# Patient Record
Sex: Female | Born: 1955 | Race: White | Hispanic: No | Marital: Single | Smoking: Never smoker
Health system: Southern US, Community
[De-identification: ages and names within clinical notes are randomized; demographics above are authoritative.]

## PROBLEM LIST (undated history)

## (undated) DIAGNOSIS — IMO0001 Reserved for inherently not codable concepts without codable children: Secondary | ICD-10-CM

## (undated) DIAGNOSIS — I209 Angina pectoris, unspecified: Secondary | ICD-10-CM

## (undated) DIAGNOSIS — K5900 Constipation, unspecified: Secondary | ICD-10-CM

## (undated) DIAGNOSIS — T4145XA Adverse effect of unspecified anesthetic, initial encounter: Secondary | ICD-10-CM

## (undated) DIAGNOSIS — M1990 Unspecified osteoarthritis, unspecified site: Secondary | ICD-10-CM

## (undated) DIAGNOSIS — K259 Gastric ulcer, unspecified as acute or chronic, without hemorrhage or perforation: Secondary | ICD-10-CM

## (undated) DIAGNOSIS — Z8614 Personal history of Methicillin resistant Staphylococcus aureus infection: Secondary | ICD-10-CM

## (undated) DIAGNOSIS — F32A Depression, unspecified: Secondary | ICD-10-CM

## (undated) DIAGNOSIS — R0602 Shortness of breath: Secondary | ICD-10-CM

## (undated) DIAGNOSIS — Z5189 Encounter for other specified aftercare: Secondary | ICD-10-CM

## (undated) DIAGNOSIS — N19 Unspecified kidney failure: Secondary | ICD-10-CM

## (undated) DIAGNOSIS — E669 Obesity, unspecified: Secondary | ICD-10-CM

## (undated) DIAGNOSIS — F79 Unspecified intellectual disabilities: Secondary | ICD-10-CM

## (undated) DIAGNOSIS — F329 Major depressive disorder, single episode, unspecified: Secondary | ICD-10-CM

## (undated) DIAGNOSIS — J189 Pneumonia, unspecified organism: Secondary | ICD-10-CM

## (undated) DIAGNOSIS — G4730 Sleep apnea, unspecified: Secondary | ICD-10-CM

## (undated) DIAGNOSIS — Z950 Presence of cardiac pacemaker: Secondary | ICD-10-CM

## (undated) DIAGNOSIS — Z992 Dependence on renal dialysis: Secondary | ICD-10-CM

## (undated) DIAGNOSIS — R569 Unspecified convulsions: Secondary | ICD-10-CM

## (undated) DIAGNOSIS — Q851 Tuberous sclerosis: Secondary | ICD-10-CM

## (undated) DIAGNOSIS — K219 Gastro-esophageal reflux disease without esophagitis: Secondary | ICD-10-CM

## (undated) DIAGNOSIS — D649 Anemia, unspecified: Secondary | ICD-10-CM

## (undated) DIAGNOSIS — I10 Essential (primary) hypertension: Secondary | ICD-10-CM

## (undated) DIAGNOSIS — I509 Heart failure, unspecified: Secondary | ICD-10-CM

## (undated) DIAGNOSIS — T8859XA Other complications of anesthesia, initial encounter: Secondary | ICD-10-CM

## (undated) DIAGNOSIS — N289 Disorder of kidney and ureter, unspecified: Secondary | ICD-10-CM

## (undated) DIAGNOSIS — N186 End stage renal disease: Secondary | ICD-10-CM

## (undated) DIAGNOSIS — G40909 Epilepsy, unspecified, not intractable, without status epilepticus: Secondary | ICD-10-CM

## (undated) HISTORY — PX: PACEMAKER PLACEMENT: SHX43

## (undated) HISTORY — DX: Tuberous sclerosis: Q85.1

## (undated) HISTORY — DX: Personal history of Methicillin resistant Staphylococcus aureus infection: Z86.14

## (undated) HISTORY — DX: Unspecified convulsions: R56.9

## (undated) HISTORY — DX: Depression, unspecified: F32.A

## (undated) HISTORY — DX: Obesity, unspecified: E66.9

## (undated) HISTORY — PX: AV FISTULA PLACEMENT: SHX1204

## (undated) HISTORY — PX: VAS INS TUNN CV CATH 5 OR OLDER (ARMC HX): HXRAD1272

## (undated) HISTORY — PX: OTHER SURGICAL HISTORY: SHX169

## (undated) HISTORY — PX: NEPHRECTOMY: SHX65

## (undated) HISTORY — DX: Gastro-esophageal reflux disease without esophagitis: K21.9

## (undated) HISTORY — DX: Major depressive disorder, single episode, unspecified: F32.9

## (undated) HISTORY — PX: ARTERIOVENOUS GRAFT PLACEMENT: SUR1029

---
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
## 1978-03-03 HISTORY — PX: TONSILLECTOMY: SUR1361

## 1990-07-02 HISTORY — PX: CHOLECYSTECTOMY: SHX55

## 1998-01-25 ENCOUNTER — Emergency Department (HOSPITAL_COMMUNITY): Admission: EM | Admit: 1998-01-25 | Discharge: 1998-01-26 | Payer: Self-pay | Admitting: Emergency Medicine

## 1999-10-17 ENCOUNTER — Encounter: Admission: RE | Admit: 1999-10-17 | Discharge: 1999-10-17 | Payer: Self-pay | Admitting: Obstetrics & Gynecology

## 1999-11-14 ENCOUNTER — Encounter: Admission: RE | Admit: 1999-11-14 | Discharge: 1999-11-14 | Payer: Self-pay | Admitting: Obstetrics & Gynecology

## 1999-12-19 ENCOUNTER — Encounter: Admission: RE | Admit: 1999-12-19 | Discharge: 1999-12-19 | Payer: Self-pay | Admitting: Obstetrics

## 2001-05-15 ENCOUNTER — Encounter: Admission: RE | Admit: 2001-05-15 | Discharge: 2001-08-13 | Payer: Self-pay | Admitting: Internal Medicine

## 2001-09-08 ENCOUNTER — Encounter: Admission: RE | Admit: 2001-09-08 | Discharge: 2001-12-07 | Payer: Self-pay | Admitting: Internal Medicine

## 2002-02-17 ENCOUNTER — Encounter: Payer: Self-pay | Admitting: Internal Medicine

## 2002-02-17 ENCOUNTER — Encounter: Admission: RE | Admit: 2002-02-17 | Discharge: 2002-02-17 | Payer: Self-pay | Admitting: Internal Medicine

## 2002-06-01 ENCOUNTER — Ambulatory Visit (HOSPITAL_BASED_OUTPATIENT_CLINIC_OR_DEPARTMENT_OTHER): Admission: RE | Admit: 2002-06-01 | Discharge: 2002-06-01 | Payer: Self-pay | Admitting: Otolaryngology

## 2004-05-29 ENCOUNTER — Ambulatory Visit: Payer: Self-pay | Admitting: Internal Medicine

## 2004-06-29 ENCOUNTER — Ambulatory Visit: Payer: Self-pay | Admitting: Internal Medicine

## 2004-07-03 ENCOUNTER — Encounter: Admission: RE | Admit: 2004-07-03 | Discharge: 2004-07-03 | Payer: Self-pay | Admitting: Internal Medicine

## 2004-08-25 ENCOUNTER — Ambulatory Visit: Payer: Self-pay | Admitting: Internal Medicine

## 2004-10-20 ENCOUNTER — Emergency Department (HOSPITAL_COMMUNITY): Admission: EM | Admit: 2004-10-20 | Discharge: 2004-10-20 | Payer: Self-pay | Admitting: Emergency Medicine

## 2004-10-25 ENCOUNTER — Ambulatory Visit: Payer: Self-pay | Admitting: Internal Medicine

## 2004-11-23 ENCOUNTER — Ambulatory Visit: Payer: Self-pay | Admitting: Licensed Clinical Social Worker

## 2004-12-06 ENCOUNTER — Ambulatory Visit: Payer: Self-pay | Admitting: Licensed Clinical Social Worker

## 2004-12-20 ENCOUNTER — Ambulatory Visit: Payer: Self-pay | Admitting: Licensed Clinical Social Worker

## 2005-01-03 ENCOUNTER — Ambulatory Visit: Payer: Self-pay | Admitting: Licensed Clinical Social Worker

## 2005-01-17 ENCOUNTER — Ambulatory Visit: Payer: Self-pay | Admitting: Licensed Clinical Social Worker

## 2005-01-31 ENCOUNTER — Ambulatory Visit: Payer: Self-pay | Admitting: Licensed Clinical Social Worker

## 2005-02-06 ENCOUNTER — Ambulatory Visit: Payer: Self-pay | Admitting: Internal Medicine

## 2005-02-07 ENCOUNTER — Ambulatory Visit: Payer: Self-pay | Admitting: Licensed Clinical Social Worker

## 2005-02-16 ENCOUNTER — Ambulatory Visit: Payer: Self-pay | Admitting: Internal Medicine

## 2005-02-21 ENCOUNTER — Ambulatory Visit: Payer: Self-pay | Admitting: Licensed Clinical Social Worker

## 2005-03-07 ENCOUNTER — Ambulatory Visit: Payer: Self-pay | Admitting: Licensed Clinical Social Worker

## 2005-03-21 ENCOUNTER — Ambulatory Visit: Payer: Self-pay | Admitting: Licensed Clinical Social Worker

## 2005-04-04 ENCOUNTER — Ambulatory Visit: Payer: Self-pay | Admitting: Licensed Clinical Social Worker

## 2005-04-18 ENCOUNTER — Ambulatory Visit: Payer: Self-pay | Admitting: Licensed Clinical Social Worker

## 2005-05-09 ENCOUNTER — Ambulatory Visit: Payer: Self-pay | Admitting: Licensed Clinical Social Worker

## 2005-05-09 ENCOUNTER — Ambulatory Visit: Payer: Self-pay | Admitting: Internal Medicine

## 2005-05-24 ENCOUNTER — Ambulatory Visit: Payer: Self-pay | Admitting: Licensed Clinical Social Worker

## 2005-05-30 ENCOUNTER — Ambulatory Visit: Payer: Self-pay | Admitting: Licensed Clinical Social Worker

## 2005-06-13 ENCOUNTER — Ambulatory Visit: Payer: Self-pay | Admitting: Licensed Clinical Social Worker

## 2005-06-27 ENCOUNTER — Ambulatory Visit: Payer: Self-pay | Admitting: Licensed Clinical Social Worker

## 2005-07-18 ENCOUNTER — Ambulatory Visit: Payer: Self-pay | Admitting: Licensed Clinical Social Worker

## 2005-08-01 ENCOUNTER — Ambulatory Visit: Payer: Self-pay | Admitting: Licensed Clinical Social Worker

## 2005-08-08 ENCOUNTER — Ambulatory Visit: Payer: Self-pay | Admitting: Internal Medicine

## 2005-08-15 ENCOUNTER — Ambulatory Visit: Payer: Self-pay | Admitting: Licensed Clinical Social Worker

## 2005-08-29 ENCOUNTER — Ambulatory Visit: Payer: Self-pay | Admitting: Licensed Clinical Social Worker

## 2005-09-05 ENCOUNTER — Emergency Department (HOSPITAL_COMMUNITY): Admission: EM | Admit: 2005-09-05 | Discharge: 2005-09-05 | Payer: Self-pay | Admitting: *Deleted

## 2005-09-12 ENCOUNTER — Ambulatory Visit: Payer: Self-pay | Admitting: Licensed Clinical Social Worker

## 2005-09-13 ENCOUNTER — Ambulatory Visit: Payer: Self-pay | Admitting: Internal Medicine

## 2005-09-26 ENCOUNTER — Ambulatory Visit: Payer: Self-pay | Admitting: Licensed Clinical Social Worker

## 2005-10-10 ENCOUNTER — Ambulatory Visit: Payer: Self-pay | Admitting: Licensed Clinical Social Worker

## 2005-10-11 ENCOUNTER — Ambulatory Visit: Payer: Self-pay | Admitting: Internal Medicine

## 2005-10-16 ENCOUNTER — Ambulatory Visit: Payer: Self-pay | Admitting: Internal Medicine

## 2005-11-01 ENCOUNTER — Ambulatory Visit: Payer: Self-pay | Admitting: Licensed Clinical Social Worker

## 2005-11-06 ENCOUNTER — Ambulatory Visit: Payer: Self-pay | Admitting: Internal Medicine

## 2005-11-08 ENCOUNTER — Inpatient Hospital Stay (HOSPITAL_COMMUNITY): Admission: EM | Admit: 2005-11-08 | Discharge: 2005-11-14 | Payer: Self-pay | Admitting: Emergency Medicine

## 2005-11-08 ENCOUNTER — Ambulatory Visit: Payer: Self-pay | Admitting: Family Medicine

## 2005-11-09 ENCOUNTER — Encounter: Payer: Self-pay | Admitting: Vascular Surgery

## 2005-11-15 ENCOUNTER — Ambulatory Visit: Payer: Self-pay | Admitting: Licensed Clinical Social Worker

## 2005-11-19 ENCOUNTER — Ambulatory Visit: Payer: Self-pay | Admitting: Internal Medicine

## 2005-11-23 ENCOUNTER — Encounter: Admission: RE | Admit: 2005-11-23 | Discharge: 2005-11-23 | Payer: Self-pay | Admitting: Internal Medicine

## 2005-11-29 ENCOUNTER — Ambulatory Visit: Payer: Self-pay | Admitting: Licensed Clinical Social Worker

## 2005-11-30 ENCOUNTER — Ambulatory Visit: Payer: Self-pay | Admitting: Licensed Clinical Social Worker

## 2005-12-03 ENCOUNTER — Ambulatory Visit: Payer: Self-pay | Admitting: Licensed Clinical Social Worker

## 2005-12-17 ENCOUNTER — Encounter: Admission: RE | Admit: 2005-12-17 | Discharge: 2005-12-17 | Payer: Self-pay | Admitting: Gastroenterology

## 2005-12-19 ENCOUNTER — Ambulatory Visit: Payer: Self-pay | Admitting: Internal Medicine

## 2005-12-20 ENCOUNTER — Ambulatory Visit: Payer: Self-pay | Admitting: Licensed Clinical Social Worker

## 2005-12-27 ENCOUNTER — Ambulatory Visit: Payer: Self-pay | Admitting: Licensed Clinical Social Worker

## 2006-01-03 ENCOUNTER — Ambulatory Visit: Payer: Self-pay | Admitting: Licensed Clinical Social Worker

## 2006-01-08 ENCOUNTER — Ambulatory Visit: Payer: Self-pay | Admitting: Internal Medicine

## 2006-01-08 ENCOUNTER — Ambulatory Visit: Payer: Self-pay | Admitting: Licensed Clinical Social Worker

## 2006-01-28 ENCOUNTER — Ambulatory Visit (HOSPITAL_COMMUNITY): Admission: RE | Admit: 2006-01-28 | Discharge: 2006-01-28 | Payer: Self-pay | Admitting: Nephrology

## 2006-01-30 ENCOUNTER — Ambulatory Visit (HOSPITAL_COMMUNITY): Admission: RE | Admit: 2006-01-30 | Discharge: 2006-01-30 | Payer: Self-pay | Admitting: Nephrology

## 2006-02-01 ENCOUNTER — Ambulatory Visit (HOSPITAL_COMMUNITY): Admission: RE | Admit: 2006-02-01 | Discharge: 2006-02-01 | Payer: Self-pay | Admitting: Vascular Surgery

## 2006-02-09 ENCOUNTER — Emergency Department (HOSPITAL_COMMUNITY): Admission: EM | Admit: 2006-02-09 | Discharge: 2006-02-10 | Payer: Self-pay | Admitting: Emergency Medicine

## 2006-02-15 ENCOUNTER — Ambulatory Visit: Payer: Self-pay | Admitting: Internal Medicine

## 2006-02-23 ENCOUNTER — Emergency Department (HOSPITAL_COMMUNITY): Admission: EM | Admit: 2006-02-23 | Discharge: 2006-02-23 | Payer: Self-pay | Admitting: Emergency Medicine

## 2006-03-02 ENCOUNTER — Emergency Department (HOSPITAL_COMMUNITY): Admission: EM | Admit: 2006-03-02 | Discharge: 2006-03-02 | Payer: Self-pay | Admitting: Emergency Medicine

## 2006-03-08 ENCOUNTER — Ambulatory Visit: Payer: Self-pay | Admitting: Internal Medicine

## 2006-03-13 ENCOUNTER — Ambulatory Visit: Payer: Self-pay | Admitting: Licensed Clinical Social Worker

## 2006-03-27 ENCOUNTER — Ambulatory Visit: Payer: Self-pay | Admitting: Licensed Clinical Social Worker

## 2006-03-27 ENCOUNTER — Ambulatory Visit: Payer: Self-pay | Admitting: Internal Medicine

## 2006-04-05 ENCOUNTER — Ambulatory Visit: Payer: Self-pay | Admitting: Internal Medicine

## 2006-04-08 ENCOUNTER — Ambulatory Visit: Payer: Self-pay | Admitting: Licensed Clinical Social Worker

## 2006-04-12 ENCOUNTER — Ambulatory Visit (HOSPITAL_COMMUNITY): Admission: RE | Admit: 2006-04-12 | Discharge: 2006-04-12 | Payer: Self-pay | Admitting: Vascular Surgery

## 2006-04-15 ENCOUNTER — Ambulatory Visit (HOSPITAL_COMMUNITY): Admission: RE | Admit: 2006-04-15 | Discharge: 2006-04-15 | Payer: Self-pay | Admitting: Gastroenterology

## 2006-04-15 ENCOUNTER — Ambulatory Visit: Payer: Self-pay | Admitting: Internal Medicine

## 2006-04-15 ENCOUNTER — Inpatient Hospital Stay (HOSPITAL_COMMUNITY): Admission: EM | Admit: 2006-04-15 | Discharge: 2006-04-25 | Payer: Self-pay | Admitting: Emergency Medicine

## 2006-05-01 ENCOUNTER — Ambulatory Visit: Payer: Self-pay | Admitting: Internal Medicine

## 2006-05-01 ENCOUNTER — Emergency Department (HOSPITAL_COMMUNITY): Admission: EM | Admit: 2006-05-01 | Discharge: 2006-05-02 | Payer: Self-pay | Admitting: Emergency Medicine

## 2006-05-03 ENCOUNTER — Inpatient Hospital Stay (HOSPITAL_COMMUNITY): Admission: AD | Admit: 2006-05-03 | Discharge: 2006-05-10 | Payer: Self-pay | Admitting: Vascular Surgery

## 2006-05-03 ENCOUNTER — Encounter: Payer: Self-pay | Admitting: Vascular Surgery

## 2006-05-17 ENCOUNTER — Ambulatory Visit: Payer: Self-pay | Admitting: Internal Medicine

## 2006-05-19 ENCOUNTER — Emergency Department (HOSPITAL_COMMUNITY): Admission: EM | Admit: 2006-05-19 | Discharge: 2006-05-19 | Payer: Self-pay | Admitting: *Deleted

## 2006-05-19 ENCOUNTER — Emergency Department (HOSPITAL_COMMUNITY): Admission: EM | Admit: 2006-05-19 | Discharge: 2006-05-19 | Payer: Self-pay | Admitting: Emergency Medicine

## 2006-05-24 ENCOUNTER — Ambulatory Visit: Payer: Self-pay | Admitting: Licensed Clinical Social Worker

## 2006-05-27 ENCOUNTER — Ambulatory Visit: Payer: Self-pay | Admitting: Internal Medicine

## 2006-05-30 ENCOUNTER — Emergency Department (HOSPITAL_COMMUNITY): Admission: EM | Admit: 2006-05-30 | Discharge: 2006-05-30 | Payer: Self-pay | Admitting: Emergency Medicine

## 2006-06-05 ENCOUNTER — Ambulatory Visit: Payer: Self-pay | Admitting: Licensed Clinical Social Worker

## 2006-06-10 ENCOUNTER — Ambulatory Visit (HOSPITAL_COMMUNITY): Admission: RE | Admit: 2006-06-10 | Discharge: 2006-06-10 | Payer: Self-pay | Admitting: Vascular Surgery

## 2006-06-17 ENCOUNTER — Ambulatory Visit: Payer: Self-pay | Admitting: Licensed Clinical Social Worker

## 2006-07-01 ENCOUNTER — Ambulatory Visit: Payer: Self-pay | Admitting: Licensed Clinical Social Worker

## 2006-07-01 ENCOUNTER — Ambulatory Visit: Payer: Self-pay | Admitting: Internal Medicine

## 2006-07-04 IMAGING — US US RETROPERITONEAL COMPLETE
1 series · 10 of 10 positions shown · non-contrast
Comparison: none

CLINICAL DATA: Chronic renal failure.  Previous right nephrectomy for renal adenomas. 
RENAL ULTRASOUND:
Multiple scans of the region of the left kidney are made and are correlated with the previous CT scan of 02/17/02 from [REDACTED] and show the area of the kidney now to be very difficult to marginate on ultrasound and it appears that the numerous fatty tumors of the kidney are also now involving the left kidney and distorting the kidney.  The collecting system can be visualized but not the margins of the kidney which is markedly enlarged as was seen on the CT scan of 02/17/02.  The bladder is partially filled and appears normal. The right kidney is known to have been removed.

[Series 1: unknown · 0.32mm/px · 10 of 10 slices shown]
[im 1/10]
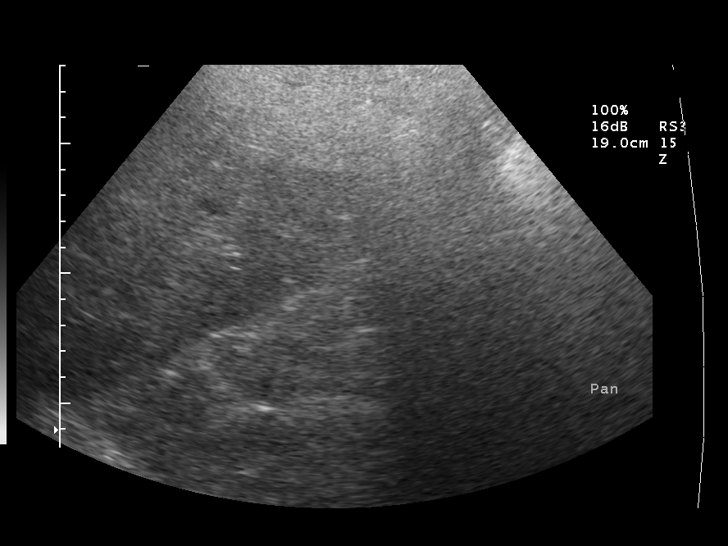
[im 2/10]
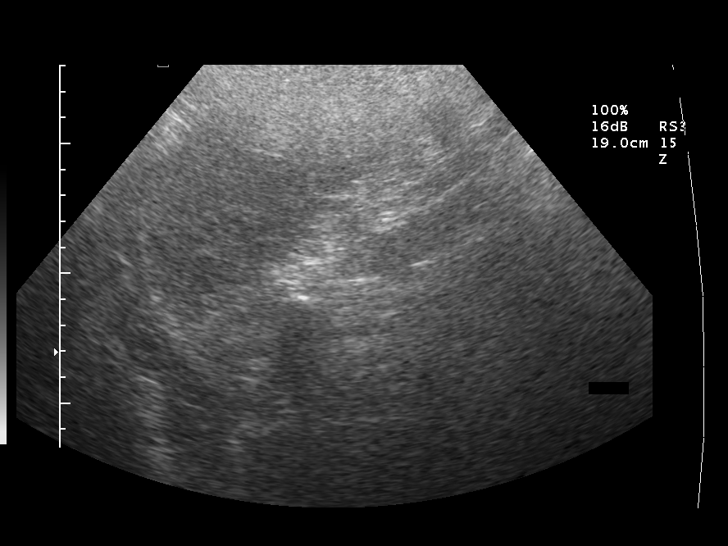
[im 3/10]
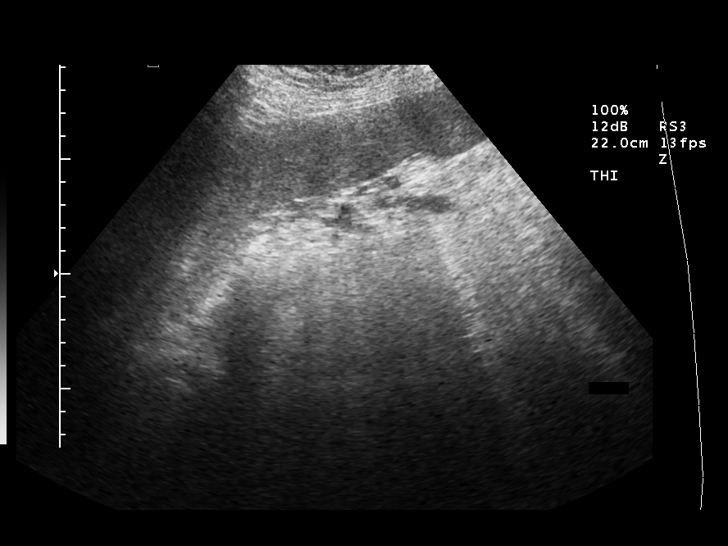
[im 4/10]
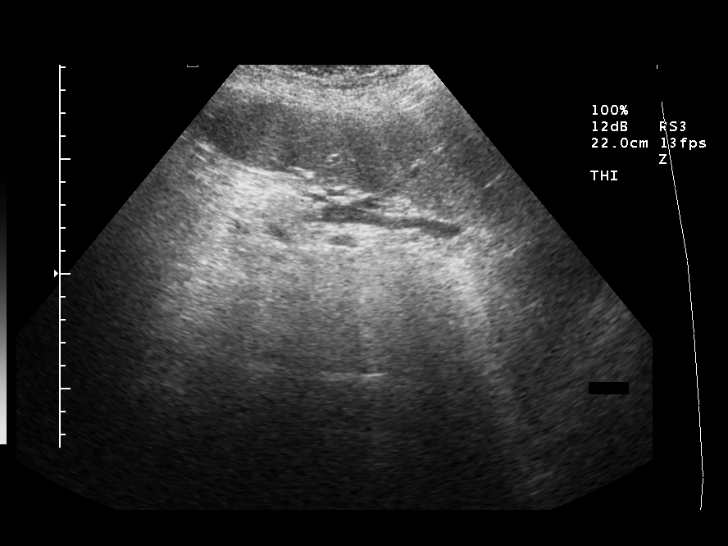
[im 5/10]
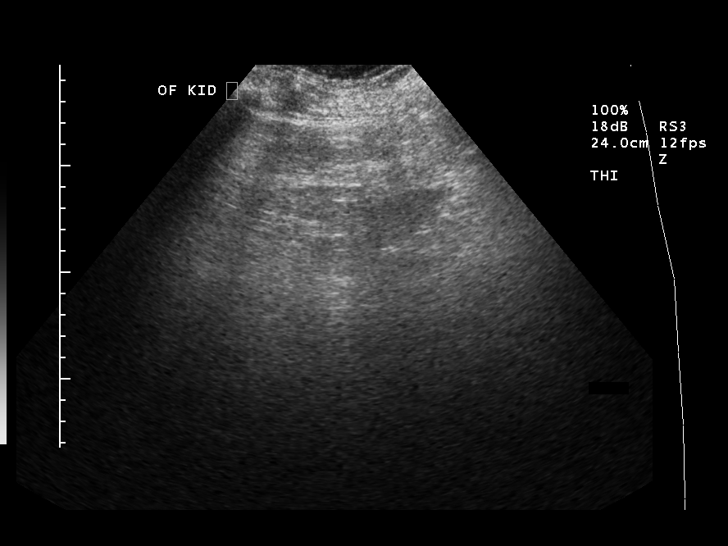
[im 6/10]
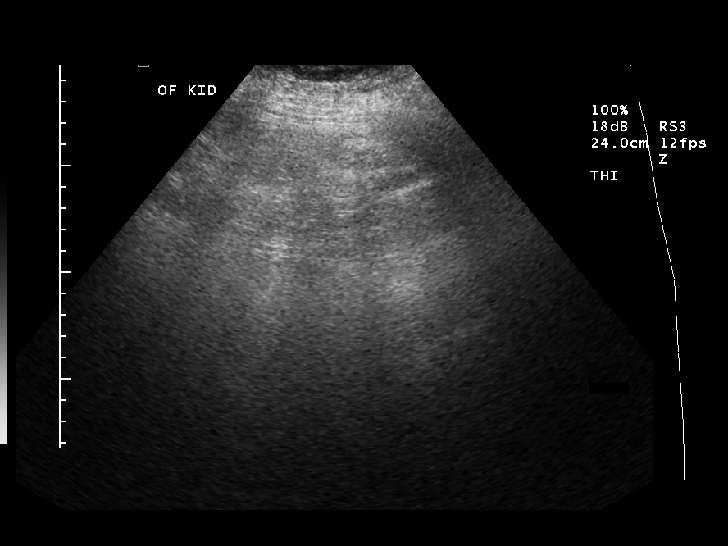
[im 7/10]
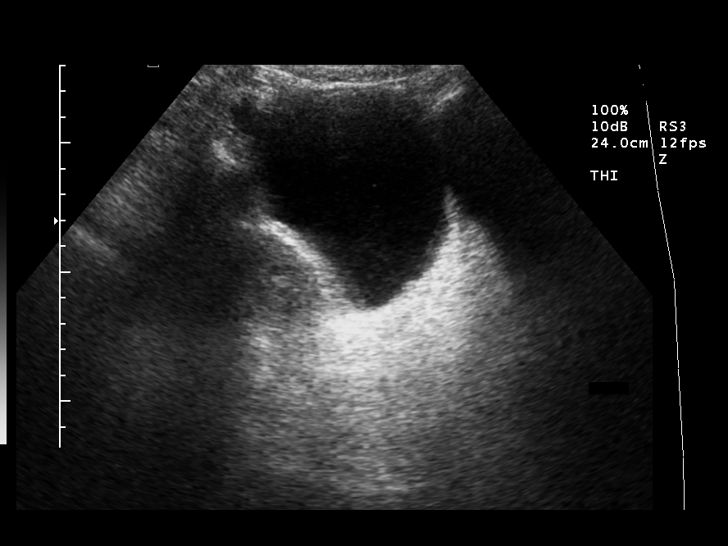
[im 8/10]
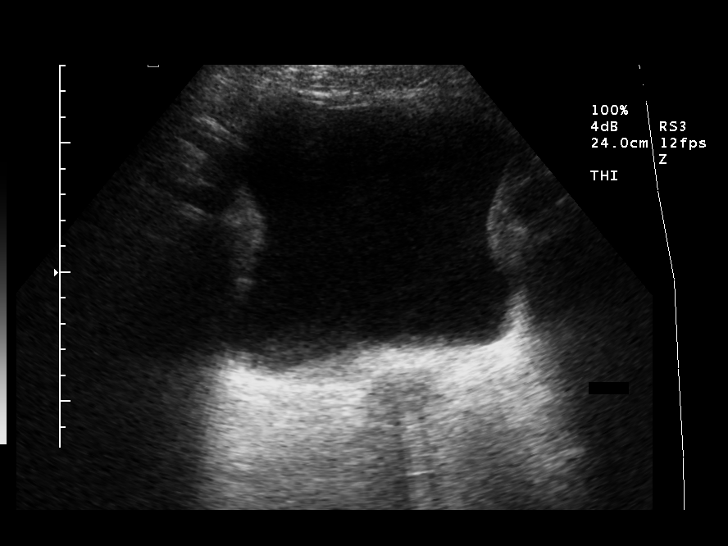
[im 9/10]
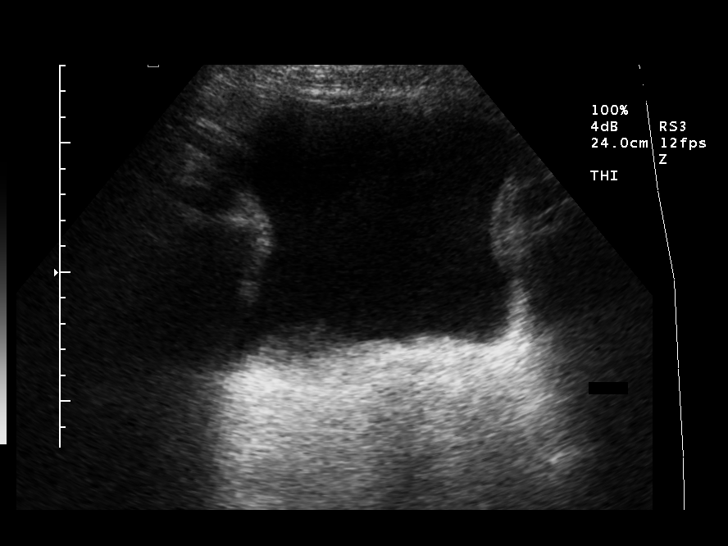
[im 10/10]
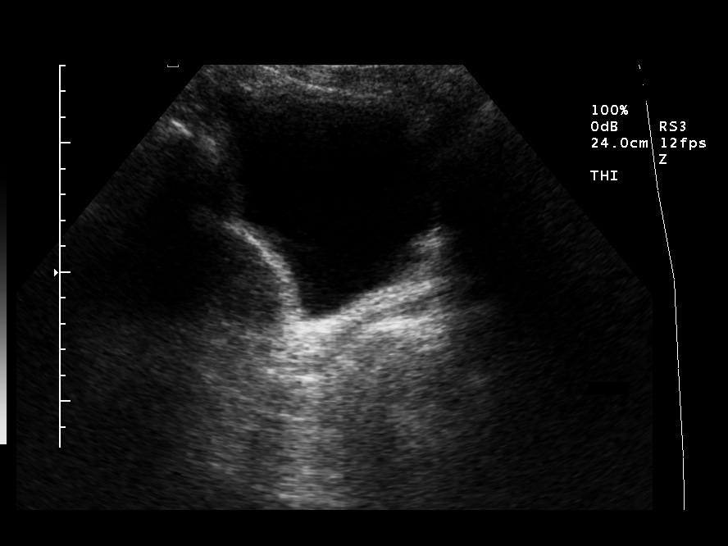

[10 of 10 positions shown; findings below may reference images not displayed]

IMPRESSION: Markedly distorted kidney with what appear to be numerous fatty tumors on the left side. The margins of the kidney cannot be measured but correlate relatively well with the CT scan of 02/17/02 from [REDACTED] and probably are multiple fatty adenoma lesions associated with the left kidney. There is no definite hydronephrosis or hydroureter.  The bladder is partially filled and appears normal.

## 2006-07-15 ENCOUNTER — Ambulatory Visit: Payer: Self-pay | Admitting: Licensed Clinical Social Worker

## 2006-07-29 ENCOUNTER — Ambulatory Visit: Payer: Self-pay | Admitting: Licensed Clinical Social Worker

## 2006-08-06 ENCOUNTER — Emergency Department (HOSPITAL_COMMUNITY): Admission: EM | Admit: 2006-08-06 | Discharge: 2006-08-07 | Payer: Self-pay | Admitting: Emergency Medicine

## 2006-08-12 ENCOUNTER — Ambulatory Visit: Payer: Self-pay | Admitting: Licensed Clinical Social Worker

## 2006-08-18 ENCOUNTER — Emergency Department (HOSPITAL_COMMUNITY): Admission: EM | Admit: 2006-08-18 | Discharge: 2006-08-18 | Payer: Self-pay | Admitting: Emergency Medicine

## 2006-08-26 ENCOUNTER — Ambulatory Visit: Payer: Self-pay | Admitting: Internal Medicine

## 2006-08-26 ENCOUNTER — Ambulatory Visit: Payer: Self-pay | Admitting: Licensed Clinical Social Worker

## 2006-08-31 ENCOUNTER — Emergency Department (HOSPITAL_COMMUNITY): Admission: EM | Admit: 2006-08-31 | Discharge: 2006-08-31 | Payer: Self-pay | Admitting: Emergency Medicine

## 2006-09-01 ENCOUNTER — Emergency Department (HOSPITAL_COMMUNITY): Admission: EM | Admit: 2006-09-01 | Discharge: 2006-09-01 | Payer: Self-pay | Admitting: Emergency Medicine

## 2006-09-04 ENCOUNTER — Ambulatory Visit (HOSPITAL_COMMUNITY): Admission: RE | Admit: 2006-09-04 | Discharge: 2006-09-04 | Payer: Self-pay | Admitting: Nephrology

## 2006-09-10 ENCOUNTER — Emergency Department (HOSPITAL_COMMUNITY): Admission: EM | Admit: 2006-09-10 | Discharge: 2006-09-11 | Payer: Self-pay | Admitting: Emergency Medicine

## 2006-09-10 ENCOUNTER — Encounter: Payer: Self-pay | Admitting: Internal Medicine

## 2006-09-16 ENCOUNTER — Ambulatory Visit (HOSPITAL_COMMUNITY): Admission: RE | Admit: 2006-09-16 | Discharge: 2006-09-16 | Payer: Self-pay | Admitting: Nephrology

## 2006-09-23 ENCOUNTER — Ambulatory Visit: Payer: Self-pay | Admitting: Licensed Clinical Social Worker

## 2006-09-30 ENCOUNTER — Ambulatory Visit: Payer: Self-pay | Admitting: Internal Medicine

## 2006-10-14 ENCOUNTER — Ambulatory Visit: Payer: Self-pay | Admitting: Licensed Clinical Social Worker

## 2006-10-28 ENCOUNTER — Ambulatory Visit: Payer: Self-pay | Admitting: Licensed Clinical Social Worker

## 2006-11-11 ENCOUNTER — Ambulatory Visit: Payer: Self-pay | Admitting: Licensed Clinical Social Worker

## 2006-11-22 ENCOUNTER — Ambulatory Visit: Payer: Self-pay | Admitting: Internal Medicine

## 2006-11-25 ENCOUNTER — Ambulatory Visit: Payer: Self-pay | Admitting: Licensed Clinical Social Worker

## 2006-12-06 ENCOUNTER — Ambulatory Visit (HOSPITAL_COMMUNITY): Admission: RE | Admit: 2006-12-06 | Discharge: 2006-12-06 | Payer: Self-pay | Admitting: Nephrology

## 2006-12-11 ENCOUNTER — Ambulatory Visit: Payer: Self-pay | Admitting: Licensed Clinical Social Worker

## 2006-12-11 ENCOUNTER — Ambulatory Visit: Payer: Self-pay | Admitting: Internal Medicine

## 2006-12-24 ENCOUNTER — Ambulatory Visit: Payer: Self-pay | Admitting: Vascular Surgery

## 2006-12-25 ENCOUNTER — Ambulatory Visit: Payer: Self-pay | Admitting: Licensed Clinical Social Worker

## 2006-12-27 ENCOUNTER — Ambulatory Visit: Payer: Self-pay | Admitting: Vascular Surgery

## 2006-12-28 ENCOUNTER — Inpatient Hospital Stay (HOSPITAL_COMMUNITY): Admission: RE | Admit: 2006-12-28 | Discharge: 2007-01-01 | Payer: Self-pay | Admitting: Vascular Surgery

## 2007-01-07 DIAGNOSIS — J45909 Unspecified asthma, uncomplicated: Secondary | ICD-10-CM

## 2007-01-07 DIAGNOSIS — G40209 Localization-related (focal) (partial) symptomatic epilepsy and epileptic syndromes with complex partial seizures, not intractable, without status epilepticus: Secondary | ICD-10-CM | POA: Insufficient documentation

## 2007-01-07 DIAGNOSIS — F79 Unspecified intellectual disabilities: Secondary | ICD-10-CM | POA: Insufficient documentation

## 2007-01-07 DIAGNOSIS — Q851 Tuberous sclerosis: Secondary | ICD-10-CM

## 2007-01-08 ENCOUNTER — Emergency Department (HOSPITAL_COMMUNITY): Admission: EM | Admit: 2007-01-08 | Discharge: 2007-01-09 | Payer: Self-pay | Admitting: Emergency Medicine

## 2007-01-08 ENCOUNTER — Ambulatory Visit: Payer: Self-pay | Admitting: Licensed Clinical Social Worker

## 2007-01-10 ENCOUNTER — Emergency Department (HOSPITAL_COMMUNITY): Admission: EM | Admit: 2007-01-10 | Discharge: 2007-01-11 | Payer: Self-pay | Admitting: Emergency Medicine

## 2007-01-13 ENCOUNTER — Emergency Department (HOSPITAL_COMMUNITY): Admission: EM | Admit: 2007-01-13 | Discharge: 2007-01-13 | Payer: Self-pay | Admitting: Emergency Medicine

## 2007-01-14 ENCOUNTER — Ambulatory Visit (HOSPITAL_COMMUNITY): Admission: RE | Admit: 2007-01-14 | Discharge: 2007-01-14 | Payer: Self-pay | Admitting: Vascular Surgery

## 2007-01-22 ENCOUNTER — Ambulatory Visit: Payer: Self-pay | Admitting: Internal Medicine

## 2007-01-22 ENCOUNTER — Encounter: Payer: Self-pay | Admitting: Internal Medicine

## 2007-01-22 ENCOUNTER — Ambulatory Visit: Payer: Self-pay | Admitting: Licensed Clinical Social Worker

## 2007-01-22 DIAGNOSIS — N186 End stage renal disease: Secondary | ICD-10-CM

## 2007-01-30 ENCOUNTER — Emergency Department (HOSPITAL_COMMUNITY): Admission: EM | Admit: 2007-01-30 | Discharge: 2007-01-30 | Payer: Self-pay | Admitting: Emergency Medicine

## 2007-02-05 ENCOUNTER — Ambulatory Visit: Payer: Self-pay | Admitting: Licensed Clinical Social Worker

## 2007-02-06 ENCOUNTER — Emergency Department (HOSPITAL_COMMUNITY): Admission: EM | Admit: 2007-02-06 | Discharge: 2007-02-06 | Payer: Self-pay | Admitting: Emergency Medicine

## 2007-02-19 ENCOUNTER — Ambulatory Visit: Payer: Self-pay | Admitting: Licensed Clinical Social Worker

## 2007-02-22 ENCOUNTER — Emergency Department (HOSPITAL_COMMUNITY): Admission: EM | Admit: 2007-02-22 | Discharge: 2007-02-22 | Payer: Self-pay | Admitting: Emergency Medicine

## 2007-02-24 ENCOUNTER — Telehealth: Payer: Self-pay | Admitting: Internal Medicine

## 2007-03-03 ENCOUNTER — Ambulatory Visit: Payer: Self-pay | Admitting: Internal Medicine

## 2007-03-03 ENCOUNTER — Ambulatory Visit: Payer: Self-pay | Admitting: Licensed Clinical Social Worker

## 2007-03-16 ENCOUNTER — Ambulatory Visit (HOSPITAL_COMMUNITY): Admission: EM | Admit: 2007-03-16 | Discharge: 2007-03-16 | Payer: Self-pay | Admitting: Emergency Medicine

## 2007-03-17 ENCOUNTER — Ambulatory Visit (HOSPITAL_COMMUNITY): Admission: RE | Admit: 2007-03-17 | Discharge: 2007-03-17 | Payer: Self-pay | Admitting: Surgery

## 2007-03-17 ENCOUNTER — Ambulatory Visit: Payer: Self-pay | Admitting: Surgery

## 2007-03-18 ENCOUNTER — Emergency Department (HOSPITAL_COMMUNITY): Admission: EM | Admit: 2007-03-18 | Discharge: 2007-03-19 | Payer: Self-pay | Admitting: Emergency Medicine

## 2007-03-18 ENCOUNTER — Ambulatory Visit (HOSPITAL_COMMUNITY): Admission: RE | Admit: 2007-03-18 | Discharge: 2007-03-18 | Payer: Self-pay | Admitting: Surgery

## 2007-03-20 ENCOUNTER — Telehealth: Payer: Self-pay | Admitting: Internal Medicine

## 2007-03-22 ENCOUNTER — Emergency Department (HOSPITAL_COMMUNITY): Admission: EM | Admit: 2007-03-22 | Discharge: 2007-03-24 | Payer: Self-pay | Admitting: Emergency Medicine

## 2007-03-26 ENCOUNTER — Ambulatory Visit: Payer: Self-pay | Admitting: Internal Medicine

## 2007-03-28 ENCOUNTER — Ambulatory Visit (HOSPITAL_COMMUNITY): Admission: RE | Admit: 2007-03-28 | Discharge: 2007-03-28 | Payer: Self-pay | Admitting: Surgery

## 2007-04-02 ENCOUNTER — Ambulatory Visit: Payer: Self-pay | Admitting: Licensed Clinical Social Worker

## 2007-04-14 ENCOUNTER — Ambulatory Visit: Payer: Self-pay | Admitting: Internal Medicine

## 2007-04-14 ENCOUNTER — Ambulatory Visit: Payer: Self-pay | Admitting: Licensed Clinical Social Worker

## 2007-04-14 DIAGNOSIS — K5901 Slow transit constipation: Secondary | ICD-10-CM | POA: Insufficient documentation

## 2007-04-25 ENCOUNTER — Ambulatory Visit (HOSPITAL_COMMUNITY): Admission: RE | Admit: 2007-04-25 | Discharge: 2007-04-25 | Payer: Self-pay | Admitting: Nephrology

## 2007-04-28 ENCOUNTER — Ambulatory Visit: Payer: Self-pay | Admitting: Vascular Surgery

## 2007-04-28 ENCOUNTER — Ambulatory Visit (HOSPITAL_COMMUNITY): Admission: RE | Admit: 2007-04-28 | Discharge: 2007-04-28 | Payer: Self-pay | Admitting: Vascular Surgery

## 2007-05-01 ENCOUNTER — Ambulatory Visit: Payer: Self-pay | Admitting: Internal Medicine

## 2007-05-02 ENCOUNTER — Ambulatory Visit: Payer: Self-pay | Admitting: Licensed Clinical Social Worker

## 2007-05-07 ENCOUNTER — Ambulatory Visit (HOSPITAL_COMMUNITY): Admission: RE | Admit: 2007-05-07 | Discharge: 2007-05-07 | Payer: Self-pay | Admitting: Vascular Surgery

## 2007-05-13 ENCOUNTER — Telehealth: Payer: Self-pay | Admitting: Internal Medicine

## 2007-05-14 ENCOUNTER — Ambulatory Visit: Payer: Self-pay | Admitting: Licensed Clinical Social Worker

## 2007-05-14 ENCOUNTER — Telehealth: Payer: Self-pay | Admitting: *Deleted

## 2007-05-16 ENCOUNTER — Ambulatory Visit: Payer: Self-pay | Admitting: Vascular Surgery

## 2007-05-20 ENCOUNTER — Telehealth (INDEPENDENT_AMBULATORY_CARE_PROVIDER_SITE_OTHER): Payer: Self-pay | Admitting: *Deleted

## 2007-05-21 ENCOUNTER — Ambulatory Visit (HOSPITAL_COMMUNITY): Admission: RE | Admit: 2007-05-21 | Discharge: 2007-05-21 | Payer: Self-pay | Admitting: Surgery

## 2007-05-22 ENCOUNTER — Ambulatory Visit: Payer: Self-pay | Admitting: Internal Medicine

## 2007-05-26 ENCOUNTER — Telehealth: Payer: Self-pay | Admitting: Internal Medicine

## 2007-05-28 ENCOUNTER — Ambulatory Visit: Payer: Self-pay | Admitting: Licensed Clinical Social Worker

## 2007-06-02 ENCOUNTER — Encounter: Payer: Self-pay | Admitting: Internal Medicine

## 2007-06-09 ENCOUNTER — Telehealth: Payer: Self-pay | Admitting: Internal Medicine

## 2007-06-10 ENCOUNTER — Ambulatory Visit: Payer: Self-pay | Admitting: Licensed Clinical Social Worker

## 2007-06-16 ENCOUNTER — Telehealth: Payer: Self-pay | Admitting: Internal Medicine

## 2007-06-18 ENCOUNTER — Ambulatory Visit: Payer: Self-pay | Admitting: Vascular Surgery

## 2007-06-23 ENCOUNTER — Ambulatory Visit (HOSPITAL_COMMUNITY): Admission: RE | Admit: 2007-06-23 | Discharge: 2007-06-23 | Payer: Self-pay | Admitting: Vascular Surgery

## 2007-06-23 ENCOUNTER — Ambulatory Visit: Payer: Self-pay | Admitting: Vascular Surgery

## 2007-06-24 ENCOUNTER — Telehealth: Payer: Self-pay | Admitting: Internal Medicine

## 2007-06-25 ENCOUNTER — Telehealth: Payer: Self-pay | Admitting: Family Medicine

## 2007-06-25 ENCOUNTER — Encounter: Payer: Self-pay | Admitting: Internal Medicine

## 2007-06-25 ENCOUNTER — Ambulatory Visit: Payer: Self-pay | Admitting: Licensed Clinical Social Worker

## 2007-06-25 ENCOUNTER — Ambulatory Visit: Payer: Self-pay | Admitting: Family Medicine

## 2007-06-25 DIAGNOSIS — IMO0002 Reserved for concepts with insufficient information to code with codable children: Secondary | ICD-10-CM

## 2007-06-26 ENCOUNTER — Telehealth: Payer: Self-pay | Admitting: Internal Medicine

## 2007-06-27 ENCOUNTER — Telehealth: Payer: Self-pay | Admitting: Internal Medicine

## 2007-06-27 ENCOUNTER — Ambulatory Visit: Payer: Self-pay | Admitting: Vascular Surgery

## 2007-06-30 ENCOUNTER — Ambulatory Visit: Payer: Self-pay | Admitting: Internal Medicine

## 2007-06-30 DIAGNOSIS — K219 Gastro-esophageal reflux disease without esophagitis: Secondary | ICD-10-CM

## 2007-07-07 ENCOUNTER — Ambulatory Visit: Payer: Self-pay | Admitting: Licensed Clinical Social Worker

## 2007-07-08 ENCOUNTER — Telehealth: Payer: Self-pay | Admitting: Internal Medicine

## 2007-07-08 ENCOUNTER — Ambulatory Visit: Payer: Self-pay | Admitting: Internal Medicine

## 2007-07-14 ENCOUNTER — Ambulatory Visit: Payer: Self-pay | Admitting: Internal Medicine

## 2007-07-15 ENCOUNTER — Telehealth (INDEPENDENT_AMBULATORY_CARE_PROVIDER_SITE_OTHER): Payer: Self-pay | Admitting: *Deleted

## 2007-07-22 ENCOUNTER — Emergency Department (HOSPITAL_COMMUNITY): Admission: EM | Admit: 2007-07-22 | Discharge: 2007-07-22 | Payer: Self-pay | Admitting: Emergency Medicine

## 2007-07-23 ENCOUNTER — Ambulatory Visit: Payer: Self-pay | Admitting: Licensed Clinical Social Worker

## 2007-07-23 ENCOUNTER — Telehealth: Payer: Self-pay | Admitting: Internal Medicine

## 2007-07-30 ENCOUNTER — Ambulatory Visit (HOSPITAL_COMMUNITY): Admission: RE | Admit: 2007-07-30 | Discharge: 2007-07-30 | Payer: Self-pay | Admitting: Vascular Surgery

## 2007-07-30 ENCOUNTER — Ambulatory Visit: Payer: Self-pay | Admitting: *Deleted

## 2007-08-01 ENCOUNTER — Ambulatory Visit: Payer: Self-pay | Admitting: Family Medicine

## 2007-08-01 DIAGNOSIS — B354 Tinea corporis: Secondary | ICD-10-CM | POA: Insufficient documentation

## 2007-08-05 ENCOUNTER — Inpatient Hospital Stay (HOSPITAL_COMMUNITY): Admission: RE | Admit: 2007-08-05 | Discharge: 2007-08-08 | Payer: Self-pay | Admitting: Nephrology

## 2007-08-11 ENCOUNTER — Ambulatory Visit: Payer: Self-pay | Admitting: Licensed Clinical Social Worker

## 2007-08-11 ENCOUNTER — Telehealth: Payer: Self-pay | Admitting: Internal Medicine

## 2007-08-11 ENCOUNTER — Telehealth: Payer: Self-pay | Admitting: *Deleted

## 2007-08-11 ENCOUNTER — Telehealth (INDEPENDENT_AMBULATORY_CARE_PROVIDER_SITE_OTHER): Payer: Self-pay | Admitting: *Deleted

## 2007-08-12 ENCOUNTER — Emergency Department (HOSPITAL_COMMUNITY): Admission: EM | Admit: 2007-08-12 | Discharge: 2007-08-12 | Payer: Self-pay | Admitting: Emergency Medicine

## 2007-08-13 ENCOUNTER — Telehealth: Payer: Self-pay | Admitting: Internal Medicine

## 2007-08-15 ENCOUNTER — Telehealth: Payer: Self-pay | Admitting: Internal Medicine

## 2007-08-18 ENCOUNTER — Ambulatory Visit: Payer: Self-pay | Admitting: Internal Medicine

## 2007-08-19 ENCOUNTER — Ambulatory Visit (HOSPITAL_COMMUNITY): Admission: RE | Admit: 2007-08-19 | Discharge: 2007-08-19 | Payer: Self-pay | Admitting: Interventional Radiology

## 2007-08-22 ENCOUNTER — Ambulatory Visit: Payer: Self-pay | Admitting: Internal Medicine

## 2007-08-22 DIAGNOSIS — L240 Irritant contact dermatitis due to detergents: Secondary | ICD-10-CM | POA: Insufficient documentation

## 2007-08-25 ENCOUNTER — Ambulatory Visit: Payer: Self-pay | Admitting: Licensed Clinical Social Worker

## 2007-08-29 ENCOUNTER — Ambulatory Visit: Payer: Self-pay | Admitting: Vascular Surgery

## 2007-09-01 ENCOUNTER — Ambulatory Visit: Payer: Self-pay | Admitting: Internal Medicine

## 2007-09-03 ENCOUNTER — Emergency Department (HOSPITAL_COMMUNITY): Admission: EM | Admit: 2007-09-03 | Discharge: 2007-09-03 | Payer: Self-pay | Admitting: Emergency Medicine

## 2007-09-09 ENCOUNTER — Ambulatory Visit: Payer: Self-pay | Admitting: Licensed Clinical Social Worker

## 2007-09-12 ENCOUNTER — Telehealth: Payer: Self-pay | Admitting: Internal Medicine

## 2007-09-12 ENCOUNTER — Emergency Department (HOSPITAL_COMMUNITY): Admission: EM | Admit: 2007-09-12 | Discharge: 2007-09-12 | Payer: Self-pay | Admitting: Emergency Medicine

## 2007-09-19 ENCOUNTER — Telehealth: Payer: Self-pay | Admitting: Internal Medicine

## 2007-09-20 ENCOUNTER — Emergency Department (HOSPITAL_COMMUNITY): Admission: EM | Admit: 2007-09-20 | Discharge: 2007-09-20 | Payer: Self-pay | Admitting: Emergency Medicine

## 2007-09-21 ENCOUNTER — Inpatient Hospital Stay (HOSPITAL_COMMUNITY): Admission: EM | Admit: 2007-09-21 | Discharge: 2007-09-30 | Payer: Self-pay | Admitting: Emergency Medicine

## 2007-09-22 ENCOUNTER — Encounter (INDEPENDENT_AMBULATORY_CARE_PROVIDER_SITE_OTHER): Payer: Self-pay | Admitting: Nephrology

## 2007-09-30 ENCOUNTER — Telehealth: Payer: Self-pay | Admitting: Internal Medicine

## 2007-10-03 ENCOUNTER — Emergency Department (HOSPITAL_COMMUNITY): Admission: EM | Admit: 2007-10-03 | Discharge: 2007-10-03 | Payer: Self-pay | Admitting: Emergency Medicine

## 2007-10-03 ENCOUNTER — Telehealth: Payer: Self-pay | Admitting: Internal Medicine

## 2007-10-05 ENCOUNTER — Emergency Department (HOSPITAL_COMMUNITY): Admission: EM | Admit: 2007-10-05 | Discharge: 2007-10-05 | Payer: Self-pay | Admitting: Emergency Medicine

## 2007-10-07 ENCOUNTER — Emergency Department (HOSPITAL_COMMUNITY): Admission: EM | Admit: 2007-10-07 | Discharge: 2007-10-08 | Payer: Self-pay | Admitting: Emergency Medicine

## 2007-10-07 ENCOUNTER — Emergency Department (HOSPITAL_COMMUNITY): Admission: EM | Admit: 2007-10-07 | Discharge: 2007-10-07 | Payer: Self-pay | Admitting: Emergency Medicine

## 2007-10-08 ENCOUNTER — Emergency Department (HOSPITAL_COMMUNITY): Admission: EM | Admit: 2007-10-08 | Discharge: 2007-10-08 | Payer: Self-pay | Admitting: Emergency Medicine

## 2007-10-08 ENCOUNTER — Telehealth: Payer: Self-pay | Admitting: Internal Medicine

## 2007-10-09 ENCOUNTER — Emergency Department (HOSPITAL_COMMUNITY): Admission: EM | Admit: 2007-10-09 | Discharge: 2007-10-09 | Payer: Self-pay | Admitting: Emergency Medicine

## 2007-10-09 ENCOUNTER — Telehealth: Payer: Self-pay | Admitting: Internal Medicine

## 2007-10-12 ENCOUNTER — Emergency Department (HOSPITAL_COMMUNITY): Admission: EM | Admit: 2007-10-12 | Discharge: 2007-10-13 | Payer: Self-pay | Admitting: Emergency Medicine

## 2007-10-13 ENCOUNTER — Ambulatory Visit: Payer: Self-pay | Admitting: Licensed Clinical Social Worker

## 2007-10-14 ENCOUNTER — Inpatient Hospital Stay (HOSPITAL_COMMUNITY): Admission: EM | Admit: 2007-10-14 | Discharge: 2007-10-15 | Payer: Self-pay | Admitting: Emergency Medicine

## 2007-10-14 ENCOUNTER — Emergency Department (HOSPITAL_COMMUNITY): Admission: EM | Admit: 2007-10-14 | Discharge: 2007-10-14 | Payer: Self-pay | Admitting: Emergency Medicine

## 2007-10-16 ENCOUNTER — Telehealth: Payer: Self-pay | Admitting: Internal Medicine

## 2007-10-16 ENCOUNTER — Emergency Department (HOSPITAL_COMMUNITY): Admission: EM | Admit: 2007-10-16 | Discharge: 2007-10-16 | Payer: Self-pay | Admitting: Emergency Medicine

## 2007-10-17 ENCOUNTER — Emergency Department (HOSPITAL_COMMUNITY): Admission: EM | Admit: 2007-10-17 | Discharge: 2007-10-17 | Payer: Self-pay | Admitting: Emergency Medicine

## 2007-10-20 ENCOUNTER — Telehealth: Payer: Self-pay | Admitting: Internal Medicine

## 2007-10-22 ENCOUNTER — Telehealth: Payer: Self-pay | Admitting: Internal Medicine

## 2007-10-27 ENCOUNTER — Ambulatory Visit: Payer: Self-pay | Admitting: Internal Medicine

## 2007-10-27 ENCOUNTER — Ambulatory Visit: Payer: Self-pay | Admitting: Licensed Clinical Social Worker

## 2007-10-27 DIAGNOSIS — J309 Allergic rhinitis, unspecified: Secondary | ICD-10-CM | POA: Insufficient documentation

## 2007-11-02 ENCOUNTER — Emergency Department (HOSPITAL_COMMUNITY): Admission: EM | Admit: 2007-11-02 | Discharge: 2007-11-03 | Payer: Self-pay | Admitting: Emergency Medicine

## 2007-11-03 ENCOUNTER — Telehealth: Payer: Self-pay | Admitting: Internal Medicine

## 2007-11-05 ENCOUNTER — Telehealth: Payer: Self-pay | Admitting: Internal Medicine

## 2007-11-06 ENCOUNTER — Emergency Department (HOSPITAL_COMMUNITY): Admission: EM | Admit: 2007-11-06 | Discharge: 2007-11-06 | Payer: Self-pay | Admitting: Emergency Medicine

## 2007-11-10 ENCOUNTER — Ambulatory Visit: Payer: Self-pay | Admitting: Licensed Clinical Social Worker

## 2007-11-13 ENCOUNTER — Encounter: Payer: Self-pay | Admitting: Internal Medicine

## 2007-11-19 ENCOUNTER — Telehealth: Payer: Self-pay | Admitting: Internal Medicine

## 2007-11-20 ENCOUNTER — Telehealth (INDEPENDENT_AMBULATORY_CARE_PROVIDER_SITE_OTHER): Payer: Self-pay | Admitting: *Deleted

## 2007-11-20 ENCOUNTER — Telehealth: Payer: Self-pay | Admitting: Internal Medicine

## 2007-11-21 ENCOUNTER — Telehealth: Payer: Self-pay | Admitting: Internal Medicine

## 2007-11-24 ENCOUNTER — Ambulatory Visit: Payer: Self-pay | Admitting: Licensed Clinical Social Worker

## 2007-11-25 ENCOUNTER — Telehealth: Payer: Self-pay | Admitting: Internal Medicine

## 2007-11-25 ENCOUNTER — Emergency Department (HOSPITAL_COMMUNITY): Admission: EM | Admit: 2007-11-25 | Discharge: 2007-11-25 | Payer: Self-pay | Admitting: Emergency Medicine

## 2007-11-28 ENCOUNTER — Telehealth: Payer: Self-pay | Admitting: Internal Medicine

## 2007-12-03 ENCOUNTER — Telehealth: Payer: Self-pay | Admitting: Internal Medicine

## 2007-12-11 ENCOUNTER — Inpatient Hospital Stay (HOSPITAL_COMMUNITY): Admission: EM | Admit: 2007-12-11 | Discharge: 2007-12-12 | Payer: Self-pay | Admitting: Emergency Medicine

## 2007-12-12 ENCOUNTER — Telehealth: Payer: Self-pay | Admitting: Internal Medicine

## 2007-12-15 ENCOUNTER — Ambulatory Visit: Payer: Self-pay | Admitting: Licensed Clinical Social Worker

## 2007-12-15 ENCOUNTER — Ambulatory Visit: Payer: Self-pay | Admitting: Internal Medicine

## 2007-12-15 DIAGNOSIS — D239 Other benign neoplasm of skin, unspecified: Secondary | ICD-10-CM | POA: Insufficient documentation

## 2007-12-25 ENCOUNTER — Emergency Department (HOSPITAL_COMMUNITY): Admission: EM | Admit: 2007-12-25 | Discharge: 2007-12-25 | Payer: Self-pay | Admitting: Emergency Medicine

## 2007-12-26 ENCOUNTER — Emergency Department (HOSPITAL_COMMUNITY): Admission: EM | Admit: 2007-12-26 | Discharge: 2007-12-27 | Payer: Self-pay | Admitting: Emergency Medicine

## 2007-12-27 ENCOUNTER — Inpatient Hospital Stay (HOSPITAL_COMMUNITY): Admission: EM | Admit: 2007-12-27 | Discharge: 2007-12-28 | Payer: Self-pay | Admitting: Emergency Medicine

## 2008-01-02 ENCOUNTER — Inpatient Hospital Stay (HOSPITAL_COMMUNITY): Admission: EM | Admit: 2008-01-02 | Discharge: 2008-01-06 | Payer: Self-pay | Admitting: Emergency Medicine

## 2008-01-02 ENCOUNTER — Ambulatory Visit: Payer: Self-pay | Admitting: Internal Medicine

## 2008-01-07 ENCOUNTER — Ambulatory Visit: Payer: Self-pay | Admitting: Licensed Clinical Social Worker

## 2008-01-08 ENCOUNTER — Emergency Department (HOSPITAL_COMMUNITY): Admission: EM | Admit: 2008-01-08 | Discharge: 2008-01-08 | Payer: Self-pay | Admitting: Emergency Medicine

## 2008-01-08 ENCOUNTER — Ambulatory Visit: Payer: Self-pay | Admitting: Internal Medicine

## 2008-01-08 DIAGNOSIS — M159 Polyosteoarthritis, unspecified: Secondary | ICD-10-CM | POA: Insufficient documentation

## 2008-01-11 ENCOUNTER — Emergency Department (HOSPITAL_COMMUNITY): Admission: EM | Admit: 2008-01-11 | Discharge: 2008-01-11 | Payer: Self-pay | Admitting: Emergency Medicine

## 2008-01-13 ENCOUNTER — Emergency Department (HOSPITAL_COMMUNITY): Admission: EM | Admit: 2008-01-13 | Discharge: 2008-01-13 | Payer: Self-pay | Admitting: Emergency Medicine

## 2008-01-14 ENCOUNTER — Emergency Department (HOSPITAL_COMMUNITY): Admission: EM | Admit: 2008-01-14 | Discharge: 2008-01-14 | Payer: Self-pay | Admitting: Emergency Medicine

## 2008-01-15 ENCOUNTER — Emergency Department (HOSPITAL_COMMUNITY): Admission: EM | Admit: 2008-01-15 | Discharge: 2008-01-15 | Payer: Self-pay | Admitting: Unknown Physician Specialty

## 2008-01-23 ENCOUNTER — Ambulatory Visit: Payer: Self-pay | Admitting: Licensed Clinical Social Worker

## 2008-01-31 IMAGING — XA IR CENTRAL VENOUS CATHETER
1 series · 13 of 24 positions shown · non-contrast
Comparison: none

CLINICAL DATA: Catheter for TPA infusion.
 LEFT IJ VEIN DIALYSIS CATHETER TPA INFUSION: 
 LEFT IJ VEIN TUNNELED DIALYSIS CATHETER CONTRAST INJECTION: 
 Procedure:  Utilizing sterile technique, contrast was injected through each port of the left IJ vein tunneled dialysis catheter for digital subtraction angiography.  No complications.

[Series 1: run · 13 of 25 slices shown]
[im 1/25]
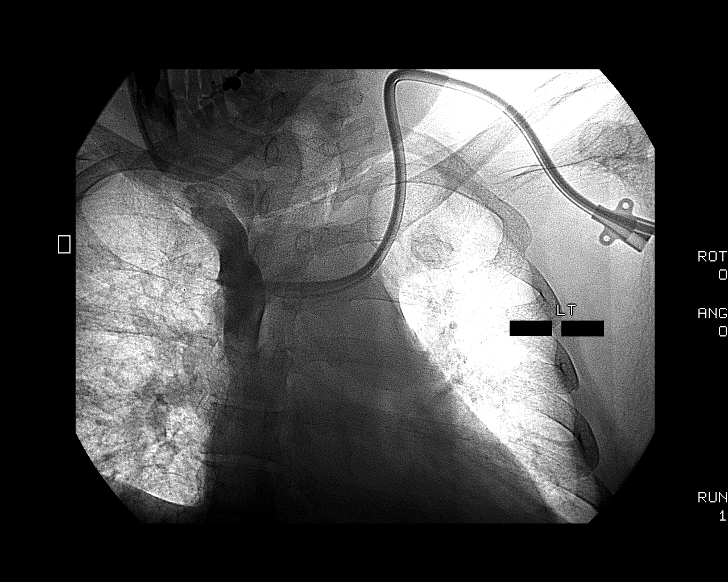
[im 3/25]
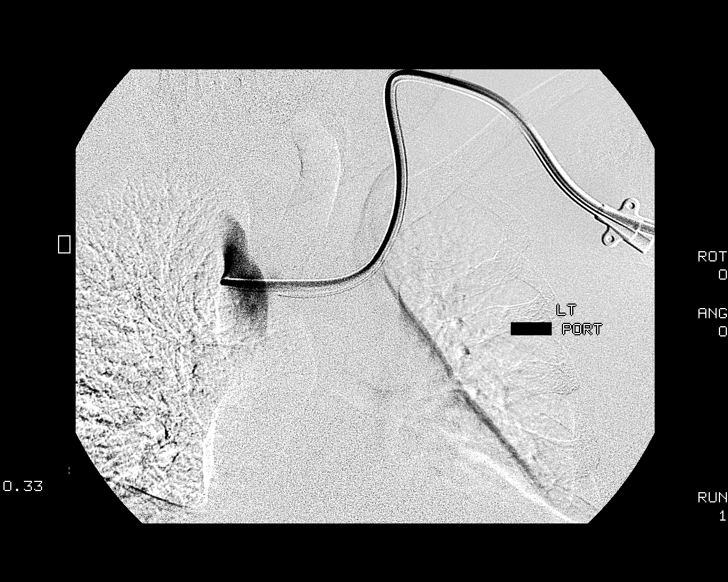
[im 5/25]
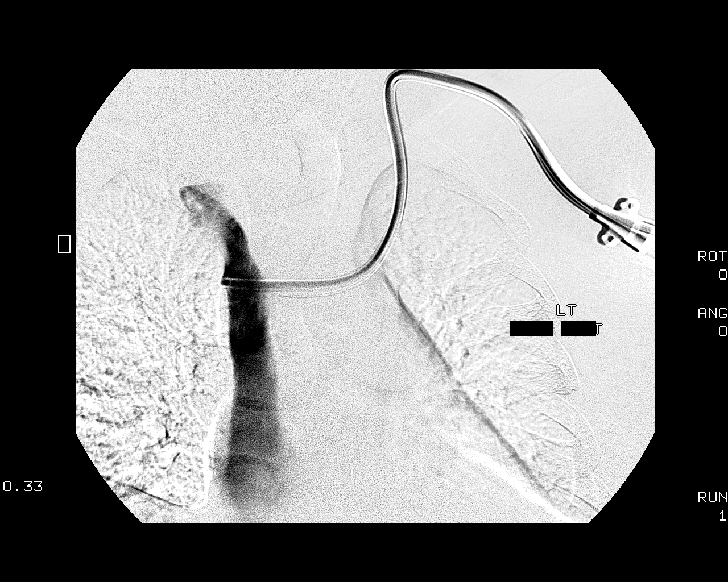
[im 7/25]
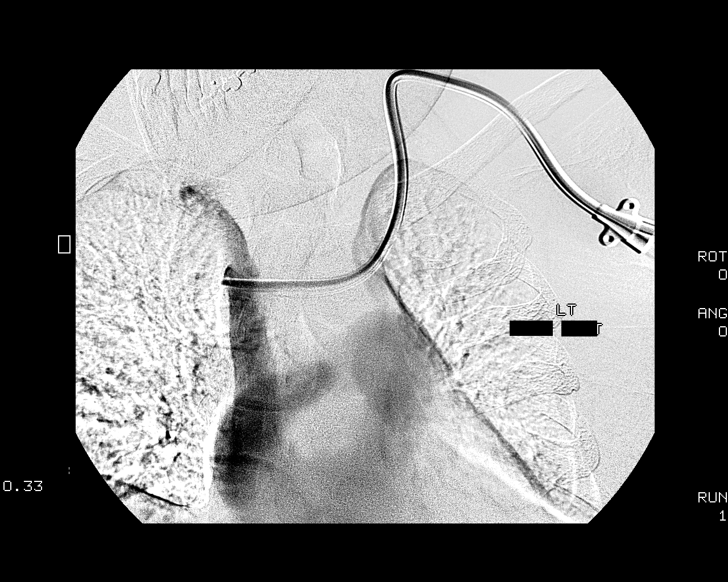
[im 9/25]
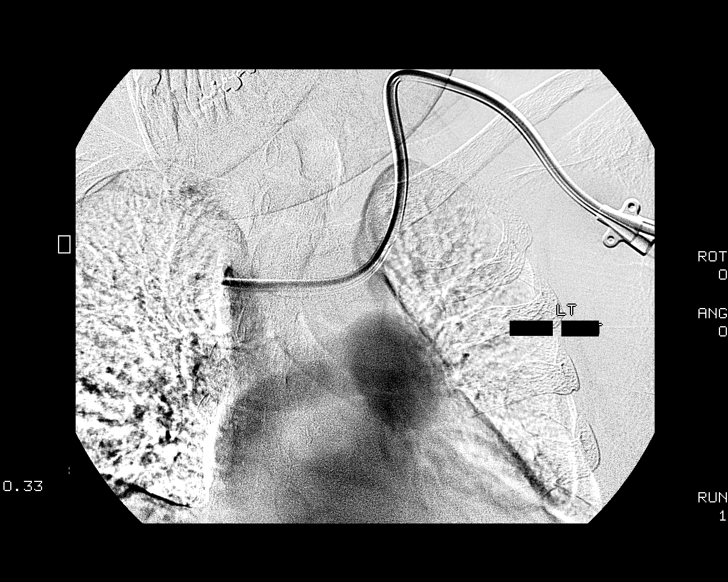
[im 11/25]
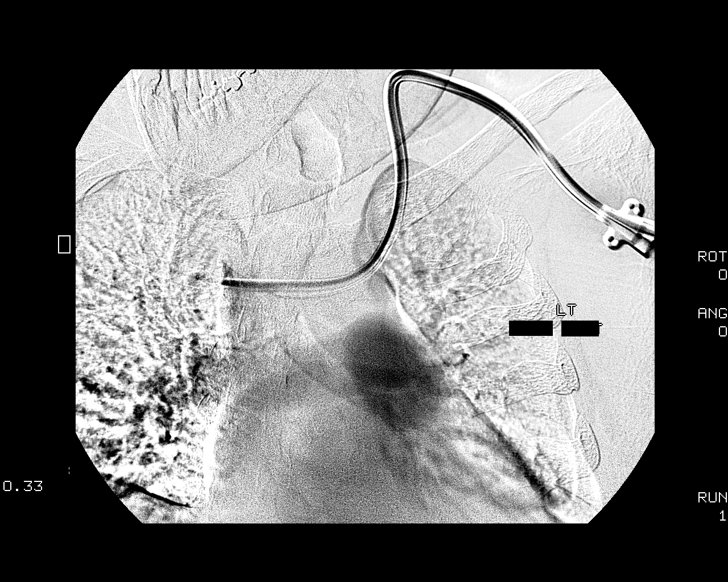
[im 13/25]
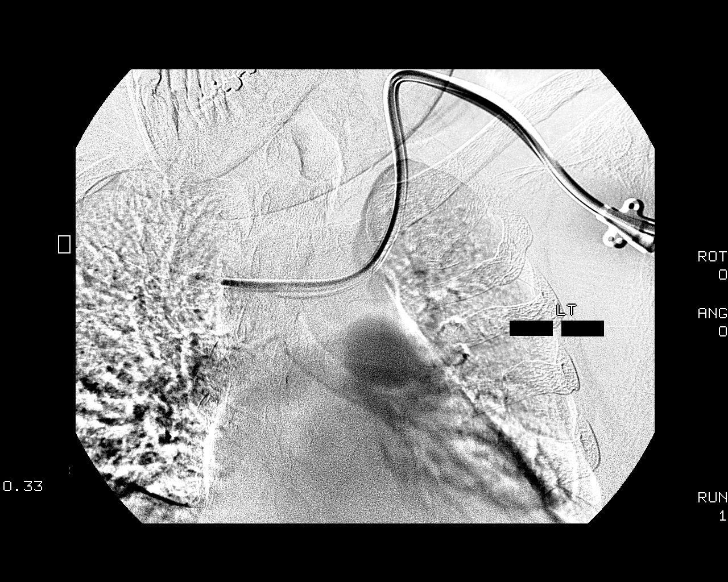
[im 14/25]
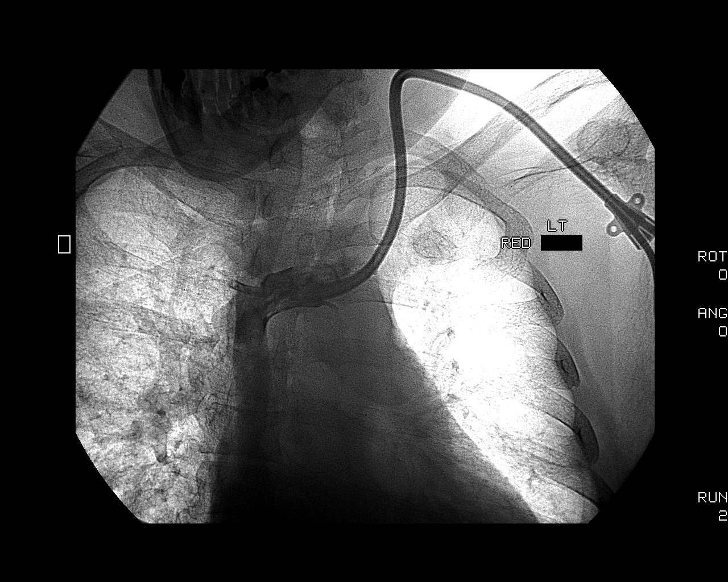
[im 16/25]
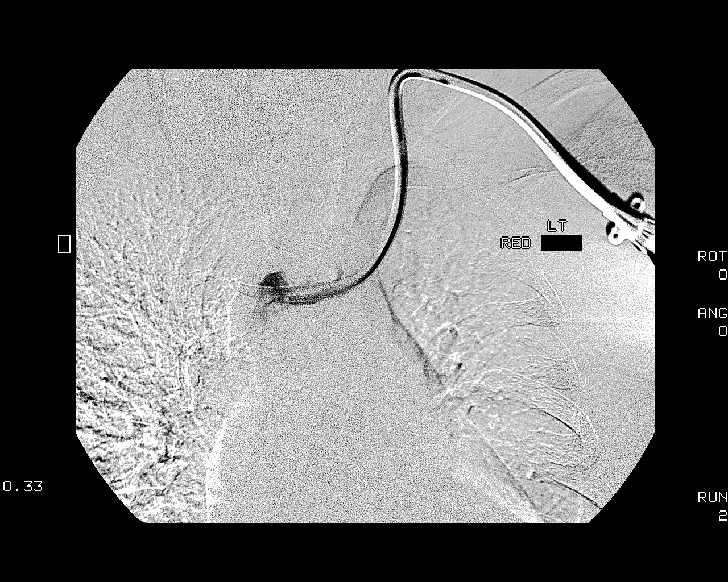
[im 18/25]
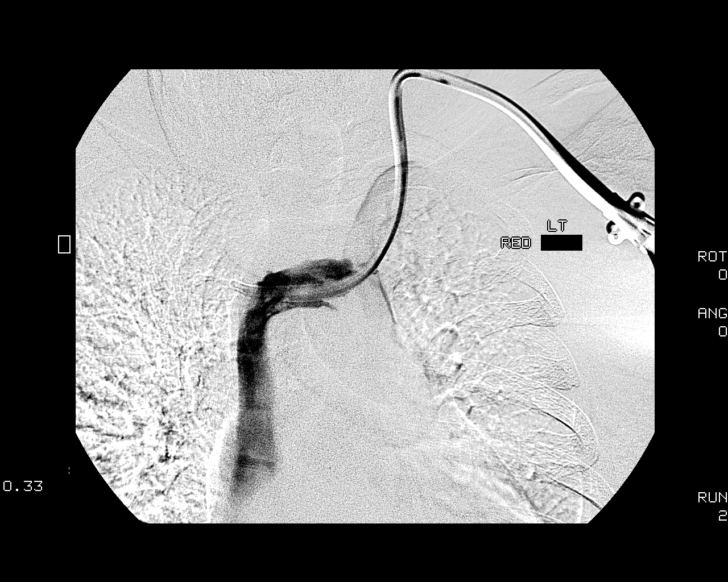
[im 20/25]
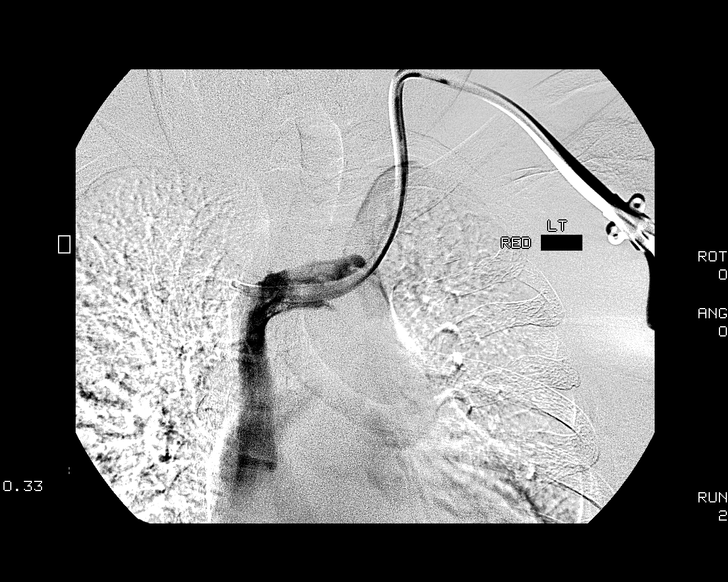
[im 22/25]
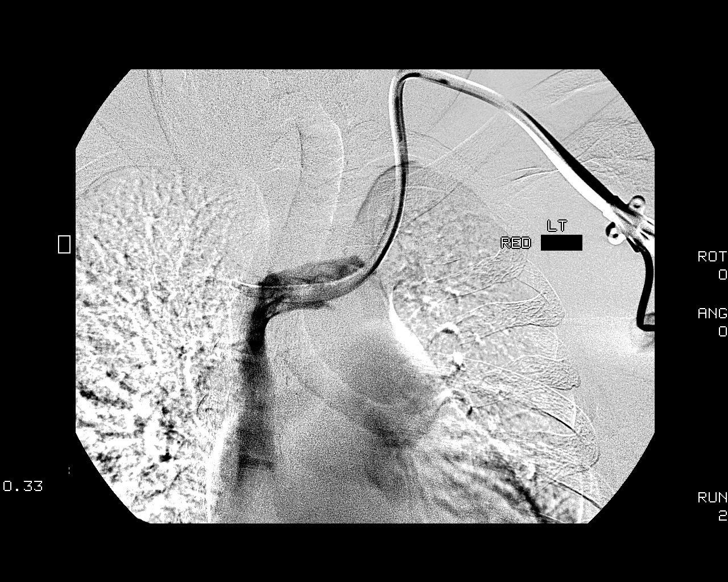
[im 25/25]
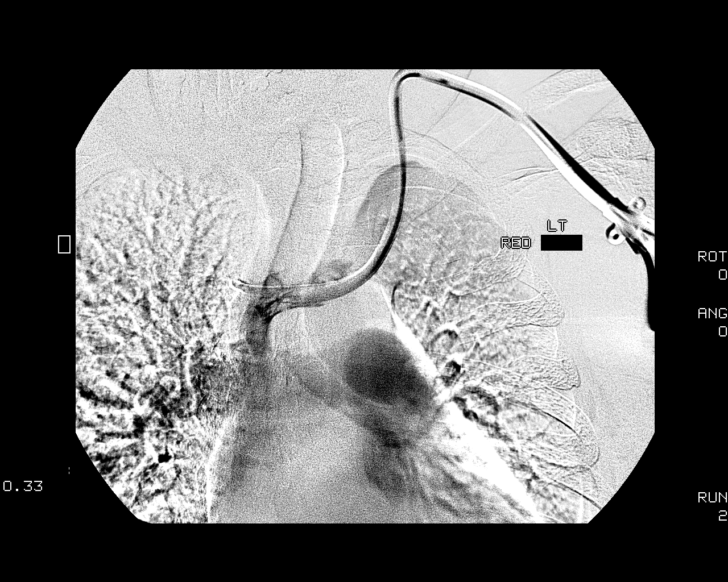

[13 of 24 positions shown; findings below may reference images not displayed]

FINDINGS: The longer port tip is in the upper SVC at the confluence of the right and left innominate veins.  The tip is against the right sidewall of the vein.  The shorted catheter tip is in the left innominate vein.  Fibrin sheath is noted around the shorter tip.  There probably is some fibrin impeding flow for the longer tip, as well, given its position against the sidewall.
IMPRESSION: Dialysis catheter positioned as described with fibrin sheath.  The patient was transferred to the outpatient area in stable condition for TPA infusion.

## 2008-02-02 ENCOUNTER — Ambulatory Visit: Payer: Self-pay | Admitting: Licensed Clinical Social Worker

## 2008-02-02 IMAGING — CR DG CHEST 1V
1 series · 1 of 1 positions shown · non-contrast
Comparison: none

CLINICAL DATA: ESRD, pre-op.  History of asthma, shortness of breath.   Left nephrectomy.  [facility].
 CHEST ? 1 VIEW:

[view not recorded]
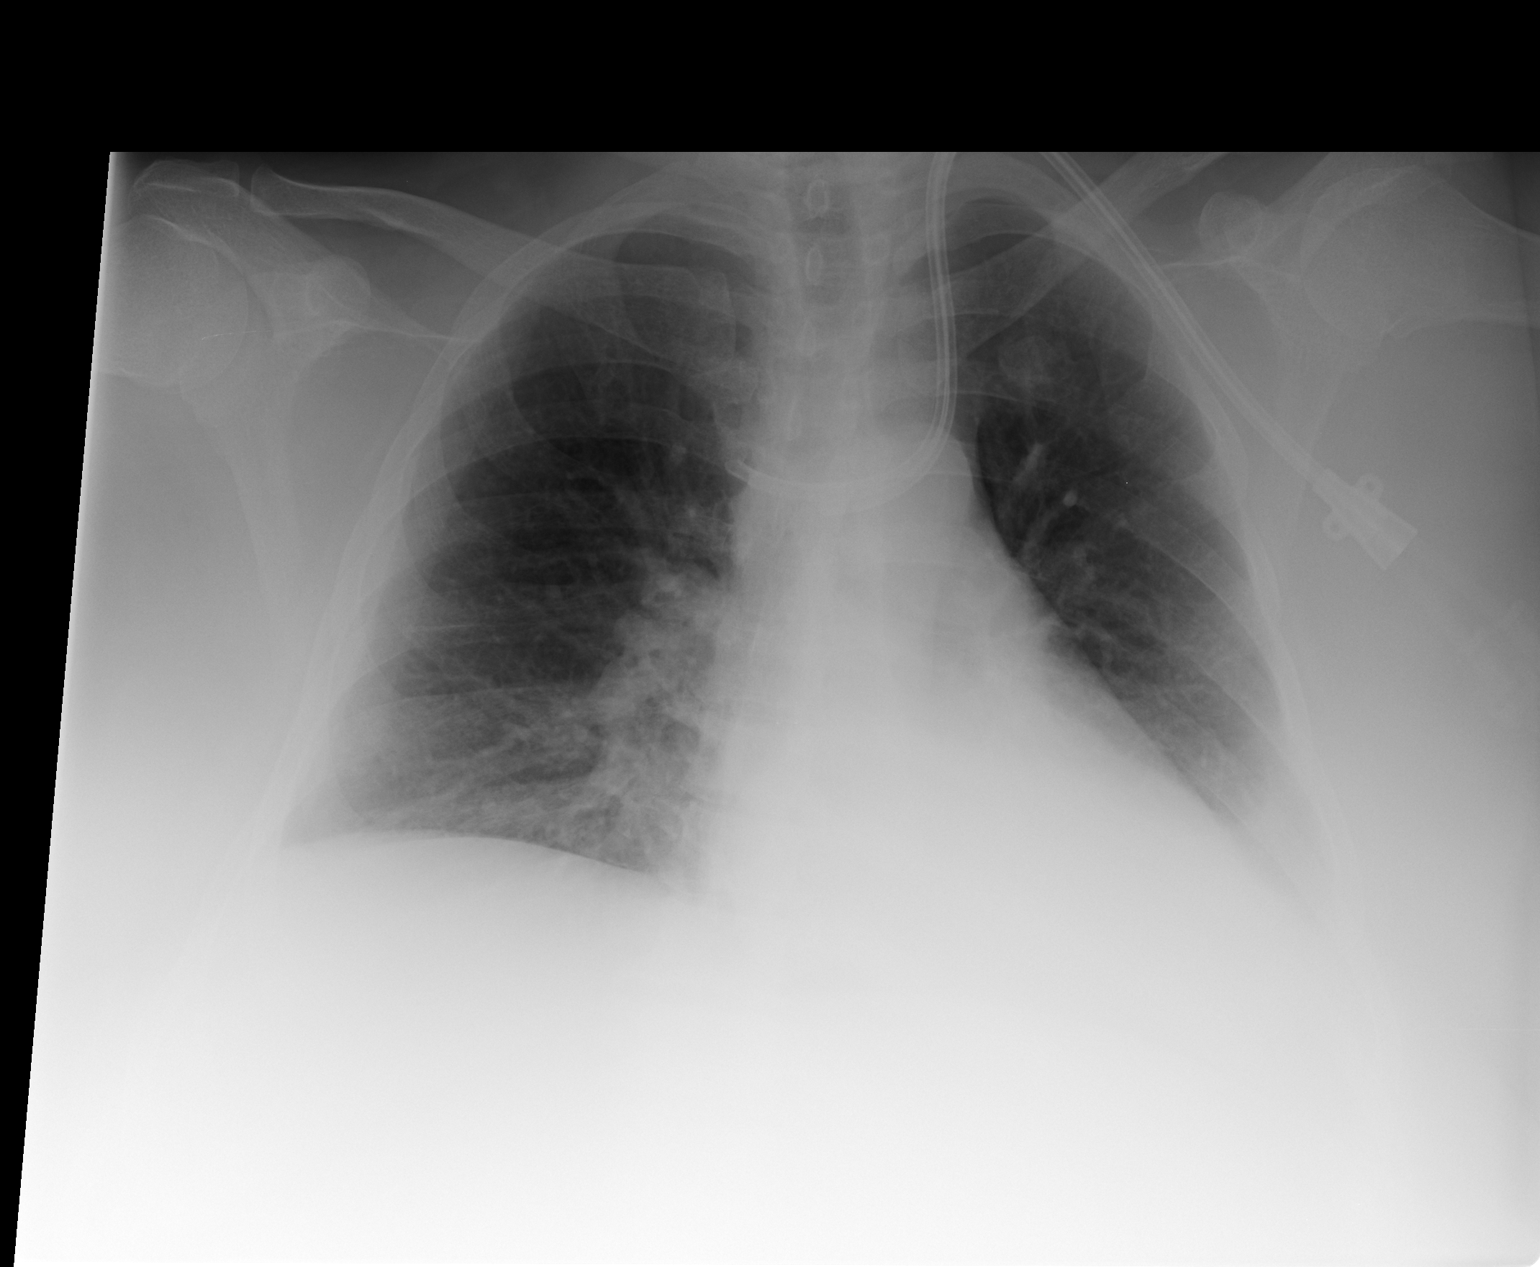

[1 of 1 positions shown; findings below may reference images not displayed]

FINDINGS: Central venous catheter enters via left jugular vein approach.  The tip is in the proximal aspect of the SVC.  Enlarged cardiopericardial silhouette and pulmonary vascular congestion.  No frank pulmonary edema.  Left lower lobar atelectasis/consolidation.
IMPRESSION: Cardiomegaly and mild to moderate pulmonary vascular congestion. Left lower lobar atelectasis/consolidation.

## 2008-02-10 ENCOUNTER — Emergency Department (HOSPITAL_COMMUNITY): Admission: EM | Admit: 2008-02-10 | Discharge: 2008-02-10 | Payer: Self-pay | Admitting: Emergency Medicine

## 2008-02-15 ENCOUNTER — Inpatient Hospital Stay (HOSPITAL_COMMUNITY): Admission: EM | Admit: 2008-02-15 | Discharge: 2008-02-18 | Payer: Self-pay | Admitting: Emergency Medicine

## 2008-02-23 ENCOUNTER — Ambulatory Visit: Payer: Self-pay | Admitting: Licensed Clinical Social Worker

## 2008-02-25 ENCOUNTER — Ambulatory Visit: Payer: Self-pay | Admitting: Internal Medicine

## 2008-02-25 ENCOUNTER — Emergency Department (HOSPITAL_COMMUNITY): Admission: EM | Admit: 2008-02-25 | Discharge: 2008-02-25 | Payer: Self-pay | Admitting: Emergency Medicine

## 2008-02-25 ENCOUNTER — Telehealth: Payer: Self-pay | Admitting: Internal Medicine

## 2008-02-25 DIAGNOSIS — L909 Atrophic disorder of skin, unspecified: Secondary | ICD-10-CM | POA: Insufficient documentation

## 2008-02-25 DIAGNOSIS — L919 Hypertrophic disorder of the skin, unspecified: Secondary | ICD-10-CM

## 2008-03-01 ENCOUNTER — Ambulatory Visit: Payer: Self-pay | Admitting: Vascular Surgery

## 2008-03-01 ENCOUNTER — Ambulatory Visit (HOSPITAL_COMMUNITY): Admission: RE | Admit: 2008-03-01 | Discharge: 2008-03-01 | Payer: Self-pay | Admitting: Vascular Surgery

## 2008-03-02 IMAGING — CT CT HEAD W/O CM
1 of 2 series · 13 of 30 positions shown, 17 images · IV contrast (agent unspecified)
Comparison: None.

CLINICAL DATA: 49-year-old with headache.
 HEAD CT WITHOUT CONTRAST:
TECHNIQUE: Contiguous axial images were obtained from the base of the skull through the vertex according to standard protocol without contrast.

[Series 2: brain · axial · 0.47mm/px · z∈[+119,+243]mm · 13 of 28 slices shown, 17 images]
[im 2/28  brain]
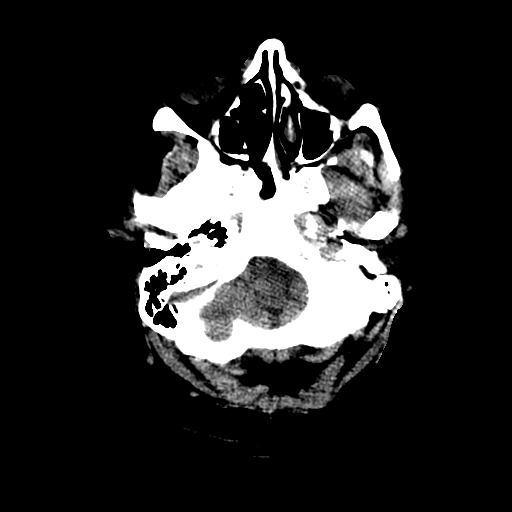
[im 2/28  bone]
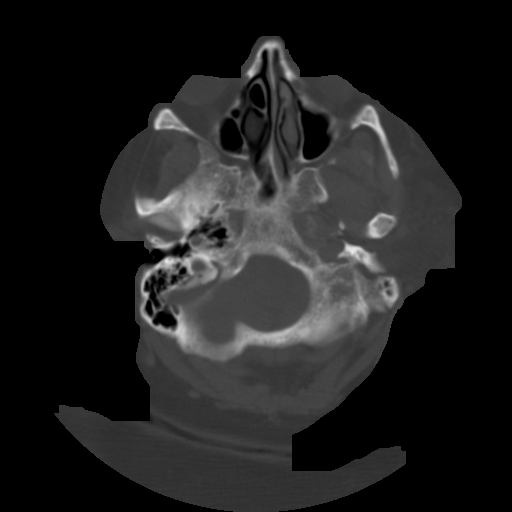
[im 4/28  brain]
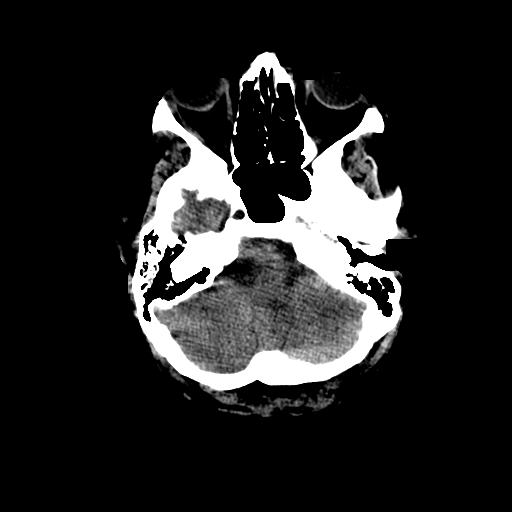
[im 6/28  brain]
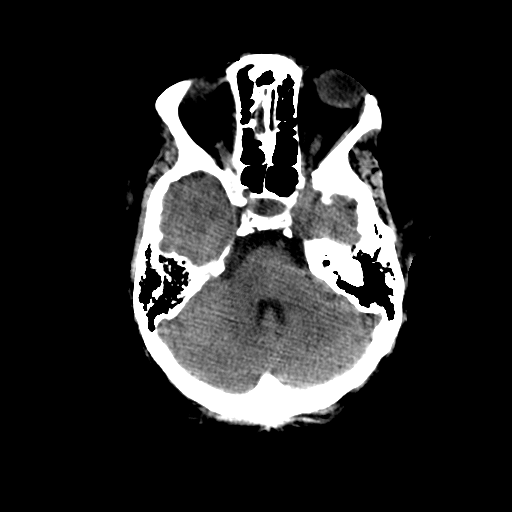
[im 8/28  brain]
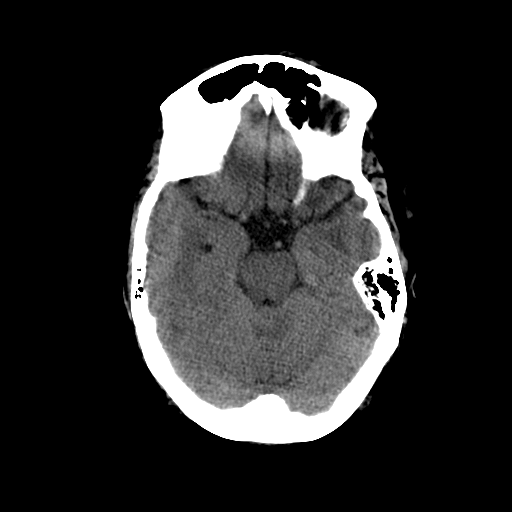
[im 10/28  brain]
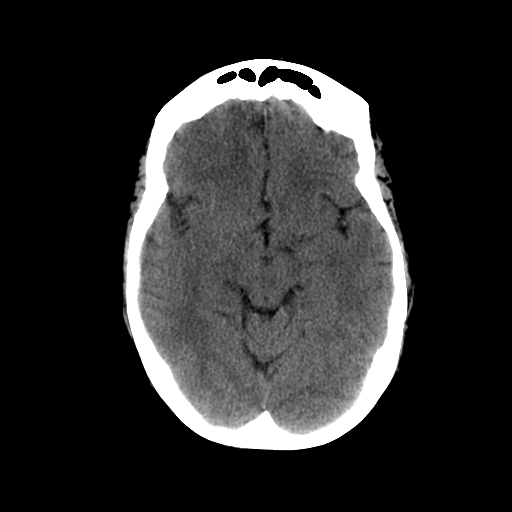
[im 10/28  bone]
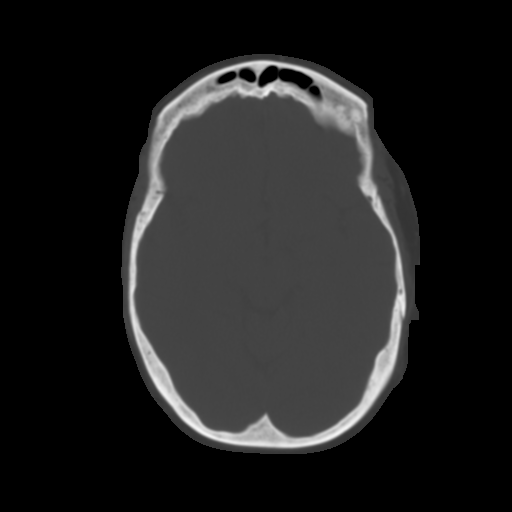
[im 12/28  brain]
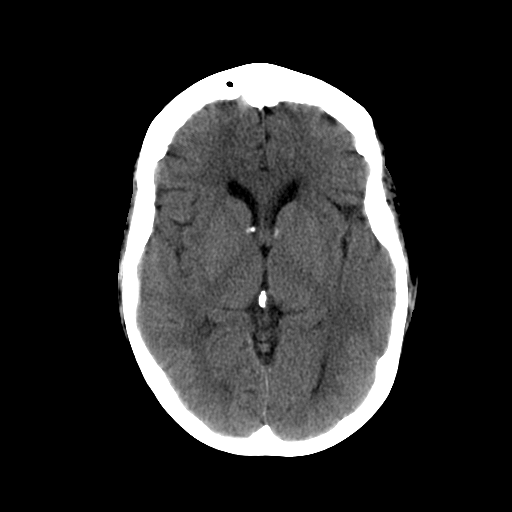
[im 14/28  brain]
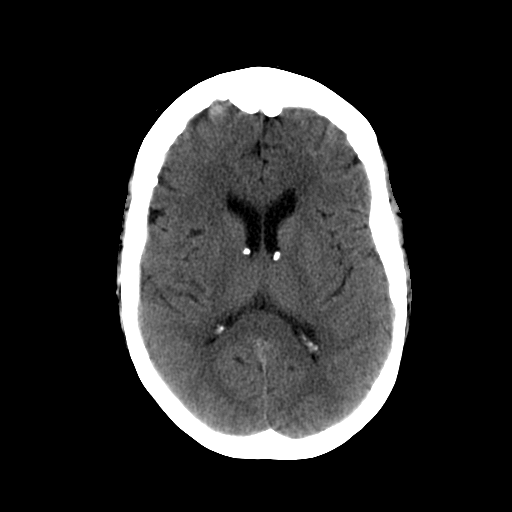
[im 16/28  brain]
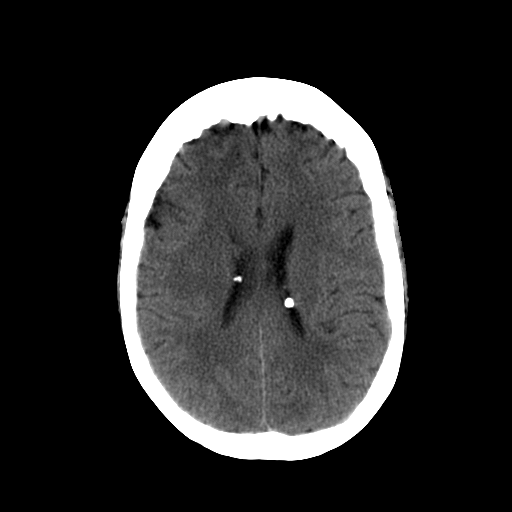
[im 18/28  brain]
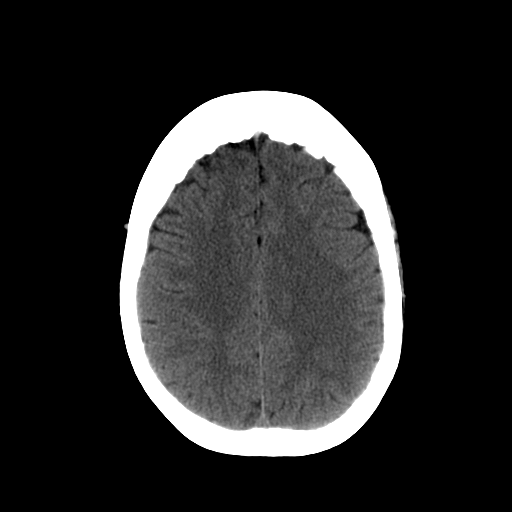
[im 18/28  bone]
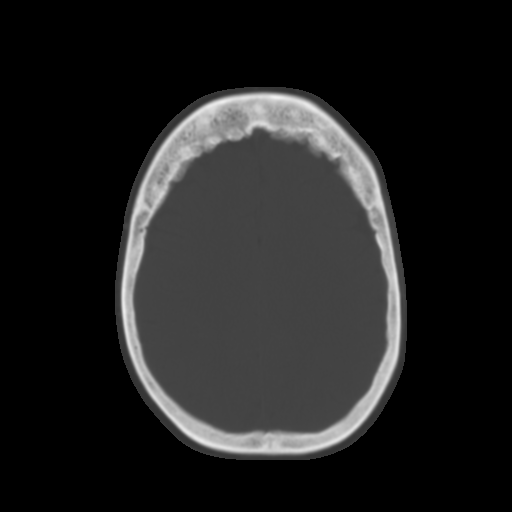
[im 20/28  brain]
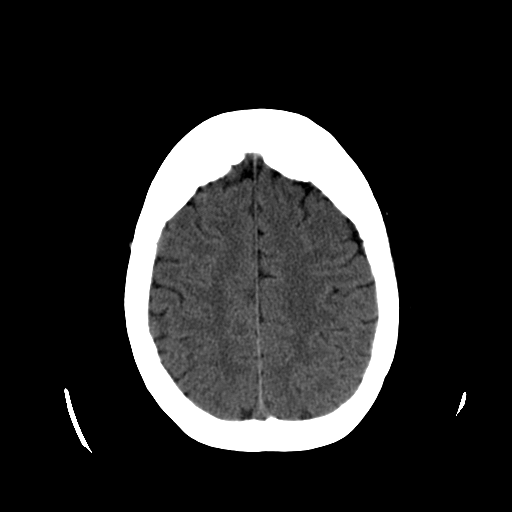
[im 22/28  brain]
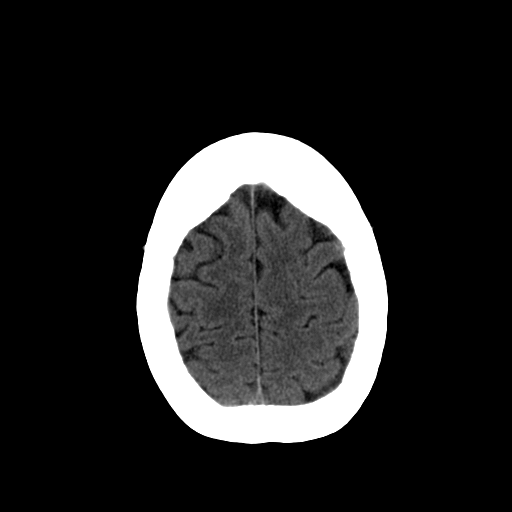
[im 24/28  brain]
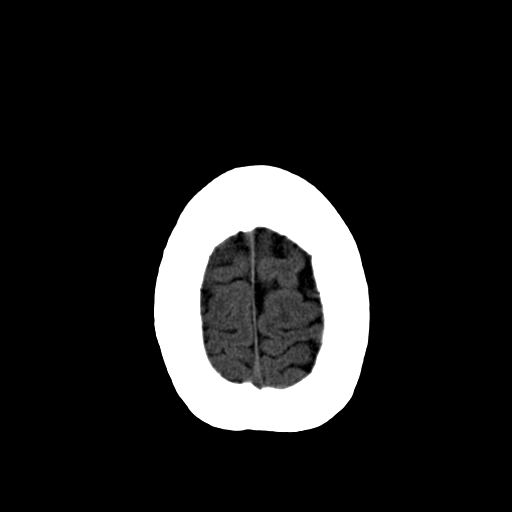
[im 26/28  brain]
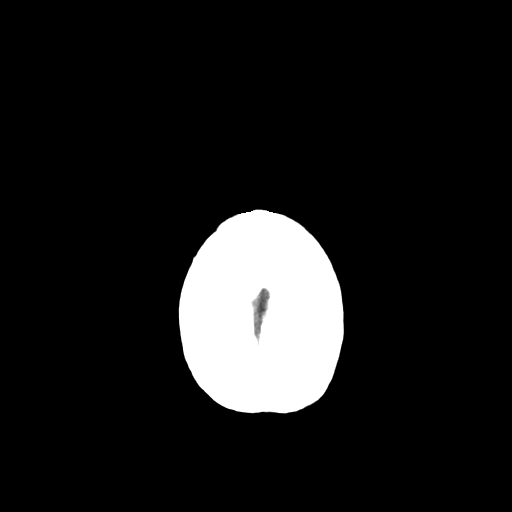
[im 26/28  bone]
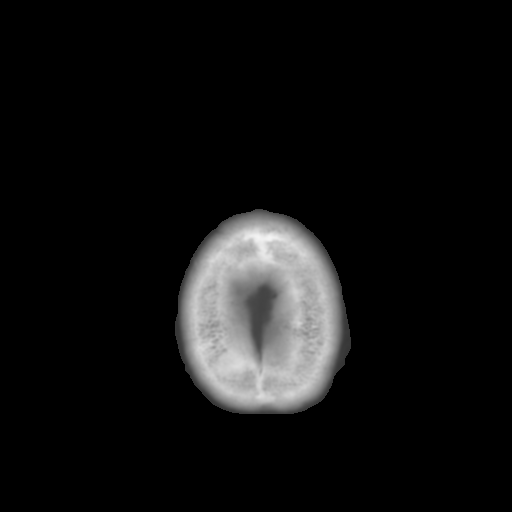

[13 of 30 positions shown; findings below may reference images not displayed]

FINDINGS: Intracranially, the ventricles are in the midline without mass effect or shift.  No extraaxial fluid collections are seen.  No CT evidence for acute intracranial abnormality.  No intracranial mass lesions.  Brainstem and cerebellum appear normal.  There are some scattered bilateral parenchymal calcifications near the ventricles and may be due to remote TORCH infection.  The bony calvarium is intact.  Hyperostosis frontalis interna is noted.  The visualized paranasal sinuses and mastoid air cells are clear.
IMPRESSION: 1. No CT evidence for acute intracranial abnormality.  No intracranial mass lesions.
 2. Scattered calcifications in the periventricular areas may be due to a remote TORCH infection.  
 3. Hyperostosis frontalis interna.

## 2008-03-08 ENCOUNTER — Emergency Department (HOSPITAL_COMMUNITY): Admission: EM | Admit: 2008-03-08 | Discharge: 2008-03-08 | Payer: Self-pay | Admitting: Emergency Medicine

## 2008-03-08 ENCOUNTER — Ambulatory Visit: Payer: Self-pay | Admitting: Licensed Clinical Social Worker

## 2008-03-09 ENCOUNTER — Emergency Department (HOSPITAL_COMMUNITY): Admission: EM | Admit: 2008-03-09 | Discharge: 2008-03-10 | Payer: Self-pay | Admitting: Emergency Medicine

## 2008-03-10 ENCOUNTER — Telehealth: Payer: Self-pay | Admitting: *Deleted

## 2008-03-11 ENCOUNTER — Ambulatory Visit: Payer: Self-pay | Admitting: Internal Medicine

## 2008-03-11 ENCOUNTER — Inpatient Hospital Stay (HOSPITAL_COMMUNITY): Admission: EM | Admit: 2008-03-11 | Discharge: 2008-03-25 | Payer: Self-pay | Admitting: Emergency Medicine

## 2008-03-23 ENCOUNTER — Encounter (INDEPENDENT_AMBULATORY_CARE_PROVIDER_SITE_OTHER): Payer: Self-pay | Admitting: Gastroenterology

## 2008-04-07 ENCOUNTER — Emergency Department (HOSPITAL_BASED_OUTPATIENT_CLINIC_OR_DEPARTMENT_OTHER): Admission: EM | Admit: 2008-04-07 | Discharge: 2008-04-07 | Payer: Self-pay | Admitting: Emergency Medicine

## 2008-04-10 ENCOUNTER — Emergency Department (HOSPITAL_COMMUNITY): Admission: EM | Admit: 2008-04-10 | Discharge: 2008-04-10 | Payer: Self-pay | Admitting: Emergency Medicine

## 2008-04-12 IMAGING — XA IR VENTRICULOPERITONEALSHUNT EVAL/INJ
1 series · 12 of 24 positions shown · non-contrast
Comparison: none

CLINICAL DATA: Poorly maturing left upper extremity fistula.  Poorly functioning left dialysis catheter.   
LEFT UPPER EXTREMITY FISTULOGRAM:
ULTRASOUND GUIDANCE FOR VASCULAR ACCESS: 
VENOUS PTA: 
Sedation:  Fentanyl 50 mcg, Versed 1 mg.
Procedure:  The draining vein was identified with ultrasound, and an 18 gauge Angiocath was directed into this vein with ultrasound guidance.  Following the injection of contrast, there was a small amount of extravasation.  As a result, the vein was punctured more distally, and fistulogram images were obtained.  There is an area of narrowing associated with the small amount of extravasation in the cephalic vein.  However, this area appears to be somewhat irregular, and this is felt to be related to underlying stenosis rather than just vasospasm.  In addition, there are multiple collateral vessels filling the brachial venous system during injection of the cephalic vein.  The central vascular structures appear patent, although there is poor visualization of the left innominate and SVC related to a left-sided dialysis catheter.   The dialysis catheter terminates at the junction of the left innominate vein and the SVC, and this probably explains why the patient has been having difficulty with dialysis.  The arterial anastomosis appears patent.  The 18 gauge angiocatheter was exchanged for a 6 French short sheath over a Bentson wire.  Inflation of a 4 mm x 40 mm Powerflex balloon demonstrated a tight waist associated with the area of stenosis.  The patient complained of  pain while inflating this area, and, therefore, the patient was given conscious sedation.  The vein was further dilated with a 6 mm x 40 mm Conquest balloon which was able to fully inflate at approximately 25 atmospheres of pressure.  Follow up venogram demonstrated extravasation of contrast at the area of stenosis.   As a result, manual compression was applied to the site, and a 4 mm balloon was also inflated at the site of contrast extravasation.  Follow up angiogram demonstrated very little extravasation at this site.  In addition, there was no significant filling of the distal collateral vessels.  The patient did receive 2111 units of heparin for this procedure.  The sheath was removed with manual compression, and a small amount of compression was applied at the site of known extravasation.  
Complications:  A small amount of contrast extravasation at the area of stenosis during the initial venous puncture and following angioplasty.

[Series 1: run · 12 of 65 slices shown]
[im 3/65]
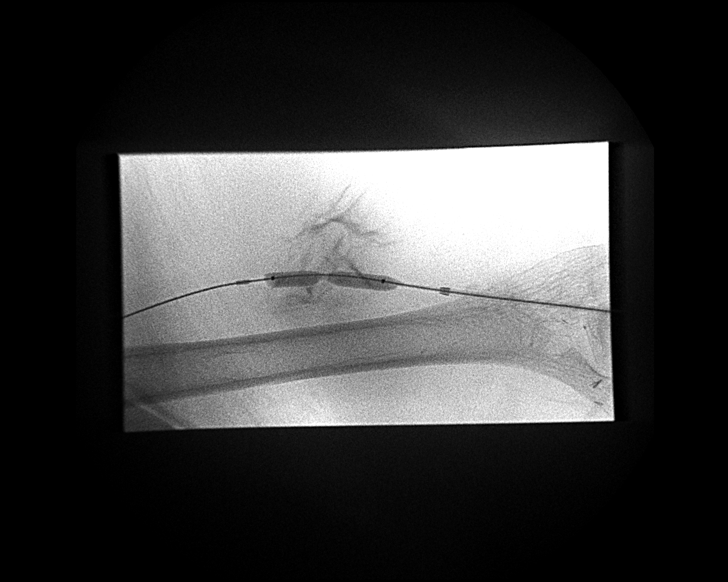
[im 9/65]
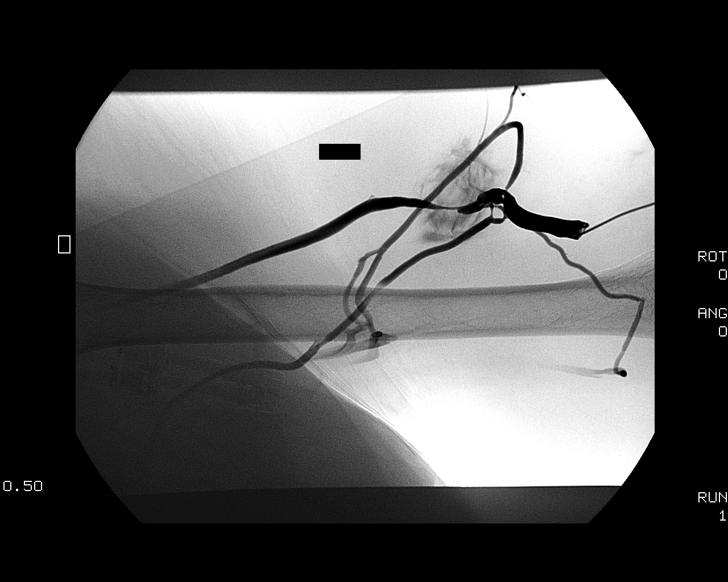
[im 14/65]
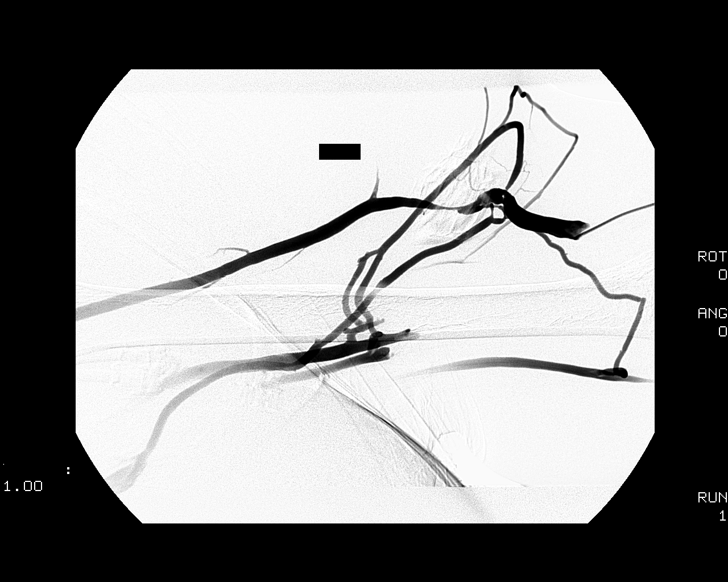
[im 20/65]
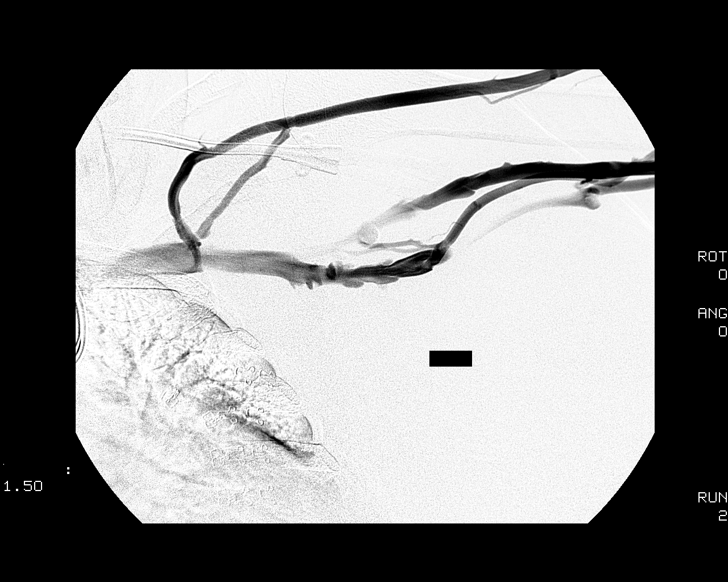
[im 26/65]
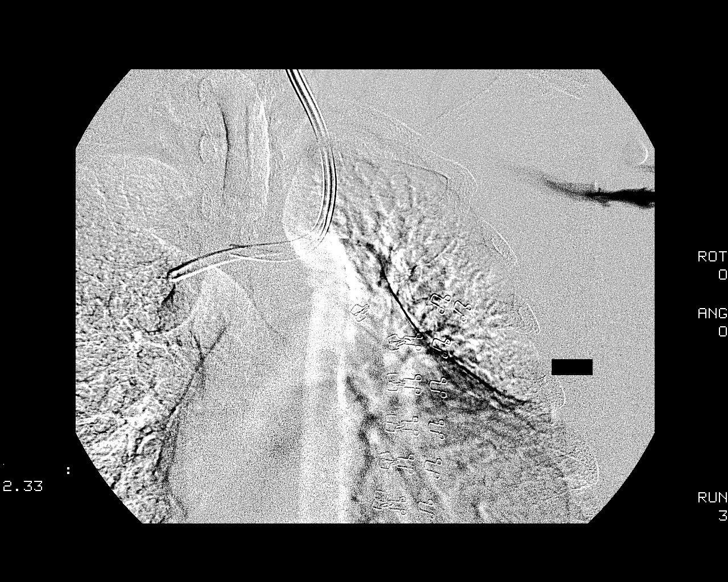
[im 31/65]
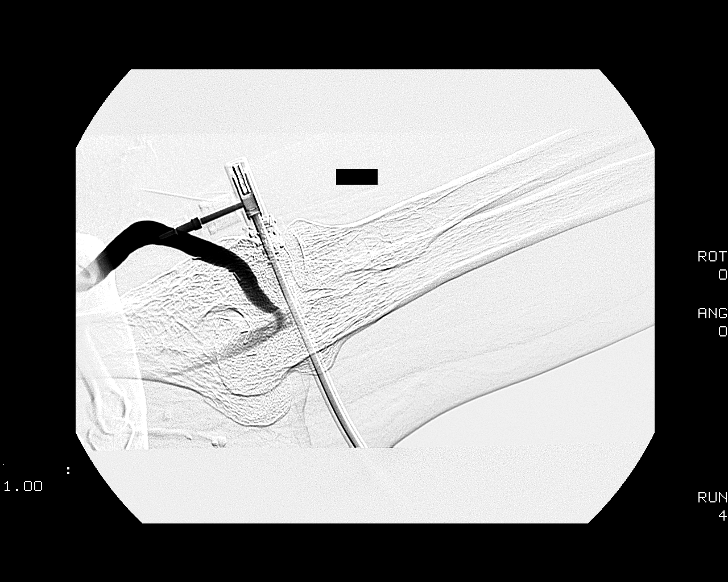
[im 37/65]
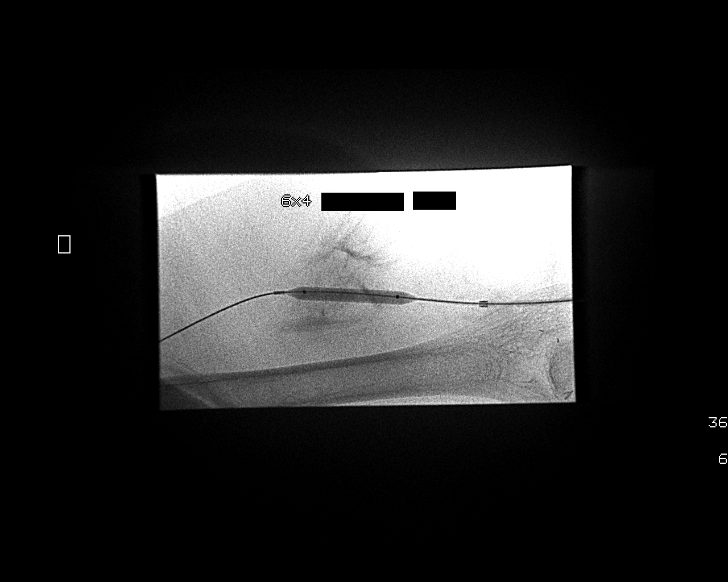
[im 42/65]
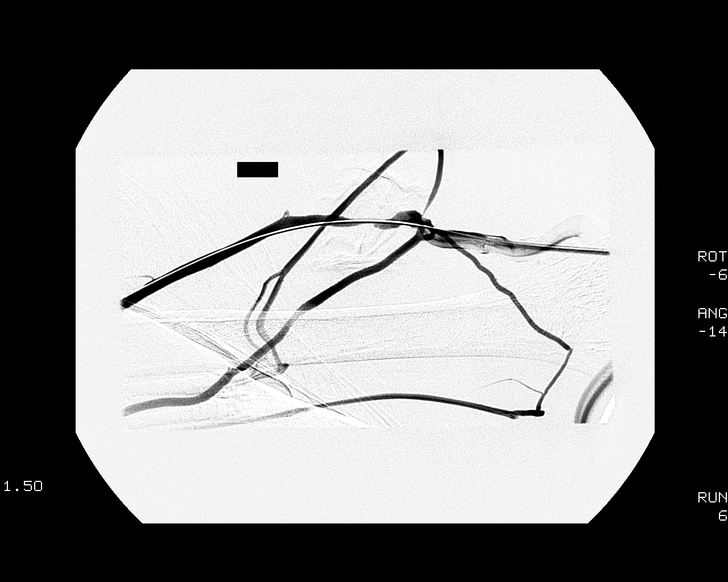
[im 48/65]
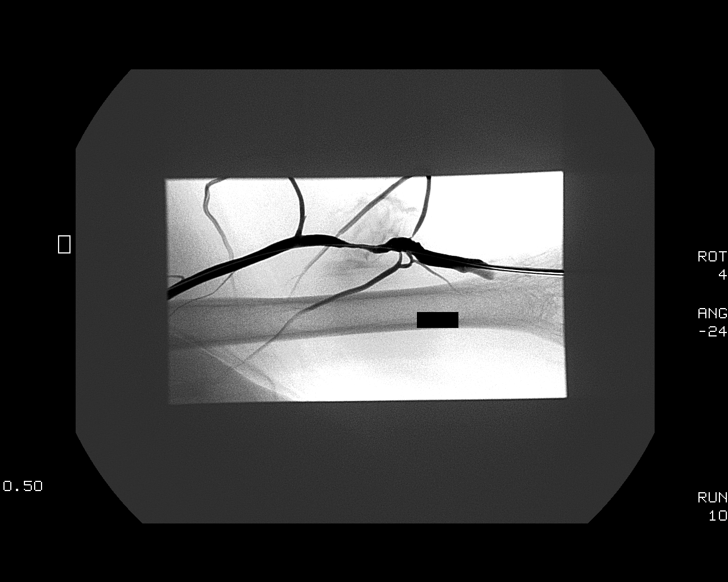
[im 53/65]
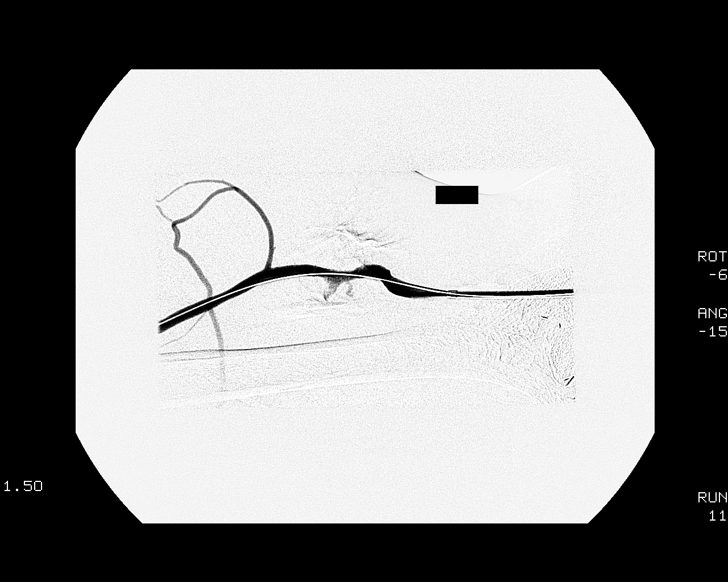
[im 59/65]
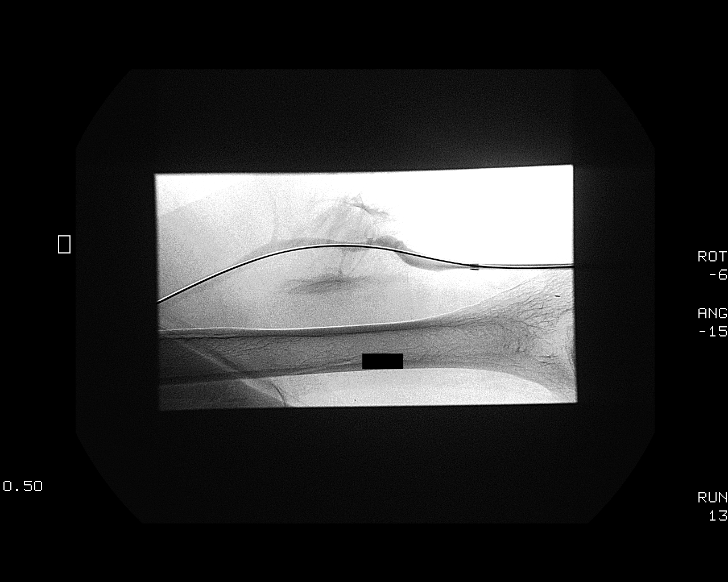
[im 65/65]
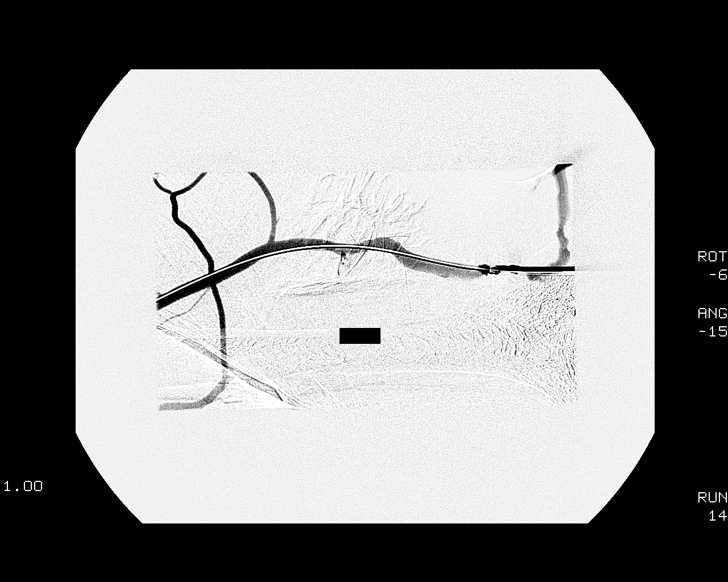

[12 of 24 positions shown; findings below may reference images not displayed]

IMPRESSION: 1.  Stenosis involving the draining cephalic vein which was successfully treated with a 6 mm Conquest balloon.  There was a small amount of contrast extravasation following angioplasty which essentially resolved by the end of the procedure.  No significant hematoma formation.
2.  Poorly positioned left dialysis catheter.  The tip of the catheter is at the junction of the innominate and SVC and probably explains the patient?s difficulty with dialysis.  The patient did not want to have the catheter exchanged today.  The patient has been scheduled for an exchange of the dialysis catheter in three days.

## 2008-04-14 ENCOUNTER — Telehealth: Payer: Self-pay | Admitting: Internal Medicine

## 2008-04-15 IMAGING — CT CT HEAD W/O CM
1 series · 16 of 30 positions shown, 20 images · non-contrast
Comparison: 03/02/06.

CLINICAL DATA: Emergency Department patient with seizure today. History of ESRD.
HEAD CT WITHOUT CONTRAST ? 04/15/06:
TECHNIQUE: Contiguous axial CT images were obtained from the base of the skull through the vertex according to standard protocol without contrast.

[Series 2: routinehead 4.8 h45s · axial · 0.46mm/px · z∈[-93,+42]mm · 16 of 30 slices shown, 20 images]
[im 2/30  brain]
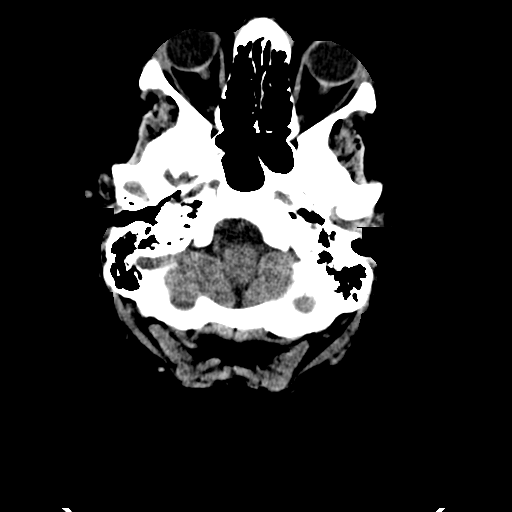
[im 2/30  bone]
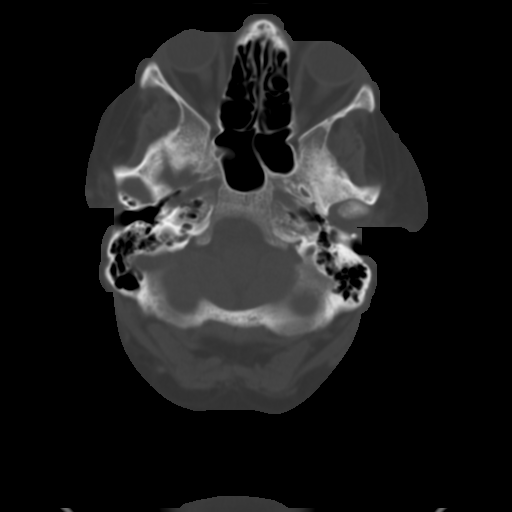
[im 4/30  brain]
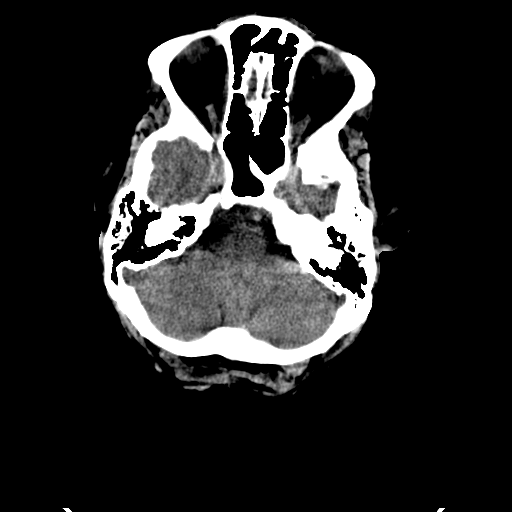
[im 6/30  brain]
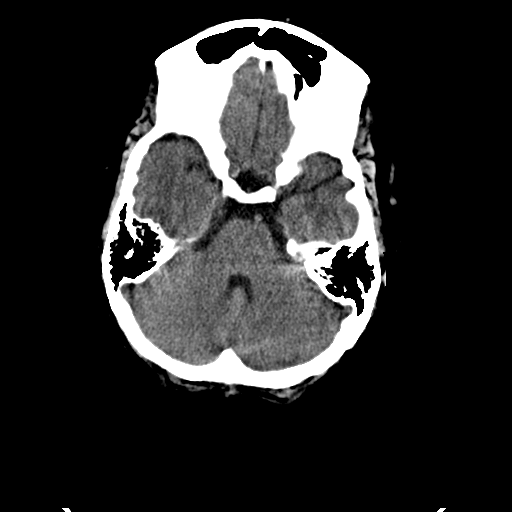
[im 8/30  brain]
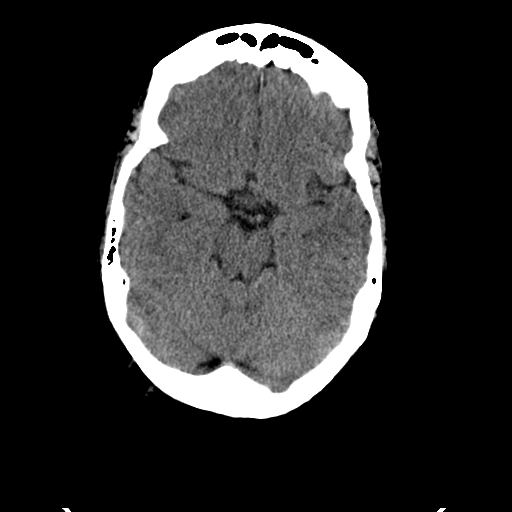
[im 9/30  brain]
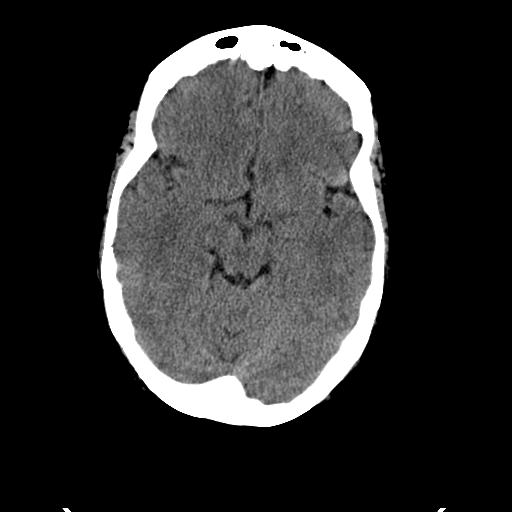
[im 9/30  bone]
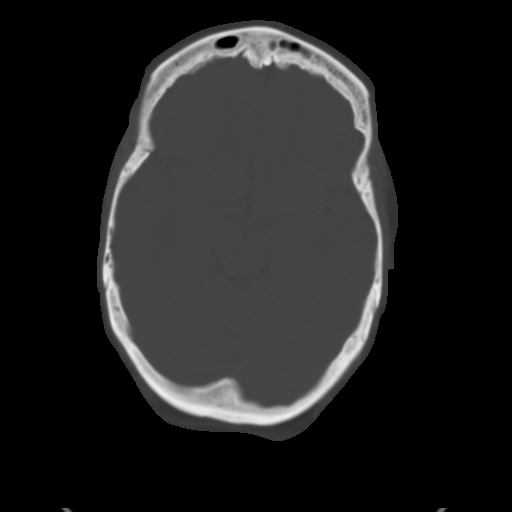
[im 11/30  brain]
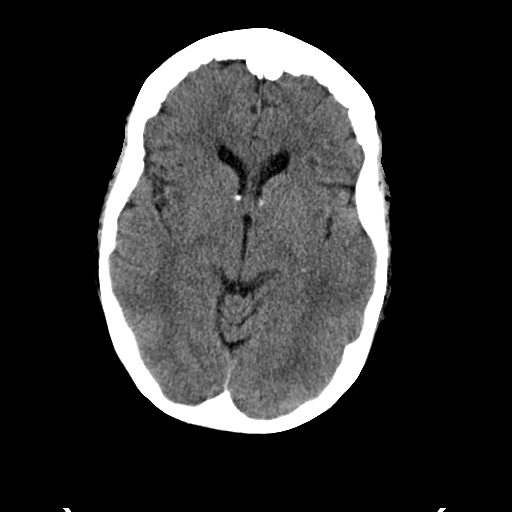
[im 13/30  brain]
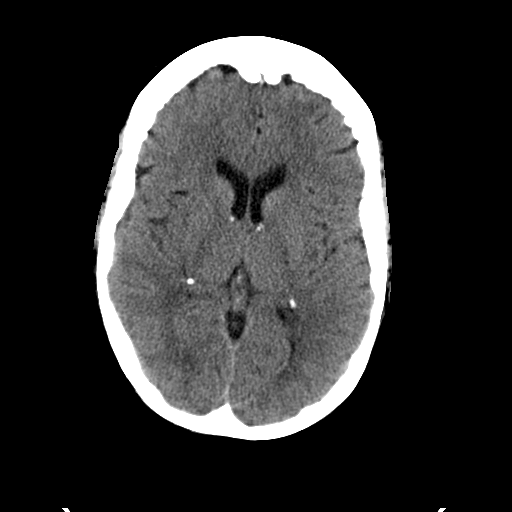
[im 15/30  brain]
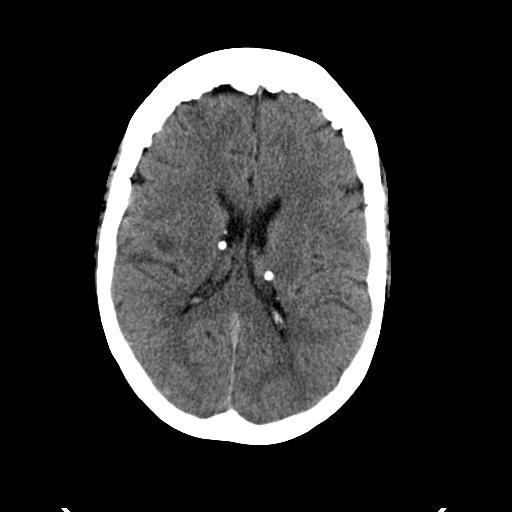
[im 16/30  brain]
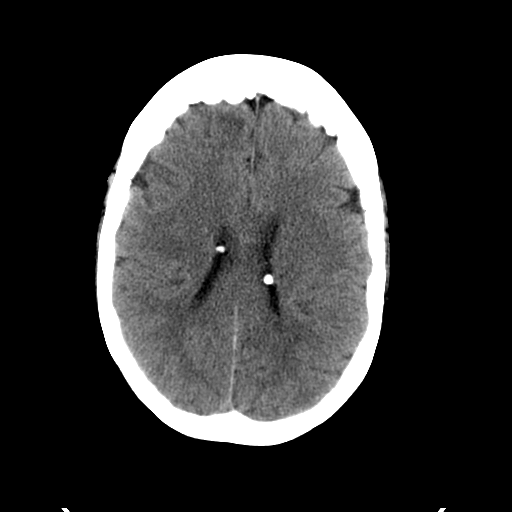
[im 16/30  bone]
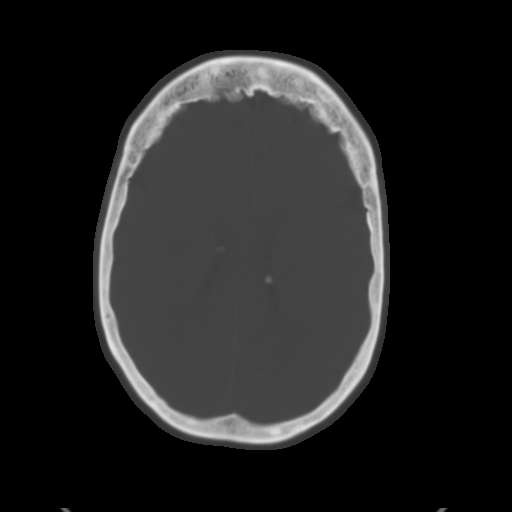
[im 18/30  brain]
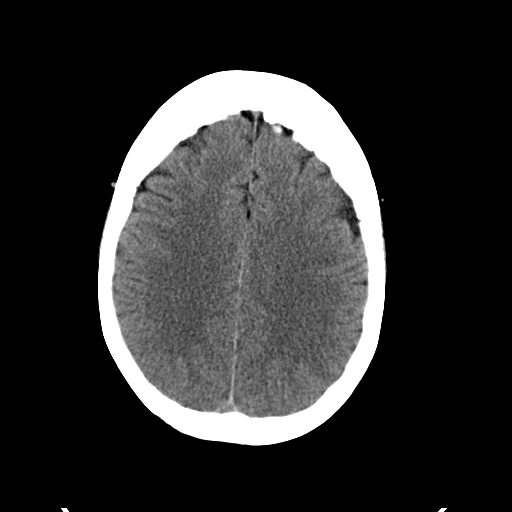
[im 20/30  brain]
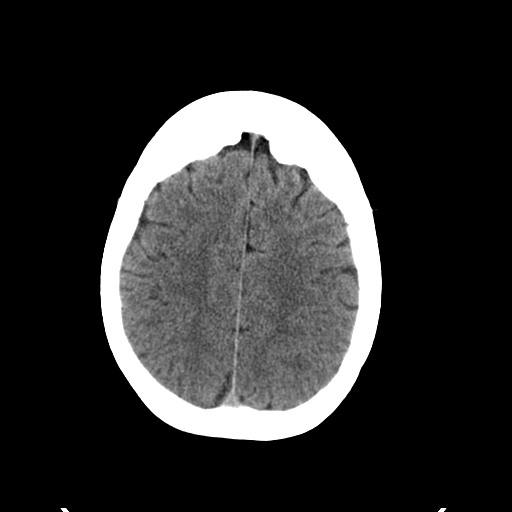
[im 22/30  brain]
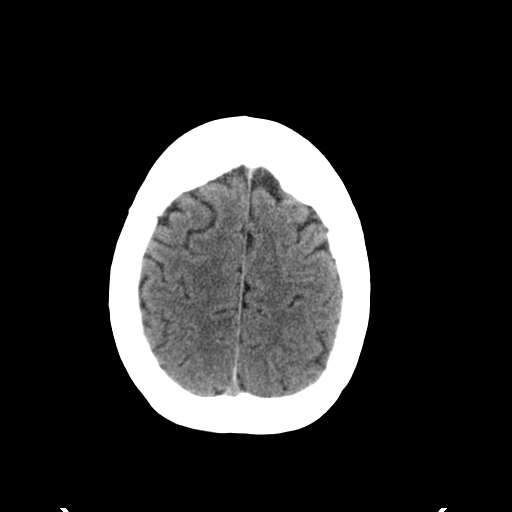
[im 23/30  brain]
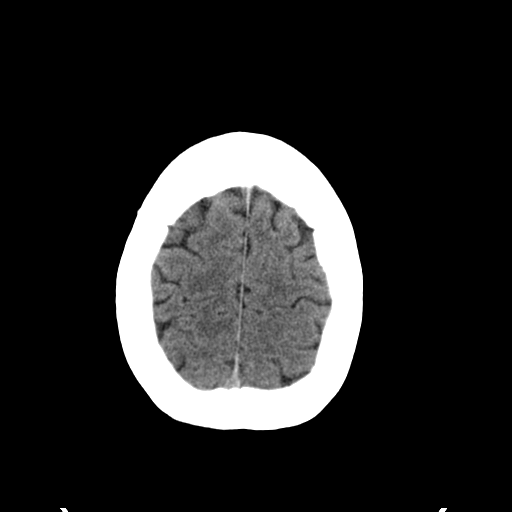
[im 23/30  bone]
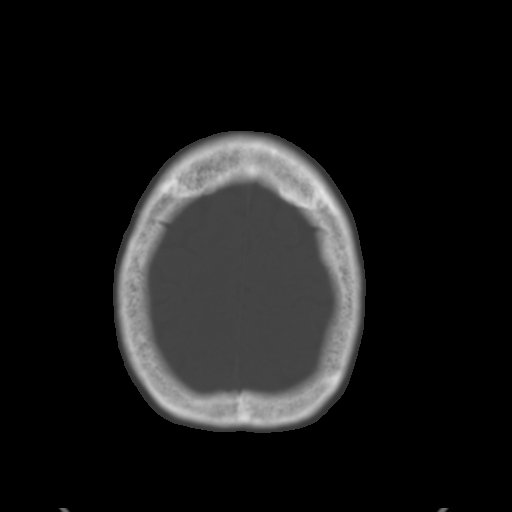
[im 25/30  brain]
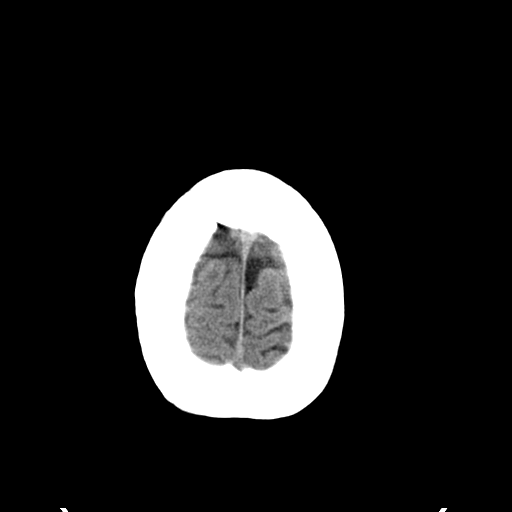
[im 27/30  brain]
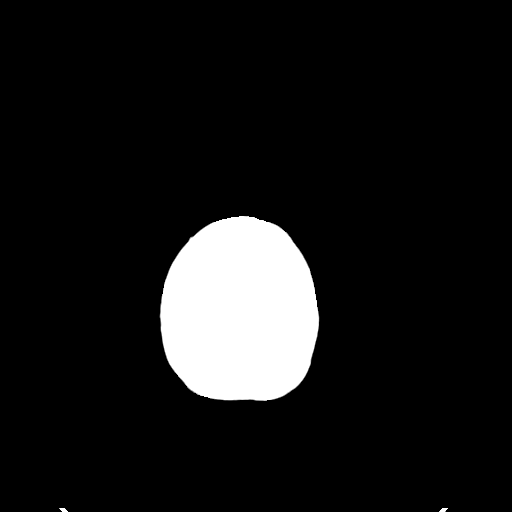
[im 29/30  brain]
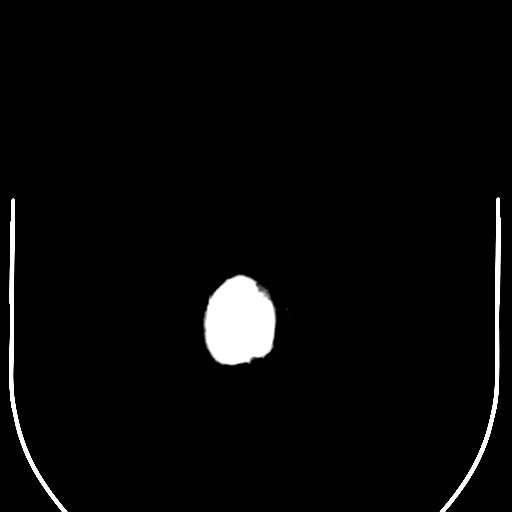

[16 of 30 positions shown; findings below may reference images not displayed]

FINDINGS: Again appreciated are scattered calcifications some of which appear to be related to the ependymal or subependymal layer of the ventricles, involving the posterior aspect of the frontal horns and the lateral ventricular bodies, plus the region of the atrial trigones.  The calcifications may be the result of remote TORCH infection as stated previously.  Also, consider tuberous sclerosis with calcified subependymal tubers/hamartomas.  
No intracranial hemorrhage, mass, edema, or findings to suggest an acute infarction.   Hyperostosis frontalis interna, a benign incidental finding, is again noted.
IMPRESSION: Intracranial calcifications ? See comments above. 
No acute findings.  No interval change.

## 2008-04-15 IMAGING — CR DG CHEST 2V
2 series · 2 of 2 positions shown · non-contrast
Comparison: 02/01/06.

CLINICAL DATA: Seizures, syncope.
 CHEST - 2 VIEW - 04/15/06:

[w chest pa]
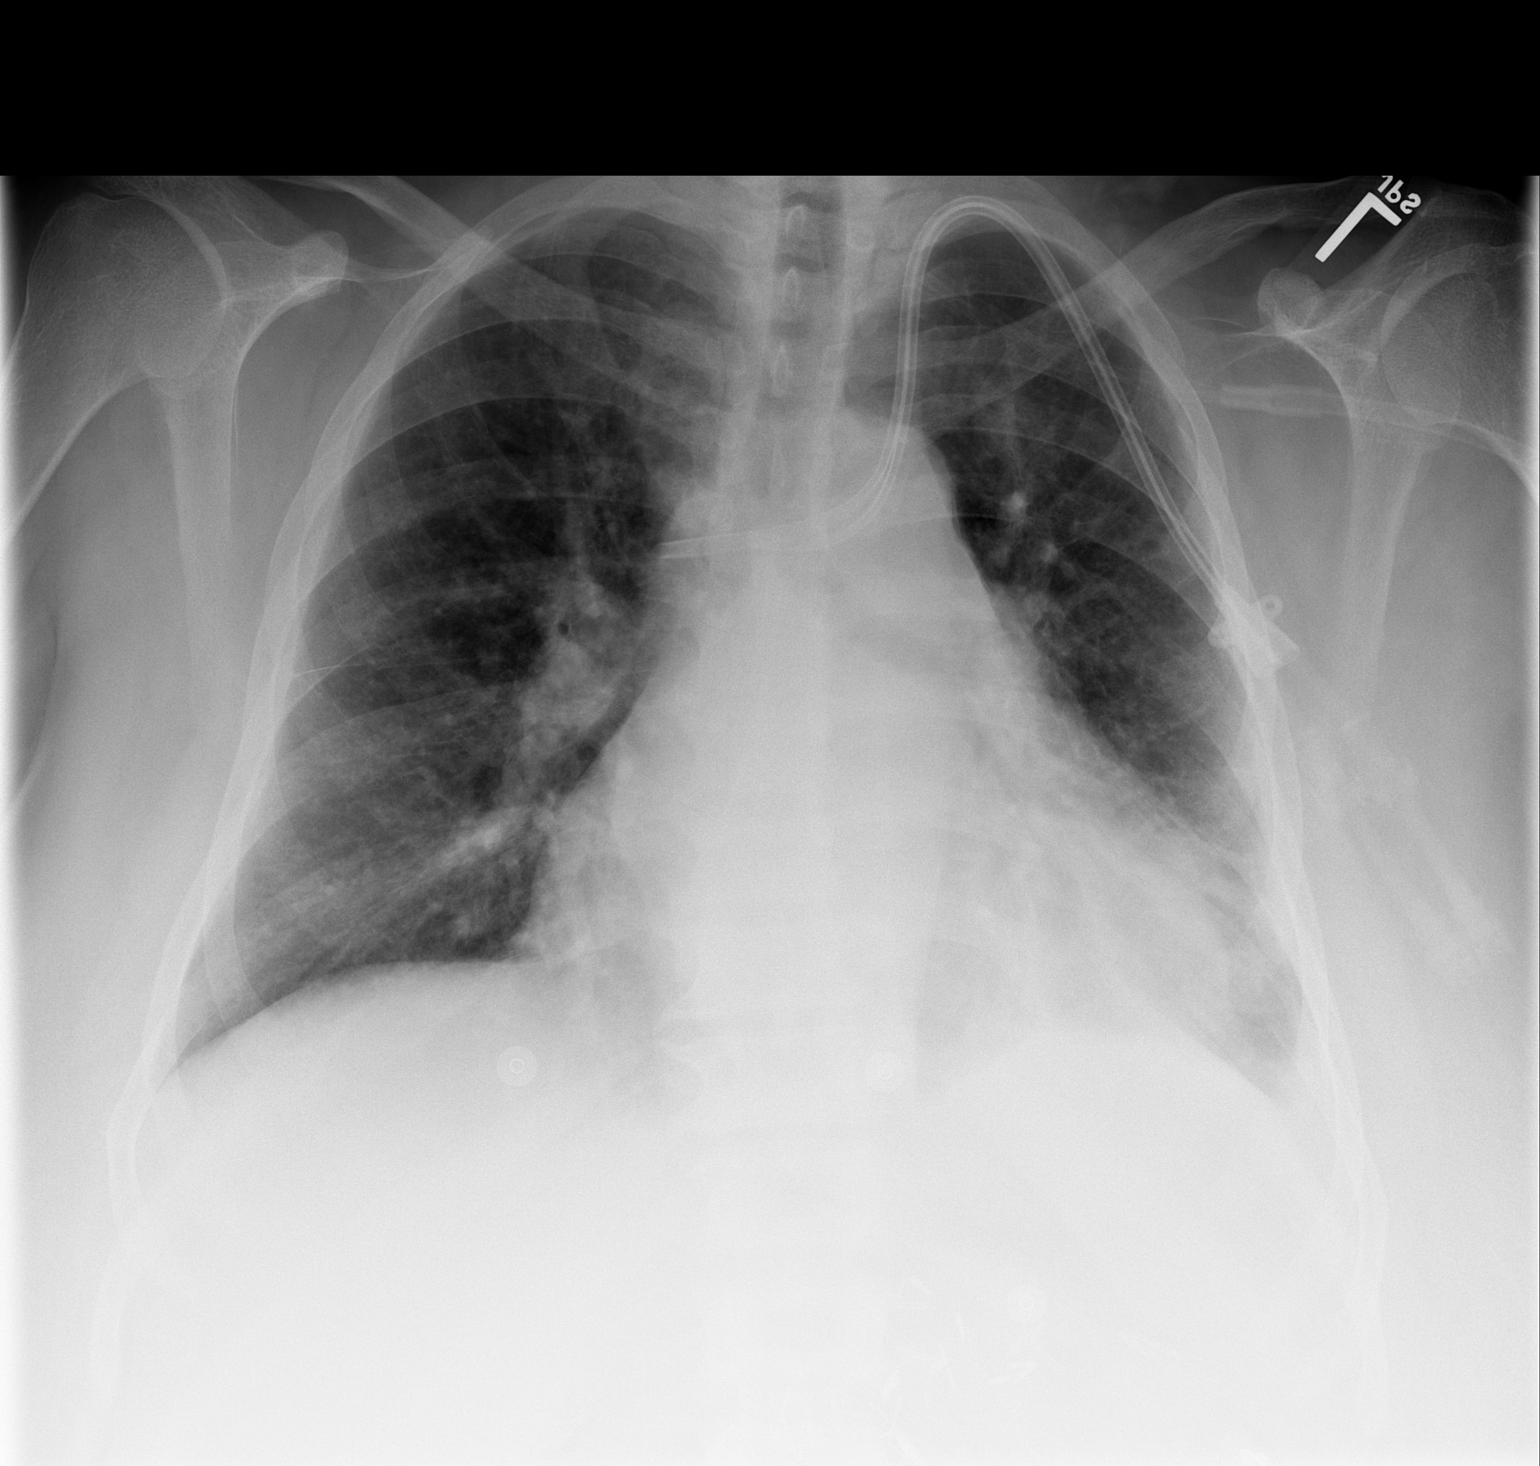

[w chest lat]
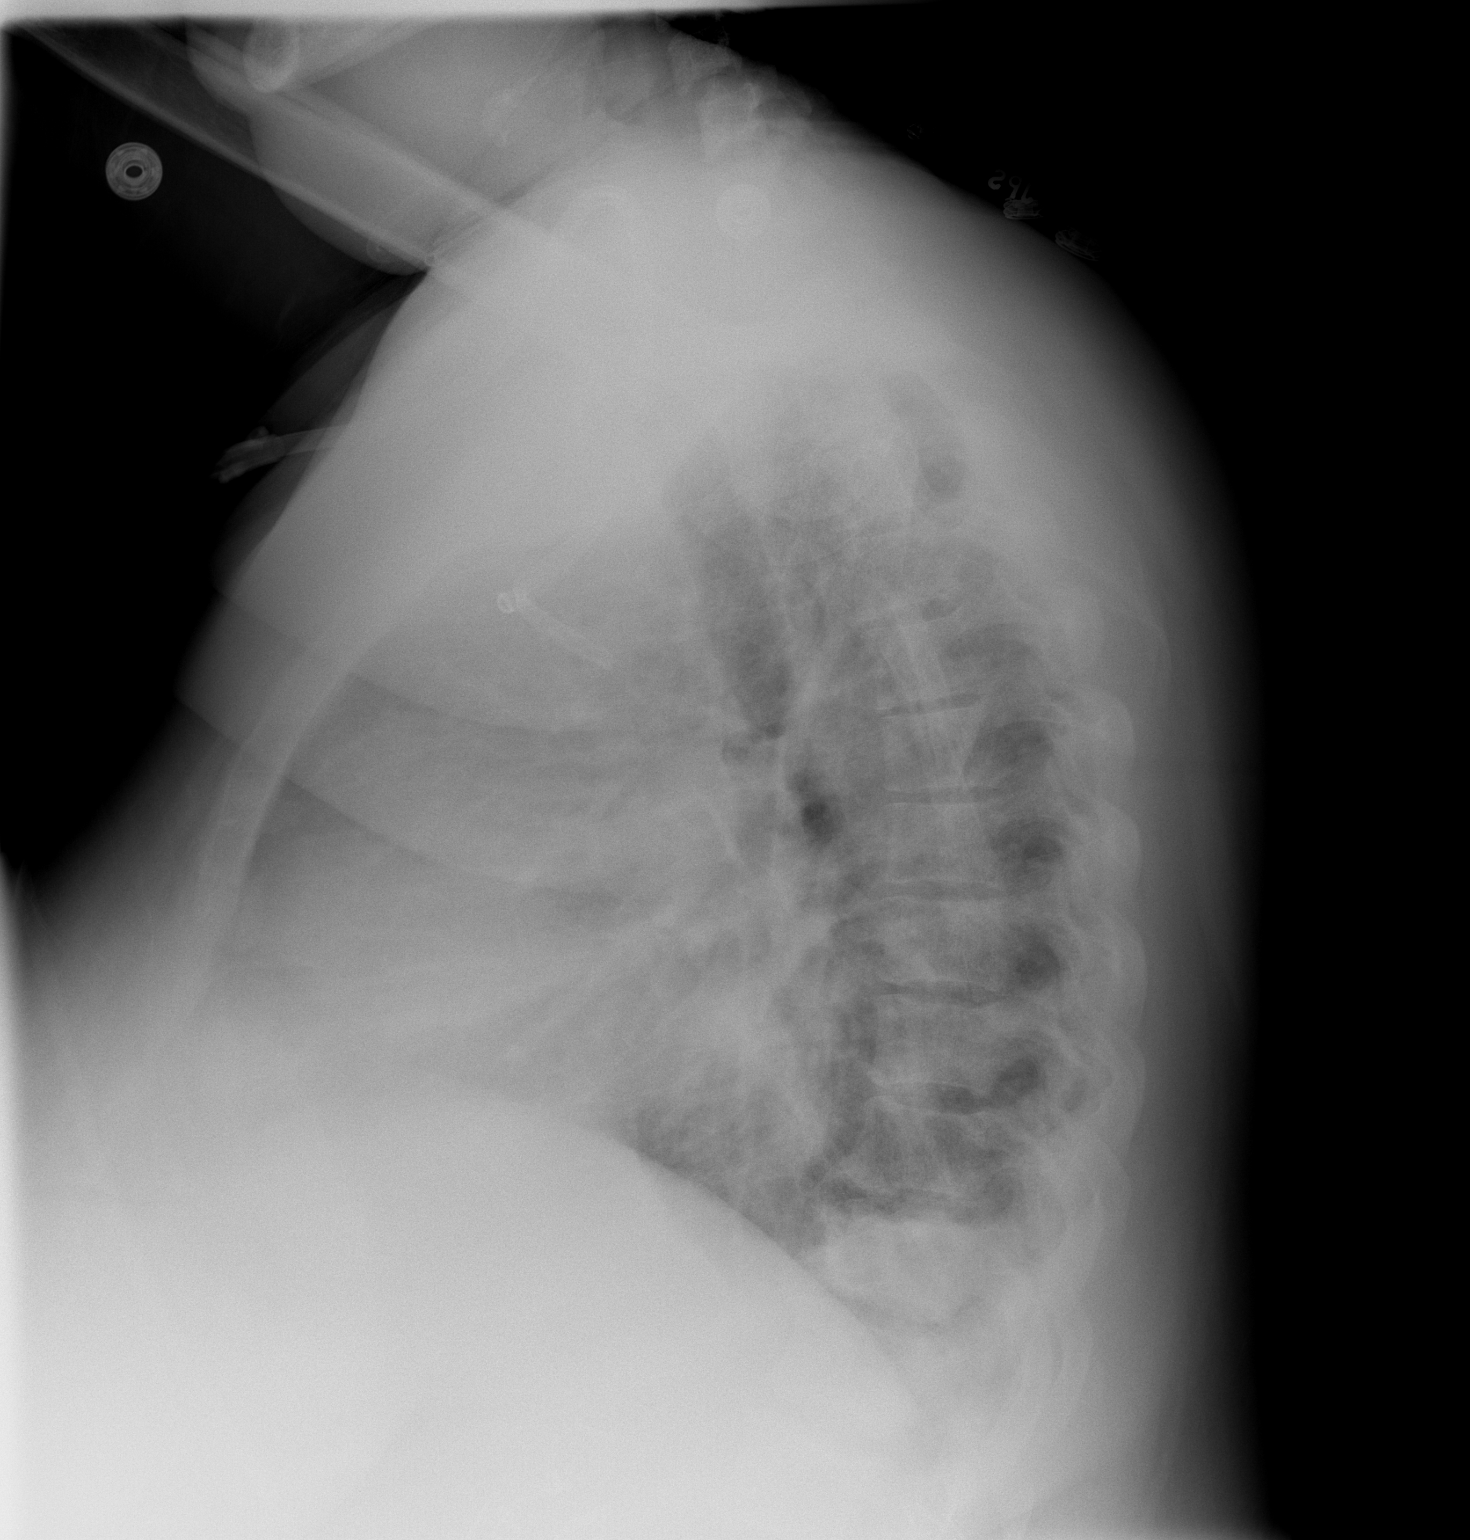

[2 of 2 positions shown; findings below may reference images not displayed]

FINDINGS: The heart size is mildly enlarged, stable.  There is mild interstitial prominence and indistinctness.  Left lower lobe scar or atelectasis.  Small left pleural effusion.
IMPRESSION: 1.  Mild congestive heart failure.  
 2.  Left basilar atelectasis versus scar.

## 2008-04-16 ENCOUNTER — Ambulatory Visit (HOSPITAL_COMMUNITY): Admission: RE | Admit: 2008-04-16 | Discharge: 2008-04-16 | Payer: Self-pay | Admitting: Nephrology

## 2008-04-19 IMAGING — CR DG CHEST 1V PORT
1 series · 1 of 1 positions shown · non-contrast
Comparison: none

CLINICAL DATA: Diatek catheter placement.
 PORTABLE CHEST - 1 VIEW - 04/19/06 AT 1089 HOURS:

[view not recorded]
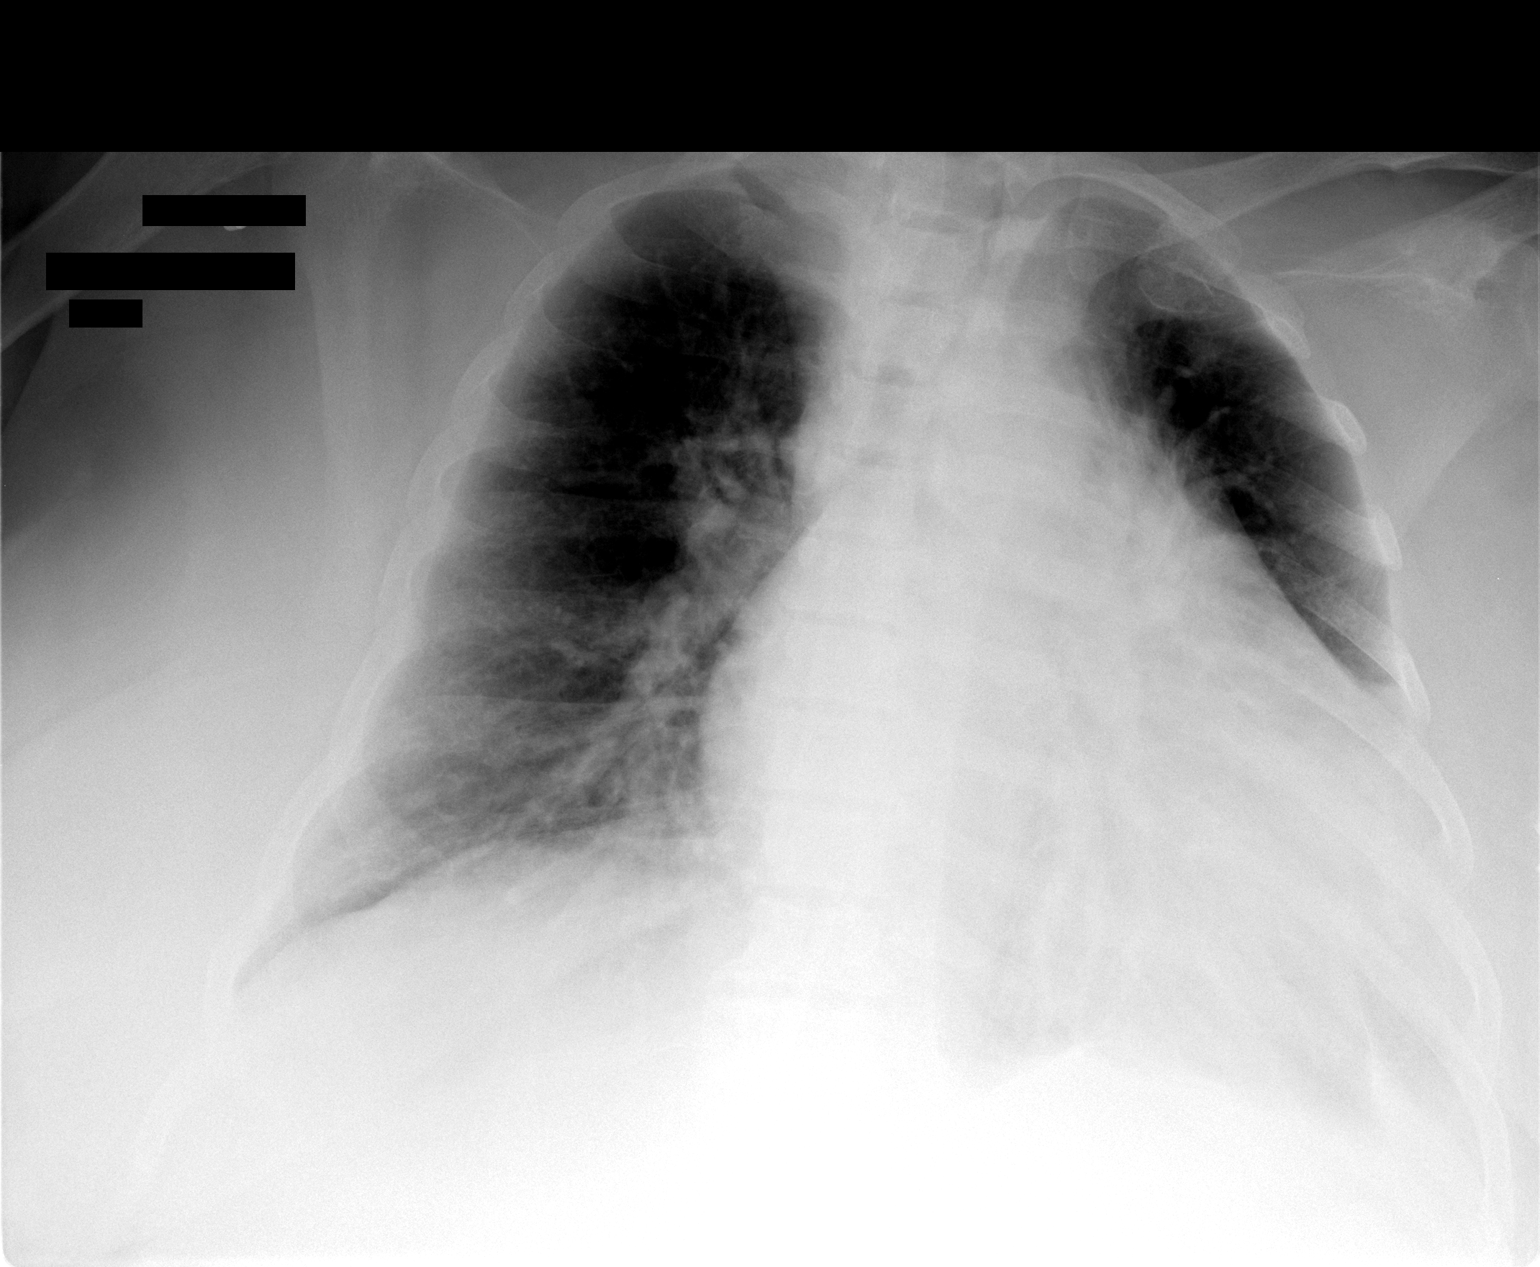

[1 of 1 positions shown; findings below may reference images not displayed]

FINDINGS: The left IJ tunneled dialysis catheter has been removed.  There is no pneumothorax.  The heart is enlarged.  Vascular congestion without edema is present.
IMPRESSION: Cardiomegaly and vascular congestion.

## 2008-04-21 ENCOUNTER — Ambulatory Visit: Payer: Self-pay | Admitting: Licensed Clinical Social Worker

## 2008-04-21 ENCOUNTER — Ambulatory Visit: Payer: Self-pay | Admitting: Internal Medicine

## 2008-04-21 DIAGNOSIS — F332 Major depressive disorder, recurrent severe without psychotic features: Secondary | ICD-10-CM | POA: Insufficient documentation

## 2008-04-22 IMAGING — XA IR US GUIDE VASC ACCESS RIGHT
1 series · 1 of 1 positions shown · non-contrast
Comparison: none

CLINICAL DATA: The patient needs a new tunneled dialysis catheter. 
 FLUOROSCOPIC AND ULTRASOUND-GUIDED PLACEMENT OF A TUNNELED DIALYSIS CATHETER:
 Physician:  Dr. Rambriksh Tiger.
 Sedation:  4.5 mg Versed, 225 mcg fentanyl. 
 Procedure:  Informed consent was obtained.  The patient?s right neck was evaluated with ultrasound, demonstrating a patent internal jugular vein.  The right neck and right upper chest were prepped and draped in a sterile fashion.  The skin was anesthetized with 1% lidocaine.  A 20-gauge needle was directed into the right internal jugular vein with ultrasound guidance.  A wire was advanced centrally, and a micropuncture dilator set was placed.  A 23-cm tip to cuff HemoSplit catheter was selected.  A small skin incision was made below the right clavicle, and a subcutaneous tunnel was formed toward the micropuncture dilator.  The catheter was brought through the subcutaneous tunnel, and the micropuncture dilator was exchanged for multiple dilators over an Amplatz wire.  The catheter was then placed through the valve peel-away sheath, and the tip was placed in the right atrium.  Both lumens were found to aspirate and flush well.  The venotomy site was closed using suture, and the catheter was secured to the skin using suture.  Sterile dressings were applied.

[Series 1: run · 1 of 1 slices shown]
[im 1/1]
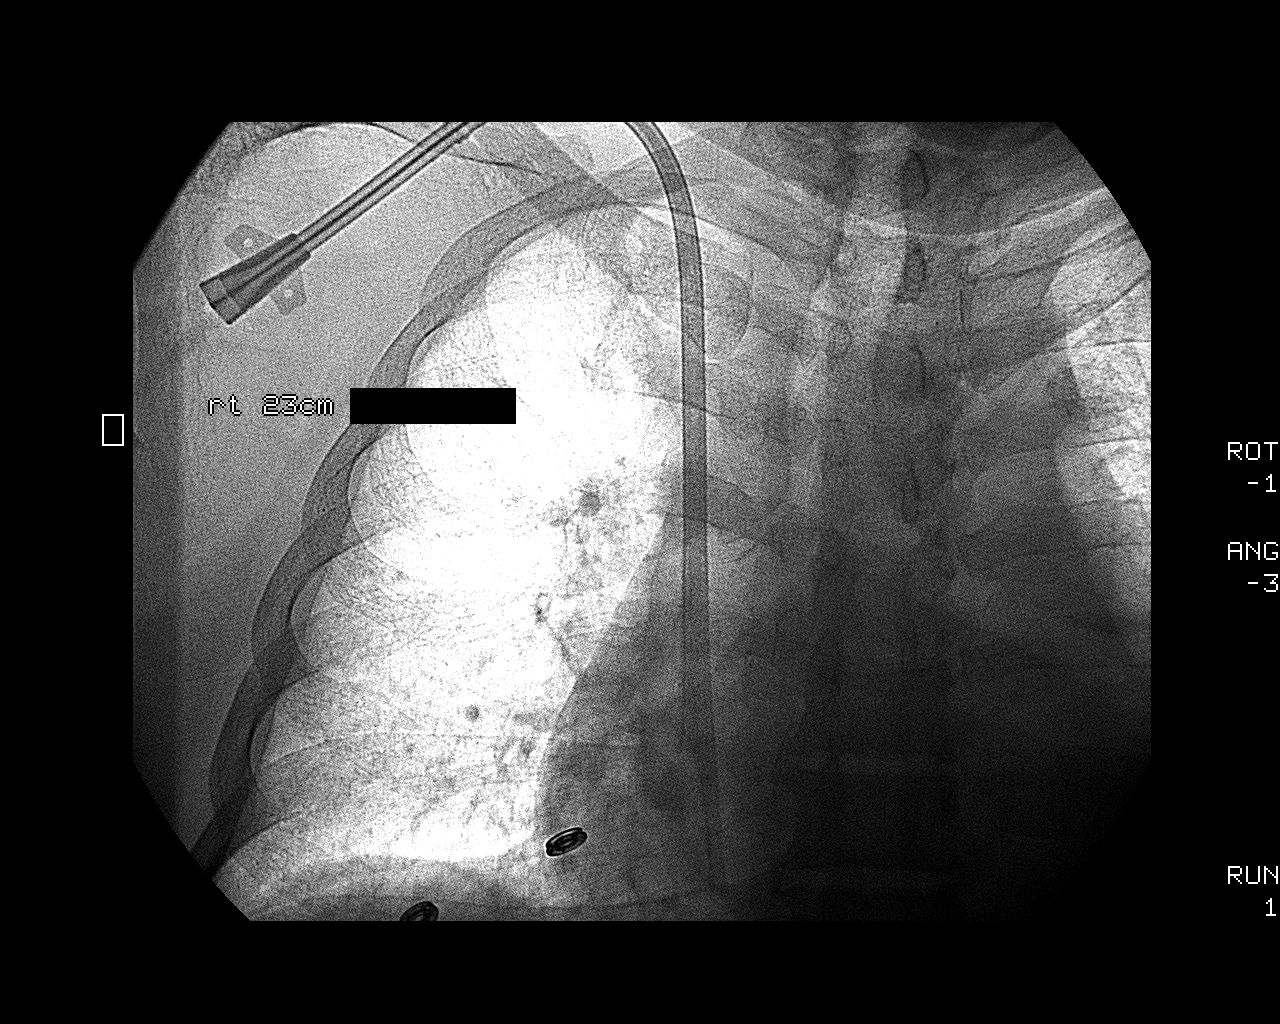

[1 of 1 positions shown; findings below may reference images not displayed]

IMPRESSION: 1.  Successful placement of a right internal jugular dialysis catheter with fluoroscopic and ultrasound guidance. 
 2.  The sutures need to be removed in 10-14 days.

## 2008-04-23 IMAGING — CR DG CHEST 2V
2 series · 2 of 2 positions shown · non-contrast
Comparison: 04/19/06 and 04/15/06.

CLINICAL DATA: 49-year-old female, endstage renal disease, dialysis catheter insertion.
 CHEST ? 2 VIEW ? 04/23/06:

[w chest pa *]
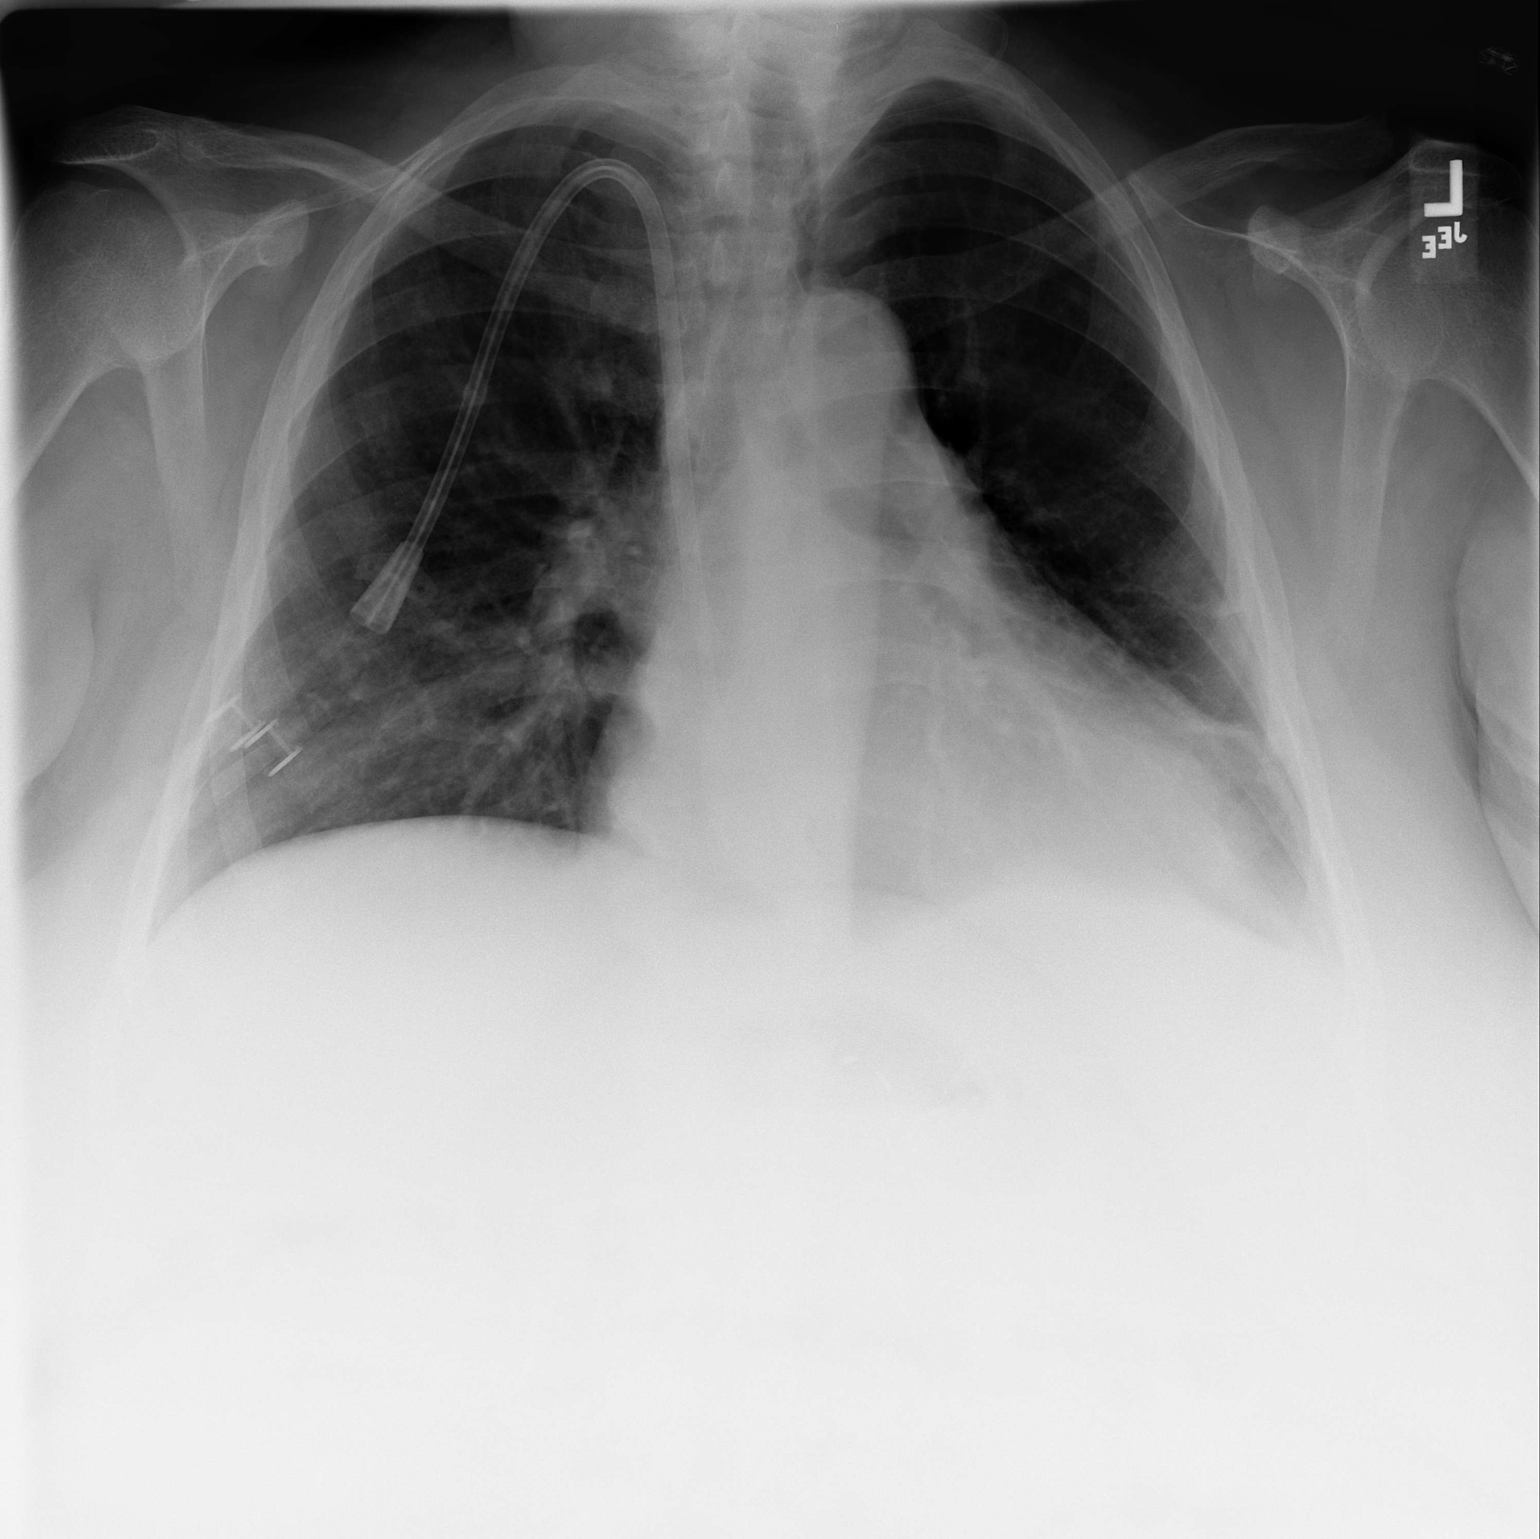

[w chest lat]
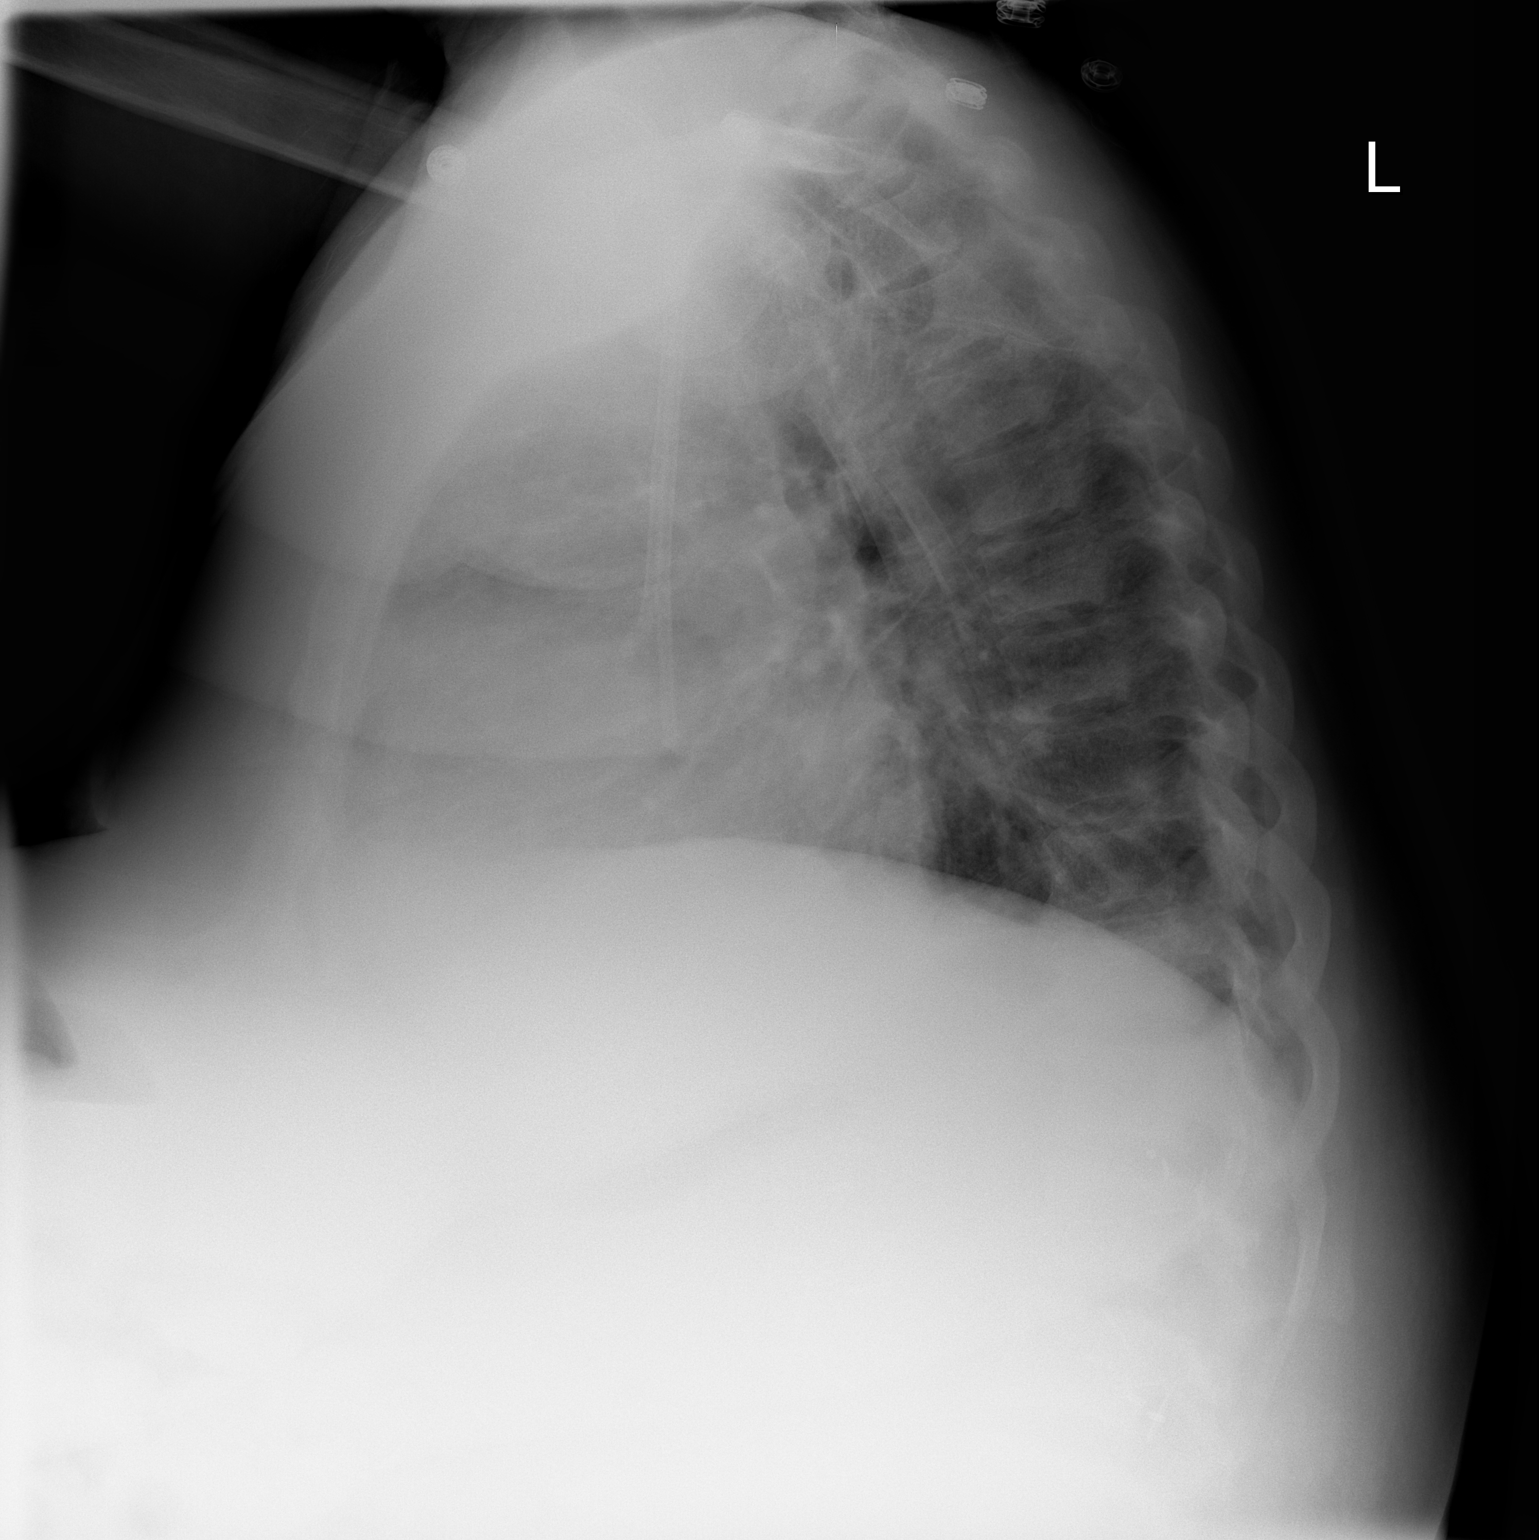

[2 of 2 positions shown; findings below may reference images not displayed]

FINDINGS: New right IJ dialysis catheter tips are in the SVC/RA junction.  The heart is enlarged with central vascular congestion, bronchial thickening, and bibasilar atelectasis, worse on the left.  No significant effusion or pneumothorax.
IMPRESSION: 1.  New right IJ dialysis catheter with the tips in the SVC/RA junction. 
 2.  Cardiomegaly, mild vascular congestion, and bibasilar atelectasis.
 3.  No pneumothorax.

## 2008-04-26 ENCOUNTER — Telehealth: Payer: Self-pay | Admitting: Family Medicine

## 2008-05-05 ENCOUNTER — Telehealth: Payer: Self-pay | Admitting: Internal Medicine

## 2008-05-15 ENCOUNTER — Inpatient Hospital Stay (HOSPITAL_COMMUNITY): Admission: EM | Admit: 2008-05-15 | Discharge: 2008-05-24 | Payer: Self-pay | Admitting: Emergency Medicine

## 2008-05-15 ENCOUNTER — Ambulatory Visit: Payer: Self-pay | Admitting: Surgery

## 2008-05-15 ENCOUNTER — Ambulatory Visit: Payer: Self-pay | Admitting: Pulmonary Disease

## 2008-05-19 IMAGING — CT CT HEAD W/O CM
1 series · 16 of 30 positions shown, 20 images · non-contrast
Comparison: none

HISTORY: Fall striking back of head, history seizures

[Series 2: head routine 4.8 h47s · axial · 0.48mm/px · z∈[-98,+39]mm · 16 of 30 slices shown, 20 images]
[im 2/30  brain]
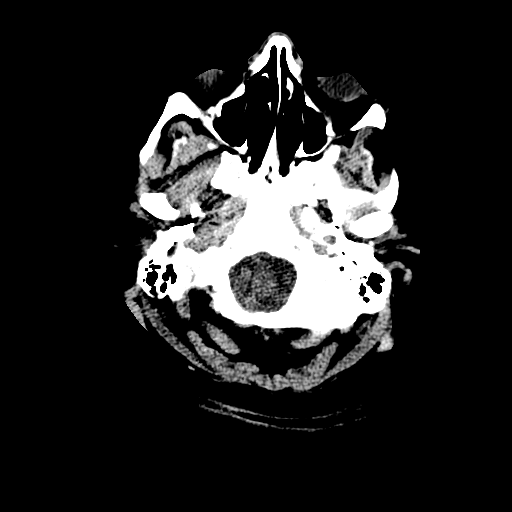
[im 2/30  bone]
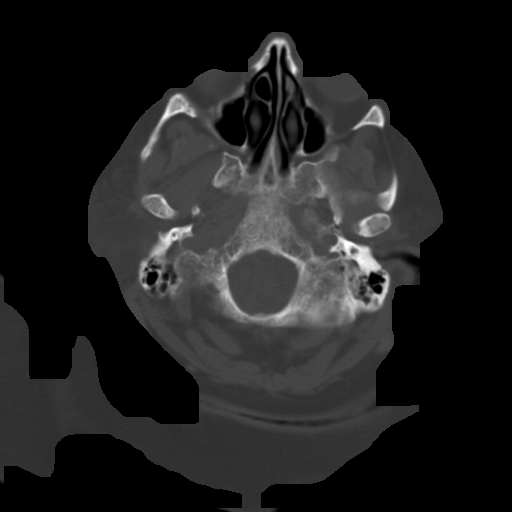
[im 4/30  brain]
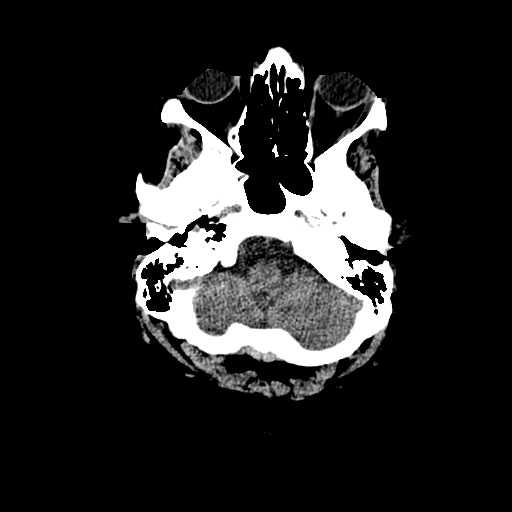
[im 6/30  brain]
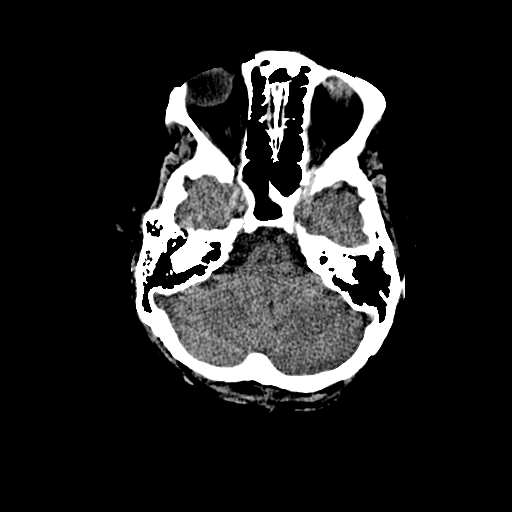
[im 8/30  brain]
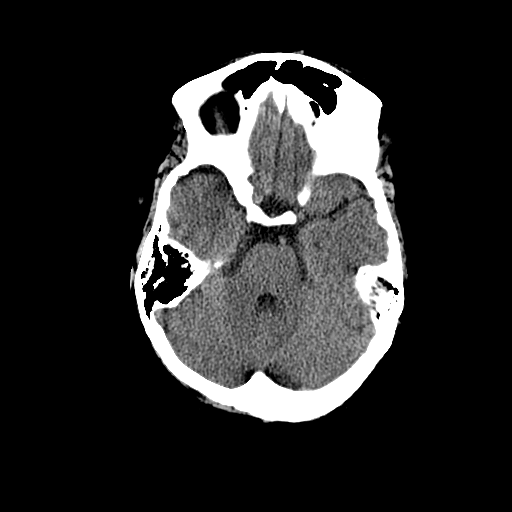
[im 9/30  brain]
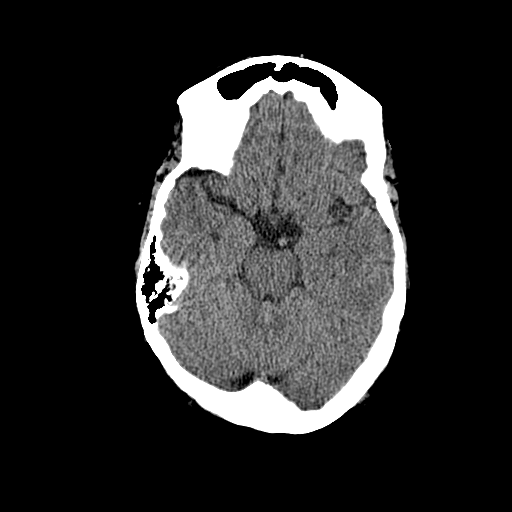
[im 9/30  bone]
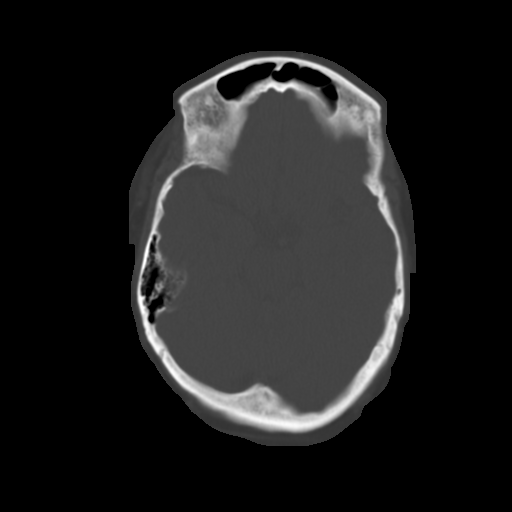
[im 11/30  brain]
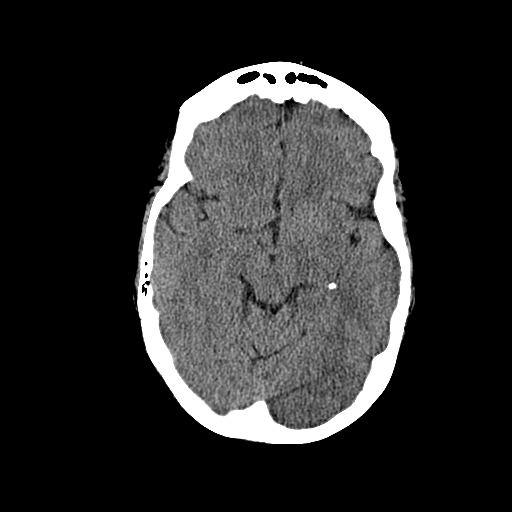
[im 13/30  brain]
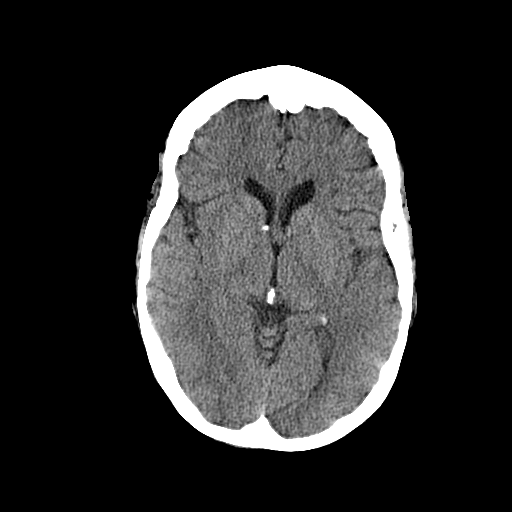
[im 15/30  brain]
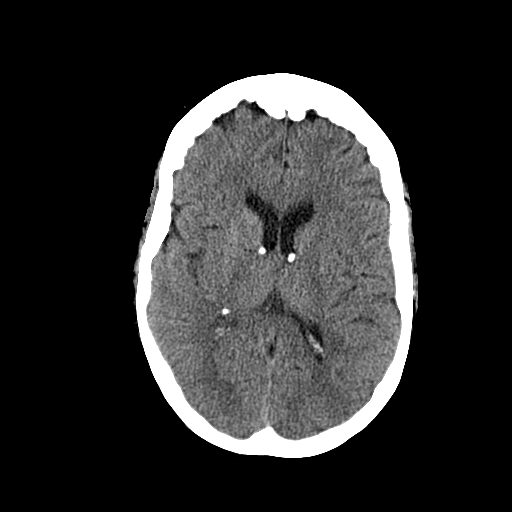
[im 16/30  brain]
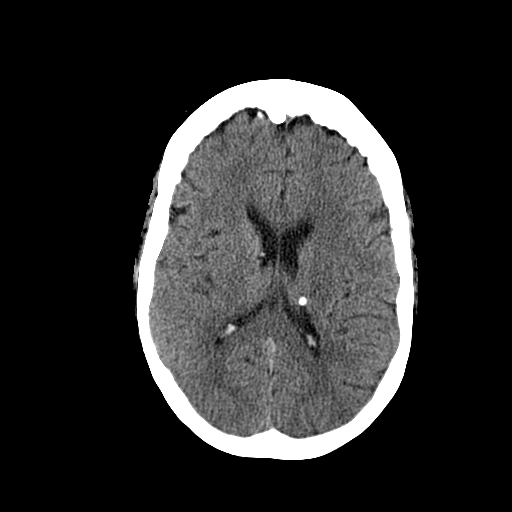
[im 16/30  bone]
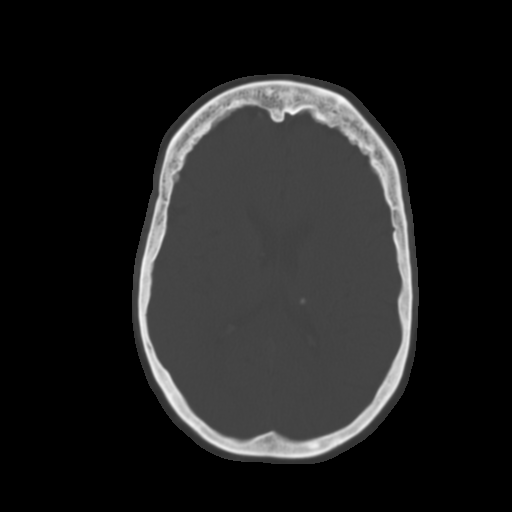
[im 18/30  brain]
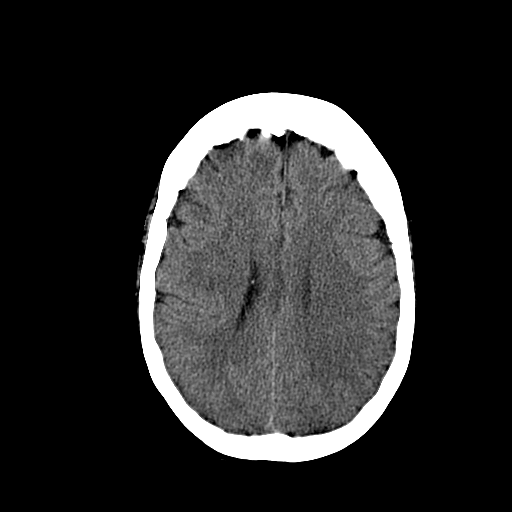
[im 20/30  brain]
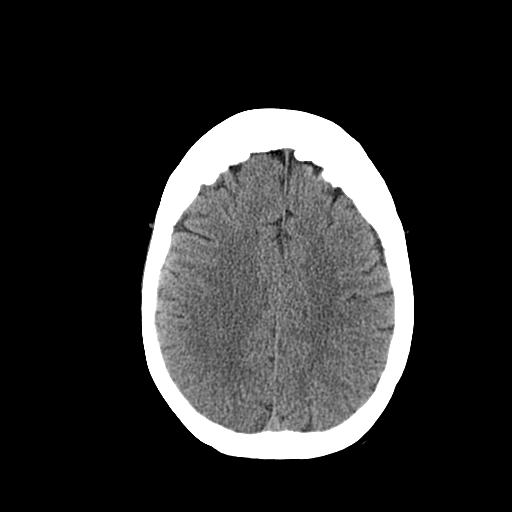
[im 22/30  brain]
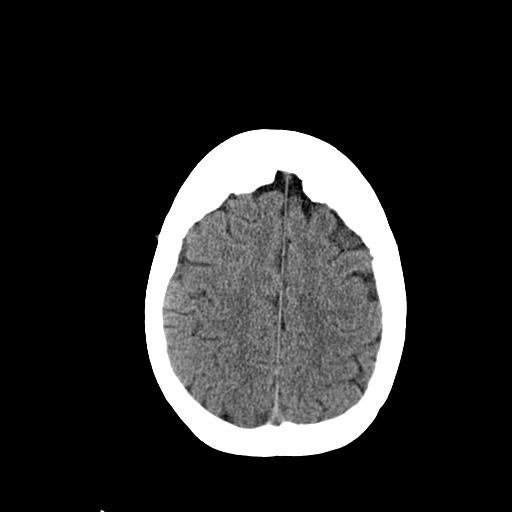
[im 23/30  brain]
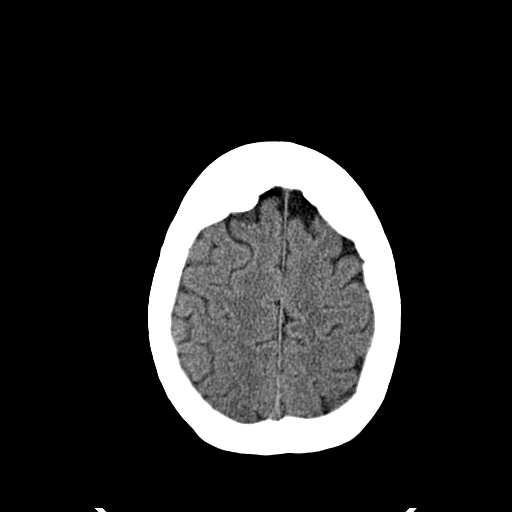
[im 23/30  bone]
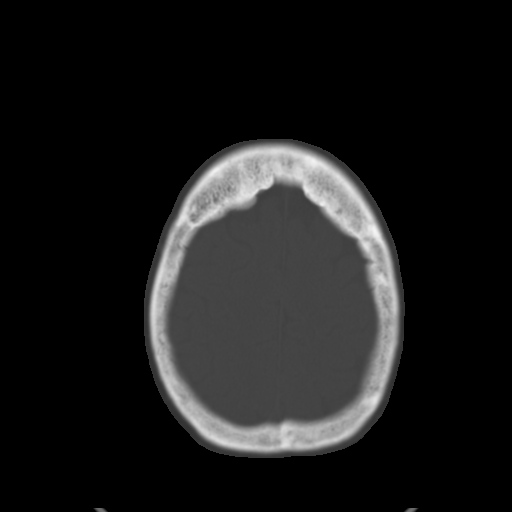
[im 25/30  brain]
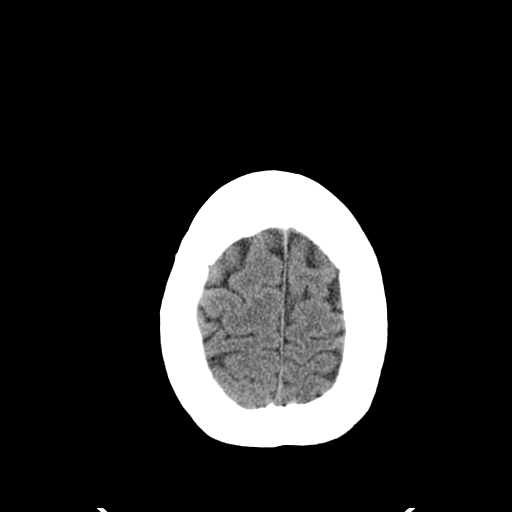
[im 27/30  brain]
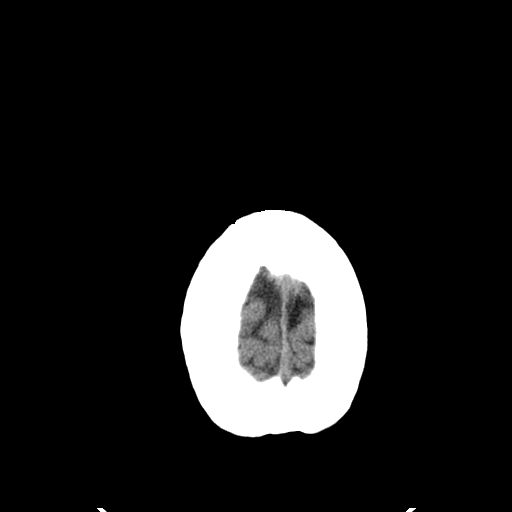
[im 29/30  brain]
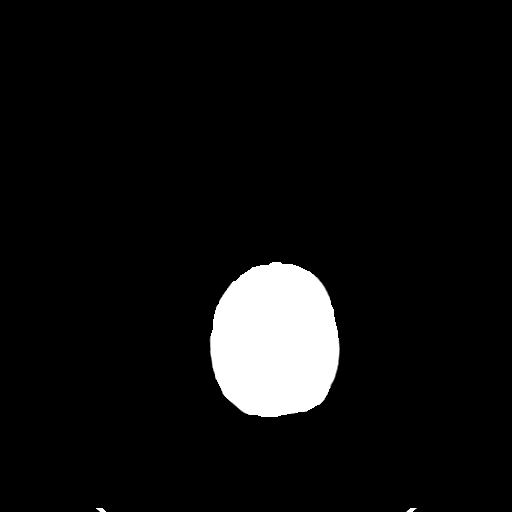

[16 of 30 positions shown; findings below may reference images not displayed]

CT HEAD WITHOUT CONTRAST:

Routine noncontrast CT head compared to 04/15/2006.

Minimal atrophy.
Normal ventricular morphology.
No midline shift or mass-effect.
Subependymal calcifications at ventricular system stable, and question tuberous
sclerosis or prior TORCH infection.
No new intracranial mass, hemorrhage, or acute infarct.
Visualized sinuses clear.
Mild hyperostosis frontalis interna.
IMPRESSION: No acute intracranial abnormalities.
Subependymal calcifications at ventricular system, question tuberous sclerosis
or remote TORCH infection.

## 2008-05-27 ENCOUNTER — Telehealth: Payer: Self-pay | Admitting: Internal Medicine

## 2008-05-28 ENCOUNTER — Telehealth: Payer: Self-pay | Admitting: Internal Medicine

## 2008-05-30 IMAGING — CR DG THORACIC SPINE 2V
4 series · 4 of 4 positions shown · non-contrast
Comparison: none

CLINICAL DATA: 50-year-old, fell. 
 THORACIC SPINE ? 3 VIEW:

[view not recorded (1 of 4)]
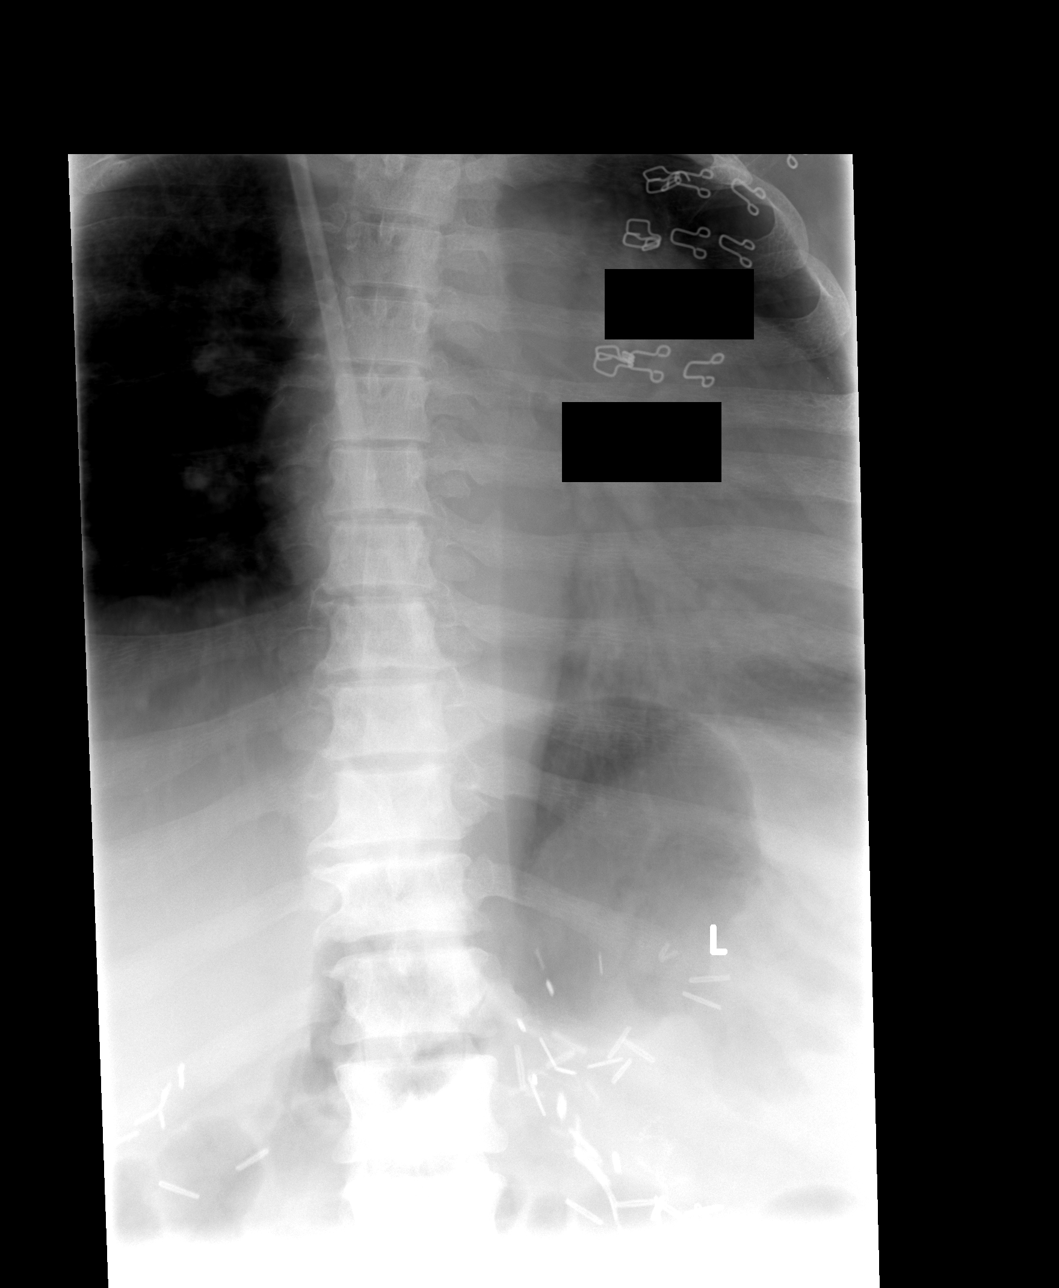

[view not recorded (2 of 4)]
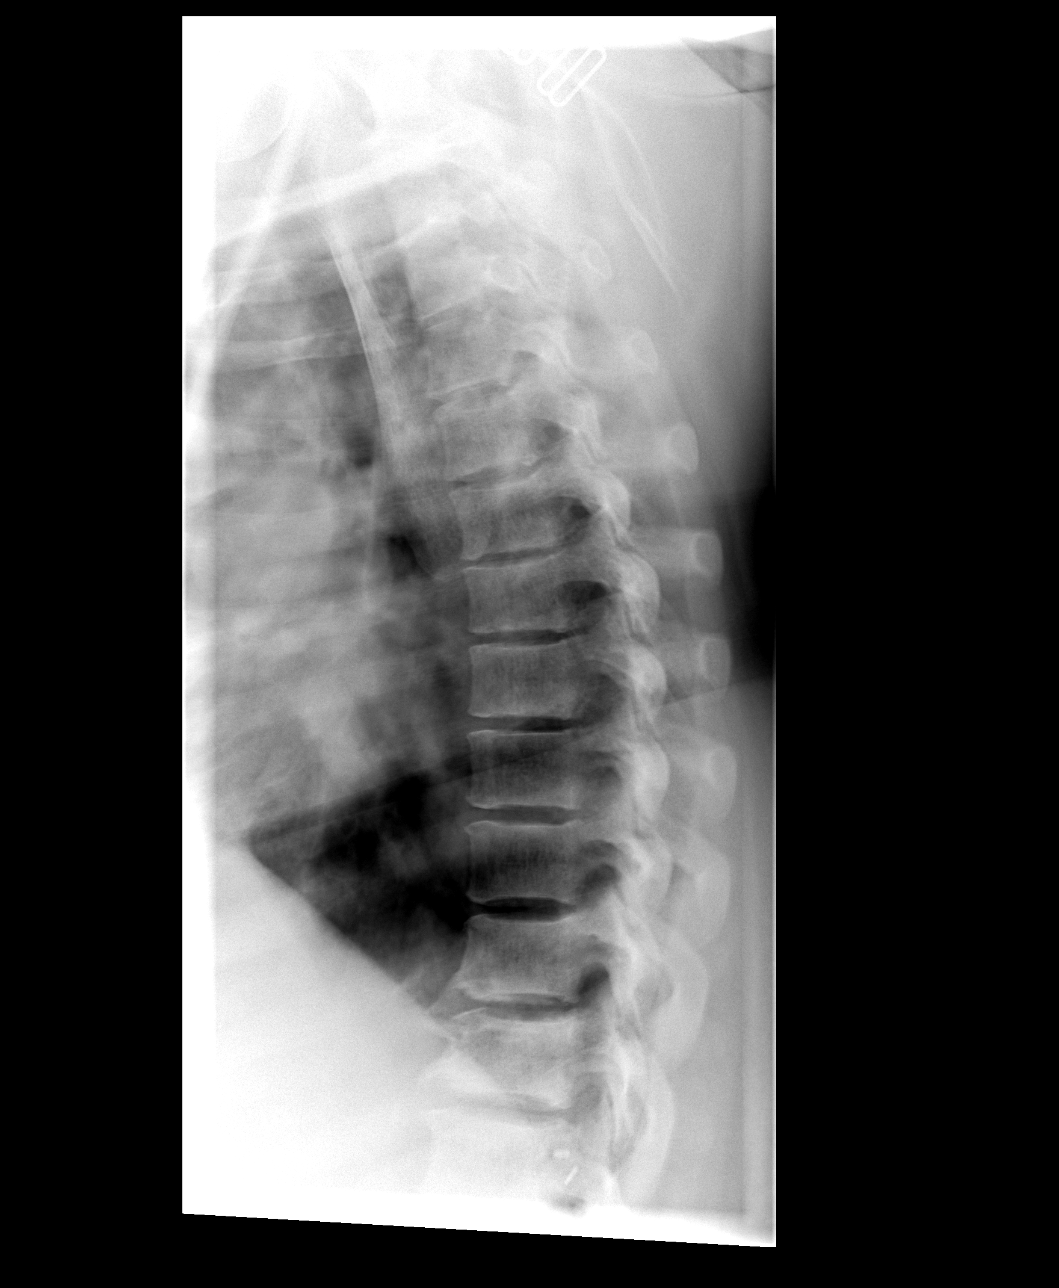

[view not recorded (3 of 4)]
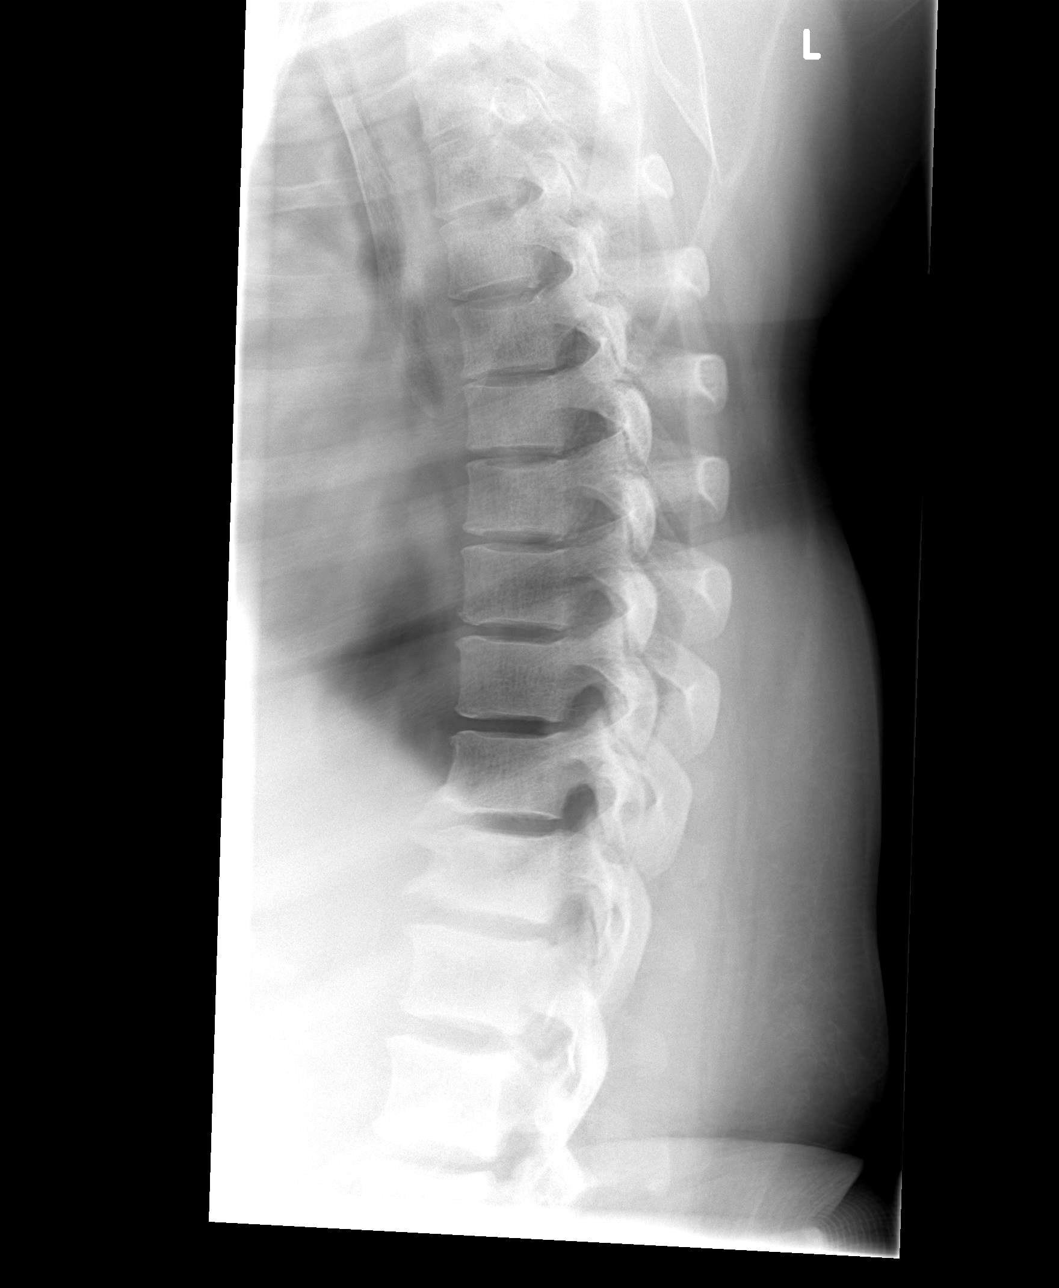

[view not recorded (4 of 4)]
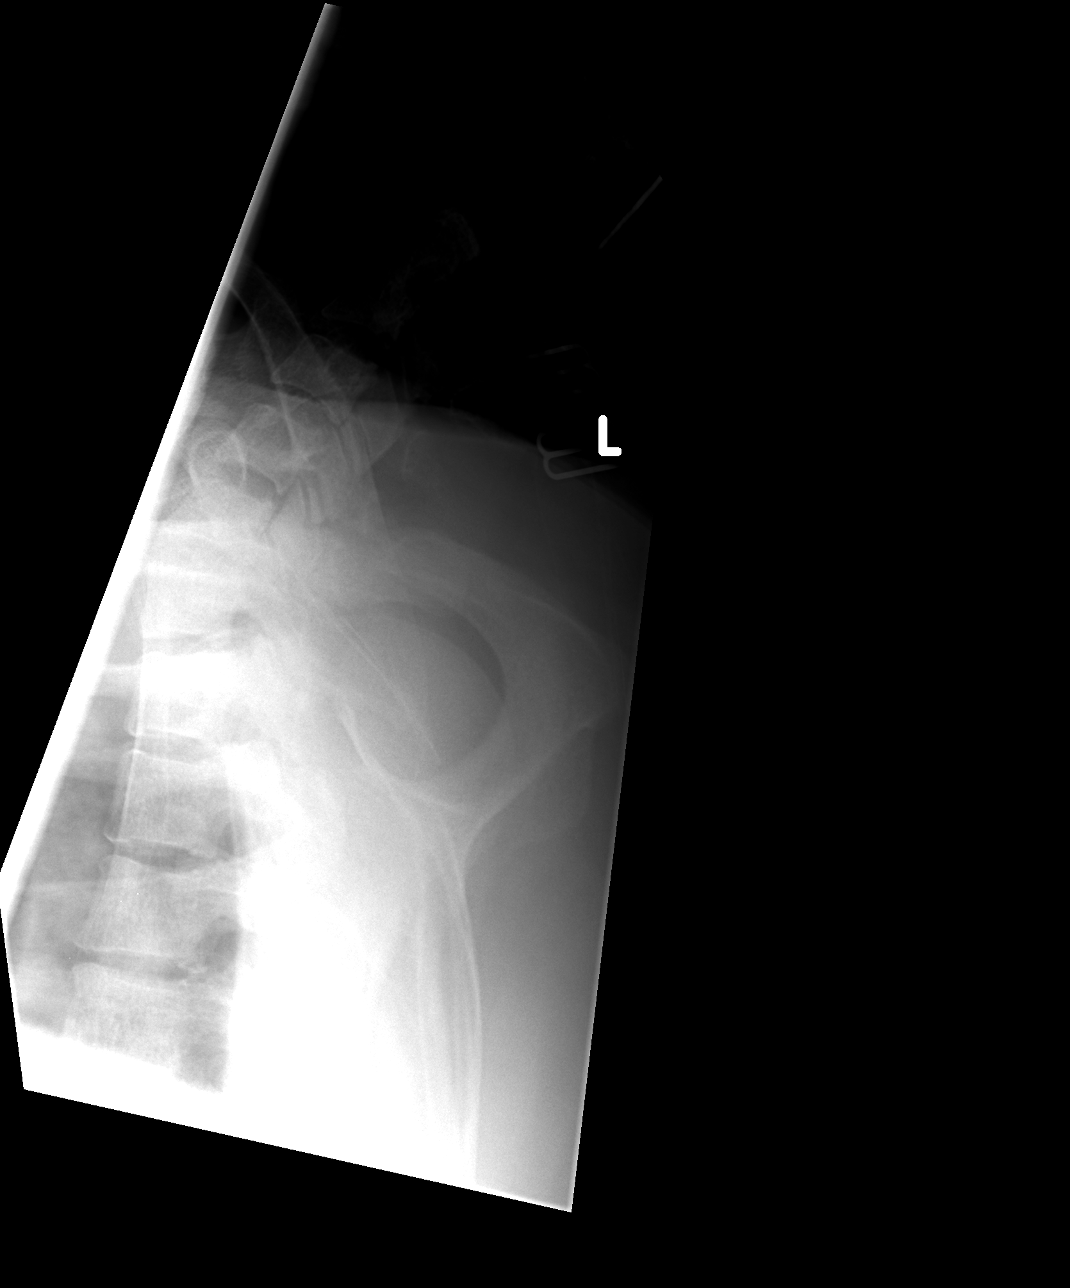

[4 of 4 positions shown; findings below may reference images not displayed]

FINDINGS: Lateral film demonstrates normal alignment.  Disk spaces and vertebral bodies are maintained.  Mild degenerative changes in the lower thoracic spine with osteophytic spurring.  No abnormal paraspinal soft tissue swelling.  Mild scoliotic curvature.
IMPRESSION: 1.  No acute bony findings and normal alignment.
 2.  Mild degenerative changes in the lower thoracic spine.

## 2008-05-30 IMAGING — CT CT HEAD W/O CM
4 of 7 series · 16 of 47 positions shown, 18 images · non-contrast
Comparison: 05/19/2006

CLINICAL DATA: The patient fell out of a scooter at a retail store and struck
her head. She complains of headache and midback pain.

HEAD CT WITHOUT CONTRAST
TECHNIQUE: 5mm collimated images were obtained from the skull base through the
vertex following the standard protocol without intravenous contrast.
TECHNIQUE: Multidetector CT imaging of the cervical spine was performed. 
Sagittal and coronal plane reformatted images were reconstructed from the axial
CT data, and were also reviewed.

[Series 3: head trauma 4.8 h47s · axial · 0.46mm/px · z∈[+1290,+1410]mm · 5 of 36 slices shown, 7 images]
[im 6/36  brain]
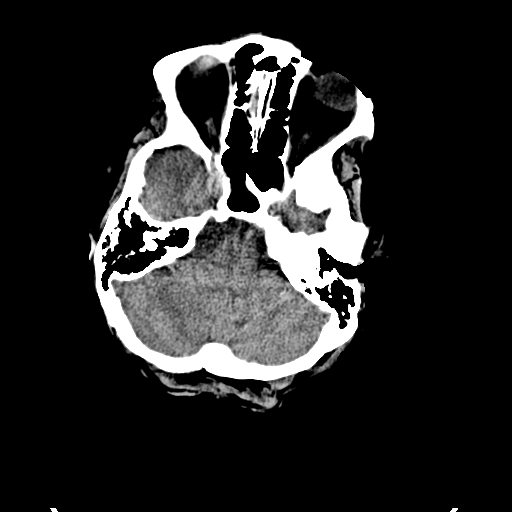
[im 6/36  bone]
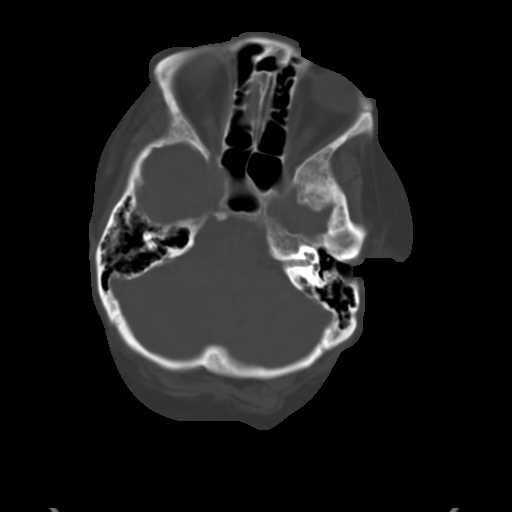
[im 12/36  brain]
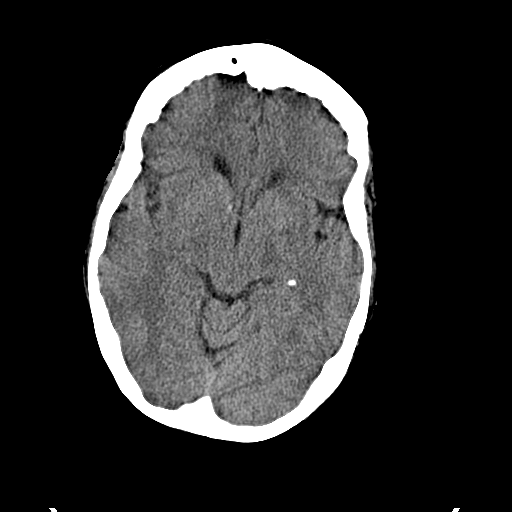
[im 18/36  brain]
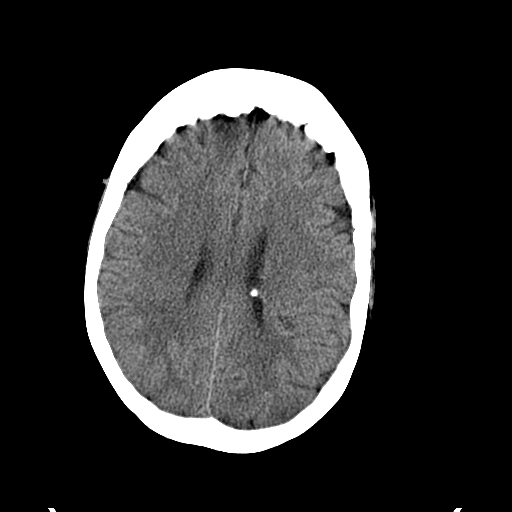
[im 24/36  brain]
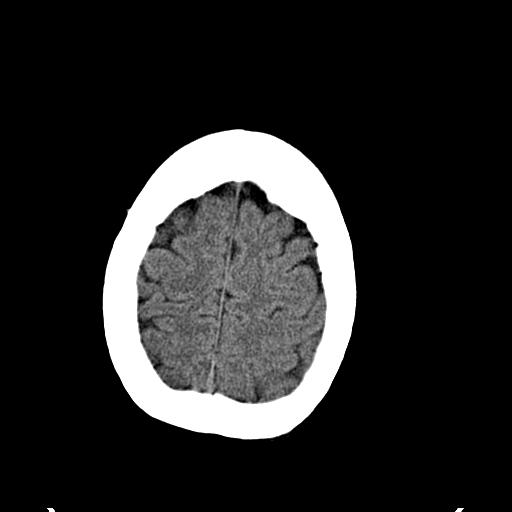
[im 30/36  brain]
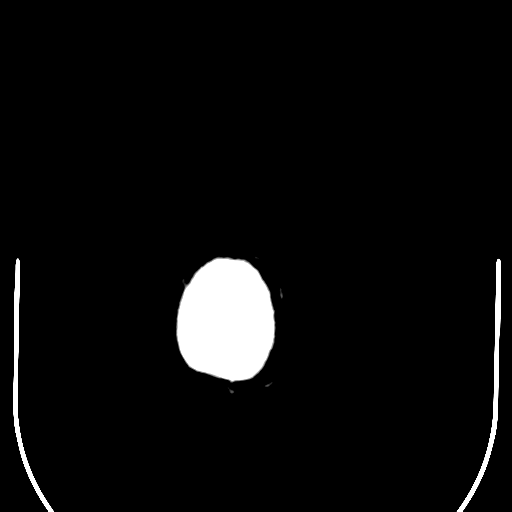
[im 30/36  bone]
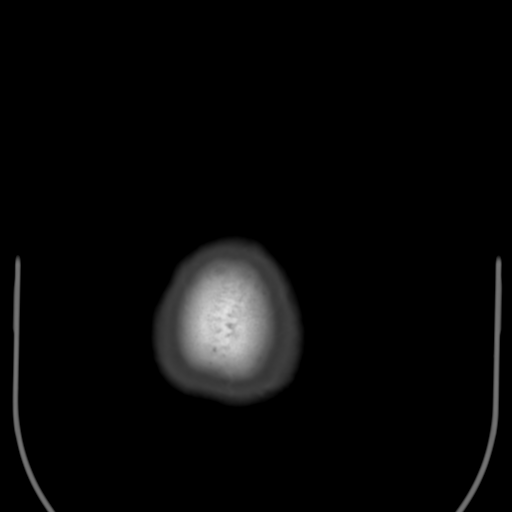

[Series 7: c_spine 2.0 b31s detail · axial · 0.33mm/px · z∈[+1122,+1228]mm · 6 of 89 slices shown]
[im 6/89  bone]
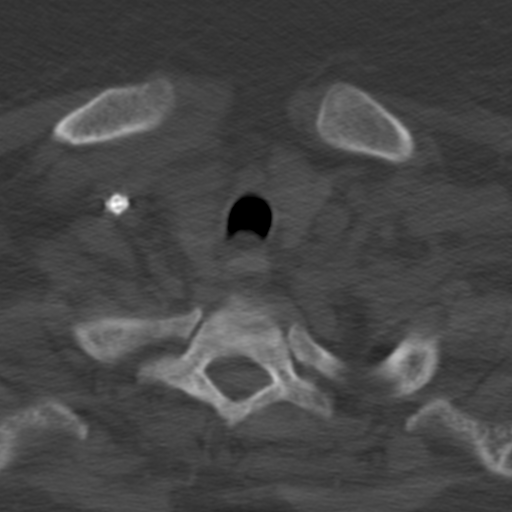
[im 18/89  bone]
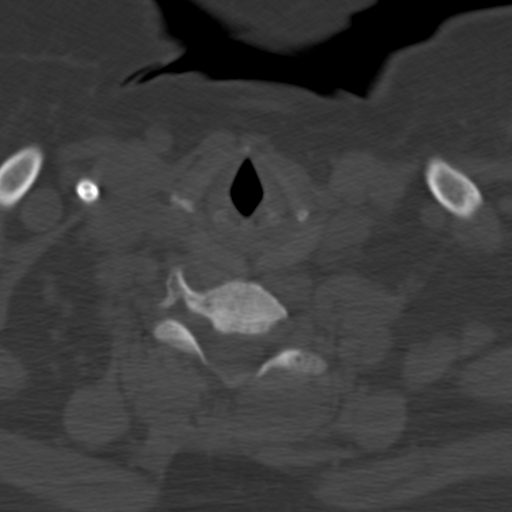
[im 30/89  bone]
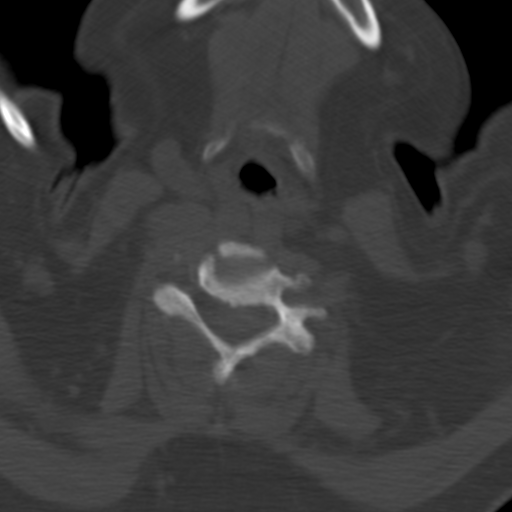
[im 42/89  bone]
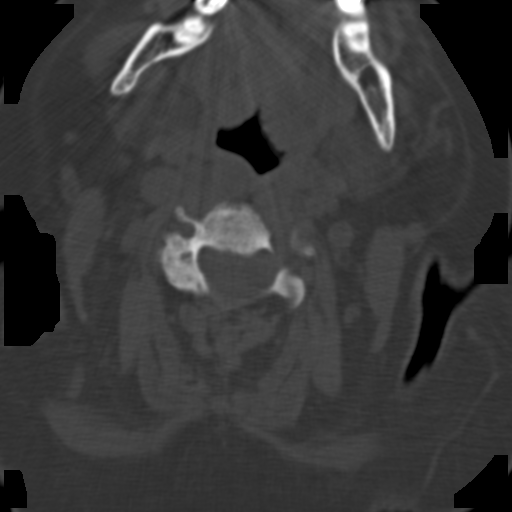
[im 47/89  bone]
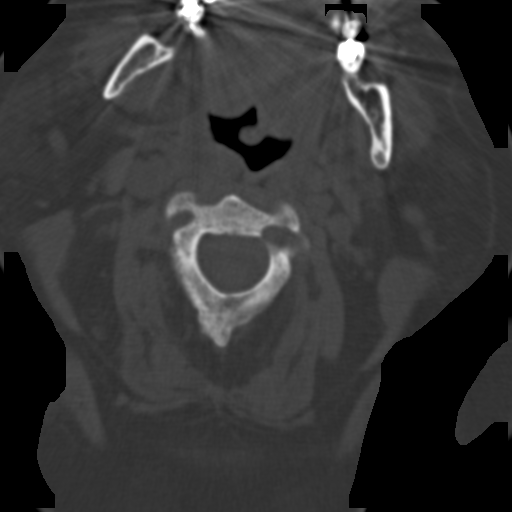
[im 59/89  bone]
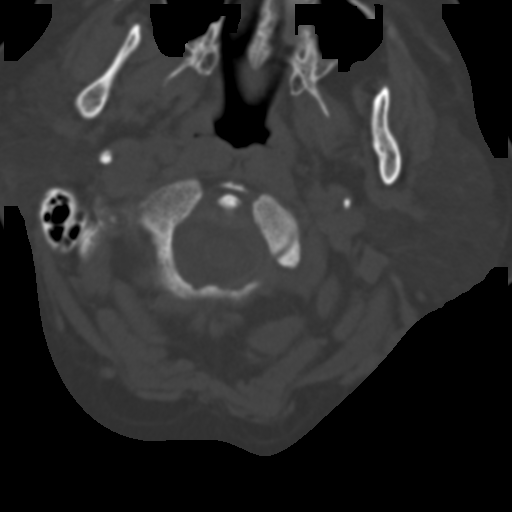

[Series 602: coronal · coronal · 0.35mm/px · 3 of 42 slices shown]
[im 9/42  brain]
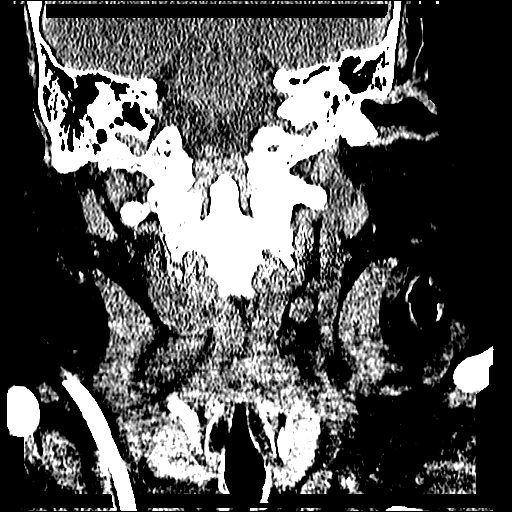
[im 17/42  brain]
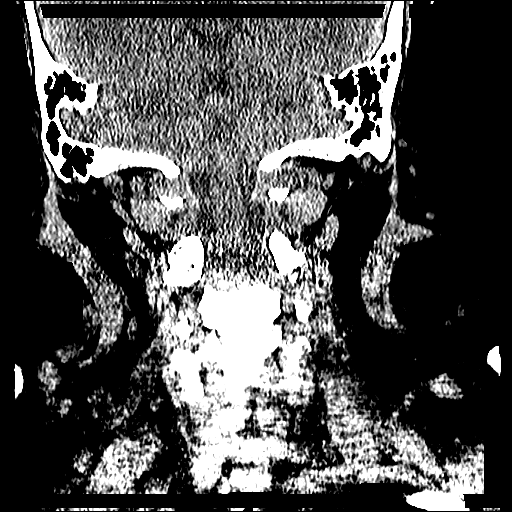
[im 25/42  brain]
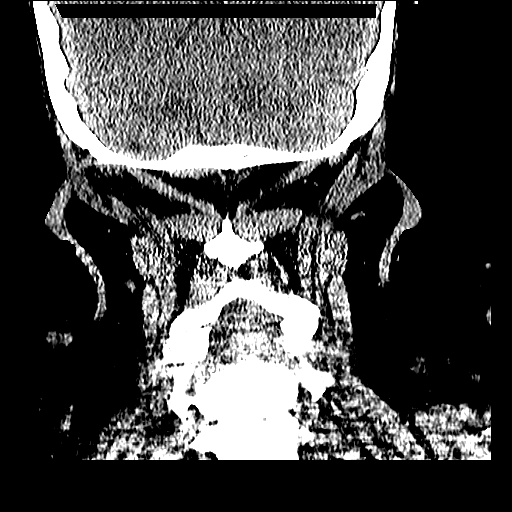

[Series 603: sagittal · sagittal · 0.35mm/px · 2 of 43 slices shown]
[im 15/43  brain]
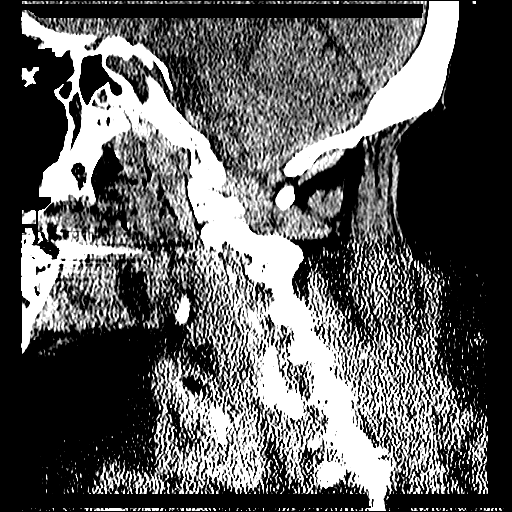
[im 29/43  brain]
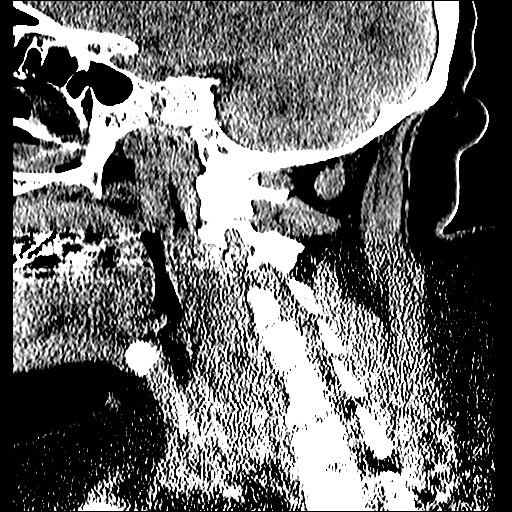

[16 of 47 positions shown; findings below may reference images not displayed]

FINDINGS: Stable subependymal calcifications are present and can be seen in
tuberous sclerosis or prior TORCH infection.

No mass lesion, intracranial hemorrhage, or acute CVA identified. No
hydrocephalus or abnormal extra-axial fluid collection noted. There is mild
hyperostosis frontalis interna.

IMPRESSION

Stable CT appearance of the head, without acute findings. Stable subependymal
calcifications could be due to tuberous sclerosis or prior TORCH infection.

CERVICAL SPINE CT WITHOUT CONTRAST
FINDINGS: Right internal jugular line noted. There is congenital fusion of the
C2 and C3 vertebral levels. Facet overgrowth is present on the right at the
C7-T1 level no osseous foraminal narrowing detected.

No visible fracture or acute subluxation is identified. There is prominence of
the prevertebral soft tissues. This is likely incidental in this case. There is
2 mm of anterior subluxation of C3 on C4 which is likely degenerative in nature.
This could be further investigated with flexion and extension views or cervical
spine MRI as clinically warranted.

IMPRESSION

1. Congenital fusion of C2 and C3.
2. 2 mm of grade 1 anterior subluxation of C3 on C4 is likely degenerative. The
possibility of ligamentous injury cannot be totally excluded. This could be
further investigated with MRI or flexion and extension views, as clinically
warranted.
3. Degenerative facet arthropathy at C7-T1.
4. Prominence of prevertebral soft tissues is present, but has also been visible
on prior remote evaluations for CT of the head, and thus is likely this
patient's baseline.

## 2008-05-31 ENCOUNTER — Ambulatory Visit: Payer: Self-pay | Admitting: Surgery

## 2008-06-01 ENCOUNTER — Telehealth: Payer: Self-pay | Admitting: Internal Medicine

## 2008-06-02 ENCOUNTER — Telehealth: Payer: Self-pay | Admitting: Internal Medicine

## 2008-06-21 ENCOUNTER — Ambulatory Visit: Payer: Self-pay | Admitting: Licensed Clinical Social Worker

## 2008-06-21 ENCOUNTER — Ambulatory Visit: Payer: Self-pay | Admitting: Internal Medicine

## 2008-06-30 ENCOUNTER — Ambulatory Visit: Payer: Self-pay | Admitting: Vascular Surgery

## 2008-06-30 ENCOUNTER — Ambulatory Visit: Payer: Self-pay | Admitting: Internal Medicine

## 2008-06-30 ENCOUNTER — Inpatient Hospital Stay (HOSPITAL_COMMUNITY): Admission: EM | Admit: 2008-06-30 | Discharge: 2008-07-04 | Payer: Self-pay | Admitting: Emergency Medicine

## 2008-07-05 ENCOUNTER — Emergency Department (HOSPITAL_COMMUNITY): Admission: EM | Admit: 2008-07-05 | Discharge: 2008-07-05 | Payer: Self-pay | Admitting: Emergency Medicine

## 2008-07-06 ENCOUNTER — Ambulatory Visit (HOSPITAL_COMMUNITY): Admission: RE | Admit: 2008-07-06 | Discharge: 2008-07-06 | Payer: Self-pay | Admitting: Vascular Surgery

## 2008-07-14 ENCOUNTER — Telehealth: Payer: Self-pay | Admitting: Internal Medicine

## 2008-07-21 ENCOUNTER — Telehealth: Payer: Self-pay | Admitting: Internal Medicine

## 2008-07-28 ENCOUNTER — Telehealth (INDEPENDENT_AMBULATORY_CARE_PROVIDER_SITE_OTHER): Payer: Self-pay | Admitting: *Deleted

## 2008-08-05 ENCOUNTER — Encounter: Payer: Self-pay | Admitting: Internal Medicine

## 2008-08-06 IMAGING — CT CT HEAD W/O CM
1 series · 16 of 30 positions shown, 20 images · IV contrast (agent unspecified)
Comparison: 05/30/06.

CLINICAL DATA: Severe and nausea and vomiting.  Hypertension.  
 HEAD CT WITHOUT CONTRAST:
TECHNIQUE: Contiguous axial images were obtained from the base of the skull through the vertex according to standard protocol without contrast.

[Series 2: head routine 4.8 h47s · axial · 0.45mm/px · z∈[-129,+6]mm · 16 of 30 slices shown, 20 images]
[im 2/30  brain]
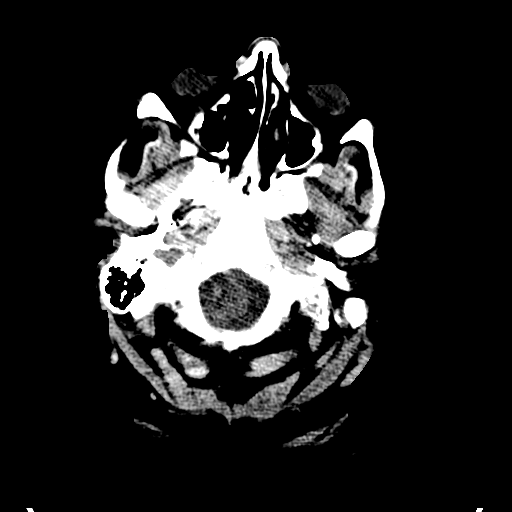
[im 2/30  bone]
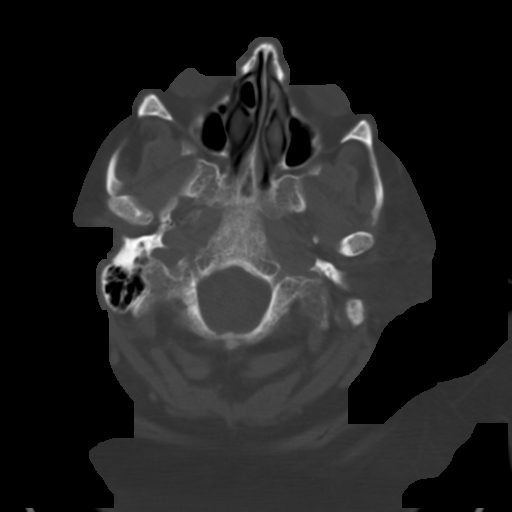
[im 4/30  brain]
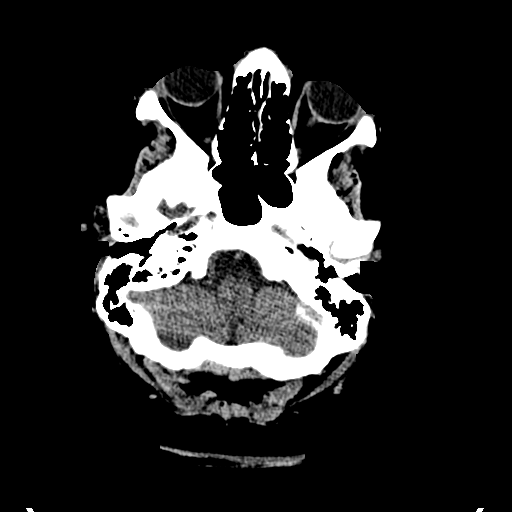
[im 6/30  brain]
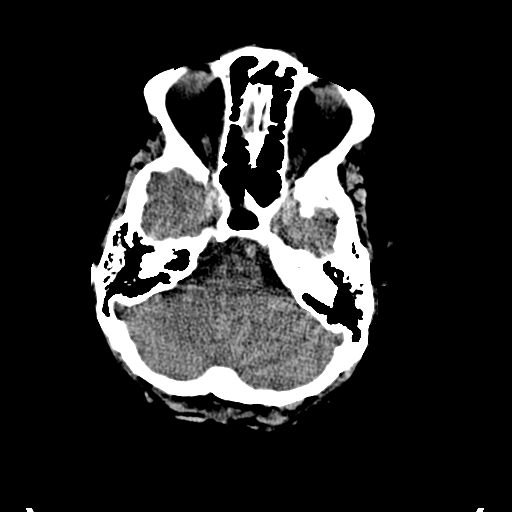
[im 8/30  brain]
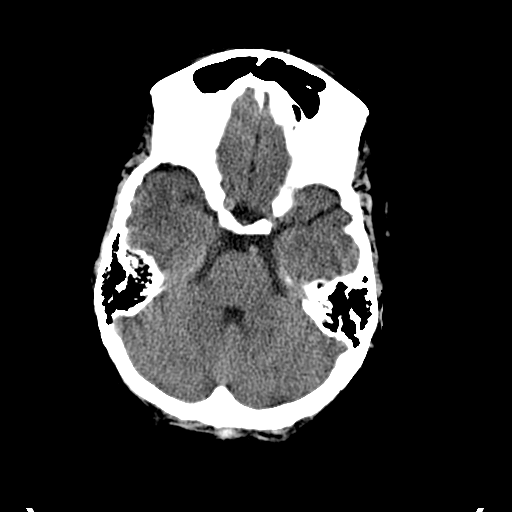
[im 9/30  brain]
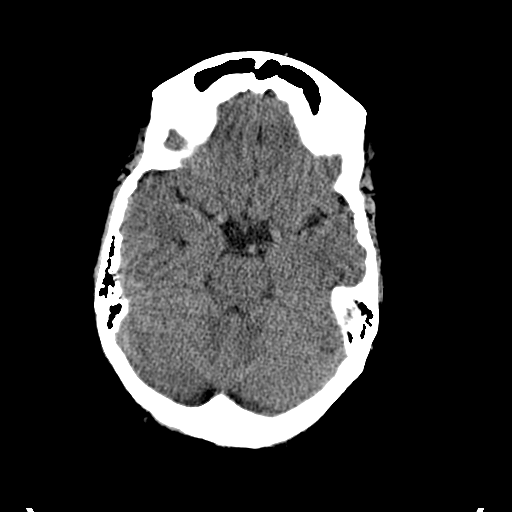
[im 9/30  bone]
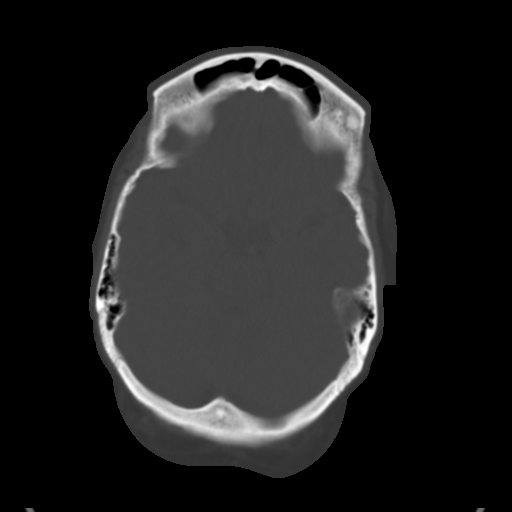
[im 11/30  brain]
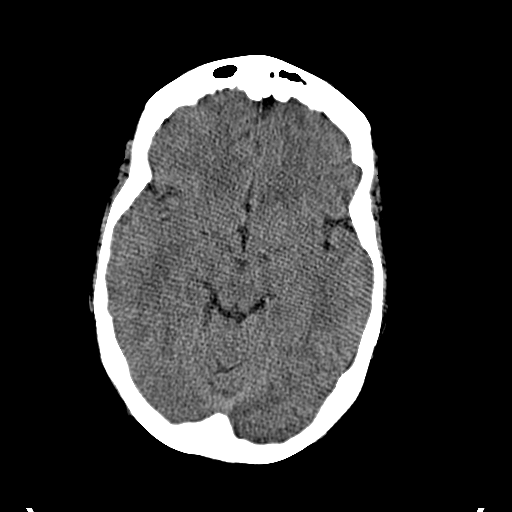
[im 13/30  brain]
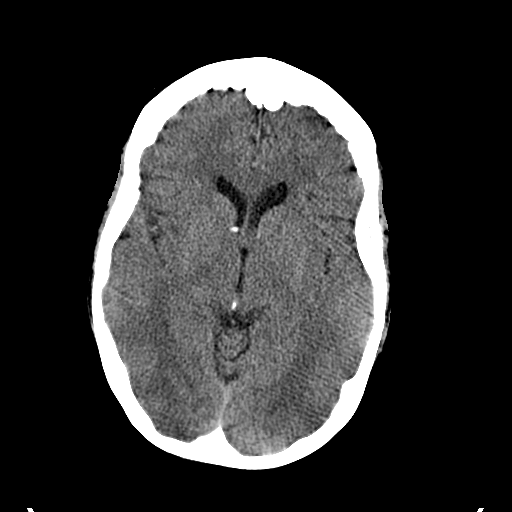
[im 15/30  brain]
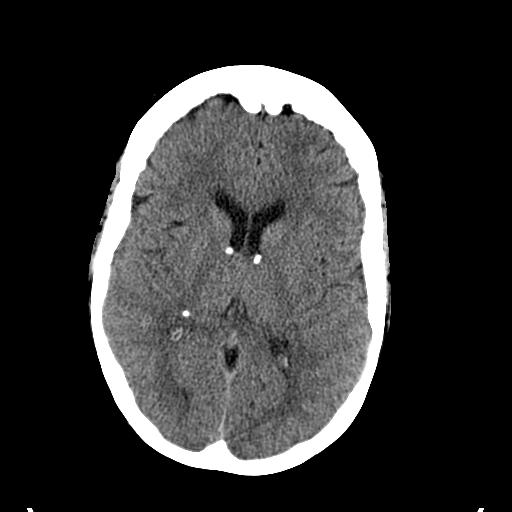
[im 16/30  brain]
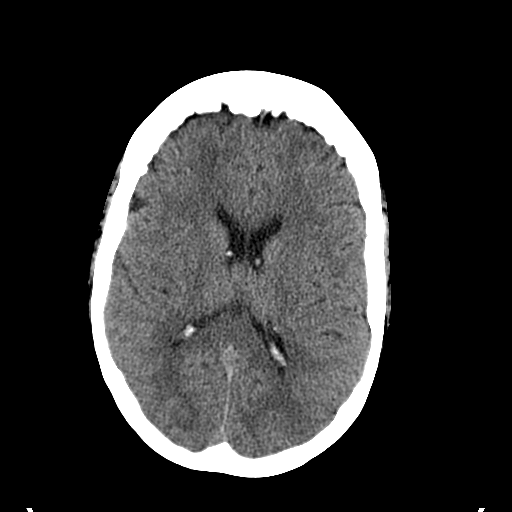
[im 16/30  bone]
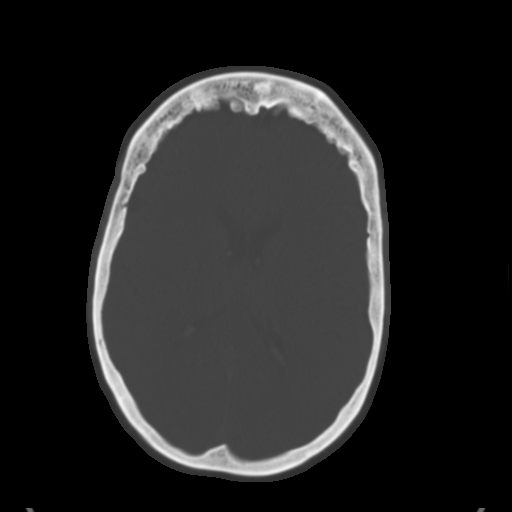
[im 18/30  brain]
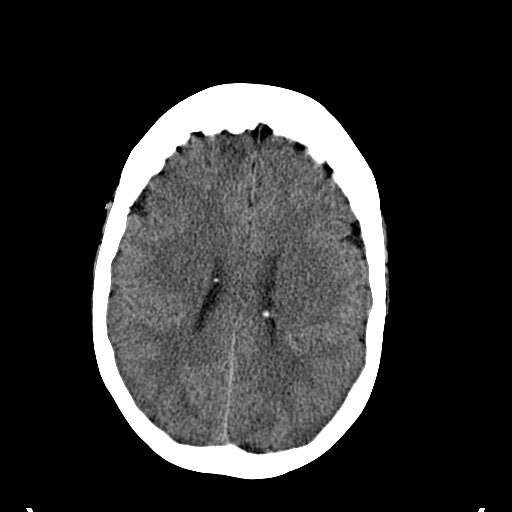
[im 20/30  brain]
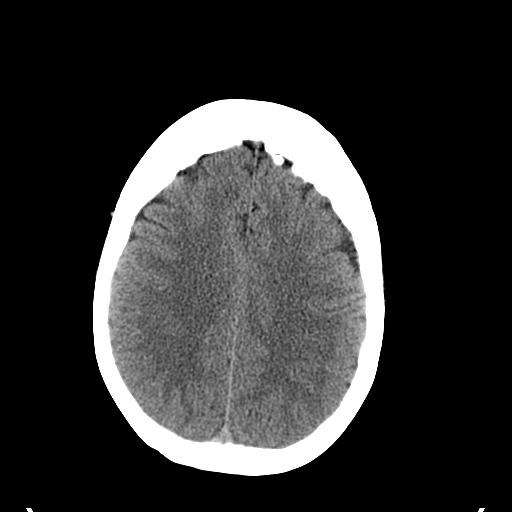
[im 22/30  brain]
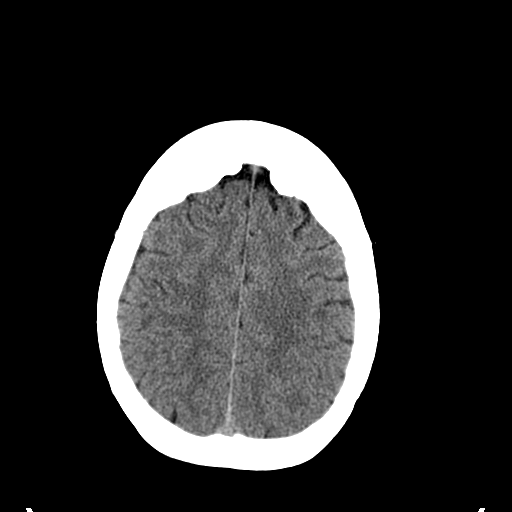
[im 23/30  brain]
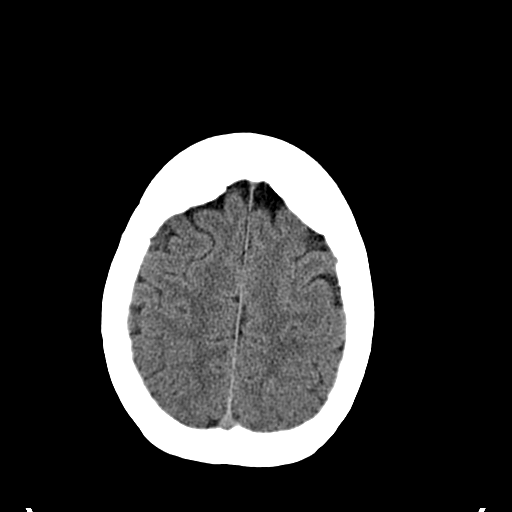
[im 23/30  bone]
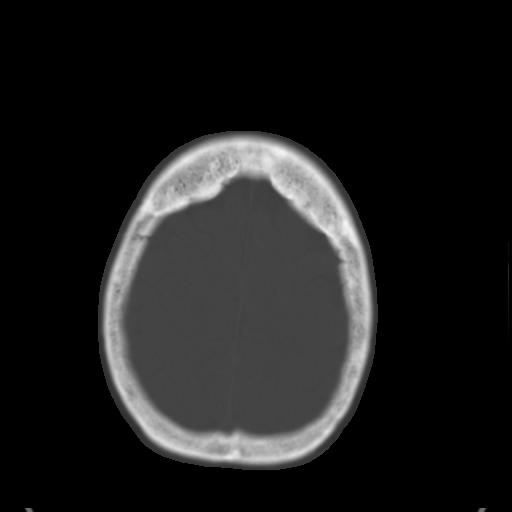
[im 25/30  brain]
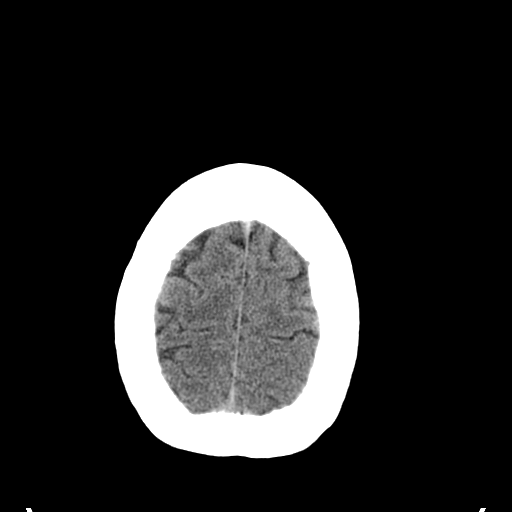
[im 27/30  brain]
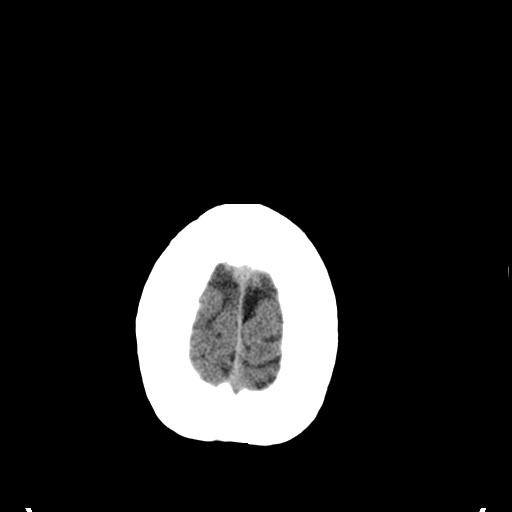
[im 29/30  brain]
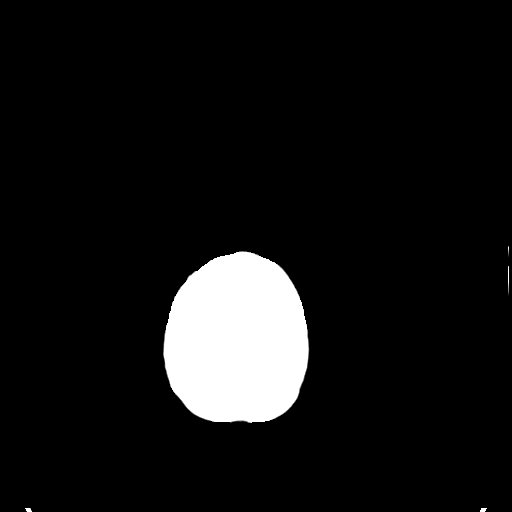

[16 of 30 positions shown; findings below may reference images not displayed]

FINDINGS: There is no evidence of intracranial hemorrhage, brain edema, acute infarct, mass lesion, or mass effect.  No other intra-axial abnormalities are seen, and the ventricles are within normal limits.  No abnormal extra-axial fluid collections or masses are identified.  No skull abnormalities are noted.
IMPRESSION: Stable head CT.  No acute intracranial findings.

## 2008-08-08 ENCOUNTER — Ambulatory Visit: Payer: Self-pay | Admitting: Vascular Surgery

## 2008-08-08 ENCOUNTER — Ambulatory Visit (HOSPITAL_COMMUNITY): Admission: EM | Admit: 2008-08-08 | Discharge: 2008-08-08 | Payer: Self-pay | Admitting: Emergency Medicine

## 2008-08-11 ENCOUNTER — Telehealth: Payer: Self-pay | Admitting: Internal Medicine

## 2008-08-12 ENCOUNTER — Encounter: Payer: Self-pay | Admitting: Internal Medicine

## 2008-08-13 ENCOUNTER — Ambulatory Visit: Payer: Self-pay | Admitting: Vascular Surgery

## 2008-08-16 ENCOUNTER — Ambulatory Visit: Payer: Self-pay | Admitting: Internal Medicine

## 2008-08-19 ENCOUNTER — Telehealth: Payer: Self-pay | Admitting: *Deleted

## 2008-08-19 ENCOUNTER — Encounter: Payer: Self-pay | Admitting: Internal Medicine

## 2008-08-23 ENCOUNTER — Telehealth: Payer: Self-pay | Admitting: Internal Medicine

## 2008-09-03 ENCOUNTER — Encounter: Payer: Self-pay | Admitting: Internal Medicine

## 2008-09-03 ENCOUNTER — Telehealth (INDEPENDENT_AMBULATORY_CARE_PROVIDER_SITE_OTHER): Payer: Self-pay | Admitting: *Deleted

## 2008-09-04 IMAGING — XA IR VENTRICULOPERITONEALSHUNT EVAL/INJ
1 series · 14 of 24 positions shown · non-contrast
Comparison: Fistulogram dated [DATE].

CLINICAL DATA: Prolonged bleeding following dialysis.  
LEFT UPPER EXTREMITY FISTULOGRAM
VENOUS PTA ? 09/04/06:

[Series 1000: run · 0.21mm/px · 14 of 54 slices shown]
[im 1/54]
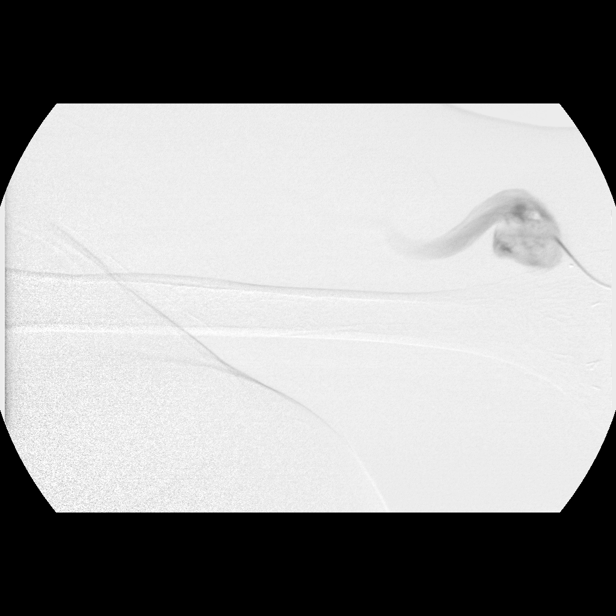
[im 5/54]
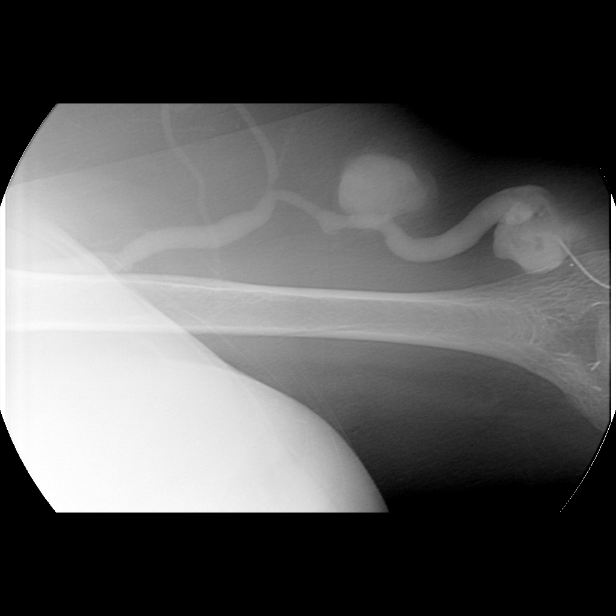
[im 10/54]
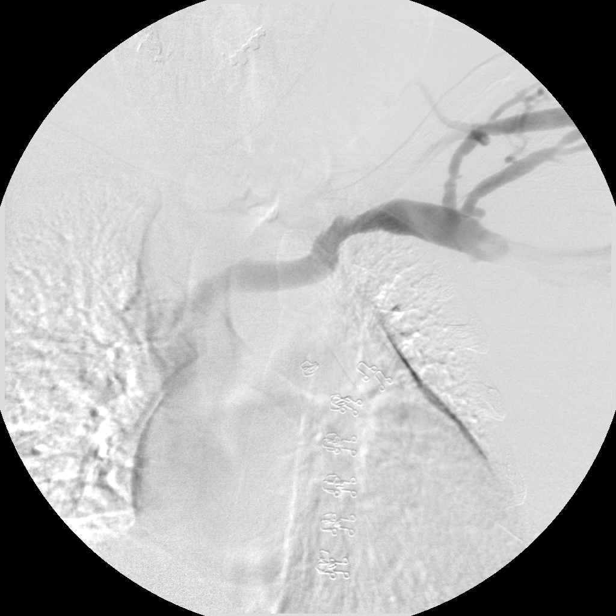
[im 14/54]
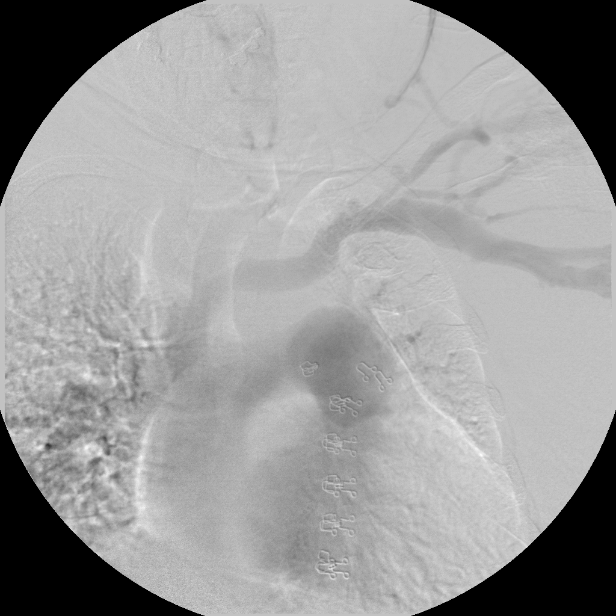
[im 17/54]
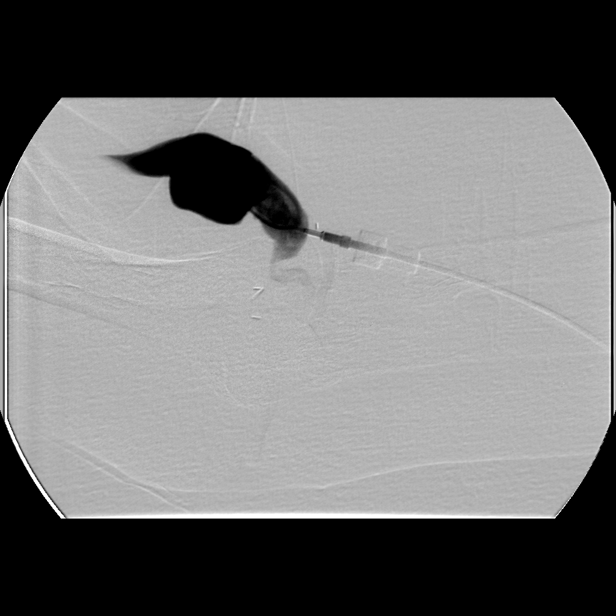
[im 21/54]
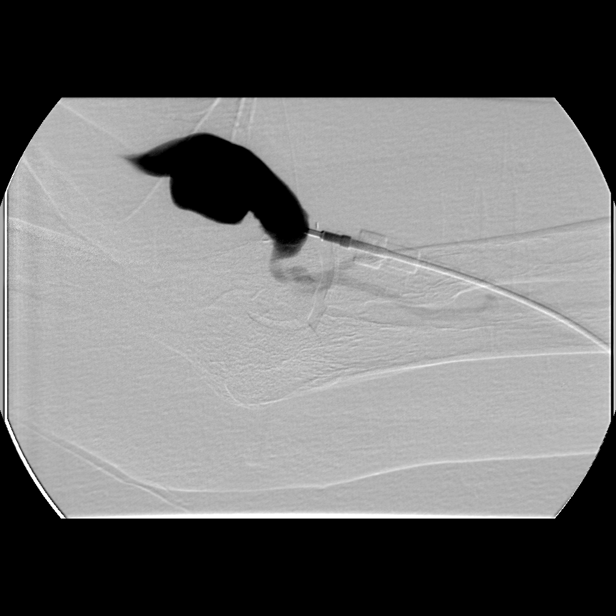
[im 26/54]
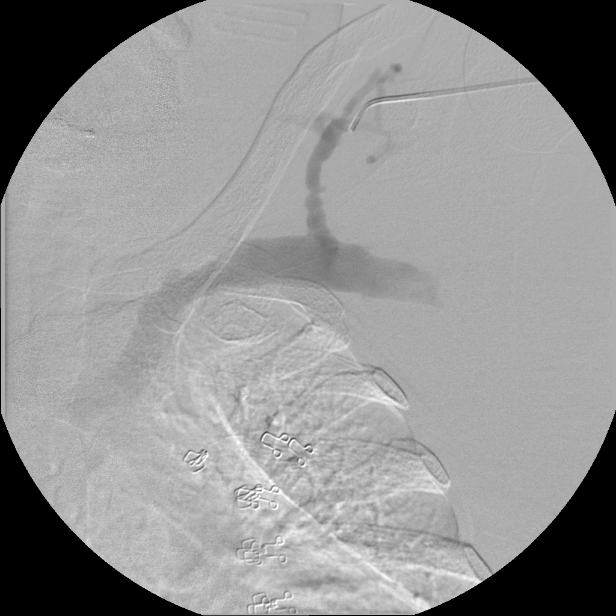
[im 28/54]
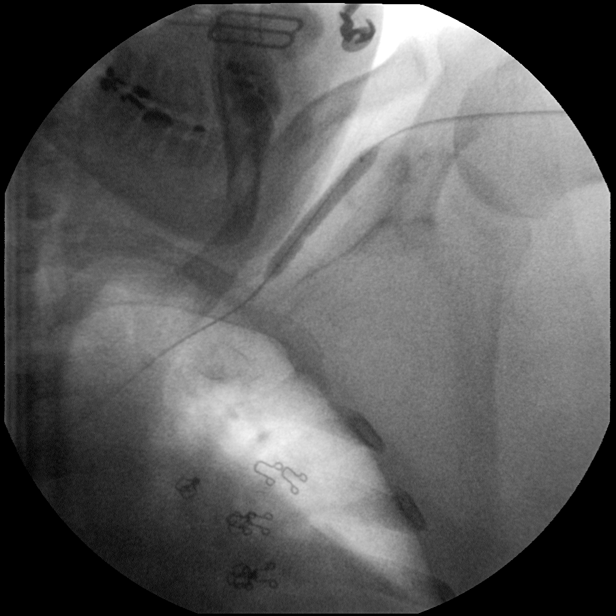
[im 33/54]
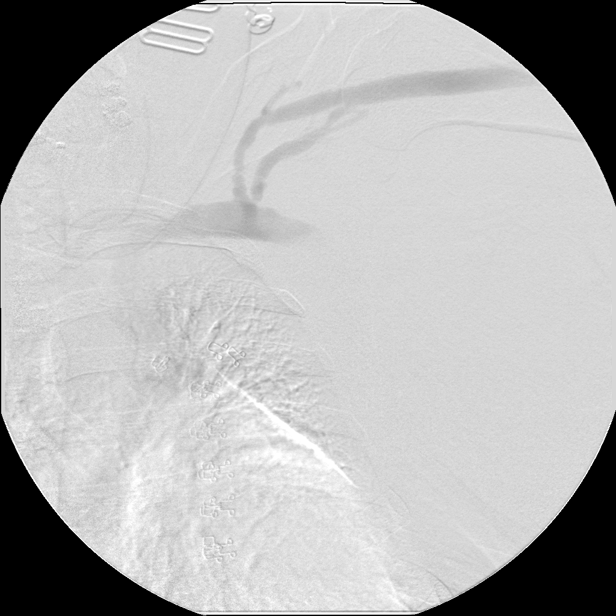
[im 37/54]
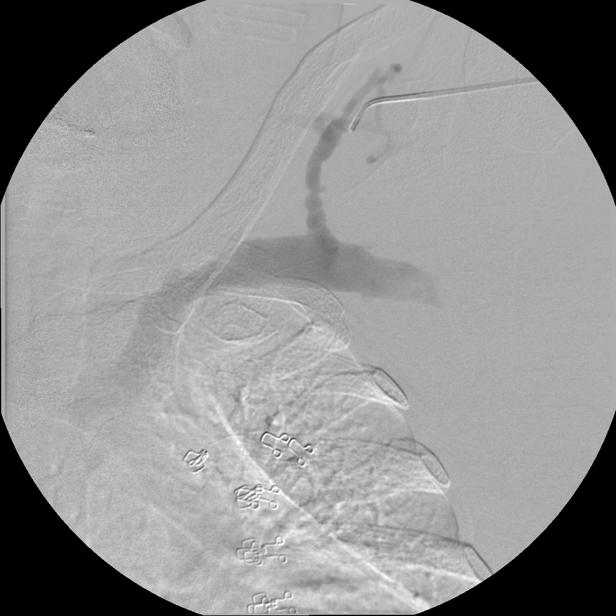
[im 42/54]
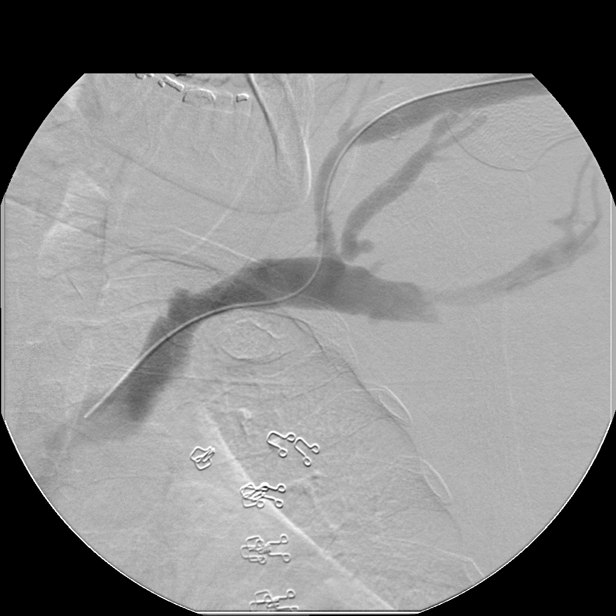
[im 44/54]
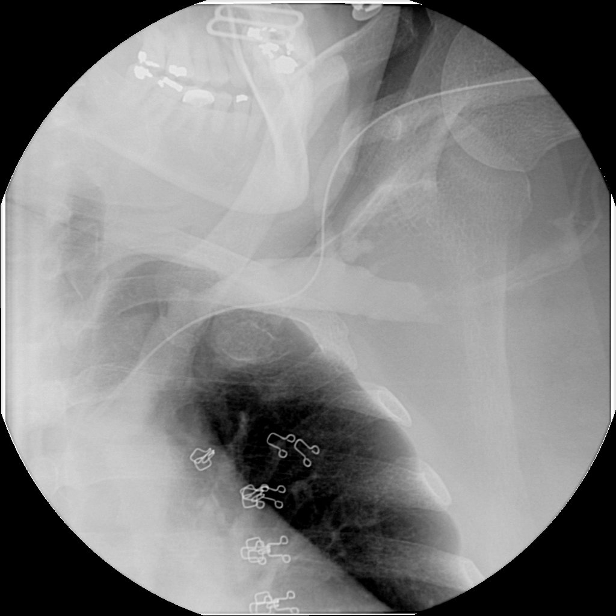
[im 49/54]
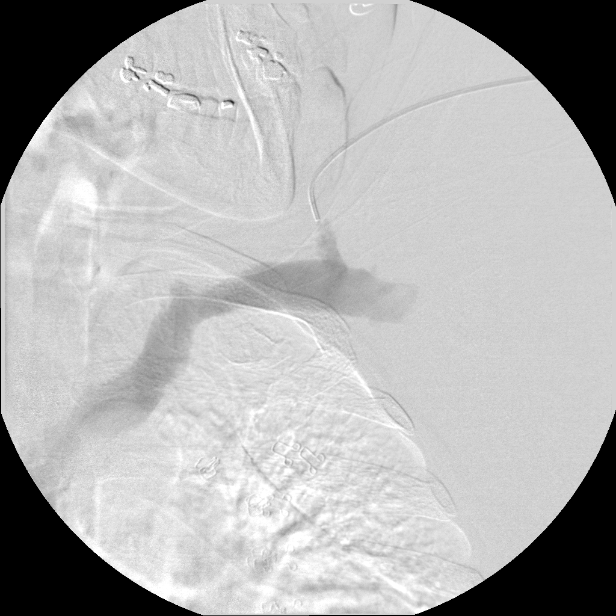
[im 54/54]
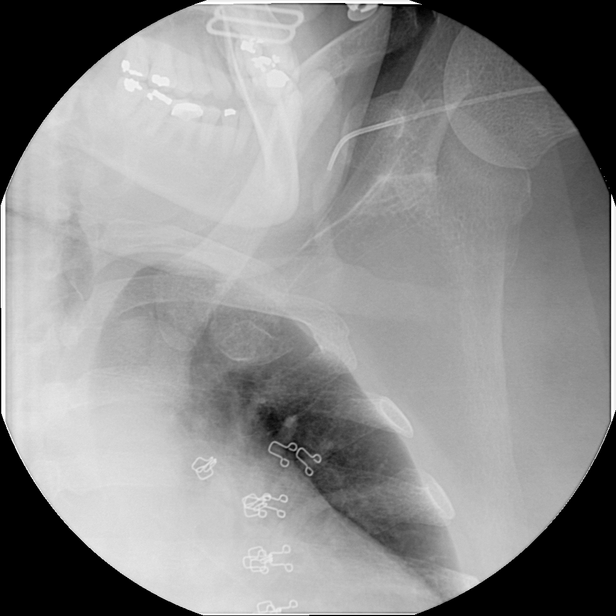

[14 of 24 positions shown; findings below may reference images not displayed]

Physician:  Dr. Mahaniharun Elyana.
Procedure:  Informed consent was obtained.   The fistula was accessed beyond the arterial anastomosis and just below a pseudoaneurysm formation.  A series of fistulogram images were obtained.  The fistula was widely patent.  Since the prior examination, there has been development of two large pseudoaneurysms in the lower left arm.  The cephalic vein was tortuous but there was not a significant stenosis in the arm region.   However, the central cephalic vein demonstrated an area of irregularity and this finding was suspicious for a stenosis.  There was also increased collateral formation near the central cephalic vein compared to the prior examination.  The left subclavian vein, innominate vein and SVC were patent.  Anastomosis was patent.  
The 18-gauge Angiocath was exchanged for a 6-French vascular sheath.  A Kumpe catheter was advanced into the central cephalic vein and additional angiograms again demonstrated a stenosis near the left axillary vein.  A Bentson wire was advanced centrally and the central cephalic vein was angioplastied with a 6 mm x 4 mm Dorado balloon.  There was a small residual waist with balloon dilatation.   The follow-up angiogram demonstrated improveed flow.   Sheath removed with manual compression.
IMPRESSION: 1.  Stenosis involving the central cephalic vein that improved following angioplasty with a 6 mm balloon.  
2.  Development of pseudoaneurysm formations in the lower arm.

## 2008-09-11 ENCOUNTER — Inpatient Hospital Stay (HOSPITAL_COMMUNITY): Admission: EM | Admit: 2008-09-11 | Discharge: 2008-09-12 | Payer: Self-pay | Admitting: Emergency Medicine

## 2008-09-21 ENCOUNTER — Telehealth: Payer: Self-pay | Admitting: Internal Medicine

## 2008-09-30 ENCOUNTER — Encounter: Payer: Self-pay | Admitting: Internal Medicine

## 2008-10-04 ENCOUNTER — Telehealth: Payer: Self-pay | Admitting: Internal Medicine

## 2008-10-05 ENCOUNTER — Ambulatory Visit: Payer: Self-pay | Admitting: Internal Medicine

## 2008-10-06 ENCOUNTER — Ambulatory Visit (HOSPITAL_COMMUNITY): Admission: RE | Admit: 2008-10-06 | Discharge: 2008-10-06 | Payer: Self-pay | Admitting: Nephrology

## 2008-10-14 ENCOUNTER — Ambulatory Visit (HOSPITAL_COMMUNITY): Admission: RE | Admit: 2008-10-14 | Discharge: 2008-10-14 | Payer: Self-pay | Admitting: Nephrology

## 2008-10-15 ENCOUNTER — Emergency Department (HOSPITAL_COMMUNITY): Admission: EM | Admit: 2008-10-15 | Discharge: 2008-10-15 | Payer: Self-pay | Admitting: Emergency Medicine

## 2008-10-18 ENCOUNTER — Telehealth: Payer: Self-pay | Admitting: Internal Medicine

## 2008-10-20 ENCOUNTER — Telehealth: Payer: Self-pay | Admitting: *Deleted

## 2008-10-21 ENCOUNTER — Ambulatory Visit (HOSPITAL_COMMUNITY): Admission: RE | Admit: 2008-10-21 | Discharge: 2008-10-21 | Payer: Self-pay | Admitting: Nephrology

## 2008-10-25 ENCOUNTER — Emergency Department (HOSPITAL_COMMUNITY): Admission: EM | Admit: 2008-10-25 | Discharge: 2008-10-25 | Payer: Self-pay | Admitting: Emergency Medicine

## 2008-10-28 ENCOUNTER — Telehealth: Payer: Self-pay | Admitting: Internal Medicine

## 2008-11-04 ENCOUNTER — Ambulatory Visit (HOSPITAL_COMMUNITY): Admission: RE | Admit: 2008-11-04 | Discharge: 2008-11-04 | Payer: Self-pay | Admitting: Surgery

## 2008-11-04 ENCOUNTER — Ambulatory Visit: Payer: Self-pay | Admitting: Vascular Surgery

## 2008-11-05 ENCOUNTER — Emergency Department (HOSPITAL_COMMUNITY): Admission: EM | Admit: 2008-11-05 | Discharge: 2008-11-05 | Payer: Self-pay | Admitting: Emergency Medicine

## 2008-11-08 ENCOUNTER — Telehealth: Payer: Self-pay | Admitting: Internal Medicine

## 2008-11-12 ENCOUNTER — Ambulatory Visit (HOSPITAL_COMMUNITY): Admission: RE | Admit: 2008-11-12 | Discharge: 2008-11-12 | Payer: Self-pay | Admitting: Nephrology

## 2008-12-04 ENCOUNTER — Emergency Department (HOSPITAL_COMMUNITY): Admission: EM | Admit: 2008-12-04 | Discharge: 2008-12-04 | Payer: Self-pay | Admitting: Emergency Medicine

## 2008-12-09 ENCOUNTER — Ambulatory Visit (HOSPITAL_COMMUNITY): Admission: RE | Admit: 2008-12-09 | Discharge: 2008-12-09 | Payer: Self-pay | Admitting: Nephrology

## 2008-12-16 ENCOUNTER — Telehealth: Payer: Self-pay | Admitting: Internal Medicine

## 2008-12-16 ENCOUNTER — Ambulatory Visit: Payer: Self-pay | Admitting: Internal Medicine

## 2008-12-16 DIAGNOSIS — K645 Perianal venous thrombosis: Secondary | ICD-10-CM

## 2008-12-16 DIAGNOSIS — G4733 Obstructive sleep apnea (adult) (pediatric): Secondary | ICD-10-CM

## 2008-12-20 ENCOUNTER — Encounter: Payer: Self-pay | Admitting: Internal Medicine

## 2008-12-21 ENCOUNTER — Encounter: Payer: Self-pay | Admitting: Internal Medicine

## 2008-12-27 IMAGING — CR DG CHEST 1V PORT
1 series · 1 of 1 positions shown · non-contrast
Comparison: 04/23/06.

CLINICAL DATA: Diatek catheter placement. End stage renal disease.
 PORTABLE CHEST - 1 VIEW (6200 hours):

[view not recorded]
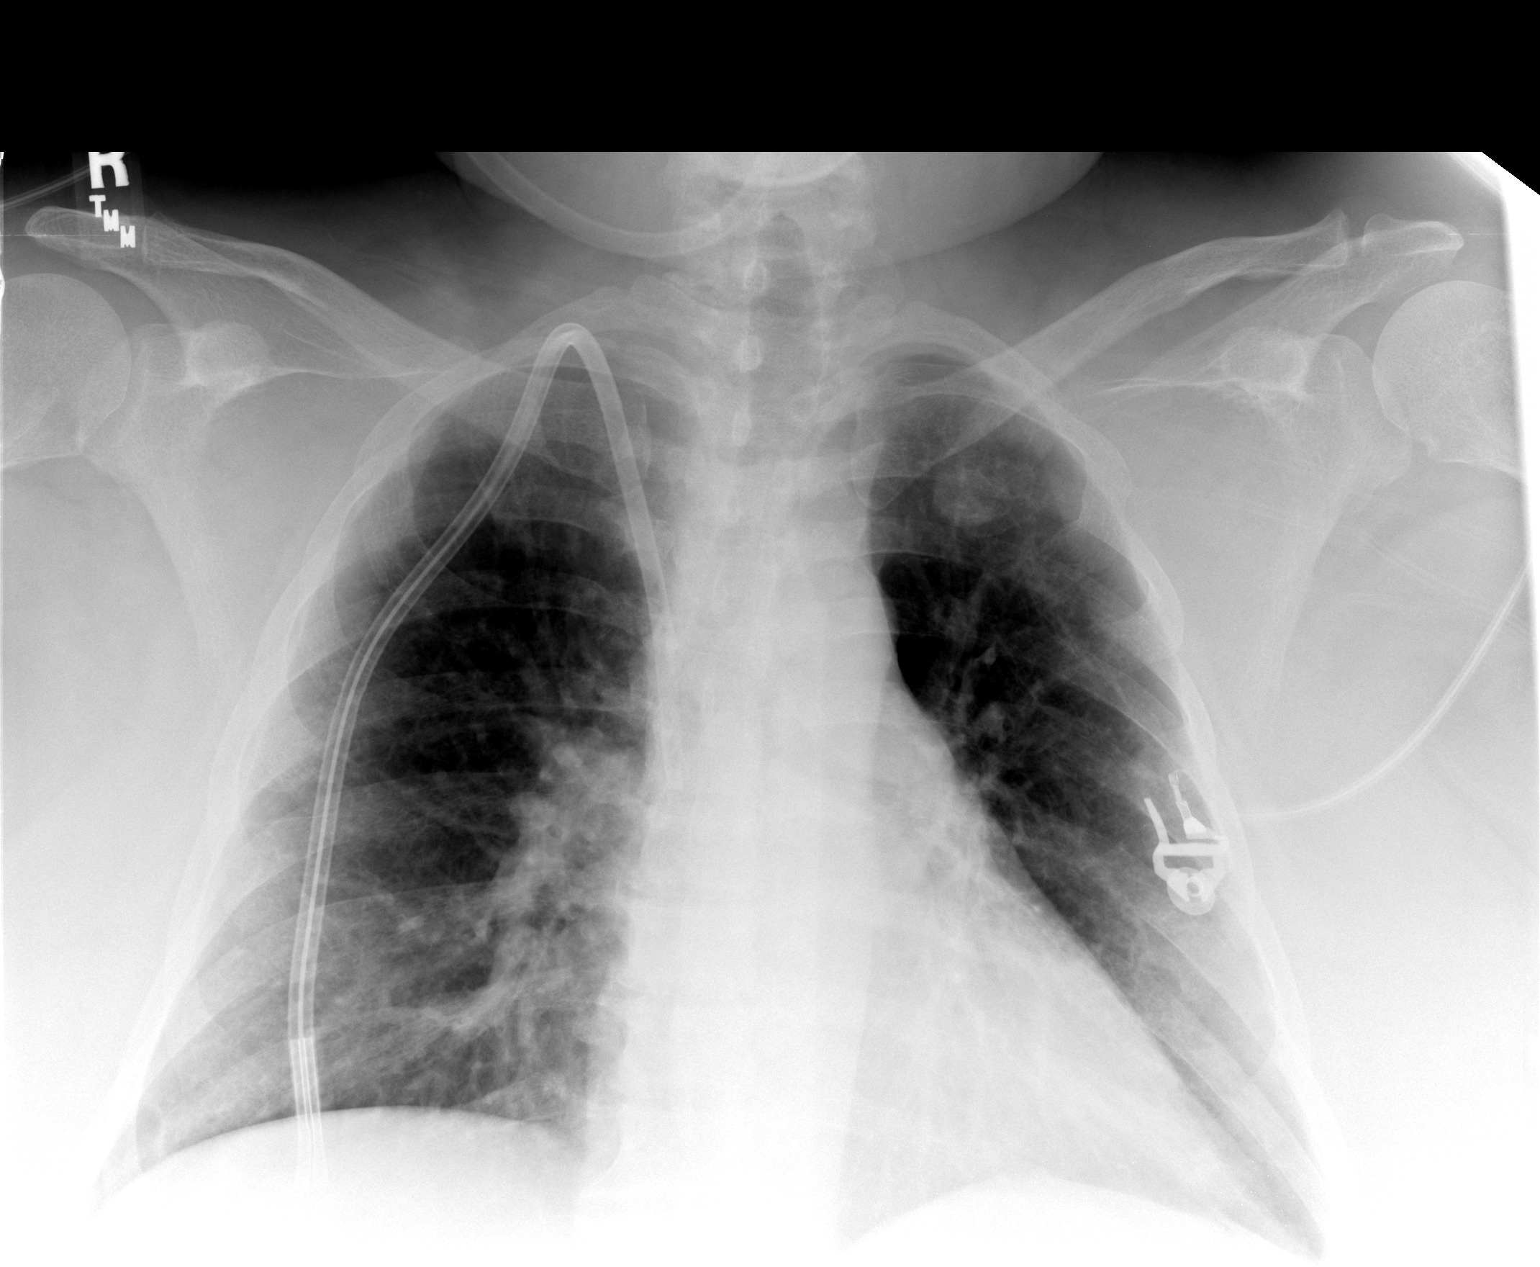

[1 of 1 positions shown; findings below may reference images not displayed]

FINDINGS: Right Diatek catheter tips are noted in the low SVC.  No pneumothorax is seen. The lungs are clear. Heart size is stable.
IMPRESSION: Right Diatek catheter tips in low SVC.  No pneumothorax.

## 2009-01-03 ENCOUNTER — Telehealth: Payer: Self-pay | Admitting: Internal Medicine

## 2009-01-08 IMAGING — CR DG CHEST 1V
1 series · 1 of 1 positions shown · non-contrast
Comparison: 12/27/2006

CLINICAL DATA: Status post central venous line placement.  Now with bleeding at catheter site.  
PORTABLE CHEST - 01/08/2007:

[w chest pa]
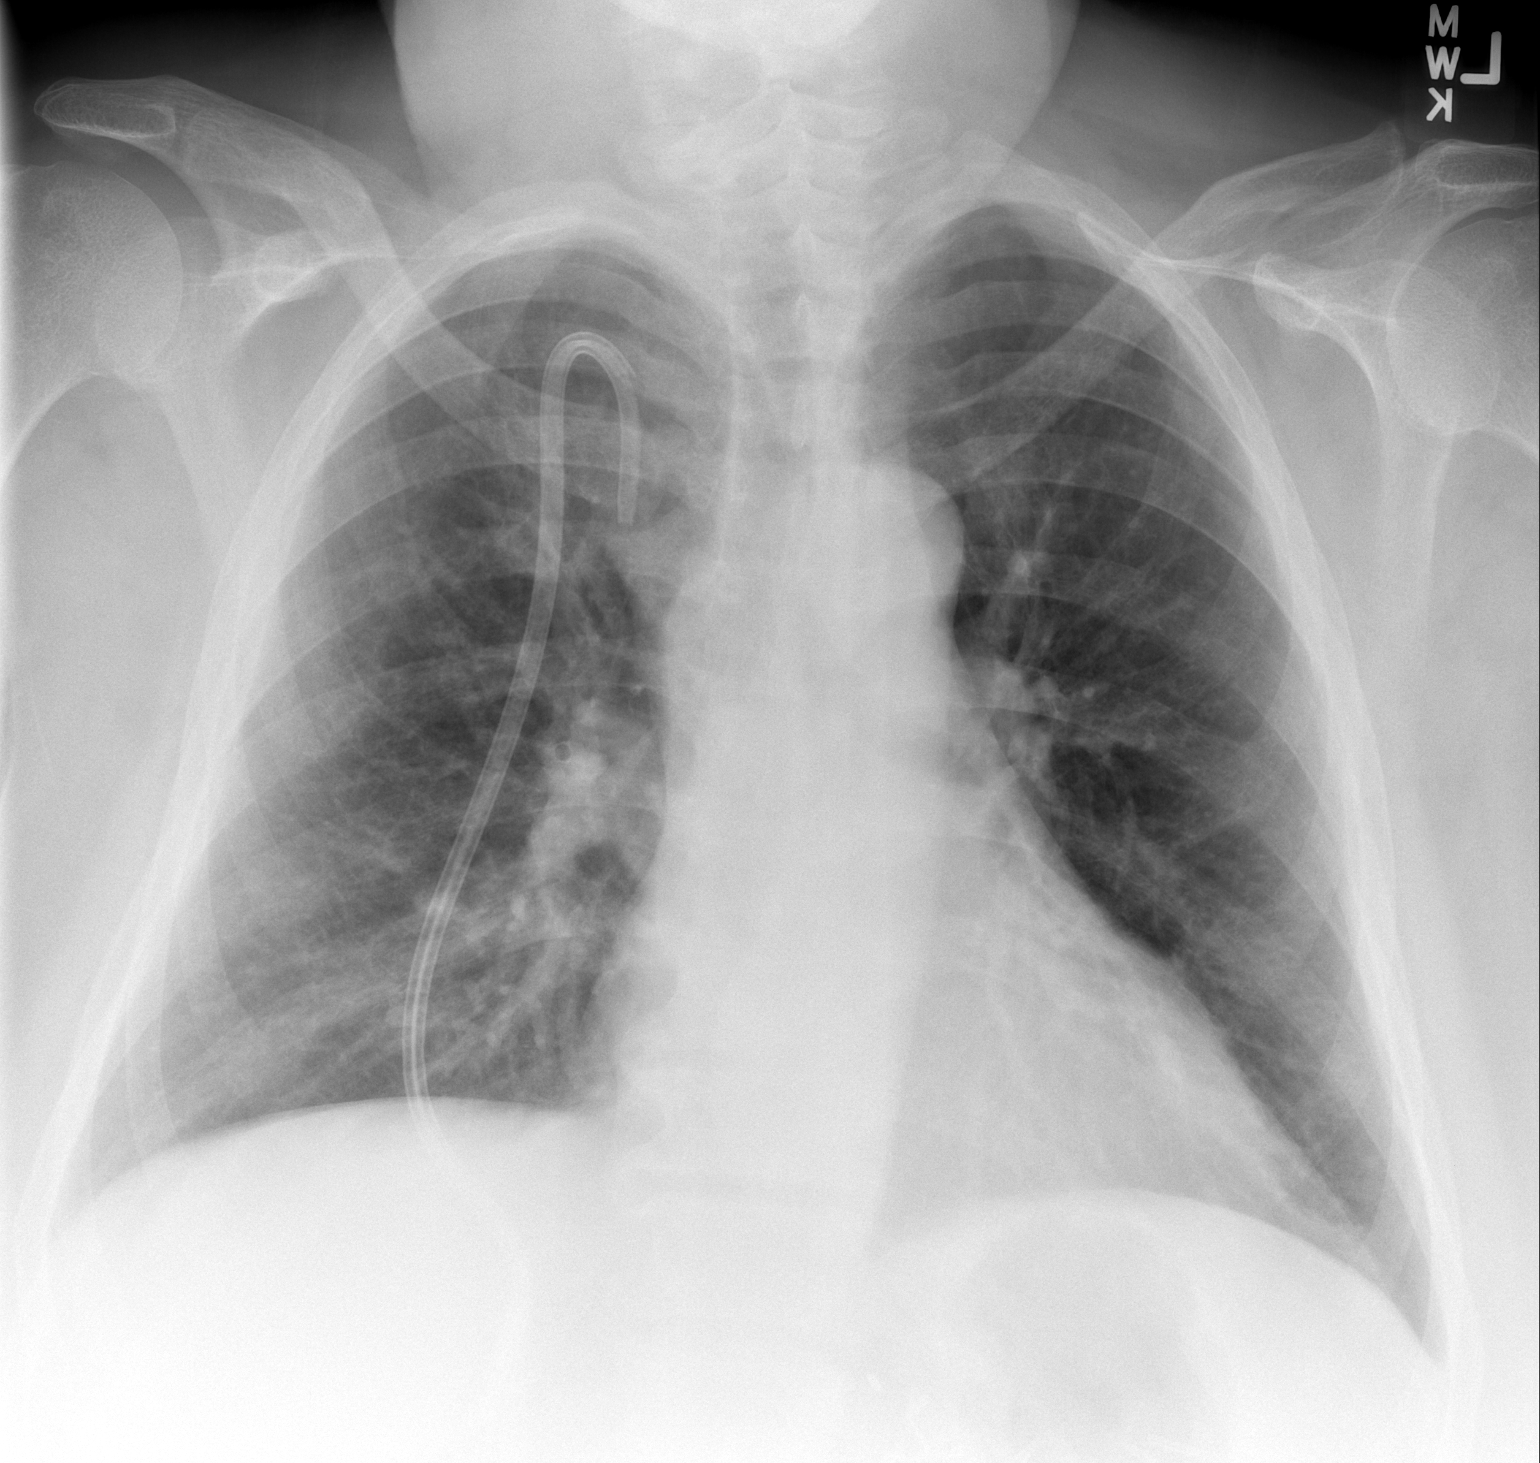

[1 of 1 positions shown; findings below may reference images not displayed]

FINDINGS: Double lumen central venous catheter has been pulled back with the tip of the proximal lumen now just within the superior vena cava.  Proximal lumen is in the subclavian vein. No pneumothorax is identified. 
There is cardiomegaly and vascular congestion.
IMPRESSION: 1.   Central venous catheter has been pulled back and is no longer in good position.  
2.  Cardiomegaly and vascular congestion.

## 2009-01-10 IMAGING — CR DG CHEST 2V
1 series · 1 of 1 positions shown · non-contrast
Comparison: 01/08/2007

CLINICAL DATA: Chest pain and fever

[view not recorded]
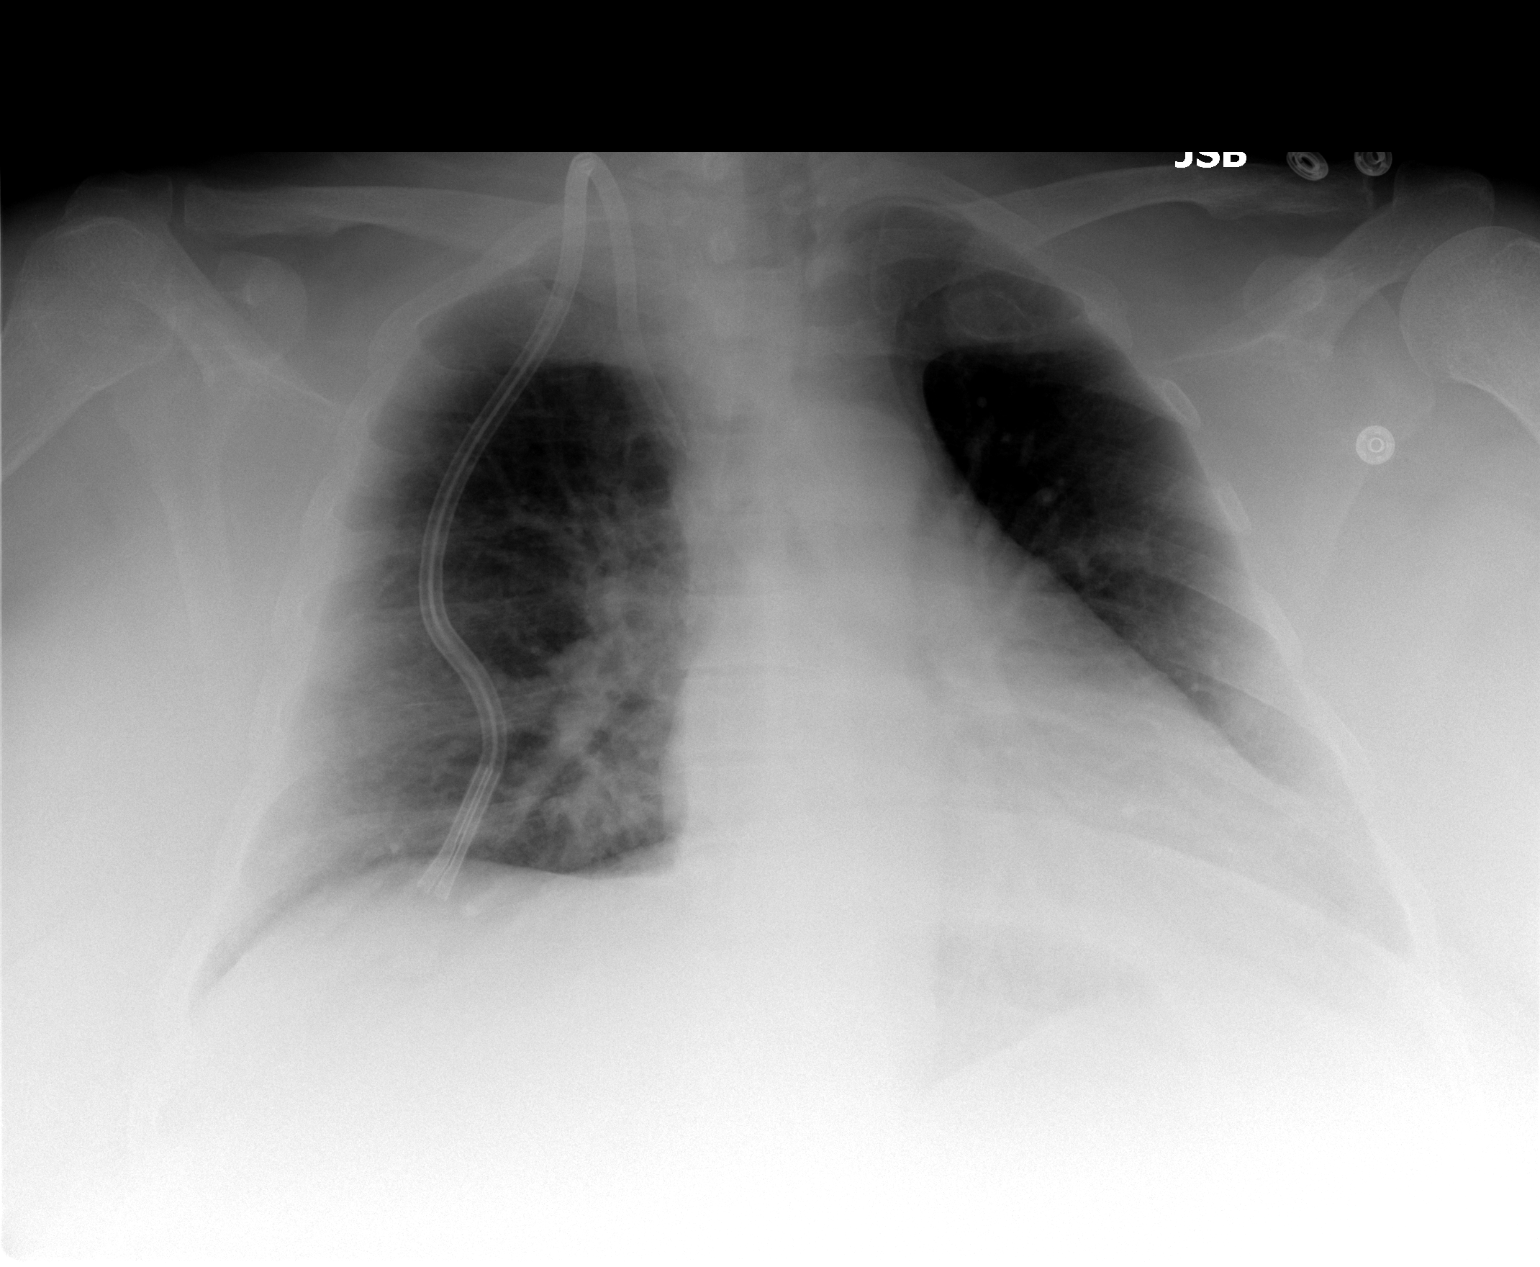

[1 of 1 positions shown; findings below may reference images not displayed]

CHEST - 2 VIEW:

Lung volumes are low. The cardiopericardial silhouette is enlarged. The
right-sided dialysis catheter remains in place. The distal tip is in the region
of the innominate vein confluence. There is pulmonary vascular congestion
without focal airspace consolidation or pleural effusion.
IMPRESSION: Low volume film with pulmonary vascular congestion.

The tip of the dialysis catheter is at the region of the innominate vein
confluence.

## 2009-01-13 IMAGING — CR DG CHEST 2V
2 series · 2 of 2 positions shown · non-contrast
Comparison: none

CLINICAL DATA: Chest pain, 
CHEST - 2 VIEW:

[w chest pa]
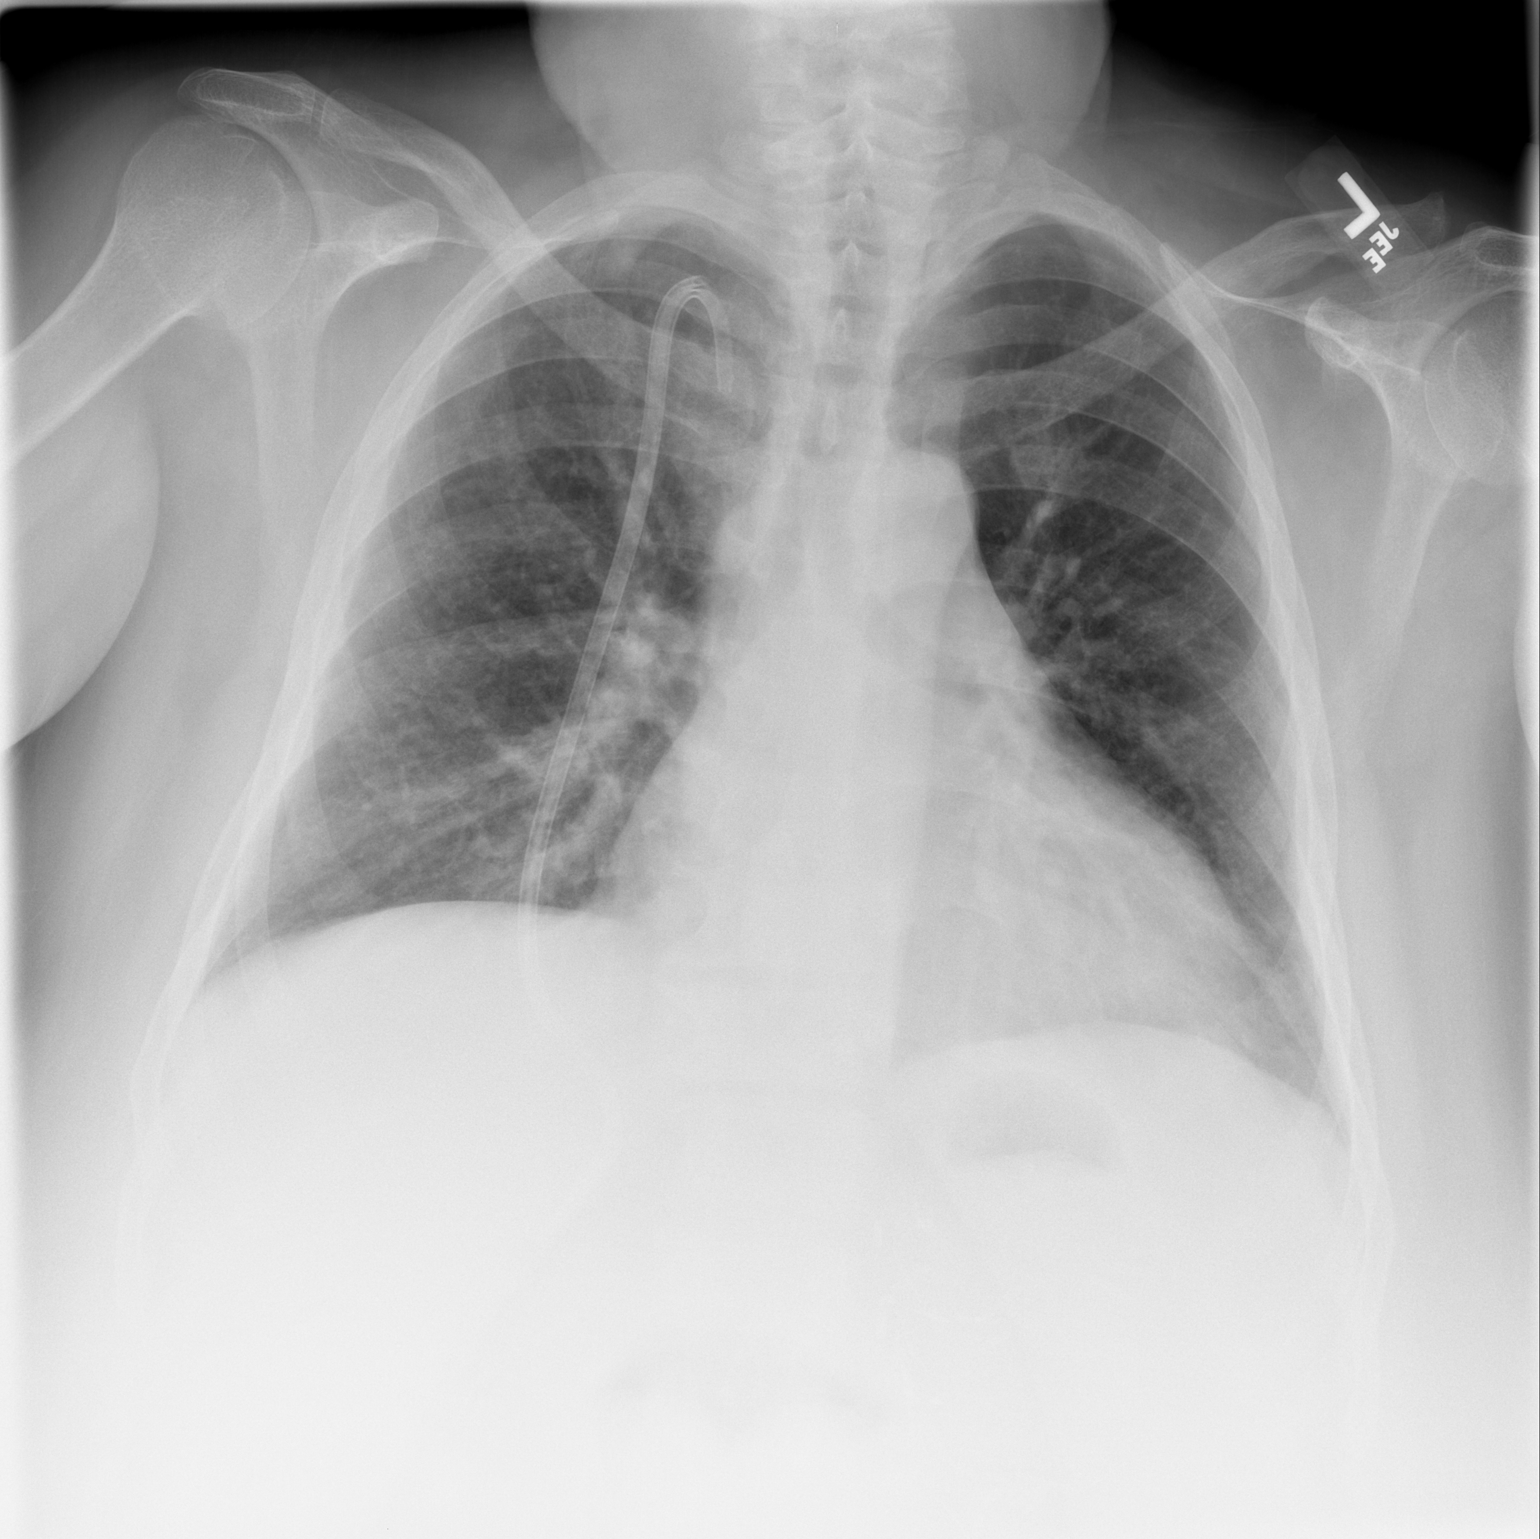

[w chest lat]
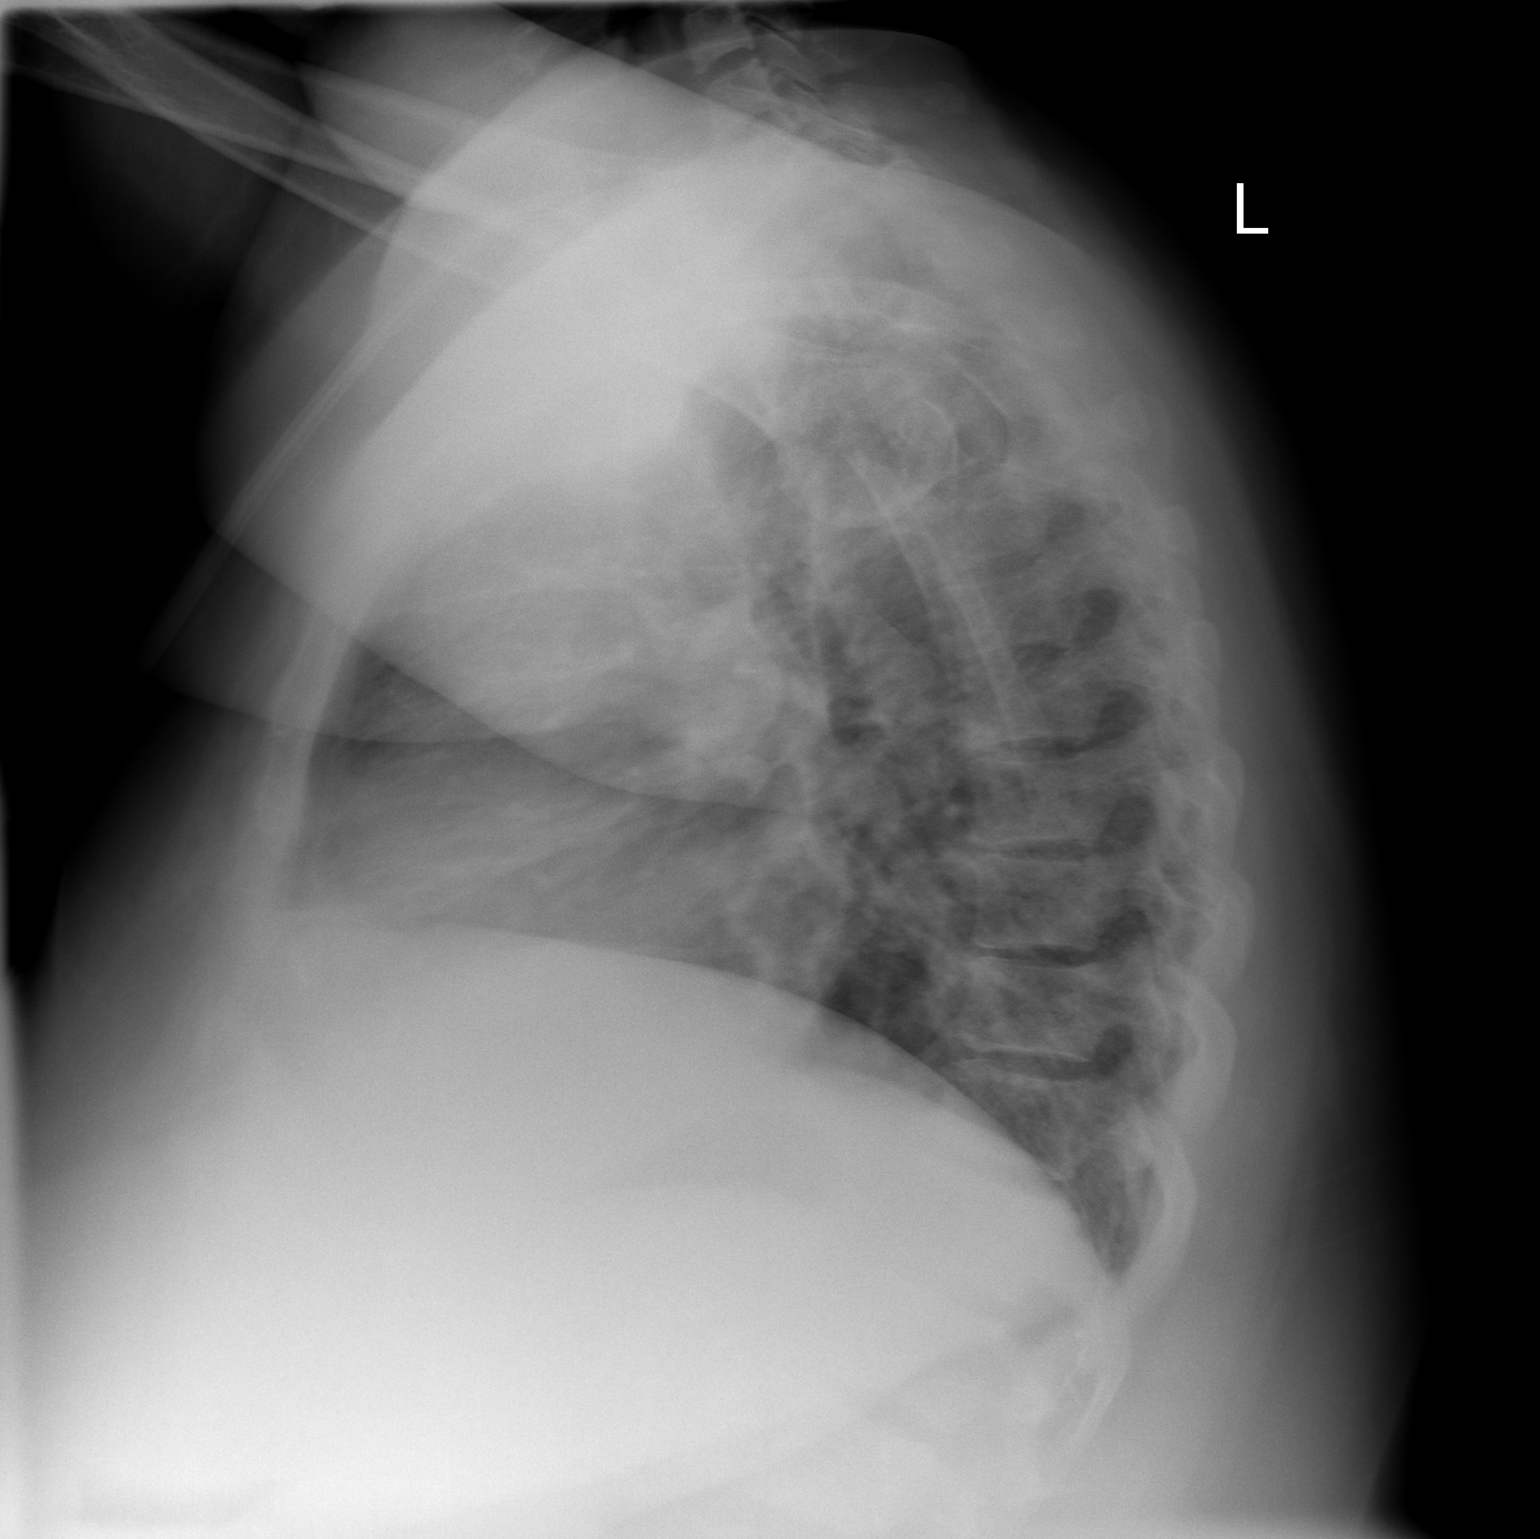

[2 of 2 positions shown; findings below may reference images not displayed]

FINDINGS: Cardiomegaly and pulmonary vascular congestion identified.  A right central venous catheter is noted with tip in the region of the right brachiocephalic vein.  Correlate clinically and as to function of this catheter as there is only a short portion of the catheter extending inferiorly.  
No evidence of pleural effusions or pneumothorax.
IMPRESSION: 1.  Cardiomegaly and pulmonary vascular congestion. 
2.  Right central venous catheter tip overlies the right brachiocephalic vein ? correlate clinically as only a small portion of the catheter extends inferiorly.

## 2009-02-17 ENCOUNTER — Telehealth (INDEPENDENT_AMBULATORY_CARE_PROVIDER_SITE_OTHER): Payer: Self-pay | Admitting: *Deleted

## 2009-02-17 ENCOUNTER — Telehealth: Payer: Self-pay | Admitting: Internal Medicine

## 2009-02-21 ENCOUNTER — Encounter: Payer: Self-pay | Admitting: Internal Medicine

## 2009-02-22 IMAGING — CT CT HEAD W/O CM
4 series · 17 of 30 positions shown, 19 images · IV contrast (agent unspecified)
Comparison: none

CLINICAL DATA: Dialysis.  Altered level of consciousness.  
HEAD CT WITHOUT CONTRAST:
TECHNIQUE: Contiguous axial images were obtained from the base of the skull through the vertex according to standard protocol without contrast.

[Series 2: brain · axial · 0.47mm/px · z∈[+142,+245]mm · 5 of 28 slices shown, 7 images (1 of 2)]
[im 5/28  brain]
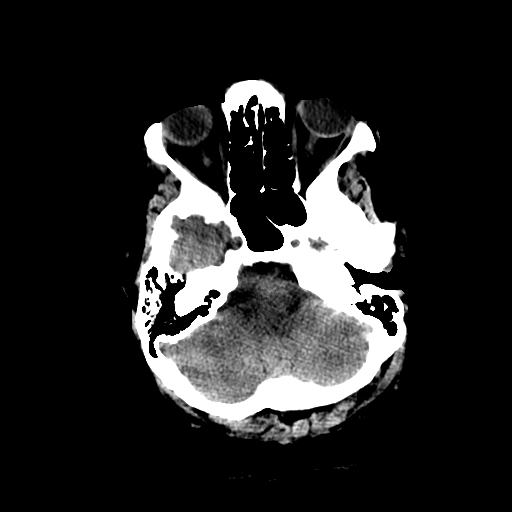
[im 5/28  bone]
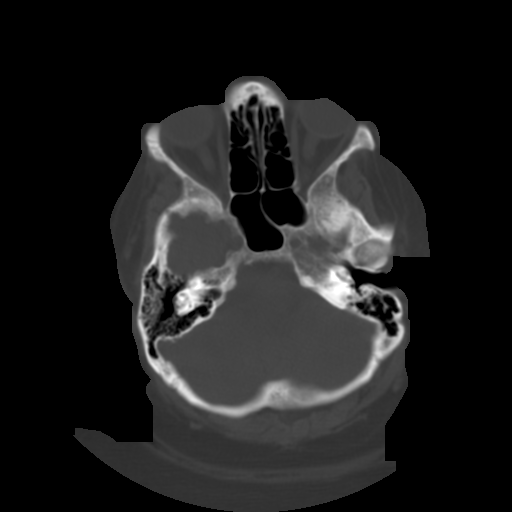
[im 10/28  brain]
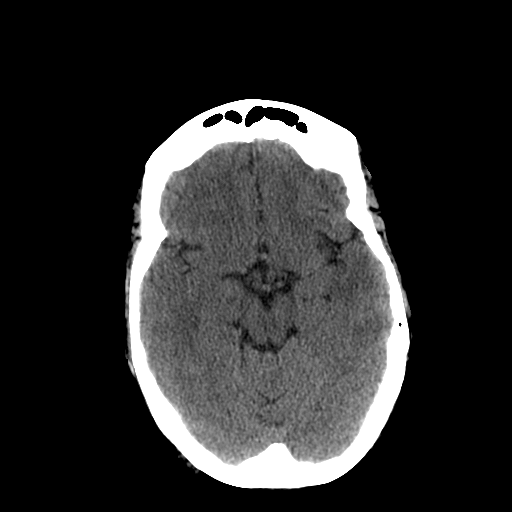
[im 14/28  brain]
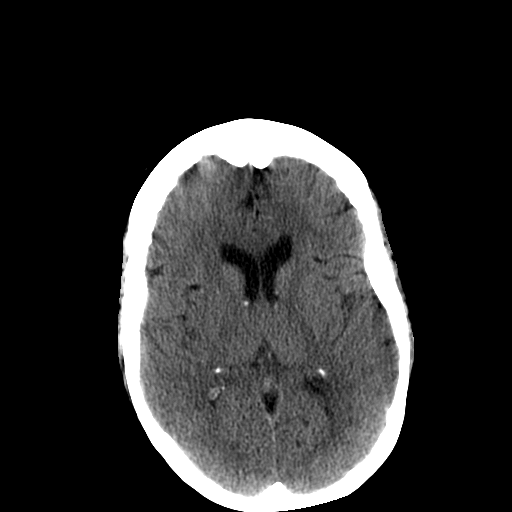
[im 19/28  brain]
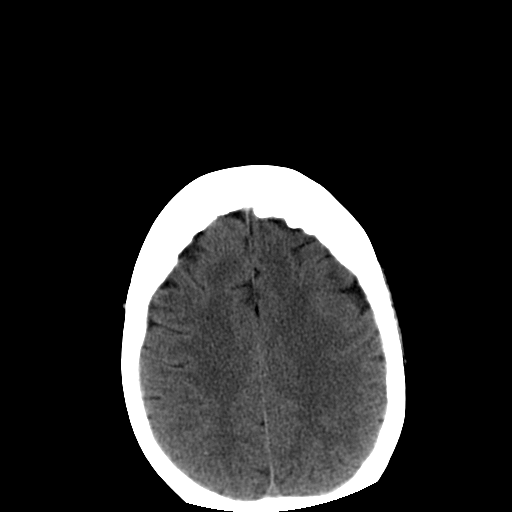
[im 23/28  brain]
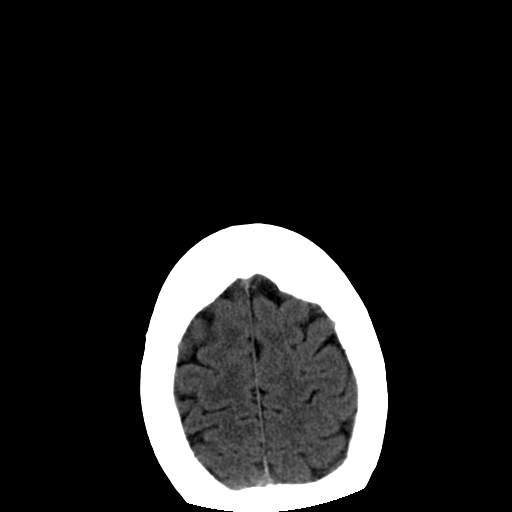
[im 23/28  bone]
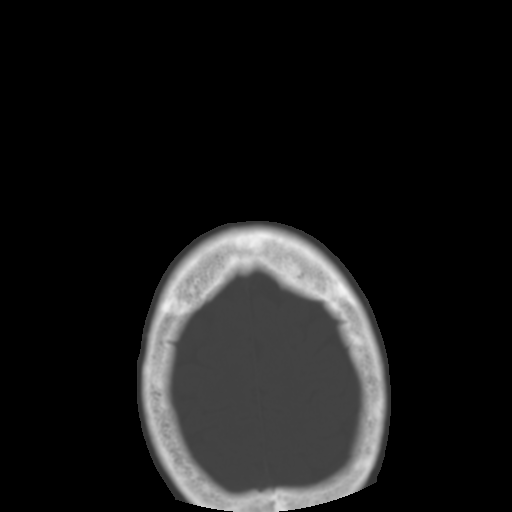

[Series 3: recon 2: brain · axial · 0.47mm/px · z∈[+142,+245]mm · 5 of 28 slices shown (1 of 2)]
[im 5/28  brain]
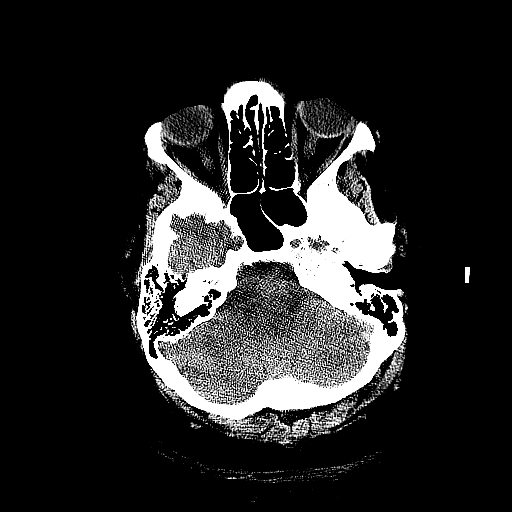
[im 10/28  brain]
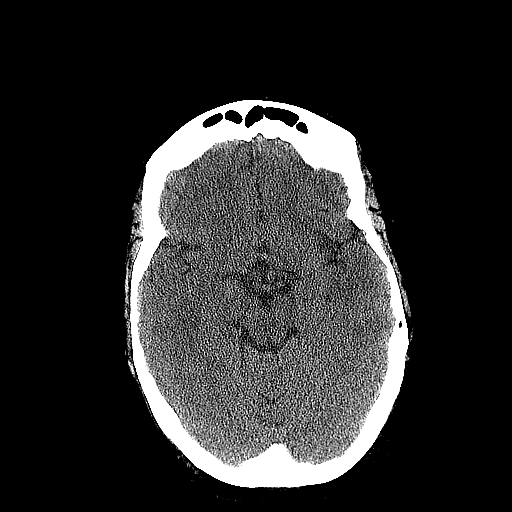
[im 14/28  brain]
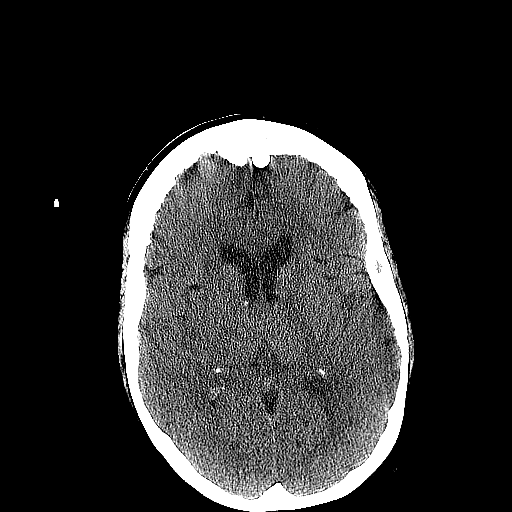
[im 19/28  brain]
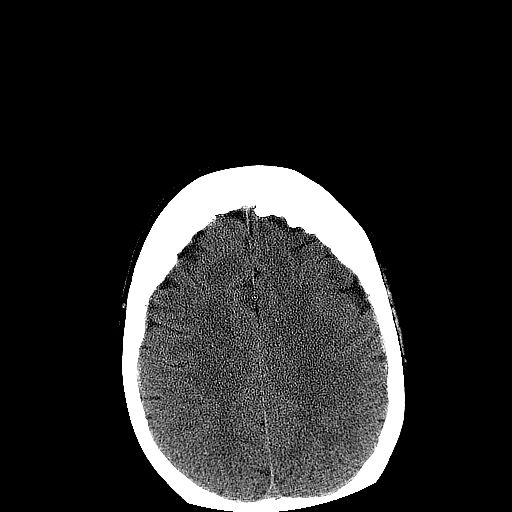
[im 23/28  brain]
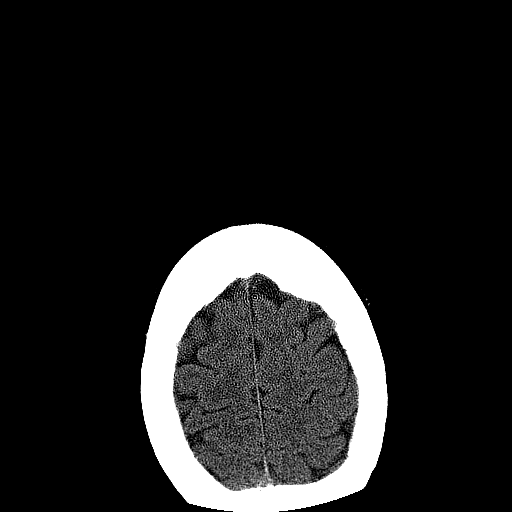

[Series 4: brain · axial · 0.47mm/px · z∈[+99,+201]mm · 6 of 28 slices shown (2 of 2)]
[im 4/28  brain]
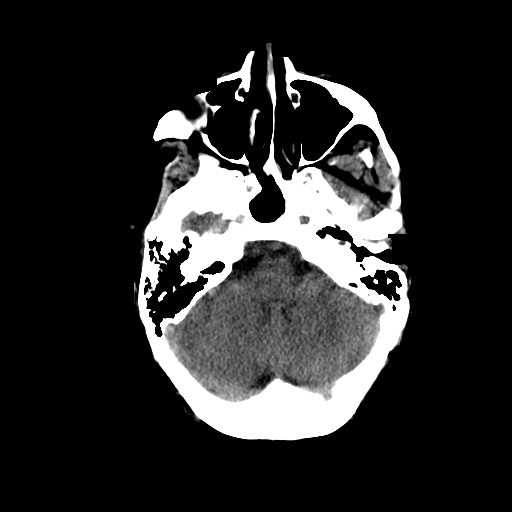
[im 8/28  brain]
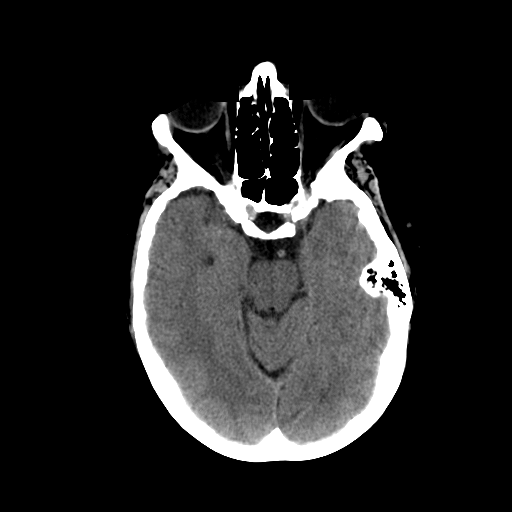
[im 12/28  brain]
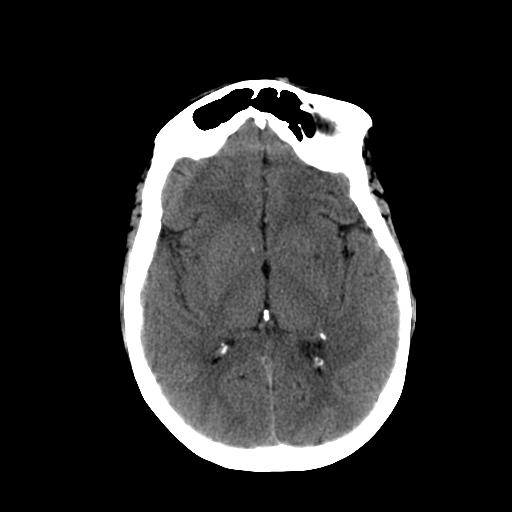
[im 16/28  brain]
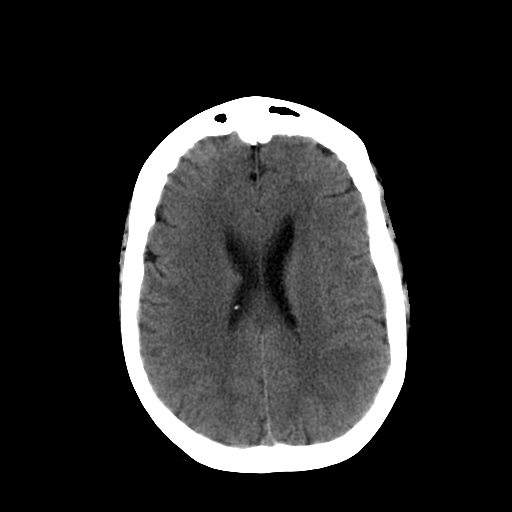
[im 20/28  brain]
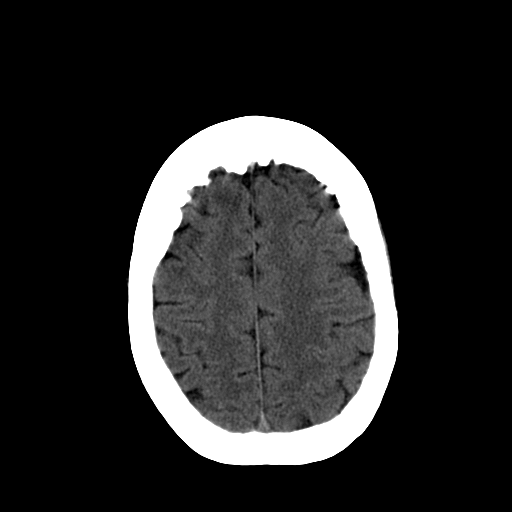
[im 24/28  brain]
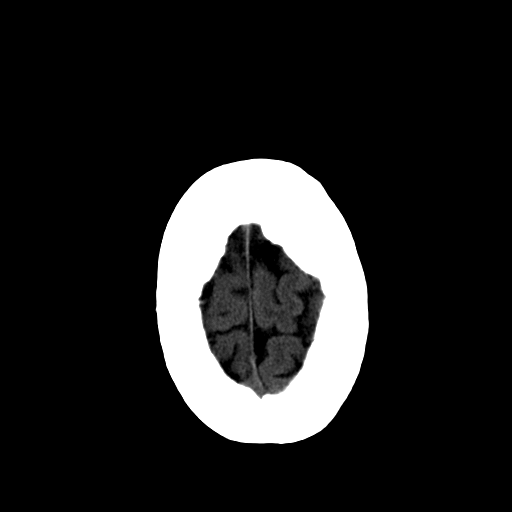

[Series 5: recon 2: brain · axial · 0.47mm/px · 1 of 28 slices shown (2 of 2)]
[im 4/28  brain]
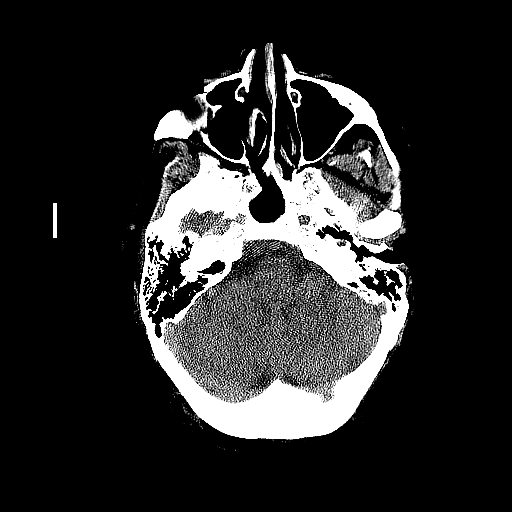

[17 of 30 positions shown; findings below may reference images not displayed]

FINDINGS: Negative for intracranial hemorrhage, mass, or findings to suggest an acute infarction.  Multiple calcified nodules related to the lateral ventricles.  These may represent calcified subependymal tubers from tuberous sclerosis.  No hydrocephalus.
IMPRESSION: No acute intracranial abnormality.  Calcified subependymal nodules are noted and are most compatible with tuberous sclerosis.  consideration although probably less likely would be residua of previous infection.

## 2009-02-22 IMAGING — CR DG CHEST 1V PORT
1 series · 1 of 1 positions shown · non-contrast
Comparison: 01/13/07
 Right central line has been removed.

CLINICAL DATA: 50 year-old with dialysis.  Altered level of consciousness.  History of asthma.  
 PORTABLE CHEST- 1 VIEW:

[view not recorded]
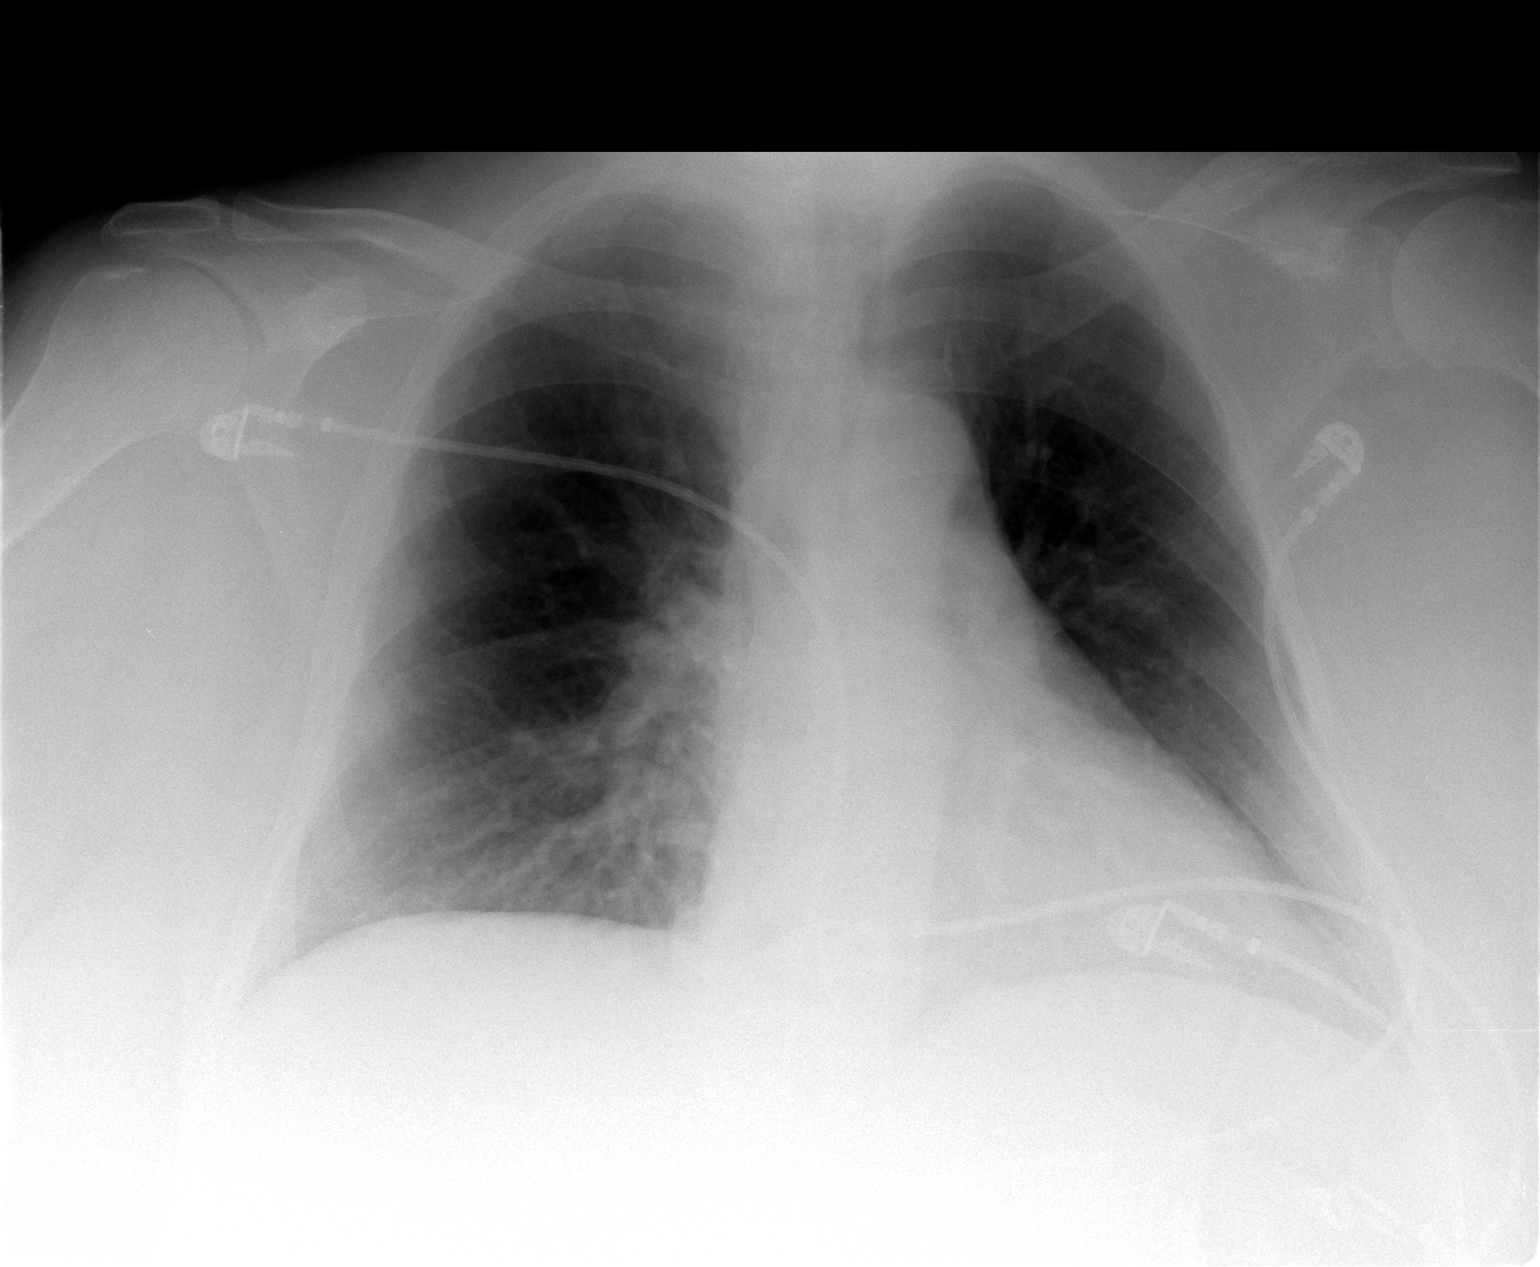

[1 of 1 positions shown; findings below may reference images not displayed]

Heart size is enlarged but there is no evidence for pulmonary edema.  There are no focal consolidations or pleural effusions.
IMPRESSION: Cardiomegaly.

## 2009-03-10 ENCOUNTER — Encounter: Payer: Self-pay | Admitting: Internal Medicine

## 2009-03-16 ENCOUNTER — Ambulatory Visit: Payer: Self-pay | Admitting: Internal Medicine

## 2009-03-16 DIAGNOSIS — K5909 Other constipation: Secondary | ICD-10-CM

## 2009-03-16 DIAGNOSIS — J209 Acute bronchitis, unspecified: Secondary | ICD-10-CM

## 2009-03-16 IMAGING — XA IR AV DIALYSIS ACCESS DECLOT
10 series · 14 of 24 positions shown · non-contrast
Comparison: none

CLINICAL DATA: Left upper extremity clotted graft. 
LEFT AV GRAFT THROMBECTOMY AND AV SHUNTOGRAM:

[Series 1: run · 1 of 8 slices shown (1 of 10)]
[im 1/8]
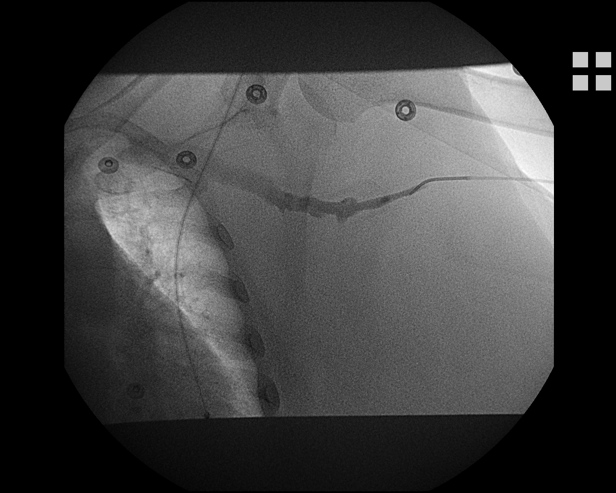

[Series 1: run · 1 of 9 slices shown (2 of 10)]
[im 1/9]
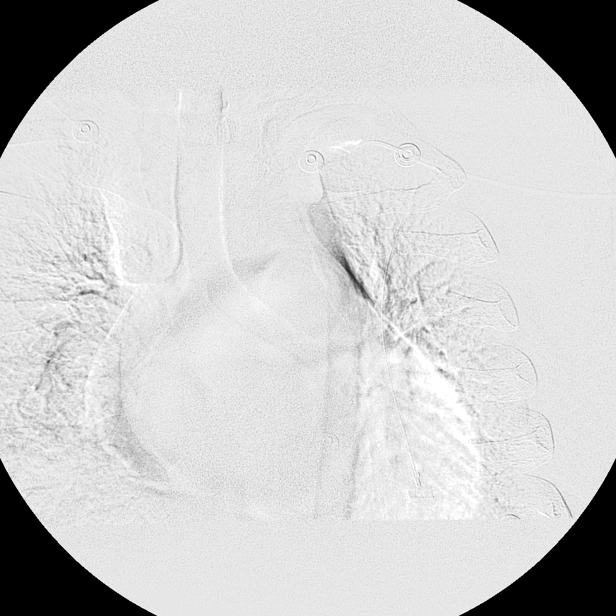

[Series 2: run · 1 of 10 slices shown (3 of 10)]
[im 1/10]
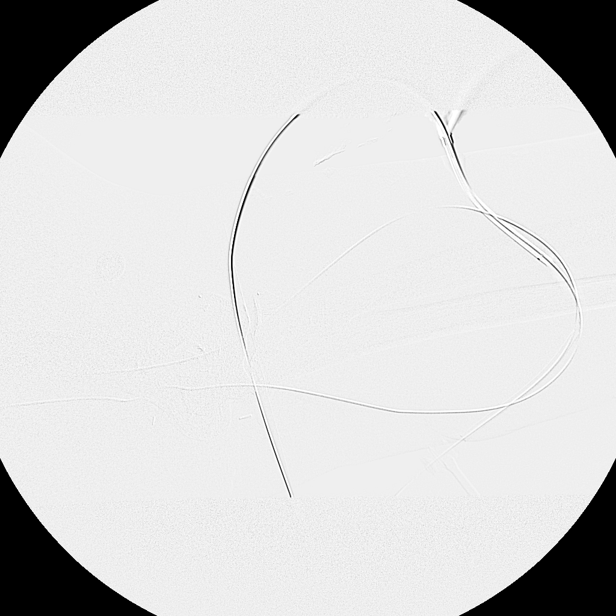

[Series 3: run · 2 of 11 slices shown (4 of 10)]
[im 1/11]
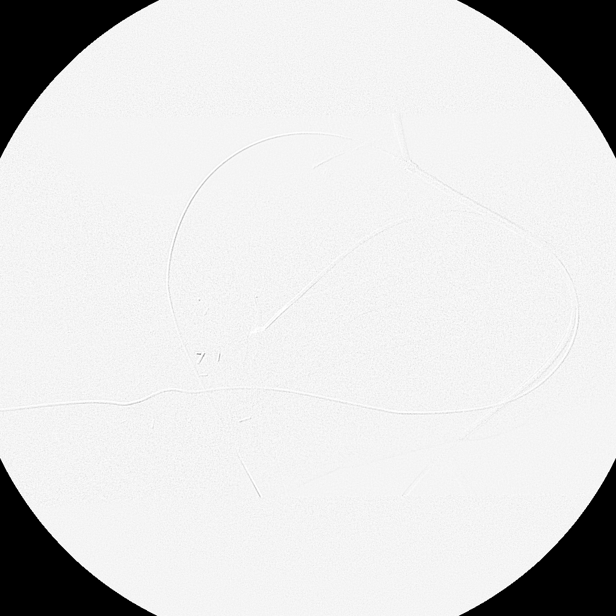
[im 6/11]
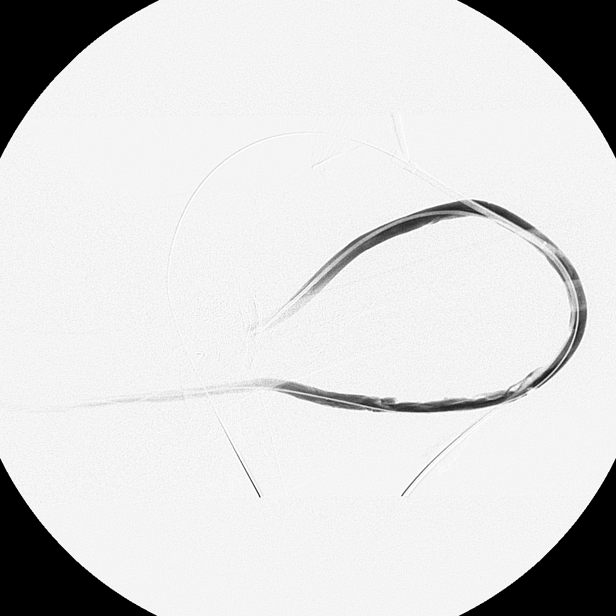

[Series 4: run · 2 of 11 slices shown (5 of 10)]
[im 1/11]
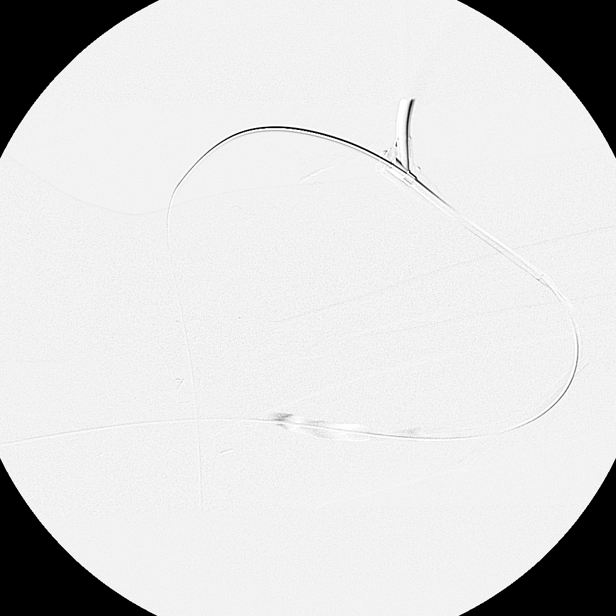
[im 11/11]
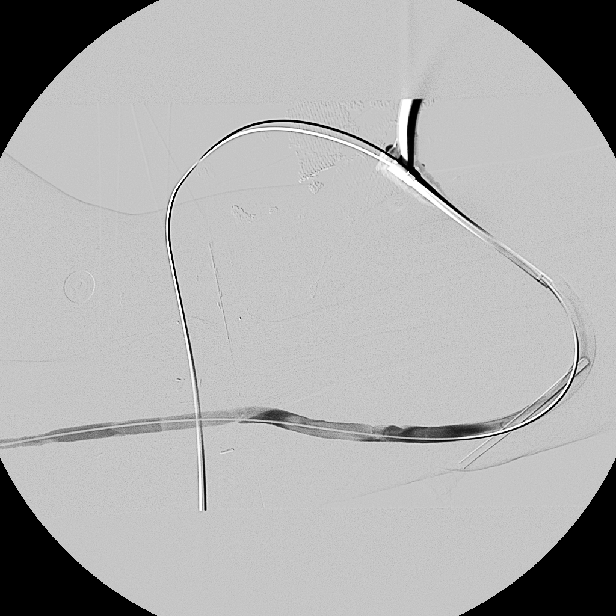

[Series 5: run · 2 of 12 slices shown (6 of 10)]
[im 1/12]
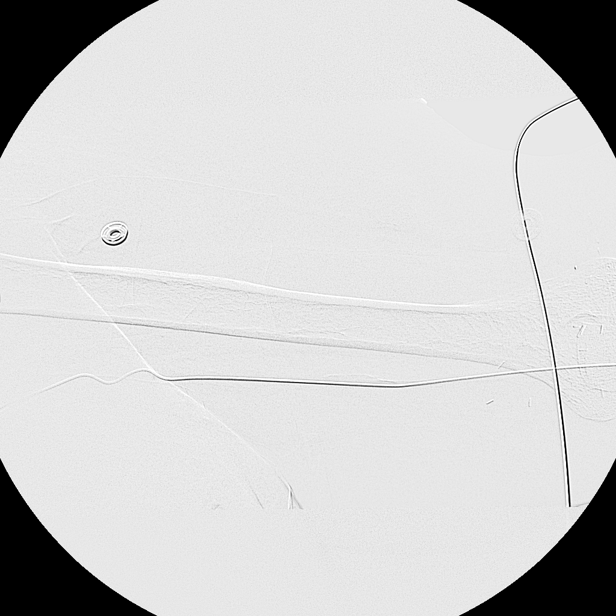
[im 12/12]
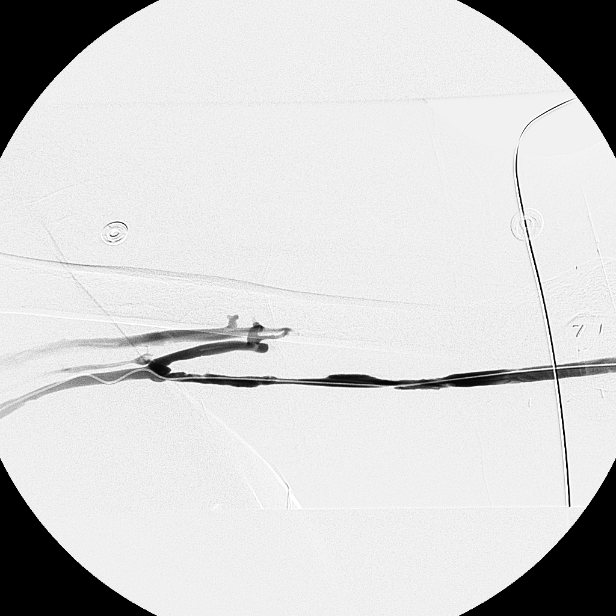

[Series 6: run · 1 of 9 slices shown (7 of 10)]
[im 9/9]
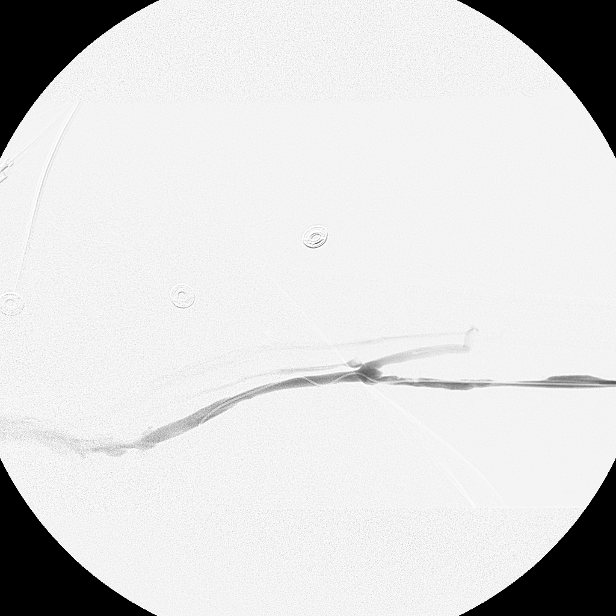

[Series 7: run · 2 of 11 slices shown (8 of 10)]
[im 6/11]
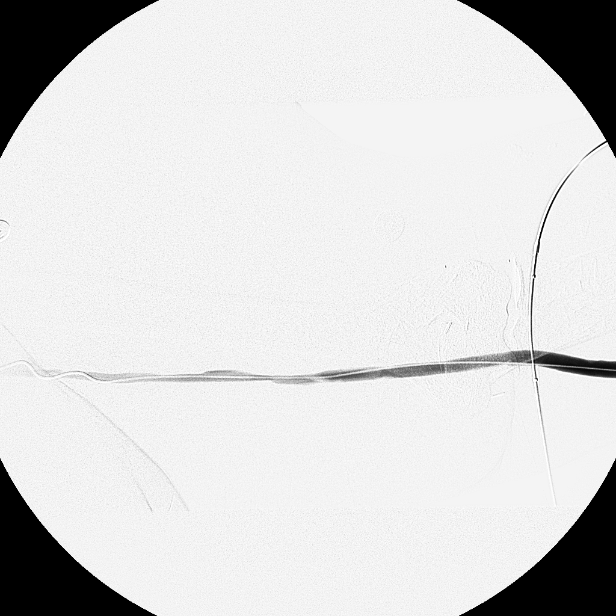
[im 11/11]
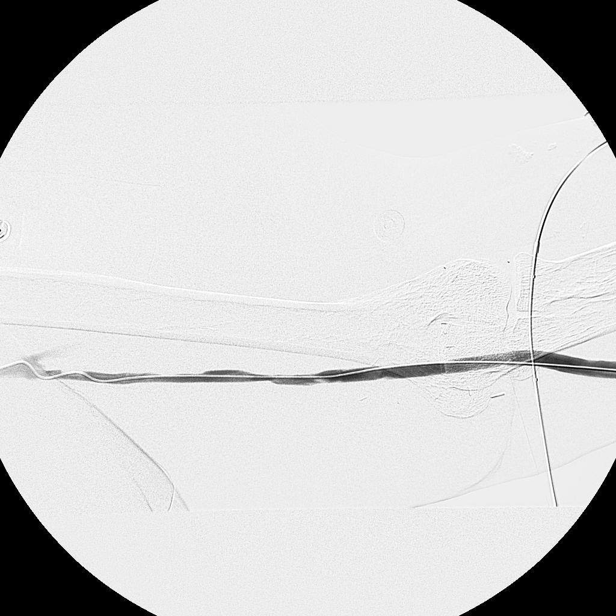

[Series 8: run · 1 of 9 slices shown (9 of 10)]
[im 9/9]
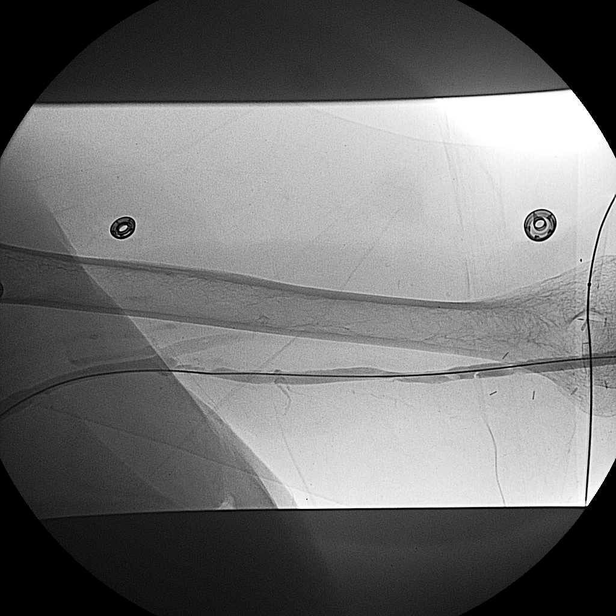

[Series 9: run · 1 of 10 slices shown (10 of 10)]
[im 10/10]
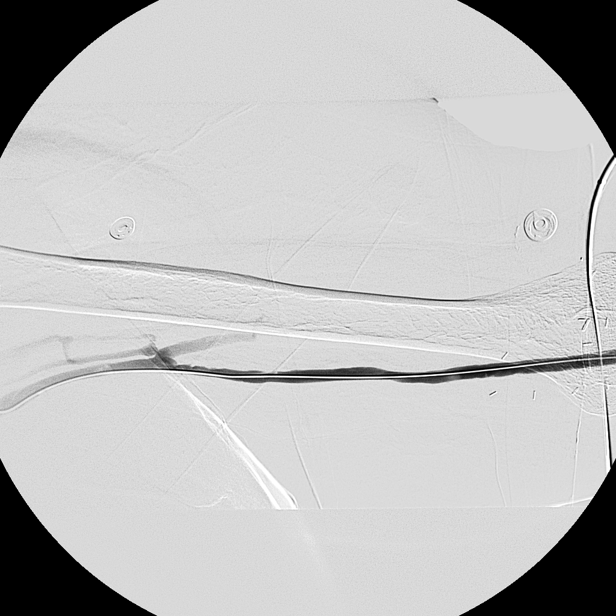

[14 of 24 positions shown; findings below may reference images not displayed]

FINDINGS: The left forearm was prepped and draped in a sterile fashion. Lidocaine was utilized for local anesthesia. Under sonographic guidance, a micropuncture needle was inserted into the AV graft and directed towards the arterial anastomosis.  It was removed over an 0.018 wire.  A 5-French transitional dilator was inserted.  This was exchanged over a Bentson wire for a 6-French sheath. The identical procedure was performed towards the venous anastomosis. A Kumpe catheter was advanced over the Bentson wire into the outflow vein.  Venography was performed.  This was then removed over a Bentson wire.  The Kumpe catheter was utilized to gently advance the Bentson wire across the arterial anastomosis.  The venous limb of the graft was dilated to 6 mm.  The venous anastomosis was dilated to 6 mm.  A Fogarty balloon was then utilized to sweep the graft from the arterial anastomosis across the venous anastomosis in an antegrade fashion.  Venography was performed demonstrating patency of the graft but severe irregularity of the basilic vein in the distal half of the arm.  There are multiple areas of severe stenosis. These areas were first dilated to 6 mm with minimal improvement and finally to 7 mm resulting in patency with severe persistent irregularity. The sheaths were removed and hemostasis was achieved with two 0-Prolene figure 8 stitches. No complications.
FINDINGS: Central venography demonstrates patency of the venous structures from the left arm. 
After thrombectomy of the graft, the graft was noted to be patent; however, there was severe multifocal areas of narrowing and irregularity of the basilic vein in the distal half of the arm.  After dilatation to 6 and 7 mm, there was some improvement but persistent narrowing and irregularity.
IMPRESSION: Successful thrombectomy of the left forearm AV graft. Dilating the basilic vein, which is the outflow vein to 7 mm resulted with patency with significant irregularity and persistent narrowing. Stenting was not performed given the long segment of irregularity.  Long-term patency of this graft is not feasible.

## 2009-03-17 ENCOUNTER — Encounter: Payer: Self-pay | Admitting: Internal Medicine

## 2009-03-18 IMAGING — CR DG CHEST 1V PORT
1 series · 1 of 1 positions shown · non-contrast
Comparison: 03/16/07.

CLINICAL DATA: 50 year old; clotted graft; post-op, in PACU.
 PORTABLE CHEST - 1 VIEW - 03/18/07:

[view not recorded]
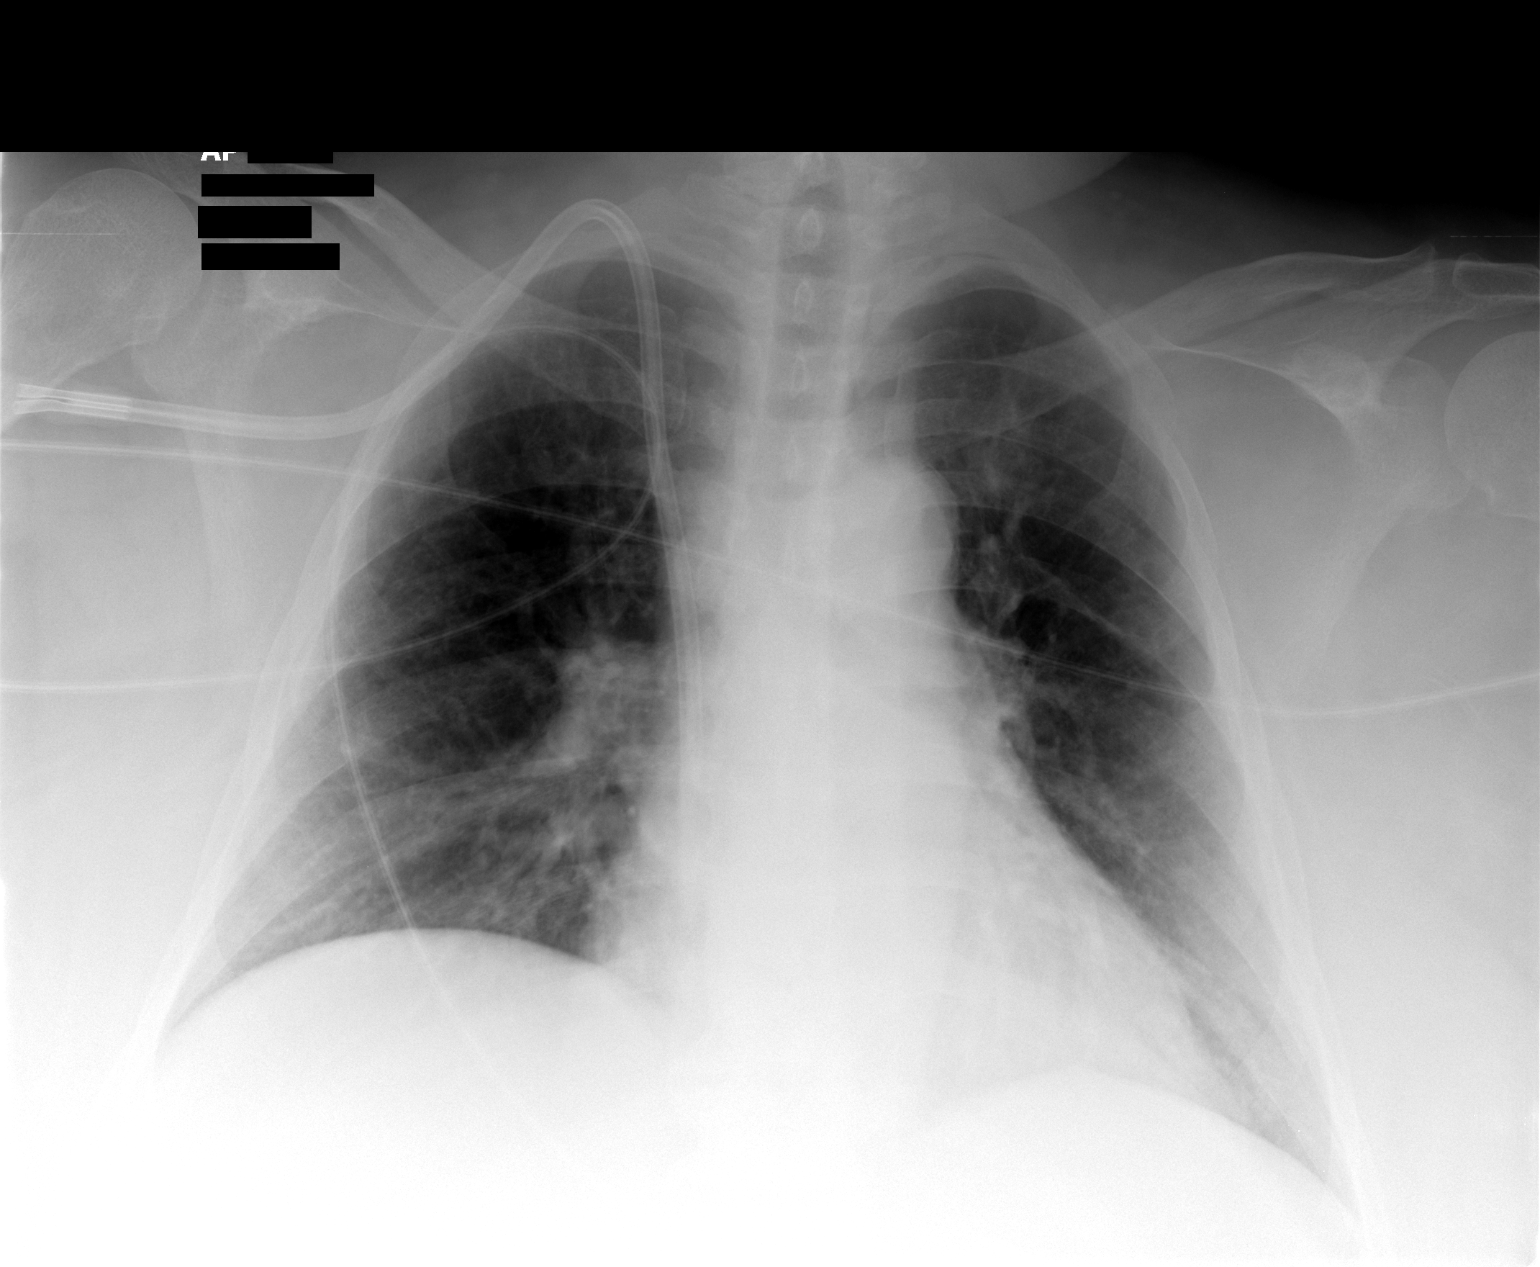

[1 of 1 positions shown; findings below may reference images not displayed]

FINDINGS: The right IJ catheter is in good position with the tips in the distal SVC and right atrium.  No pneumothorax is seen.  Chronic lung changes.
IMPRESSION: 1.  Right IJ catheter tips in good position in the SVC and right atrium.
 2.  No pneumothorax.

## 2009-03-18 IMAGING — CR DG CHEST 2V
2 series · 2 of 2 positions shown · non-contrast
Comparison: none

CLINICAL DATA: Diatek catheter placement.
 CHEST - 2 VIEW - 03/18/07: 
 Comparison study earlier at 2120 hours.

[w chest pa]
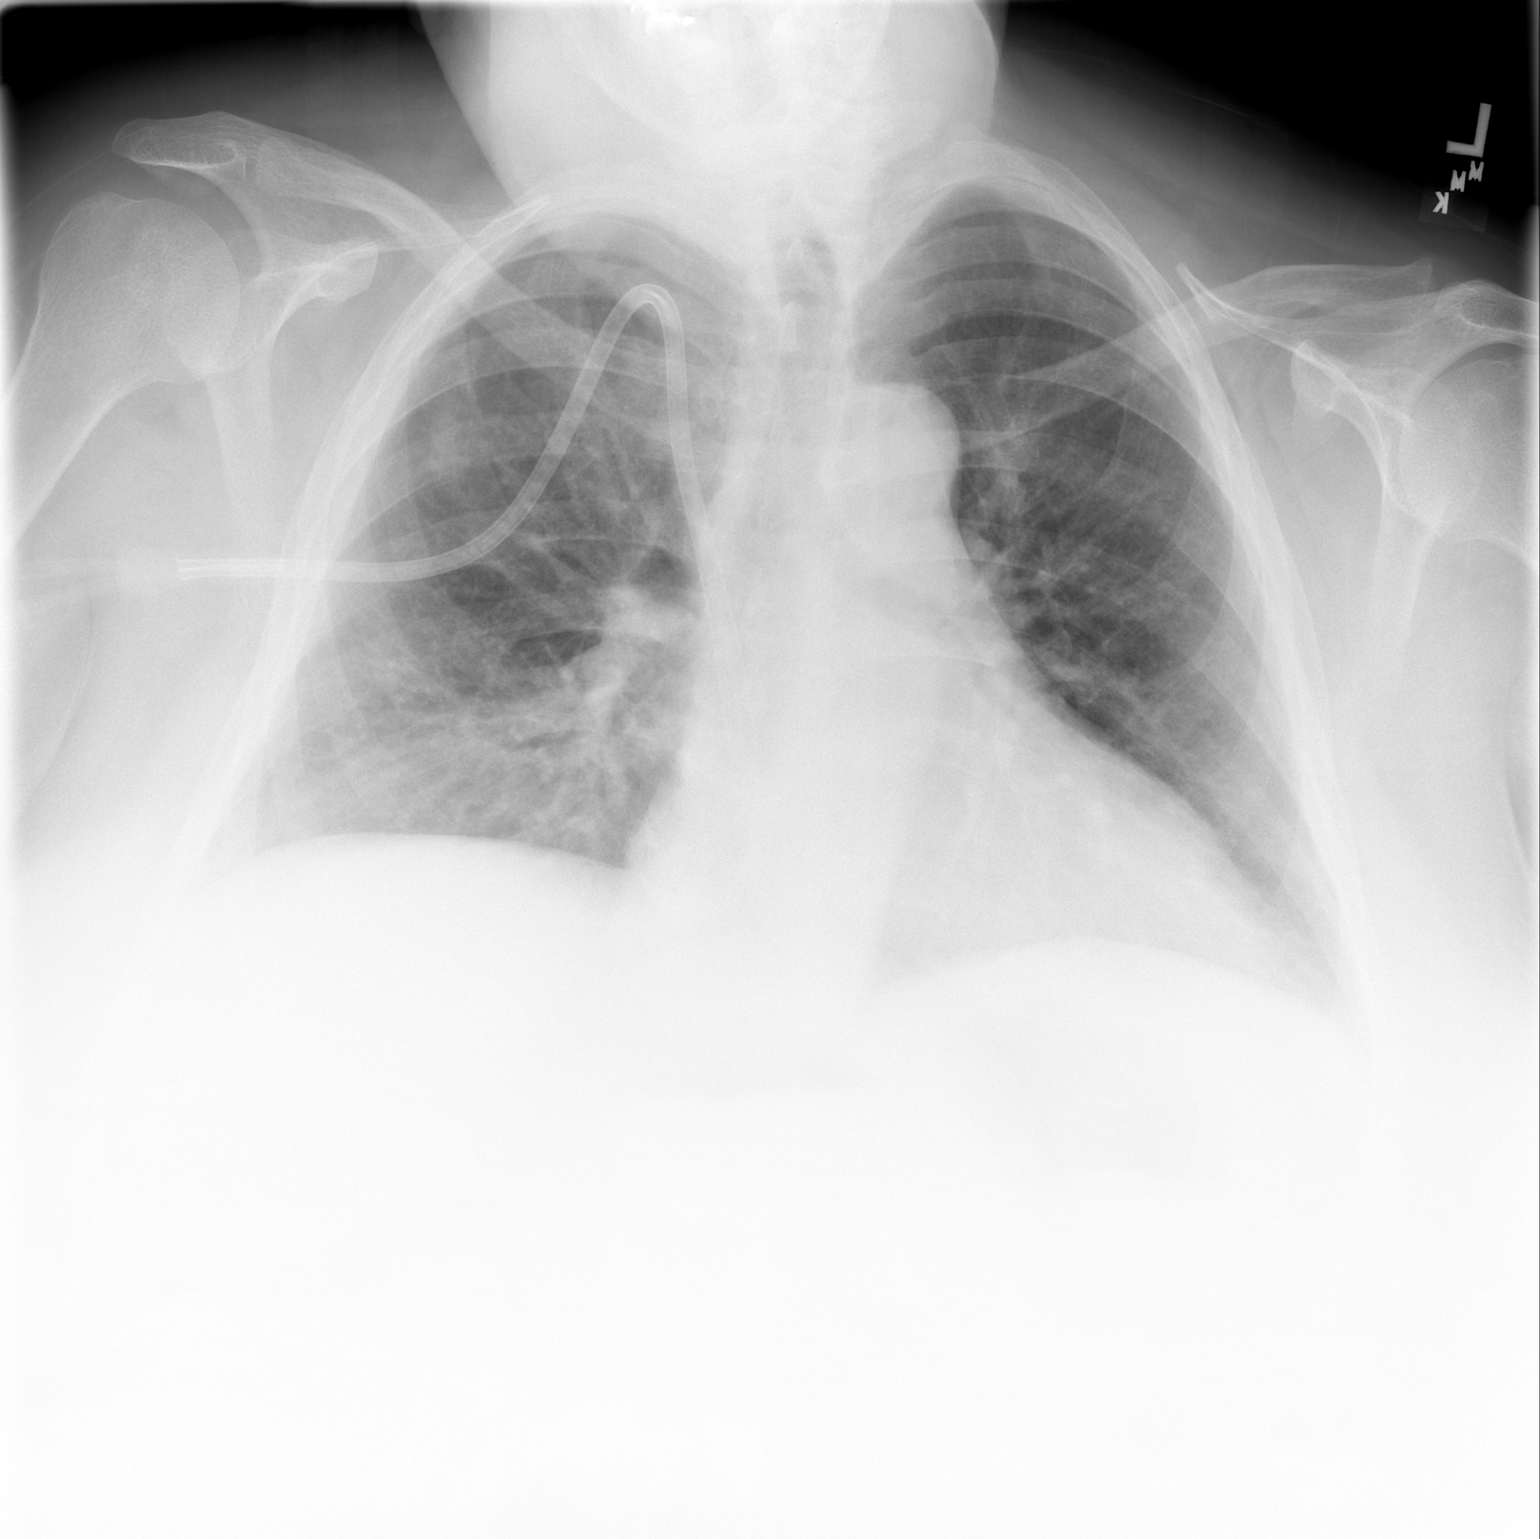

[w chest lat *]
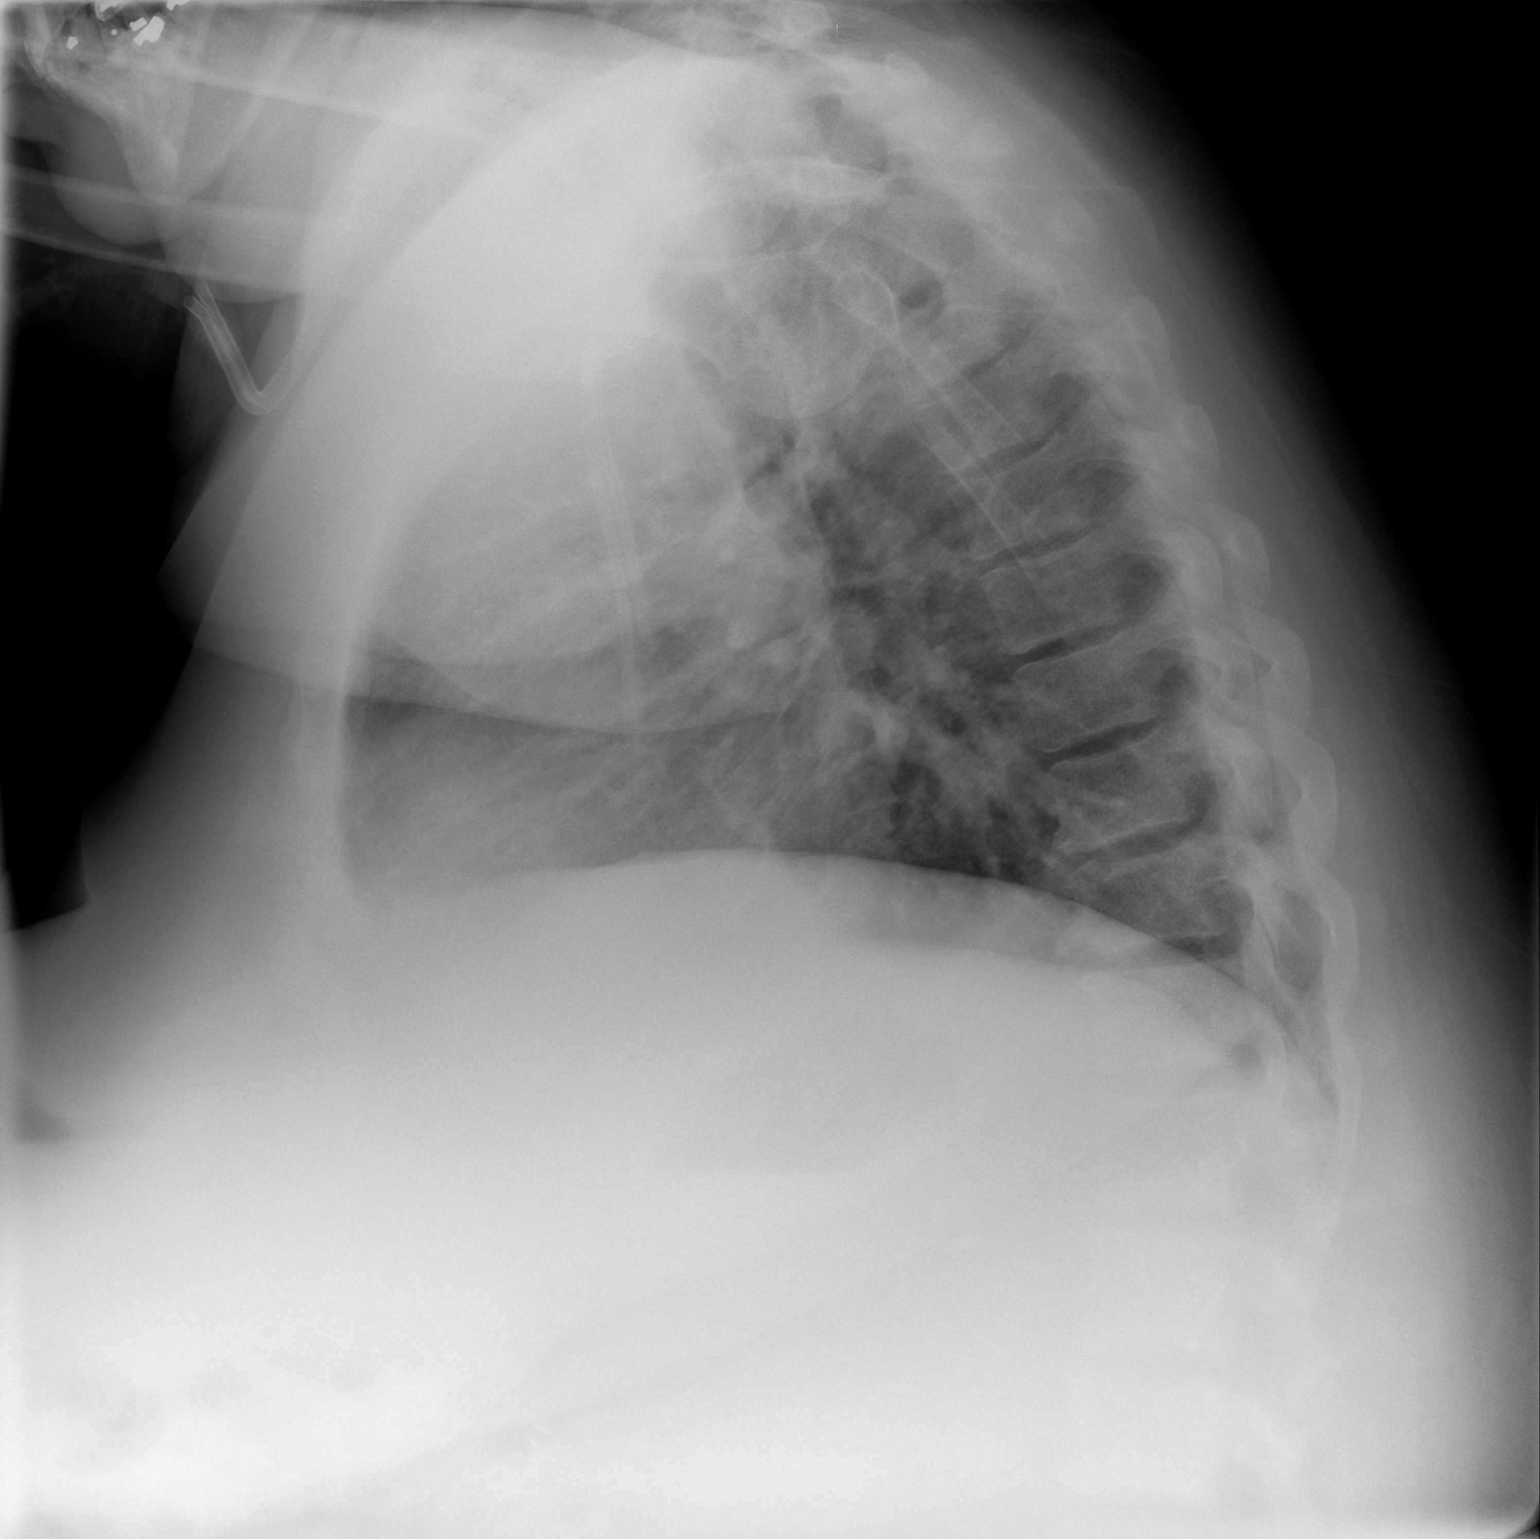

[2 of 2 positions shown; findings below may reference images not displayed]

FINDINGS: A catheter has been placed via the right IJ.  The tip is stable.  There is no pneumothorax.  Films were obtained during low inspiration with basilar subsegmental atelectasis.  Question nodular density in the right upper lung not appreciated previously.
IMPRESSION: 1.  The catheter is stable without pneumothorax.
 2.  Question nodule in right upper lung.

## 2009-03-24 ENCOUNTER — Emergency Department (HOSPITAL_COMMUNITY): Admission: EM | Admit: 2009-03-24 | Discharge: 2009-03-24 | Payer: Self-pay | Admitting: Emergency Medicine

## 2009-03-28 ENCOUNTER — Telehealth: Payer: Self-pay | Admitting: Internal Medicine

## 2009-04-05 ENCOUNTER — Telehealth: Payer: Self-pay | Admitting: Internal Medicine

## 2009-04-07 ENCOUNTER — Telehealth (INDEPENDENT_AMBULATORY_CARE_PROVIDER_SITE_OTHER): Payer: Self-pay | Admitting: *Deleted

## 2009-04-13 ENCOUNTER — Telehealth: Payer: Self-pay | Admitting: Internal Medicine

## 2009-04-21 ENCOUNTER — Telehealth: Payer: Self-pay | Admitting: Internal Medicine

## 2009-04-25 ENCOUNTER — Emergency Department (HOSPITAL_COMMUNITY): Admission: EM | Admit: 2009-04-25 | Discharge: 2009-04-25 | Payer: Self-pay | Admitting: Emergency Medicine

## 2009-04-25 IMAGING — US IR AV DIALYSIS ACCESS DECLOT
1 series · 2 of 2 positions shown · non-contrast
Comparison: none

CLINICAL DATA: Thromboses left arm AV graft.
 ULTRASOUND GUIDANCE FOR ACCESS VENOUS ANGIOPLASTY LEFT UPPER EXTREMITY AV GRAFT THROMBECTOMY ? 04/25/07 0063 HOURS:
 Procedure:  The left arm was prepped and draped in a sterile fashion and lidocaine was utilized for local anesthesia.  Under sonographic guidance, a micropuncture needle was inserted into the graft directed towards the venous anastomosis and removed over an 0.018 wire.  A 5 French transitional dilator was inserted.  1 mg TPA was injected.  It was exchanged over Lesya Aujla for a 6 French sheath.  The identical procedure was performed towards the arterial anastomosis.  Kumpe catheter was utilized to advance the Bentson wire across both the arterial and venous anastomoses.  A Kumpe catheter was advanced into the central left venous structures for central venography demonstrating patency.
 A 6 mm balloon was utilized to dilate the venous limb of the graft and venous anastomosis.  A Fogarty balloon was utilized to sweep the arterial limb of the graft then the venous anastomosis in an antegrade fashion.  Repeat imaging demonstrates antegrade flow but residual thrombus in the graft.  The graft was then dilated to 7 mm.  The Fogarty balloon was then utilized to sweep the arterial and venous limbs of the graft in an antegrade fashion.  Repeat injection confirms re-thrombosis of the graft.  A third and final attempt of this procedure was performed and again antegrade flow could not be established.  A tiny amount of contrast had extravasated at the venous anastomosis.  Both sheaths were removed and hemostasis was achieved with two 0 Prolene figure 8 stitches.  No complications.

[Series 1: ir av dialysis access declot · 2 of 2 slices shown]
[im 1/2]
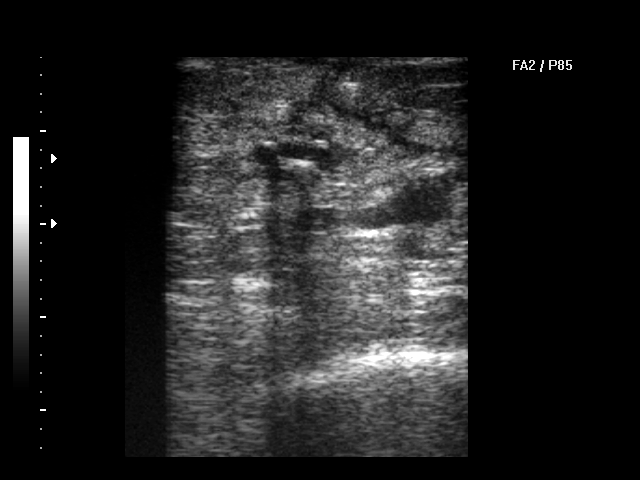
[im 2/2]
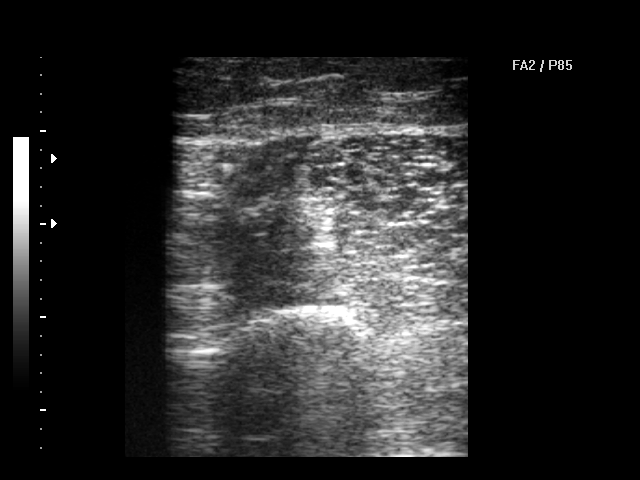

[2 of 2 positions shown; findings below may reference images not displayed]

FINDINGS: Central venography demonstrates patency of the left subclavian, innominate vein, and SVC. 
 Subsequent attempts at thrombectomy were performed.  However, patency and clearance of the graft could not be attained.
IMPRESSION: Unsuccessful left arm AV graft thrombectomy as described.

## 2009-04-26 ENCOUNTER — Ambulatory Visit (HOSPITAL_COMMUNITY): Admission: RE | Admit: 2009-04-26 | Discharge: 2009-04-26 | Payer: Self-pay | Admitting: Nephrology

## 2009-05-06 ENCOUNTER — Encounter: Payer: Self-pay | Admitting: Internal Medicine

## 2009-05-07 IMAGING — XA IR [PERSON_NAME]/EXT/UNI*R*
1 series · 12 of 24 positions shown · IV contrast (omnipaque)
Comparison: none

CLINICAL DATA: Failed dialysis access in the left upper extremity.  The patient now required the right upper extremity venogram to determine any useable veins for new hemodialysis access.  She currently is dialyzed through a tunneled catheter placed via the right internal jugular vein. 
RIGHT UPPER EXTREMITY VENOGRAM ? 05/07/07:
Contrast:  80 cc Omnipaque 300. 
Fluoro time:  1.5 minutes. 
IV access was established in the right hand.  Contrast venogram was then performed with visualization of venous drainage throughout the right upper extremity as well as the central veins of the chest. 
The veins of the right distal arm are tiny in caliber.  Superficial veins eventually reconstitute flow in a tortuous small-caliber cephalic vein that is patent to the point of central drainage into the right subclavian vein.  The cephalic vein is very small throughout its course. 
Deep veins of the right upper arm are patent.  Initially, with the arm nearly down at the side, there was suggestion of stenosis of the high basilic vein near the axilla.   However, when the patient's arm is extended [DATE] degrees, venography does not show a significant venous narrowing.  The deep veins are partially duplicated in the right upper arm with parallel basilic vein drainage noted, ultimately emptying into a single axillary vein, which is normally patent.  The subclavian vein also shows normal patency.  There is a suggestion of mild narrowing of the brachiocephalic vein around a preexisting tunneled catheter.  The SVC is widely patent.

[Series 1: run · 12 of 71 slices shown]
[im 4/71]
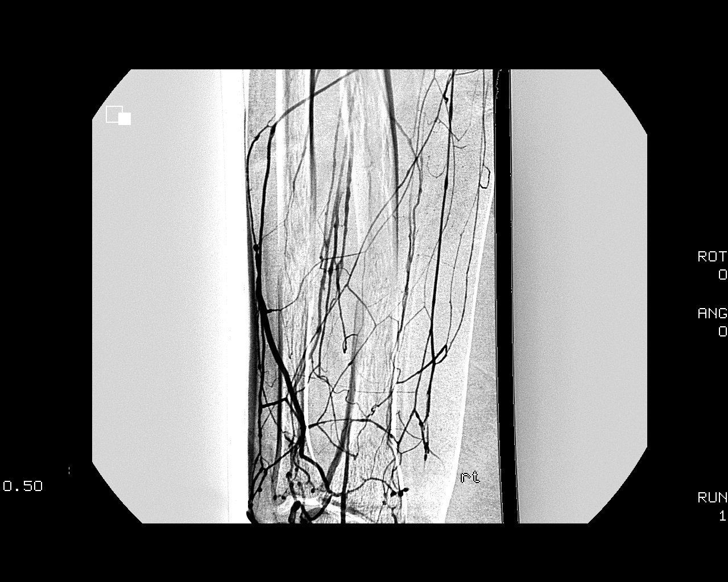
[im 10/71]
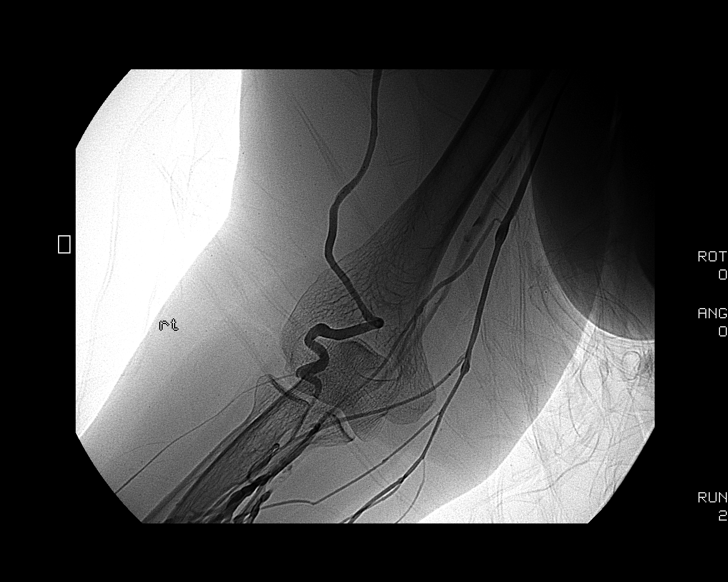
[im 16/71]
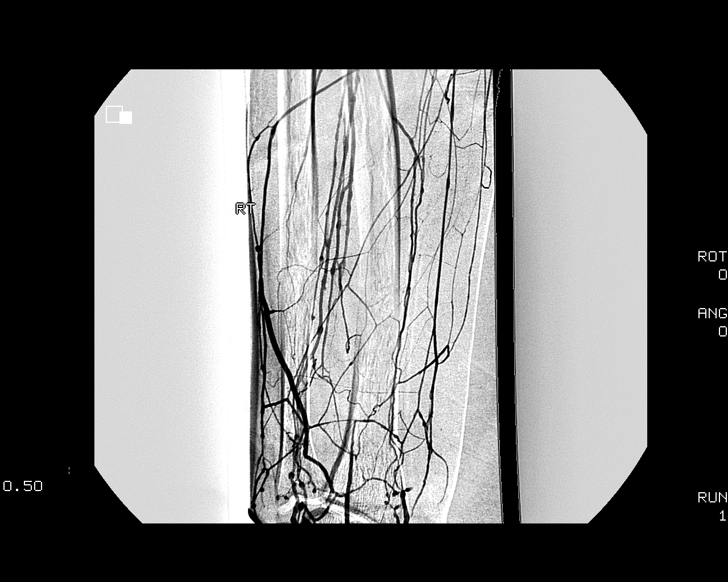
[im 22/71]
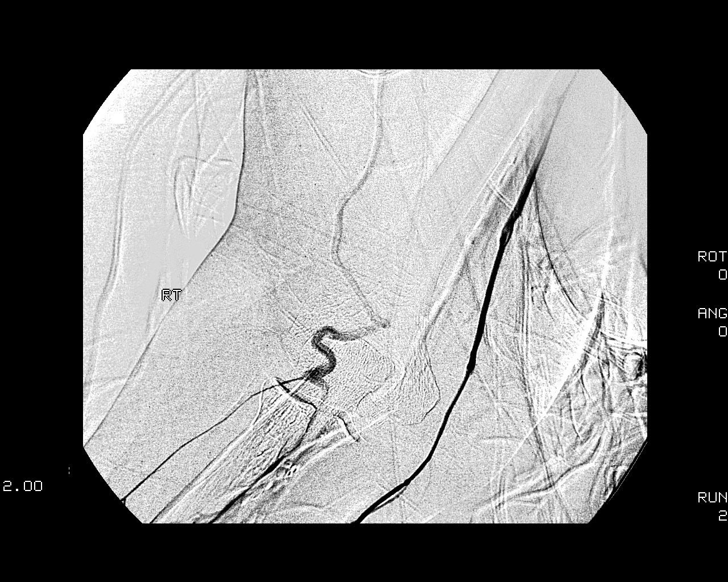
[im 28/71]
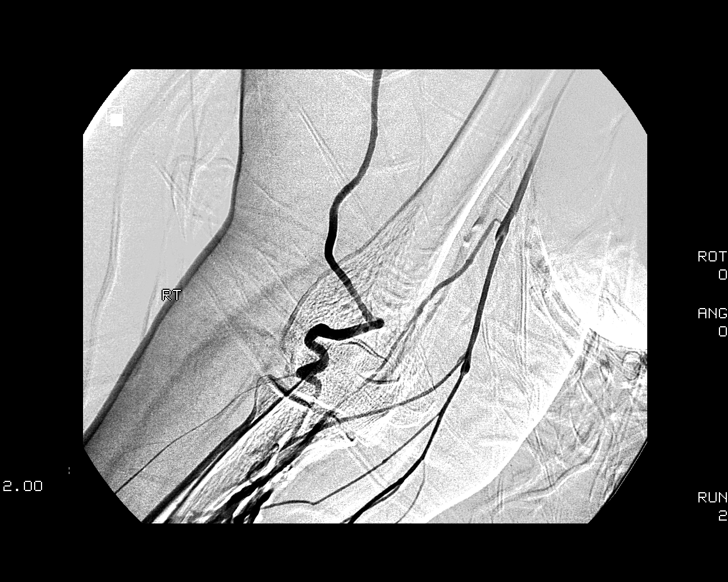
[im 34/71]
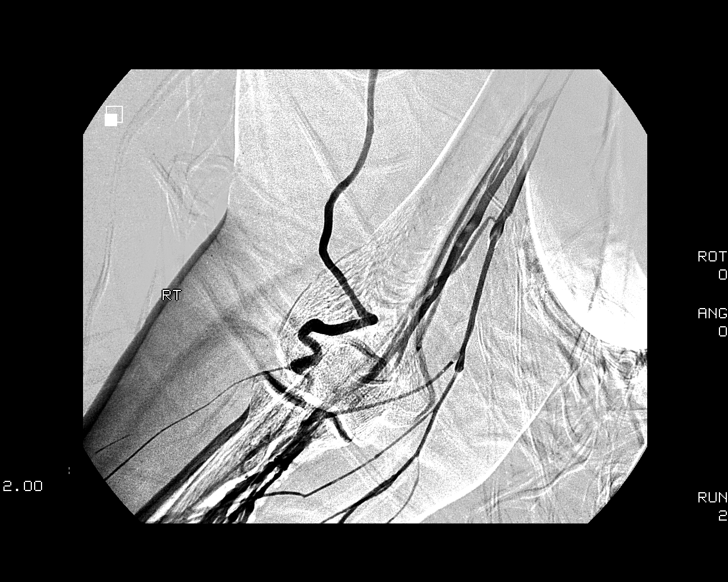
[im 40/71]
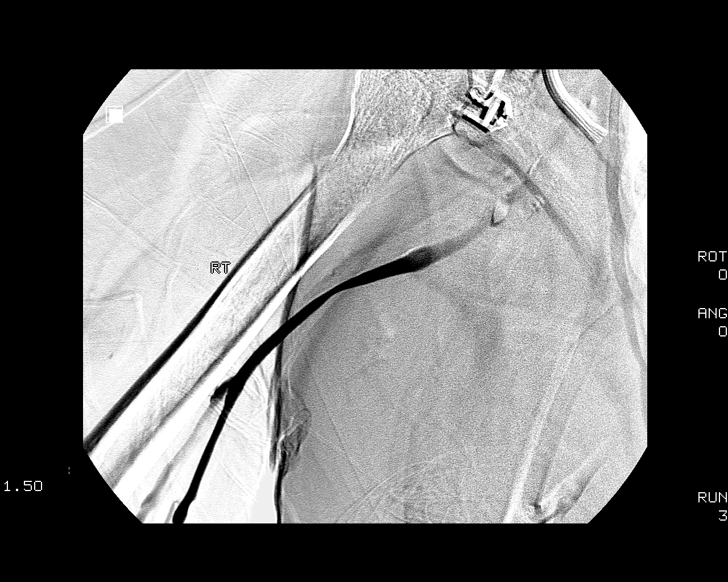
[im 46/71]
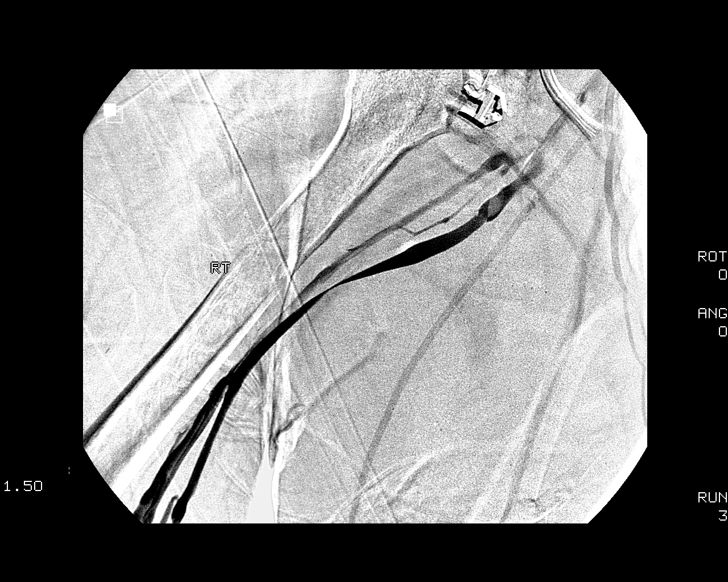
[im 52/71]
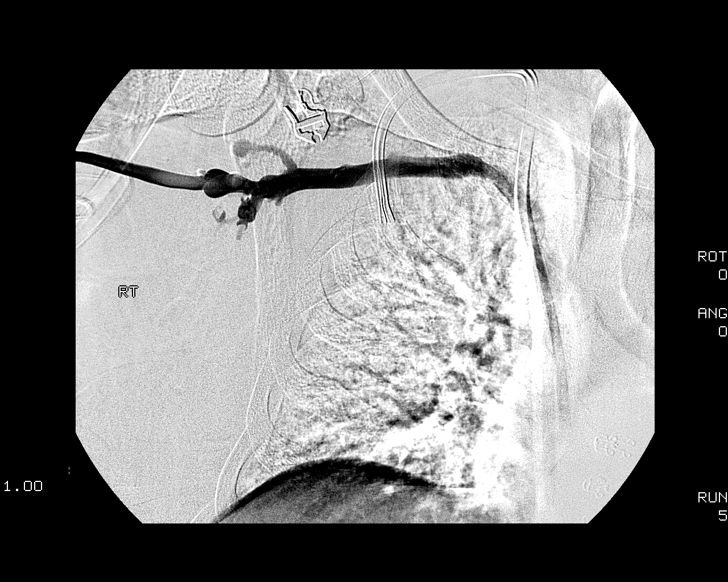
[im 58/71]
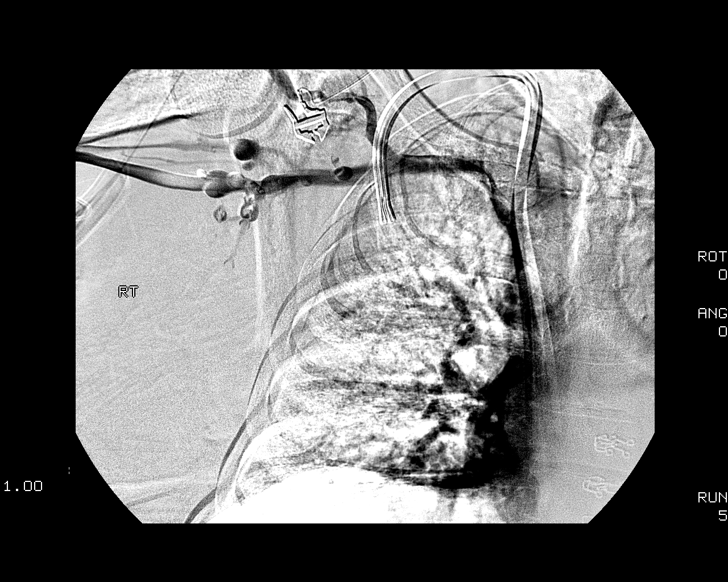
[im 64/71]
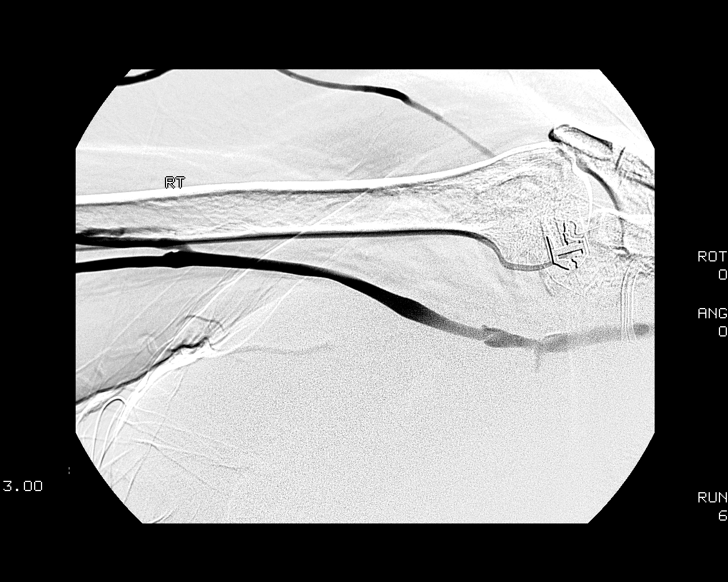
[im 71/71]
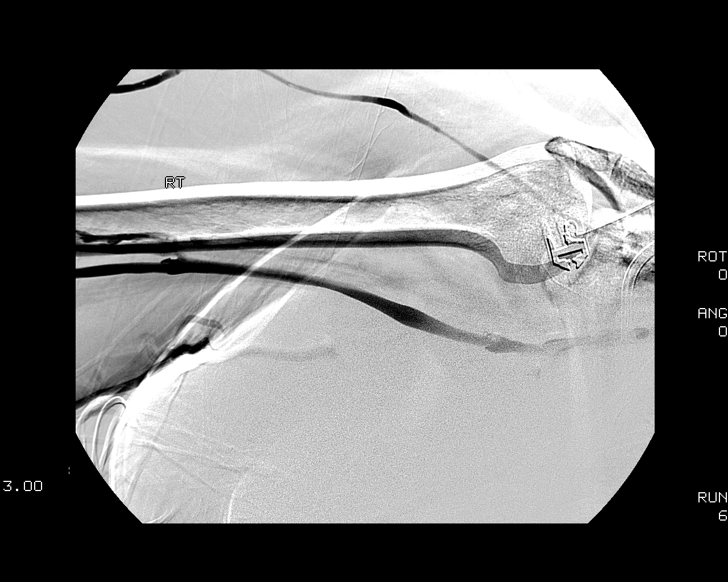

[12 of 24 positions shown; findings below may reference images not displayed]

IMPRESSION: No useable cephalic vein is identified in the right forearm.  The cephalic vein is visualized beginning at the antecubital fossa and is a small and tortuous vessel throughout the upper arm.  The deep veins of the upper arm are open and include paired venous drainage up to the point of the axillary vein.  No significant central venous stenosis is seen with suggestion of mild narrowing present of the brachiocephalic vein around the indwelling tunneled jugular catheter.

## 2009-06-03 ENCOUNTER — Ambulatory Visit: Payer: Self-pay | Admitting: Internal Medicine

## 2009-06-03 DIAGNOSIS — R0602 Shortness of breath: Secondary | ICD-10-CM | POA: Insufficient documentation

## 2009-06-10 ENCOUNTER — Ambulatory Visit (HOSPITAL_COMMUNITY): Admission: RE | Admit: 2009-06-10 | Discharge: 2009-06-10 | Payer: Self-pay | Admitting: Nephrology

## 2009-06-21 ENCOUNTER — Ambulatory Visit: Payer: Self-pay | Admitting: Internal Medicine

## 2009-06-27 ENCOUNTER — Ambulatory Visit (HOSPITAL_COMMUNITY): Admission: RE | Admit: 2009-06-27 | Discharge: 2009-06-27 | Payer: Self-pay | Admitting: Nephrology

## 2009-06-28 ENCOUNTER — Telehealth: Payer: Self-pay | Admitting: Internal Medicine

## 2009-06-29 ENCOUNTER — Ambulatory Visit (HOSPITAL_COMMUNITY): Admission: RE | Admit: 2009-06-29 | Discharge: 2009-06-29 | Payer: Self-pay | Admitting: Vascular Surgery

## 2009-06-29 ENCOUNTER — Ambulatory Visit: Payer: Self-pay | Admitting: Vascular Surgery

## 2009-07-11 ENCOUNTER — Telehealth: Payer: Self-pay | Admitting: Internal Medicine

## 2009-07-21 ENCOUNTER — Telehealth: Payer: Self-pay | Admitting: Internal Medicine

## 2009-07-28 ENCOUNTER — Telehealth: Payer: Self-pay | Admitting: Internal Medicine

## 2009-08-02 ENCOUNTER — Ambulatory Visit: Payer: Self-pay | Admitting: Vascular Surgery

## 2009-08-05 IMAGING — XA IR FLUORO GUIDE CV LINE*L*
1 series · 8 of 8 positions shown · IV contrast (omnipaque)
Comparison: none

CLINICAL DATA: Thrombosis of right upper arm dialysis graft. Plans were initially made to perform percutaneous declot procedure on the graft. However, due to dangerously high potassium, a request has been made to place a Vas-Cath for emergent hemodialysis. Just before the procedure potassium was 6.8.
 ULTRASOUND GUIDED VASCULAR ACCESS OF THE RIGHT EXTERNAL JUGULAR VEIN:
 RIGHT JUGULAR VENOGRAM:
 ATTEMPTED ACCESS OF LEFT EXTERNAL JUGULAR VEIN:
 ULTRASOUND GUIDED VASCULAR ACCESS OF LEFT COMMON FEMORAL VEIN:
 PLACEMENT OF NONTUNNELED CENTRAL VENOUS CATHETER VIA LEFT FEMORAL VEIN:
 Consent was obtained from the patient prior to the procedure. 
 Sedation: 6 mg IV Versed.
 Total amount of sedation time: 1 hr and 35 min.
 Contrast: 20 cc Omnipaque 300. 
 Fluoro Time: 12 min.
 Initially the right neck was sterilely prepped and draped. Local anesthesia was provided with 1% lidocaine. Ultrasound guided puncture of the right external jugular vein was performed with a 21-gauge needle. After securing guidewire access, a micropuncture dilator was advanced into the vein. Jugular venography was performed. 
 The course of the external jugular vein was then catheterized and a guidewire advanced through the superior vena cava and right atrium into the inferior vena cava. Venous access was dilated and attempt was made to advance an 11-French Vas-Cath over the wire. Catheter placement was eventually aborted and access removed. Manual compression was held over the right neck.
 Additional ultrasound was performed of the left neck. After sterile prep, a 21-gauge needle was advanced into the left external jugular vein. Guidewire advancement was performed under fluoroscopy. A small amount of contrast material was injected into the neck without performing a diagnostic venogram.
 The left groin was then sterilely prepped and draped. Ultrasound was used to confirm patency of the left common femoral vein. A 21-gauge needle was advanced into the vein with image documentation performed by ultrasound. After securing guidewire access, the venotomy was dilated to 12-French over a guidewire. A 20 cm, 11-French dual-lumen Vas-Cath was then advanced over the wire. Final catheter position was confirmed with a fluoroscopic spot image. The catheter was aspirated and flushed with saline and injected with a heparinized dwell. It was secured to the skin with Prolene retention sutures.

[Series 1: run · 8 of 8 slices shown]
[im 1/8]
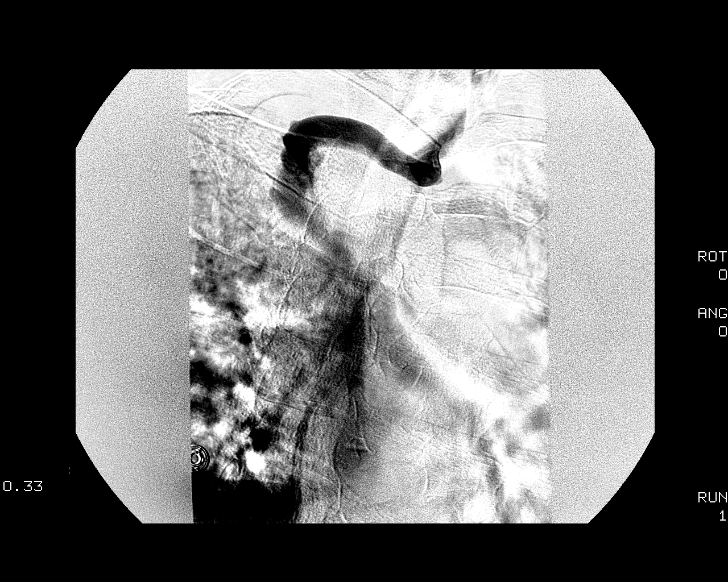
[im 2/8]
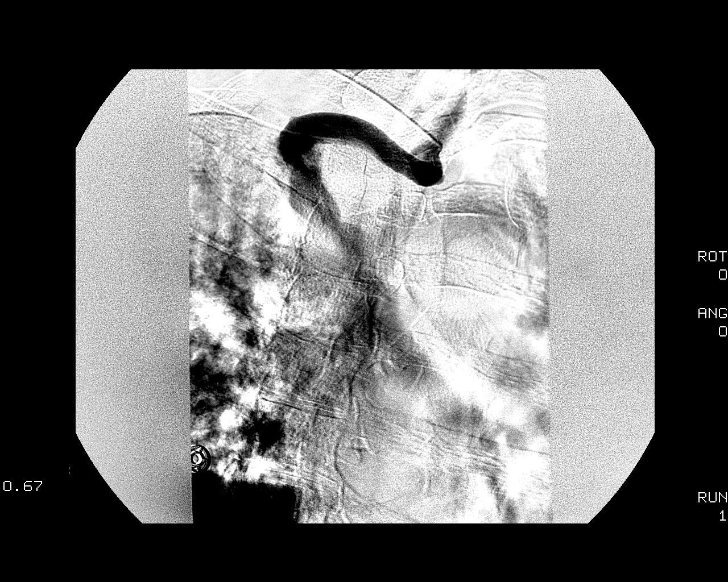
[im 3/8]
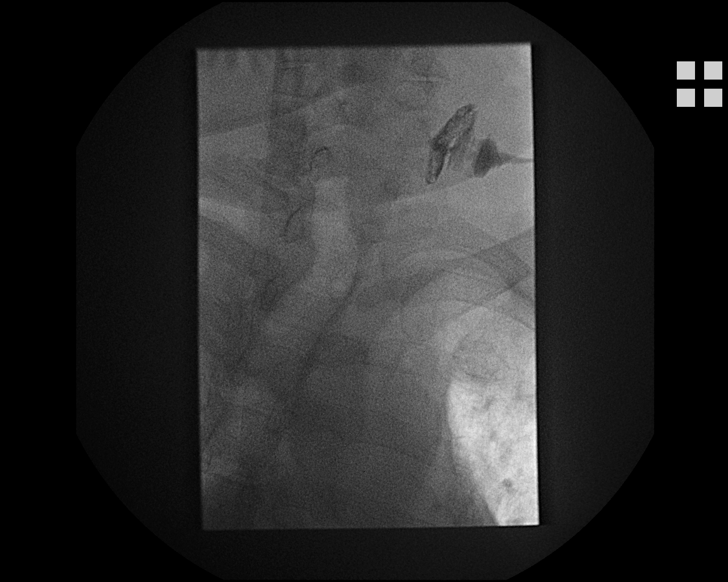
[im 4/8]
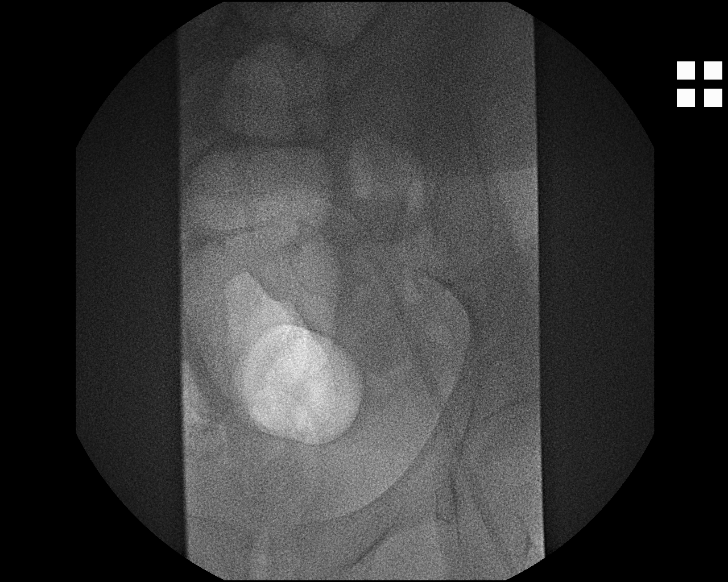
[im 5/8]
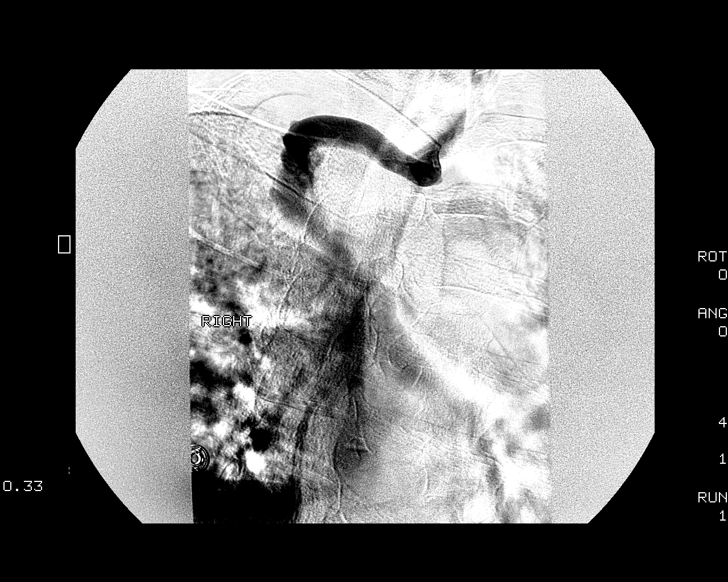
[im 6/8]
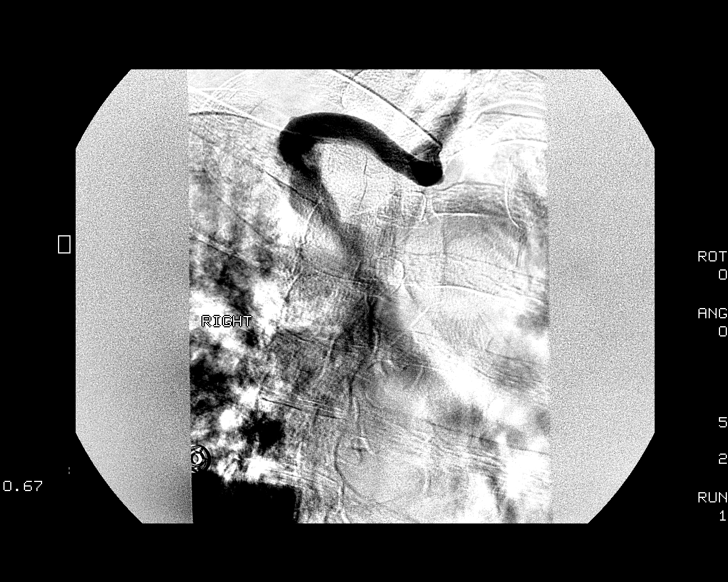
[im 7/8]
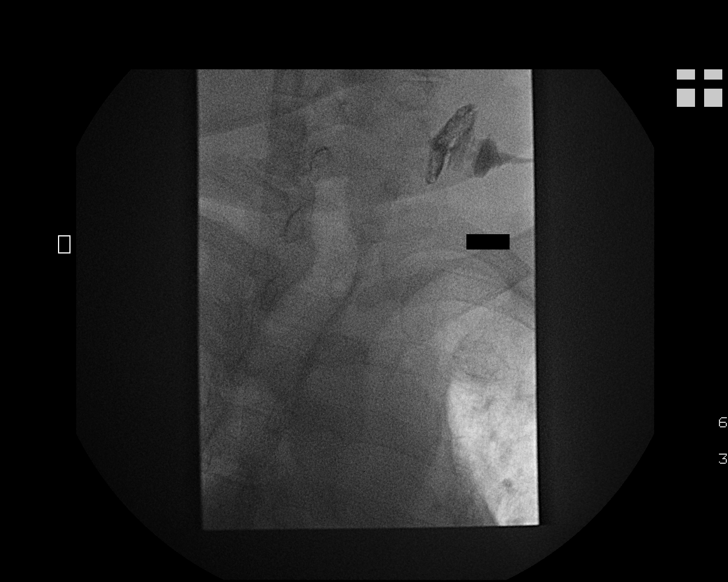
[im 8/8]
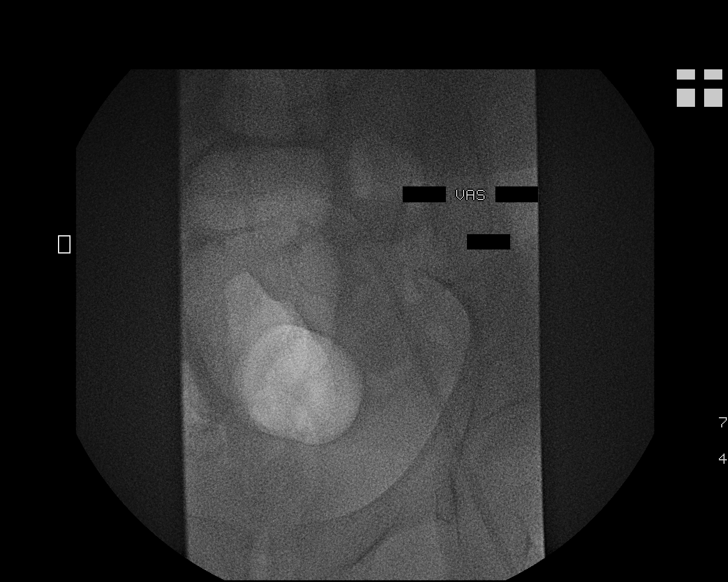

[8 of 8 positions shown; findings below may reference images not displayed]

FINDINGS: In the right neck, initial ultrasound demonstrated occlusion of the right internal jugular vein. An enlarged external jugular vein was present; however, and was able to be initially punctured. Venogram performed through a catheter demonstrates tortuous course of the vein into the chest where it ultimately drains into the brachiocephalic vein with patency also demonstrated of the superior vena cava. 
 Despite being able to catheterize the right external jugular vein and straighten its course with a stiff guidewire, the Vas-Cath could not be advanced around tortuous loops into the superior vena cava despite multiple attempts with different guidewires. Attempt at right external jugular catheter placement was therefore aborted. 
 A small left external jugular vein was present under ultrasound with chronic occlusion of the left internal jugular vein.   After puncturing this small vessel, a guidewire would only ascend superiorly in the neck and would not descend. Contrast injected showed no opacification of a usable vessel for catheter placement. 
 Ultimately, a Vas-Cath was successfully placed via the left common femoral vein. The catheter tip lies in the expected position of the left common iliac vein.  This catheter is ready for immediate use and the patient is scheduled to be dialyzed emergently at [HOSPITAL] after catheter placement. 
 During the procedure, Dr. Nishat did come to speak to the patient regarding need for placement of a femoral catheter, which she initially refused. Due to emergent need for dialysis, the patient did eventually consent to have this catheter placed. Attempt at percutaneous declot procedure of the occluded right upper extremity graft has been scheduled for 08/07/07.
IMPRESSION: 1.  Inability to place a temporary dialysis catheter via both right and left external jugular veins. Attempt at catheter placement did include a formal venogram of the right external jugular vein. Both internal jugular veins are occluded in the neck. 
 2.  Placement of 11-French by 20 cm dual-lumen Vas-Cath via left common femoral vein. The catheter lies in the common iliac vein. Attempt at percutaneous declot procedure of the occluded right upper arm graft has been scheduled for 08/07/07. The patient was transferred immediately to the [HOSPITAL] Hemodialysis Unit after catheter placement today for emergent hemodialysis.

## 2009-08-06 IMAGING — XA IR AV DIALYSIS ACCESS DECLOT
1 series · 12 of 24 positions shown · non-contrast
Comparison: none

CLINICAL DATA: Declot right arm graft.
DIALYSIS GRAFT DECLOT:
PTA:
ULTRASOUND GUIDANCE FOR VASCULAR ACCESS:
OCCLUSION OF THE DIALYSIS GRAFT:
Medications:   Versed 9 mg, fentanyl 200 mcg, heparin 1333 U, TPA 2 mg, protamine 20 mg. 
Procedure: Informed consent was obtained. The patient was placed supine on the interventional table. The patient was found to have an occluded straight graft in the right upper arm. The upper arm was prepped and draped in a sterile fashion. The skin was anesthetized with 1% lidocaine. 21-gauge needles were directed into the graft pointing towards the arterial and venous anastomosis and micropuncture dilator sets were placed using ultrasound guidance. Access pointing towards the central venous structures was up-sized with a 7-French vascular sheath. A catheter was advanced into the central venous structures demonstrating patency of the central venous structures, although there was stenosis of the right subclavian vein at the junction of the subclavian vein and the innominate.  signnificant collateral vessels were identified.  Subsequently, the graft was treated with the PTD thrombectomy device.   Please note that TPA was infused into the graft prior to the central venogram. The venous anastomosis was angioplastied using a 6 mm x 40 mm Conquest balloon. The patient was found to have a very severe stenosis just beyond the venous anastomosis. The access pointing towards the arterial anastomosis was then up-sized to a 6-French vascular sheath and a Fogarty balloon was pulled across the arterial anastomosis two times. Following removal of the arterial plaque, there was flow throughout the graft, but there was some residual clot. As a result, the graft was retreated with the Uebel device.  Followup venogram demonstrated extravasation of contrast at the venous anastomosis with a hematoma formation. Immediately, the 7 mm balloon was inflated along the graft near the venous anastomosis to occlude flow.  A second 6 mm balloon was then inflated along the arterial limb of the graft. The patient also received 20 mg of Protamine in order to reverse the heparin. Approximately 30 minutes later, the balloon at the venous anastomosis was taken down and exchanged fora Kumpe catheter and a Glidewire. The central venous structures could never be recannulated.  As a result, the balloon was slightly pulled back to the mid graft and the other sheath was removed. The balloon was left inflated overnight and secured in place. There was no evidence for flow in the graft at the end of the procedure.

[Series 1: run · 12 of 25 slices shown]
[im 2/25]
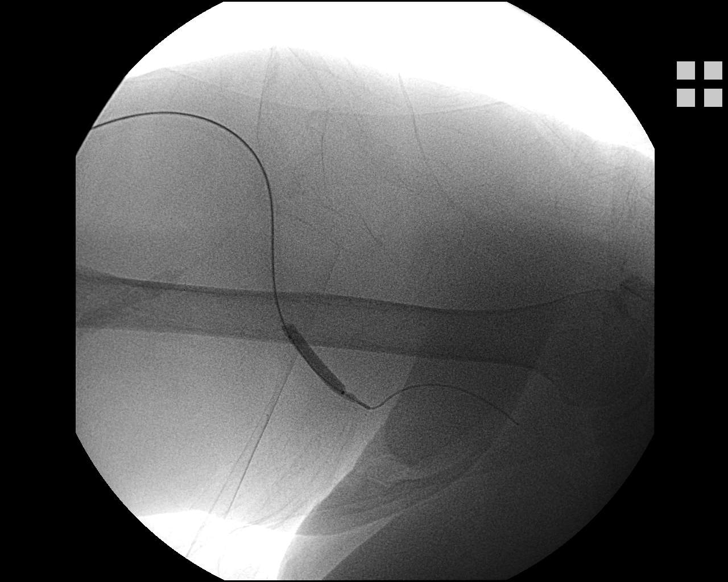
[im 4/25]
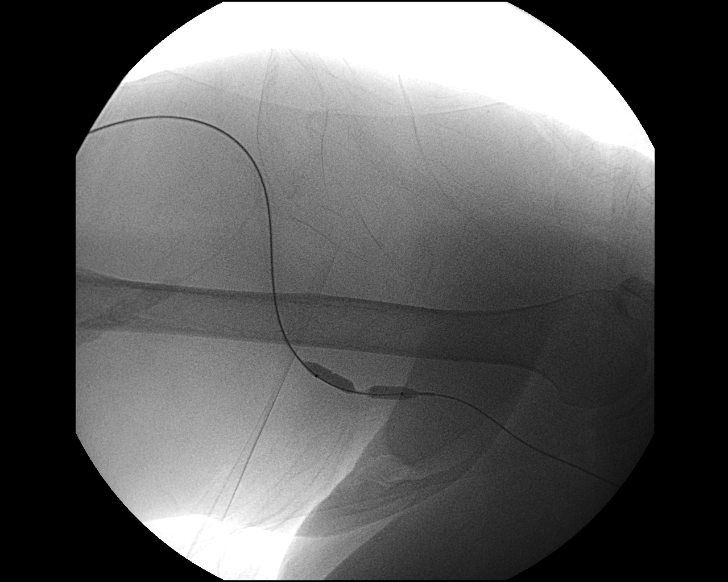
[im 6/25]
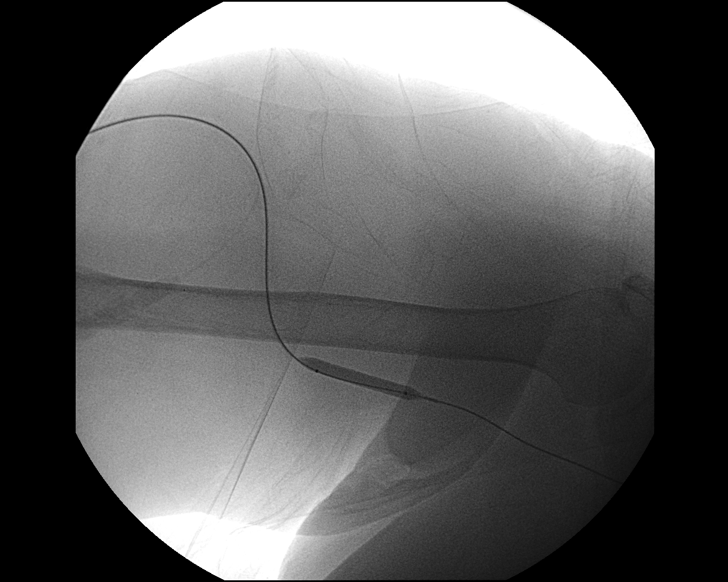
[im 8/25]
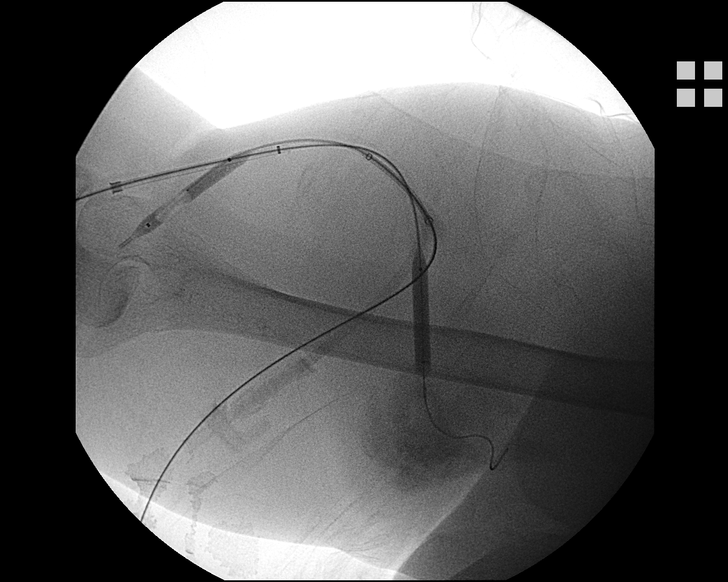
[im 10/25]
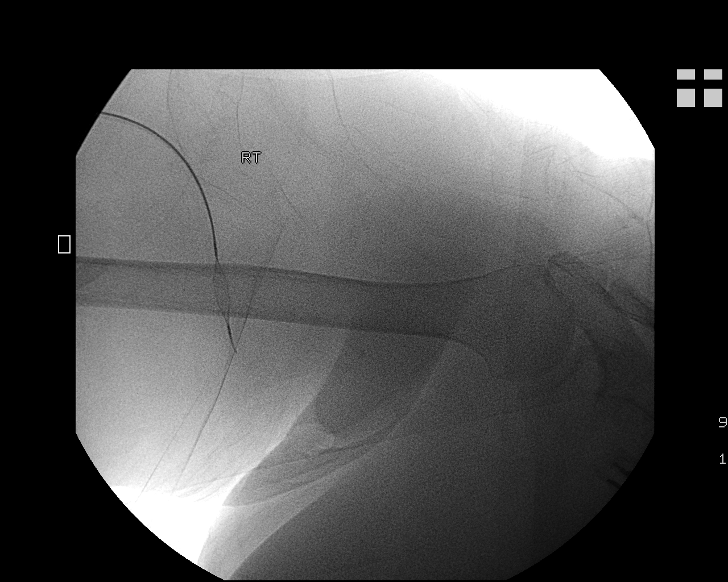
[im 12/25]
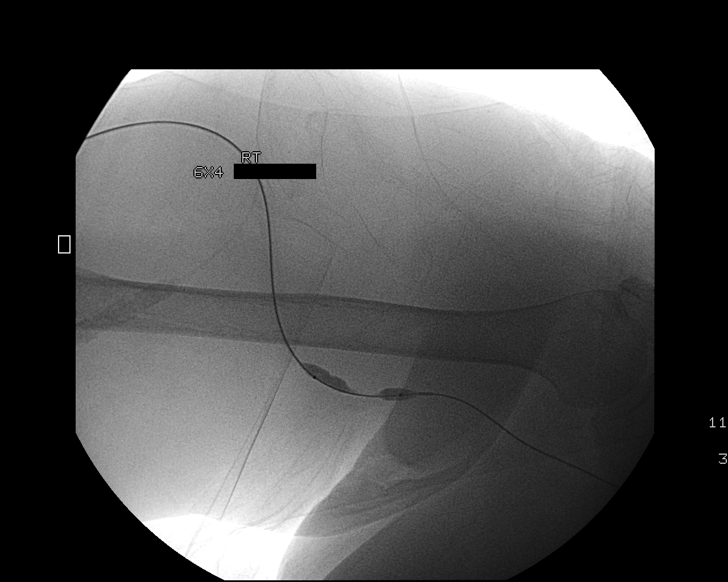
[im 14/25]
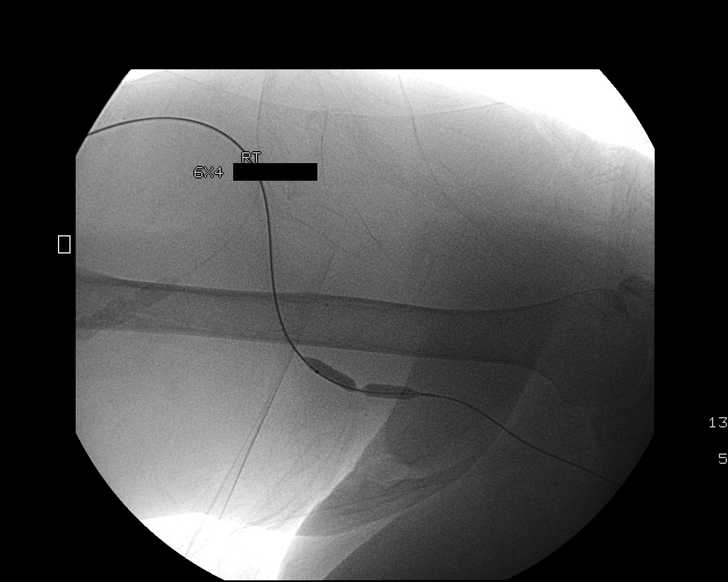
[im 16/25]
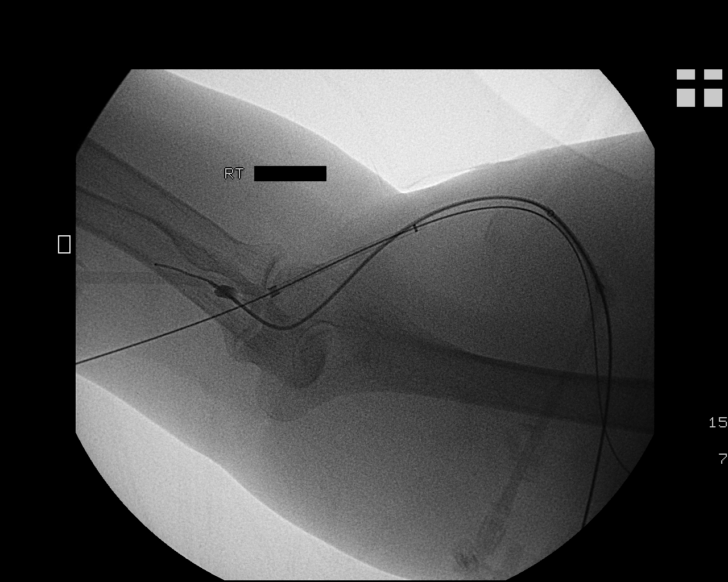
[im 18/25]
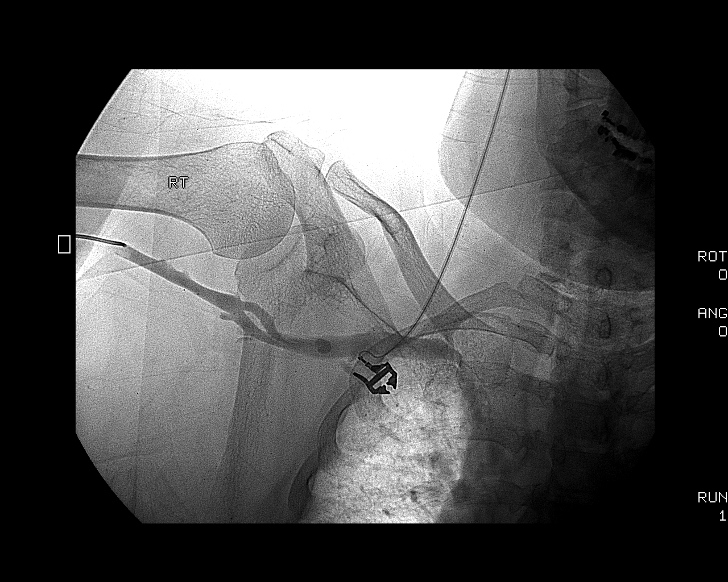
[im 20/25]
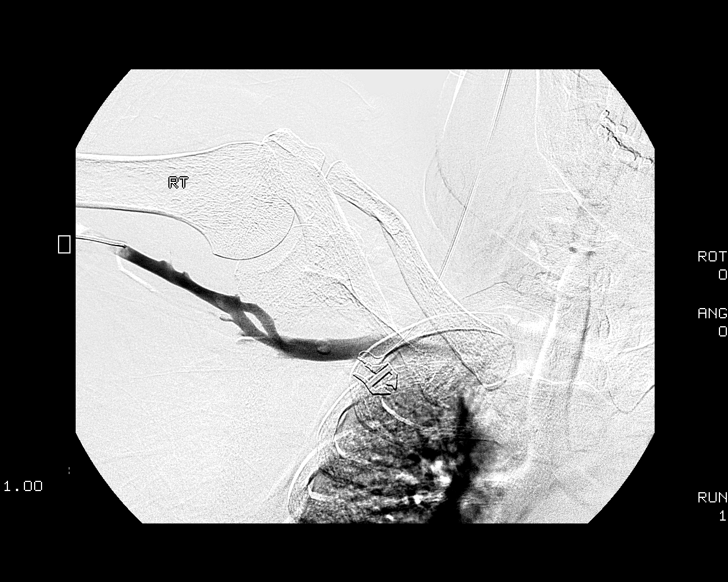
[im 22/25]
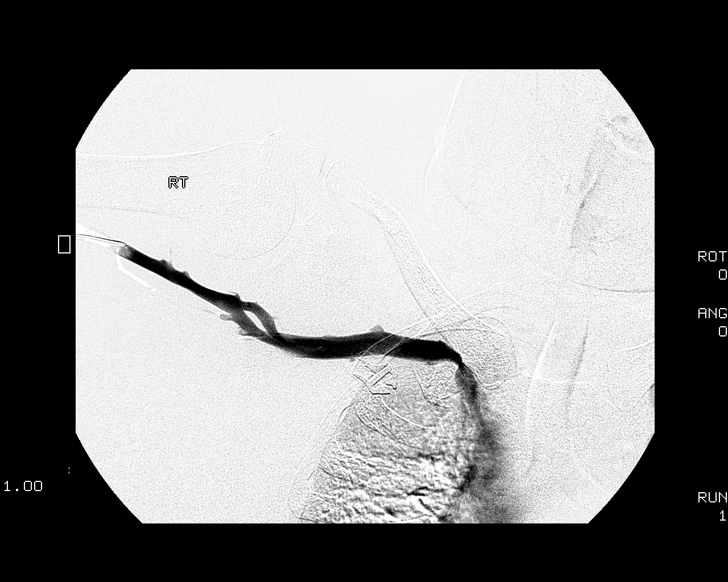
[im 25/25]
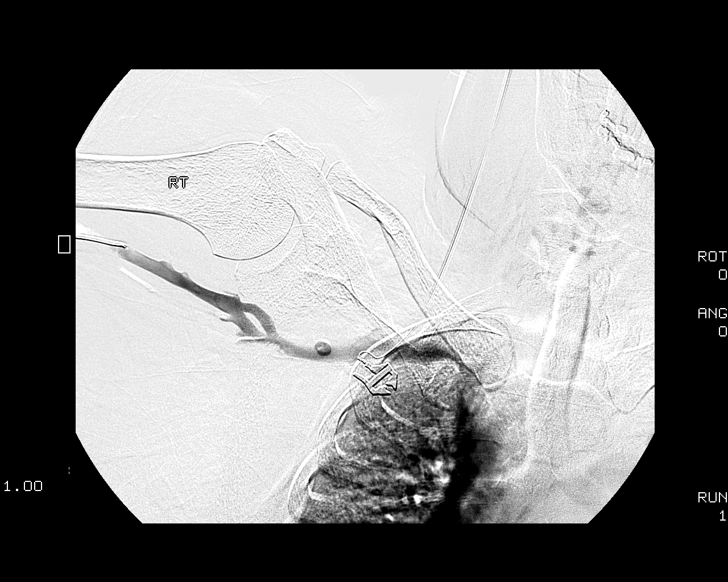

[12 of 24 positions shown; findings below may reference images not displayed]

IMPRESSION: Attempted declot of the right upper arm graft was complicated by rupture at the venous anastomosis. The vein could not be repaired, and therefore, the graft was occluded using angioplasty balloons. A balloon was left in place overnight, but there was no obvious flow to the graft at the end of the procedure. In addition, the patient had no significant hematoma formation in the right arm. 
Plan: The patient will return in approximately 24 hours for removal of the balloon and attempt to place a right subclavian dialysis catheter.

## 2009-08-06 IMAGING — US IR AV DIALYSIS ACCESS DECLOT
1 series · 3 of 3 positions shown · non-contrast
Comparison: none

CLINICAL DATA: Declot right arm graft.
DIALYSIS GRAFT DECLOT:
PTA:
ULTRASOUND GUIDANCE FOR VASCULAR ACCESS:
OCCLUSION OF THE DIALYSIS GRAFT:
Medications:   Versed 9 mg, fentanyl 200 mcg, heparin 1333 U, TPA 2 mg, protamine 20 mg. 
Procedure: Informed consent was obtained. The patient was placed supine on the interventional table. The patient was found to have an occluded straight graft in the right upper arm. The upper arm was prepped and draped in a sterile fashion. The skin was anesthetized with 1% lidocaine. 21-gauge needles were directed into the graft pointing towards the arterial and venous anastomosis and micropuncture dilator sets were placed using ultrasound guidance. Access pointing towards the central venous structures was up-sized with a 7-French vascular sheath. A catheter was advanced into the central venous structures demonstrating patency of the central venous structures, although there was stenosis of the right subclavian vein at the junction of the subclavian vein and the innominate.  signnificant collateral vessels were identified.  Subsequently, the graft was treated with the PTD thrombectomy device.   Please note that TPA was infused into the graft prior to the central venogram. The venous anastomosis was angioplastied using a 6 mm x 40 mm Conquest balloon. The patient was found to have a very severe stenosis just beyond the venous anastomosis. The access pointing towards the arterial anastomosis was then up-sized to a 6-French vascular sheath and a Fogarty balloon was pulled across the arterial anastomosis two times. Following removal of the arterial plaque, there was flow throughout the graft, but there was some residual clot. As a result, the graft was retreated with the Uebel device.  Followup venogram demonstrated extravasation of contrast at the venous anastomosis with a hematoma formation. Immediately, the 7 mm balloon was inflated along the graft near the venous anastomosis to occlude flow.  A second 6 mm balloon was then inflated along the arterial limb of the graft. The patient also received 20 mg of Protamine in order to reverse the heparin. Approximately 30 minutes later, the balloon at the venous anastomosis was taken down and exchanged fora Kumpe catheter and a Glidewire. The central venous structures could never be recannulated.  As a result, the balloon was slightly pulled back to the mid graft and the other sheath was removed. The balloon was left inflated overnight and secured in place. There was no evidence for flow in the graft at the end of the procedure.

[Series 1: ir av dialysis access declot · 3 of 3 slices shown]
[im 1/3]
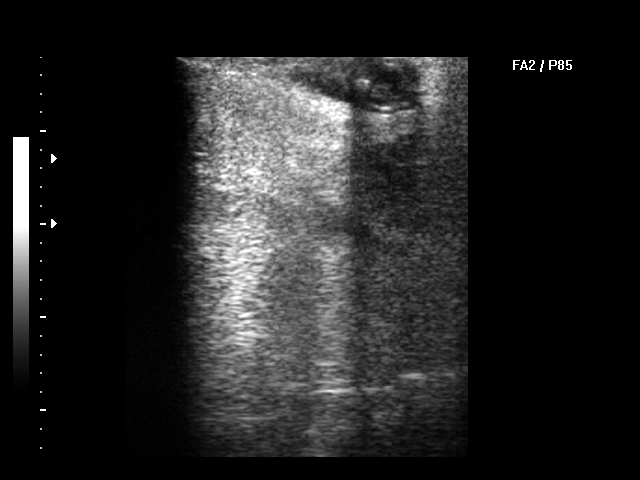
[im 2/3]
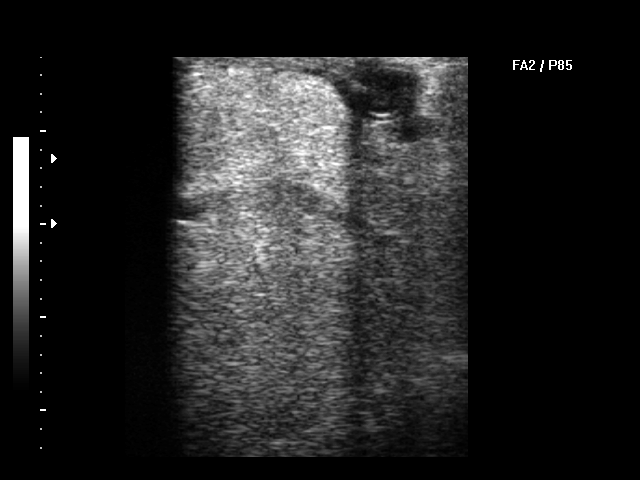
[im 3/3]
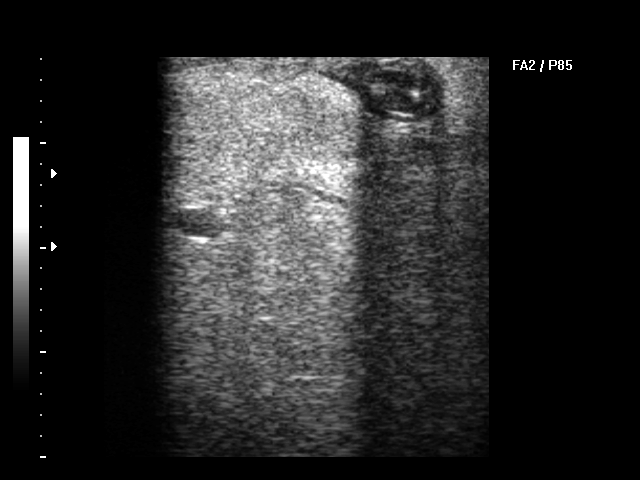

[3 of 3 positions shown; findings below may reference images not displayed]

IMPRESSION: Attempted declot of the right upper arm graft was complicated by rupture at the venous anastomosis. The vein could not be repaired, and therefore, the graft was occluded using angioplasty balloons. A balloon was left in place overnight, but there was no obvious flow to the graft at the end of the procedure. In addition, the patient had no significant hematoma formation in the right arm. 
Plan: The patient will return in approximately 24 hours for removal of the balloon and attempt to place a right subclavian dialysis catheter.

## 2009-08-07 IMAGING — XA IR FLUORO GUIDE CV LINE*R*
1 series · 7 of 7 positions shown · non-contrast
Comparison: none

CLINICAL DATA: Clotted right upper arm graft, which is not salvageable. 
PLACEMENT OF A TUNNELED DIALYSIS CATHETER:
REMOVAL OF THE GROIN CATHETER:
Medications: Versed 8 mg, fentanyl 175 mcg.
Procedure: The patient did give informed consent for this procedure and understood the risks for the procedure. The patient was placed supine on the interventional table. Ultrasound demonstrated a patent right subclavian and axillary vein. The right chest was prepped and draped in a sterile fashion. The skin was anesthetized with 1% lidocaine. A 21-gauge needle was directed into the vein at the junction of the subclavian and axillary vein. Dark blood was aspirated. A wire was advanced into the SVC and a micropuncture dilator set was placed. Contrast injection was used to confirm placement in the central venous structures. A small incision was made a few centimeters below this puncture site for a tunnel. A 23-cm tip-to-cuff catheter was advanced through the tunnel to the vein dermatotomy site. The access was up-sized to accommodate the large peel-away sheath and a 23 cm HemoSplit was placed through the peel-away sheath. The catheter initially went into what appeared to be the left innominate vein. As a result, this catheter needed to be pulled back and repositioned into the SVC using two Stiff Amplatz wires. A catheter was eventually placed in the proximal right atrium and both lumens were found to aspirate and flush very well. The catheter was secured to the skin using suture and the vein dermatotomy site was closed using absorbable suture and Dermabond. The catheter was flushed with an appropriate amount of heparin. 
The patient?s existing groin Vas-Cath was removed with manual compression.

[Series 1: run · 7 of 7 slices shown]
[im 1/7]
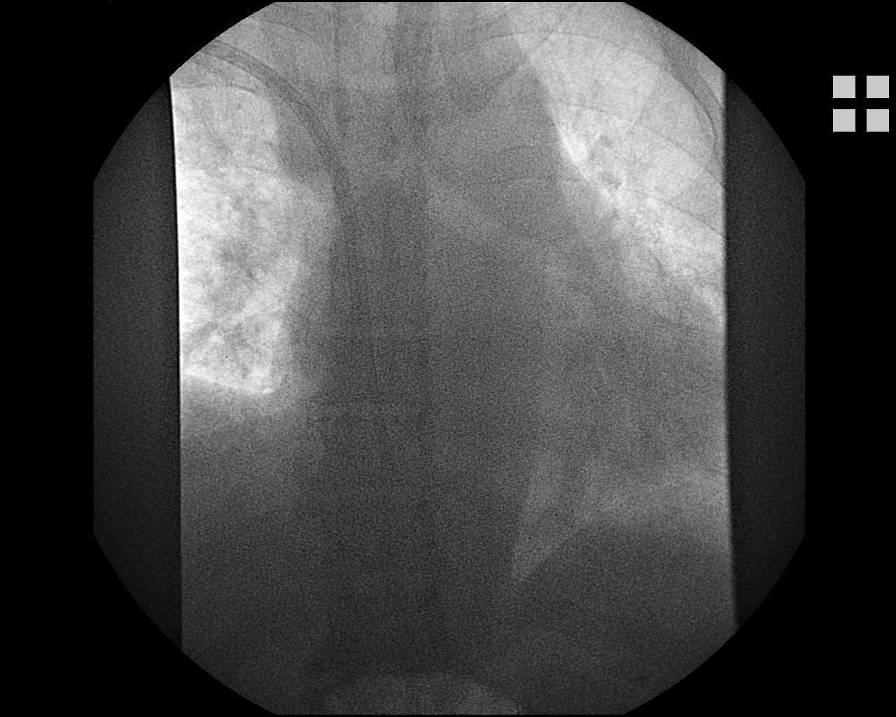
[im 2/7]
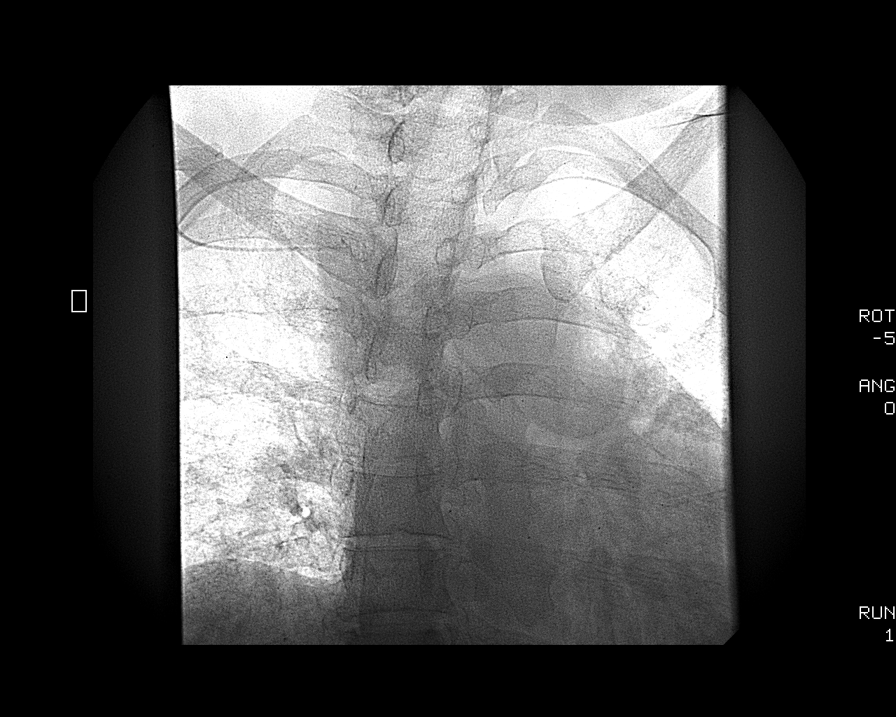
[im 3/7]
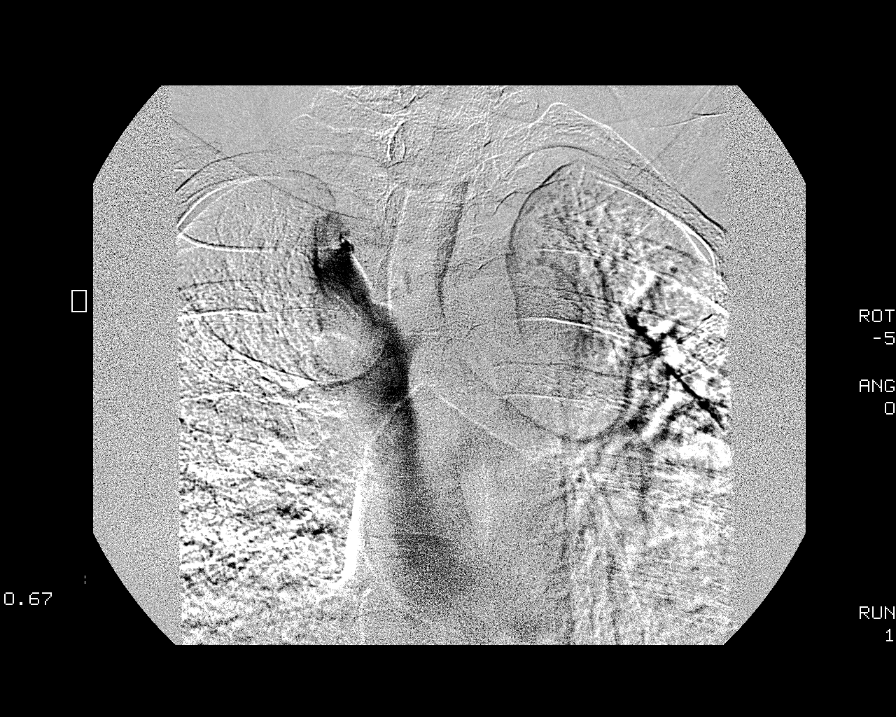
[im 4/7]
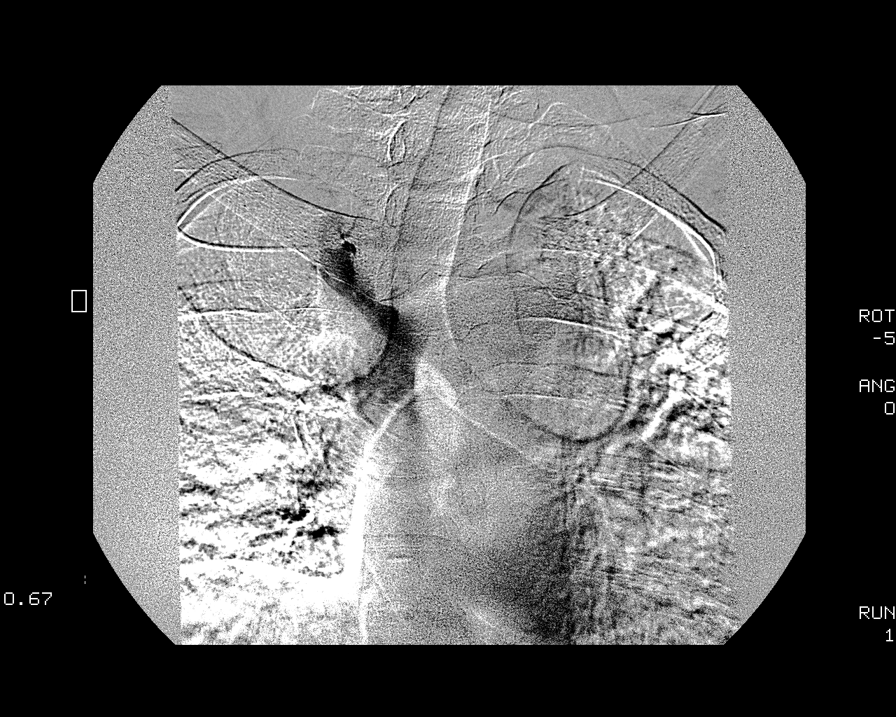
[im 5/7]
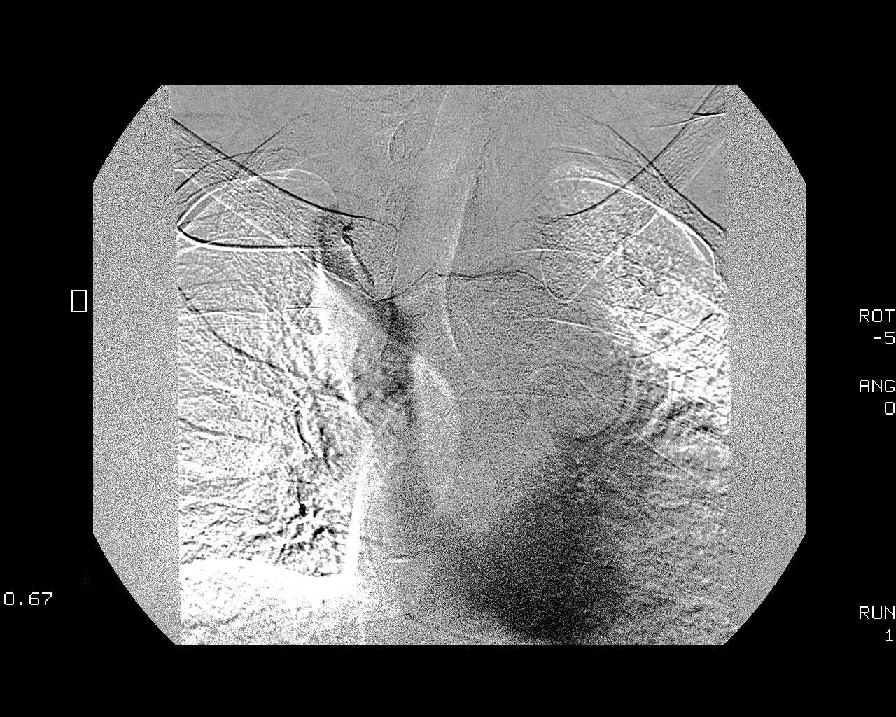
[im 6/7]
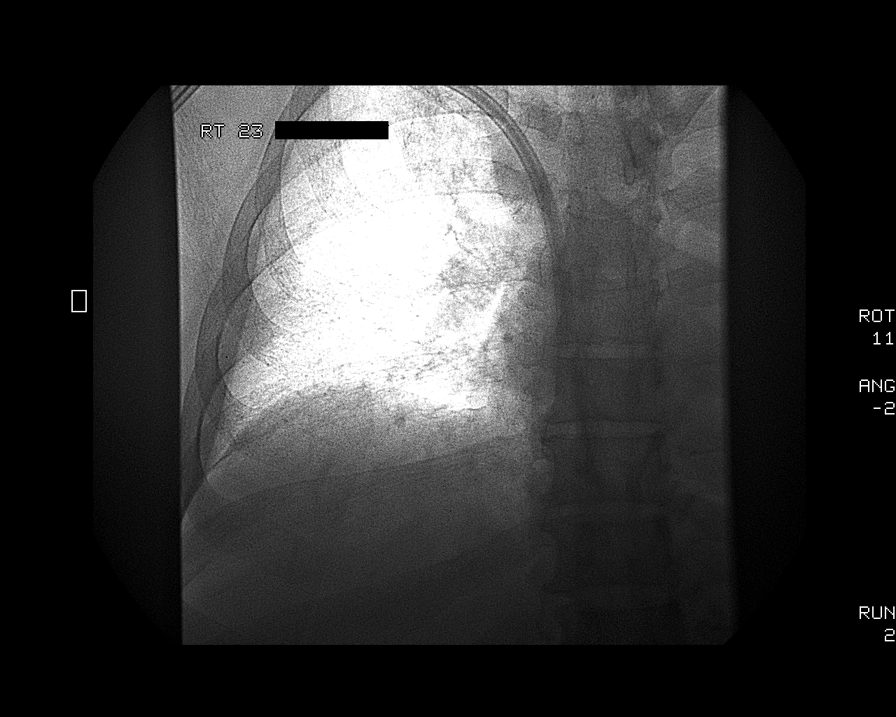
[im 7/7]
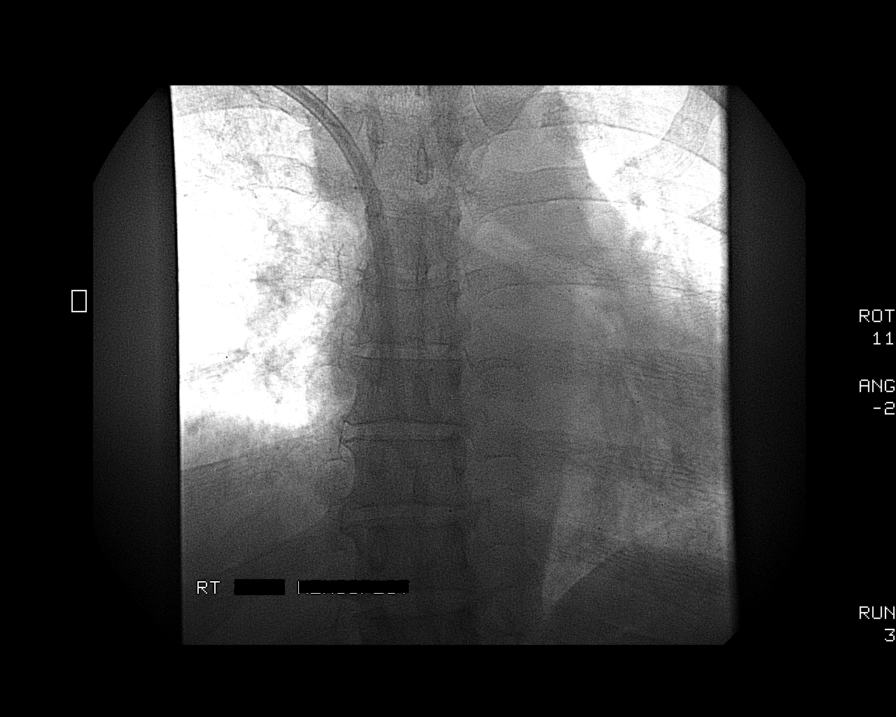

[7 of 7 positions shown; findings below may reference images not displayed]

IMPRESSION: 1.  Successful placement of a tunneled right chest catheter through the right subclavian vein. The catheter is in the proximal right atrium and ready to be used. 
2.  Removal of the groin Vas-Cath
3.  The patient had a sheath and balloon in the right arm graft in order to facilitate thrombosis. Ultrasound confirmed that the graft was occluded. The sheath and balloon were removed without complication. No evidence for bleeding from the puncture site.

## 2009-08-09 ENCOUNTER — Telehealth: Payer: Self-pay | Admitting: Internal Medicine

## 2009-08-16 ENCOUNTER — Encounter: Payer: Self-pay | Admitting: Internal Medicine

## 2009-08-26 ENCOUNTER — Emergency Department (HOSPITAL_COMMUNITY): Admission: EM | Admit: 2009-08-26 | Discharge: 2009-08-26 | Payer: Self-pay | Admitting: Emergency Medicine

## 2009-08-29 ENCOUNTER — Encounter: Payer: Self-pay | Admitting: Internal Medicine

## 2009-09-02 ENCOUNTER — Telehealth: Payer: Self-pay | Admitting: Internal Medicine

## 2009-09-06 ENCOUNTER — Ambulatory Visit: Payer: Self-pay | Admitting: Internal Medicine

## 2009-09-06 DIAGNOSIS — J984 Other disorders of lung: Secondary | ICD-10-CM

## 2009-09-08 ENCOUNTER — Encounter: Admission: RE | Admit: 2009-09-08 | Discharge: 2009-09-08 | Payer: Self-pay | Admitting: Internal Medicine

## 2009-09-09 ENCOUNTER — Emergency Department (HOSPITAL_COMMUNITY): Admission: EM | Admit: 2009-09-09 | Discharge: 2009-09-10 | Payer: Self-pay | Admitting: Emergency Medicine

## 2009-09-12 IMAGING — CT CT ABDOMEN W/O CM
2 of 4 series · 16 of 46 positions shown, 18 images · IV contrast (READICAT)
Comparison: CT abdomen 11/23/05 and 11/09/05.

CLINICAL DATA: 51 year-old-female with abdominal pain. 
ABDOMEN CT WITHOUT CONTRAST:
TECHNIQUE: Multidetector CT imaging of the abdomen was performed following the standard protocol without IV contrast.
TECHNIQUE: Multidetector CT imaging of the pelvis was performed following the standard protocol without IV contrast.

[Series 2: routine abdomen · axial · 0.98mm/px · z∈[-478,-54]mm · 13 of 148 slices shown, 15 images]
[im 7/148  soft-tissue]
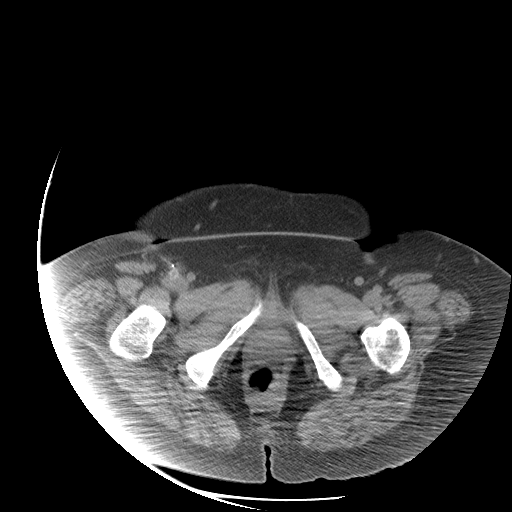
[im 7/148  bone]
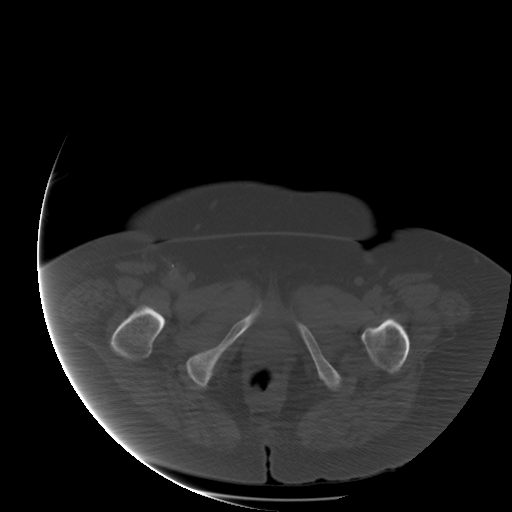
[im 19/148  soft-tissue]
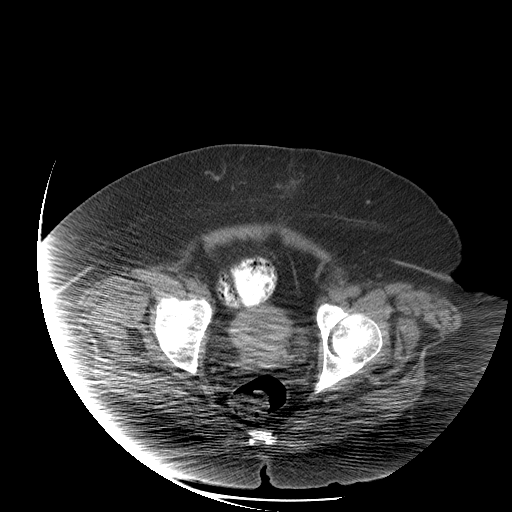
[im 31/148  soft-tissue]
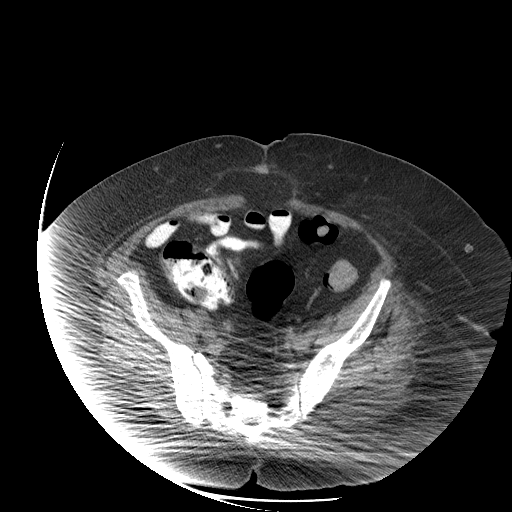
[im 43/148  soft-tissue]
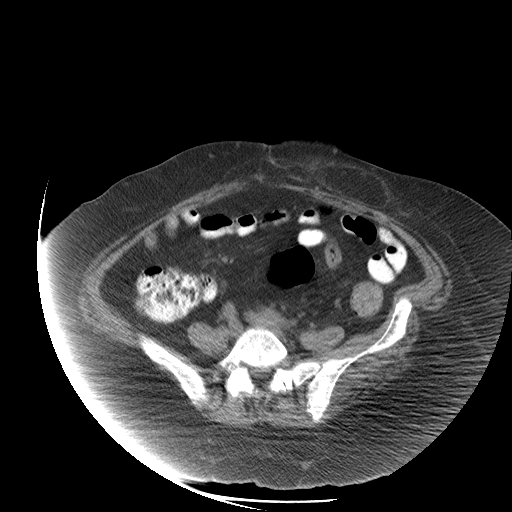
[im 50/148  soft-tissue]
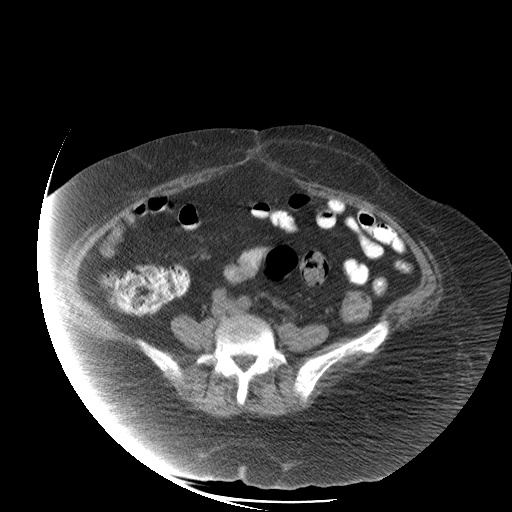
[im 62/148  soft-tissue]
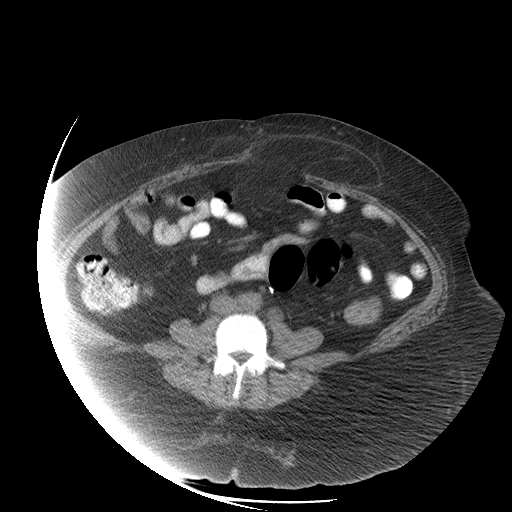
[im 74/148  soft-tissue]
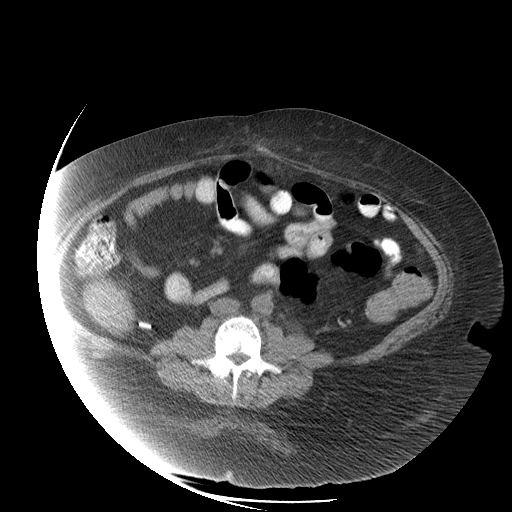
[im 86/148  soft-tissue]
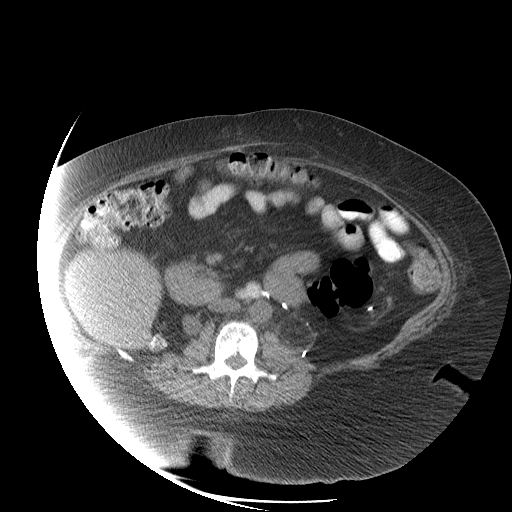
[im 99/148  soft-tissue]
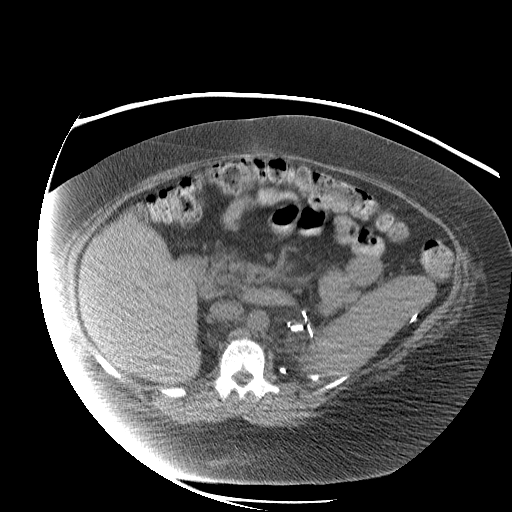
[im 99/148  bone]
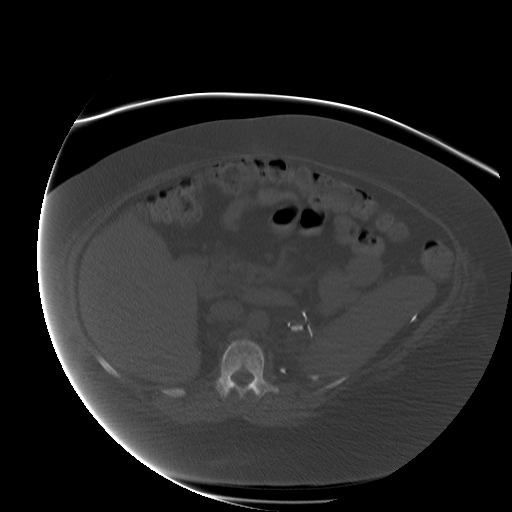
[im 105/148  soft-tissue]
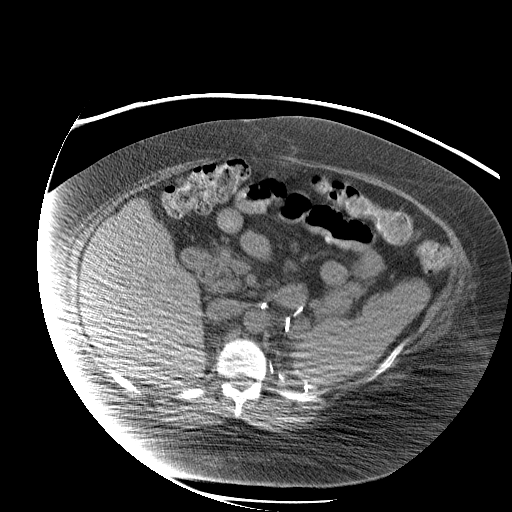
[im 117/148  soft-tissue]
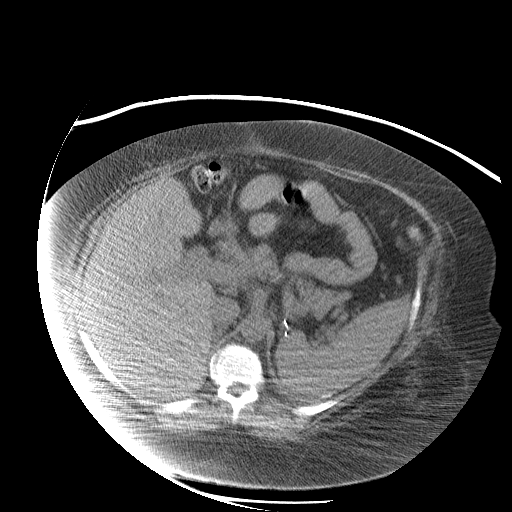
[im 129/148  soft-tissue]
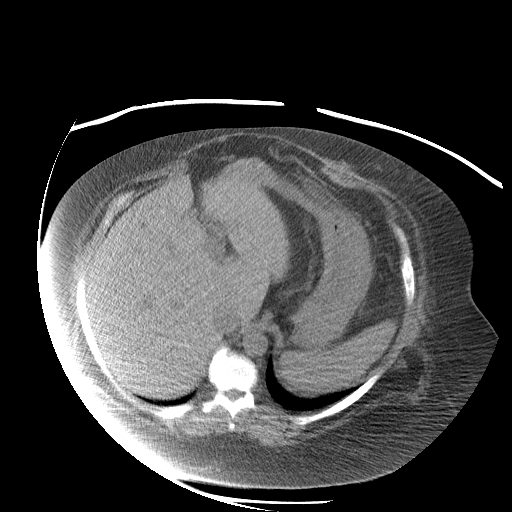
[im 141/148  soft-tissue]
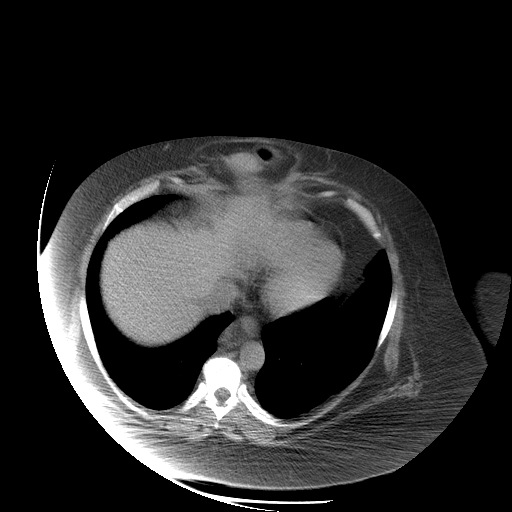

[Series 401: cor abd/pel · coronal · 0.98mm/px · 3 of 163 slices shown]
[im 55/163  soft-tissue]
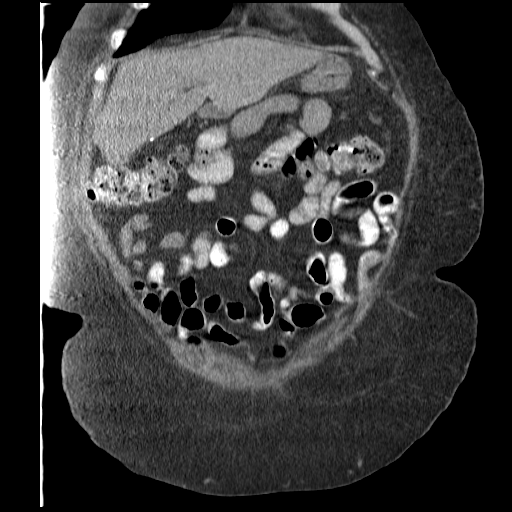
[im 73/163  soft-tissue]
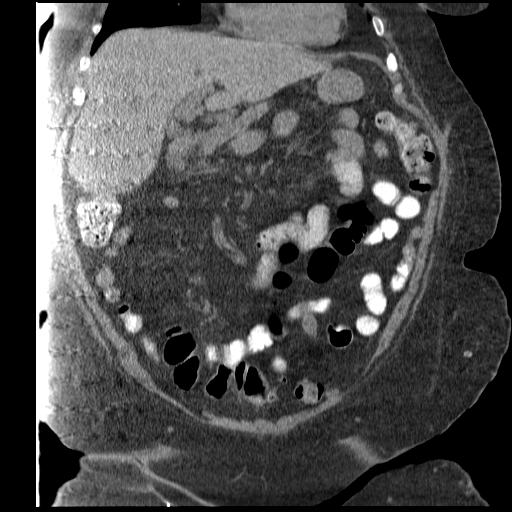
[im 91/163  soft-tissue]
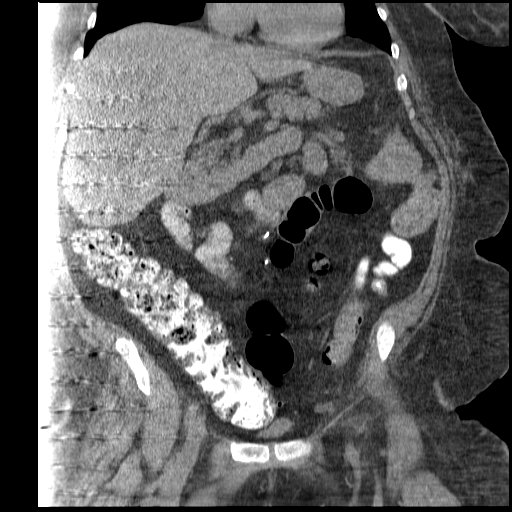

[16 of 46 positions shown; findings below may reference images not displayed]

FINDINGS: The study is somewhat limited by lack of IV contrast and patient?s large body habitus.  There are no kidneys present so IV contrast could be considered in the future. 
The lung bases are clear. The liver appears normal on noncontrast exam. There are cholecystectomy clips in the gallbladder fossa.  There are 3 low-density lesions within the left hepatic lobe which are unchanged from prior and less than 1 cm and too small to characterize.  
The pancreas and spleen appear normal.  There are multiple surgical clips in the left renal fossa at the site of prior fatty tumor resection.  There are still fatty elements remaining in the tumor bed.    In the superior aspect of the tumor bed adjacent to the medial border of the spleen, there is a 13 x 45 mm soft tissue thickening (image 28).  The left adrenal gland is not clearly visualized.  Within the right renal fossa, there is a 22 x 18 mm soft tissue density, rounded, which has increased in size from 14 x 15 mm on prior.  This is seen on image 38.  Just lateral to this, there is a calcified rounded density adjacent to the inferior right hepatic lobe measuring 16 mm which has also increased from 11 mm on prior. 
There is no evidence of bowel obstruction.   There is a large ventral hernia extending from the inferior costal margin which contains a portion of the anterior wall of the stomach and a small loop of small bowel.  These are non-strangulating.  More inferior, there is a 2nd ventral hernia in the mid abdomen with a 6 cm neck which is fat-filled.  Normal appendix.
IMPRESSION: 1. No evidence of acute process to explain abdominal pain.
2.  Ventral hernias without evidence of bowel obstruction. 
3.  Interval increase in size of small 2 cm retroperitoneal mass in the right renal fossa.    However cannot exclude neoplasm such as Renal cell carcinoma.  Correlate with pathology of excised right kidney.  
4.  Soft tissue thickening in the superior aspect of the left renal resection bed.  This could be residual renal tissue vs tumor.  
5.  Small hepatic hypodensities are likey angiomyolipomas in patient with reported tuberous sclerosis.. 
PELVIS CT WITHOUT CONTRAST:
FINDINGS: No free fluid within the pelvis.  The bladder, rectum and uterus appear normal.  The ovaries are normal.  No evidence of lymphadenopathy.  
Review of bone windows suggests no aggressive lesion.
IMPRESSION: 1.  No acute pelvic process. 
2.  There is diffuse sclerosis of the bones which likely represents renal osteodystrophy. 
Finding discused with Dr Fiseha on 09/12/07.

## 2009-09-19 ENCOUNTER — Ambulatory Visit: Payer: Self-pay | Admitting: Internal Medicine

## 2009-09-19 ENCOUNTER — Inpatient Hospital Stay (HOSPITAL_COMMUNITY): Admission: RE | Admit: 2009-09-19 | Discharge: 2009-09-24 | Payer: Self-pay | Admitting: Nephrology

## 2009-09-19 ENCOUNTER — Emergency Department (HOSPITAL_COMMUNITY): Admission: EM | Admit: 2009-09-19 | Discharge: 2009-09-19 | Payer: Self-pay | Admitting: Emergency Medicine

## 2009-09-20 IMAGING — CR DG CHEST 1V PORT
1 series · 1 of 1 positions shown · non-contrast
Comparison: none

CLINICAL DATA: Chest pain. Shortness of breath. 
PORTABLE SEMI-ERECT CHEST - 1 VIEW ? 09/20/07 AT [DATE] P.M.:
CLINICAL DATA: 03/18/07

[AP]
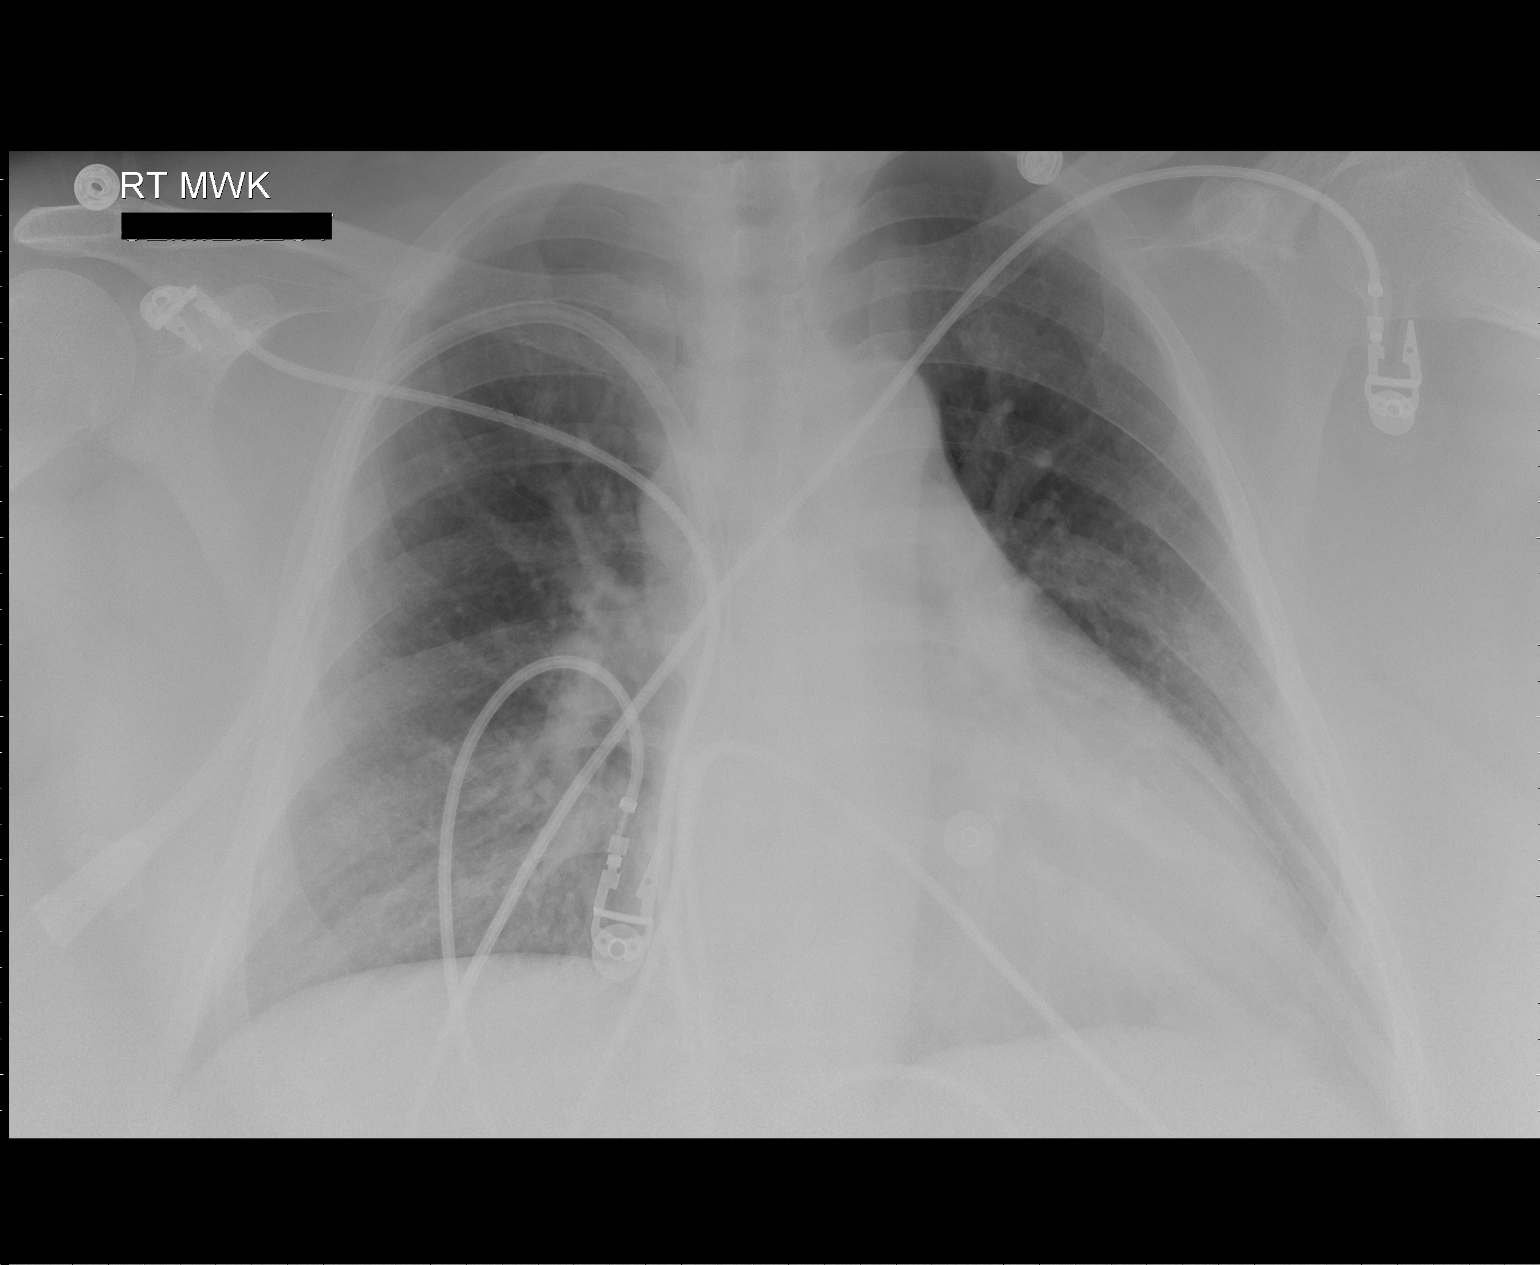

[1 of 1 positions shown; findings below may reference images not displayed]

FINDINGS: Right central line tip distal superior vena cava. No pneumothorax. Cardiomegaly and pulmonary vascular congestion. No segmental infiltrate. A previously noted opacity within the right upper lung zone is not as well appreciated no the present exam.  However, recommend 2-view followup chest with cardiac leads removed for further delineation.
IMPRESSION: 1.  Cardiomegaly and pulmonary vascular congestion. 
2.  Previously questioned right upper lobe opacity is not appreciated on the present exam. A followup 2-view with cardiac leads removed is recommended.
3.  Right central line tip distal superior vena cava without pneumothorax.

## 2009-09-22 IMAGING — NM NM PULM PERFUSION & VENT (REBREATHING & WASHOUT)
2 series · 12 of 12 positions shown · non-contrast
Comparison: none

CLINICAL DATA: Dyspnea on exertion, shortness of breath

[Series 1: vq lung vent perf · 2.54mm/px · 6 of 20 frames shown (1 of 2)]
[frame 2/20  full-range]
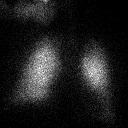
[frame 5/20  full-range]
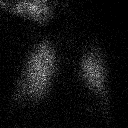
[frame 9/20  full-range]
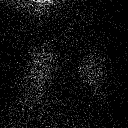
[frame 12/20]
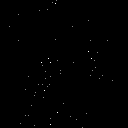
[frame 15/20]
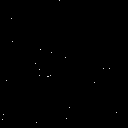
[frame 19/20]
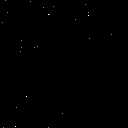

[Series 1: vq lung vent perf · 2.54mm/px · 6 of 20 frames shown (2 of 2)]
[frame 2/20  full-range]
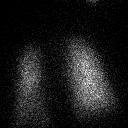
[frame 5/20  full-range]
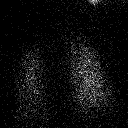
[frame 9/20]
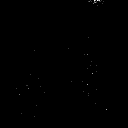
[frame 12/20]
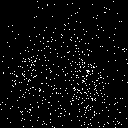
[frame 15/20]
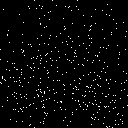
[frame 19/20]
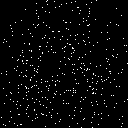

[12 of 12 positions shown; findings below may reference images not displayed]

Ventilation perfusion scintigraphy:

No previous for comparison. Today's chest radiograph shows mild cardiomegaly, no
edema, mild bibasilar atelectasis. Ventilation images after 10.9 mCi [id number]
inhaled show homogeneous distribution of radiopharmaceutical to both lungs with
no significant retention on washout images.
Perfusion scintigraphy with 6.5 mCi YcEEm MAA IV shows a single small perfusion
defect in the posterior segment left upper lobe. There there is otherwise normal
distribution throughout the lungs.
IMPRESSION: 1. Low likelihood ratio for pulmonary embolism.

## 2009-09-22 IMAGING — CR DG CHEST 2V
2 series · 2 of 2 positions shown · non-contrast
Comparison: Chest of 09/20/07.

CLINICAL DATA: Chest pain.  Shortness of breath. 
 CHEST ? 2 VIEW:

[view not recorded (1 of 2)]
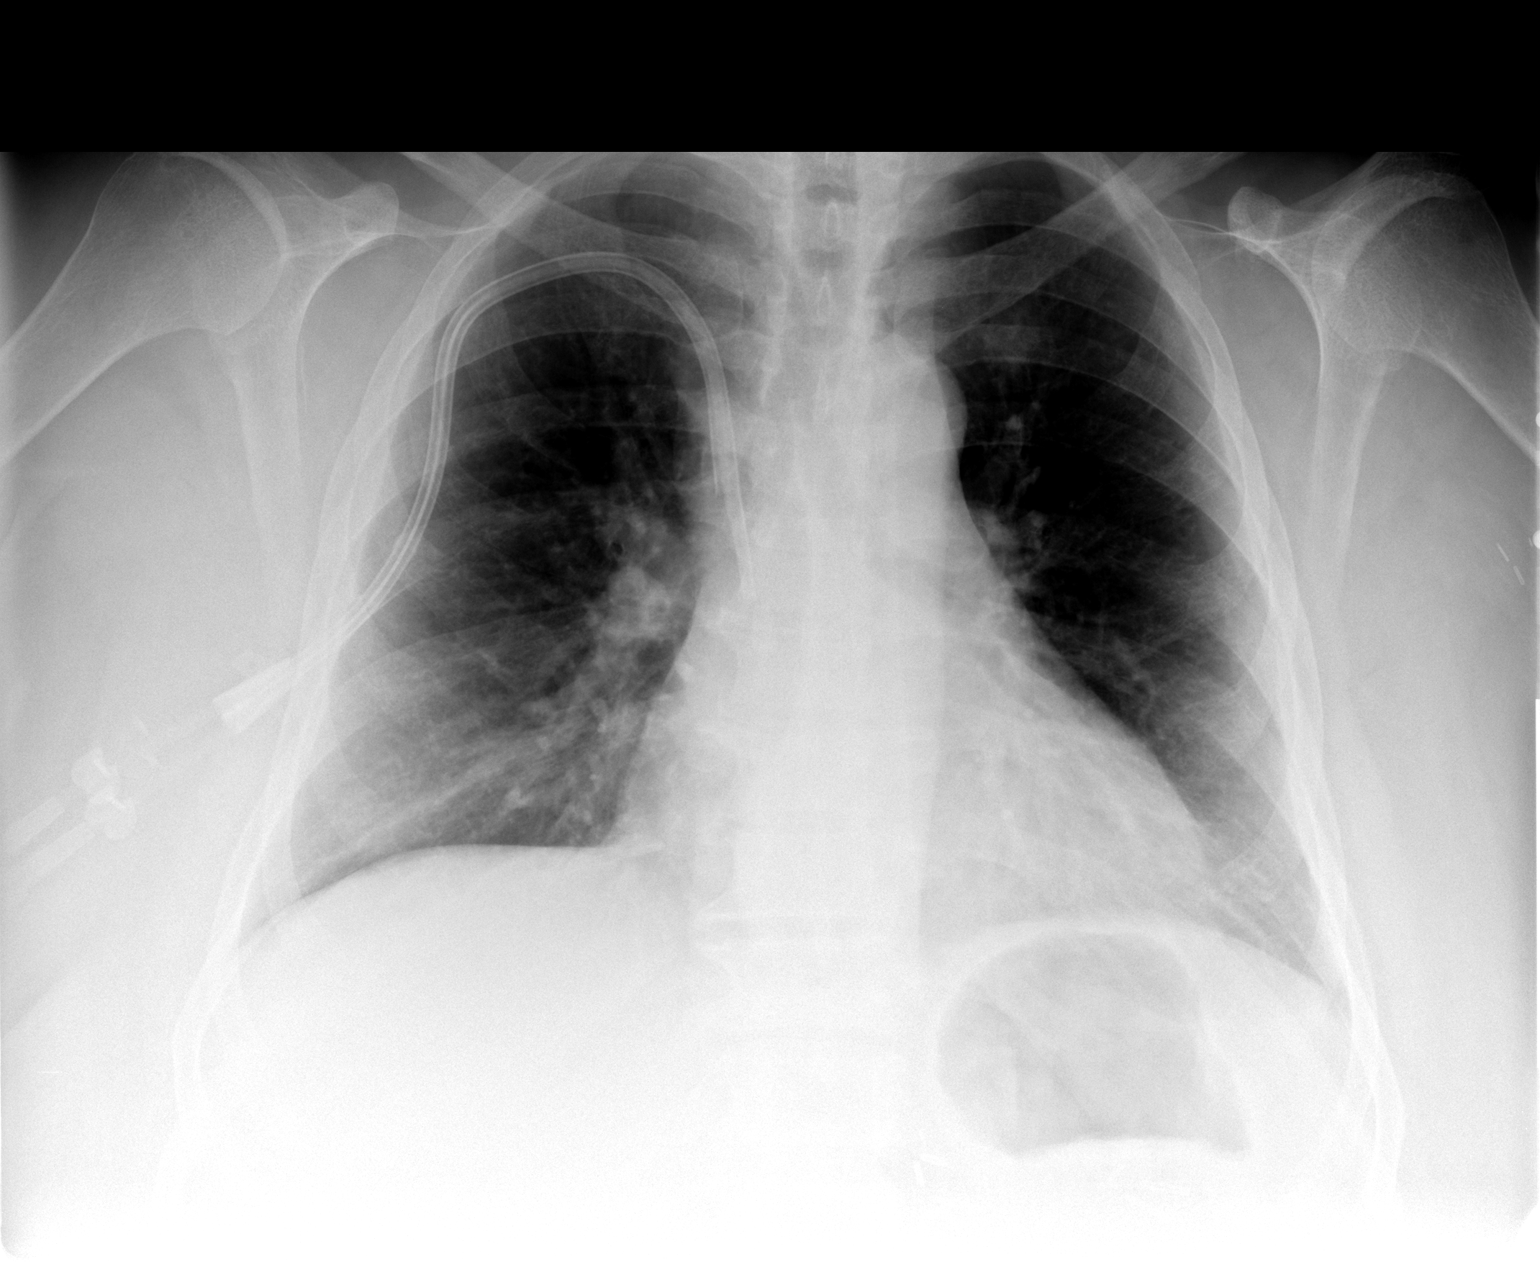

[view not recorded (2 of 2)]
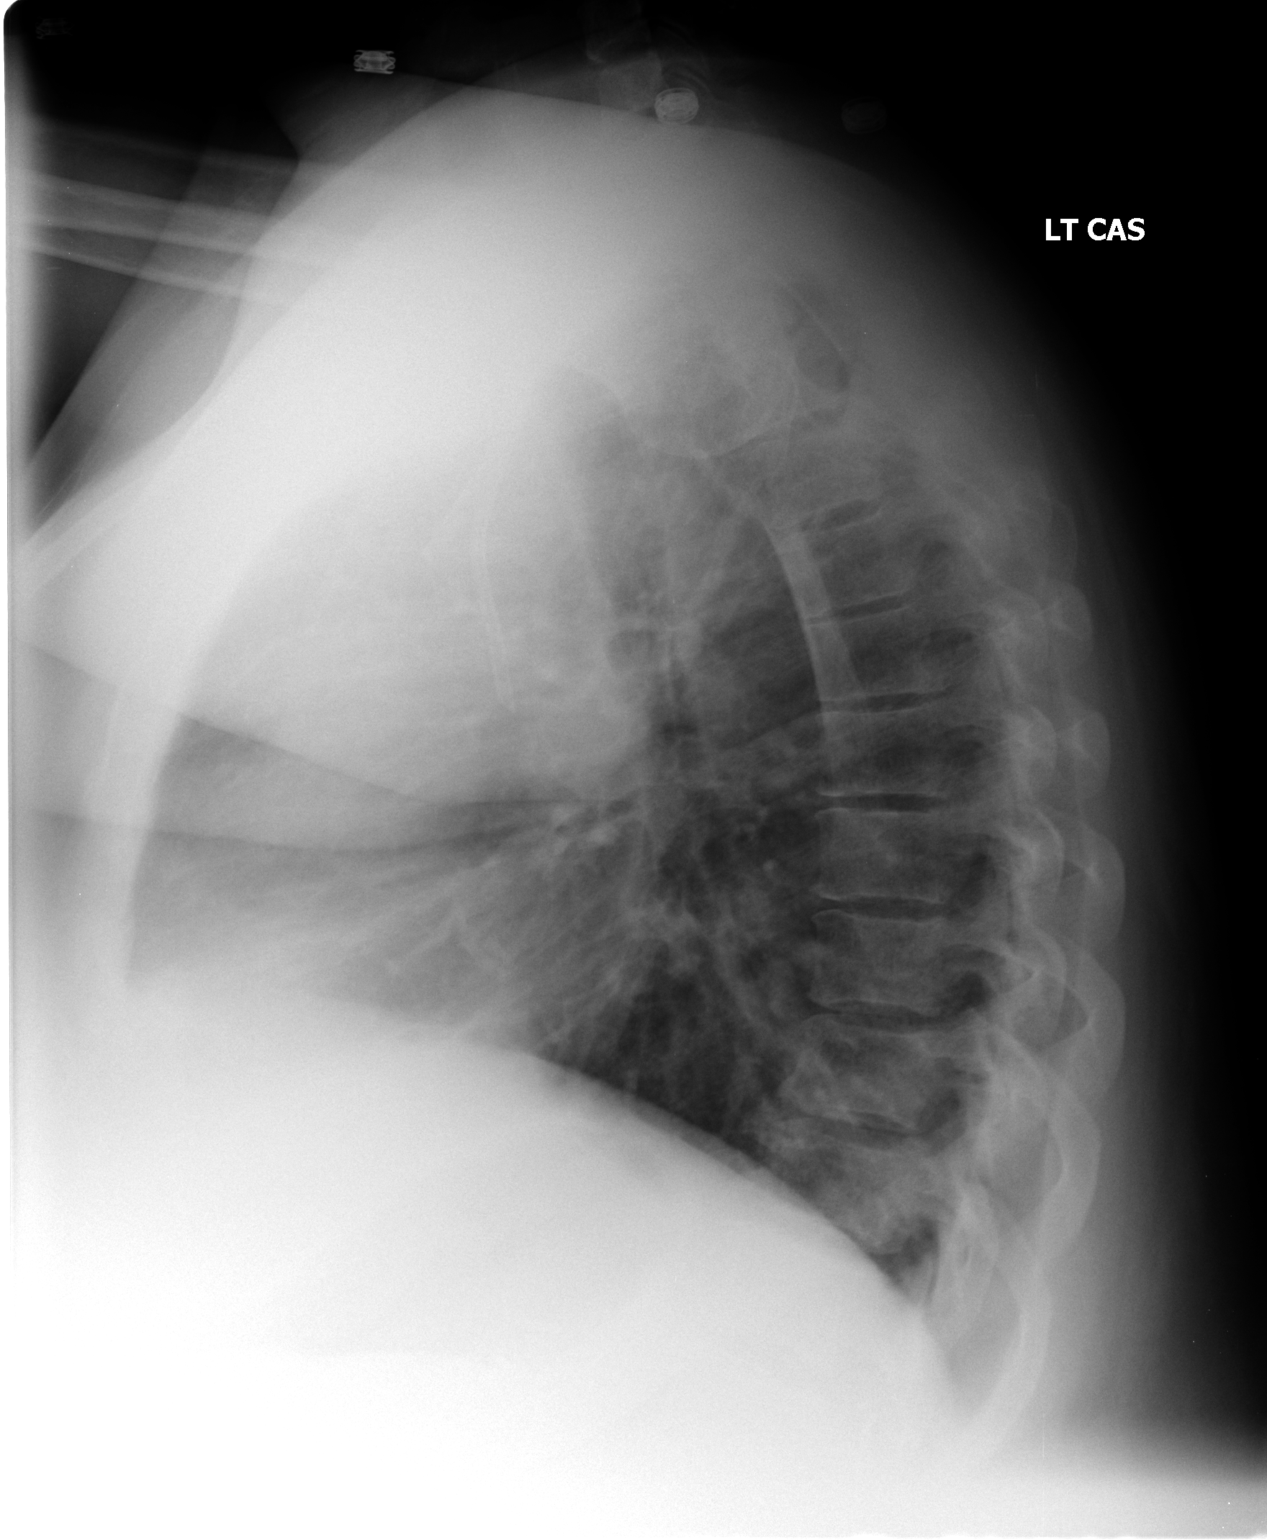

[2 of 2 positions shown; findings below may reference images not displayed]

FINDINGS: A right-sided dialysis catheter has distal tips in the mid and distal superior vena cava.  There is mild cardiomegaly.  Pulmonary vascularity appears to be within normal limits.  Previously identified pulmonary vascular congestion from 09/20/07 is improved.  Minimal subsegmental atelectasis at the lung bases.  Negative for edema or effusion.  Previously questioned nodular density in the right lung from a PA and lateral chest radiograph of 03/18/07 is not seen on today?s chest radiograph and may have related to lower lung volumes/atelectasis at the time of the prior radiograph.  No acute osseous abnormality.
IMPRESSION: 1.  Cardiomegaly and minimal bibasilar atelectasis. 
 2.  Previously questioned nodular opacity in the right lung is not seen on today?s examination and may have been due to lower lung volumes/atelectasis at the time of the previous exam.

## 2009-10-03 IMAGING — CR DG CHEST 1V PORT
1 series · 1 of 1 positions shown · non-contrast
Comparison: 2 view chest 09/22/07

CLINICAL DATA: End stage renal disease, on hemodialysis.  Shortness of breath.
 PORTABLE CHEST ? 1 VIEW ? 5749 hours:

[AP]
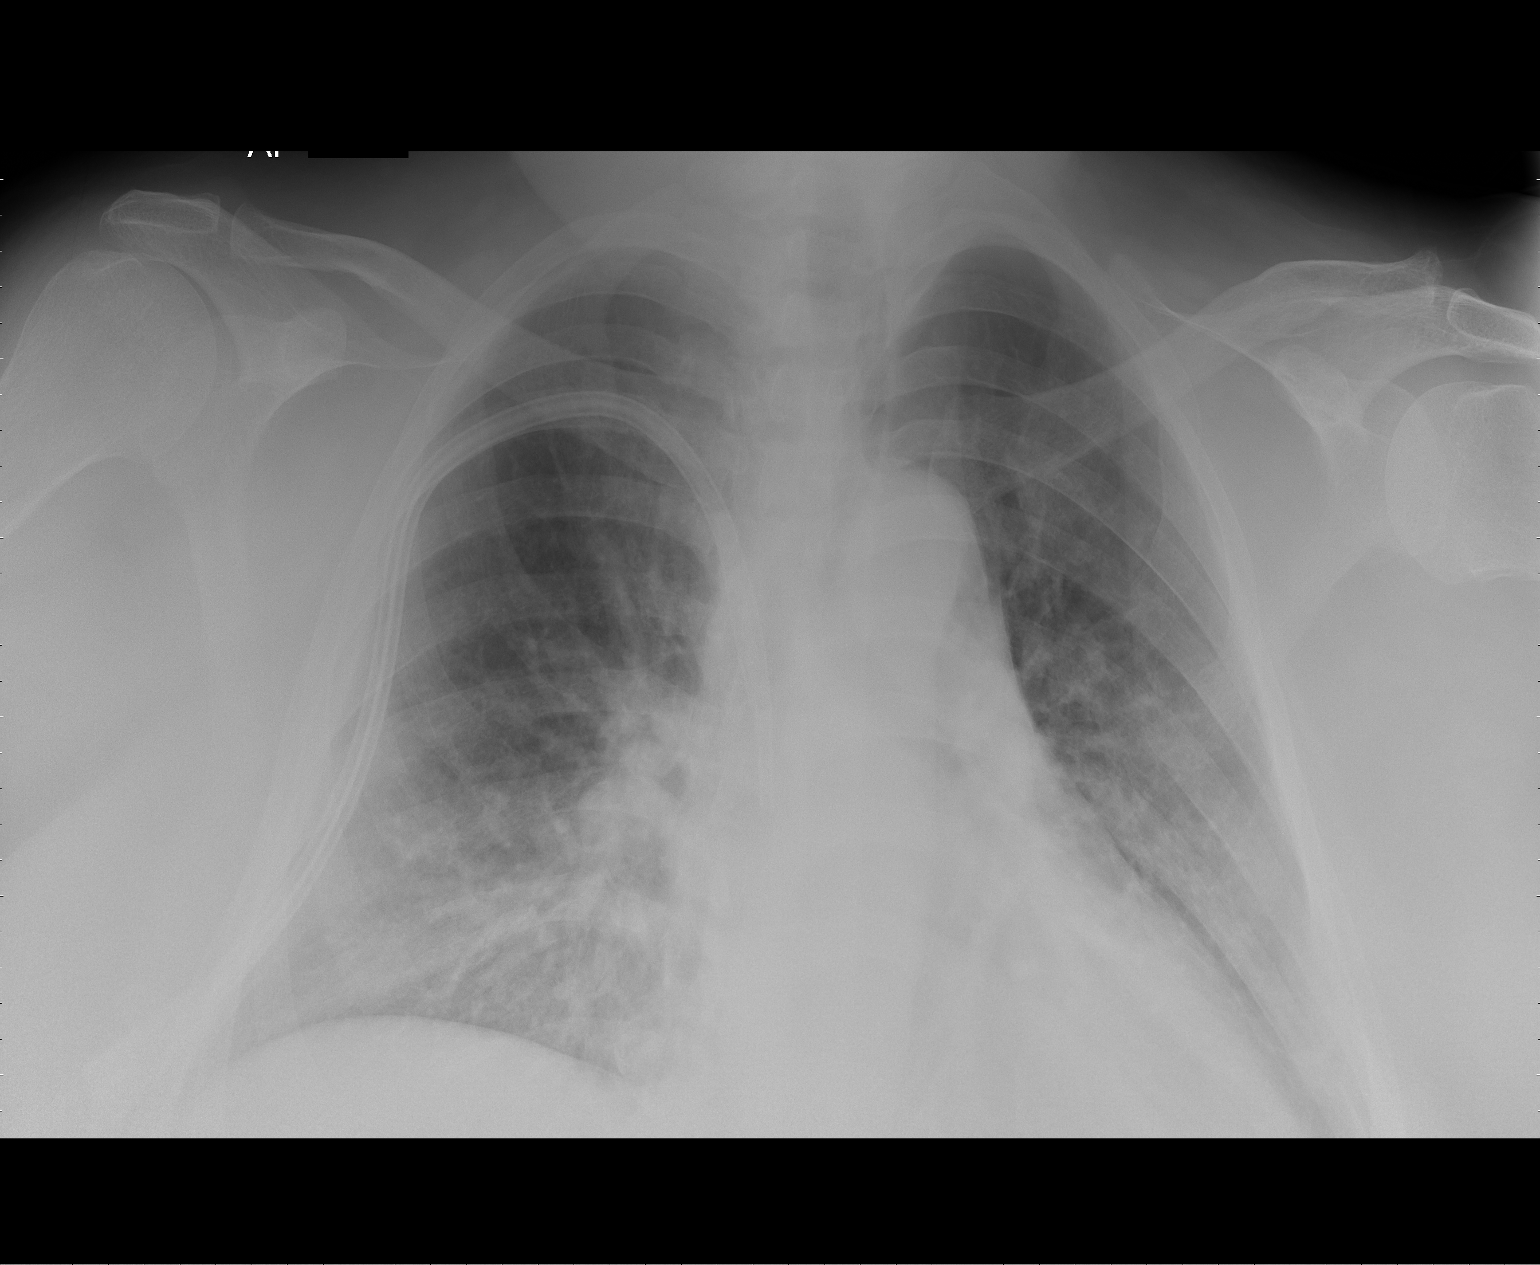

[1 of 1 positions shown; findings below may reference images not displayed]

FINDINGS: Interval development of mild diffuse interstitial pulmonary edema.  Heart mildly enlarged but stable.  No visible pleural effusions.  No localized airspace consolidation.  Right subclavian dialysis catheter tip in mid SVC.
IMPRESSION: Mild CHF/fluid overload.

## 2009-10-07 IMAGING — CR DG CHEST 1V PORT
1 series · 1 of 1 positions shown · non-contrast
Comparison: 10/03/2007

CLINICAL DATA: Swelling;  DYSPNEA.

PORTABLE CHEST - 1 VIEW

[AP]
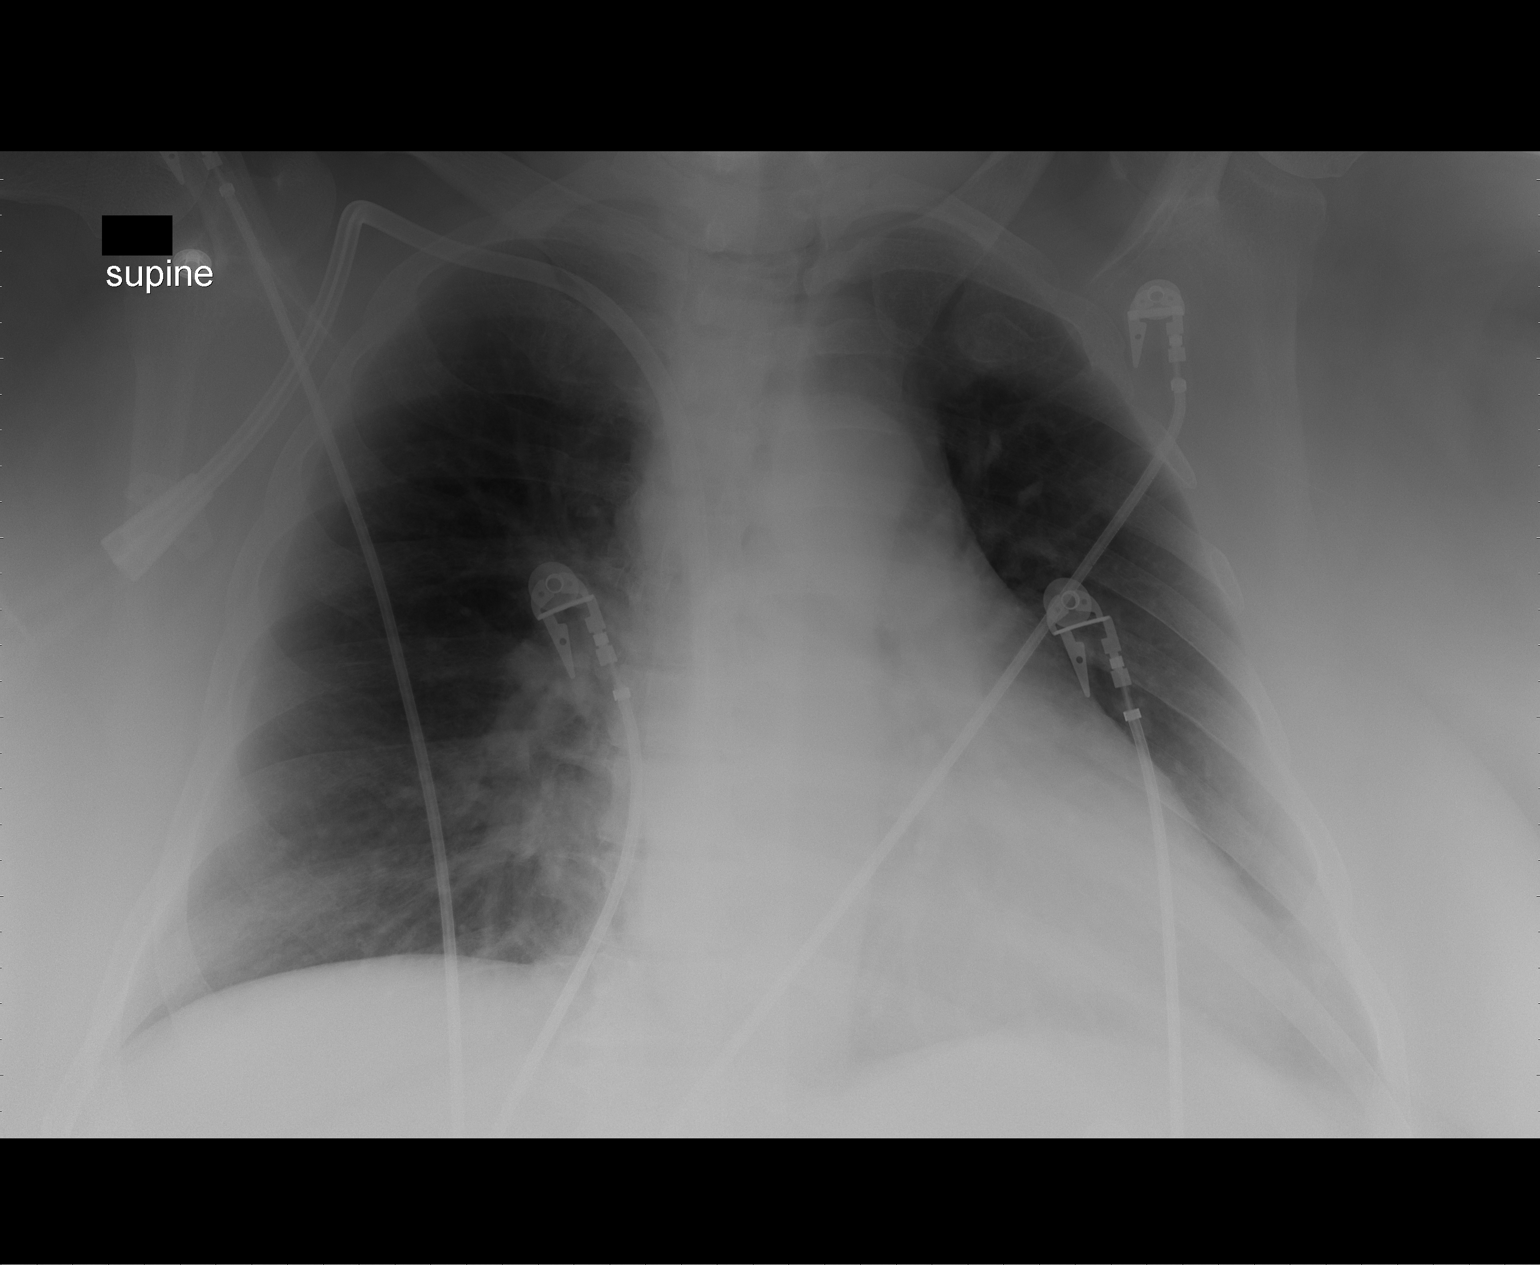

[1 of 1 positions shown; findings below may reference images not displayed]

FINDINGS: Cardiomegaly noted without evidence of pulmonary edema.
A right subclavian central venous catheter is again identified.
No evidence of focal airspace disease, pleural effusions or
pneumothorax noted.
IMPRESSION: Cardiomegaly without evidence of acute cardiopulmonary disease.

## 2009-10-12 IMAGING — CR DG CHEST 2V
1 series · 1 of 1 positions shown · non-contrast
Comparison: 10/09/2007

CLINICAL DATA: Chest pain short of breath.  Dialysis patient.

CHEST - 2 VIEW

[view not recorded]
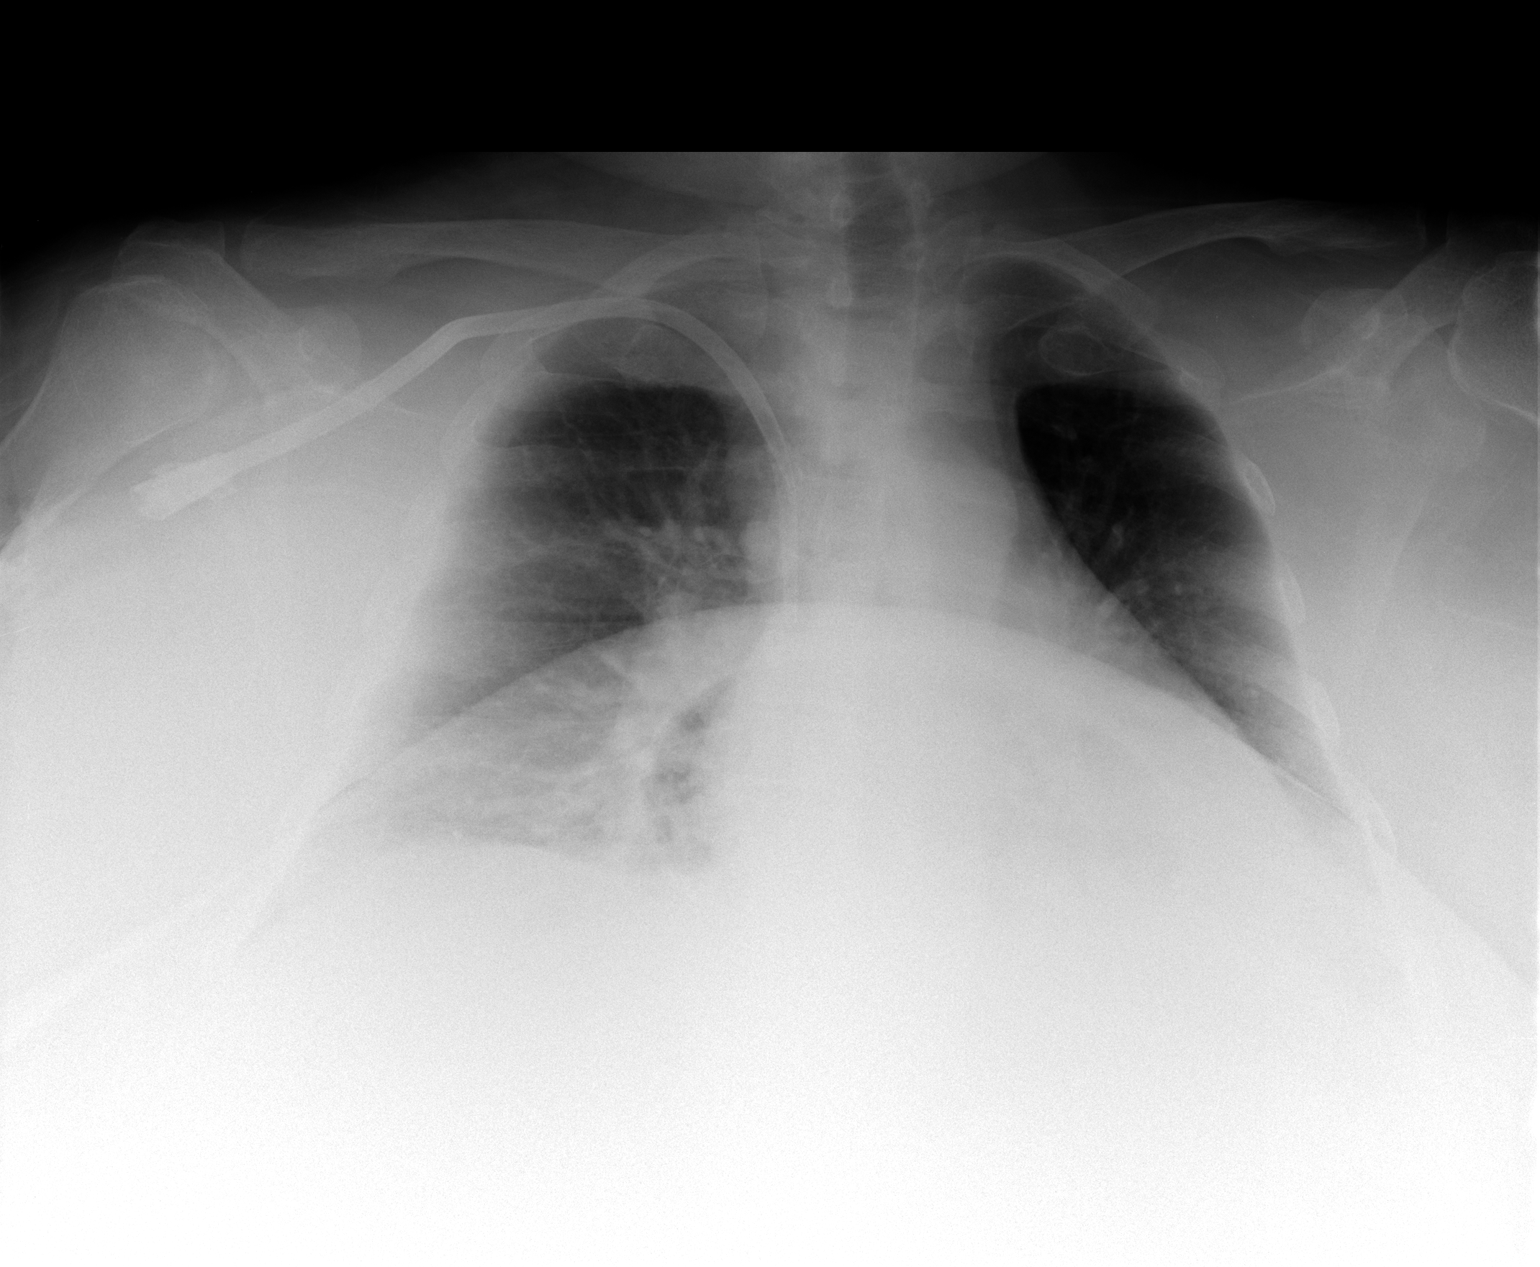

[1 of 1 positions shown; findings below may reference images not displayed]

FINDINGS: Suboptimal positioning with lordotic projection.  The
heart is enlarged, there is no edema or effusion.  There may be
some left lower lobe atelectasis.  Right subclavian catheter tip is
in the SVC and unchanged.
IMPRESSION: Cardiac enlargement with mild vascular congestion, no edema

Possible left lower lobe air space disease which may be
atelectasis.  This is not well evaluated due to patient
positioning.

## 2009-10-14 ENCOUNTER — Encounter: Payer: Self-pay | Admitting: Internal Medicine

## 2009-10-14 IMAGING — CR DG CHEST 1V PORT
1 series · 1 of 1 positions shown · non-contrast
Comparison: Film earlier today

CLINICAL DATA: Missed dialysis, mid chest pain

PORTABLE CHEST - 1 VIEW

[AP]
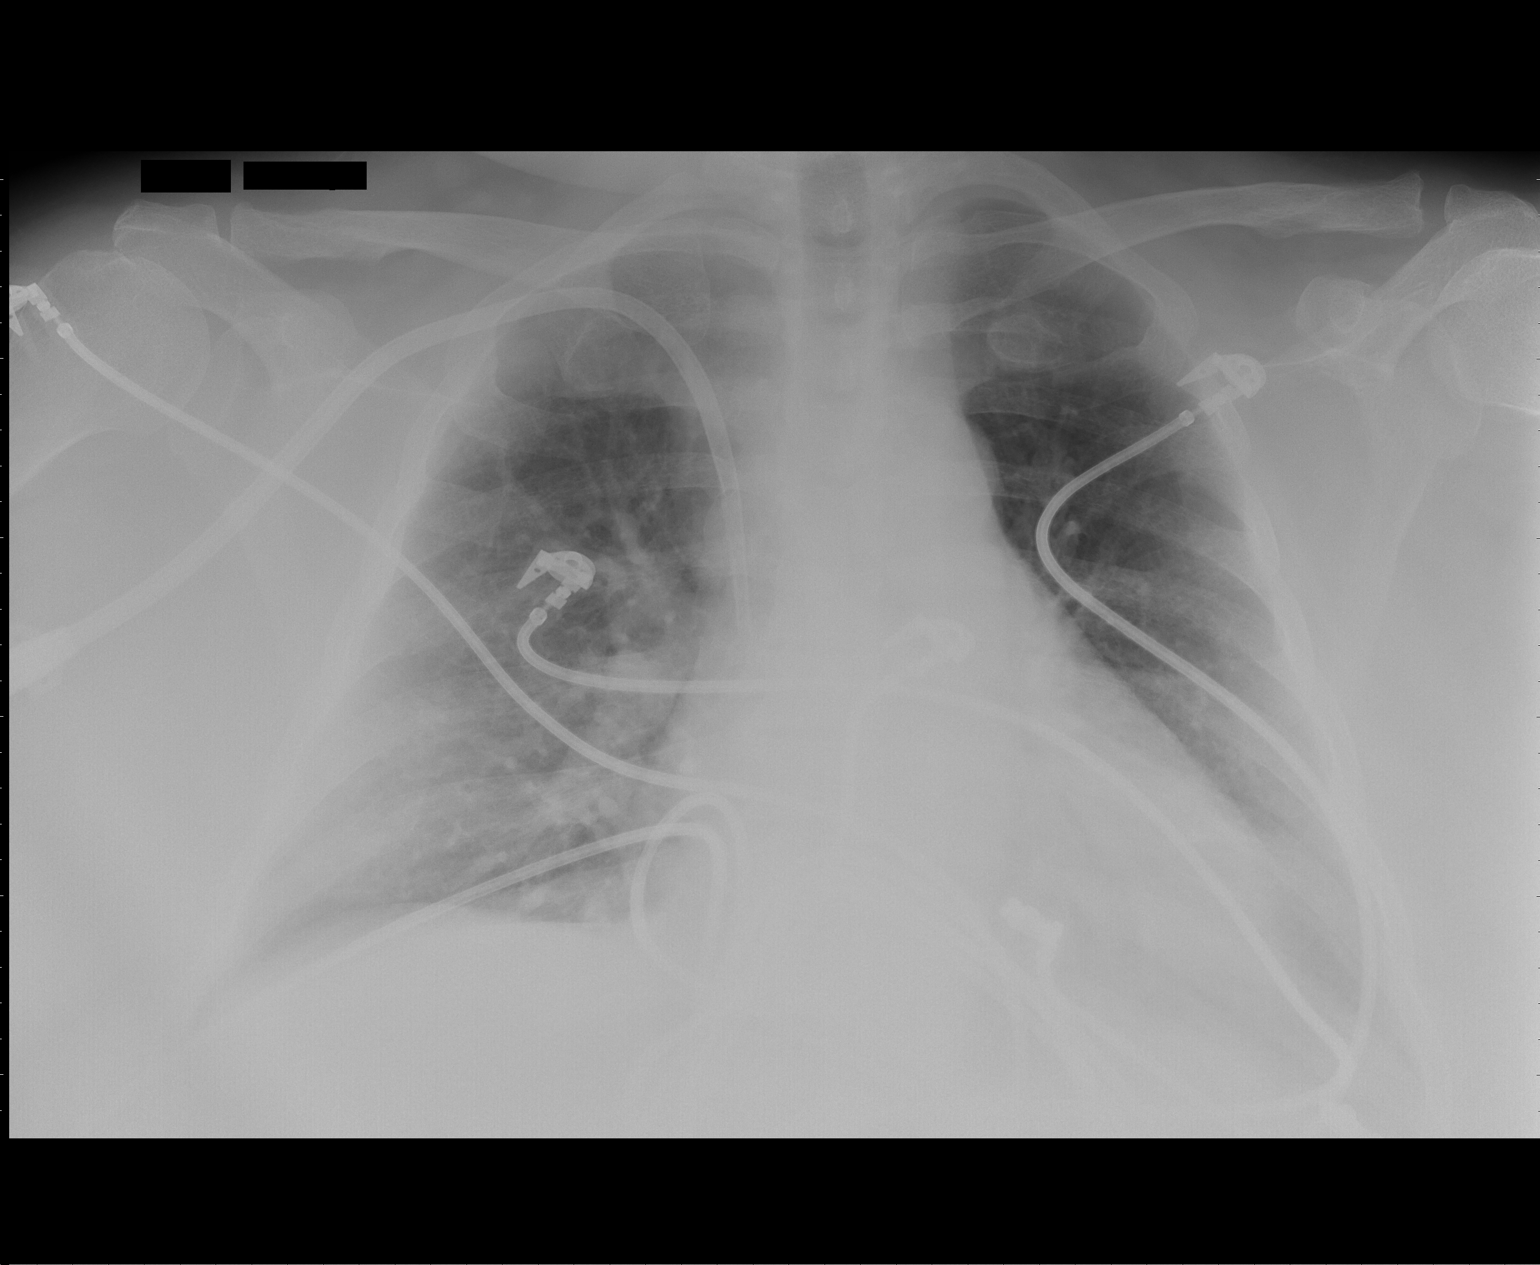

[1 of 1 positions shown; findings below may reference images not displayed]

FINDINGS: No significant change in cardiomegaly and mild vascular
congestion.  Dialysis catheter unchanged in position.  No
pneumothorax.
IMPRESSION: Stable chest

## 2009-10-14 IMAGING — CR DG CHEST 2V
2 series · 2 of 2 positions shown · non-contrast
Comparison: 10/12/2007

CLINICAL DATA: Chest and lower abdominal pain.

CHEST - 2 VIEW

[w chest pa]
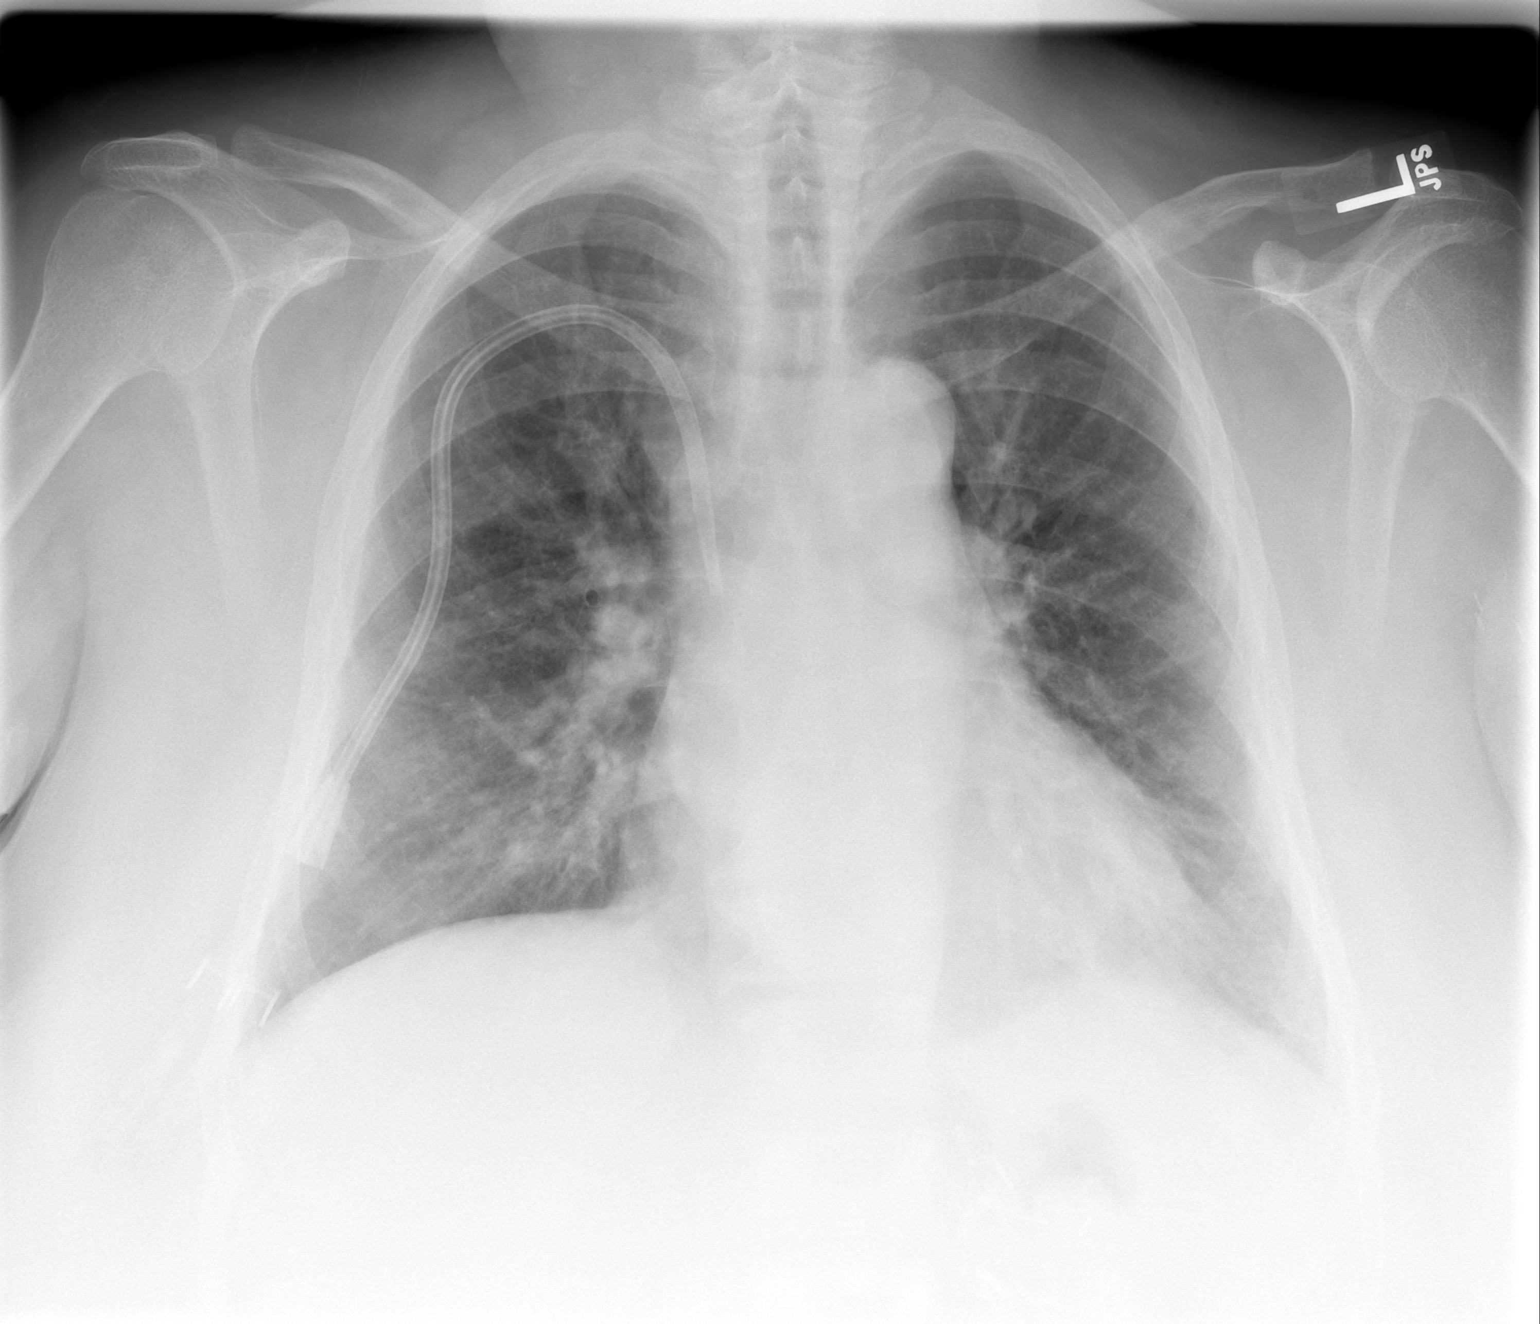

[w chest lat]
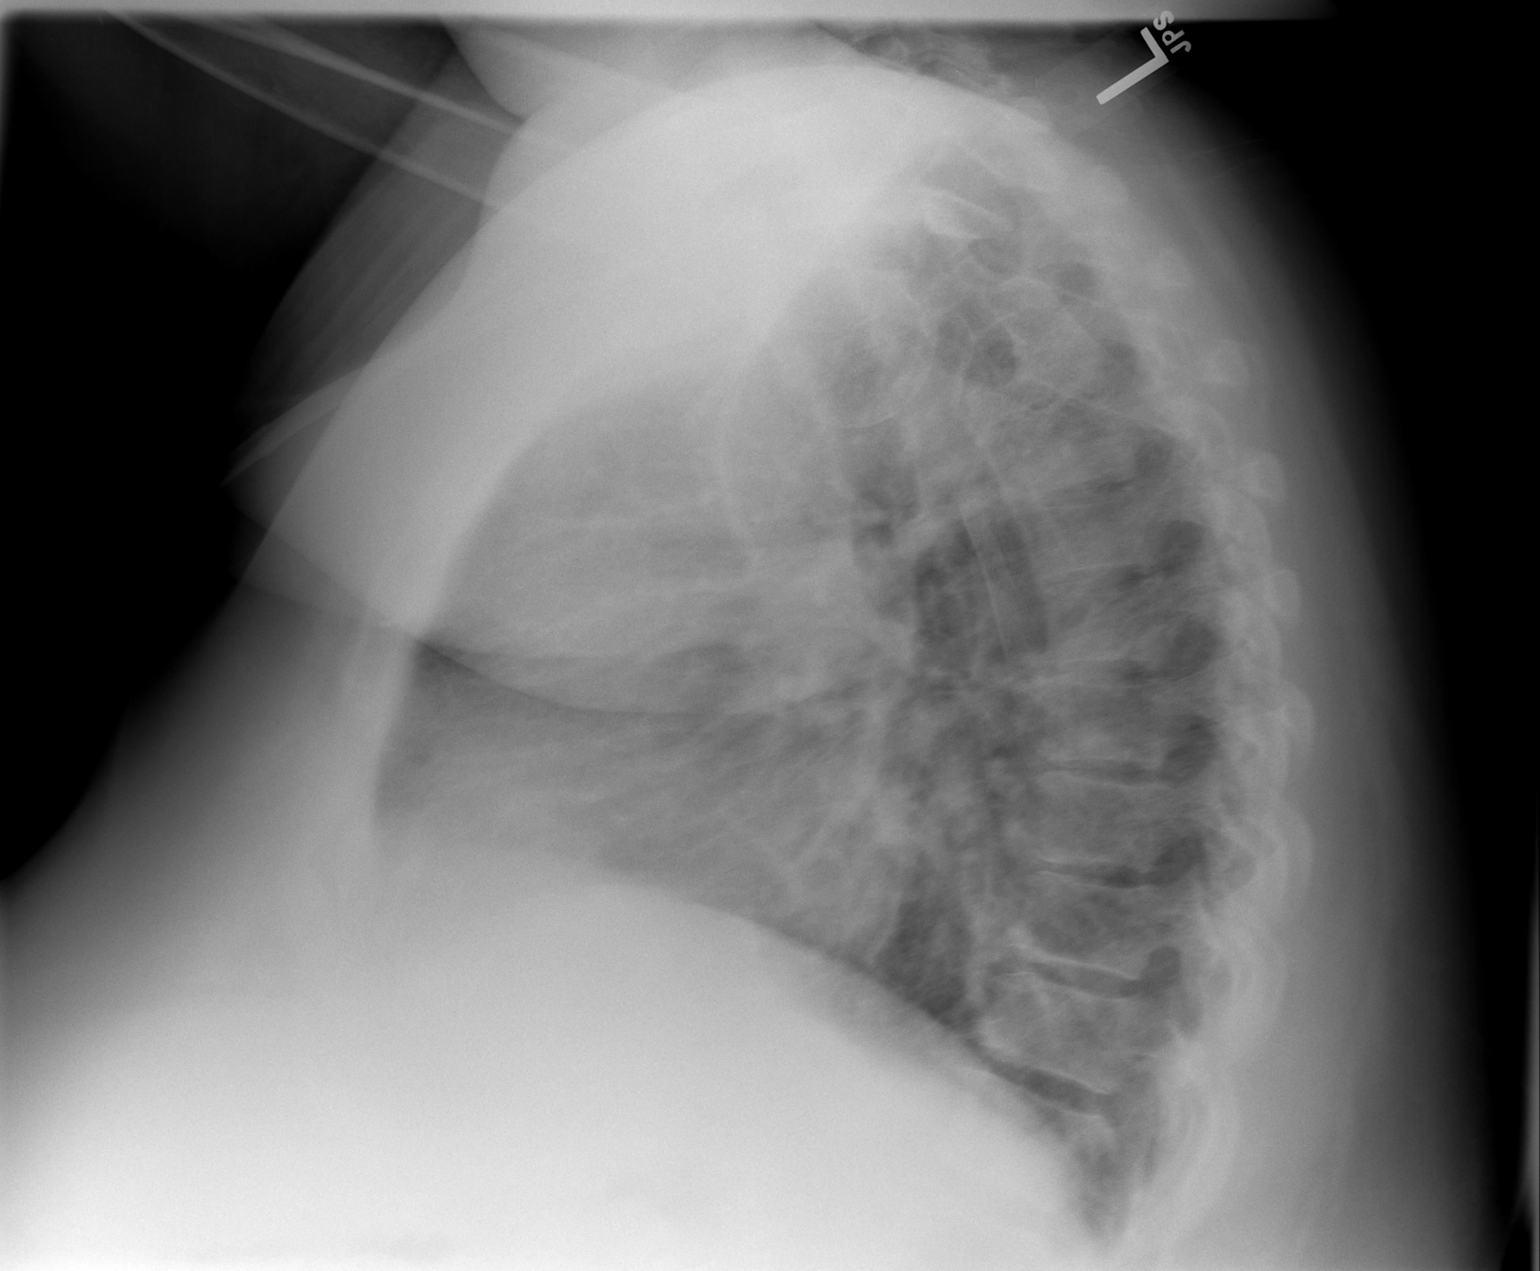

[2 of 2 positions shown; findings below may reference images not displayed]

FINDINGS: Right-sided dialysis catheter tip projects over the
superior vena cava.  Mild cardiomegaly noted.

There is pulmonary venous hypertension and mild interstitial
opacity in the lungs compatible with mild interstitial edema.  Mild
left basilar atelectasis is noted.  No overt airspace edema is
identified.  Lateral projection is reduced in sensitivity due to
motion artifact.
IMPRESSION: 1.  Interstitial edema.
2.  Mild left basilar atelectasis.
3.  Mild cardiomegaly.

## 2009-10-15 IMAGING — CR DG CHEST 2V
2 series · 2 of 2 positions shown · non-contrast
Comparison: 10/14/2007.

CLINICAL DATA: Shortness of breath.

CHEST - 2 VIEW

[w chest pa]
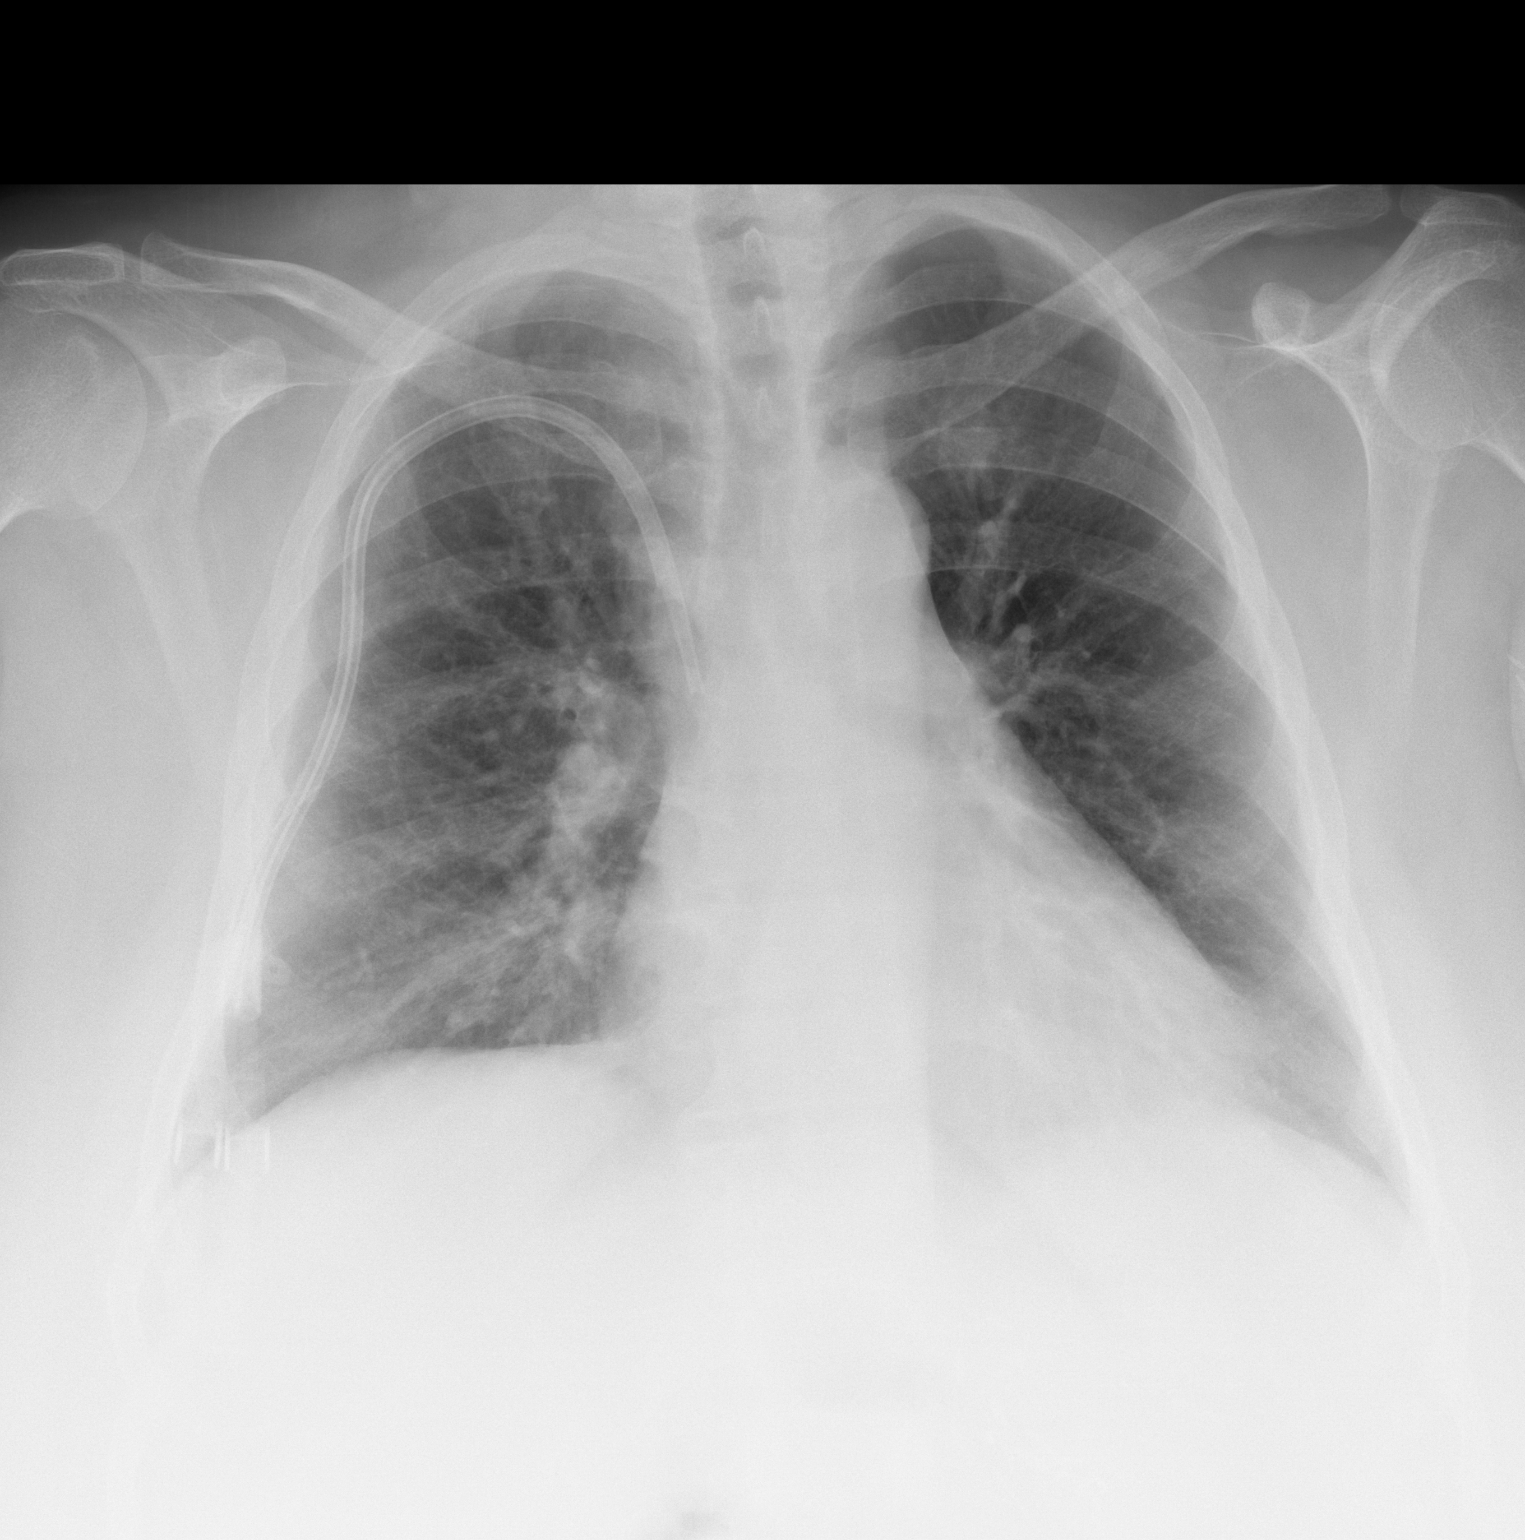

[w chest lat]
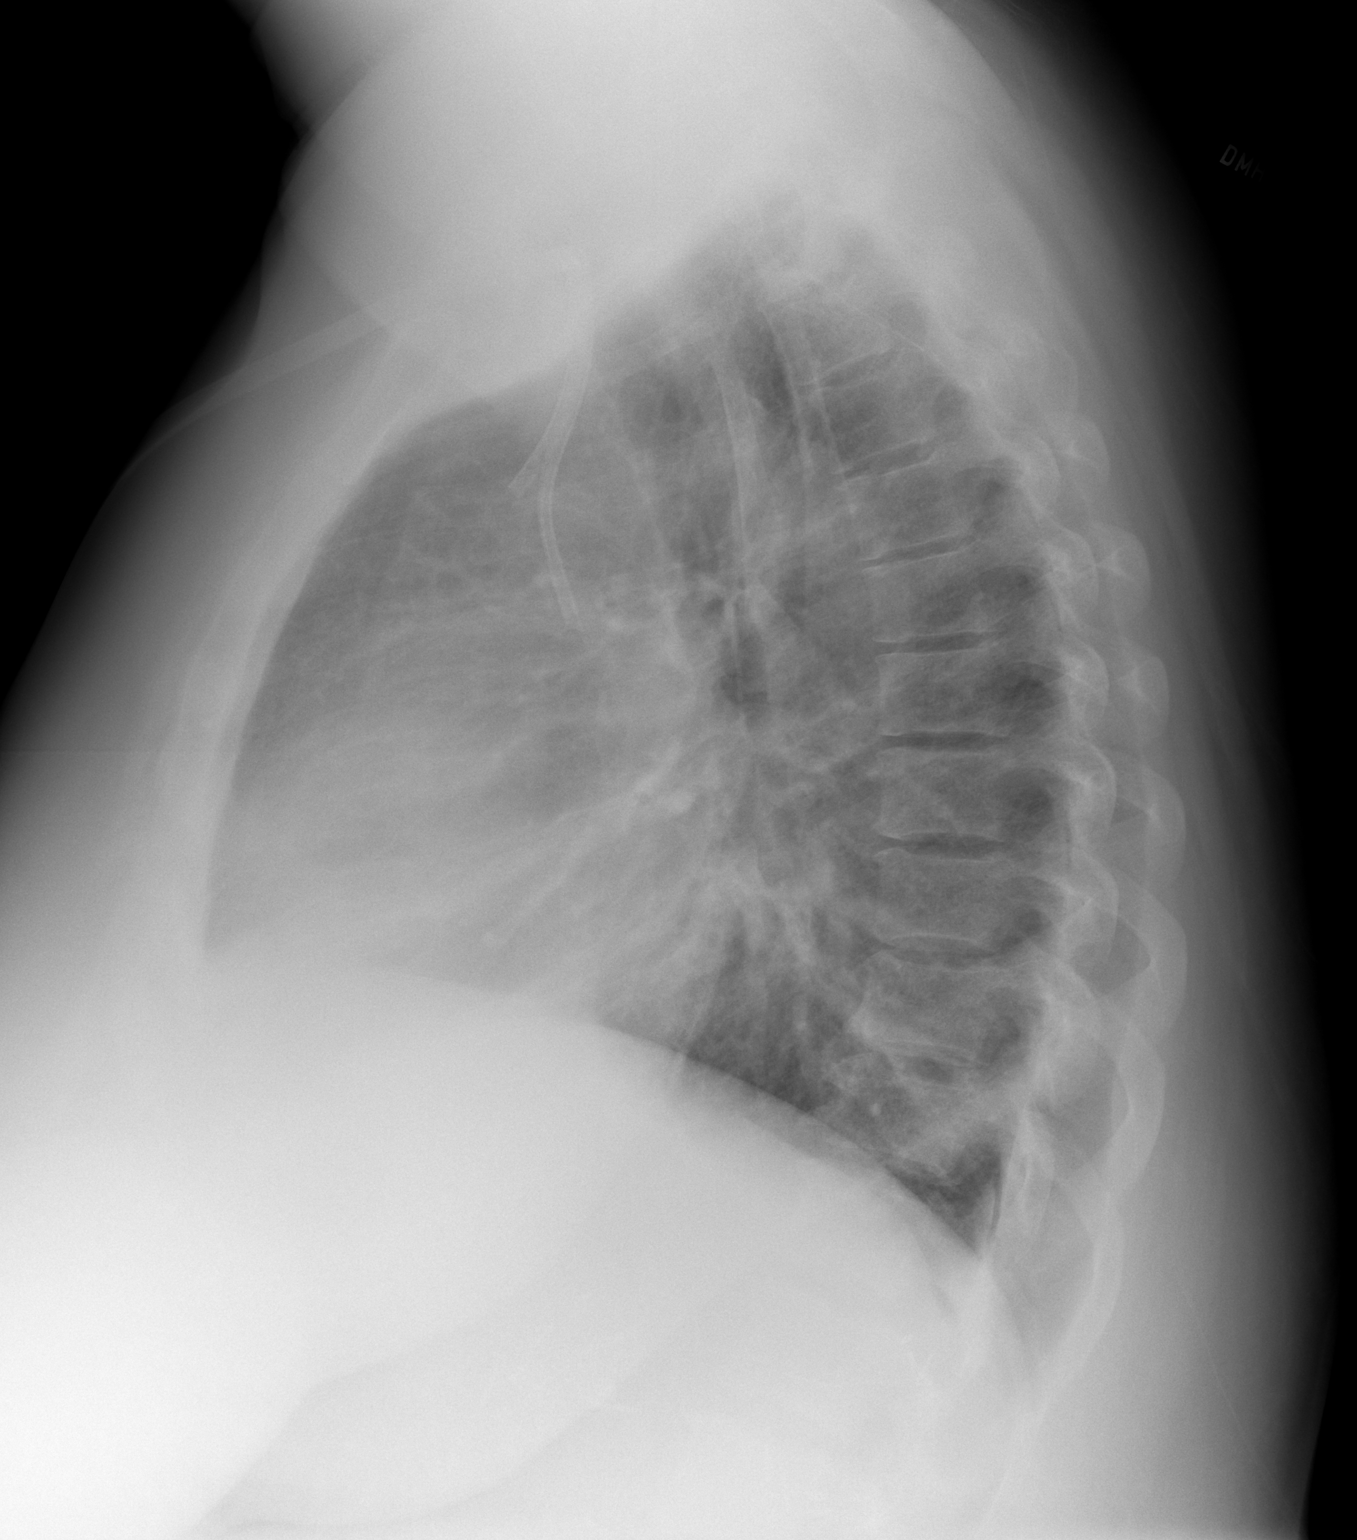

[2 of 2 positions shown; findings below may reference images not displayed]

FINDINGS: Trachea is midline.  Heart size stable.  Minimal
bibasilar atelectasis.  Lungs do not appear edematous.  No pleural
fluid.
IMPRESSION: Minimal bibasilar atelectasis.

## 2009-10-18 ENCOUNTER — Ambulatory Visit: Payer: Self-pay | Admitting: Internal Medicine

## 2009-10-25 ENCOUNTER — Telehealth: Payer: Self-pay | Admitting: Internal Medicine

## 2009-10-25 ENCOUNTER — Observation Stay (HOSPITAL_COMMUNITY): Admission: EM | Admit: 2009-10-25 | Discharge: 2009-10-25 | Payer: Self-pay | Admitting: Emergency Medicine

## 2009-11-03 ENCOUNTER — Telehealth: Payer: Self-pay | Admitting: Internal Medicine

## 2009-11-03 IMAGING — CR DG CHEST 2V
2 series · 2 of 2 positions shown · non-contrast
Comparison: Radiograph 10/15/2007

CLINICAL DATA: Right clavicle pain

CHEST - 2 VIEW

[w chest pa]
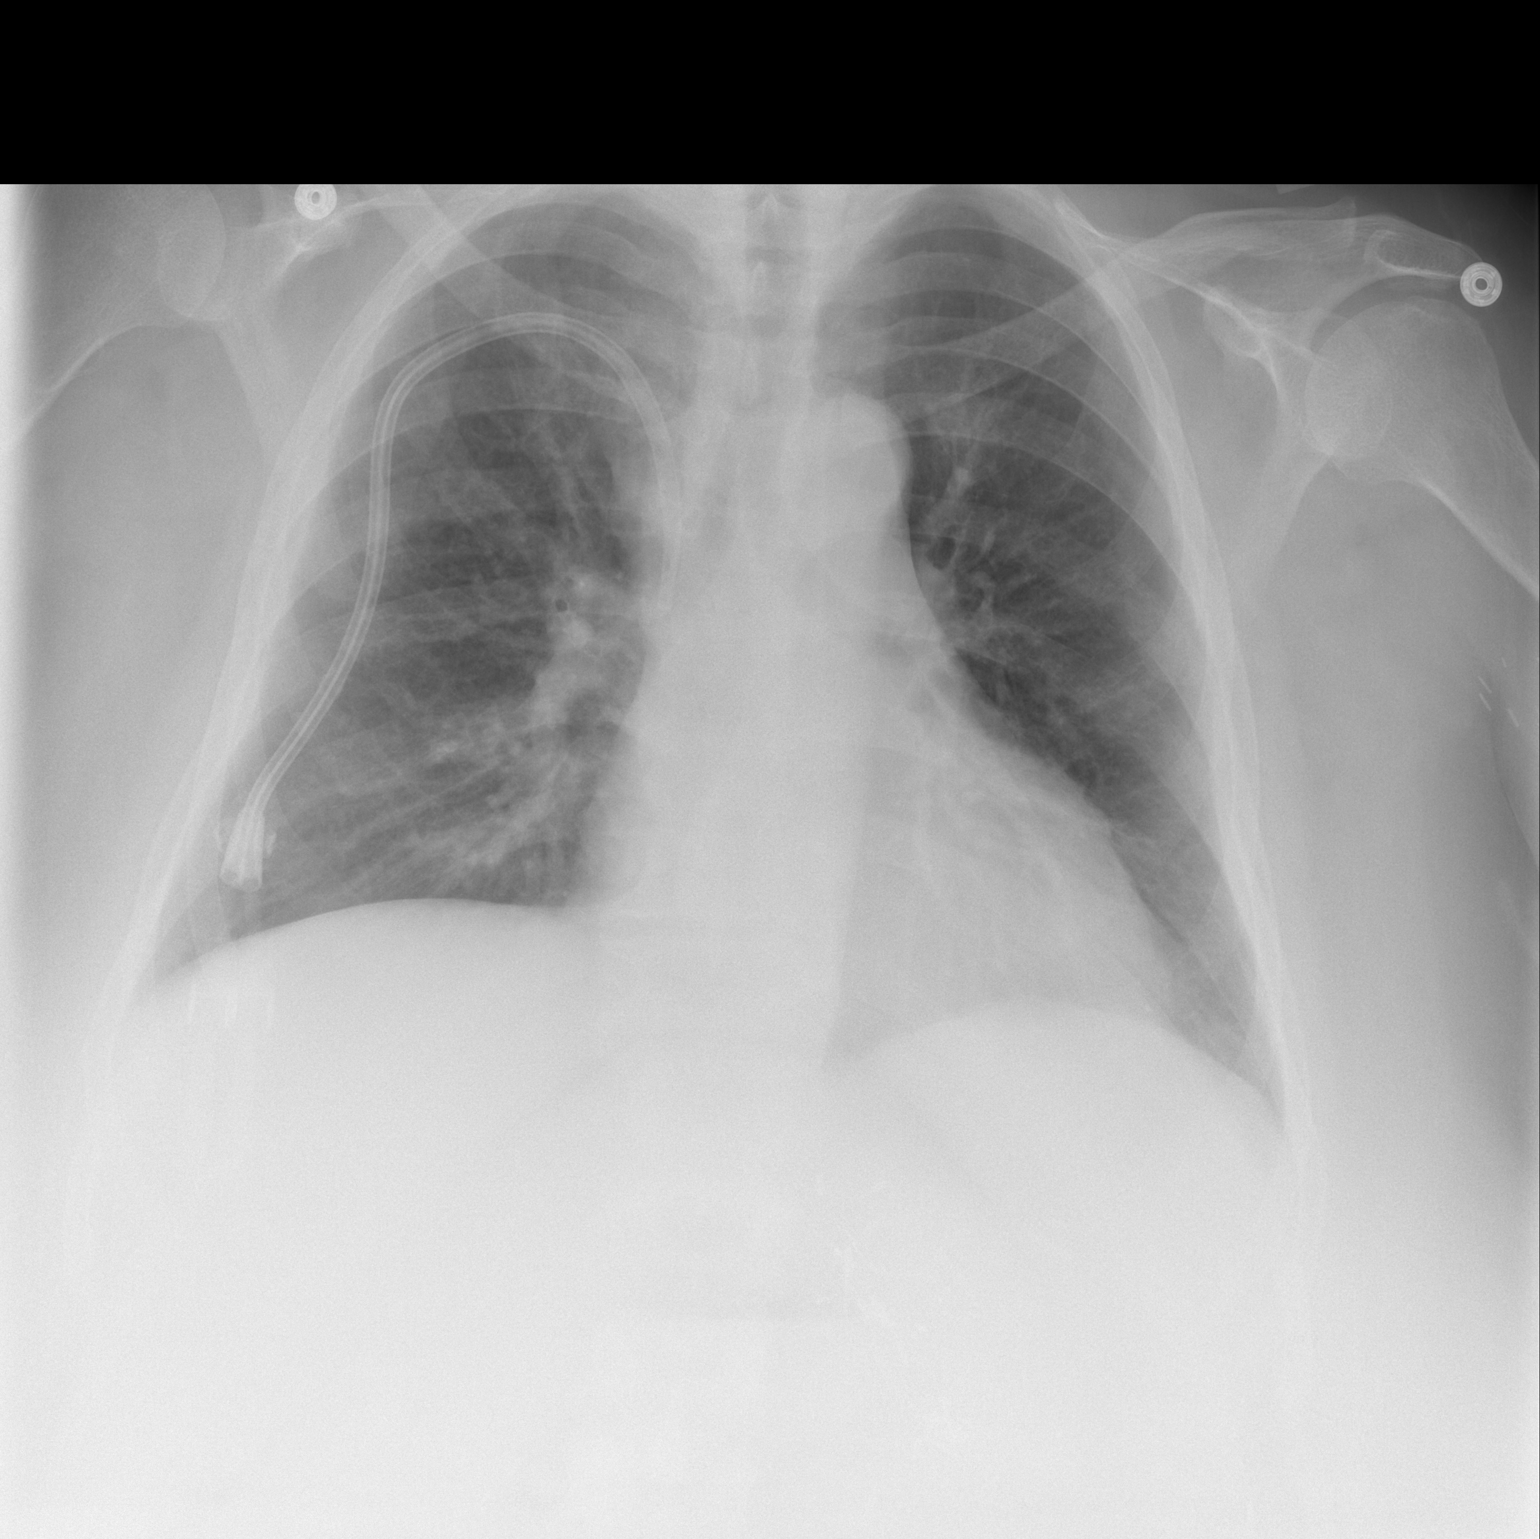

[w chest lat]
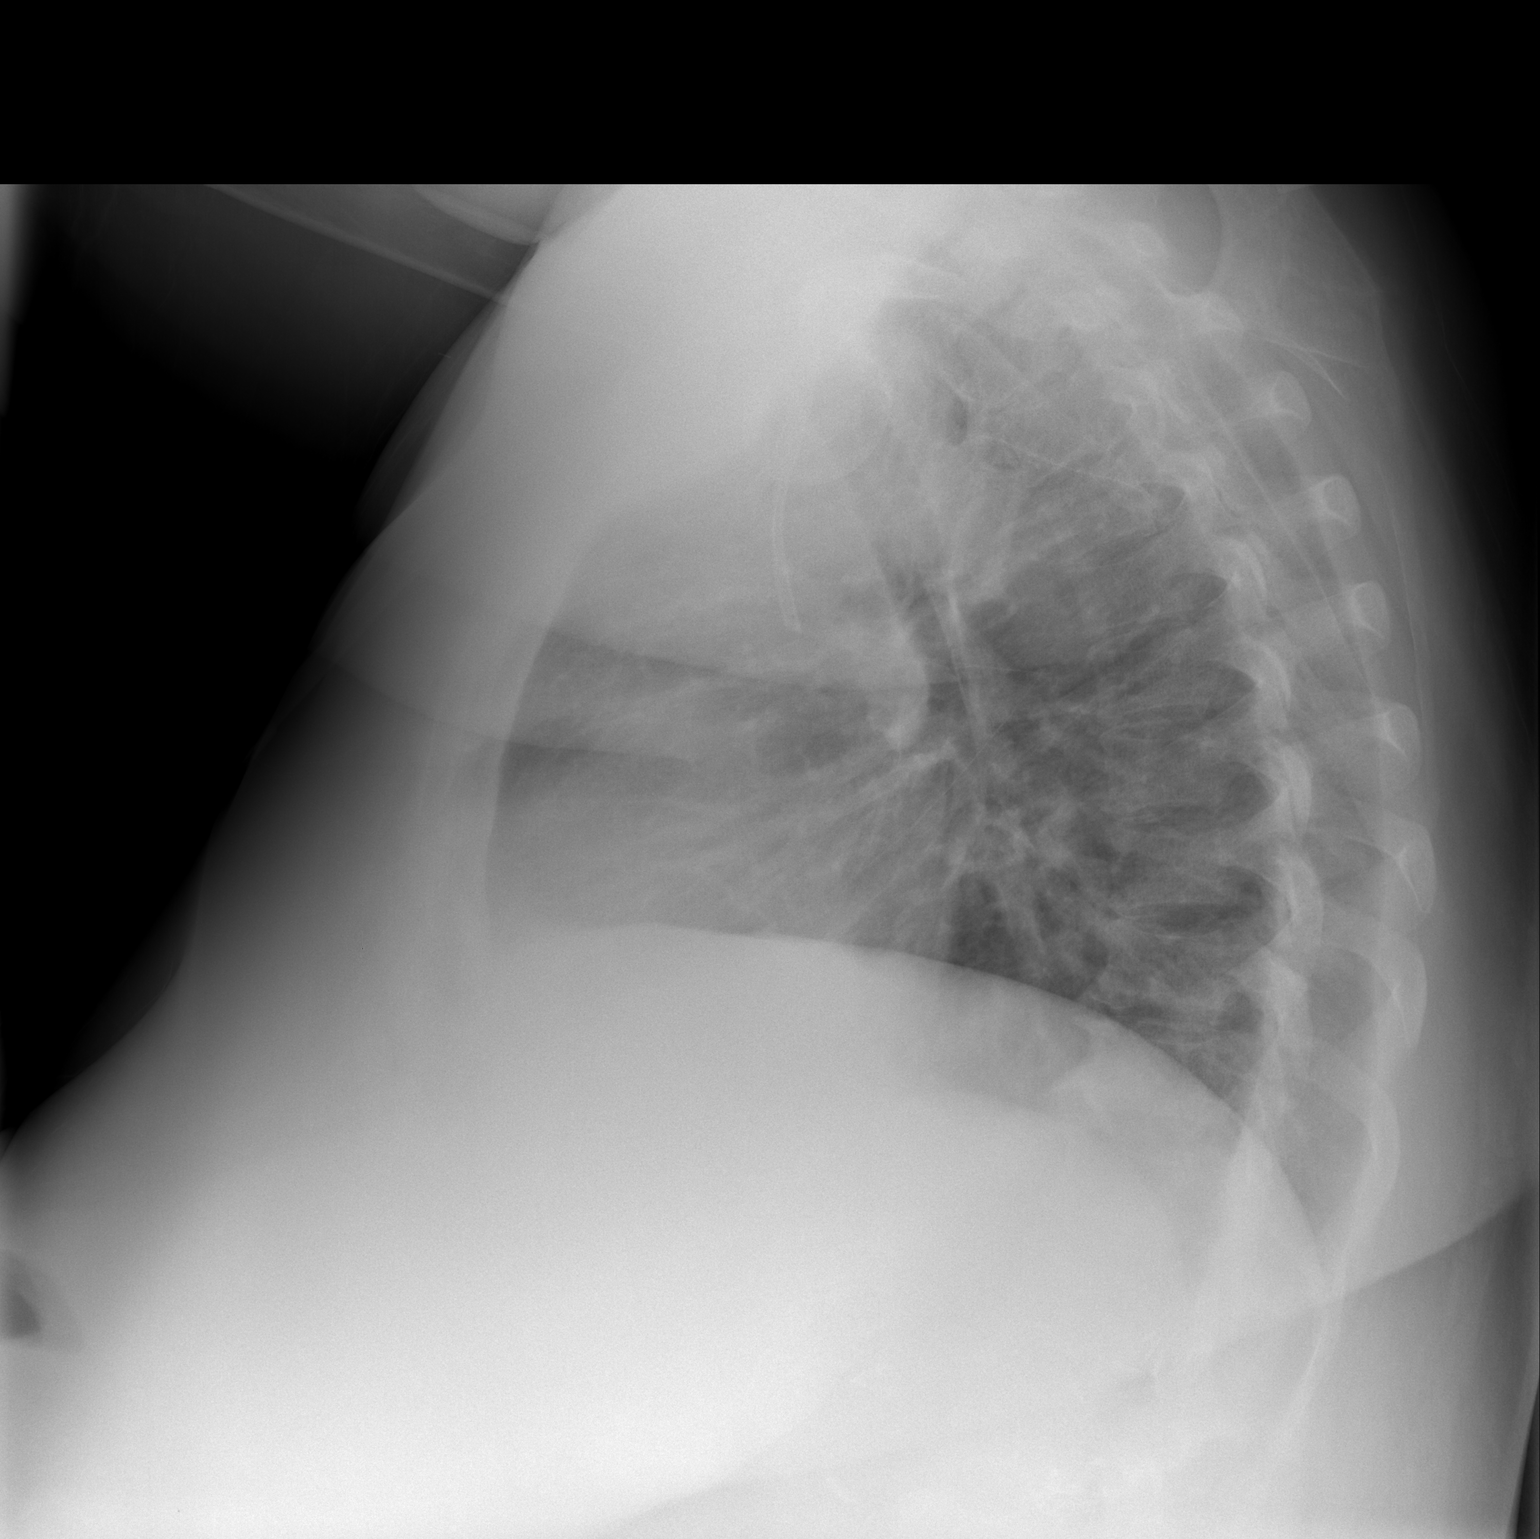

[2 of 2 positions shown; findings below may reference images not displayed]

FINDINGS: Central venous catheter with tip in the distal SVC.
Normal cardiac silhouette.  Costophrenic angles are clear.  There
is central venous, congestion.
IMPRESSION: 1..  No significant interval change.
2.  Central venous, congestion.

## 2009-11-06 IMAGING — CR DG CHEST 2V
2 series · 2 of 2 positions shown · non-contrast
Comparison: Radiograph 11/02/2005

CLINICAL DATA: Dialysis, catheter pain

CHEST - 2 VIEW

[w chest pa]
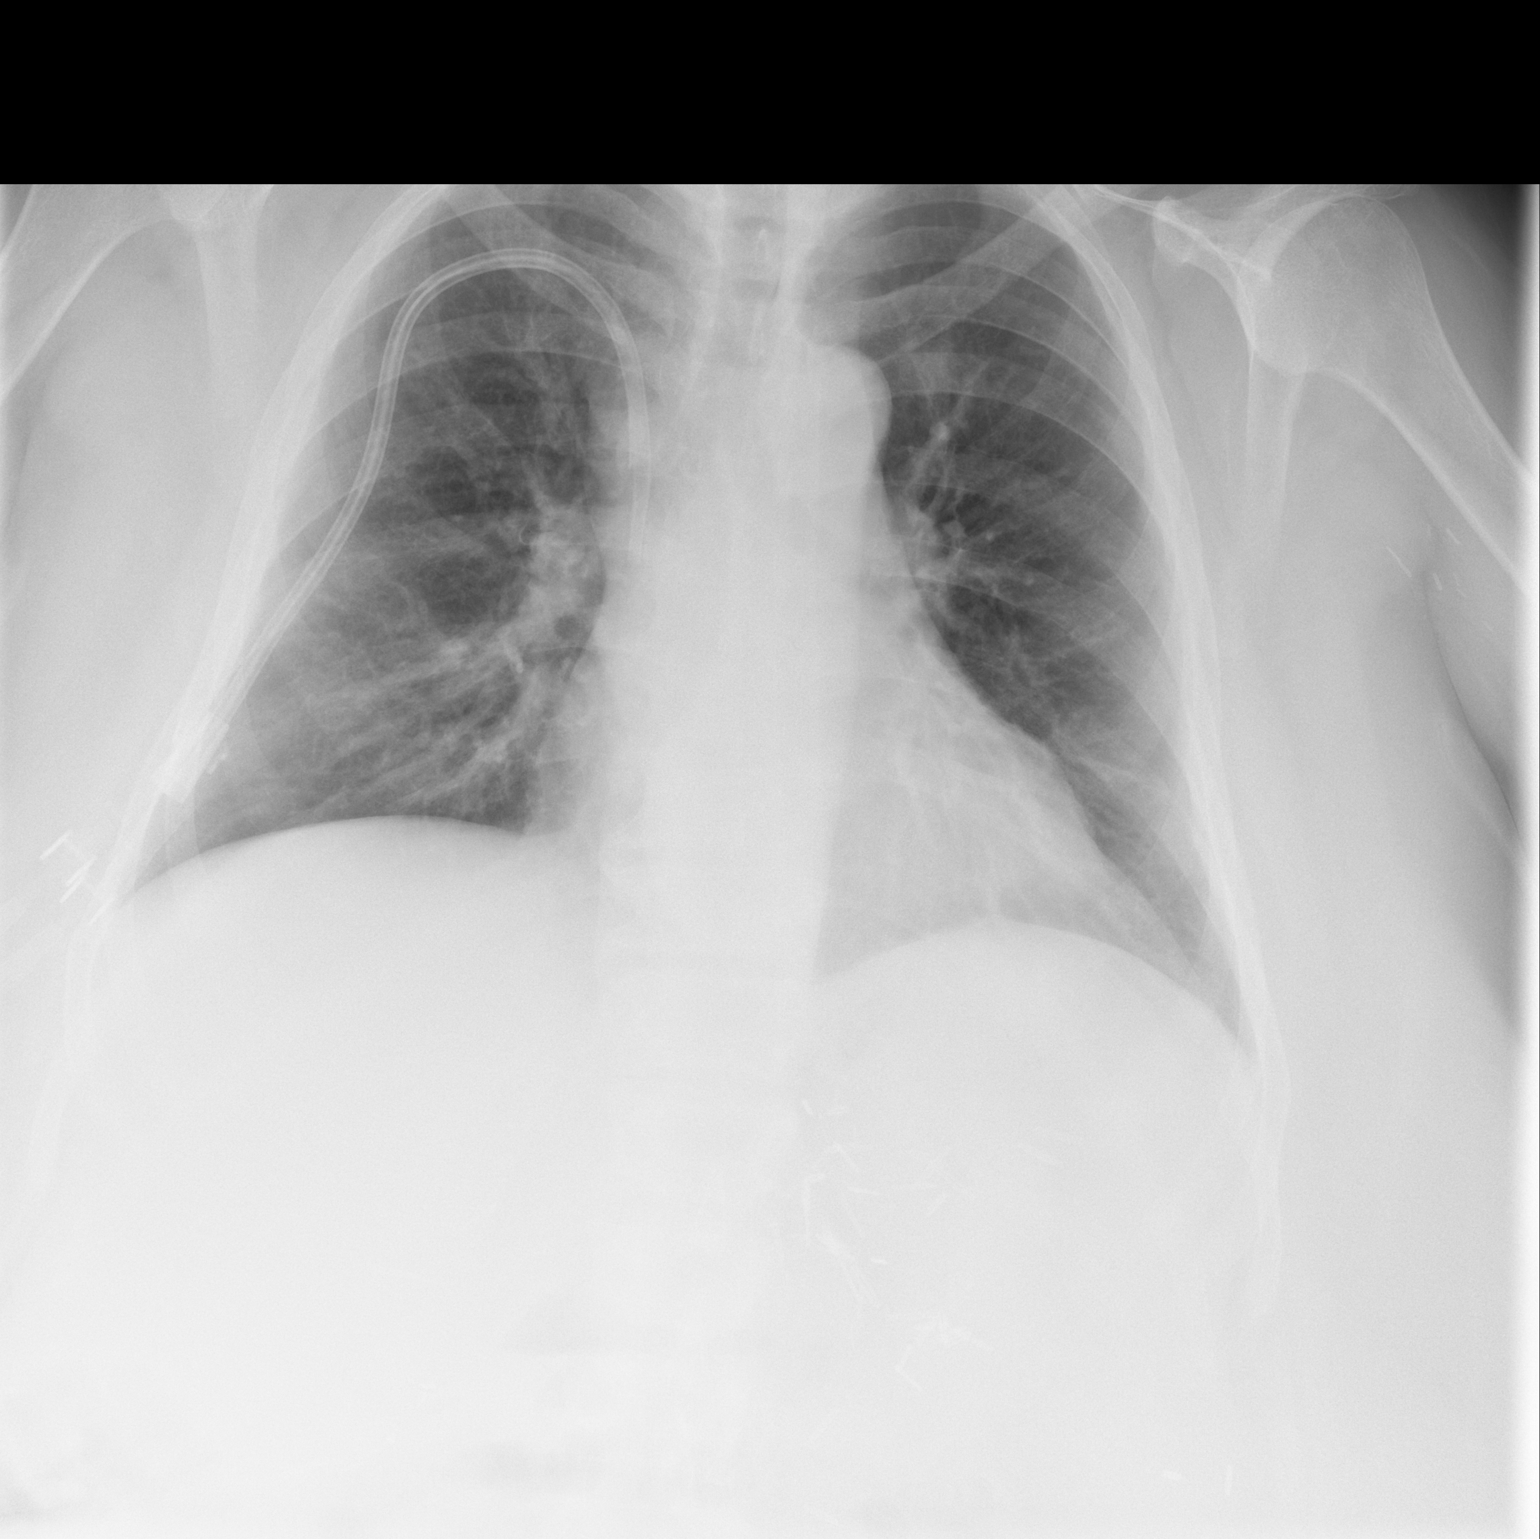

[w chest lat]
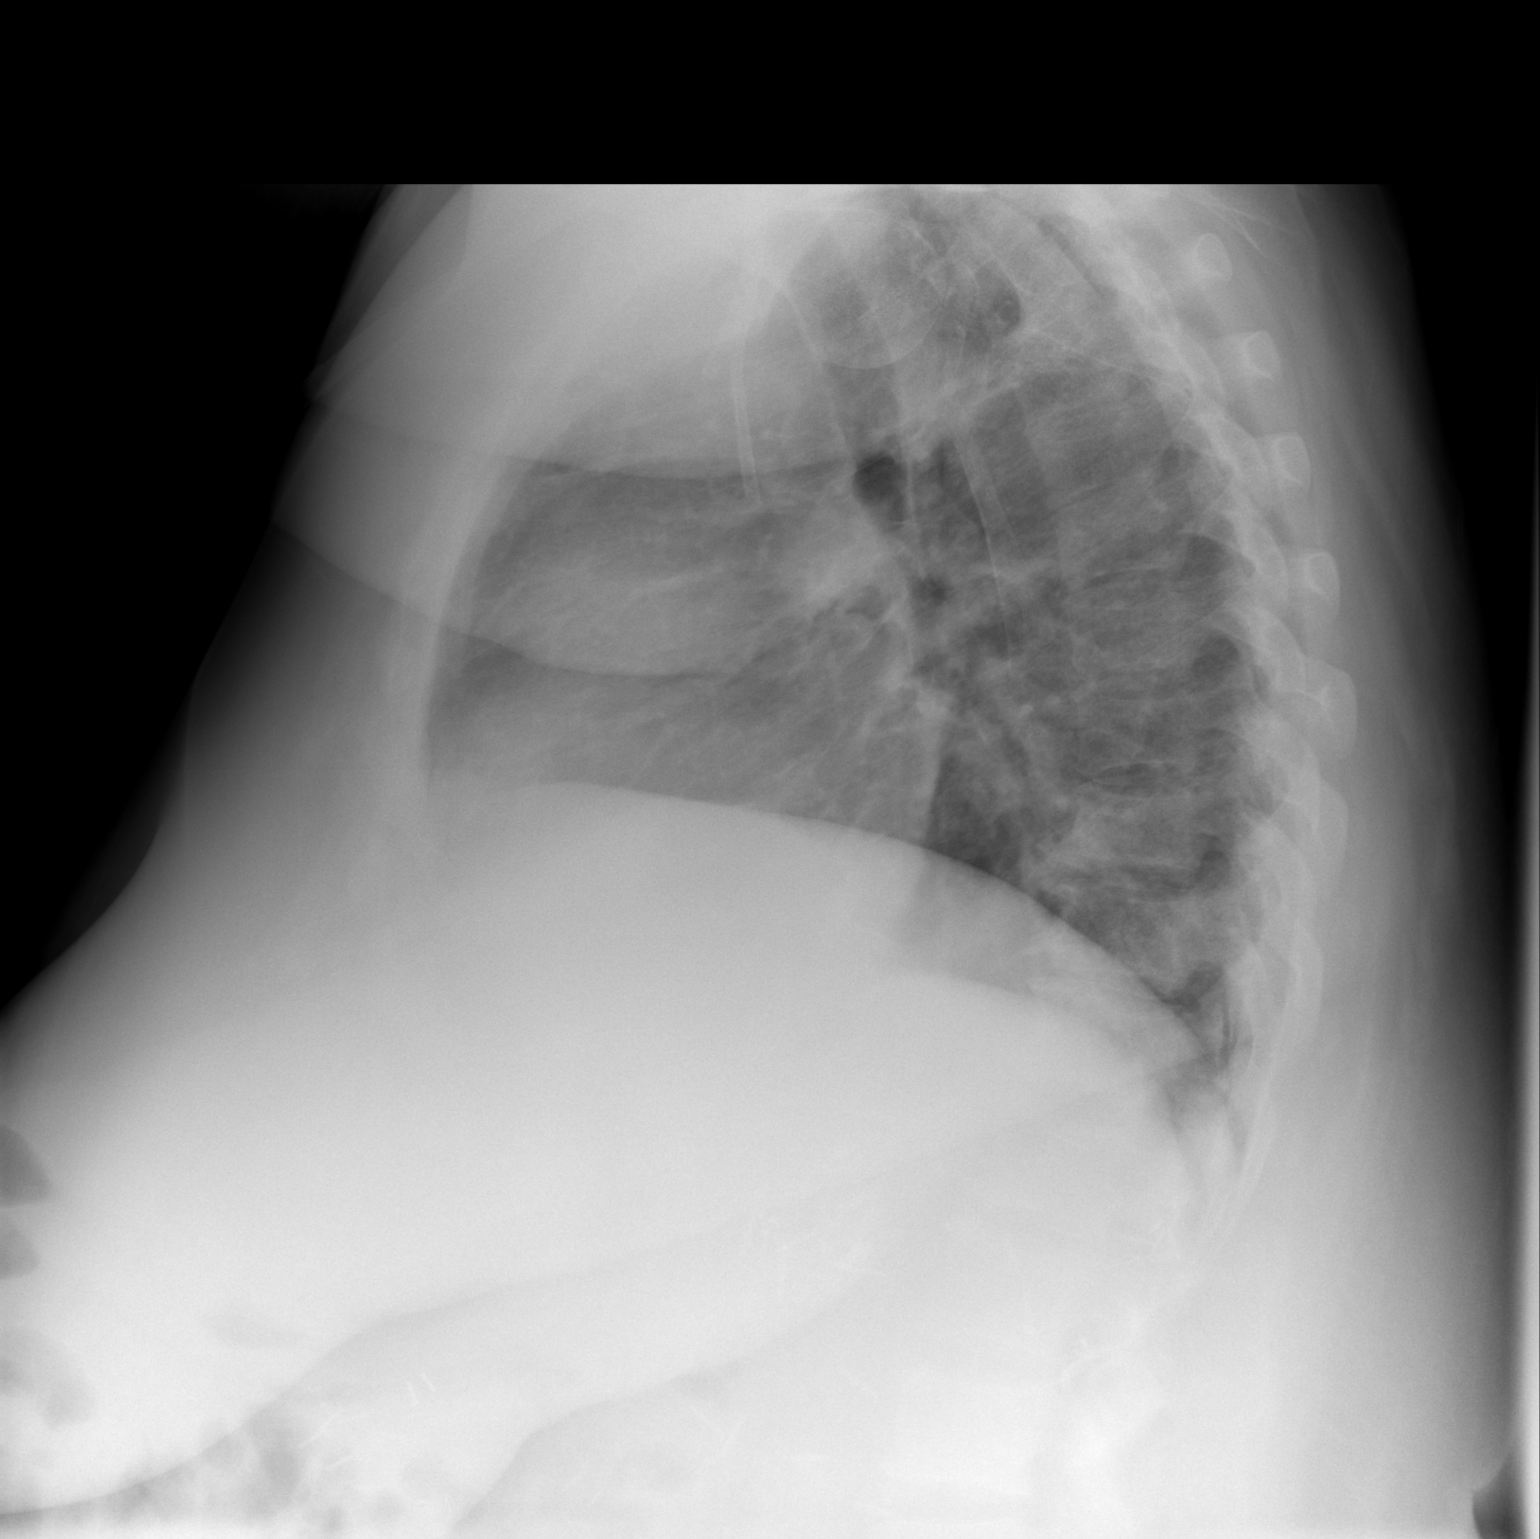

[2 of 2 positions shown; findings below may reference images not displayed]

FINDINGS: Right-sided dialysis catheter with split tips in the
distal SVC.  Stable cardiac silhouette.  There is increase in
central venous pulmonary congestion compared to prior.  No evidence
of focal infiltrate.  No pleural effusions.  No pneumothorax.
IMPRESSION: 1..  Interval mild increase in central venous, congestion suggests
volume overload.

## 2009-11-07 ENCOUNTER — Ambulatory Visit: Payer: Self-pay | Admitting: Cardiology

## 2009-11-07 ENCOUNTER — Inpatient Hospital Stay (HOSPITAL_COMMUNITY): Admission: EM | Admit: 2009-11-07 | Discharge: 2009-11-11 | Payer: Self-pay | Admitting: Emergency Medicine

## 2009-11-10 ENCOUNTER — Encounter (INDEPENDENT_AMBULATORY_CARE_PROVIDER_SITE_OTHER): Payer: Self-pay | Admitting: Nephrology

## 2009-11-17 ENCOUNTER — Encounter: Payer: Self-pay | Admitting: Internal Medicine

## 2009-11-25 IMAGING — CR DG CHEST 1V PORT
1 series · 1 of 1 positions shown · non-contrast
Comparison: 11/06/2007

CLINICAL DATA: Weakness, pain and dialysis catheter

PORTABLE CHEST - 1 VIEW

[AP]
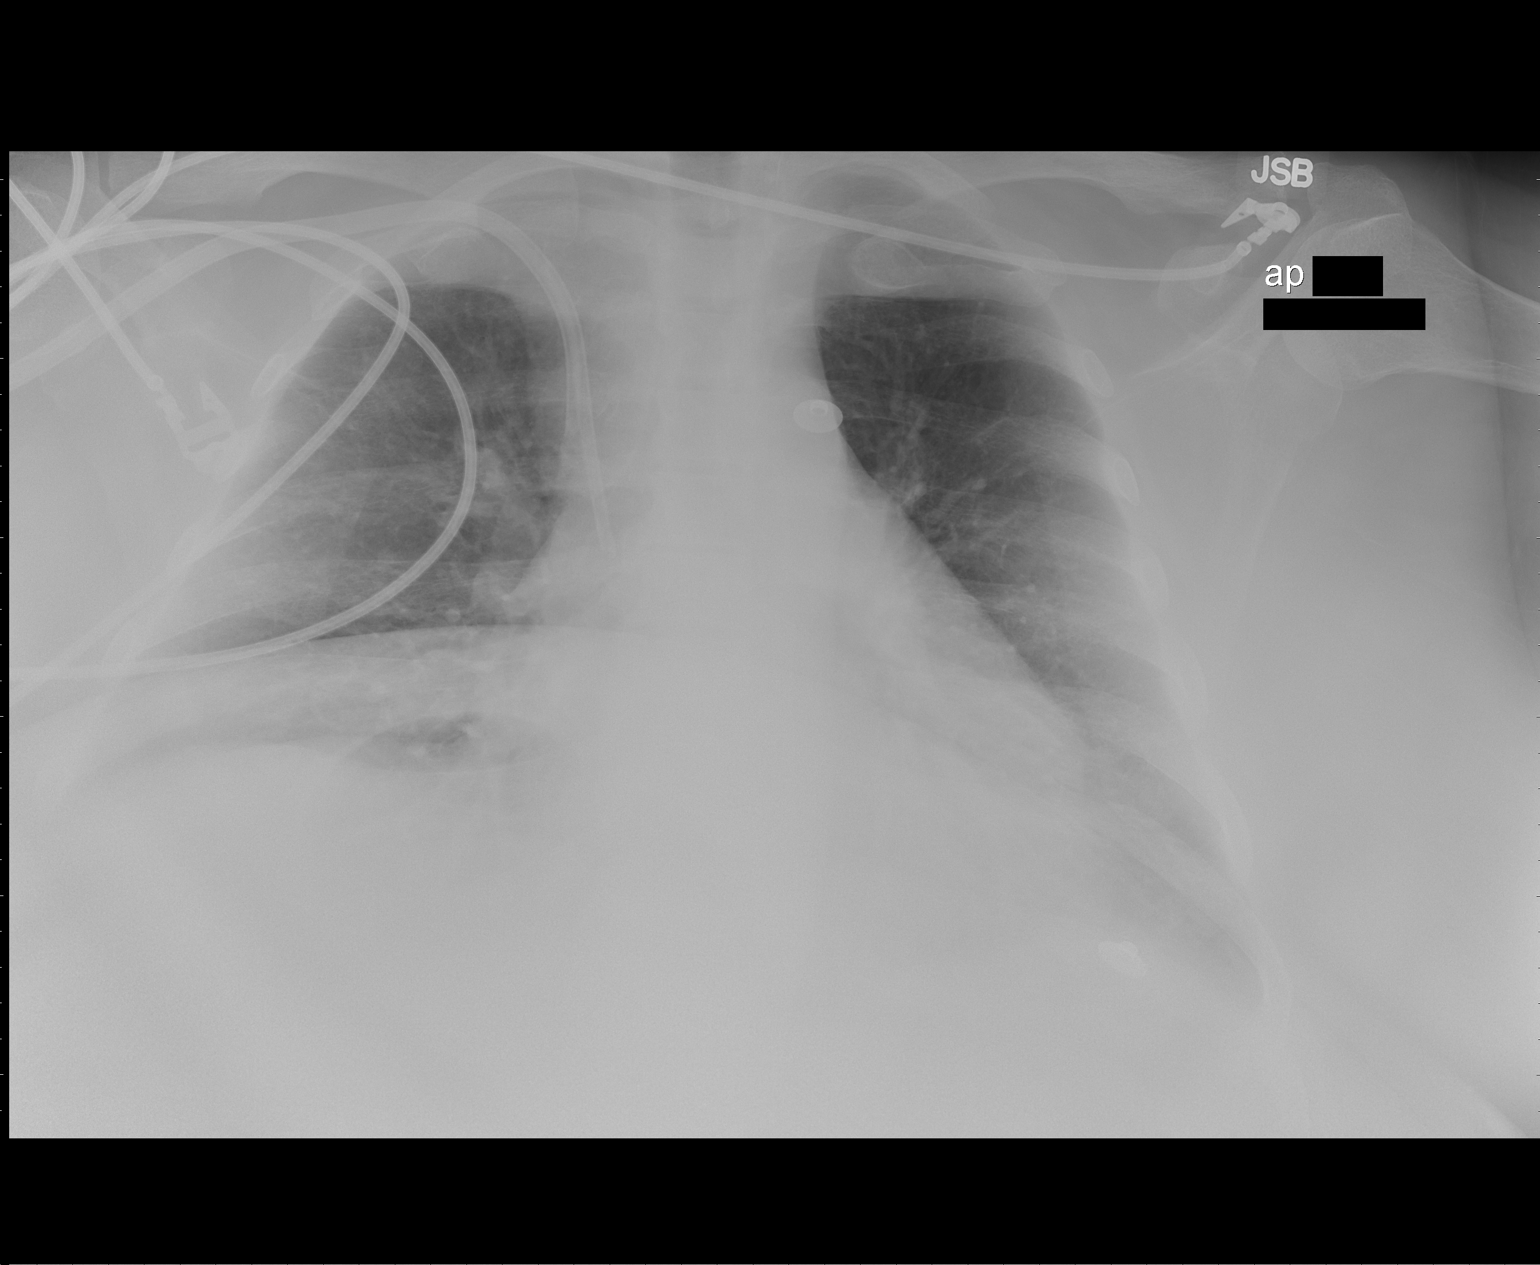

[1 of 1 positions shown; findings below may reference images not displayed]

FINDINGS: Right dialysis catheter remains in stable position with
the tips in the SVC.  There is cardiomegaly.  Low lung volumes
present with mild vascular congestion.  Suspect bibasilar
atelectasis.
IMPRESSION: Low lung volumes, cardiomegaly, vascular congestion, and bibasilar
atelectasis.

## 2009-12-04 ENCOUNTER — Inpatient Hospital Stay (HOSPITAL_COMMUNITY): Admission: EM | Admit: 2009-12-04 | Discharge: 2009-12-08 | Payer: Self-pay | Admitting: Emergency Medicine

## 2009-12-11 IMAGING — CR DG CHEST 1V PORT
1 series · 1 of 1 positions shown · non-contrast
Comparison: 11/25/2007

CLINICAL DATA: Dialysis patient/weakness

PORTABLE CHEST - 1 VIEW

[AP]
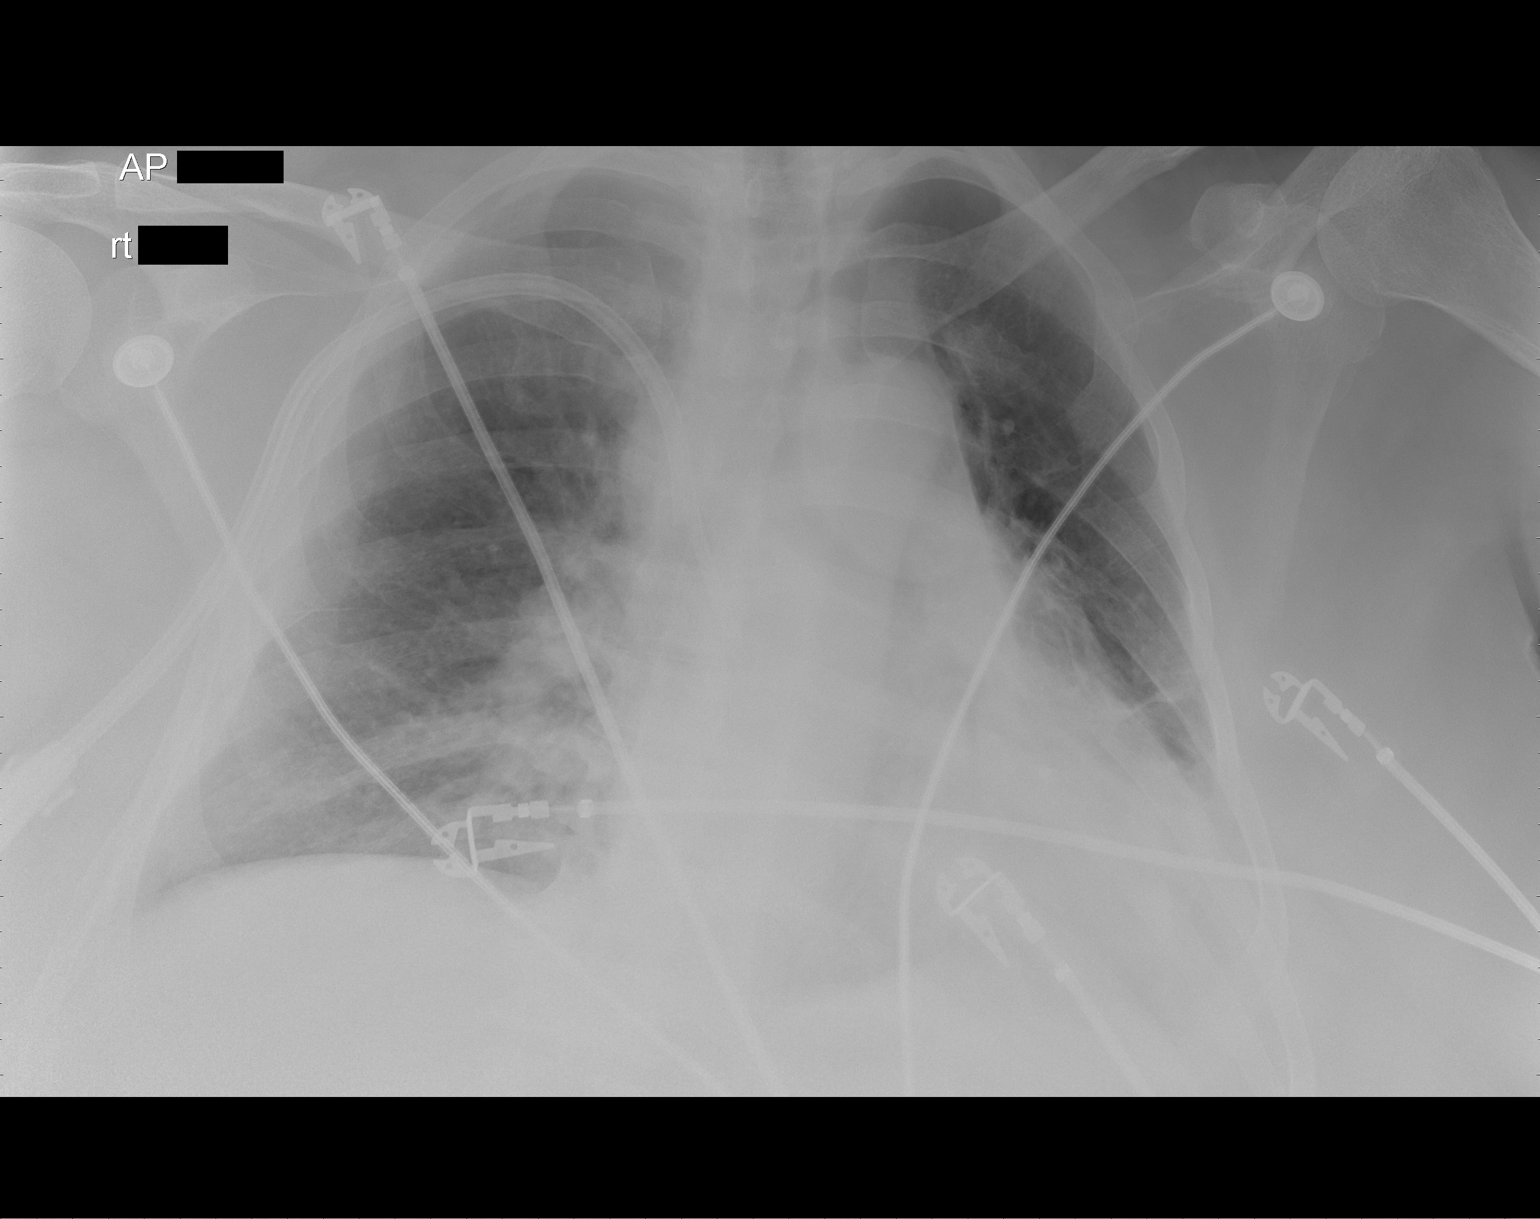

[1 of 1 positions shown; findings below may reference images not displayed]

FINDINGS: Heart enlarged.  Probable mild vascular congestion but no
frank congestive heart failure or pneumonia in one-view.  Dialysis
catheter unchanged.
IMPRESSION: 1.  Cardiomegaly with possible mild congestive heart failure.
2.  No definite pneumonia.

## 2009-12-13 ENCOUNTER — Telehealth: Payer: Self-pay | Admitting: Internal Medicine

## 2009-12-15 ENCOUNTER — Encounter: Payer: Self-pay | Admitting: Internal Medicine

## 2009-12-18 ENCOUNTER — Inpatient Hospital Stay (HOSPITAL_COMMUNITY): Admission: EM | Admit: 2009-12-18 | Discharge: 2009-12-20 | Payer: Self-pay | Admitting: Emergency Medicine

## 2009-12-22 ENCOUNTER — Encounter: Payer: Self-pay | Admitting: Internal Medicine

## 2009-12-25 IMAGING — CR DG KNEE COMPLETE 4+V*R*
4 series · 4 of 4 positions shown · non-contrast
Comparison: None

CLINICAL DATA: Right knee pain

RIGHT KNEE - COMPLETE 4+ VIEW

[t knee ap right]
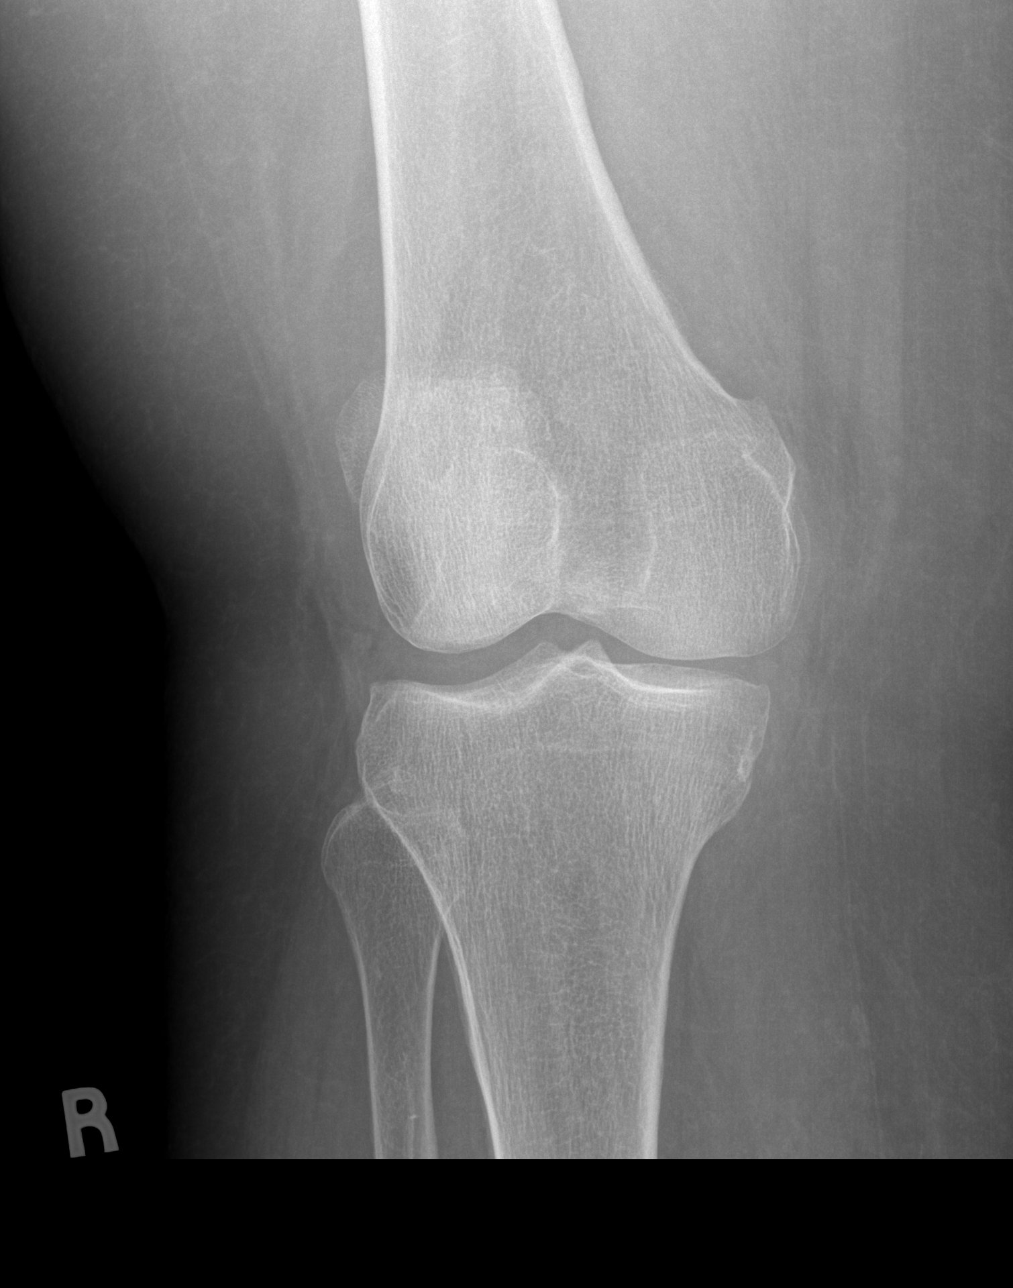

[t knee oblique right (1 of 2)]
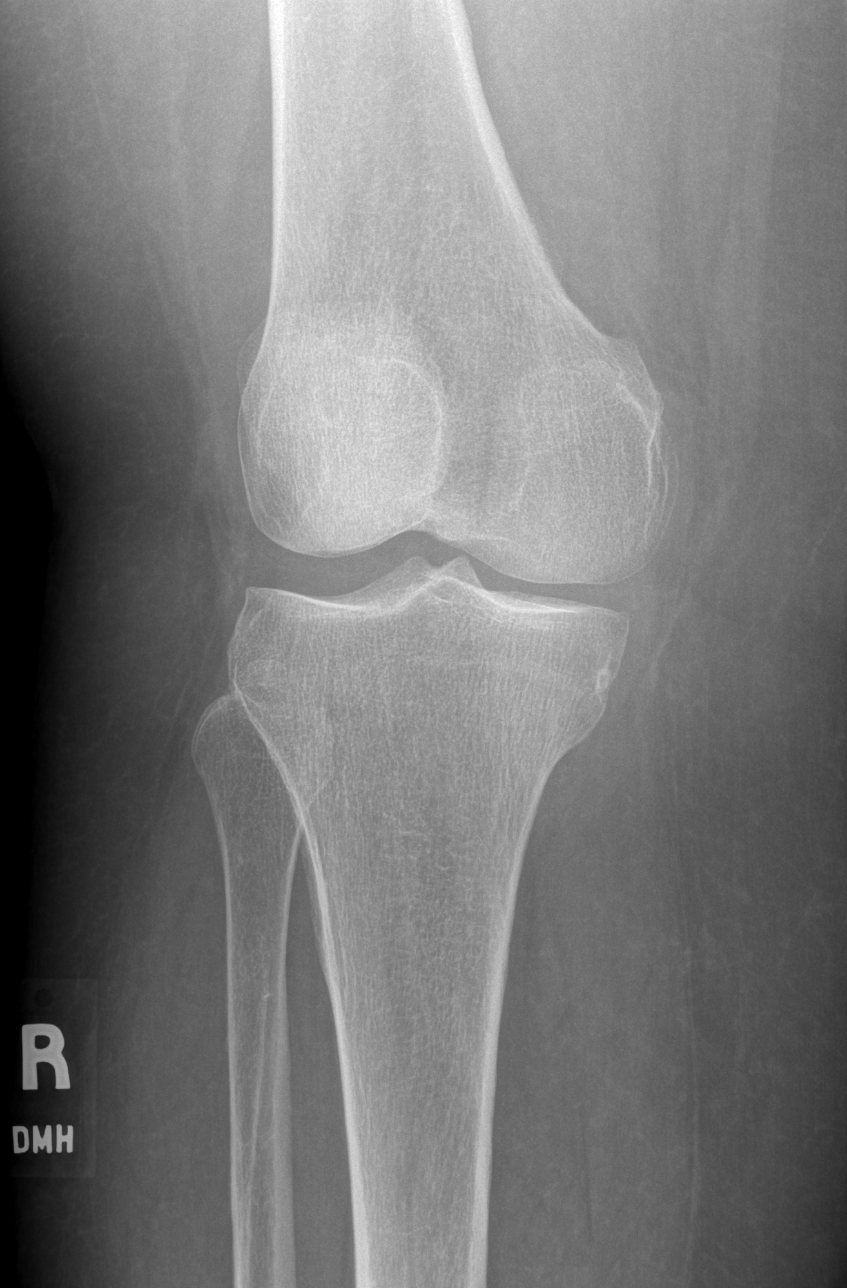

[t knee oblique right (2 of 2)]
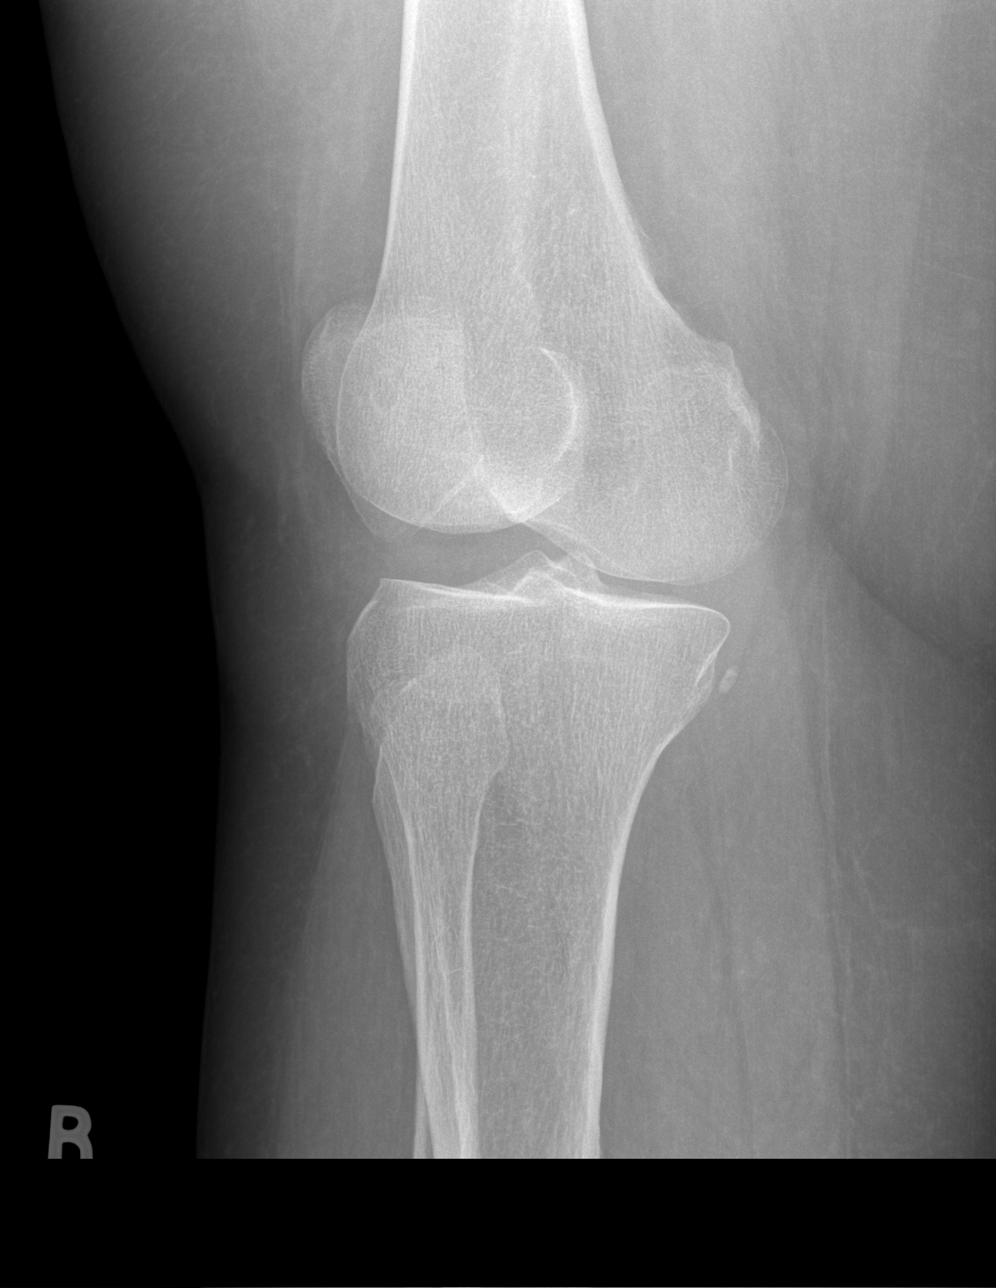

[t knee lat right]
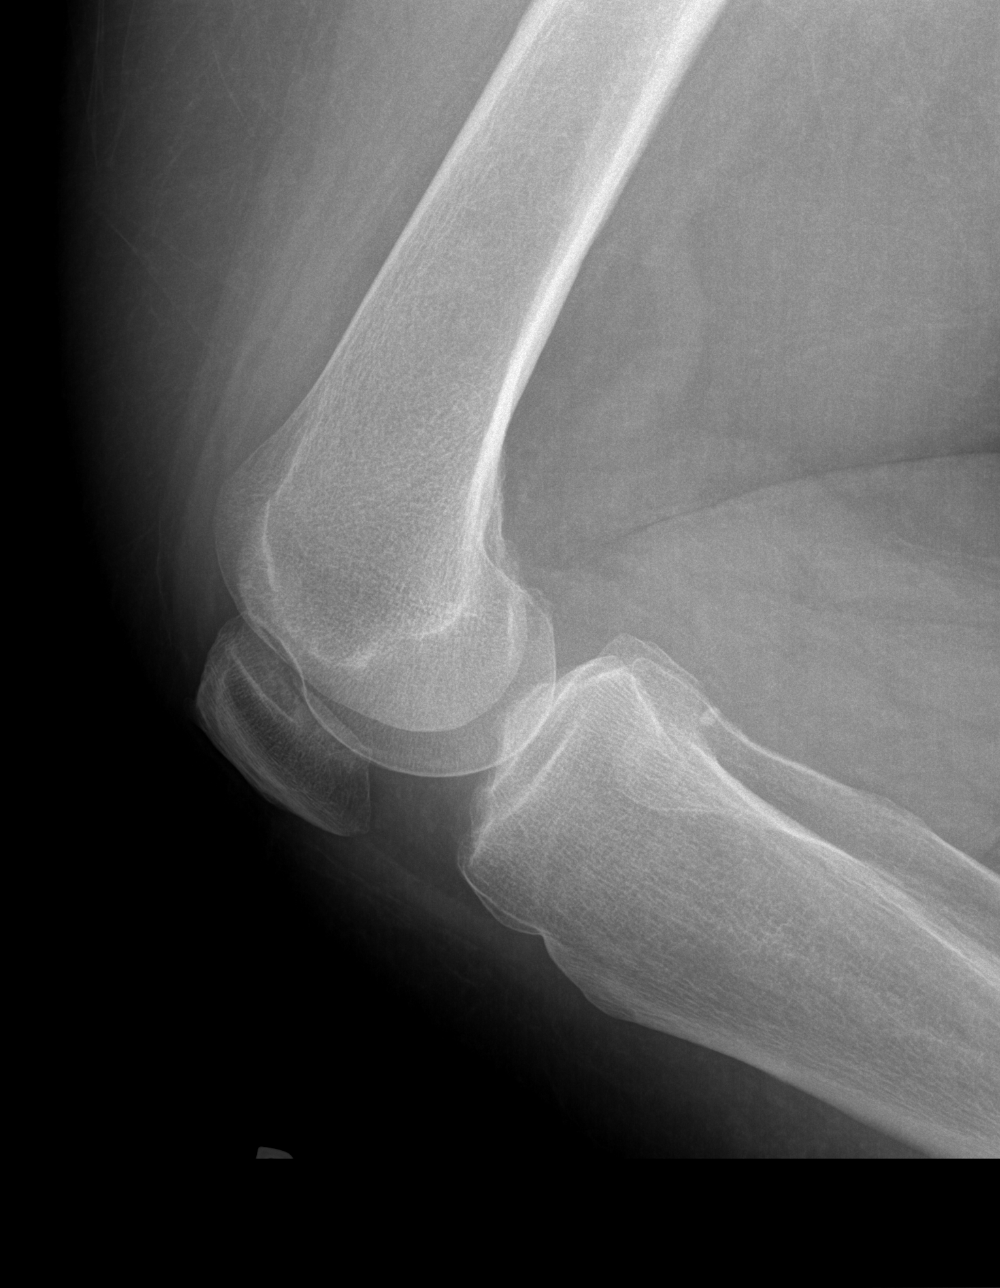

[4 of 4 positions shown; findings below may reference images not displayed]

FINDINGS: Four views of the right knee were obtained.  No evidence
for acute fracture, dislocation or joint effusion.  There is a
small loose body or ossification just medial to the proximal tibia.
IMPRESSION: No acute bony abnormalities to the right knee.

## 2009-12-27 IMAGING — CR DG CHEST 2V
2 series · 2 of 2 positions shown · non-contrast
Comparison: 12/11/2007

CLINICAL DATA: Missed dialysis

CHEST - 2 VIEW

[w chest pa]
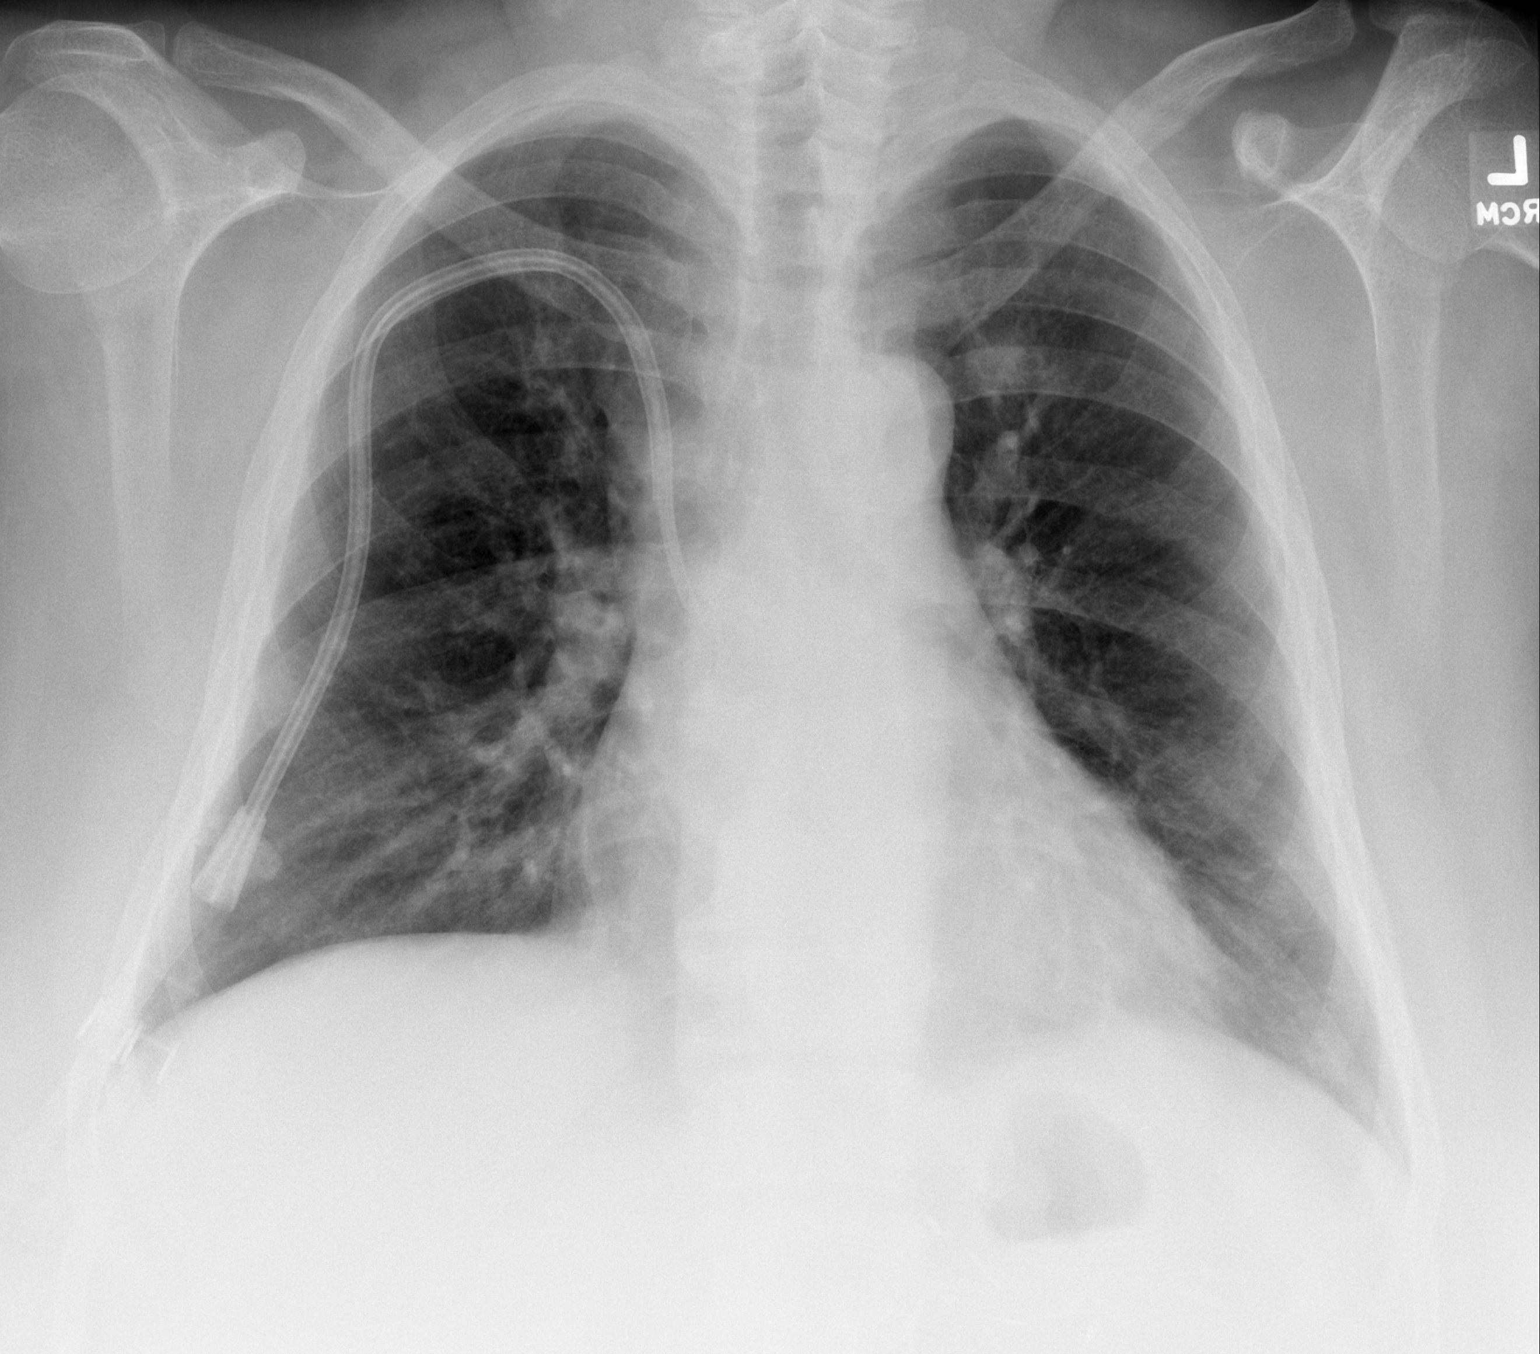

[w chest lat]
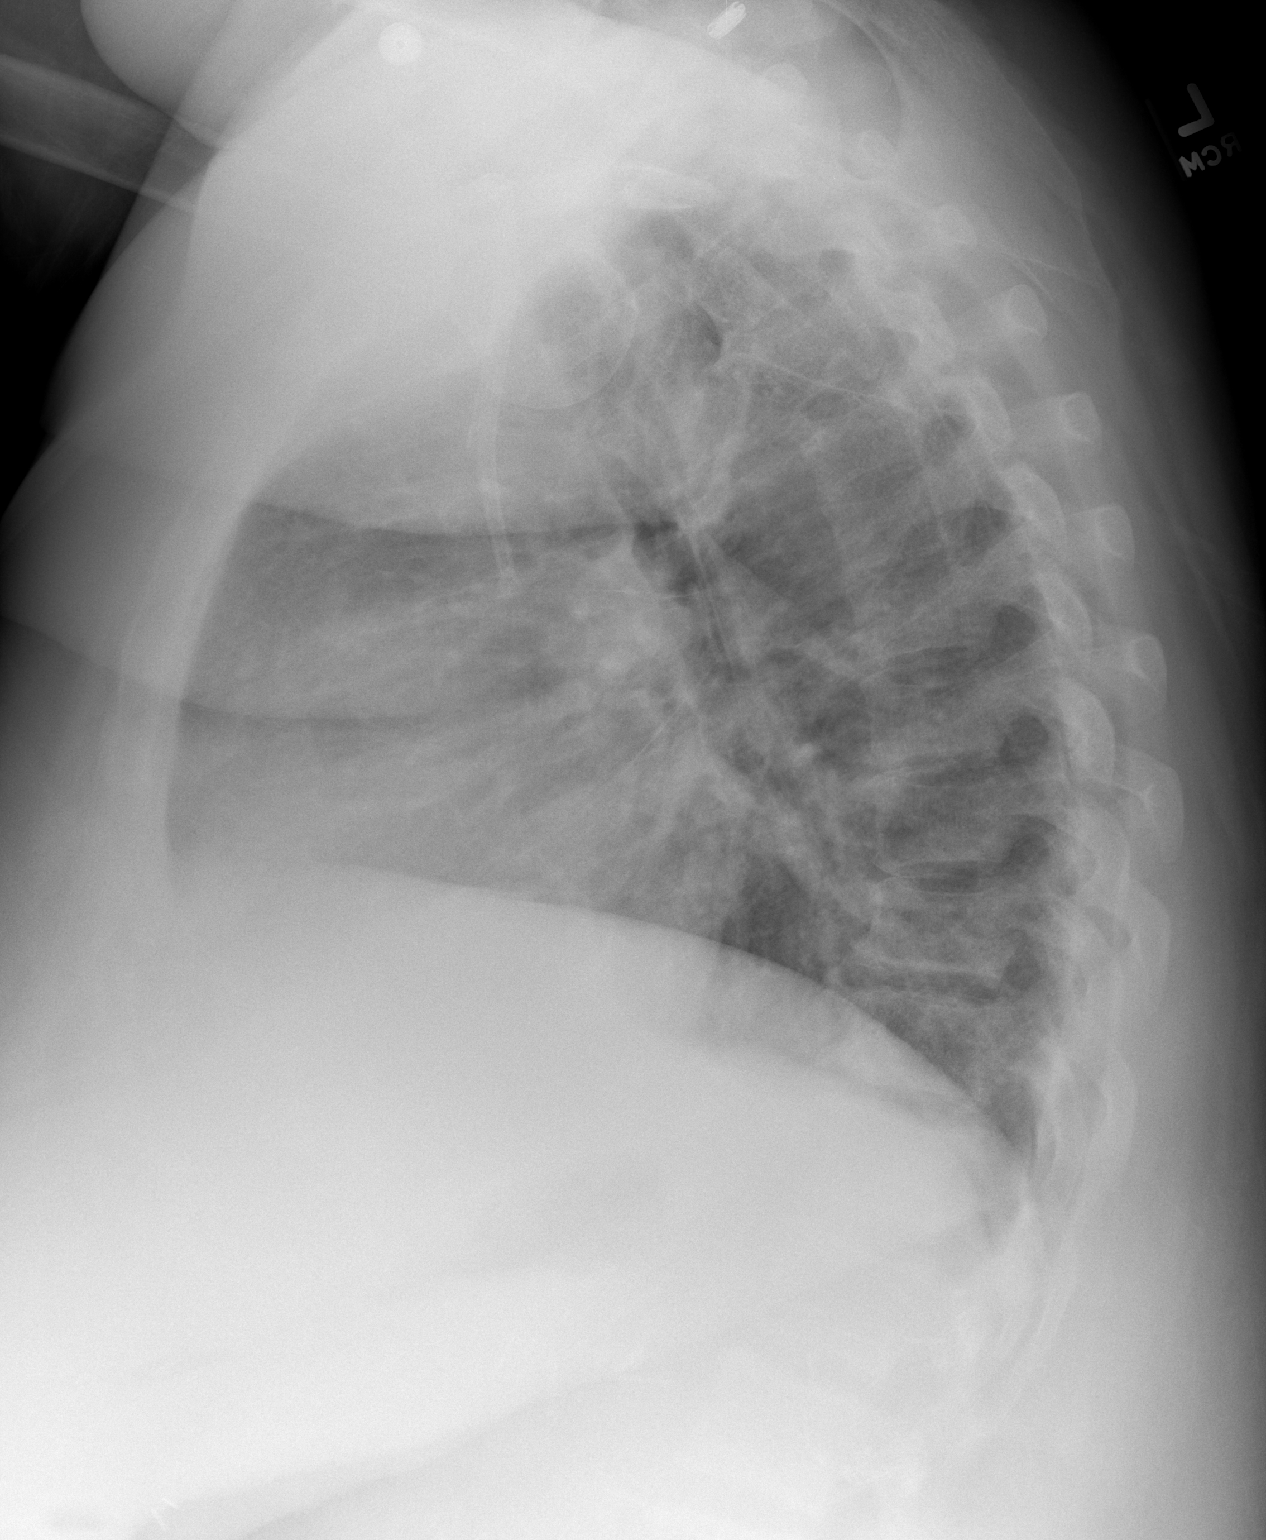

[2 of 2 positions shown; findings below may reference images not displayed]

FINDINGS: Right subclavian tunneled dialysis catheter stable in
position.  Heart size upper limits normal.  Minimal central
pulmonary vascular congestion.  No overt interstitial edema.  No
effusions.  Visualized bones unremarkable.
IMPRESSION: 1.  Mild central pulmonary vascular congestion.
2.  Stable dialysis catheter

## 2010-01-01 IMAGING — CR DG CHEST 2V
2 series · 2 of 2 positions shown · non-contrast
Comparison: 12/27/2007

CLINICAL DATA: Chest pain and dyspnea.  Dialysis patient.

CHEST - 2 VIEW

[w chest pa *]
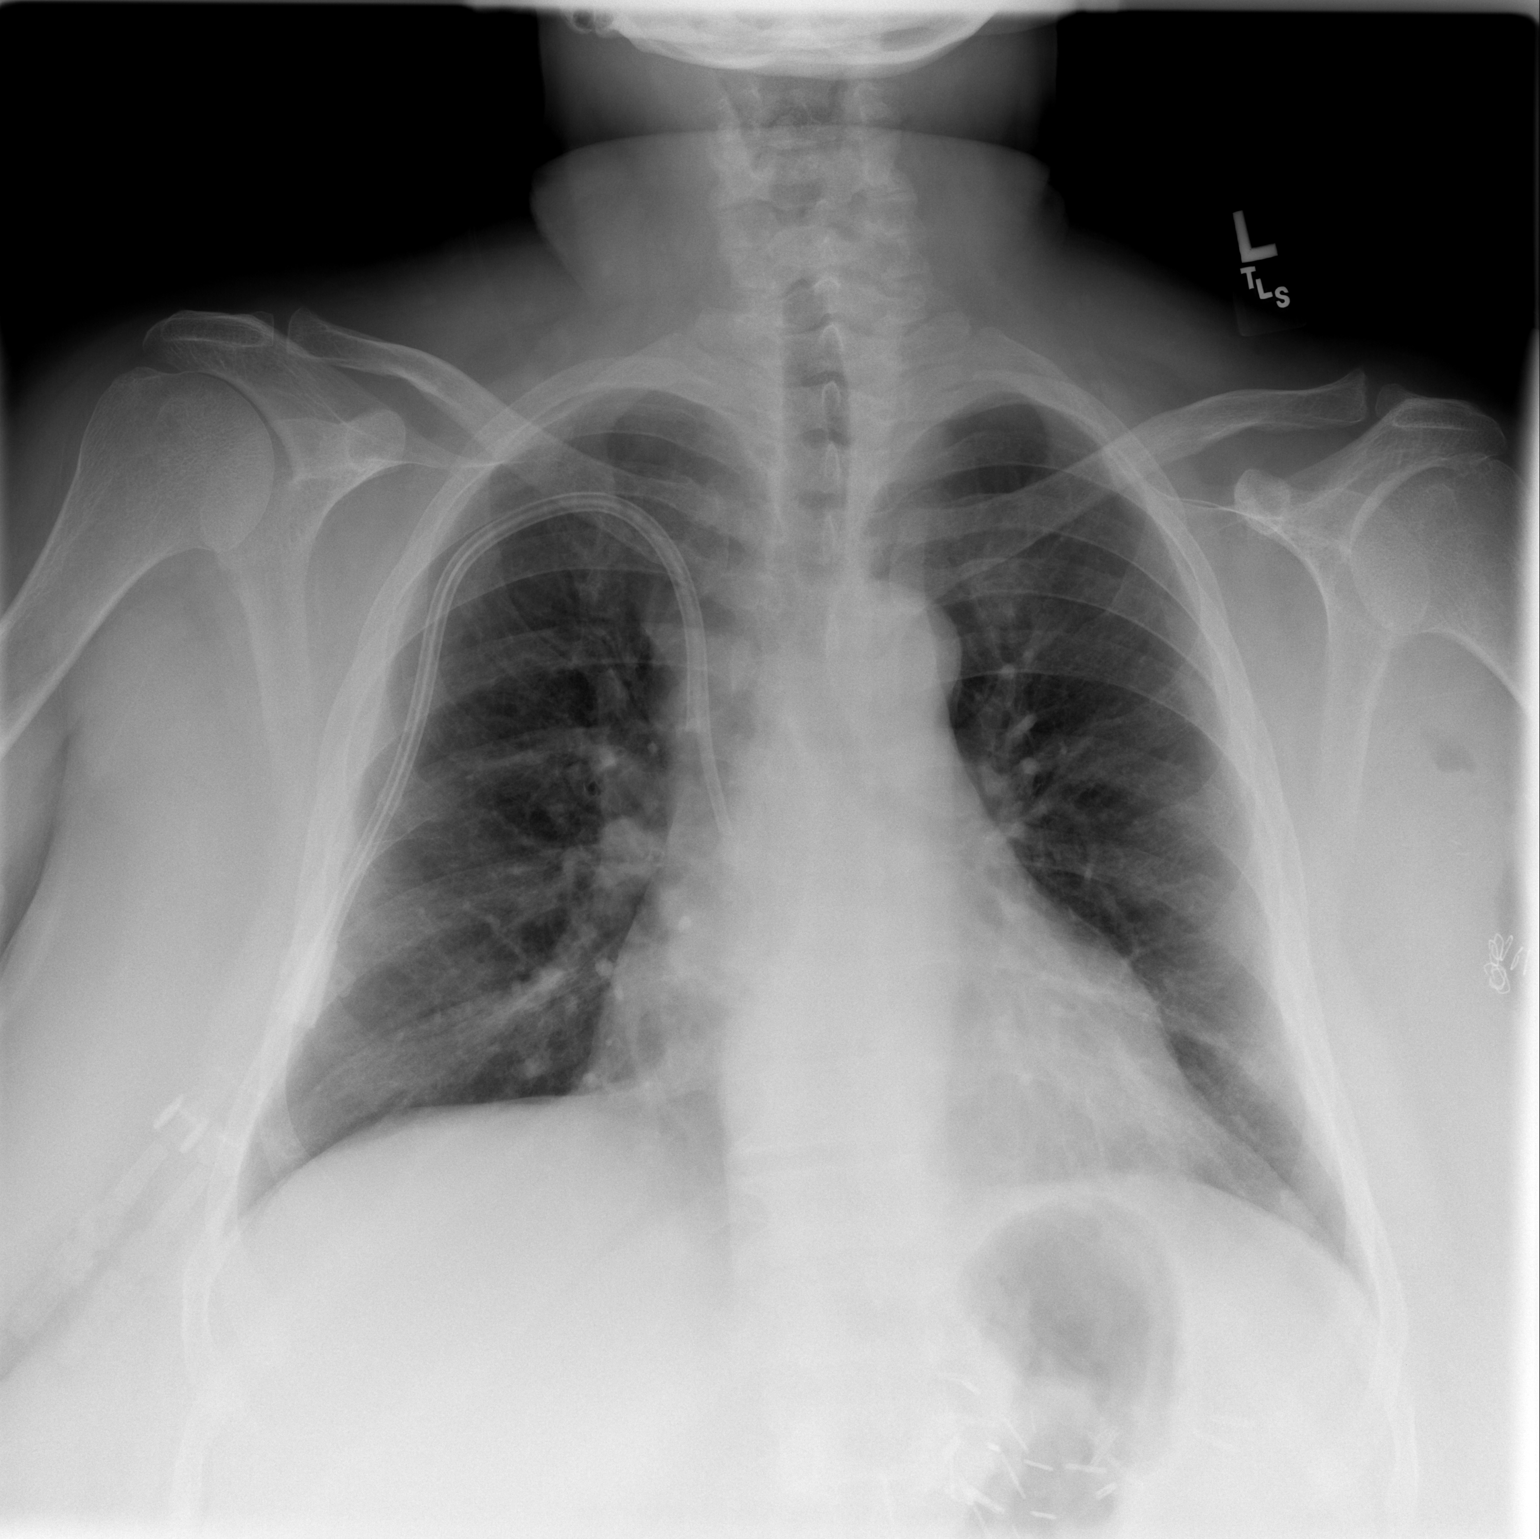

[w chest lat *]
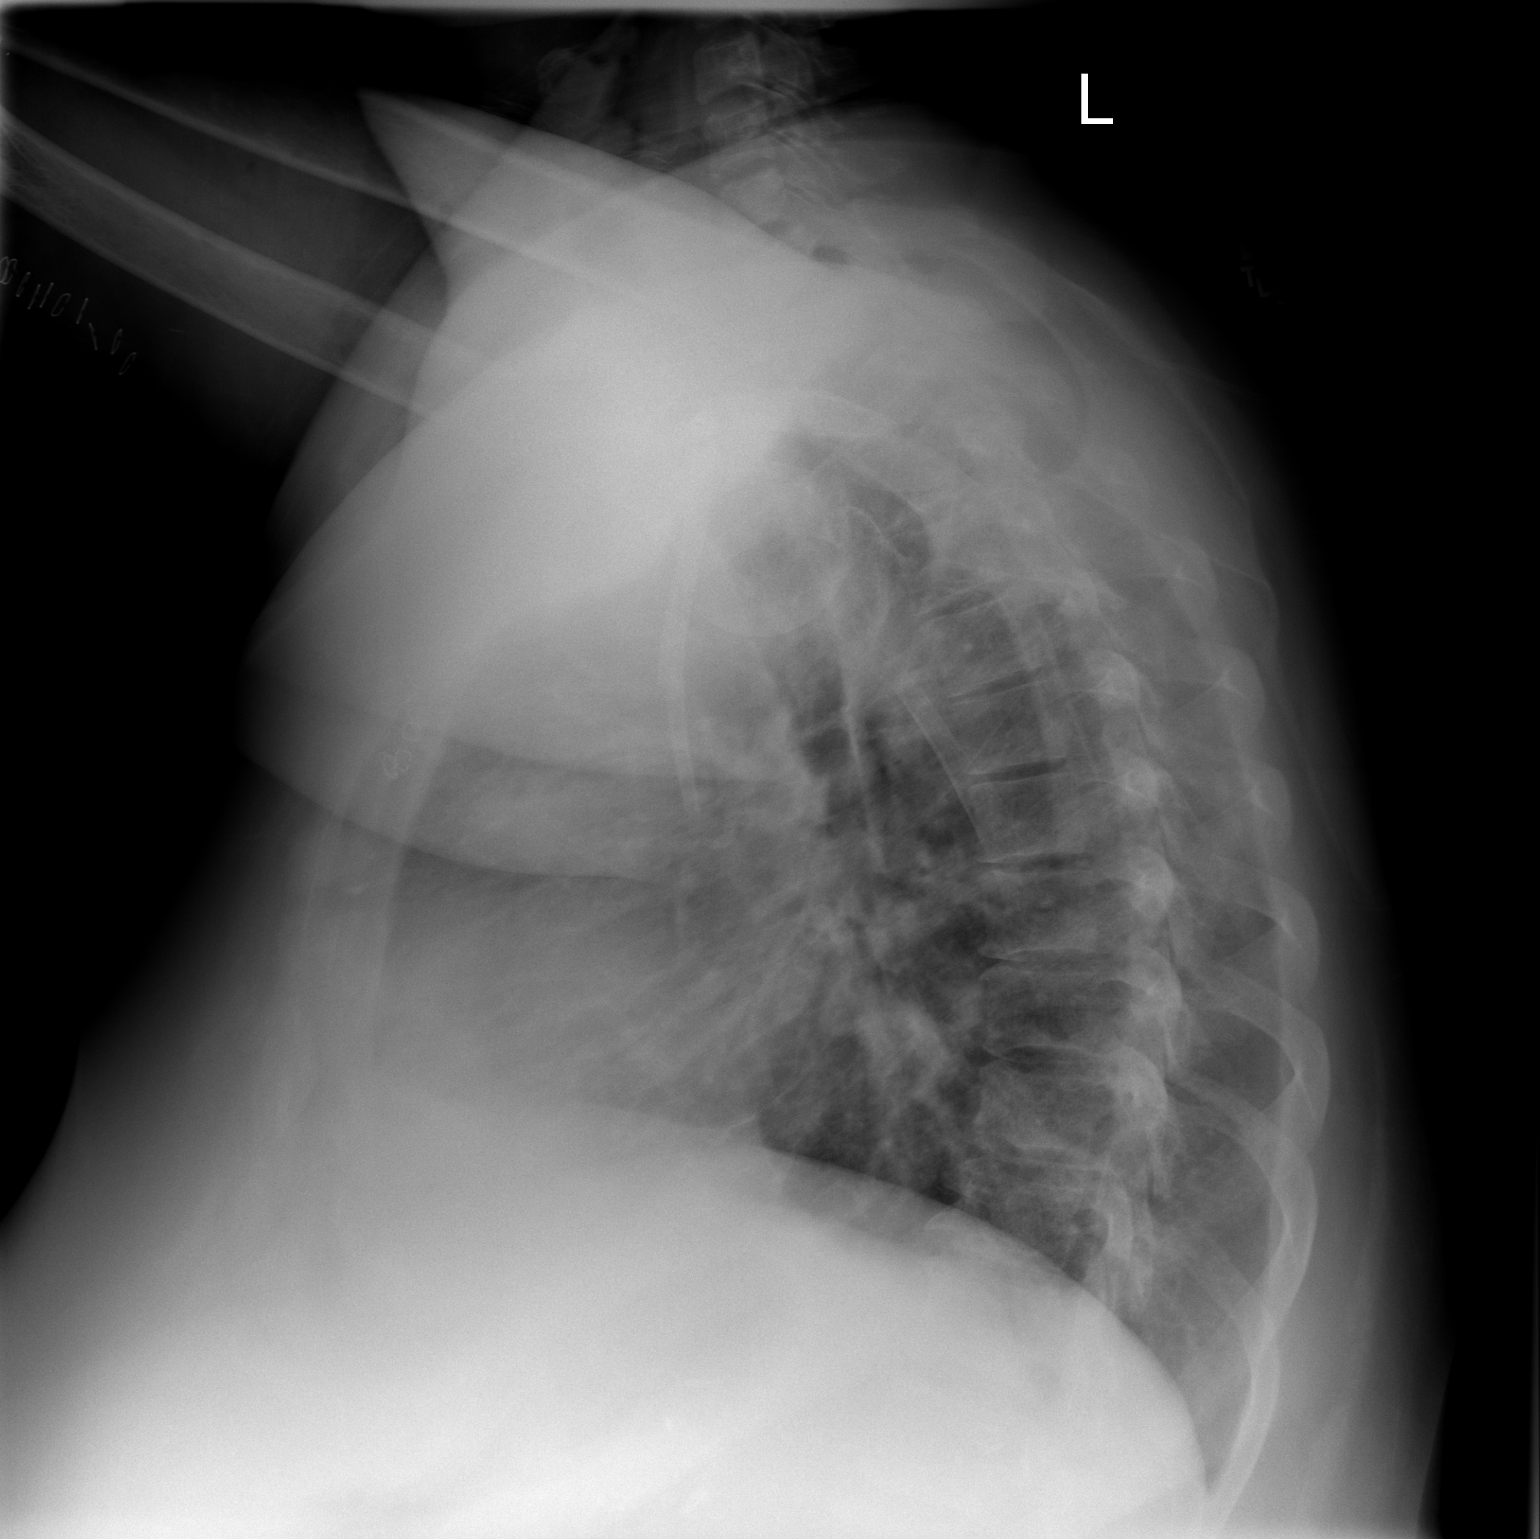

[2 of 2 positions shown; findings below may reference images not displayed]

FINDINGS: Mild motion artifact involves the lateral view.  Mild
thoracic spondylosis.  Right-sided dialysis catheter with tips at
mid and low SVC.  Extensive surgical changes of the left upper
abdomen. Midline trachea. Heart size at the upper limits of normal.
No pleural effusion or pneumothorax. Resolved pulmonary edema.
Diffuse peribronchial thickening.  Mild patchy opacity at the left
base likely scar or atelectasis.
IMPRESSION: 1. No acute cardiopulmonary disease.
2.  Borderline cardiomegaly with resolved pulmonary edema.

## 2010-01-13 ENCOUNTER — Emergency Department (HOSPITAL_COMMUNITY): Admission: EM | Admit: 2010-01-13 | Discharge: 2010-01-13 | Payer: Self-pay | Admitting: Emergency Medicine

## 2010-01-13 IMAGING — CR DG CHEST 2V
2 series · 2 of 2 positions shown · non-contrast
Comparison: 01/11/2008

CLINICAL DATA: Missed dialysis.  Swelling.  Asthma.  Shortness of
breath.  Chest pain since yesterday.  Kidney failure.  Nonsmoker.

CHEST - 2 VIEW

[w chest pa]
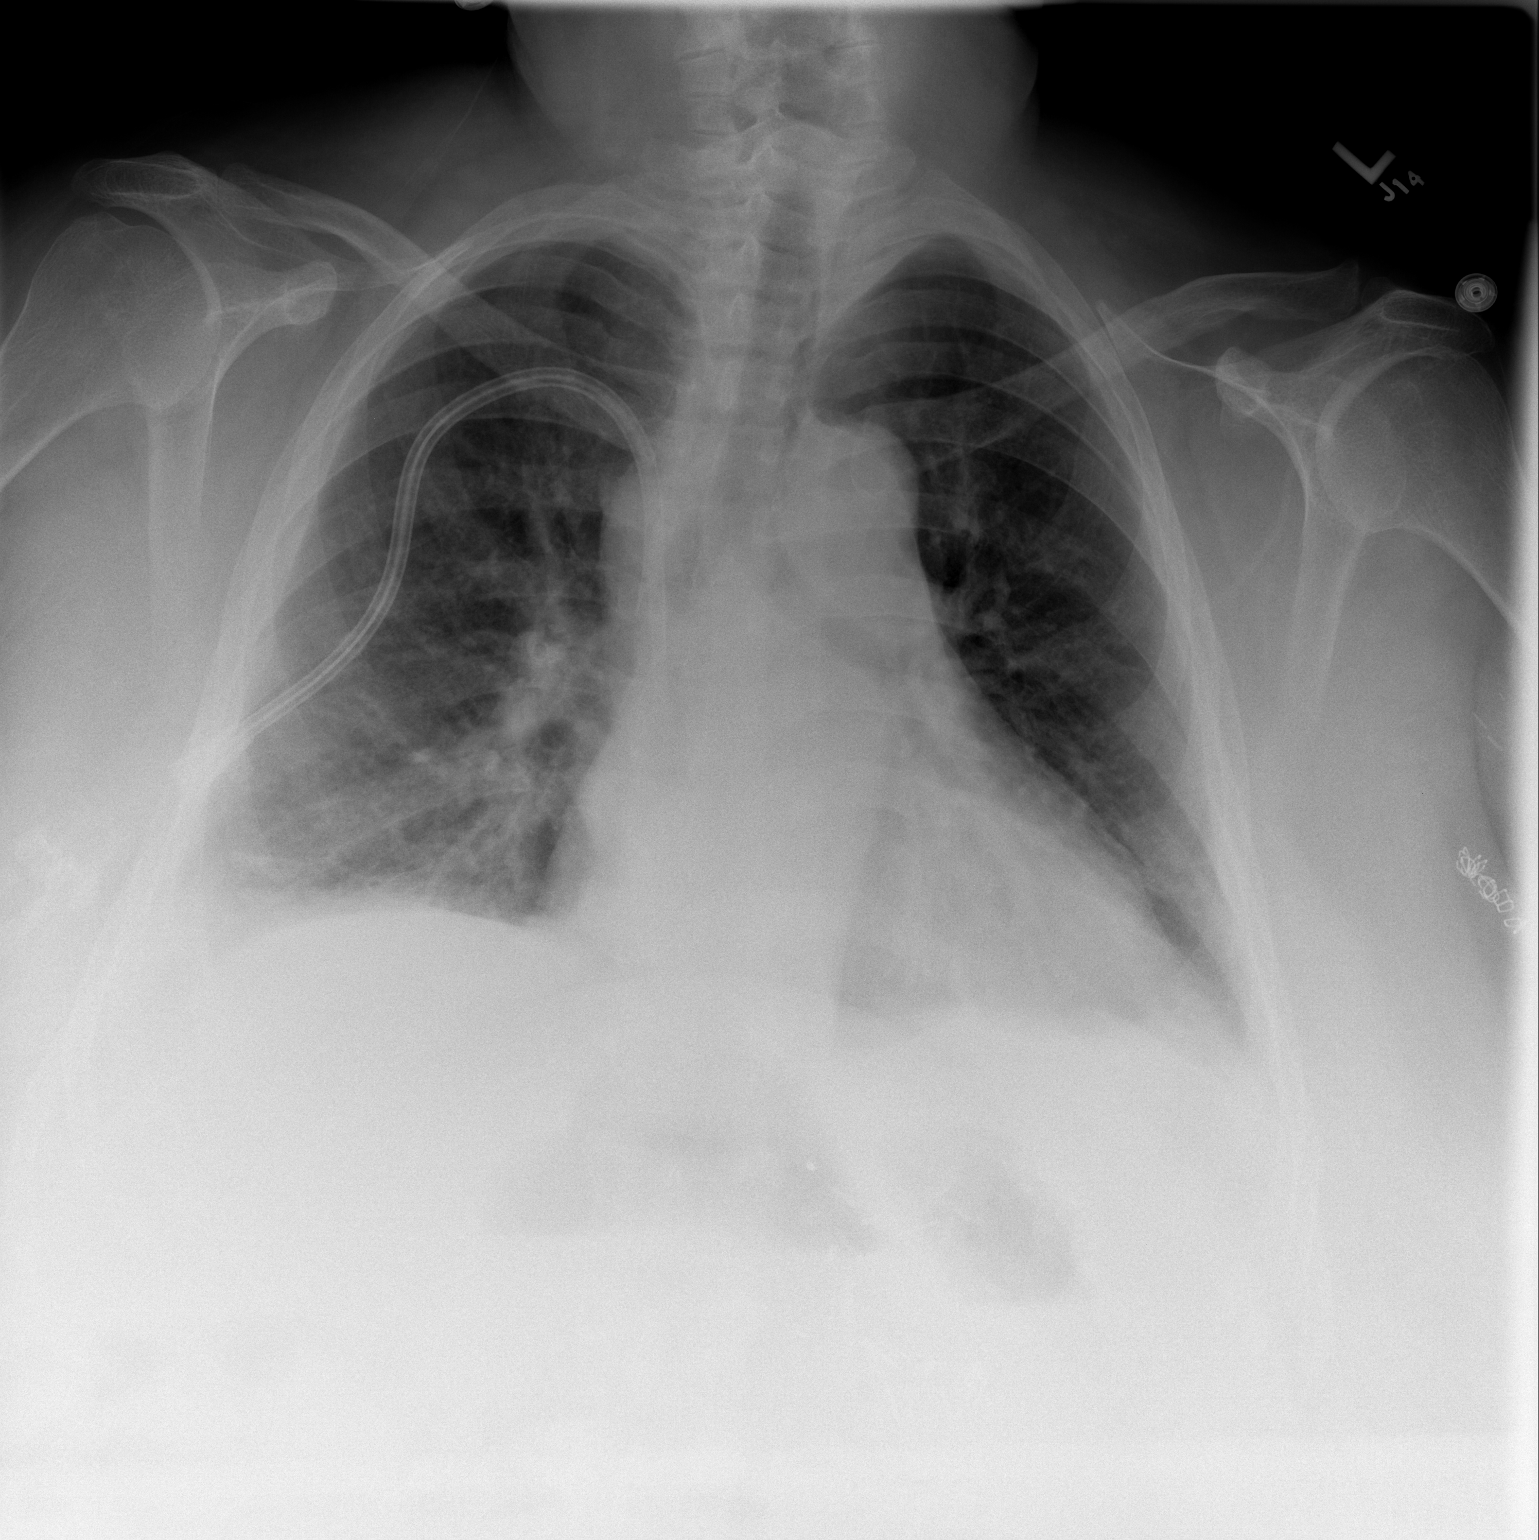

[w chest lat]
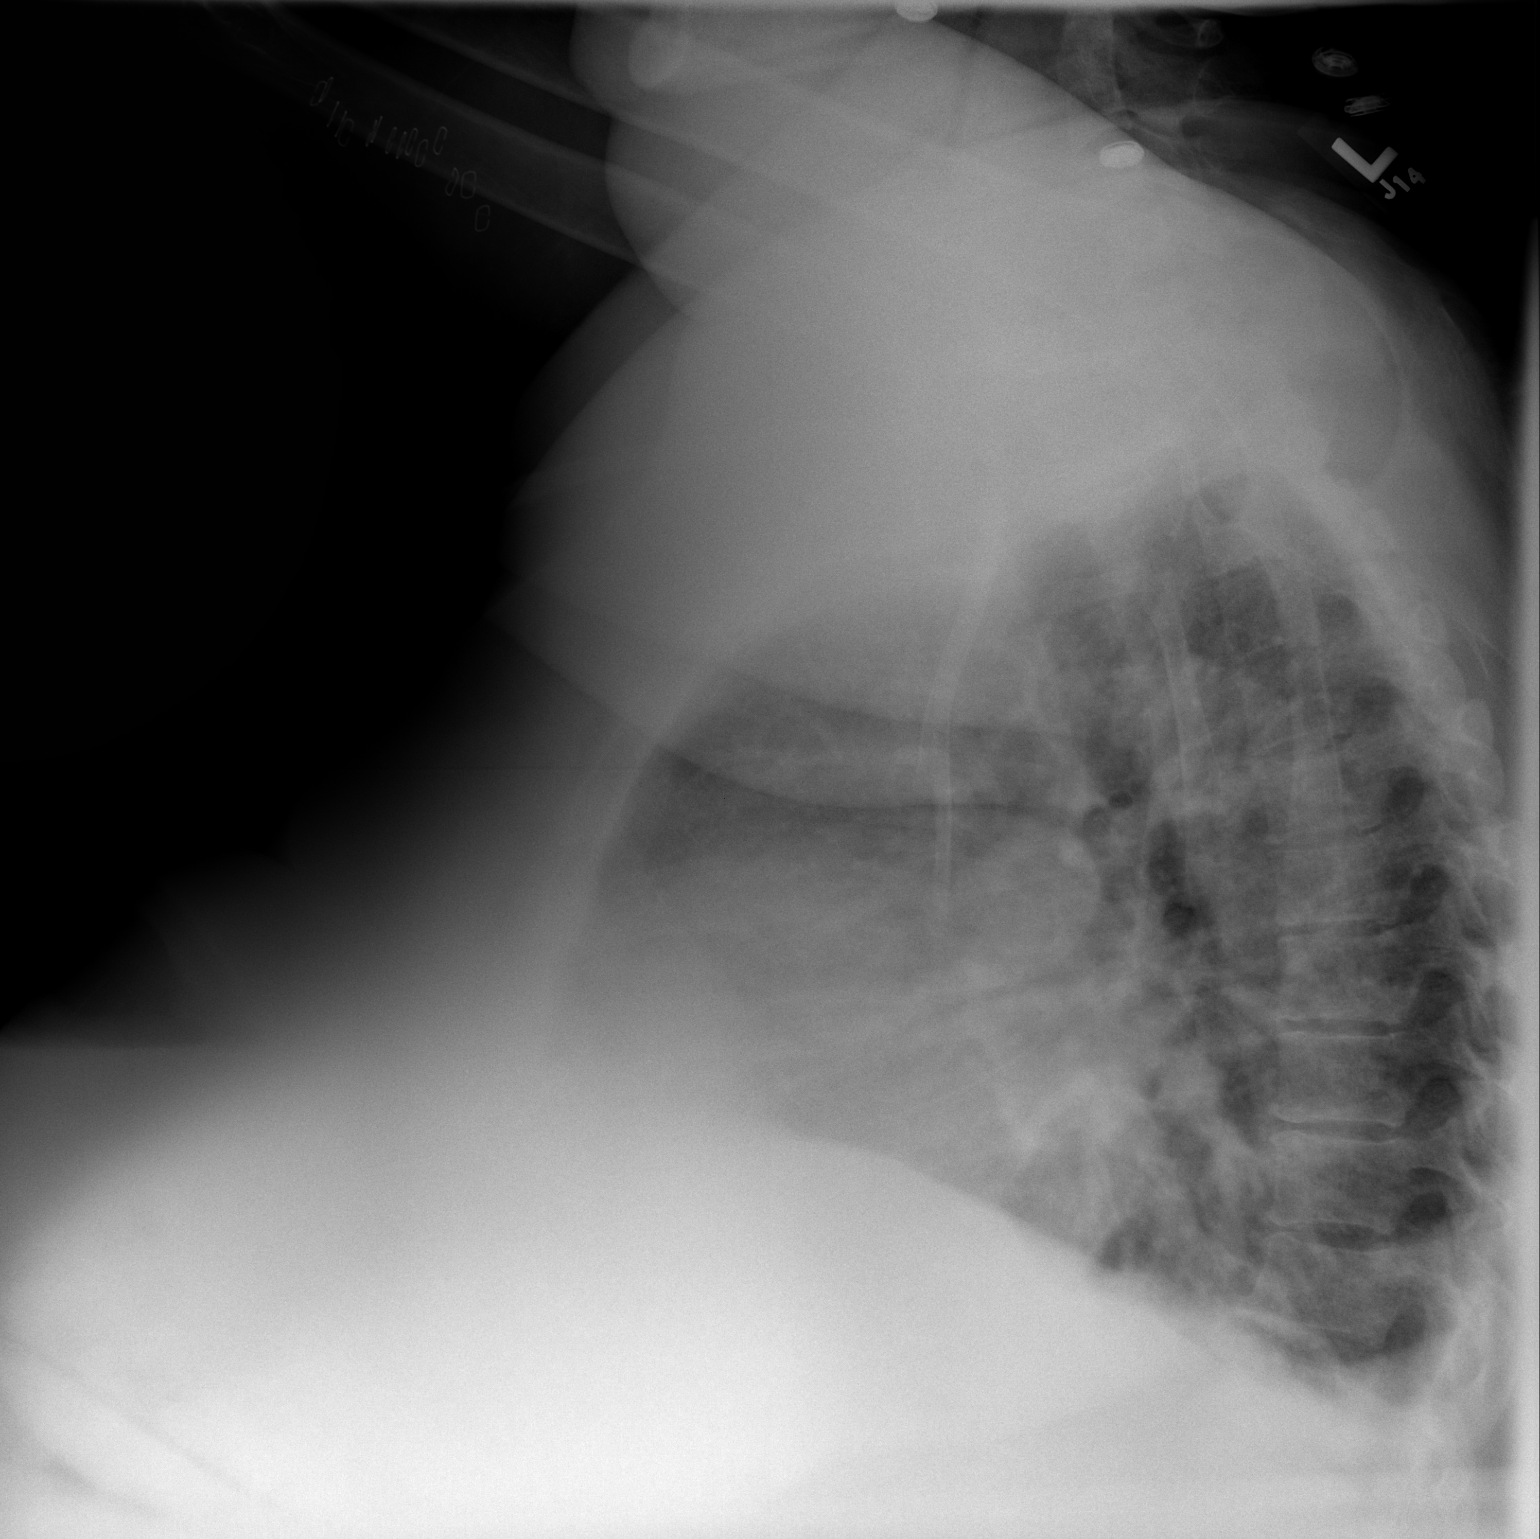

[2 of 2 positions shown; findings below may reference images not displayed]

FINDINGS: The patient has a right-sided dialysis catheter with tips
to the superior vena cava.  The heart is enlarged.  There is mild
interstitial edema.  More confluent density is seen at both lung
bases, consistent atelectasis or edema.  There are small bilateral
pleural effusions.  Surgical clips are seen within the upper
abdomen.
IMPRESSION: Cardiomegaly and mild interstitial edema.

## 2010-01-22 ENCOUNTER — Emergency Department (HOSPITAL_COMMUNITY): Admission: EM | Admit: 2010-01-22 | Discharge: 2010-01-23 | Payer: Self-pay | Admitting: Emergency Medicine

## 2010-01-24 ENCOUNTER — Ambulatory Visit: Payer: Self-pay | Admitting: Internal Medicine

## 2010-01-24 DIAGNOSIS — E875 Hyperkalemia: Secondary | ICD-10-CM

## 2010-01-25 ENCOUNTER — Encounter: Payer: Self-pay | Admitting: Internal Medicine

## 2010-01-25 LAB — CONVERTED CEMR LAB
BUN: 57 mg/dL — ABNORMAL HIGH (ref 6–23)
Calcium: 9.9 mg/dL (ref 8.4–10.5)
Creatinine, Ser: 7.3 mg/dL (ref 0.4–1.2)
GFR calc non Af Amer: 6.29 mL/min (ref >60)

## 2010-01-27 ENCOUNTER — Encounter: Payer: Self-pay | Admitting: Internal Medicine

## 2010-01-29 ENCOUNTER — Emergency Department (HOSPITAL_COMMUNITY): Admission: EM | Admit: 2010-01-29 | Discharge: 2010-01-30 | Payer: Self-pay | Admitting: Emergency Medicine

## 2010-02-05 ENCOUNTER — Inpatient Hospital Stay (HOSPITAL_COMMUNITY): Admission: EM | Admit: 2010-02-05 | Discharge: 2010-02-11 | Payer: Self-pay | Admitting: Emergency Medicine

## 2010-02-10 IMAGING — CR DG CHEST 2V
2 series · 2 of 2 positions shown · non-contrast
Comparison: PA and lateral chest 01/13/2008.

CLINICAL DATA: Shortness of breath, cough and fever.

CHEST - 2 VIEW

[w chest pa *]
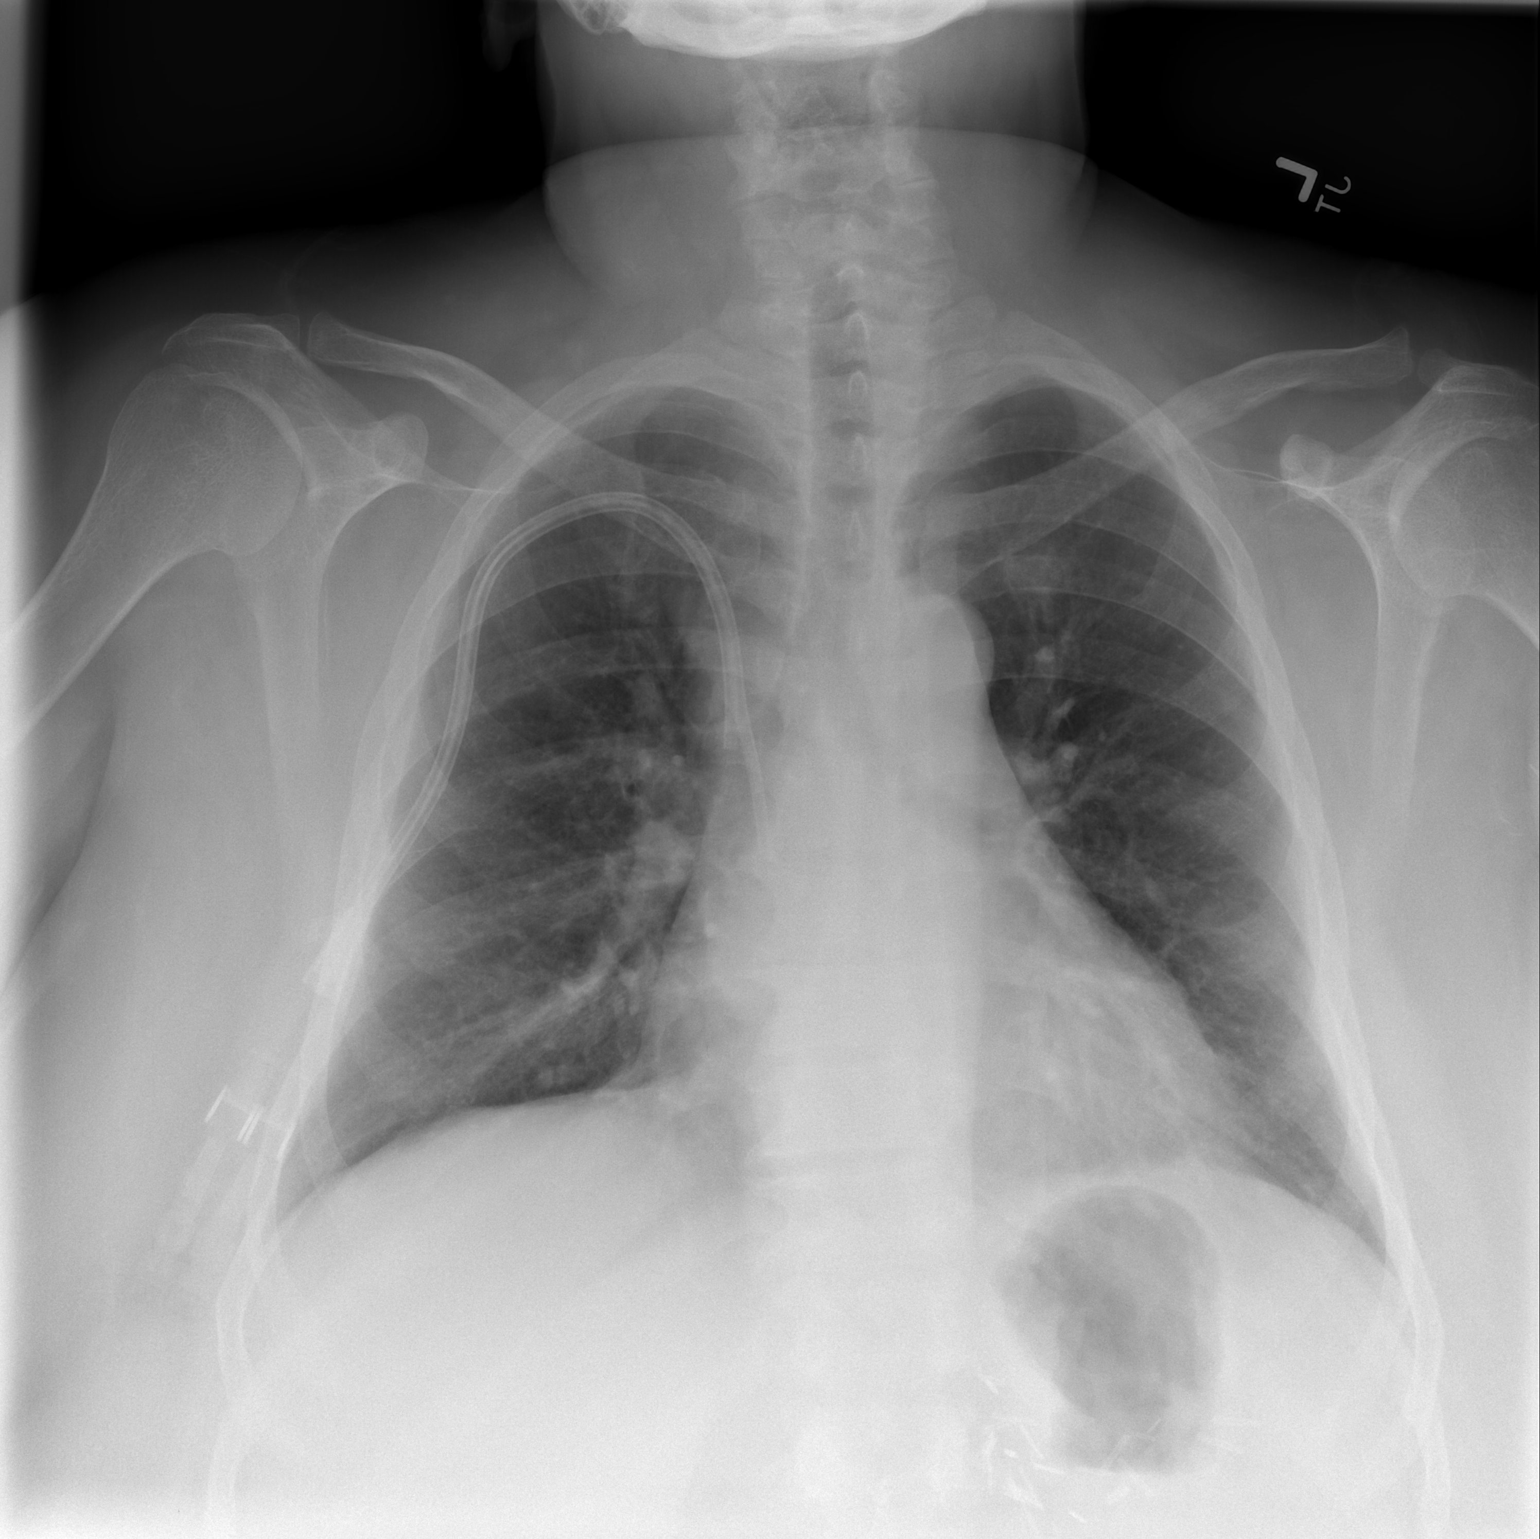

[w chest lat *]
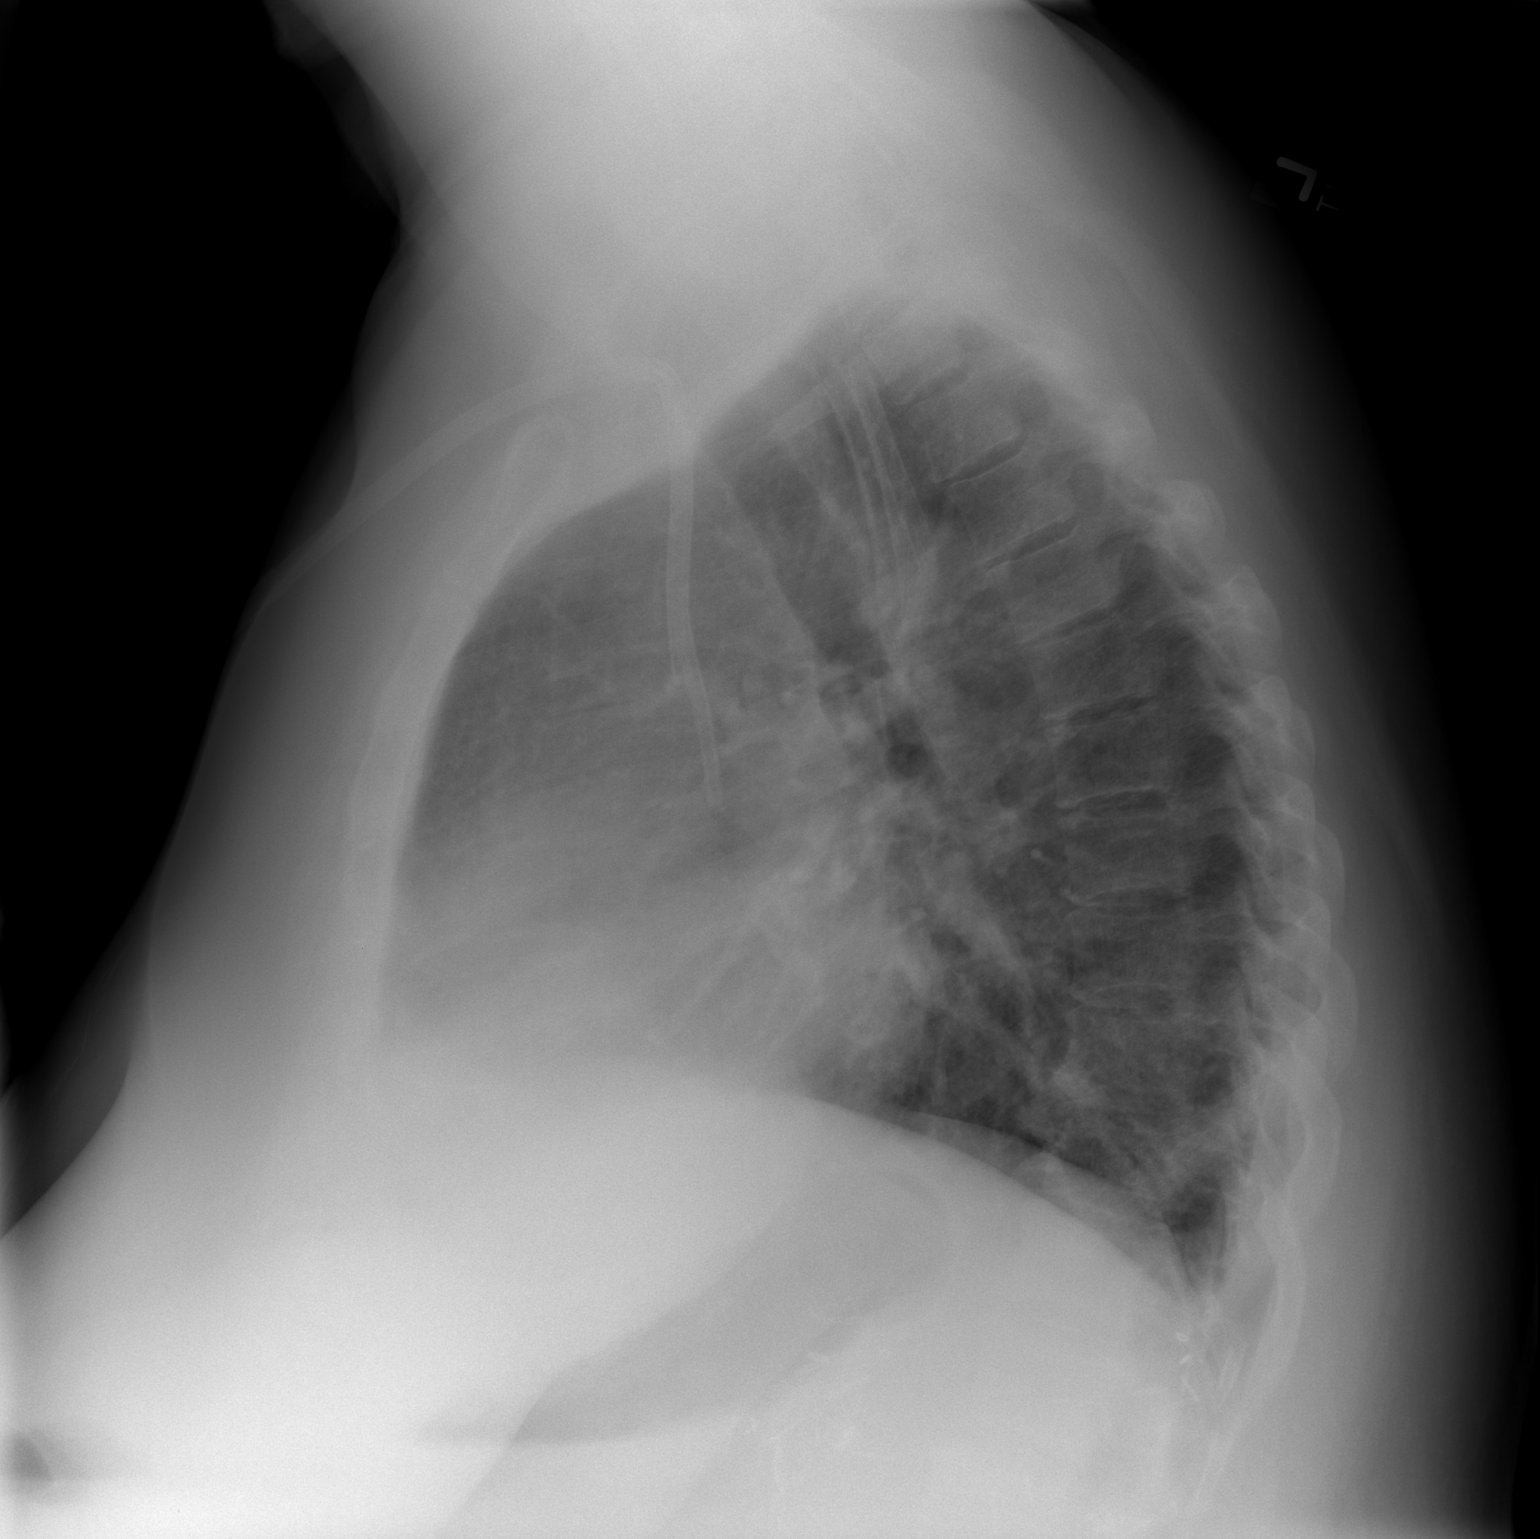

[2 of 2 positions shown; findings below may reference images not displayed]

FINDINGS: The lungs are clear.  Pulmonary edema present on the
prior study has resolved.  No effusion.  Mild cardiomegaly.
IMPRESSION: No acute disease.

## 2010-02-15 IMAGING — CR DG CHEST 1V PORT
1 series · 1 of 1 positions shown · non-contrast
Comparison: 02/10/2008

CLINICAL DATA: Chest pain and shortness of breath.

PORTABLE CHEST - 1 VIEW

[view not recorded]
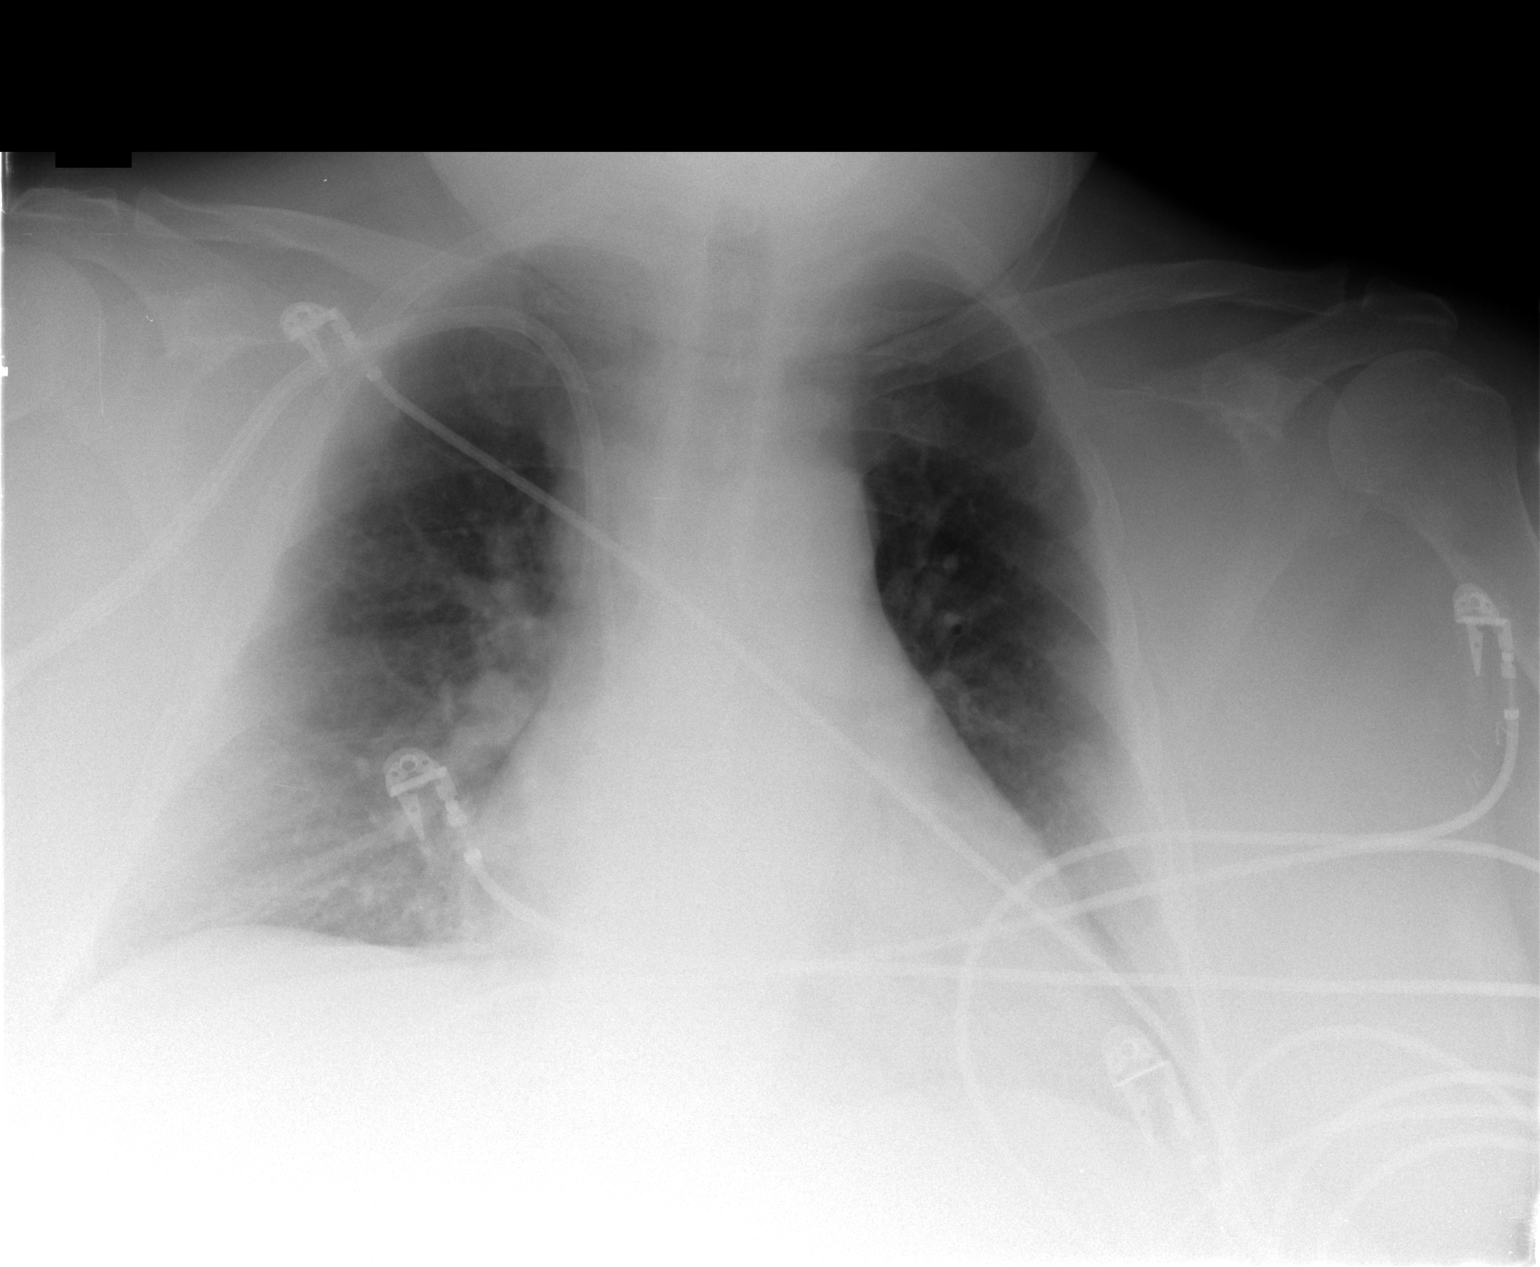

[1 of 1 positions shown; findings below may reference images not displayed]

FINDINGS: Cardiomegaly is again identified.
A right central venous catheter with tips overlying the mid and
lower SVC again noted.
No evidence of focal airspace disease, pleural effusions,
pneumothorax, or overt pulmonary edema.
No evidence of acute bony abnormality identified.
IMPRESSION: Cardiomegaly without evidence of acute cardiopulmonary disease.

## 2010-02-16 ENCOUNTER — Encounter: Payer: Self-pay | Admitting: Internal Medicine

## 2010-02-20 ENCOUNTER — Encounter: Payer: Self-pay | Admitting: Internal Medicine

## 2010-02-21 ENCOUNTER — Encounter: Payer: Self-pay | Admitting: Internal Medicine

## 2010-03-09 IMAGING — CR DG ABDOMEN ACUTE W/ 1V CHEST
2 series · 2 of 2 positions shown · non-contrast
Comparison: CT abdomen 09/12/2007, chest radiograph 02/15/2008.

CLINICAL DATA: Abdominal pain, fatigue.

ACUTE ABDOMEN SERIES (ABDOMEN 2 VIEW & CHEST 1 VIEW)

[t abdomen supine (1 of 2)]
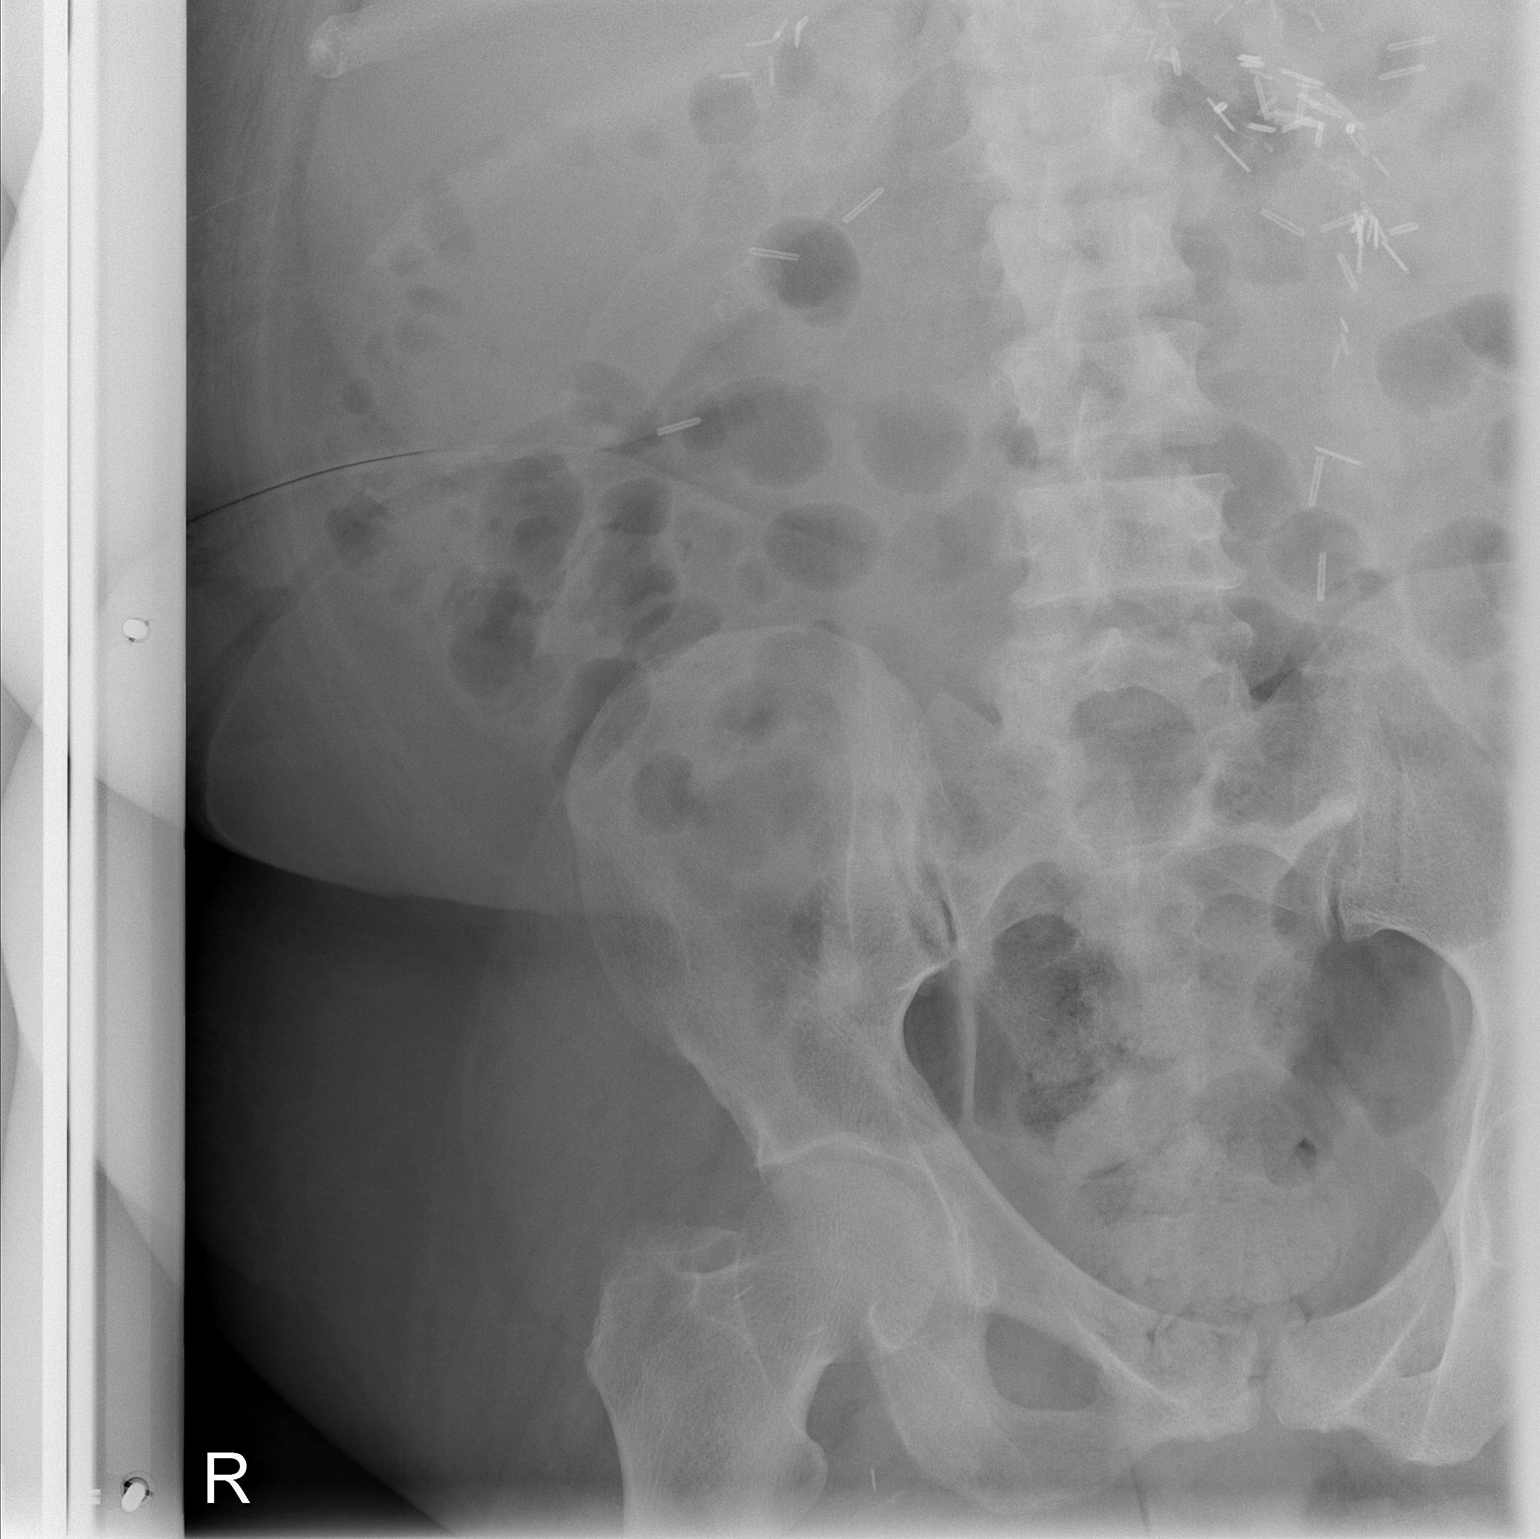

[t abdomen supine (2 of 2)]
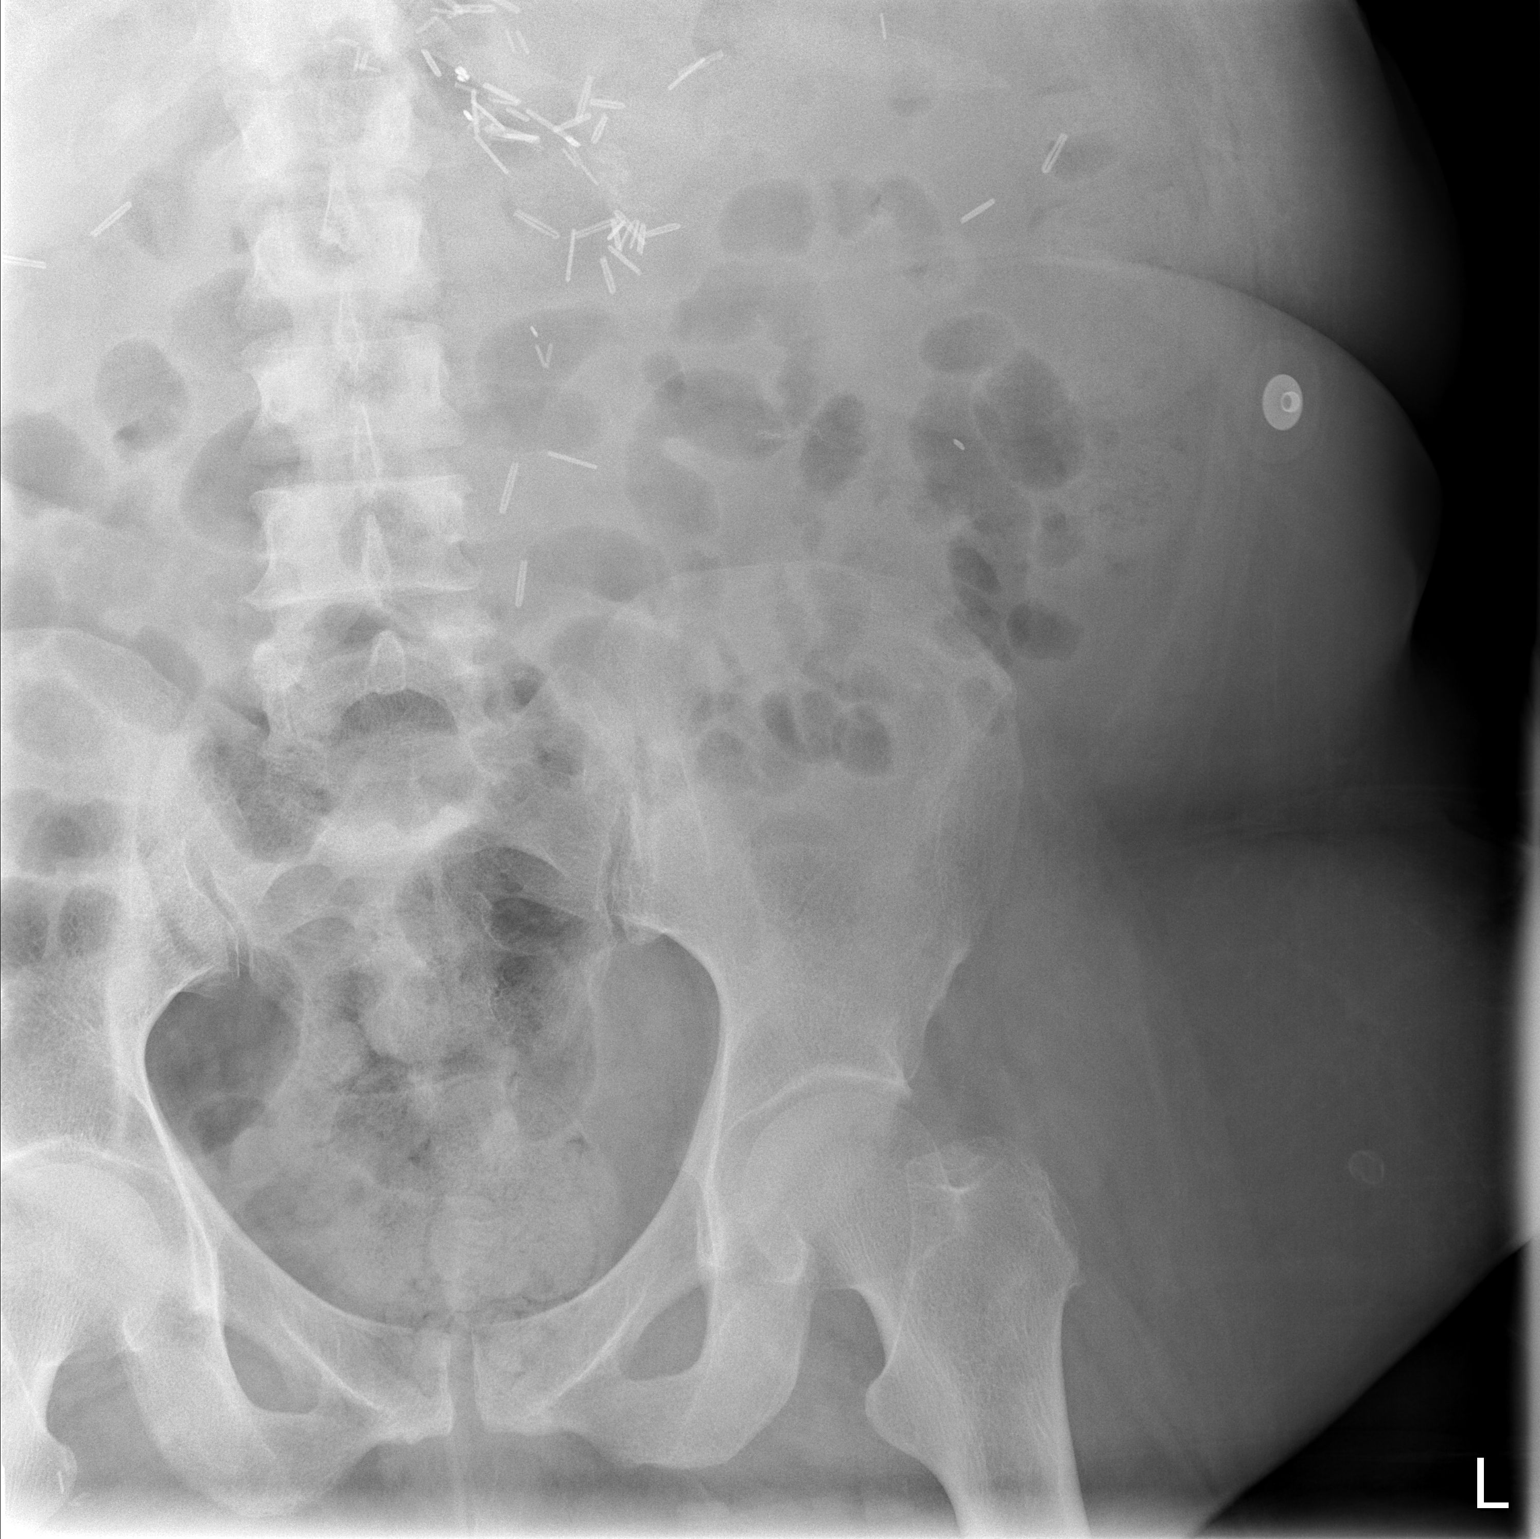

[2 of 2 positions shown; findings below may reference images not displayed]

FINDINGS: Bibasilar atelectasis is present.  No free air is present
underneath the hemidiaphragms.  Surgical clips are identified
within the left upper quadrant.  Mild cardiomegaly.  Exam limited
by patient body habitus.  No pathologic fluid levels identified.
Stool and bowel gas identified in the rectum.  This suggests that
no complete obstruction is present.
IMPRESSION: 1.  Bibasilar atelectasis.
2.  Postsurgical changes in the abdomen with nonobstructive bowel
gas pattern.
3.  Exam limited by patient body habitus.

## 2010-03-11 IMAGING — CR DG CHEST 2V
2 series · 2 of 2 positions shown · non-contrast
Comparison: 02/15/2008

CLINICAL DATA: Chest pain

CHEST - 2 VIEW

[w chest pa]
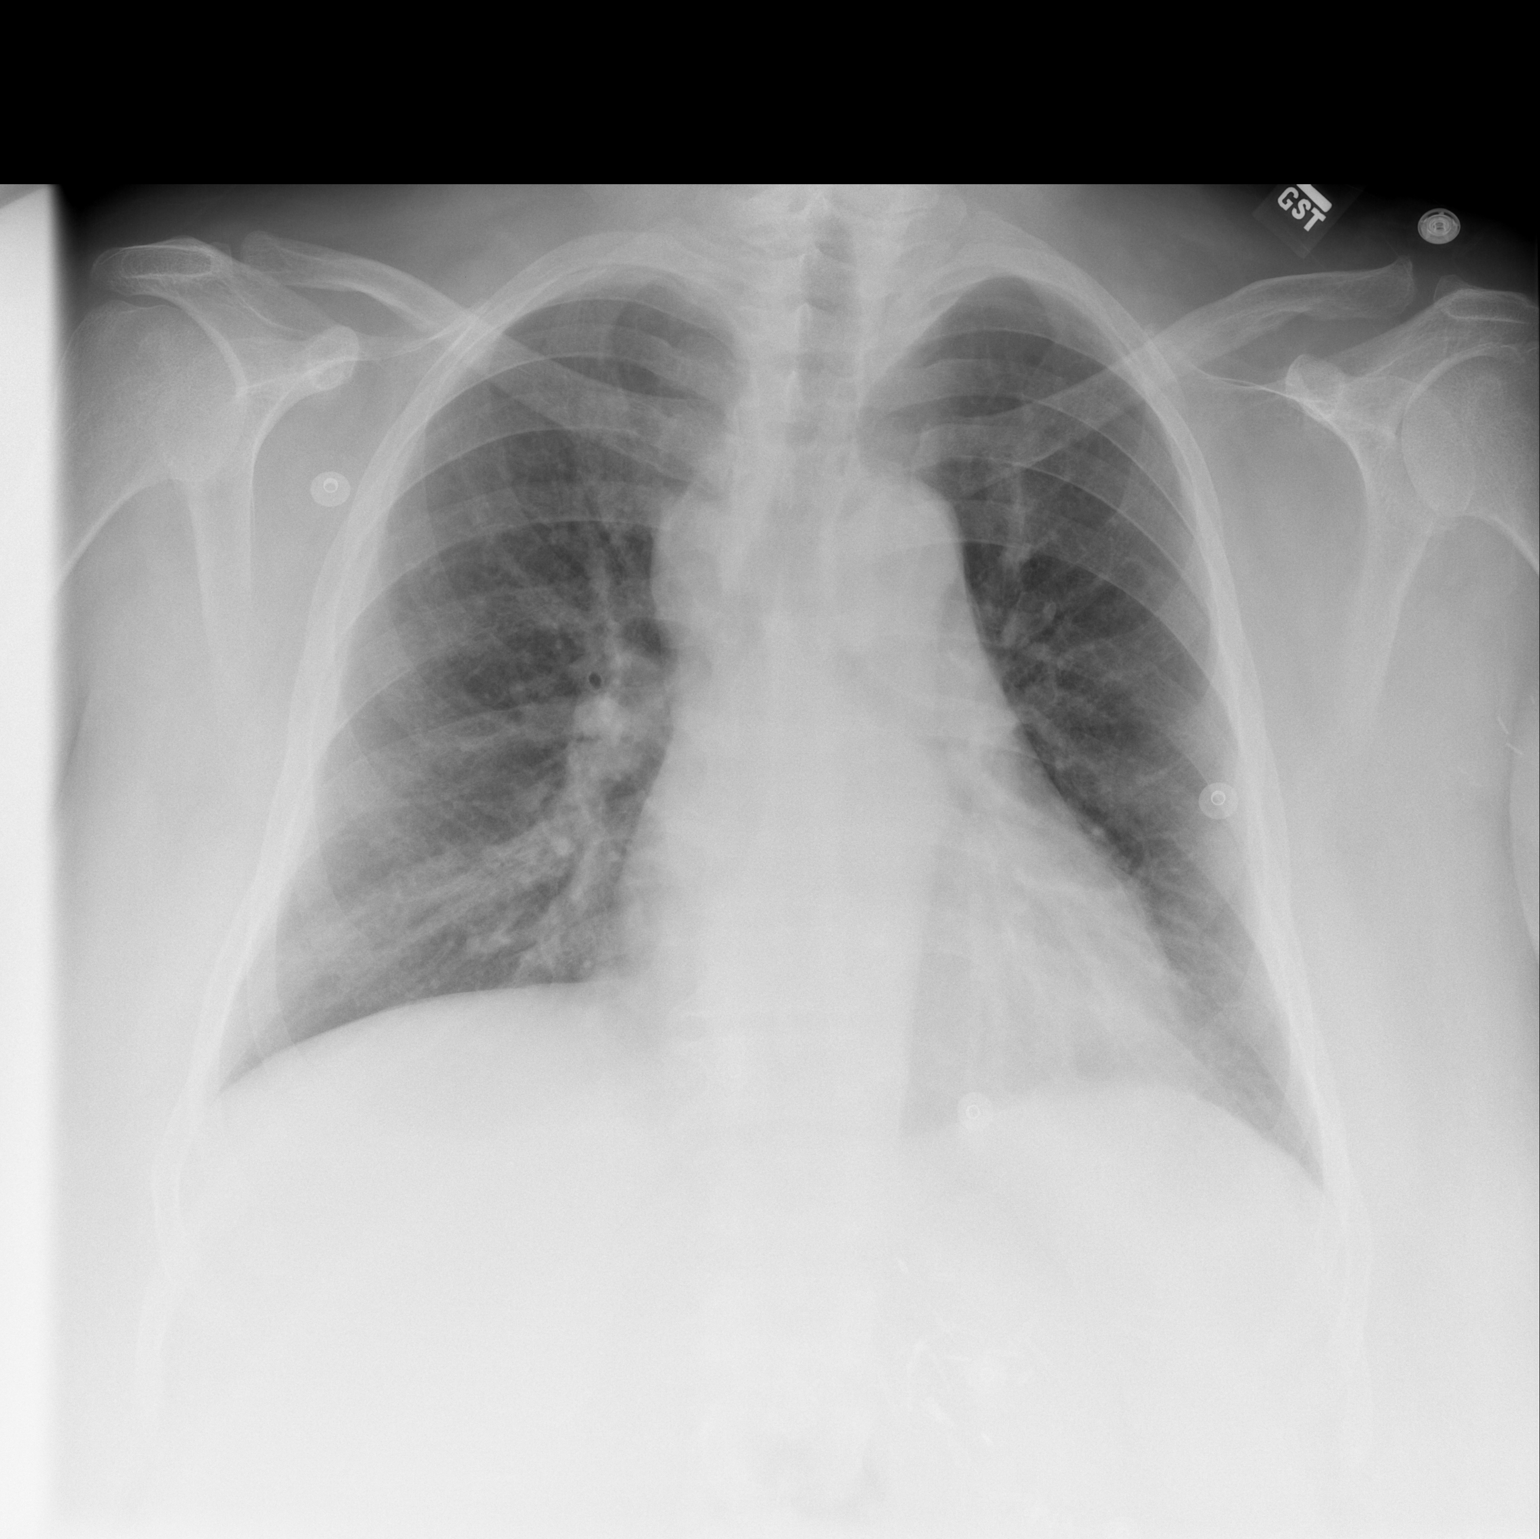

[w chest lat]
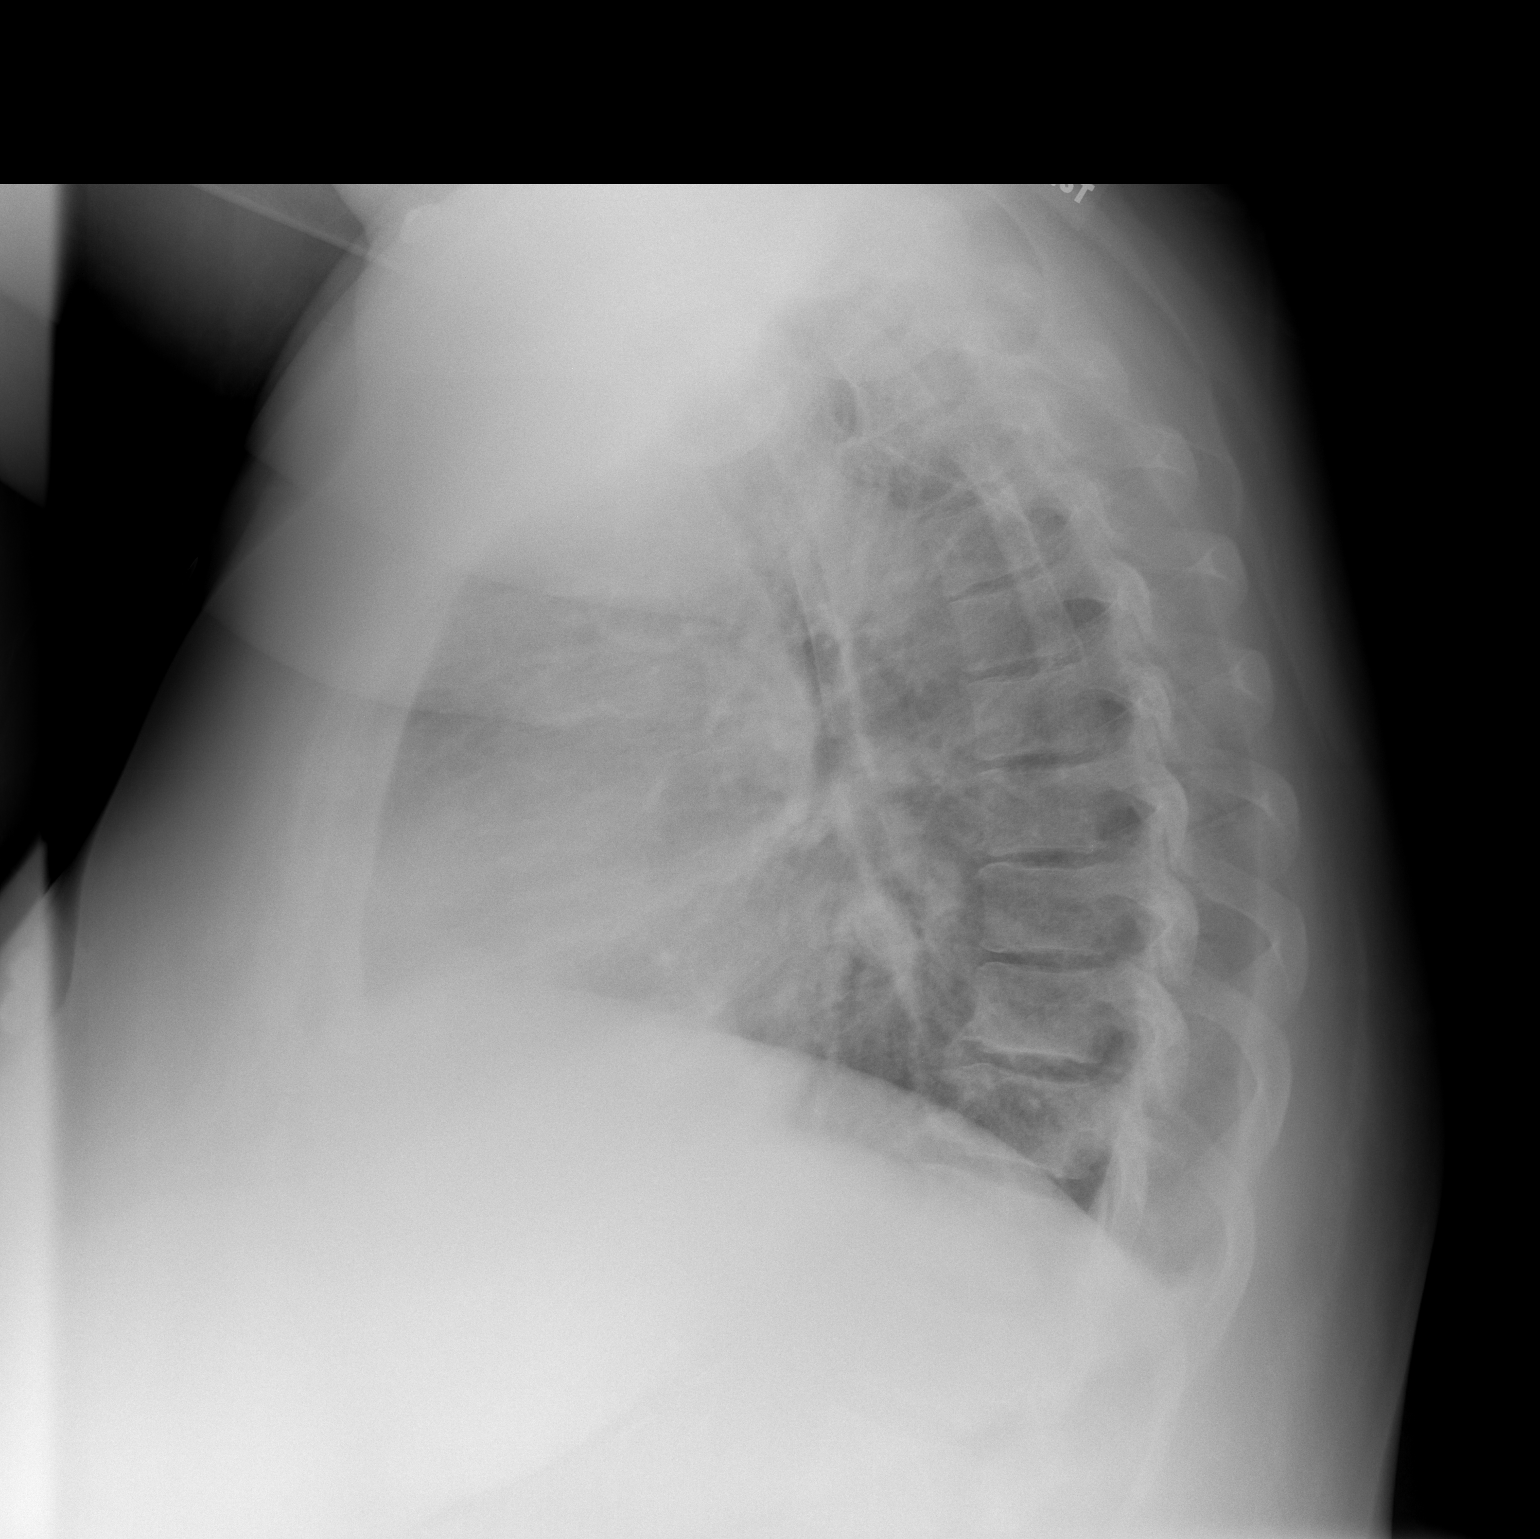

[2 of 2 positions shown; findings below may reference images not displayed]

FINDINGS: Heart is borderline in size.  There is mild peribronchial
thickening and vascular congestion.  No confluent opacity effusion
or edema.  No acute bony abnormality.
IMPRESSION: Borderline heart size.  Mild vascular congestion.

## 2010-03-12 IMAGING — CR DG CHEST 2V
2 series · 2 of 2 positions shown · non-contrast
Comparison: 03/10/2008

CLINICAL DATA: Chest pain.

CHEST - 2 VIEW

[w chest lat *]
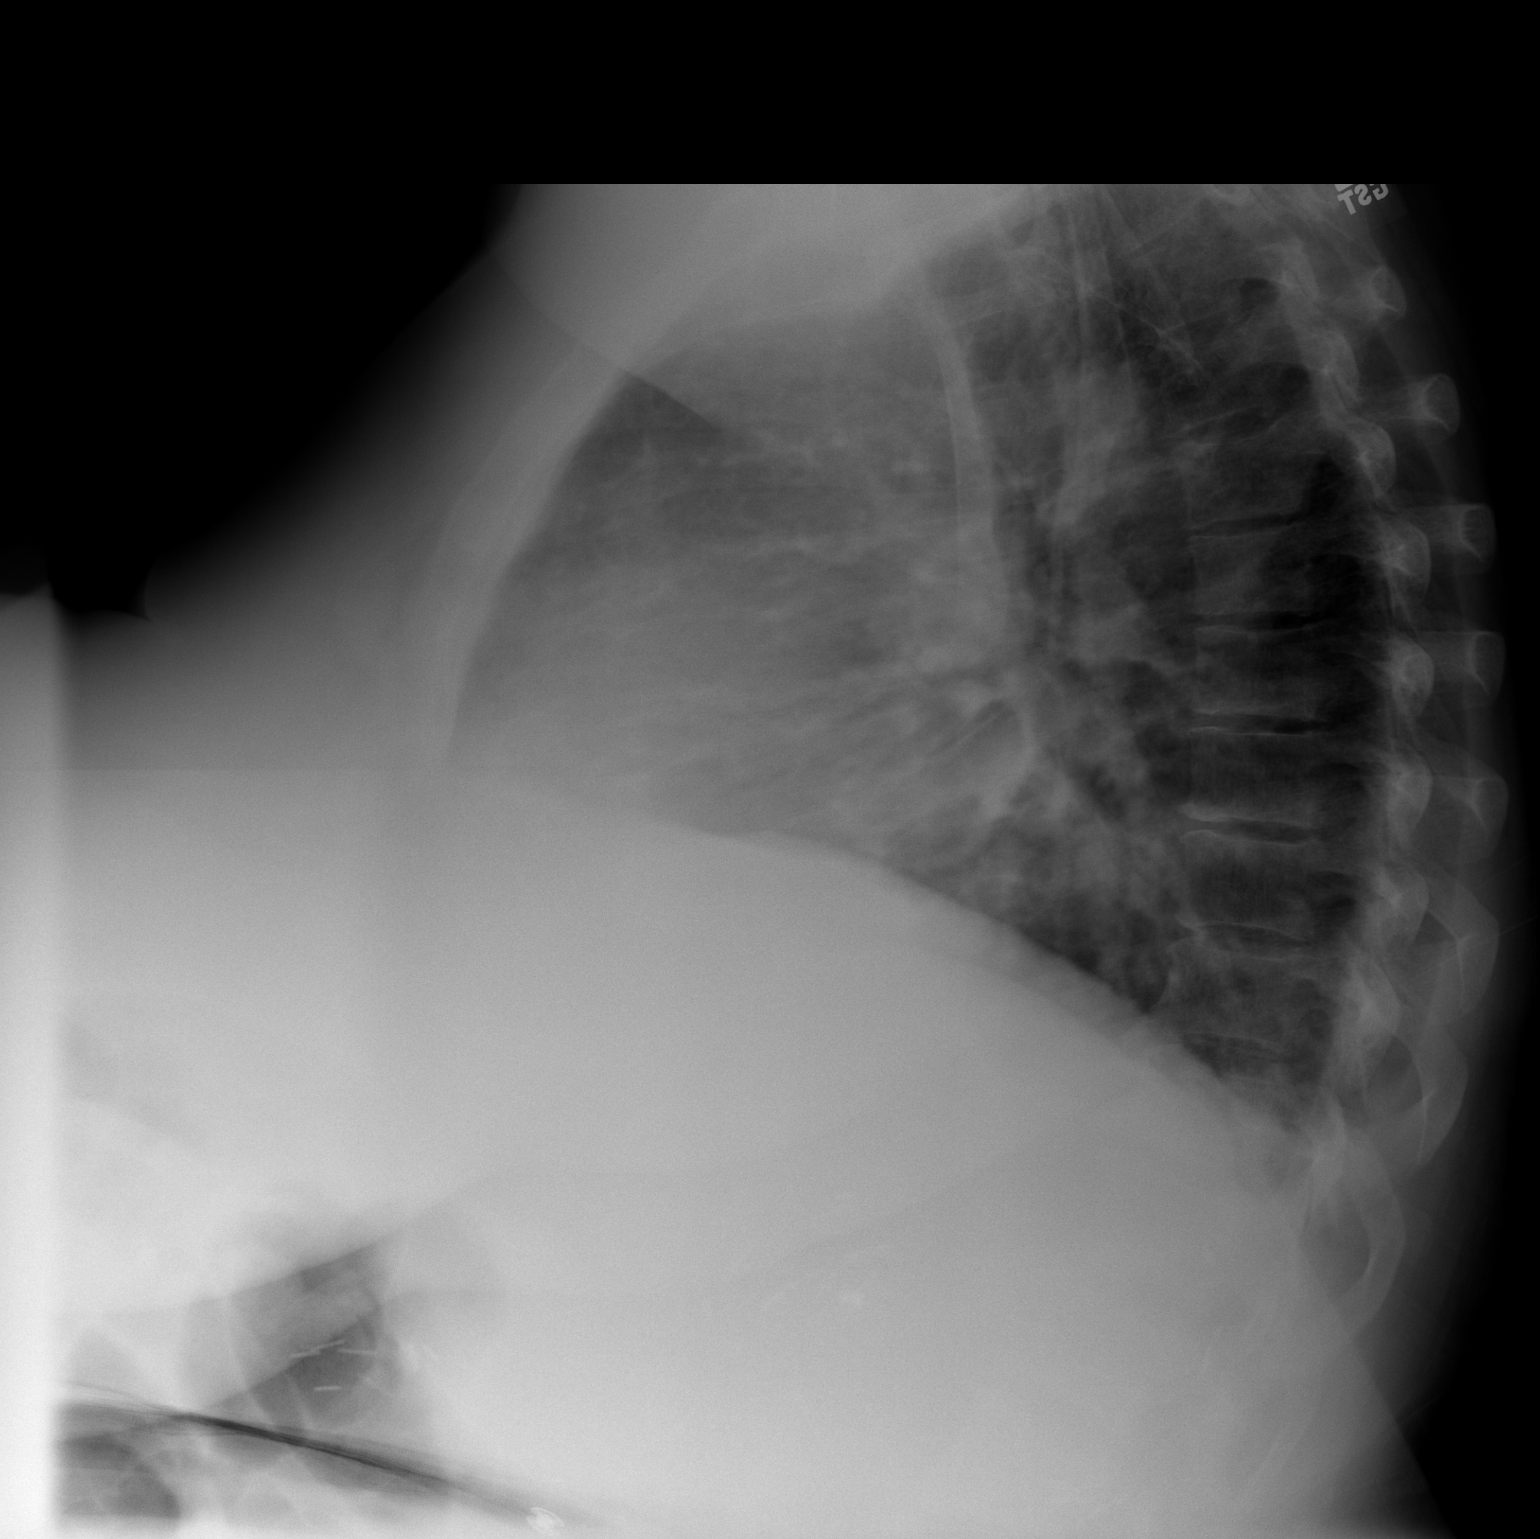

[w chest pa]
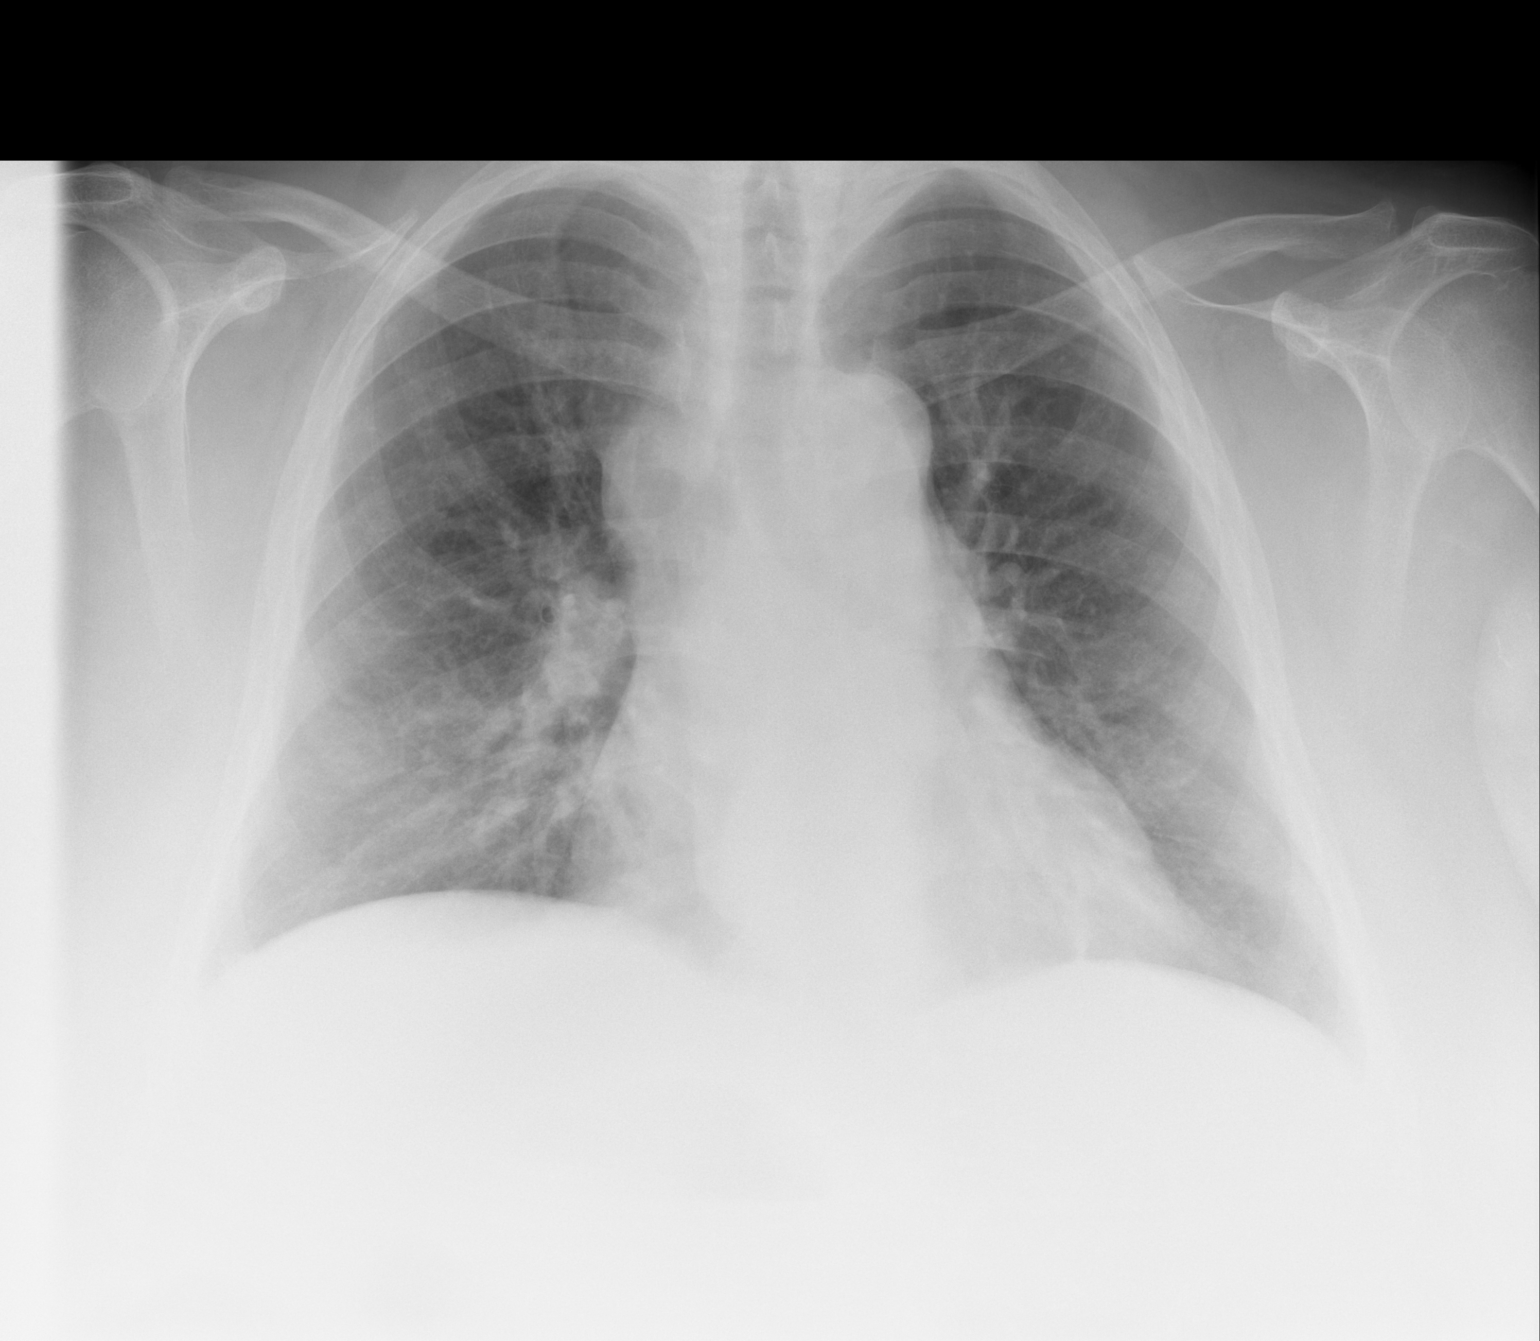

[2 of 2 positions shown; findings below may reference images not displayed]

FINDINGS: Heart is mildly enlarged.  There is vascular congestion.
Mild interstitial prominence noted, which could represent mild
interstitial edema.  No effusions or acute bony abnormality.
IMPRESSION: Mild cardiomegaly, vascular congestion, possible interstitial
edema.

## 2010-03-19 ENCOUNTER — Inpatient Hospital Stay (HOSPITAL_COMMUNITY): Admission: EM | Admit: 2010-03-19 | Discharge: 2010-03-20 | Payer: Self-pay | Admitting: Emergency Medicine

## 2010-04-05 ENCOUNTER — Encounter: Payer: Self-pay | Admitting: Internal Medicine

## 2010-04-11 IMAGING — CR DG LUMBAR SPINE COMPLETE 4+V
5 series · 5 of 5 positions shown · non-contrast
Comparison: CT abdomen pelvis 09/12/2007.

CLINICAL DATA: 51-year-old female with the back pain involving the
legs and feet status post fall.

LUMBAR SPINE - COMPLETE 4+ VIEW

[t l-spine a.p. *]
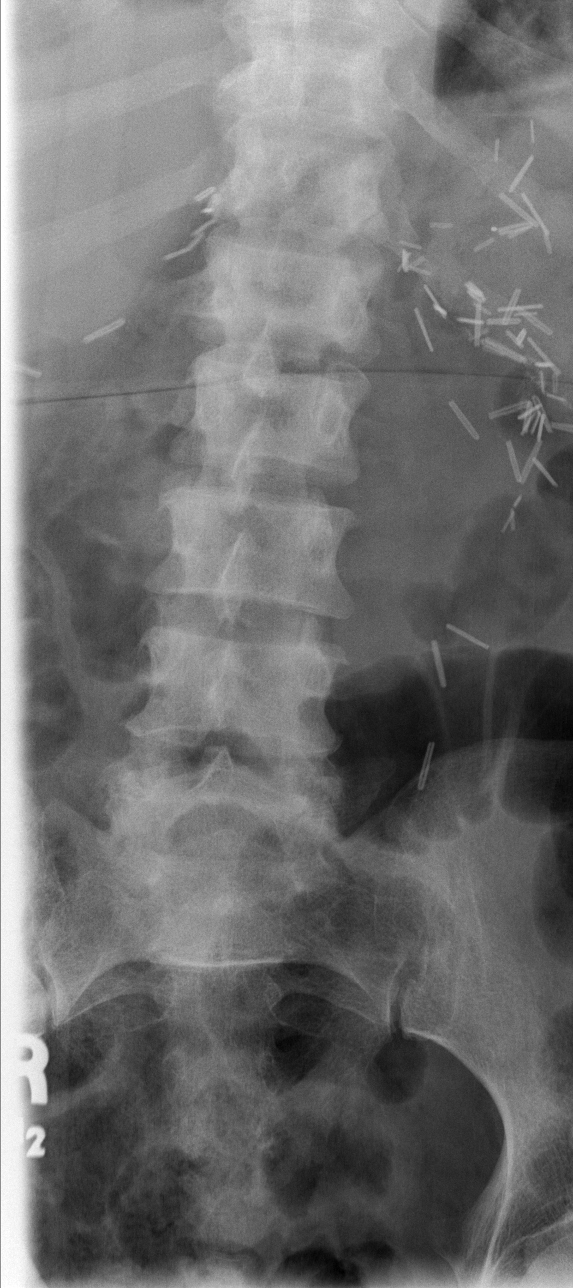

[t l-spine oblique exposure * (1 of 2)]
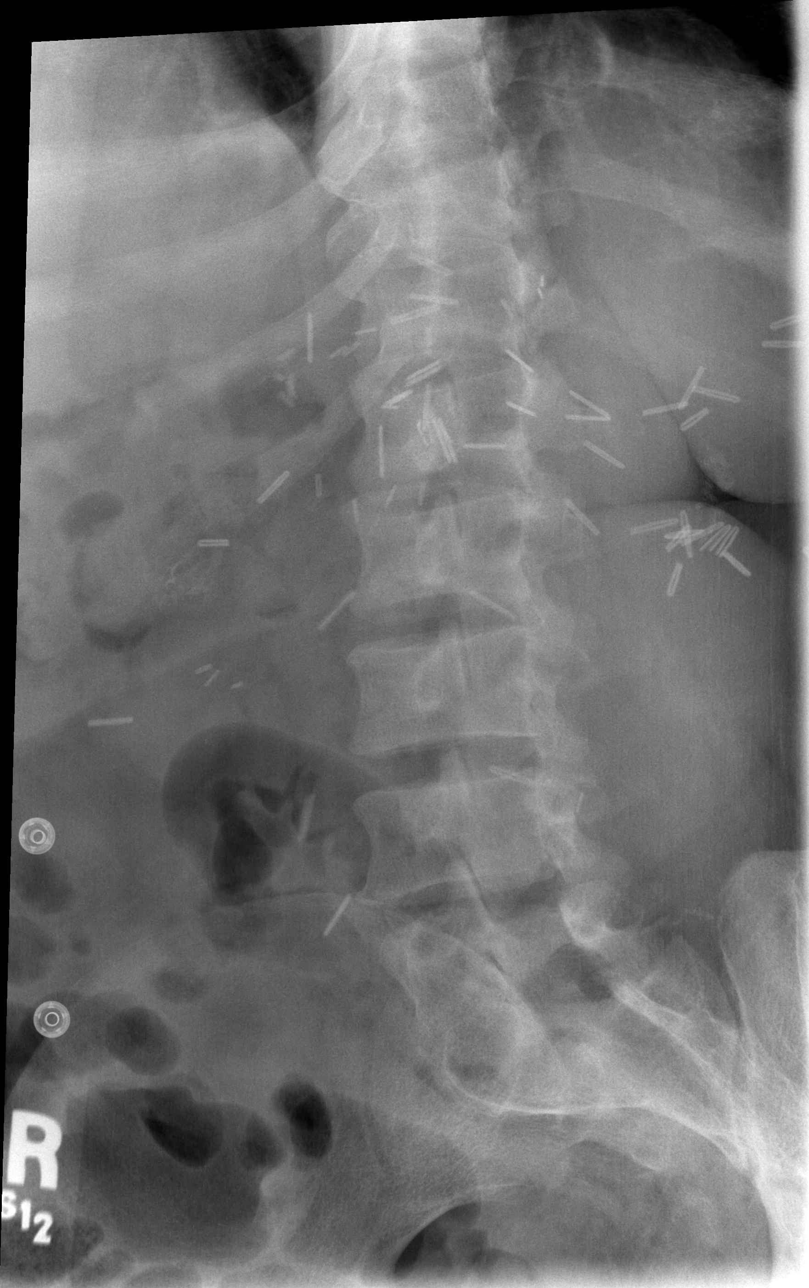

[t l-spine oblique exposure * (2 of 2)]
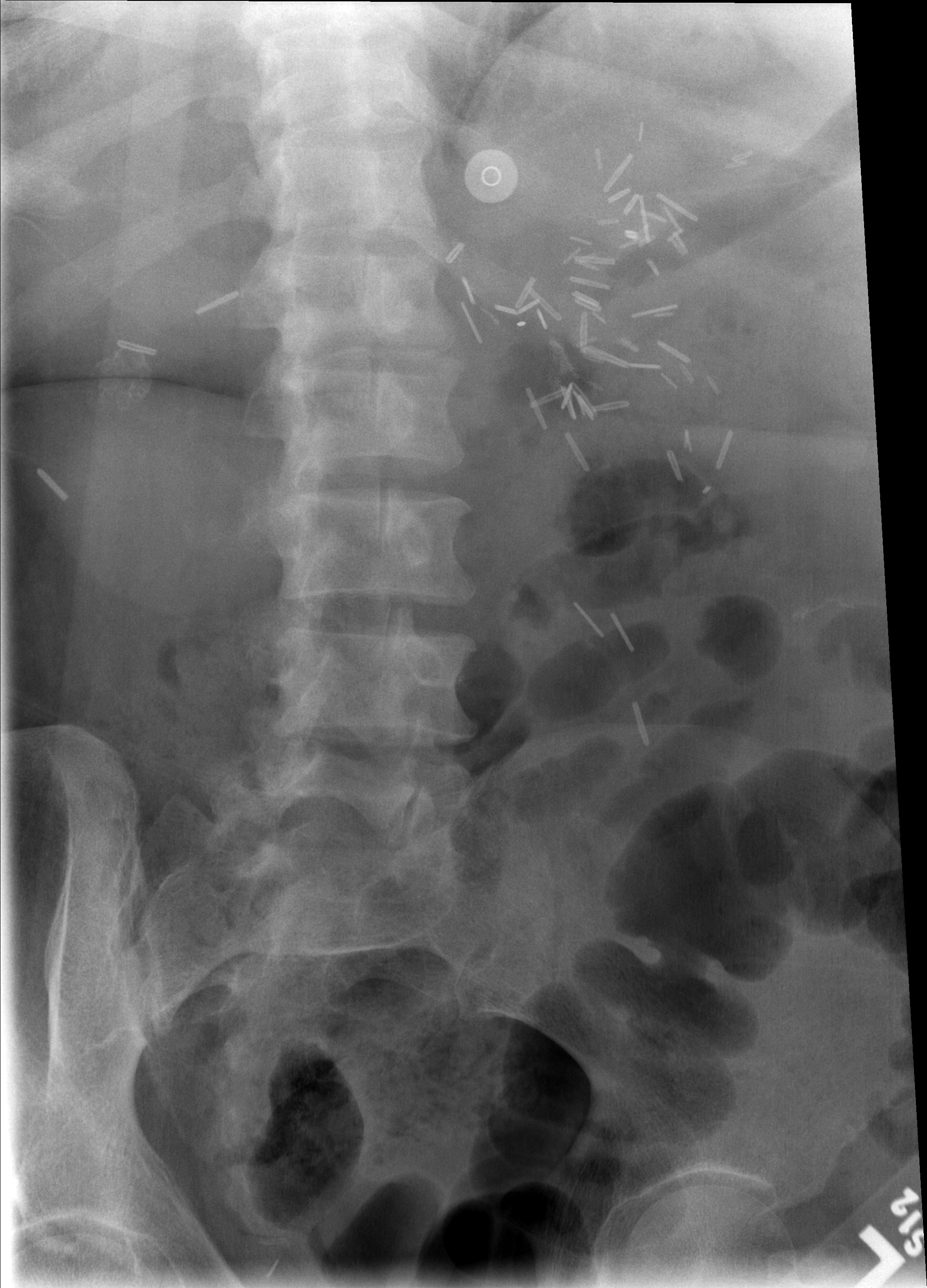

[t l-spine lat *]
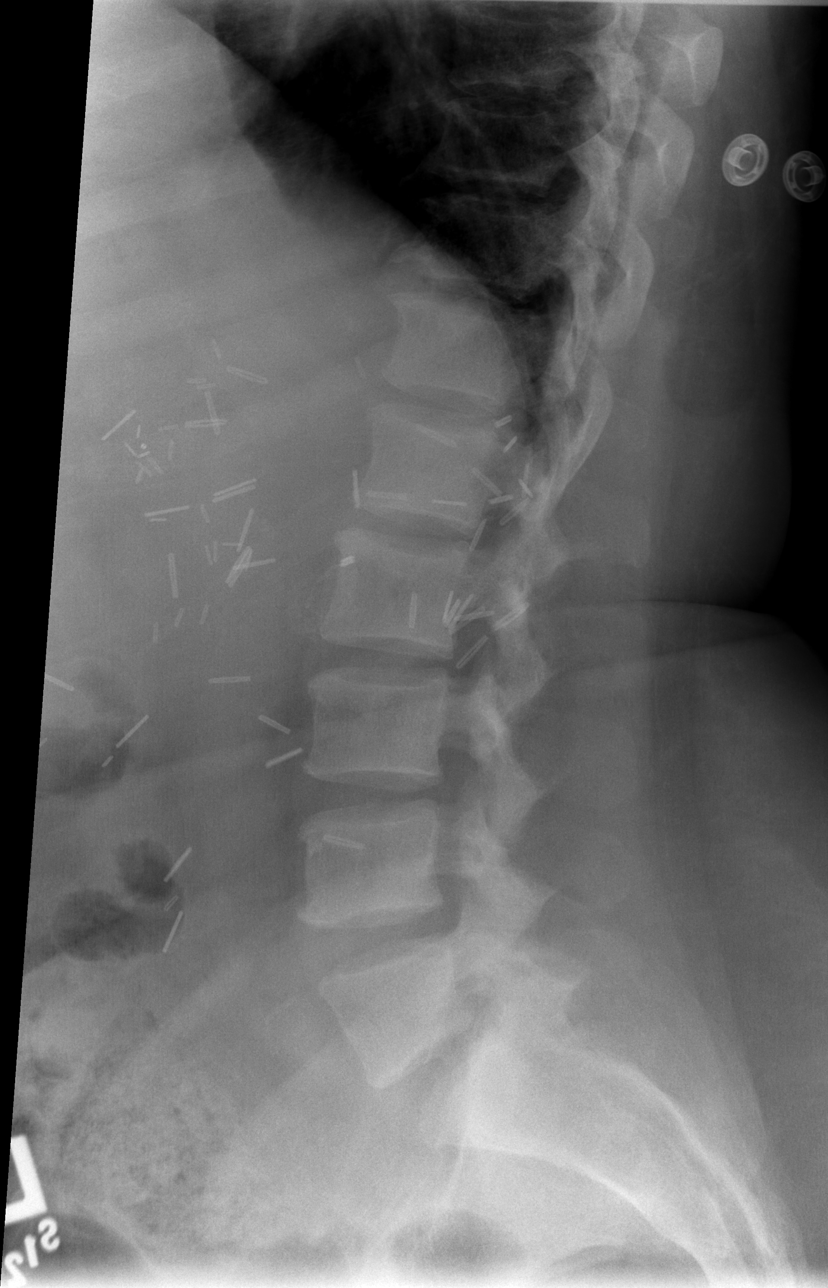

[t l-spine l5-s1 spot]
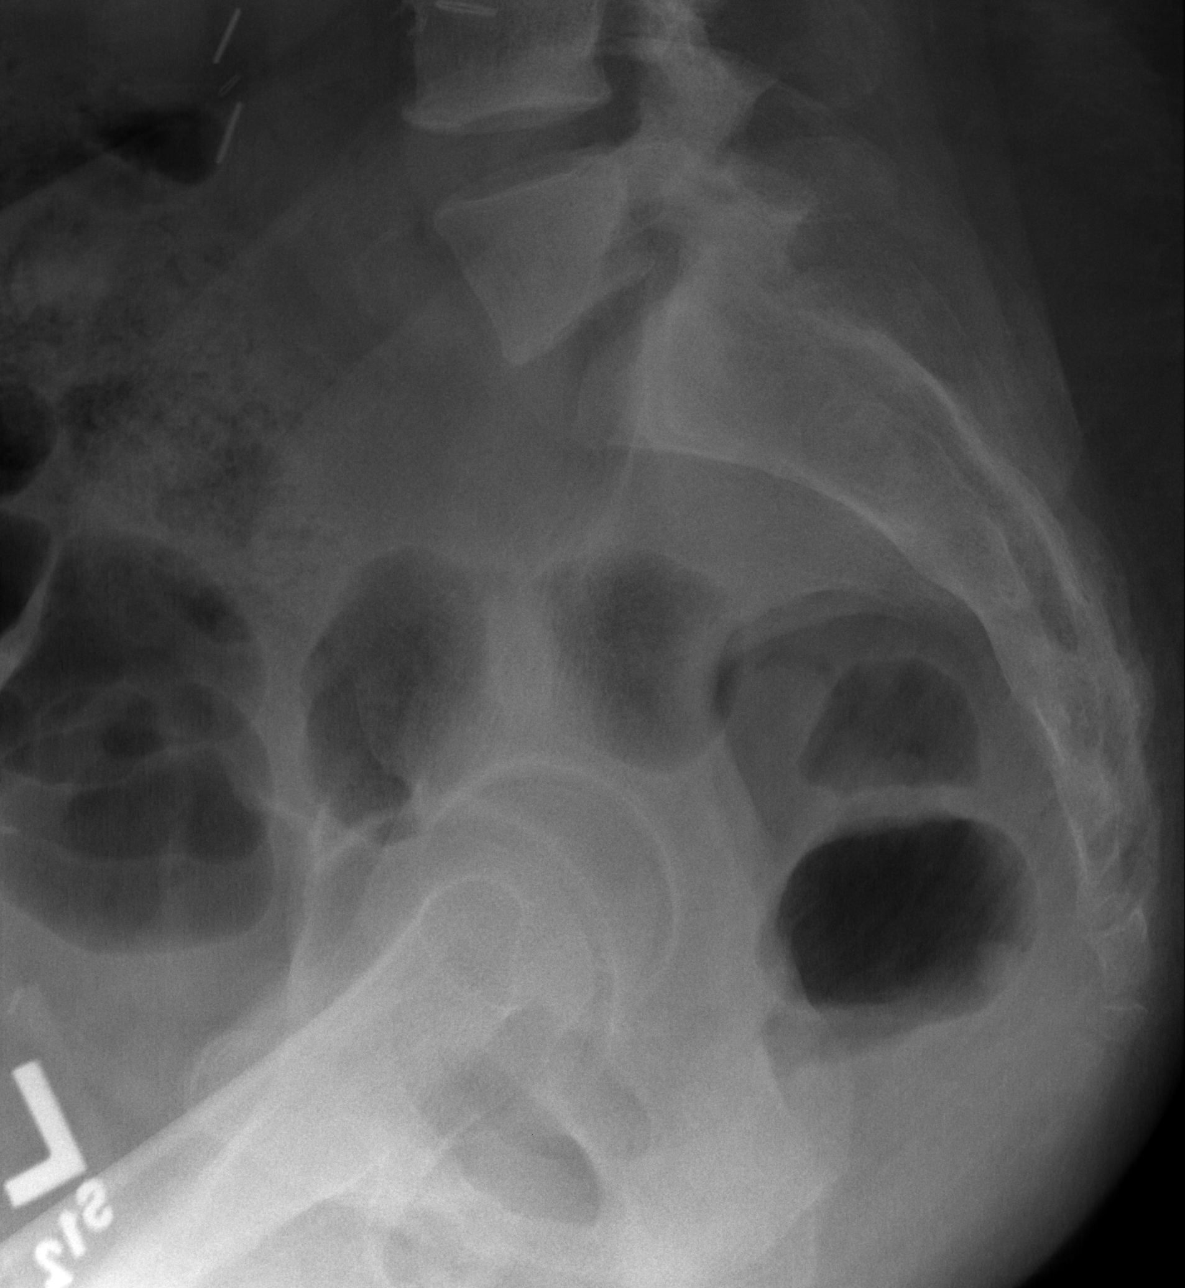

[5 of 5 positions shown; findings below may reference images not displayed]

FINDINGS: Normal lumbar segmentation.  Numerous surgical clips
project in the epigastric region and along the paraspinal soft
tissues, seen to be mostly retroperitoneal on the comparison CT.
Stable vertebral body height and alignment.  Stable lower thoracic
osteophytes.  Intervertebral disc spaces appear stable and fairly
preserved.  The sacrum appears stable.  No pars fracture.
IMPRESSION: Stable lumbar spine.  No acute findings identified.

## 2010-04-13 ENCOUNTER — Ambulatory Visit (HOSPITAL_COMMUNITY): Admission: RE | Admit: 2010-04-13 | Discharge: 2010-04-13 | Payer: Self-pay | Admitting: Nephrology

## 2010-04-15 ENCOUNTER — Emergency Department (HOSPITAL_COMMUNITY): Admission: EM | Admit: 2010-04-15 | Discharge: 2010-04-15 | Payer: Self-pay | Admitting: Emergency Medicine

## 2010-05-01 ENCOUNTER — Emergency Department (HOSPITAL_COMMUNITY): Admission: EM | Admit: 2010-05-01 | Discharge: 2010-05-01 | Payer: Self-pay | Admitting: Pediatrics

## 2010-05-03 ENCOUNTER — Telehealth: Payer: Self-pay | Admitting: Internal Medicine

## 2010-05-05 ENCOUNTER — Encounter: Payer: Self-pay | Admitting: Internal Medicine

## 2010-05-08 ENCOUNTER — Encounter: Payer: Self-pay | Admitting: Internal Medicine

## 2010-05-09 ENCOUNTER — Encounter: Payer: Self-pay | Admitting: Internal Medicine

## 2010-05-16 HISTORY — PX: THROMBECTOMY: PRO61

## 2010-05-16 IMAGING — CR DG CHEST 2V
2 series · 2 of 2 positions shown · non-contrast
Comparison: Chest radiograph 03/11/2008

CLINICAL DATA: Dialysis patient, abscess, chest pain, cough

CHEST - 2 VIEW

[w chest pa]
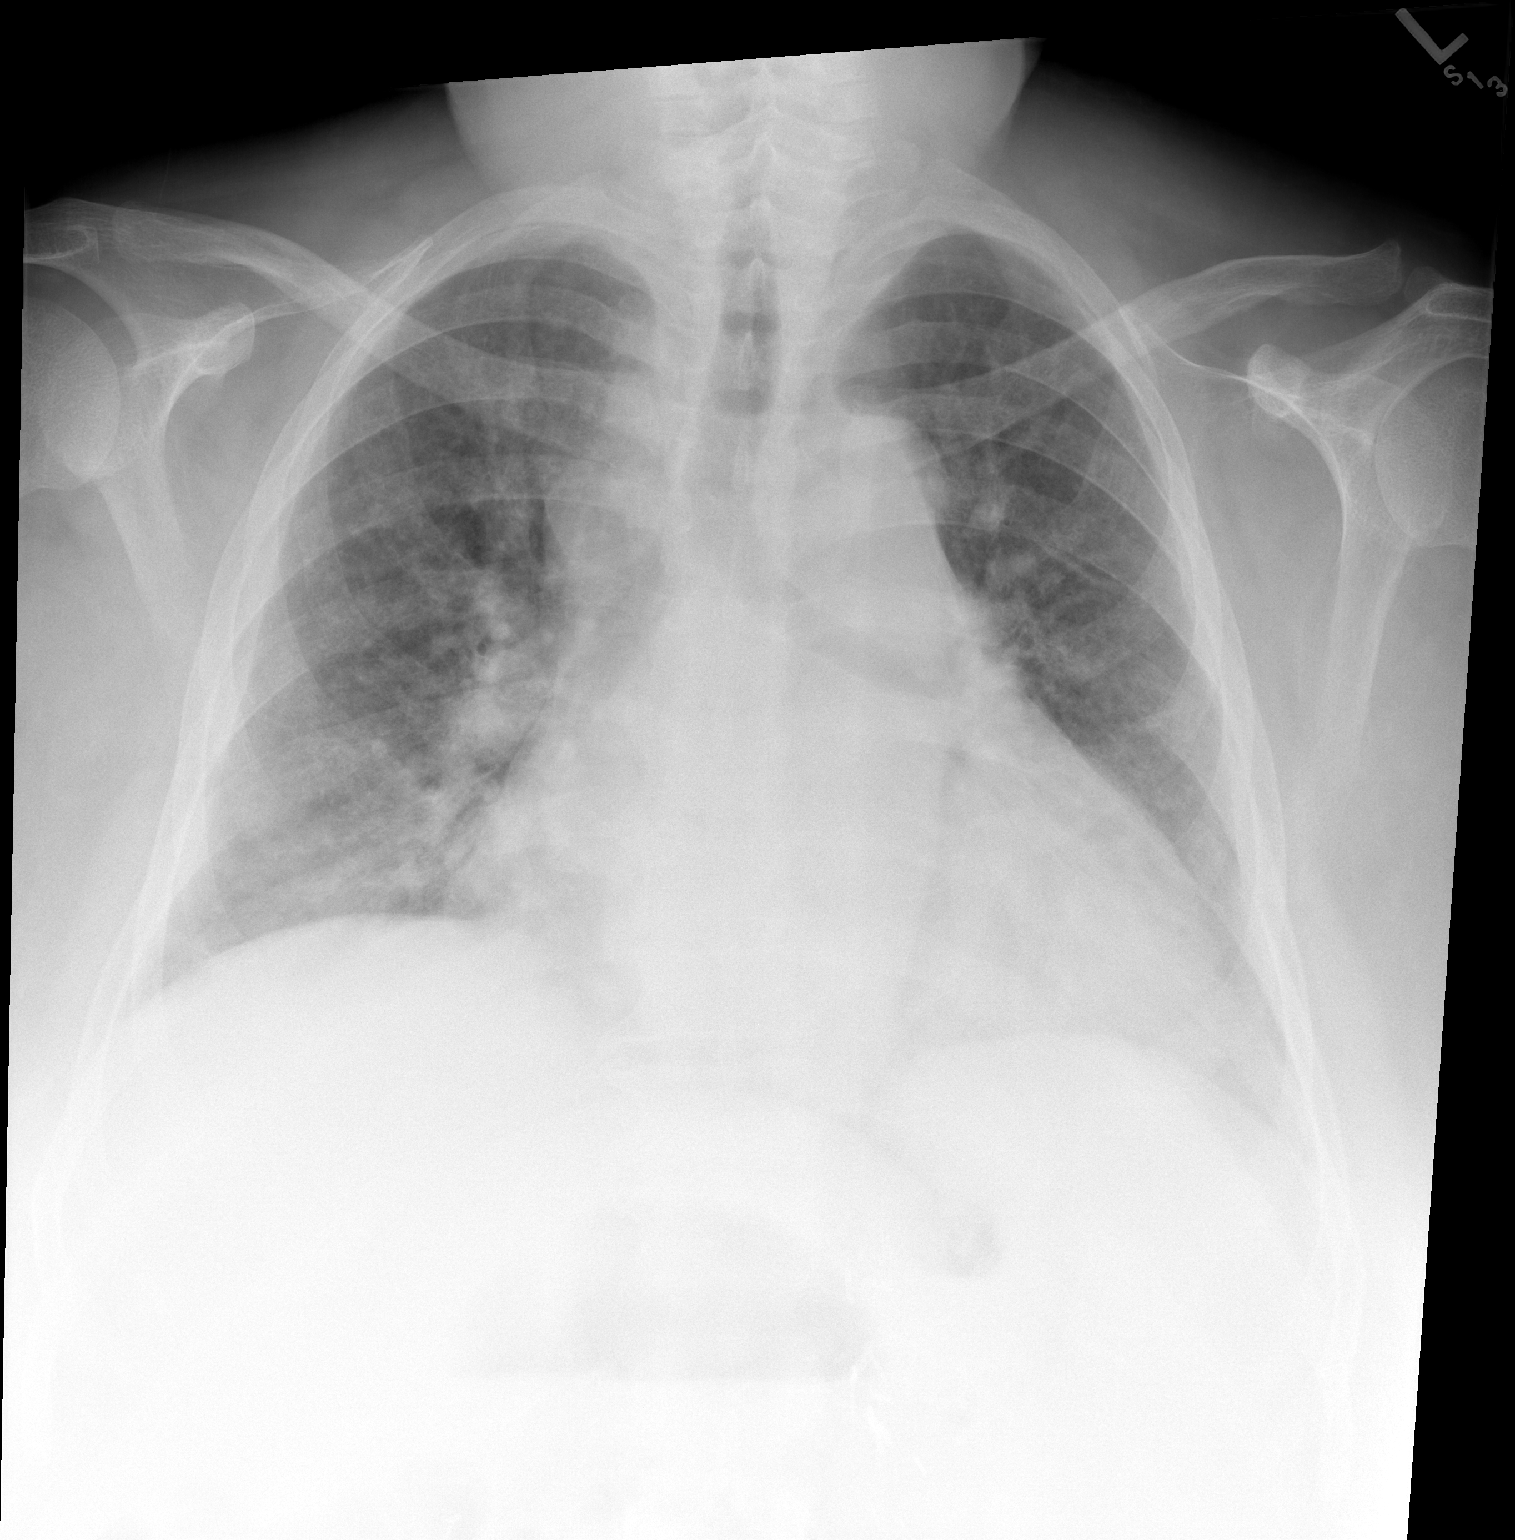

[w chest lat *]
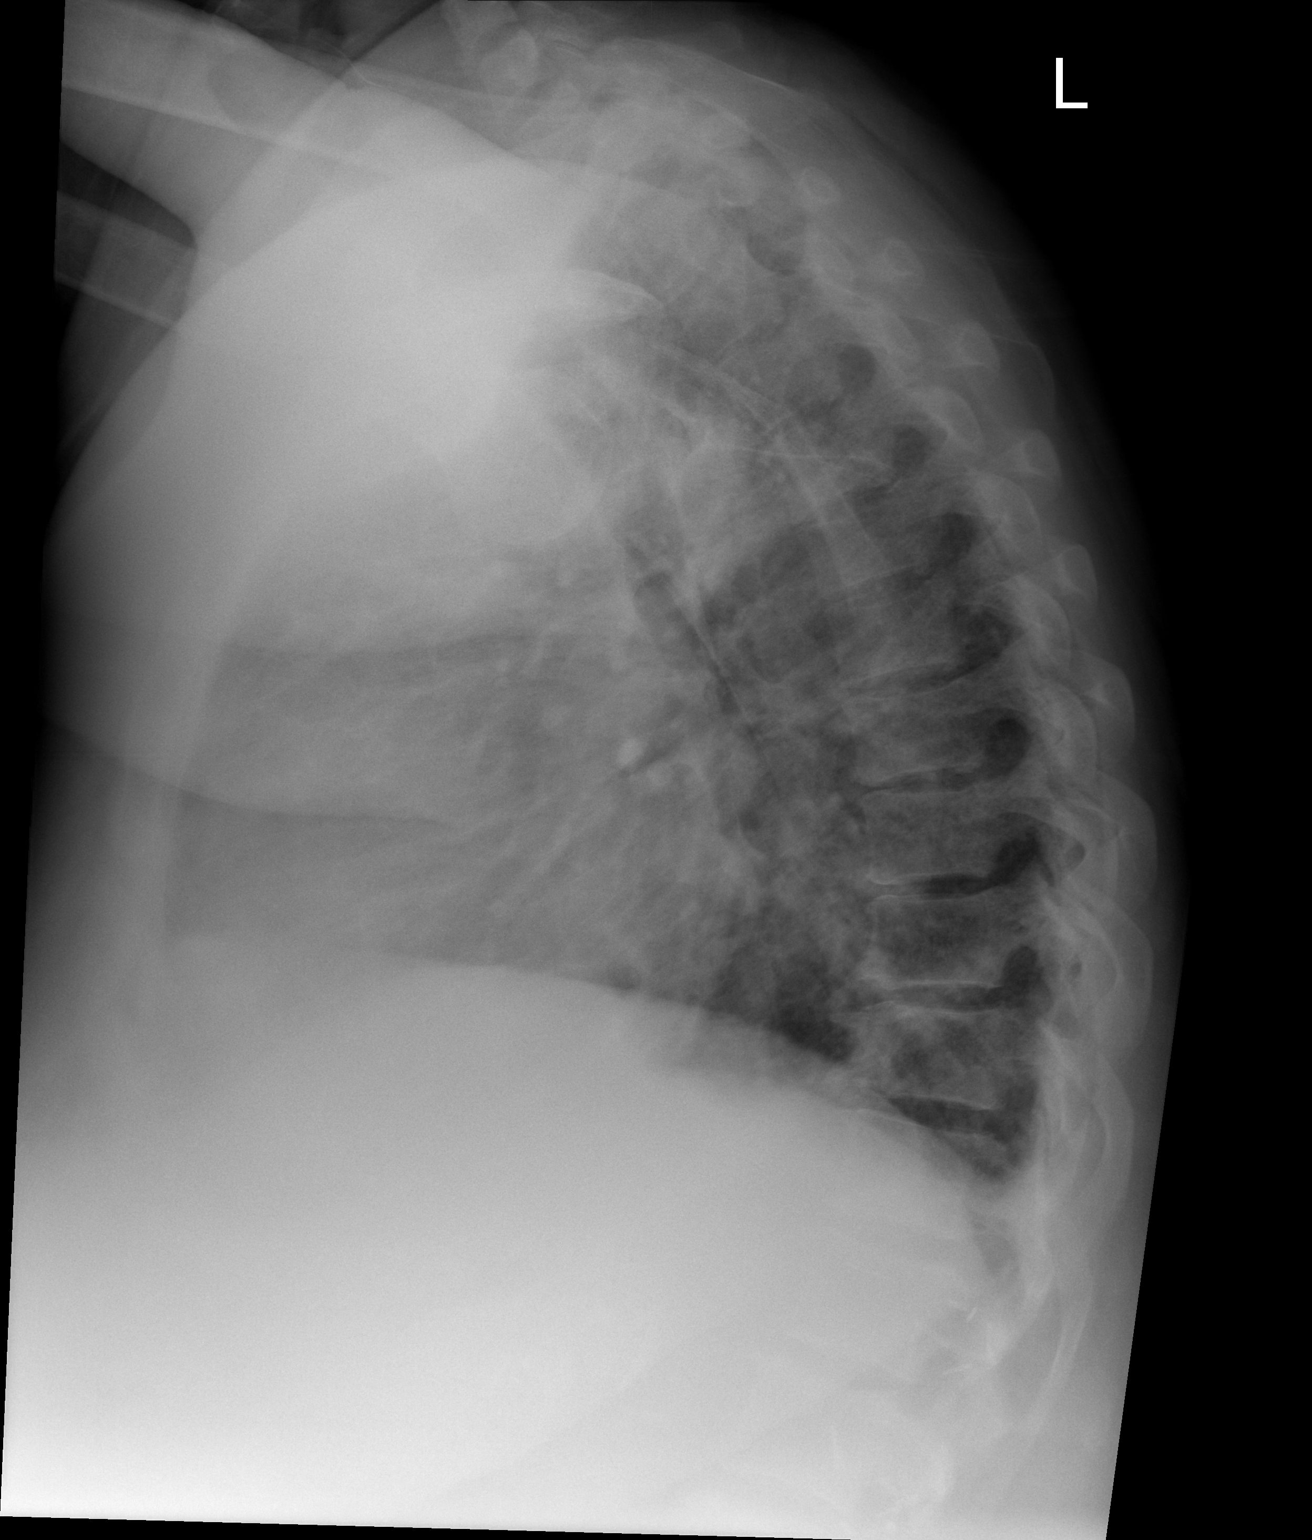

[2 of 2 positions shown; findings below may reference images not displayed]

FINDINGS: Interval enlargement of cardiac silhouette compared to
prior.  There is mild perihilar air space disease.  Costophrenic
angles appear clear.  No evidence pneumothorax.
IMPRESSION: Cardiomegaly and perihilar air space disease suggests congestive
heart failure.  Cannot exclude infectious process.

## 2010-05-17 IMAGING — CR DG CHEST 1V PORT
1 series · 1 of 1 positions shown · non-contrast
Comparison: 05/16/2008 of one and 6 hours and 05/15/2008

CLINICAL DATA: Abscess left upper arm.  Postop.

PORTABLE CHEST - 1 VIEW

[AP]
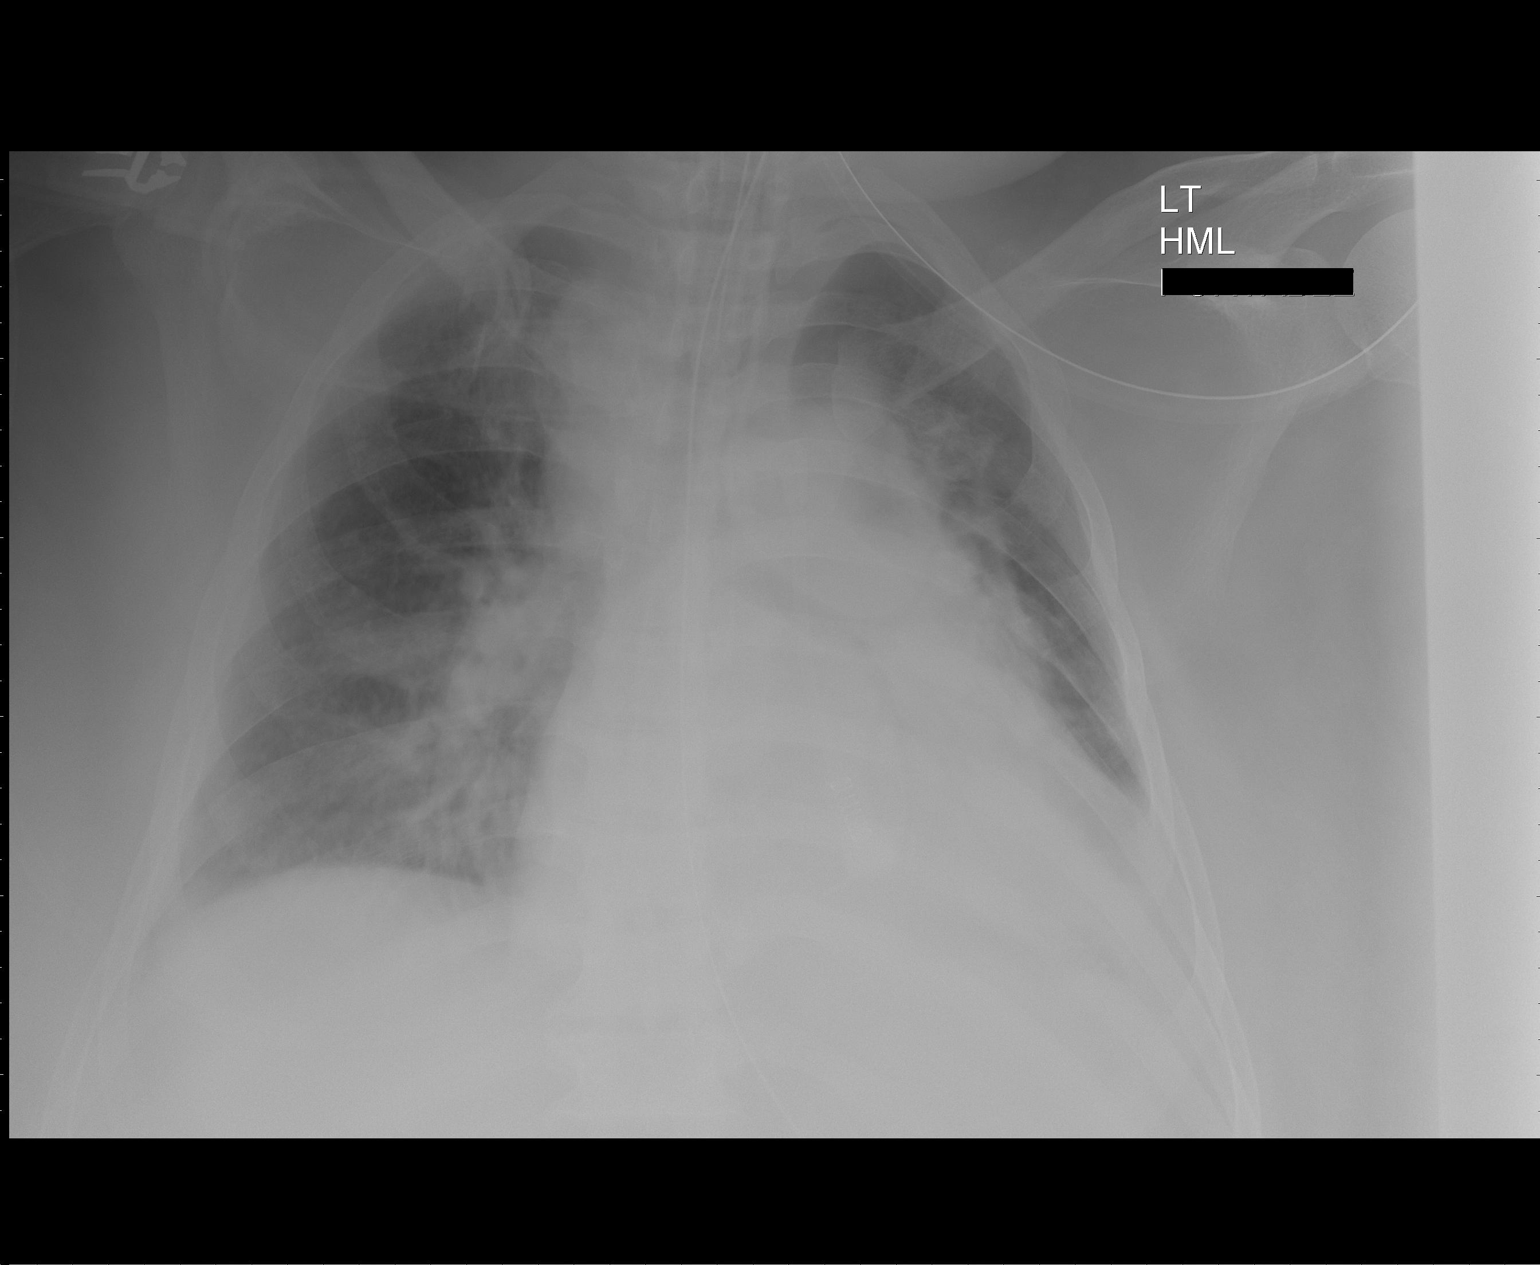

[1 of 1 positions shown; findings below may reference images not displayed]

FINDINGS: Endotracheal tube is satisfactory position, at the level
of the clavicles and 4.5 cm above carina.  Nasogastric tube can be
followed to the stomach but its distal tip is not included on this
exam.

Heart size is stable.  A left retrocardiac opacity obscuring the
left hemidiaphragm persists.  The left costophrenic angle is
obscured, which may be in part due to the large heart size.
Pulmonary vascularity is mildly prominent, without definite edema.
IMPRESSION: 1. Cardiomegaly and pulmonary vascular congestion.
2.  Left retrocardiac opacity may represent atelectasis and/or
consolidation.

## 2010-05-17 IMAGING — CR DG CHEST 1V PORT
1 series · 1 of 1 positions shown · non-contrast
Comparison: 05/15/2008

CLINICAL DATA: Endotracheal tube placement.

PORTABLE CHEST - 1 VIEW

[AP]
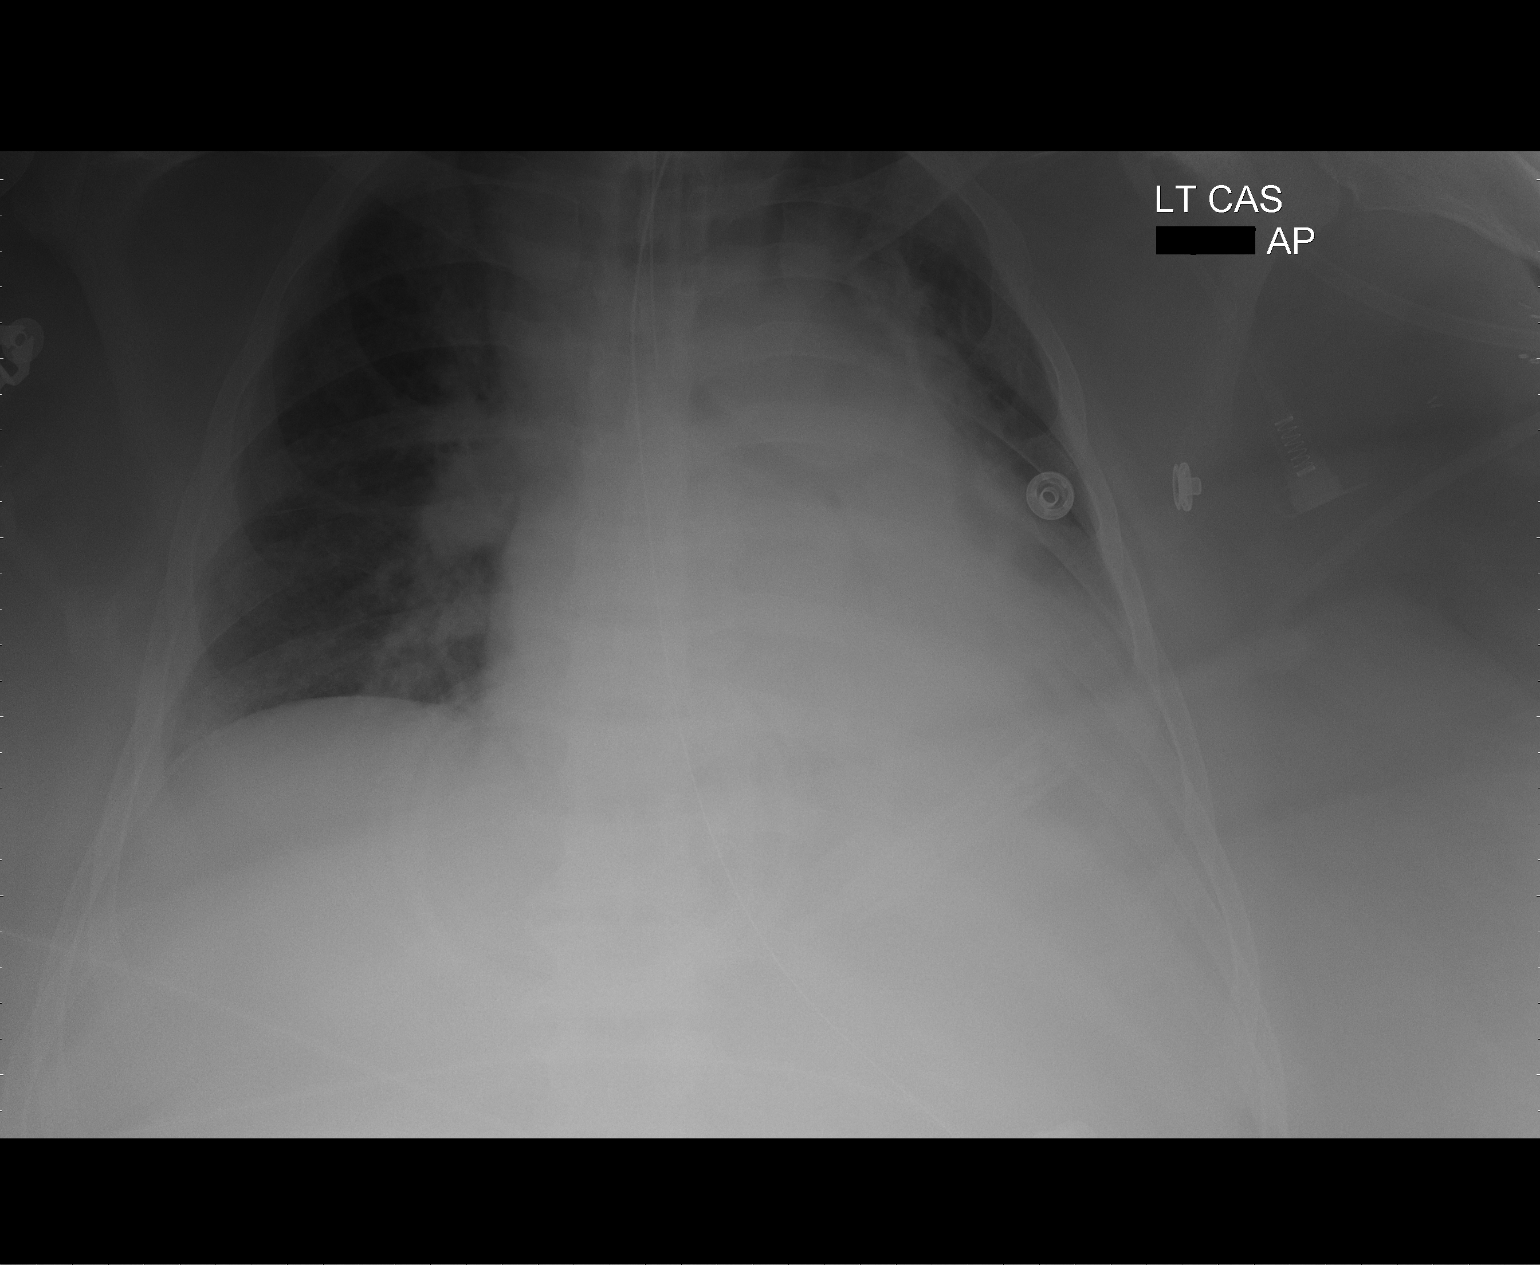

[1 of 1 positions shown; findings below may reference images not displayed]

FINDINGS: The endotracheal tube is 14 mm above the carina.  The NG
tube is in the stomach.  The heart is enlarged.  The mediastinal
and hilar contours are prominent.  There is perihilar edema and
atelectasis along with left lower lobe atelectasis.  No
pneumothorax is seen.
IMPRESSION: 1.  Endotracheal tube is 14 mm above the carina.
2.  NG tube in the stomach.
3.  Perihilar edema and atelectasis and left lower lobe
atelectasis.

## 2010-05-18 IMAGING — CR DG CHEST 1V PORT
1 series · 1 of 1 positions shown · non-contrast
Comparison: 05/16/2008.

CLINICAL DATA: Left upper arm abscess.  Ventilator dependence..

PORTABLE CHEST - 1 VIEW

[view not recorded]
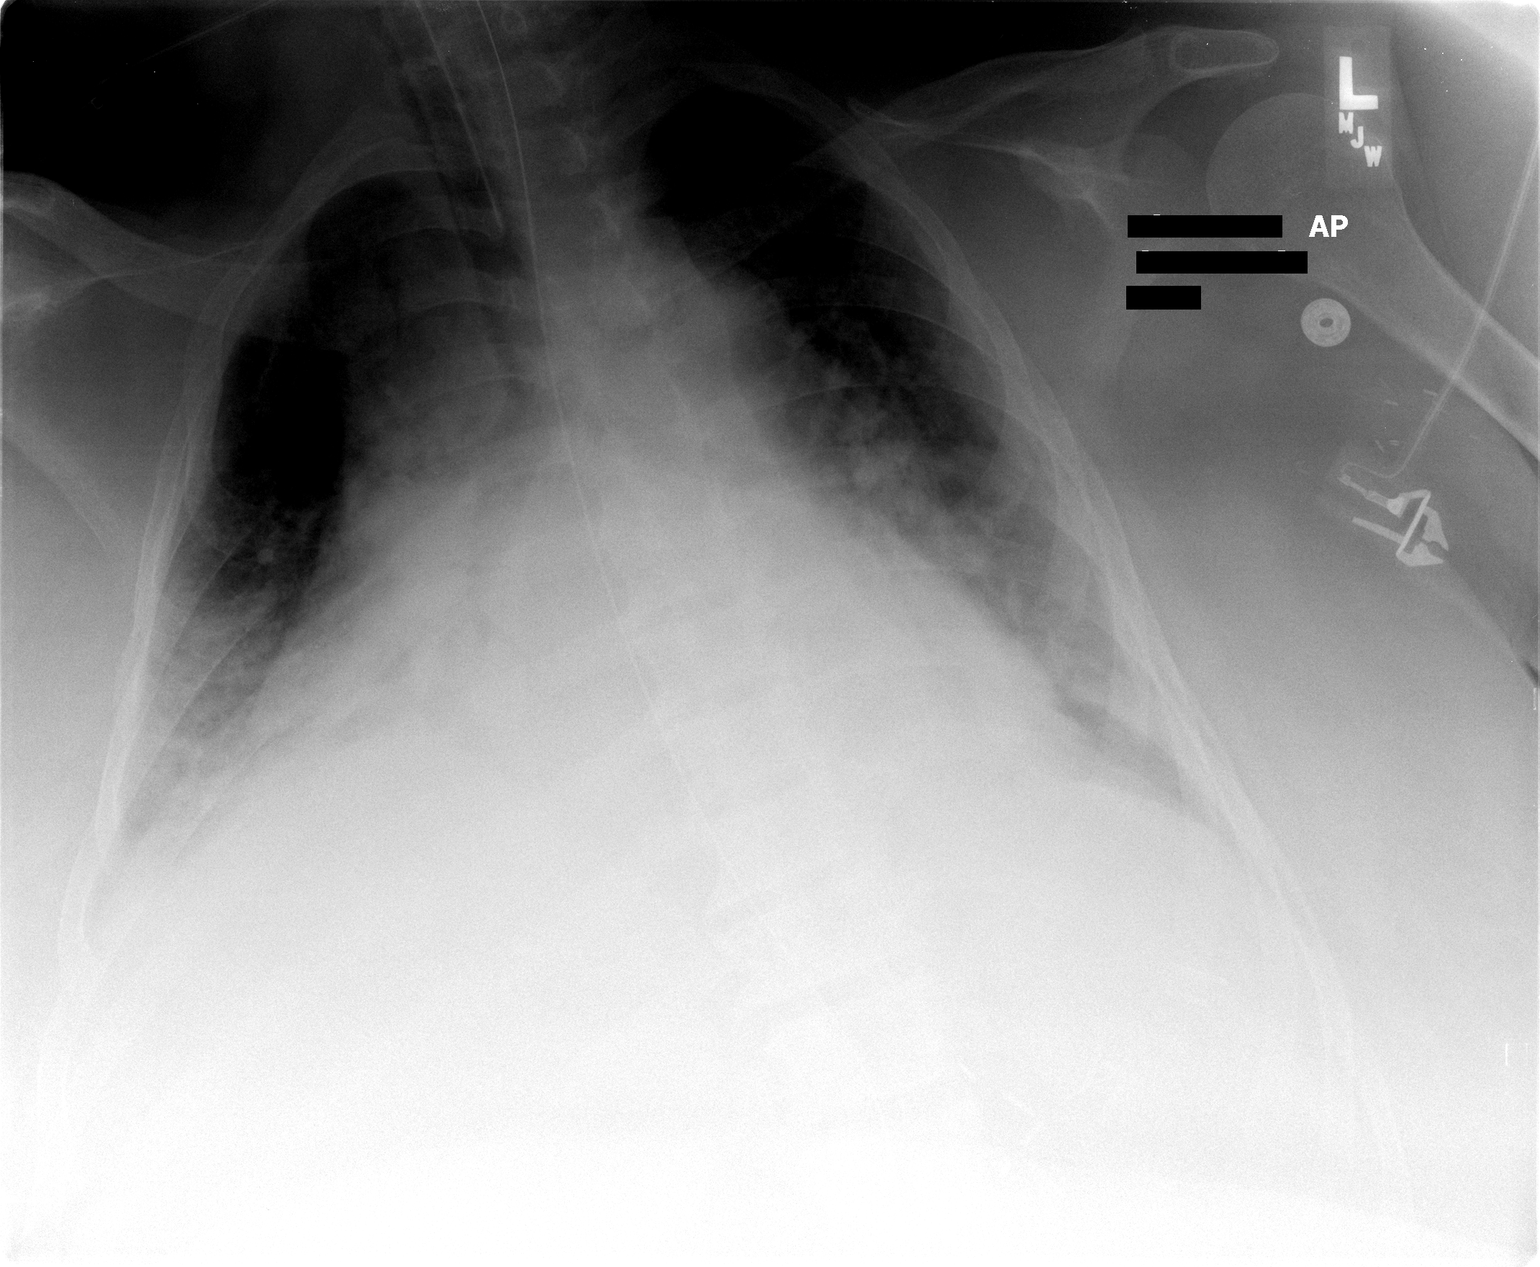

[1 of 1 positions shown; findings below may reference images not displayed]

FINDINGS: 0997 hours.  Endotracheal tube tip is 5.7 cm above the
base of the carina.  NG tube passes into the stomach although a
distal tip is not visualized.  The patient is rotated to the right.
The cardiopericardial silhouette is enlarged. Lung volumes are low.
Pulmonary vascular congestion with question mild interstitial
pulmonary edema. There is bibasilar atelectasis or infiltrate.
IMPRESSION: No substantial interval change.

Enlargement the cardiopericardial silhouette with pulmonary
vascular congestion.

Low lung volumes with bibasilar atelectasis or infiltrate.

## 2010-05-19 IMAGING — CR DG CHEST 1V PORT
1 series · 1 of 1 positions shown · non-contrast
Comparison: 05/17/2008

CLINICAL DATA: Left arm abscess.  Respiratory difficulty.

PORTABLE CHEST - 1 VIEW

[view not recorded]
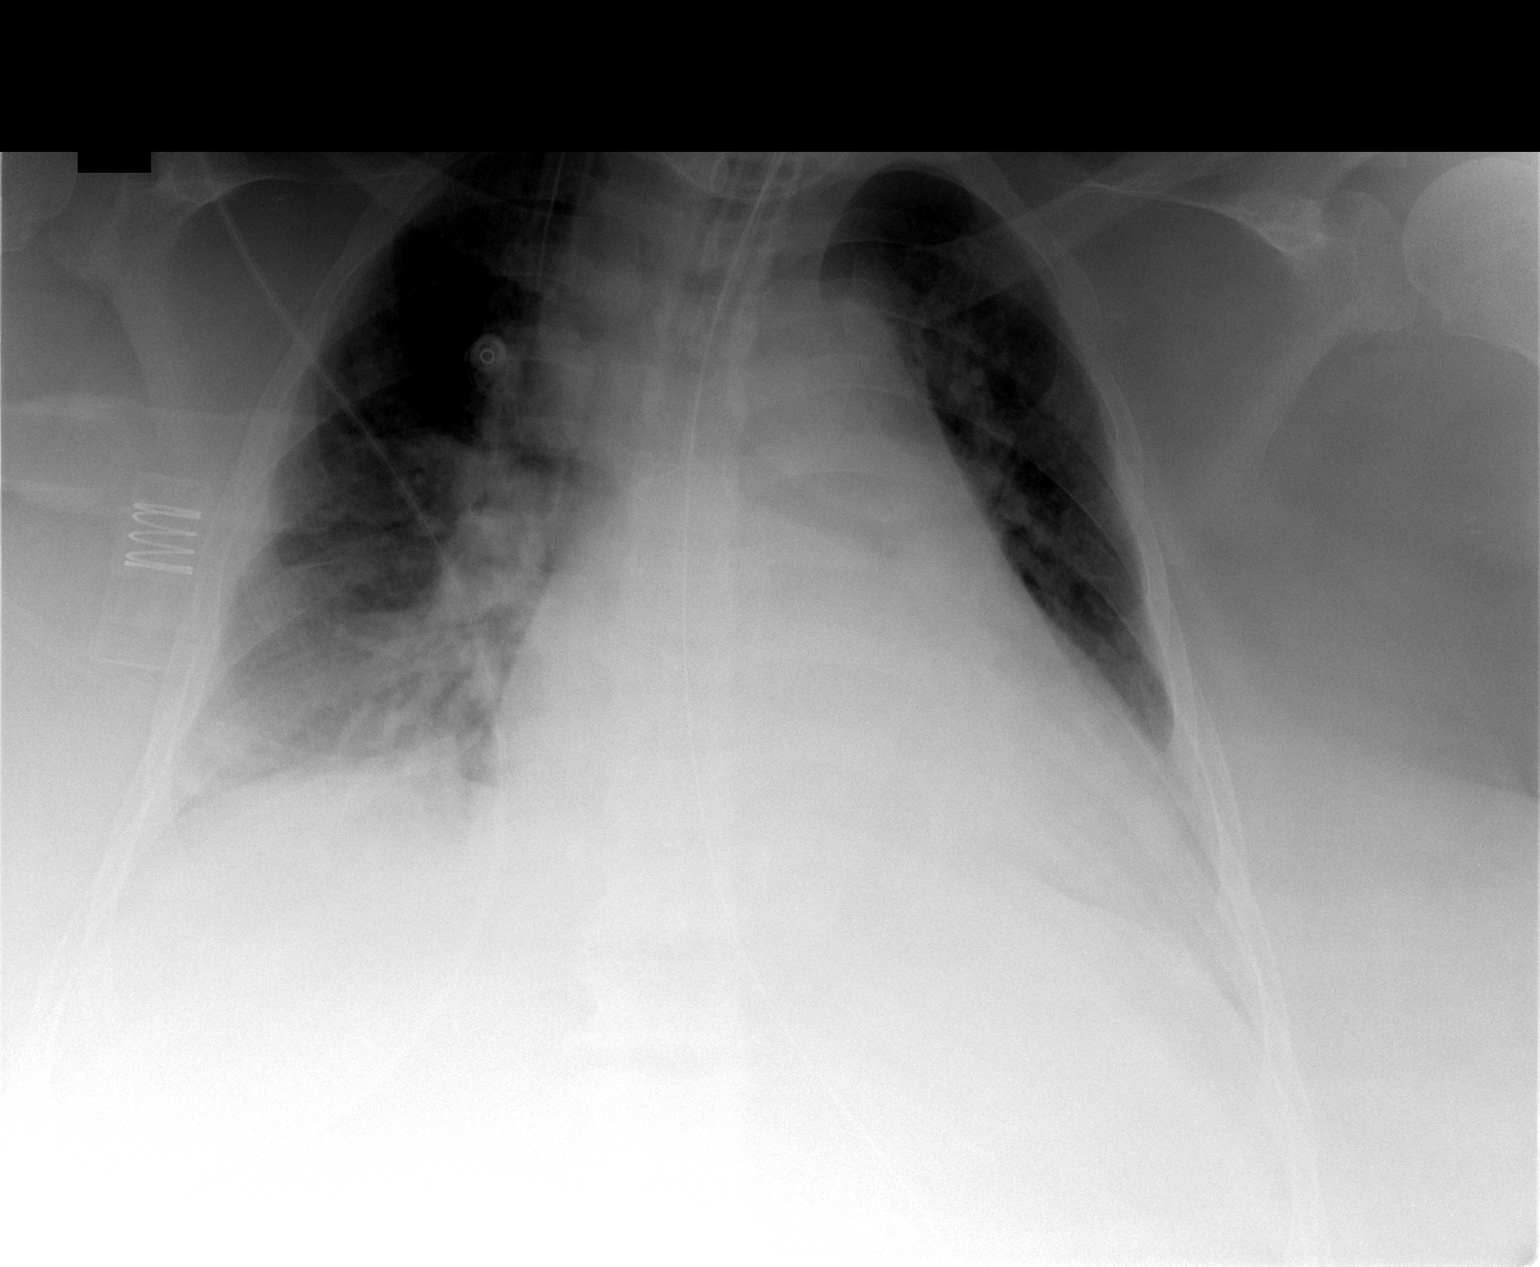

[1 of 1 positions shown; findings below may reference images not displayed]

FINDINGS: Cardiomegaly and vascular congestion are stable.  Right
lower lobe collapse has improved.  Low lung volumes are noted over
wall.  No pneumothorax or effusion.
IMPRESSION: Stable cardiomegaly and vascular congestion.  Improved right lower
lobe collapse.

## 2010-05-20 IMAGING — CR DG CHEST 1V PORT
1 series · 1 of 1 positions shown · non-contrast
Comparison: 05/18/2008

CLINICAL DATA: Left arm abscess

PORTABLE CHEST - 1 VIEW

[view not recorded]
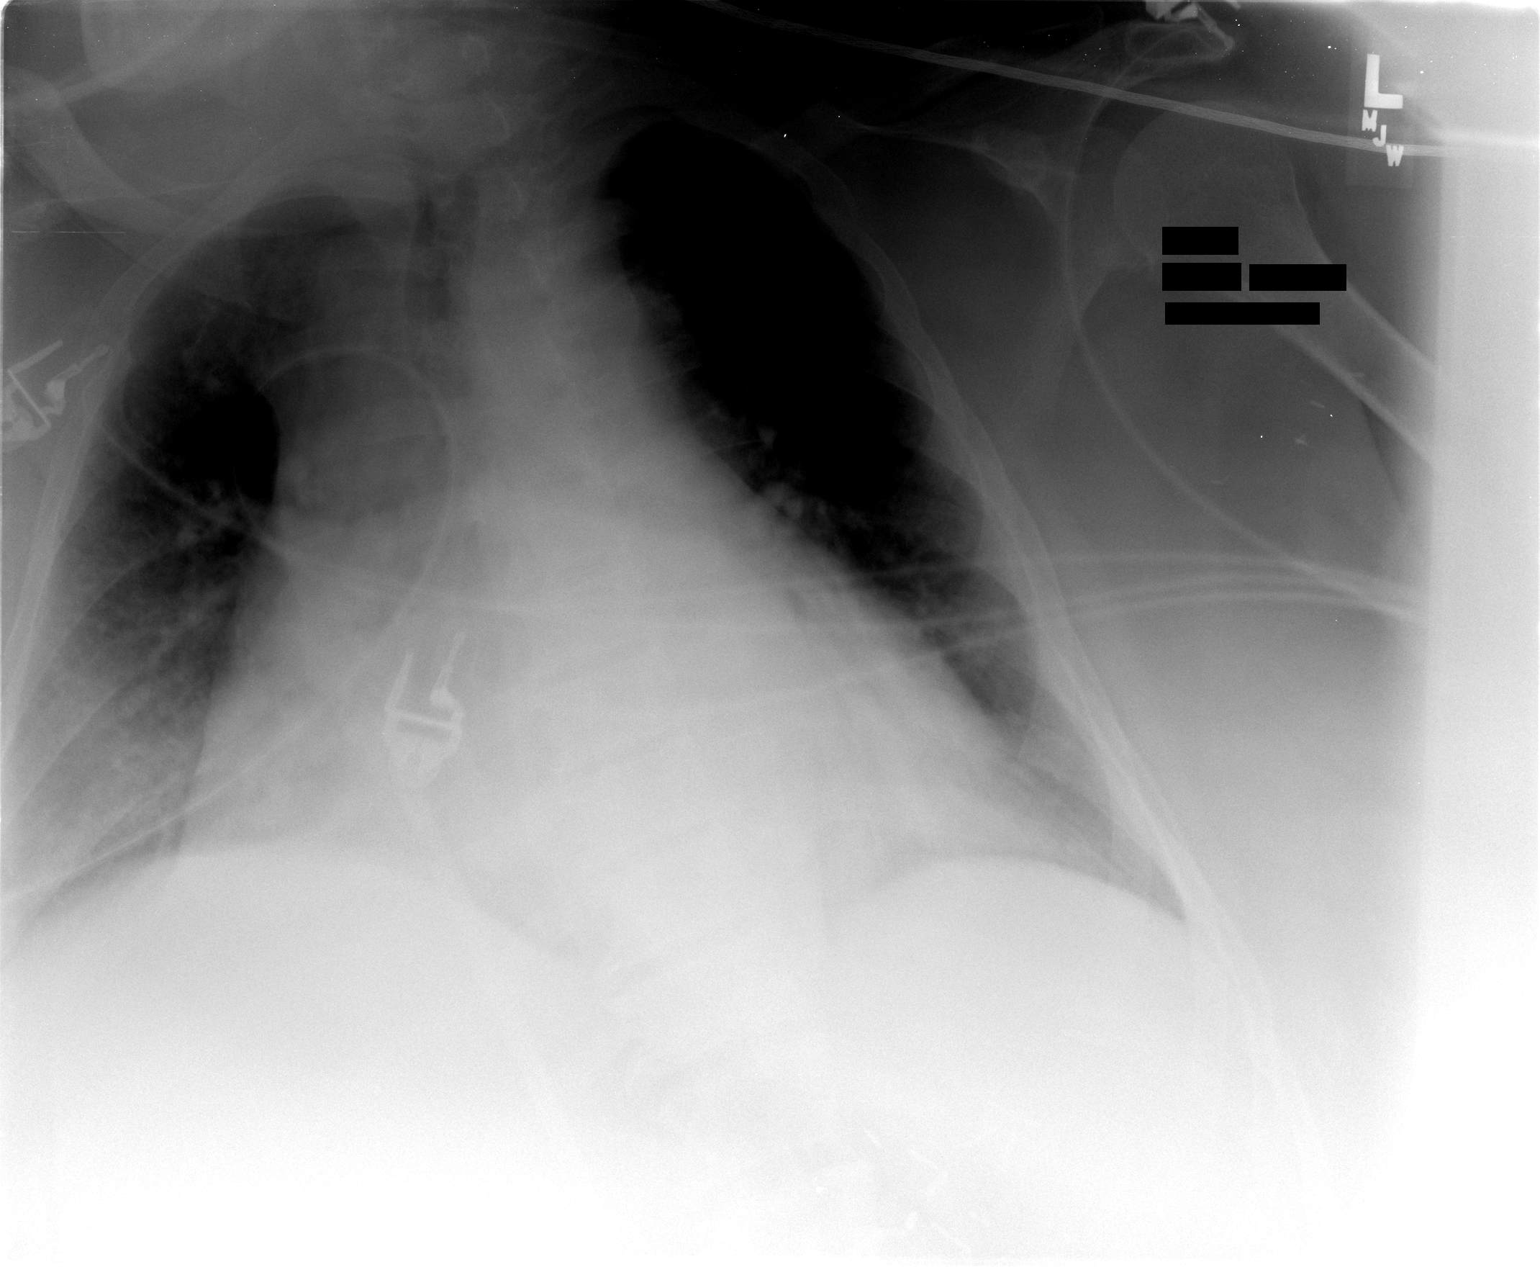

[1 of 1 positions shown; findings below may reference images not displayed]

FINDINGS: Cardiomegaly persists with without frank congestive heart
failure at this time.  There is still some right lower lobe
airspace density that has not appreciably changed.  The right
costophrenic angle is not included on this exam.
IMPRESSION: 1.  Cardiomegaly without frank congestive heart failure.
2.  Right lower lobe atelectasis about the same.
3.  No new findings.

## 2010-06-01 ENCOUNTER — Ambulatory Visit (HOSPITAL_COMMUNITY): Admission: RE | Admit: 2010-06-01 | Discharge: 2010-06-01 | Payer: Self-pay | Admitting: Nephrology

## 2010-06-02 ENCOUNTER — Encounter: Payer: Self-pay | Admitting: Internal Medicine

## 2010-06-15 ENCOUNTER — Emergency Department (HOSPITAL_COMMUNITY)
Admission: EM | Admit: 2010-06-15 | Discharge: 2010-06-16 | Payer: Self-pay | Source: Home / Self Care | Admitting: Emergency Medicine

## 2010-06-20 ENCOUNTER — Emergency Department (HOSPITAL_COMMUNITY)
Admission: EM | Admit: 2010-06-20 | Discharge: 2010-06-20 | Payer: Self-pay | Source: Home / Self Care | Admitting: Emergency Medicine

## 2010-06-21 ENCOUNTER — Inpatient Hospital Stay (HOSPITAL_COMMUNITY): Admission: EM | Admit: 2010-06-21 | Discharge: 2010-06-24 | Payer: Self-pay | Source: Home / Self Care

## 2010-06-26 ENCOUNTER — Emergency Department (HOSPITAL_COMMUNITY)
Admission: EM | Admit: 2010-06-26 | Discharge: 2010-06-26 | Payer: Self-pay | Source: Home / Self Care | Admitting: Emergency Medicine

## 2010-06-27 ENCOUNTER — Inpatient Hospital Stay (HOSPITAL_COMMUNITY)
Admission: EM | Admit: 2010-06-27 | Discharge: 2010-07-01 | Payer: Self-pay | Source: Home / Self Care | Attending: Pulmonary Disease | Admitting: Pulmonary Disease

## 2010-06-30 IMAGING — CR DG CHEST 2V
1 series · 1 of 1 positions shown · non-contrast
Comparison: Portable chest 05/19/2008.

CLINICAL DATA: Fever.

CHEST - 2 VIEW

[w chest lat]
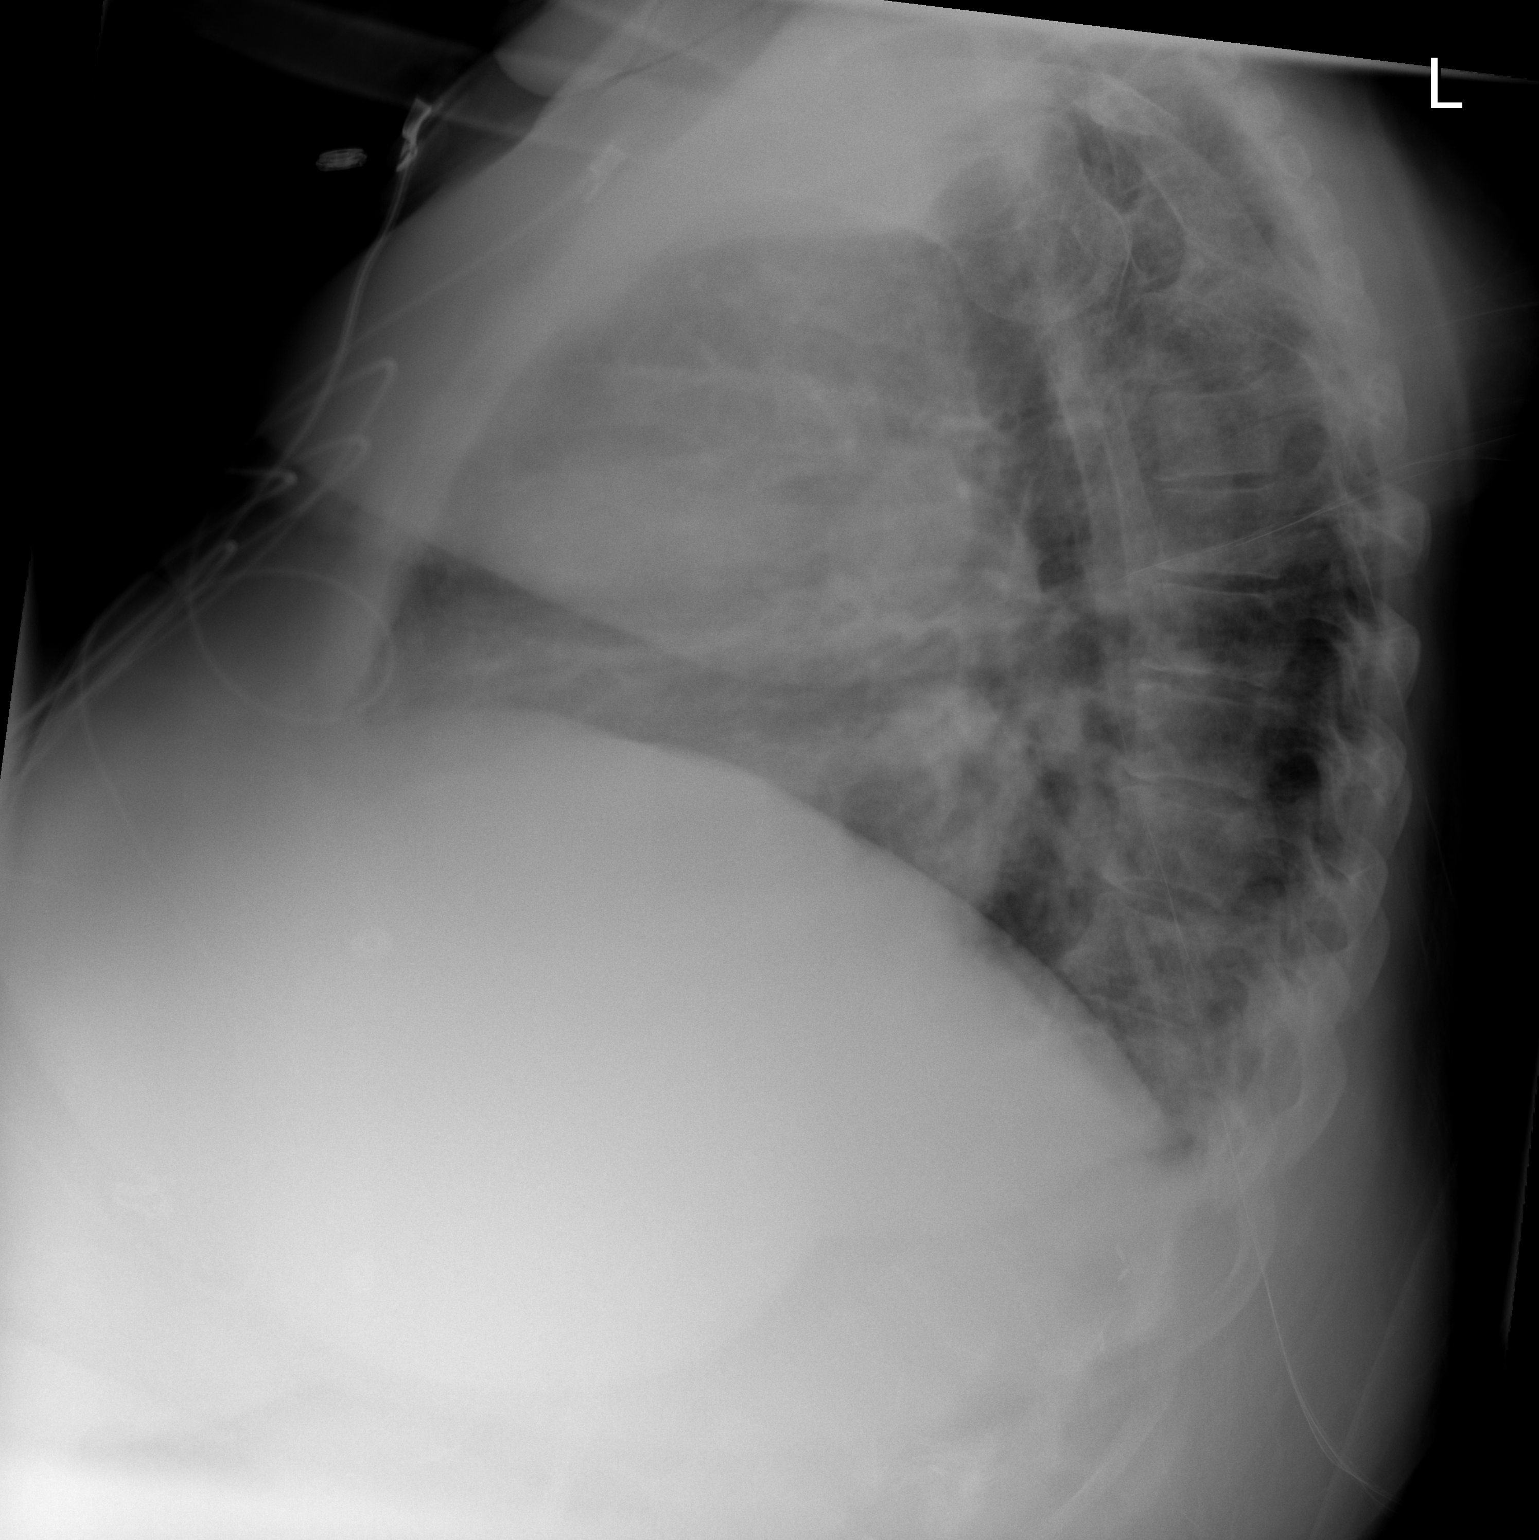

[1 of 1 positions shown; findings below may reference images not displayed]

FINDINGS: Study is limited by the patient's large body habitus.
Lungs are grossly clear.  There is cardiomegaly.
IMPRESSION: Limited study demonstrating no acute abnormality.

## 2010-07-01 IMAGING — CT CT CHEST W/O CM
2 of 6 series · 15 of 46 positions shown, 17 images · non-contrast
Comparison: 09/12/2007 and earlier studies

CT CHEST

CLINICAL DATA: Fever.  Hypotension during dialysis.

CT CHEST, ABDOMEN AND PELVIS WITHOUT CONTRAST
TECHNIQUE: Multidetector CT imaging of the chest, abdomen and
pelvis was performed following the standard protocol without IV
contrast.

[Series 5: abd/pel 5.0 b31f · axial · 0.98mm/px · z∈[-856,-420]mm · 12 of 99 slices shown, 14 images]
[im 6/99  soft-tissue]
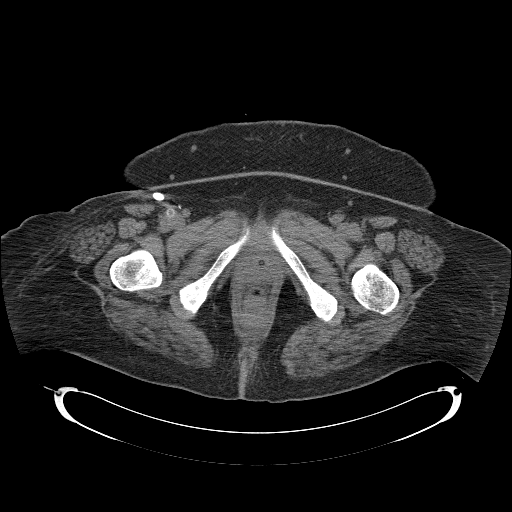
[im 6/99  bone]
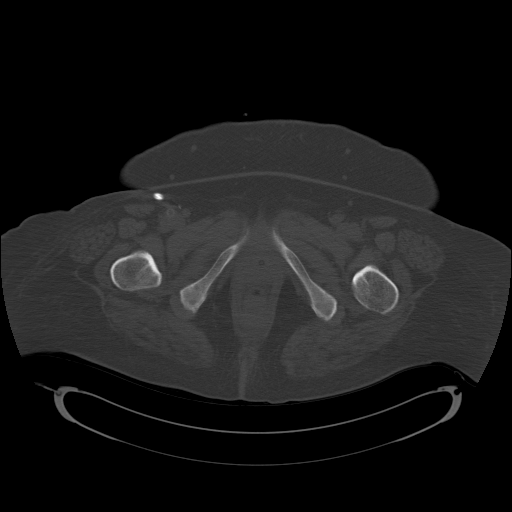
[im 16/99  soft-tissue]
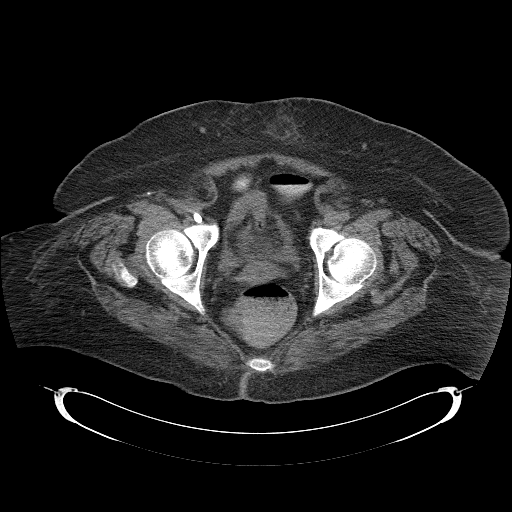
[im 21/99  soft-tissue]
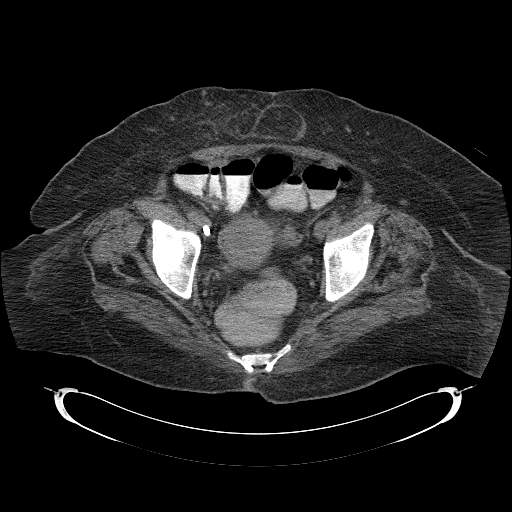
[im 31/99  soft-tissue]
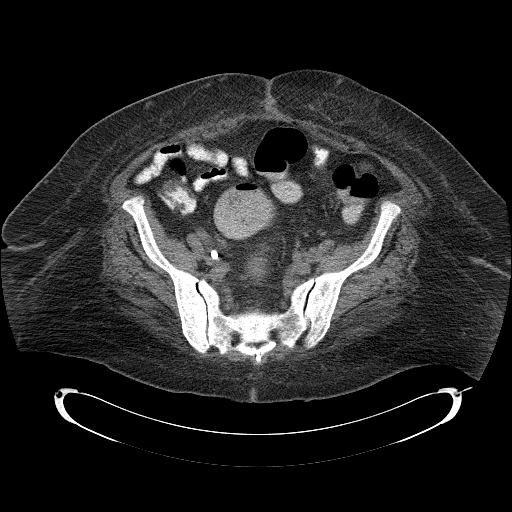
[im 37/99  soft-tissue]
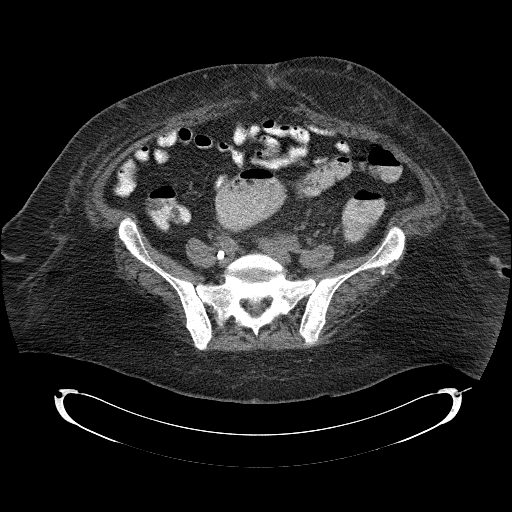
[im 47/99  soft-tissue]
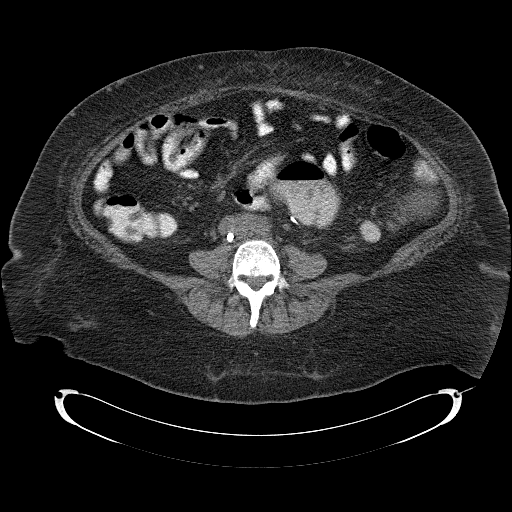
[im 52/99  soft-tissue]
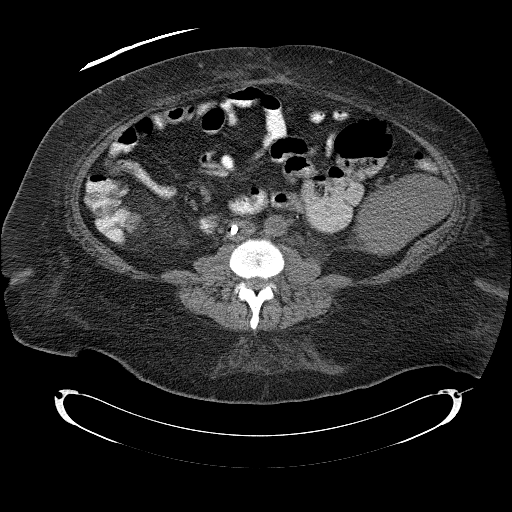
[im 62/99  soft-tissue]
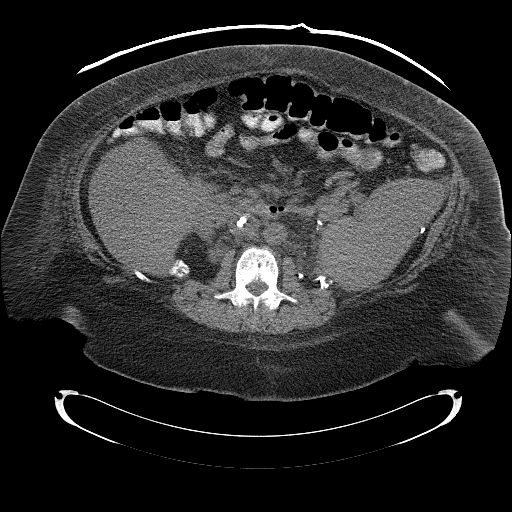
[im 68/99  soft-tissue]
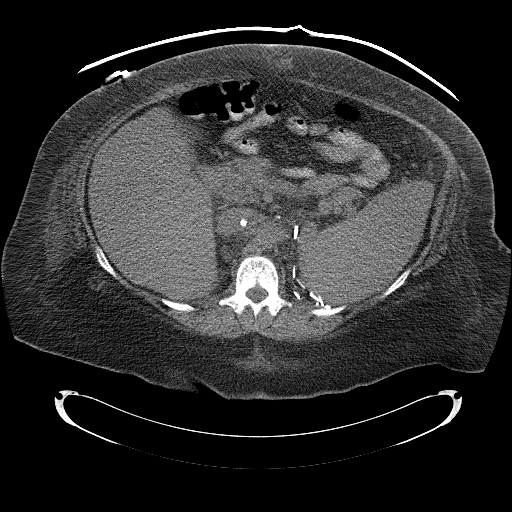
[im 68/99  bone]
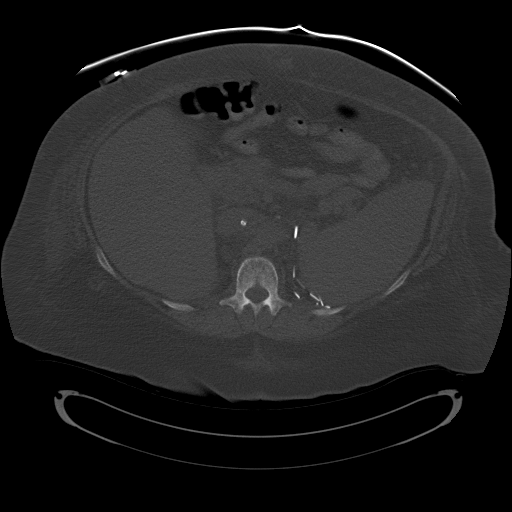
[im 78/99  soft-tissue]
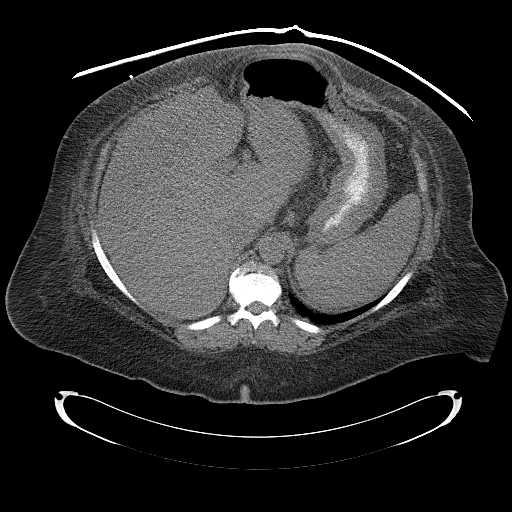
[im 83/99  soft-tissue]
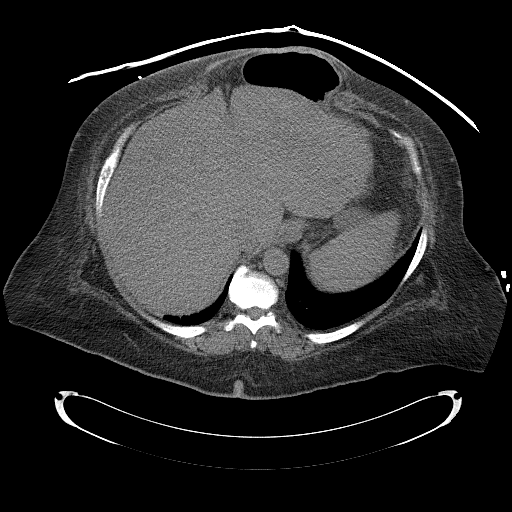
[im 93/99  soft-tissue]
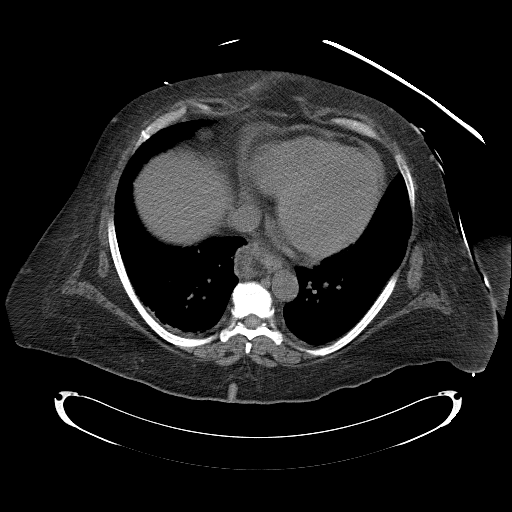

[Series 604: coronal a/p · coronal · 0.98mm/px · 3 of 103 slices shown]
[im 26/103  soft-tissue]
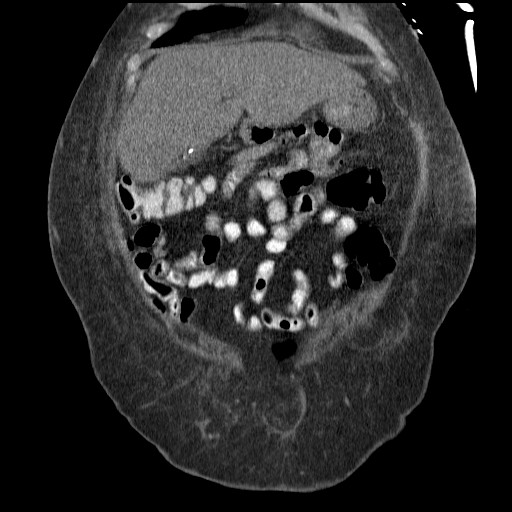
[im 52/103  soft-tissue]
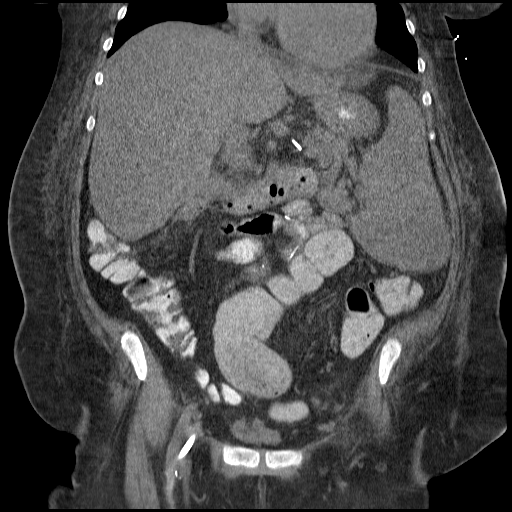
[im 77/103  soft-tissue]
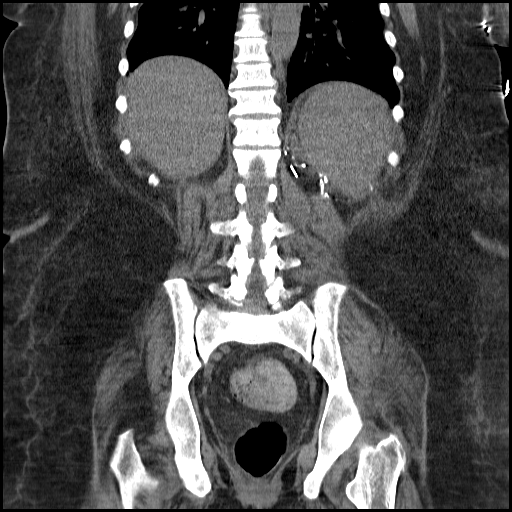

[15 of 46 positions shown; findings below may reference images not displayed]

FINDINGS: Small pericardial effusion or pericardial thickening.
No pleural effusion.  Small hiatal hernia.  Sub centimeter right
paratracheal, precarinal, and prevascular lymph nodes.  No definite
hilar adenopathy, sensitivity decreased without IV contrast.  There
is minimal dependent atelectasis in the right lower lobe.
Scattered nodular sub centimeter subpleural densities are noted
bilaterally.  Largest on the left in the medial aspect of the
anterior segment left upper lobe 8 mm diameter, image 14.  Largest
on the right, anteriorly in the right middle lobe image 35, 7 mm.
No confluent airspace infiltrate.  No pneumothorax.  Scattered sub
centimeter blebs are noted throughout both lungs.
IMPRESSION: 1.  Small pericardial effusion or pericardial thickening.
2.  Bilateral small subpleural nonspecific nodules. If the patient
is at high risk for bronchogenic carcinoma, follow-up chest CT at 3-
6 months is recommended.  If the patient is at low risk for
bronchogenic carcinoma, follow-up chest CT at 6-12 months is
recommended.  This recommendation follows the consensus statement:
"Guidelines for Management of Small Pulmonary Nodules Detected on
CT Scans: A Statement from the [HOSPITAL]" as published in
[URL]
3.  Small hiatal hernia.

CT ABDOMEN
FINDINGS: There are least three ventral hernias.  The most
cephalad is in the epigastrium and involves a portion of the
gastric body.  Two smaller periumbilical hernias contain only
mesenteric fat.  Unremarkable uninfused evaluation of liver and
pancreas.  There is   splenomegaly which has developed since the
previous exam, spleen measuring 18.4 cm craniocaudal length.
Changes of left nephrectomy.  At the margin of the nephrectomy bed
is a 10 x 70 mm crescentic region of soft tissue attenuation
material which extends medial to the spleen.  In the left
subphrenic space is a low attenuation 5.7 cm focus which is new
since the previous scan.  There are streaky infiltrate/edematous
changes in the left para-aortic retroperitoneal fat as before.  A
2.2 soft tissue attenuation nodule projects in the right
retroperitoneum;  lateral to this, medial to the posterior right
hepatic segment is partially calcified 18 mm nodule which is stable
since the prior exam.  Hemodialysis catheter tips project in the
infrarenal intrahepatic IVC.  The small bowel is decompressed.  No
free air.  No ascites.  No adenopathy.  Vascular clips in the
gallbladder fossa.
IMPRESSION: 1.  Interval development of splenomegaly.
2.  Persistent soft tissue attenuation material in the left
nephrectomy bed.
3.  New 5.7 cm left subphrenic mass.
4.  Multiple ventral hernias.
5.  Postoperative changes as above.

CT PELVIS
FINDINGS: The colon is nondilated, unremarkable.  Urinary bladder
is decompressed by Foley catheter.  Uterus and adnexal regions are
unremarkable.  Right femoral hemodialysis catheter and right thigh
dialysis graft are partially seen.  No free fluid.  No adenopathy.
There is a diffuse sclerotic appearance of the pelvis and lumbar
spine.
IMPRESSION: 1.  No acute pelvic process.
2.  Postoperative changes as above.
3.  Persistent   sclerosis in the pelvis and lumbar spine
suggesting renal osteodystrophy.

## 2010-07-06 IMAGING — CR DG ABD PORTABLE 1V
1 series · 1 of 1 positions shown · non-contrast
Comparison: 07/01/2008

CLINICAL DATA: Assess position of right femoral dialysis catheter -
accidentally pulled partially out

ABDOMEN - 1 VIEW

[AP]
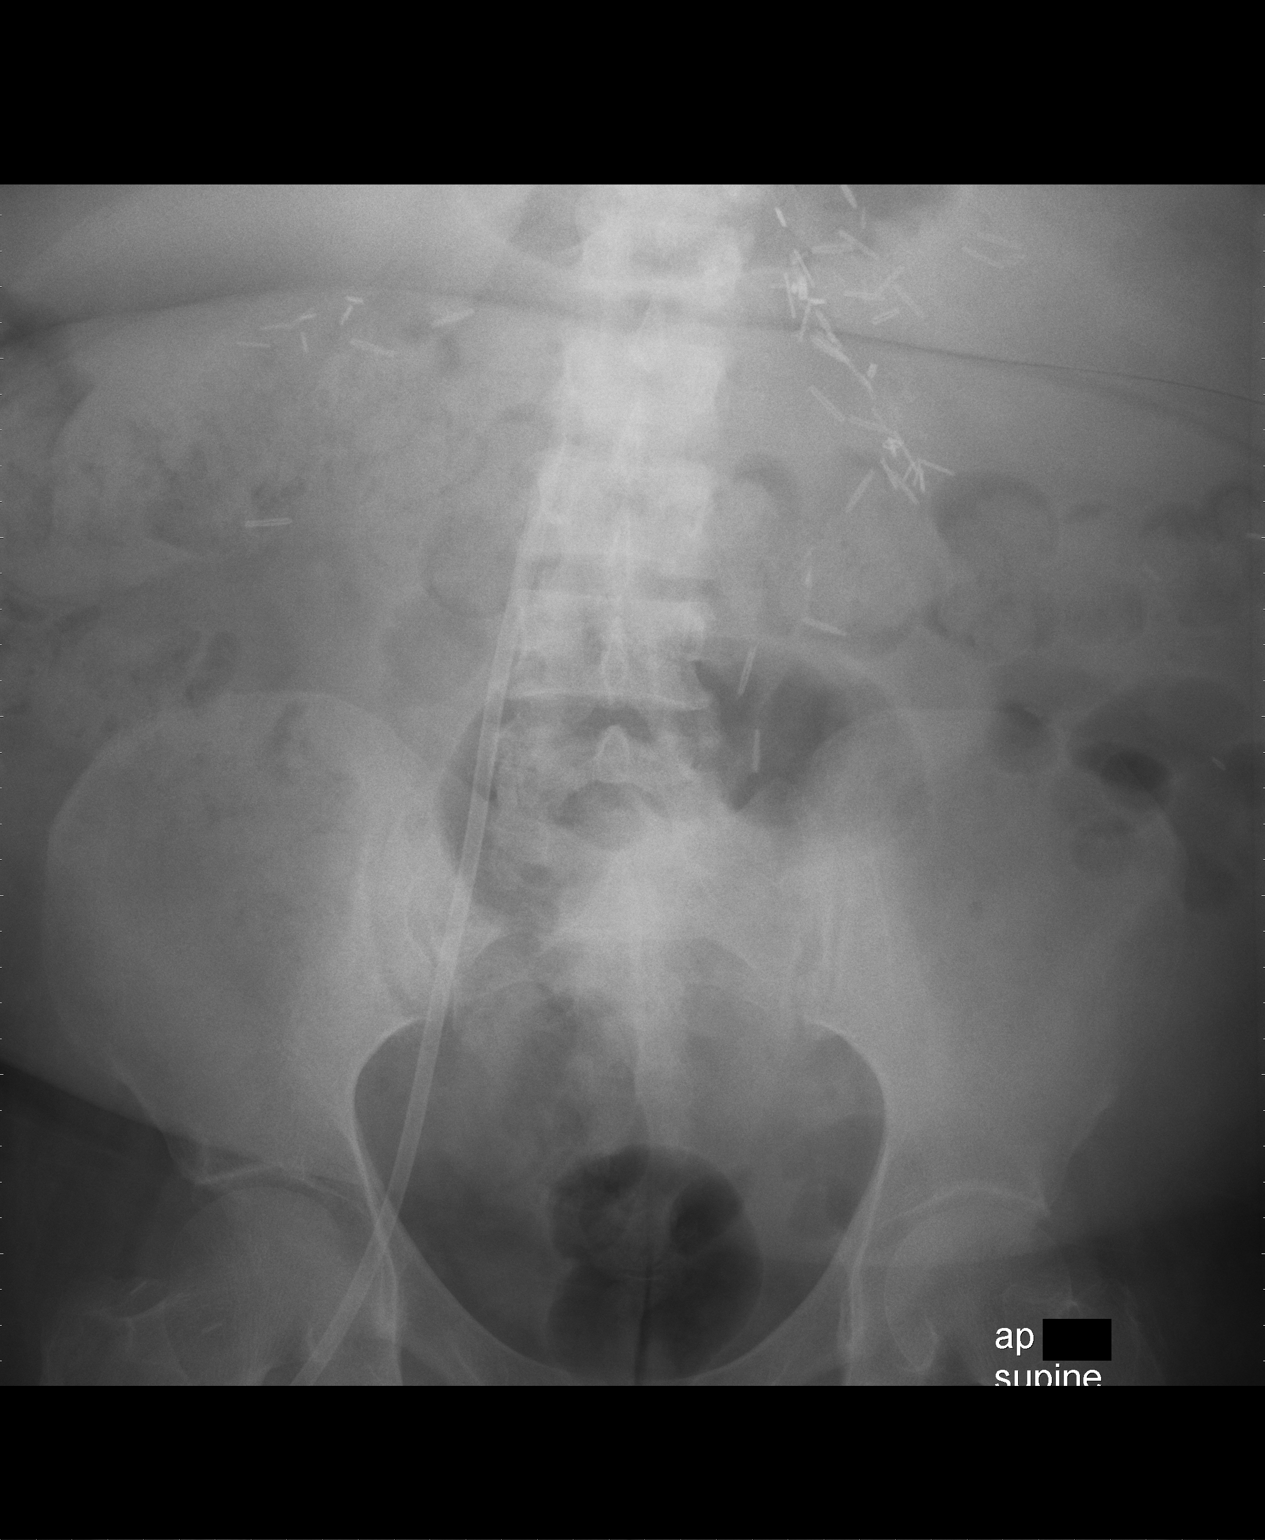

[1 of 1 positions shown; findings below may reference images not displayed]

FINDINGS: The distal tip of the right femoral dialysis catheter,
projects over the L2-L3 disc space.  When placed on 07/01/2008, the
tips were at the junction of the IVC and right atrium, projecting
over the lower thoracic spine.
IMPRESSION: The right femoral dialysis catheter has been significantly pulled
back - the distal tip is now positioned at approximately L2-L3.

## 2010-07-07 IMAGING — CR DG ABD PORTABLE 1V
1 series · 1 of 1 positions shown · non-contrast
Comparison: 07/05/2008.

CLINICAL DATA: Catheter exchanged for dialysis.

ABDOMEN - 1 VIEW

[view not recorded]
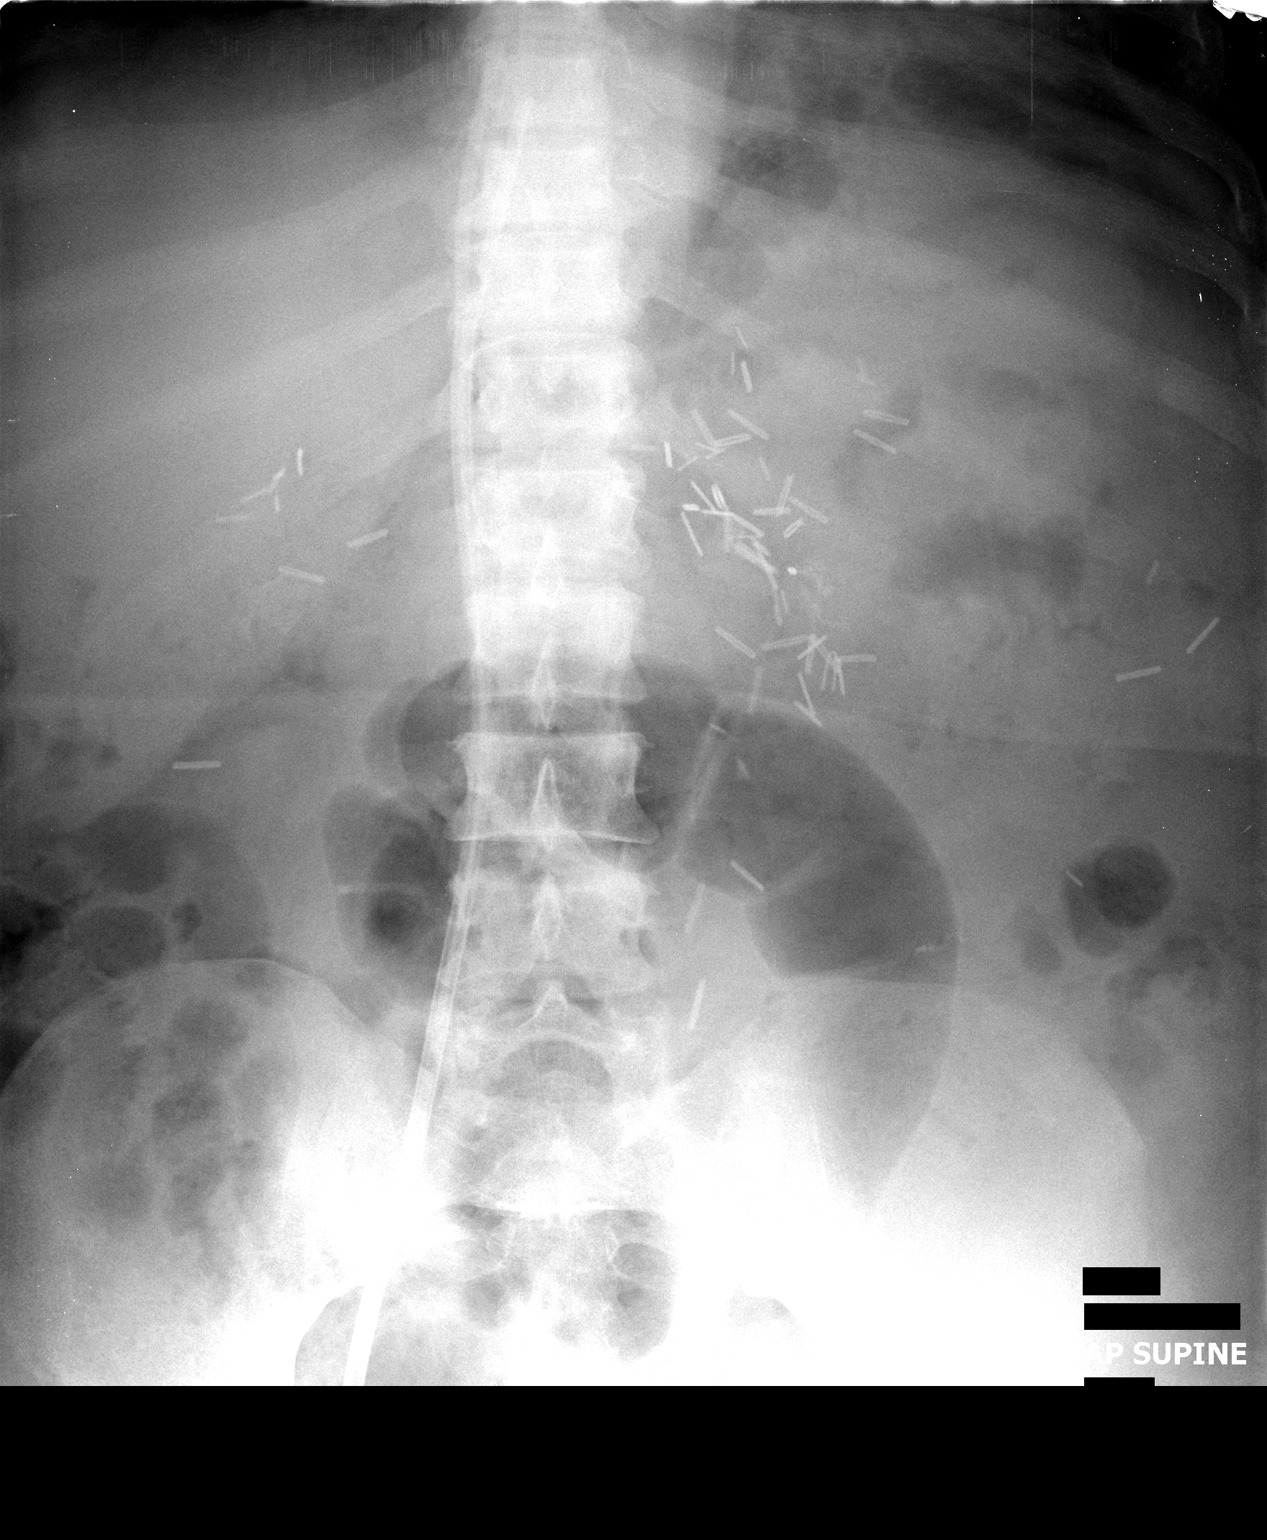

[1 of 1 positions shown; findings below may reference images not displayed]

FINDINGS: Right femoral catheter has been exchanged.  Both hips are
now in the right atrium.

Numerous surgical clips are present in the abdomen.  The sigmoid
colon is mildly distended but there is no bowel obstruction.
IMPRESSION: Dual lumen catheter tips are both in the right atrium.

## 2010-07-13 ENCOUNTER — Telehealth: Payer: Self-pay | Admitting: Internal Medicine

## 2010-07-21 ENCOUNTER — Ambulatory Visit
Admission: RE | Admit: 2010-07-21 | Discharge: 2010-07-21 | Payer: Self-pay | Source: Home / Self Care | Attending: Internal Medicine | Admitting: Internal Medicine

## 2010-08-05 ENCOUNTER — Encounter: Payer: Self-pay | Admitting: Vascular Surgery

## 2010-08-06 ENCOUNTER — Encounter: Payer: Self-pay | Admitting: Nephrology

## 2010-08-15 NOTE — Progress Notes (Signed)
  Phone Note From Other Clinic   Caller: Crystal from [facility] Call For: Dr Lovell Sheehan Summary of Call: Insurance not paying for Renagel- pharmacy bill getting high.  Need alternative. phone [phone] Initial call taken by: Raechel Ache, RN,  September 02, 2009 8:28 AM  Follow-up for Phone Call        renegal is ordered through nephrolgist who does her dialysis- we have nothing to do with it- needs to call dialysis or nephrologist Follow-up by: Willy Eddy, LPN,  September 02, 2009 8:43 AM  Additional Follow-up for Phone Call Additional follow up Details #1::        Facility notified. Additional Follow-up by: Lynann Beaver CMA,  September 02, 2009 9:11 AM

## 2010-08-15 NOTE — Miscellaneous (Signed)
Summary: [place], Physician's Orders/[facility]  [place], Physician's Orders/[facility]   Imported By: Maryln Gottron 10/21/2009 13:49:21  _____________________________________________________________________  External Attachment:    Type:   Image     Comment:   External Document

## 2010-08-15 NOTE — Miscellaneous (Signed)
Summary: [place] Forms/[facility]  [place] Forms/[facility]   Imported By: Maryln Gottron 10/17/2009 15:06:44  _____________________________________________________________________  External Attachment:    Type:   Image     Comment:   External Document

## 2010-08-15 NOTE — Miscellaneous (Signed)
Summary: [facility] Orders/[facility]  Physician's Orders/[facility]   Imported By: Maryln Gottron 02/17/2010 14:47:07  _____________________________________________________________________  External Attachment:    Type:   Image     Comment:   External Document

## 2010-08-15 NOTE — Miscellaneous (Signed)
Summary: [facility]ician's Orders/[facility]  Physician's Orders/[facility]   Imported By: Maryln Gottron 01/30/2010 13:01:50  _____________________________________________________________________  External Attachment:    Type:   Image     Comment:   External Document

## 2010-08-15 NOTE — Progress Notes (Signed)
Summary: fyi  Phone Note Call from Patient   Caller: Patient Call For: Stacie Glaze MD Summary of Call: [facility] # [phone] per pt due to bp low Initial call taken by: Heron Sabins,  October 25, 2009 1:14 PM  Follow-up for Phone Call        dr Lovell Sheehan is aware Follow-up by: Willy Eddy, LPN,  October 25, 2009 2:32 PM

## 2010-08-15 NOTE — Assessment & Plan Note (Signed)
Summary: rov/mbw   Visit Type:  Follow-up Copy to:  Dr. Darryll Capers Primary Provider/Referring Provider:  Dr. Darryll Capers  CC:  Pt here for follow-up. pt states SOB is improving. She has been wearing her Cpap some and buthas not f/u with Dr. Craige Cotta yet. Marland Kitchen  History of Present Illness: OV 09/06/2009: Followup for Dyspnea after PFTs and subpleural pulmonary nodules and OSA. Complex and very likely multifactorial dyspnea. Hard to pinpoint etiology. Complicated by very poor history. possibilities include: obesity, deconditioning, asthma, volume retetnion with dialyusis.  No changes since last visit. Had PFTs today - uninterpretable due to poor comprehension with maneuvers and testing. Has not followed up with Dr Craige Cotta for sleep apnea.   Allergies: 1)  ! Medrol 2)  ! Cinda Quest  Past History:  Past Medical History: Reviewed history from 06/03/2009 and no changes required. 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 Family History Diabetes 1st degree relative Family History Hypertension Family History Other cancer-breast  Social History: Reviewed history from 06/03/2009 and no changes required. Single lives is group home Never Smoked Passive Smoking: Dad from birth to age 76  Review of Systems       The patient complains of dyspnea on exertion.  The patient denies anorexia, fever, weight loss, weight gain, vision loss, decreased hearing, hoarseness, chest pain, syncope, peripheral edema, prolonged cough, headaches,  hemoptysis, abdominal pain, melena, hematochezia, severe indigestion/heartburn, hematuria, incontinence, genital sores, muscle weakness, suspicious skin lesions, transient blindness, difficulty walking, depression, unusual weight change, abnormal bleeding, enlarged lymph nodes, angioedema, breast masses, and testicular masses.    Vital Signs:  Patient profile:   55 year old female Height:      67 inches Weight:      303 pounds O2 Sat:      98 % on Room air Temp:     97.7 degrees F oral Pulse rate:   75 / minute BP sitting:   110 / 70  (right arm) Cuff size:   regular  Vitals Entered By: Carron Curie CMA (September 06, 2009 4:04 PM)  O2 Flow:  Room air CC: Pt here for follow-up. pt states SOB is improving. She has been wearing her Cpap some, buthas not f/u with Dr. Craige Cotta yet.  Comments Medications reviewed with patient Carron Curie CMA  September 06, 2009 4:05 PM Daytime phone number verified with patient.    Physical Exam  General:  alert and overweight-appearing.   Head:  normocephalic.   Eyes:  pupils equal and pupils round.   Ears:  R ear normal and L ear normal.   Nose:  no external deformity.   Mouth:  pharynx pink and moist and no erythema.   Neck:  No deformities, masses, or tenderness noted. Lungs:  normal respiratory effort and no intercostal retractions.   Heart:  normal rate and regular rhythm.   Abdomen:  soft, distended, umbilical hernia, and incisional hernia.   Msk:  no joint swelling and no joint warmth.   Pulses:  R and L carotid,radial,femoral,dorsalis pedis and posterior tibial pulses are full and equal bilaterally  Extremities:  1+ left pedal edema and 1+ right pedal edema.   Neurologic:  alert & oriented X3 and DTRs symmetrical and normal.   Skin:  ruddy.  skin changes of tuberosities Cervical Nodes:  No lymphadenopathy noted Axillary Nodes:  No palpable lymphadenopathy Psych:  Oriented X3.     CT of Chest  Procedure date:   06/30/2008  Findings:       2.  Bilateral small subpleural nonspecific nodules. If the patient   is at high risk for bronchogenic carcinoma, follow-up chest CT at 3-   6 months is recommended.  If the patient is at low risk for   bronchogenic carcinoma, follow-up chest CT at 6-12 months is   recommended.  This recommendation follows the consensus statement:   Guidelines for Management of Small Pulmonary Nodules Detected on   CT Scans: A Statement from the Fleischner Society as published in   Radiology 2005; 237:395-400. Online at:  Comments:      indepenetnly reviewed  Impression & Recommendations:  Problem # 1:  SHORTNESS OF BREATH (SOB) (ICD-786.05) Assessment Unchanged  Orders: Radiology Referral (Radiology) Est. Patient Level II ([address]109)  Hx of asthma. Hx of sleep apnea. Obese. Tuberous Sclerosis. Non smoker. 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 Non focal resp exam. PFTs useless. Baesd on abovve, I am unclear cuase of dyspnea  plan She cannot comprehend basic instructions like blowing into PFT machine. Best probabl to use some nebs for asthma empirically Will address at followup  Problem # 2:  LUNG NODULE (ICD-518.89) Assessment: Unchanged last ct dec 2009 showed some nodules. Will repeat ct now Orders: Radiology Referral (Radiology) Est. Patient Level II [id number])  Patient Instructions: 1)  please have ct scan chest 2)  return to see me after ct scan chest   Immunization History:  Pneumovax Immunization History:    Pneumovax:  pneumovax (07/17/2007)

## 2010-08-15 NOTE — Miscellaneous (Signed)
Summary: [facility]ician's Orders/[facility]  Physician's Orders/[facility]   Imported By: Maryln Gottron 05/10/2010 12:15:16  _____________________________________________________________________  External Attachment:    Type:   Image     Comment:   External Document

## 2010-08-15 NOTE — Progress Notes (Signed)
Summary: [facility] called req refill of pts Zofran 8mg   Phone Note From Other Clinic Call back at [phone] Crystal at [facility]   Caller: [facility] - [place] Summary of Call: Pt req refill of Zofran 8mg . Please fax to [facility] fax# [phone]  Initial call taken by: Lucy Antigua,  August 09, 2009 10:38 AM    New/Updated Medications: ZOFRAN 4 MG TABS (ONDANSETRON HCL) 1 every 6-8 hours as needed nausea and vomiting Prescriptions: ZOFRAN 4 MG TABS (ONDANSETRON HCL) 1 every 6-8 hours as needed nausea and vomiting  #15 x 0   Entered by:   Willy Eddy, LPN   Authorized by:   Stacie Glaze MD   Signed by:   Willy Eddy, LPN on 45/80/9983   Method used:   Print then Give to Patient   RxID:   [id number]

## 2010-08-15 NOTE — Miscellaneous (Signed)
Summary: [facility]ician's Orders/[facility]  Physician's Orders/[facility]   Imported By: Maryln Gottron 12/27/2009 13:55:10  _____________________________________________________________________  External Attachment:    Type:   Image     Comment:   External Document

## 2010-08-15 NOTE — Miscellaneous (Signed)
Summary: Physician's Orders/[facility]   Physician's Orders/[facility]   Imported By: Maryln Gottron 06/06/2010 12:18:34  _____________________________________________________________________  External Attachment:    Type:   Image     Comment:   External Document

## 2010-08-15 NOTE — Miscellaneous (Signed)
Summary: Physician's Orders/[facility]  Physician's Orders/[facility]   Imported By: Maryln Gottron 05/10/2010 15:26:52  _____________________________________________________________________  External Attachment:    Type:   Image     Comment:   External Document

## 2010-08-15 NOTE — Miscellaneous (Signed)
Summary: Standard Orders for Medication and Treatment/[facility]   Standard Orders for Medication and Treatment/[facility]   Imported By: Maryln Gottron 02/22/2010 15:20:10  _____________________________________________________________________  External Attachment:    Type:   Image     Comment:   External Document

## 2010-08-15 NOTE — Progress Notes (Signed)
Summary: bleeding hemorroids/ chest congestion   Phone Note Call from Patient Call back at Work Phone [phone]   Caller: Patient Call For: Stacie Glaze MD Reason for Call: Acute Illness Summary of Call: pt hemorroids are bleeding again and also chest congestion no better. Pt pharmacy [facility] in [place]. Initial call taken by: Heron Sabins,  July 21, 2009 11:00 AM  Follow-up for Phone Call        per dr Yvonne Kendall refill annusol for hemorrhoids an tell her to use de;sym otc 2 tsp two times a day for congestion Follow-up by: Willy Eddy, LPN,  July 21, 2009 12:16 PM    New/Updated Medications: DELSYM 30 MG/5ML LQCR (DEXTROMETHORPHAN POLISTIREX) as directed Prescriptions: ANALPRAM-HC 1-2.5 % CREA (HYDROCORTISONE ACE-PRAMOXINE) APPLY PER RECTUM two times a day  #1 TUBE x 3   Entered by:   Lynann Beaver CMA   Authorized by:   Stacie Glaze MD   Signed by:   Lynann Beaver CMA on 07/21/2009   Method used:   Electronically to        [facility]* (retail)       [address]       [place], [place]  [address]       Ph: [phone]       Fax: [phone]   RxID:   [id number] DELSYM 30 MG/5ML LQCR (DEXTROMETHORPHAN POLISTIREX) as directed  #1 bottle x prn   Entered by:   Lynann Beaver CMA   Authorized by:   Stacie Glaze MD   Signed by:   Lynann Beaver CMA on 07/21/2009   Method used:   Electronically to        [facility]* (retail)       [address]       [place], [place]  [address]       Ph: [phone]       Fax: [phone]   RxID:   [id number]  Pt. notified.

## 2010-08-15 NOTE — Miscellaneous (Signed)
Summary: Physician Orders/[facility]  Physician Orders/[facility]   Imported By: Maryln Gottron 08/31/2009 12:09:48  _____________________________________________________________________  External Attachment:    Type:   Image     Comment:   External Document

## 2010-08-15 NOTE — Progress Notes (Signed)
Summary: REQ FOR RX  (ALLEGRA)?  Phone Note Call from Patient   Caller: Patient   [phone] Reason for Call: Refill Medication Summary of Call: Pt called to req a refill on a med that she took in the past... Med:  Allergra.... Pt adv that she has been exp congestion, sinus presssure, stuffy nose... Pt adv that Rx can be sent to [facility].      New/Updated Medications: ALLEGRA 180 MG TABS (FEXOFENADINE HCL) 1 once daily Prescriptions: ALLEGRA 180 MG TABS (FEXOFENADINE HCL) 1 once daily  #30 x 1   Entered by:   Willy Eddy, LPN   Authorized by:   Stacie Glaze MD   Signed by:   Willy Eddy, LPN on 56/21/3086   Method used:   Electronically to        [facility]* (retail)       [address]       [place], [place]  [address]       Ph: [phone]       Fax: [phone]   RxID:   [id number]

## 2010-08-15 NOTE — Progress Notes (Signed)
Summary: pain from boil  Phone Note From Other Clinic   Caller: [facility],ebony, [phone] - vm Summary of Call: Boil that has burst in between buttocks.  What to do or give her for comfort or pain? Initial call taken by: Rudy Jew, RN,  May 03, 2010 2:47 PM  Follow-up for Phone Call        apply bactroban  ointment to site two times a day pain meds through dialysis center Follow-up by: Stacie Glaze MD,  May 03, 2010 4:29 PM  Additional Follow-up for Phone Call Additional follow up Details #1::        Informed [facility].  They need order for bactroban faxed to [phone].  Done.   Additional Follow-up by: Rudy Jew, RN,  May 03, 2010 4:52 PM    New/Updated Medications: BACTROBAN 2 % OINT (MUPIROCIN) Apply to affected area buttocks two times a day. Prescriptions: BACTROBAN 2 % OINT (MUPIROCIN) Apply to affected area buttocks two times a day.  #1 tube x 0   Entered by:   Rudy Jew, RN   Authorized by:   Stacie Glaze MD   Signed by:   Rudy Jew, RN on 05/03/2010   Method used:   Printed then faxed to ...       [facility]* (retail)       [address]       [place], [place]  [address]       Ph: [phone]       Fax: [phone]   RxID:   [id number]

## 2010-08-15 NOTE — Assessment & Plan Note (Signed)
Summary: fu from er/pt fell/njr   Vital Signs:  Patient profile:   55 year old female Height:      67 inches Weight:      270 pounds BMI:     42.44 Temp:     98.2 degrees F oral Pulse rate:   80 / minute Resp:     14 per minute BP sitting:   140 / 80  (left arm) Cuff size:   large  Vitals Entered By: Willy Eddy, LPN (September 06, 2009 12:18 PM) CC: fell out of bed on 2-11 2 times and hit head- to er- this is f/u from er visit   Primary Care Provider:  Dr. Darryll Capers  CC:  fell out of bed on 2-11 2 times and hit head- to er- this is f/u from er visit.  History of Present Illness: Pt got up in the middle of the night and fell while walking she was taken to the ER for evaluation she is very unhappy with  [facility].... she feels that she is neglected.... she wants to get an apartment and have a live in aid no seisure reported going to dialysis for renal failure  asthma stable  Preventive Screening-Counseling & Management  Alcohol-Tobacco     Smoking Status: never     Passive Smoke Exposure: no  Problems Prior to Update: 1)  Lung Nodule  (ICD-518.89) 2)  Shortness of Breath (SOB)  (ICD-786.05) 3)  Depression  (ICD-311) 4)  Acute Bronchitis  (ICD-466.0) 5)  Constipation, Recurrent  (ICD-564.09) 6)  External Thrombosed Hemorrhoids  (ICD-455.4) 7)  Sleep Apnea, Obstructive  (ICD-327.23) 8)  Depression, Chronic, Severe  (ICD-296.33) 9)  Skin Tag  (ICD-701.9) 10)  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) 11)  Nevus, Atypical  (ICD-216.9) 12)  Allergic Rhinitis, Chronic  (ICD-477.9) 13)  Contact Dermatitis&other Eczema Due Detergents  (ICD-692.0) 14)  Tinea Corporis  (ICD-110.5) 15)  Gerd  (ICD-530.81) 16)  Cellulitis and Abscess of Upper Arm and Forearm  (ICD-682.3) 17)  Constipation, Slow Transit  (ICD-564.01) 18)  Seizure Disorder  (ICD-780.39) 19)  Renal Disease, End Stage  (ICD-585.6) 20)  Asthma  (ICD-493.90) 21)  Family History  Diabetes 1st Degree Relative  (ICD-V18.0) 22)  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) 23)  Mental Retardation  (ICD-319) 24)  Epilepsy, 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) 25)  Obesity, Morbid  (ICD-278.01)  Current Problems (verified): 1)  Shortness of Breath (SOB)  (ICD-786.05) 2)  Depression  (ICD-311) 3)  Acute Bronchitis  (ICD-466.0) 4)  Constipation, Recurrent  (ICD-564.09) 5)  External Thrombosed Hemorrhoids  (ICD-455.4) 6)  Sleep Apnea, Obstructive  (ICD-327.23) 7)  Depression, Chronic, Severe  (ICD-296.33) 8)  Skin Tag  (ICD-701.9) 9)  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) 10)  Nevus, Atypical  (ICD-216.9) 11)  Allergic Rhinitis, Chronic  (ICD-477.9) 12)  Contact Dermatitis&other Eczema Due Detergents  (ICD-692.0) 13)  Tinea Corporis  (ICD-110.5) 14)  Gerd  (ICD-530.81) 15)  Cellulitis and Abscess of Upper Arm and Forearm  (ICD-682.3) 16)  Constipation, Slow Transit  (ICD-564.01) 17)  Seizure Disorder  (ICD-780.39) 18)  Renal Disease, End Stage  (ICD-585.6) 19)  Asthma  (ICD-493.90) 20)  Family History Diabetes 1st Degree Relative  (ICD-V18.0) 21)  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 250 +  500 Two Times A Day 2)  Nexium 40 Mg  Cpdr (Esomeprazole Magnesium) .... One Daily 3)  Advair Diskus 250-50 Mcg/dose  Misc (Fluticasone-Salmeterol) .... Two Times A Day 4)  Nasonex   Susp (Mometasone Furoate Susp) .... Once Daily 5)  Topamax 200 Mg  Tabs (Topiramate) .... Two Times A Day 6)  Rena-Vite   Tabs (B Complex-C-Folic Acid) .... Once Daily 7)  Niferex-150 Forte 50-100 Mg  Caps (Fe-Succ Ac-C-Thre Ac-B12-Fa) .... Alternates With Ferri-Seque 1 Once Daily 8)  Miralax   Powd (Polyethylene Glycol 3350) .... Every Other Day 9)  Ketoconazole 2 %  Crea  (Ketoconazole) .... Apply Three Times A Day As Needed 10)  Klonopin 0.5 Mg  Tabs (Clonazepam) .... One By Mouth One Dialysis Days 11)  Mimyx   Crea (Emollient) .... Apply To Legs At Bedtime 12)  Cymbalta 30 Mg Cpep (Duloxetine Hcl) .Marland Kitchen.. 1 Two Times A Day 13)  Celebrex 200 Mg  Caps (Celecoxib) .... One Tab Two Times A Day As Needed 14)  Renagel 800 Mg Tabs (Sevelamer Hcl) .... 3 Three Times A Day and 1-2 Extra With Snack 15)  Phoslo 667 Mg Caps (Calcium Acetate (Phos Binder)) .... 2 With Meals Three Times A Day 1-2 Extra With Snacks 16)  Analpram-Hc 1-2.5 % Crea (Hydrocortisone Ace-Pramoxine) .... Apply Per Rectum Two Times A Day 17)  Dulcolax 10 Mg Supp (Bisacodyl) .... One Per Rectum Every 8 Hours As Needed For Constipation 18)  Delsym 30 Mg/48ml Lqcr (Dextromethorphan Polistirex) .... As Directed 19)  Zofran 4 Mg Tabs (Ondansetron Hcl) .Marland Kitchen.. 1 Every 6-8 Hours As Needed Nausea and Vomiting  Current Medications (verified): 1)  Mysoline   Tabs (Primidone Tabs) .... 250 +500 Two Times A Day 2)  Nexium 40 Mg  Cpdr (Esomeprazole Magnesium) .... One Daily 3)  Advair Diskus 250-50 Mcg/dose  Misc (Fluticasone-Salmeterol) .... Two Times A Day 4)  Nasonex   Susp (Mometasone Furoate Susp) .... Once Daily 5)  Topamax 200 Mg  Tabs (Topiramate) .... Two Times A Day 6)  Rena-Vite   Tabs (B Complex-C-Folic Acid) .... Once Daily 7)  Niferex-150 Forte 50-100 Mg  Caps (Fe-Succ Ac-C-Thre Ac-B12-Fa) .... Alternates With Ferri-Seque 1 Once Daily 8)  Miralax   Powd (Polyethylene Glycol 3350) .... Every Other Day 9)  Ketoconazole 2 %  Crea (Ketoconazole) .... Apply Three Times A Day As Needed 10)  Klonopin 0.5 Mg  Tabs (Clonazepam) .... One By Mouth One Dialysis Days 11)  Mimyx   Crea (Emollient) .... Apply To Legs At Bedtime 12)  Cymbalta 30 Mg Cpep (Duloxetine Hcl) .Marland Kitchen.. 1 Two Times A Day 13)  Celebrex 200 Mg  Caps (Celecoxib) .... One Tab Two Times A Day As Needed 14)  Renagel 800 Mg Tabs (Sevelamer Hcl) .... 3  Three Times A Day and 1-2 Extra With Snack 15)  Phoslo 667 Mg Caps (Calcium Acetate (Phos Binder)) .... 2 With Meals Three Times A Day 1-2 Extra With Snacks 16)  Analpram-Hc 1-2.5 % Crea (Hydrocortisone Ace-Pramoxine) .... Apply Per Rectum Two Times A Day 17)  Dulcolax 10 Mg Supp (Bisacodyl) .... One Per Rectum Every 8 Hours As Needed For Constipation 18)  Delsym 30 Mg/45ml Lqcr (Dextromethorphan Polistirex) .... As Directed 19)  Zofran 4 Mg Tabs (Ondansetron Hcl) .Marland Kitchen.. 1 Every 6-8 Hours As Needed Nausea and Vomiting  Allergies (verified): 1)  ! Medrol 2)  ! Cinda Quest  Past History:  Family History: Last updated: 01/07/2007 Family History Diabetes 1st degree relative Family  History Hypertension Family History Other cancer-breast  Social History: Last updated: 06/03/2009 Single lives is group home Never Smoked Passive Smoking: Dad from birth to age 62  Risk Factors: Smoking Status: never (09/06/2009) Passive Smoke Exposure: no (09/06/2009)  Past medical, surgical, family and social histories (including risk factors) reviewed, and no changes noted (except as noted below).  Past Medical History: Reviewed history from 06/03/2009 and no changes required. 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 Family History Diabetes 1st degree relative Family History Hypertension Family History Other cancer-breast  Social History: Reviewed history from 06/03/2009 and no changes required. Single lives is group home Never  Smoked Passive Smoking: Dad from birth to age 27  Review of Systems       The patient complains of weight gain.  The patient denies anorexia, fever, weight loss, vision loss, decreased hearing, hoarseness, chest pain, syncope, dyspnea on exertion, peripheral edema, prolonged cough, headaches, hemoptysis, abdominal pain, melena, hematochezia, severe indigestion/heartburn, hematuria, incontinence, genital sores, muscle weakness, suspicious skin lesions, transient blindness, difficulty walking, depression, unusual weight change, abnormal bleeding, enlarged lymph nodes, angioedema, and breast masses.    Physical Exam  General:  alert and overweight-appearing.   Head:  normocephalic.   Eyes:  pupils equal and pupils round.   Ears:  R ear normal and L ear normal.   Mouth:  pharynx pink and moist and no erythema.   Neck:  No deformities, masses, or tenderness noted. Lungs:  normal respiratory effort and no intercostal retractions.   Heart:  normal rate and regular rhythm.   Abdomen:  soft, distended, umbilical hernia, and incisional hernia.     Impression & Recommendations:  Problem # 1:  SEIZURE DISORDER (ICD-780.39) Assessment Unchanged no seizured Her updated medication list for this problem includes:    Mysoline Tabs (Primidone tabs) .Marland Kitchen... 250 +500 two times a day    Topamax 200 Mg Tabs (Topiramate) .Marland Kitchen..Marland Kitchen Two times a day    Klonopin 0.5 Mg Tabs (Clonazepam) ..... One by mouth one dialysis days  Problem # 2:  CONSTIPATION, RECURRENT (ICD-564.09) moving her bowels and has not had a flair of her hemorrhoids Her updated medication list for this problem includes:    Miralax Powd (Polyethylene glycol 3350) ..... Every other day    Dulcolax 10 Mg Supp (Bisacodyl) ..... One per rectum every 8 hours as needed for constipation  Problem # 3:  GERD (ICD-530.81) stable Her updated medication list for this problem includes:    Nexium 40 Mg Cpdr (Esomeprazole magnesium) ..... One  daily  Problem # 4:  ACCIDENTAL FALL FROM BED (ICD-E884.4) reviewed the ER records and the pt today has mild pain in her foot from fall no fracture and no neuro changes  Problem # 5:  ASTHMA (ICD-493.90)  Her updated medication list for this problem includes:    Advair Diskus 250-50 Mcg/dose Misc (Fluticasone-salmeterol) .Marland Kitchen..Marland Kitchen Two times a day  Pulmonary Functions Reviewed: O2 sat: 97 (06/03/2009)  Complete Medication List: 1)  Mysoline Tabs (Primidone tabs) .... 250 +500 two times a day 2)  Nexium 40 Mg Cpdr (  Esomeprazole magnesium) .... One daily 3)  Advair Diskus 250-50 Mcg/dose Misc (Fluticasone-salmeterol) .... Two times a day 4)  Nasonex Susp (Mometasone furoate susp) .... Once daily 5)  Topamax 200 Mg Tabs (Topiramate) .... Two times a day 6)  Rena-vite Tabs (B complex-c-folic acid) .... Once daily 7)  Niferex-150 Forte 50-100 Mg Caps (Fe-succ ac-c-thre ac-b12-fa) .... Alternates with ferri-seque 1 once daily 8)  Miralax Powd (Polyethylene glycol 3350) .... Every other day 9)  Ketoconazole 2 % Crea (Ketoconazole) .... Apply three times a day as needed 10)  Klonopin 0.5 Mg Tabs (Clonazepam) .... One by mouth one dialysis days 11)  Mimyx Crea (Emollient) .... Apply to legs at bedtime 12)  Cymbalta 30 Mg Cpep (Duloxetine hcl) .Marland Kitchen.. 1 two times a day 13)  Celebrex 200 Mg Caps (Celecoxib) .... One tab two times a day as needed 14)  Renagel 800 Mg Tabs (Sevelamer hcl) .... 3 three times a day and 1-2 extra with snack 15)  Phoslo 667 Mg Caps (Calcium acetate (phos binder)) .... 2 with meals three times a day 1-2 extra with snacks 16)  Analpram-hc 1-2.5 % Crea (Hydrocortisone ace-pramoxine) .... Apply per rectum two times a day 17)  Dulcolax 10 Mg Supp (Bisacodyl) .... One per rectum every 8 hours as needed for constipation 18)  Delsym 30 Mg/43ml Lqcr (Dextromethorphan polistirex) .... As directed 19)  Zofran 4 Mg Tabs (Ondansetron hcl) .Marland Kitchen.. 1 every 6-8 hours as needed nausea and  vomiting  Patient Instructions: 1)  wrap ankle daily for 2 weeks

## 2010-08-15 NOTE — Miscellaneous (Signed)
Summary: Orders Update pft charges  Clinical Lists Changes  Orders: Added new Service order of Carbon Monoxide diffusing w/capacity (94720) - Signed Added new Service order of Lung Volumes (94240) - Signed Added new Service order of Spirometry (Pre & Post) (94060) - Signed 

## 2010-08-15 NOTE — Miscellaneous (Signed)
Summary: Physician's Orders/[facility]  Physician's Orders/[facility]   Imported By: Maryln Gottron 11/21/2009 14:17:27  _____________________________________________________________________  External Attachment:    Type:   Image     Comment:   External Document

## 2010-08-15 NOTE — Progress Notes (Signed)
Summary: PT   Phone Note From Other Clinic   Caller: erin [phone] [facility] Summary of Call: 2x week home safety & strengthening & fall preventions.  Eval today.  Hospitalized 5-21 -11 for fall.  Verify Dr. Shela Commons will be following her& orders Initial call taken by: Rudy Jew, RN,  Dec 13, 2009 3:22 PM  Follow-up for Phone Call        will follow Follow-up by: Stacie Glaze MD,  December 14, 2009 8:41 AM  Additional Follow-up for Phone Call Additional follow up Details #1::        [facility] notified. Additional Follow-up by: Lynann Beaver CMA,  December 14, 2009 9:06 AM

## 2010-08-15 NOTE — Miscellaneous (Signed)
Summary: Physician's Orders/[facility]  Physician's Orders/[facility]   Imported By: Maryln Gottron 02/23/2010 15:19:37  _____________________________________________________________________  External Attachment:    Type:   Image     Comment:   External Document

## 2010-08-15 NOTE — Progress Notes (Signed)
Summary: appt?  Phone Note Call from Patient   Caller: Patient Call For: Stacie Glaze MD Reason for Call: Acute Illness Summary of Call: Pt wants an appt because of cough and congestion.  Nurse says she is not hearing any congestion or cough.  Do you want to see her? [phone] Initial call taken by: Lynann Beaver CMA,  July 28, 2009 10:15 AM  Follow-up for Phone Call        if no congestion- she doesnt need to come-- we have no openings -she has appointment in march Follow-up by: Willy Eddy, LPN,  July 28, 2009 10:46 AM  Additional Follow-up for Phone Call Additional follow up Details #1::        Nurse notified. Additional Follow-up by: Lynann Beaver CMA,  July 28, 2009 10:56 AM

## 2010-08-15 NOTE — Letter (Signed)
Summary: [facility]  [facility]   Imported By: Maryln Gottron 02/13/2010 10:08:12  _____________________________________________________________________  External Attachment:    Type:   Image     Comment:   External Document

## 2010-08-15 NOTE — Miscellaneous (Signed)
Summary: Physician's Orders/[facility]  Physician's Orders/[facility]   Imported By: Maryln Gottron 12/21/2009 11:16:56  _____________________________________________________________________  External Attachment:    Type:   Image     Comment:   External Document

## 2010-08-15 NOTE — Miscellaneous (Signed)
Summary: Physician's Orders/[facility]  Physician's Orders/[facility]   Imported By: Maryln Gottron 04/11/2010 10:04:11  _____________________________________________________________________  External Attachment:    Type:   Image     Comment:   External Document

## 2010-08-15 NOTE — Assessment & Plan Note (Signed)
Summary: fu per pt/njr pt rsc due to hosp/njr   Vital Signs:  Patient profile:   55 year old female Height:      67 inches Weight:      306 pounds BMI:     48.10 Temp:     98.2 degrees F oral Pulse rate:   76 / minute Resp:     14 per minute BP sitting:   140 / 80  (left arm) Cuff size:   large  Vitals Entered By: Willy Eddy, LPN (January 24, 2010 2:15 PM) CC: roa   Primary Care Provider:  Dr. Darryll Capers  CC:  roa.  History of Present Illness: routine follow up asthma, GERD and depression has renal failure and is on dialysis pt was seen in the ER for a seizure sunday she was found to have a high potassium she was also found to have an early right lower lung infiltrate? vs atelectasis her white count was not elevated she had not symptoms there was concern ( i assume) over possible aspiration when she had a sz. ( she insists that she did not vomit with her seizure.  Note the diagnosis of tuberous sclerosis.  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) 13)  Nevus, Atypical  (ICD-216.9) 14)  Allergic Rhinitis, Chronic  (ICD-477.9) 15)  Contact Dermatitis&other Eczema Due Detergents  (ICD-692.0) 16)  Tinea Corporis  (ICD-110.5) 17)  Gerd  (ICD-530.81) 18)  Cellulitis and Abscess of Upper Arm and Forearm  (ICD-682.3) 19)  Constipation, Slow Transit  (ICD-564.01) 20)  Seizure Disorder   (ICD-780.39) 21)  Renal Disease, End Stage  (ICD-585.6) 22)  Asthma  (ICD-493.90) 23)  Family History Diabetes 1st Degree Relative  (ICD-V18.0) 24)  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) 25)  Mental Retardation  (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) 26)  Epilepsy, 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) 27)  Obesity, Morbid  (ICD-278.01)  Current Problems (verified): 1)  Lung Nodule  (ICD-518.89) 2)  Accidental Fall From Bed  (ICD-E884.4) 3)  Shortness of Breath (SOB)  (ICD-786.05) 4)  Depression  (ICD-311) 5)  Acute Bronchitis  (ICD-466.0) 6)  Constipation, Recurrent  (ICD-564.09) 7)  External Thrombosed Hemorrhoids  (ICD-455.4) 8)  Sleep Apnea, Obstructive  (ICD-327.23) 9)  Depression, Chronic, Severe  (ICD-296.33) 10)  Skin Tag  (ICD-701.9) 11)  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) 12)  Nevus, Atypical  (ICD-216.9) 13)  Allergic Rhinitis, Chronic  (ICD-477.9) 14)  Contact Dermatitis&other Eczema Due Detergents  (ICD-692.0) 15)  Tinea Corporis  (ICD-110.5) 16)  Gerd  (ICD-530.81) 17)  Cellulitis and Abscess of Upper Arm and Forearm  (ICD-682.3) 18)  Constipation, Slow Transit  (ICD-564.01) 19)  Seizure Disorder  (ICD-780.39) 20)  Renal Disease, End Stage  (ICD-585.6) 21)  Asthma  (ICD-493.90) 22)  Family History Diabetes 1st Degree Relative  (ICD-V18.0) 23)  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 250 +500 Two Times A Day 2)  Nexium 40 Mg  Cpdr (Esomeprazole Magnesium) .... One Daily 3)  Advair Diskus 250-50 Mcg/dose  Misc (Fluticasone-Salmeterol) .... Two Times A Day 4)  Nasonex   Susp (Mometasone Furoate Susp) .... Once Daily 5)  Topamax 200 Mg  Tabs (Topiramate) .... Two Times A Day 6)  Rena-Vite   Tabs (B Complex-C-Folic Acid) .... Once Daily 7)  Niferex-150 Forte 50-100 Mg   Caps (Fe-Succ Ac-C-Thre Ac-B12-Fa) .... Alternates With Ferri-Seque 1 Once Daily 8)  Miralax   Powd (Polyethylene Glycol 3350) .... Every Other Day 9)  Ketoconazole 2 %  Crea (Ketoconazole) .... Apply Three Times A Day As Needed 10)  Klonopin 0.5 Mg  Tabs (Clonazepam) .... One By Mouth One Dialysis Days 11)  Mimyx   Crea (Emollient) .... Apply To Legs At Bedtime 12)  Cymbalta 30 Mg Cpep (Duloxetine Hcl) .Marland Kitchen.. 1 Two Times A Day 13)  Celebrex 200 Mg  Caps (Celecoxib) .... One Tab Two Times A Day As Needed 14)  Renagel 800 Mg Tabs (Sevelamer Hcl) .... 3 Three Times A Day and 1-2 Extra With Snack 15)  Phoslo 667 Mg Caps (Calcium Acetate (Phos Binder)) .... 2 With Meals Three Times A Day 1-2 Extra With Snacks 16)  Analpram-Hc 1-2.5 % Crea (Hydrocortisone Ace-Pramoxine) .... Apply Per Rectum Two Times A Day 17)  Dulcolax 10 Mg Supp (Bisacodyl) .... One Per Rectum Every 8 Hours As Needed For Constipation 18)  Delsym 30 Mg/27ml Lqcr (Dextromethorphan Polistirex) .... As Directed 19)  Zofran 4 Mg Tabs (Ondansetron Hcl) .Marland Kitchen.. 1 Every 6-8 Hours As Needed Nausea and Vomiting 20)  Allegra 180 Mg Tabs (Fexofenadine Hcl) .Marland Kitchen.. 1 Once Daily  Current Medications (verified): 1)  Mysoline   Tabs (Primidone Tabs) .... 250 +500 Two Times A Day 2)  Nexium 40 Mg  Cpdr (Esomeprazole Magnesium) .Marland Kitchen.. 1 Two Times A Day 3)  Advair Diskus 250-50 Mcg/dose  Misc (Fluticasone-Salmeterol) .... Two Times A Day 4)  Nasonex   Susp (Mometasone Furoate Susp) .... Once Daily 5)  Topamax 200 Mg  Tabs (Topiramate) .... Two Times A Day 6)  Rena-Vite   Tabs (B Complex-C-Folic Acid) .... Once Daily 7)  Niferex-150 Forte 50-100 Mg  Caps (Fe-Succ Ac-C-Thre Ac-B12-Fa) .... Alternates With Ferri-Seque 1 Once Daily 8)  Miralax   Powd (Polyethylene Glycol 3350) .... Every Other Day 9)  Ketoconazole 2 %  Crea (Ketoconazole) .... Apply Three Times A Day As Needed 10)  Klonopin 0.5 Mg  Tabs (Clonazepam) .... One By Mouth One Dialysis Days 11)   Mimyx   Crea (Emollient) .... Apply To Legs At Bedtime 12)  Cymbalta 30 Mg Cpep (Duloxetine Hcl) .Marland Kitchen.. 1 Two Times A Day 13)  Celebrex 200 Mg  Caps (Celecoxib) .... One Tab Two Times A Day As Needed 14)  Renagel 800 Mg Tabs (Sevelamer Hcl) .... 3 Three Times A Day and 1-2 Extra With Snack 15)  Phoslo 667 Mg Caps (Calcium Acetate (Phos Binder)) .... 2 With Meals Three Times A Day 1-2 Extra With Snacks 16)  Analpram-Hc 1-2.5 % Crea (Hydrocortisone Ace-Pramoxine) .... Apply Per Rectum Two Times A Day 17)  Dulcolax 10 Mg Supp (Bisacodyl) .... One Per Rectum Every 8 Hours As Needed For Constipation 18)  Zofran 4 Mg Tabs (Ondansetron Hcl) .Marland Kitchen.. 1 Every 6-8 Hours As Needed Nausea and Vomiting  19)  Midodrine Hcl 10 Mg Tabs (Midodrine Hcl) .Marland Kitchen.. 1 Three Times A Day 20)  Fexofenadine Hcl 180 Mg Tabs (Fexofenadine Hcl) .... One By Mouth Daily Prn  Allergies (verified): 1)  ! Medrol 2)  ! Cinda Quest  Past History:  Family History: Last updated: 01/07/2007 Family History Diabetes 1st degree relative Family History Hypertension Family History Other cancer-breast  Social History: Last updated: 06/03/2009 Single lives is group home Never Smoked Passive Smoking: Dad from birth to age 26  Risk Factors: Smoking Status: never (01/24/2010) Passive Smoke Exposure: no (01/24/2010)  Past medical, surgical, family and social histories (including risk factors) reviewed, and no changes noted (except as noted below).  Past Medical History: Reviewed history from 06/03/2009 and no changes required. 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 Family History Diabetes 1st degree relative Family History Hypertension Family History Other cancer-breast  Social History: Reviewed history from 06/03/2009 and no changes required. Single lives is group home Never Smoked Passive Smoking: Dad from birth to age 15  Review of Systems       The patient complains of weight gain.  The patient denies anorexia, fever, weight loss, vision loss, decreased hearing, hoarseness, chest pain, syncope, dyspnea on exertion, peripheral edema, prolonged cough, headaches, hemoptysis, abdominal pain, melena, hematochezia, severe indigestion/heartburn, hematuria, incontinence, genital sores, muscle weakness, suspicious skin lesions, transient blindness, difficulty walking, depression, unusual weight change, abnormal bleeding, enlarged lymph nodes, angioedema, and breast masses.    Physical Exam  General:  alert and overweight-appearing.   Head:  normocephalic.   Eyes:  pupils equal and pupils round.   Ears:  R ear normal and L ear normal.   Mouth:  pharynx pink and moist and no erythema.   Neck:  No deformities, masses, or tenderness noted. Lungs:  normal respiratory effort and no intercostal retractions.   Heart:  normal rate and regular rhythm.   Abdomen:  soft, distended, umbilical hernia, and incisional hernia.   Neurologic:  alert & oriented X3 and finger-to-nose normal.   Psych:  Oriented X3 and not anxious appearing.     Impression & Recommendations:  Problem # 1:  CONSTIPATION, RECURRENT (ICD-564.09)  Her updated medication list for this problem includes:    Miralax Powd (Polyethylene glycol 3350) ..... Every other day    Dulcolax 10 Mg Supp (Bisacodyl) ..... One per rectum every 8 hours as needed for constipation  Problem # 2:  SEIZURE DISORDER (ICD-780.39) this was noted in the context of hyperkalemia she has tuberous  sclerosis she has not had symptoms since this Her updated medication list for this problem includes:    Mysoline Tabs (Primidone tabs) .Marland Kitchen... 250 +500 two times a day    Topamax 200 Mg Tabs (Topiramate) .Marland Kitchen..Marland Kitchen Two times a day    Klonopin 0.5 Mg Tabs (Clonazepam) ..... One by mouth one dialysis days  Problem # 3:  ASTHMA (ICD-493.90) Assessment: Unchanged stable Her updated medication list for this problem includes:    Advair Diskus 250-50 Mcg/dose Misc (Fluticasone-salmeterol) .Marland Kitchen..Marland Kitchen Two times a day  Problem # 4:  HYPERKALEMIA (  ICD-276.7) Assessment: Unchanged  monitering potassium today  Orders: Venipuncture (16109) TLB-BMP (Basic Metabolic Panel-BMET) (80048-METABOL)  Complete Medication List: 1)  Mysoline Tabs (Primidone tabs) .... 250 +500 two times a day 2)  Nexium 40 Mg Cpdr (Esomeprazole magnesium) .Marland Kitchen.. 1 two times a day 3)  Advair Diskus 250-50 Mcg/dose Misc (Fluticasone-salmeterol) .... Two times a day 4)  Nasonex Susp (Mometasone furoate susp) .... Once daily 5)  Topamax 200 Mg Tabs (Topiramate) .... Two times a day 6)  Rena-vite Tabs (B complex-c-folic acid) .... Once daily 7)  Niferex-150 Forte 50-100 Mg Caps (Fe-succ ac-c-thre ac-b12-fa) .... Alternates with ferri-seque 1 once daily 8)  Miralax Powd (Polyethylene glycol 3350) .... Every other day 9)  Ketoconazole 2 % Crea (Ketoconazole) .... Apply three times a day as needed 10)  Klonopin 0.5 Mg Tabs (Clonazepam) .... One by mouth one dialysis days 11)  Mimyx Crea (Emollient) .... Apply to legs at bedtime 12)  Cymbalta 30 Mg Cpep (Duloxetine hcl) .Marland Kitchen.. 1 two times a day 13)  Celebrex 200 Mg Caps (Celecoxib) .... One tab two times a day as needed 14)  Renagel 800 Mg Tabs (Sevelamer hcl) .... 3 three times a day and 1-2 extra with snack 15)  Phoslo 667 Mg Caps (Calcium acetate (phos binder)) .... 2 with meals three times a day 1-2 extra with snacks 16)  Analpram-hc 1-2.5 % Crea (Hydrocortisone ace-pramoxine) .... Apply  per rectum two times a day 17)  Dulcolax 10 Mg Supp (Bisacodyl) .... One per rectum every 8 hours as needed for constipation 18)  Zofran 4 Mg Tabs (Ondansetron hcl) .Marland Kitchen.. 1 every 6-8 hours as needed nausea and vomiting 19)  Midodrine Hcl 10 Mg Tabs (Midodrine hcl) .Marland Kitchen.. 1 three times a day 20)  Fexofenadine Hcl 180 Mg Tabs (Fexofenadine hcl) .... One by mouth daily prn  Patient Instructions: 1)  Please schedule a follow-up appointment in 3 months. 2)  STOP THE AVELOX 3)  WE MONITERED POTASSIUM TODAY 4)  SHE IS ASKING FOR REFILL ON ALLEGRA Prescriptions: FEXOFENADINE HCL 180 MG TABS (FEXOFENADINE HCL) ONE by mouth DAILY PRN  #30 x 3   Entered and Authorized by:   Stacie Glaze MD   Signed by:   Stacie Glaze MD on 01/24/2010   Method used:   Electronically to        [facility]* (retail)       [address]       [place], [place]  [address]       Ph: [phone]       Fax: [phone]   RxID:   [id number]

## 2010-08-15 NOTE — Miscellaneous (Signed)
Summary: Physician's Orders/[facility]  Physician's Orders/[facility]   Imported By: Maryln Gottron 05/09/2010 15:58:13  _____________________________________________________________________  External Attachment:    Type:   Image     Comment:   External Document

## 2010-08-17 ENCOUNTER — Inpatient Hospital Stay (HOSPITAL_COMMUNITY): Payer: Medicare Other

## 2010-08-17 ENCOUNTER — Emergency Department (HOSPITAL_COMMUNITY): Payer: Medicare Other

## 2010-08-17 ENCOUNTER — Inpatient Hospital Stay (HOSPITAL_COMMUNITY)
Admission: EM | Admit: 2010-08-17 | Discharge: 2010-08-22 | DRG: 628 | Disposition: A | Discharge status: Nursing Home (Includes ICF) | Payer: Medicare Other | Attending: Nephrology | Admitting: Nephrology

## 2010-08-17 ENCOUNTER — Encounter (HOSPITAL_COMMUNITY): Payer: Self-pay | Admitting: Radiology

## 2010-08-17 DIAGNOSIS — F411 Generalized anxiety disorder: Secondary | ICD-10-CM | POA: Diagnosis present

## 2010-08-17 DIAGNOSIS — I498 Other specified cardiac arrhythmias: Secondary | ICD-10-CM | POA: Diagnosis present

## 2010-08-17 DIAGNOSIS — E875 Hyperkalemia: Principal | ICD-10-CM | POA: Diagnosis present

## 2010-08-17 DIAGNOSIS — J449 Chronic obstructive pulmonary disease, unspecified: Secondary | ICD-10-CM | POA: Diagnosis present

## 2010-08-17 DIAGNOSIS — I959 Hypotension, unspecified: Secondary | ICD-10-CM | POA: Diagnosis present

## 2010-08-17 DIAGNOSIS — N2581 Secondary hyperparathyroidism of renal origin: Secondary | ICD-10-CM | POA: Diagnosis present

## 2010-08-17 DIAGNOSIS — G40909 Epilepsy, unspecified, not intractable, without status epilepticus: Secondary | ICD-10-CM | POA: Diagnosis present

## 2010-08-17 DIAGNOSIS — G4733 Obstructive sleep apnea (adult) (pediatric): Secondary | ICD-10-CM | POA: Diagnosis present

## 2010-08-17 DIAGNOSIS — T82898A Other specified complication of vascular prosthetic devices, implants and grafts, initial encounter: Secondary | ICD-10-CM | POA: Diagnosis present

## 2010-08-17 DIAGNOSIS — J4489 Other specified chronic obstructive pulmonary disease: Secondary | ICD-10-CM | POA: Diagnosis present

## 2010-08-17 DIAGNOSIS — D638 Anemia in other chronic diseases classified elsewhere: Secondary | ICD-10-CM | POA: Diagnosis present

## 2010-08-17 DIAGNOSIS — N186 End stage renal disease: Secondary | ICD-10-CM | POA: Diagnosis present

## 2010-08-17 HISTORY — DX: Sleep apnea, unspecified: G47.30

## 2010-08-17 HISTORY — DX: Unspecified intellectual disabilities: F79

## 2010-08-17 HISTORY — DX: Disorder of kidney and ureter, unspecified: N28.9

## 2010-08-17 HISTORY — DX: Unspecified osteoarthritis, unspecified site: M19.90

## 2010-08-17 HISTORY — DX: Dependence on renal dialysis: Z99.2

## 2010-08-17 HISTORY — DX: Dependence on renal dialysis: N18.6

## 2010-08-17 LAB — POCT I-STAT, CHEM 8
Calcium, Ion: 1.12 mmol/L (ref 1.12–1.32)
HCT: 41 % (ref 36.0–46.0)
TCO2: 27 mmol/L (ref 0–100)

## 2010-08-17 LAB — CBC
MCH: 35.4 pg — ABNORMAL HIGH (ref 26.0–34.0)
MCV: 111.5 fL — ABNORMAL HIGH (ref 78.0–100.0)
Platelets: ADEQUATE 10*3/uL (ref 150–400)
RBC: 3.47 MIL/uL — ABNORMAL LOW (ref 3.87–5.11)
RDW: 17.6 % — ABNORMAL HIGH (ref 11.5–15.5)
WBC: 11.9 10*3/uL — ABNORMAL HIGH (ref 4.0–10.5)

## 2010-08-17 LAB — DIFFERENTIAL
Basophils Absolute: 0 10*3/uL (ref 0.0–0.1)
Basophils Relative: 0 % (ref 0–1)
Eosinophils Absolute: 0.1 10*3/uL (ref 0.0–0.7)
Lymphocytes Relative: 24 % (ref 12–46)
Monocytes Absolute: 0.4 10*3/uL (ref 0.1–1.0)

## 2010-08-17 LAB — COMPREHENSIVE METABOLIC PANEL
AST: 34 U/L (ref 0–37)
BUN: 60 mg/dL — ABNORMAL HIGH (ref 6–23)
CO2: 24 mEq/L (ref 19–32)
Calcium: 9.9 mg/dL (ref 8.4–10.5)
Creatinine, Ser: 8.2 mg/dL — ABNORMAL HIGH (ref 0.4–1.2)
GFR calc Af Amer: 6 mL/min — ABNORMAL LOW (ref >60)
GFR calc non Af Amer: 5 mL/min — ABNORMAL LOW (ref >60)

## 2010-08-17 LAB — POCT CARDIAC MARKERS: Troponin i, poc: <0.05 ng/mL (ref 0.00–0.09)

## 2010-08-17 LAB — GLUCOSE, CAPILLARY: Glucose-Capillary: 223 mg/dL — ABNORMAL HIGH (ref 70–99)

## 2010-08-17 NOTE — Assessment & Plan Note (Signed)
Summary: fup/cjr   Vital Signs:  Patient profile:   55 year old female Height:      67 inches Weight:      304 pounds BMI:     47.79 Temp:     98.2 degrees F oral Pulse rate:   80 / minute Resp:     16 per minute BP sitting:   110 / 70  (left arm) Cuff size:   large  Vitals Entered By: Willy Eddy, LPN (July 21, 2010 2:01 PM) CC: roa Is Patient Diabetic? No   Primary Care Provider:  Dr. Darryll Capers  CC:  roa.  History of Present Illness: the pt presents for follow up she has beendoing better and has been able to loose weigth by cutting out soft drinks The pts heart burn has been stable on the nexium... but she feels that the pahrmacy does not get her medications on time. 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 ( shunt)    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) 13)  Nevus, Atypical  (ICD-216.9) 14)  Allergic Rhinitis, Chronic  (ICD-477.9) 15)  Contact Dermatitis&other Eczema Due Detergents  (ICD-692.0) 16)  Tinea Corporis  (ICD-110.5) 17)  Gerd  (ICD-530.81) 18)  Cellulitis and Abscess of Upper Arm and Forearm  (ICD-682.3) 19)  Constipation, Slow Transit  (ICD-564.01) 20)  Seizure Disorder  (ICD-780.39) 21)  Renal Disease, End Stage  (ICD-585.6) 22)  Asthma  (ICD-493.90) 23)  Family History Diabetes 1st Degree  Relative  (ICD-V18.0) 24)  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) 25)  Mental Retardation  (ICD-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) 26)  Epilepsy, 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) 27)  Obesity, Morbid  (ICD-278.01)  Current Problems (verified): 1)  Hyperkalemia  (ICD-276.7) 2)  Lung Nodule  (ICD-518.89) 3)  Accidental Fall From Bed  (ICD-E884.4) 4)  Shortness of Breath (SOB)  (ICD-786.05) 5)  Depression  (ICD-311) 6)  Acute Bronchitis  (ICD-466.0) 7)  Constipation, Recurrent  (ICD-564.09) 8)  External Thrombosed Hemorrhoids  (ICD-455.4) 9)  Sleep Apnea, Obstructive  (ICD-327.23) 10)  Depression, Chronic, Severe  (ICD-296.33) 11)  Skin Tag  (ICD-701.9) 12)  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) 13)  Nevus, Atypical  (ICD-216.9) 14)  Allergic Rhinitis, Chronic  (ICD-477.9) 15)  Contact Dermatitis&other Eczema Due Detergents  (ICD-692.0) 16)  Tinea Corporis  (ICD-110.5) 17)  Gerd  (ICD-530.81) 18)  Cellulitis and Abscess of Upper Arm and Forearm  (ICD-682.3) 19)  Constipation, Slow Transit  (ICD-564.01) 20)  Seizure Disorder  (ICD-780.39) 21)  Renal Disease, End Stage  (ICD-585.6) 22)  Asthma  (ICD-493.90) 23)  Family History Diabetes 1st Degree Relative  (ICD-V18.0) 24)  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 250 +500 Two Times A Day 2)  Nexium 40 Mg  Cpdr (Esomeprazole Magnesium) .Marland Kitchen.. 1 Two Times A Day 3)  Advair Diskus 250-50 Mcg/dose  Misc (Fluticasone-Salmeterol) .... Two Times A Day 4)  Nasonex   Susp (Mometasone Furoate Susp) .... Once Daily 5)  Topamax 200 Mg  Tabs (Topiramate) .... Two Times A Day 6)  Rena-Vite   Tabs (B Complex-C-Folic Acid) .... Once Daily 7)  Niferex-150 Forte 50-100 Mg  Caps (Fe-Succ Ac-C-Thre Ac-B12-Fa) .... Alternates With Ferri-Seque 1 Once Daily 8)   Miralax   Powd (Polyethylene Glycol 3350) .... Every Other Day 9)  Ketoconazole 2 %  Crea (Ketoconazole) .... Apply Three Times A Day As Needed 10)  Klonopin 0.5 Mg  Tabs (Clonazepam) .... One By Mouth One Dialysis Days 11)  Mimyx   Crea (Emollient) .... Apply To Legs At Bedtime 12)  Cymbalta 30 Mg Cpep (Duloxetine Hcl) .Marland Kitchen.. 1 Two Times A Day 13)  Celebrex 200 Mg  Caps (Celecoxib) .... One Tab Two Times A Day As Needed 14)  Renagel 800 Mg Tabs (Sevelamer Hcl) .... 3 Three Times A Day and 1-2 Extra With Snack 15)  Phoslo 667 Mg Caps (Calcium Acetate (Phos Binder)) .... 2 With Meals Three Times A Day 1-2 Extra With Snacks 16)  Analpram-Hc 1-2.5 % Crea (Hydrocortisone Ace-Pramoxine) .... Apply Per Rectum Two Times A Day 17)  Dulcolax 10 Mg Supp (Bisacodyl) .... One Per Rectum Every 8 Hours As Needed For Constipation 18)  Zofran 4 Mg Tabs (Ondansetron Hcl) .Marland Kitchen.. 1 Every 6-8 Hours As Needed Nausea and Vomiting 19)  Midodrine Hcl 10 Mg Tabs (Midodrine Hcl) .Marland Kitchen.. 1 Three Times A Day 20)  Fexofenadine Hcl 180 Mg Tabs (Fexofenadine Hcl) .... One By Mouth Daily Prn 21)  Bactroban 2 % Oint (Mupirocin) .... Apply To Affected Area Buttocks Two Times A Day.  Current Medications (verified): 1)  Mysoline   Tabs (Primidone Tabs) .... 250 +500 Two Times A Day 2)  Nexium 40 Mg  Cpdr (Esomeprazole Magnesium) .Marland Kitchen.. 1 Two Times A Day 3)  Advair Diskus 250-50 Mcg/dose  Misc (Fluticasone-Salmeterol) .... Two Times A Day 4)  Nasonex   Susp (Mometasone Furoate Susp) .... Once Daily 5)  Topamax 200 Mg  Tabs (Topiramate) .... Two Times A Day 6)  Rena-Vite   Tabs (B Complex-C-Folic Acid) .... Once Daily 7)  Niferex-150 Forte 50-100 Mg  Caps (Fe-Succ Ac-C-Thre Ac-B12-Fa) .... Alternates With Ferri-Seque 1 Once Daily 8)  Miralax   Powd (Polyethylene Glycol 3350) .... Every Other Day 9)  Ketoconazole 2 %  Crea (Ketoconazole) .... Apply Three Times A Day As Needed 10)  Klonopin 0.5 Mg  Tabs (Clonazepam) .... One By Mouth One  Dialysis Days 11)  Mimyx   Crea (Emollient) .... Apply To Legs At Bedtime 12)  Cymbalta 30 Mg Cpep (Duloxetine Hcl) .Marland Kitchen.. 1 Two Times A Day 13)  Celebrex 200 Mg  Caps (Celecoxib) .... One Tab Two Times A Day As Needed 14)  Renagel 800 Mg Tabs (Sevelamer Hcl) .... 3 Three Times A Day and 1-2 Extra With Snack 15)  Phoslo 667 Mg Caps (Calcium Acetate (Phos Binder)) .... 2 With Meals Three Times A Day 1-2 Extra With Snacks 16)  Analpram-Hc 1-2.5 % Crea (Hydrocortisone Ace-Pramoxine) .... Apply Per Rectum Two Times A Day 17)  Dulcolax 10 Mg Supp (Bisacodyl) .... One Per Rectum Every 8 Hours As Needed For Constipation 18)  Zofran 4 Mg Tabs (Ondansetron  Hcl) .... 1 Every 6-8 Hours As Needed Nausea and Vomiting 19)  Midodrine Hcl 10 Mg Tabs (Midodrine Hcl) .Marland Kitchen.. 1 Three Times A Day 20)  Fexofenadine Hcl 180 Mg Tabs (Fexofenadine Hcl) .... One By Mouth Daily Prn 21)  Bactroban 2 % Oint (Mupirocin) .... Apply To Affected Area Buttocks Two Times A Day.  Allergies (verified): 1)  ! Medrol 2)  ! Cinda Quest  Past History:  Family History: Last updated: 01/07/2007 Family History Diabetes 1st degree relative Family History Hypertension Family History Other cancer-breast  Social History: Last updated: 06/03/2009 Single lives is group home Never Smoked Passive Smoking: Dad from birth to age 63  Risk Factors: Smoking Status: never (07/21/2010) Passive Smoke Exposure: no (07/21/2010)  Past medical, surgical, family and social histories (including risk factors) reviewed, and no changes noted (except as noted below).  Past Medical History: Reviewed history from 06/03/2009 and no changes required. Asthma Tuberous sclerosis Obesity Seizure disorder renal failure, on dialysis GERD Depression mental retardation Sleep apnea hx  > on CPAP Sepsis admit March 2010 - signed out AMA Bronchitis admit Feb 2010 MSSA infection of Graft 2009 and septic shock Hx of prolonged QT - while on  seroquel  Past Surgical History: Reviewed history from 07/14/2007 and no changes required. 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 Family History Diabetes 1st degree relative Family History Hypertension Family History Other cancer-breast  Social History: Reviewed history from 06/03/2009 and no changes required. Single lives is group home Never Smoked Passive Smoking: Dad from birth to age 61  Review of Systems       The patient complains of dyspnea on exertion and peripheral edema.  The patient denies anorexia, fever, weight loss, weight gain, vision loss, decreased hearing, hoarseness, chest pain, syncope, prolonged cough, headaches, hemoptysis, abdominal pain, melena, hematochezia, severe indigestion/heartburn, hematuria, incontinence, genital sores, muscle weakness, suspicious skin lesions, transient blindness, difficulty walking, depression, unusual weight change, abnormal bleeding, enlarged lymph nodes, angioedema, and breast masses.    Physical Exam  General:  alert and overweight-appearing.   Head:  normocephalic.   Eyes:  pupils equal and pupils round.   Ears:  R ear normal and L ear normal.   Mouth:  pharynx pink and moist and no erythema.   Neck:  No deformities, masses, or tenderness noted. Lungs:  normal respiratory effort and no intercostal retractions.   Heart:  normal rate and regular rhythm.   Abdomen:  soft, distended, umbilical hernia, and incisional hernia.   Msk:  no joint swelling and no joint warmth.   Neurologic:  alert & oriented X3 and finger-to-nose normal.     Impression & Recommendations:  Problem # 1:  SHORTNESS OF BREATH (SOB) (ICD-786.05) Assessment Improved  Problem # 2:  CONSTIPATION, RECURRENT (ICD-564.09) Assessment: Improved  Her updated medication list for this problem includes:    Miralax Powd (Polyethylene glycol 3350) ..... Every other day     Dulcolax 10 Mg Supp (Bisacodyl) ..... One per rectum every 8 hours as needed for constipation  Discussed dietary fiber measures and increased water intake.   Problem # 3:  SLEEP APNEA, OBSTRUCTIVE (ICD-327.23) stable  Problem # 4:  GERD (ICD-530.81) Assessment: Unchanged  Her updated medication list for this problem includes:    Nexium 40 Mg Cpdr (Esomeprazole magnesium) .Marland Kitchen... 1 two times a day  Problem # 5:  RENAL DISEASE, END STAGE (ICD-585.6)  Labs Reviewed: BUN: 57 (01/24/2010)  Cr: 7.3 (01/24/2010)    Ca++: 9.9 (01/24/2010)     Problem # 6:  ASTHMA (ICD-493.90) Assessment: Unchanged  Her updated medication list for this problem includes:    Advair Diskus 250-50 Mcg/dose Misc (Fluticasone-salmeterol) .Marland Kitchen..Marland Kitchen Two times a day  Complete Medication List: 1)  Mysoline Tabs (Primidone tabs) .... 250 +500 two times a day 2)  Nexium 40 Mg Cpdr (Esomeprazole magnesium) .Marland Kitchen.. 1 two times a day 3)  Advair Diskus 250-50 Mcg/dose Misc (Fluticasone-salmeterol) .... Two times a day 4)  Nasonex Susp (Mometasone furoate susp) .... Once daily 5)  Topamax 200 Mg Tabs (Topiramate) .... Two times a day 6)  Rena-vite Tabs (B complex-c-folic acid) .... Once daily 7)  Niferex-150 Forte 50-100 Mg Caps (Fe-succ ac-c-thre ac-b12-fa) .... Alternates with ferri-seque 1 once daily 8)  Miralax Powd (Polyethylene glycol 3350) .... Every other day 9)  Ketoconazole 2 % Crea (Ketoconazole) .... Apply three times a day as needed 10)  Klonopin 0.5 Mg Tabs (Clonazepam) .... One by mouth one dialysis days 11)  Mimyx Crea (Emollient) .... Apply to legs at bedtime 12)  Cymbalta 30 Mg Cpep (Duloxetine hcl) .Marland Kitchen.. 1 two times a day 13)  Celebrex 200 Mg Caps (Celecoxib) .... One tab two times a day as needed 14)  Renagel 800 Mg Tabs (Sevelamer hcl) .... 3 three times a day and 1-2 extra with snack 15)  Phoslo 667 Mg Caps (Calcium acetate (phos binder)) .... 2 with meals three times a day 1-2 extra with snacks 16)   Analpram-hc 1-2.5 % Crea (Hydrocortisone ace-pramoxine) .... Apply per rectum two times a day 17)  Dulcolax 10 Mg Supp (Bisacodyl) .... One per rectum every 8 hours as needed for constipation 18)  Zofran 4 Mg Tabs (Ondansetron hcl) .Marland Kitchen.. 1 every 6-8 hours as needed nausea and vomiting 19)  Midodrine Hcl 10 Mg Tabs (Midodrine hcl) .Marland Kitchen.. 1 three times a day 20)  Fexofenadine Hcl 180 Mg Tabs (Fexofenadine hcl) .... One by mouth daily prn 21)  Bactroban 2 % Oint (Mupirocin) .... Apply to affected area buttocks two times a day.  Patient Instructions: 1)  Please schedule a follow-up appointment in 4 months.   Orders Added: 1)  Est. Patient Level IV [30865]

## 2010-08-17 NOTE — Progress Notes (Signed)
Summary: Pt req refill of Delsyn 12 hr cough syrup  Phone Note Call from Patient Call back at [facility] Phone [phone]   Summary of Call: Pt is req refill of Delsym 24hr cough syrup. Pls call in to [facility] in [place].  Initial call taken by: Lucy Antigua,  July 13, 2010 1:45 PM  Follow-up for Phone Call        med OTC l/m on voicemail Follow-up by: Alfred Levins, CMA,  July 13, 2010 4:54 PM

## 2010-08-18 ENCOUNTER — Inpatient Hospital Stay (HOSPITAL_COMMUNITY): Payer: Medicare Other

## 2010-08-18 LAB — RENAL FUNCTION PANEL
Albumin: 3.7 g/dL (ref 3.5–5.2)
Calcium: 9.9 mg/dL (ref 8.4–10.5)
GFR calc Af Amer: 9 mL/min — ABNORMAL LOW (ref >60)
GFR calc non Af Amer: 7 mL/min — ABNORMAL LOW (ref >60)
Phosphorus: 4.7 mg/dL — ABNORMAL HIGH (ref 2.3–4.6)
Potassium: 6.5 mEq/L (ref 3.5–5.1)
Sodium: 136 mEq/L (ref 135–145)

## 2010-08-18 LAB — MRSA PCR SCREENING: MRSA by PCR: POSITIVE — AB

## 2010-08-18 LAB — HEPATITIS B SURFACE ANTIGEN: Hepatitis B Surface Ag: NEGATIVE

## 2010-08-18 NOTE — Miscellaneous (Signed)
Summary: Care Plan/[facility]  Care Plan/[facility]   Imported By: Maryln Gottron 08/22/2009 11:11:59  _____________________________________________________________________  External Attachment:    Type:   Image     Comment:   External Document

## 2010-08-19 LAB — CBC
MCHC: 31.6 g/dL (ref 30.0–36.0)
Platelets: 125 10*3/uL — ABNORMAL LOW (ref 150–400)
RDW: 17.4 % — ABNORMAL HIGH (ref 11.5–15.5)
WBC: 5.8 10*3/uL (ref 4.0–10.5)

## 2010-08-19 LAB — BASIC METABOLIC PANEL
Calcium: 9.8 mg/dL (ref 8.4–10.5)
GFR calc non Af Amer: 7 mL/min — ABNORMAL LOW (ref >60)
Potassium: 5.5 mEq/L — ABNORMAL HIGH (ref 3.5–5.1)
Sodium: 134 mEq/L — ABNORMAL LOW (ref 135–145)

## 2010-08-20 LAB — BASIC METABOLIC PANEL
BUN: 53 mg/dL — ABNORMAL HIGH (ref 6–23)
Chloride: 95 mEq/L — ABNORMAL LOW (ref 96–112)
Creatinine, Ser: 7.6 mg/dL — ABNORMAL HIGH (ref 0.4–1.2)
GFR calc Af Amer: 7 mL/min — ABNORMAL LOW (ref >60)
GFR calc non Af Amer: 6 mL/min — ABNORMAL LOW (ref >60)
Potassium: 5.5 mEq/L — ABNORMAL HIGH (ref 3.5–5.1)

## 2010-08-21 ENCOUNTER — Inpatient Hospital Stay (HOSPITAL_COMMUNITY): Payer: Medicare Other

## 2010-08-21 LAB — PROTIME-INR: Prothrombin Time: 13.2 seconds (ref 11.6–15.2)

## 2010-08-21 LAB — BASIC METABOLIC PANEL
BUN: 75 mg/dL — ABNORMAL HIGH (ref 6–23)
Chloride: 96 mEq/L (ref 96–112)
GFR calc non Af Amer: 4 mL/min — ABNORMAL LOW (ref >60)
Potassium: 6.2 mEq/L — ABNORMAL HIGH (ref 3.5–5.1)
Sodium: 134 mEq/L — ABNORMAL LOW (ref 135–145)

## 2010-08-21 LAB — CBC
HCT: 31.5 % — ABNORMAL LOW (ref 36.0–46.0)
MCV: 105.7 fL — ABNORMAL HIGH (ref 78.0–100.0)
Platelets: 132 10*3/uL — ABNORMAL LOW (ref 150–400)
RBC: 2.98 MIL/uL — ABNORMAL LOW (ref 3.87–5.11)
RDW: 16.6 % — ABNORMAL HIGH (ref 11.5–15.5)
WBC: 6.4 10*3/uL (ref 4.0–10.5)

## 2010-08-21 LAB — GLUCOSE, CAPILLARY: Glucose-Capillary: 106 mg/dL — ABNORMAL HIGH (ref 70–99)

## 2010-08-21 LAB — APTT: aPTT: 23 seconds — ABNORMAL LOW (ref 24–37)

## 2010-08-21 MED ORDER — IOHEXOL 300 MG/ML  SOLN
100.0000 mL | Freq: Once | INTRAMUSCULAR | Status: AC | PRN
  Administered 2010-08-21: 65 mL via INTRAVENOUS

## 2010-08-28 LAB — POCT I-STAT 4, (NA,K, GLUC, HGB,HCT)
Glucose, Bld: 91 mg/dL (ref 70–99)
Glucose, Bld: 77 mg/dL (ref 70–99)
HCT: 39 % (ref 36.0–46.0)
Hemoglobin: 13.3 g/dL (ref 12.0–15.0)
Potassium: 4.4 mEq/L (ref 3.5–5.1)
Sodium: 137 mEq/L (ref 135–145)

## 2010-09-12 IMAGING — CR DG CHEST 2V
1 series · 1 of 1 positions shown · non-contrast
Comparison: 06/29/2008

CLINICAL DATA: Dialysis patient.  Cough.

CHEST - 2 VIEW

[w chest lat *]
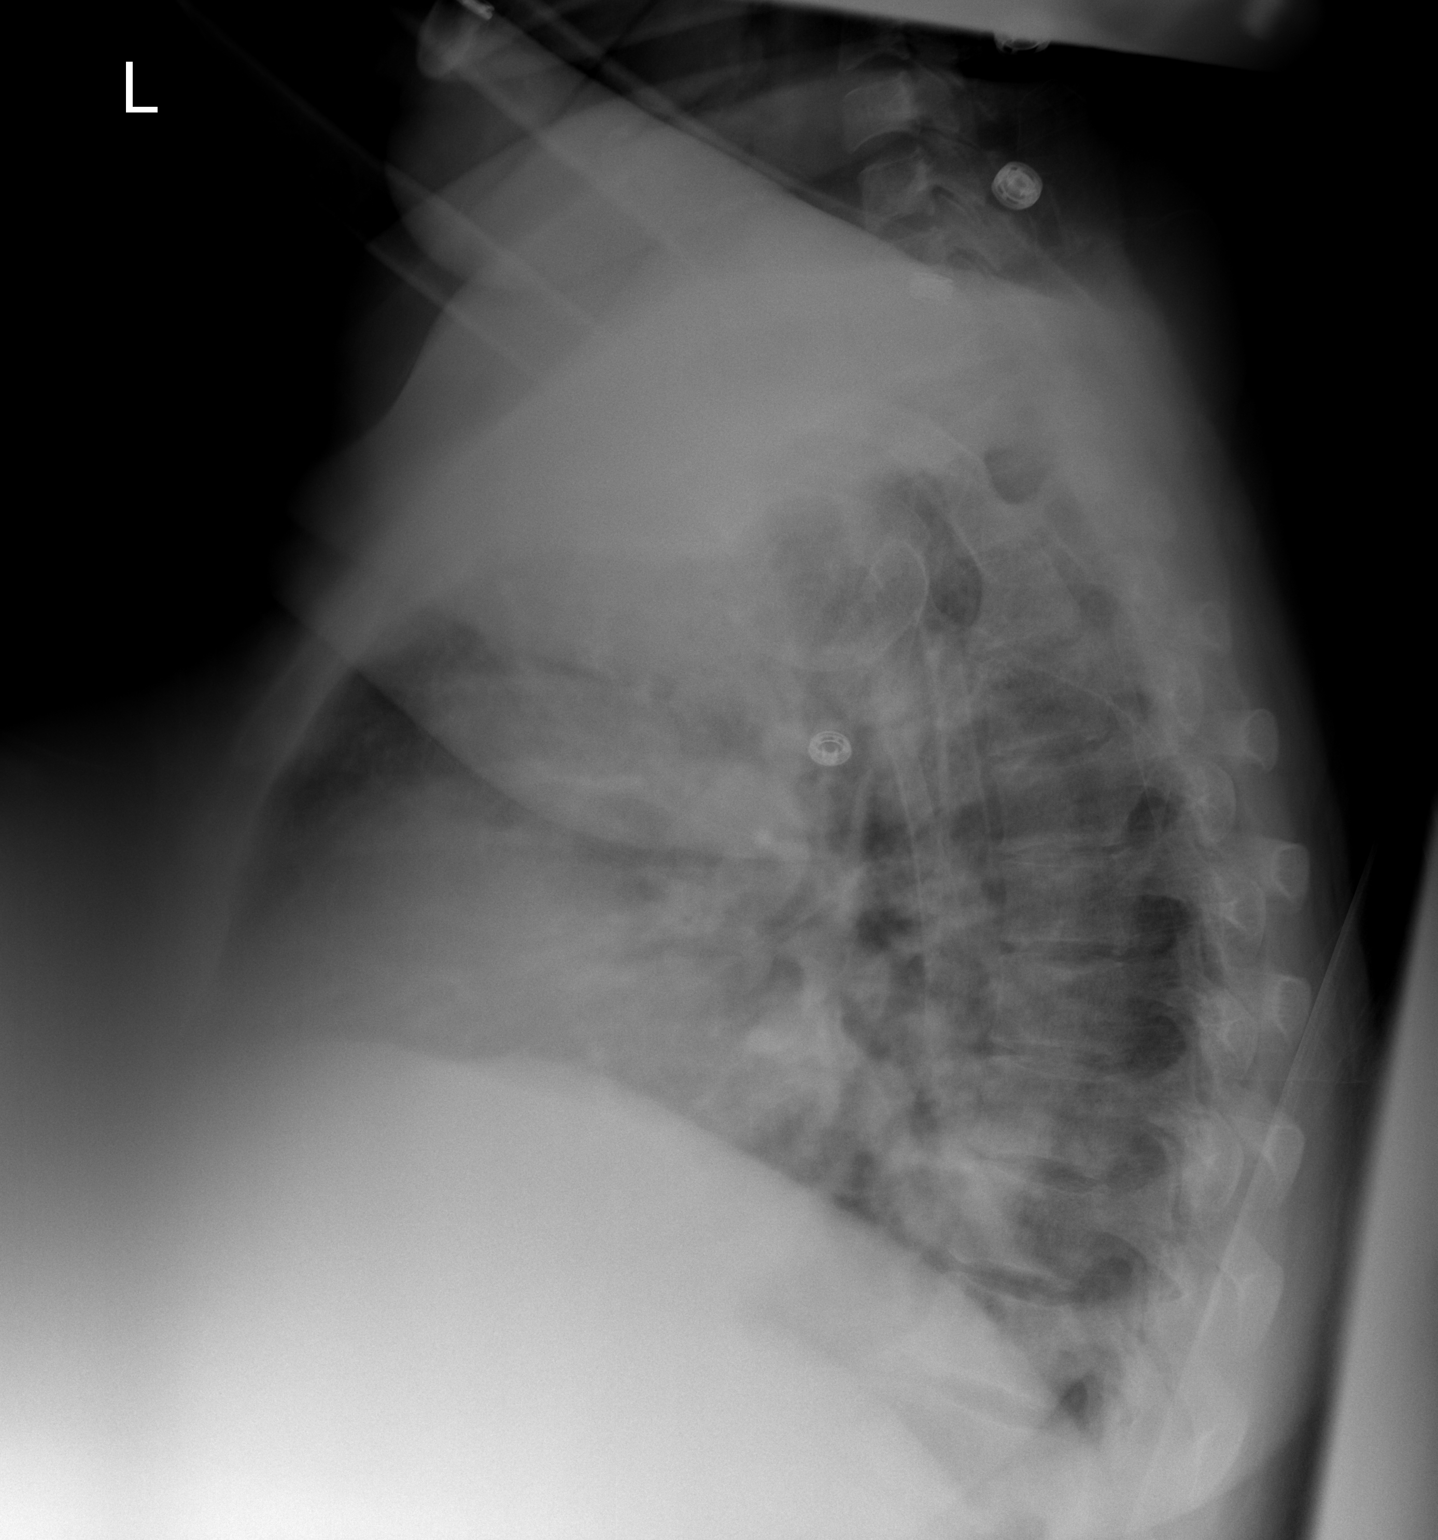

[1 of 1 positions shown; findings below may reference images not displayed]

FINDINGS: Cardiomegaly and pulmonary vascular congestion.  Possible
mild atelectasis at the right base.  No findings to strongly
suggest pneumonia.  Lungs are hyperaerated.
IMPRESSION: Cardiomegaly pulmonary vascular ingestion.  Mild right base
atelectasis.  COPD.

## 2010-09-12 IMAGING — CT CT HEAD W/O CM
1 of 2 series · 15 of 30 positions shown, 19 images · non-contrast
Comparison: Head CT 02/22/2007 and 03/02/2006

CLINICAL DATA: Dialysis patient.  Cough.  Weakness

CT HEAD WITHOUT CONTRAST
TECHNIQUE: Contiguous axial images were obtained from the base of
the skull through the vertex without contrast.

[Series 2: brain · axial · 0.47mm/px · z∈[+122,+262]mm · 15 of 30 slices shown, 19 images]
[im 2/30  brain]
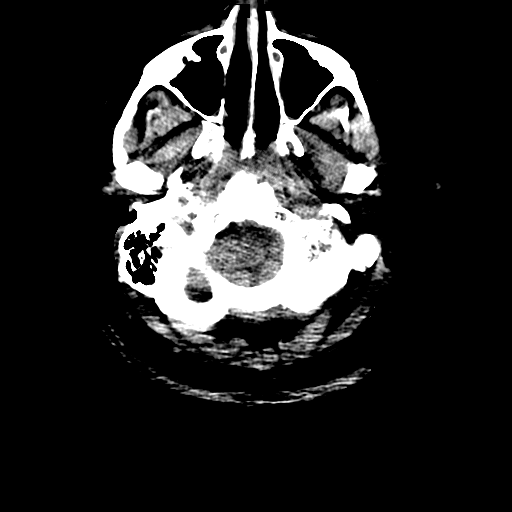
[im 2/30  bone]
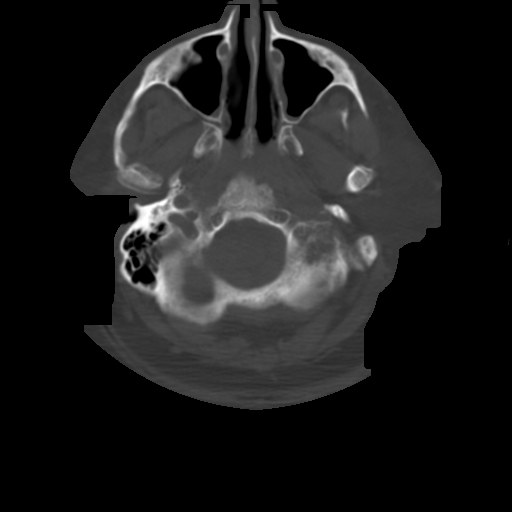
[im 4/30  brain]
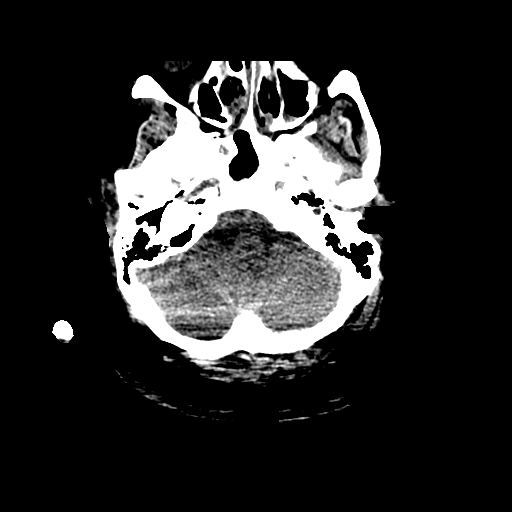
[im 6/30  brain]
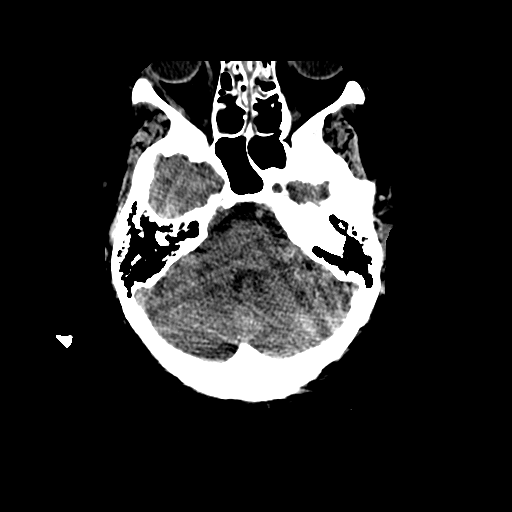
[im 8/30  brain]
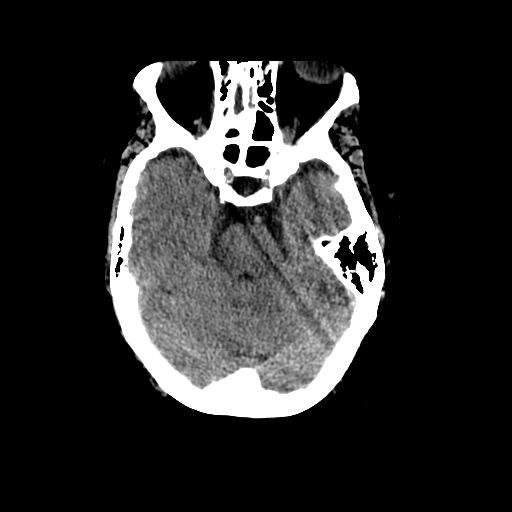
[im 10/30  brain]
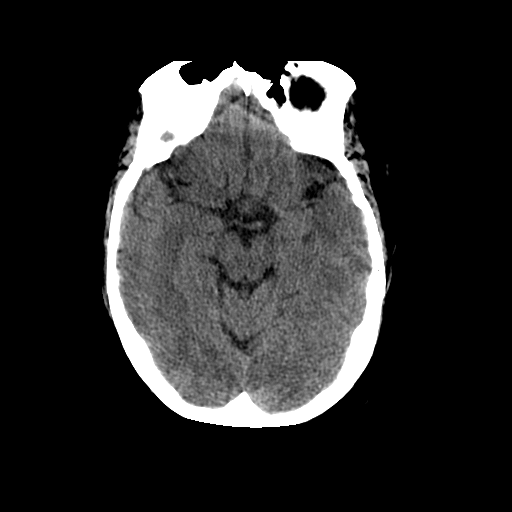
[im 10/30  bone]
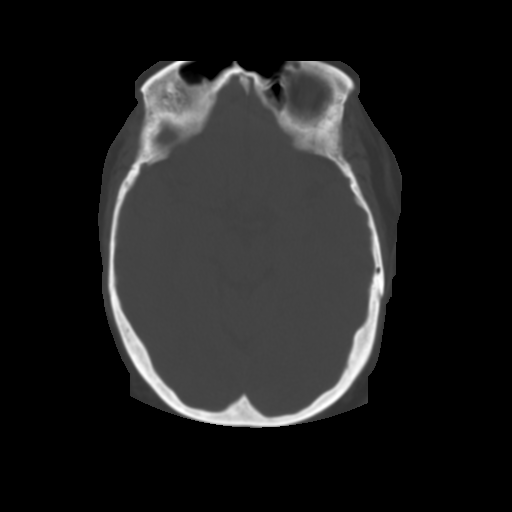
[im 11/30  brain]
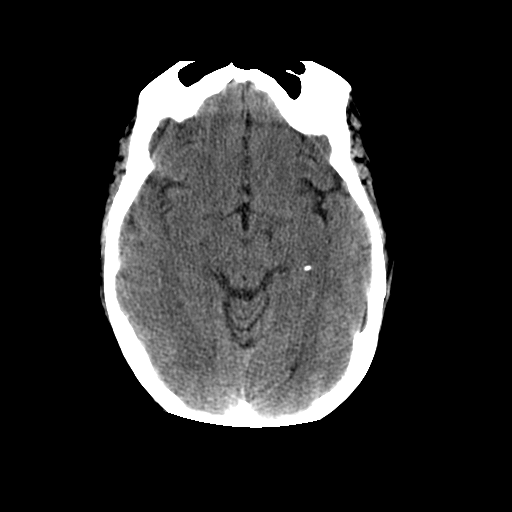
[im 13/30  brain]
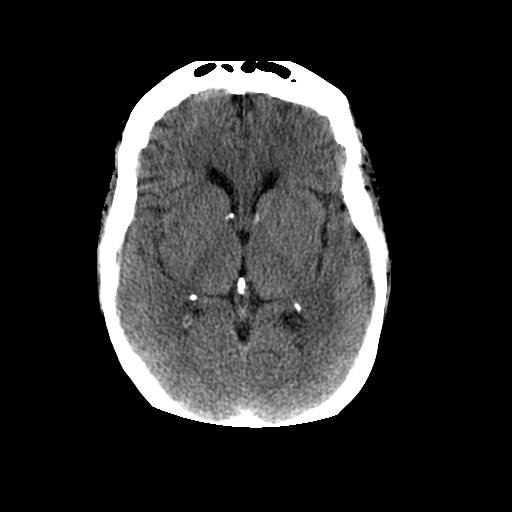
[im 15/30  brain]
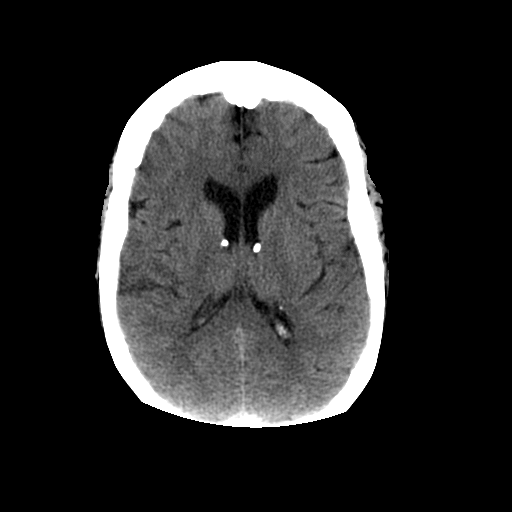
[im 17/30  brain]
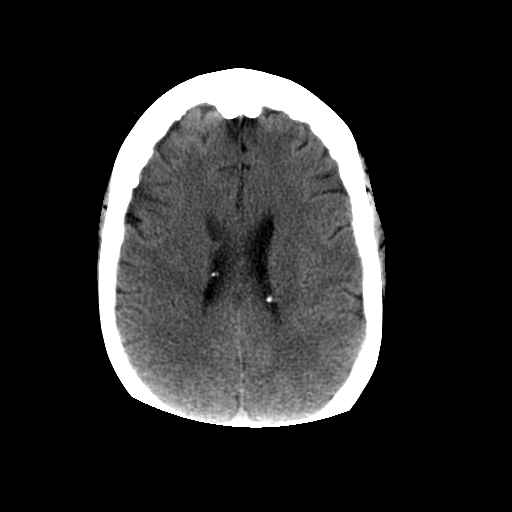
[im 17/30  bone]
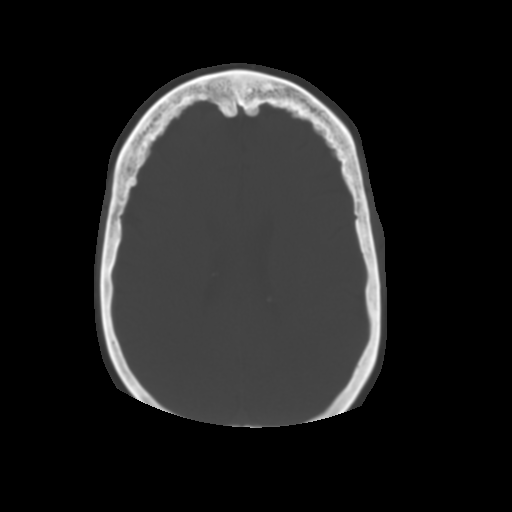
[im 19/30  brain]
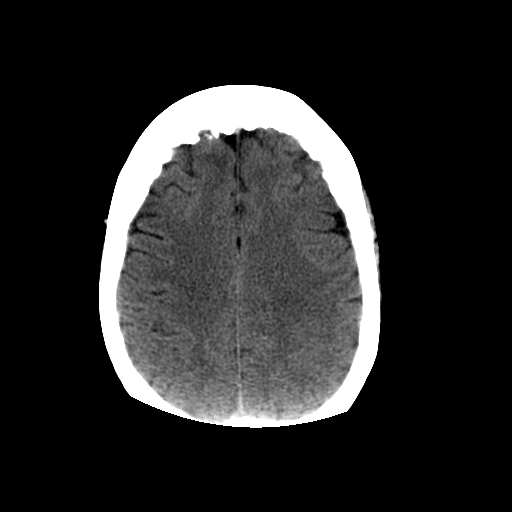
[im 20/30  brain]
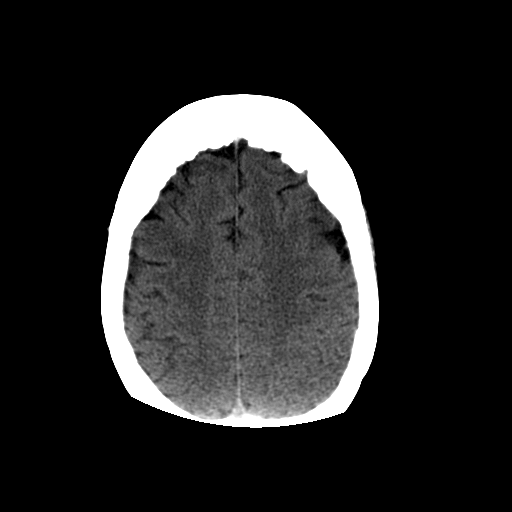
[im 22/30  brain]
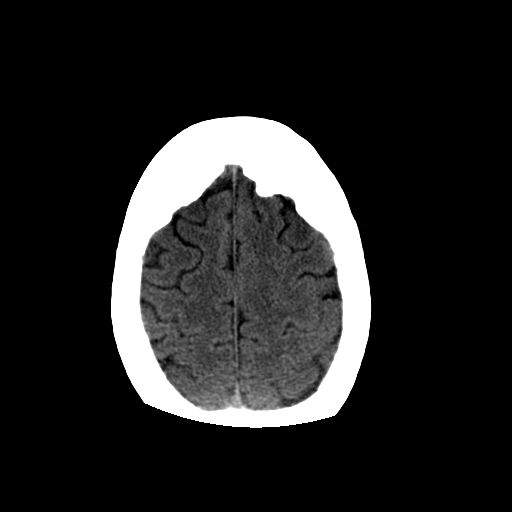
[im 24/30  brain]
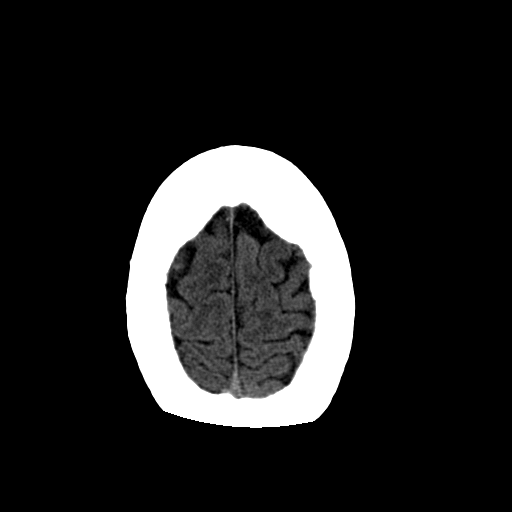
[im 24/30  bone]
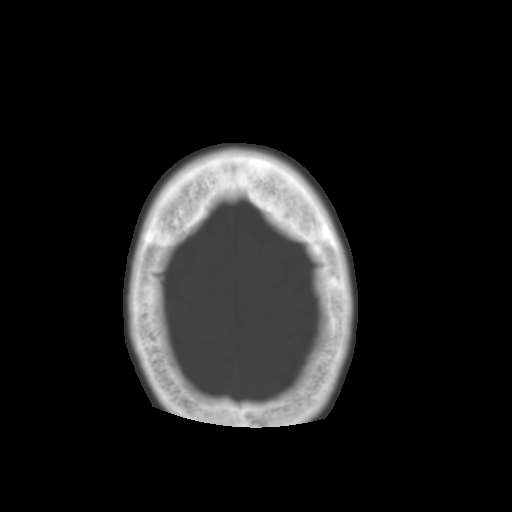
[im 26/30  brain]
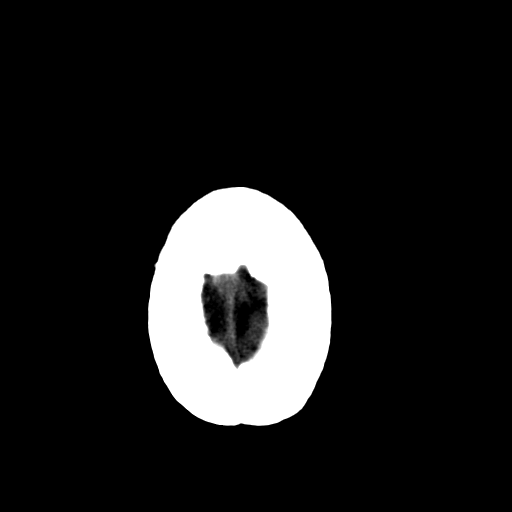
[im 28/30  brain]
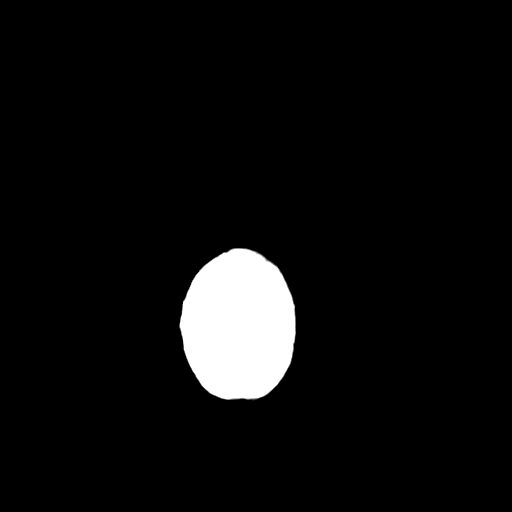

[15 of 30 positions shown; findings below may reference images not displayed]

FINDINGS: No intra or extra-axial hemorrhage is identified.  No
evidence of acute cortically based infarction.  Negative for mass
effect, hydrocephalus, or midline shift.

Multiple subependymal calcified nodules adjacent to the lateral
ventricles bilaterally, the largest measuring 5 mm on the left.
These are unchanged from prior examinations.

There is mucosal thickening, and likely fluid wihtin multiple
ethmoid air cells bilaterally.  The visualized portions of the
maxillary sinuses show a small amount of fluid posteriorly
bilaterally, left greater than right.  Mucosal thickening of the
left sphenoid sinus.  Mastoid air cells are clear.  Hyperostosis
frontalis interna is noted.
IMPRESSION: 1. Stable head CT.  No acute intracranial findings.
2.  Stable subependymal calcifications along the lateral ventricles
are most likely related to tuberous sclerosis.  Sequela of prior
intrauterine TORCH infection is also a consideration.
3.  Sinusitis, predominately involving the ethmoid air cells and
maxillary sinuses. Suspect acute on chronic sinusitis.

## 2010-09-22 NOTE — Discharge Summary (Signed)
NAMETOYOKO, SILOS NO.:  [id number]  MEDICAL RECORD NO.:  [id number]           PATIENT TYPE:  I  LOCATION:  6735                         FACILITY:  [facility]  PHYSICIAN:  Dyke Maes, M.D.DATE OF BIRTH:  1955-11-09  DATE OF ADMISSION:  08/17/2010 DATE OF DISCHARGE:  08/21/2010                              DISCHARGE SUMMARY   ADMISSION DIAGNOSES: 1. Hyperkalemia. 2. Bradycardia, secondary to hyperkalemia. 3. Clotted dialysis access. 4. End-stage renal disease, on chronic hemodialysis.  DISCHARGE DIAGNOSES: 1. Hyperkalemia. 2. Bradycardia, secondary to hyperkalemia. 3. Clotted right arm HeRO-dialysis access. 4. End-stage renal disease, on chronic hemodialysis. 5. Anemia of chronic disease. 6. Secondary hyperparathyroidism. 7. Chronic obstructive pulmonary disease. 8. Severe obesity. 9. Anxiety. 10.Hypotension, on hemodialysis.  BRIEF HISTORY AND REASON FOR THE ADMISSION:  Ms. Rayson is a 55 year old white female with end-stage renal disease, on chronic hemodialysis at [facility], who reportedly passed out when she was at assisted living where she lives, hit her head.  She was brought to the ER.  She was noted as having bradycardia.  Potassium was 7.4, increasing in the ER.  She was seen by Dr. Primitivo Gauze.  It should be noted that she has history of end-stage renal disease, obstructive sleep apnea, schizophrenia/depression, seizure disorder, tuberous sclerosis, anemia of chronic disease and iron deficiency, secondary hyperparathyroidism.  PHYSICAL EXAMINATION:  By Dr. Briant Cedar on admission: VITAL SIGNS:  Temperature was 96.9, pulse of 61.  Her blood pressure was 130/59. GENERAL APPEARANCE:  An obese white female. HEENT:  Within normal limits. NECK:  Showed neck normal. LUNGS:  Clear to auscultation. CARDIAC:  Regular rate and rhythm auscultated when he examined her. ABDOMEN:  Bowel sounds were positive and within  normal limits.  Abdomen was nontender, nondistended.  No hepatosplenomegaly.  She had a positive ventral hernia. EXTREMITIES:  Revealed no pedal edema.  Right upper arm HeRO-graph.  No bruit was heard. SKIN:  Reveals multiple tupar-type formation on the face. NEUROLOGIC:  She is awake, alert, and oriented x3.  ADMITTING LABORATORY DATA:  Potassium is 7.4.  CT scan of the spine was negative for fracture.  The patient was admitted for emergent hemodialysis by Dr. Briant Cedar with a femoral hemodialysis PermCath placed by Dr. Briant Cedar for hemodialysis and then consult.  I ordered for declotting of her HeRO-graph, when she is stable.  COURSE IN HOSPITAL:  The patient is admitted by Dr. Briant Cedar as noted above because of hyperkalemia.  Right femoral tip of right catheter was flushed without difficulty.  She received hemodialysis.  Potassium improved to 5.4.  Dr. Allena Katz gave the patient some Kayexalate when she had potassium of 5.5.  Her HeRO was declotted on August 21, 2010, and she does have known history of contrast allergy, needed contrast bed, prednisone, Benadryl before this was done.  The declot was done by Dr. Richarda Overlie, interventionist for IR with good results.  Potassium rechecked on 08/21/2010 was 6.2, and she was brought to hemodialysis on August 21, 2010 using her right hemograph without difficulty, and the patient having no complaints.  She was in sinus rhythm and deemed stable for discharge  back to her nursing home/assisted living by Dr. Allena Katz.  Anemia:  The patient is noted to have a hemoglobin of 10.3.  Previously Epogen was being held with her hemoglobin over 12.  It was restarted during his hospital, and she will be on 26000 units of Epogen at the time of discharge.  Seizure disorders:  The patient was continued on medications without any incident during this hospital stay.  Obstructive sleep apnea/COPD:  The patient is not complaint with his CPAP as noted in the  past.  She was continued on Advair Diskus during this hospital stay without any incident.  Secondary hyperparathyroidism:  The patient was continued on her binders of PhosLo and Renagel.  Calcium is stable at 9.8.  Hypotension:  The patient had a blood pressure of 133/82 on the day of discharge.  She receives midodrine 10 mg t.i.d., which will be continued as an outpatient.  CONDITION AT THE TIME OF DISCHARGED:  Improved.  DIET:  An 80-gram protein, 2-gram potassium, 2-gram sodium with limiting total fluids to 1200 mL p.o. daily.  MEDICATIONS AT THE TIME OF DISCHARGE: 1. Advair Diskus 250/50 one puff b.i.d. 2. Calcium as stated 667 mg 3 capsules t.i.d. with meals. 3. Clonazepam 0.5 mg p.o. t.i.d. dialysis days, Monday, Wednesday, and     Friday. 4. Cymbalta 40 mg p.o. every 8 a.m. and 8 p.m., dosing stimulated by     mouth b.i.d. p.r.n. cough. 5. Docusate 100 mg b.i.d. 6. Fluconazole nasal spray 50 mcg once per each nostril b.i.d. 7. Midodrine 10 mg t.i.d. before meals. 8. Mupirocin topical ointment apply topically b.i.d. to areas before     discharge. 9. Protonix 40 mg at bedtime. 10.Propylene glycol 17 grams every morning. 11.Primidone 250 mg b.i.d. 12.Rena-Vite every q.a.m. 13.Renvela 800 mg 2 p.o. t.i.d. with meals. 14.Tylenol with Codeine #3 one tablet every 6 hours p.r.n. pain. 15.Epogen 26000 units with every hemodialysis.  DRY WEIGHT:  Her dry weight will be increased slightly at the time of discharge to 138.5 kg.  ACTIVITIES:  Same as before, as tolerated ambulating.     Donald Pore, P.A.   ______________________________ Dyke Maes, M.D.    DWZ/MEDQ  D:  08/21/2010  T:  08/21/2010  Job:  [id number]  cc:   Zetta Bills, MD  Electronically Signed by Lenny Pastel P.A. on 09/13/2010 03:19:10 PM Electronically Signed by Primitivo Gauze M.D. on 09/21/2010 07:09:27 PM

## 2010-09-25 LAB — CBC
HCT: 22.6 % — ABNORMAL LOW (ref 36.0–46.0)
HCT: 23.1 % — ABNORMAL LOW (ref 36.0–46.0)
HCT: 24.9 % — ABNORMAL LOW (ref 36.0–46.0)
HCT: 25 % — ABNORMAL LOW (ref 36.0–46.0)
HCT: 25.4 % — ABNORMAL LOW (ref 36.0–46.0)
HCT: 27.1 % — ABNORMAL LOW (ref 36.0–46.0)
HCT: 28.6 % — ABNORMAL LOW (ref 36.0–46.0)
HCT: 29.7 % — ABNORMAL LOW (ref 36.0–46.0)
Hemoglobin: 7.4 g/dL — ABNORMAL LOW (ref 12.0–15.0)
Hemoglobin: 7.5 g/dL — ABNORMAL LOW (ref 12.0–15.0)
Hemoglobin: 8.3 g/dL — ABNORMAL LOW (ref 12.0–15.0)
Hemoglobin: 8.7 g/dL — ABNORMAL LOW (ref 12.0–15.0)
Hemoglobin: 9.2 g/dL — ABNORMAL LOW (ref 12.0–15.0)
Hemoglobin: 9.6 g/dL — ABNORMAL LOW (ref 12.0–15.0)
MCH: 32.6 pg (ref 26.0–34.0)
MCH: 33.3 pg (ref 26.0–34.0)
MCH: 33.3 pg (ref 26.0–34.0)
MCH: 33.6 pg (ref 26.0–34.0)
MCH: 34 pg (ref 26.0–34.0)
MCH: 34.3 pg — ABNORMAL HIGH (ref 26.0–34.0)
MCH: 34.9 pg — ABNORMAL HIGH (ref 26.0–34.0)
MCHC: 32.7 g/dL (ref 30.0–36.0)
MCHC: 31.6 g/dL (ref 30.0–36.0)
MCHC: 32.2 g/dL (ref 30.0–36.0)
MCHC: 32.3 g/dL (ref 30.0–36.0)
MCHC: 32.5 g/dL (ref 30.0–36.0)
MCHC: 33.1 g/dL (ref 30.0–36.0)
MCHC: 33.3 g/dL (ref 30.0–36.0)
MCHC: 33.7 g/dL (ref 30.0–36.0)
MCV: 99.6 fL (ref 78.0–100.0)
MCV: 100 fL (ref 78.0–100.0)
MCV: 100.8 fL — ABNORMAL HIGH (ref 78.0–100.0)
MCV: 100.8 fL — ABNORMAL HIGH (ref 78.0–100.0)
MCV: 103.6 fL — ABNORMAL HIGH (ref 78.0–100.0)
MCV: 105.4 fL — ABNORMAL HIGH (ref 78.0–100.0)
MCV: 108 fL — ABNORMAL HIGH (ref 78.0–100.0)
MCV: 108.7 fL — ABNORMAL HIGH (ref 78.0–100.0)
MCV: 108.8 fL — ABNORMAL HIGH (ref 78.0–100.0)
Platelets: 117 10*3/uL — ABNORMAL LOW (ref 150–400)
Platelets: 121 10*3/uL — ABNORMAL LOW (ref 150–400)
Platelets: 145 10*3/uL — ABNORMAL LOW (ref 150–400)
RBC: 2.76 MIL/uL — ABNORMAL LOW (ref 3.87–5.11)
RDW: 16.8 % — ABNORMAL HIGH (ref 11.5–15.5)
RDW: 13.6 % (ref 11.5–15.5)
RDW: 13.7 % (ref 11.5–15.5)
RDW: 15.8 % — ABNORMAL HIGH (ref 11.5–15.5)
RDW: 16.3 % — ABNORMAL HIGH (ref 11.5–15.5)
RDW: 17.4 % — ABNORMAL HIGH (ref 11.5–15.5)
RDW: 17.5 % — ABNORMAL HIGH (ref 11.5–15.5)
RDW: 17.5 % — ABNORMAL HIGH (ref 11.5–15.5)
WBC: 4.7 10*3/uL (ref 4.0–10.5)
WBC: 4.8 10*3/uL (ref 4.0–10.5)
WBC: 5 10*3/uL (ref 4.0–10.5)
WBC: 6.1 10*3/uL (ref 4.0–10.5)

## 2010-09-25 LAB — RENAL FUNCTION PANEL
BUN: 36 mg/dL — ABNORMAL HIGH (ref 6–23)
BUN: 30 mg/dL — ABNORMAL HIGH (ref 6–23)
BUN: 48 mg/dL — ABNORMAL HIGH (ref 6–23)
BUN: 79 mg/dL — ABNORMAL HIGH (ref 6–23)
CO2: 26 mEq/L (ref 19–32)
CO2: 25 mEq/L (ref 19–32)
CO2: 25 mEq/L (ref 19–32)
Calcium: 9.1 mg/dL (ref 8.4–10.5)
Calcium: 9.2 mg/dL (ref 8.4–10.5)
Calcium: 9.4 mg/dL (ref 8.4–10.5)
Chloride: 101 mEq/L (ref 96–112)
Chloride: 104 mEq/L (ref 96–112)
GFR calc Af Amer: 10 mL/min — ABNORMAL LOW (ref >60)
GFR calc non Af Amer: 8 mL/min — ABNORMAL LOW (ref >60)
Glucose, Bld: 111 mg/dL — ABNORMAL HIGH (ref 70–99)
Glucose, Bld: 107 mg/dL — ABNORMAL HIGH (ref 70–99)
Glucose, Bld: 82 mg/dL (ref 70–99)
Glucose, Bld: 96 mg/dL (ref 70–99)
Phosphorus: 4.8 mg/dL — ABNORMAL HIGH (ref 2.3–4.6)
Phosphorus: 3.9 mg/dL (ref 2.3–4.6)
Phosphorus: 4.4 mg/dL (ref 2.3–4.6)
Phosphorus: 4.6 mg/dL (ref 2.3–4.6)
Phosphorus: 5.4 mg/dL — ABNORMAL HIGH (ref 2.3–4.6)
Potassium: 5.3 mEq/L — ABNORMAL HIGH (ref 3.5–5.1)
Potassium: 5.7 mEq/L — ABNORMAL HIGH (ref 3.5–5.1)
Potassium: 5.8 mEq/L — ABNORMAL HIGH (ref 3.5–5.1)
Sodium: 135 mEq/L (ref 135–145)
Sodium: 136 mEq/L (ref 135–145)

## 2010-09-25 LAB — POCT I-STAT, CHEM 8
BUN: 32 mg/dL — ABNORMAL HIGH (ref 6–23)
BUN: 75 mg/dL — ABNORMAL HIGH (ref 6–23)
Calcium, Ion: 1 mmol/L — ABNORMAL LOW (ref 1.12–1.32)
Chloride: 108 mEq/L (ref 96–112)
Creatinine, Ser: 9.6 mg/dL — ABNORMAL HIGH (ref 0.4–1.2)
Glucose, Bld: 111 mg/dL — ABNORMAL HIGH (ref 70–99)
Glucose, Bld: 78 mg/dL (ref 70–99)
HCT: 28 % — ABNORMAL LOW (ref 36.0–46.0)
Potassium: 5.9 mEq/L — ABNORMAL HIGH (ref 3.5–5.1)
TCO2: 23 mmol/L (ref 0–100)

## 2010-09-25 LAB — CROSSMATCH
ABO/RH(D): A POS
Antibody Screen: POSITIVE
DAT, IgG: NEGATIVE
Donor AG Type: NEGATIVE
Donor AG Type: NEGATIVE
Donor AG Type: NEGATIVE
Unit division: 0

## 2010-09-25 LAB — DIFFERENTIAL
Basophils Absolute: 0 10*3/uL (ref 0.0–0.1)
Basophils Relative: 0 % (ref 0–1)
Basophils Relative: 0 % (ref 0–1)
Eosinophils Absolute: 0.2 10*3/uL (ref 0.0–0.7)
Eosinophils Absolute: 0.2 10*3/uL (ref 0.0–0.7)
Eosinophils Relative: 3 % (ref 0–5)
Lymphocytes Relative: 22 % (ref 12–46)
Lymphs Abs: 1 10*3/uL (ref 0.7–4.0)
Lymphs Abs: 1.3 10*3/uL (ref 0.7–4.0)
Monocytes Absolute: 0.3 10*3/uL (ref 0.1–1.0)
Monocytes Relative: 4 % (ref 3–12)
Neutro Abs: 3.3 10*3/uL (ref 1.7–7.7)
Neutrophils Relative %: 75 % (ref 43–77)

## 2010-09-25 LAB — POCT I-STAT 3, ART BLOOD GAS (G3+)
Patient temperature: 97.4
TCO2: 26 mmol/L (ref 0–100)
pH, Arterial: 7.376 (ref 7.350–7.400)

## 2010-09-25 LAB — LACTIC ACID, PLASMA: Lactic Acid, Venous: 1.7 mmol/L (ref 0.5–2.2)

## 2010-09-25 LAB — COMPREHENSIVE METABOLIC PANEL
ALT: 20 U/L (ref 0–35)
CO2: 24 mEq/L (ref 19–32)
Calcium: 8.4 mg/dL (ref 8.4–10.5)
GFR calc non Af Amer: 8 mL/min — ABNORMAL LOW (ref >60)
Glucose, Bld: 114 mg/dL — ABNORMAL HIGH (ref 70–99)
Sodium: 138 mEq/L (ref 135–145)

## 2010-09-25 LAB — FIBRINOGEN: Fibrinogen: 349 mg/dL (ref 204–475)

## 2010-09-25 LAB — CULTURE, BLOOD (ROUTINE X 2)
Culture  Setup Time: 201112072130
Culture  Setup Time: 201112072130
Culture: NO GROWTH

## 2010-09-25 LAB — CARDIAC PANEL(CRET KIN+CKTOT+MB+TROPI)
CK, MB: 2.2 ng/mL (ref 0.3–4.0)
Relative Index: INVALID (ref 0.0–2.5)
Relative Index: INVALID (ref 0.0–2.5)
Total CK: 54 U/L (ref 7–177)
Troponin I: 0.05 ng/mL (ref 0.00–0.06)
Troponin I: 0.06 ng/mL (ref 0.00–0.06)

## 2010-09-25 LAB — GLUCOSE, CAPILLARY
Glucose-Capillary: 123 mg/dL — ABNORMAL HIGH (ref 70–99)
Glucose-Capillary: 159 mg/dL — ABNORMAL HIGH (ref 70–99)
Glucose-Capillary: 92 mg/dL (ref 70–99)

## 2010-09-25 LAB — PROCALCITONIN: Procalcitonin: 0.34 ng/mL

## 2010-09-25 LAB — BASIC METABOLIC PANEL
BUN: 78 mg/dL — ABNORMAL HIGH (ref 6–23)
CO2: 21 mEq/L (ref 19–32)
CO2: 24 mEq/L (ref 19–32)
Calcium: 9.1 mg/dL (ref 8.4–10.5)
Chloride: 101 mEq/L (ref 96–112)
Creatinine, Ser: 7.57 mg/dL — ABNORMAL HIGH (ref 0.4–1.2)
Glucose, Bld: 111 mg/dL — ABNORMAL HIGH (ref 70–99)
Glucose, Bld: 158 mg/dL — ABNORMAL HIGH (ref 70–99)
Potassium: 5.3 mEq/L — ABNORMAL HIGH (ref 3.5–5.1)
Sodium: 139 mEq/L (ref 135–145)

## 2010-09-25 LAB — HEPATIC FUNCTION PANEL
ALT: 22 U/L (ref 0–35)
AST: 24 U/L (ref 0–37)
Albumin: 3.3 g/dL — ABNORMAL LOW (ref 3.5–5.2)
Total Protein: 6.8 g/dL (ref 6.0–8.3)

## 2010-09-25 LAB — MRSA PCR SCREENING: MRSA by PCR: NEGATIVE

## 2010-09-25 LAB — PROTIME-INR
INR: 1.14 (ref 0.00–1.49)
Prothrombin Time: 14.8 seconds (ref 11.6–15.2)

## 2010-09-25 LAB — VANCOMYCIN, RANDOM: Vancomycin Rm: 22 ug/mL

## 2010-09-25 LAB — CK TOTAL AND CKMB (NOT AT ARMC): CK, MB: 2.8 ng/mL (ref 0.3–4.0)

## 2010-09-25 LAB — CORTISOL: Cortisol, Plasma: 11.3 ug/dL

## 2010-09-25 LAB — IRON AND TIBC

## 2010-09-27 LAB — COMPREHENSIVE METABOLIC PANEL
ALT: 26 U/L (ref 0–35)
BUN: 70 mg/dL — ABNORMAL HIGH (ref 6–23)
Calcium: 10 mg/dL (ref 8.4–10.5)
Glucose, Bld: 86 mg/dL (ref 70–99)
Sodium: 136 mEq/L (ref 135–145)
Total Protein: 7.4 g/dL (ref 6.0–8.3)

## 2010-09-27 LAB — DIFFERENTIAL
Basophils Relative: 0 % (ref 0–1)
Eosinophils Relative: 2 % (ref 0–5)
Lymphocytes Relative: 17 % (ref 12–46)
Monocytes Relative: 4 % (ref 3–12)
Neutrophils Relative %: 77 % (ref 43–77)

## 2010-09-27 LAB — CBC
HCT: 31.8 % — ABNORMAL LOW (ref 36.0–46.0)
MCHC: 31.4 g/dL (ref 30.0–36.0)
RDW: 16.7 % — ABNORMAL HIGH (ref 11.5–15.5)

## 2010-09-27 LAB — HEMOCCULT GUIAC POC 1CARD (OFFICE): Fecal Occult Bld: POSITIVE

## 2010-09-28 LAB — POCT I-STAT, CHEM 8
Chloride: 104 mEq/L (ref 96–112)
Chloride: 105 mEq/L (ref 96–112)
Creatinine, Ser: 11 mg/dL — ABNORMAL HIGH (ref 0.4–1.2)
Glucose, Bld: 96 mg/dL (ref 70–99)
Glucose, Bld: 98 mg/dL (ref 70–99)
HCT: 38 % (ref 36.0–46.0)
HCT: 38 % (ref 36.0–46.0)
Potassium: 6.2 mEq/L — ABNORMAL HIGH (ref 3.5–5.1)
Potassium: 6.5 mEq/L (ref 3.5–5.1)
Sodium: 131 mEq/L — ABNORMAL LOW (ref 135–145)
Sodium: 131 mEq/L — ABNORMAL LOW (ref 135–145)

## 2010-09-28 LAB — BASIC METABOLIC PANEL
BUN: 85 mg/dL — ABNORMAL HIGH (ref 6–23)
Chloride: 95 mEq/L — ABNORMAL LOW (ref 96–112)
GFR calc non Af Amer: 4 mL/min — ABNORMAL LOW (ref >60)
Potassium: 5.8 mEq/L — ABNORMAL HIGH (ref 3.5–5.1)
Sodium: 131 mEq/L — ABNORMAL LOW (ref 135–145)

## 2010-09-28 LAB — CBC
Hemoglobin: 11.6 g/dL — ABNORMAL LOW (ref 12.0–15.0)
RBC: 3.45 MIL/uL — ABNORMAL LOW (ref 3.87–5.11)
WBC: 7.9 10*3/uL (ref 4.0–10.5)

## 2010-09-28 LAB — GLUCOSE, CAPILLARY
Glucose-Capillary: 100 mg/dL — ABNORMAL HIGH (ref 70–99)
Glucose-Capillary: 112 mg/dL — ABNORMAL HIGH (ref 70–99)
Glucose-Capillary: 173 mg/dL — ABNORMAL HIGH (ref 70–99)

## 2010-09-28 LAB — POTASSIUM
Potassium: 6.4 mEq/L (ref 3.5–5.1)
Potassium: 6.7 mEq/L (ref 3.5–5.1)

## 2010-09-28 LAB — MRSA PCR SCREENING: MRSA by PCR: NEGATIVE

## 2010-09-28 LAB — DIFFERENTIAL
Basophils Relative: 1 % (ref 0–1)
Lymphs Abs: 1.7 10*3/uL (ref 0.7–4.0)
Monocytes Relative: 5 % (ref 3–12)
Neutro Abs: 5.5 10*3/uL (ref 1.7–7.7)
Neutrophils Relative %: 70 % (ref 43–77)

## 2010-09-30 LAB — CBC
HCT: 25 % — ABNORMAL LOW (ref 36.0–46.0)
HCT: 28.2 % — ABNORMAL LOW (ref 36.0–46.0)
HCT: 29.1 % — ABNORMAL LOW (ref 36.0–46.0)
HCT: 31 % — ABNORMAL LOW (ref 36.0–46.0)
HCT: 32.1 % — ABNORMAL LOW (ref 36.0–46.0)
Hemoglobin: 10.5 g/dL — ABNORMAL LOW (ref 12.0–15.0)
Hemoglobin: 10.7 g/dL — ABNORMAL LOW (ref 12.0–15.0)
Hemoglobin: 8.5 g/dL — ABNORMAL LOW (ref 12.0–15.0)
Hemoglobin: 9.6 g/dL — ABNORMAL LOW (ref 12.0–15.0)
Hemoglobin: 9.8 g/dL — ABNORMAL LOW (ref 12.0–15.0)
MCH: 33.3 pg (ref 26.0–34.0)
MCH: 34.8 pg — ABNORMAL HIGH (ref 26.0–34.0)
MCH: 35.3 pg — ABNORMAL HIGH (ref 26.0–34.0)
MCH: 35.4 pg — ABNORMAL HIGH (ref 26.0–34.0)
MCHC: 33.7 g/dL (ref 30.0–36.0)
MCHC: 34 g/dL (ref 30.0–36.0)
MCHC: 34.4 g/dL (ref 30.0–36.0)
MCV: 103.9 fL — ABNORMAL HIGH (ref 78.0–100.0)
MCV: 104 fL — ABNORMAL HIGH (ref 78.0–100.0)
MCV: 106.1 fL — ABNORMAL HIGH (ref 78.0–100.0)
MCV: 98.7 fL (ref 78.0–100.0)
Platelets: 109 10*3/uL — ABNORMAL LOW (ref 150–400)
Platelets: 112 10*3/uL — ABNORMAL LOW (ref 150–400)
Platelets: 128 10*3/uL — ABNORMAL LOW (ref 150–400)
Platelets: 138 10*3/uL — ABNORMAL LOW (ref 150–400)
Platelets: 166 10*3/uL (ref 150–400)
RBC: 1.91 MIL/uL — ABNORMAL LOW (ref 3.87–5.11)
RBC: 2.4 MIL/uL — ABNORMAL LOW (ref 3.87–5.11)
RBC: 2.88 MIL/uL — ABNORMAL LOW (ref 3.87–5.11)
RBC: 2.95 MIL/uL — ABNORMAL LOW (ref 3.87–5.11)
RBC: 2.95 MIL/uL — ABNORMAL LOW (ref 3.87–5.11)
RBC: 3.02 MIL/uL — ABNORMAL LOW (ref 3.87–5.11)
RDW: 14.8 % (ref 11.5–15.5)
RDW: 15.3 % (ref 11.5–15.5)
RDW: 19.3 % — ABNORMAL HIGH (ref 11.5–15.5)
WBC: 5.3 10*3/uL (ref 4.0–10.5)
WBC: 5.4 10*3/uL (ref 4.0–10.5)
WBC: 5.8 10*3/uL (ref 4.0–10.5)
WBC: 6.9 10*3/uL (ref 4.0–10.5)
WBC: 9.2 10*3/uL (ref 4.0–10.5)

## 2010-09-30 LAB — BASIC METABOLIC PANEL
BUN: 24 mg/dL — ABNORMAL HIGH (ref 6–23)
BUN: 74 mg/dL — ABNORMAL HIGH (ref 6–23)
CO2: 28 mEq/L (ref 19–32)
Calcium: 10.5 mg/dL (ref 8.4–10.5)
Calcium: 8.7 mg/dL (ref 8.4–10.5)
Calcium: 9.3 mg/dL (ref 8.4–10.5)
Calcium: 9.4 mg/dL (ref 8.4–10.5)
Calcium: 9.8 mg/dL (ref 8.4–10.5)
Creatinine, Ser: 4.61 mg/dL — ABNORMAL HIGH (ref 0.4–1.2)
Creatinine, Ser: 5.5 mg/dL — ABNORMAL HIGH (ref 0.4–1.2)
GFR calc Af Amer: 10 mL/min — ABNORMAL LOW (ref >60)
GFR calc Af Amer: 15 mL/min — ABNORMAL LOW (ref >60)
GFR calc Af Amer: 6 mL/min — ABNORMAL LOW (ref >60)
GFR calc non Af Amer: 10 mL/min — ABNORMAL LOW (ref >60)
GFR calc non Af Amer: 12 mL/min — ABNORMAL LOW (ref >60)
GFR calc non Af Amer: 5 mL/min — ABNORMAL LOW (ref >60)
GFR calc non Af Amer: 6 mL/min — ABNORMAL LOW (ref >60)
GFR calc non Af Amer: 8 mL/min — ABNORMAL LOW (ref >60)
Glucose, Bld: 73 mg/dL (ref 70–99)
Potassium: 5 mEq/L (ref 3.5–5.1)
Potassium: 6.1 mEq/L — ABNORMAL HIGH (ref 3.5–5.1)
Sodium: 131 mEq/L — ABNORMAL LOW (ref 135–145)
Sodium: 132 mEq/L — ABNORMAL LOW (ref 135–145)
Sodium: 134 mEq/L — ABNORMAL LOW (ref 135–145)

## 2010-09-30 LAB — POCT I-STAT, CHEM 8
Creatinine, Ser: 9 mg/dL — ABNORMAL HIGH (ref 0.4–1.2)
Glucose, Bld: 242 mg/dL — ABNORMAL HIGH (ref 70–99)
Hemoglobin: 13.3 g/dL (ref 12.0–15.0)
Potassium: 7.1 mEq/L (ref 3.5–5.1)

## 2010-09-30 LAB — POCT I-STAT 3, ART BLOOD GAS (G3+)
Acid-Base Excess: 1 mmol/L (ref 0.0–2.0)
O2 Saturation: 100 %
TCO2: 27 mmol/L (ref 0–100)
pO2, Arterial: 173 mmHg — ABNORMAL HIGH (ref 80.0–100.0)

## 2010-09-30 LAB — CROSSMATCH
DAT, IgG: NEGATIVE
Donor AG Type: NEGATIVE

## 2010-09-30 LAB — COMPREHENSIVE METABOLIC PANEL
BUN: 74 mg/dL — ABNORMAL HIGH (ref 6–23)
CO2: 15 mEq/L — ABNORMAL LOW (ref 19–32)
Chloride: 92 mEq/L — ABNORMAL LOW (ref 96–112)
Creatinine, Ser: 7.92 mg/dL — ABNORMAL HIGH (ref 0.4–1.2)
GFR calc non Af Amer: 5 mL/min — ABNORMAL LOW (ref >60)
Glucose, Bld: 328 mg/dL — ABNORMAL HIGH (ref 70–99)
Total Bilirubin: 0.5 mg/dL (ref 0.3–1.2)

## 2010-09-30 LAB — DIFFERENTIAL
Basophils Absolute: 0 10*3/uL (ref 0.0–0.1)
Basophils Relative: 0 % (ref 0–1)
Lymphocytes Relative: 31 % (ref 12–46)
Neutro Abs: 7.4 10*3/uL (ref 1.7–7.7)
Neutrophils Relative %: 65 % (ref 43–77)

## 2010-09-30 LAB — RENAL FUNCTION PANEL
Albumin: 3.2 g/dL — ABNORMAL LOW (ref 3.5–5.2)
Albumin: 3.2 g/dL — ABNORMAL LOW (ref 3.5–5.2)
BUN: 34 mg/dL — ABNORMAL HIGH (ref 6–23)
BUN: 34 mg/dL — ABNORMAL HIGH (ref 6–23)
CO2: 23 mEq/L (ref 19–32)
Chloride: 89 mEq/L — ABNORMAL LOW (ref 96–112)
Chloride: 91 mEq/L — ABNORMAL LOW (ref 96–112)
Creatinine, Ser: 5.35 mg/dL — ABNORMAL HIGH (ref 0.4–1.2)
Creatinine, Ser: 5.54 mg/dL — ABNORMAL HIGH (ref 0.4–1.2)
GFR calc Af Amer: 10 mL/min — ABNORMAL LOW (ref >60)
GFR calc Af Amer: 10 mL/min — ABNORMAL LOW (ref >60)
GFR calc non Af Amer: 7 mL/min — ABNORMAL LOW (ref >60)
GFR calc non Af Amer: 8 mL/min — ABNORMAL LOW (ref >60)
GFR calc non Af Amer: 8 mL/min — ABNORMAL LOW (ref >60)
Glucose, Bld: 104 mg/dL — ABNORMAL HIGH (ref 70–99)
Phosphorus: 6.2 mg/dL — ABNORMAL HIGH (ref 2.3–4.6)
Potassium: 4.6 mEq/L (ref 3.5–5.1)

## 2010-09-30 LAB — MRSA PCR SCREENING: MRSA by PCR: NEGATIVE

## 2010-09-30 LAB — HEMOCCULT GUIAC POC 1CARD (OFFICE): Fecal Occult Bld: POSITIVE

## 2010-09-30 LAB — CULTURE, BLOOD (ROUTINE X 2): Culture: NO GROWTH

## 2010-09-30 LAB — APTT: aPTT: 28 seconds (ref 24–37)

## 2010-09-30 LAB — HEPATIC FUNCTION PANEL
AST: 24 U/L (ref 0–37)
Albumin: 3 g/dL — ABNORMAL LOW (ref 3.5–5.2)
Total Protein: 6.5 g/dL (ref 6.0–8.3)

## 2010-09-30 LAB — PTH, INTACT AND CALCIUM: PTH: 104.6 pg/mL — ABNORMAL HIGH (ref 14.0–72.0)

## 2010-09-30 LAB — PHOSPHORUS: Phosphorus: 6.4 mg/dL — ABNORMAL HIGH (ref 2.3–4.6)

## 2010-09-30 LAB — PROTIME-INR: Prothrombin Time: 13.8 seconds (ref 11.6–15.2)

## 2010-09-30 LAB — IRON AND TIBC
Iron: 100 ug/dL (ref 42–135)
TIBC: 266 ug/dL (ref 250–470)

## 2010-10-01 LAB — CBC
HCT: 35.2 % — ABNORMAL LOW (ref 36.0–46.0)
Hemoglobin: 11.8 g/dL — ABNORMAL LOW (ref 12.0–15.0)
MCV: 105.5 fL — ABNORMAL HIGH (ref 78.0–100.0)
RBC: 3.33 MIL/uL — ABNORMAL LOW (ref 3.87–5.11)
WBC: 6.4 10*3/uL (ref 4.0–10.5)

## 2010-10-01 LAB — POCT I-STAT, CHEM 8
BUN: 86 mg/dL — ABNORMAL HIGH (ref 6–23)
Creatinine, Ser: 9.8 mg/dL — ABNORMAL HIGH (ref 0.4–1.2)
Hemoglobin: 12.9 g/dL (ref 12.0–15.0)
Potassium: 7.1 mEq/L (ref 3.5–5.1)
Sodium: 129 mEq/L — ABNORMAL LOW (ref 135–145)

## 2010-10-01 LAB — DIFFERENTIAL
Eosinophils Relative: 3 % (ref 0–5)
Lymphocytes Relative: 27 % (ref 12–46)
Lymphs Abs: 1.7 10*3/uL (ref 0.7–4.0)
Monocytes Absolute: 0.2 10*3/uL (ref 0.1–1.0)

## 2010-10-01 LAB — GLUCOSE, CAPILLARY: Glucose-Capillary: 89 mg/dL (ref 70–99)

## 2010-10-01 LAB — POTASSIUM: Potassium: 6.6 mEq/L (ref 3.5–5.1)

## 2010-10-02 LAB — CROSSMATCH
ABO/RH(D): A POS
DAT, IgG: NEGATIVE
Donor AG Type: NEGATIVE

## 2010-10-02 LAB — BASIC METABOLIC PANEL
BUN: 22 mg/dL (ref 6–23)
BUN: 74 mg/dL — ABNORMAL HIGH (ref 6–23)
CO2: 26 mEq/L (ref 19–32)
CO2: 29 mEq/L (ref 19–32)
Calcium: 9.8 mg/dL (ref 8.4–10.5)
Calcium: 9.8 mg/dL (ref 8.4–10.5)
Chloride: 95 mEq/L — ABNORMAL LOW (ref 96–112)
GFR calc non Af Amer: 5 mL/min — ABNORMAL LOW (ref >60)
Glucose, Bld: 92 mg/dL (ref 70–99)
Glucose, Bld: 103 mg/dL — ABNORMAL HIGH (ref 70–99)
Potassium: 3.9 mEq/L (ref 3.5–5.1)
Sodium: 138 mEq/L (ref 135–145)

## 2010-10-02 LAB — CLOSTRIDIUM DIFFICILE EIA: C difficile Toxins A+B, EIA: NEGATIVE

## 2010-10-02 LAB — DIFFERENTIAL
Basophils Absolute: 0 10*3/uL (ref 0.0–0.1)
Basophils Relative: 0 % (ref 0–1)
Eosinophils Absolute: 0.2 10*3/uL (ref 0.0–0.7)
Eosinophils Absolute: 0.3 10*3/uL (ref 0.0–0.7)
Eosinophils Relative: 3 % (ref 0–5)
Eosinophils Relative: 2 % (ref 0–5)
Lymphs Abs: 1.5 10*3/uL (ref 0.7–4.0)
Monocytes Relative: 5 % (ref 3–12)

## 2010-10-02 LAB — COMPREHENSIVE METABOLIC PANEL
ALT: 103 U/L — ABNORMAL HIGH (ref 0–35)
ALT: 19 U/L (ref 0–35)
AST: 21 U/L (ref 0–37)
BUN: 87 mg/dL — ABNORMAL HIGH (ref 6–23)
CO2: 23 mEq/L (ref 19–32)
Calcium: 9.3 mg/dL (ref 8.4–10.5)
Chloride: 99 mEq/L (ref 96–112)
Creatinine, Ser: 7.48 mg/dL — ABNORMAL HIGH (ref 0.4–1.2)
Creatinine, Ser: 9.11 mg/dL — ABNORMAL HIGH (ref 0.4–1.2)
GFR calc Af Amer: 7 mL/min — ABNORMAL LOW (ref >60)
GFR calc non Af Amer: 6 mL/min — ABNORMAL LOW (ref >60)
Glucose, Bld: 100 mg/dL — ABNORMAL HIGH (ref 70–99)
Sodium: 134 mEq/L — ABNORMAL LOW (ref 135–145)
Sodium: 137 mEq/L (ref 135–145)
Total Bilirubin: 0.6 mg/dL (ref 0.3–1.2)
Total Protein: 7.1 g/dL (ref 6.0–8.3)

## 2010-10-02 LAB — RENAL FUNCTION PANEL
Albumin: 2.7 g/dL — ABNORMAL LOW (ref 3.5–5.2)
Albumin: 2.7 g/dL — ABNORMAL LOW (ref 3.5–5.2)
Albumin: 3.3 g/dL — ABNORMAL LOW (ref 3.5–5.2)
BUN: 52 mg/dL — ABNORMAL HIGH (ref 6–23)
BUN: 86 mg/dL — ABNORMAL HIGH (ref 6–23)
CO2: 19 mEq/L (ref 19–32)
CO2: 22 mEq/L (ref 19–32)
CO2: 23 mEq/L (ref 19–32)
Calcium: 8.9 mg/dL (ref 8.4–10.5)
Calcium: 9 mg/dL (ref 8.4–10.5)
Calcium: 9.6 mg/dL (ref 8.4–10.5)
Chloride: 94 mEq/L — ABNORMAL LOW (ref 96–112)
Chloride: 95 mEq/L — ABNORMAL LOW (ref 96–112)
Chloride: 95 mEq/L — ABNORMAL LOW (ref 96–112)
Creatinine, Ser: 5.86 mg/dL — ABNORMAL HIGH (ref 0.4–1.2)
Creatinine, Ser: 9.23 mg/dL — ABNORMAL HIGH (ref 0.4–1.2)
GFR calc Af Amer: 5 mL/min — ABNORMAL LOW (ref >60)
GFR calc Af Amer: 5 mL/min — ABNORMAL LOW (ref >60)
GFR calc Af Amer: 9 mL/min — ABNORMAL LOW (ref >60)
GFR calc non Af Amer: 4 mL/min — ABNORMAL LOW (ref >60)
GFR calc non Af Amer: 4 mL/min — ABNORMAL LOW (ref >60)
GFR calc non Af Amer: 8 mL/min — ABNORMAL LOW (ref >60)
Glucose, Bld: 101 mg/dL — ABNORMAL HIGH (ref 70–99)
Glucose, Bld: 98 mg/dL (ref 70–99)
Phosphorus: 7.2 mg/dL — ABNORMAL HIGH (ref 2.3–4.6)
Phosphorus: 8.8 mg/dL — ABNORMAL HIGH (ref 2.3–4.6)
Potassium: 3.7 mEq/L (ref 3.5–5.1)
Potassium: 5.1 mEq/L (ref 3.5–5.1)
Potassium: 5.7 mEq/L — ABNORMAL HIGH (ref 3.5–5.1)
Sodium: 131 mEq/L — ABNORMAL LOW (ref 135–145)
Sodium: 134 mEq/L — ABNORMAL LOW (ref 135–145)
Sodium: 135 mEq/L (ref 135–145)

## 2010-10-02 LAB — HEMOGLOBIN AND HEMATOCRIT, BLOOD
HCT: 30.4 % — ABNORMAL LOW (ref 36.0–46.0)
Hemoglobin: 10 g/dL — ABNORMAL LOW (ref 12.0–15.0)
Hemoglobin: 10 g/dL — ABNORMAL LOW (ref 12.0–15.0)
Hemoglobin: 11.7 g/dL — ABNORMAL LOW (ref 12.0–15.0)

## 2010-10-02 LAB — CBC
HCT: 28.1 % — ABNORMAL LOW (ref 36.0–46.0)
HCT: 29 % — ABNORMAL LOW (ref 36.0–46.0)
HCT: 30.1 % — ABNORMAL LOW (ref 36.0–46.0)
HCT: 25.8 % — ABNORMAL LOW (ref 36.0–46.0)
HCT: 26.4 % — ABNORMAL LOW (ref 36.0–46.0)
Hemoglobin: 10.3 g/dL — ABNORMAL LOW (ref 12.0–15.0)
Hemoglobin: 9.2 g/dL — ABNORMAL LOW (ref 12.0–15.0)
Hemoglobin: 11.4 g/dL — ABNORMAL LOW (ref 12.0–15.0)
Hemoglobin: 11.7 g/dL — ABNORMAL LOW (ref 12.0–15.0)
Hemoglobin: 8.9 g/dL — ABNORMAL LOW (ref 12.0–15.0)
Hemoglobin: 9.5 g/dL — ABNORMAL LOW (ref 12.0–15.0)
MCHC: 34.1 g/dL (ref 30.0–36.0)
MCHC: 34.1 g/dL (ref 30.0–36.0)
MCHC: 33.3 g/dL (ref 30.0–36.0)
MCHC: 34 g/dL (ref 30.0–36.0)
MCHC: 34.4 g/dL (ref 30.0–36.0)
MCV: 107.5 fL — ABNORMAL HIGH (ref 78.0–100.0)
MCV: 108 fL — ABNORMAL HIGH (ref 78.0–100.0)
MCV: 105.2 fL — ABNORMAL HIGH (ref 78.0–100.0)
MCV: 106.6 fL — ABNORMAL HIGH (ref 78.0–100.0)
MCV: 107.4 fL — ABNORMAL HIGH (ref 78.0–100.0)
MCV: 108.5 fL — ABNORMAL HIGH (ref 78.0–100.0)
Platelets: 175 10*3/uL (ref 150–400)
Platelets: 212 10*3/uL (ref 150–400)
Platelets: 108 10*3/uL — ABNORMAL LOW (ref 150–400)
RBC: 2.59 MIL/uL — ABNORMAL LOW (ref 3.87–5.11)
RBC: 2.7 MIL/uL — ABNORMAL LOW (ref 3.87–5.11)
RBC: 2.79 MIL/uL — ABNORMAL LOW (ref 3.87–5.11)
RBC: 2.45 MIL/uL — ABNORMAL LOW (ref 3.87–5.11)
RBC: 2.62 MIL/uL — ABNORMAL LOW (ref 3.87–5.11)
RBC: 3.18 MIL/uL — ABNORMAL LOW (ref 3.87–5.11)
RDW: 17.7 % — ABNORMAL HIGH (ref 11.5–15.5)
RDW: 18.2 % — ABNORMAL HIGH (ref 11.5–15.5)
WBC: 4.8 10*3/uL (ref 4.0–10.5)
WBC: 5.4 10*3/uL (ref 4.0–10.5)
WBC: 6.4 10*3/uL (ref 4.0–10.5)
WBC: 10.8 10*3/uL — ABNORMAL HIGH (ref 4.0–10.5)

## 2010-10-02 LAB — MAGNESIUM: Magnesium: 2.7 mg/dL — ABNORMAL HIGH (ref 1.5–2.5)

## 2010-10-02 LAB — CULTURE, BLOOD (ROUTINE X 2)

## 2010-10-02 LAB — CARDIAC PANEL(CRET KIN+CKTOT+MB+TROPI)
CK, MB: 2.4 ng/mL (ref 0.3–4.0)
CK, MB: 2.6 ng/mL (ref 0.3–4.0)
Total CK: 33 U/L (ref 7–177)
Total CK: 41 U/L (ref 7–177)
Total CK: 58 U/L (ref 7–177)
Troponin I: 0.04 ng/mL (ref 0.00–0.06)

## 2010-10-02 LAB — POCT I-STAT, CHEM 8
Calcium, Ion: 1.27 mmol/L (ref 1.12–1.32)
Creatinine, Ser: 7.9 mg/dL — ABNORMAL HIGH (ref 0.4–1.2)
Glucose, Bld: 88 mg/dL (ref 70–99)
Hemoglobin: 10.5 g/dL — ABNORMAL LOW (ref 12.0–15.0)
Potassium: 5 mEq/L (ref 3.5–5.1)
TCO2: 28 mmol/L (ref 0–100)

## 2010-10-02 LAB — RETICULOCYTES: Retic Ct Pct: 2.8 % (ref 0.4–3.1)

## 2010-10-02 LAB — STOOL CULTURE

## 2010-10-02 LAB — GLUCOSE, CAPILLARY: Glucose-Capillary: 84 mg/dL (ref 70–99)

## 2010-10-02 LAB — CORTISOL: Cortisol, Plasma: 9.9 ug/dL

## 2010-10-02 LAB — HEMOCCULT GUIAC POC 1CARD (OFFICE): Fecal Occult Bld: POSITIVE

## 2010-10-02 LAB — IRON AND TIBC: TIBC: 239 ug/dL — ABNORMAL LOW (ref 250–470)

## 2010-10-02 LAB — FERRITIN: Ferritin: 389 ng/mL — ABNORMAL HIGH (ref 10–291)

## 2010-10-02 LAB — FOLATE: Folate: >20 ng/mL

## 2010-10-02 LAB — TSH: TSH: 1.018 u[IU]/mL (ref 0.350–4.500)

## 2010-10-02 LAB — SAMPLE TO BLOOD BANK

## 2010-10-02 LAB — PHOSPHORUS: Phosphorus: 7.3 mg/dL — ABNORMAL HIGH (ref 2.3–4.6)

## 2010-10-02 LAB — SEDIMENTATION RATE: Sed Rate: 61 mm/hr — ABNORMAL HIGH (ref 0–22)

## 2010-10-02 LAB — ACTH STIMULATION, 3 TIME POINTS: Cortisol, Base: 7.9 ug/dL

## 2010-10-03 LAB — CBC
HCT: 28.6 % — ABNORMAL LOW (ref 36.0–46.0)
HCT: 32.1 % — ABNORMAL LOW (ref 36.0–46.0)
Hemoglobin: 10.2 g/dL — ABNORMAL LOW (ref 12.0–15.0)
Hemoglobin: 10.9 g/dL — ABNORMAL LOW (ref 12.0–15.0)
MCHC: 33.7 g/dL (ref 30.0–36.0)
MCHC: 34 g/dL (ref 30.0–36.0)
MCHC: 34.6 g/dL (ref 30.0–36.0)
MCV: 106.1 fL — ABNORMAL HIGH (ref 78.0–100.0)
MCV: 106.2 fL — ABNORMAL HIGH (ref 78.0–100.0)
MCV: 107.7 fL — ABNORMAL HIGH (ref 78.0–100.0)
Platelets: 114 10*3/uL — ABNORMAL LOW (ref 150–400)
RBC: 3.01 MIL/uL — ABNORMAL LOW (ref 3.87–5.11)
RBC: 3.14 MIL/uL — ABNORMAL LOW (ref 3.87–5.11)
RDW: 17.3 % — ABNORMAL HIGH (ref 11.5–15.5)
RDW: 17.5 % — ABNORMAL HIGH (ref 11.5–15.5)
RDW: 17.7 % — ABNORMAL HIGH (ref 11.5–15.5)

## 2010-10-03 LAB — POCT I-STAT 4, (NA,K, GLUC, HGB,HCT)
Glucose, Bld: 95 mg/dL (ref 70–99)
HCT: 36 % (ref 36.0–46.0)
Potassium: 3.6 mEq/L (ref 3.5–5.1)
Sodium: 136 mEq/L (ref 135–145)

## 2010-10-03 LAB — RENAL FUNCTION PANEL
CO2: 20 mEq/L (ref 19–32)
Calcium: 9.3 mg/dL (ref 8.4–10.5)
Chloride: 100 mEq/L (ref 96–112)
Creatinine, Ser: 7.79 mg/dL — ABNORMAL HIGH (ref 0.4–1.2)
GFR calc Af Amer: 5 mL/min — ABNORMAL LOW (ref >60)
GFR calc non Af Amer: 4 mL/min — ABNORMAL LOW (ref >60)
Glucose, Bld: 93 mg/dL (ref 70–99)
Phosphorus: 7.3 mg/dL — ABNORMAL HIGH (ref 2.3–4.6)
Potassium: 7 mEq/L (ref 3.5–5.1)
Sodium: 130 mEq/L — ABNORMAL LOW (ref 135–145)
Sodium: 133 mEq/L — ABNORMAL LOW (ref 135–145)

## 2010-10-03 LAB — POCT I-STAT, CHEM 8
Creatinine, Ser: 11 mg/dL — ABNORMAL HIGH (ref 0.4–1.2)
Hemoglobin: 12.6 g/dL (ref 12.0–15.0)
Sodium: 132 mEq/L — ABNORMAL LOW (ref 135–145)
TCO2: 23 mmol/L (ref 0–100)

## 2010-10-03 LAB — COMPREHENSIVE METABOLIC PANEL
Albumin: 3.3 g/dL — ABNORMAL LOW (ref 3.5–5.2)
BUN: 46 mg/dL — ABNORMAL HIGH (ref 6–23)
Calcium: 9.4 mg/dL (ref 8.4–10.5)
Creatinine, Ser: 7.15 mg/dL — ABNORMAL HIGH (ref 0.4–1.2)
Potassium: 5 mEq/L (ref 3.5–5.1)
Total Protein: 6.5 g/dL (ref 6.0–8.3)

## 2010-10-03 LAB — BASIC METABOLIC PANEL
BUN: 85 mg/dL — ABNORMAL HIGH (ref 6–23)
CO2: 28 mEq/L (ref 19–32)
Chloride: 100 mEq/L (ref 96–112)
GFR calc Af Amer: 5 mL/min — ABNORMAL LOW (ref >60)
Glucose, Bld: 86 mg/dL (ref 70–99)
Potassium: 5.7 mEq/L — ABNORMAL HIGH (ref 3.5–5.1)
Potassium: 7.3 mEq/L (ref 3.5–5.1)
Sodium: 134 mEq/L — ABNORMAL LOW (ref 135–145)

## 2010-10-03 LAB — BRAIN NATRIURETIC PEPTIDE: Pro B Natriuretic peptide (BNP): 155 pg/mL — ABNORMAL HIGH (ref 0.0–100.0)

## 2010-10-03 LAB — LIPID PANEL
Triglycerides: 152 mg/dL — ABNORMAL HIGH (ref <150)
VLDL: 30 mg/dL (ref 0–40)

## 2010-10-03 LAB — GLUCOSE, CAPILLARY

## 2010-10-03 LAB — T4, FREE: Free T4: 0.98 ng/dL (ref 0.80–1.80)

## 2010-10-03 LAB — POCT CARDIAC MARKERS

## 2010-10-04 LAB — CARDIAC PANEL(CRET KIN+CKTOT+MB+TROPI)
CK, MB: 2.1 ng/mL (ref 0.3–4.0)
Total CK: 77 U/L (ref 7–177)
Troponin I: 0.03 ng/mL (ref 0.00–0.06)

## 2010-10-04 LAB — BASIC METABOLIC PANEL
CO2: 22 mEq/L (ref 19–32)
CO2: 31 mEq/L (ref 19–32)
Calcium: 9.1 mg/dL (ref 8.4–10.5)
Calcium: 9.6 mg/dL (ref 8.4–10.5)
Chloride: 104 mEq/L (ref 96–112)
Chloride: 97 mEq/L (ref 96–112)
Creatinine, Ser: 6.54 mg/dL — ABNORMAL HIGH (ref 0.4–1.2)
Creatinine, Ser: 4.03 mg/dL — ABNORMAL HIGH (ref 0.4–1.2)
GFR calc Af Amer: 10 mL/min — ABNORMAL LOW (ref >60)
GFR calc Af Amer: 14 mL/min — ABNORMAL LOW (ref >60)
GFR calc non Af Amer: 12 mL/min — ABNORMAL LOW (ref >60)
Glucose, Bld: 101 mg/dL — ABNORMAL HIGH (ref 70–99)
Potassium: 4.7 mEq/L (ref 3.5–5.1)
Sodium: 137 mEq/L (ref 135–145)
Sodium: 139 mEq/L (ref 135–145)
Sodium: 138 mEq/L (ref 135–145)

## 2010-10-04 LAB — CBC
HCT: 33.6 % — ABNORMAL LOW (ref 36.0–46.0)
Hemoglobin: 10.1 g/dL — ABNORMAL LOW (ref 12.0–15.0)
Hemoglobin: 11.6 g/dL — ABNORMAL LOW (ref 12.0–15.0)
Hemoglobin: 11.3 g/dL — ABNORMAL LOW (ref 12.0–15.0)
MCHC: 32.8 g/dL (ref 30.0–36.0)
MCHC: 32.9 g/dL (ref 30.0–36.0)
MCV: 108.1 fL — ABNORMAL HIGH (ref 78.0–100.0)
MCV: 107.6 fL — ABNORMAL HIGH (ref 78.0–100.0)
Platelets: 195 10*3/uL (ref 150–400)
RBC: 2.85 MIL/uL — ABNORMAL LOW (ref 3.87–5.11)
RBC: 3.27 MIL/uL — ABNORMAL LOW (ref 3.87–5.11)
RBC: 3.11 MIL/uL — ABNORMAL LOW (ref 3.87–5.11)
RDW: 17.4 % — ABNORMAL HIGH (ref 11.5–15.5)
WBC: 5.5 10*3/uL (ref 4.0–10.5)
WBC: 4.6 10*3/uL (ref 4.0–10.5)
WBC: 5.7 10*3/uL (ref 4.0–10.5)

## 2010-10-04 LAB — DIFFERENTIAL
Basophils Absolute: 0 10*3/uL (ref 0.0–0.1)
Basophils Relative: 0 % (ref 0–1)
Basophils Relative: 0 % (ref 0–1)
Eosinophils Absolute: 0.1 10*3/uL (ref 0.0–0.7)
Eosinophils Absolute: 0.3 10*3/uL (ref 0.0–0.7)
Eosinophils Relative: 3 % (ref 0–5)
Eosinophils Relative: 5 % (ref 0–5)
Lymphocytes Relative: 30 % (ref 12–46)
Lymphocytes Relative: 29 % (ref 12–46)
Lymphs Abs: 1.7 10*3/uL (ref 0.7–4.0)
Monocytes Absolute: 0.4 10*3/uL (ref 0.1–1.0)
Monocytes Absolute: 0.3 10*3/uL (ref 0.1–1.0)
Monocytes Relative: 5 % (ref 3–12)
Neutrophils Relative %: 60 % (ref 43–77)
Neutrophils Relative %: 61 % (ref 43–77)

## 2010-10-04 LAB — CULTURE, BLOOD (ROUTINE X 2): Culture: NO GROWTH

## 2010-10-04 LAB — GLUCOSE, CAPILLARY: Glucose-Capillary: 83 mg/dL (ref 70–99)

## 2010-10-04 LAB — POCT CARDIAC MARKERS
CKMB, poc: 1.1 ng/mL (ref 1.0–8.0)
Myoglobin, poc: 236 ng/mL (ref 12–200)

## 2010-10-04 LAB — HEMOCCULT GUIAC POC 1CARD (OFFICE): Fecal Occult Bld: POSITIVE

## 2010-10-07 IMAGING — XA IR FLUORO GUIDE CV LINE*R*
1 series · 4 of 4 positions shown · non-contrast
Comparison: none

CLINICAL DATA: End-stage renal disease, chronic hemodialysis,
retracted right femoral hemodialysis catheter with an exposed cuff

[Series 1: run · 4 of 4 slices shown]
[im 1/4]
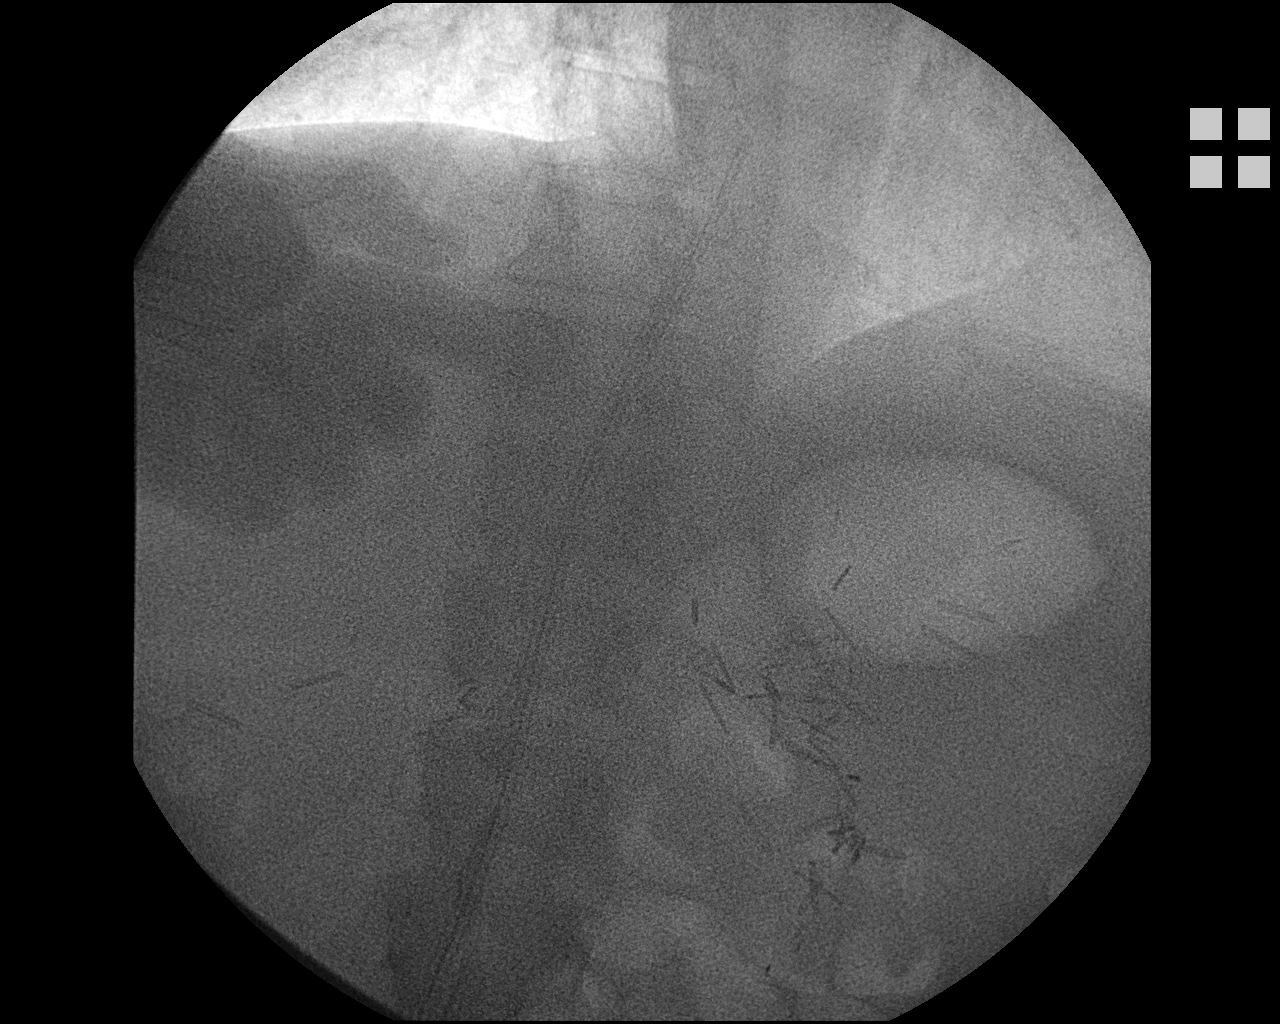
[im 2/4]
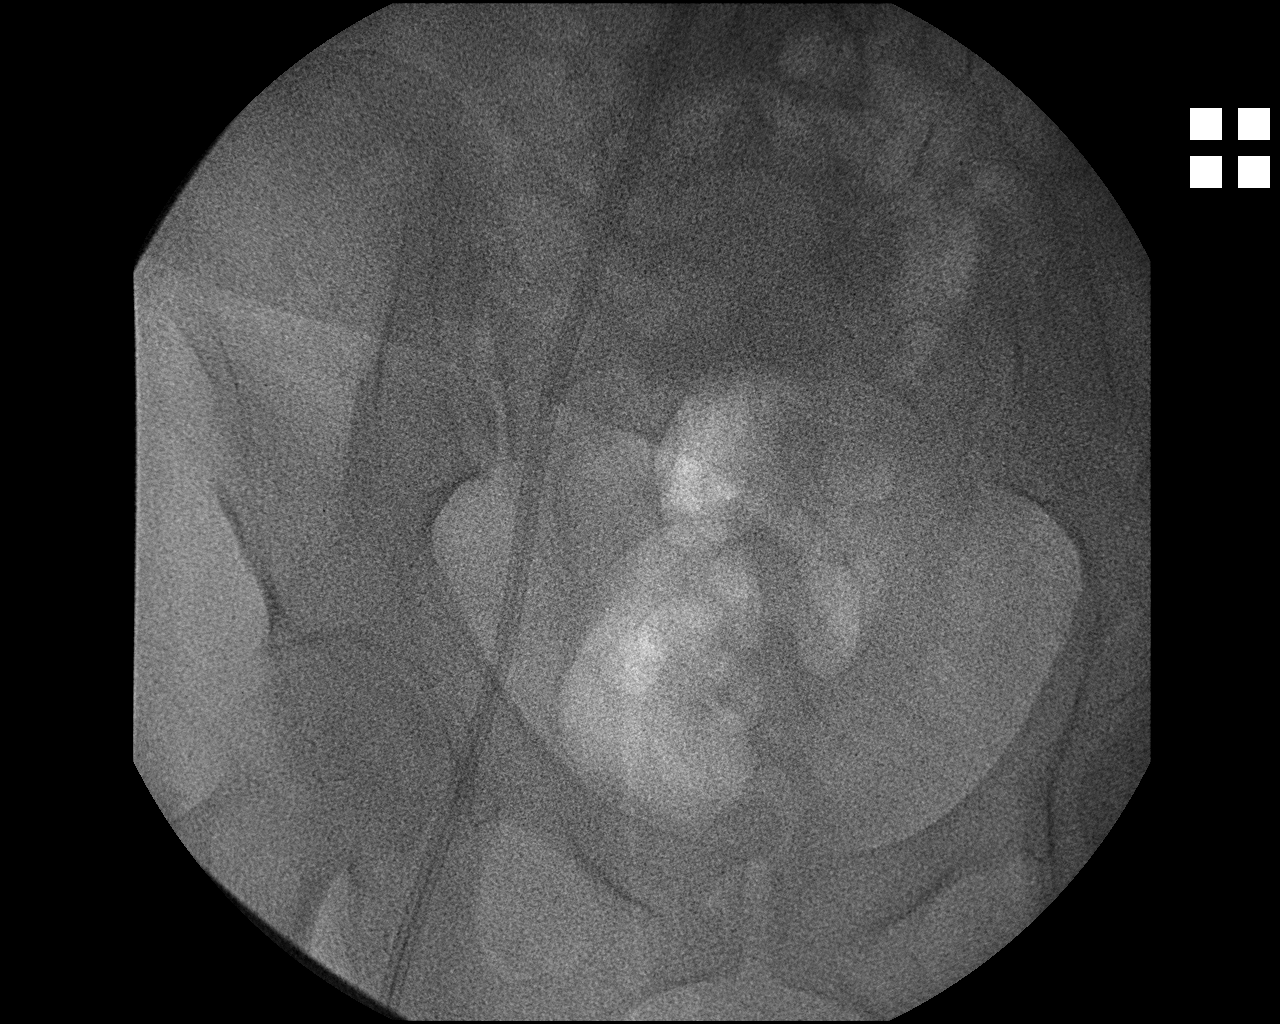
[im 3/4]
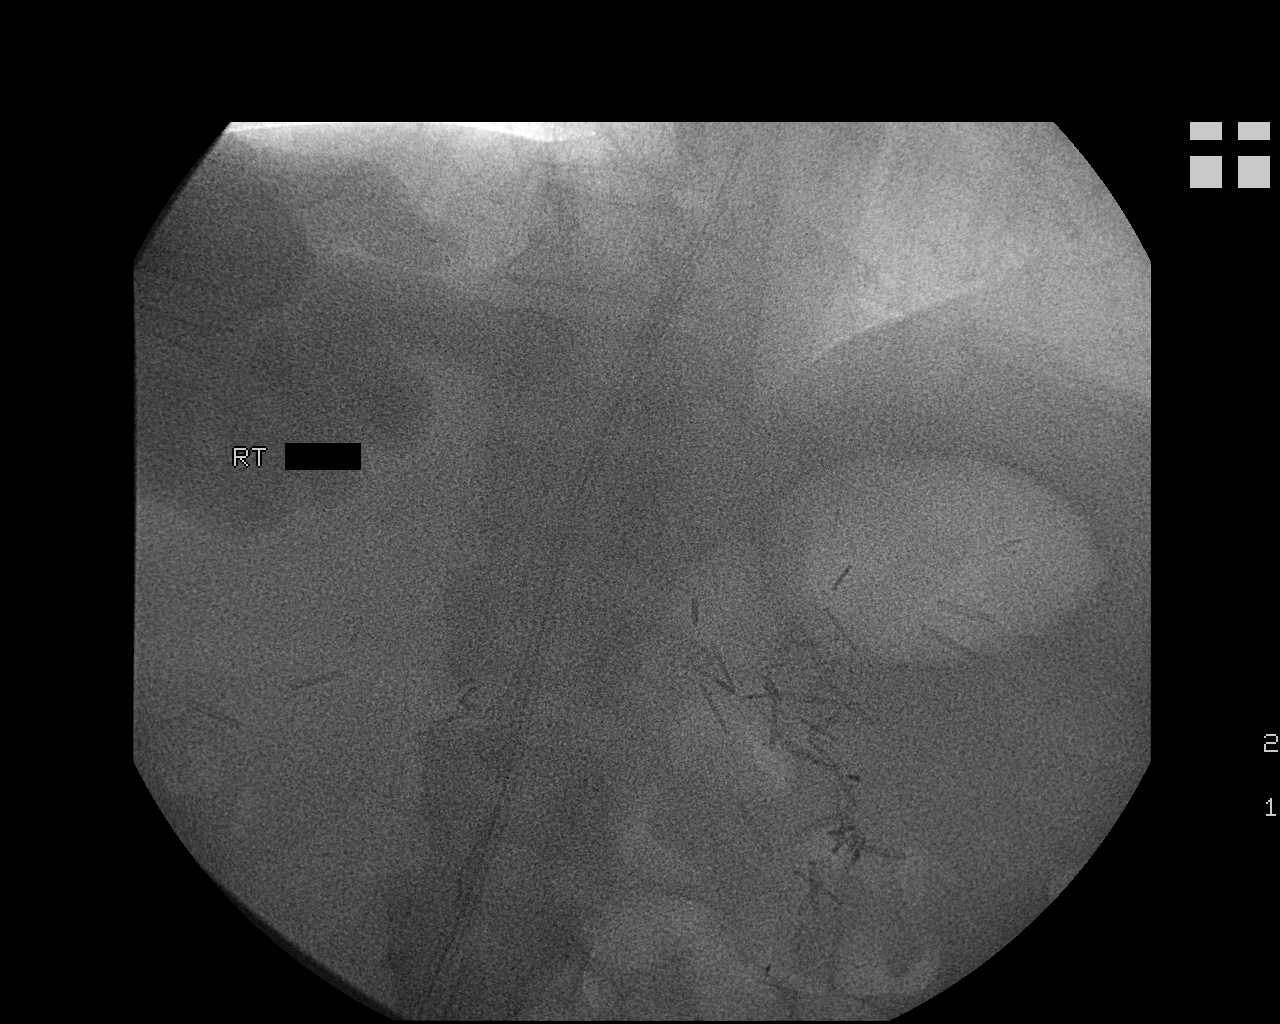
[im 4/4]
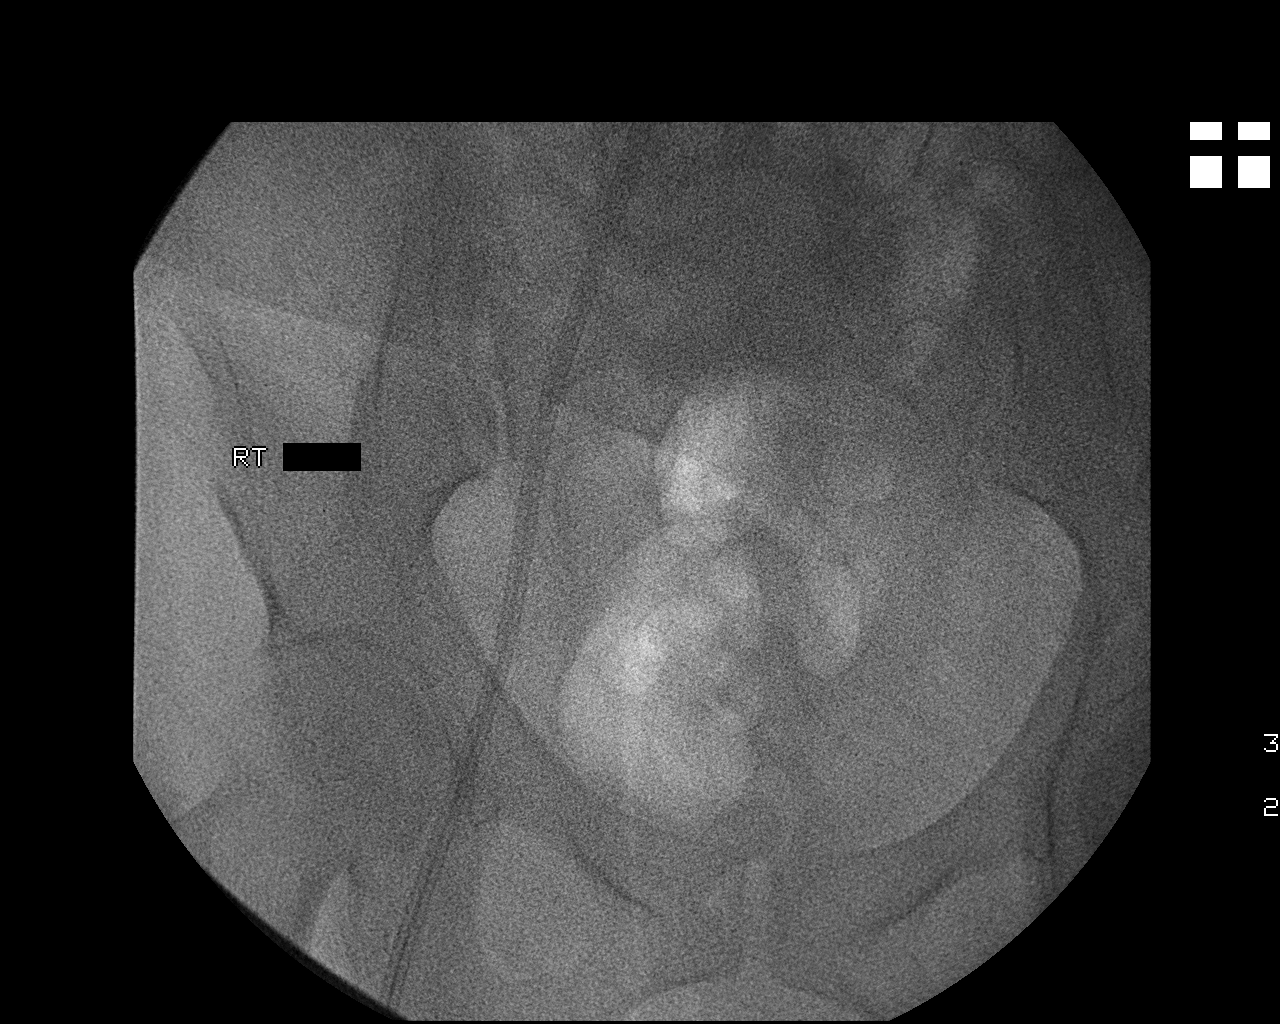

[4 of 4 positions shown; findings below may reference images not displayed]

FLUOROSCOPIC EXCHANGE OF THE RIGHT FEMORAL TUNNELED DIALYSIS
CATHETER

Date:  10/06/2008 [DATE]

Radiologist:  Olivier Johansen, M.D.

Medications:  1 gram ancef administered within 1 hour of the
procedure.

Guidance:  Fluoroscopic

Fluoroscopy time:  0.3 minutes

Sedation time:  None.

Contrast volume:  None.

Complications:  No immediate

PROCEDURE/FINDINGS:

Informed consent was obtained from the patient following
explanation of the procedure, risks, benefits and alternatives.
The patient understands, agrees and consents for the procedure.
All questions were addressed.  A time out was performed.

Under sterile conditions and local anesthesia, the existing
retracted right femoral tunneled dialysis catheter was cut and
removed over an Amplatz guide wire.  A new 55 cm tunneled catheter
was advanced over the guide wire with the tips positioned in the
IVC RA junction.  Position confirmed with fluoroscopy.  Both lumens
aspirated blood very well followed by saline flushes.  Appropriate
volume and strength of heparin was instilled in each lumen followed
by sterile caps. Catheter was secured with a Prolene suture.
Sterile dressing applied.
IMPRESSION: Fluoroscopic exchange of a right femoral tunneled dialysis catheter

Catheter tips IVC RA junction ready for use.

## 2010-10-08 LAB — CBC
HCT: 28.2 % — ABNORMAL LOW (ref 36.0–46.0)
Hemoglobin: 10.3 g/dL — ABNORMAL LOW (ref 12.0–15.0)
MCHC: 32.8 g/dL (ref 30.0–36.0)
MCHC: 33.3 g/dL (ref 30.0–36.0)
MCHC: 33.6 g/dL (ref 30.0–36.0)
MCV: 106.9 fL — ABNORMAL HIGH (ref 78.0–100.0)
MCV: 106.9 fL — ABNORMAL HIGH (ref 78.0–100.0)
MCV: 107.1 fL — ABNORMAL HIGH (ref 78.0–100.0)
MCV: 107.5 fL — ABNORMAL HIGH (ref 78.0–100.0)
MCV: 107.8 fL — ABNORMAL HIGH (ref 78.0–100.0)
Platelets: 132 10*3/uL — ABNORMAL LOW (ref 150–400)
Platelets: 136 10*3/uL — ABNORMAL LOW (ref 150–400)
RBC: 2.64 MIL/uL — ABNORMAL LOW (ref 3.87–5.11)
RBC: 2.8 MIL/uL — ABNORMAL LOW (ref 3.87–5.11)
RBC: 3.02 MIL/uL — ABNORMAL LOW (ref 3.87–5.11)
RDW: 16.6 % — ABNORMAL HIGH (ref 11.5–15.5)
RDW: 17.1 % — ABNORMAL HIGH (ref 11.5–15.5)
RDW: 17.4 % — ABNORMAL HIGH (ref 11.5–15.5)
WBC: 16.1 10*3/uL — ABNORMAL HIGH (ref 4.0–10.5)
WBC: 4.4 10*3/uL (ref 4.0–10.5)
WBC: 5.6 10*3/uL (ref 4.0–10.5)

## 2010-10-08 LAB — CULTURE, BLOOD (ROUTINE X 2)

## 2010-10-08 LAB — GLUCOSE, CAPILLARY
Glucose-Capillary: 105 mg/dL — ABNORMAL HIGH (ref 70–99)
Glucose-Capillary: 106 mg/dL — ABNORMAL HIGH (ref 70–99)
Glucose-Capillary: 108 mg/dL — ABNORMAL HIGH (ref 70–99)
Glucose-Capillary: 108 mg/dL — ABNORMAL HIGH (ref 70–99)
Glucose-Capillary: 112 mg/dL — ABNORMAL HIGH (ref 70–99)
Glucose-Capillary: 113 mg/dL — ABNORMAL HIGH (ref 70–99)
Glucose-Capillary: 133 mg/dL — ABNORMAL HIGH (ref 70–99)
Glucose-Capillary: 136 mg/dL — ABNORMAL HIGH (ref 70–99)
Glucose-Capillary: 148 mg/dL — ABNORMAL HIGH (ref 70–99)
Glucose-Capillary: 150 mg/dL — ABNORMAL HIGH (ref 70–99)
Glucose-Capillary: 191 mg/dL — ABNORMAL HIGH (ref 70–99)
Glucose-Capillary: 193 mg/dL — ABNORMAL HIGH (ref 70–99)
Glucose-Capillary: 82 mg/dL (ref 70–99)
Glucose-Capillary: 93 mg/dL (ref 70–99)
Glucose-Capillary: 93 mg/dL (ref 70–99)

## 2010-10-08 LAB — COMPREHENSIVE METABOLIC PANEL
ALT: 19 U/L (ref 0–35)
AST: 16 U/L (ref 0–37)
AST: 22 U/L (ref 0–37)
Albumin: 2.8 g/dL — ABNORMAL LOW (ref 3.5–5.2)
Alkaline Phosphatase: 127 U/L — ABNORMAL HIGH (ref 39–117)
Alkaline Phosphatase: 147 U/L — ABNORMAL HIGH (ref 39–117)
CO2: 18 mEq/L — ABNORMAL LOW (ref 19–32)
Chloride: 100 mEq/L (ref 96–112)
Chloride: 97 mEq/L (ref 96–112)
GFR calc Af Amer: 10 mL/min — ABNORMAL LOW (ref >60)
GFR calc Af Amer: 5 mL/min — ABNORMAL LOW (ref >60)
GFR calc non Af Amer: 4 mL/min — ABNORMAL LOW (ref >60)
Potassium: 4.9 mEq/L (ref 3.5–5.1)
Sodium: 133 mEq/L — ABNORMAL LOW (ref 135–145)
Sodium: 135 mEq/L (ref 135–145)
Total Bilirubin: 0.5 mg/dL (ref 0.3–1.2)
Total Bilirubin: 0.5 mg/dL (ref 0.3–1.2)
Total Protein: 5.9 g/dL — ABNORMAL LOW (ref 6.0–8.3)

## 2010-10-08 LAB — DIFFERENTIAL
Blasts: 0 %
Metamyelocytes Relative: 0 %
Myelocytes: 0 %
nRBC: 0 /100 WBC

## 2010-10-08 LAB — RENAL FUNCTION PANEL
Albumin: 3.2 g/dL — ABNORMAL LOW (ref 3.5–5.2)
BUN: 15 mg/dL (ref 6–23)
BUN: 37 mg/dL — ABNORMAL HIGH (ref 6–23)
BUN: 55 mg/dL — ABNORMAL HIGH (ref 6–23)
CO2: 26 mEq/L (ref 19–32)
CO2: 28 mEq/L (ref 19–32)
Calcium: 9.3 mg/dL (ref 8.4–10.5)
Chloride: 100 mEq/L (ref 96–112)
Chloride: 96 mEq/L (ref 96–112)
Creatinine, Ser: 5.82 mg/dL — ABNORMAL HIGH (ref 0.4–1.2)
Creatinine, Ser: 6.05 mg/dL — ABNORMAL HIGH (ref 0.4–1.2)
Glucose, Bld: 103 mg/dL — ABNORMAL HIGH (ref 70–99)
Glucose, Bld: 141 mg/dL — ABNORMAL HIGH (ref 70–99)
Glucose, Bld: 146 mg/dL — ABNORMAL HIGH (ref 70–99)
Sodium: 136 mEq/L (ref 135–145)

## 2010-10-08 LAB — FERRITIN: Ferritin: 102 ng/mL (ref 10–291)

## 2010-10-08 LAB — BASIC METABOLIC PANEL
BUN: 16 mg/dL (ref 6–23)
CO2: 31 mEq/L (ref 19–32)
Calcium: 8.9 mg/dL (ref 8.4–10.5)
Chloride: 98 mEq/L (ref 96–112)
Creatinine, Ser: 3.6 mg/dL — ABNORMAL HIGH (ref 0.4–1.2)
GFR calc Af Amer: 16 mL/min — ABNORMAL LOW (ref >60)

## 2010-10-08 LAB — PROTIME-INR
Prothrombin Time: 13.2 seconds (ref 11.6–15.2)
Prothrombin Time: 13.8 seconds (ref 11.6–15.2)

## 2010-10-08 LAB — APTT: aPTT: 29 seconds (ref 24–37)

## 2010-10-08 LAB — POCT I-STAT 3, ART BLOOD GAS (G3+)
Bicarbonate: 21.9 mEq/L (ref 20.0–24.0)
TCO2: 23 mmol/L (ref 0–100)
pCO2 arterial: 39.6 mmHg (ref 35.0–45.0)
pH, Arterial: 7.352 (ref 7.350–7.400)

## 2010-10-08 LAB — CROSSMATCH: Donor AG Type: NEGATIVE

## 2010-10-08 LAB — CARDIAC PANEL(CRET KIN+CKTOT+MB+TROPI)
CK, MB: 3.3 ng/mL (ref 0.3–4.0)
Relative Index: INVALID (ref 0.0–2.5)
Total CK: 105 U/L (ref 7–177)
Total CK: 61 U/L (ref 7–177)
Troponin I: 0.05 ng/mL (ref 0.00–0.06)

## 2010-10-08 LAB — POTASSIUM: Potassium: >7.5 mEq/L (ref 3.5–5.1)

## 2010-10-08 LAB — PTH, INTACT AND CALCIUM: Calcium, Total (PTH): 8.6 mg/dL (ref 8.4–10.5)

## 2010-10-08 LAB — IRON AND TIBC
Saturation Ratios: 44 % (ref 20–55)
UIBC: 115 ug/dL

## 2010-10-08 LAB — MRSA PCR SCREENING: MRSA by PCR: NEGATIVE

## 2010-10-10 ENCOUNTER — Telehealth: Payer: Self-pay | Admitting: Internal Medicine

## 2010-10-10 NOTE — Telephone Encounter (Signed)
Pt called and said that she needs Dr Lovell Sheehan, to put in writing, that pt can pay a CNA $10.00, per Dorothea Glassman, for helping pt. Pls call pt if there are any questions.

## 2010-10-10 NOTE — Telephone Encounter (Signed)
Please advise 

## 2010-10-11 NOTE — Telephone Encounter (Signed)
Talked with facility employee and she states [name] is trying to move out and hire a cna to stay with her.  She can not do that per dr Lovell Sheehan

## 2010-10-11 NOTE — Telephone Encounter (Signed)
I do not understand this message. Either the center at which she resides of this [facility] should be contacted for more information

## 2010-10-15 IMAGING — XA IR FLUORO GUIDE CV LINE*R*
1 series · 2 of 2 positions shown · non-contrast
Comparison: none

CLINICAL HISTORY: End-stage renal disease and exposed cuff from the
right groin catheter.

[Series 1: run · 2 of 2 slices shown]
[im 1/2]
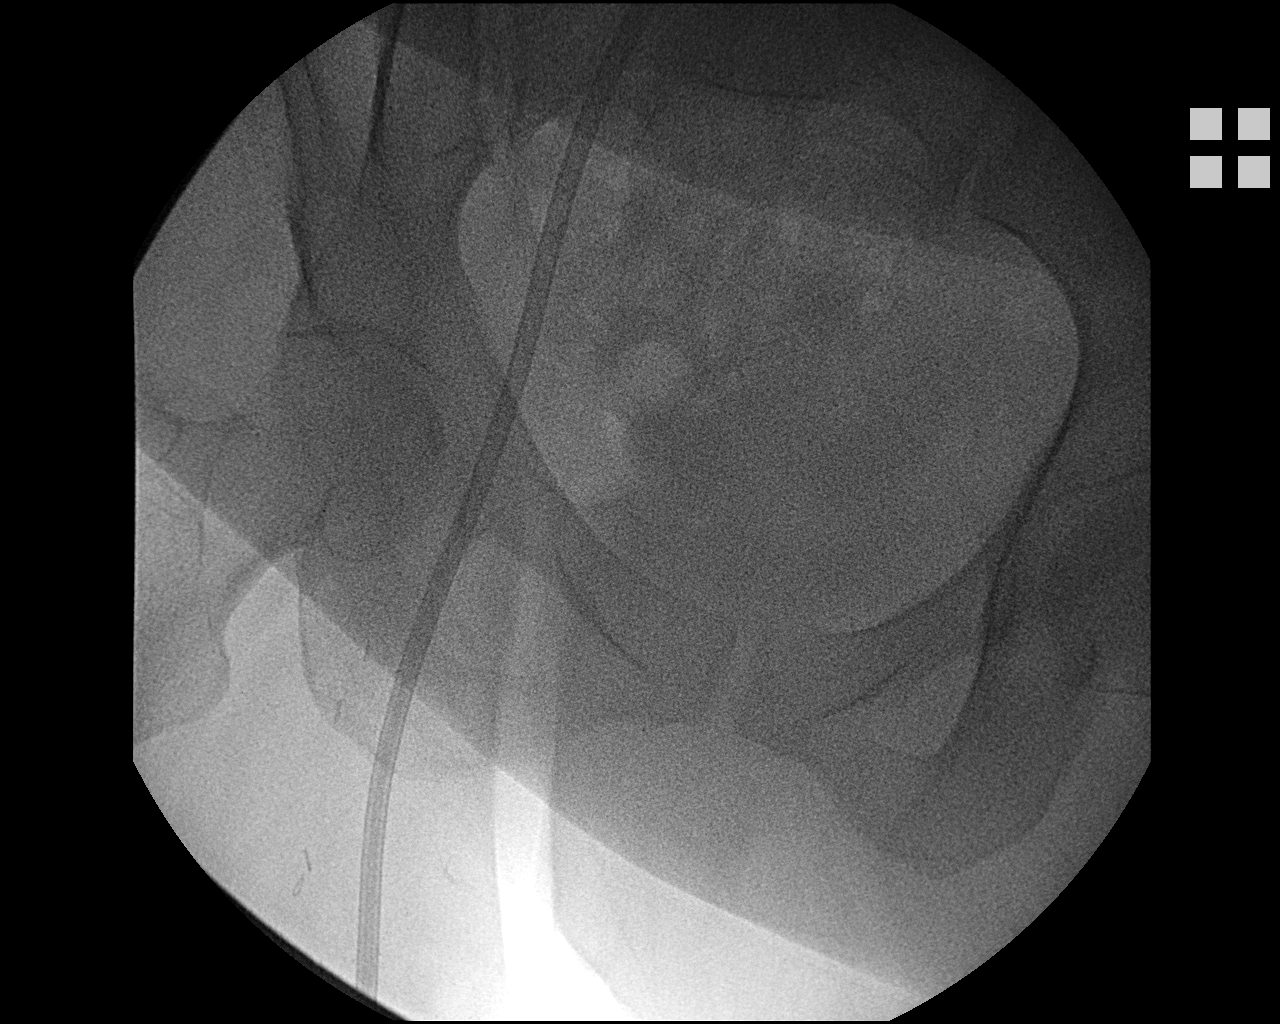
[im 2/2]
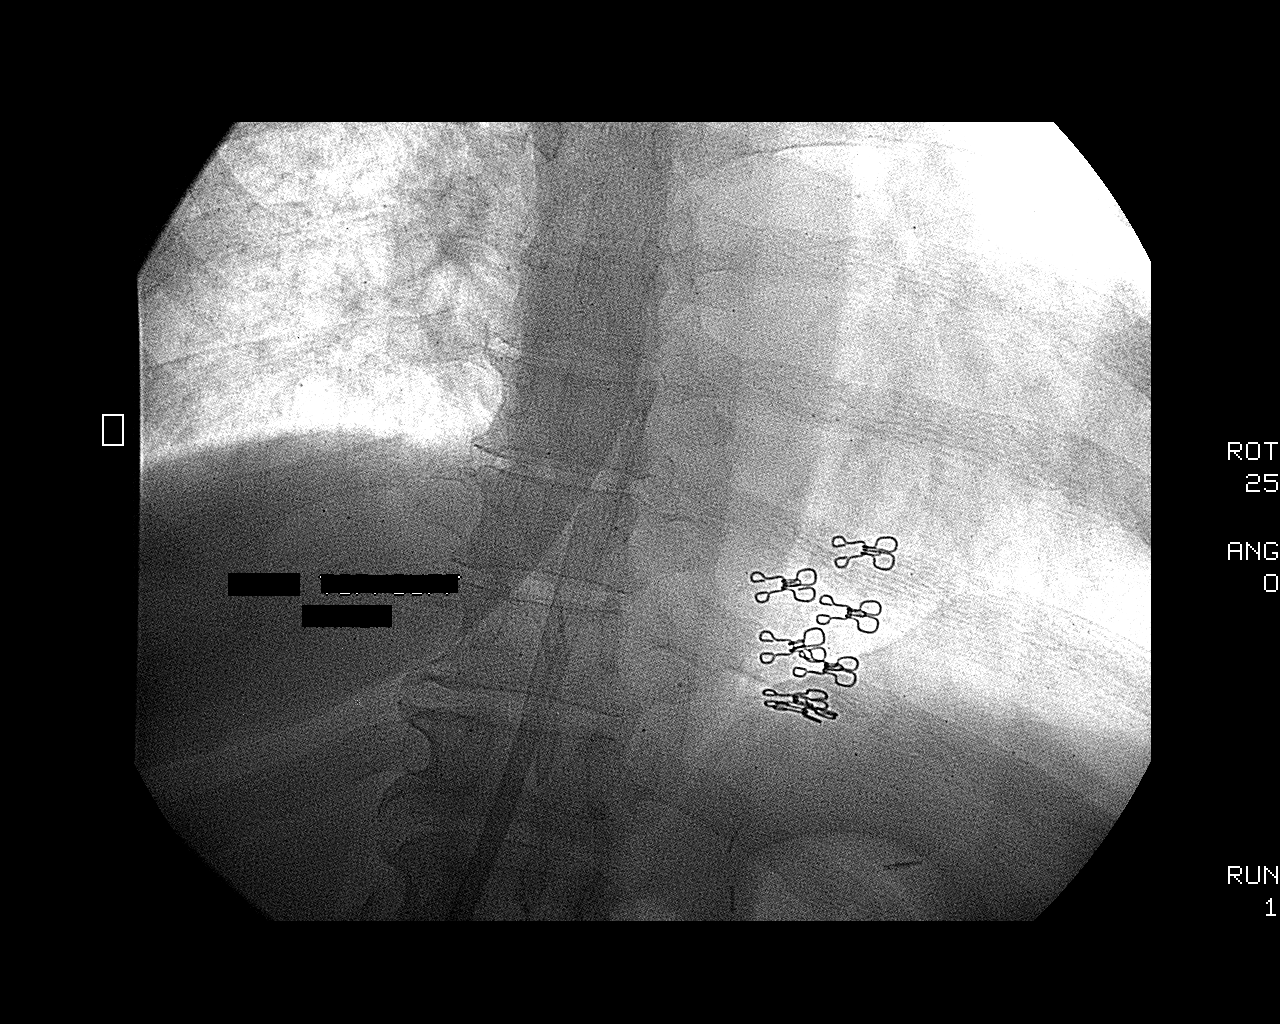

[2 of 2 positions shown; findings below may reference images not displayed]

PROCEDURE(S): EXCHANGE OF THE TUNNELED DIALYSIS CATHETER WITH
FLUOROSCOPY

Medications:Ancef 1 gm, Versed 1 mg, Fentanyl 25 mcg

Sedation time:16 minutes

Fluoroscopy time: 1.1 minutes

Procedure:The procedure was explained to the patient.  The risks
and benefits of the procedure were discussed and the patient's
questions were addressed.  Informed consent was obtained from the
patient. The right groin catheter and surrounding skin were prepped
and draped in a sterile fashion.  Heparin was removed from both
lumens.  The catheter was removed over a stiff Glidewire.  A new 50
cm Arrow catheter was placed over the wire and directed into the
central venous system.  A stiff Amplatz wire was also used to
advance the catheter.  The catheter was flushed with normal saline
and heparin.  The catheter was secured to the skin using suture.
Fluoroscopic images were taken and saved for this procedure.
FINDINGS: Catheter tip is in the right atrium.
IMPRESSION: Successful exchange of the right groin catheter.

## 2010-10-17 LAB — POCT I-STAT 4, (NA,K, GLUC, HGB,HCT)
Glucose, Bld: 98 mg/dL (ref 70–99)
HCT: 42 % (ref 36.0–46.0)

## 2010-10-17 LAB — POTASSIUM: Potassium: 6.6 meq/L (ref 3.5–5.1)

## 2010-10-18 LAB — POTASSIUM: Potassium: 5.8 mEq/L — ABNORMAL HIGH (ref 3.5–5.1)

## 2010-10-19 LAB — CBC
MCHC: 33.3 g/dL (ref 30.0–36.0)
Platelets: 166 10*3/uL (ref 150–400)
RBC: 2.89 MIL/uL — ABNORMAL LOW (ref 3.87–5.11)
RDW: 19.1 % — ABNORMAL HIGH (ref 11.5–15.5)

## 2010-10-19 LAB — BASIC METABOLIC PANEL
CO2: 23 mEq/L (ref 19–32)
Calcium: 9.9 mg/dL (ref 8.4–10.5)
Creatinine, Ser: 9.3 mg/dL — ABNORMAL HIGH (ref 0.4–1.2)
GFR calc Af Amer: 5 mL/min — ABNORMAL LOW (ref >60)

## 2010-10-19 LAB — DIFFERENTIAL
Basophils Absolute: 0 10*3/uL (ref 0.0–0.1)
Basophils Relative: 0 % (ref 0–1)
Monocytes Relative: 5 % (ref 3–12)
Neutro Abs: 3.6 10*3/uL (ref 1.7–7.7)
Neutrophils Relative %: 65 % (ref 43–77)

## 2010-10-19 LAB — POTASSIUM: Potassium: 6.3 mEq/L (ref 3.5–5.1)

## 2010-10-20 LAB — DIFFERENTIAL
Lymphocytes Relative: 25 % (ref 12–46)
Lymphs Abs: 1.5 10*3/uL (ref 0.7–4.0)
Monocytes Relative: 5 % (ref 3–12)
Neutro Abs: 4 10*3/uL (ref 1.7–7.7)
Neutrophils Relative %: 67 % (ref 43–77)

## 2010-10-20 LAB — POCT I-STAT, CHEM 8
Chloride: 101 mEq/L (ref 96–112)
Creatinine, Ser: 4.8 mg/dL — ABNORMAL HIGH (ref 0.4–1.2)
Glucose, Bld: 89 mg/dL (ref 70–99)
Potassium: 4.1 mEq/L (ref 3.5–5.1)
Sodium: 135 mEq/L (ref 135–145)

## 2010-10-20 LAB — CBC
RBC: 3.42 MIL/uL — ABNORMAL LOW (ref 3.87–5.11)
WBC: 5.9 10*3/uL (ref 4.0–10.5)

## 2010-10-22 IMAGING — XA IR FLUORO GUIDE CV LINE*R*
1 series · 1 of 1 positions shown · non-contrast
Comparison: none

CLINICAL DATA: Right femoral tunneled dialysis catheter
malfunction, end-stage renal disease

[Series 1000: run · 0.22mm/px · 1 of 1 slices shown]
[im 1/1]
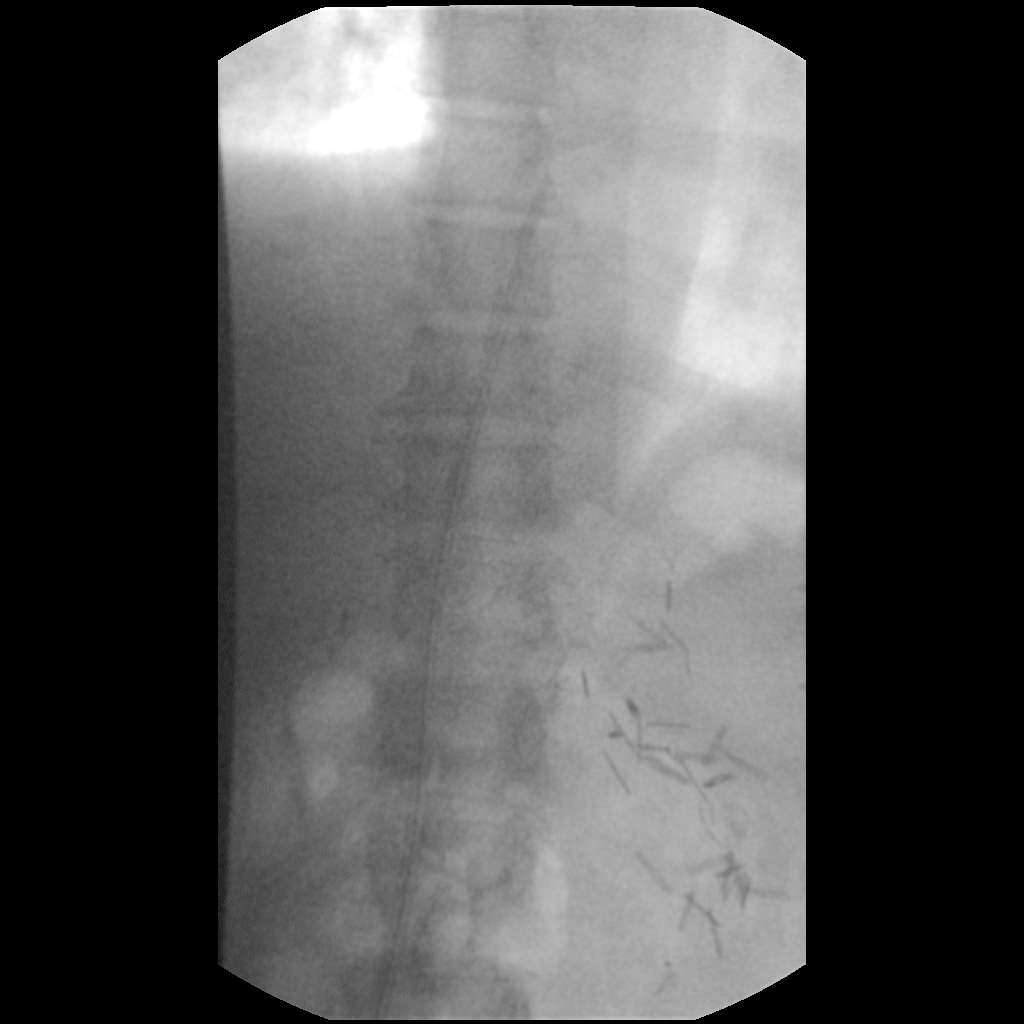

[1 of 1 positions shown; findings below may reference images not displayed]

FLUOROSCOPIC EXCHANGE OF THE RIGHT FEMORAL TUNNELED DIALYSIS
CATHETER

Date:  10/21/2008 [DATE]

Radiologist:  Mok Hindawy, M.D.

Medications:  1 gram ancef intravenous, 1% lidocaine at the access
site

Guidance:  Fluoroscopic

Fluoroscopy time:  0.5 minutes

Sedation time:  None.

Contrast volume:  None.

Complications:  No immediate

PROCEDURE/FINDINGS:

Informed consent was obtained from the patient following
explanation of the procedure, risks, benefits and alternatives.
The patient understands, agrees and consents for the procedure.
All questions were addressed.  A time out was performed.

Under sterile conditions and local anesthesia, the right femoral
tunneled dialysis catheter was removed over a stiff Glidewire.  A
new 50 cm Medcomp 14 French dialysis catheter was advanced with the
tips positioned in the IVC / RA junction.  Position was confirmed
with fluoroscopy.  Blood was aspirated from both lumens very well
followed by saline flushes.  The appropriate volume and strength of
heparin was instilled in both lumens followed by external caps.
Sterile dressing was applied.  Catheter was secured to the skin.
No immediate complication.  The patient tolerated the procedure
well.
IMPRESSION: Fluoroscopic exchange of a right femoral tunneled dialysis
catheter.  Ready for use.

## 2010-10-25 LAB — CBC
HCT: 33 % — ABNORMAL LOW (ref 36.0–46.0)
MCHC: 33.8 g/dL (ref 30.0–36.0)
Platelets: 162 10*3/uL (ref 150–400)
RDW: 19.3 % — ABNORMAL HIGH (ref 11.5–15.5)

## 2010-10-25 LAB — POTASSIUM
Potassium: 5.7 mEq/L — ABNORMAL HIGH (ref 3.5–5.1)
Potassium: 5.9 mEq/L — ABNORMAL HIGH (ref 3.5–5.1)

## 2010-10-25 LAB — POCT I-STAT 4, (NA,K, GLUC, HGB,HCT)
Glucose, Bld: 83 mg/dL (ref 70–99)
HCT: 37 % (ref 36.0–46.0)
Potassium: 4.3 mEq/L (ref 3.5–5.1)

## 2010-10-30 LAB — POCT I-STAT, CHEM 8
Calcium, Ion: 1.19 mmol/L (ref 1.12–1.32)
Chloride: 105 mEq/L (ref 96–112)
Glucose, Bld: 89 mg/dL (ref 70–99)
HCT: 42 % (ref 36.0–46.0)
Hemoglobin: 14.3 g/dL (ref 12.0–15.0)
Potassium: 5.3 mEq/L — ABNORMAL HIGH (ref 3.5–5.1)

## 2010-10-31 LAB — POCT I-STAT 3, ART BLOOD GAS (G3+)
Bicarbonate: 15.8 mEq/L — ABNORMAL LOW (ref 20.0–24.0)
O2 Saturation: 92 %
TCO2: 17 mmol/L (ref 0–100)
pCO2 arterial: 36.9 mmHg (ref 35.0–45.0)
pH, Arterial: 7.24 — ABNORMAL LOW (ref 7.350–7.400)
pO2, Arterial: 75 mmHg — ABNORMAL LOW (ref 80.0–100.0)

## 2010-10-31 LAB — POCT I-STAT, CHEM 8
BUN: 73 mg/dL — ABNORMAL HIGH (ref 6–23)
Chloride: 104 mEq/L (ref 96–112)
Creatinine, Ser: 9.7 mg/dL — ABNORMAL HIGH (ref 0.4–1.2)
Potassium: 5.4 mEq/L — ABNORMAL HIGH (ref 3.5–5.1)
Sodium: 127 mEq/L — ABNORMAL LOW (ref 135–145)

## 2010-10-31 LAB — BASIC METABOLIC PANEL
BUN: 54 mg/dL — ABNORMAL HIGH (ref 6–23)
CO2: 16 mEq/L — ABNORMAL LOW (ref 19–32)
Calcium: 9 mg/dL (ref 8.4–10.5)
Chloride: 98 mEq/L (ref 96–112)
GFR calc Af Amer: 6 mL/min — ABNORMAL LOW (ref >60)
GFR calc non Af Amer: 6 mL/min — ABNORMAL LOW (ref >60)
Glucose, Bld: 66 mg/dL — ABNORMAL LOW (ref 70–99)
Glucose, Bld: 82 mg/dL (ref 70–99)
Potassium: 4.8 mEq/L (ref 3.5–5.1)
Potassium: 5.7 mEq/L — ABNORMAL HIGH (ref 3.5–5.1)
Sodium: 127 mEq/L — ABNORMAL LOW (ref 135–145)
Sodium: 128 mEq/L — ABNORMAL LOW (ref 135–145)

## 2010-10-31 LAB — CBC
HCT: 33.7 % — ABNORMAL LOW (ref 36.0–46.0)
Platelets: 70 10*3/uL — ABNORMAL LOW (ref 150–400)
RDW: 16.1 % — ABNORMAL HIGH (ref 11.5–15.5)
WBC: 3.2 10*3/uL — ABNORMAL LOW (ref 4.0–10.5)

## 2010-10-31 LAB — DIFFERENTIAL
Basophils Absolute: 0 10*3/uL (ref 0.0–0.1)
Eosinophils Relative: 1 % (ref 0–5)
Lymphocytes Relative: 31 % (ref 12–46)
Lymphs Abs: 1 10*3/uL (ref 0.7–4.0)
Neutro Abs: 1.9 10*3/uL (ref 1.7–7.7)
Neutrophils Relative %: 60 % (ref 43–77)

## 2010-11-03 ENCOUNTER — Emergency Department (HOSPITAL_COMMUNITY): Payer: Medicare Other

## 2010-11-03 ENCOUNTER — Emergency Department (HOSPITAL_COMMUNITY)
Admission: EM | Admit: 2010-11-03 | Discharge: 2010-11-03 | Disposition: A | Discharge status: Home / Self Care | Payer: Medicare Other | Attending: Emergency Medicine | Admitting: Emergency Medicine

## 2010-11-03 DIAGNOSIS — F329 Major depressive disorder, single episode, unspecified: Secondary | ICD-10-CM | POA: Insufficient documentation

## 2010-11-03 DIAGNOSIS — Z8659 Personal history of other mental and behavioral disorders: Secondary | ICD-10-CM | POA: Insufficient documentation

## 2010-11-03 DIAGNOSIS — F3289 Other specified depressive episodes: Secondary | ICD-10-CM | POA: Insufficient documentation

## 2010-11-03 DIAGNOSIS — R232 Flushing: Secondary | ICD-10-CM | POA: Insufficient documentation

## 2010-11-03 DIAGNOSIS — M1990 Unspecified osteoarthritis, unspecified site: Secondary | ICD-10-CM | POA: Insufficient documentation

## 2010-11-03 DIAGNOSIS — Q851 Tuberous sclerosis: Secondary | ICD-10-CM | POA: Insufficient documentation

## 2010-11-03 DIAGNOSIS — N186 End stage renal disease: Secondary | ICD-10-CM | POA: Insufficient documentation

## 2010-11-03 DIAGNOSIS — G40909 Epilepsy, unspecified, not intractable, without status epilepticus: Secondary | ICD-10-CM | POA: Insufficient documentation

## 2010-11-03 DIAGNOSIS — F79 Unspecified intellectual disabilities: Secondary | ICD-10-CM | POA: Insufficient documentation

## 2010-11-03 DIAGNOSIS — R5383 Other fatigue: Secondary | ICD-10-CM | POA: Insufficient documentation

## 2010-11-03 DIAGNOSIS — R5381 Other malaise: Secondary | ICD-10-CM | POA: Insufficient documentation

## 2010-11-03 DIAGNOSIS — Z79899 Other long term (current) drug therapy: Secondary | ICD-10-CM | POA: Insufficient documentation

## 2010-11-03 DIAGNOSIS — Z992 Dependence on renal dialysis: Secondary | ICD-10-CM | POA: Insufficient documentation

## 2010-11-03 LAB — BASIC METABOLIC PANEL
BUN: 25 mg/dL — ABNORMAL HIGH (ref 6–23)
CO2: 31 mEq/L (ref 19–32)
Calcium: 9.5 mg/dL (ref 8.4–10.5)
GFR calc non Af Amer: 11 mL/min — ABNORMAL LOW (ref >60)
Glucose, Bld: 102 mg/dL — ABNORMAL HIGH (ref 70–99)

## 2010-11-03 LAB — CBC
HCT: 29.9 % — ABNORMAL LOW (ref 36.0–46.0)
Hemoglobin: 9.5 g/dL — ABNORMAL LOW (ref 12.0–15.0)
MCH: 33.7 pg (ref 26.0–34.0)
MCHC: 31.8 g/dL (ref 30.0–36.0)
MCV: 106 fL — ABNORMAL HIGH (ref 78.0–100.0)
RDW: 14.7 % (ref 11.5–15.5)

## 2010-11-03 LAB — DIFFERENTIAL
Basophils Absolute: 0 10*3/uL (ref 0.0–0.1)
Eosinophils Relative: 3 % (ref 0–5)
Lymphocytes Relative: 25 % (ref 12–46)
Lymphs Abs: 1.4 10*3/uL (ref 0.7–4.0)
Monocytes Absolute: 0.2 10*3/uL (ref 0.1–1.0)
Monocytes Relative: 4 % (ref 3–12)

## 2010-11-03 LAB — POCT CARDIAC MARKERS: CKMB, poc: <1 ng/mL — ABNORMAL LOW (ref 1.0–8.0)

## 2010-11-05 IMAGING — CR DG CHEST 1V PORT
1 series · 1 of 1 positions shown · non-contrast
Comparison: 09/11/2008.

CLINICAL DATA: ESRD.  Hemodialysis catheter placement.

PORTABLE CHEST - 1 VIEW

[view not recorded]
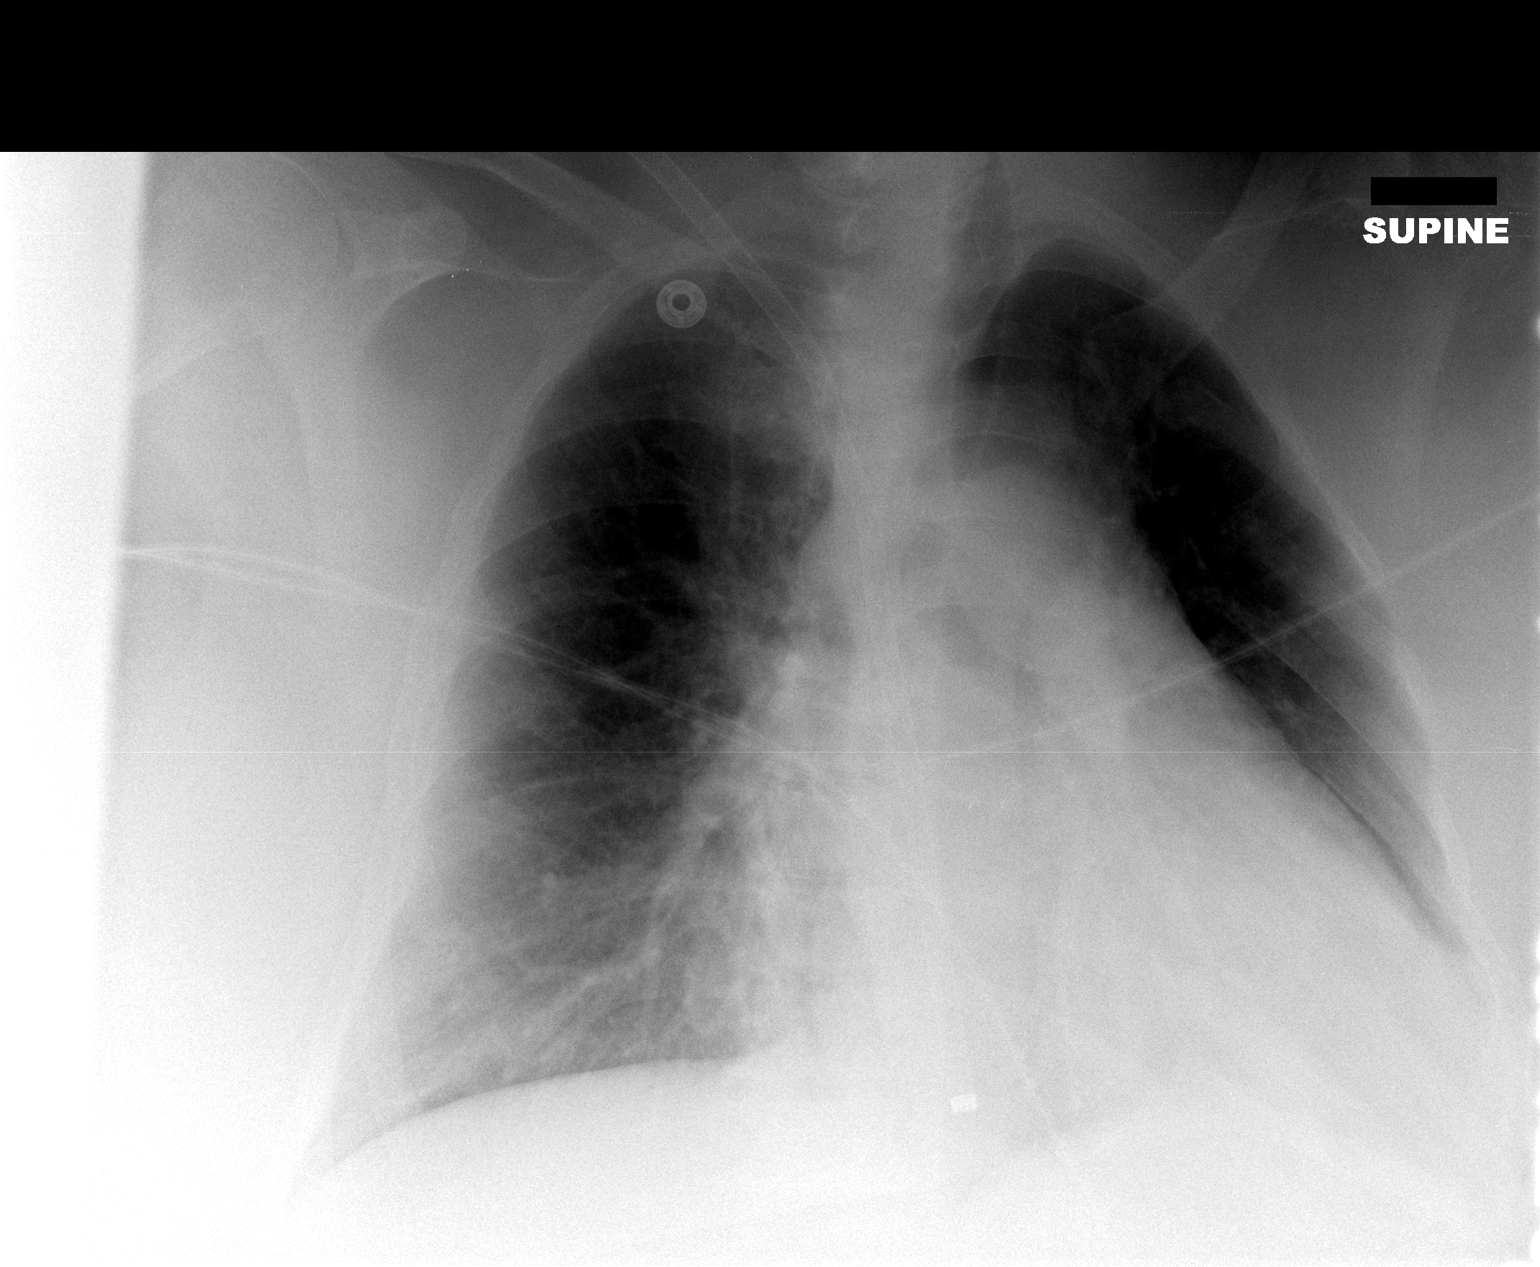

[1 of 1 positions shown; findings below may reference images not displayed]

FINDINGS: Right  jugular central venous catheter is in place with
the tip in the lower right atrium.  There is no pneumothorax.

The heart is enlarged and there is vascular congestion.  There is
no edema or effusion.
IMPRESSION: Right jugular catheter placed with the tip in the lower right
atrium,  no pneumothorax.

Mild fluid overload.

## 2010-11-13 IMAGING — XA IR FLUORO GUIDE CV LINE*R*
1 series · 1 of 1 positions shown · non-contrast
Comparison: none

CLINICAL DATA: Partial retraction of tunneled hemodialysis
catheter.  The cuff is exposed.

[Series 1: run · 1 of 1 slices shown]
[im 1/1]
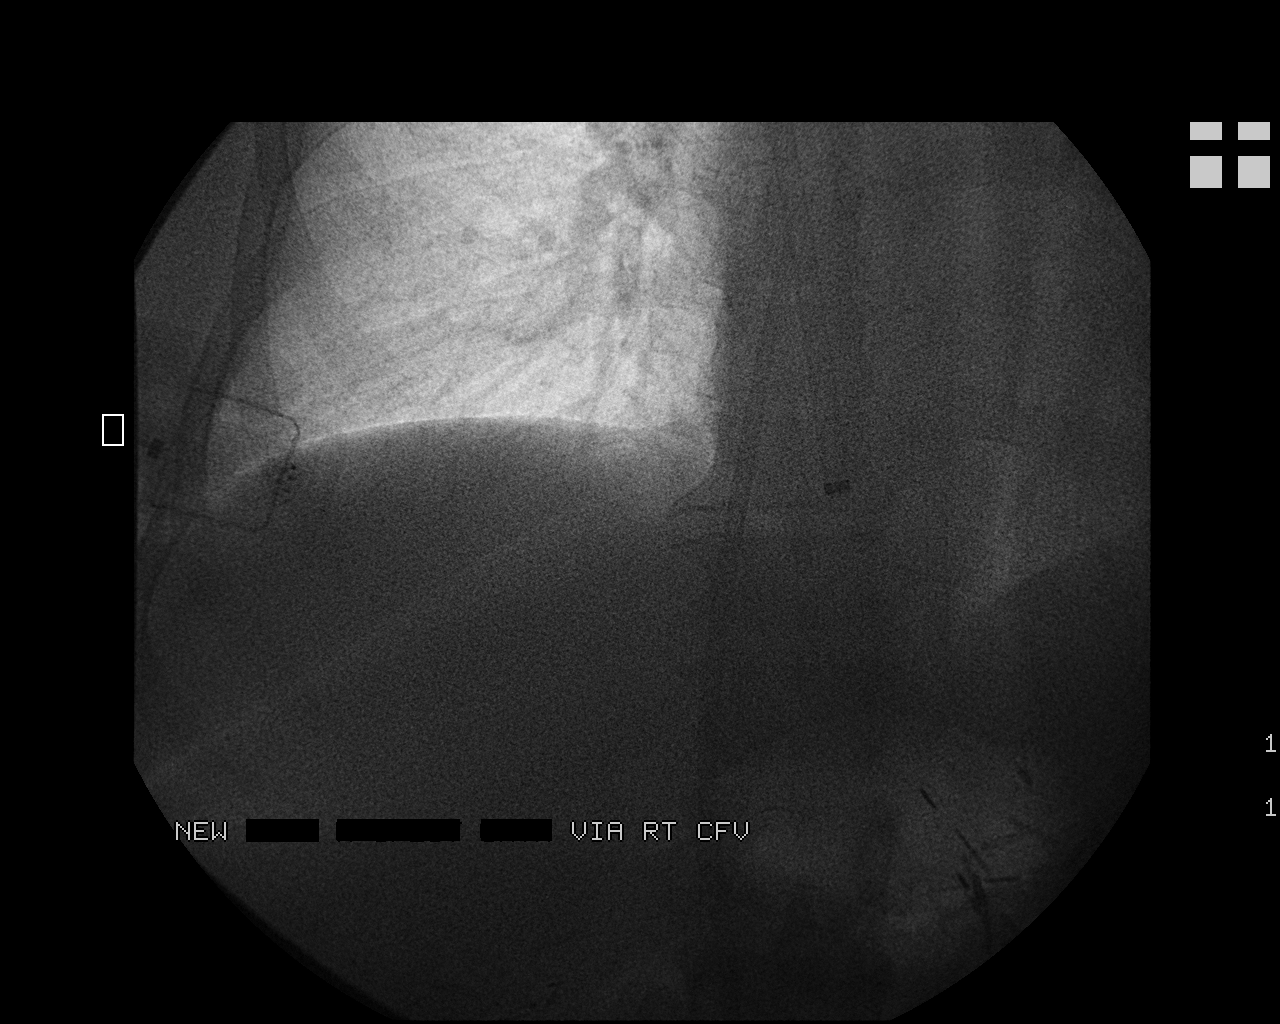

[1 of 1 positions shown; findings below may reference images not displayed]

REVISION OF TUNNELED HEMODIALYSIS CATHETER UNDER FLUOROSCOPY

Technique and findings:  The previously placed right femoral
catheter and surrounding skin were prepped and draped in usual
sterile fashion, infiltrated locally with 1% lidocaine.
The catheter was cut and exchanged over a stiff angled glide wire
for a new 55 cm HemoSplit hemodialysis catheter, positioned with
its tips near the IVC RA junction.  Spot radiograph confirms
appropriate positioning.  The catheter was flushed and secured
externally with 0- Prolene suture.  No immediate complication. The
patient tolerated the procedure well.

IMPRESSION
1.  Technically successful revision of tunneled hemodialysis
catheter.

## 2010-11-23 ENCOUNTER — Encounter: Payer: Self-pay | Admitting: Internal Medicine

## 2010-11-28 ENCOUNTER — Ambulatory Visit (INDEPENDENT_AMBULATORY_CARE_PROVIDER_SITE_OTHER): Payer: Medicare Other | Admitting: Internal Medicine

## 2010-11-28 VITALS — BP 110/70 | HR 76 | Temp 98.6°F | Resp 16 | Ht 67.0 in | Wt 308.0 lb

## 2010-11-28 DIAGNOSIS — G4730 Sleep apnea, unspecified: Secondary | ICD-10-CM

## 2010-11-28 NOTE — Discharge Summary (Signed)
[name], ROUNDY NO.:  [id number]   MEDICAL RECORD NO.:  [id number]          PATIENT TYPE:  INP   LOCATION:  6730                         FACILITY:  [facility]   PHYSICIAN:  Valerie A. Felicity Coyer, MDDATE OF BIRTH:  [birth year]   DATE OF ADMISSION:  03/11/2008  DATE OF DISCHARGE:  03/25/2008                               DISCHARGE SUMMARY   PRIMARY CARE PHYSICIAN:  Stacie Glaze, MD   NEPHROLOGIST:  Duke Salvia. Eliott Nine, MD   DISCHARGE DIAGNOSES:  1. End-stage renal disease, on hemodialysis with hemodialysis      noncompliance.  2. Rectal bleeding thought secondary to internal hemorrhoids.  3. Anemia of chronic disease.  4. Secondary hyperparathyroidism secondary to end-stage renal disease.  5. History of mental retardation with paranoid schizophrenic traits.  6. Prolonged QT syndrome, on Seroquel during this admission.   HISTORY OF PRESENT ILLNESS:  [name] is a 55 year old white female  with past medical history of end-stage renal disease with noncompliance  with hemodialysis, and also with history of mental retardation with  paranoid traits.  The patient presented to [facility] Emergency Room on  day of admission with complaints of 3-day history of worsening shortness  of breath, generalized weakness, and increased lower extremity edema.  The patient noted to be quite confused on exam.  Upon further  investigation, the patient admitted to missing dialysis x1 week, which  she has also done several times in the past.  The patient was admitted  at that time for complications from missing hemodialysis.   PAST MEDICAL HISTORY:  1. End-stage renal disease, on hemodialysis.  2. Med noncompliance.  3. GERD.  4. Mild mental retardation.  5. History of seizure disorder.  6. History of asthma.  7. History of noncompliance with hemodialysis with consequent emergent      medical admission.  8. History of bradycardic arrest secondary to problem above.  9. History  of anemia of chronic disease.  10.History of secondary hyperparathyroidism.  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  12.History of MRSA bacteremia.  13.Seasonal allergies.  14.Depression.   CONSULTATIONS DURING THIS ADMISSION:  1. Nephrology.  2. Gastroenterology.  3. Psychiatry.  4. Clinical social work.   COURSE OF HOSPITALIZATION:  1. End-stage renal disease with noncompliance with hemodialysis.  As      noted in HPI, the patient with confusion secondary to uremia upon      admission.  The patient responded well to scheduled hemodialysis      managed per Renal during this admission.  Due to the patient's      continued history of noncompliance and continued admissions for      complications of missing dialysis, the patient was seen in      consultation by Psychiatry to determine the capacity for medical      decision-making.  Dr. Jeanie Sewer evaluated the patient several times      throughout this hospitalization and felt the patient lacked the      capacity for medical decision-making.  At that time, the plan was      for the patient to be transferred to  skilled nursing facility to      ensure dialysis compliance, however, the patient godmother has      agreed to become the patient's legal guardian and has ensured the      Renal Team at this time the patient will be attending regularly      scheduled dialysis.  The patient is being switched to the [facility], managed by Dr. Camille Bal.  At the time of      discharge, a telephone conference meeting was scheduled for      March 25, 2008 at 11:00 a.m. between the Renal Team and the      patient's legal guardian.  The patient is agreeable to this plan at      the time of discharge.  2. Anxiety with paranoid schizophrenic traits.  Again, Psychiatry has      evaluated the patient several times during this admission.  The      patient's Cymbalta dose has been increased from that of prior to       this admission.  The patient was tried on Seroquel, however, this      medication was discontinued in setting of prolonged QT syndrome.      The patient has remained stable throughout hospitalization and has      been quite cooperative with the medical team.  3. Anemia of chronic disease and with bright red blood per rectum      likely secondary to internal hemorrhoids.  The patient did receive      3 units of packed red blood cells during this hospitalization.  We      will continue Aranesp and Venofer per Renal Team at hemodialysis.      Monitor H&H with next collection at dialysis on day after      admission.  4. Bright red blood per rectum likely secondary to internal      hemorrhoids.  The patient's hospitalization temporarily prolonged      by onset of bright red blood per rectum.  Dr. Matthias Hughs was asked to      see the patient in consultation.  A flexible sigmoidoscopy was      performed on March 22, 2008 revealing small polyp sent for      pathology which is pending at this time.  However, GI felt the      patient's rectal bleeding was likely secondary to periodic      hemorrhoidal enlargement.  The patient was without any recurrent      rectal bleeding at time of dictation.   DISCHARGE MEDICATIONS:  1. Topamax 200 mg p.o. b.i.d.  2. Renagel 800 mg 3 tablets p.o. with each meal.  3. Aspirin 325 mg p.o. daily.  4. Nephro-Vite 1 tablet p.o. daily.  5. Nexium 40 mg p.o. b.i.d.  6. Cymbalta 60 mg p.o. at bedtime and 30 mg p.o. q.a.m.  Note, this is      a new dose.  7. Primidone 250 mg p.o. b.i.d.  8. Advair 250/50 one puff inhaled b.i.d.  9. Allegra 60 mg p.o. daily.  10.Trazodone 25 mg p.o. at bedtime p.r.n.  11.Nasacort nasal spray 1 spray each nostril daily.   PERTINENT DISCHARGE LABORATORY DATA:  White cell count 4.2, platelet  count 127, hemoglobin 8.5, and hematocrit 25.7.  Sodium 131, potassium  3.9, BUN 24, creatinine 4.91, calcium 9.4, phosphorus 2.2, and total   albumin 2.9.   DISPOSITION:  The patient felt medically  stable for discharge home with  her godmother at this time.  Her PhosLo has been discontinued at this  time.  However, given that the patient will resume diet as prior to this  admission at time of discharge, will likely need to begin this  medication in the near future.  The patient is scheduled for  hemodialysis on Tuesdays, Thursdays, and Saturdays at 11:30 at [facility].  Again meeting is scheduled for tomorrow at [facility] at 11:00 a.m. with the patient's godmother/legal guardian  and Renal Team.   Greater than 30 minutes were spent on discharge planning.      Cordelia Pen, NP      Raenette Rover. Felicity Coyer, MD  Electronically Signed    LE/MEDQ  D:  03/24/2008  T:  03/25/2008  Job:  [id number]   cc:   Stacie Glaze, MD  Duke Salvia Eliott Nine, M.D.

## 2010-11-28 NOTE — Consult Note (Signed)
[name], [name]:  [id number]   MEDICAL RECORD NO.:  [id number]          PATIENT TYPE:  INP   LOCATION:  6738                         FACILITY:  [facility]   PHYSICIAN:  Antonietta Breach, M.D.  DATE OF BIRTH:  03-06-1956   DATE OF CONSULTATION:  02/17/2008  DATE OF DISCHARGE:                                 CONSULTATION   REASON FOR CONSULTATION:  Depression.   REQUESTING PHYSICIAN:  Sanjuana Mae, M.D.   HISTORY OF PRESENT ILLNESS:  [name] is a 55 year old female  admitted to the [facility] on February 15, 2008, with shortness of  breath.   [name] has been missing dialysis on a regular basis.  The patient  has been having depression for approximately 6 weeks, which has  worsened.  She is having decreased energy, difficulty concentrating as  well as insomnia.  The patient denies having missed the dialysis in  order to commit suicide; however, she does acknowledge that the  decreased energy as part of her depression has made it harder for her  to comply with her dialysis schedule.  She does have anhedonia.   She is not having any suicidal thoughts.  She has no hallucinations or  delusions.   The patient is currently cooperative.  Her memory and orientation  function are intact.   PAST PSYCHIATRIC HISTORY:  In review of the past medical record, [name] was prescribed Zoloft in January 2009.  Also, she has a history  of feeling on edge and muscle tension, for which she is taking Klonopin.   She also has a history of Restoril 30 mg q.h.s. for insomnia.   FAMILY PSYCHIATRIC HISTORY:  None known.   SOCIAL HISTORY:  [name] was educated through the tenth grade.  Her  parents have  both passed away.  [name] does not use alcohol or  illegal drugs.  She used to work at [employer].  She has been living  alone.  She does have a brother.   [name] has been assessed to have mild mental retardation in the  past.   PAST  MEDICAL HISTORY:  Anemia, gastroesophageal reflux disease,  bilateral nephrectomy  requiring hemodialysis, seizure disorder,  tuberous sclerosis.   MEDICATIONS:  The MAR is reviewed.  The patient is on Paxil 30 mg daily  and Topamax 200 mg b.i.d.   ALLERGIES:  TRIMETHADIONE, MEDROL AND CONTRAST MEDIA.   LABORATORY DATA:  Sodium 130, BUN 75, creatinine 10.12, platelet count  79, WBC 6.5, hemoglobin 7.6, platelet count 106.   REVIEW OF SYSTEMS:  Constitutional, head, eyes, ears, nose, throat,  neurologic, psychiatric,  cardiovascular, respiratory, gastrointestinal,  genitourinary, skin, musculoskeletal, hematologic, lymphatic, endocrine,  metabolic all unremarkable.   PHYSICAL EXAMINATION:  VITAL SIGNS:  Temperature 97.4, pulse 80,  respiratory rate 19, blood pressure 124/62, O2 saturation on room air  97%.  GENERAL APPEARANCE:  [name] is a middle-aged female lying in a  supine position in her hospital bed with no abnormal involuntary  movements.   MENTAL STATUS EXAM:  [name] is alert.  Her eye contact is  good.  She is oriented to all spheres.  Her concentration is mildly decreased.  Mood is depressed.  Affect is constricted.  Memory is intact to  immediate, recent and remote.  Her fund of knowledge and intelligence  are within normal limits.  Speech involves normal rate and prosody  without dysarthria.  Thought process logical, coherent, goal-directed.  No looseness of associations.  Thought content:  No thoughts of harming  herself, no thoughts of harming others.  No delusions, no  hallucinations.  Insight is intact for depression and judgment is  intact.   ASSESSMENT:  AXIS I:  1. 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  2. 296.33, major depressive disorder, recurrent, severe.  AXIS II:  Deferred.  AXIS III:  See past medical history.  AXIS IV:  General medical, primary support group.  AXIS V:   55.   [name] is not at risk to harm herself or others.  She agrees to  call emergency services for any thoughts of harming herself or other  psychiatric emergency symptoms.   The undersigned provided ego support.  The indications, alternatives and  adverse effects of Cymbalta as well as trazodone were discussed with the  patient.  The patient understands and wants to proceed as below.   RECOMMENDATIONS:  1. Would taper off the Paxil, reducing it to 10 mg q.a.m. tomorrow and      the next day, then discontinue.  2. Would start Cymbalta tomorrow at 20 mg p.o. q.a.m. and then would      increase to 30 mg p.o. q.a.m. by the third day.  Would then proceed      to increase her Cymbalta as tolerated to 30 mg p.o. b.i.d.  3. If the patient does continue with insomnia into the next week,      would start trazodone at 25 mg q.h.s. and then titrate according to      the patient's insomnia persisting by 25 mg of trazodone per night      until her sleep is normal, usually not requiring more than 100 mg      of trazodone q.h.s. and would not exceed 200 mg q.h.s.  4. Preliminary discharge planning:  Would have this patient follow up      with one of the clinics attached to [facility] [place]      or [facility] for psychiatric medication management and      counseling.      Antonietta Breach, M.D.  Electronically Signed     JW/MEDQ  D:  02/17/2008  T:  02/17/2008  Job:  [id number]

## 2010-11-28 NOTE — Procedures (Signed)
CEPHALIC VEIN MAPPING   INDICATION:  ESRD.  Status post multiple failed left-sided AVF/grafts.   HISTORY:  Evaluate right upper extremity for possible AVF.   EXAM:  Right upper extremity vein mapping.   The right cephalic vein is not compressible.   Diameter measurements range from 0.25 to 0.53 cm.   The left cephalic vein is not evaluated.   AGE:  55   IMPRESSION:  Patent right cephalic vein which is of acceptable diameter  for use of a dialysis access site.   ___________________________________________  Larina Earthly, M.D.   AR/MEDQ  D:  05/19/2007  T:  05/20/2007  Job:  [id number]

## 2010-11-28 NOTE — Op Note (Signed)
[name], [name]             ACCOUNT NO.:  [id number]   MEDICAL RECORD NO.:  [id number]          PATIENT TYPE:  AMB   LOCATION:  SDS                          FACILITY:  [facility]   PHYSICIAN:  Quita Skye. Hart Rochester, M.D.  DATE OF BIRTH:  1955-11-08   DATE OF PROCEDURE:  05/21/2007  DATE OF DISCHARGE:  05/21/2007                               OPERATIVE REPORT   PREOPERATIVE DIAGNOSIS:  End-stage renal disease.   POSTOPERATIVE DIAGNOSIS:  End-stage renal disease.   OPERATION:  Creation of a right radial artery to cephalic vein AV  fistula (Cimino shunt).   SURGEON:  Quita Skye. Hart Rochester, M.D.   FIRST ASSISTANT:  Nurse.   ANESTHESIA:  Local.   PROCEDURE:  The patient was taken to the operating room, placed in the  supine position at which time the right upper extremity was prepped with  Betadine scrub and solution and draped in a routine sterile manner.  After infiltration with 1% Xylocaine, short longitudinal incision was  made proximal to the wrist, cephalic vein dissected free.  Preoperative  vein mapping had revealed this to be between 2.5 and 3 cm in size in the  forearm and possibly suitable for fistula.  Vein was dissected free,  ligated distally and transected and was proximally 2.5 mm in size.  Artery was exposed beneath the fascia.  It was a small vessel, being  about 2 mm in size, but did have a good pulse.  We gave 3000 units of  heparin given intravenously.  Artery occluded proximally and distally  with vessel loops, opened with a 15 blade, extended with Potts scissors,  vein was carefully measured spatulated and anastomosed end-to-side with  6-0 Prolene.  Vessel loops were then released and there was a palpable  pulse and thrill in the fistula and fairly good Doppler flow up to the  antecubital area.  No protamine was given.  The wound was irrigated with  saline, closed in layers with Vicryl in a subcuticular fashion.  Sterile  dressing applied.  The patient taken to the  recovery room in  satisfactory condition.      Quita Skye Hart Rochester, M.D.  Electronically Signed     JDL/MEDQ  D:  05/21/2007  T:  05/21/2007  Job:  [id number]

## 2010-11-28 NOTE — Procedures (Signed)
EEG NUMBER:  [id number].   HISTORY:  This is a 56 year old patient with tuberous sclerosis and a  history of seizure-type events.  The patient last had an event on September 20, 2007.  The patient has a history of depression, mental retardation.  Medications include Topamax, PhosLo, Klonopin, Protonix, Advair, Paxil,  Renagel, Mysoline, oxycodone.   This was a routine EEG.  No skull defects noted.   EEG classification:  Dysrhythmia grade 2 left temporal.   DESCRIPTION:  The background rhythm of this recording consists of a  moderately well-modulated background activity of 6 to 7 Hz.  As the  record progresses, the most notable feature of the recording includes a  dysrhythmic theta activity and occasional sharp transients emanating  from the left mid temporal area.  This is seen frequently throughout the  recording.  Photic stimulation was performed earlier in the recording,  resulting in a bilateral and symmetric photic driving  response.  Hyperventilation was not performed.  As the record progresses, the  patient enters stage II sleep with rudimentary sleep spindles seen.  The  patient spends the majority of the recording sleeping.  At no time  during the recording does there appear to be actual spike or spike wave  discharges.  EKG monitor shows no evidence of cardiac rhythm  abnormalities with a heart rate of 66.   IMPRESSION:  This is an abnormal EEG recording due to dysrhythmic theta  activity emanating from the left temporal area.  Such a recording  suggests left brain dysfunction with irritability.  This study suggests  a lowered seizure threshold although no epileptic seizure events were  recorded.  This study suggests a left brain focus.      Marlan Palau, M.D.  Electronically Signed     WGN:FAOZ  D:  09/26/2007 13:57:46  T:  09/27/2007 16:58:48  Job #:  [id number]

## 2010-11-28 NOTE — Op Note (Signed)
[name], [name]             ACCOUNT NO.:  [id number]   MEDICAL RECORD NO.:  [id number]          PATIENT TYPE:  AMB   LOCATION:  SDS                          FACILITY:  [facility]   PHYSICIAN:  Larina Earthly, M.D.    DATE OF BIRTH:  1955-08-25   DATE OF PROCEDURE:  11/04/2008  DATE OF DISCHARGE:                               OPERATIVE REPORT   PREOPERATIVE DIAGNOSIS:  End-stage renal disease.   POSTOPERATIVE DIAGNOSIS:  End-stage renal disease.   PROCEDURE:  Right upper arm HeRO graft.   SURGEON:  Larina Earthly, M.D.   ASSISTANT:  Nurse.   ANESTHESIA:  General endotracheal.   COMPLICATIONS:  None.   DISPOSITION:  Recovery room, stable.   PROCEDURE IN DETAIL:  The patient was taken to the operating room and  placed in supine position.  The area of the right arm, right axilla, and  right chest prepped and draped in the usual sterile fashion.  Using the  ultrasound visualization, the internal jugular vein was occluded, but  there was a patent external jugular vein.  Using Seldinger technique, a  guidewire was passed down, initially would not pass down into the level  of the right atrium.  End-hole catheter was then passed over the J-wire,  and with manipulation, the catheter was positioned down into the level  of the right atrium.  The Amplatz superstiff wire was exchanged for the  J-wire.  A dilator and peel-away sheath was passed over the guidewire in  the Silastic stent portion of the HeRO graft and was passed through the  peel-away sheath, which was then removed.  This was secured and  controlled with a clamp.  Next, incision was made in the deltopectoral  groove and a tunnel was created from this area to the internal jugular  vein entry site and the Silastic stent portion of the HeRO graft was  brought through the subcutaneous tunnel to the deltopectoral groove.  This again was flushed with heparinized saline and reoccluded.  Next,  incision was made over the brachial  artery and a tunnel was created from  this level to the area of the deltopectoral groove.  The PTFE graft  portion of the HeRO graft was brought through the subcutaneous tunnel  and the Silastic portion was cut to appropriate length and the titanium  hub was connected to the Silastic portion of the graft.  This was  flushed with heparinized saline and reoccluded.  Next, the brachial  artery was occluded proximal and distal to an old nonfunctional  anastomosis.  The Gore-Tex graft was removed from the brachial artery.  The graft was spatulated to the appropriate length and was sewn end-to-  side to the artery with a running 6-0 Prolene suture.  Clamp was removed  and good drill was noted.  The wounds were  irrigated with saline.  Hemostasis with electrocautery.  Wounds were  closed with 3-0 Vicryl in the subcutaneous and subcuticular tissue.  Benzoin and Steri-Strips were applied.  The tip of the catheter was  positioned at the level of distal right atrium.  Larina Earthly, M.D.  Electronically Signed     TFE/MEDQ  D:  11/04/2008  T:  11/05/2008  Job:  [id number]

## 2010-11-28 NOTE — Assessment & Plan Note (Signed)
OFFICE VISIT   [name], [name]  DOB:  Mar 07, 1956                                       04/28/2007  EAVWU#:[id number]   The patient presents today for discussion of AV access.  She had had  multiple failed left arm AV grafts and AV fistulas, and is currently  being dialyzed via a right IJ Diatek catheter.  She underwent ultrasound  imaging today, revealing cephalic vein patent from her wrist up to her  upper arm with measurements varying mainly in the 0.3-cm to 0.5-cm  range.  I discussed options with her.  I have recommended we proceed  with exploration of her cephalic vein at the wrist and AV fistula  creation if possible.  She dialyzes on Tuesday, Thursday and Saturday.  Will be scheduled for surgery by Dr. Durene Cal with a right arm AV  fistula on 11/05.   Larina Earthly, M.D.  Electronically Signed   TFE/MEDQ  D:  05/16/2007  T:  05/20/2007  Job:  [id number]   cc:   [facility]

## 2010-11-28 NOTE — Assessment & Plan Note (Signed)
OFFICE VISIT   [name], [name]  DOB:  [birth year]                                       08/29/2007  [id number]#:[id number]   The patient presents today for continued discussion regarding her  hemodialysis access.  She reports multiple failed bilateral upper  extremity grafts.  Most recent, she had a right upper arm graft placed  by Dr. Fabienne Bruns on 06/23/2007 and early occlusion of this.  She  now has a right IJ Diatek catheter placed by radiology department while  an inpatient at [facility].  Her incisions are healed in her  right arm.  She has non-functional grafts in both arms.  She has no  history of lower extremity arterial insufficiency.  I had a long  discussion with the patient.  I explained that her next only option that  I see is leg graft.  I explained that we would be concerned regarding  this due to her obesity, difficulty with concern regarding wound  problems, and also concern with recurrent graft occlusions.  I  explained, however, that long-term catheter use is risky with recurrent  infections.  I have recommended right femoral loop graft placement.  She  is not willing to proceed with this at this time, and will continue  discussion with Dr. Darrick Penna, and advise [place] when she wishes to proceed.   Larina Earthly, M.D.  Electronically Signed   TFE/MEDQ  D:  08/29/2007  T:  09/01/2007  Job:  [id number]   cc:   Fayrene Fearing L. Deterding, M.D.  [facility] [employer]

## 2010-11-28 NOTE — Op Note (Signed)
NAMEJILLIAN, [name]             ACCOUNT NO.:  [id number]   MEDICAL RECORD NO.:  [id number]          PATIENT TYPE:  AMB   LOCATION:  SDS                          FACILITY:  [facility]   PHYSICIAN:  Larina Earthly, M.D.    DATE OF BIRTH:  [birth year]   DATE OF PROCEDURE:  DATE OF DISCHARGE:  07/06/2008                               OPERATIVE REPORT   PREOPERATIVE DIAGNOSIS:  End-stage renal disease.   POSTOPERATIVE DIAGNOSIS:  End-stage renal disease.   PROCEDURE:  Placement of right femoral 55 cm Diatek catheter.   SURGEON:  Larina Earthly, MD.   ASSISTANT:  Nurse.   ANESTHESIA:  MCA.   COMPLICATIONS:  None.   DISPOSITION:  To recovery room, stable.   PROCEDURE IN DETAIL:  The patient was taken to the operating room and  placed in supine position where the area of the right and left groins  were prepped and draped in the usual sterile fashion.  Ultrasound  imaging revealed wide patency of the right common femoral vein.  Using  local anesthesia and an 18-gauge needle, the right common femoral vein  was easily entered and guidewire was passed up to the level of the right  atrium, this was confirmed with fluoroscopy.  A dilator and peel-away  sheath were passed over the guidewire.  The dilator and guidewire were  removed and the 55-cm Diatek catheter was passed through the peel-away  sheath.  This was positioned up as far as it would go and was below the  level of the right atrium and inferior vena cava.  A separate incision  was made over the anterior thigh and a tunnel was created from this side  up to the catheter entry site.  The catheter was brought through the  subcutaneous tunnel.  The 2-lumen ports were attached.  Both lumens were  flushed and aspirated easily and were locked with 1000 units mL per  heparin.  The catheter was secured to the skin with 3-0 nylon stitch and  the entry site was closed with 4-0 subcuticular Vicryl stitch.  Sterile  dressing was applied.  The  patient was taken to the recovery room where  KUB was obtained.      Larina Earthly, M.D.  Electronically Signed     TFE/MEDQ  D:  07/06/2008  T:  07/07/2008  Job:  [id number]

## 2010-11-28 NOTE — H&P (Signed)
[name], [name] NO.:  Stone   MEDICAL RECORD NO.:  [id number]          PATIENT TYPE:  INP   LOCATION:  1825                         FACILITY:  [facility]   PHYSICIAN:  Ramiro Harvest, MD    DATE OF BIRTH:  1955/11/02   DATE OF ADMISSION:  03/11/2008  DATE OF DISCHARGE:                              HISTORY & PHYSICAL   PRIMARY CARE PHYSICIAN:  Dr. Lovell Sheehan of [facility].   NEPHROLOGIST:  Dr. Darrick Penna and Dr. Eliott Nine of [facility]   HISTORY OF PRESENT ILLNESS:  [name] is a 55 year old white  female with history of multiple medical problems including end-stage  renal disease on hemodialysis on Tuesday, Thursday and Saturdays.  Medical noncompliance with HD, mild mental retardation, GERD, anemia of  chronic disease who presents to the ED with a 3-day history of worsening  shortness of breath, generalized weakness, nausea, diffuse abdominal  pain, diarrhea, constipation, bilateral lower extremity edema and  intermittent confusion.  The patient states the no change in her chronic  shortness of breath.  The patient however endorses having some upper  respiratory symptoms recently with cough or clear sputum.  The patient  states that he has missed dialysis over the past week and feels scared  to go to the outpatient dialysis unit.  The patient denies any melena or  hematemesis.  No hematochezia.  No focal neurological symptoms.  The  patient also with some bouts of confusion and feels depressed.  The  patient had been to the ED over the past 2 days with similar complaints,  was found to have a potassium of 6.1.  The patient was given Kayexalate  on March 10, 2008, and per ED records.  ED physician spoke with a renal  doctor on call.  Arrangements were made for the patient to attend  dialysis today, however the patient did not attend dialysis and  presented to the ED today with the above stated symptoms.  In the ED,  chest x-ray done showed a cardiomegaly with vascular congestion and  interstitial edema.  CBC with a white count of 3.5, hemoglobin 7.5,  platelets 141, hematocrit 22.5, I-stat 8 with a sodium of 133, potassium  5.2, chloride 109, glucose 85, BUN 79, creatinine 12.6, FOBT on March 10, 2008, was negative.  We were called to admit the patient for further  evaluation and management.   ALLERGIES:  IV DYE, IODINE CONTRAST, MEDROL AND TRIDIL.   PAST MEDICAL HISTORY:  1. End-stage renal disease on hemodialysis on Tuesdays, Thursdays and      Saturdays with a [facility].  2. Life threatening hyperkalemia on February 15, 2008.  3. Gastroesophageal reflux disease.  4. Mild mental retardation.  5. History of seizure disorder.  6. Asthma.  7. History of noncompliance with hemodialysis with consequent emergent      medical situations including life threatening hyperkalemia and      bradycardic arrest.  8. History of bradycardia with heart rate in the 20s.  9. History of tuberous sclerosis and IV normal sedation.  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  11.Anemia of chronic disease.  12.Secondary hyperparathyroidism.  13.Poor vascular access with a maturing left upper extremity graft      which was placed at [facility].  14 . History of MRSA bacteremia secondary to catheter infection.  1. Seasonal allergies.  2. Depression.   HOME MEDICATIONS.:  1. Calcium acetate 1444 mg t.i.d.  2. __________200 mg p.o. b.i.d.  3. Multivitamin daily.  4. Primidone 250 mg p.o. t.i.d.  5. Renagel 2400 mg t.i.d. with meals.  6. Celebrex 200 mg p.o. p.r.n.  7. Aspirin 325 mg daily.  8. Allegra 60 mg daily.  9. Nephro-Vite one tablet q.h.s.  10.Aleve as needed.  11.Advair 2 puffs b.i.d.  12.Triamcinolone Acetate.  13.Nexium 40 mg b.i.d.  14.Procrit 28,000 units t.i.d. a week with dialysis.  15.Cymbalta 30 mg p.o. daily.   SOCIAL HISTORY:  The patient lives alone in [place],  is single, has a  tenth grade high school education.  Is disabled.  No tobacco use.  No  alcohol use.  No IV drug use.   FAMILY HISTORY:  Mother deceased age 91 from a MVA.  Father alive in his  44s with type 2 diabetes, history of CVA.  The patient has one brother  deceased in a MVA and another brother deceased with meningitis.  Another  brother was found hanging secondary to suicide likely.  The patient has  no children.   REVIEW OF SYSTEMS:  As per HPI, otherwise negative.   PHYSICAL EXAMINATION:  VITAL SIGNS:  Temperature 98.1, blood pressure  150/80, pulse of 95, respiratory rate 20, sating 95% on room air.  GENERAL: The patient is obese female sleeping no apparent distress.  HEENT: Normocephalic, atraumatic.  Pupils equal, round and reactive to  light.  Extraocular movements intact.  Oropharynx is clear.  No lesions,  no exudates.  Dry mucous membranes.  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  RESPIRATORY:  Lungs are clear to auscultation bilaterally.  However,  decreased breath sounds in the bases.  No crackles.  No rhonchi.  CARDIOVASCULAR:  Regular rate and rhythm.  No murmurs, rubs or gallops.  Distant heart sounds.  No JVD.  ABDOMEN:  Soft, diffusely tender to palpation.  Positive bowel sounds.  No rebound or guarding.  EXTREMITIES:  No clubbing or cyanosis, 2+ bilateral lower extremity  edema.  Left upper extremity with AV graft with good thrill and bruit  with a healed incision.  NEUROLOGICAL:  The patient is alert and oriented x3.  Cranial nerves II-  XII are grossly intact.  No focal deficits.   LABORATORY DATA:  CBC:  White count 3.5, hemoglobin 7.5, hemoglobin was  9.4 on February 18, 2008, platelets 141, hematocrit 22.5, MCV of 104.9, ANC  of 2.8.  I-studies:  Sodium 133, potassium 5.2, chloride 109, BUN 79,  creatinine to 12.6, potassium was 6.1 on March 09, 2008.  FOBT was  negative on March 10, 2008.  Chest X-Ray: Mild cardiomegaly, vascular  congestion  probable interstitial edema.   ASSESSMENT AND PLAN:  [name] is a 55 year old female with  history of multiple medical problems including noncompliance with HD  with consequent emergent medical situations.  Also, history of end-stage  renal disease on hemodialysis, history of anemia of chronic disease and  GERD.   PROBLEMS:  1. Symptomatic anemia likely secondary to anemia of chronic disease as      the patient is not getting Procrit with dialysis due to medical  noncompliance with hemodialysis.  FOBT was negative on March 10, 2008.  Will transfuse 2 units packed red blood cells with Lasix in      between units to decreased volume overload.  Monitor CBCs and      follow.  2. End-stage renal disease.  Tuesday, Thursday, Saturday.  The patient      is noncompliant with plans for dialysis over the past week.      Continue PhosLo, Nephro-Vite, Renagel and Procrit.  Consult with      Renal for dialysis.  Most of the patient's symptoms, likely      secondary to noncompliance with hemodialysis.  3. Volume overload secondary to problem #2 dialysis.  4. Hyperkalemia secondary to problem #2.  No peak T-waves on EKG.      Kayexalate 45 grams  p.o. times one with dialysis.  5. Seizure disorder.  Primidone and Topamax.  6. Secondary  hyperparathyroidism.  Continue Renagel and PhosLo.  7. Major depression.  The patient complaining of depressive      symptoms/depression.  Would like and want to be seen by Dr.      Jeanie Sewer during this admission.  Will increase the patient's      Cymbalta to 30 mg p.o. b.i.d. as instructed per prior discharge.      The patient will need a psychiatric consult in the morning.  8. Gastroesophageal reflux disease, Protonix.  9. Seasonal allergies.  Allegra.  10.Prophylaxis.  Protonix for GI prophylaxis.  SCDs for DVT      prophylaxis.   It has been a pleasure taking care of Ms. Sydnee Levans.      Ramiro Harvest, MD  Electronically  Signed     DT/MEDQ  D:  03/11/2008  T:  03/11/2008  Job:  [id number]   cc:   Stacie Glaze, MD  Llana Aliment. Deterding, M.D.  [facility] Eliott Nine, M.D.

## 2010-11-28 NOTE — Progress Notes (Signed)
  Subjective:    Patient ID: [name], female    DOB: 1956-01-14, 55 y.o.   MRN: [id number]  HPI Weight gain Has been feeling well except for congestion     Review of Systems  Constitutional: Negative for activity change, appetite change and fatigue.  HENT: Negative for ear pain, congestion, neck pain, postnasal drip and sinus pressure.   Eyes: Negative for redness and visual disturbance.  Respiratory: Negative for cough, shortness of breath and wheezing.   Gastrointestinal: Negative for abdominal pain and abdominal distention.  Genitourinary: Negative for dysuria, frequency and menstrual problem.  Musculoskeletal: Negative for myalgias, joint swelling and arthralgias.  Skin: Negative for rash and wound.  Neurological: Negative for dizziness, weakness and headaches.  Hematological: Negative for adenopathy. Does not bruise/bleed easily.  Psychiatric/Behavioral: Negative for sleep disturbance and decreased concentration.       Objective:   Physical Exam  Constitutional: She is oriented to person, place, and time. She appears well-developed and well-nourished. No distress.  HENT:  Head: Normocephalic and atraumatic.  Right Ear: External ear normal.  Left Ear: External ear normal.  Nose: Nose normal.  Mouth/Throat: Oropharynx is clear and moist.  Eyes: Conjunctivae and EOM are normal. Pupils are equal, round, and reactive to light.  Neck: Normal range of motion. Neck supple. No JVD present. No tracheal deviation present. No thyromegaly present.  Cardiovascular: Normal rate, regular rhythm, normal heart sounds and intact distal pulses.   No murmur heard. Pulmonary/Chest: Effort normal and breath sounds normal. She has no wheezes. She exhibits no tenderness.  Abdominal: Soft. Bowel sounds are normal.  Musculoskeletal: Normal range of motion. She exhibits no edema and no tenderness.  Lymphadenopathy:    She has no cervical adenopathy.  Neurological: She is alert and  oriented to person, place, and time. She has normal reflexes. No cranial nerve deficit.  Skin: She is not diaphoretic.       Dialysis site good  Psychiatric: She has a normal mood and affect. Her behavior is normal.          Assessment & Plan:
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
## 2010-11-28 NOTE — Op Note (Signed)
NAMEMASHAL, [name]             ACCOUNT NO.:  [id number]   MEDICAL RECORD NO.:  [id number]          PATIENT TYPE:  AMB   LOCATION:  SDS                          FACILITY:  [facility]   PHYSICIAN:  VDurene Cal IV, MDDATE OF BIRTH:  04-13-1956   DATE OF PROCEDURE:  DATE OF DISCHARGE:                               OPERATIVE REPORT   PREOPERATIVE DIAGNOSIS:  End stage renal disease with thrombosed left  forearm loop graft.   POSTOPERATIVE DIAGNOSIS:  End stage renal disease with thrombosed left  forearm loop graft.   PROCEDURE PERFORMED:  Thrombectomy and revision of the left forearm loop  graft.   TYPE OF ANESTHESIA:  Monitored anesthesia care.   BLOOD LOSS:  200 mL.   FINDINGS:  The basilic vein was not adequate.  The brachial vein was  identified with ultrasound and felt to be adequate outflow, therefore, a  separate stab counterincision was made.  The brachial artery was exposed  and the venous anastomosis was to the brachial vein.   INDICATIONS FOR PROCEDURE:  This is a 55 year old female with end stage  renal disease who has a left forearm loop graft that was placed in June  of 2008.  It subsequently thromboses.  The patient underwent  percutaneous attempted thrombectomy yesterday and it was successful,  however, the graft clotted this morning.  On review of images, the  basilic vein was of poor quality.   PROCEDURE:  The patient was identified in the holding and taken to room  6.  She was placed supine on the table.  MAC was administered.  The  patient was prepped and draped in a standard sterile fashion.  A timeout  was called and perioperative antibiotics were administered.  Lidocaine  1% was used for local anesthesia.  The previous incision above the  antecubital fossa was opened after being anesthetized with 1% lidocaine.  Bovie cautery was used to dissect the subcutaneous tissue.  The previous  placed graft was identified and circled with a vessel loop.  This  was  further localized towards the anastomosis with the basilic vein.  This  was an end-to-side anastomosis.  There was significant scarring in this  region.  The outflow vein did not appear to be adequate at operation for  revision.   At this point in time, I used the ultrasound to evaluate the upper arm  veins.  There was a brachial vein that appeared to be of adequate  caliber.  A separate incision was made just proximal to the previous  incision and the brachial vein was isolated proximally and distally.  On  inspection, it appeared to be an adequate caliber vein.  Once the  proximal and distal exposure had been obtained, the vessel was occluded.  A venotomy was made with a #11 blade and extended with Potts scissors.  A 6-mm graft was then anastomosed at the brachial vein using running 6-0  Prolene suture.  At the completion of the anastomosis, the graft was  brought through a subcutaneous tunnel to the incision, which exposed the  previous graft.  At this point in time, the  patient was systemically  heparinized.  The cuff of the venous end anastomosed and the brachial  vein was ligated with 6-0 Prolene.  A #4 Fogarty was then used to  perform thrombectomy of the graft.  This was done approximately 4 times.  All thrombus was evacuated.  Next, the 2 limbs were anastomosed using a  running 6-0 Prolene suture.  Prior to completion of the anastomosis, the  venous end was backbled and the arterial end was opened for antegrade  flushing.  Heparinized saline was then instilled into each side.  The  anastomosis was then completed.  There was a brisk Doppler signal in the  outflow vein.  The graft was difficult to palpate on the skin given the  patient's body habitus.  It was evaluated with the Doppler and found to  be patent.  There was signal in the radial artery as well.   At this point in time, the decision was made to close.  The wounds were  copiously irrigated and deep tissue was  reapproximated with interrupted  3-0 Vicryl.  The subcutaneous tissue was closed with interrupted 3-0  Vicryl.  The skin was closed with a running 4-0 Vicryl.  Dermabond was  then placed.  The patient tolerated the procedure well and was to the  recovery room in stable condition.      Jorge Ny, MD  Electronically Signed     VWB/MEDQ  D:  03/17/2007  T:  03/17/2007  Job:  [id number]

## 2010-11-28 NOTE — Consult Note (Signed)
NAMEOAKLEIGH, HESKETH NO.:  [id number]   MEDICAL RECORD NO.:  [id number]          PATIENT TYPE:  OIB   LOCATION:  5523                         FACILITY:  [facility]   PHYSICIAN:  James L. Deterding, M.D.DATE OF BIRTH:  [birth year]   DATE OF CONSULTATION:  DATE OF DISCHARGE:                                 CONSULTATION   REASON FOR CONSULTATION:  Hypertension, anemia, hyperparathyroidism,  obesity and tuberous sclerosis.   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  She has been on dialysis at [facility], doing well.  She had  a left upper arm fistula that developed a progressive stenosis and  occlusion of the central cephalic vein.  She also had some  pseudoaneurysms there.  She is here today for a new access.  She had a  left lower arm graft placed and a right IJ catheter placed.  The graft  developed a clot in the PACU and she had to have a thrombectomy.   She lives alone and is trying to stay overnight at this point.  She will  re dialysis in the morning.   MEDICATIONS AT HOME:  Include:  1. Renagel three with meals.  2. PhosLo two with meals.  3. Klonopin 1 mg at nighttime.  4. Primidone 300 mg b.i.d.  5. Nexium 40 mg at nighttime.  6. EPO 26,000 units each dialysis.  7. InFeD 50 mg a week.  8. Allegra 60 mg b.i.d.  9. Keppra 500 mg b.i.d.  10.Nephrovite one a day.  11.Topamax 200 mg b.i.d.  12.Zoloft 50 mg a day.   PAST MEDICAL HISTORY:  Past medical history includes:  1. The tuberous sclerosis and nephrectomies at [facility] in 1995 and 1990.  2. In 2007, due to angiomyolipomas, __________ and left venacaval      thrombectomy.  3. 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  4. She has had a small bowel repair at [facility] in 2007.  5. She has a seizure disorder.  6. She has end-stage renal disease.  7. Hypertension.  8. Obesity.  9. Depression.  10.Asthma.  11.Iron deficiency anemia.  12.Cholecystectomy in 1991.  13.Obstructive sleep apnea.  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  15.Gastroesophageal reflux disease.  16.History of an MRSE bacteremia related to catheter in 2007, also.   SOCIAL HISTORY:  She lives at home.  She has a caretaker who comes in  and helps.  She has no children.  No alcohol.  No smoking.   FAMILY HISTORY:  Not significant for congenital disease.   REVIEW OF SYSTEMS:  GENERAL:  She gets tired quite easily.  She has done  much better and she has chronic anxiety that has done much better now  taking care of herself, and she has been counseled extensively at the  kidney center with that.  ENT:  She has occasional headaches and had  migraines in the past.  Her neurologist follows her for that.  PULMONARY:  She has had no cough, sputum production or wheezing  recently.  GI:  No nausea, vomiting.  Occasional constipation, diarrhea.  CARDIOVASCULAR:  Sleeps with 1 pillow.  No PND or orthopnea.  A little  bit of __________ edema.  We were unable to get her dry weight down  recently.  She has been trying to lose weight and has been doing much  better at that.  SKIN:  She has angiokeratomas of the head and neck.  GI:  No nausea, vomiting or diarrhea.  NEUROLOGICAL:  Negative.   PHYSICAL EXAMINATION:  VITAL SIGNS:  Blood pressure is 112/77, heart  rate is 70, temperature is 97.6, respiratory rate 16.  GENERAL:  She is an obese white female in no acute distress.  Prominent  angiokeratoma of the head and neck.  HEENT:  Discs and retinal vessels unremarkable.  Pharynx:  Unremarkable.  LUNGS:  No rales, rhonchi or wheezes.  Breath sounds are decreased.  ABDOMEN:  Obese.  Positive bowel sounds.  Liver at subcostal margin.  No  splenomegaly.  She has a left forearm AVG; does not have a bruits.  She  has trace to 1+ edema.  SKIN:  Angiokeratoma.  She has some stasis changes in the lower  extremities.  NEUROLOGIC EXAM:   Cranial nerves II-XII are grossly intact.  Motor is  5/5, symmetric.  Deep tendon reflexes are 2+/4+ in the upper  extremities, 1+/4+ in the lower extremities.  Toes downgoing.   LABORATORY DATA:  Potassium is 5.6, sodium 134, glucose 96.  Chest x-  ray:  No pneumothorax.   ASSESSMENT:  1. Clotted left upper extremity AVG, for thrombectomy.  2. New left upper extremity AVG and right IJ Diatek.  3. End-stage renal disease, receiving dialysis in the morning.      __________ is up a little.  4. Iron-deficiency anemia.  Her hemoglobin is now up; she had run      severe anemia for a long time, but she is on high dose      erythropoietin.  5. Seizure disorders, controlled at this time on the Keppra as well as      the Klonopin and primidone.  6. Secondary hyperparathyroidism.  Continue to monitor and vitamin D.  7. Depression.  Continue with Zoloft and counseling.  8. Mild mental retardation.  9. Tuberous sclerosis.  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  11.Hypertension.  Will control her volume.  Continue her medications.           ______________________________  Llana Aliment Deterding, M.D.     JLD/MEDQ  D:  12/27/2006  T:  12/27/2006  Job:  [id number]

## 2010-11-28 NOTE — H&P (Signed)
[name], [name] NO.:  [id number]   MEDICAL RECORD NO.:  [id number]          PATIENT TYPE:  INP   LOCATION:  1824                         FACILITY:  [facility]   PHYSICIAN:  Kalman Shan, MD   DATE OF BIRTH:  [birth year]   DATE OF ADMISSION:  06/29/2008  DATE OF DISCHARGE:                              HISTORY & PHYSICAL   REFERRING DOCTOR:  Emergency room physicians.   TIME OF EVALUATION:  From 2:30 a.m. to 3:15 a.m. June 30, 2008 with  a 45-minute inpatient critical care evaluation.   REASON FOR EVALUATION:  SIRS and sepsis and transient hypotension  resolved with fluids.   HISTORY OF PRESENT ILLNESS:  This is a 55 year old morbidly obese female  with end-stage renal disease, multiple other medical problems including  dialysis noncompliance and history of MRSA infected graft.  She was at  dialysis on June 29, 2008 and had an episode of hypotension to 60  systolic.  She also reported some sinus congestion, cough, diarrhea the  preceding week, and therefore reported to the emergency room at around  7:30 p.m. on June 29, 2008.   On arrival she was found to have a temperature of 102.7, blood pressure  initially was 102/52 with a pulse of 102, respiratory rate of 16 and  pulse ox of 96%, but through the course of the night she did have some  transient hypotension around 10 p.m. to a systolic of 83, but after  getting a small bolus of IV fluids this has responded and blood pressure  has been largely above 90 systolic.  Much of the history is unavailable  to me as the patient is sleeping and I could not get a detailed history.  Whatever history I have is obtained from E-chart and emergency room  doctor notes.   PAST MEDICAL HISTORY:  1. Listed are asthma, hypertension, depression, hyperthyroidism,      mental retardation, morbid obesity, osteoarthritis, renal failure,      seizure disorder, sleep apnea and tubular sclerosis.  2. Most recently she  was admitted from October 31 to May 24, 2008      with an infected left upper arm arteriovenous graft and Gore-Tex      graft.  She was placed on vancomycin for this.  3. She was also seen by Dr. Jeanie Sewer on May 18, 2008 for in-      hospital delirium.  Was also thought to have history of major      depression.  He started her on Cymbalta, continued Ativan, and      recommended to hold Haldol.  4. Status post placement of a right femoral Diatek catheter on      May 18, 2008.  5. Patch angioplasty of the left brachial artery with bovine      pericardial patch and resection of brachial artery pseudoaneurysm      secondary to infected left upper arm arteriovenous Gore-Tex graft      on May 15, 2008.  6. Admission March 11, 2008 to March 25, 2008 with rectal      bleeding thought to be secondary  to internal hemorrhoids, anemia of      chronic disease, secondary hyperparathyroidism secondary to end-      stage renal disease, history of mental retardation with paranoid      schizophrenic traits, and prolonged QT syndrome on Seroquel.  7. Other past medical history includes medication noncompliance, end-      stage renal disease, GERD, mild mental retardation, history of      seizure disorder, history of asthma, angiomyolipoma, status post      bilateral nephrectomies, history of anemia of chronic disease,      history of bradycardic arrest secondary to noncompliance with      hemodialysis, history of MRSA bacteremia, SEASONAL ALLERGIES, and      depression.   CURRENT MEDICATIONS INCLUDE:  Calcium acetate, topiramate, paroxetine,  primidone, multivitamin, Renagel, Celebrex, Bayer aspirin, fexofenadine,  Rena-Vite, multivitamin, oral Aleve, triamcinolone topical cream, Advair  and Placidyl oral.   ALLERGIES:  1. INCLUDE SOLU-MEDROL.  2. IVP DYE.  3. ANY IODINE CONTAINING PRODUCTS.   REVIEW OF SYSTEMS:  As in history of present illness, otherwise  unobtainable.    SOCIAL HISTORY:  This is also unavailable to me at this point in time.  I have also not been able to get a good social history from reviewing  the E-chart except that she lives alone in [place], is single,  finished 10th grade, is disabled, no alcohol or tobacco.  No IV drug  abuse.   FAMILY HISTORY:  Mother deceased at age 77 from motor vehicle accident.  Father alive at age 53 with type 2 diabetes and history of CVAs.  She  has one brother that was deceased in a motor vehicle accident, another  brother that was deceased with meningitis, and another brother that was  found hanging secondary to suicide.  The patient has no children.  The  bottom line, only surviving member is her father.  I do not know if she  has any contact with him on not.   VITAL SIGNS: Temperature 102.7 at 7:30 p.m. June 29, 2008.  Currently temperature is unknown, pulse of 100, current systolic is  between 90-100, saturation 96%.  GENERAL EXAM:  This is a morbidly obese female sleeping on the stretcher  in the emergency room at bed 32 at [facility].  I could not arouse her easily  but she was snoring.  CENTRAL NERVOUS SYSTEM:  Snoring loudly and sleeping.  RESPIRATORY EXAM:  Trachea is central, air entry equal, diminished  overall, hard to hear any additional sounds secondary to obesity.  CARDIOVASCULAR:  Normal heart sounds, regular rate and rhythm.  Tachycardiac.  No murmurs.  Muffled heart sounds secondary to obesity.  ABDOMEN:  Obese, soft, nontender.  No organomegaly.  GENITOURINARY EXAM:  The vaginal area has a foul smell possibly of  candidiasis.  There is a right groin Diatek catheter present on the  skin, around it is not clearly red but just above that there is some  mild redness but no purulent discharge present.  There are some  peripheral IVs in her brachial area.  EXTREMITIES:  Show no cyanosis, no clubbing, no edema.   LABORATORY VALUES:  Chest x-ray, limited study shows no acute   abnormality.   CBC shows white count 6.4, hemoglobin 9.9, platelet count of 83,000.  Of  note in November 2009 her platelet count was 146,000.   Stool occult blood was negative.   ABG shows pH 7.40, pCO2 43 and a pO2 of 30.  This  is a venous ABG.   Lactic acid is normal at 2.   Troponin is 0.12.   Chemistry panel is essentially normal but with a creatinine of 6,  potassium is 4.9.   Pending lab tests include blood cultures x2 from June 29, 2008.   ASSESSMENT AND PLAN:  This is a morbidly obese female with multiple  medical problems, history of methicillin-resistant Staphylococcus  aureus, significant psychiatric history, medication noncompliance, very  poor social situation.  She is on chronic dialysis and is also dialysis  noncompliant.  She has a history of infected graft in the left side as  recently as October and November 2009.  She comes in now with fever and  transient hypotension that resolved with fluids.  There is no clear  localizing source.  The only abnormality is a possible redness around  the right Diatek catheter.  Therefore she could have had some groin  infection.  In addition she has foul smelling candidiasis smell from her  vaginal area.   PLAN:  1. Admit intensive care unit.  2. Panculture.  3. Treat with vanc, Zosyn and ciprofloxacin.  4. Gentle hydration, keep blood pressure over 90-100 systolic and map      greater than 50-55.  5. If she gets hypotensive, place central line.  This is going to be      risky given her morbidity.  6. Check serial ABGs.  7. Check serial labs.  8. Intubate if needed, currently does not seem to be have an      indication.  Of note, she would be a very difficult intubation,      probably best to be done under anesthesia.      Kalman Shan, MD  Electronically Signed     MR/MEDQ  D:  06/30/2008  T:  06/30/2008  Job:  [id number]

## 2010-11-28 NOTE — Consult Note (Signed)
NAMELATRONDA, SPINK NO.:  [id number]   MEDICAL RECORD NO.:  [id number]          PATIENT TYPE:  INP   LOCATION:  5024                         FACILITY:  [facility]   PHYSICIAN:  Bevelyn Buckles. Champey, M.D.DATE OF BIRTH:  [birth year]   DATE OF CONSULTATION:  DATE OF DISCHARGE:                                 CONSULTATION   REQUESTING PHYSICIAN:  Garnetta Buddy, M.D.   REASON FOR CONSULTATION:  Seizures.   HISTORY OF PRESENT ILLNESS:  Ms. Scalia is a 55 year old Caucasian  female with multiple medical problems including tuberous sclerosis with  seizure disorder currently on Topamax and primidone.  She was admitted  as she missed her outpatient hemodialysis appointment secondary to  transportation and had elevated potassium level.  The patient states her  seizures have been under good control.  She states she has not had a  seizure in about a year until this past Saturday, when she had 2  seizures and states she had a total of 6 seizures within the last 4-5  days.  She has had no seizures in the last 24 hours.  The seizure  usually consists of having a headache, becoming very quiet, and my eyes  are dilated.  She has impairment of consciousness, can follow commands  and interact during that time frame.  She does not bite her tongue or  have incontinence or shaking.  She states she occasionally has bilateral  hand numbness as well.  When the nurse was called to her room secondary  to seizure the nurse noticed nothing as usual.  She has had no further  seizures today.  She denies any focal weakness, vision changes,  swallowing problems, chewing problems, dizziness, vertigo, or loss of  consciousness.   PAST MEDICAL HISTORY:  1. TS seizures.  2. Depression.  3. Asthma.   MEDICATIONS:  Topamax, PhosLo, Klonopin, Protonix, Advair, Paxil,  Renagel, Mysoline.   ALLERGIES:  1. TRIMETHADIONE.  2. METHYLPREDNISOLONE.   FAMILY HISTORY:  Noncontributory.   SOCIAL HISTORY:   The patient does not smoke or use alcohol.   REVIEW OF SYSTEMS:  Positive as per HPI.  Review of systems negative as  per HPI and greater than 6 other organ systems.   PHYSICAL EXAMINATION:  VITAL SIGNS:  Temperature 99.2, blood pressure is  105/61, pulse is 60, respirations 18, O2 sat is 99%.  HEENT:  Normocephalic atraumatic.  Extraocular muscles are intact.  Face  symmetric.  NECK:  Supple.  HEART:  Regular.  LUNGS:  Clear.  ABDOMEN:  Soft.  EXTREMITIES:  Show good pulses.  NEUROLOGIC:  The patient is awake, alert.  Language is fluent.  The  patient follows commands appropriately.  Cranial nerves are intact.  Motor:  Shows good strength in the upper extremities.  Sensory:  Within  normal to light touch.  Reflexes are trace throughout.  Gait is not  tested secondary to safety.   LABORATORY:  WBC 3, hemoglobin 9.4, hematocrit 27.9, platelets 145.  Sodium is 132, potassium is 5.5, chloride is 97, CO2 is 25, BUN 47,  creatinine 7.48, glucose 82.  Phosphorous is 7,  calcium is 8.8.   IMPRESSION:  This is a 55 year old with tuberous sclerosis and seizure  disorder with questionable breakthrough seizures.   The seizures have been under good control on her current regimen.  I  question the current spells as they do not sound like epileptic  seizures, especially without loss of consciousness or impairment of  consciousness.  We will continue her on her current medications.  We  will check a primidone level and check an EEG.  Question whether these  episodes could be secondary to metabolic derangements from elevated  potassium, phosphorous.  We will continue her on her current treatment  and plan.  We will follow the patient and make adjustments as necessary.      Bevelyn Buckles. Nash Shearer, M.D.  Electronically Signed     DRC/MEDQ  D:  09/25/2007  T:  09/25/2007  Job:  [id number]

## 2010-11-28 NOTE — Op Note (Signed)
NAMESUMMERLYNN, GLAUSER             ACCOUNT NO.:  [id number]   MEDICAL RECORD NO.:  [id number]          PATIENT TYPE:  AMB   LOCATION:  SDS                          FACILITY:  [facility]   PHYSICIAN:  Larina Earthly, M.D.    DATE OF BIRTH:  02/02/1956   DATE OF PROCEDURE:  DATE OF DISCHARGE:                               OPERATIVE REPORT   PREOPERATIVE DIAGNOSIS:  End stage renal disease.   POSTOPERATIVE DIAGNOSIS:  End stage renal disease.   PROCEDURES:  Placement of right internal jugular Diatek.   SURGEON:  Larina Earthly, M.D.   ASSISTANT:  Nurse.   ANESTHESIA:  MAC.   COMPLICATIONS:  None.   DISPOSITION:  To recovery room stable.   PROCEDURE IN DETAIL:  The patient was taken to the operating room,  placed in supine position where the area of the right and left neck were  imaged with ultrasound.  The patient had a large external jugular vein  and also a patent internal jugular vein somewhat more laterally.  The  right and left neck and chest were prepped and draped in the usual  sterile fashion.  Using local anesthesia and a finder needle, the vein  was identified.  The guide wire were not passed centrally and contrast  was injected and this was the external jugular vein.  This was quite  tortuous.  Attempts were made at negotiating the tortuosity and this was  not successful, therefore, further attempts at the EJ were abandoned.  The internal jugular vein was then accessed with the Seldinger technique  and the guide wire was passed down to the level of the right atrium.  Dilator and peel-away sheath was passed over the guide wire and the  dilator and guide wire were removed.  A 28-cm Diatek catheter was passed  through the peel-away sheath, which was then removed as well.  The  catheter was brought through a subcutaneous tunnel through a separate  stab incision.  The two lumen ports were attached.  Both lumens flushed  and aspirated easily and were locked with 1,000 units/mL  heparin.  The  catheter was secured to the skin with 3-0 nylon stitch and the entry  site was closed with a 4-0 subcuticular Vicryl stitch.  A sterile  dressing was applied and the patient was taken to the recovery room in  stable condition.      Larina Earthly, M.D.  Electronically Signed     TFE/MEDQ  D:  03/18/2007  T:  03/18/2007  Job:  [id number]
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
## 2010-11-28 NOTE — Op Note (Signed)
[name], [name] NO.:  [id number]   MEDICAL RECORD NO.:  [id number]          PATIENT TYPE:  OBV   LOCATION:  2550                         FACILITY:  [facility]   PHYSICIAN:  Quita Skye. Hart Rochester, M.D.  DATE OF BIRTH:  [birth year]   DATE OF PROCEDURE:  08/08/2008  DATE OF DISCHARGE:  08/08/2008                               OPERATIVE REPORT   PREOPERATIVE DIAGNOSIS:  End-stage renal disease with exposed cuff,  right femoral Diatek catheter.   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   OPERATION:  1. Removal of right femoral Diatek catheter.  2. Insertion of new right femoral Diatek catheter (55 cm).   SURGEON:  Quita Skye. Hart Rochester, MD   FIRST ASSISTANT:  Nurse.   ANESTHESIA:  Local.   PROCEDURE:  The patient was taken to the operating room, placed in  supine position, at which time the right inguinal area was exposed, and  the area was prepped with Betadine scrub and solution and draped in  routine sterile manner.  There was a femoral Diatek catheter, which was  exiting distal to the inguinal crease with the cuff exposed about 5 cm  from the exit site.  After prepping and draping the catheter into the  wound and infiltration with 1% Xylocaine with epinephrine, a short  transverse incision was made just below the inguinal crease, but  proximal to the previous exit site.  Catheter was dissected free,  transected in the open wound, and a guidewire passed centrally into the  right atrium under fluoroscopic guidance.  The old catheter was removed  over the guidewire.  A new 55-cm Diatek catheter was then positioned as  far as it would go terminating in the inferior vena cava near the  cavoatrial junction.  Catheter was then tunneled peripherally, secured  with nylon sutures, and the wound closed with Vicryl in subcuticular  fashion, and catheter flushed easily.  The old exit site was then closed  with a simple  3-0 nylon suture.  Sterile dressing was applied.  The  patient was taken to recovery room in satisfactory condition.      Quita Skye Hart Rochester, M.D.  Electronically Signed     JDL/MEDQ  D:  08/08/2008  T:  08/08/2008  Job:  [id number]

## 2010-11-28 NOTE — H&P (Signed)
NAMEAZARRIA, BALINT NO.:  [id number]   MEDICAL RECORD NO.:  [id number]           PATIENT TYPE:   LOCATION:                                 FACILITY:   PHYSICIAN:  Wilber Bihari. Caryn Section, M.D.   DATE OF BIRTH:  Jul 15, 1956   DATE OF ADMISSION:  DATE OF DISCHARGE:                              HISTORY & PHYSICAL   HISTORY AND PHYSICAL:  Ms. Ellinwood is a 55 year old white woman with end-  stage renal disease on dialysis at the [facility].  She  has not been on dialysis for a week because of ? disagreement with  dialysis nurse or technician.  She came to the ER today weak and  nauseated.  Potassium was 8.70.  ER was told to send her immediately to  dialysis (2:00 p.m.).  An hour later (still in the emergency room) she  had decreased heart rate (20/minute) and poorly palpable pulse.  She was  given calcium, glucose, insulin, bicarb.  Her heart rate increased and  her pulse became palpable, she never lost consciousness.  EKG showed  wide complexes with peaked T-waves.  She was taken to MICU for emergency  dialysis.   PAST MEDICAL HISTORY:  1. Tuberous sclerosis with      a.     Adenoma sebation (skin).      b.     Bilateral nephrectomy (angiomyolipoma).      c.     Seizure disorder (sees Dr. Charleston Ropes).  2. End-stage renal disease on dialysis with      a.     anemia (on EPO).      b.     Secondary PTH (on vitamin D currently, last PTH was 30 when       checked April 2009).      c.     Poor vascular access (using right IJ PermCath.  She says AV       fistula planned left upper arm in June).  3. She has had MSSA bacteremia secondary to catheter infections in the      past.   Dialysis orders 4-1/4 hours, EDW 137.5 kg, EPO 28,000 units, InFeD  50/weak, PTH 30 (April 2009), FE/TIBC 22% saturation and ferritin 70  (April 2009)  Meds prior to admission Nephro-Vite one a day, Mysoline 250 t.i.d.,  Paxil 30/daily, Klonopin 1/daily, Topamax 200 b.i.d., Advair  250/50  b.i.d., MiraLax, Colace, Protonix 40 a day, PhosLo 2 t.i.d., Renagel 800  three t.i.d.   SOCIAL HISTORY:  She lives alone.  She is single.  She finished 10th  grade.  She worked at [employer].  She is currently disabled.  She does not  smoke cigarettes or drink alcohol.   FAMILY HISTORY:  Mother died at age 71 of car wreck; father died age 74,  had diabetes mellitus.  She has two brothers, one of whom died in a car  wreck.  She has no children.   REVIEW OF SYSTEMS:  She is weak, nauseated.  No angina, no claudication,  no melena, no hematochezia, no gross hematuria, no renal colic.   PHYSICAL EXAMINATION:  GENERAL:  She is weak, chronically ill, awake  and alert.  Adenoma sebaceum noted on nose, neck, and right upper  anterior chest.  VITAL SIGNS:  Temperature 99, pulse 71, respirations 20, blood pressure  115/65.  CHEST:  Cath right IJ looks clean; clear to auscultation.  HEART:  No rub.  ABDOMEN:  Scars from prior surgery, nontender.  No organs or masses  felt.  Bowel sounds present.  EXTREMITIES:  Clotted accesses  bilateral forearm and upper arms.  NEURO:  Right handed.  No focal deficits.   LABORATORY DATA:  Hemoglobin 11.4, WBC 8900, PLTs 212k, sodium 129,  potassium 8.74, chloride 111, BUN greater than 140, creatinine 18.  Chest x-ray shows no infiltrate, mild vascular congestion.   IMPRESSION:  1. End-stage renal disease with marked hyperkalemia and bradycardia      (noted in the emergency room).  2. Tuberous sclerosis, status post bilateral nephrectomy with seizure      disorder.   PLAN:  1. Dialyze, check potassium after dialysis and in the morning,      Continue EPO (Aranesp 40/week), InFeD 50/week, repeat EKG in the      a.m.  No cardiac enzymes.  Vascular access planned next week.  2. Continue anticonvulsants and p.r.n. meds.   She had bradycardia (to heart rate 20!) in the ER which responded to  calcium, glucose, and insulin; currently normal sinus  rhythm with good  blood pressure.      Richard F. Caryn Section, M.D.  Electronically Signed     RFF/MEDQ  D:  12/11/2007  T:  12/12/2007  Job:  [id number]

## 2010-11-28 NOTE — H&P (Signed)
[name], DUET NO.:  [id number]   MEDICAL RECORD NO.:  [id number]          PATIENT TYPE:  INP   LOCATION:  6707                         FACILITY:  [facility]   PHYSICIAN:  Maree Krabbe, M.D.DATE OF BIRTH:  [birth year]   DATE OF ADMISSION:  09/11/2008  DATE OF DISCHARGE:                              HISTORY & PHYSICAL   REASON FOR ADMISSION:  Shortness of breath and cough.   HISTORY:  This is a 55 year old white female with a history of asthma,  obesity, renal failure on dialysis, tuberous sclerosis with mental  retardation and seizure disorder.  She came to the emergency room today  missing her hemodialysis and was complaining of cough and abdominal pain  with coughing.  She is found to be hyperkalemic with a potassium of 5.7.  She was admitted for acute dialysis needed for hyperkalemia.  Her cough  has been ongoing for 3 weeks.  She has not taken any antibiotics, no  fever.  She does have some abdominal pain with coughing and some dyspnea  on exertion.  Denies any high fever, chills, sweats.   Denies any sore throat, difficulty swallowing, productive cough.  No  chest pain.  No vomiting or diarrhea, no dysuria, no difficulty voiding.  No joint pain or swelling.  She has some chronic arthritic pains and  takes NSAIDs and COX-2 agents as needed.  Denies any ankle swelling,  shortness of breath, orthopnea.  Denies any focal numbness or weakness.  She has a history of seizure disorder.   PAST MEDICAL HISTORY:  1. Asthma.  2. Hypertension.  3. Depression.  4. Hyperthyroid.  5. Mental retardation.  6. Tuberous sclerosis.  7. Morbid obesity.  8. Osteoarthritis.  9. Renal failure on dialysis.  10.Seizure disorder.  11.Sleep apnea.  12.History of MRSA graft infection in 2009.  13.History of major depression.  14.Multiple access procedures.  15.Internal hemorrhoids.  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  17.History of prolonged QT  syndrome, on Seroquel.  18.GERD.   PAST SURGICAL HISTORY:  Bilateral nephrectomies.   MEDICATIONS:  See orders.   ALLERGIES:  SOLU-MEDROL, IVP DYE ANY IODINE-CONTAINING  PRODUCTS.   SOCIAL HISTORY:  Lives at assisted living.  No alcohol.  I believe that  the patient does have a smoking history.   REVIEW OF SYSTEMS:  As above.   PHYSICAL EXAMINATION:  VITAL SIGNS:  Blood pressure 115/70, pulse 70,  respirations 20, temp 97.9.  GENERAL:  This patient is alert.  She is not in any severe distress.  She does have a harsh cough, which is ongoing.  SKIN:  Warm and dry with numerous subcutaneous small nodules.  HEENT:  PERRLA.  EOMI.  Throat is clear.  NECK:  Supple with flat neck veins.  CHEST:  She has some occasional scattered rhonchi.  No significant  wheezing.  No bronchial breath sounds.  CARDIAC:  Regular.  ABDOMEN:  Soft, nontender.  No organomegaly.  EXTREMITIES:  A 1+ ankle edema, not real significant.  NEUROLOGIC:  Moves all 4 extremities, knows where she is.   LABORATORY DATA:  Potassium 5.7, white blood  count 3000, hemoglobin 11,  platelets 70,000.  Sodium 127, BUN 73, creatinine 9.7, glucose 80 on I-  Stat.  Serum Chem-7, sodium 127, potassium 5.7, CO2 of 16, BUN 70, creatinine  8.7, calcium 9.0.  Chest x-ray, cardiomegaly with pulmonary vascular  congestion, possible mild atelectasis at the right base.  No strong  findings to strongly suggest pneumonia.  Lungs are hyperaerated.   IMPRESSION:  1. Hyperkalemia.  2. Bronchitis possible early pneumonia.  3. End-stage renal disease, missed dialysis today.  4. Seizure disorder, stable.  5. Mild volume excess with mild congestive heart failure.   PLAN:  Admit.  Acute dialysis tonight.  Start oral antibiotics with  Avelox.  Follow up potassium in the morning.  Follow symptoms and  possible discharge in 24-48 hours when improved.      Maree Krabbe, M.D.  Electronically Signed     RDS/MEDQ  D:  09/11/2008  T:   09/12/2008  Job:  [id number]

## 2010-11-28 NOTE — Assessment & Plan Note (Signed)
OFFICE VISIT   [name], [name]  DOB:  [birth year]                                       08/02/2009  [id number]#:[id number]   The patient was referred today by Dr. Eliott Nine for evaluation for  permanent access.  The patient is well known to our practice as she has  had multiple access procedures in both upper extremities and an infected  right thigh graft removed in the past.  She is morbidly obese and  currently has a HeRO graft in the right upper arm.  This has thrombosed  on many occasions in the past and treated with thrombolysis and  interventional radiology and most recently December 15 I performed a  thrombectomy of the graft as a final operative procedure.  This was  successful and the graft has remained patent over the last 5 weeks and  apparently has functioned well.   I reviewed her vein mapping which has been done in the office in the  past and that in conjunction with her physical exam reveal that she is  clearly not a candidate for any type of upper extremity fistula.  She  has no further access potential in either upper arm.  Her right leg has  previously had an infected graft removed.  Left inguinal area was  examined.  She has a large panniculus with pain on the left side but  does have a 2-3+ femoral pulse.  Blood pressure today is 98/59, heart  rate is 98, temperature 97.4.  There is a pulse and palpable thrill in  the right upper extremity HeRO graft.   My recommendation would be to use the HeRO graft as long as possible and  if it continues to clot on occasion to treat this with interventional  radiology and we could consider another operative thrombectomy if this  lasts a significant time period.  I would definitely not consider  inserting a left thigh graft until we are certain there are no further  options because this is likely to get infected as the right thigh graft  did.  That may become necessary in the near future but certainly  would  not do that until it is clear that that is our only option.     Quita Skye Hart Rochester, M.D.  Electronically Signed   JDL/MEDQ  D:  08/02/2009  T:  08/03/2009  Job:  [id number]

## 2010-11-28 NOTE — Op Note (Signed)
[name], [name]             ACCOUNT NO.:  [id number]   MEDICAL RECORD NO.:  [id number]          PATIENT TYPE:  AMB   LOCATION:  SDS                          FACILITY:  [facility]   PHYSICIAN:  Juleen China IV, MDDATE OF BIRTH:  21-Jun-1956   DATE OF PROCEDURE:  03/28/2007  DATE OF DISCHARGE:                               OPERATIVE REPORT   SURGEON:  1. Durene Cal IV, MD.   PREOPERATIVE DIAGNOSIS:  End-stage renal disease.   POSTOPERATIVE DIAGNOSIS:  End-stage renal disease.   PROCEDURE PERFORMED:  Creation of left upper arm Ark graft.   TYPE OF ANESTHESIA:  General.   FINDINGS:  One of her brachial veins was thrombosed.   INDICATIONS FOR PROCEDURE:  [name] is a 55 year old female with  end-stage renal disease who has previously undergone thrombectomy and  revision of a left forearm loop graft; however, this has subsequently  failed.  At that time she was found to have poor quality of her veins.  She comes back in for new graft.  The risks and benefits were discussed  and informed consent was signed.   PROCEDURE:  The patient was identified in the holding area and taken to  room 6.  She was placed supine on the table.  General endotracheal  anesthesia was administered.  A timeout was called.  Perioperative  antibiotics were administered.  Next, a longitudinal incision at the  level of the antecubital fossa was made.  Bovie cautery was used to  dissect through the subcutaneous tissue.  The brachial artery was then  exposed amidst a mild amount of scar tissue and exposed proximally and  distally for an adequate length.   Next, attention was turned towards the axilla where again a longitudinal  incision was made at the level of the hairline.  Cautery was used to go  through the subcutaneous tissue.  The groove between the biceps and  triceps muscles was identified.  The brachial artery and impaired  brachial veins were identified.  The first brachial vein  encountered  appeared to be of adequate size.  This was mobilized proximally and  distally.   Next, a subcutaneous tunnel was created with a curved tunneler.  At this  point in time, the patient was systemically heparinized.  Next, vascular  control was obtained on the brachial vein.  This was transected and the  distal end oversewn with a 2-0 silk tie.  At this point in time, it was  realized that this vein was thrombosed.  This appeared to be fresh  thrombus.  A Fogarty balloon was brought proximally without difficulty;  however, I was not satisfied with the backbleeding from this vein and  therefore elected to tie it off.  I then isolated the other brachial  vein.  This also appeared of marginal caliber.  It was isolated  proximally and distally and then transected.  An end-to-end anastomosis  was then created using a 6-0 Prolene suture and a 6-mm Gore graft.  Following completion of the anastomosis, clamps were released.  There  was good venous bleeding from the graft.   Next,  we set up for the arterial anastomosis.  After vascular control  was obtained, a #11 blade was used to open the artery.  This was  extended with Potts scissors.  A running anastomosis was created using a  6-0 Prolene.  Following completion of the anastomosis, the brachial  artery was flushed in antegrade and retrograde fashion.  The anastomosis  was then completed.  The clamps were released.  There was a thrill  within the graft; however, I was hoping to be able to feel it better.  For that reason, I elected to open the distal end of the graft at the  venous end.  This was done with a #11 blade.  A #4 Fogarty was easily  passed centrally and withdrawn without resistance.  A #4 dilator was  easily passed across the anastomosis.  After this, this was closed with  running 6-0 Prolene.  There was a thrill within the graft.  The  subcutaneous tissue was copiously irrigated.  The subcutaneous tissue  was  reapproximated with interrupted 3-0 Vicryl.  The skin was closed  with a running 4-0 Vicryl.  Dermabond was placed.  She had a Doppler  radial signal at the end of the case.      Jorge Ny, MD  Electronically Signed     VWB/MEDQ  D:  03/28/2007  T:  03/29/2007  Job:  [id number]

## 2010-11-28 NOTE — Op Note (Signed)
[name], [name] NO.:  [id number]   MEDICAL RECORD NO.:  [id number]          PATIENT TYPE:  OIB   LOCATION:  5523                         FACILITY:  [facility]   PHYSICIAN:  Quita Skye. Hart Rochester, M.D.  DATE OF BIRTH:  Jul 27, 1955   DATE OF PROCEDURE:  12/27/2006  DATE OF DISCHARGE:                               OPERATIVE REPORT   PREOPERATIVE DIAGNOSIS:  Thrombosis of the left forearm arteriovenous  Gore-Tex graft placed earlier today.   POSTOPERATIVE DIAGNOSIS:  Thrombosis of the left forearm arteriovenous  Gore-Tex graft placed earlier today.   OPERATION:  Thrombectomy arteriovenous Gore-Tex graft left forearm with  insertion of a new segment of graft from existing graft to brachial  vein.   SURGEON:  Quita Skye. Hart Rochester, M.D.   FIRST ASSISTANT:  Nurse.   ANESTHESIA:  General.   PROCEDURE:  The patient was taken to the operating room, placed in the  supine position at which time satisfactory general endotracheal  anesthesia was administered.  The left arm was prepped with Betadine  scrub and solution and draped in routine sterile manner.  The previous  transverse incision antecubital area was reopened.  The patient was  given 3000 inches heparin.  Transverse opening was made in the graft  spot just proximal to the venous anastomosis to the brachial vein.  Graft itself was easily thrombectomized with a 5 Fogarty catheters with  excellent inflow being reestablished.  Although the vein was 4.5 to 5 mm  in size, there was some very mild tapering at the distal tip of the  anastomosis.  This was easily thrombectomized.  A 5 dilator would  traverse this and it was not clear whether this was because of the  thrombosis or not.  Since no other pulse could be determined it was  decided to revise this to slightly higher on the vein.  Therefore a new  piece of 6 mm graft was anastomosed end-to-end to the old graft and end-  to-end to a more proximal site on the vein after  making a short incision  about 5 cm more proximally and dissecting the vein out in this area.  This was done end-to-side fashion.  Clamps released.  There was an  excellent pulse and thrill in the graft.  No protamine was given.  The  wound was irrigated with saline, closed in layers with Vicryl  subcuticular fashion.  Sterile dressing applied.  The patient taken to  recovery in satisfactory condition.      Quita Skye Hart Rochester, M.D.  Electronically Signed     JDL/MEDQ  D:  12/27/2006  T:  12/28/2006  Job:  [id number]

## 2010-11-28 NOTE — Assessment & Plan Note (Signed)
OFFICE VISIT   [name], [name]  DOB:  1956/02/01                                       05/31/2008  ZOXWR#:[id number]   HISTORY:  This is a 55 year old female that presented with an infected  left brachial artery pseudoaneurysm.  This was on May 15, 2008.  She  underwent resection of her pseudoaneurysm and patch angioplasty of her  left brachial artery.  She was in the hospital intubated for several  days.  Ultimately, she was transferred to the floor where she continued  to recover.  She had some social reasons that kept her in the hospital  several extra days.  She has continued to get antibiotics.  Her cultures  were negative.   On examination, the patient's sutures are still in place.  There is  minimal erythema.  There is some skin breakdown over the course of the  incision which exposed brachial artery.  Her sutures were removed in  clinic today.  She will need to have dressing changes to the denuded  area of skin.  I am going to have her come back to see me in 2 weeks for  a wound check.   Jorge Ny, MD  Electronically Signed   VWB/MEDQ  D:  05/31/2008  T:  06/01/2008  Job:  [id number]   cc:   [employer]

## 2010-11-28 NOTE — Assessment & Plan Note (Signed)
OFFICE VISIT   [name], [name]  DOB:  1956/01/19                                       06/27/2007  NFAOZ#:[id number]   The patient presents today for concern regarding her right arm.  She has  had a new right arm graft placed earlier this week by Dr. Hart Rochester, and  she is concerned regarding the discomfort.   PHYSICAL EXAM:  She does have a good bruit in the graft.  She has the  usual amount of erythema with the new graft placed.  She has not had any  evidence of steal.  I have reassured her with this and she will continue  to have this checked at the dialysis center.  She had an appointment set  for Dr. Hart Rochester on 12/23 and I have told her she can cancel this, since I  have seen her today, and she will notify should she have any difficulty  before further followup with Dr. Hart Rochester.   Larina Earthly, M.D.  Electronically Signed   TFE/MEDQ  D:  06/27/2007  T:  06/30/2007  Job:  [id number]   cc:   Quita Skye. Hart Rochester, M.D.

## 2010-11-28 NOTE — Discharge Summary (Signed)
NAMEKESSA, FAIRBAIRN NO.:  [id number]   MEDICAL RECORD NO.:  [id number]          PATIENT TYPE:  OUT   LOCATION:  XRAY                         FACILITY:  [facility]   PHYSICIAN:  Renee Ramus, MD       DATE OF BIRTH:  Mar 12, 1956   DATE OF ADMISSION:  10/06/2008  DATE OF DISCHARGE:  10/06/2008                               DISCHARGE SUMMARY   PRIMARY DISCHARGE DIAGNOSIS:  Systemic inflammatory response syndrome  and sepsis.   SECONDARY DIAGNOSES:  1. End-stage renal disease, hemodialysis dependent.  2. Anemia of chronic disease.  3. Epilepsy.  4. Sleep apnea.  5. Thrombocytopenia.   HOSPITAL COURSE BY PROBLEM:  Systemic inflammatory response syndrome and  sepsis.  The patient is a 55 year old female who was admitted with  fevers, chills, increased respiratory rate, and hypotension as well as  hypoxemia.  She was placed in Intensive Care Unit.  She recovered.  She  was placed back on the floor.  The patient then left against medical  advice abruptly.  We were not able to contract discharge medications or  proper discharge summary.  The patient has followed up with Nephrology  as an outpatient and appears to be doing well.  I had discussions with  nursing prior she was asked not to leave the building, but she did so  anyway.  There are no pending labs or studies at the time of discharge.  The patient does have a history of mental retardation with paranoid  schizophrenia, and she is following up with Psychiatry.  She was seen by  a psychiatrist in the hospital.  This is a dictation summary in  retrospection following her abrupt departure against medical advice.   TIME SPENT:  35 minutes.      Renee Ramus, MD  Electronically Signed     JF/MEDQ  D:  10/11/2008  T:  10/12/2008  Job:  [id number]

## 2010-11-28 NOTE — H&P (Signed)
NAMERAYMONA, BOSS NO.:  [id number]   MEDICAL RECORD NO.:  [id number]          PATIENT TYPE:  OIB   LOCATION:  5523                         FACILITY:  [facility]   PHYSICIAN:  Maree Krabbe, M.D.DATE OF BIRTH:  [birth year]   DATE OF ADMISSION:  12/27/2006  DATE OF DISCHARGE:                              HISTORY & PHYSICAL   REASON FOR ADMISSION:  Pain control after AV graft placement and  unsafe/poor social situation.   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.  She had a left upper arm fistula that developed a  stenosis and occlusion of the central cephalic vein.  Also had  pseudoaneurysms.  She was admitted for a new left arm access, and had  that done today, left lower arm AV graft placed as well as a right IJ  Diatek catheter placed.  The graft developed a clot in the PACU and she  had to have a thrombectomy.  She lives alone and says that she does not  have any help at home and cannot go home and will not be able to take  care of herself.  She says she does have home health during the week,  Monday through Friday, but nobody on the weekends.   MEDICATIONS:  1. Renagel, three with meals.  2. PhosLo, two with meals.  3. Klonopin 1 mg at nighttime.  4. Primidone 300 b.i.d.  5. Nexium 40 q.h.s.  6. EPO 26,000 units each dialysis.  7. InFeD 50 a week.  8. Allegra 60 b.i.d.  9. Keppra 500 b.i.d.  10.Nephrovite one a day.  11.Topamax 200 b.i.d.  12.Zoloft 50 mg a day.   PAST MEDICAL HISTORY:  1. 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  2. Bilateral history of angiomyosarcoma.  3. Small bowel repair at [facility] in 2007.  4. Seizure disorder.  5. End-stage renal disease.  6. Hypertension.  7. Obesity.  8. Depression.  9. Asthma.  10.Anemia.  11.Cholecystectomy.  12.Obstructive sleep apnea.  13.Mild mental retardation.  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   SOCIAL HISTORY:  Lives at home.  No children.  No alcohol.  No smoking.  Otherwise, as above.   REVIEW OF SYSTEMS:  Positive for chronic anxiety, occasional headache.  No cough, abdominal pain, chest pain, nausea, vomiting.   PHYSICAL EXAMINATION:  VITAL SIGNS:  Blood pressure is 130/40,  temperature 98.8, O2 sat 95% on room air.   LABORATORY DATA:  White blood count 4000, hemoglobin 11, potassium 5.7  earlier this morning before dialysis.  BUN 48, creatinine 8.4, calcium  8.5, phosphorus 8.6.   IMPRESSION:  1. Status post left forearm AV graft and new Diatek catheter.  2. Poor social situation, with inadequate care at home.  3. Pain, secondary to number one.  4. End-stage renal disease.  5. Tuberous sclerosis.  6. Seizure disorder.  7. Mild mental retardation.  8. Hypertension.  9. Depression and anxiety.  10.Asthma.  11.Obesity.   PLAN:  I discussed with the case managers.  They have no way to arrange  help for [name] at home this weekend.  We will admit to the  hospital.      Maree Krabbe, M.D.  Electronically Signed     RDS/MEDQ  D:  12/28/2006  T:  12/28/2006  Job:  [id number]

## 2010-11-28 NOTE — Assessment & Plan Note (Signed)
OFFICE VISIT   TERYN, BOEREMA  DOB:  [birth year]                                       06/18/2007  ZOXWR#:[id number]   Patient presents today for evaluation for new hemodialysis access.  She  had a right renal artery to cephalic vein fistula created by Dr. Hart Rochester  on November 5th.  This has been occluded for some time, and she does not  know the exact date that this stopped working.  She is currently  dialyzing via Diatek catheter.  She has previously had a left forearm  and left upper arm graft, both of which lasted a relatively short  period.   PHYSICAL EXAMINATION:  Blood pressure is 165/99, pulse 100 and regular.  Right arm shows an occluded right Cimino fistula with a healing  incision.  The left arm has multiple grafts, which are all healed and  occluded.   Her vein mapping ultrasound dated May 16, 2007 showed the cephalic  vein in her upper arm was between 53 and 33 mm in diameter.  I spoke  with her about the benefits of placing a forearm graft versus a fistula.  She has opted for a fistula at this time; however, this may be fairly  deep due to her obese upper arm.  I discussed with her issues of  durability versus a graft, and she prefers the fistula if possible.  We  will place this on June 23, 2007.  If the vein is marginal or  occluded at the time of exploration, we will place a forearm graft.   Janetta Hora. Fields, MD  Electronically Signed   CEF/MEDQ  D:  06/18/2007  T:  06/19/2007  Job:  [id number]

## 2010-11-28 NOTE — Assessment & Plan Note (Signed)
OFFICE VISIT   [name], [name]  DOB:  06-03-1956                                       08/13/2008  ZOXWR#:[id number]   Patient presents today for evaluation of AV access.  She has a very  long, complicated history of vascular access.  She does currently have a  right femoral Diatek catheter.  She had multiple upper arm grafts in the  past, most recently having complication of pseudoaneurysm in her left  brachial artery.  She is seen today for discussion of thigh graft.  She  does have a palpable dorsalis pedis pulse on the left.  She does have  morbid obesity.   I discussed the options at length with patient.  I have recommended  consideration for a Hero graft.  I reviewed her prior sonograms.  Most  recent on the right was from 08/14/07, which showed a patent subclavian  vein at that time.  I feel that she would have a lesser degree of  infectious complications and other difficulties with this hybrid graft  versus a leg graft.  She would be a candidate for a leg graft if  necessary.  We will schedule this at her earliest convenience on a  nondialysis day.   Larina Earthly, M.D.  Electronically Signed   TFE/MEDQ  D:  08/13/2008  T:  08/16/2008  Job:  [id number]   cc:   [facility] Salvia. Eliott Nine, M.D.

## 2010-11-28 NOTE — Op Note (Signed)
[name], DRONE NO.:  [id number]   MEDICAL RECORD NO.:  [id number]          PATIENT TYPE:  OIB   LOCATION:  2550                         FACILITY:  [facility]   PHYSICIAN:  Quita Skye. Hart Rochester, M.D.  DATE OF BIRTH:  [birth year]   DATE OF PROCEDURE:  12/27/2006  DATE OF DISCHARGE:                               OPERATIVE REPORT   PREOPERATIVE DIAGNOSIS:  End stage renal disease.   POSTOPERATIVE DIAGNOSIS:  End stage renal disease.   OPERATION:  1. Ligation of left upper arm arteriovenous fistula with insertion of      left forearm arteriovenous graft (4 mm - 7 mm stretch) brachial      artery to basilic vein (4.5 mm).  2. Bilateral ultrasound localization of internal jugular veins.  3. Insertion Diatek catheter via right internal jugular vein (28 cm).   SURGEON:  Quita Skye. Hart Rochester, M.D.   FIRST ASSISTANT:  Nurse.   ANESTHESIA:  General.   PROCEDURE:  The patient was taken to the operating room and placed in a  supine position at which time satisfactory general endotracheal  anesthesia was administered.  The left arm was prepped with Betadine  scrub solution and draped in a routine sterile manner.  A transverse  incision was made through the previous scar in the antecubital area  where the fistula had been created in the upper arm.  The cephalic vein  was occluded centrally and the fistula was not revisable.  The  anastomosis between cephalic vein and brachial artery was completely  dissected free, proximal and distal control, and the basilic vein was  noted medially which was 4.5 to 5 mm in size.  A loop shaped tunnel was  then created after making a small counterincision at the apex the loop  and a 4 x 7 mm stretch Gore-Tex graft delivered through the tunnel.  No  heparin was given.  The artery was occluded proximally and distally with  vessel loops.  The previous fistula was ligated and transected with the  vein being excised from its anastomosis to the  brachial artery.  The 4  mm end of the graft was spatulated and anastomosed end-to-side with 6-0  Prolene to the artery and end-to-side with 6-0 Prolene to the vein which  was 4.5 mm in size.  Clamps were then released and there was an  excellent pulse and thrill in the graft and good ulnar artery flow at  the wrist.  The wounds were closed in layers with Vicryl in a  subcuticular fashion.   Attention was turned to the upper chest and neck which was exposed. Both  internal jugular veins were imaged using B-mode ultrasound, the right  was the largest.  After prepping and draping in a routine sterile  manner, the right internal jugular vein was entered using a  supraclavicular approach.  A guidewire was passed into the inferior vena  cava under fluoroscopic guidance.  After dilating the tract  appropriately, a 28 cm Diatek catheter was positioned in  the right atrium, tunneled peripherally, secured with nylon sutures, and  the wound closed with Vicryl in  a subcuticular fashion.  A sterile  dressing was applied.  The patient was taken to the recovery room in  satisfactory condition.      Quita Skye Hart Rochester, M.D.  Electronically Signed     JDL/MEDQ  D:  12/27/2006  T:  12/27/2006  Job:  [id number]

## 2010-11-28 NOTE — Consult Note (Signed)
NAME:  [name], Stone NO.:  [id number]   MEDICAL RECORD NO.:  [id number]          PATIENT TYPE:  INP   LOCATION:  6730                         FACILITY:  [facility]   PHYSICIAN:  Bernette Redbird, M.D.   DATE OF BIRTH:  16-Feb-1956   DATE OF CONSULTATION:  DATE OF DISCHARGE:  03/22/2008                                 CONSULTATION   [name] is a 55 year old female with multiple severe medical problems,  not the least of them being renal disease with loss of both kidneys for  which she is on hemodialysis, plus mental retardation leading to poor  compliance with her dialysis regimen, and an episode of bradycardic  arrest due to hyperkalemia last month.   She has chronic anemia, with hemoglobins typically in the 7 range, which  is what she presented to the hospital with this time, about a week ago,  with malaise and shortness of breath.  She has been judged incompetent  by the psychiatrist to provide consent.   With that background, the patient has had problems with constipation for  which she uses MiraLax fairly effectively at home, but was switched to  sorbitol when she came in the hospital.  She has seen some bright red  blood turning the toilet water red here in the hospital.  Of note, she  had colonoscopy 2 years ago (October 2007) by Dr. Randa Evens for heme  positivity at that time, and although the prep was suboptimal, it  appears he had a fairly adequate exam and the only abnormality  identified was internal hemorrhoids.  She also had a negative endoscopy  on that occasion.   More recently, the patient's stool was hemoccult negative on March 10, 2008.   The patient has received 2 units of packed cells on this admission, and  her hemoglobin has risen to 8.8.   PAST MEDICAL HISTORY:  Allergy to TRIMETHADIONE, PREDNISOLONE, and  CONTRAST MEDIA.   Medications in the hospital include PhosLo, Topamax, Renagel, aspirin,  Claritin, vitamins, Protonix, Aranesp,  Cymbalta, Mysoline, Advair,  Flonase, Benadryl, Anusol Suppository, IV iron, and various p.r.n.'s.   OPERATIONS:  Bilateral nephrectomy and multiple graft operations.   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   FAMILY HISTORY:  Negative, to my knowledge, for any colorectal  neoplasia.   SOCIAL HISTORY:  The patient lives alone, nonsmoker, and nondrinker.   REVIEW OF SYSTEMS:  Diffuse abdominal pain which she attributes to GERD,  helped by Nexium when she is at home.  MiraLax works pretty well for her  constipation.   PHYSICAL EXAMINATION:  GENERAL:  Substantially obese female who seems  appropriate and coherent, not paranoid or uncooperative.  HEENT:  She is anicteric and without frank pallor.  CHEST:  Clear.  HEART:  Normal.  ABDOMEN:  Severely obese, but without overt tenderness appreciated.   LABORATORY DATA:  Hemoglobin today is 8.8 with MCV of 100, platelets  121,000, and white count 5000.  Liver chemistries not checked on  this  admission.  Ferritin level is elevated at 448 and iron saturation is  41%.   IMPRESSION:  1. Chronic anemia with recently heme-negative stool and normal iron      studies suggesting that the anemia is not due to iron deficiency      although she has received IV iron on this hospitalization.  2. Small-volume hematochezia most consistent with hemorrhoidal origin,      status post negative colonoscopy by Dr. Randa Evens 2 years ago.   DISCUSSION AND PLAN:  I think it would be reasonable to do a  sigmoidoscopy on this admission to rule out gross distal colorectal  pathology such as neoplasia or proctitis (doubt).  Especially with her  recent constipation, I would strongly suspect hemorrhoids.  I have  discussed the procedure briefly with the patient and she  is agreeable;  we will plan to do it unprepped so as to get a better idea of the  presence or absence of blood within the rectosigmoid lumen.  I will  attempt to contact the patient's power of attorney, Christinia Gully  [phone]) although at the present time I am only able to reach an  answering machine.           ______________________________  Bernette Redbird, M.D.     RB/MEDQ  D:  03/22/2008  T:  03/23/2008  Job:  [id number]   cc:   Aram Beecham B. Eliott Nine, M.D.  Stacie Glaze, MD

## 2010-11-28 NOTE — Discharge Summary (Signed)
[name], [name] NO.:  [id number]   MEDICAL RECORD NO.:  [id number]          PATIENT TYPE:  INP   LOCATION:  2108                         FACILITY:  [facility]   PHYSICIAN:  Mindi Slicker. Lowell Guitar, M.D.  DATE OF BIRTH:  [birth year]   DATE OF ADMISSION:  12/11/2007  DATE OF DISCHARGE:  12/12/2007                               DISCHARGE SUMMARY   DISCHARGE DIAGNOSES:  1. Hyperkalemia secondary to missed hemodialysis treatments.  2. Bradycardia to the 20s, requiring resuscitation secondary to      hyperkalemia  3. History of tuberous sclerosis with adenoma sebaceums.  4. Bilateral nephrectomy.  5. Seizure disorder.  6. End-stage renal disease on HD.  7. Anemia of chronic kidney disease.  8. Secondary hyperparathyroidism.  9. Poor vascular access.  10.History of methicillin-susceptible Staphylococcus aureus bacteremia      secondary to a catheter infection.   DISCHARGE MEDICATIONS:  1. Topamax 200 mg p.o. b.i.d.  2. PhosLo 667 mg two tablets 3 times a day with meals.  3. Klonopin 1 mg p.o. nightly.  4. Protonix 40 mg p.o. daily.  5. Advair 1 puff p.o. b.i.d.  6. Paxil 30 mg p.o. daily.  7. Primidone 250 mg three times daily.  8. Colace twice daily.  9. MiraLax 17 grams in 4 ounces of water daily.  10.Celebrex 200 mg p.o. b.i.d.  11.Renagel three tablets, three times daily with meals.  12.Nephro-Vite one daily.   DIET ORDERS:  Renal diet and 1200 ml fluid restriction.   ACTIVITIES:  As tolerated.   DIALYSIS INFORMATION:  The patient is dialysis patient at [facility],  Tuesday, Thursday, and Saturday with a duration of 4 hours and 15  minutes.  Estimated dry weight of 137.5 kg; access is a right subclavian  PermCath with plans for an AV fistula to be placed in June 2009.  Standard Epogen is 28,000 units with each dialysis.  INFeD 50 mg every  Thursday, hemodialysis.  Blood for each 400 dialysis, flow rate is 800.   DISCHARGE LABORATORIES:  Hemoglobin of 10.8.   Potassium of 4.6 and  calcium of 10.1.   PROCEDURES:  The patient underwent hemodialysis on the day of admission.  [name] presented 10 kg over her dry weight and 7.4 kg were removed in  dialysis.   BRIEF HOSPITAL COURSE:  [name] is a 55 year old female, who had  missed 1 week of dialysis because of disagreement with a dialysis check,  and [name] came to the ER on the day of admission feeling weak and  nauseated.  Her potassium was found to be 8.7.  [name] was to be sent  immediately to dialysis for treatment.  However, an hour later, [name] had  a heart rate of 20 per minute.  Poorly palpable pulses and was given  calcium, glucose, and sodium bicarbonate, but no Kayexalate.  Her EKG  showed a wide QRS complex and peak T-waves.  [name] was stabilized and  taken to the MICU for emergency dialysis. [name] was 10 kg above her dry  weight and had potassium of 8.7.  [name] underwent dialysis during which  7.4  liters was removed and postdialysis potassium was 4.6.  [name] remained  stable this morning with a potassium of 4.6, just complaining of some  chronic pain from her abdominal hernia but is stable for discharge, to  return to her dialysis center tomorrow on Saturday for her regular  dialysis schedule.  [name] had a long discussion with Dr. Lowell Guitar and  reports no problems at her dialysis center and is agreeable to keeping  the remainder of her dialysis appointment, understanding that due to her  missing her week's appointment, [name] almost died.      Levander Campion, M.D.  Electronically Signed      Mindi Slicker. Lowell Guitar, M.D.  Electronically Signed    JH/MEDQ  D:  12/12/2007  T:  12/13/2007  Job:  [id number]

## 2010-11-28 NOTE — Op Note (Signed)
NAMESINCERE, BERLANGA             ACCOUNT NO.:  [id number]   MEDICAL RECORD NO.:  [id number]          PATIENT TYPE:  INP   LOCATION:  2550                         FACILITY:  [facility]   PHYSICIAN:  Juleen China IV, MDDATE OF BIRTH:  Aug 18, 1955   DATE OF PROCEDURE:  05/15/2008  DATE OF DISCHARGE:                               OPERATIVE REPORT   PREOPERATIVE DIAGNOSIS:  Infected left upper arm arteriovenous Gore-Tex  graft.   POSTOPERATIVE DIAGNOSIS:  Infected left upper arm arteriovenous Gore-Tex  graft.   PROCEDURE PERFORMED:  1. Patch angioplasty, left brachial artery with bovine pericardial      patch.  2. Resection of the brachial artery pseudoaneurysm.   TYPE OF ANESTHESIA:  General.   BLOOD LOSS:  1.2 liters.   FINDINGS:  A 6-cm infected pseudoaneurysm arising off of the brachial  artery.   SPECIMENS:  Tissue for culture.   DRAINS:  15 Blake drain.   INDICATIONS:  This is a 55 year old female with multiple left arm access  procedures in the past, who has been [facility] with  antibiotics for cellulitic-appearing left arm graft.  She recently had  positive blood cultures, Gram positive cocci and she comes in today with  worsening appearance of her left arm.  On clinical examination, there is  a large pseudoaneurysm which is infected.  The patient was taken from  the ER to the operating room.   PROCEDURE:  The patient was identified in the holding area and taken to  the operating room and placed supine on the table.  General endotracheal  anesthesia was administered.  The left arm was prepped and draped in the  standard sterile fashion.  A time-out was called.  Antibiotics were  given.  I first began making a longitudinal incision above the  antecubital crease near the patient's previous incisions.  The brachial  artery was first isolated proximal to the pseudoaneurysm area.  It was  fully mobilized and encircled with a Vesseloops.  I then exposed  the  brachial artery distal to the pseudoaneurysm and encircled it with a  Vesseloops.  The patient has had multiple grafts in this area, therefore  there was very dense scar tissue.  There were multiple old grafts in  this area as well.  The brachial artery was approximately 2 mm in  diameter.  Once I felt I had control proximal and distal to the  pseudoaneurysm, I began trying to dissect the pseudoaneurysm out.  The  patient had severe venous hypertension and multiple large venous  collaterals.  She had significant bleeding from the raw surface of her  scar tissue.  I was able to get around the graft and the pseudoaneurysm  and place it in a  Vesseloops.  I then elected to transect the graft and  pseudoaneurysm with the intent of working from the inside out.  However,  there was still arterial bleeding from the pseudoaneurysm, therefore,  the pseudoaneurysm was clamped.  I began fully mobilizing the brachial  artery.  Again, this was significantly incorporated to the surrounding  scar tissue as well as the  multiple graft in this area.  Ultimately, I  was able to skeletonize the brachial artery ensuring that I could obtain  hemostasis with this.  The clamp on the pseudoaneurysm was released.  The pseudoaneurysms were resected off the brachial artery and measured  approximately 6-7 cm in size.  The edges of the brachial artery were  then trimmed with tenotomy scissors back to healthy tissue.  I selected  the bovine pericardial patch to perform patch angioplasty of the left  brachial artery.  This was done using a running 6-0 Prolene.  Please  note that once I had clamped the brachial artery, I did give the patient  systemic heparinization, which was redosed after 1 hour of clamp time.  Once the patch was completed, I flushed the artery in an antegrade and  retrograde fashion and there was good backbleeding and antegrade  bleeding.  Anastomosis was then secured and the clamps were  released.  The patient had a good Doppler signal across the patch.  She also had a  Doppler signal in her radial and ulnar artery.  At this point, I elected  to give the patient 50 mg of protamine.  I then made an incision in the  mid upper arm over the course of the graft.  The graft in this area was  well incorporated.  Due to the significant cavity that the  pseudoaneurysm had created once it was resected, I was able to remove  the graft from the mid upper arm down to the brachial artery.  For the  mid upper arm proximally, the graft was well incorporated and I elected  not to resect the remaining portion of the graft.  It was dissected back  to the limits of the incision and then ligated with a 2-0 silk tie and  the graft was then discarded.  At this point, the wound was copiously  irrigated.  I was able to close the muscle over the brachial artery,  repaired with interrupted 3-0 Vicryl.  I then closed the deep tissue  with a single layer of 3-0 Vicryl.  Nylon sutures were placed on the  skin and longitudinal incision at the antecubital crease.  Incision in  the mid upper arm was closed with 3-0 Vicryl and then 3-0 nylon was used  to close the skin.  I did place a 15 flat Blake drain in the incision  which was secured into place with 3-0 nylon.  Due to the patient's  transfusion problems during the case, we elected to keep her intubated.  Once the sterile dressings were applied, she would be taken to the ICU  in guarded condition.           ______________________________  V. Charlena Cross, MD  Electronically Signed     VWB/MEDQ  D:  05/16/2008  T:  05/16/2008  Job:  [id number]

## 2010-11-28 NOTE — Consult Note (Signed)
[name], [name] NO.:  [id number]   MEDICAL RECORD NO.:  [id number]          PATIENT TYPE:  INP   LOCATION:  5024                         FACILITY:  [facility]   PHYSICIAN:  Jake Bathe, MD      DATE OF BIRTH:  1956/06/27   DATE OF CONSULTATION:  09/22/2007  DATE OF DISCHARGE:  09/22/2007                                 CONSULTATION   REFERRING PHYSICIAN:  Garnetta Buddy, M.D.   REASON FOR CONSULTATION:  [name] is being seen at the request of  Dr. Hyman Stone for the evaluation of chest pain.   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  She has  complained of chest pain since the hemodialysis catheter placement in  the right subclavian vein in January 2009.  Tender to touch.  This was  slightly different in that she was a little bit more short of breath.  Chest pain can occur while sitting up, lying down, and getting ready for  bed.  No specific alleviating factors.  Most of the time, it lasts  constantly through the day, radiates from the right port throughout the  left chest.   PAST MEDICAL HISTORY:  1. Tuberous sclerosis.  2. End-stage renal disease on hemodialysis, Tuesday, Thursday, and      Saturday.  3. Nephrectomy.  4. Angiomyosarcoma.  5. Cholecystectomy.  6. Seizure disorder - Petit mal - secondary hyperparathyroidism.  7. Anemia of chronic disease.  8. Depression.  9. Asthma.  10.Anemia.  11.Obstructive sleep apnea.  12.Morbid obesity.  13.Hypertension.   MEDICATIONS:  1. Nephro-Vite.  2. Topamax 200 twice a day.  3. PhosLo 667 mg 3 times a day.  4. Clonazepam p.r.n.  5. Protonix 80 mg a day.  6. Advair 250/50 twice a day.  7. Paxil 30 mg at night.  8. Renagel 2400 mg 3 times a day.  9. Mysoline 300 mg twice a  day.  10.Percocet p.r.n.   ALLERGIES:  TRIMETHADIONE and METHYLPREDNISOLONE.  No contrast allergy.   FAMILY HISTORY:  Diabetes, hypertension, cancer, and coronary artery  disease.  No early coronary artery disease.   SOCIAL HISTORY:  Denies any smoking, drinking, or drug use.   REVIEW OF SYSTEMS:  Seizures, aura, shortness of breath, and chest pain.  Unless specified above, all other 12 review of systems negative.   PHYSICAL EXAMINATION:  Temperature 98.7, blood pressure 121/72 to 99/67,  pulse 69 to 92, respirations 18, and saturating 99% on room air.  GENERAL: Alert and oriented x3 in no acute distress, pleasant.  EYES: Pale conjunctivae.  EOMI.  No scleral icterus.  NECK: Multiple skin tags likely associated with tuberous sclerosis.  No  carotid bruits.  No JVD.  CARDIOVASCULAR: Regular rate and rhythm.  No murmurs, rubs, or gallops.  Distant heart sounds.  LUNGS: Clear to auscultation bilaterally.  No wheezes.  No rales.  ABDOMEN: Obese.  Positive bowel  sounds.  Nontender.  No bruits.  EXTREMITIES: No lower extremity edema.  Difficult to palpate distal  pulses, thick.  SKIN: Skin tags as described above.  NEUROLOGIC: Nonfocal.  Did not assess gait.  No tremors noted.   DATA:  EKG shows sinus rhythm with first-degree AV block, left axis  deviation, and right bundle-branch block.  R-wave progression is  abnormal.  When compared to prior ECG, there is no specific change other  than T-waves are now inverted in lead III and aVF, fairly nonspecific.   LABS:  Troponin normal.  Point of care markers normal.  Potassium 4.8  and creatinine 7.3.  Liver function is normal.  White count 3,  hemoglobin 8, hematocrit 24, and platelets 146,000.  Paramedical records  reviewed.   ASSESSMENT/PLAN:  A 55 year old female with morbid obesity on  hemodialysis with tuberous sclerosis with chest pain.  1. Chest pain - fairly atypical and does seem to correlate with prior      hemodialysis  catheter placement use.  Does not appear to be      exertional.  There is some mild associated dyspnea with this.      Given her multiple risk factors, a 2D echocardiogram will be      performed to ensure that there are no significant structural      abnormalities of the heart.  Note that EKG does show poor R-wave      progression, this will be helpful to detect any wall motion      abnormalities.  In addition, she is getting a V/Q scan by her      primary team secondary to contrast allergy.  Not unreasonable to      rule out PE, may be difficult with her body habitus.  She does seem      to be quite comfortable currently.  EKG is relatively unchanged      from prior.  Cardiac biomarkers are also reassuring.  After      reviewing echocardiogram and V/Q scan, if both are unremarkable, I      order nuclear stress testing to rule out any signs of obvious      ischemia.  2. Obesity - encourage weight loss.  3. Chronic kidney disease, on dialysis - stable.  4. Pancytopenia/anemia - being followed.  5. Seizure disorder - stable on Topamax.  Dr. Nash Shearer has seen on an      inpatient setting.  We will follow up with studies as mentioned      above.      Jake Bathe, MD  Electronically Signed     MCS/MEDQ  D:  09/22/2007  T:  09/23/2007  Job:  [id number]   cc:   Garnetta Buddy, M.D.

## 2010-11-28 NOTE — Discharge Summary (Signed)
[name], [name]             ACCOUNT NO.:  [id number]   MEDICAL RECORD NO.:  [id number]          PATIENT TYPE:  INP   LOCATION:  6740                         FACILITY:  [facility]   PHYSICIAN:  James L. Deterding, M.D.DATE OF BIRTH:  06-Apr-1956   DATE OF ADMISSION:  08/05/2007  DATE OF DISCHARGE:  08/08/2007                               DISCHARGE SUMMARY   DISCHARGE DIAGNOSES:  1. End-stage renal disease.      a.     Clotted access with procedure difficulties.      b.     Hyperkalemia.  2. Seizure disorder.  3. Secondary hyperparathyroidism.  4. Anemia of chronic disease.   PROCEDURES:  1. August 05, 2007:  Attempted access of left external jugular vein      with right jugular venogram and placement of nontunneled central      venous catheter left femoral vein.  Impression:  (1) Inability to      place temporary dialysis catheter via both right and left external      jugular vein.  Attempt at catheter placement did include a formal      venogram of the right external jugular vein.  BOTH INTERNAL JUGULAR      VEINS ARE OCCLUDED IN THE NECK.  (2) Placement of Vas-Cath via left      common femoral vein.  2. August 06, 2007:  Attempted declot of the right upper arm graft      complicated by rupture of the venous anastomosis.  The vein could      not be repaired, and therefore the graft was initially occluded      using angioplasty balloon.  At the end of the procedure a balloon      was left in place as the patient was transferred back to the floor,      but there was no obvious flow to the graft at the end of the      procedure.  In addition, the patient had no significant hematoma      formation in the right arm.  Plan as outlined by Dr. Lowella Dandy was to      return in approximately 24 hours for removal of the balloon and      attempt to place a right subclavian dialysis catheter.  3. August 07, 2007:  Placement of HemoSplit permanent catheter in      right subclavian lying in the  right atrium.  Removal of groin      catheter.  Without complications by Dr. Richarda Overlie.   CONSULTATIONS:  1. Interventional radiology.  2. Vascular vein surgery.   HISTORY OF PRESENT ILLNESS:  [name] is a 55 year old white female  with end-stage renal disease who presented to outpatient hemodialysis  with a clotted AVG and a potassium of 6.8.  She was in radiology for  declot and not able to place an IJ in the left or the right.  Therefore,  a left femoral vascular catheter was placed and the plan was to proceed  with thrombolysis in the a.m.   ADMISSION LABORATORY:  Potassium 6.8.   HOSPITAL  COURSE:  1. End-stage renal disease.      a.     Clotted access with end-stage access per [facility].      b.     Hyperkalemia:  The patient was admitted.  She had a       temporary Vas-Cath in place as performed by interventional       radiology.  She was dialyzed date of admission on a 0K bath       initially, with a potassium rechecked and then changed to the       appropriate bath.  She did well overnight.  Potassium in the low       4's during the night but 5.9 the a.m. of January 21.  Patient       scheduled to proceed to interventional radiology for an attempt at       a thrombolysis.  However see procedures' above difficulty as both       internal jugulars were occluded and collapse noted in the AVG with       critical stenosis at the venous anastomosis.  Decided by Dr. Lowella Dandy       to keep balloon in place overnight, redialyze for a high       potassium, and then attempt for a tunneled subclavian catheter in       the a.m.  We obtained a consultation from [facility] on January 21       because of difficulties with patient's graft.  Recommendations       were to reattempt thrombolysis using the balloon over 24 hours and       then attempt insertion of Diatek in the right subclavian.  Noted       by [facility] that both IJs are occluded and patient noted to be at end-       stage access situation.  [facility]  did feel that the AVG was not       salvageable secondary to poor venous outflow with a central SVC       stenosis.  Unfortunately over the next night the patient became       upset and agitated with her n.p.o. status.  There were even       episodes of frustration, crying, and even cursing with the nursing       staff.  However, this did pass after the procedure on January 22       where she was able to receive a placement of a right subclavian       and the right groin catheter was removed.  She was able to have       dinner and she was in better spirits at day of discharge.  Of note       the patient's discharge potassium 4.6.  2. Seizure disorder:  No interventions.  The patient was continued on      her usual outpatient medicines.  3. Secondary hyperparathyroidism:  Patient continued on usual      medications this admission to include her binder.  No further      interventions.   DISCHARGE MEDICATIONS:  1. Renagel 3 p.o. t.i.d. a.c.  2. Klonopin 1 mg p.o. q.h.s. p.r.n.  3. Zoloft 50 mg p.o. daily.  4. Advair inhaler 2 times daily.  5. Topical Nizoral cream twice daily.  6. Nephro-Vite 1 p.o. daily.  7. Mysoline 250 mg b.i.d.  8. Allegra 60 mg p.o. b.i.d.  9. Nexium 40 mg p.o. b.i.d.  10.Topamax 200 mg p.o. b.i.d.  11.Mysoline 50 mg p.o. b.i.d. (this will total 300 mg 2 times daily).  12.Dialyvite 1 p.o. daily.  13.Aspirin 325 mg daily.   DISCHARGE INSTRUCTIONS:  The patient discharged home on a renal diet, 80  g of protein, 2 g of salt, 2 g of potassium, with a 1200 mL fluid  restriction.  She will increase her activity as tolerated.  We will need  to explore any other options with her regarding her access and will  discuss with primary MD as an outpatient.  For now the patient is to  avoid showers with catheter in place.      [name] Fallen, PA    ______________________________  Llana Aliment. Deterding, M.D.   MY/MEDQ  D:  08/08/2007  T:  08/08/2007  Job:  [id number]    cc:   [facility]

## 2010-11-28 NOTE — Discharge Summary (Signed)
NAMESHANTA, HARTNER NO.:  [id number]   MEDICAL RECORD NO.:  [id number]          PATIENT TYPE:  INP   LOCATION:  6738                         FACILITY:  [facility]   PHYSICIAN:  Aram Beecham B. Eliott Nine, M.D.DATE OF BIRTH:  1956/03/18   DATE OF ADMISSION:  02/15/2008  DATE OF DISCHARGE:  02/18/2008                               DISCHARGE SUMMARY   PRIMARY CARE Javonte Elenes:  Duke Salvia. Eliott Nine, MD   CONSULTANTS:  Psychiatry, Antonietta Breach, M.D.   PROCEDURE:  Dialysis.   REASON FOR ADMISSION:  The patient is a 55 year old white female with  end-stage renal disease who has a history of missing her scheduled  dialysis.  She has scheduled dialysis at [facility] on  Tuesday, Thursday, and Saturday.  She has had numerous trips to the  emergency department due to the consequences of missed dialysis  treatment.  On day of admission, February 15, 2008, she presented to the  emergency department having missed hemodialysis for 1 week and was  admitted for life-threatening hyperkalemia with a potassium of 7.9 and  peaked T-waves, absent p waves, and a wide QRS complex.   PRIOR DISCHARGE DIAGNOSES:  1. Life-threatening hyperkalemia, now resolved.  2. End-stage renal disease on hemodialysis, Tuesday, Thursday, and      Saturday at [facility], [place].  3. Depression.  4. Seizure disorder.  5. History of tuberous sclerosis and mild mental retardation.  6. Gastroesophageal reflux disease.  7. Secondary hyperparathyroidism.  8. History of bilateral nephrectomies.  9. Vascular access issues with maturing left arm graft placed at [facility].  10.Seasonal allergies.  11.History of noncompliance with hemodialysis and consequent emergent      medical situations as a result of missing hemodialysis including      this visit of life-threatening hyperkalemia and another episode      during which the patient was hyperkalemic and went to bradycardic      arrest several months  ago.   DISCHARGE MEDICATIONS:  1. Cymbalta 30 mg in the morning for 3 days, then increase to 30 mg      twice a day.  2. Trazodone 25 mg nightly.  3. Topamax 200 mg b.i.d.  4. PhosLo 3 tablets t.i.d. q.a.c.  5. Procrit 28,000 three times a week with dialysis.  6. Renagel 800 mg 3 tablets with each meal.  7. Nephro-Vite 1 tablet nightly.  8. Primidone 250 mg b.i.d.  9. Aleve p.r.n.  10.Advair 2 puffs twice a day.  11.Celebrex p.r.n. for joint pain.  12.Aspirin 325 mg daily.  13.Nexium 40 mg b.i.d.   PERTINENT LABORATORY DATA:  On admission, the patient's potassium was  7.9 according to i-STAT labs done in the emergency department,  creatinine 15.5.  Discharge labs are as follows:  Hemoglobin 9.4, white  blood cell count 3.3, and platelets 120.  Sodium 130, potassium 4.7,  chloride 96, bicarb 20, BUN 75, creatinine 10.12, glucose 79, phosphorus  8.3, albumin 2.7, magnesium 2.8, and calcium 8.5.   HOSPITAL COURSE BY PROBLEM:  1. Hyperkalemia.  As mentioned previously on electrocardiogram, there      were peaked  T-waves, absent P-wave, and wide QRS complex.  She      received glucose, insulin, and one ampule of sodium bicarbonate in      the emergency department.  Subsequently, she received an amp of      calcium.  She was brought up to the Dialysis Unit for emergent      dialysis.  Initially, there were problems with her PermCath, but      they were able to get that functioning and successfully dialyzed      her.  The patient does not give a very plausible explanation for      why she missed dialysis.  She reports that she had some sort of      viral syndrome this week and gone to the [facility] at some      point.  She also states that dialysis makes her nervous, but she      has a long history of missing dialysis.   In this regard, we got a Psychiatry consult to assess that the patient  had capacity and to see that her mental health issues interfering with  her treatment.   Psychiatry consult diagnosed mood disorder, not  otherwise specified; and major depressive disorder, recurrent and  severe.  She was not suicidal, but they thought that perhaps her  uncontrolled depression was contributing to her not going to dialysis,  although they do not directly comment on capacity.  On mental status  exam, she states that she has a logical thought process that is coherent  and goal directed, and they judged that she was not at risk for harming  herself.  We did make some medication changes including taking her off  Paxil and starting her on Cymbalta, which is to be titrated up, and  starting her on trazodone to help with insomnia.  At the time of  discharge, her hyperkalemia was resolved with a normal potassium.  The  problem is when she is discharged, will she go back to dialysis.  She  has at times expressed interest in an assisted living facility, which  would probably help with her compliance; however, once again upon time  to discharge, she declines going to an assisted living facility.  The  ultimate plan is for her to first to go to her home and have a home  health psychiatric nurse, which could monitor her and help her and  encourage her compliance with dialysis.  Ultimately, she would like to  move in with her godmother, but this would require her changing dialysis  center, so she will have to arrange that on an outpatient basis.  1. End-stage renal disease on hemodialysis on Tuesday, Thursday, and      Saturday.  Again as mentioned previously, the main problem is      noncompliance.  Hopefully, having the home health nurse will help      with compliance.  2. Anemia.  The patient did have a presignificant hemoglobin down to      7.6 and where she had been 9.6 the previous day.  She was      transfused 2 units and her hemoglobin went back up to 9.4.  Her      percent saturation is within normal limits at 49%.  Her TIBC is a      little bit low at 141.  Her  ferritin is little bit at high at 308.      Most of her issue is  probably that she does not go to her      treatment, so she does not get her erythropoietin.  3. Secondary hyperparathyroidism.  The patient would continue on her      Renagel and PhosLo.  4. Psych.  The patient has major depression, severe, and some      insomnia.  I discussed in detail under problem #1.  She has already      been started and given her a prescription for Cymbalta and told to      increase her dose to 30 mg b.i.d.  She has also been given a      prescription for trazodone to take at night.  The patient will have      his psych nurse to follow along with her that has been arranged by      case management.   Condition at the time of discharge stable.  Pending test results at the  time of discharge none.   DISPOSITION:  The patient is discharged home with home health and psych  nurse.  These services are to start on March 01, 2008, as that is the  first time that a psychiatric nurse is available.   DISCHARGE FOLLOWUP:  The patient is to follow up at a regular Dialysis  Clinic, she is to go tomorrow.  Followup issues include continued psych  followup with a home health psychiatric nurse and looking into the  patient moving in with godmother and if needed switching dialysis  centers, but this will be up to the patient and the dialysis centers to  coordinate if she does not move in with her godmother and need to switch  centers.      Asher Muir, MD  Electronically Signed      Duke Salvia. Eliott Nine, M.D.  Electronically Signed    SO/MEDQ  D:  02/18/2008  T:  02/19/2008  Job:  [id number]   cc:   [facility]

## 2010-11-28 NOTE — Discharge Summary (Signed)
[name], Stone NO.:  [id number]   MEDICAL RECORD NO.:  [id number]          Stone TYPE:  INP   LOCATION:  6743                         FACILITY:  [facility]   PHYSICIAN:  Rosalyn Gess. Norins, MD  DATE OF BIRTH:  1956-01-23   DATE OF ADMISSION:  01/02/2008  DATE OF DISCHARGE:  01/06/2008                               DISCHARGE SUMMARY   DIAGNOSES AT [name] TIME OF DISCHARGE:  1. End-stage renal disease with bilateral lower extremity edema in [name]      setting of missed dialysis.  2. History of seizure disorder.  3. Depression.  4. Anemia of chronic disease.  5. Seasonal allergies.  6. Gastroesophageal reflux disease.  7. Asthma.  8. History of bradycardia.  9. Secondary hyperparathyroidism.   HISTORY OF PRESENT ILLNESS:  [name] is a 55 year old white female  admitted on January 02, 2008, with chief complaint of worsening bilateral  lower extremity edema.  [name] states that [name] was at [facility] for graft  placement in left upper extremity on Tuesday, 2 days prior to this  admission, and [name] underwent hemodialysis at that time.  [name] notes that  there was no fluid drawn during [name] hemodialysis session.  [name] was  unable to get into [name] [facility] Niece to go to [name] hemodialysis due to worsening of  bilateral lower extremity edema and [name] was brought to [name] [facility]  Emergency Department.  [name] was admitted for further evaluation and  treatment.   PAST MEDICAL HISTORY:  1. End-stage renal disease, on hemodialysis on Tuesday, Thursday, and      Saturday.  Previously at [facility].  2. GERD.  3. History of seizure disorder.  4. Asthma.  5. History of bradycardia.  [name] heart rate as low as in [name] 20s in [name]      past requiring resuscitation.  6. History of tuberous sclerosis.  7. 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  8. Anemia of chronic disease.  9. Secondary hypoparathyroidism.  10.History of poor vascular access.  11.History of MRSA bacteremia secondary to  catheter infection.  12.Allergies.  13.Depression.   COURSE OF HOSPITALIZATION:  End-stage renal disease and bilateral lower  extremity edema.  [name] was admitted.  [name] was seen in  consultation by [name] renal team and was placed on dialysis.  [name] volume  was managed per renal team.  [name] lower extremity edema has improved.  At  this time, we are in [name] process of trying to find an assisted living  for this Stone as [name] has been struggling at home and had multiple  admissions for [name] same.   PHYSICAL EXAMINATION:  VITAL SIGNS:  BP 146/81, heart rate 61,  respiratory rate 16, temperature 98.1, and O2 sat is 96% on room air.  GENERAL:  [name] is a morbidly obese white female who is awake and  alert and in no acute distress.  CARDIOVASCULAR:  S1 and S2, regular rate and rhythm is noted.  EXTREMITIES:  [name] is noted to have improvement in [name] lower extremity  edema, currently 2+ bilaterally.  RESPIRATORY:  Lungs are clear to auscultation bilaterally  without  wheezes, rales, or rhonchi.  There is no increased work of breathing.  ABDOMEN:  Soft, nontender, and nondistended.  Positive bowel sounds were  noted.  SKIN:  [name] is noted to have multiple small nodular lesions on  [name] skin, diffusely, likely secondary to tuberous sclerosis and adenoma  sebaceum.  NEURO:  [name] is awake and alert.  Speech is clear.  Answers  questions appropriately.   PERTINENT LABORATORY DATA:  At time of discharge, hemoglobin 10,  hematocrit 30, BUN 66, creatinine 8.29,  and potassium 5.6 prior to  hemodialysis.   DISPOSITION:  [name] will be discharged to assisted living  facility, pending bed availability.   FOLLOWUP:  [name] is scheduled to follow up with Dr. Darryll Capers  on Monday, February 02, 2008, at 2:30 p.m.   MEDICATIONS AT [name] TIME OF DISCHARGE:  1. Topamax 200 mg p.o. b.i.d.  2. PhosLo 667 mg tabs, 2 tablets p.o. t.i.d.  3. Protonix 40 mg p.o. b.i.d.  4.  Primidone 250 mg p.o. t.i.d.  5. Colace 100 mg p.o. b.i.d.  6. MiraLax 17 g in 8 ounces of water p.o. daily.  7. Renagel 800 mg tablet, 3 tablets p.o. t.i.d.  8. Nephro-Vite 1 tablet p.o. daily.  9. Aspirin 325 mg p.o. daily.  10.Nu-Iron 150 mg p.o. b.i.d.  11.Klonopin 1 mg p.o. on Tuesdays, Thursdays, and Saturdays with      hemodialysis.  12.Aranesp 200 mcg IV every Friday with hemodialysis.  13.Paxil 30 mg every other day.  14.Advair 100/50 1 puff twice daily.  15.Celebrex 200 mg p.o. b.i.d.  16.Temazepam 30 mg p.o. daily at bedtime as needed.   DIET AT [name] TIME OF DISCHARGE:  Renal diet.   ACTIVITY:  [name] is instructed to increase [name] activity slowly.   Greater than 30 minutes was spent on discharge planning.      Sandford Craze, NP      Rosalyn Gess. Norins, MD  Electronically Signed    MO/MEDQ  D:  01/06/2008  T:  01/07/2008  Job:  [id number]   cc:   Stacie Glaze, MD

## 2010-11-28 NOTE — Op Note (Signed)
NAMEKIMBLY, [name]             ACCOUNT NO.:  [id number]   MEDICAL RECORD NO.:  [id number]          PATIENT TYPE:  AMB   LOCATION:  SDS                          FACILITY:  [facility]   PHYSICIAN:  Charles E. Fields, MD  DATE OF BIRTH:  10-Dec-1955   DATE OF PROCEDURE:  06/23/2007  DATE OF DISCHARGE:                               OPERATIVE REPORT   PROCEDURE:  Right upper arm arteriovenous graft.   PREOPERATIVE DIAGNOSIS:  End-stage renal disease.   POSTOPERATIVE DIAGNOSIS:  End-stage renal disease.   ANESTHESIA:  Local with IV sedation.   ASSISTANT:  Gae Bon, PA-C   OPERATIVE FINDINGS:  1. 6-mm PTFE end-to-end to axillary vein.  2. Vein 3 mm in diameter.  3. Artery 2.5 mm in diameter.   OPERATIVE DETAIL:  After obtaining informed consent, the patient taken  to the operating room.  The patient placed in supine position on the  operating table.  After adequate sedation, the patient's entire right  upper extremity was prepped and draped in the usual sterile fashion.  Local anesthesia was infiltrated near the antecubital crease.  A  transverse incision made in this location and carried down through the  subcutaneous tissues.  There was no obvious visible cephalic vein.  There was what appeared to be a large collateral in the subcutaneous  tissues but this did not seem to be of reasonable quality for creation  of a fistula.  Next the brachial artery was dissected free in the medial  portion of the incision.  The adjacent deep brachial veins were also  small approximately 2 mm in diameter.  The brachial artery was dissected  free circumferentially and was approximately 2.5 mm in diameter.  Vessel  loops were placed proximal and distal to site of planned arteriotomy.  Since the veins in the antecubital region were all quite small, I  decided that the best option would upper arm graft.  Next, local  anesthesia was infiltrated in the axilla.  A longitudinal incision was  made in  this location carried down through subcutaneous tissues down to  level of the axillary vein.  This again was fairly small in diameter  approximately 3 mm.  This dissected free circumferentially.  Next a  subcutaneous tunnel was created connecting the upper arm incision to the  lower arm incision in an arcing configuration out over the biceps  muscle.  The patient was then given 5000 units of intravenous heparin.  Vessel loops were used to control brachial artery proximally and  distally.  Longitudinal arteriotomy was made in the brachial artery.  The graft was slightly beveled and sewn end graft to side of artery  using a running 6-0 Prolene suture.  Just prior to completion of  anastomosis, this was fore bled, back bled and thoroughly flushed.  Anastomosis was secured, graft was clamped just above the level of the  anastomosis.  Attention was then turned to the upper arm.  The distal  axillary vein was ligated with a 2-0 silk tie.  The vein was then  transected and controlled proximally with a small bulldog clamp.  The  vein was opened longitudinally and the graft beveled and sewn end of  graft to end of vein using running 6-0 Prolene suture.  Just prior to  completion of anastomosis, this was fore bled, back bled and thoroughly  flushed.  Anastomosis was secured, clamp was released.  There was good  flow in the upper portion of graft immediately.  Doppler was used to  inspect the right radial artery and there was good audible Doppler flow  with augmentation approximately 50% with clamping of the graft.  Next  hemostasis was obtained.  Subcutaneous tissues  of both incisions reapproximated using running 3-0 Vicryl suture.  Skin  of both incisions closed with a 4-0 Vicryl subcuticular stitch.  The  patient tolerate procedure well and there were no complications.  Instrument sponge and needle counts was correct at end of the case.  The  patient taken to recovery room in stable  condition.      Janetta Hora. Fields, MD  Electronically Signed     CEF/MEDQ  D:  06/23/2007  T:  06/23/2007  Job:  [id number]

## 2010-11-28 NOTE — Consult Note (Signed)
NAMESEVEN, MARENGO NO.:  [id number]   MEDICAL RECORD NO.:  [id number]          PATIENT TYPE:  INP   LOCATION:  6738                         FACILITY:  [facility]   PHYSICIAN:  Antonietta Breach, M.D.  DATE OF BIRTH:  1955/12/18   DATE OF CONSULTATION:  02/18/2008  DATE OF DISCHARGE:                                 CONSULTATION   Ms. Bucklew generally had trouble sleeping last night and was fatigued  most of the morning.  She is not having any thoughts of harming herself  or others.  She has no hallucinations or delusions.  Her mood is  improved.  She has normal interests and hope.   REVIEW OF SYSTEMS:  GASTROINTESTINAL:  She has no nausea.  She states  that she has chronic loose stools anyway.   LABORATORY DATA:  WBC 3.3, hemoglobin 9.4, platelet count is 120.   EXAMINATION:  VITAL SIGNS:  Temperature 97.4, pulse 79, respiratory rate  20, blood pressure 112/68, O2 saturation on room air is 94%.   MENTAL STATUS EXAM:  Ms. Nickle is alert.  She is oriented to all  spheres.  Her eye contact is good.  Her attention span is normal.   Concentration slightly decreased.  Affect is mildly constricted.  Mood  is mildly depressed.  Memory and orientation function are intact.  Speech is soft with normal rate and prosody.  There is no dysarthria.  Thought process is logical, coherent, goal-directed.  No looseness of  associations.  Thought content:  No thoughts of harming herself, no  thoughts of harming others, no delusions, no hallucinations.  Insight is  intact.  Judgment intact.   ASSESSMENT:  293.83 mood disorder not otherwise specified (idiopathic  and general medical factors), depressed, improved.  296.35 major depressive disorder.   Ms. Guinther is not at risk to harm herself or others.  She does agree to  call emergency services immediately for any thoughts of harming herself  or other psychiatric emergency symptoms.   RECOMMENDATIONS:  Would continue with  discontinuing the Paxil and the  Cymbalta titration as discussed.   Would start trazodone now at 25 mg every night, adjusting by 25 mg per  day until the patient's insomnia is resolved, not to exceed 200 mg every  night.   The patient is not to drive if drowsy.   Would have the patient follow up with one of the psychiatric clinics  attached to [facility], [place] or [facility].      Antonietta Breach, M.D.  Electronically Signed     JW/MEDQ  D:  02/18/2008  T:  02/18/2008  Job:  [id number]

## 2010-11-28 NOTE — Discharge Summary (Signed)
[name], ALONGE NO.:  [id number]   MEDICAL RECORD NO.:  [id number]          PATIENT TYPE:  INP   LOCATION:  5024                         FACILITY:  [facility]   PHYSICIAN:  Aram Beecham B. Eliott Stone, M.D.DATE OF BIRTH:  Apr 24, 1956   DATE OF ADMISSION:  09/20/2007  DATE OF DISCHARGE:  10/02/2007                               DISCHARGE SUMMARY   HISTORY:  This is a 55 year old with a past medical history of end-stage  renal disease, Tubero sclerosis, who goes to dialysis at [facility].  She comes in complaining of chest discomfort and pulling out her  catheter.  This is worse at night.  She missed dialysis because of no  transportation on the day before admission.  She relates no shortness of  breath, no palpitations, no nausea, no vomiting, no diarrhea.   PHYSICAL EXAMINATION:  VITAL SIGNS:  Blood pressure 115/85, pulse 85,  temperature 97.9 degrees, breathing 96% on room air.  GENERAL:  She appeared in no acute distress.  NECK:  Supple, no thyromegaly, no bruits.  CARDIOVASCULAR:  A regular rate and rhythm.  Normal S1 and S2.  LUNGS:  Clear to auscultation with good air movement.  ABDOMEN:  Positive bowel sounds, nontender, nondistended.  LYMPH:  No lymphadenopathy palpable.  EXTREMITIES:  Positive pulses with no ulcers, no edema.  NEUROLOGIC:  Normal affect.  Alert and oriented x3, nonfocal.   LABORATORY DATA:  On the day of admission were a hemoglobin of 10.2.  Sodium 133, potassium 6.1, chloride 106, bicarbonate 77, BUN 77,  creatinine 6.9.  Cardiac enzymes negative x 3.   DISCHARGE DIAGNOSES:  1. End-stage renal disease.  2. Hypertension.  3. History of seizures.  4. Mental retardation.  5. Tubero sclerosis   DISCHARGE MEDICATIONS:  1. Topamax 200 mg p.o. twice daily.  2. Calcium acetate 1334 mg p.o. three times daily.  3. Klonopin 1 mg p.o. at bedtime.  4. Protonix 40 mg p.o. daily.  5. Adair one puff twice daily.  6. Paxil 30 mg at bedtime.  7.  Renagel 800 mg three times daily.  8. Aranesp 200 mg IV on Tuesdays with dialysis.  9. Primidone 250 mg three times daily.  10.InFeD 50 mg IV on Thursdays.  11.Oxycodone one tab p.o. q.4h.  12.Docusate twice daily p.o.  13.MiraLax 17 grams p.o. daily.   1.   PROCEDURE:  The patient had a 2-D echocardiogram that showed left  ventricular systolic function normal, ejection fraction estimated to be  55%.  This study was inadequate because of body habitus but no left  ventricular wall motion abnormality.  1. An electroencephalogram showed abnormal electroencephalogram      recording, due to dysrhythmic theta activity, emanating from the      left temporal lobe.  Such a recording suggests left brain      dysfunction with irritability.  This study suggests a low seizure      threshold, although no epileptic seizure events were recorded.  2. Myoview that showed a fixed defect involving the inferior wall and      inferior septum, suggestive of an old infarction, versus  non-      uniform soft tissue attenuation.  There was also mild reversibility      within the area between the rest and stress test.  The differences      are considered mild and may reflect non-uniform soft tissue      attentuation.  Her ejection fraction was 53% and lateral wall      hypokinesia.  3. She also had a V/Q scan that was low likelihood for pulmonary      embolism.   CONSULTATIONS:  1. Cardiology, Dr. Loraine Leriche C. Skains.  2. Neurology, Dr. Bevelyn Buckles. [place].   HOSPITAL COURSE:  #1 - Chest Pain:  The patient was admitted and was  ruled out by cardiac enzymes and electrocardiogram, but due to her risk  factors, cardiology was consulted.  Cardiology recommended, because of  her risk factors, to do a Myoview.  The Myoview results are as above.  They recommended to treat her medically, because her Myoview was low  probability for any signs of ischemia.  She will follow up.   #2 - End-Stage Renal Disease:  The patient  was admitted and was  dialyzed.  It did not seem that she was volume-overloaded and she was  dialyzed on her regular days, Tuesdays, Thursdays and Saturdays.  She  will continue to follow up at [facility].  Her  dry weight is 144.9 pounds.   #3 - Hypertension:  Her blood pressure was well-controlled.  Her blood  pressure medicine will not be started.  It will be followed up as an  outpatient.   #4 - Seizure Disorder:  The patient was complaining of seizures, so  Primidone levels were obtained, which were low.  They were repeated in  two days, which showed some improvement, which we think the patient was  not taking her medication.  We did an electroencephalogram that did not  show any foci of epilepsy.  Neurology was consulted and they recommended  to continue medical treatment.  As an outpatient, I would encourage the  patient to continue to take her medication.   DISPOSITION  She will follow up with Dr. Hyman Hopes in 2 weeks. Here a CBC will be done  anemia panel and PTH. Pt will need a repeat CT scan in 3-6 month to  follow up 2 cm mass  on the right renal fossa, she had on a CT scna done  in the ED 2 weeks prior to admission.   DISCHARGE PHYSICAL EXAMINATION:  VITAL SIGNS:  On the day of discharge  temperature 98.2 degrees, blood pressure 120/77, pulse 68, respirations  22.  She was saturating 98% on room air.   DISCHARGE LABORATORY DATA:  White count 6.3, hemoglobin 9.1, platelets  175.  Sodium 130, potassium 6.7, chloride 95, bicarb 22, BUN 69,  creatinine 8.2, glucose 73.  Albumin 2.9, calcium 8.8, phosphorus 1.9.      [name], M.D.  Electronically Signed     ______________________________  [name], M.D.    AF/MEDQ  D:  09/30/2007  T:  09/30/2007  Job:  [id number]   cc:   Garnetta Buddy, M.D.

## 2010-11-28 NOTE — Op Note (Signed)
[name], [name] NO.:  [id number]   MEDICAL RECORD NO.:  [id number]          PATIENT TYPE:  INP   LOCATION:  2301                         FACILITY:  [facility]   PHYSICIAN:  Janetta Hora. Fields, MD  DATE OF BIRTH:  1956-06-14   DATE OF PROCEDURE:  05/18/2008  DATE OF DISCHARGE:                               OPERATIVE REPORT   PROCEDURES:  1. Ultrasound of neck and groin.  2. Placement of right femoral Diatek catheter.   PREOPERATIVE DIAGNOSIS:  Renal failure.   POSTOPERATIVE DIAGNOSIS:  Renal failure.   ANESTHESIA:  General.   ASSISTANT:  Nurse.   OPERATIVE FINDINGS:  1. Nonvisualized right and left internal jugular veins, presumably      occluded.  2. A 50-cm right femoral Diatek catheter.  3. A 15 mL of IV contrast dye.   OPERATIVE DETAILS:  After obtaining informed consent, the patient was  taken to the operative room.  The patient was placed in the supine  position on the operating room table.  After induction of general  anesthesia, the patient's neck was inspected with an ultrasound.  The  external jugular veins were found to be compressible.  However, the  internal jugular vein could not be identified on either side.  The  common carotid artery was identified bilaterally with no adjacent vein  structures.   Attention was then turned to the right groin.  Ultrasound was performed  of the right groin and this showed a patent right femoral vein.  The  patient's entire right groin was prepped and draped in the usual sterile  fashion.  Using ultrasound guidance, the right common femoral vein was  cannulated successfully.  A 0.035 J-tip guidewire was threaded through  the right common femoral vein up to the level of the inferior vena cava  under fluoroscopic guidance.  Next, sequential 12, 14, and 16-French  dilators were placed over the guidewire into the right femoral vein.  A  50-cm Diatek catheter was then placed through the peel-away sheath into  the right femoral vein and up to the level of the cavoatrial junction.  There was a kink in one of the limbs of the catheter.  Therefore, the  end of the catheter was trimmed to length and the 0.035 J-tip guide wire  threaded through the venous ports and I was able to use this to unkink  the tip.  Both tips were then appeared to be in the right atrium.  This  was confirmed with contrast injection of both ports.  The patient had  previously listed CONTRAST allergy of a skin reaction from CONTRAST.  Therefore, she was given 50 mg of Benadryl and 20 mg of famotidine  premedication.  The patient was already on steroids.   Next, the catheter was tunneled and the hub attached.  Catheter was  noted to flush and draw easily.  Catheter was loaded with concentrated  heparin solution.  Catheter was sutured to skin with nylon sutures.  The  insertion site was  closed with Vicryl stitch.  The patient tolerated the procedure well and  there were no complications.  Instrument, sponge, and needle counts were  correct at the end of the case.  The patient was taken to the room to  the Intensive Care Unit in stable condition.      Janetta Hora. Fields, MD  Electronically Signed     CEF/MEDQ  D:  05/18/2008  T:  05/18/2008  Job:  [id number]

## 2010-11-28 NOTE — Discharge Summary (Signed)
NAMETRYPHENA, PERKOVICH             ACCOUNT NO.:  [id number]   MEDICAL RECORD NO.:  [id number]          PATIENT TYPE:  INP   LOCATION:  6732                         FACILITY:  [facility]   PHYSICIAN:  Juleen China IV, MDDATE OF BIRTH:  Feb 07, 1956   DATE OF ADMISSION:  05/15/2008  DATE OF DISCHARGE:  05/24/2008                               DISCHARGE SUMMARY   PATIENT OF:  1. Durene Cal IV, MD   FINAL DIAGNOSES:  1. 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  2. End-stage renal disease.  3. Hypertension.  4. 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   PROCEDURES PERFORMED:  Patch angioplasty of left brachial artery with  bovine pericardial patch and resection of brachial artery with  pseudoaneurysm on May 15, 2008.   COMPLICATIONS:  None.   CONDITION ON DISCHARGE:  Stable and improving.   DISCHARGE MEDICATIONS:  1. Aspirin 325 mg p.o. daily.  2. Advair 250/50, inhalation b.i.d.  3. Claritin 10 mg p.o. daily.  4. Amoxicillin 250 mg p.o. b.i.d.  5. Renal vitamin 1 p.o. nightly  6. Renagel 240 mg p.o. t.i.d. with meals.  7. Topamax 200 mg p.o. b.i.d.  8. Nexium 40 mg p.o. daily.  9. PhosLo 2001 mg p.o. t.i.d. with meals.  10.Vancomycin 1250 mg every Tuesday, Thursdays, and Saturdays with      hemodialysis for the next few weeks.  11.Percocet 5/325 one p.o. q.4 h. p.r.n. pain.   DISPOSITION:  She is being discharged home in stable condition with her  wound healing well.  She is given careful instructions regarding the  care of her wound.  She is instructed to up in her activities.  She is  to be followed up by Dr. Myra Gianotti in 1 week with suture removal.  She is  to return to repeat a hemodialysis.   BRIEF IDENTIFYING STATEMENT:  For complete details, please refer the  typed history and physical.  Briefly, this very pleasant 55 year old  woman was evaluated by Dr. Myra Gianotti, with an infected left upper arm AV  Gore-Tex graft.  She was seen in the emergency room,  who recommended  removal of the graft.  She was informed of the risks and benefits of the  procedure and after careful consideration, elected to proceed with  surgery.   HOSPITAL COURSE:  Left workup was completed on an urgent basis.  She was  taken to the operating room and underwent the aforementioned procedure.  For complete details, please refer the typed operative report.  The  procedure was without complications.  She was returned to the intensive  care unit in critical condition.  I need to appreciate the critical care  medicine assistance while she was in the intensive care unit.  A Diatek  was placed on May 18, 2008, for dialysis.  She did have Enterococcus  faecalis bacteremia and was treated with Unasyn and vancomycin.  We are  able to the extubate her on June 14, 2008.  She was subsequently  able to be transferred to a bed in a renal floor on May 19, 2008.  We do appreciate the Nephrology  Service on their assistance with medical  treatment and hemodialysis.   Following transfer to a bed on renal floor, she continued to display  steady improvement.  Her arm continued to improve.  She was walking with  improvement of physical therapy.  Her wound displayed continuing  healing.  On May 24, 2008, she was unstable and wished to go home  and she was subsequently discharged in stable condition.      Wilmon Arms, PA      V. Charlena Cross, MD  Electronically Signed    KEL/MEDQ  D:  05/24/2008  T:  05/25/2008  Job:  [id number]   cc:   Jorge Ny, MD
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
## 2010-11-28 NOTE — Discharge Summary (Signed)
[name], MICHALSKY NO.:  [id number]   MEDICAL RECORD NO.:  [id number]          PATIENT TYPE:  INP   LOCATION:  5523                         FACILITY:  [facility]   PHYSICIAN:  Maree Krabbe, M.D.DATE OF BIRTH:  1956/07/08   DATE OF ADMISSION:  12/27/2006  DATE OF DISCHARGE:  01/01/2007                               DISCHARGE SUMMARY   DISCHARGE DIAGNOSES:  1. Status post ligation of left upper extremity arteriovenous fistula      with placement of new left forearm arteriovenous graft and right      internal jugular Diatek catheter.  2. Pain control with poor social situation.  3. Anemia.  4. Secondary hyperparathyroidism.  5. Hypertension.  6. Asthma.  7. Seizure disorder  8. Tubular sclerosis.  9. End-stage renal disease.  10.Depression.   PROCEDURES:  1. December 27, 2006, ligation of left upper extremity AV fistula with      insertion of a left forearm AV graft and right IJ Diatek catheter      by Dr. Hart Rochester.  2. December 27, 2006, thrombectomy of left forearm AV graft with insertion      of new segment of graft from existing graft to brachial vein, again      by Dr. Hart Rochester   CONSULTATIONS:  [facility]   HISTORY OF PRESENT ILLNESS:  This is a 55 year old Caucasian female with  tubular sclerosis with prior bilateral nephrectomies on dialysis at the  [facility].  She had a left upper extremity AV  fistula that developed stenosis and occlusion of the central cephalic  vein with pseudoaneurysm.  She was admitted for a new left upper  extremity access placement as well as right IJ Diatek catheter  placement.  The graft thrombosed in the PACU and she had to undergo  thrombectomy as well.  She lives alone and states she does not have any  help at home and cannot go home at this time, as she will not be able to  take care of herself secondary to pain in her left upper extremity and  right neck/chest.   ADMITTING LABORATORY  DATA:  WBC 4.0, hemoglobin 11.1, hematocrit 34.1,  platelet 135.  Sodium 135, potassium 5.7, chloride 99, CO2 24, glucose  75, BUN 48 and creatinine 8.  Albumin 2.9 calcium 8.5 and phosphorus  8.6.   DIAGNOSTIC RADIOLOGICAL EXAMINATIONS:  December 27, 2006 one-view chest x-  ray, impression:  Right Diatek catheter tip in low SVC.  No  pneumothorax.   HOSPITAL COURSE:  #1 - STATUS POST LIGATION OF LEFT UPPER EXTREMITY AV  FISTULA WITH NEW LEFT FOREARM AV GRAFT PLACEMENT AS WELL AS RIGHT IJ  DIATEK CATHETER.  Again, as stated above, the patient's new left forearm  AV graft thrombosed in the recovery room.  She returned to the operating  room for any successful declot at that time.  Her hemodialysis access  has remained stable throughout the remainder of her hospital stay.   #2 - PAIN MANAGEMENT WITH POOR SOCIAL SITUATION.  The patient was given  pain medications as needed for pain.  Her discharge needs were discussed  with the case manager and Child psychotherapist.  As stated above the patient  does live alone and felt that secondary to the pain from surgery, she  would not be able to care for herself.  Initially, both the patient and  her payee both agreed for assisted-living placement and bed options were  pursued.  However, ultimately the patient refused and was discharged  home.   #3 - ANEMIA.  Upon admission the patient was continued on her outpatient  regimen of Epogen and iron therapy.  Her hemoglobin remained stable  throughout her hospital stay and by discharge her hemoglobin was 10.8  and hematocrit 32.6.   #4 - SECONDARY HYPERPARATHYROIDISM.  Upon admission, the patient was  continued on her phosphate binder and vitamin D therapy.  Her phosphorus  was noted to be elevated during her hospital stay.  It was discovered  upon further investigation the patient was not taking her phosphate  binder therapy as prescribed.  Ultimately, as a result, the patient's  phosphate binder was  increased and the patient was counseled.  This  issue will continue to be followed at the outpatient hemodialysis  center.   #5 - HYPERTENSION.  The patient required no medications during the  hospitalization and her blood pressure was well-controlled with  ultrafiltration during hemodialysis.  By discharge, the patient's  systolic blood pressure ranged from 103-130 and heart rate in the 70s  and 80s.   #6 - ASTHMA.  The patient's asthma remained stable throughout her entire  hospital stay and she continued her Advair therapy without difficulty.   #7 - SEIZURE DISORDER.  The patient continued her outpatient regimen of  Keppra, Klonopin and Primidone therapy with no seizure activity noted  during her stay.   #8 - TUBULAR SCLEROSIS.  Remained stable throughout her hospitalization.   #9 - END-STAGE RENAL DISEASE.  The patient continued hemodialysis  treatment via a new right IJ Diatek catheter.  Average blood flow rate  was 400 with average ultrafiltration of 3 L.  The patient's blood  pressure remained stable throughout her treatment with systolic ranging  from the 90s to low 100s.   #10 - DEPRESSION.  The patient continued on her outpatient regimen of  Zoloft therapy throughout her entire hospital stay.  She tolerated well  without difficulty.   DISCHARGE MEDICATIONS:  1. Renagel 800 mg four tablets p.o. t.i.d. with meals and two tablets      p.o. with snacks.  2. PhosLo 667 mg two tablets p.o. t.i.d. with meals.  3. Klonopin 1 mg one p.o. q.h.s.  4. Primidone 300 mg one p.o. b.i.d.  5. Topamax 200 mg one p.o. b.i.d.  6. Nexium 4 mg p.o. b.i.d.  7. Zoloft 50 mg one p.o. daily.  8. Allegra 60 mg one p.o. daily as needed for sinus congestion or      allergies.  9. Nephro-Vite one tablet p.o. daily.  10.Advair 250/50 one puff b.i.d.  11.Tylox one tablet p.o. q.4-6h. p.r.n. pain.   HEMODIALYSIS MEDICATIONS:  1. Epogen 26,000 units IV each hemodialysis treatment.  2. InFeD 50  mg IV each week with hemodialysis.   DISCHARGE INSTRUCTIONS:  1. The patient is to resume a renal diet with a 1200-mL fluid      restriction.  2. No showers or swimming at this time.  She must keep her catheter      site dry.  3. She will return to her outpatient hemodialysis as scheduled.  4. The patient has been instructed to discontinue her iron, Niferex,      calcium and multivitamin.   HEMODIALYSIS INSTRUCTIONS:  No further hemodialysis changes at this time  present.  Please resume her preadmission hemodialysis orders.  Please  follow the progression of her left forearm AV graft and schedule Diatek  catheter removal after successful use of her left forearm AV graft.   Please note this patient and her discharge took approximately 45 minutes  to prepare.      Tracey P. Sherrod, NP      Maree Krabbe, M.D.  Electronically Signed    TPS/MEDQ  D:  01/03/2007  T:  01/03/2007  Job:  [id number]   cc:   Quita Skye. Hart Rochester, M.D.

## 2010-11-28 NOTE — H&P (Signed)
NAME:  [name], [name] NO.:  [id number]   MEDICAL RECORD NO.:  [id number]          PATIENT TYPE:  INP   LOCATION:  6743                         FACILITY:  [facility]   PHYSICIAN:  Ramiro Harvest, MD    DATE OF BIRTH:  04-27-1956   DATE OF ADMISSION:  01/02/2008  DATE OF DISCHARGE:                              HISTORY & PHYSICAL   ATTENDING PHYSICIAN:  Ramiro Harvest, MD.   PRIMARY CARE PHYSICIAN:  Dr. Lovell Sheehan of [facility].   RENAL DOCTOR:  Dr. Darrick Penna.   HISTORY OF PRESENT ILLNESS:  [name] is a 55 year old white  female with a history of end-stage renal disease, on hemodialysis on  Tuesdays, Thursdays, and Saturdays, who presents to the ED with  increasing bilateral lower extremity edema.  The patient states that her  last dialysis was approximately 5 days ago on Saturday prior to  admission.  She states that she was at [facility] for graft placement of the  left upper extremity.  Two days prior to admission, she underwent  dialysis, however, no fluid was drawn off.  The patient states that  since then she has been having worsening bilateral lower extremity edema  to the point where she was unable to get into the [facility] taking her to  dialysis.  The patient's aid then brought her to the ED.  The patient  denies any change in her chronic shortness of breath.  No chest pain.  No nausea.  No vomiting.  No change in her chronic lower abdominal pain.  No melena.  No hematochezia.  No hematemesis.  No cough.  No focal  neurological symptoms.  The patient states that she is just not feeling  too well.  No other associated symptoms in the ED.  Point-of-care  cardiac markers obtained were negative.  I-STAT 8 with a creatinine of  8.1, otherwise the rest of those labs were unremarkable.  Chest x-ray  was normal.  We were called to admit the patient to be dialyzed to see  some improvement with her bilateral lower extremity edema.   ALLERGIES:  IV PUSH DYE and  IODINE CONTRAST.  Allergic to MEDROL and  allergic to TRIDIL.   PAST MEDICAL HISTORY:  1. End-stage renal disease, on dialysis on Tuesday, Thursday, and      Saturday's on [employer].  2. Gastroesophageal reflux disease.  3. History of seizure disorder.  4. Asthma.  5. History of bradycardia to the 20s with volume resuscitation.  6. History of tuberous sclerosis and adenoma sebaceum.  7. Angiomyolipoma status post bilateral nephrectomy.  8. Anemia of chronic disease.  9. Secondary hyperparathyroidism.  10.Poor vascular access.  11.History of MRSA bacteremia secondary to catheter infection.  12.Allergies.  13.Depression.   MEDICATIONS:  1. Topamax 200 mg b.i.d.  2. PhosLo 667 mg 2 tablets t.i.d. with meals.  3. Klonopin 1 mg p.o. with dialysis.  4. Nexium 40 mg b.i.d.  5. Advair 1 puff b.i.d.  6. Paxil 30 mg daily.  7. Primidone 250 mg t.i.d.  8. Colace 100 mg b.i.d.  9. MiraLax 17 grams in 4 ounces of  water daily.  10.Celebrex 200 mg b.i.d. p.r.n.  11.Renagel 800 mg 3 tabs t.i.d. with meals.  12.Nephro-Vite 1 tab daily.  13.Aspirin 325 mg daily.  14.Allegra 60 mg daily.  15.Multivitamin daily.  16.Aleve as needed.  17.Temazepam 30 mg nightly p.r.n.  18.Iron complex 150 mg b.i.d.   SOCIAL HISTORY:  The patient lives alone in [place], is single,  finished 10th grade, is disabled.  No tobacco.  No alcohol.  No IV drug  use.   FAMILY HISTORY:  Mother deceased at age 73 from a motor vehicle  accident.  Father alive at age 74 with type 2 diabetes and history of  CVAs.  The patient has 1 brother that was deceased in a motor vehicle  accident and another brother that was deceased with meningitis and  another brother that was found hanging likely secondary to suicide.  The  patient has no children.   REVIEW OF SYSTEMS:  As per HPI, otherwise negative.   PHYSICAL EXAMINATION:  VITAL SIGNS:  Temperature 98.6, blood pressure  121/75, pulse of 86, respiratory rate 18, and  satting 100% on room air.  GENERAL:  An obese female, sitting on chair, adenoma sebaceum on the  nose, neck, and right upper chest.  No apparent distress.  HEENT: Normocephalic and atraumatic.  Pupils are equal, round, and  reactive to light.  Extraocular movements are intact.  Oropharynx is  clear.  No lesions.  No exudates.  NECK:  Supple.  No lymphadenopathy.  RESPIRATORY:  Lungs are clear to auscultation bilaterally.  No wheezes.  No rhonchi.  No crackles.  CARDIOVASCULAR:  Regular rate and rhythm.  No murmurs, rubs, or gallops.  Distant heart sounds.  Right IJ catheter looks clean.  ABDOMEN:  Soft and obese.  Reducible ventral hernia.  Positive bowel  sounds.  Slight tenderness to palpation in the lower abdomen and scar  from prior surgery.  EXTREMITIES:  Left upper extremity with a graft and a bandage.  No  clubbing.  No cyanosis.  Bilateral lower extremity with about 2-3+  edema.  NEUROLOGICAL:  The patient is alert and oriented x3.  Cranial nerves II-  XII are grossly intact.  No focal deficits.   LABORATORY DATA:  Point-of-care cardiac markers, CK-MB less than 1,  troponin I less than 0.05, and myoglobin 141.  I-STAT, sodium 137,  potassium 4.3, chloride 106, BUN 53, creatinine 8.1, and glucose 82.  Hemoglobin 8.8 and hematocrit 26.0.  Chest x-ray with no acute  cardiopulmonary disease, borderline cardiomegaly with resolved pulmonary  edema.   ASSESSMENT AND PLAN:  [name] is a 55 year old female with  end-stage renal disease, on hemodialysis, who presents with worsening  bilateral lower extremity edema and unable to attend dialysis.  1. Bilateral lower extremity edema, likely secondary to missed days of      dialysis.  The patient with no chest pain, no change in her      shortness of breath, no lower extremity pain.  We will admit the      patient.  We will check a CMET, check a CBC with diff, check a      urinalysis.  Saline lock IV fluids.  Consult with  renal for      dialysis in the morning.  2. End-stage renal disease, on hemodialysis on Tuesday, Thursday, and      Saturday.  The patient under dialysis since Saturday on [employer], had some dialysis at [place] on Tuesday,  however, no fluid was      drawn.  We will place the patient on home regimen of her renal      medications of PhosLo, Renagel, and Nephro-Vite.  Consult with      Nephrology for dialysis.  3. Seizure disorder.  Continue home dose Topamax.  4. Depression.  Paxil.  5. Anemia of chronic disease.  Follow H&H.  Continue home dose iron      complaints.  Continue Epogen.  6. Allergies.  Allegra.  7. Gastroesophageal reflux disease.  Protonix.  8. Asthma.  Advair.  9. History of bradycardia.  10.Secondary hyperparathyroidism.  11.Prophylaxis.  Protonix for gastrointestinal prophylaxis.      Sequential compression devices for deep vein thrombosis      prophylaxis.   It was a pleasure taking care of Ms. Sydnee Levans.      Ramiro Harvest, MD  Electronically Signed     DT/MEDQ  D:  01/02/2008  T:  01/02/2008  Job:  [id number]   cc:   Dr. [facility]  Dr. Darrick Penna

## 2010-11-28 NOTE — Discharge Summary (Signed)
[name], [name] NO.:  [id number]   MEDICAL RECORD NO.:  [id number]          PATIENT TYPE:  INP   LOCATION:  6707                         FACILITY:  [facility]   PHYSICIAN:  Maree Krabbe, M.D.DATE OF BIRTH:  1956/01/21   DATE OF ADMISSION:  09/11/2008  DATE OF DISCHARGE:  09/12/2008                               DISCHARGE SUMMARY   ADMITTING DIAGNOSES:  1. Shortness of breath and cough.  2. Abdominal pain.  3. End-stage renal disease, on chronic hemodialysis.  4. Bronchitis.  5. Hyperkalemia.  6. Seizure disorder.  7. Mild congestive heart failure secondary to volume overload.  8. Morbid obesity.  9. Chronic depression, anxiety.  10.Asthma.   DISCHARGE DIAGNOSES:  1. Bronchitis treated with short course antibiotics.  2. Mild congestive heart failure secondary to volume excess, resolved      with hemodialysis.  3. Hyperkalemia, corrected with hemodialysis.  4. End-stage renal disease, on chronic hemodialysis.  5. Chronic depression, anxiety.  6. Morbid obesity.  7. Seizure disorder.   BRIEF HISTORY:  A 55 year old white female with history of end-stage  renal disease on chronic hemodialysis, last dialyzed February 25 at  [facility] with a history of asthma, obesity,  seizure disorder, chronic depression, anxiety presents to the emergency  room instead of going to hemodialysis complaining of an abdominal pain  with coughing.  She is found to have a potassium of 5.7 with a chest x-  ray showing mild vascular congestion.  Her cough has been going on for 3  weeks and she has not been on any antibiotics.  She denies fever,  chills, sweats, sore throat, productive cough, chest pain, nausea,  vomiting, diarrhea, dysuria, joint pain, or swelling.   LABS ON ADMISSION:  Potassium 5.7, white count 3000, hemoglobin 11,  platelets 70,000, sodium 127, BUN 73, creatinine 9.7, glucose 80 on i-  STAT.  Serum BMET shows a potassium of 5.7,  sodium 127, CO2 of 16, BUN  70, creatinine 8.7, calcium 9.   HOSPITAL COURSE:  The patient was admitted, underwent a 3-hour dialysis  treatment during which time 4 L of ultrafiltrate was removed with the  lowest blood pressure recorded at 126/69.  No postdialysis weight was  obtained.  On the day of discharge, her potassium was 4.8.  Her lungs  were grossly clear.  She complained of not feeling great, but was  afebrile with a normal white blood cell count.  She is being discharged  home on empiric Avelox 400 mg 1 every evening for 4 days.  She has an  albuterol inhaler at bedside and will be sent home to continue 2 puffs  q.i.d. p.r.n.Marland Kitchen  She is already on Advair puffer at home and was  encouraged to comply with that.  She was also encouraged to pick up some  Mucinex over-the-counter and take that b.i.d.   DISCHARGE MEDICATIONS:  1. Albuterol inhaler 2 puffs q.i.d. p.r.n.  2. Avelox 400 mg every evening x7.  3. PhosLo 667 mg 2 with meals.  4. Klonopin 0.5 mg predialysis.  5. Cymbalta 60 mg b.i.d.  6. Advair  1 puff b.i.d.  7. Nu-Iron 150 mg daily.  8. Ferritin 10 mg daily.  9. Nexium 40 mg daily.  10.Mysoline 250 mg b.i.d.  11.Nephro-Vite vitamin 1 daily.  12.Renagel 800 mg 2 with meals.  13.Topamax 200 mg b.i.d.   Dialysis switching to new schedule; Monday, Wednesday, and Friday at the  [facility].      Zenovia Jordan, P.A.      Maree Krabbe, M.D.  Electronically Signed    RRK/MEDQ  D:  09/12/2008  T:  09/12/2008  Job:  [id number]

## 2010-11-28 NOTE — Assessment & Plan Note (Signed)
OFFICE VISIT   [name], [name]  DOB:  Jul 28, 1955                                       12/24/2006  EAVWU#:[id number]   The patient returns today for evaluation of a poorly-functioning left  upper arm AV fistula, which has been present since July of 2007.  She  has 2 large aneurysmal areas and the fistula has not functioned well.  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  Also, the cephalic vein is totally occluded over about a 7 cm  segment centrally, and this area has had angioplasties in the past.  She  has excellent pulse and thrill in the fistula, but obviously, this is  not functioning well.   I feel the best plan would be to replace this fistula with an AV graft  into the axillary system to see if this will function better, and we  will ligate the fistula at that time.  Also will need Diatech  catheter,  and scheduled that for this Friday, June 13 at [facility] as an  outpatient. Quita Skye. Hart Rochester, M.D.  Electronically Signed   JDL/MEDQ  D:  12/24/2006  T:  12/24/2006  Job:  [id number]   cc:   Fayrene Fearing L. Deterding, M.D.

## 2010-11-28 NOTE — H&P (Signed)
[name], Stone NO.:  [id number]   MEDICAL RECORD NO.:  [id number]          PATIENT TYPE:  INP   LOCATION:  1828                         FACILITY:  [facility]   PHYSICIAN:  [name], M.D.DATE OF BIRTH:  Dec 12, 1955   DATE OF ADMISSION:  10/14/2007  DATE OF DISCHARGE:                              HISTORY & PHYSICAL   REASON FOR ADMISSION:  Shortness of breath, missed hemodialysis session.   PRIMARY CARE PHYSICIAN:  Dr. Lovell Stone.   NEUROLOGIST:  [name] Hammock. Thad Ranger, MD.   She dialyzes at [facility] on Tuesday, Thursday, Saturday.   HISTORY OF PRESENT ILLNESS:  This is a 55 year old, white woman with  past medical history of end-stage renal disease on hemodialysis Tuesday,  Thursday, Saturday started in July 2007, also with hypertension, tubular  sclerosis with history of seizures, and mental retardation.  She  presented the second time to the emergency room today (and had many  visits to the hospital this month for various reasons).  The first time,  she presented at 4 a.m. for generalized pain, 10 out of 10, developing  over the last three weeks after her last discharge from the hospital,  but becoming worse this a.m.  She was d/c 'd from the ER today and sent  to have her hemodialysis, but she felt short of breath and decided not  to go to her hemodialysis session but returned to the hospital.  She  said that she also missed her Saturday hemodialysis but had it on  Friday.  She also says that she had 16 seizures in the last two weeks,  although she was taking her medications.  She says that she feels sad  and depressed about her mother's death anniversary.  Hemodialysis  frequency: three times a week, estimated dry weight: 137 kg (per her  report 135 kg), duration: four hours and 15 minutes, access: right chest  catheter.   HOME MEDICATIONS:  1. Topamax 200 mg p.o. b.i.d.  2. PhosLo 1334 mg p.o. t.i.d. with meals.  3. Klonopin 1 mg  p.o. q.h.s.  4. Protonix 40 mg p.o. daily.  5. Advair one puff p.o. b.i.d.  6. Paxil 30 mg p.o. q.h.s.  7. Aranesp 200 mg IV on Tuesdays with hemodialysis.  8. Primidone 250 mg p.o. t.i.d.  9. Ansaid 50 mg IV on Thursdays with dialysis.  10.Oxycodone one tablet p.o. q.4h p.r.n.  11.Docusate b.i.d.  12.MiraLax once a day.   SOCIAL HISTORY:  She lives in [place] in [place], she  lives alone, she is not married, does not have children, denies tobacco,  alcohol, and drug use.  Her brother is close to her and he prompted her  to come to the hemodialysis today.   FAMILY HISTORY:  Mother is deceased, father is alive and well but has  diabetes, brother has asthma.   REVIEW OF SYSTEMS:  She denies fever or chills, sweats, but stays cold,  has headaches, no rashes, has chest pain at the catheter site, the  shortness of breath for the last few weeks, but no orthopnea, has a  cough  with green mucus, but no blood.  She denies edema, palpitations,  wheezing.  She makes no urine.  She admits for weakness and depression.  She has nausea always per her report, and she had bright red blood per  rectum one week ago.  She also has abdominal pain, which is diffuse.  She complained of right-sided pain at the site of a tumor in her back.  She said that she had generalized pain for the last few weeks and also  that her catheter site was bleeding last night.   PHYSICAL EXAMINATION:  VITAL SIGNS:  Temperature 96.9, pulse 91,  respiration rate 19, blood pressure 162/97, oxygen saturation 98% on  room air.  GENERAL EXAM:  She was moaning, breathing loudly, she had a flat affect.  HEENT:  Extraocular movements intact, sclerae clear, moist mucous  membranes.  NECK:  No JVD.  CARDIOVASCULAR:  Regular rate and rhythm, no murmurs, rubs, or gallops.  LUNGS:  Clear to auscultation bilaterally without crackles.  GI:  Abdomen was soft, obese, but tender diffusely to palpation, no  rebound, no  guarding.  EXTREMITIES:  She has increased edema, pitting, +3.  She has a catheter  in the right side of her chest, which looks clean.  NEURO:  Alert and oriented x3, but poor insight of her disease.   Chest x-ray shows interstitial edema, mild left basilar atelectasis,  mild cardiomegaly.   LABORATORY DATA:  She had a 2D echo at the previous admission showing an  EF of 65% with no left ventricular wall motion abnormality.  On Myoview,  she had fixed defect in the anterior wall and anterior septum and an EF  of 63%.  She also had lateral wall hypokinesia.  A V/Q scan at that time  showed a low likelihood for a PE.  White blood count 4.2, PMN 74%, hemoglobin 10.3, hematocrit 30.5, MCV  104.1, platelets 165.  Sodium 134, potassium 6.4, chloride 101, bicarb  13, BUN 110, creatinine 14.27, and glucose 97.  Influenzae A and B were  negative.  AST was 11, ALT 15, bilirubin 1.0, A-phos 67, total protein  6.0, albumin 3.3, calcium 9.1.   ASSESSMENT AND PLAN:  This is a 55 year old woman with past medical  history of tubular sclerosis, mental retardation, and seizures, end-  stage renal disease on hemodialysis, presenting with fluid overload and  hyperkalemia after she missed a hemodialysis treatment (her last  hemodialysis treatment being four days ago).  1. Fluid overload:  Chest x-ray shows pulmonary interstitial edema.      She also has leg edema and shortness of breath.  She will need to      have a hemodialysis tonight, and she might need another      hemodialysis tomorrow.  2. End-stage renal disease on hemodialysis:  Will dialyze today since      she missed a hemodialysis Saturday and Tuesday.  She has decreased      bicarbonate and hyperkalemia, too, prompting for emergent      hemodialysis.  Will aim for reaching to her estimated dry weight.      Will do 1 K bath, 4.15 hours, flow rate 400/800.  Will also give      Aranesp with dialysis.  3. Secondary hyperparathyroidism:  Will  stop PhosLo and will check a      phosphorus in the morning.  4. Anemia (Hb 10.3):  Will start Aranesp.  5. Hyperkalemia:  Her potassium was 6.4 this morning, we will dialyze  her and recheck potassium in the morning.  6. Anion gap metabolic acidosis likely secondary to uremia:      Bicarbonate decreased secondary to lack of hemodialysis.  Will      dialyze and recheck in the morning.  7. Generalized pain:  This is a fibromyalgia picture.  The pain is      likely related to depression.  She is on Paxil and has a      psychiatrist, which she should resume seeing after discharge.  It      is a low likelihood that this is a sepsis,      but we will check blood cultures since she has the catheter in      place.  Of note, she is not hypotensive and not febrile.  8. Prophylaxis:  Protonix and Lovenox.      Carlus Pavlov, M.D.  Electronically Signed     ______________________________  [facility] Eliott Stone, M.D.    CG/MEDQ  D:  10/14/2007  T:  10/15/2007  Job:  [id number]

## 2010-11-28 NOTE — Op Note (Signed)
[name], MINAHAN NO.:  [id number]   MEDICAL RECORD NO.:  [id number]          PATIENT TYPE:  INP   LOCATION:  6730                         FACILITY:  [facility]   PHYSICIAN:  Bernette Redbird, M.D.   DATE OF BIRTH:  05-07-1956   DATE OF PROCEDURE:  DATE OF DISCHARGE:                               OPERATIVE REPORT   PROCEDURE:  Flexible sigmoidoscopy with biopsy.   INDICATIONS:  A 55 year old dialysis patient with mild mental  retardation who has had a tendency toward constipation for which she  uses MiraLax at home, and more recently in the hospital, has noticed  rectal bleeding in the pattern suggestive of hemorrhoids, color in the  toilet water red.   FINDINGS:  Multiple diminutive rectal polyps.  No definite source of  bleeding identified.   PROCEDURE:  The nature, purpose, and risks of the procedure had been  discussed with the patient's god mother, Christinia Gully [phone]) at  the bedside and she provided written consent on Rudean's behalf since  she is her power of attorney.  The procedure was done unprepped and  without sedation.  The patient was brought from her hospital room to the  endoscopy unit.   The Pentax pediatric video colonoscope was inserted and easily advanced  to about 40 cm and pullback was then performed.  I estimated I reached  the distal descending colon area.   There was quite a bit of formed stool in the distal rectum, but with  irrigation, the stool balls were able to be moved out of position fairly  well, so that I could see underneath most of them and verify the absence  of any stercoral ulcerations or significant rectal polyps or masses.  There was no evidence of proctitis, diverticular disease, vascular  ectasia, or other abnormalities up to the limit of the exam, except for  the presence of numerous (probably 10 or 12) diminutive hyperplastic-  appearing sessile polyps in the rectum, which were cold biopsies.   I did not  retroflex in the rectum due to the proximity of the biopsies  and due to the presence of the stool, but antegrade viewing disclosed no  additional findings.   Pullout through the anal canal did not show significant hemorrhoids,  rather, just mild to moderate internal hemorrhoids.  I did not see any  fissure.   The patient tolerated the procedure well.  There were no apparent  complications.   IMPRESSION:  1. Rectal bleeding, most likely from periodic hemorrhoidal      engorgement, although the current appearance of the      hemorrhoids is rather unimpressive.  2. Multiple diminutive rectal polyps.   PLAN:  Await pathology results.  Continue MiraLax for constipation.           ______________________________  Bernette Redbird, M.D.     RB/MEDQ  D:  03/23/2008  T:  03/24/2008  Job:  [id number]   cc:   Stacie Glaze, MD  [facility] Nine, M.D.

## 2010-11-28 NOTE — H&P (Signed)
[name], [name] NO.:  [id number]   MEDICAL RECORD NO.:  [id number]          PATIENT TYPE:  INP   LOCATION:  1824                         FACILITY:  [facility]   PHYSICIAN:  Aram Beecham B. Eliott Nine, M.D.DATE OF BIRTH:  Aug 08, 1955   DATE OF ADMISSION:  02/15/2008  DATE OF DISCHARGE:                              HISTORY & PHYSICAL   REASON FOR ADMISSION:  Life-threatening hyperkalemia.  This is a 55-year-  old white female who has a history of end-stage renal disease (with  history of bilateral nephrectomies) whose scheduled dialysis is at the  [facility] on a Tuesday, Thursday, Saturday schedule.  She  makes frequent trips to the emergency department because of missed  hemodialysis treatments.  She presented to the ED today having had no  hemodialysis for 1 week and no adequate or plausible explanation for why  she missed her treatment other than It makes me nervous to ago over  there.  Her laboratory studies were notable for a potassium level of  7.9, without visible hemolysis, and her electrocardiogram showed peaked  T-waves, absence of P-waves and wide QRS complex.  She received glucose,  insulin and 1 amp of sodium bicarbonate in the emergency department, and  later an amp of calcium.  She was brought up to the dialysis unit for  emergency dialysis.  There were initial problems with her PermCath but  we were able to get that functioning.   As previously noted, she had no plausible explanation for why she had  missed dialysis.  She reported going to the [facility] ER to get  checked out earlier in the week where she was thought to have some sort  of a viral syndrome.   With regard vascular access, she has a left upper arm straight graft  which was placed at [facility] 4-6 weeks ago (exact date not available), which  has not yet been cannulated but was due to be used this week.  She still  has a PermCath in the right neck.   PAST MEDICAL HISTORY:  1. ESRD  on Tuesday, Thursday, Saturday hemodialysis at [facility].  2. Seizure disorder.  3. History of tuberous sclerosis.  4. Mild mental retardation.  5. GERD.  6. Secondary hyperparathyroidism.  7. History of bilateral nephrectomies.  8. Vascular access issues with maturing left arm graft placed at [facility].  9. SEASONAL ALLERGIES.  10.History of recurrent issues with missing hemodialysis and 1 prior      life-threatening hyperkalemic emergency which resulted in a      bradycardic arrest several months ago   CURRENT MEDICINES:  1. Topamax 200 mg b.i.d.  2. PhosLo 3 with each meal.  3. Paxil 30 mg a day.  4. Primidone 250 mg b.i.d.  5. Multivitamins once a day.  6. Renagel 800 three with each meal.  7. Aleve as needed.  8. Advair Diskus 2 puffs twice a day.  9. Celebrex p.r.n. for joint pain.  10.Aspirin 325 a day.  11.Nexium 40 mg b.i.d.   SHE HAS ALLERGIES TO CONTRAST AND ALSO REPORTS AN ALLERGY TO MEDROL AS  WELL AS  TO A MEDICATION CALLED TRIDIL (?).   Her dialysis prescription, EPO dosing, Vitamin D dosing etc. are not  known at this time, as the outpatient dialysis unit records are not  available.   SOCIAL HISTORY:  Patient's parents are deceased.  She does have a  brother.  She lives alone and her family is not extremely involved with  her care.  She does have someone who takes her back and forth to  appointments occasionally.  She previously worked at [employer] and has a  10th grade education.  She does not smoke or drink.   REVIEW OF SYSTEMS:  Is positive for fatigue.  She had an episode of  epistaxis.  She was reporting flu-like symptoms earlier this week.  She  has had issues with shortness of breath and edema of the lower  extremities, weakness, anxiety, but no headache, nasal discharge,  photophobia hearing loss.  She has had a mild cough.  No cold or heat  intolerance, fevers or chills.  No GU symptoms (she had bilateral  nephrectomies), no arthralgias, nausea, vomiting,  diarrhea, bloody  stools or black tarry sticky stools.   PHYSICAL EXAMINATION:  Reveals a morbidly obese and somewhat nonchalant-  behaving white female.  Temperature 97.4, heart rate 73, respiratory  rate 18, blood pressure 126/74.  She was in some mild respiratory  distress.  Color was gray to sallow in appearance.  Sclerae were  nonicteric.  NECK:  Supple, without adenopathy.  She had a right-sided PermCath with  no drainage.  CARDIAC:  Rhythm was regular S1-S2, no audible S3.  ABDOMEN:  Obese with a large panniculus but no focal abdominal  tenderness.  There was 2+ edema of both lower extremities.  There was a straight  graft in the left upper arm with a healed incision which had a good  thrill and bruit and a PermCath in the right chest.   ISTAT labs from the emergency department revealed a hemoglobin of 8.8,  hematocrit 26, sodium 129, potassium 7.9.  BUN greater than 140,  creatinine 15.5.  Chest x-ray showed interstitial edema and cardiomegaly  with the PermCath in place.   IMPRESSION:  1. Life-threatening hyperkalemia secondary to missing hemodialysis for      over a week.  This is the second time patient has come close to      death from hyperkalemia, and she has had MANY ER visits, several of      which have required emergency dialysis.  She cannot provide      plausible explanations for why she misses dialysis but this      behavior has been repeated over and over.  She has refused      placement in the past (during a June 9th admission, an assisted      living environment was set up for her and at the last minute she      declined to go there).  Her repeated episodes of missing dialysis,      yet her statement in the same breath that she does not want to die,      are contradictory.  She needs to be seen and evaluated by      psychiatry.  The best option for her obviously would be placement,      but I suspect this will not happen.  Will get psychiatry involved       in the morning.  2. End-stage renal disease - Her usual schedule as Tuesday, Thursday,  Saturday for hemodialysis.  This is her first treatment in a week.      She will likely require dialysis again tomorrow.  Will plan to      dialyze her for 4 hours tonight and check a follow-up potassium 2      hours post treatment.  Will write new dialysis orders for tomorrow      in the morning.  Her regular schedule as Tuesday, Thursday and      Saturday.  3. Seizure disorder.  Continue Topamax and primidone.  4. History of tuberous sclerosis.  5. Anemia.  Hemoglobin is low at 8.8.  She has had no Procrit for a      week.  I do not know her outpatient dose.      We will obtain those records from the [facility] in      the morning.  6. Secondary hyperparathyroidism - She does take binders at home.      Vitamin D dose is unknown to me at the present time.  Will also      need to get this from her outpatient dialysis records.      [facility] Eliott Nine, M.D.  Electronically Signed     CBD/MEDQ  D:  02/15/2008  T:  02/15/2008  Job:  [id number]   cc:   [facility]

## 2010-11-28 NOTE — Op Note (Signed)
[name], [name]             ACCOUNT NO.:  [id number]   MEDICAL RECORD NO.:  [id number]          PATIENT TYPE:  AMB   LOCATION:  SDS                          FACILITY:  [facility]   PHYSICIAN:  Larina Earthly, M.D.    DATE OF BIRTH:  1956/04/19   DATE OF PROCEDURE:  04/28/2007  DATE OF DISCHARGE:  04/28/2007                               OPERATIVE REPORT   PREOPERATIVE DIAGNOSIS:  End-stage renal disease, with occlusion of left  upper arm arteriovenous Gore-Tex graft.   POSTOPERATIVE DIAGNOSIS:  End-stage renal disease, with occlusion of  left upper arm arteriovenous Gore-Tex graft.   PROCEDURE:  Attempted thrombectomy of left upper arm AV Gore-Tex graft,  followed by ligation of the axillary vein.   SURGEON:  Larina Earthly, M.D.   ASSISTANT:  Nurse.   ANESTHESIA:  MAC.   COMPLICATIONS:  None.   DISPOSITION:  Recovery room, stable.     The patient had multiple attempts at left forearm grafts and had a  left upper arm graft placed on March 28, 2007.  She has had  subsequent occlusion and has had attempted thrombolysis 3 days ago which  was unsuccessful.  She is taken to the operating room at this time for  thrombectomy and revision.   PROCEDURE IN DETAIL:  The patient was taken to the operating room and  placed on the operating table in the supine position, where the area of  the left arm and left axilla were prepped and draped in the usual  sterile fashion.  Incision was made over the prior axillary incision and  carried down to isolate the graft to vein.  The vein was quite  sclerotic.  The vein was exposed up as high as could be reached in the  axilla and was still sclerotic.  This was then end-to-end anastomosis.  The old anastomosis was taken down.  The vein was opened and spatulated  up as far as could be reached in the axilla.  It was still quite  thickened.  A 5 dilator would pass with the great resistance.  A Fogarty  catheter would pass but would not allow  any back bleeding.  There was an  accessory vein alongside the axillary artery.  This was exposed as well  and was opened and would not allow for a dilator to pass.  It appeared  to be occluded centrally as well.  For this reason, further attempts at  thrombectomizing the vein were abandoned, since there was no outflow  vein in the axilla on the left.  The vein was ligated with 2-0 silk  ties, and the old Gore-Tex  graft which was occluded was also ligated.  The wound was irrigated with  saline.  Hemostasis was obtained with cautery.  The wounds were closed  with 3-0 Vicryl in the subcutaneous and subcuticular.  Benzoin and Steri-  Strips were applied.      Larina Earthly, M.D.  Electronically Signed     TFE/MEDQ  D:  04/28/2007  T:  04/29/2007  Job:  [id number]

## 2010-12-01 NOTE — Discharge Summary (Signed)
[name], BILLS NO.:  [id number]   MEDICAL RECORD NO.:  [id number]          PATIENT TYPE:  INP   LOCATION:  6704                         FACILITY:  [facility]   PHYSICIAN:  Leighton Roach McDiarmid, M.D.DATE OF BIRTH:  1956/02/18   DATE OF ADMISSION:  11/08/2005  DATE OF DISCHARGE:  11/13/2005                                 DISCHARGE SUMMARY   DISCHARGE DIAGNOSES:  1.  Large angiomyolipoma of the left kidney.  2.  Anemia secondary to blood loss.  3.  Status post right nephrectomy.  4.  Tubular sclerosis.  5.  Abdominal pain now resolved  6.  History of seizure disorder with last seizure in February 2007.  7.  Asthma.  8.  Depression  9.  Gastroesophageal reflux disease.  10. Obesity.  11. Anxiety.  12. Baseline chronic kidney disease stage IV with baseline creatinine of 3.5      with GFR of around 15.   DISCHARGE MEDICATIONS:  1.  Advair 500/50 one puff b.i.d.  2.  Clonidine 0.1 mg p.o. b.i.d.  3.  Zoloft 50 mg p.o. q.d.  4.  Topamax 200 mg p.o. b.i.d.  5.  Claritin 10 mg p.o. q.d.  6.  Iron complex 150 mg p.o. b.i.d.  7.  Primidone 300 mg b.i.d.  8.  Colace 100 mg p.o. t.i.d.  9.  Nexium 40 mg daily.  10. Flexeril 10 mg p.o. q.h.s.  11. Oxycodone 5 mg tablets take every four to six hours as needed for pain.  12. Tylenol 650 mg q.6h. p.r.n. pain.  13. Allegra 60 mg p.o. b.i.d. as needed   CONSULTS:  Renal, Dr. Arrie Aran was consulted for patient with a solitary  kidney with history of chronic renal insufficiency with noted solitary  kidney with a renal mass and she was followed for her creatinine in  preparation for dialysis.  Patient had a vein mapping with anticipation in  case she needed hemodialysis which showed that her left arm at basilic vein  was easily visualized in the left upper arm patent and compressible  measurements before and left cephalic vein that was easily visualized and  appears patent and compressible at the surface below.  Renal  will be  following her up as an outpatient for her blood loss anemia.  Renal  recommended that the patient had maintain a hemoglobin of 11-12 and possibly  begin Aranesp 100 mg subcutaneous every week with possible iron.  Patient  will be sent home on iron and will be followed up with Dr. Arrie Aran as an  outpatient.  For her chronic kidney disease stage IV she is at baseline at  this time.  She will need outpatient follow-up with the next four to six  weeks with Dr. Arrie Aran as well as her hypertension.  It was suggested  that she start on a clonidine while as an inpatient prior and no ARBs.  Will  continue this clonidine and have renal to assess.  As for vascular access  the vein mapping and patient will require official placement as an  outpatient.   PROCEDURES:  Patient had a CT  of abdomen on April 26 which showed a large  complex hematoma malformation which showed with angiomyolipomas with  interval growth and acute hemorrhage when compared to October 18, 2001.  There  were multiple ventral hernias noted.  There is no acute pelvic  abnormalities.  There was extensive left renal and pararenal tumors  malformation extending down into the left upper and mid pelvis with mass  effect on the bowel loops.  Repeat CT scan was obtained on the 27th which  showed similar signs of a nearly 19 cm mass in the left renal fossa likely  related to numerous angiomyolipomas.  Of note, there is a fat density within  the left renal vein extending to the IVC consistent with tumor extension.  There was hemorrhage inferiorly but this was recently on yesterday's  examination which probably angiomyolipomas in the liver with hepatomegaly.  There is small indeterminate right middle lobe lung nodules.  There is a  small hiatal hernia and extensive ventral hernias as described.  Patient was  status post right nephrectomy and there was no new acute findings on her CT  of her pelvis.   BRIEF HOSPITAL COURSE:   Patient is a 55 year old white female with a history  of tubular sclerosis who developed sudden onset of left lower quadrant  abdominal pain and vomiting this morning.  She had no prior abdominal pain  and no diarrhea, no fevers, no sick contacts.  Patient has a history of  angiomyolipomas in the left kidney and she was seen at [facility] previously in  March 2007 by nephrologist, Dr. Sydnee [name].  Her last creatinine at that time  was a 3.5 and her CT scan today shows a large angiomyolipoma with an acute  bleed.  Patient has a history of status post right nephrectomy for this same  etiology.  There is no blood in her urine.  She did vomit twice on the day  of admission which was non-bilious and non-bloody.  There is a slight  decrease in pain after she vomited.  There was no blood in the stool that  she noted, no recent lower extremity edema.  She had no chest pain.  Pain  was well controlled with Dilaudid in the ED.  Please see the dictated H&P on  November 08, 2005.   BRIEF HOSPITAL COURSE:  Patient was admitted and placed on the family  practice teaching service as an unassigned patient.  Patient was monitored  closely with hemoglobin and hematocrit to evaluate for acute blood loss.  Patient's pain was well controlled with morphine 4 mg IV q.4h. p.r.n. and  Percocet q.6h. p.r.n.  Patient tolerated the pain well.   #1 - ANEMIA SECONDARY TO BLOOD LOSS:  Her hemoglobin on day of admission was  noted to be 10.8.  On day prior to discharge her hemoglobin was noted to be  8.4.  It was suggested by the renal team that her baseline hemoglobin be 11-  12.  Patient was symptomatic at the day prior to discharge with dizziness  and was decided to be transfused 2 units of packed red blood cells at this  time to tank her up.  Patient had a repeat CT scan on April 27 for her  hemoglobin dropping from the 10s down into the 1 g which showed no worsening of her bleed.  Patient's pain remained stable and was well  controlled p.o.  pain medicines at time of discharge.  Patient will be sent home on Tylenol  and oxycodone  for p.r.n. pain management.  The patient will followed up by  Dr. Arrie Aran here as an outpatient for questionable hemodialysis as well  as Dr. Stephannie Peters at [facility] as her urologist.  Urology was consulted for the large  status of the mass; however, they stated that at this time with only one  functioning kidney she would only be a candidate if her hemoglobin continued  to drop and her worsening with CT scan of blood loss.  Patient could  possibly be a candidate for arterial embolization; however, her blood loss  was noted to be stable at time of discharge.   #2 - ANGIOMYOLIPOMA:  Please see #1.  Patient will be followed up by Dr.  Arrie Aran as well as Dr. Sydnee [name] at [facility].  It was noted to be a 19 cm  mass plus/minus.  Patient had minimal pain well controlled with oxycodone  and Tylenol.   #3 - HISTORY OF TUBULAR SCLEROSIS:  The patient has been treated at [facility].  She does have a seizure disorder related to a tubular sclerosis.  She was  continued on her Topamax and primidone as an outpatient.   #4 - STAGE IV KIDNEY DISEASE:  We appreciate a renal consult.  Her vein  mapping was done.  She will have follow-up as an outpatient.  Her creatinine  on day of discharge was down to 3.5 which was thought to be her baseline.  Patient will be followed as an outpatient and will eventually need  hemodialysis.   #5 - PAIN CONTROL:  Patient was doing well with Percocet.  We will continue  her on these separated medicines.   #6 - GI:  Patient had not had a bowel movement since she had been here.  We  went ahead and continued her on her Colace as an outpatient 100 mg.  We  offered her MiraLAX as well on day of prior to discharge.  Currently,  patient has not had bowel movement but plan is to have a bowel no prior to  discharge.   #7 - HYPERTENSION:  Her blood pressure was stable 99s/65 to  120s/67.  She  was placed on clonidine 0.1 mg b.i.d. while in the hospital secondary to her  poor kidney function.  Patient will be continued on clonidine as an  outpatient at this time.  This can be further evaluated by the primary M.D.  prior to discharge or may be further followed up by her primary M.D. at  [facility].   #8 - ANXIETY AND DEPRESSION:  We continued her Zoloft as an outpatient.   #9 - BACK PAIN:  We placed her on Flexeril which seemed to improve the  muscle spasm pain.   #10 - SEASONAL ALLERGIES:  We continued her on Claritin and Allegra.   FOLLOW UP:  Patient will be followed up by Dr. Arrie Aran at [facility] as well as Dr. Sydnee [name] at [facility] as an outpatient as previously  scheduled.  Patient will need a follow-up creatinine as well as hemoglobin  as well as iron therapy for her anemia      Barth Kirks, M.D.    ______________________________  Leighton Roach McDiarmid, M.D.   MB/MEDQ  D:  11/12/2005  T:  11/13/2005  Job:  [id number]   cc:   Terrial Rhodes, M.D.  Fax: [phone]   [name], M.D.  [facility]

## 2010-12-01 NOTE — Op Note (Signed)
NAMEBRIANNI, MANTHE NO.:  [id number]   MEDICAL RECORD NO.:  [id number]          PATIENT TYPE:  INP   LOCATION:  6704                         FACILITY:  [facility]   PHYSICIAN:  James L. Malon Kindle., M.D.DATE OF BIRTH:  Aug 01, 1955   DATE OF PROCEDURE:  04/17/2006  DATE OF DISCHARGE:                                 OPERATIVE REPORT   PROCEDURE:  Esophagogastroduodenoscopy.   MEDICATIONS:  Cetacaine spray, Phenergan 6.25 mg, fentanyl 25 mcg, Versed  2.5 mg IV.   INDICATION:  Heme-positive stools in a 55 year old woman with history of  angiomyolipoma with multiple previous surgeries including two nephrectomies,  on chronic hemodialysis.   DESCRIPTION OF PROCEDURE:  The procedure was explained to the patient.  Consent obtained.  With the patient in left lateral decubitus position, the  Olympus scope was inserted  and advanced.  Patient was a bit agitated.  We  advanced the scope into the stomach.  The pylorus was identified and passed.  The duodenum including the bulb and second portion were seen well and were  unremarkable, without ulcerations or any other gross abnormalities.  The  scope was withdrawn back into the stomach.  The pyloric channel, antrum, and  body of the stomach were grossly normal.  In retroflex view the patient was  seen to have multiple tiny polyps up in the fundus, consistent with fundic  gland polyps.  Due to her movement, I elected not to biopsy those at this  time.  She had a hiatal hernia with a widely patent GE junction.  Distal  esophagus was grossly normal.  The scope was withdrawn.  The patient  tolerated the procedure well.   ASSESSMENT:  1. Small fundic gland polyps, possibly could be contributing to her      positive stool.  2. Heme-positive stool.   PLAN:  Will proceed at this time with colonoscopy and keep her on proton  pump inhibitors.           ______________________________  Llana Aliment Malon Kindle., M.D.     Waldron Session  D:   04/17/2006  T:  04/17/2006  Job:  [id number]   cc:   Stacie Glaze, MD  Wilber Bihari. Caryn Section, M.D.

## 2010-12-01 NOTE — Op Note (Signed)
NAMEDOLLY, HARBACH NO.:  [id number]   MEDICAL RECORD NO.:  [id number]          PATIENT TYPE:  INP   LOCATION:  5501                         FACILITY:  [facility]   PHYSICIAN:  Quita Skye. Hart Rochester, M.D.  DATE OF BIRTH:  1956/06/02   DATE OF PROCEDURE:  05/03/2006  DATE OF DISCHARGE:                                 OPERATIVE REPORT   PREOPERATIVE DIAGNOSIS:  Large pseudoaneurysm right thigh status post  insertion of Diatek catheter 2 weeks earlier.   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   OPERATIONS:  1. Evacuation of large hematoma from pseudoaneurysm right superficial      femoral artery.  2. Repair of arteriovenous fistula between right superficial femoral      artery and vein with (a) suture repair of superficial femoral vein and      (b) an interposition 6-mm Gore-Tex graft to replace segment of the      right superficial femoral artery.   SURGEON:  Quita Skye. Hart Rochester, M.D.   FIRST ASSISTANT:  Coral Ceo, P.A.   ANESTHESIA:  General endotracheal.   PROCEDURE:  Patient was taken to the operating room, placed in the supine  position at which time satisfactory general endotracheal anesthesia was  administered.  The right lower abdomen and right thigh were prepped Betadine  scrub and solution and draped in a routine sterile manner.  There was a  large pulsatile mass in the proximal thigh originating about 4 cm distal to  the inguinal crease and it was about 10 cm in diameter.  Short longitudinal  incision was made proximally and proximal control of the common femoral  artery was obtained, encircled with a vessel loop.  Flow seemed to be  hyperdynamic and was suspicious that this could be an arterial venous  fistula because of the continuous flow, although the venous flow was not  pulsatile but did have a continuous component to it.  The dissection then  continued  distally and this large pseudoaneurysm cavity was then entered and  the patient was given 3000 units of heparin intravenously.  A large hematoma  on the anterior thigh was evacuated and there was bleeding coming from the  superficial femoral artery in the depth of the wound which was controlled  digitally.  While this was performed for hemostasis, dissection continued  down from proximal to distal, profunda femoris artery was controlled and the  superficial femoral artery was then noted to have this large defect in the  anterior aspect and venous bleeding was in fact coming through this in  addition arterial bleeding and it was so clear that this was arteriovenous  fistula.  There was a through-and-through hole in the superficial femoral  artery about 5 cm distal to its origin which entered the superficial femoral  vein posteromedially.  Control of the superficial femoral artery distally  was obtained and the artery was then completely mobilized transecting the  fistula and the femoral vein was repaired with 5-0 Prolene sutures.  Artery  was then opened longitudinally through the defect and  the posterior wall  examined.  Initially, it a was felt that possible I could repair the  posterior wall and then vein patch the anterior wall because there was not a  large enough piece of vein to use as an interposition graft.  Attempted to  repair the hole in the posterior wall but the artery was so thin that it  would not hold sutures without tearing and it was clear that I had to  replace this segmental of artery.  Therefore, a short piece of 6 mm graft  was spatulated and interposed to replace about a 5-6 cm segment of  superficial femoral artery.  Anastomoses were done 6-0 Prolene proximally  and distally and clamps released and there was an excellent pulse.  A #4  Fogarty catheter had been passed to the ankle prior to this and no clot was  noted in the vessels.  Protamine was given to reverse the  heparin.  Following adequate hemostasis, a Jackson-Pratt drain was brought out through  an inferiorly based stab wound and secured with a nylon suture and the wound  was then closed in multiple layers with interrupted 2-0 Vicryl sutures and  skin clips.  Sterile dressing applied.  The patient taken to the recovery  room in satisfactory condition.           ______________________________  Quita Skye Hart Rochester, M.D.     JDL/MEDQ  D:  05/03/2006  T:  05/05/2006  Job:  [id number]

## 2010-12-01 NOTE — Consult Note (Signed)
[name] NO.:  [id number]   MEDICAL RECORD NO.:  [id number]          PATIENT TYPE:  INP   LOCATION:  3033                         FACILITY:  [facility]   PHYSICIAN:  Bevelyn Buckles. Champey, M.D.DATE OF BIRTH:  06-15-1956   DATE OF CONSULTATION:  DATE OF DISCHARGE:                                   CONSULTATION   REASON FOR CONSULTATION:  Seizure.   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  The patient has been followed at [facility]  for her seizures.  Her last seizures was 3-4 months ago.  Her seizures have  been well controlled in the past on Primidone and Topamax.  She will  occasionally have breakthrough seizure, occasionally, secondary to missed  dose of medication.  She is currently back to her baseline.  She denies any  headaches, dizziness, vertigo, focal weakness or numbness.  Supposedly her  seizure episode was typical as per nurse.   PAST MEDICAL HISTORY:  1. Tuberous sclerosis.  2. Seizure disorder.  3. Depression.  4. Asthma.   CURRENT MEDICATIONS:  1. Topamax.  2. Primidone.  3. Advair.  4. Zoloft.  5. Allegra.  6. Nexium.  7. Calcium.   ALLERGIES:  TRIDIONE.   FAMILY HISTORY:  Positive for diabetes, hypertension, CAD and cancers.   SOCIAL HISTORY:  The patient does not smoke or drink.   REVIEW OF SYSTEMS:  Positive as per HPI.  Review of systems negative as per  HPI and greater than seven other organ systems.   PHYSICAL EXAMINATION:  VITALS:  Temperature is 98.2, blood pressure 158/97,  pulse 75, respirations 30, O2 sat is 94%.  HEENT:  Normocephalic, atraumatic.  Extraocular muscles intact.  Pupils  equal and reactive to light.  NECK:  Supple.  No carotid bruits.  HEART:  Regular.  LUNGS:  Clear.  ABDOMEN:  Soft, nontender.  EXTREMITIES:  Show good pulses.  NEUROLOGICAL:  The patient is awake, alert, following commands  appropriately.  Language is fluent.  Cranial nerves II-XII grossly intact.  Motor examination shows 4+/5 strength.  Normal tone in all four extremities.  No drift is noted.  Sensory examination is within normal limits. Light touch  reflexes are trace to 1+  and symmetric.  Toes are neutral bilaterally.  Cerebellar function is within normal limits.  Finger-to-nose is not assessed  secondary to safety.  CT head showed no acute abnormalities and no  __________ change from prior CT scan.   LABORATORY DATA:  WBC is 5.8, hemoglobin is 9.18, hematocrit is 29.5,  platelets 214.  Sodium is 123, potassium is 4.8, chloride 90, CO2 is 22, BUN  39, creatinine 6.5, glucose 96, albumin 2.9, calcium is 8.7.  LFTs are  within normal limits.   IMPRESSION:  This is a 55 year old Caucasian female with history of complex  partial seizures, recorded breakthrough seizure.  Her breakthrough seizure  most likely secondary to a low-sodium infection  or electrolyte  abnormalities.  I agree with Primidone level and slowly correct her sodium  to prevent CPM.  Continue home medications.  At the current time, work on  infectious etiologies p.r.n. analysis, chest x-ray.  I would also follow  CBC, electrolytes and correct her BUN, creatinine as much as optimally as  can.  Would consider doing EEG if the patient has increased her seizure  activity.  We will follow the patient while she is Hospital.      Bevelyn Buckles. Nash Shearer, M.D.  Electronically Signed     DRC/MEDQ  D:  04/15/2006  T:  04/16/2006  Job:  [id number]

## 2010-12-01 NOTE — Op Note (Signed)
[name], [name]             ACCOUNT NO.:  [id number]   MEDICAL RECORD NO.:  [id number]          PATIENT TYPE:  AMB   LOCATION:  SDS                          FACILITY:  [facility]   PHYSICIAN:  Larina Earthly, M.D.    DATE OF BIRTH:  1955/08/23   DATE OF PROCEDURE:  02/01/2006  DATE OF DISCHARGE:                                 OPERATIVE REPORT   PREOPERATIVE DIAGNOSIS:  End-stage renal disease.   POSTOPERATIVE DIAGNOSIS:  End-stage renal disease.   PROCEDURE:  Creation of left upper arm AV fistula.   SURGEON:  Larina Earthly, MD   ASSISTANT:  Pecola Leisure, PA-C   ANESTHESIA:  MAC.   COMPLICATIONS:  None.   CONDITION TO RECOVERY:  Stable.   PROCEDURE IN DETAIL:  The patient was taken to the room, placed in supine  position, the area of the left arm prepped and draped in usual sterile  fashion.  Using local anesthesia, an incision was made over the antecubital  space, cut down to isolate the cephalic vein and brachial artery.  The vein  was of moderate size.  The artery was of normal caliber.  The cephalic vein  was mobilized further proximally and distally and tributary branches were  ligated with 3-0 and 4-0 silk ties and divided.  The vein was ligated  distally and divided, and was mobilized to the area of the brachial artery.  Artery was occluded proximally and distally and was opened with an 11-blade  and extended using Potts scissors.  The vein was spatulated and sewn end-to-  side to the artery with a running 6-0 Prolene suture.  Clamps were removed,  and good thrill was noted.  The wound was irrigated with saline.  The wound  was closed with 2-0 Vicryl in the subcutaneous, subcutaneous tissue, and  mini Steri-Strips were applied.  This was well tolerated.      Larina Earthly, M.D.  Electronically Signed     TFE/MEDQ  D:  02/01/2006  T:  02/01/2006  Job:  [id number]

## 2010-12-01 NOTE — Discharge Summary (Signed)
NAMEASPYNN, CLOVER NO.:  [id number]   MEDICAL RECORD NO.:  [id number]          PATIENT TYPE:  INP   LOCATION:  6704                         FACILITY:  [facility]   PHYSICIAN:  Wilber Bihari. Caryn Section, M.D.   DATE OF BIRTH:  06-Jul-1956   DATE OF ADMISSION:  04/15/2006  DATE OF DISCHARGE:  04/25/2006                                 DISCHARGE SUMMARY   DISCHARGE DIAGNOSES:  1. Seizure disorder.  2. MRSA bacteremia.  3. End-stage renal disease.  4. Anemia.  5. Secondary hyperparathyroidism.  6. Hypertension.   PROCEDURES:  1. April 15, 2006 head CT without contrast.  Impression:  Intracranial      calcification, no acute findings, no interval change.  2. April 17, 2006 EGD by Dr. Carman Ching.  Assessment:  1) Small fundic      gland polyp, possibly could be contributing to her positive stool.  2)      Heme-positive stool.  Plan:  To continue patient on proton pump      inhibitors.  3. April 17, 2006 colonoscopy by Dr. Carman Ching.  Assessment:  Heme-      positive stool.  The only significant finding on colonoscopy is      internal hemorrhoids.  Continue to treat the patient for reflux and      follow her clinically.  4. April 18, 2006 left intrajugular dialysis catheter discontinued by Dr.      Bonnielee Haff.  5. April 19, 2006 placement of right femoral Diatek catheter by Dr.      Waverly Ferrari after failed attempts in the right and left IJ as      well as right and left SCV.  6. April 22, 2006 placement of right intrajugular dialysis catheter and      internal radiology.  7. April 22, 2006 right femoral catheter discontinued by CVTS.   CONSULTATIONS:  1. Dr. Waverly Ferrari, CVTS.  2. Dr. Deneen Harts, neurology.  3. Dr. Carman Ching, GI.  4. Dr. Bonnielee Haff with interventional radiology.   HISTORY OF PRESENT ILLNESS:  This is a 55 year old Caucasian female admitted  to the emergency department from a short stay where she had a questionable  seizure  prior to a scheduled EGD and colonoscopy for heme-positive stool.  The patient does have a history of seizures as well as chronic renal  failure, mental retardation and angiomyolipomas.  She is reportedly heme-  positive which is why the colonoscopy was scheduled.  She receives  hemodialysis Tuesday, Thursday and Saturdays and the dialysis catheter is  not working, clogged.  The patient is scheduled for a new dialysis  catheter per interventional radiology today.   ADMISSION LABORATORY DATA:  pH 7.315, PCO2 46.1, bicarb is 23.5, PCO2 25,  base deficit is 3.0, WBC 5.8, hemoglobin 9.8, hematocrit 29.5, platelets  214, sodium 123, potassium 4.8, chloride 90, CO2 22, glucose 96, BUN 39,  creatinine 6.5, total bilirubin 8.7, alkaline phos is 58, AST 17, ALT 9,  total protein 6.6, albumin 2.9, calcium 8.7, CK-MB less than 1.0, troponin  less than 0.05, Mysoline 4.4, primidone less than 2,  blood cultures positive  for MRSA.   DIAGNOSTIC RADIOLOGICAL EXAMINATIONS:  1. April 15, 2006 two-view chest x-ray.  Impression:  1)  Mild congestive      heart failure.  2)  Left basilar atelectasis versus scar.  2. April 19, 2006 one-view abdomen.  Impression:  Femoral dialysis      catheter tip in the right common femoral vein is at the IVC right      atrial junction.  3. April 19, 2006 one-view chest x-ray.  Impression:  Cardiomegaly and      vascular congestion.  4. April 23, 2006 two-view chest x-ray.  Impression:  1)  New right IJ      dialysis catheter with the tips in the SVC/RA junction.  2)      Cardiomegaly, mild vascular congestion and bibasilar atelectasis.  3)      No pneumothorax.   HOSPITAL COURSE:  1. Seizure disorder.  The patient was admitted.  A head CT was performed      with the results as stated above.  Neurology was consulted.  Topamax      and Mysoline levels were obtained with results as stated above.      Neurology recommended increasing the Mysoline dosage which the  patient      seemed to tolerate well without difficulty.  She remained seizure-free      for the remainder of her hospital stay.  2. MRSA bacteremia.  Upon admission, it was noted that the patient had      purulent drainage at the site of her dialysis catheter.  Blood cultures      were obtained and empiric antibiotic therapy initiated.  Blood culture      results were positive as stated above.  The left IJ perm cath was      discontinued on April 18, 2006 as a result of the bacteremia.  The      patient underwent placement of a right femoral Diatek on April 19, 2006 to continue hemodialysis.  3. End-stage renal disease.  The patient was noted to be hyponatremic and      volume overloaded upon admission.  The patient received consecutive      hemodialysis treatments for fluid removal on both the volume overload      and decreased sodium did resolve.  Again as stated above, the patient's      left IJ dialysis catheter was discontinued as presumed to be the source      of the patient's MRSA bacteremia.  She received a right femoral Diatek      catheter on April 19, 2006 which the patient was extremely upset about      having a dialysis catheter in her femoral area.  As a result, the      patient underwent placement of a right IJ catheter and interventional      radiology on April 22, 2006 and her femoral catheter was later      discontinued.  The patient continued hemodialysis throughout her entire      hospital stay which she seemed to tolerate fairly well with an average      blood flow rate of 300, ultrafiltration of approximately four liters.      Patient's vital signs remained stable throughout her dialysis      treatments with systolic blood pressures ranging from mid-90s to 140s.  4. Anemia.  The patient was continued on Aranesp and iron therapy  throughout her entire hospital stay.  The patient did have a history of     heme-positive stool and the EGD and colonoscopy  that was initially      postponed secondary to the patient's seizure activity was later      performed during the patient's hospital stay with the results as stated      above.  Proton pump inhibitor therapy was continued with the      recommendations from GI.  The patient did have an episode of acute      decrease in her hemoglobin after her femoral dialysis catheter was      discontinued, the patient did receive 3 units of red blood cells at      that time and her hemoglobin did increase.  At the time of discharge,      the patient's hemoglobin is 8.5 and 25.5.  She remained without any      other obvious signs of bleeding throughout the remainder of her      hospital stay.  5. Secondary hyperparathyroidism.  The patient continued her outpatient      regimen of PhosLo therapy throughout her entire hospital stay.  The      patient seemed to tolerate well without difficulty and at the time of      discharge her calcium was 8.2 and phosphorous 4.7.  6. Hypertension.  The patient's blood pressure medication was originally      continued per her outpatient regimen upon admission, however during the      hospital stay it was noted that the patient's blood pressure was      slightly decreased with systolic blood pressure in the low 100s.  As a      result, the patient's beta-blocker therapy was discontinued and Norvasc      therapy was decreased.  On the day of discharge, the patient's systolic      blood pressure is ranging from the low 100s to 120s.   DISCHARGE MEDICATIONS:  1. Allegra 60 mg one p.o. b.i.d.  2. PhosLo 667 mg one mg p.o. t.i.d. with each meal.  3. Nexium 40 mg one p.o. q.h.s.  4. Zoloft 50 mg one p.o. daily.  5. Topamax 200 mg one p.o. b.i.d.  6. Advair 250/500 one puff b.i.d.  7. Nephrovite one p.o. daily.  8. Norvasc 5 mg one p.o. q.h.s.  9. MiraLax 17 gm one p.o. daily as needed for constipation.  10.Primidone 300 mg one p.o. b.i.d.  11.Ultracet 325/37.5 one p.o.  b.i.d. p.r.n. pain.   HEMODIALYSIS MEDICATIONS:  1. Epogen 29,500 units IV every hemodialysis treatment.  2. InFeD 50 mg IV every week with hemodialysis.  3. Vancomycin 1 gm IV every hemodialysis treatment through May 07, 2006.   DISCHARGE INSTRUCTIONS:  The patient is to resume a renal diet at 1200 mL  fluid restriction, activity as tolerated.  The patient must keep her  catheter site dry, no showers or swimming, sponge bathe only.  She is to  return to hemodialysis as scheduled on Saturday.  She has a followup  appointment with Dr. Lovell Sheehan on May 01, 2006 at 10:30.  Patient is to  call Dr. Lovell Sheehan if she develops any blood in her stool or go to the  emergency room if she develops a seizure.   HEMODIALYSIS INSTRUCTIONS:  Please obtain surveillance blood cultures on  May 21, 2006.  Please obtain a Vancomycin level pretreatment on April 27, 2006 and notify the Deshundra Waller with results.  Continue one-to-one stick with the patient's left upper extremity AV fistula.  Please obtain in-center  hemoglobin Saturday, notify Kellyn Mansfield if less than or equal to 8.0 and then a  hemoglobin pretreatment every week x3 weeks non-stat, again, notify Talaya Lamprecht  with results.  No other hemodialysis changes at this time.      Tracey P. Sherrod, NP    ______________________________  Wilber Bihari. Caryn Section, M.D.    TPS/MEDQ  D:  04/25/2006  T:  04/25/2006  Job:  [id number]

## 2010-12-01 NOTE — H&P (Signed)
[name], [name] NO.:  [id number]   MEDICAL RECORD NO.:  [id number]          PATIENT TYPE:  INP   LOCATION:  5501                         FACILITY:  [facility]   PHYSICIAN:  Quita Skye. Hart Rochester, M.D.  DATE OF BIRTH:  04-Jul-1956   DATE OF ADMISSION:  05/03/2006  DATE OF DISCHARGE:                                HISTORY & PHYSICAL   CHIEF COMPLAINT:  Right groin swollen.   HISTORY OF PRESENT ILLNESS:  The patient is a 55 year old Caucasian female  with past medical history of hypertension and end-stage renal disease.  She  was recently hospitalized on April 15, 2006, for seizure.  During this time  period, Dr. Edilia Bo was consulted for placement of a Ditech.  He placed a  right femoral Ditech on April 19, 2006.  Prior to discharge, the right  femoral Ditech was discontinued.  Following discontinuation of the Ditech, a  right groin hematoma was noted.  This was followed.  The patient was then  discharged home on April 25, 2006.  Since discharge home, the patient  states right groin has increasing pain, swelling, and causing her difficulty  walking.  She presented to the emergency room yesterday May 02, 2006.  In the emergency room, they attempted to drain the right groin hematoma.  This was unsuccessful.  The patient was instructed at that time to follow up  with Dr. Lovell Sheehan, the vascular surgeon.  Dr. Hart Rochester was consulted and the  patient was brought into the PV lab, where she underwent a right groin  ultrasound. A right groin ultrasound showed right femoral pseudoaneurysm.  The patient was seen and evaluated by Dr. Hart Rochester.  The patient currently  complains of severe right groin pain 9 out of 10 and difficulty walking.  She denies any recent fevers, night sweats, chills.  Denies any drainage  numbness, tingling, decreased temperature, motor function.   PAST MEDICAL HISTORY:  1. Hypertension.  2. History of anemia.  3. Secondary hyperparathyroidism.  4.  End-stage renal disease.  5. History of MRSA bacteremia.  6. Seizure disorder.  7. Asthma.   PAST SURGICAL HISTORY:  1. Status post multiple dialysis access.  2. Status post removal of bilateral kidneys.  3. Status post cholecystectomy.   ALLERGIES:  ALLERGIC TO:  1. __________ .  2. MEDROL.   MEDICATIONS:  1. Amlodipine 5 mg q.h.s.  2. Niferex 150 mg b.i.d.  3. Tramadol HCl 1 tablet p.o. b.i.d. p.r.n.  4. Nexium 40 mg daily.  5. Fexofenadine HCl 60 mg b.i.d.  6. Sertraline HCl 50 mg daily.  7. Topamax 200 mg b.i.d.  8. Primidone 250 mg b.i.d.  9. Primidone 50 mg b.i.d.  10.Atenolol 25 mg b.i.d.  11.Cyclobenzaprine 10 mg q.h.s.  12.Vitamin B12 500 mcg daily.  13.PhosLo 667 mg t.i.d.  14.Multivitamin daily.  15.Darvocet-N 100, one to two tablets every four to six hours p.r.n. pain.   SOCIAL HISTORY:  The patient is single.  No children.  Lives at home by  herself.  Denies any tobacco or alcohol use.  The patient resides in  [place] and is unemployed.  FAMILY HISTORY:  Noncontributory.   REVIEW OF SYSTEMS:  See HPI for pertinent positives and negatives.  The  patient denies any recent changes in vision, hearing difficulty, swallowing.  Denies any shortness of breath, cough, hemoptysis, wheezing.  Denies any  chest pain, palpitations, lightheadedness, dizziness, orthopnea, proximal  nocturnal dyspnea.  Denies any nausea, vomiting, abdominal pain, change in  bowel movements, diarrhea, constipation, melena, hematochezia.  Denies any  urgency, frequency, dysuria, hematuria.  Denies any muscle weakness,  diplopia, slurred speech, dysphagia, amaurosis fugax.   PHYSICAL EXAMINATION:  GENERAL:  Obese Caucasian female in no acute  distress.  VITAL SIGNS:  Not available.  HEENT:  Normocephalic, atraumatic.  Pupils equal, round, reactive to light  accommodation.  Extraocular movement intact.  Oral mucosa is pink and dry.  NECK:  Supple.  RESPIRATORY:  Clear to auscultation  bilaterally.  CARDIAC:  Regular rate and rhythm with distant heart sounds.  There is a 2/6  systolic ejection murmur noted.  ABDOMEN:  Bowel sounds x4.  Soft, nontender on palpation.  GENITOURINARY:  Deferred.  RECTAL:  Deferred.  EXTREMITIES:  The patient's right groin is swollen, tender, and warm to  touch.  Positive erythema right groin.  No drainage noted.  There is also a  erythematous area about 2.5 x 2.5-cm right lower extremity.  No signs of  ulceration or drainage.  The patient has 2+ bilateral radial, femoral,  dorsalis pedis, and posterior tibial pulses noted.  NEUROLOGIC:  Alert and oriented x3.  Muscle strength 5/5 upper and lower  extremities bilaterally.   IMPRESSION:  The patient is seen with a right femoral pseudoaneurysm.   She was seen and evaluated to Dr. Hart Rochester.  Dr. Hart Rochester discussed with the  patient undergoing surgery for repair of the right femoral pseudoaneurysm.  He discussed risks and benefits with the patient.  The patient acknowledged  her understanding and agreed to proceed.   PLAN:  Will be to admit the patient to [facility].  We will plan to  take the patient to the operating room today, May 03, 2006.      Theda Belfast, PA    ______________________________  Quita Skye Hart Rochester, M.D.    KMD/MEDQ  D:  05/03/2006  T:  05/04/2006  Job:  [id number]

## 2010-12-01 NOTE — H&P (Signed)
NAME:  [name], SLEEP NO.:  [id number]   MEDICAL RECORD NO.:  [id number]            PATIENT TYPE:   LOCATION:                                 FACILITY:   PHYSICIAN:  Terrial Rhodes, M.D.     DATE OF BIRTH:   DATE OF ADMISSION:  11/08/2005  DATE OF DISCHARGE:                                HISTORY & PHYSICAL   The consultation was requested by the family medicine teaching service.   REASON FOR CONSULTATION:  Solitary kidney with history of chronic renal  insufficiency.   HISTORY OF PRESENT ILLNESS:  The patient is a 55 year old female with a  history of tubular sclerosis and a right nephrectomy who presented with  abdominal pain today to the [facility] Emergency Room.  Her abdominal pain  started acutely this morning at around 10 a.m. and goes from the left side  and was extreme pain.  She had no previous symptoms that were similar.  When asking her in the emergency room today where the pain was located, she  points to the area diffusely across the left lower quadrant.  The patient  states the pain was not radiating.  She notes no blood in her urine.  She  did vomit twice today which was non-bilious and non-bloody.  She was  vomiting because of the pain. She said there was a slight decrease in the  pain after she vomited.  No blood in the stool that she noted.  She has had  no recent lower extremity edema.  She has had no chest pain.  So far in the  ED, she has had Dilaudid, Zofran, and morphine.  She had significant  decrease in her pain after Dilaudid.   PAST MEDICAL HISTORY:  1.  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  2.  Obesity.  3.  Gastroesophageal reflux disease.  4.  Seizure disorder.  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  5.  Anxiety.  6.  Asthma.  7.  Baseline chronic kidney disease, stage IV with baseline creatinine of      3.5, GFR  of around 15.   FAMILY HISTORY:  Father has had 2 CVAs; he is alive at age 92.  Mother died  in 74 of a car accident.  She was evidently healthy before the car  accident.   SOCIAL HISTORY:  She lives alone.  She does not use tobacco or alcohol.  She  is unemployed.   DRUG ALLERGIES:  TRIDIONE.   MEDICATIONS:  1.  Advair 500/50.  2.  Calcium 600 twice daily.  3.  Nexium 40 mg p.o. daily.  4.  Topamax 200 mg p.o. twice daily.  5.  Zoloft 50 mg p.o. daily.  6.  Allegra 60 mg p.o. twice daily.  7.  Glucosamine chondroitin 1200/1500 p.o. daily.  8.  Niferex 150 mg p.o. twice daily.   The patient is not on any antihypertensives.   REVIEW OF  SYSTEMS:  As above.   PHYSICAL EXAMINATION:  VITAL SIGNS: Temperature 97.7, pulse 73, respiratory  rate 20, blood pressure 157/93.  O2 saturation 95% on room air.  GENERAL: The patient is in no apparent distress.  She has decreased  abdominal pain now after the Dilaudid.  HEENT: She does have tumors on her face.  Mucous membranes are mildly dry.  NECK:  Supple.  No lymphadenopathy appreciated.  CARDIOVASCULAR:  Normal S1 and S2.  No murmurs, rubs, or gallops  appreciated.  CHEST: Clear to auscultation bilaterally.  Cardiopulmonary exam is limited  by habitus.  ABDOMEN:  The patient has a tender mass palpable on the left abdomen.  She  has bowel sounds otherwise.  The remainder of the abdominal exam is benign  with a soft belly.  EXTREMITIES: Obese but no edema noted on lower extremities, 2+ dorsalis  pedis pulses.  SKIN:  Rapid capillary refill.  NEUROLOGIC: Grossly intact.   LABORATORY AND X-RAY DATA:  CT shows a large angiomyolipoma around the left  kidney with hemorrhage. There is a nephrectomy noted.   Hemoglobin 10.8, white count 12.3, platelets 230.  Heme negative.  Electrolytes: Sodium 140, potassium 4, chloride 111, bicarb 21.6, BUN 41,  creatinine 3.1, glucose 110.  UA shows 7 to 10 white blood cells per high  power field and  0 to 2 rbc's per high power field.  UA shows trace  hemoglobin, 100 protein, nitrite negative, and moderate leukocyte esterase.  Urine pregnancy is pending.   ASSESSMENT:  The patient is a 54 year old female with the following  problems.  1.  Solitary kidney.  2.  Renal mass.  3.  Chronic kidney disease, stage IV, with GFR of around 15.   PLAN:  1.  Regarding patient's solitary kidney and her baseline chronic kidney      disease, we will follow her creatinine and prepare her for dialysis if      needed.  We will go ahead and get vein mapping in anticipation of      catheter placement. She is not in acute need of dialysis right now.  2.  As far as her angiomyolipoma is concerned, the primary team will consult      urology.  In the meantime, we will follow serial hemoglobin and      hematocrit. It is likely her abdominal pain is due to tumor bleed that      is stretching the capsule of the mass.      Dwana Curd Para March, M.D.    ______________________________  Terrial Rhodes, M.D.    GSD/MEDQ  D:  11/08/2005  T:  11/08/2005  Job:  [id number]

## 2010-12-01 NOTE — Op Note (Signed)
NAMEKENLEA, WOODELL NO.:  [id number]   MEDICAL RECORD NO.:  [id number]          PATIENT TYPE:  INP   LOCATION:  6704                         FACILITY:  [facility]   PHYSICIAN:  James L. Malon Kindle., M.D.DATE OF BIRTH:  May 05, 1956   DATE OF PROCEDURE:  04/17/2006  DATE OF DISCHARGE:                                 OPERATIVE REPORT   PROCEDURE:  Colonoscopy.   MEDICATIONS:  Patient received a total of Phenergan 6.25 mg, fentanyl 25 mg  and Versed 5 mg for both procedures.   SCOPE:  Adult colonoscope.   INDICATION:  Heme-positive stools in a woman with angiomyolipoma.  Lost both  kidneys, on hemodialysis.  Has a seizure disorder and multiple other  problems.  She presented for an outpatient endo/colon, had a seizure, had to  be postponed.  She was admitted to the hospital.  She is having issues with  her dialysis access.  The endoscopy just prior to this revealed some gastric  polyps.   DESCRIPTION OF PROCEDURE:  The procedure had been explained to the patient,  consent obtained.  In the left lateral decubitus position, the scope was  inserted and advanced.  The prep was marginal.  The patient had been prepped  a couple of days ago as an outpatient.  Did not want to take any more prep  but did agree to take some MiraLax.  There was sticky, adherent stool around  the colon.  No diverticulosis.  We were able to reach the cecum.  It was  dirty.  It was washed.  No gross lesions were seen.  Small AVMs or polyps  could have been missed.  We vigorously irrigated the colon and there were no  mass, lesions, no significant diverticulosis whatsoever.  No polyps seen.  The scope was withdrawn to the rectum.  This was seen well after vigorous  irrigation.  Patient had internal hemorrhoids seen in the retroflex view.  No other abnormalities were seen.  The scope was withdrawn.  The patient  tolerated the procedure well.   ASSESSMENT:  Heme-positive stool.  The only significant  finding on  colonoscopy is internal hemorrhoids.   PLAN:  Will continue to treat the patient for reflux as she has been, and  follow her clinically.           ______________________________  Llana Aliment Malon Kindle., M.D.     Waldron Session  D:  04/17/2006  T:  04/17/2006  Job:  [id number]   cc:   Stacie Glaze, MD  Wilber Bihari. Caryn Section, M.D.

## 2010-12-01 NOTE — Discharge Summary (Signed)
[name], [name] NO.:  [id number]   MEDICAL RECORD NO.:  [id number]          PATIENT TYPE:  INP   LOCATION:  5527                         FACILITY:  [facility]   PHYSICIAN:  Quita Skye. Hart Rochester, M.D.  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   SECONDARY DIAGNOSES:  1. End stage renal disease on dialysis Tuesday, Thursday, Saturday.  2. Hypertension.  3. History of anemia.  4. Secondary hyperparathyroidism.  5. History of methicillin resistant Staphylococcus aureus bacteremia.  6. Seizure disorder.  7. Asthma.  8. Status post multiple dialysis access.  9. Status post bilateral nephrectomy.  10.Status post cholecystectomy.   IN-HOSPITAL OPERATIONS AND PROCEDURES:  1. Evacuation right hematoma from pseudoaneurysm right superficial femoral      artery.  2. Repair of arteriovenous fistula between right superficial femoral      artery and vein with a suture repair of superficial femoral vein and an      interposition 6 mm Gore-Tex graft to replace segment of right      superficial femoral artery.   HISTORY AND PHYSICAL AND HOSPITAL COURSE:  The patient is a 55 year old  Caucasian female with a past medical history of hypertension and end stage  renal disease.  She was recently hospitalized April 15, 2006, for seizures.  At this time, Dr. Edilia Bo was consulted to place a Diatek catheter.  He  placed a right femoral Diatek catheter of April 19, 2006.  Prior to  discharge, the right femoral Diatek catheter was discontinued.  Following  discontinuation of the Diatek, a right groin hematoma was noted.  This was  followed.  The patient was discharged to home April 25, 2006.   Since  discharge home, the patient has complained of right groin increased pain and  swelling causing difficulty walking.  She presented to the emergency room  May 02, 2006.  The patient was then seen in the [facility] on May 03, 2006, by Dr. Hart Rochester where she underwent right groin ultrasound.  This showed  a right femoral pseudoaneurysm.  The patient was evaluated by Dr. Hart Rochester.  Dr. Hart Rochester discussed the patient undergoing evacuation of the hematoma.  He  discussed the risks and benefits with the patient.  The patient acknowledged  understanding and agreed to proceed.  Surgery was scheduled for May 03, 2006.  For details of the patient's past medical history and physical exam,  please see dictated history and physical.   The patient was admitted to [facility] on May 03, 2006.  Following admission, she was scheduled for urgent surgery on May 03, 2006.  She was taken to the operating room later that afternoon where she  underwent evacuation of a large hematoma and pseudoaneurysm of the right  superficial femoral artery with repair of arteriovenous fistula between the  right  superficial femoral artery and vein with a suture repair of the  superficial femoral vein and an interposition 6 mm Gore-Tex graft to replace  the segment of the right superficial femoral artery.  The patient tolerated  the procedure well and was transferred back to 5500 in stable condition.  Noted on admission, renal was consulted for management of the patient's  hemodialysis.  The patient's hospital course was uncomplicated.  She  tolerated her surgery well.  She did require a JP drain after surgery which  was monitored and noted decreased drainage and was able to be discontinued  on postop day four.  The patient was placed on vancomycin for prophylactic  coverage.  This was discontinued postop day four.  The incision remained  clean, dry and intact and healing well.  The patient was able to  be out of  bed and ambulate with a rolling walker.  Renal continued to follow the  patient for dialysis Tuesday, Thursday and Saturday.  The care manager was  consulted for home health care.  A home health nurse was arranged.  During  the patient's postoperative course, she did develop anemia.  She was already  on Niferex prior to hospitalization.  They recommend epo injections.  They  will continue to follow this as an outpatient.  The patient's vital signs  were monitored postoperatively and noted to be stable.  She is afebrile.  Oxygen saturation greater than 90% on room air.  The plan is discharge the  patient in the a.m. on postop day seven, May 10, 2006.   FOLLOW-UP APPOINTMENTS:  A follow-up appointment will be arranged with Dr.  Hart Rochester for in three weeks.  Our office will contact the patient with this  information.  The patient is to continue hemodialysis on Tuesdays, Thursdays  and Saturdays.   DISCHARGE INSTRUCTIONS:  The patient is instructed no driving until released  to do so, no lifting.  She is told to ambulate 3-4 times a day and progress  as tolerated.  The patient is told she is allowed to shower washing her  incisions using soap and water.  Contact the office if she develops any  drainage or opening from any of her incisions.  The patient's diet is to be  a low fat, low salt renal diet.   DISCHARGE MEDICATIONS:  1. Percocet 5/325, 1-2 tablets q.4-6h. p.r.n. pain.  2. Tenormin 25 mg p.o. q.h.s.  3. Epo injections at hemodialysis.  4. Advair 250/150, 1 puff b.i.d.  5. Amlodipine 5 mg q.h.s.  6. Tramadol 1 tab p.o. b.i.d. p.r.n.  7. Sertraline 50 mg daily.  8. Topamax 200 mg b.i.d.  9. PhosLo 667 mg t.i.d. with meals.  10.Multi-vitamin daily.      Theda Belfast, PA    ______________________________  Quita Skye Hart Rochester, M.D.    KMD/MEDQ  D:  05/09/2006  T:  05/10/2006  Job:  [id number]

## 2010-12-01 NOTE — Op Note (Signed)
NAMEANTANASIA, KACZYNSKI NO.:  [id number]   MEDICAL RECORD NO.:  [id number]          PATIENT TYPE:  INP   LOCATION:  6704                         FACILITY:  [facility]   PHYSICIAN:  Di Kindle. Edilia Bo, M.D.DATE OF BIRTH:  09/14/1955   DATE OF PROCEDURE:  04/19/2006  DATE OF DISCHARGE:                                 OPERATIVE REPORT   PREOPERATIVE DIAGNOSIS:  Chronic renal failure.   POSTOPERATIVE DIAGNOSIS:  Chronic renal failure.   PROCEDURE:  1. Attempted placement of bilateral internal jugular catheters.  2. Attempted placement of bilateral subclavian vein catheters.  3. Right femoral 55-cm Diatek.   SURGEON:  Di Kindle. Edilia Bo, M.D.   ANESTHESIA:  Local with sedation.   TECHNIQUE:  The patient was taken to the operating room, sedated by  anesthesia.  Of note, she was morbidly obese.  I assessed both IJs with the  ultrasound.  The right IJ appeared to be patent although it took a somewhat  tortuous course.  The only vein at this in the left neck was quite small.  It was not clear whether or not this was actually the IJ.  The neck and  upper chest were then prepped and draped in the usual sterile fashion.  After the skin was infiltrated 1% lidocaine, I was able to cannulate the  right IJ but was unable to pass the wire distally into the right atrium.  I  did shoot a venogram and this may have simply been a large collateral in the  right neck.  I tried using the angled Glidewire but ultimately was never  able to get the catheter down into the superior vena cava.  Therefore I  attempted left IJ.  Again, the ultrasound showed only a very small vein here  which was probably not the IJ and I was unable to cannulate this and rather  than persist, I attempted subclavian vein catheters on both sides after the  skin was anesthetized.  I was unable to cannulate the vein on either side.  Given her morbid obesity, it was very difficult in determinate any  landmarks  and rather than persist and risk pneumothorax or arterial injury, I elected  simply to place a right femoral catheter.   The patient was reprepped and draped for placement of a right femoral  catheter.  The right femoral vein was cannulated after the skin was  anesthetized.  I had a difficult time passing the wire up the superior vena  cava and shot a venogram with the catheter pullback which showed that it was  patent.  I therefore was able to get an angled Glidewire down without any  difficulty and then dilated this tract and then the dilator and peel-away  sheath were passed over this wire and then the catheter was passed through  the peel-away sheath and positioned as far as it would go.  It did not reach  the right atrium.  The exit site for the catheter was selected and the skin  anesthetized between the two areas.  The catheter was brought through the  tunnel, cut to the appropriate length  and the distal ports were attached.  Both ports withdrew easily.  We then flushed with heparinized saline and  filled with  concentrated heparin.  The exit of the catheter was secured at its exit site  with a 3-0 nylon suture.  The femoral site was closed with a 3-0 nylon  suture.  Sterile dressing was applied.  The patient tolerated the procedure  well and was transferred to recovery room in satisfactory condition.  All  needle and sponge counts were correct.      Di Kindle. Edilia Bo, M.D.  Electronically Signed     CSD/MEDQ  D:  04/19/2006  T:  04/21/2006  Job:  [id number]

## 2010-12-01 NOTE — H&P (Signed)
[name], [name] NO.:  [id number]   MEDICAL RECORD NO.:  [id number]          PATIENT TYPE:  INP   LOCATION:  1828                         FACILITY:  [facility]   PHYSICIAN:  [name], M.D.DATE OF BIRTH:  1955/09/14   DATE OF ADMISSION:  11/08/2005  DATE OF DISCHARGE:                                HISTORY & PHYSICAL   CHIEF COMPLAINT:  Abdominal pain and vomiting.   HISTORY OF PRESENT ILLNESS:  This is a 55 year old female with history of  tuberous sclerosis who developed sudden onset of left lower quadrant  abdominal pain and vomiting this morning. No prior abdominal pain. No  diarrhea. No fever. No sick contact. 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 She is seen at [facility] and was seen on March 2007 by her  nephrologist, Dr. Sydnee [name]. Her last creatinine at that time was 3.5. CT  scan today shows a large angiomyolipoma with acute bleed.   PAST MEDICAL HISTORY:  1.  tuberous sclerosis followed at [facility].  2.  Depression.  3.  Seizure disorder, last seizure February 2007.  4.  Asthma.   MEDICATIONS:  1.  Advair 500/50 one puff b.i.d.  2.  Calcium 600 b.i.d.  3.  Iron in the form of Niferex 150 p.o. b.i.d.  4.  Nexium 40 mg p.o. daily.  5.  Zoloft 50 mg p.o. daily.  6.  Topamax 200 b.i.d.  7.  Allegra 60 mg b.i.d.  8.  Questionable Primidone, unknown dose.   FAMILY HISTORY:  Father with diabetes. Denies cancer, CAD, hypertension.   SOCIAL HISTORY:  The patient lives alone in [place]. She is not currently  working. She denies tobacco, ETOH or illicits.   PHYSICAL EXAMINATION:  VITAL SIGNS:  98.2, 70, 157/78, 95% on room air.  GENERAL:  She is an obese Caucasian female in no acute distress, alert and  oriented x3.  HEENT:  Extraocular muscles intact. Pupils are equal, round, and reactive to  light. Hypopigmented macular papular lesions on the face, suspected adenoma  sebaceum consistent with tuberous sclerosis.  CARDIOVASCULAR:  Regular rate and rhythm. No murmurs, rubs, or gallops.  RESPIRATORY:  Clear to auscultation bilaterally.  ABDOMEN:  Obese, soft, nontender, nondistended. No guarding. No rebound.  Abdominal scar appreciated. Heme negative. Positive bowel sounds. Large  palpable left abdominal mass.  NEUROLOGICAL:  Cranial nerves II-XII intact. No focal deficits.   STUDIES:  Urinalysis:  Positive for trace hemoglobin, 100 protein, moderate  leuk. White blood cell count 12.3, hemoglobin 10.8. Creatinine 3.1. Heme  negative. GFR 15. Abdominal CT showed large extensive left renal  hamartomatous malformation with interval growth and acute hemorrhage. The  margin measures 23 x 19 cm.   ASSESSMENT/PLAN:  1.  Abdominal pain, likely secondary to the left renal tumor. Patient states      left kidney has had tumors since 1995. CT in 2003 shows similar large      tumor. The patient without surgical signs, afebrile, positive bowel      sounds, moving bowels, heme negative. History of abdominal surgery with      numerous  tumors removed. Per radiology, acute bleed of the      angiomyolipoma would cause the acute abdominal pain.  2.  Right kidney disease status post right nephrectomy for removal of      tumors. Creatinine is 3.1 today, baseline of 3.5. Will retrieve records      from her [facility] nephrologist. Renal team has been consulted. GFR 15. Stage      IV chronic kidney disease.  3.  Left renal angiomyolipoma. Patient with a history of these tumors      associated with tuberous sclerosis. Consulted urology who said no      surgical treatment, bedrest and serial H&H.  4.  Tuberous sclerosis. Obtain records from  [facility].  5.  Asthma. Continue Advair 500/50.  6.  Gastroesophageal reflux disease. Protonix.  7.  Hypertension. Clonidine and pain control.  8.  Seizure. Topamax and will address Coumadin home dose.      [name], M.D.    ______________________________  [name] Crook. Erenest [name], M.D.    MBV/MEDQ  D:  11/08/2005  T:  11/08/2005  Job:  [id number]

## 2010-12-26 ENCOUNTER — Other Ambulatory Visit: Payer: Self-pay | Admitting: Internal Medicine

## 2010-12-26 DIAGNOSIS — G4730 Sleep apnea, unspecified: Secondary | ICD-10-CM

## 2010-12-27 ENCOUNTER — Ambulatory Visit (HOSPITAL_BASED_OUTPATIENT_CLINIC_OR_DEPARTMENT_OTHER): Payer: Medicare Other | Attending: Internal Medicine

## 2010-12-27 DIAGNOSIS — G4733 Obstructive sleep apnea (adult) (pediatric): Secondary | ICD-10-CM | POA: Insufficient documentation

## 2010-12-27 DIAGNOSIS — G4730 Sleep apnea, unspecified: Secondary | ICD-10-CM

## 2010-12-27 DIAGNOSIS — G4737 Central sleep apnea in conditions classified elsewhere: Secondary | ICD-10-CM | POA: Insufficient documentation

## 2011-01-02 DIAGNOSIS — G4737 Central sleep apnea in conditions classified elsewhere: Secondary | ICD-10-CM

## 2011-01-02 DIAGNOSIS — G4733 Obstructive sleep apnea (adult) (pediatric): Secondary | ICD-10-CM

## 2011-01-02 NOTE — Procedures (Signed)
NAMETARISSA, KERIN NO.:  [id number]  MEDICAL RECORD NO.:  [id number]          PATIENT TYPE:  OUT  LOCATION:  SLEEP CENTER                 FACILITY:  [facility]  PHYSICIAN:  Barbaraann Share, MD,FCCPDATE OF BIRTH:  03-21-1956  DATE OF STUDY:  12/27/2010                           NOCTURNAL POLYSOMNOGRAM  REFERRING PHYSICIAN:  Stacie Glaze, MD  INDICATION FOR STUDY:  Hypersomnia with sleep apnea.  EPWORTH SLEEPINESS SCORE:  16.  MEDICATIONS:  SLEEP ARCHITECTURE:  The patient had a total sleep time of 410 minutes with no slow wave sleep and significantly decreased REM.  Sleep onset latency was normal at 8 minutes, and REM onset was significantly delayed.  Sleep efficiency was excellent at 93% during the diagnostic portion of the study, and 98% during the titration portion.  RESPIRATORY DATA:  The patient underwent a split night study where she was found to have 62 obstructive and central events in the first 120 minutes of sleep.  This gave her an apnea/hypopnea index of 31 events per hour.  The events occurred in all body positions and there was loud snoring noted throughout.  By protocol, the patient was then fitted with a medium ResMed Quattro Fx full-face mask, and found to have an optimal CPAP pressure of 15 cm of water.  She was initially titrated as high as 17 cm, however this appeared to result in increased central events.  OXYGEN DATA:  There was O2 desaturation as low as 81% with the patient's obstructive and central events.  CARDIAC DATA:  No clinically significant arrhythmias were noted.  MOVEMENT-PARASOMNIA:  The patient had no significant leg jerks or other abnormal behavior seen.  IMPRESSIONS-RECOMMENDATIONS:  Split night study reveals moderate obstructive and central sleep apnea with an AHI of 31 events per hour and O2 desaturation as low as 81%.  The patient was then fitted with a medium ResMed Quattro Fx full-face mask, and found to have an  optimal CPAP pressure of 15 cm of water.  I would recommend initiating her at 10 cm of water pressure and gradually increasing to allow for desensitization.  The patient should also be encouraged to work aggressively on weight loss.     Barbaraann Share, MD,FCCP Diplomate, American Board of Sleep Medicine Electronically Signed    KMC/MEDQ  D:  01/02/2011 08:12:57  T:  01/02/2011 21:53:46  Job:  [id number]

## 2011-02-07 ENCOUNTER — Telehealth: Payer: Self-pay

## 2011-02-07 NOTE — Telephone Encounter (Signed)
Waiting on a returned fax.  Per Rushie Goltz, rx has been sent to front for faxing

## 2011-02-09 ENCOUNTER — Telehealth: Payer: Self-pay | Admitting: Internal Medicine

## 2011-02-09 NOTE — Telephone Encounter (Signed)
Attempted to call back but we only have the fax number.

## 2011-02-09 NOTE — Telephone Encounter (Signed)
[facility] called to check on status of order for pts cpap mask. Pls fax to [phone] Alda Lea.

## 2011-02-09 NOTE — Telephone Encounter (Signed)
It was faxed several weeks ago- i f you have not received , you will need to send another one for [place] to c pmplete

## 2011-02-27 ENCOUNTER — Encounter: Payer: Self-pay | Admitting: Internal Medicine

## 2011-02-27 ENCOUNTER — Ambulatory Visit (INDEPENDENT_AMBULATORY_CARE_PROVIDER_SITE_OTHER): Payer: Medicare Other | Admitting: Internal Medicine

## 2011-02-27 VITALS — BP 144/80 | HR 80 | Temp 98.5°F | Resp 20 | Ht 67.0 in | Wt 302.0 lb

## 2011-02-27 DIAGNOSIS — F332 Major depressive disorder, recurrent severe without psychotic features: Secondary | ICD-10-CM

## 2011-02-27 DIAGNOSIS — G40209 Localization-related (focal) (partial) symptomatic epilepsy and epileptic syndromes with complex partial seizures, not intractable, without status epilepticus: Secondary | ICD-10-CM

## 2011-02-27 DIAGNOSIS — M159 Polyosteoarthritis, unspecified: Secondary | ICD-10-CM

## 2011-02-27 DIAGNOSIS — J45909 Unspecified asthma, uncomplicated: Secondary | ICD-10-CM

## 2011-02-27 DIAGNOSIS — N186 End stage renal disease: Secondary | ICD-10-CM

## 2011-02-27 NOTE — Progress Notes (Signed)
Subjective:    Patient ID: [name], female    DOB: 07/04/1956, 55 y.o.   MRN: [id number]  HPI Patient is a 55 year old white female with tuberous sclerosis, end-stage renal disease a history of seizure disorder and hypertension who presents today for routine followup and review of her medications.  She sees [place] for her chronic allergic rhinitis for which he is on Nasonex for monitoring of her blood pressure and other medications including her antidepressant and antianxiety old medications.  She is currently stable on the combination of Klonopin and Cymbalta. 4 asthmatic bronchitis she has been on Advair Diskus she states that she has not had to use her rescue inhaler since her last visit   Review of Systems  Constitutional: Negative for activity change, appetite change and fatigue.  HENT: Negative for ear pain, congestion, neck pain, postnasal drip and sinus pressure.   Eyes: Negative for redness and visual disturbance.  Respiratory: Negative for cough, shortness of breath and wheezing.   Gastrointestinal: Negative for abdominal pain and abdominal distention.  Genitourinary: Negative for dysuria, frequency and menstrual problem.  Musculoskeletal: Negative for myalgias, joint swelling and arthralgias.  Skin: Negative for rash and wound.  Neurological: Negative for dizziness, weakness and headaches.  Hematological: Negative for adenopathy. Does not bruise/bleed easily.  Psychiatric/Behavioral: Negative for sleep disturbance and decreased concentration.   Past Medical History  Diagnosis Date  . Diabetes mellitus   . Renal insufficiency   . Mental retardation   . Osteoarthritis   . Sleep apnea   . End-stage renal disease on hemodialysis   . Tuberous sclerosis   . Obesity   . Seizures   . GERD (gastroesophageal reflux disease)   . Depression   . Mental retardation   . Sleep apnea     on c-pap  . Hx MRSA infection    Past Surgical History  Procedure Date  . Shunt in  arm for dialysis   . Uterine tumor   . Nephrectomy     reports that she has never smoked. She does not have any smokeless tobacco history on file. She reports that she does not drink alcohol or use illicit drugs. family history includes Cancer in her mother and Diabetes in her mother. Allergies  Allergen Reactions  . Methylprednisolone   . Omnipaque (Iohexol)        Objective:   Physical Exam  Vitals reviewed. Constitutional: She is oriented to person, place, and time. She appears well-developed and well-nourished. No distress.  HENT:  Head: Normocephalic and atraumatic.  Right Ear: External ear normal.  Left Ear: External ear normal.  Nose: Nose normal.  Mouth/Throat: Oropharynx is clear and moist.  Eyes: Conjunctivae and EOM are normal. Pupils are equal, round, and reactive to light.  Neck: Normal range of motion. Neck supple. No JVD present. No tracheal deviation present. No thyromegaly present.  Cardiovascular: Normal rate, regular rhythm, normal heart sounds and intact distal pulses.   No murmur heard. Pulmonary/Chest: Effort normal and breath sounds normal. She has no wheezes. She exhibits no tenderness.  Abdominal: Soft. Bowel sounds are normal.  Musculoskeletal: Normal range of motion. She exhibits no edema and no tenderness.  Lymphadenopathy:    She has no cervical adenopathy.  Neurological: She is alert and oriented to person, place, and time. She has normal reflexes. No cranial nerve deficit.  Skin: Skin is warm and dry. She is not diaphoretic.       Multiple skin tumors from the tuberous sclerosis  Psychiatric: She  has a normal mood and affect. Her behavior is normal.          Assessment & Plan:  No significant change in her plan continuing the Klonopin and the Cymbalta as previously diagnosed and prescribed.  Samples of Advair were given to aid in compliance discussion of exercise and weight loss ensued... she states that she has been losing weight over the  scale does not represent significant weight loss that she perceives we discussed Max Sweet's and the use of sugar control is his primary areas that she could cut back on to lose weight she denies any current seizure activity has had no chest pain abnormal PND orthopnea is stable on her medications.

## 2011-02-27 NOTE — Assessment & Plan Note (Signed)
Renal monitoring at dialysis Blood count monitoring as well

## 2011-03-09 ENCOUNTER — Telehealth: Payer: Self-pay | Admitting: Internal Medicine

## 2011-03-09 NOTE — Telephone Encounter (Signed)
Pt called and said that she is moving out of [facility] and is needing Dr Lovell Sheehan to write a note stating that pt is able to take care of herself and affairs,like medications, etc. Pt will have a live in CNA with her at home.  Pls fax letter to [facility] fax# [phone].

## 2011-03-12 NOTE — Telephone Encounter (Signed)
Notified [facility].

## 2011-03-12 NOTE — Telephone Encounter (Signed)
Dr Lovell Sheehan wants [name] to stay where she is for now.  He doesn't want her living alone for now

## 2011-03-27 ENCOUNTER — Telehealth: Payer: Self-pay | Admitting: Internal Medicine

## 2011-03-27 NOTE — Telephone Encounter (Signed)
Pt called and is asking assistance from Dr Lovell Sheehan, to get in [facility] [place], because it is closer to pts dialysis and church,also is on the [place] line.

## 2011-04-05 LAB — CBC
HCT: 26.9 — ABNORMAL LOW
HCT: 29.3 — ABNORMAL LOW
HCT: 30.2 — ABNORMAL LOW
HCT: 30.4 — ABNORMAL LOW
Hemoglobin: 10 — ABNORMAL LOW
Hemoglobin: 10.1 — ABNORMAL LOW
Hemoglobin: 9.1 — ABNORMAL LOW
Hemoglobin: 9.6 — ABNORMAL LOW
MCHC: 32.4
MCHC: 33.1
MCHC: 33.7
MCV: 108 — ABNORMAL HIGH
MCV: 109.8 — ABNORMAL HIGH
Platelets: 240
RBC: 2.49 — ABNORMAL LOW
RBC: 2.68 — ABNORMAL LOW
RBC: 2.76 — ABNORMAL LOW
RDW: 16.5 — ABNORMAL HIGH
RDW: 16.8 — ABNORMAL HIGH
RDW: 17.2 — ABNORMAL HIGH
RDW: 17.3 — ABNORMAL HIGH
WBC: 5.2
WBC: 6.5

## 2011-04-05 LAB — RENAL FUNCTION PANEL
Albumin: 2.9 — ABNORMAL LOW
BUN: 37 — ABNORMAL HIGH
BUN: 51 — ABNORMAL HIGH
CO2: 22
CO2: 24
Calcium: 9.2
Calcium: 9.2
Chloride: 98
Chloride: 99
Creatinine, Ser: 7.19 — ABNORMAL HIGH
GFR calc Af Amer: 6 — ABNORMAL LOW
GFR calc non Af Amer: 5 — ABNORMAL LOW
GFR calc non Af Amer: 6 — ABNORMAL LOW
Glucose, Bld: 84
Glucose, Bld: 85
Glucose, Bld: 89
Phosphorus: 7.5 — ABNORMAL HIGH
Potassium: 4.6
Potassium: 5.7 — ABNORMAL HIGH
Potassium: 5.7 — ABNORMAL HIGH
Sodium: 133 — ABNORMAL LOW
Sodium: 135

## 2011-04-05 LAB — POTASSIUM
Potassium: 4
Potassium: 6.9
Potassium: <2 — CL

## 2011-04-05 LAB — DIFFERENTIAL
Basophils Absolute: 0
Basophils Relative: 1
Eosinophils Absolute: 0.2
Eosinophils Relative: 4
Monocytes Absolute: 0.2
Monocytes Relative: 4

## 2011-04-05 LAB — I-STAT 8, (EC8 V) (CONVERTED LAB)
Bicarbonate: 20.8
Glucose, Bld: 80
Potassium: 5.1
TCO2: 22
pCO2, Ven: 34.3 — ABNORMAL LOW
pH, Ven: 7.392 — ABNORMAL HIGH

## 2011-04-06 LAB — CBC
Hemoglobin: 10.8 — ABNORMAL LOW
MCHC: 32.6
MCV: 109.2 — ABNORMAL HIGH
RBC: 3 — ABNORMAL LOW
RDW: 20 — ABNORMAL HIGH
WBC: 6.7

## 2011-04-06 LAB — I-STAT 8, (EC8 V) (CONVERTED LAB)
BUN: 24 — ABNORMAL HIGH
Bicarbonate: 23
HCT: 35 — ABNORMAL LOW
Hemoglobin: 11.9 — ABNORMAL LOW
Operator id: 282201
Potassium: 4.1
Sodium: 134 — ABNORMAL LOW
TCO2: 24

## 2011-04-06 LAB — COMPREHENSIVE METABOLIC PANEL
ALT: 10
AST: 24
Albumin: 3.2 — ABNORMAL LOW
Calcium: 8.4
GFR calc Af Amer: 5 — ABNORMAL LOW
Sodium: 137
Total Protein: 6

## 2011-04-06 LAB — DIFFERENTIAL
Eosinophils Absolute: 0.1
Eosinophils Relative: 3
Lymphs Abs: 1.3
Monocytes Relative: 3

## 2011-04-06 LAB — PROTIME-INR: Prothrombin Time: 12.7

## 2011-04-09 LAB — DIFFERENTIAL
Basophils Absolute: 0
Basophils Relative: 0
Eosinophils Absolute: 0.1
Eosinophils Absolute: 0.1
Eosinophils Absolute: 0.2
Eosinophils Relative: 3
Eosinophils Relative: 4
Lymphocytes Relative: 32
Lymphocytes Relative: 16
Lymphocytes Relative: 24
Lymphs Abs: 0.7
Monocytes Absolute: 0.2
Monocytes Relative: 6
Neutro Abs: 1.9
Neutro Abs: 2.7
Neutrophils Relative %: 58
Neutrophils Relative %: 74

## 2011-04-09 LAB — CBC
HCT: 24.4 — ABNORMAL LOW
HCT: 24.5 — ABNORMAL LOW
HCT: 25 — ABNORMAL LOW
HCT: 27.3 — ABNORMAL LOW
HCT: 26.9 — ABNORMAL LOW
HCT: 29.2 — ABNORMAL LOW
HCT: 31.5 — ABNORMAL LOW
Hemoglobin: 7.9 — CL
Hemoglobin: 8 — ABNORMAL LOW
Hemoglobin: 8.1 — ABNORMAL LOW
Hemoglobin: 10.6 — ABNORMAL LOW
Hemoglobin: 9.1 — ABNORMAL LOW
MCHC: 33
MCHC: 33
MCHC: 33.4
MCHC: 33.8
MCHC: 33.5
MCHC: 33.8
MCHC: 33.9
MCV: 103 — ABNORMAL HIGH
MCV: 109.3 — ABNORMAL HIGH
MCV: 109.8 — ABNORMAL HIGH
MCV: 104.1 — ABNORMAL HIGH
MCV: 105.8 — ABNORMAL HIGH
Platelets: 139 — ABNORMAL LOW
Platelets: 145 — ABNORMAL LOW
Platelets: 146 — ABNORMAL LOW
Platelets: 175
RBC: 2.18 — ABNORMAL LOW
RBC: 2.27 — ABNORMAL LOW
RBC: 2.76 — ABNORMAL LOW
RDW: 19.1 — ABNORMAL HIGH
RDW: 19.8 — ABNORMAL HIGH
RDW: 22 — ABNORMAL HIGH
RDW: 22.1 — ABNORMAL HIGH
RDW: 22.4 — ABNORMAL HIGH
RDW: 20.1 — ABNORMAL HIGH
RDW: 21 — ABNORMAL HIGH
RDW: 21.5 — ABNORMAL HIGH
WBC: 3.3 — ABNORMAL LOW
WBC: 4.1

## 2011-04-09 LAB — CK TOTAL AND CKMB (NOT AT ARMC)
CK, MB: 0.9
CK, MB: 1.3
Relative Index: INVALID
Total CK: 17

## 2011-04-09 LAB — CROSSMATCH
ABO/RH(D): A POS
Antibody Screen: POSITIVE
DAT, IgG: NEGATIVE
Donor AG Type: NEGATIVE

## 2011-04-09 LAB — COMPREHENSIVE METABOLIC PANEL
ALT: 9
ALT: 15
AST: 12
AST: 11
Albumin: 2.9 — ABNORMAL LOW
Albumin: 3.2 — ABNORMAL LOW
Alkaline Phosphatase: 56
BUN: 42 — ABNORMAL HIGH
BUN: 83 — ABNORMAL HIGH
Calcium: 8.9
Calcium: 9.1
Chloride: 103
Creatinine, Ser: 14.27 — ABNORMAL HIGH
GFR calc Af Amer: 5 — ABNORMAL LOW
GFR calc Af Amer: 3 — ABNORMAL LOW
GFR calc non Af Amer: 3 — ABNORMAL LOW
Glucose, Bld: 85
Potassium: 5.9 — ABNORMAL HIGH
Sodium: 135
Sodium: 138
Sodium: 134 — ABNORMAL LOW
Total Bilirubin: 0.7
Total Protein: 5.6 — ABNORMAL LOW
Total Protein: 6.2
Total Protein: 6

## 2011-04-09 LAB — I-STAT 8, (EC8 V) (CONVERTED LAB)
Bicarbonate: 20.4
Chloride: 109
Glucose, Bld: 77
Glucose, Bld: 83
Hemoglobin: 10.2 — ABNORMAL LOW
Operator id: 294341
Potassium: 5
Sodium: 133 — ABNORMAL LOW
TCO2: 22
pH, Ven: 7.327 — ABNORMAL HIGH

## 2011-04-09 LAB — BASIC METABOLIC PANEL
BUN: 46 — ABNORMAL HIGH
Chloride: 99
GFR calc non Af Amer: 6 — ABNORMAL LOW
Glucose, Bld: 69 — ABNORMAL LOW
Potassium: 5.6 — ABNORMAL HIGH

## 2011-04-09 LAB — TROPONIN I
Troponin I: 0.02
Troponin I: 0.03

## 2011-04-09 LAB — POCT I-STAT, CHEM 8
BUN: 110 — ABNORMAL HIGH
BUN: 45 — ABNORMAL HIGH
BUN: 60 — ABNORMAL HIGH
BUN: 62 — ABNORMAL HIGH
Calcium, Ion: 1.03 — ABNORMAL LOW
Calcium, Ion: 1.07 — ABNORMAL LOW
Calcium, Ion: 1.14
Calcium, Ion: 1.22
Chloride: 108
Chloride: 108
Chloride: 110
Creatinine, Ser: 10 — ABNORMAL HIGH
Creatinine, Ser: 11.6 — ABNORMAL HIGH
Creatinine, Ser: 13.9 — ABNORMAL HIGH
Creatinine, Ser: 8.8 — ABNORMAL HIGH
Glucose, Bld: 82
Glucose, Bld: 83
Glucose, Bld: 83
Glucose, Bld: 86
Glucose, Bld: 94
HCT: 30 — ABNORMAL LOW
HCT: 32 — ABNORMAL LOW
HCT: 34 — ABNORMAL LOW
Hemoglobin: 10.2 — ABNORMAL LOW
Hemoglobin: 10.9 — ABNORMAL LOW
Hemoglobin: 11.2 — ABNORMAL LOW
Potassium: 4.3
Potassium: 6.5
Sodium: 129 — ABNORMAL LOW
Sodium: 137
TCO2: 12
TCO2: 21
TCO2: 22

## 2011-04-09 LAB — RENAL FUNCTION PANEL
BUN: 69 — ABNORMAL HIGH
CO2: 22
CO2: 28
Calcium: 8.8
Calcium: 9.2
Calcium: 9.2
Calcium: 8.8
Chloride: 100
Chloride: 97
Creatinine, Ser: 6.78 — ABNORMAL HIGH
Creatinine, Ser: 7.28 — ABNORMAL HIGH
GFR calc Af Amer: 7 — ABNORMAL LOW
GFR calc Af Amer: 7 — ABNORMAL LOW
GFR calc Af Amer: 8 — ABNORMAL LOW
GFR calc non Af Amer: 6 — ABNORMAL LOW
GFR calc non Af Amer: 6 — ABNORMAL LOW
Glucose, Bld: 73
Glucose, Bld: 80
Glucose, Bld: 89
Glucose, Bld: 73
Phosphorus: 5.5 — ABNORMAL HIGH
Phosphorus: 7 — ABNORMAL HIGH
Phosphorus: 8.3 — ABNORMAL HIGH
Phosphorus: 4.9 — ABNORMAL HIGH
Potassium: 4.9
Sodium: 132 — ABNORMAL LOW
Sodium: 132 — ABNORMAL LOW
Sodium: 133 — ABNORMAL LOW
Sodium: 130 — ABNORMAL LOW

## 2011-04-09 LAB — POCT CARDIAC MARKERS
Operator id: 294341
Troponin i, poc: <0.05

## 2011-04-09 LAB — CULTURE, BLOOD (ROUTINE X 2): Culture: NO GROWTH

## 2011-04-09 LAB — INFLUENZA A+B VIRUS AG-DIRECT(RAPID): Inflenza A Ag: NEGATIVE

## 2011-04-09 LAB — POCT I-STAT CREATININE: Operator id: 294341

## 2011-04-09 LAB — OCCULT BLOOD X 1 CARD TO LAB, STOOL: Fecal Occult Bld: NEGATIVE

## 2011-04-09 LAB — PRIMIDONE AND METABOLITE LEVEL
Primidone, Serum: 6 — ABNORMAL LOW (ref 5–12)
Primidone, Serum: 12 (ref 5–12)

## 2011-04-09 LAB — PHOSPHORUS: Phosphorus: 7.2 — ABNORMAL HIGH

## 2011-04-10 LAB — DIFFERENTIAL
Basophils Absolute: 0
Lymphocytes Relative: 17
Lymphs Abs: 1.2
Neutro Abs: 5.5

## 2011-04-10 LAB — BASIC METABOLIC PANEL
BUN: 41 — ABNORMAL HIGH
Calcium: 9.5
GFR calc non Af Amer: 6 — ABNORMAL LOW
Glucose, Bld: 98
Sodium: 134 — ABNORMAL LOW

## 2011-04-10 LAB — COMPREHENSIVE METABOLIC PANEL
ALT: 18
Alkaline Phosphatase: 86
BUN: 41 — ABNORMAL HIGH
Chloride: 97
Glucose, Bld: 89
Potassium: 4.3
Sodium: 137
Total Bilirubin: 0.5
Total Protein: 6.9

## 2011-04-10 LAB — POCT I-STAT, CHEM 8
Chloride: 112
Creatinine, Ser: 7 — ABNORMAL HIGH
Glucose, Bld: 90
Glucose, Bld: 83
HCT: 30 — ABNORMAL LOW
HCT: 35 — ABNORMAL LOW
Hemoglobin: 10.2 — ABNORMAL LOW
Hemoglobin: 11.9 — ABNORMAL LOW
Potassium: 5.1
Potassium: 4.4
Sodium: 135
Sodium: 136
TCO2: 26

## 2011-04-10 LAB — CBC
MCHC: 32.9
Platelets: 166
RBC: 3.13 — ABNORMAL LOW
RDW: 20.8 — ABNORMAL HIGH
WBC: 7.3

## 2011-04-10 LAB — PRIMIDONE AND METABOLITE LEVEL
Phenobarbital: 7.5 — ABNORMAL LOW
Primidone, Serum: 3 — ABNORMAL LOW (ref 5–12)

## 2011-04-10 LAB — B-NATRIURETIC PEPTIDE (CONVERTED LAB): Pro B Natriuretic peptide (BNP): 31

## 2011-04-10 LAB — RENAL FUNCTION PANEL
CO2: 13 — ABNORMAL LOW
Calcium: 9.3
Chloride: 110
GFR calc Af Amer: 7 — ABNORMAL LOW
GFR calc non Af Amer: 6 — ABNORMAL LOW
Glucose, Bld: 89
Potassium: 4.7
Sodium: 136

## 2011-04-11 LAB — BASIC METABOLIC PANEL
BUN: 150 — ABNORMAL HIGH
Calcium: 10.1
Calcium: 12.5 — ABNORMAL HIGH
GFR calc Af Amer: 5 — ABNORMAL LOW
GFR calc non Af Amer: 3 — ABNORMAL LOW
GFR calc non Af Amer: 4 — ABNORMAL LOW
Glucose, Bld: 112 — ABNORMAL HIGH
Glucose, Bld: 117 — ABNORMAL HIGH
Potassium: 4.6
Potassium: >7.5
Sodium: 135
Sodium: 138

## 2011-04-11 LAB — DIFFERENTIAL
Basophils Absolute: 0
Eosinophils Relative: 0
Lymphocytes Relative: 9 — ABNORMAL LOW
Lymphs Abs: 0.8
Monocytes Absolute: 0.1
Monocytes Relative: 2 — ABNORMAL LOW

## 2011-04-11 LAB — POCT I-STAT, CHEM 8
Calcium, Ion: 1.23
Chloride: 111
HCT: 36
Hemoglobin: 12.2
Potassium: 8.7

## 2011-04-11 LAB — CBC
HCT: 31.5 — ABNORMAL LOW
HCT: 32.4 — ABNORMAL LOW
HCT: 34.5 — ABNORMAL LOW
Hemoglobin: 10.8 — ABNORMAL LOW
Hemoglobin: 11.4 — ABNORMAL LOW
Platelets: 181
Platelets: 212
RBC: 3.03 — ABNORMAL LOW
RBC: 3.31 — ABNORMAL LOW
RDW: 19.4 — ABNORMAL HIGH
RDW: 19.6 — ABNORMAL HIGH
WBC: 6.3
WBC: 8.3
WBC: 8.9

## 2011-04-12 LAB — CBC
HCT: 24.8 — ABNORMAL LOW
HCT: 26.7 — ABNORMAL LOW
HCT: 30 — ABNORMAL LOW
Hemoglobin: 10 — ABNORMAL LOW
Hemoglobin: 8.2 — ABNORMAL LOW
Hemoglobin: 9.1 — ABNORMAL LOW
MCHC: 33
MCHC: 33.8
MCV: 101 — ABNORMAL HIGH
MCV: 104.4 — ABNORMAL HIGH
MCV: 104.6 — ABNORMAL HIGH
Platelets: 141 — ABNORMAL LOW
Platelets: 152
RBC: 2.21 — ABNORMAL LOW
RBC: 2.37 — ABNORMAL LOW
RBC: 2.54 — ABNORMAL LOW
RDW: 19.4 — ABNORMAL HIGH
RDW: 19.4 — ABNORMAL HIGH
WBC: 3.7 — ABNORMAL LOW
WBC: 4.4

## 2011-04-12 LAB — POCT I-STAT, CHEM 8
BUN: 73 — ABNORMAL HIGH
BUN: 101 — ABNORMAL HIGH
BUN: 53 — ABNORMAL HIGH
Calcium, Ion: 1.35 — ABNORMAL HIGH
Chloride: 108
Chloride: 105
Chloride: 106
Chloride: 107
Chloride: 105
Creatinine, Ser: 10 — ABNORMAL HIGH
Creatinine, Ser: 14.4 — ABNORMAL HIGH
Creatinine, Ser: 8.1 — ABNORMAL HIGH
Creatinine, Ser: 8.1 — ABNORMAL HIGH
Glucose, Bld: 98
Glucose, Bld: 82
Glucose, Bld: 83
Glucose, Bld: 82
HCT: 28 — ABNORMAL LOW
HCT: 26 — ABNORMAL LOW
HCT: 28 — ABNORMAL LOW
HCT: 29 — ABNORMAL LOW
HCT: 29 — ABNORMAL LOW
HCT: 28 — ABNORMAL LOW
Hemoglobin: 8.8 — ABNORMAL LOW
Hemoglobin: 8.8 — ABNORMAL LOW
Hemoglobin: 9.9 — ABNORMAL LOW
Hemoglobin: 9.9 — ABNORMAL LOW
Hemoglobin: 9.5 — ABNORMAL LOW
Potassium: 5.2 — ABNORMAL HIGH
Potassium: 4.2
Potassium: 5.3 — ABNORMAL HIGH
Potassium: 5.5 — ABNORMAL HIGH
Potassium: 6.8
Potassium: 5.5 — ABNORMAL HIGH
Sodium: 127 — ABNORMAL LOW
Sodium: 128 — ABNORMAL LOW
Sodium: 133 — ABNORMAL LOW
Sodium: 136
Sodium: 137
Sodium: 129 — ABNORMAL LOW
TCO2: 14
TCO2: 21

## 2011-04-12 LAB — CROSSMATCH
ABO/RH(D): A POS
DAT, IgG: NEGATIVE
Donor AG Type: NEGATIVE
Donor AG Type: NEGATIVE

## 2011-04-12 LAB — OCCULT BLOOD X 1 CARD TO LAB, STOOL: Fecal Occult Bld: NEGATIVE

## 2011-04-12 LAB — RENAL FUNCTION PANEL
Albumin: 2.9 — ABNORMAL LOW
BUN: 54 — ABNORMAL HIGH
BUN: 66 — ABNORMAL HIGH
CO2: 25
Calcium: 8.8
Calcium: 9.1
Calcium: 9.2
Chloride: 98
Creatinine, Ser: 5.52 — ABNORMAL HIGH
Creatinine, Ser: 6.83 — ABNORMAL HIGH
Creatinine, Ser: 8.51 — ABNORMAL HIGH
GFR calc Af Amer: 10 — ABNORMAL LOW
GFR calc Af Amer: 8 — ABNORMAL LOW
GFR calc non Af Amer: 6 — ABNORMAL LOW
GFR calc non Af Amer: 8 — ABNORMAL LOW
Glucose, Bld: 69 — ABNORMAL LOW
Phosphorus: 4.9 — ABNORMAL HIGH
Phosphorus: 5.3 — ABNORMAL HIGH
Phosphorus: 6.3 — ABNORMAL HIGH
Potassium: 4.4
Potassium: 5.6 — ABNORMAL HIGH
Sodium: 130 — ABNORMAL LOW
Sodium: 133 — ABNORMAL LOW

## 2011-04-12 LAB — COMPREHENSIVE METABOLIC PANEL
ALT: <8
Alkaline Phosphatase: 80
BUN: 57 — ABNORMAL HIGH
CO2: 20
GFR calc non Af Amer: 5 — ABNORMAL LOW
Glucose, Bld: 85
Potassium: 4.2
Sodium: 135
Total Bilirubin: 0.8

## 2011-04-12 LAB — DIFFERENTIAL
Basophils Absolute: 0
Basophils Relative: 1
Eosinophils Absolute: 0.3
Neutrophils Relative %: 61

## 2011-04-12 LAB — PROTIME-INR
INR: 1
Prothrombin Time: 12.9

## 2011-04-12 LAB — BASIC METABOLIC PANEL
BUN: 96 — ABNORMAL HIGH
CO2: 17 — ABNORMAL LOW
Chloride: 105
Creatinine, Ser: 10.74 — ABNORMAL HIGH
Glucose, Bld: 89
Potassium: 5.9 — ABNORMAL HIGH

## 2011-04-12 LAB — POTASSIUM
Potassium: 5.7 — ABNORMAL HIGH
Potassium: 7.1

## 2011-04-12 LAB — POCT CARDIAC MARKERS
CKMB, poc: <1 — ABNORMAL LOW
Myoglobin, poc: 141

## 2011-04-13 LAB — CROSSMATCH
ABO/RH(D): A POS
DAT, IgG: NEGATIVE
Donor AG Type: NEGATIVE

## 2011-04-13 LAB — DIFFERENTIAL
Basophils Absolute: 0
Basophils Relative: 0
Eosinophils Absolute: 0
Eosinophils Relative: 0
Lymphocytes Relative: 16
Lymphs Abs: 0.5 — ABNORMAL LOW
Monocytes Absolute: 0.3
Monocytes Absolute: 0.4
Neutro Abs: 2.5
Neutro Abs: 4.1

## 2011-04-13 LAB — POCT I-STAT, CHEM 8
Chloride: 110
HCT: 26 — ABNORMAL LOW
Hemoglobin: 8.8 — ABNORMAL LOW
Potassium: 7.9
Sodium: 129 — ABNORMAL LOW

## 2011-04-13 LAB — CBC
HCT: 23.1 — ABNORMAL LOW
HCT: 28 — ABNORMAL LOW
Hemoglobin: 7.6 — CL
Hemoglobin: 8.7 — ABNORMAL LOW
MCHC: 33.3
MCV: 101.2 — ABNORMAL HIGH
MCV: 105.1 — ABNORMAL HIGH
Platelets: 106 — ABNORMAL LOW
Platelets: 120 — ABNORMAL LOW
RDW: 20.9 — ABNORMAL HIGH
RDW: 21.3 — ABNORMAL HIGH
RDW: 22.5 — ABNORMAL HIGH
WBC: 3.4 — ABNORMAL LOW

## 2011-04-13 LAB — RENAL FUNCTION PANEL
Albumin: 3 — ABNORMAL LOW
BUN: 75 — ABNORMAL HIGH
CO2: 20
Calcium: 8.4
Calcium: 8.5
Creatinine, Ser: 8.39 — ABNORMAL HIGH
GFR calc Af Amer: 6 — ABNORMAL LOW
GFR calc non Af Amer: 5 — ABNORMAL LOW
Glucose, Bld: 79
Phosphorus: 6.9 — ABNORMAL HIGH
Phosphorus: 8.3 — ABNORMAL HIGH
Potassium: 4.7
Sodium: 130 — ABNORMAL LOW

## 2011-04-13 LAB — IRON AND TIBC
Iron: 69
Saturation Ratios: 49
TIBC: 141 — ABNORMAL LOW

## 2011-04-13 LAB — POTASSIUM
Potassium: 4.4
Potassium: 4.8

## 2011-04-16 LAB — CROSSMATCH
ABO/RH(D): A POS
Donor AG Type: NEGATIVE
Donor AG Type: NEGATIVE

## 2011-04-16 LAB — TYPE AND SCREEN
ABO/RH(D): A POS
DAT, IgG: NEGATIVE
Donor AG Type: NEGATIVE
Donor AG Type: NEGATIVE
Donor AG Type: NEGATIVE
Donor AG Type: NEGATIVE
Donor AG Type: NEGATIVE

## 2011-04-16 LAB — POCT I-STAT, CHEM 8
BUN: 11
Creatinine, Ser: 2.7 — ABNORMAL HIGH
Hemoglobin: 7.8 — CL
Potassium: 3.6
Sodium: 142
TCO2: 37

## 2011-04-16 LAB — POCT I-STAT 4, (NA,K, GLUC, HGB,HCT)
Glucose, Bld: 100 — ABNORMAL HIGH
HCT: 25 — ABNORMAL LOW
HCT: 25 — ABNORMAL LOW

## 2011-04-16 LAB — TISSUE CULTURE

## 2011-04-16 LAB — WOUND CULTURE: Culture: NO GROWTH

## 2011-04-16 LAB — PROTIME-INR
INR: 1.1
Prothrombin Time: 14.2

## 2011-04-16 LAB — PREPARE FRESH FROZEN PLASMA

## 2011-04-17 LAB — RENAL FUNCTION PANEL
Albumin: 2.3 — ABNORMAL LOW
Albumin: 2.5 — ABNORMAL LOW
Albumin: 2.6 — ABNORMAL LOW
BUN: 14
BUN: 22
BUN: 29 — ABNORMAL HIGH
CO2: 32
CO2: 32
Calcium: 8.4
Calcium: 8.9
Calcium: 9
Calcium: 9.3
Chloride: 97
Chloride: 98
Creatinine, Ser: 2.95 — ABNORMAL HIGH
Creatinine, Ser: 5.16 — ABNORMAL HIGH
Creatinine, Ser: 6.58 — ABNORMAL HIGH
GFR calc Af Amer: 11 — ABNORMAL LOW
GFR calc Af Amer: 20 — ABNORMAL LOW
GFR calc Af Amer: 8 — ABNORMAL LOW
GFR calc non Af Amer: 17 — ABNORMAL LOW
GFR calc non Af Amer: 7 — ABNORMAL LOW
GFR calc non Af Amer: 9 — ABNORMAL LOW
Glucose, Bld: 84
Glucose, Bld: 87
Phosphorus: 4.6
Phosphorus: 5.1 — ABNORMAL HIGH
Phosphorus: 5.3 — ABNORMAL HIGH
Potassium: 4.2
Sodium: 138

## 2011-04-17 LAB — GLUCOSE, CAPILLARY
Glucose-Capillary: 100 — ABNORMAL HIGH
Glucose-Capillary: 104 — ABNORMAL HIGH
Glucose-Capillary: 106 — ABNORMAL HIGH
Glucose-Capillary: 109 — ABNORMAL HIGH
Glucose-Capillary: 68 — ABNORMAL LOW
Glucose-Capillary: 73
Glucose-Capillary: 76
Glucose-Capillary: 77
Glucose-Capillary: 80
Glucose-Capillary: 81
Glucose-Capillary: 82
Glucose-Capillary: 82
Glucose-Capillary: 84
Glucose-Capillary: 86
Glucose-Capillary: 86
Glucose-Capillary: 87
Glucose-Capillary: 91
Glucose-Capillary: 91
Glucose-Capillary: 96
Glucose-Capillary: 99

## 2011-04-17 LAB — POCT I-STAT 3, ART BLOOD GAS (G3+)
Acid-Base Excess: 8 — ABNORMAL HIGH
Bicarbonate: 30.8 — ABNORMAL HIGH
Bicarbonate: 34.4 — ABNORMAL HIGH
O2 Saturation: 99
Patient temperature: 98.5
TCO2: 32
TCO2: 36
pCO2 arterial: 41.9
pCO2 arterial: 50.7 — ABNORMAL HIGH
pCO2 arterial: 59.2
pH, Arterial: 7.383
pH, Arterial: 7.456 — ABNORMAL HIGH
pH, Arterial: 7.47 — ABNORMAL HIGH
pO2, Arterial: 150 — ABNORMAL HIGH
pO2, Arterial: 69 — ABNORMAL LOW

## 2011-04-17 LAB — CBC
HCT: 25.1 — ABNORMAL LOW
Hemoglobin: 8.4 — ABNORMAL LOW
Hemoglobin: 8.7 — ABNORMAL LOW
Hemoglobin: 8.8 — ABNORMAL LOW
MCHC: 33.9
MCHC: 34
MCHC: 34.4
MCHC: 34.8
MCV: 97.5
MCV: 97.5
MCV: 98.4
MCV: 99.1
Platelets: 128 — ABNORMAL LOW
Platelets: 133 — ABNORMAL LOW
Platelets: 146 — ABNORMAL LOW
Platelets: 150
Platelets: 150
RBC: 2.53 — ABNORMAL LOW
RBC: 2.57 — ABNORMAL LOW
RBC: 2.59 — ABNORMAL LOW
RDW: 21.1 — ABNORMAL HIGH
WBC: 2.7 — ABNORMAL LOW
WBC: 3.2 — ABNORMAL LOW
WBC: 3.6 — ABNORMAL LOW

## 2011-04-17 LAB — PROTIME-INR
INR: 1.1
Prothrombin Time: 14.9

## 2011-04-17 LAB — VANCOMYCIN, RANDOM: Vancomycin Rm: 17.9

## 2011-04-17 LAB — DIFFERENTIAL
Basophils Relative: 0
Eosinophils Absolute: 0.1
Eosinophils Relative: 4
Monocytes Relative: 5
Neutrophils Relative %: 66

## 2011-04-17 LAB — BASIC METABOLIC PANEL
CO2: 29
Chloride: 106
Creatinine, Ser: 4.55 — ABNORMAL HIGH
GFR calc Af Amer: 12 — ABNORMAL LOW
GFR calc Af Amer: 8 — ABNORMAL LOW
GFR calc non Af Amer: 7 — ABNORMAL LOW
Potassium: 4.7
Sodium: 142

## 2011-04-17 LAB — APTT: aPTT: 36

## 2011-04-18 LAB — RENAL FUNCTION PANEL
Albumin: 2.8 — ABNORMAL LOW
Albumin: 2.8 — ABNORMAL LOW
Albumin: 2.8 — ABNORMAL LOW
BUN: 24 — ABNORMAL HIGH
BUN: 47 — ABNORMAL HIGH
BUN: 53 — ABNORMAL HIGH
CO2: 22
CO2: 28
Calcium: 9.4
Calcium: 9.4
Calcium: 9.6
Chloride: 93 — ABNORMAL LOW
Chloride: 94 — ABNORMAL LOW
Chloride: 95 — ABNORMAL LOW
Creatinine, Ser: 4.91 — ABNORMAL HIGH
Creatinine, Ser: 6.81 — ABNORMAL HIGH
Creatinine, Ser: 8.53 — ABNORMAL HIGH
GFR calc Af Amer: 6 — ABNORMAL LOW
GFR calc Af Amer: 7 — ABNORMAL LOW
GFR calc Af Amer: 8 — ABNORMAL LOW
GFR calc non Af Amer: 5 — ABNORMAL LOW
GFR calc non Af Amer: 6 — ABNORMAL LOW
GFR calc non Af Amer: 6 — ABNORMAL LOW
GFR calc non Af Amer: 9 — ABNORMAL LOW
Glucose, Bld: 137 — ABNORMAL HIGH
Glucose, Bld: 93
Phosphorus: 2.7
Phosphorus: 2.7
Phosphorus: 3.7
Potassium: 3.9
Potassium: 4.5
Potassium: 4.6
Sodium: 126 — ABNORMAL LOW
Sodium: 130 — ABNORMAL LOW
Sodium: 133 — ABNORMAL LOW

## 2011-04-18 LAB — CROSSMATCH
ABO/RH(D): A POS
Antibody Screen: POSITIVE
Donor AG Type: NEGATIVE

## 2011-04-18 LAB — CBC
HCT: 23.9 — ABNORMAL LOW
HCT: 24.6 — ABNORMAL LOW
HCT: 25.7 — ABNORMAL LOW
Hemoglobin: 8.2 — ABNORMAL LOW
Hemoglobin: 8.3 — ABNORMAL LOW
Hemoglobin: 8.3 — ABNORMAL LOW
Hemoglobin: 8.5 — ABNORMAL LOW
Hemoglobin: 8.8 — ABNORMAL LOW
MCHC: 32.9
MCHC: 33.2
MCHC: 33.2
MCV: 100.1 — ABNORMAL HIGH
MCV: 100.3 — ABNORMAL HIGH
MCV: 99.6
Platelets: 128 — ABNORMAL LOW
Platelets: 144 — ABNORMAL LOW
RBC: 2.4 — ABNORMAL LOW
RBC: 2.47 — ABNORMAL LOW
RBC: 2.58 — ABNORMAL LOW
RBC: 2.65 — ABNORMAL LOW
RDW: 22.8 — ABNORMAL HIGH
RDW: 23 — ABNORMAL HIGH
RDW: 23.2 — ABNORMAL HIGH
RDW: 23.4 — ABNORMAL HIGH
WBC: 3.8 — ABNORMAL LOW
WBC: 4.2
WBC: 5

## 2011-04-18 LAB — BASIC METABOLIC PANEL
CO2: 26
Calcium: 9.4
Chloride: 95 — ABNORMAL LOW
GFR calc Af Amer: 14 — ABNORMAL LOW
GFR calc Af Amer: 9 — ABNORMAL LOW
GFR calc non Af Amer: 11 — ABNORMAL LOW
Potassium: 4.2
Sodium: 129 — ABNORMAL LOW
Sodium: 134 — ABNORMAL LOW

## 2011-04-18 LAB — HEMOGLOBIN: Hemoglobin: 8.9 — ABNORMAL LOW

## 2011-04-18 LAB — IRON AND TIBC
Saturation Ratios: 38
TIBC: 149 — ABNORMAL LOW
UIBC: 92
UIBC: 95

## 2011-04-18 LAB — HEMOGLOBIN AND HEMATOCRIT, BLOOD
HCT: 24.4 — ABNORMAL LOW
HCT: 24.7 — ABNORMAL LOW

## 2011-04-18 LAB — PREPARE RBC (CROSSMATCH)

## 2011-04-18 LAB — FERRITIN: Ferritin: 448 — ABNORMAL HIGH (ref 10–291)

## 2011-04-18 LAB — PTH, INTACT AND CALCIUM: Calcium, Total (PTH): 9.6

## 2011-04-20 LAB — BASIC METABOLIC PANEL
BUN: 32 mg/dL — ABNORMAL HIGH (ref 6–23)
BUN: 42 mg/dL — ABNORMAL HIGH (ref 6–23)
BUN: 52 mg/dL — ABNORMAL HIGH (ref 6–23)
CO2: 25 mEq/L (ref 19–32)
CO2: 28 mEq/L (ref 19–32)
CO2: 28 mEq/L (ref 19–32)
Chloride: 101 mEq/L (ref 96–112)
Chloride: 95 mEq/L — ABNORMAL LOW (ref 96–112)
Chloride: 98 mEq/L (ref 96–112)
Creatinine, Ser: 6.56 mg/dL — ABNORMAL HIGH (ref 0.4–1.2)
Creatinine, Ser: 7.58 mg/dL — ABNORMAL HIGH (ref 0.4–1.2)
GFR calc Af Amer: 12 mL/min — ABNORMAL LOW (ref >60)
GFR calc non Af Amer: 7 mL/min — ABNORMAL LOW (ref >60)
Glucose, Bld: 66 mg/dL — ABNORMAL LOW (ref 70–99)
Glucose, Bld: 82 mg/dL (ref 70–99)
Glucose, Bld: 84 mg/dL (ref 70–99)
Glucose, Bld: 88 mg/dL (ref 70–99)
Potassium: 3.8 mEq/L (ref 3.5–5.1)
Potassium: 4.4 mEq/L (ref 3.5–5.1)
Sodium: 132 mEq/L — ABNORMAL LOW (ref 135–145)
Sodium: 134 mEq/L — ABNORMAL LOW (ref 135–145)

## 2011-04-20 LAB — POCT I-STAT 3, ART BLOOD GAS (G3+)
Acid-Base Excess: 1 mmol/L (ref 0.0–2.0)
Bicarbonate: 27.1 meq/L — ABNORMAL HIGH (ref 20.0–24.0)
O2 Saturation: 90 %
Patient temperature: 98.4
TCO2: 28 mmol/L (ref 0–100)
pCO2 arterial: 46.2 mmHg — ABNORMAL HIGH (ref 35.0–45.0)
pH, Arterial: 7.376 (ref 7.350–7.400)
pO2, Arterial: 60 mmHg — ABNORMAL LOW (ref 80.0–100.0)

## 2011-04-20 LAB — POCT I-STAT 3, VENOUS BLOOD GAS (G3P V)
Acid-Base Excess: 2 mmol/L (ref 0.0–2.0)
Bicarbonate: 27.5 mEq/L — ABNORMAL HIGH (ref 20.0–24.0)
O2 Saturation: 58 %
TCO2: 29 mmol/L (ref 0–100)
pCO2, Ven: 43.6 mmHg — ABNORMAL LOW (ref 45.0–50.0)
pH, Ven: 7.408 — ABNORMAL HIGH (ref 7.250–7.300)
pO2, Ven: 30 mmHg (ref 30.0–45.0)

## 2011-04-20 LAB — HEPARIN INDUCED THROMBOCYTOPENIA PNL
Heparin Induced Plt Ab: NEGATIVE
Patient O.D.: 0.285
Serotonin Release: 7 % (ref <20)

## 2011-04-20 LAB — DIFFERENTIAL
Basophils Absolute: 0 K/uL (ref 0.0–0.1)
Basophils Relative: 0 % (ref 0–1)
Eosinophils Absolute: 0 K/uL (ref 0.0–0.7)
Eosinophils Relative: 0 % (ref 0–5)
Lymphocytes Relative: 3 % — ABNORMAL LOW (ref 12–46)
Lymphs Abs: 0.2 10*3/uL — ABNORMAL LOW (ref 0.7–4.0)
Monocytes Absolute: 0.1 K/uL (ref 0.1–1.0)
Monocytes Relative: 1 % — ABNORMAL LOW (ref 3–12)
Neutro Abs: 6.1 K/uL (ref 1.7–7.7)
Neutrophils Relative %: 95 % — ABNORMAL HIGH (ref 43–77)

## 2011-04-20 LAB — CBC
HCT: 27 % — ABNORMAL LOW (ref 36.0–46.0)
HCT: 29.7 % — ABNORMAL LOW (ref 36.0–46.0)
HCT: 30.8 % — ABNORMAL LOW (ref 36.0–46.0)
Hemoglobin: 8.9 g/dL — ABNORMAL LOW (ref 12.0–15.0)
Hemoglobin: 9.5 g/dL — ABNORMAL LOW (ref 12.0–15.0)
Hemoglobin: 9.9 g/dL — ABNORMAL LOW (ref 12.0–15.0)
MCHC: 32 g/dL (ref 30.0–36.0)
MCHC: 32.2 g/dL (ref 30.0–36.0)
MCHC: 32.2 g/dL (ref 30.0–36.0)
MCHC: 32.9 g/dL (ref 30.0–36.0)
MCV: 105.8 fL — ABNORMAL HIGH (ref 78.0–100.0)
MCV: 106.4 fL — ABNORMAL HIGH (ref 78.0–100.0)
MCV: 106.8 fL — ABNORMAL HIGH (ref 78.0–100.0)
Platelets: 80 10*3/uL — ABNORMAL LOW (ref 150–400)
Platelets: 82 10*3/uL — ABNORMAL LOW (ref 150–400)
Platelets: 83 10*3/uL — ABNORMAL LOW (ref 150–400)
RBC: 2.58 MIL/uL — ABNORMAL LOW (ref 3.87–5.11)
RBC: 2.79 MIL/uL — ABNORMAL LOW (ref 3.87–5.11)
RBC: 2.91 MIL/uL — ABNORMAL LOW (ref 3.87–5.11)
RDW: 20.4 % — ABNORMAL HIGH (ref 11.5–15.5)
RDW: 20.7 % — ABNORMAL HIGH (ref 11.5–15.5)
RDW: 21.4 % — ABNORMAL HIGH (ref 11.5–15.5)
RDW: 21.6 % — ABNORMAL HIGH (ref 11.5–15.5)
WBC: 6.4 10*3/uL (ref 4.0–10.5)
WBC: 7 10*3/uL (ref 4.0–10.5)
WBC: 8.2 K/uL (ref 4.0–10.5)

## 2011-04-20 LAB — POTASSIUM: Potassium: 5.4 mEq/L — ABNORMAL HIGH (ref 3.5–5.1)

## 2011-04-20 LAB — RENAL FUNCTION PANEL
Albumin: 2.6 g/dL — ABNORMAL LOW (ref 3.5–5.2)
CO2: 27 mEq/L (ref 19–32)
Calcium: 8.9 mg/dL (ref 8.4–10.5)
Creatinine, Ser: 6.22 mg/dL — ABNORMAL HIGH (ref 0.4–1.2)
GFR calc Af Amer: 9 mL/min — ABNORMAL LOW (ref >60)
GFR calc non Af Amer: 7 mL/min — ABNORMAL LOW (ref >60)
Sodium: 131 mEq/L — ABNORMAL LOW (ref 135–145)

## 2011-04-20 LAB — COMPREHENSIVE METABOLIC PANEL WITH GFR
ALT: 27 U/L (ref 0–35)
Albumin: 2.7 g/dL — ABNORMAL LOW (ref 3.5–5.2)
Alkaline Phosphatase: 390 U/L — ABNORMAL HIGH (ref 39–117)
BUN: 35 mg/dL — ABNORMAL HIGH (ref 6–23)
Chloride: 102 meq/L (ref 96–112)
Potassium: 4.9 meq/L (ref 3.5–5.1)
Sodium: 136 meq/L (ref 135–145)
Total Bilirubin: 1.2 mg/dL (ref 0.3–1.2)

## 2011-04-20 LAB — TROPONIN I
Troponin I: 0.04 ng/mL (ref 0.00–0.06)
Troponin I: 0.12 ng/mL — ABNORMAL HIGH (ref 0.00–0.06)

## 2011-04-20 LAB — PHOSPHORUS
Phosphorus: 4.2 mg/dL (ref 2.3–4.6)
Phosphorus: 4.4 mg/dL (ref 2.3–4.6)

## 2011-04-20 LAB — BASIC METABOLIC PANEL WITH GFR
Calcium: 8.8 mg/dL (ref 8.4–10.5)
GFR calc Af Amer: 8 mL/min — ABNORMAL LOW (ref >60)
GFR calc non Af Amer: 7 mL/min — ABNORMAL LOW (ref >60)
Potassium: 5.7 meq/L — ABNORMAL HIGH (ref 3.5–5.1)
Sodium: 136 meq/L (ref 135–145)

## 2011-04-20 LAB — CULTURE, BLOOD (ROUTINE X 2)

## 2011-04-20 LAB — MAGNESIUM
Magnesium: 2.4 mg/dL (ref 1.5–2.5)
Magnesium: 2.4 mg/dL (ref 1.5–2.5)

## 2011-04-20 LAB — COMPREHENSIVE METABOLIC PANEL
AST: 39 U/L — ABNORMAL HIGH (ref 0–37)
CO2: 24 mEq/L (ref 19–32)
Calcium: 8.9 mg/dL (ref 8.4–10.5)
Creatinine, Ser: 5.97 mg/dL — ABNORMAL HIGH (ref 0.4–1.2)
GFR calc Af Amer: 9 mL/min — ABNORMAL LOW (ref >60)
GFR calc non Af Amer: 7 mL/min — ABNORMAL LOW (ref >60)
Glucose, Bld: 98 mg/dL (ref 70–99)
Total Protein: 6 g/dL (ref 6.0–8.3)

## 2011-04-20 LAB — TSH: TSH: 0.892 u[IU]/mL (ref 0.350–4.500)

## 2011-04-20 LAB — GLUCOSE, CAPILLARY: Glucose-Capillary: 82 mg/dL (ref 70–99)

## 2011-04-20 LAB — POCT I-STAT 4, (NA,K, GLUC, HGB,HCT)
HCT: 34 % — ABNORMAL LOW (ref 36.0–46.0)
Sodium: 130 mEq/L — ABNORMAL LOW (ref 135–145)

## 2011-04-20 LAB — OCCULT BLOOD X 1 CARD TO LAB, STOOL: Fecal Occult Bld: NEGATIVE

## 2011-04-20 LAB — CK TOTAL AND CKMB (NOT AT ARMC)
CK, MB: 1.2 ng/mL (ref 0.3–4.0)
Relative Index: INVALID (ref 0.0–2.5)
Total CK: <7 U/L — ABNORMAL LOW (ref 7–177)

## 2011-04-20 LAB — LACTIC ACID, PLASMA: Lactic Acid, Venous: 2 mmol/L (ref 0.5–2.2)

## 2011-04-20 LAB — APTT: aPTT: 35 s (ref 24–37)

## 2011-04-20 LAB — HEMOGLOBIN: Hemoglobin: 8.9 g/dL — ABNORMAL LOW (ref 12.0–15.0)

## 2011-04-20 LAB — HEPARIN ANTIBODY SCREEN: Heparin Antibody Screen: POSITIVE

## 2011-04-20 LAB — PROTIME-INR
INR: 1.1 (ref 0.00–1.49)
Prothrombin Time: 14.8 seconds (ref 11.6–15.2)

## 2011-04-23 LAB — POCT I-STAT 4, (NA,K, GLUC, HGB,HCT)
Glucose, Bld: 89
HCT: 38
Hemoglobin: 12.9
Operator id: 181601
Potassium: 4.6
Sodium: 133 — ABNORMAL LOW

## 2011-04-24 LAB — POCT I-STAT 4, (NA,K, GLUC, HGB,HCT): Hemoglobin: 13.6

## 2011-04-26 LAB — POCT I-STAT 4, (NA,K, GLUC, HGB,HCT)
Glucose, Bld: 98
HCT: 39
Operator id: 274481
Potassium: 4
Sodium: 134 — ABNORMAL LOW

## 2011-04-27 LAB — I-STAT 8, (EC8 V) (CONVERTED LAB)
Acid-Base Excess: 2
BUN: 16
BUN: 61 — ABNORMAL HIGH
Bicarbonate: 27.1 — ABNORMAL HIGH
Chloride: 101
Chloride: 103
Glucose, Bld: 74
HCT: 35 — ABNORMAL LOW
HCT: 39
Hemoglobin: 13.3
Operator id: 151321
Operator id: 294521
Potassium: 5.2 — ABNORMAL HIGH
Sodium: 133 — ABNORMAL LOW
TCO2: 28
pCO2, Ven: 52.3 — ABNORMAL HIGH
pCO2, Ven: 45.3
pH, Ven: 7.385 — ABNORMAL HIGH

## 2011-04-27 LAB — POCT I-STAT 4, (NA,K, GLUC, HGB,HCT)
HCT: 35 — ABNORMAL LOW
HCT: 37
Hemoglobin: 11.9 — ABNORMAL LOW
Hemoglobin: 12.6
Operator id: 181601
Potassium: 3.9
Potassium: 5.1
Sodium: 131 — ABNORMAL LOW
Sodium: 136

## 2011-04-27 LAB — DIFFERENTIAL
Basophils Absolute: 0
Lymphocytes Relative: 27
Monocytes Absolute: 0.4
Neutro Abs: 2.6
Neutrophils Relative %: 59

## 2011-04-27 LAB — HEMOGLOBIN AND HEMATOCRIT, BLOOD
HCT: 34.9 — ABNORMAL LOW
Hemoglobin: 11.4 — ABNORMAL LOW

## 2011-04-27 LAB — CBC
Hemoglobin: 10.5 — ABNORMAL LOW
RBC: 3.01 — ABNORMAL LOW
RDW: 16.4 — ABNORMAL HIGH

## 2011-04-27 LAB — POCT I-STAT CREATININE
Creatinine, Ser: 4.5 — ABNORMAL HIGH
Operator id: 151321

## 2011-04-27 LAB — POTASSIUM: Potassium: 5.1

## 2011-04-30 LAB — I-STAT 8, (EC8 V) (CONVERTED LAB)
Acid-Base Excess: 11 — ABNORMAL HIGH
BUN: 15
Bicarbonate: 36.4 — ABNORMAL HIGH
Chloride: 99
Glucose, Bld: 94
HCT: 37
Hemoglobin: 12.6
Operator id: 291361
Potassium: 4
Sodium: 136
TCO2: 38
pCO2, Ven: 52.3 — ABNORMAL HIGH
pH, Ven: 7.451 — ABNORMAL HIGH

## 2011-04-30 LAB — CBC
HCT: 34 — ABNORMAL LOW
Hemoglobin: 11.2 — ABNORMAL LOW
MCV: 108.8 — ABNORMAL HIGH
Platelets: 194
RDW: 17.9 — ABNORMAL HIGH

## 2011-04-30 LAB — DIFFERENTIAL
Basophils Absolute: 0
Lymphs Abs: 0.7
Monocytes Absolute: 0.3
Neutro Abs: 3.6

## 2011-04-30 LAB — POCT I-STAT CREATININE
Creatinine, Ser: 3.8 — ABNORMAL HIGH
Operator id: 291361

## 2011-05-02 LAB — CBC
Hemoglobin: 10 — ABNORMAL LOW
Hemoglobin: 10.8 — ABNORMAL LOW
MCHC: 32.8
MCHC: 33
MCV: 107.1 — ABNORMAL HIGH
MCV: 107.9 — ABNORMAL HIGH
Platelets: 179
RBC: 2.84 — ABNORMAL LOW
RBC: 3.64 — ABNORMAL LOW
RDW: 18.7 — ABNORMAL HIGH
RDW: 18.7 — ABNORMAL HIGH
WBC: 8.1

## 2011-05-02 LAB — I-STAT 8, (EC8 V) (CONVERTED LAB)
Acid-base deficit: 2
Chloride: 102
HCT: 33 — ABNORMAL LOW
Hemoglobin: 11.2 — ABNORMAL LOW
Operator id: 151321
Potassium: 4.5
Sodium: 132 — ABNORMAL LOW

## 2011-05-02 LAB — RENAL FUNCTION PANEL
Albumin: 2.8 — ABNORMAL LOW
BUN: 64 — ABNORMAL HIGH
CO2: 26
Calcium: 9.5
Chloride: 93 — ABNORMAL LOW
Creatinine, Ser: 9.24 — ABNORMAL HIGH
GFR calc Af Amer: 6 — ABNORMAL LOW
GFR calc non Af Amer: 5 — ABNORMAL LOW
Glucose, Bld: 85
Phosphorus: 7.4 — ABNORMAL HIGH
Potassium: 5.6 — ABNORMAL HIGH
Sodium: 130 — ABNORMAL LOW

## 2011-05-02 LAB — COMPREHENSIVE METABOLIC PANEL
ALT: 11
AST: 16
Albumin: 3.7
CO2: 29
Chloride: 95 — ABNORMAL LOW
Creatinine, Ser: 8.04 — ABNORMAL HIGH
GFR calc Af Amer: 6 — ABNORMAL LOW
GFR calc non Af Amer: 5 — ABNORMAL LOW
Potassium: 4.9
Sodium: 138
Total Bilirubin: 0.7

## 2011-05-02 LAB — DIFFERENTIAL
Basophils Absolute: 0
Basophils Absolute: 0
Basophils Relative: 0
Eosinophils Absolute: 0.1
Eosinophils Absolute: 0.2
Eosinophils Relative: 1
Lymphocytes Relative: 6 — ABNORMAL LOW
Monocytes Absolute: 0.2
Monocytes Absolute: 0.3
Monocytes Relative: 7

## 2011-05-02 LAB — POCT CARDIAC MARKERS
CKMB, poc: <1 — ABNORMAL LOW
CKMB, poc: <1 — ABNORMAL LOW
Myoglobin, poc: 138
Operator id: 192351
Troponin i, poc: <0.05
Troponin i, poc: <0.05

## 2011-05-02 LAB — CULTURE, BLOOD (ROUTINE X 2)

## 2011-05-02 LAB — POCT I-STAT CREATININE: Creatinine, Ser: 10.8 — ABNORMAL HIGH

## 2011-05-03 ENCOUNTER — Emergency Department (HOSPITAL_COMMUNITY)
Admission: EM | Admit: 2011-05-03 | Discharge: 2011-05-04 | Disposition: A | Discharge status: Home / Self Care | Payer: Medicare Other | Attending: Emergency Medicine | Admitting: Emergency Medicine

## 2011-05-03 ENCOUNTER — Emergency Department (HOSPITAL_COMMUNITY): Payer: Medicare Other

## 2011-05-03 DIAGNOSIS — R0602 Shortness of breath: Secondary | ICD-10-CM | POA: Insufficient documentation

## 2011-05-03 DIAGNOSIS — R05 Cough: Secondary | ICD-10-CM | POA: Insufficient documentation

## 2011-05-03 DIAGNOSIS — G4730 Sleep apnea, unspecified: Secondary | ICD-10-CM | POA: Insufficient documentation

## 2011-05-03 DIAGNOSIS — Z992 Dependence on renal dialysis: Secondary | ICD-10-CM | POA: Insufficient documentation

## 2011-05-03 DIAGNOSIS — R079 Chest pain, unspecified: Secondary | ICD-10-CM | POA: Insufficient documentation

## 2011-05-03 DIAGNOSIS — R569 Unspecified convulsions: Secondary | ICD-10-CM | POA: Insufficient documentation

## 2011-05-03 DIAGNOSIS — N186 End stage renal disease: Secondary | ICD-10-CM | POA: Insufficient documentation

## 2011-05-03 DIAGNOSIS — R059 Cough, unspecified: Secondary | ICD-10-CM | POA: Insufficient documentation

## 2011-05-03 DIAGNOSIS — K219 Gastro-esophageal reflux disease without esophagitis: Secondary | ICD-10-CM | POA: Insufficient documentation

## 2011-05-03 LAB — RENAL FUNCTION PANEL
BUN: 48 — ABNORMAL HIGH
CO2: 24
Calcium: 8.5
Chloride: 99
Creatinine, Ser: 8.4 — ABNORMAL HIGH
GFR calc Af Amer: 6 — ABNORMAL LOW
GFR calc non Af Amer: 5 — ABNORMAL LOW
Glucose, Bld: 75

## 2011-05-03 LAB — CBC
HCT: 34.1 — ABNORMAL LOW
Hemoglobin: 11.1 — ABNORMAL LOW
MCV: 108.1 — ABNORMAL HIGH
Platelets: 135 — ABNORMAL LOW
RDW: 19.9 — ABNORMAL HIGH

## 2011-05-03 LAB — POCT I-STAT 4, (NA,K, GLUC, HGB,HCT)
Glucose, Bld: 96
Operator id: 274481
Potassium: 4.6
Sodium: 134 — ABNORMAL LOW

## 2011-05-04 ENCOUNTER — Encounter: Payer: Self-pay | Admitting: Family Medicine

## 2011-05-04 ENCOUNTER — Ambulatory Visit (INDEPENDENT_AMBULATORY_CARE_PROVIDER_SITE_OTHER): Payer: Medicare Other | Admitting: Family Medicine

## 2011-05-04 VITALS — BP 130/80 | HR 94 | Temp 99.5°F | Wt 296.0 lb

## 2011-05-04 DIAGNOSIS — B349 Viral infection, unspecified: Secondary | ICD-10-CM

## 2011-05-04 DIAGNOSIS — B9789 Other viral agents as the cause of diseases classified elsewhere: Secondary | ICD-10-CM

## 2011-05-04 NOTE — Patient Instructions (Signed)
Viral Syndrome You or your child has Viral Syndrome. It is the most common infection causing "colds" and infections in the nose, throat, sinuses, and breathing tubes. Sometimes the infection causes nausea, vomiting, or diarrhea. The germ that causes the infection is a virus. No antibiotic or other medicine will kill it. There are medicines that you can take to make you or your child more comfortable.  HOME CARE INSTRUCTIONS   Rest in bed until you start to feel better.   If you have diarrhea or vomiting, eat small amounts of crackers and toast. Soup is helpful.   Do not give aspirin or medicine that contains aspirin to children.   Only take over-the-counter or prescription medicines for pain, discomfort, or fever as directed by your caregiver.  SEEK IMMEDIATE MEDICAL CARE IF:   You or your child has not improved within one week.   You or your child has pain that is not at least partially relieved by over-the-counter medicine.   Thick, colored mucus or blood is coughed up.   Discharge from the nose becomes thick yellow or green.   Diarrhea or vomiting gets worse.   There is any major change in your or your child's condition.   You or your child develops a skin rash, stiff neck, severe headache, or are unable to hold down food or fluid.   You or your child has an oral temperature above 102 F (38.9 C), not controlled by medicine.   Your baby is older than 3 months with a rectal temperature of 102 F (38.9 C) or higher.   Your baby is 48 months old or younger with a rectal temperature of 100.4 F (38 C) or higher.  Document Released: 06/17/2006 Document Revised: 03/14/2011 Document Reviewed: 06/18/2007 [facility] Patient Information 2012 [place], [place].  Consider Tylenol for body aches Follow up promptly for any fever or increased shortness of breath.

## 2011-05-04 NOTE — Progress Notes (Signed)
  Subjective:    Patient ID: [name], female    DOB: Nov 16, 1955, 55 y.o.   MRN: [id number]  HPI  Acute visit. Onset 4 days ago of cough and nasal congestion and frequent sneezing. Question low-grade fever during the week. Diffuse body aches. Occasional mild headache. Sore throat. Last night went to emergency room because of chest pain associated with coughing. No hemoptysis. No pleuritic pain. Chest x-ray question early interstitial edema. No known history of CHF.  Denies orthopnea. No clear increase in dyspnea with exertion. Patient given albuterol inhaler. Takes Advair at baseline. Denies any definite fever today. No nausea, vomiting, or diarrhea.  Chronic problems include history of tuberous sclerosis, obstructive sleep apnea, chronic kidney disease on dialysis, GERD, osteoarthritis, and history of epilepsy.   Review of Systems  Constitutional: Negative for fever and chills.  HENT: Positive for congestion and sinus pressure. Negative for ear pain and sore throat.   Respiratory: Positive for cough. Negative for wheezing.   Cardiovascular: Negative for chest pain and leg swelling.  Gastrointestinal: Negative for nausea and vomiting.  Neurological: Positive for headaches. Negative for dizziness.       Objective:   Physical Exam  Constitutional: She is oriented to person, place, and time. She appears well-developed and well-nourished. No distress.  HENT:  Right Ear: External ear normal.  Left Ear: External ear normal.  Mouth/Throat: Oropharynx is clear and moist.  Neck: Neck supple.  Cardiovascular: Normal rate and regular rhythm.   Pulmonary/Chest: Effort normal and breath sounds normal. No respiratory distress. She has no wheezes. She has no rales.  Musculoskeletal: She exhibits no edema.  Neurological: She is alert and oriented to person, place, and time.          Assessment & Plan:  Suspect viral syndrome. Tylenol as needed for body aches. No evidence to suggest  overt pulmonary edema. Follow up promptly for fever or worsening shortness of breath.

## 2011-05-21 ENCOUNTER — Emergency Department (HOSPITAL_COMMUNITY)
Admission: EM | Admit: 2011-05-21 | Discharge: 2011-05-22 | Disposition: A | Discharge status: Home / Self Care | Payer: Medicare Other | Attending: Emergency Medicine | Admitting: Emergency Medicine

## 2011-05-21 ENCOUNTER — Encounter (HOSPITAL_COMMUNITY): Payer: Self-pay | Admitting: *Deleted

## 2011-05-21 DIAGNOSIS — M1990 Unspecified osteoarthritis, unspecified site: Secondary | ICD-10-CM | POA: Insufficient documentation

## 2011-05-21 DIAGNOSIS — Z79899 Other long term (current) drug therapy: Secondary | ICD-10-CM | POA: Insufficient documentation

## 2011-05-21 DIAGNOSIS — F79 Unspecified intellectual disabilities: Secondary | ICD-10-CM | POA: Insufficient documentation

## 2011-05-21 DIAGNOSIS — K219 Gastro-esophageal reflux disease without esophagitis: Secondary | ICD-10-CM | POA: Insufficient documentation

## 2011-05-21 DIAGNOSIS — L989 Disorder of the skin and subcutaneous tissue, unspecified: Secondary | ICD-10-CM | POA: Insufficient documentation

## 2011-05-21 DIAGNOSIS — R42 Dizziness and giddiness: Secondary | ICD-10-CM | POA: Insufficient documentation

## 2011-05-21 DIAGNOSIS — R5383 Other fatigue: Secondary | ICD-10-CM | POA: Insufficient documentation

## 2011-05-21 DIAGNOSIS — R569 Unspecified convulsions: Secondary | ICD-10-CM

## 2011-05-21 DIAGNOSIS — G40909 Epilepsy, unspecified, not intractable, without status epilepticus: Secondary | ICD-10-CM | POA: Insufficient documentation

## 2011-05-21 DIAGNOSIS — N186 End stage renal disease: Secondary | ICD-10-CM | POA: Insufficient documentation

## 2011-05-21 DIAGNOSIS — F3289 Other specified depressive episodes: Secondary | ICD-10-CM | POA: Insufficient documentation

## 2011-05-21 DIAGNOSIS — Z992 Dependence on renal dialysis: Secondary | ICD-10-CM | POA: Insufficient documentation

## 2011-05-21 DIAGNOSIS — F329 Major depressive disorder, single episode, unspecified: Secondary | ICD-10-CM | POA: Insufficient documentation

## 2011-05-21 DIAGNOSIS — E119 Type 2 diabetes mellitus without complications: Secondary | ICD-10-CM | POA: Insufficient documentation

## 2011-05-21 DIAGNOSIS — R5381 Other malaise: Secondary | ICD-10-CM | POA: Insufficient documentation

## 2011-05-21 NOTE — ED Notes (Signed)
The pt says she does not have  Usually have seizures.  She says  She had too many things going on and she felt like her head was swimming.  She came from [facility].  She had dialysis earlier today.  Shunt in her rt upper arm.  She is alert says she did not sleep last pm too much going on at the nursing home

## 2011-05-22 LAB — CBC
HCT: 32 % — ABNORMAL LOW (ref 36.0–46.0)
Hemoglobin: 10.1 g/dL — ABNORMAL LOW (ref 12.0–15.0)
MCH: 32.7 pg (ref 26.0–34.0)
MCHC: 31.6 g/dL (ref 30.0–36.0)
RDW: 16.2 % — ABNORMAL HIGH (ref 11.5–15.5)

## 2011-05-22 LAB — POCT I-STAT, CHEM 8
Creatinine, Ser: 5.4 mg/dL — ABNORMAL HIGH (ref 0.50–1.10)
Glucose, Bld: 90 mg/dL (ref 70–99)
HCT: 33 % — ABNORMAL LOW (ref 36.0–46.0)
Hemoglobin: 11.2 g/dL — ABNORMAL LOW (ref 12.0–15.0)
Sodium: 137 mEq/L (ref 135–145)
TCO2: 31 mmol/L (ref 0–100)

## 2011-05-22 NOTE — ED Notes (Signed)
A&Ox4, NAD calm interactive, skin W&D, resps e/u, speaking in clear complete sentences, here s/p Sz at ~ 1730, "just stares", h/o sz, last sz > 1 yr ago, felt fine prior to sz, denies recent illness except sinus cold, "fell last Wednesday", denies injury or fall with tonights sz, no oral trauma or incontinance, "does not make urine", has HD MWF, attended yesterday, R arm access site, skin sores noted and open (clear flulid draining), "felt a little light headed earlier", (denies: pain, nvd, fever, sob), LS CTA, MAEx4. Radial pulses strong & equal. "Has been taking topamax and primadone as prescribed for sz".

## 2011-05-22 NOTE — ED Provider Notes (Signed)
History     CSN: [id number] Arrival date & time: 05/21/2011  6:46 PM   First MD Initiated Contact with Patient 05/22/11 0025      Chief Complaint  Patient presents with  . Seizures    (Consider location/radiation/quality/duration/timing/severity/associated sxs/prior treatment) Patient is a 55 y.o. female presenting with seizures. The history is provided by the patient.  Seizures  This is a recurrent problem. Pertinent negatives include no headaches, no speech difficulty and no chest pain. Characteristics do not include apnea.   patient states that she had a seizure at dinner tonight. She has a seizure history. Last was over a year ago. States that she felt like her head was swimming also. She had dialysis earlier today. She has not been sleeping much due to her roommates at the nursing home. States she still feels a little weak. No fevers. She's been eating okay. She's not been sleeping well. She does not make urine. She has chronic trouble breathing, but no change now. No fevers. No trauma.  Past Medical History  Diagnosis Date  . Diabetes mellitus   . Renal insufficiency   . Mental retardation   . Osteoarthritis   . Sleep apnea   . End-stage renal disease on hemodialysis   . Tuberous sclerosis   . Obesity   . Seizures   . GERD (gastroesophageal reflux disease)   . Depression   . Mental retardation   . Sleep apnea     on c-pap  . Hx MRSA infection     Past Surgical History  Procedure Date  . Shunt in arm for dialysis   . Uterine tumor   . Nephrectomy     Family History  Problem Relation Age of Onset  . Diabetes Mother   . Cancer Mother     History  Substance Use Topics  . Smoking status: Never Smoker   . Smokeless tobacco: Not on file  . Alcohol Use: No    OB History    Grav Para Term Preterm Abortions TAB SAB Ect Mult Living                  Review of Systems  Constitutional: Negative for chills, diaphoresis and appetite change.  HENT: Negative for  neck pain.   Respiratory: Negative for apnea.   Cardiovascular: Negative for chest pain.  Musculoskeletal: Negative for back pain.  Neurological: Positive for dizziness and seizures. Negative for syncope, speech difficulty and headaches.  Psychiatric/Behavioral: Negative for behavioral problems.    Allergies  Iodinated diagnostic agents and Methylprednisolone  Home Medications   Current Outpatient Rx  Name Route Sig Dispense Refill  . NEPHRO-VITE 0.8 MG PO TABS Oral Take 0.8 mg by mouth at bedtime.      Marland Kitchen BISACODYL 10 MG RE SUPP Rectal Place 10 mg rectally as needed.      Marland Kitchen CALCIUM ACETATE 667 MG PO CAPS Oral Take 667 mg by mouth. 2 with meals    Three times a day 1-2 extra with snacks     . CELECOXIB 200 MG PO CAPS Oral Take 200 mg by mouth 2 (two) times daily as needed.      Marland Kitchen CLONAZEPAM 0.5 MG PO TABS Oral Take 0.5 mg by mouth. One on days of dialysis     . CYCLOBENZAPRINE HCL 10 MG PO TABS Oral Take 10 mg by mouth 2 (two) times daily.      . DULOXETINE HCL 30 MG PO CPEP Oral Take 30 mg by mouth 2 (two)  times daily.     Marland Kitchen MIMYX EX CREA Apply externally Apply topically. Apply to legs at bedtime     . ESOMEPRAZOLE MAGNESIUM 40 MG PO CPDR Oral Take 40 mg by mouth daily before breakfast.      . FEXOFENADINE HCL 180 MG PO TABS Oral Take 180 mg by mouth daily as needed.      Marland Kitchen FLUTICASONE PROPIONATE 50 MCG/ACT NA SUSP Nasal Place 2 sprays into the nose daily.      Marland Kitchen FLUTICASONE-SALMETEROL 250-50 MCG/DOSE IN AEPB Inhalation Inhale 1 puff into the lungs every 12 (twelve) hours.      Marland Kitchen HYDROCORTISONE ACE-PRAMOXINE 2.5-1 % RE CREA Rectal Place rectally 2 (two) times daily.      Marland Kitchen POLYSACCHARIDE IRON COMPLEX 150 MG PO CAPS Oral Take 150 mg by mouth. Alternates with ferri-seque every day     . KETOCONAZOLE 2 % EX CREA Topical Apply topically 3 (three) times daily as needed.      Marland Kitchen MIDODRINE HCL 10 MG PO TABS Oral Take 10 mg by mouth 3 (three) times daily.      Marland Kitchen MUPIROCIN 2 % EX OINT Topical  Apply topically 2 (two) times daily.      Marland Kitchen ONDANSETRON HCL 4 MG PO TABS Oral Take 4 mg by mouth every 8 (eight) hours as needed.      Marland Kitchen PRIMIDONE 250 MG PO TABS Oral Take 750 mg by mouth 2 (two) times daily.      Marland Kitchen SEVELAMER HCL 800 MG PO TABS Oral Take 800 mg by mouth 3 (three) times daily with meals.      . TOPIRAMATE 200 MG PO TABS Oral Take 200 mg by mouth 2 (two) times daily.        BP 114/67  Pulse 70  Temp(Src) 98.6 F (37 C) (Oral)  Resp 16  SpO2 97%  Physical Exam  Nursing note and vitals reviewed. Constitutional: She is oriented to person, place, and time. She appears well-developed and well-nourished.  HENT:  Head: Normocephalic and atraumatic.  Eyes: EOM are normal. Pupils are equal, round, and reactive to light.  Neck: Normal range of motion. Neck supple.  Cardiovascular: Normal rate, regular rhythm and normal heart sounds.   No murmur heard. Pulmonary/Chest: Effort normal and breath sounds normal. No respiratory distress. She has no wheezes. She has no rales.  Abdominal: Soft. Bowel sounds are normal. She exhibits no distension. There is no tenderness. There is no rebound and no guarding.  Musculoskeletal: Normal range of motion.       Right upper extremity dialysis graft.  Neurological: She is alert and oriented to person, place, and time. No cranial nerve deficit.  Skin: Skin is warm and dry.       2 ulcers to right forearm.  Psychiatric: She has a normal mood and affect. Her speech is normal.    ED Course  Procedures (including critical care time)  Labs Reviewed  CBC - Abnormal; Notable for the following:    RBC 3.09 (*)    Hemoglobin 10.1 (*)    HCT 32.0 (*)    MCV 103.6 (*)    RDW 16.2 (*)    Platelets 144 (*)    All other components within normal limits  POCT I-STAT, CHEM 8 - Abnormal; Notable for the following:    BUN 36 (*)    Creatinine, Ser 5.40 (*)    Hemoglobin 11.2 (*)    HCT 33.0 (*)    All other components within  normal limits  I-STAT,  CHEM 8   No results found.   1. Seizure       MDM  Patient potentially had a seizure. She's a history of same. She states she's been not sleeping. She is at baseline neuro exam now. Lab work is stable. Vitals is reassuring. She'll need to follow with her PCP.        Juliet Rude. Rubin Payor, MD 05/22/11 0140

## 2011-05-22 NOTE — ED Notes (Signed)
No changes, [facility] paged for transport, preparing to d/c.

## 2011-05-22 NOTE — Discharge Instructions (Signed)
Epilepsy A seizure (convulsion) is a sudden change in brain function that causes a change in behavior, muscle activity, or ability to remain awake and alert. If a person has recurring seizures, this is called epilepsy. CAUSES  Epilepsy is a disorder with many possible causes. Anything that disturbs the normal pattern of brain cell activity can lead to seizures. Seizure can be caused from illness to brain damage to abnormal brain development. Epilepsy may develop because of:  An abnormality in brain wiring.   An imbalance of nerve signaling chemicals (neurotransmitters).   Some combination of these factors.  Scientists are learning an increasing amount about genetic causes of seizures. SYMPTOMS  The symptoms of a seizure can vary greatly from one person to another. These may include:  An aura, or warning that tells a person they are about to have a seizure.   Abnormal sensations, such as abnormal smell or seeing flashing lights.   Sudden, general body stiffness.   Rhythmic jerking of the face, arm, or leg - on one or both sides.   Sudden change in consciousness.   The person may appear to be awake but not responding.   They may appear to be asleep but cannot be awakened.   Grimacing, chewing, lip smacking, or drooling.   Often there is a period of sleepiness after a seizure.  DIAGNOSIS  The description you give to your caregiver about what you experienced will help them understand your problems. Equally important is the description by any witnesses to your seizure. A physical exam, including a detailed neurological exam, is necessary. An EEG (electroencephalogram) is a painless test of your brain waves. In this test a diagram is created of your brain waves. These diagrams can be interpreted by a specialist. Pictures of your brain are usually taken with:  An MRI.   A CT scan.  Lab tests may be done to look for:  Signs of infection.   Abnormal blood chemistry.  PREVENTION    There is no way to prevent the development of epilepsy. If you have seizures that are typically triggered by an event (such as flashing lights), try to avoid the trigger. This can help you avoid a seizure.  PROGNOSIS  Most people with epilepsy lead outwardly normal lives. While epilepsy cannot currently be cured, for some people it does eventually go away. Most seizures do not cause brain damage. It is not uncommon for people with epilepsy, especially children, to develop behavioral and emotional problems. These problems are sometimes the consequence of medicine for seizures or social stress. For some people with epilepsy, the risk of seizures restricts their independence and recreational activities. For example, some states refuse drivers licenses to people with epilepsy. Most women with epilepsy can become pregnant. They should discuss their epilepsy and the medicine they are taking with their caregivers. Women with epilepsy have a 90 percent or better chance of having a normal, healthy baby. RISKS AND COMPLICATIONS  People with epilepsy are at increased risk of falls, accidents, and injuries. People with epilepsy are at special risk for two life-threatening conditions. These are status epilepticus and sudden unexplained death (extremely rare). Status epilepticus is a long lasting, continuous seizure that is a medical emergency. TREATMENT  Once epilepsy is diagnosed, it is important to begin treatment as soon as possible. For about 80 percent of those diagnosed with epilepsy, seizures can be controlled with modern medicines and surgical techniques. Some antiepileptic drugs can interfere with the effectiveness of oral contraceptives. In 1997, the  FDA approved a pacemaker for the brain the (vagus nerve stimulator). This stimulator can be used for people with seizures that are not well-controlled by medicine. Studies have shown that in some cases, children may experience fewer seizures if they maintain a  strict diet. The strict diet is called the ketogenic diet. This diet is rich in fats and low in carbohydrates. HOME CARE INSTRUCTIONS   Your caregiver will make recommendations about driving and safety in normal activities. Follow these carefully.   Take any medicine prescribed exactly as directed.   Do any blood tests requested to monitor the levels of your medicine.   The people you live and work with should know that you are prone to seizures. They should receive instructions on how to help you. In general, a witness to a seizure should:   Cushion your head and body.   Turn you on your side.   Avoid unnecessarily restraining you.   Not place anything inside your mouth.   Call for local emergency medical help if there is any question about what has occurred.   Keep a seizure diary. Record what you recall about any seizure, especially any possible trigger.   If your caregiver has given you a follow-up appointment, it is very important to keep that appointment. Not keeping the appointment could result in permanent injury and disability. If there is any problem keeping the appointment, you must call back to this facility for assistance.  SEEK MEDICAL CARE IF:   You develop signs of infection or other illness. This might increase the risk of a seizure.   You seem to be having more frequent seizures.   Your seizure pattern is changing.  SEEK IMMEDIATE MEDICAL CARE IF:   A seizure does not stop after a few moments.   A seizure causes any difficulty in breathing.   A seizure results in a very severe headache.   A seizure leaves you with the inability to speak or use a part of your body.  MAKE SURE YOU:   Understand these instructions.   Will watch your condition.   Will get help right away if you are not doing well or get worse.  Document Released: 07/02/2005 Document Revised: 03/14/2011 Document Reviewed: 02/06/2008 [facility] Patient Information 2012 [place], [place].

## 2011-05-25 ENCOUNTER — Encounter (HOSPITAL_COMMUNITY): Payer: Self-pay | Admitting: *Deleted

## 2011-05-25 ENCOUNTER — Telehealth: Payer: Self-pay | Admitting: Internal Medicine

## 2011-05-25 ENCOUNTER — Emergency Department (HOSPITAL_COMMUNITY): Payer: Medicare Other

## 2011-05-25 ENCOUNTER — Observation Stay (HOSPITAL_COMMUNITY)
Admission: EM | Admit: 2011-05-25 | Discharge: 2011-05-26 | Disposition: A | Discharge status: Home / Self Care | Payer: Medicare Other | Attending: Internal Medicine | Admitting: Internal Medicine

## 2011-05-25 ENCOUNTER — Observation Stay (HOSPITAL_COMMUNITY): Payer: Medicare Other

## 2011-05-25 DIAGNOSIS — J984 Other disorders of lung: Secondary | ICD-10-CM | POA: Insufficient documentation

## 2011-05-25 DIAGNOSIS — S0003XA Contusion of scalp, initial encounter: Secondary | ICD-10-CM | POA: Insufficient documentation

## 2011-05-25 DIAGNOSIS — M159 Polyosteoarthritis, unspecified: Secondary | ICD-10-CM | POA: Insufficient documentation

## 2011-05-25 DIAGNOSIS — S0990XA Unspecified injury of head, initial encounter: Secondary | ICD-10-CM

## 2011-05-25 DIAGNOSIS — J309 Allergic rhinitis, unspecified: Secondary | ICD-10-CM | POA: Insufficient documentation

## 2011-05-25 DIAGNOSIS — F329 Major depressive disorder, single episode, unspecified: Secondary | ICD-10-CM | POA: Insufficient documentation

## 2011-05-25 DIAGNOSIS — G40209 Localization-related (focal) (partial) symptomatic epilepsy and epileptic syndromes with complex partial seizures, not intractable, without status epilepticus: Secondary | ICD-10-CM | POA: Insufficient documentation

## 2011-05-25 DIAGNOSIS — Q851 Tuberous sclerosis: Secondary | ICD-10-CM | POA: Insufficient documentation

## 2011-05-25 DIAGNOSIS — Z23 Encounter for immunization: Secondary | ICD-10-CM | POA: Insufficient documentation

## 2011-05-25 DIAGNOSIS — L240 Irritant contact dermatitis due to detergents: Secondary | ICD-10-CM | POA: Insufficient documentation

## 2011-05-25 DIAGNOSIS — T148XXA Other injury of unspecified body region, initial encounter: Secondary | ICD-10-CM

## 2011-05-25 DIAGNOSIS — W19XXXA Unspecified fall, initial encounter: Secondary | ICD-10-CM

## 2011-05-25 DIAGNOSIS — F332 Major depressive disorder, recurrent severe without psychotic features: Secondary | ICD-10-CM | POA: Insufficient documentation

## 2011-05-25 DIAGNOSIS — N186 End stage renal disease: Secondary | ICD-10-CM

## 2011-05-25 DIAGNOSIS — Z992 Dependence on renal dialysis: Secondary | ICD-10-CM | POA: Insufficient documentation

## 2011-05-25 DIAGNOSIS — Y9210 Unspecified residential institution as the place of occurrence of the external cause: Secondary | ICD-10-CM | POA: Insufficient documentation

## 2011-05-25 DIAGNOSIS — J45909 Unspecified asthma, uncomplicated: Secondary | ICD-10-CM | POA: Insufficient documentation

## 2011-05-25 DIAGNOSIS — W010XXA Fall on same level from slipping, tripping and stumbling without subsequent striking against object, initial encounter: Secondary | ICD-10-CM | POA: Insufficient documentation

## 2011-05-25 DIAGNOSIS — E875 Hyperkalemia: Secondary | ICD-10-CM | POA: Insufficient documentation

## 2011-05-25 DIAGNOSIS — S060X0A Concussion without loss of consciousness, initial encounter: Principal | ICD-10-CM | POA: Insufficient documentation

## 2011-05-25 DIAGNOSIS — S1093XA Contusion of unspecified part of neck, initial encounter: Secondary | ICD-10-CM | POA: Insufficient documentation

## 2011-05-25 DIAGNOSIS — G4733 Obstructive sleep apnea (adult) (pediatric): Secondary | ICD-10-CM | POA: Insufficient documentation

## 2011-05-25 DIAGNOSIS — K219 Gastro-esophageal reflux disease without esophagitis: Secondary | ICD-10-CM | POA: Insufficient documentation

## 2011-05-25 DIAGNOSIS — K5901 Slow transit constipation: Secondary | ICD-10-CM | POA: Insufficient documentation

## 2011-05-25 DIAGNOSIS — K645 Perianal venous thrombosis: Secondary | ICD-10-CM | POA: Insufficient documentation

## 2011-05-25 DIAGNOSIS — F79 Unspecified intellectual disabilities: Secondary | ICD-10-CM | POA: Insufficient documentation

## 2011-05-25 DIAGNOSIS — F3289 Other specified depressive episodes: Secondary | ICD-10-CM | POA: Insufficient documentation

## 2011-05-25 HISTORY — PX: OTHER SURGICAL HISTORY: SHX169

## 2011-05-25 HISTORY — DX: Heart failure, unspecified: I50.9

## 2011-05-25 HISTORY — DX: End stage renal disease: N18.6

## 2011-05-25 HISTORY — DX: Adverse effect of unspecified anesthetic, initial encounter: T41.45XA

## 2011-05-25 HISTORY — DX: Shortness of breath: R06.02

## 2011-05-25 HISTORY — DX: Pneumonia, unspecified organism: J18.9

## 2011-05-25 HISTORY — DX: Epilepsy, unspecified, not intractable, without status epilepticus: G40.909

## 2011-05-25 HISTORY — DX: Unspecified kidney failure: N19

## 2011-05-25 HISTORY — DX: Gastric ulcer, unspecified as acute or chronic, without hemorrhage or perforation: K25.9

## 2011-05-25 HISTORY — DX: Dependence on renal dialysis: Z99.2

## 2011-05-25 HISTORY — DX: Constipation, unspecified: K59.00

## 2011-05-25 HISTORY — DX: Dependence on renal dialysis: N18.6

## 2011-05-25 HISTORY — DX: Reserved for inherently not codable concepts without codable children: IMO0001

## 2011-05-25 HISTORY — DX: Other complications of anesthesia, initial encounter: T88.59XA

## 2011-05-25 HISTORY — DX: Angina pectoris, unspecified: I20.9

## 2011-05-25 HISTORY — DX: Anemia, unspecified: D64.9

## 2011-05-25 HISTORY — DX: Encounter for other specified aftercare: Z51.89

## 2011-05-25 LAB — POCT I-STAT, CHEM 8
Calcium, Ion: 1.06 mmol/L — ABNORMAL LOW (ref 1.12–1.32)
Glucose, Bld: 99 mg/dL (ref 70–99)
HCT: 34 % — ABNORMAL LOW (ref 36.0–46.0)
Hemoglobin: 11.6 g/dL — ABNORMAL LOW (ref 12.0–15.0)

## 2011-05-25 MED ORDER — MUPIROCIN 2 % EX OINT
TOPICAL_OINTMENT | Freq: Two times a day (BID) | CUTANEOUS | Status: DC
  Administered 2011-05-26 (×2): via TOPICAL
  Filled 2011-05-25: qty 22

## 2011-05-25 MED ORDER — BISACODYL 10 MG RE SUPP: 10.0000 mg | RECTAL | Status: DC | PRN

## 2011-05-25 MED ORDER — RENA-VITE PO TABS
1.0000 | ORAL_TABLET | Freq: Every day | ORAL | Status: DC
  Administered 2011-05-26 (×2): 1 via ORAL
  Filled 2011-05-25 (×2): qty 1

## 2011-05-25 MED ORDER — ZOLPIDEM TARTRATE 5 MG PO TABS: 5.0000 mg | ORAL_TABLET | Freq: Every evening | ORAL | Status: DC | PRN

## 2011-05-25 MED ORDER — NEPRO/CARBSTEADY PO LIQD
237.0000 mL | Freq: Three times a day (TID) | ORAL | Status: DC | PRN
Start: 2011-05-25 — End: 2011-05-26

## 2011-05-25 MED ORDER — HYDROCODONE-ACETAMINOPHEN 5-325 MG PO TABS
2.0000 | ORAL_TABLET | Freq: Once | ORAL | Status: DC
  Filled 2011-05-25: qty 2

## 2011-05-25 MED ORDER — LIDOCAINE-PRILOCAINE 2.5-2.5 % EX CREA
1.0000 "application " | TOPICAL_CREAM | CUTANEOUS | Status: DC | PRN
  Filled 2011-05-25: qty 5

## 2011-05-25 MED ORDER — NEPRO/CARBSTEADY PO LIQD: 237.0000 mL | ORAL | Status: DC | PRN

## 2011-05-25 MED ORDER — CAMPHOR-MENTHOL 0.5-0.5 % EX LOTN
1.0000 "application " | TOPICAL_LOTION | Freq: Three times a day (TID) | CUTANEOUS | Status: DC | PRN
  Filled 2011-05-25: qty 222

## 2011-05-25 MED ORDER — ONDANSETRON HCL 4 MG PO TABS: 4.0000 mg | ORAL_TABLET | Freq: Three times a day (TID) | ORAL | Status: DC | PRN

## 2011-05-25 MED ORDER — FLUTICASONE-SALMETEROL 250-50 MCG/DOSE IN AEPB
1.0000 | INHALATION_SPRAY | Freq: Two times a day (BID) | RESPIRATORY_TRACT | Status: DC
  Administered 2011-05-26: 1 via RESPIRATORY_TRACT
  Filled 2011-05-25: qty 14

## 2011-05-25 MED ORDER — SODIUM CHLORIDE 0.9 % IJ SOLN: 3.0000 mL | INTRAMUSCULAR | Status: DC | PRN

## 2011-05-25 MED ORDER — SORBITOL 70 % SOLN: 30.0000 mL | Status: DC | PRN

## 2011-05-25 MED ORDER — ALBUTEROL SULFATE (5 MG/ML) 0.5% IN NEBU
2.5000 mg | INHALATION_SOLUTION | Freq: Four times a day (QID) | RESPIRATORY_TRACT | Status: DC
Start: 2011-05-25 — End: 2011-05-25

## 2011-05-25 MED ORDER — PRIMIDONE 250 MG PO TABS
750.0000 mg | ORAL_TABLET | Freq: Two times a day (BID) | ORAL | Status: DC
  Administered 2011-05-26 (×2): 750 mg via ORAL
  Filled 2011-05-25 (×3): qty 3

## 2011-05-25 MED ORDER — MIDODRINE HCL 5 MG PO TABS
10.0000 mg | ORAL_TABLET | Freq: Three times a day (TID) | ORAL | Status: DC
  Administered 2011-05-26 (×2): 10 mg via ORAL
  Filled 2011-05-25 (×5): qty 2

## 2011-05-25 MED ORDER — PROMETHAZINE HCL 25 MG RE SUPP: 25.0000 mg | Freq: Two times a day (BID) | RECTAL | Status: DC | PRN

## 2011-05-25 MED ORDER — DARBEPOETIN ALFA-POLYSORBATE 40 MCG/0.4ML IJ SOLN
40.0000 ug | INTRAMUSCULAR | Status: DC
  Administered 2011-05-25: 40 ug via INTRAVENOUS
  Filled 2011-05-25: qty 0.4

## 2011-05-25 MED ORDER — SODIUM CHLORIDE 0.9 % IJ SOLN
3.0000 mL | Freq: Two times a day (BID) | INTRAMUSCULAR | Status: DC
  Administered 2011-05-25 – 2011-05-26 (×2): 3 mL via INTRAVENOUS

## 2011-05-25 MED ORDER — SODIUM CHLORIDE 0.9 % IV SOLN: INTRAVENOUS | Status: DC

## 2011-05-25 MED ORDER — HYDROCORTISONE ACE-PRAMOXINE 2.5-1 % RE CREA
TOPICAL_CREAM | Freq: Two times a day (BID) | RECTAL | Status: DC
  Administered 2011-05-26 (×2): via RECTAL
  Filled 2011-05-25: qty 30

## 2011-05-25 MED ORDER — HYDROXYZINE HCL 25 MG PO TABS: 25.0000 mg | ORAL_TABLET | Freq: Three times a day (TID) | ORAL | Status: DC | PRN

## 2011-05-25 MED ORDER — LIDOCAINE-PRILOCAINE 2.5-2.5 % EX CREA: 1.0000 "application " | TOPICAL_CREAM | CUTANEOUS | Status: DC | PRN

## 2011-05-25 MED ORDER — CALCIUM CARBONATE 1250 MG/5ML PO SUSP: 500.0000 mg | Freq: Four times a day (QID) | ORAL | Status: DC | PRN

## 2011-05-25 MED ORDER — PENTAFLUOROPROP-TETRAFLUOROETH EX AERO
1.0000 "application " | INHALATION_SPRAY | CUTANEOUS | Status: DC | PRN
  Filled 2011-05-25: qty 103.5

## 2011-05-25 MED ORDER — PANTOPRAZOLE SODIUM 40 MG PO TBEC
40.0000 mg | DELAYED_RELEASE_TABLET | Freq: Every day | ORAL | Status: DC
  Administered 2011-05-26 (×2): 40 mg via ORAL
  Filled 2011-05-25 (×2): qty 1

## 2011-05-25 MED ORDER — SODIUM CHLORIDE 0.9 % IV SOLN: 100.0000 mL | INTRAVENOUS | Status: DC | PRN

## 2011-05-25 MED ORDER — DULOXETINE HCL 30 MG PO CPEP
30.0000 mg | ORAL_CAPSULE | Freq: Two times a day (BID) | ORAL | Status: DC
  Administered 2011-05-26 (×2): 30 mg via ORAL
  Filled 2011-05-25 (×3): qty 1

## 2011-05-25 MED ORDER — LORATADINE 10 MG PO TABS
10.0000 mg | ORAL_TABLET | Freq: Every day | ORAL | Status: DC
  Administered 2011-05-26 (×2): 10 mg via ORAL
  Filled 2011-05-25 (×2): qty 1

## 2011-05-25 MED ORDER — PENTAFLUOROPROP-TETRAFLUOROETH EX AERO: 1.0000 "application " | INHALATION_SPRAY | CUTANEOUS | Status: DC | PRN

## 2011-05-25 MED ORDER — CELECOXIB 200 MG PO CAPS
200.0000 mg | ORAL_CAPSULE | Freq: Two times a day (BID) | ORAL | Status: DC | PRN
  Filled 2011-05-25: qty 1

## 2011-05-25 MED ORDER — ACETAMINOPHEN 650 MG RE SUPP: 650.0000 mg | Freq: Four times a day (QID) | RECTAL | Status: DC | PRN

## 2011-05-25 MED ORDER — CYCLOBENZAPRINE HCL 10 MG PO TABS
10.0000 mg | ORAL_TABLET | Freq: Two times a day (BID) | ORAL | Status: DC
  Administered 2011-05-26 (×2): 10 mg via ORAL
  Filled 2011-05-25 (×3): qty 1

## 2011-05-25 MED ORDER — PROMETHAZINE HCL 25 MG PO TABS: 12.5000 mg | ORAL_TABLET | Freq: Four times a day (QID) | ORAL | Status: DC | PRN

## 2011-05-25 MED ORDER — PNEUMOCOCCAL VAC POLYVALENT 25 MCG/0.5ML IJ INJ
0.5000 mL | INJECTION | INTRAMUSCULAR | Status: AC
  Administered 2011-05-26: 0.5 mL via INTRAMUSCULAR
  Filled 2011-05-25: qty 0.5

## 2011-05-25 MED ORDER — ACETAMINOPHEN 325 MG PO TABS: 650.0000 mg | ORAL_TABLET | Freq: Four times a day (QID) | ORAL | Status: DC | PRN

## 2011-05-25 MED ORDER — CALCIUM ACETATE 667 MG PO CAPS
667.0000 mg | ORAL_CAPSULE | Freq: Three times a day (TID) | ORAL | Status: DC
  Administered 2011-05-26 (×2): 667 mg via ORAL
  Filled 2011-05-25 (×5): qty 1

## 2011-05-25 MED ORDER — HEPARIN SODIUM (PORCINE) 1000 UNIT/ML DIALYSIS
1000.0000 [IU] | INTRAMUSCULAR | Status: DC | PRN
  Filled 2011-05-25: qty 1

## 2011-05-25 MED ORDER — LIDOCAINE HCL (PF) 1 % IJ SOLN
5.0000 mL | INTRAMUSCULAR | Status: DC | PRN
  Filled 2011-05-25: qty 5

## 2011-05-25 MED ORDER — TOPIRAMATE 100 MG PO TABS
200.0000 mg | ORAL_TABLET | Freq: Two times a day (BID) | ORAL | Status: DC
  Administered 2011-05-26 (×2): 200 mg via ORAL
  Filled 2011-05-25 (×3): qty 2

## 2011-05-25 MED ORDER — CYCLOBENZAPRINE HCL 10 MG PO TABS: 10.0000 mg | ORAL_TABLET | Freq: Two times a day (BID) | ORAL | Status: DC

## 2011-05-25 MED ORDER — POLYSACCHARIDE IRON 150 MG PO CAPS
150.0000 mg | ORAL_CAPSULE | Freq: Every day | ORAL | Status: DC
  Administered 2011-05-26 (×2): 150 mg via ORAL
  Filled 2011-05-25 (×2): qty 1

## 2011-05-25 MED ORDER — FLUTICASONE PROPIONATE 50 MCG/ACT NA SUSP
2.0000 | Freq: Every day | NASAL | Status: DC
  Administered 2011-05-26 (×2): 2 via NASAL
  Filled 2011-05-25: qty 16

## 2011-05-25 MED ORDER — HEPARIN SODIUM (PORCINE) 1000 UNIT/ML DIALYSIS: 1000.0000 [IU] | INTRAMUSCULAR | Status: DC | PRN

## 2011-05-25 MED ORDER — LIDOCAINE HCL (PF) 1 % IJ SOLN: 5.0000 mL | INTRAMUSCULAR | Status: DC | PRN

## 2011-05-25 MED ORDER — SEVELAMER HCL 800 MG PO TABS
800.0000 mg | ORAL_TABLET | Freq: Three times a day (TID) | ORAL | Status: DC
  Administered 2011-05-26 (×2): 800 mg via ORAL
  Filled 2011-05-25 (×5): qty 1

## 2011-05-25 MED ORDER — ALBUTEROL SULFATE (5 MG/ML) 0.5% IN NEBU
2.5000 mg | INHALATION_SOLUTION | Freq: Four times a day (QID) | RESPIRATORY_TRACT | Status: DC
  Filled 2011-05-25 (×2): qty 0.5

## 2011-05-25 MED ORDER — DOCUSATE SODIUM 283 MG RE ENEM: 1.0000 | ENEMA | RECTAL | Status: DC | PRN

## 2011-05-25 NOTE — Progress Notes (Signed)
Pt see at HD.  Will do without heparin due to recent fall. Wt 136.1kg. Will pull to DW 132kg.

## 2011-05-25 NOTE — H&P (Signed)
PCP:  Carrie Mew, MD   DOA:  05/25/2011  7:18 AM  Chief Complaint:  Mechanical fall with injury to face and scalp  HPI: 55 y/o morbidly obese female with hx of ESRD on HD ( M,W,F) , OSA on home CPAP, seizure dx, mild mental retardation, depression, HTN, Osteoarthritis, GERD was sent from HD after she had a mechanical fall after tripping while trying to take her shoes off. She hit her face and sustained injury to the nose and anterior scalp. Patient had a transient LOC and after waking up was confused for few hours even in the ED. Head CT and maxillofacial CT done in ED without any obvious injury. Maxillofacial CT comments on ? Ant maxillary condyle dislocation but clinically non evident. She denies any dizziness, presyncopal symptoms, chest pain, palpitation, headache, blurring of vision, tinnitus, or worsening shortness of breath. denies any fever, chills, abdominal pain, Nausea or vomiting. She was seen in the ED 4 days back for seizure activity. Informs being compliant with her meds.   Allergies: Allergies  Allergen Reactions  . Iodinated Diagnostic Agents Hives  . Methylprednisolone Hives    Prior to Admission medications   Medication Sig Start Date End Date Taking? Authorizing Provider  b complex-vitamin c-folic acid (NEPHRO-VITE) 0.8 MG TABS Take 0.8 mg by mouth at bedtime.     Yes Historical Provider, MD  bisacodyl (DULCOLAX) 10 MG suppository Place 10 mg rectally as needed. For constipation   Yes Historical Provider, MD  calcium acetate (PHOSLO) 667 MG capsule Take 667 mg by mouth. 2 with meals    Three times a day 1-2 extra with snacks    Yes Historical Provider, MD  celecoxib (CELEBREX) 200 MG capsule Take 200 mg by mouth 2 (two) times daily as needed. For pain   Yes Historical Provider, MD  clonazePAM (KLONOPIN) 0.5 MG tablet Take 0.5 mg by mouth. One on days of dialysis    Yes Historical Provider, MD  cyclobenzaprine (FLEXERIL) 10 MG tablet Take 10 mg by mouth 2 (two)  times daily.     Yes Historical Provider, MD  DULoxetine (CYMBALTA) 30 MG capsule Take 30 mg by mouth 2 (two) times daily.    Yes Historical Provider, MD  Emollient (MIMYX) CREA Apply topically. Apply to legs at bedtime    Yes Historical Provider, MD  esomeprazole (NEXIUM) 40 MG capsule Take 40 mg by mouth daily before breakfast.     Yes Historical Provider, MD  fexofenadine (ALLEGRA) 180 MG tablet Take 180 mg by mouth daily as needed. For allergies   Yes Historical Provider, MD  fluticasone (FLONASE) 50 MCG/ACT nasal spray Place 2 sprays into the nose daily.     Yes Historical Provider, MD  Fluticasone-Salmeterol (ADVAIR DISKUS) 250-50 MCG/DOSE AEPB Inhale 1 puff into the lungs every 12 (twelve) hours.     Yes Historical Provider, MD  hydrocortisone-pramoxine [facility]) 2.5-1 % rectal cream Place rectally 2 (two) times daily.     Yes Historical Provider, MD  iron polysaccharides (NIFEREX) 150 MG capsule Take 150 mg by mouth. Alternates with ferri-seque every day    Yes Historical Provider, MD  ketoconazole (NIZORAL) 2 % cream Apply topically 3 (three) times daily as needed. For rash   Yes Historical Provider, MD  midodrine (PROAMATINE) 10 MG tablet Take 10 mg by mouth 3 (three) times daily.     Yes Historical Provider, MD  mupirocin (BACTROBAN) 2 % ointment Apply topically 2 (two) times daily.     Yes Historical Provider, MD  ondansetron (ZOFRAN) 4 MG tablet Take 4 mg by mouth every 8 (eight) hours as needed. For nausea   Yes Historical Provider, MD  primidone (MYSOLINE) 250 MG tablet Take 750 mg by mouth 2 (two) times daily.     Yes Historical Provider, MD  sevelamer (RENAGEL) 800 MG tablet Take 800 mg by mouth 3 (three) times daily with meals.     Yes Historical Provider, MD  topiramate (TOPAMAX) 200 MG tablet Take 200 mg by mouth 2 (two) times daily.     Yes Historical Provider, MD    Past Medical History  Diagnosis Date  . Diabetes mellitus not on meds   . ESRD on HD  M,W,F   . Mental  retardation ( mild)   . Osteoarthritis   . Sleep apnea on CPAP   . End-stage renal disease on hemodialysis   . Tuberous sclerosis   . Obesity ( morbid)   . Seizure disorder  follows Dr Thad Ranger ( last episode 4 days back)   . GERD (gastroesophageal reflux disease)   . Depression   .    .        . Hx MRSA infection   .      Past Surgical History  Procedure Date  . Shunt in arm for dialysis   . Uterine tumor   . Nephrectomy     Social History:  reports that she has never smoked. . She reports that she does not drink alcohol or use illicit drugs.lives in [facility].  Family History  Problem Relation Age of Onset  . Diabetes Mother   . Cancer Mother     Review of Systems:  Constitutional: Denies fever, chills, diaphoresis, appetite change , c/o fatigue  HEENT: Denies photophobia, eye pain, redness, hearing loss, ear pain, congestion, sore throat, rhinorrhea, sneezing, mouth sores, trouble swallowing, neck pain, neck stiffness and tinnitus.   Respiratory: informs SOB on exertion with asthma symptoms and wheezing, denies  cough, chest tightness, Cardiovascular: Denies chest pain, palpitations and leg swelling.  Gastrointestinal: Denies nausea, vomiting, abdominal pain, diarrhea, constipation, blood in stool and abdominal distention.  Genitourinary: denies making urine.  Musculoskeletal: Denies myalgias, back pain, joint swelling, arthralgias and gait problem.  Skin: Denies pallor, new  rash .  Neurological: Denies dizziness,infomrs having  Seizures 4 days back,denies  syncope, weakness, light-headedness, numbness and headaches. feels weak  And some pain over frontal scalp and nose Hematological: Denies adenopathy. Easy bruising, personal or family bleeding history  Psychiatric/Behavioral: Denies suicidal ideation, mood changes, had confusion for few hours after the fall, nervousness, sleep disturbance and agitation   Physical Exam:  Filed Vitals:   05/25/11 0836  05/25/11 1026 05/25/11 1159 05/25/11 1341  BP: 146/83 143/85 123/60 129/90  Pulse: 75 81 71 71  Temp: 97.4 F (36.3 C) 97.4 F (36.3 C) 98.7 F (37.1 C) 97.9 F (36.6 C)  TempSrc: Oral Oral Oral Oral  Resp: 15 15 11 16   SpO2: 99% 100% 100% 99%    Constitutional: Vital signs reviewed.  Patient is a moderately obese female in NAD HEENT: small area of swelling over ant scalp, swelling over nose with bruising, no pallor, no icterus, moist oral mucosa, pupils reactive b/l, EOMI , no discomfort on moving the jaw or opening the mouth. No neck rigidity or swelling  Cardiovascular: RRR, S1 normal, S2 normal, no MRG,  Pulmonary/Chest: diminished breath sounds due to body habitus Abdominal: Soft. Non-tender, non-distended, bowel sounds are normal, no masses, organomegaly, or guarding  present.  Musculoskeletal: No joint deformities, erythema, or stiffness, ROM full and  nontender Ext: no edema and no cyanosis,rt UE AV graft Neurological: A&O x3, Strenght is normal and symmetric bilaterally, cranial nerve II-XII are grossly intact, no focal motor deficit,  Psychiatric: Normal mood and affect. speech and behavior is normal. Labs on Admission:  Results for orders placed during the hospital encounter of 05/25/11 (from the past 48 hour(s))  POCT I-STAT, CHEM 8     Status: Abnormal   Collection Time   05/25/11  8:44 AM      Component Value Range Comment   Sodium 134 (*) 135 - 145 (mEq/L)    Potassium 5.3 (*) 3.5 - 5.1 (mEq/L)    Chloride 102  96 - 112 (mEq/L)    BUN 47 (*) 6 - 23 (mg/dL)    Creatinine, Ser 0.45 (*) 0.50 - 1.10 (mg/dL)    Glucose, Bld 99  70 - 99 (mg/dL)    Calcium, Ion 4.09 (*) 1.12 - 1.32 (mmol/L)    TCO2 27  0 - 100 (mmol/L)    Hemoglobin 11.6 (*) 12.0 - 15.0 (g/dL)    HCT 81.1 (*) 91.4 - 46.0 (%)     Radiological Exams on Admission: Head CT : No intracranial abnormality, no fracture  CT maxillofacial:  ? Ant maxillary condyle dislocation which may be due to positioning.  clinial correlation recommended   EKG:  NSR, 1st degree AV block, RBBB, old changes Assessment/Plan 67 female with hx of ESRD on HD, morbid obesity, asthma, OSA on CPAP and multiple co morbidities as listed above presented to the ED following mechanical fall while on HD. Patient fell and hit her ant scalp face and nose with transient LOC and clouding of  Thoughts. No injury noted on imaging. Pt being admitted for overnight observation.  PLAN:  fall with confusion mechanical fall without any underlying seizure, potential CVA or syncopal activity.  patient had injury to scalp and face with transient LOC and confusion for few hours in ED requiring monitoring under observation overnight in the hospital. Head CT unremarkable Maxillofacial CT comments on ? Ant maxillary condyle dislocation but clinically not evident, area non tender and with good ROM of the jaws. Will follow clinically.  fall precautions neurochecks  Repeat Head CT if change in MS  Seizures Cont home meds Sees Dr Thad Ranger as outpt  ESRD on HD  pt did not get her HD as she had a fall. i have spoken to renal dn will arrange for HD today  Asthma  c/o wheezing Start on albuterol nebs , cont advair and proventil  OSA  cont home CPAP  DIET: RENAL  DVT prophylaxis: early ambulation  Dispo: back to assist living if stable overnight  Time Spent on Admission: 25 minutes  Elnor Renovato 05/25/2011, 1:46 PM

## 2011-05-25 NOTE — Consults (Signed)
[name] Renal Consultation  Reason for Consult: ESRD and need for ongoing RRT Referring Physician: Dr. Julaine Fusi is an 55 y.o. female.   HPI: 55 yo White female with ESRD sent to ED today s/p fall with injury to face after attempting to remove her shoes for hemodialysis. No LOC, seen in the ED with CT head negative for acute IC hemorrhage and facial fx's. However, suffered an anterior frontal subcutaneous hematoma and in light of PMH Seizure D/O and admitted for further evaluation and treatment.  Dialyzes at [facility] on MWF since 01/15/06 Primary Nephrologist Dr. Camille Bal. EDW 132 KG. HD Bath 2K/2.25Ca, Dialyzer Optiflux 180, Heparin None. Access RUA Straight HERO AVG, BFR 450, DFR (A1.5), Time 4 hrs  Past Medical History  Diagnosis Date  . Diabetes mellitus   . Renal insufficiency   . Mental retardation   . Osteoarthritis   . Sleep apnea   . End-stage renal disease on hemodialysis   . Tuberous sclerosis   . Obesity   . Seizures   . GERD (gastroesophageal reflux disease)   . Depression   . Mental retardation   . Sleep apnea     on c-pap  . Hx MRSA infection   . Obesity     Past Surgical History  Procedure Date  . Shunt in arm for dialysis   . Uterine tumor   . Nephrectomy     Family History  Problem Relation Age of Onset  . Diabetes Mother   . Cancer Mother     Social History:  reports that she has never smoked. She does not have any smokeless tobacco history on file. She reports that she does not drink alcohol or use illicit drugs.  Allergies:  Allergies  Allergen Reactions  . Iodinated Diagnostic Agents Hives  . Methylprednisolone Hives    Medications: Prior to Admission:  (Not in a hospital admission) Zemplar None. Epogen 16,000 units IV q tx, Venofer 50 mg IV qwk  Results for orders placed during the hospital encounter of 05/25/11 (from the past 48 hour(s))  POCT I-STAT, CHEM 8     Status: Abnormal   Collection  Time   05/25/11  8:44 AM      Component Value Range Comment   Sodium 134 (*) 135 - 145 (mEq/L)    Potassium 5.3 (*) 3.5 - 5.1 (mEq/L)    Chloride 102  96 - 112 (mEq/L)    BUN 47 (*) 6 - 23 (mg/dL)    Creatinine, Ser 4.78 (*) 0.50 - 1.10 (mg/dL)    Glucose, Bld 99  70 - 99 (mg/dL)    Calcium, Ion 2.95 (*) 1.12 - 1.32 (mmol/L)    TCO2 27  0 - 100 (mmol/L)    Hemoglobin 11.6 (*) 12.0 - 15.0 (g/dL)    HCT 62.1 (*) 30.8 - 46.0 (%)     Dg Chest 2 View  05/25/2011  *RADIOLOGY REPORT*  Clinical Data: Shortness of breath.  CHEST - 2 VIEW  Comparison: 05/03/2011  Findings: The cardiopericardial silhouette is enlarged. Interstitial markings are diffusely coarsened with chronic features.  Right sided HeRO dialysis graft noted. Imaged bony structures of the thorax are intact.  IMPRESSION: Cardiomegaly with chronic interstitial coarsening.  Vascular congestion with possible mild interstitial pulmonary edema.  Original Report Authenticated By: ERIC A. MANSELL, M.D.   Ct Head Wo Contrast  05/25/2011  *RADIOLOGY REPORT*  Clinical Data:  Fall.  Denies loss of consciousness.  CT HEAD WITHOUT CONTRAST CT  MAXILLOFACIAL WITHOUT CONTRAST  Technique:  Multidetector CT imaging of the head and maxillofacial structures were performed using the standard protocol without intravenous contrast. Multiplanar CT image reconstructions of the maxillofacial structures were also generated.  Comparison:  11/03/2010 CT and 05/30/2006.  CT HEAD  Findings: Anterior frontal subcutaneous hematoma without underlying skull fracture or intracranial hemorrhage.  No CT evidence of large acute infarct.  Small acute infarct cannot be excluded by CT.  No intracranial mass lesion detected on this unenhanced exam.  Hyperostosis frontalis interna.  Minimal mucosal thickening right sphenoid sinus air cell without air fluid level to indicate underlying skull fracture.  Remainder of visualized sinuses and mastoid air cells are clear.  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  IMPRESSION: No skull fracture or intracranial hemorrhage.  Please see above.  CT MAXILLOFACIAL  Findings:   No facial fracture.  Anterior subluxation mandibular condyle may be related to the jaw positioning with subluxation more notable on the right.  Clinical correlation recommended.  Fusion C2-3.  Limited imaging of the upper cervical spine without obvious fracture.  IMPRESSION: No facial fracture.  Anterior subluxation mandibular condyle may be related to the jaw positioning with subluxation more notable on the right.  Clinical correlation recommended.  Original Report Authenticated By: Fuller Canada, M.D.   Ct Maxillofacial Wo Cm  05/25/2011  *RADIOLOGY REPORT*  Clinical Data:  Fall.  Denies loss of consciousness.  CT HEAD WITHOUT CONTRAST CT MAXILLOFACIAL WITHOUT CONTRAST  Technique:  Multidetector CT imaging of the head and maxillofacial structures were performed using the standard protocol without intravenous contrast. Multiplanar CT image reconstructions of the maxillofacial structures were also generated.  Comparison:  11/03/2010 CT and 05/30/2006.  CT HEAD  Findings: Anterior frontal subcutaneous hematoma without underlying skull fracture or intracranial hemorrhage.  No CT evidence of large acute infarct.  Small acute infarct cannot be excluded by CT.  No intracranial mass lesion detected on this unenhanced exam.  Hyperostosis frontalis interna.  Minimal mucosal thickening right sphenoid sinus air cell without air fluid level to indicate underlying skull fracture.  Remainder of visualized sinuses and mastoid air cells are clear.  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  IMPRESSION: No skull fracture or intracranial hemorrhage.  Please see above.  CT MAXILLOFACIAL  Findings:   No facial fracture.  Anterior subluxation  mandibular condyle may be related to the jaw positioning with subluxation more notable on the right.  Clinical correlation recommended.  Fusion C2-3.  Limited imaging of the upper cervical spine without obvious fracture.  IMPRESSION: No facial fracture.  Anterior subluxation mandibular condyle may be related to the jaw positioning with subluxation more notable on the right.  Clinical correlation recommended.  Original Report Authenticated By: Fuller Canada, M.D.    Review of Systems - Negative except evidence acute facial injury including redness to anterior forehead and nose.  Blood pressure 140/80, pulse 71, temperature 97.9 F (36.6 C), temperature source Oral, resp. rate 16, SpO2 99.00%.  Physical Exam: General: Vital signs reviewed and noted.   Lungs:  Normal respiratory effort. Clear to auscultation BL without crackles or wheezes  Heart: RRR.   Abdomen:  Obese, with BS normoactive. Soft, Nondistended, non-tender.      Extremities: No pretibial edema.   Dialysis Access: RUA straight HERO AVG, +Stone/B Skin: multiple old healing skin abrasions w/o signs infection. Multiple anterior neck skin tags. Scant old  dried up blood left nare and left lower lip.  Assessment/Plan: 1 S/P Fall with anterior frontal subq hematoma- no LOC and at baseline mental status. No heparin with HD.  2 ESRD: MWF outpatient. Missed HD and will schedule HD today 3 Hypertension: Chronic low BP's with HD tx's. Continue Midodrine 10mg  PreHD Stone'x and follow 4. Anemia of ESRD:Continue same outpatient Epo and Iron and follow trend 5. Metabolic Bone Disease: same outpatient binders (Phoslo and Renvela with meals/snacks) and follow ca/phos. No Zemplar with iPTH 123.7 on 05/09/11 6. Seizure D/O- continue same meds and follow   Samuel Germany, [facility] Pager [phone] 05/25/2011, 2:40 PM   I have seen and examined this patient and agree with plan. No new CO.  At her baseline mentally. Will Plan HD  tonight. Maybe home in AM if no new SXS> Reise Gladney Stone,[name] 05/25/2011 4:08 PM

## 2011-05-25 NOTE — Telephone Encounter (Signed)
FYI pt at [facility] due to fall( nosebleed) also pt had seizure on 05-21-2011

## 2011-05-25 NOTE — ED Notes (Signed)
Received pt from ED 8.  Pt presents with facial pain after tripping and falling forward while at HD this morning.  Pt reports she did not received any treatment.  Pt reports she was taking her shoes off, and stumbled forward, hitting face on side of desk.  +LOC.

## 2011-05-25 NOTE — ED Notes (Signed)
Standing at counter top, then fell. No loc. C/o of pain in forehead. Nose bleed that is now controlled. Vss. Stable mid face.

## 2011-05-25 NOTE — Telephone Encounter (Signed)
Dr jenkins informed 

## 2011-05-25 NOTE — ED Notes (Signed)
Held norco b/c pt. Is sleeping.

## 2011-05-25 NOTE — ED Provider Notes (Signed)
History     CSN: [id number] Arrival date & time: 05/25/2011  7:18 AM   First MD Initiated Contact with Patient 05/25/11 0719      Chief Complaint  Patient presents with  . Fall    (Consider location/radiation/quality/duration/timing/severity/associated sxs/prior treatment) HPI Comments: Patient was at dialysis this morning and had a mechanical fall. She states that she tripped while getting onto the scale. She hit her head and face against a countertop. She denies any loss of consciousness, nausea or vomiting. She denies any other injuries in her head and her face. She denies any chest pain, abdominal pain, back pain, neck pain, shortness of breath, dizziness or lightheadedness. She did not receive dialysis today. Her last dialysis was Wednesday. She seen in the ED 4 days ago for seizure with known seizure disorder. She states that she had some bleeding from her nose but that has resolved. she has bruising to her forehead and lips. No weakness, numbness, tingling, bowel or bladder incontinence. She is not on any aspirin or Coumadin.  No change to chronic breathing problems.  The history is provided by the patient and the EMS personnel.    Past Medical History  Diagnosis Date  . Diabetes mellitus   . Renal insufficiency   . Mental retardation   . Osteoarthritis   . Sleep apnea   . End-stage renal disease on hemodialysis   . Tuberous sclerosis   . Obesity   . Seizures   . GERD (gastroesophageal reflux disease)   . Depression   . Mental retardation   . Sleep apnea     on c-pap  . Hx MRSA infection   . Obesity     Past Surgical History  Procedure Date  . Shunt in arm for dialysis   . Uterine tumor   . Nephrectomy     Family History  Problem Relation Age of Onset  . Diabetes Mother   . Cancer Mother     History  Substance Use Topics  . Smoking status: Never Smoker   . Smokeless tobacco: Not on file  . Alcohol Use: No    OB History    Grav Para Term Preterm  Abortions TAB SAB Ect Mult Living                  Review of Systems  Constitutional: Negative for fever, activity change and appetite change.  HENT: Positive for nosebleeds and facial swelling. Negative for neck pain and neck stiffness.   Respiratory: Negative for cough, chest tightness and shortness of breath.   Cardiovascular: Negative for chest pain.  Gastrointestinal: Negative for nausea, vomiting and abdominal pain.  Genitourinary: Negative for dysuria and hematuria.  Musculoskeletal: Negative for back pain.  Neurological: Positive for headaches. Negative for dizziness and light-headedness.    Allergies  Iodinated diagnostic agents and Methylprednisolone  Home Medications   No current outpatient prescriptions on file.  BP 165/81  Pulse 82  Temp(Src) 97.6 F (36.4 C) (Oral)  Resp 18  Ht 5\' 7"  (1.702 m)  Wt 300 lb 0.7 oz (136.1 kg)  BMI 46.99 kg/m2  SpO2 94%  Physical Exam  Constitutional: She is oriented to person, place, and time. She appears well-developed and well-nourished. No distress.  HENT:  Head: Normocephalic.  Right Ear: External ear normal.  Left Ear: External ear normal.  Mouth/Throat: Oropharynx is clear and moist. No oropharyngeal exudate.       Dried blood to bilateral nares, no active bleeding, no septal hematoma no hemotympanum  Ecchymosis and swelling to lower lip, no frenulum tear. No malocclusion no trismus Hematoma to forehead  Eyes: Conjunctivae and EOM are normal. Pupils are equal, round, and reactive to light.  Neck: Normal range of motion. Neck supple.       C-spine pain Step-off or deformity  Cardiovascular: Normal rate, regular rhythm and normal heart sounds.   Pulmonary/Chest: Effort normal and breath sounds normal. No respiratory distress.  Abdominal: Soft. There is no tenderness. There is no rebound and no guarding.  Musculoskeletal: Normal range of motion. She exhibits no edema and no tenderness.       No T. or L-spine  tenderness RUE graft with thrill and bruit  Neurological: She is alert and oriented to person, place, and time. No cranial nerve deficit.       No focal deficits  Skin: Skin is warm.  Psychiatric: She has a normal mood and affect.    ED Course  Procedures (including critical care time)  Labs Reviewed  POCT I-STAT, CHEM 8 - Abnormal; Notable for the following:    Sodium 134 (*)    Potassium 5.3 (*)    BUN 47 (*)    Creatinine, Ser 7.70 (*)    Calcium, Ion 1.06 (*)    Hemoglobin 11.6 (*)    HCT 34.0 (*)    All other components within normal limits  GLUCOSE, CAPILLARY  I-STAT, CHEM 8  POCT CBG MONITORING  MRSA PCR SCREENING  BASIC METABOLIC PANEL   Dg Chest 2 View  05/25/2011  *RADIOLOGY REPORT*  Clinical Data: Shortness of breath.  CHEST - 2 VIEW  Comparison: 05/03/2011  Findings: The cardiopericardial silhouette is enlarged. Interstitial markings are diffusely coarsened with chronic features.  Right sided HeRO dialysis graft noted. Imaged bony structures of the thorax are intact.  IMPRESSION: Cardiomegaly with chronic interstitial coarsening.  Vascular congestion with possible mild interstitial pulmonary edema.  Original Report Authenticated By: ERIC A. MANSELL, M.D.   Ct Head Wo Contrast  05/25/2011  *RADIOLOGY REPORT*  Clinical Data:  Fall.  Denies loss of consciousness.  CT HEAD WITHOUT CONTRAST CT MAXILLOFACIAL WITHOUT CONTRAST  Technique:  Multidetector CT imaging of the head and maxillofacial structures were performed using the standard protocol without intravenous contrast. Multiplanar CT image reconstructions of the maxillofacial structures were also generated.  Comparison:  11/03/2010 CT and 05/30/2006.  CT HEAD  Findings: Anterior frontal subcutaneous hematoma without underlying skull fracture or intracranial hemorrhage.  No CT evidence of large acute infarct.  Small acute infarct cannot be excluded by CT.  No intracranial mass lesion detected on this unenhanced exam.   Hyperostosis frontalis interna.  Minimal mucosal thickening right sphenoid sinus air cell without air fluid level to indicate underlying skull fracture.  Remainder of visualized sinuses and mastoid air cells are clear.  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  IMPRESSION: No skull fracture or intracranial hemorrhage.  Please see above.  CT MAXILLOFACIAL  Findings:   No facial fracture.  Anterior subluxation mandibular condyle may be related to the jaw positioning with subluxation more notable on the right.  Clinical correlation recommended.  Fusion C2-3.  Limited imaging of the upper cervical spine without obvious fracture.  IMPRESSION: No facial fracture.  Anterior subluxation mandibular condyle may be related to the jaw positioning with subluxation more notable on the right.  Clinical correlation recommended.  Original Report Authenticated By: Fuller Canada, M.D.   Ct Maxillofacial Wo Cm  05/25/2011  *  RADIOLOGY REPORT*  Clinical Data:  Fall.  Denies loss of consciousness.  CT HEAD WITHOUT CONTRAST CT MAXILLOFACIAL WITHOUT CONTRAST  Technique:  Multidetector CT imaging of the head and maxillofacial structures were performed using the standard protocol without intravenous contrast. Multiplanar CT image reconstructions of the maxillofacial structures were also generated.  Comparison:  11/03/2010 CT and 05/30/2006.  CT HEAD  Findings: Anterior frontal subcutaneous hematoma without underlying skull fracture or intracranial hemorrhage.  No CT evidence of large acute infarct.  Small acute infarct cannot be excluded by CT.  No intracranial mass lesion detected on this unenhanced exam.  Hyperostosis frontalis interna.  Minimal mucosal thickening right sphenoid sinus air cell without air fluid level to indicate underlying skull fracture.  Remainder of visualized sinuses and mastoid air cells are clear.  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  IMPRESSION: No skull fracture or intracranial hemorrhage.  Please see above.  CT MAXILLOFACIAL  Findings:   No facial fracture.  Anterior subluxation mandibular condyle may be related to the jaw positioning with subluxation more notable on the right.  Clinical correlation recommended.  Fusion C2-3.  Limited imaging of the upper cervical spine without obvious fracture.  IMPRESSION: No facial fracture.  Anterior subluxation mandibular condyle may be related to the jaw positioning with subluxation more notable on the right.  Clinical correlation recommended.  Original Report Authenticated By: Fuller Canada, M.D.     1. Fall   2. Contusion   3. Head injury       MDM  Mechanical fall at dialysis center hitting head countertop. She has a hematoma and bruising to her lips. Neurologically intact.  Will check CT head, CT face and electrolytes.    Date: 05/25/2011  Rate: 75  Rhythm: normal sinus rhythm  QRS Axis: left  Intervals: QT prolonged  ST/T Wave abnormalities: nonspecific ST/T changes  Conduction Disutrbances:nonspecific intraventricular conduction delay  Narrative Interpretation:   Old EKG Reviewed: unchanged  CTs are reviewed and negative for acute injury. Questionable subluxation on mandible is not appreciated on clinical exam. Patient able to open and close her mouth fully.  Patient continues to be somnolent and difficult to arouse. ABG was checked and no evidence of hypercapnia a normal pH. Patient is able to respond to questions verbally with arousal but quickly falls back asleep. She also complains that she can't stay awake. She is due for dialysis today. As she is not back to her baseline has a history of seizures I it is appropriate to watch her in the hospital for observation.     Glynn Octave, MD 05/25/11 [phone]

## 2011-05-25 NOTE — ED Notes (Signed)
Pt.very sleepy at this time.

## 2011-05-26 LAB — BASIC METABOLIC PANEL
BUN: 21 mg/dL (ref 6–23)
Calcium: 9.9 mg/dL (ref 8.4–10.5)
Creatinine, Ser: 4.35 mg/dL — ABNORMAL HIGH (ref 0.50–1.10)
GFR calc non Af Amer: 11 mL/min — ABNORMAL LOW (ref >90)
Glucose, Bld: 89 mg/dL (ref 70–99)

## 2011-05-26 MED ORDER — NEPRO/CARBSTEADY PO LIQD: 237.0000 mL | Freq: Three times a day (TID) | ORAL | Status: DC

## 2011-05-26 MED ORDER — CALCIUM ACETATE 667 MG PO CAPS: 667.0000 mg | ORAL_CAPSULE | Freq: Three times a day (TID) | ORAL | Status: DC

## 2011-05-26 MED ORDER — ALBUTEROL SULFATE (5 MG/ML) 0.5% IN NEBU: 2.5000 mg | INHALATION_SOLUTION | RESPIRATORY_TRACT | Status: DC | PRN

## 2011-05-26 NOTE — Progress Notes (Signed)
Patient being discharged to Assisted Living at present time. Patient alert and oriented in no acute distress. Discharge instructions reviewed with patient and given to EMS personnel.

## 2011-05-26 NOTE — Progress Notes (Signed)
INITIAL ADULT NUTRITION ASSESSMENT Date: 05/26/2011   Time: 10:34 AM Reason for Assessment: weight loss  ASSESSMENT: Female 55 y.o.  Dx: Fall with injury Patient Active Problem List  Diagnoses  . TINEA CORPORIS  . NEVUS, ATYPICAL  . HYPERKALEMIA  . OBESITY, MORBID  . DEPRESSION, CHRONIC, SEVERE  . MENTAL RETARDATION  . SLEEP APNEA, OBSTRUCTIVE  . 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 External thrombosed hemorrhoids  . ALLERGIC RHINITIS, CHRONIC  . ASTHMA  . LUNG NODULE  . GERD  . CONSTIPATION, SLOW TRANSIT  . CONSTIPATION, RECURRENT  . RENAL DISEASE, END STAGE  . CONTACT DERMATITIS&OTHER ECZEMA DUE DETERGENTS  . SKIN TAG  . GEN OSTEOARTHROSIS INVOLVING MULTIPLE SITES  . Tuberous sclerosis  . SHORTNESS OF BREATH (SOB)  . Fall with injury   Hx:  Past Medical History  Diagnosis Date  . Renal insufficiency   . Mental retardation   . Sleep apnea   . End-stage renal disease on hemodialysis   . Tuberous sclerosis   . Obesity   . GERD (gastroesophageal reflux disease)   . Depression   . Mental retardation   . Sleep apnea     on c-pap  . Hx MRSA infection   . Obesity   . Complication of anesthesia     "sometimes I don't wake up on time; depends on how much med"  . Asthma   . 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 Osteoarthritis   . Angina   . CHF (congestive heart failure)   . Shortness of breath     "all the time  . Pneumonia ? 2006  . Diabetes mellitus 05/25/11    denies this history  . Renal failure   . ESRD (end stage renal disease) on dialysis 05/25/11    "monday;wed;fri; last time was 05/25/11; [address]  . Blood transfusion   . Anemia   . Stomach ulcer   . Constipated   . Seizures     last seizure 05/21/11   Related Meds:     . calcium acetate  667 mg Oral TID WC  . cyclobenzaprine  10 mg Oral BID  . DULoxetine  30 mg Oral BID  . fluticasone  2 spray Each Nare Daily  . Fluticasone-Salmeterol  1 puff Inhalation Q12H  .  HYDROcodone-acetaminophen  2 tablet Oral Once  . hydrocortisone-pramoxine   Rectal BID  . loratadine  10 mg Oral Daily  . midodrine  10 mg Oral TID  . multivitamin  1 tablet Oral Daily  . mupirocin   Topical BID  . pantoprazole  40 mg Oral Daily  . pneumococcal 23 valent vaccine  0.5 mL Intramuscular Tomorrow-1000  . polysaccharide iron  150 mg Oral Daily  . primidone  750 mg Oral BID  . sevelamer  800 mg Oral TID WC  . sodium chloride  3 mL Intravenous Q12H  . topiramate  200 mg Oral BID  . DISCONTD: sodium chloride   Intravenous STAT  . DISCONTD: albuterol  2.5 mg Nebulization Q6H  . DISCONTD: albuterol  2.5 mg Nebulization Q6H  . DISCONTD: cyclobenzaprine  10 mg Oral BID  . DISCONTD: darbepoetin  40 mcg Intravenous Q7 days    Ht: 5\' 7"  (170.2 cm)  Wt: 300 lb 0.7 oz (136.1 kg)  Ideal Wt: 61.6 kg  % Ideal Wt: 220%  Usual Wt: pt unaware of amt of weight loss % Usual Wt: unknown  Body mass index is 46.99 kg/(m^2).  Food/Nutrition Related Hx: Pt  unable to stay awake to answer questions despite repeated efforts to awaken RN reports that Pt ate a good breakfast, all of tray  Pt indicates she would like Nepro ordered Labs: Results for [name], [name] (MRN [id number]) as of 05/26/2011 10:38  Ref. Range 05/26/2011 06:04  Sodium Latest Range: 135-145 mEq/L 138  Potassium Latest Range: 3.5-5.1 mEq/L 4.6  Chloride Latest Range: 96-112 mEq/L 97  CO2 Latest Range: 19-32 mEq/L 30  BUN Latest Range: 6-23 mg/dL 21  Creat Latest Range: 0.50-1.10 mg/dL 8.46 (H)  Calcium Latest Range: 8.4-10.5 mg/dL 9.9  GFR calc non Af Amer Latest Range: >90 mL/min 11 (L)  GFR calc Af Amer Latest Range: >90 mL/min 12 (L)  Glucose Latest Range: 70-99 mg/dL 89    Intake: I/O last 3 completed shifts: In: 0  Out: 4629 [Other:4629] Total I/O In: 120 [P.O.:120] Out: -    Output:   Diet Order: Renal  Supplements/Tube Feeding: will order Nepro  IVF:    Estimated Nutritional Needs:   Kcal:  19-2100 Protein: 90-100 Fluid: 2.1 L  NUTRITION DIAGNOSIS: -Impaired nutrient utilization (NI-2.1).r/t ESRD aeb elevated creatinine, need for Dialylsis   Status: Ongoing   MONITORING/EVALUATION(Goals): PO intake that will meet estimated needs  EDUCATION NEEDS: -No education needs identified at this time  INTERVENTION: Renal Diet Nepro  Dietitian #:[id number]  DOCUMENTATION CODES Per approved criteria  -Morbid Obesity    [name],[name] 05/26/2011, 10:34 AM

## 2011-05-26 NOTE — Discharge Summary (Signed)
Patient ID: [name] MRN: [id number] DOB/AGE: January 01, 1956 55 y.o.  Admit date: 05/25/2011 Discharge date: 05/26/2011  Primary Care Physician:  Carrie Mew, MD  Principle Discharge Diagnosis:   Fall due to trip with concussion   Secondary discharge diagnosis:  HYPERKALEMIA  OBESITY, MORBID  DEPRESSION, CHRONIC, SEVERE  MENTAL RETARDATION  SLEEP APNEA, OBSTRUCTIVE  EPILEPSY, W/COMPLEX SEZR W/O INTRCT EPILEPSY  External thrombosed hemorrhoids  ALLERGIC RHINITIS, CHRONIC  ASTHMA  LUNG NODULE  GERD  CONSTIPATION, SLOW TRANSIT  RENAL DISEASE, END STAGE  CONTACT DERMATITIS&OTHER ECZEMA DUE DETERGENTS  GEN OSTEOARTHROSIS INVOLVING MULTIPLE SITES  Tuberous sclerosis   Current Discharge Medication List    CONTINUE these medications which have CHANGED   Details  calcium acetate (PHOSLO) 667 MG capsule Take 1 capsule (667 mg total) by mouth 3 (three) times daily with meals. Qty: 30 capsule, Refills: 0      CONTINUE these medications which have NOT CHANGED   Details  b complex-vitamin c-folic acid (NEPHRO-VITE) 0.8 MG TABS Take 0.8 mg by mouth at bedtime.      bisacodyl (DULCOLAX) 10 MG suppository Place 10 mg rectally as needed. For constipation    celecoxib (CELEBREX) 200 MG capsule Take 200 mg by mouth 2 (two) times daily as needed. For pain    clonazePAM (KLONOPIN) 0.5 MG tablet Take 0.5 mg by mouth. One on days of dialysis     cyclobenzaprine (FLEXERIL) 10 MG tablet Take 10 mg by mouth 2 (two) times daily.      DULoxetine (CYMBALTA) 30 MG capsule Take 30 mg by mouth 2 (two) times daily.     Emollient (MIMYX) CREA Apply topically. Apply to legs at bedtime     esomeprazole (NEXIUM) 40 MG capsule Take 40 mg by mouth daily before breakfast.      fexofenadine (ALLEGRA) 180 MG tablet Take 180 mg by mouth daily as needed. For allergies    fluticasone (FLONASE) 50 MCG/ACT nasal spray Place 2 sprays into the nose daily.      Fluticasone-Salmeterol (ADVAIR  DISKUS) 250-50 MCG/DOSE AEPB Inhale 1 puff into the lungs every 12 (twelve) hours.      hydrocortisone-pramoxine (ANALPRAM-HC) 2.5-1 % rectal cream Place rectally 2 (two) times daily.      iron polysaccharides (NIFEREX) 150 MG capsule Take 150 mg by mouth. Alternates with ferri-seque every day     ketoconazole (NIZORAL) 2 % cream Apply topically 3 (three) times daily as needed. For rash    midodrine (PROAMATINE) 10 MG tablet Take 10 mg by mouth 3 (three) times daily.      mupirocin (BACTROBAN) 2 % ointment Apply topically 2 (two) times daily.      ondansetron (ZOFRAN) 4 MG tablet Take 4 mg by mouth every 8 (eight) hours as needed. For nausea    primidone (MYSOLINE) 250 MG tablet Take 750 mg by mouth 2 (two) times daily.      sevelamer (RENAGEL) 800 MG tablet Take 800 mg by mouth 3 (three) times daily with meals.      topiramate (TOPAMAX) 200 MG tablet Take 200 mg by mouth 2 (two) times daily.          Disposition and Follow-up:  Follow up with PCP in 1-2 weeks  follow up with your scheduled HD  Consults:   NONE  Significant Diagnostic Studies:  Dg Chest 2 View  05/25/2011  *RADIOLOGY REPORT*  Clinical Data: Shortness of breath.  CHEST - 2 VIEW  Comparison: 05/03/2011  Findings: The cardiopericardial silhouette is enlarged.  Interstitial markings are diffusely coarsened with chronic features.  Right sided HeRO dialysis graft noted. Imaged bony structures of the thorax are intact.  IMPRESSION: Cardiomegaly with chronic interstitial coarsening.  Vascular congestion with possible mild interstitial pulmonary edema.  Original Report Authenticated By: ERIC A. MANSELL, M.D.   Ct Head Wo Contrast  05/25/2011  *RADIOLOGY REPORT*  Clinical Data:  Fall.  Denies loss of consciousness.  CT HEAD WITHOUT CONTRAST CT MAXILLOFACIAL WITHOUT CONTRAST  Technique:  Multidetector CT imaging of the head and maxillofacial structures were performed using the standard protocol without intravenous contrast.  Multiplanar CT image reconstructions of the maxillofacial structures were also generated.  Comparison:  11/03/2010 CT and 05/30/2006.  CT HEAD  Findings: Anterior frontal subcutaneous hematoma without underlying skull fracture or intracranial hemorrhage.  No CT evidence of large acute infarct.  Small acute infarct cannot be excluded by CT.  No intracranial mass lesion detected on this unenhanced exam.  Hyperostosis frontalis interna.  Minimal mucosal thickening right sphenoid sinus air cell without air fluid level to indicate underlying skull fracture.  Remainder of visualized sinuses and mastoid air cells are clear.  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  IMPRESSION: No skull fracture or intracranial hemorrhage.  Please see above.  CT MAXILLOFACIAL  Findings:   No facial fracture.  Anterior subluxation mandibular condyle may be related to the jaw positioning with subluxation more notable on the right.  Clinical correlation recommended.  Fusion C2-3.  Limited imaging of the upper cervical spine without obvious fracture.  IMPRESSION: No facial fracture.  Anterior subluxation mandibular condyle may be related to the jaw positioning with subluxation more notable on the right.  Clinical correlation recommended.  Original Report Authenticated By: Fuller Canada, M.D.   Ct Maxillofacial Wo Cm  05/25/2011  *RADIOLOGY REPORT*  Clinical Data:  Fall.  Denies loss of consciousness.  CT HEAD WITHOUT CONTRAST CT MAXILLOFACIAL WITHOUT CONTRAST  Technique:  Multidetector CT imaging of the head and maxillofacial structures were performed using the standard protocol without intravenous contrast. Multiplanar CT image reconstructions of the maxillofacial structures were also generated.  Comparison:  11/03/2010 CT and 05/30/2006.  CT HEAD  Findings: Anterior frontal subcutaneous hematoma without underlying skull fracture or intracranial hemorrhage.   No CT evidence of large acute infarct.  Small acute infarct cannot be excluded by CT.  No intracranial mass lesion detected on this unenhanced exam.  Hyperostosis frontalis interna.  Minimal mucosal thickening right sphenoid sinus air cell without air fluid level to indicate underlying skull fracture.  Remainder of visualized sinuses and mastoid air cells are clear.  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  IMPRESSION: No skull fracture or intracranial hemorrhage.  Please see above.  CT MAXILLOFACIAL  Findings:   No facial fracture.  Anterior subluxation mandibular condyle may be related to the jaw positioning with subluxation more notable on the right.  Clinical correlation recommended.  Fusion C2-3.  Limited imaging of the upper cervical spine without obvious fracture.  IMPRESSION: No facial fracture.  Anterior subluxation mandibular condyle may be related to the jaw positioning with subluxation more notable on the right.  Clinical correlation recommended.  Original Report Authenticated By: Fuller Canada, M.D.    Brief H and P: For complete details please refer to admission H and P, but in brief 55 y/o morbidly obese female with hx of ESRD on HD ( M,W,F) , OSA on  home CPAP, seizure dx, mild mental retardation, depression, HTN, Osteoarthritis, GERD was sent from HD after she had a mechanical fall after tripping while trying to take her shoes off. She hit her face and sustained injury to the nose and anterior scalp. Patient had a transient LOC and after waking up was confused for few hours even in the ED. Head CT and maxillofacial CT done in ED without any obvious injury. Maxillofacial CT comments on ? Ant maxillary condyle dislocation but clinically non evident. She denies any dizziness, presyncopal symptoms, chest pain, palpitation, headache, blurring of vision, tinnitus, or worsening shortness of breath. denies any fever,  chills, abdominal pain, Nausea or vomiting.   Physical Exam on Discharge:  Filed Vitals:   05/26/11 0428 05/26/11 0547 05/26/11 0758 05/26/11 0900  BP:  165/95  166/99  Pulse: 81 73 77 78  Temp:  97.8 F (36.6 C)  97.7 F (36.5 C)  TempSrc:  Axillary  Axillary  Resp: 16 18 22 18   Height:      Weight:      SpO2: 96% 100% 95% 95%   Constitutional: Vital signs reviewed. Patient is a moderately obese female in NAD  HEENT: small area of swelling over ant scalp, swelling over nose with bruising, no pallor, no icterus, moist oral mucosa, pupils reactive b/l, EOMI , no discomfort on moving the jaw or opening the mouth. No neck rigidity or swelling  Cardiovascular: RRR, S1 normal, S2 normal, no MRG,  Pulmonary/Chest: diminished breath sounds due to body habitus  Abdominal: Soft. Non-tender, non-distended, bowel sounds are normal, no masses, organomegaly, or guarding present.  Musculoskeletal: No joint deformities, erythema, or stiffness, ROM full and nontender Ext: no edema and no cyanosis,rt UE AV graft Neurological: A&O x3, Strenght is normal and symmetric bilaterally, cranial nerve II-XII are grossly intact, no focal motor deficit,  Psychiatric: Normal mood and affect. speech and behavior is normal.    Intake/Output Summary (Last 24 hours) at 05/26/11 1001 Last data filed at 05/26/11 0900  Gross per 24 hour  Intake    120 ml  Output   4629 ml  Net  -4509 ml      CBC:    Component Value Date/Time   WBC 4.4 05/22/2011 0038   HGB 11.6* 05/25/2011 0844   HCT 34.0* 05/25/2011 0844   PLT 144* 05/22/2011 0038   MCV 103.6* 05/22/2011 0038   NEUTROABS 3.9 11/03/2010 1903   LYMPHSABS 1.4 11/03/2010 1903   MONOABS 0.2 11/03/2010 1903   EOSABS 0.2 11/03/2010 1903   BASOSABS 0.0 11/03/2010 1903    Basic Metabolic Panel:    Component Value Date/Time   NA 138 05/26/2011 0604   K 4.6 05/26/2011 0604   CL 97 05/26/2011 0604   CO2 30 05/26/2011 0604   BUN 21 05/26/2011 0604   CREATININE  4.35* 05/26/2011 0604   GLUCOSE 89 05/26/2011 0604   CALCIUM 9.9 05/26/2011 0604   CALCIUM 10.2 02/09/2010 1028   *RADIOLOGY REPORT*  Clinical Data: Fall. Denies loss of consciousness.  CT HEAD WITHOUT CONTRAST  CT MAXILLOFACIAL WITHOUT CONTRAST  Technique: Multidetector CT imaging of the head and maxillofacial  structures were performed using the standard protocol without  intravenous contrast. Multiplanar CT image reconstructions of the  maxillofacial structures were also generated.  Comparison: 11/03/2010 CT and 05/30/2006.  CT HEAD  Findings: Anterior frontal subcutaneous hematoma without underlying  skull fracture or intracranial hemorrhage.  No CT evidence of large acute infarct. Small acute infarct cannot  be excluded  by CT.  No intracranial mass lesion detected on this unenhanced exam.  Hyperostosis frontalis interna.  Minimal mucosal thickening right sphenoid sinus air cell without  air fluid level to indicate underlying skull fracture. Remainder  of visualized sinuses and mastoid air cells are clear.  Left frontal calvarial nonspecific lesion possibly representing  fibrous dysplasia unchanged from remote exam from 2007.  IMPRESSION:  No skull fracture or intracranial hemorrhage. Please see above.  CT MAXILLOFACIAL  Findings: No facial fracture.  Anterior subluxation mandibular condyle may be related to the jaw  positioning with subluxation more notable on the right. Clinical  correlation recommended.  Fusion C2-3. Limited imaging of the upper cervical spine without  obvious fracture.  IMPRESSION:  No facial fracture.  Anterior subluxation mandibular condyle may be related to the jaw  positioning with subluxation more notable on the right. Clinical  correlation recommended.   Hospital Course:   fall with concussion Patient mechanical fall without any underlying seizure, potential CVA or syncopal activity. Small hematoma sustained over ant scalp and some bruising  over the bridge of neck without fracture or bleeding. patient had injury to scalp and face with transient LOC and confusion for few hours in ED requiring monitoring under observation overnight in the hospital.  Head CT unremarkable  Maxillofacial CT comments on ? Ant maxillary condyle dislocation but clinically not evident, area non tender and with good ROM of the jaws. Patient had her scheduled HD on 11/9 after admission. Patient's has remained stable under observation overnight without any change in mental status. She can be discharged back to assist living.  she has  Been informed for report to ED and or call her PCP if she develops severe headache, Nausea , vomiting, blurring of vision or confusion.  Time spent on Discharge: 30 MINUTES  Signed: Mills Mitton 05/26/2011, 10:01 AM

## 2011-05-26 NOTE — Progress Notes (Signed)
Subjective: Interval History: has complaints headache, but denies no n/v.  Objective: Vital signs in last 24 hours: Temp:  [97.4 F (36.3 C)-98.7 F (37.1 C)] 97.8 F (36.6 C) (11/10 0547) Pulse Rate:  [71-82] 77  (11/10 0758) Resp:  [11-22] 22  (11/10 0758) BP: (123-165)/(52-95) 165/95 mmHg (11/10 0547) SpO2:  [94 %-100 %] 95 % (11/10 0758) FiO2 (%):  [2 %] 2 % (11/09 1159) Weight:  [136.1 kg (300 lb 0.7 oz)] 300 lb 0.7 oz (136.1 kg) (11/09 1617) Weight change:   Intake/Output from previous day: 11/09 0701 - 11/10 0700 In: 0  Out: 4629  Intake/Output this shift:   General: Comfortable Resp: clear to auscultation bilaterally Cardio: regular rate and rhythm, S1, S2 normal, no murmur, click, rub or gallop GI: soft, non-tender; bowel sounds normal; no masses,  no organomegaly Extremities: extremities normal, atraumatic, no cyanosis or edema Skin: no changes initial assessment Vascular Access: RUA straight HERO AVG  Lab Results:  Basename 05/25/11 0844  WBC --  HGB 11.6*  HCT 34.0*  PLT --   Renal:  Basename 05/26/11 0604 05/25/11 0844  NA 138 134*  K 4.6 5.3*  CL 97 102  CO2 30 --  GLUCOSE 89 99  BUN 21 47*  CREATININE 4.35* 7.70*  CALCIUM 9.9 --  PHOS -- --  ALBUMIN -- --   No results found for this basename: PTH:2 in the last 72 hours  Iron Studies: No results found for this basename: IRON,TIBC,TRANSFERRIN,FERRITIN in the last 72 hours  Studies/Results: Dg Chest 2 View  05/25/2011  *RADIOLOGY REPORT*  Clinical Data: Shortness of breath.  CHEST - 2 VIEW  Comparison: 05/03/2011  Findings: The cardiopericardial silhouette is enlarged. Interstitial markings are diffusely coarsened with chronic features.  Right sided HeRO dialysis graft noted. Imaged bony structures of the thorax are intact.  IMPRESSION: Cardiomegaly with chronic interstitial coarsening.  Vascular congestion with possible mild interstitial pulmonary edema.  Original Report Authenticated By: ERIC A.  MANSELL, M.D.   Ct Head Wo Contrast  05/25/2011  *RADIOLOGY REPORT*  Clinical Data:  Fall.  Denies loss of consciousness.  CT HEAD WITHOUT CONTRAST CT MAXILLOFACIAL WITHOUT CONTRAST  Technique:  Multidetector CT imaging of the head and maxillofacial structures were performed using the standard protocol without intravenous contrast. Multiplanar CT image reconstructions of the maxillofacial structures were also generated.  Comparison:  11/03/2010 CT and 05/30/2006.  CT HEAD  Findings: Anterior frontal subcutaneous hematoma without underlying skull fracture or intracranial hemorrhage.  No CT evidence of large acute infarct.  Small acute infarct cannot be excluded by CT.  No intracranial mass lesion detected on this unenhanced exam.  Hyperostosis frontalis interna.  Minimal mucosal thickening right sphenoid sinus air cell without air fluid level to indicate underlying skull fracture.  Remainder of visualized sinuses and mastoid air cells are clear.  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  IMPRESSION: No skull fracture or intracranial hemorrhage.  Please see above.  CT MAXILLOFACIAL  Findings:   No facial fracture.  Anterior subluxation mandibular condyle may be related to the jaw positioning with subluxation more notable on the right.  Clinical correlation recommended.  Fusion C2-3.  Limited imaging of the upper cervical spine without obvious fracture.  IMPRESSION: No facial fracture.  Anterior subluxation mandibular condyle may be related to the jaw positioning with subluxation more notable on the right.  Clinical correlation recommended.  Original Report Authenticated By: Fuller Canada, M.D.  Ct Maxillofacial Wo Cm  05/25/2011  *RADIOLOGY REPORT*  Clinical Data:  Fall.  Denies loss of consciousness.  CT HEAD WITHOUT CONTRAST CT MAXILLOFACIAL WITHOUT CONTRAST  Technique:  Multidetector CT imaging of the head and maxillofacial  structures were performed using the standard protocol without intravenous contrast. Multiplanar CT image reconstructions of the maxillofacial structures were also generated.  Comparison:  11/03/2010 CT and 05/30/2006.  CT HEAD  Findings: Anterior frontal subcutaneous hematoma without underlying skull fracture or intracranial hemorrhage.  No CT evidence of large acute infarct.  Small acute infarct cannot be excluded by CT.  No intracranial mass lesion detected on this unenhanced exam.  Hyperostosis frontalis interna.  Minimal mucosal thickening right sphenoid sinus air cell without air fluid level to indicate underlying skull fracture.  Remainder of visualized sinuses and mastoid air cells are clear.  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  IMPRESSION: No skull fracture or intracranial hemorrhage.  Please see above.  CT MAXILLOFACIAL  Findings:   No facial fracture.  Anterior subluxation mandibular condyle may be related to the jaw positioning with subluxation more notable on the right.  Clinical correlation recommended.  Fusion C2-3.  Limited imaging of the upper cervical spine without obvious fracture.  IMPRESSION: No facial fracture.  Anterior subluxation mandibular condyle may be related to the jaw positioning with subluxation more notable on the right.  Clinical correlation recommended.  Original Report Authenticated By: Fuller Canada, M.D.    I have reviewed the patient's current medications.  Assessment/Plan:  1. S/P Fall with anterior frontal subq hematoma- remains neuro stable @ baseline. 2. Headache-frontal only, no deficits. prn pain med and follow.  3. ESRD: MWF [facility] with HD last pm.   4. Hypertension: Chronic low with HD, tolerated fluid removal, continue same Midodrine 10mg  PreHD. 5. Anemia of ESRD: hold epo for Hgb >11.5; same outpt Iron.  6. Metabolic Bone Disease: same outpatient binders; no vitamin D.  7.  Seizure D/O- same outpt meds; no activity. 8. Disposition: per primary services. Stable for discharge from renal standpoint.    05/26/2011,8:48 AM  LOS: 1 day   Samuel Germany, FNP-C [facility] Pager [phone] Patient seen and examined, agree with above note Annie Sable

## 2011-05-28 LAB — POCT I-STAT 3, ART BLOOD GAS (G3+)
Acid-Base Excess: 3 mmol/L — ABNORMAL HIGH (ref 0.0–2.0)
Bicarbonate: 29 mEq/L — ABNORMAL HIGH (ref 20.0–24.0)
O2 Saturation: 99 %
pO2, Arterial: 126 mmHg — ABNORMAL HIGH (ref 80.0–100.0)

## 2011-05-28 NOTE — Progress Notes (Signed)
UTILIZATION REVIEW COMPLETE [name] 05/28/2011 [phone] OR [phone]  

## 2011-05-30 ENCOUNTER — Encounter: Payer: Self-pay | Admitting: Internal Medicine

## 2011-05-30 ENCOUNTER — Ambulatory Visit (INDEPENDENT_AMBULATORY_CARE_PROVIDER_SITE_OTHER): Payer: Medicare Other | Admitting: Internal Medicine

## 2011-05-30 VITALS — BP 140/80 | HR 80 | Temp 98.7°F | Resp 18 | Ht 67.0 in | Wt 290.0 lb

## 2011-05-30 DIAGNOSIS — G40209 Localization-related (focal) (partial) symptomatic epilepsy and epileptic syndromes with complex partial seizures, not intractable, without status epilepticus: Secondary | ICD-10-CM

## 2011-05-30 DIAGNOSIS — F332 Major depressive disorder, recurrent severe without psychotic features: Secondary | ICD-10-CM

## 2011-05-30 DIAGNOSIS — Q851 Tuberous sclerosis: Secondary | ICD-10-CM

## 2011-05-30 NOTE — Patient Instructions (Signed)
The patient is instructed to continue all medications as prescribed. Schedule followup with check out clerk upon leaving the clinic  

## 2011-05-30 NOTE — Progress Notes (Signed)
Subjective:    Patient ID: [name], female    DOB: [birth year], 55 y.o.   MRN: [id number]  HPI Pt has been in ER frequently for multiple problems and her last visit was for a seizure. She notes dry nasal passages and states that she has difficulty sleeping with her c-pap. She has hx of Tuberous Sclerosis and probable has a worsening of dz state with poor long term prognosis   Review of Systems  Constitutional: Negative for activity change, appetite change and fatigue.  HENT: Negative for ear pain, congestion, neck pain, postnasal drip and sinus pressure.   Eyes: Negative for redness and visual disturbance.  Respiratory: Negative for cough, shortness of breath and wheezing.   Gastrointestinal: Negative for abdominal pain and abdominal distention.  Genitourinary: Negative for dysuria, frequency and menstrual problem.  Musculoskeletal: Negative for myalgias, joint swelling and arthralgias.  Skin: Negative for rash and wound.  Neurological: Negative for dizziness, weakness and headaches.  Hematological: Negative for adenopathy. Does not bruise/bleed easily.  Psychiatric/Behavioral: Negative for sleep disturbance and decreased concentration.   Past Medical History  Diagnosis Date  . Renal insufficiency   . Mental retardation   . Sleep apnea   . End-stage renal disease on hemodialysis   . Tuberous sclerosis   . Obesity   . GERD (gastroesophageal reflux disease)   . Depression   . Mental retardation   . Sleep apnea     on c-pap  . Hx MRSA infection   . Obesity   . Complication of anesthesia     "sometimes I don't wake up on time; depends on how much med"  . Asthma   . 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 Osteoarthritis   . Angina   . CHF (congestive heart failure)   . Shortness of breath     "all the time  . Pneumonia ? 2006  . Diabetes mellitus 05/25/11    denies this history  . Renal failure   . ESRD (end stage renal disease) on dialysis 05/25/11    "monday;wed;fri; last time was  05/25/11; [address]  . Blood transfusion   . Anemia   . Stomach ulcer   . Constipated   . Seizures     last seizure 05/21/11   Past Surgical History  Procedure Date  . Uterine tumor 05/25/11    pt denies this history  . Nephrectomy 01/08/1994; 01/15/2006    left; right  . Shunt in arm for dialysis     right   . Thrombectomy 05/2010    shunt in my right arm  . Cholecystectomy 07/02/1990  . Tonsillectomy 03/03/1978    reports that she has never smoked. She has never used smokeless tobacco. She reports that she does not drink alcohol or use illicit drugs. family history includes Cancer in her mother and Diabetes in her mother. Allergies  Allergen Reactions  . Iodinated Diagnostic Agents Hives  . Methylprednisolone Hives       Objective:   Physical Exam  Nursing note and vitals reviewed. Constitutional: She is oriented to person, place, and time. She appears well-developed and well-nourished. No distress.       Morbid obesity  HENT:  Head: Normocephalic and atraumatic.  Right Ear: External ear normal.  Left Ear: External ear normal.  Nose: Nose normal.  Mouth/Throat: Oropharynx is clear and moist.  Eyes: Conjunctivae and EOM are normal. Pupils are equal, round, and reactive to light.  Neck: Normal range of motion. Neck supple. No JVD  present. No tracheal deviation present. No thyromegaly present.  Cardiovascular: Normal rate, regular rhythm, normal heart sounds and intact distal pulses.   No murmur heard. Pulmonary/Chest: Effort normal and breath sounds normal. She has no wheezes. She exhibits no tenderness.  Abdominal: Soft. Bowel sounds are normal.  Musculoskeletal: Normal range of motion. She exhibits no edema and no tenderness.  Lymphadenopathy:    She has no cervical adenopathy.  Neurological: She is alert and oriented to person, place, and time. She has normal reflexes. No cranial nerve deficit.  Skin: Skin is warm and dry. She is not diaphoretic.        multiple skin lesions  Psychiatric: She has a normal mood and affect. Her behavior is normal.          Assessment & Plan:  End stage renal on dialysis Tuberous sclerosis with sz possible progression of CNS involvement She has an appointment with the neurologist COPD and OSA  Needs to wear the cpap

## 2011-06-10 ENCOUNTER — Encounter (HOSPITAL_COMMUNITY): Payer: Self-pay

## 2011-06-10 ENCOUNTER — Emergency Department (HOSPITAL_COMMUNITY)
Admission: EM | Admit: 2011-06-10 | Discharge: 2011-06-10 | Disposition: A | Discharge status: Skilled Nursing Facility | Payer: Medicare Other | Attending: Emergency Medicine | Admitting: Emergency Medicine

## 2011-06-10 DIAGNOSIS — E119 Type 2 diabetes mellitus without complications: Secondary | ICD-10-CM | POA: Insufficient documentation

## 2011-06-10 DIAGNOSIS — T82898A Other specified complication of vascular prosthetic devices, implants and grafts, initial encounter: Secondary | ICD-10-CM | POA: Insufficient documentation

## 2011-06-10 DIAGNOSIS — G4730 Sleep apnea, unspecified: Secondary | ICD-10-CM | POA: Insufficient documentation

## 2011-06-10 DIAGNOSIS — Z992 Dependence on renal dialysis: Secondary | ICD-10-CM | POA: Insufficient documentation

## 2011-06-10 DIAGNOSIS — G40909 Epilepsy, unspecified, not intractable, without status epilepticus: Secondary | ICD-10-CM | POA: Insufficient documentation

## 2011-06-10 DIAGNOSIS — R21 Rash and other nonspecific skin eruption: Secondary | ICD-10-CM | POA: Insufficient documentation

## 2011-06-10 DIAGNOSIS — I509 Heart failure, unspecified: Secondary | ICD-10-CM | POA: Insufficient documentation

## 2011-06-10 DIAGNOSIS — N186 End stage renal disease: Secondary | ICD-10-CM | POA: Insufficient documentation

## 2011-06-10 DIAGNOSIS — E669 Obesity, unspecified: Secondary | ICD-10-CM | POA: Insufficient documentation

## 2011-06-10 DIAGNOSIS — F79 Unspecified intellectual disabilities: Secondary | ICD-10-CM | POA: Insufficient documentation

## 2011-06-10 DIAGNOSIS — J45909 Unspecified asthma, uncomplicated: Secondary | ICD-10-CM | POA: Insufficient documentation

## 2011-06-10 DIAGNOSIS — Y841 Kidney dialysis as the cause of abnormal reaction of the patient, or of later complication, without mention of misadventure at the time of the procedure: Secondary | ICD-10-CM | POA: Insufficient documentation

## 2011-06-10 DIAGNOSIS — T829XXA Unspecified complication of cardiac and vascular prosthetic device, implant and graft, initial encounter: Secondary | ICD-10-CM

## 2011-06-10 DIAGNOSIS — K219 Gastro-esophageal reflux disease without esophagitis: Secondary | ICD-10-CM | POA: Insufficient documentation

## 2011-06-10 NOTE — ED Notes (Signed)
Pt presents with some kind of object sticking out of the right upper anterior portion of the right arm. Her dialysis graft is positive for thrill and bruit. Breath sounds are clear and bowel sounds are present. edp at the bedside and vascular surgery has been consulted. Pt will need to follow up with dr. Arbie Cookey on tues or thurs.

## 2011-06-10 NOTE — ED Notes (Signed)
Report called back to [facility]. Bandage supplies and tape provided when returned to facility.

## 2011-06-10 NOTE — ED Notes (Signed)
Pt undressed, in gown, on continuous pulse oximetry and blood pressure cuff; pink armband placed on right arm

## 2011-06-10 NOTE — ED Notes (Signed)
Pt presents via [facility] with complaints of right upper arm rash. She states that there was "some kind of bone fragment" sticking out of her right arm. Pt denies any recent falls or recent diagnosis of humerus fracture. The area appears to look like a fingernail or possibly a tooth. Unable to know exactly what is sticking out of this abscess looking area to her arm.

## 2011-06-10 NOTE — ED Provider Notes (Signed)
History     CSN: [id number] Arrival date & time: 06/10/2011  2:10 PM   First MD Initiated Contact with Patient 06/10/11 1455      Chief Complaint  Patient presents with  . Rash    (Consider location/radiation/quality/duration/timing/severity/associated sxs/prior treatment) Patient is a 55 y.o. female presenting with rash. The history is provided by the patient.  Rash  This is a new problem.   patient states that she has a piece of foam coming out of her right upper arm. He states that a few months ago they were trying to cannulate for dialysis site in hip bone. She states she thinks this is the bone, and out of the skin. She states it just started. No fevers. sHe states it hurts. She is dialyzed at a site medial to the site. No numbness or weakness. No bleeding from the site. no drainage from the site  Past Medical History  Diagnosis Date  . Renal insufficiency   . Mental retardation   . Sleep apnea   . End-stage renal disease on hemodialysis   . Tuberous sclerosis   . Obesity   . GERD (gastroesophageal reflux disease)   . Depression   . Mental retardation   . Sleep apnea     on c-pap  . Hx MRSA infection   . Obesity   . Complication of anesthesia     "sometimes I don't wake up on time; depends on how much med"  . Asthma   . 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 Osteoarthritis   . Angina   . CHF (congestive heart failure)   . Shortness of breath     "all the time  . Pneumonia ? 2006  . Diabetes mellitus 05/25/11    denies this history  . Renal failure   . ESRD (end stage renal disease) on dialysis 05/25/11    "monday;wed;fri; last time was 05/25/11; usually @ [address]  . Blood transfusion   . Anemia   . Stomach ulcer   . Constipated   . Seizures     last seizure 05/21/11    Past Surgical History  Procedure Date  . Uterine tumor 05/25/11    pt denies this history  . Nephrectomy 01/08/1994; 01/15/2006    left; right  . Shunt in arm for dialysis     right   .  Thrombectomy 05/2010    shunt in my right arm  . Cholecystectomy 07/02/1990  . Tonsillectomy 03/03/1978    Family History  Problem Relation Age of Onset  . Diabetes Mother   . Cancer Mother     History  Substance Use Topics  . Smoking status: Never Smoker   . Smokeless tobacco: Never Used  . Alcohol Use: No    OB History    Grav Para Term Preterm Abortions TAB SAB Ect Mult Living                  Review of Systems  Musculoskeletal: Negative for joint swelling.  Skin: Positive for rash and wound. Negative for color change and pallor.  Neurological: Negative for weakness and numbness.    Allergies  Iodinated diagnostic agents; Methylprednisolone; and Other  Home Medications   Current Outpatient Rx  Name Route Sig Dispense Refill  . IPRATROPIUM-ALBUTEROL 18-103 MCG/ACT IN AERO Inhalation Inhale 2 puffs into the lungs every 4 (four) hours as needed. As needed for asthma     . NEPHRO-VITE 0.8 MG PO TABS Oral Take 0.8 mg by mouth at bedtime.      Marland Kitchen  BISACODYL 10 MG RE SUPP Rectal Place 10 mg rectally as needed. For constipation    . CALCIUM ACETATE 667 MG PO CAPS Oral Take 1,334 mg by mouth 3 (three) times daily with meals.      Marland Kitchen CLONAZEPAM 0.5 MG PO TABS Oral Take 0.5 mg by mouth. One on days of dialysis MWF    . CYCLOBENZAPRINE HCL 10 MG PO TABS Oral Take 10 mg by mouth 2 (two) times daily.      . DULOXETINE HCL 30 MG PO CPEP Oral Take 30 mg by mouth 2 (two) times daily.     Marland Kitchen ESOMEPRAZOLE MAGNESIUM 40 MG PO CPDR Oral Take 40 mg by mouth daily before breakfast.      . FEXOFENADINE HCL 180 MG PO TABS Oral Take 180 mg by mouth daily as needed. For allergies    . FLUTICASONE PROPIONATE 50 MCG/ACT NA SUSP Nasal Place 2 sprays into the nose daily.      Marland Kitchen FLUTICASONE-SALMETEROL 250-50 MCG/DOSE IN AEPB Inhalation Inhale 1 puff into the lungs every 12 (twelve) hours.      Marland Kitchen HYDROCORTISONE ACE-PRAMOXINE 2.5-1 % RE CREA Rectal Place rectally 2 (two) times daily.      Marland Kitchen  POLYSACCHARIDE IRON COMPLEX 150 MG PO CAPS Oral Take 150 mg by mouth. Alternates with ferri-seque every day     . KETOCONAZOLE 2 % EX CREA Topical Apply topically 3 (three) times daily as needed. For rash    . MIDODRINE HCL 10 MG PO TABS Oral Take 10 mg by mouth 3 (three) times daily.      Marland Kitchen PRIMIDONE 250 MG PO TABS Oral Take 750 mg by mouth 2 (two) times daily.      Marland Kitchen SEVELAMER HCL 800 MG PO TABS Oral Take 1,600 mg by mouth 3 (three) times daily with meals.     . TOPIRAMATE 200 MG PO TABS Oral Take 200 mg by mouth 2 (two) times daily.      . CELECOXIB 200 MG PO CAPS Oral Take 200 mg by mouth 2 (two) times daily as needed. For pain    . MUPIROCIN 2 % EX OINT Topical Apply topically 2 (two) times daily.      Marland Kitchen ONDANSETRON HCL 4 MG PO TABS Oral Take 4 mg by mouth every 8 (eight) hours as needed. For nausea      BP 127/70  Pulse 72  Temp(Src) 98.2 F (36.8 C) (Oral)  Resp 18  SpO2 100%  Physical Exam  Constitutional: She appears well-developed and well-nourished.  HENT:  Head: Normocephalic.  Musculoskeletal:        Patient has a pulsatile dialysis access on her right upper arm. She has small scabs from previous dialysis at the site. Lateral to this she has what appears to be an old dialysis graft. There is no pulse in his graft. She states she used to be dialyzed up there. There is an approximately 4 mm x 3 mm plastic-appearing material coming out the small wound of the skin. Neurovascularly intact distally. No bleeding from the site no purulent drainage. No fluctuance. Slight traction on the plastic shows a more white plastic. A    ED Course  Procedures (including critical care time)  Labs Reviewed - No data to display No results found.   1. Renal dialysis device, implant, or graft complication       MDM  Patient has what appears to be a piece of old dialysis graft coming out of the wound on her  right upper extremity. It is not appear to be infected. She has a new worse year-old  graft medially with a pulse in it. As where she is dialyzed. I discussed the case with Dr. Edilia Bo, from vascular surgery. He states that she should be followed on an outpatient basis. She'll need to come this week on nondialysis day. She is dialyzed Monday Wednesday Friday. She will call the office on Monday.        Juliet Rude. Rubin Payor, MD 06/10/11 1525

## 2011-06-11 IMAGING — US IR AV DIALYSIS GRAFT DECLOT
1 series · 6 of 6 positions shown · non-contrast
Comparison: none

CLINICAL DATA: End-stage renal disease, occluded right upper arm
Gerardo access

[Series 1: ir av dialysis graft declot · 6 of 6 slices shown]
[im 1/6]
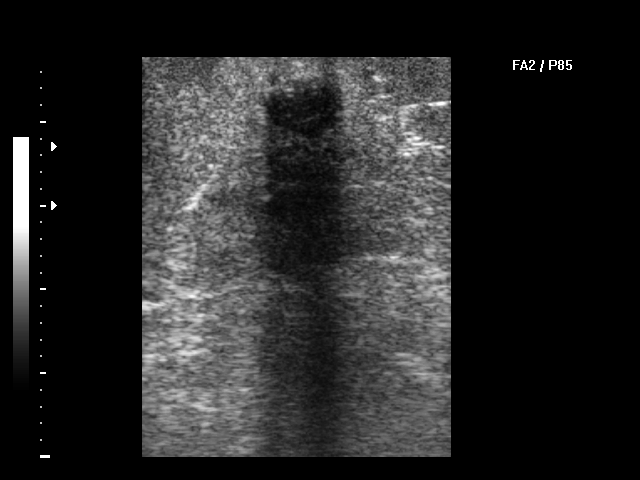
[im 2/6]
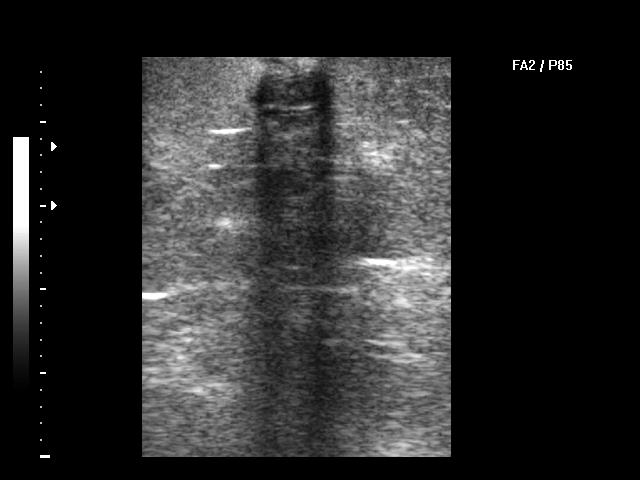
[im 3/6]
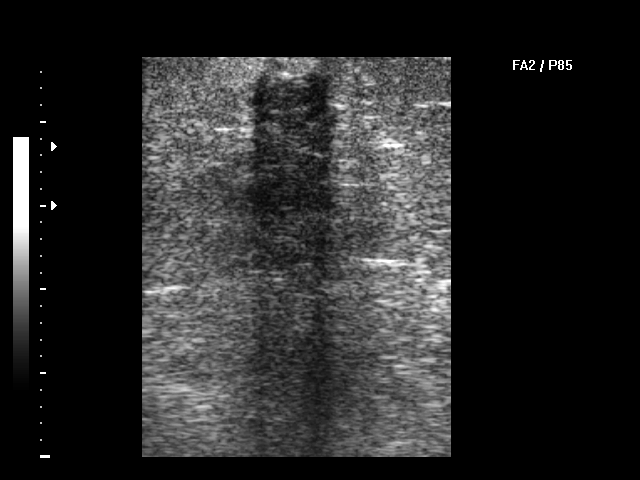
[im 4/6]
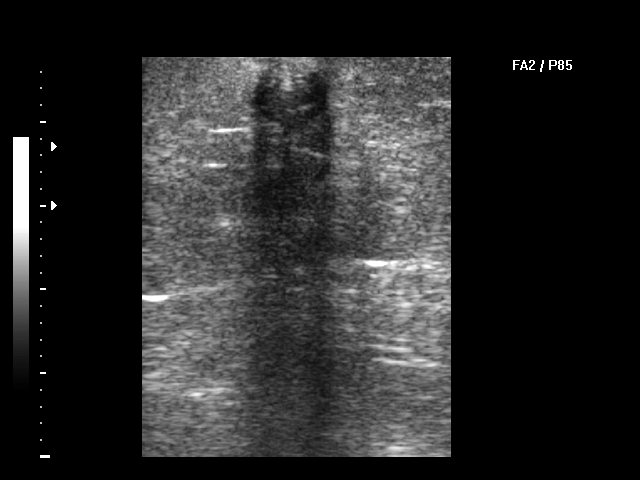
[im 5/6]
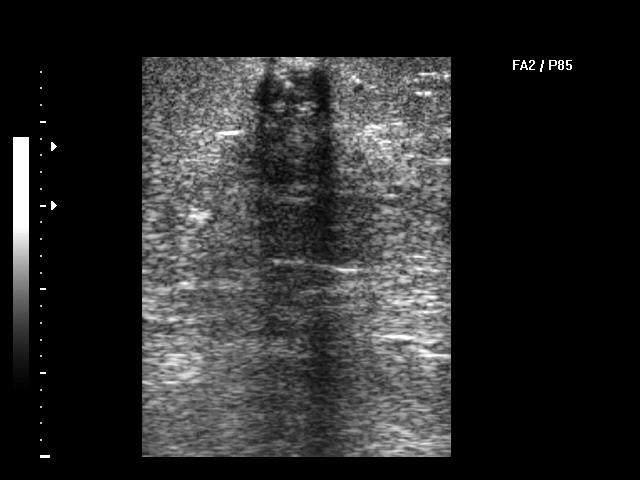
[im 6/6]
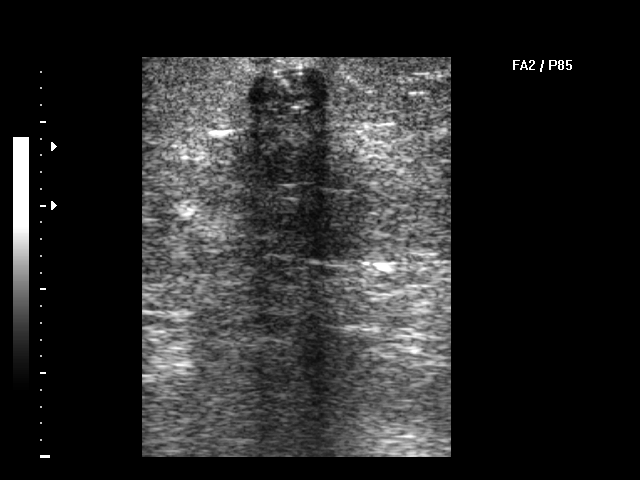

[6 of 6 positions shown; findings below may reference images not displayed]

ULTRASOUND GUIDANCE FOR VASCULAR ACCESS
AV GRAFT ACCESS THROMBOLYSIS/THROMBECTOMY (KENG IP ACCESS)
6 MM VENOUS ANGIOPLASTY

Date:  06/10/2009 [DATE],06/10/2009 [DATE],06/10/2009
[DATE],06/10/2009 [DATE]

Radiologist:  Giovannella Guardo, M.D.

Medications:  100 mg Solu-Medrol, 50 mg Benadryl, 2 mg TPA, 4000
units heparin, 0.5 mg Versed

Guidance:  Ultrasound and fluoroscopic

Fluoroscopy time:  4.4 minutes

Sedation time:  25 minutes

Contrast volume:  50 ml [id number]

Complications:  No immediate

PROCEDURE/FINDINGS:

Informed consent was obtained from the patient following
explanation of the procedure, risks, benefits and alternatives.
The patient understands, agrees and consents for the procedure.
All questions were addressed.  A time out was performed.

Maximal barrier sterile technique utilized including caps, mask,
sterile gowns, sterile gloves, large sterile drape, hand hygiene,
and betadine

Under sterile conditions and local anesthesia, the right upper arm
AV graft was accessed with micropuncture needles under ultrasound.
Images were obtained for documentation.  6-French sheaths were
inserted.  Contrast injection confirms thrombosis of the access.  2
mg TPA instilled for thrombolysis.  Mechanical thrombectomy was
performed with the AngioJet device, total run volume 200 ml
heparinized saline.  Repeat injection demonstrates no significant
large clot burden.  Residual intragraft stenoses were treated with
6 mm overlapping angioplasty.  To reestablish inflow, a 5.5 French
Fogarty catheter over a glide wire was passed across the arterial
anastomosis twice.  This was for Fogarty embolectomy of the
arterial plug.  Both sheaths were back bled and syringe aspirated.
Completion shuntogram performed.

Shuntogram:  Following thrombolysis, thrombectomy and 6 mm
angioplasty, the right upper arm Gerardo access is patent.  There is
brisk graft flow.  No residual intragraft stenosis or thrombus.
Catheter tip is noted to be in the right atrial IVC junction
IMPRESSION: Successful right upper arm Hero graft thrombolysis, thrombectomy
and venous angioplasty to restore flow.  Access ready for use.

## 2011-06-18 ENCOUNTER — Encounter: Payer: Self-pay | Admitting: Vascular Surgery

## 2011-06-19 ENCOUNTER — Ambulatory Visit: Payer: Medicare Other | Admitting: Vascular Surgery

## 2011-06-21 ENCOUNTER — Encounter: Payer: Self-pay | Admitting: Vascular Surgery

## 2011-06-22 ENCOUNTER — Other Ambulatory Visit: Payer: Self-pay | Admitting: *Deleted

## 2011-06-22 ENCOUNTER — Ambulatory Visit (INDEPENDENT_AMBULATORY_CARE_PROVIDER_SITE_OTHER): Payer: Medicare Other | Admitting: Vascular Surgery

## 2011-06-22 ENCOUNTER — Encounter: Payer: Self-pay | Admitting: Vascular Surgery

## 2011-06-22 VITALS — BP 138/73 | HR 77 | Resp 16 | Ht 67.0 in | Wt 292.0 lb

## 2011-06-22 DIAGNOSIS — T82329A Displacement of unspecified vascular grafts, initial encounter: Secondary | ICD-10-CM

## 2011-06-22 DIAGNOSIS — T82598A Other mechanical complication of other cardiac and vascular devices and implants, initial encounter: Secondary | ICD-10-CM

## 2011-06-22 DIAGNOSIS — N186 End stage renal disease: Secondary | ICD-10-CM

## 2011-06-22 NOTE — Progress Notes (Signed)
[facility] [place]  Established Dialysis Access  History of Present Illness  [name] is a 55 y.o. female who presents for evaluation for right arm graft exposure.  This patient has previously had a R arm HeRO placed.  The previous HeRO thrombosed and then she had another placed medial to the old one.  The patient had an area of erosion over the old graft and now graft is evident.  Past Medical History  Diagnosis Date  . Renal insufficiency   . Mental retardation   . Sleep apnea   . End-stage renal disease on hemodialysis   . Tuberous sclerosis   . Obesity   . GERD (gastroesophageal reflux disease)   . Depression   . Mental retardation   . Sleep apnea     on c-pap  . Hx MRSA infection   . Obesity   . Complication of anesthesia     "sometimes I don't wake up on time; depends on how much med"  . Asthma   . 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 Osteoarthritis   . Angina   . CHF (congestive heart failure)   . Shortness of breath     "all the time  . Pneumonia ? 2006  . Diabetes mellitus 05/25/11    denies this history  . Renal failure   . ESRD (end stage renal disease) on dialysis 05/25/11    "monday;wed;fri; last time was 05/25/11; usually @ [address]  . Blood transfusion   . Anemia   . Stomach ulcer   . Constipated   . Seizures     last seizure 05/21/11    Past Surgical History  Procedure Date  . Uterine tumor 05/25/11    pt denies this history  . Nephrectomy 01/08/1994; 01/15/2006    left; right  . Shunt in arm for dialysis     right   . Thrombectomy 05/2010    shunt in my right arm  . Cholecystectomy 07/02/1990  . Tonsillectomy 03/03/1978    History   Social History  . Marital Status: Single    Spouse Name: N/A    Number of Children: N/A  . Years of Education: N/A   Occupational History  . lives in group home    Social History Main Topics  . Smoking status: Never Smoker   . Smokeless tobacco: Never Used  . Alcohol Use: No  . Drug  Use: No  . Sexually Active: Not on file   Other Topics Concern  . Not on file   Social History Narrative  . No narrative on file    Family History  Problem Relation Age of Onset  . Diabetes Father     Current Outpatient Prescriptions on File Prior to Visit  Medication Sig Dispense Refill  . albuterol-ipratropium (COMBIVENT) 18-103 MCG/ACT inhaler Inhale 2 puffs into the lungs every 4 (four) hours as needed. As needed for asthma       . b complex-vitamin c-folic acid (NEPHRO-VITE) 0.8 MG TABS Take 0.8 mg by mouth at bedtime.        . bisacodyl (DULCOLAX) 10 MG suppository Place 10 mg rectally as needed. For constipation      . calcium acetate (PHOSLO) 667 MG capsule Take 1,334 mg by mouth 3 (three) times daily with meals.        . celecoxib (CELEBREX) 200 MG capsule Take 200 mg by mouth 2 (two) times daily as needed. For pain      . clonazePAM (KLONOPIN)   0.5 MG tablet Take 0.5 mg by mouth. One on days of dialysis MWF      . cyclobenzaprine (FLEXERIL) 10 MG tablet Take 10 mg by mouth 2 (two) times daily.        . DULoxetine (CYMBALTA) 30 MG capsule Take 30 mg by mouth 2 (two) times daily.       . esomeprazole (NEXIUM) 40 MG capsule Take 40 mg by mouth daily before breakfast.        . fexofenadine (ALLEGRA) 180 MG tablet Take 180 mg by mouth daily as needed. For allergies      . fluticasone (FLONASE) 50 MCG/ACT nasal spray Place 2 sprays into the nose daily.        . Fluticasone-Salmeterol (ADVAIR DISKUS) 250-50 MCG/DOSE AEPB Inhale 1 puff into the lungs every 12 (twelve) hours.        . hydrocortisone-pramoxine (ANALPRAM-HC) 2.5-1 % rectal cream Place rectally 2 (two) times daily.        . iron polysaccharides (NIFEREX) 150 MG capsule Take 150 mg by mouth. Alternates with ferri-seque every day       . ketoconazole (NIZORAL) 2 % cream Apply topically 3 (three) times daily as needed. For rash      . midodrine (PROAMATINE) 10 MG tablet Take 10 mg by mouth 3 (three) times daily.        .  mupirocin (BACTROBAN) 2 % ointment Apply topically 2 (two) times daily.        . ondansetron (ZOFRAN) 4 MG tablet Take 4 mg by mouth every 8 (eight) hours as needed. For nausea      . primidone (MYSOLINE) 250 MG tablet Take 750 mg by mouth 2 (two) times daily.        . sevelamer (RENAGEL) 800 MG tablet Take 1,600 mg by mouth 3 (three) times daily with meals.       . topiramate (TOPAMAX) 200 MG tablet Take 200 mg by mouth 2 (two) times daily.          Allergies  Allergen Reactions  . Iodinated Diagnostic Agents Hives  . Methylprednisolone Hives  . Other     Arthritis medications    Review of Systems: as above otherwise negative  Physical Examination  Filed Vitals:   06/22/11 1342  BP: 138/73  Pulse: 77  Resp: 16  Height: 5' 7" (1.702 m)  Weight: 292 lb (132.45 kg)  SpO2: 99%   Body mass index is 45.73 kg/(m^2).   General: A&O x 3, WDWN, morbidly obese  Pulmonary: Sym exp, good air movt, CTAB, no rales, rhonchi, & wheezing  Cardiac: RRR, Nl S1, S2, no Murmurs, rubs or gallops  Gastrointestinal: soft, NTND, -G/R, - HSM, - masses, - CVAT B  Musculoskeletal: M/S 5/5 throughout , Extremities without  ischemic changes , multiple healed excoriation on both arms, R arm AVG with thrill and bruit, area of exposed graft adjacent to active graft, no drainage, no bleeding, no erythema  Neurologic: Pain and light touch intact in extremities , Motor exam as listed above  Medical Decision Making  [name] is a 55 y.o. female who presents with exposed graft in R arm adjacent to current functional AVG  Luckily the exposed graft is thrombosed and does not appear to be infected.  There is no obvious cellulitis around the AVG.  Patient is scheduled for excision of portion of old R UA AVG on Tuesday 11 DEC 12.  Meanwhile, she needs to continue antibiotics on hemodialysis.  The   patient is aware that the risks of access surgery include but are not limited to: bleeding, infection,  steal syndrome, nerve damage, ischemic monomelic neuropathy, failure of access to mature, and possible need for additional access procedures in the future.  The patient has agreed to proceed with the above procedure.  Shalana Jardin, MD [facility] [place] Office: [phone] Pager: [phone]  06/22/2011, 2:06 PM   

## 2011-06-25 ENCOUNTER — Encounter (HOSPITAL_COMMUNITY): Payer: Self-pay

## 2011-06-25 ENCOUNTER — Encounter (HOSPITAL_COMMUNITY): Payer: Self-pay | Admitting: Pharmacy Technician

## 2011-06-25 MED ORDER — DEXTROSE 5 % IV SOLN
1.5000 g | INTRAVENOUS | Status: DC
  Filled 2011-06-25: qty 1.5

## 2011-06-26 ENCOUNTER — Ambulatory Visit (HOSPITAL_COMMUNITY): Payer: Medicare Other | Admitting: Anesthesiology

## 2011-06-26 ENCOUNTER — Encounter (HOSPITAL_COMMUNITY): Payer: Self-pay | Admitting: Anesthesiology

## 2011-06-26 ENCOUNTER — Encounter (HOSPITAL_COMMUNITY): Payer: Self-pay | Admitting: *Deleted

## 2011-06-26 ENCOUNTER — Ambulatory Visit (HOSPITAL_COMMUNITY)
Admission: RE | Admit: 2011-06-26 | Discharge: 2011-06-26 | Disposition: A | Discharge status: Home / Self Care | Payer: Medicare Other | Source: Ambulatory Visit | Attending: Vascular Surgery | Admitting: Vascular Surgery

## 2011-06-26 ENCOUNTER — Encounter (HOSPITAL_COMMUNITY)
Admission: RE | Disposition: A | Discharge status: Home / Self Care | Payer: Self-pay | Source: Ambulatory Visit | Attending: Vascular Surgery

## 2011-06-26 DIAGNOSIS — Z992 Dependence on renal dialysis: Secondary | ICD-10-CM | POA: Insufficient documentation

## 2011-06-26 DIAGNOSIS — N186 End stage renal disease: Secondary | ICD-10-CM | POA: Insufficient documentation

## 2011-06-26 DIAGNOSIS — I120 Hypertensive chronic kidney disease with stage 5 chronic kidney disease or end stage renal disease: Secondary | ICD-10-CM | POA: Insufficient documentation

## 2011-06-26 DIAGNOSIS — T827XXA Infection and inflammatory reaction due to other cardiac and vascular devices, implants and grafts, initial encounter: Secondary | ICD-10-CM

## 2011-06-26 DIAGNOSIS — J45909 Unspecified asthma, uncomplicated: Secondary | ICD-10-CM | POA: Insufficient documentation

## 2011-06-26 DIAGNOSIS — G4730 Sleep apnea, unspecified: Secondary | ICD-10-CM | POA: Insufficient documentation

## 2011-06-26 DIAGNOSIS — B999 Unspecified infectious disease: Secondary | ICD-10-CM | POA: Insufficient documentation

## 2011-06-26 DIAGNOSIS — Y849 Medical procedure, unspecified as the cause of abnormal reaction of the patient, or of later complication, without mention of misadventure at the time of the procedure: Secondary | ICD-10-CM | POA: Insufficient documentation

## 2011-06-26 HISTORY — PX: AVGG REMOVAL: SHX5153

## 2011-06-26 LAB — SURGICAL PCR SCREEN: MRSA, PCR: NEGATIVE

## 2011-06-26 LAB — POCT I-STAT 4, (NA,K, GLUC, HGB,HCT)
Glucose, Bld: 87 mg/dL (ref 70–99)
HCT: 32 % — ABNORMAL LOW (ref 36.0–46.0)
Hemoglobin: 10.9 g/dL — ABNORMAL LOW (ref 12.0–15.0)
Potassium: 4.7 mEq/L (ref 3.5–5.1)

## 2011-06-26 LAB — PROTIME-INR: INR: 1.06 (ref 0.00–1.49)

## 2011-06-26 SURGERY — REVISION OF ARTERIOVENOUS GORETEX GRAFT
Anesthesia: Monitor Anesthesia Care | Site: Arm Lower | Laterality: Right | Wound class: Dirty or Infected

## 2011-06-26 MED ORDER — FENTANYL CITRATE 0.05 MG/ML IJ SOLN
INTRAMUSCULAR | Status: DC | PRN
  Administered 2011-06-26: 50 ug via INTRAVENOUS

## 2011-06-26 MED ORDER — SODIUM CHLORIDE 0.9 % IV SOLN: 250.0000 mL | INTRAVENOUS | Status: DC

## 2011-06-26 MED ORDER — PROPOFOL 10 MG/ML IV EMUL
INTRAVENOUS | Status: DC | PRN
  Administered 2011-06-26: 50 ug/kg/min via INTRAVENOUS

## 2011-06-26 MED ORDER — FENTANYL CITRATE 0.05 MG/ML IJ SOLN
25.0000 ug | INTRAMUSCULAR | Status: DC | PRN
  Administered 2011-06-26: 25 ug via INTRAVENOUS

## 2011-06-26 MED ORDER — OXYCODONE HCL 5 MG PO TABS: 5.0000 mg | ORAL_TABLET | ORAL | Status: AC | PRN

## 2011-06-26 MED ORDER — DEXTROSE 5 % IV SOLN
1.5000 g | INTRAVENOUS | Status: DC | PRN
  Administered 2011-06-26: 1.5 g via INTRAVENOUS

## 2011-06-26 MED ORDER — SODIUM CHLORIDE 0.9 % IV SOLN
INTRAVENOUS | Status: DC | PRN
  Administered 2011-06-26: 14:00:00 via INTRAVENOUS

## 2011-06-26 MED ORDER — MIDAZOLAM HCL 5 MG/5ML IJ SOLN
INTRAMUSCULAR | Status: DC | PRN
  Administered 2011-06-26: 2 mg via INTRAVENOUS

## 2011-06-26 MED ORDER — PROMETHAZINE HCL 25 MG/ML IJ SOLN: 6.2500 mg | INTRAMUSCULAR | Status: DC | PRN

## 2011-06-26 MED ORDER — MUPIROCIN 2 % EX OINT
TOPICAL_OINTMENT | Freq: Once | CUTANEOUS | Status: AC
  Administered 2011-06-26: 10:00:00 via NASAL

## 2011-06-26 MED ORDER — SODIUM CHLORIDE 0.9 % IR SOLN
Status: DC | PRN
  Administered 2011-06-26: 1000 mL

## 2011-06-26 MED ORDER — SODIUM CHLORIDE 0.9 % IR SOLN
Status: DC | PRN
  Administered 2011-06-26 (×2)

## 2011-06-26 MED ORDER — SODIUM CHLORIDE 0.9 % IV SOLN: INTRAVENOUS | Status: DC

## 2011-06-26 MED ORDER — MUPIROCIN 2 % EX OINT
TOPICAL_OINTMENT | CUTANEOUS | Status: AC
  Filled 2011-06-26: qty 22

## 2011-06-26 MED ORDER — LIDOCAINE HCL (PF) 1 % IJ SOLN
INTRAMUSCULAR | Status: DC | PRN
  Administered 2011-06-26: 6 mL

## 2011-06-26 SURGICAL SUPPLY — 40 items
ADH SKN CLS APL DERMABOND .7 (GAUZE/BANDAGES/DRESSINGS) ×1
CANISTER SUCTION 2500CC (MISCELLANEOUS) ×2 IMPLANT
CLIP TI MEDIUM 6 (CLIP) ×2 IMPLANT
CLIP TI WIDE RED SMALL 6 (CLIP) ×2 IMPLANT
CLOTH BEACON ORANGE TIMEOUT ST (SAFETY) ×2 IMPLANT
COVER SURGICAL LIGHT HANDLE (MISCELLANEOUS) ×4 IMPLANT
DECANTER SPIKE VIAL GLASS SM (MISCELLANEOUS) ×2 IMPLANT
DERMABOND ADVANCED (GAUZE/BANDAGES/DRESSINGS) ×1
DERMABOND ADVANCED .7 DNX12 (GAUZE/BANDAGES/DRESSINGS) ×1 IMPLANT
DRSG COVADERM 4X10 (GAUZE/BANDAGES/DRESSINGS) ×1 IMPLANT
ELECT REM PT RETURN 9FT ADLT (ELECTROSURGICAL) ×2
ELECTRODE REM PT RTRN 9FT ADLT (ELECTROSURGICAL) ×1 IMPLANT
GAUZE SPONGE 2X2 8PLY STRL LF (GAUZE/BANDAGES/DRESSINGS) ×1 IMPLANT
GEL ULTRASOUND 20GR AQUASONIC (MISCELLANEOUS) IMPLANT
GLOVE BIO SURGEON STRL SZ 6.5 (GLOVE) ×2 IMPLANT
GLOVE BIO SURGEON STRL SZ7.5 (GLOVE) ×2 IMPLANT
GLOVE BIOGEL PI IND STRL 7.0 (GLOVE) IMPLANT
GLOVE BIOGEL PI IND STRL 7.5 (GLOVE) IMPLANT
GLOVE BIOGEL PI INDICATOR 7.0 (GLOVE) ×2
GLOVE BIOGEL PI INDICATOR 7.5 (GLOVE) ×2
GLOVE SURG SS PI 7.5 STRL IVOR (GLOVE) ×2 IMPLANT
GOWN PREVENTION PLUS XLARGE (GOWN DISPOSABLE) ×3 IMPLANT
GOWN STRL NON-REIN LRG LVL3 (GOWN DISPOSABLE) ×3 IMPLANT
KIT BASIN OR (CUSTOM PROCEDURE TRAY) ×2 IMPLANT
KIT ROOM TURNOVER OR (KITS) ×2 IMPLANT
LOOP VESSEL MINI RED (MISCELLANEOUS) IMPLANT
NS IRRIG 1000ML POUR BTL (IV SOLUTION) ×2 IMPLANT
PACK CV ACCESS (CUSTOM PROCEDURE TRAY) ×2 IMPLANT
PAD ARMBOARD 7.5X6 YLW CONV (MISCELLANEOUS) ×4 IMPLANT
SPONGE GAUZE 2X2 STER 10/PKG (GAUZE/BANDAGES/DRESSINGS) ×1
SPONGE SURGIFOAM ABS GEL 100 (HEMOSTASIS) IMPLANT
SUT ETHILON 3 0 PS 1 (SUTURE) ×4 IMPLANT
SUT PROLENE 6 0 CC (SUTURE) ×2 IMPLANT
SUT VIC AB 3-0 SH 27 (SUTURE) ×2
SUT VIC AB 3-0 SH 27X BRD (SUTURE) ×1 IMPLANT
SUT VICRYL 4-0 PS2 18IN ABS (SUTURE) ×2 IMPLANT
TOWEL OR 17X24 6PK STRL BLUE (TOWEL DISPOSABLE) ×2 IMPLANT
TOWEL OR 17X26 10 PK STRL BLUE (TOWEL DISPOSABLE) ×2 IMPLANT
UNDERPAD 30X30 INCONTINENT (UNDERPADS AND DIAPERS) ×2 IMPLANT
WATER STERILE IRR 1000ML POUR (IV SOLUTION) ×2 IMPLANT

## 2011-06-26 NOTE — Interval H&P Note (Signed)
History and Physical Interval Note:  06/26/2011 12:16 PM  Exam:  Exposed non functional right upper arm AV graft  [name]  has presented today for surgery, with the diagnosis of ESRD  The various methods of treatment have been discussed with the patient and family. After consideration of risks, benefits and other options for treatment, the patient has consented to  Procedure(s): REVISION OF ARTERIOVENOUS GORETEX GRAFT as a surgical intervention .  The patients' history has been reviewed, patient examined, no change in status, stable for surgery.  I have reviewed the patients' chart and labs.  Questions were answered to the patient's satisfaction.     FIELDS,CHARLES E

## 2011-06-26 NOTE — H&P (View-Only) (Signed)
[facility] [place]  Established Dialysis Access  History of Present Illness  [name] is a 55 y.o. female who presents for evaluation for right arm graft exposure.  This patient has previously had a R arm HeRO placed.  The previous HeRO thrombosed and then she had another placed medial to the old one.  The patient had an area of erosion over the old graft and now graft is evident.  Past Medical History  Diagnosis Date  . Renal insufficiency   . Mental retardation   . Sleep apnea   . End-stage renal disease on hemodialysis   . Tuberous sclerosis   . Obesity   . GERD (gastroesophageal reflux disease)   . Depression   . Mental retardation   . Sleep apnea     on c-pap  . Hx MRSA infection   . Obesity   . Complication of anesthesia     "sometimes I don't wake up on time; depends on how much med"  . Asthma   . 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 Osteoarthritis   . Angina   . CHF (congestive heart failure)   . Shortness of breath     "all the time  . Pneumonia ? 2006  . Diabetes mellitus 05/25/11    denies this history  . Renal failure   . ESRD (end stage renal disease) on dialysis 05/25/11    "monday;wed;fri; last time was 05/25/11; usually @ [address]  . Blood transfusion   . Anemia   . Stomach ulcer   . Constipated   . Seizures     last seizure 05/21/11    Past Surgical History  Procedure Date  . Uterine tumor 05/25/11    pt denies this history  . Nephrectomy 01/08/1994; 01/15/2006    left; right  . Shunt in arm for dialysis     right   . Thrombectomy 05/2010    shunt in my right arm  . Cholecystectomy 07/02/1990  . Tonsillectomy 03/03/1978    History   Social History  . Marital Status: Single    Spouse Name: N/A    Number of Children: N/A  . Years of Education: N/A   Occupational History  . lives in group home    Social History Main Topics  . Smoking status: Never Smoker   . Smokeless tobacco: Never Used  . Alcohol Use: No  . Drug  Use: No  . Sexually Active: Not on file   Other Topics Concern  . Not on file   Social History Narrative  . No narrative on file    Family History  Problem Relation Age of Onset  . Diabetes Father     Current Outpatient Prescriptions on File Prior to Visit  Medication Sig Dispense Refill  . albuterol-ipratropium (COMBIVENT) 18-103 MCG/ACT inhaler Inhale 2 puffs into the lungs every 4 (four) hours as needed. As needed for asthma       . b complex-vitamin c-folic acid (NEPHRO-VITE) 0.8 MG TABS Take 0.8 mg by mouth at bedtime.        . bisacodyl (DULCOLAX) 10 MG suppository Place 10 mg rectally as needed. For constipation      . calcium acetate (PHOSLO) 667 MG capsule Take 1,334 mg by mouth 3 (three) times daily with meals.        . celecoxib (CELEBREX) 200 MG capsule Take 200 mg by mouth 2 (two) times daily as needed. For pain      . clonazePAM (KLONOPIN)  0.5 MG tablet Take 0.5 mg by mouth. One on days of dialysis MWF      . cyclobenzaprine (FLEXERIL) 10 MG tablet Take 10 mg by mouth 2 (two) times daily.        . DULoxetine (CYMBALTA) 30 MG capsule Take 30 mg by mouth 2 (two) times daily.       Marland Kitchen esomeprazole (NEXIUM) 40 MG capsule Take 40 mg by mouth daily before breakfast.        . fexofenadine (ALLEGRA) 180 MG tablet Take 180 mg by mouth daily as needed. For allergies      . fluticasone (FLONASE) 50 MCG/ACT nasal spray Place 2 sprays into the nose daily.        . Fluticasone-Salmeterol (ADVAIR DISKUS) 250-50 MCG/DOSE AEPB Inhale 1 puff into the lungs every 12 (twelve) hours.        . hydrocortisone-pramoxine (ANALPRAM-HC) 2.5-1 % rectal cream Place rectally 2 (two) times daily.        . iron polysaccharides (NIFEREX) 150 MG capsule Take 150 mg by mouth. Alternates with ferri-seque every day       . ketoconazole (NIZORAL) 2 % cream Apply topically 3 (three) times daily as needed. For rash      . midodrine (PROAMATINE) 10 MG tablet Take 10 mg by mouth 3 (three) times daily.        .  mupirocin (BACTROBAN) 2 % ointment Apply topically 2 (two) times daily.        . ondansetron (ZOFRAN) 4 MG tablet Take 4 mg by mouth every 8 (eight) hours as needed. For nausea      . primidone (MYSOLINE) 250 MG tablet Take 750 mg by mouth 2 (two) times daily.        . sevelamer (RENAGEL) 800 MG tablet Take 1,600 mg by mouth 3 (three) times daily with meals.       . topiramate (TOPAMAX) 200 MG tablet Take 200 mg by mouth 2 (two) times daily.          Allergies  Allergen Reactions  . Iodinated Diagnostic Agents Hives  . Methylprednisolone Hives  . Other     Arthritis medications    Review of Systems: as above otherwise negative  Physical Examination  Filed Vitals:   06/22/11 1342  BP: 138/73  Pulse: 77  Resp: 16  Height: 5\' 7"  (1.702 m)  Weight: 292 lb (132.45 kg)  SpO2: 99%   Body mass index is 45.73 kg/(m^2).   General: A&O x 3, WDWN, morbidly obese  Pulmonary: Sym exp, good air movt, CTAB, no rales, rhonchi, & wheezing  Cardiac: RRR, Nl S1, S2, no Murmurs, rubs or gallops  Gastrointestinal: soft, NTND, -G/R, - HSM, - masses, - CVAT B  Musculoskeletal: M/S 5/5 throughout , Extremities without  ischemic changes , multiple healed excoriation on both arms, R arm AVG with thrill and bruit, area of exposed graft adjacent to active graft, no drainage, no bleeding, no erythema  Neurologic: Pain and light touch intact in extremities , Motor exam as listed above  Medical Decision Making  [name] is a 55 y.o. female who presents with exposed graft in R arm adjacent to current functional AVG  Luckily the exposed graft is thrombosed and does not appear to be infected.  There is no obvious cellulitis around the AVG.  Patient is scheduled for excision of portion of old R UA AVG on Tuesday 11 DEC 12.  Meanwhile, she needs to continue antibiotics on hemodialysis.  The  patient is aware that the risks of access surgery include but are not limited to: bleeding, infection,  steal syndrome, nerve damage, ischemic monomelic neuropathy, failure of access to mature, and possible need for additional access procedures in the future.  The patient has agreed to proceed with the above procedure.  Leonides Sake, MD [facility] [place] Office: [phone] Pager: [phone]  06/22/2011, 2:06 PM

## 2011-06-26 NOTE — Op Note (Signed)
Procedure: Removal right upper arm AV graft  Preoperative diagnosis: Infected right upper arm AV graft  Postoperative diagnosis: Same  Anesthesia: Local with IV sedation  Assistant: Newton Pigg PA-C  Indications: Patient is a 55 year old female who has a nonfunctional disconnected right upper arm graft with exposed graft segment. She presents today for removal of this. Her current graft is more medial and remote from this and not involved.  Operative details: After obtaining informed consent, the patient was taken to the operating room. The patient was placed in supine position on the operating room table. After adequate sedation, the patient's entire right upper extremity was prepped and draped in usual sterile fashion. Local anesthesia was infiltrated over a pre-existing graft on the outer aspect of the right upper arm. Incision was made over the graft and carried into the subcutaneous tissues to the graft. The graft was dissected free circumferentially an approximate 7 cm of graft was removed. The graft had been previously disconnected on both ends. There is no purulent material. There was no abscess. The graft was fairly well incorporated. The wound was thoroughly irrigated with normal saline solution. Hemostasis was obtained. Skin edges were approximated with interrupted 3-0 nylon simple sutures. The patient tolerated procedure well there were no complications. Patient was taken to the recovery room in stable condition.  Fabienne Bruns, MD [facility] [place] Office: [phone] Pager: [phone]

## 2011-06-26 NOTE — Anesthesia Postprocedure Evaluation (Signed)
  Anesthesia Post-op Note  Patient: [name]  Procedure(s) Performed:  REVISION OF ARTERIOVENOUS GORETEX GRAFT - Excision portion of graft  Patient Location: PACU  Anesthesia Type: MAC  Level of Consciousness: awake, alert  and oriented  Airway and Oxygen Therapy: Patient Spontanous Breathing  Post-op Pain: none  Post-op Assessment: Post-op Vital signs reviewed, Patient's Cardiovascular Status Stable, Respiratory Function Stable, Patent Airway, No signs of Nausea or vomiting and Pain level controlled  Post-op Vital Signs: Reviewed and stable  Complications: No apparent anesthesia complications

## 2011-06-26 NOTE — OR Nursing (Signed)
Late entry. 1757 on 06/26/11

## 2011-06-26 NOTE — Transfer of Care (Signed)
Immediate Anesthesia Transfer of Care Note  Patient: [name]  Procedure(s) Performed:  REVISION OF ARTERIOVENOUS GORETEX GRAFT - Excision portion of graft  Patient Location: PACU  Anesthesia Type: MAC  Level of Consciousness: awake, alert , oriented and patient cooperative  Airway & Oxygen Therapy: Patient Spontanous Breathing  Post-op Assessment: Report given to PACU RN  Post vital signs: Reviewed and stable  Complications: No apparent anesthesia complications

## 2011-06-26 NOTE — Anesthesia Preprocedure Evaluation (Addendum)
Anesthesia Evaluation  Patient identified by MRN, date of birth, ID band Patient awake    Reviewed: Allergy & Precautions, H&P , NPO status , Patient's Chart, lab work & pertinent test results  Airway Mallampati: II TM Distance: >3 FB Neck ROM: Full    Dental  (+) Teeth Intact and Dental Advisory Given   Pulmonary asthma (last inhaler use yesterday) , sleep apnea and Continuous Positive Airway Pressure Ventilation , Recent URI , Resolved,  clear to auscultation  Pulmonary exam normal       Cardiovascular hypertension (controlled with dialysis), Regular Normal    Neuro/Psych Seizures -, Well Controlled,  PSYCHIATRIC DISORDERS (developmental delay)    GI/Hepatic GERD-  Medicated and Poorly Controlled,  Endo/Other  Morbid obesity  Renal/GU CRF and DialysisRenal disease (MWF dialysis 4.7)     Musculoskeletal  (+) Arthritis -,   Abdominal (+) obese,   Peds  Hematology   Anesthesia Other Findings   Reproductive/Obstetrics                           Anesthesia Physical Anesthesia Plan  ASA: III  Anesthesia Plan: MAC   Post-op Pain Management:    Induction:   Airway Management Planned: Simple Face Mask  Additional Equipment:   Intra-op Plan:   Post-operative Plan:   Informed Consent: I have reviewed the patients History and Physical, chart, labs and discussed the procedure including the risks, benefits and alternatives for the proposed anesthesia with the patient or authorized representative who has indicated his/her understanding and acceptance.   Dental advisory given  Plan Discussed with: CRNA and Surgeon  Anesthesia Plan Comments: (Plan routine monitors, MAC with local by Dr. Darrick Penna)        Anesthesia Quick Evaluation

## 2011-06-26 NOTE — Preoperative (Signed)
Beta Blockers   Reason not to administer Beta Blockers:Not Applicable 

## 2011-06-28 IMAGING — XA IR AV DIALYSIS GRAFT DECLOT
1 series · 12 of 24 positions shown · non-contrast
Comparison: none

CLINICAL DATA: Occluded right upper arm Plus dialysis graft.

[Series 1: run · 12 of 39 slices shown]
[im 2/39]
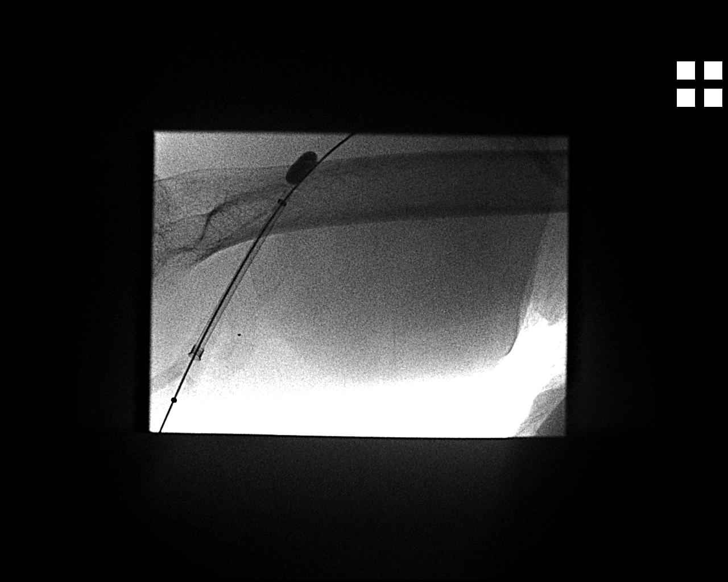
[im 5/39]
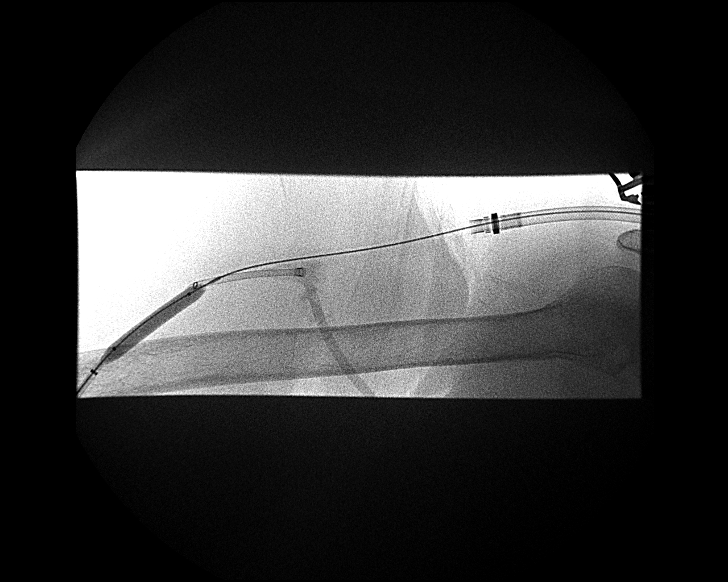
[im 9/39]
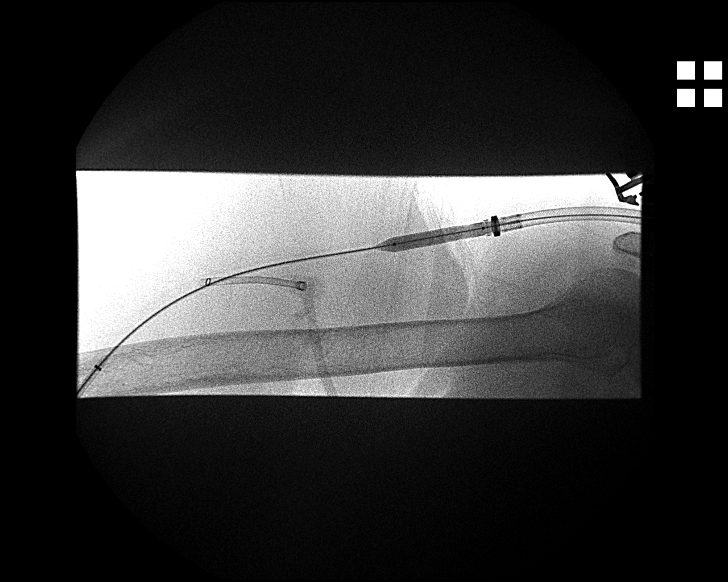
[im 12/39]
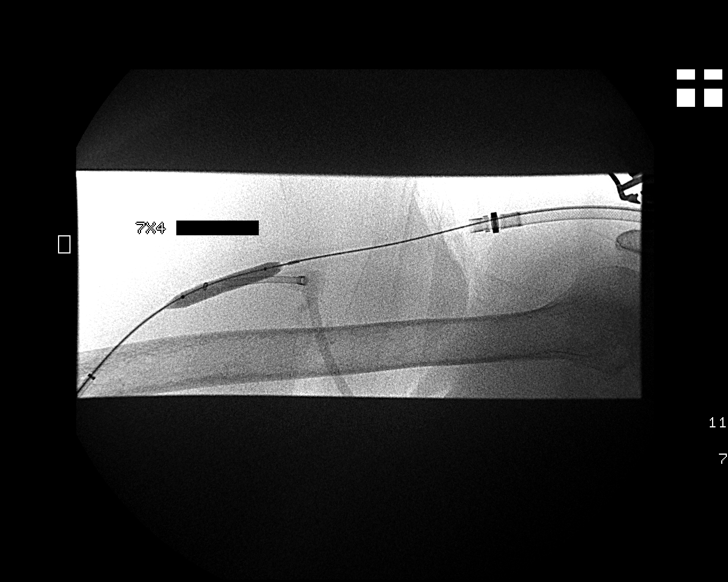
[im 15/39]
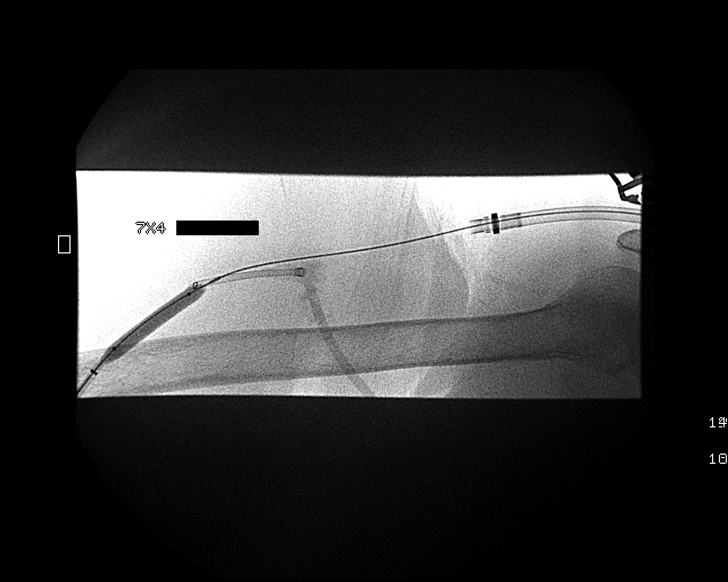
[im 19/39]
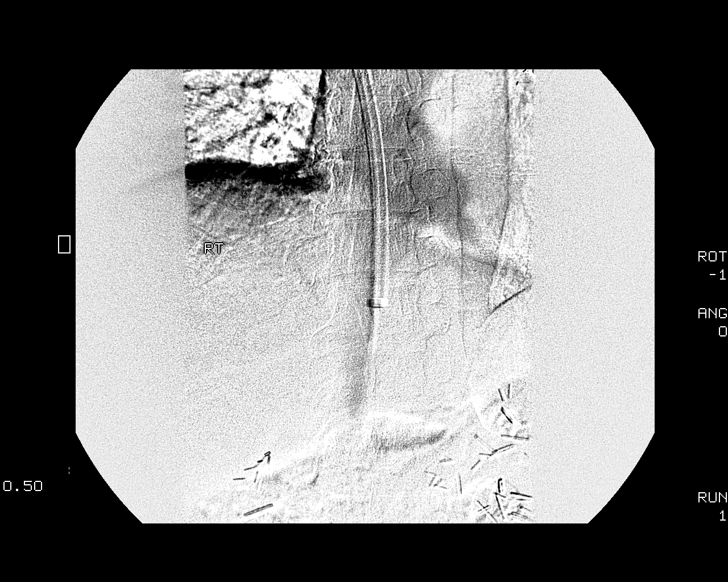
[im 22/39]
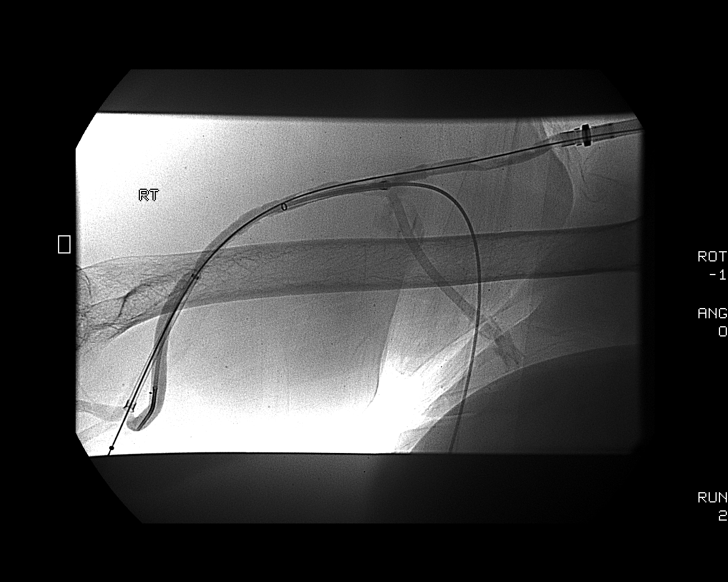
[im 25/39]
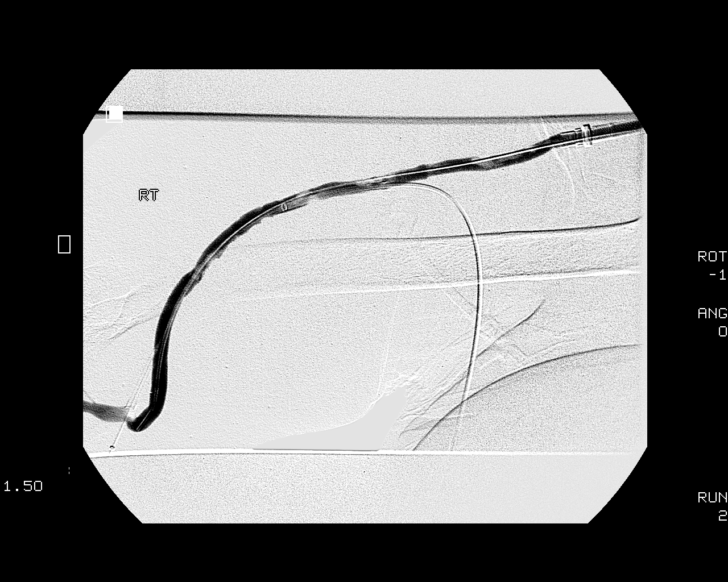
[im 29/39]
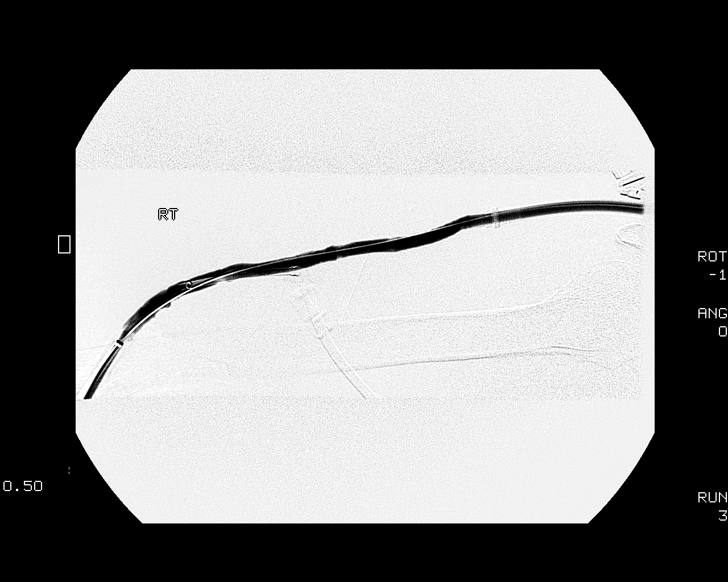
[im 32/39]
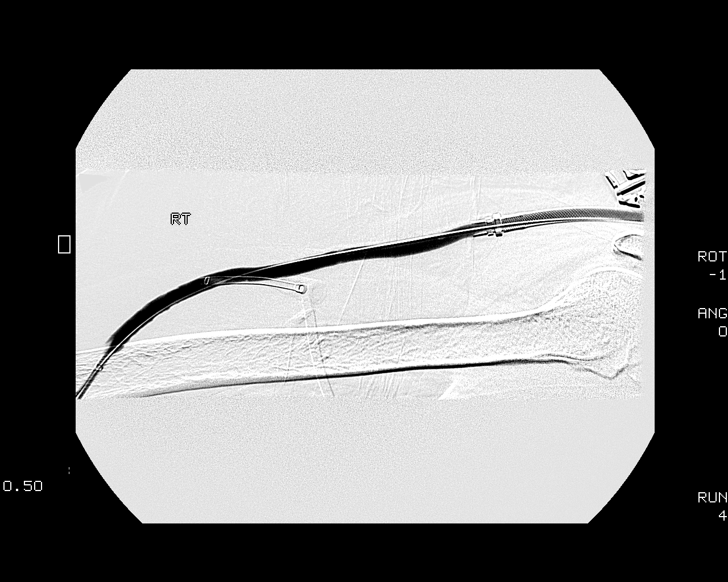
[im 35/39]
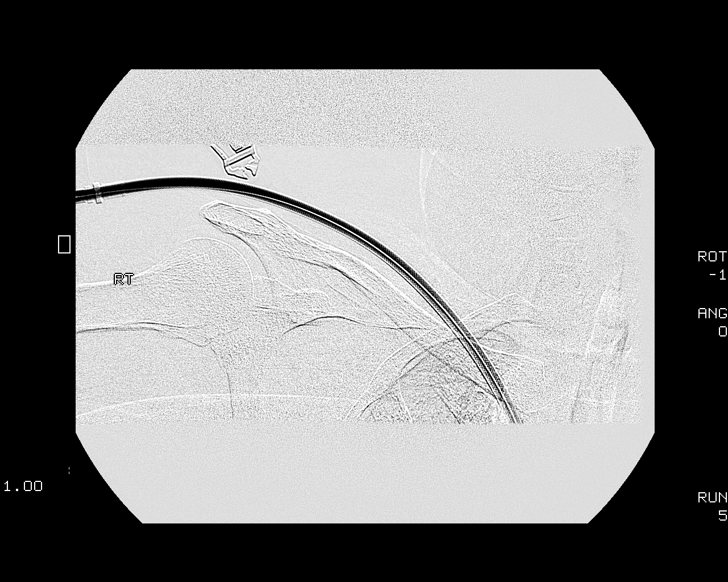
[im 39/39]
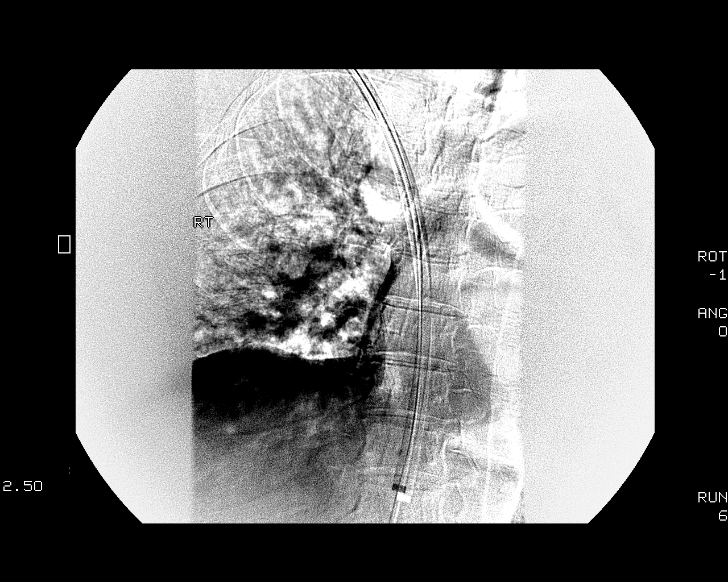

[12 of 24 positions shown; findings below may reference images not displayed]

1.  ULTRASOUND GUIDANCE FOR VASCULAR ACCESS OF DIALYSIS GRAFT.
2.  DIALYSIS GRAFT DECLOT PROCEDURE WITH TWO SEPARATE GRAFT ACCESS
SITES.
3.  VENOUS ANGIOPLASTY OF DIALYSIS GRAFT

Contrast:  60 ml Lmnipaque-EOO

Medications:  125 mg IV Solu-Medrol, 50 mg IV Benadryl, 2 mg tPA,
6111 U IV heparin

Fluoroscopy Time: 3.8 minutes.

Procedure:  The procedure, risks, benefits, and alternatives were
explained to the patient.  Questions regarding the procedure were
encouraged and answered.  The patient understands and consents to
the procedure.

The upper arm portion of the right-sided HeRO graft was prepped
with Betadine in a sterile fashion, and a sterile drape was applied
covering the operative field.  A sterile gown and sterile gloves
were used for the procedure. Local anesthesia was provided with 1%
Lidocaine.

Preliminary ultrasound was performed of the dialysis graft.  Both
antegrade and retrograde graft access was performed with
micropuncture sets under direct ultrasound guidance.  Ultrasound
image documentation was performed.  t-PA was instilled via each
access.  7-French antegrade and retrograde sheaths were placed.  A
diagnostic catheter was advanced and contrast injection performed
at the level of the central venous outflow.

Mechanical thrombectomy was performed within the HeRO graft with
the AngioJet device.  Thrombectomy across the arterial anastomosis
was then performed with a 4-French Fogarty balloon catheter.
Several passes were made with the Fogarty catheter.  Suction
thrombectomy was then performed through the antegrade sheath.

Balloon angioplasty was performed at the level of the graft with a
7 mm x 4 cm Conquest balloon.Graft patency was reassessed with
angiography.

Upon completion of the procedure, both sheaths were removed and
hemostasis obtained with application of 2-0 Ethilon pursestring
sutures.

Complications:  None
FINDINGS: Patency was restored of the HeRO graft.  The
irregularity within the graft portion of the device was improved
after mechanical thrombectomy and balloon angioplasty.  Catheter
segment was widely patent after completion.  The distal catheter
tip lies in the upper IVC with the patient supine.
IMPRESSION: Successful declot procedure to reestablish flow in an occluded
right upper extremity HeRO graft.

Access Management:  The HeRO graft should be amendable to repeat
percutaneous intervention.  As above, with the patient in a supine
position, the distal catheter tip does lie in the upper IVC.

## 2011-07-02 ENCOUNTER — Ambulatory Visit (INDEPENDENT_AMBULATORY_CARE_PROVIDER_SITE_OTHER): Payer: Medicare Other | Admitting: Surgery

## 2011-07-02 ENCOUNTER — Encounter: Payer: Self-pay | Admitting: Surgery

## 2011-07-02 VITALS — BP 166/88 | HR 90 | Temp 97.7°F | Resp 16 | Ht 67.0 in | Wt 293.0 lb

## 2011-07-02 DIAGNOSIS — T82898A Other specified complication of vascular prosthetic devices, implants and grafts, initial encounter: Secondary | ICD-10-CM

## 2011-07-02 DIAGNOSIS — N186 End stage renal disease: Secondary | ICD-10-CM

## 2011-07-02 NOTE — Progress Notes (Signed)
The patient recently underwent excision of an exposed portion of a right upper arm graft. This was closed with interrupted nylon sutures. One of the sutures pulled through the skin while she was in dialysis today and she was sent over for further evaluation.  I removed the suture did pull through the medial portion of the skin. The underlying tissue appears healthy and does not normally wear there is no fluid or drainage. There is no evidence of infection. I recommended that we packed this once a day with wet-to-dry dressings the patient will come back to see Dr. Darrick Penna next week had already scheduled visit for wound check.

## 2011-07-12 ENCOUNTER — Encounter: Payer: Self-pay | Admitting: Vascular Surgery

## 2011-07-12 ENCOUNTER — Ambulatory Visit (INDEPENDENT_AMBULATORY_CARE_PROVIDER_SITE_OTHER): Payer: Medicare Other | Admitting: Vascular Surgery

## 2011-07-12 DIAGNOSIS — N186 End stage renal disease: Secondary | ICD-10-CM

## 2011-07-12 DIAGNOSIS — Z09 Encounter for follow-up examination after completed treatment for conditions other than malignant neoplasm: Secondary | ICD-10-CM | POA: Insufficient documentation

## 2011-07-12 NOTE — Progress Notes (Signed)
Patient had removal of an infected exposed piece of Gore-Tex material in her right upper arm approximately 2 weeks ago. This did not involve her current right upper arm AV graft. She returns today for followup. She is been doing local wound care on the lower portion of the incision where graft was exposed and left open.   Physical exam: Right upper arm incision with nylon sutures in place well-healed 2 cm opening at the inferior aspect of the incision with good granulation tissue at the base, audible bruit and right upper arm graft with no surrounding erythema no obvious evidence of infection  Assessment: Healing wound right arm post graft removal  Plan: Sutures were removed today. She will followup on as-needed basis.   Fabienne Bruns, MD [facility] Office: [phone] Pager: [phone]

## 2011-07-24 ENCOUNTER — Encounter: Payer: Self-pay | Admitting: Physician Assistant

## 2011-07-25 ENCOUNTER — Ambulatory Visit (INDEPENDENT_AMBULATORY_CARE_PROVIDER_SITE_OTHER): Payer: Medicare Other | Admitting: Physician Assistant

## 2011-07-25 ENCOUNTER — Encounter: Payer: Self-pay | Admitting: Physician Assistant

## 2011-07-25 VITALS — BP 132/72 | HR 86 | Resp 20 | Ht 67.0 in | Wt 291.7 lb

## 2011-07-25 DIAGNOSIS — N186 End stage renal disease: Secondary | ICD-10-CM

## 2011-07-25 NOTE — Progress Notes (Signed)
[facility] Postoperative Visit hemodialysis access    CC: f/u for excision of exposed portion of RUA graft.    HPI:  This is a 56 y.o. female who returns today b/c she is worried about an area on the incision from where the exposed portion of the RUA graft was excised and the suture was removed and it hasn't healed.  She denies any fevers.  She states that she has seen "pus" from the wound.  PHYSICAL EXAMINATION:  Filed Vitals:   07/25/11 1308  BP: 132/72  Pulse: 86  Resp: 20     Incision: there is a small area that has not healed, but there is no purulent drainage; there is good granulation tissue and it does appear infected. Hand grip is equal bilaterally and sensation in digits is intact; There is  Thrill; there is bruit. The graft/fistula is easily palpable  Pulse:  +right palpable pulse  ASSESSMENT/PLAN:  SHERIANN NEWMANN is a 56 y.o. year old female who presents s/p excision of an exposed portion of her graft ~ 4 weeks ago.    -I had Dr. Edilia Bo examine the wound as well.  No purulent drainage can be expressed from the wound and it is clean.  She does receive ABx on HD. -Dr. Edilia Bo recommended bacitracin ointment to the wound after cleaning with soap and water daily. -she will f/u prn basis.  Newton Pigg, PA-C [facility] [phone]  Clinic MD:   Edilia Bo

## 2011-07-31 ENCOUNTER — Emergency Department (HOSPITAL_COMMUNITY): Payer: Medicare Other

## 2011-07-31 ENCOUNTER — Emergency Department (HOSPITAL_COMMUNITY)
Admission: EM | Admit: 2011-07-31 | Discharge: 2011-07-31 | Disposition: A | Discharge status: Other Acute Inpatient Hospital | Payer: Medicare Other | Attending: Emergency Medicine | Admitting: Emergency Medicine

## 2011-07-31 ENCOUNTER — Other Ambulatory Visit: Payer: Self-pay

## 2011-07-31 ENCOUNTER — Encounter (HOSPITAL_COMMUNITY): Payer: Self-pay | Admitting: Emergency Medicine

## 2011-07-31 DIAGNOSIS — N186 End stage renal disease: Secondary | ICD-10-CM | POA: Insufficient documentation

## 2011-07-31 DIAGNOSIS — G40909 Epilepsy, unspecified, not intractable, without status epilepticus: Secondary | ICD-10-CM | POA: Insufficient documentation

## 2011-07-31 DIAGNOSIS — F329 Major depressive disorder, single episode, unspecified: Secondary | ICD-10-CM | POA: Insufficient documentation

## 2011-07-31 DIAGNOSIS — K469 Unspecified abdominal hernia without obstruction or gangrene: Secondary | ICD-10-CM | POA: Insufficient documentation

## 2011-07-31 DIAGNOSIS — E669 Obesity, unspecified: Secondary | ICD-10-CM | POA: Insufficient documentation

## 2011-07-31 DIAGNOSIS — I509 Heart failure, unspecified: Secondary | ICD-10-CM | POA: Insufficient documentation

## 2011-07-31 DIAGNOSIS — J45909 Unspecified asthma, uncomplicated: Secondary | ICD-10-CM | POA: Insufficient documentation

## 2011-07-31 DIAGNOSIS — Q851 Tuberous sclerosis: Secondary | ICD-10-CM | POA: Insufficient documentation

## 2011-07-31 DIAGNOSIS — R569 Unspecified convulsions: Secondary | ICD-10-CM

## 2011-07-31 DIAGNOSIS — F3289 Other specified depressive episodes: Secondary | ICD-10-CM | POA: Insufficient documentation

## 2011-07-31 DIAGNOSIS — Z992 Dependence on renal dialysis: Secondary | ICD-10-CM | POA: Insufficient documentation

## 2011-07-31 DIAGNOSIS — R42 Dizziness and giddiness: Secondary | ICD-10-CM | POA: Insufficient documentation

## 2011-07-31 DIAGNOSIS — M1990 Unspecified osteoarthritis, unspecified site: Secondary | ICD-10-CM | POA: Insufficient documentation

## 2011-07-31 DIAGNOSIS — K219 Gastro-esophageal reflux disease without esophagitis: Secondary | ICD-10-CM | POA: Insufficient documentation

## 2011-07-31 DIAGNOSIS — F79 Unspecified intellectual disabilities: Secondary | ICD-10-CM | POA: Insufficient documentation

## 2011-07-31 DIAGNOSIS — Z79899 Other long term (current) drug therapy: Secondary | ICD-10-CM | POA: Insufficient documentation

## 2011-07-31 LAB — DIFFERENTIAL
Basophils Absolute: 0 10*3/uL (ref 0.0–0.1)
Basophils Relative: 1 % (ref 0–1)
Lymphocytes Relative: 18 % (ref 12–46)
Neutro Abs: 3.1 10*3/uL (ref 1.7–7.7)
Neutrophils Relative %: 75 % (ref 43–77)

## 2011-07-31 LAB — BASIC METABOLIC PANEL
CO2: 29 mEq/L (ref 19–32)
Chloride: 94 mEq/L — ABNORMAL LOW (ref 96–112)
GFR calc Af Amer: 9 mL/min — ABNORMAL LOW (ref >90)
Sodium: 136 mEq/L (ref 135–145)

## 2011-07-31 LAB — CBC
Platelets: 128 10*3/uL — ABNORMAL LOW (ref 150–400)
RDW: 16.9 % — ABNORMAL HIGH (ref 11.5–15.5)
WBC: 4.1 10*3/uL (ref 4.0–10.5)

## 2011-07-31 NOTE — ED Notes (Signed)
Patient on phone resting comfortably on stretcher.

## 2011-07-31 NOTE — ED Notes (Addendum)
Patient back in room from CT. Patient placed back on monitor and oxygen saturation at 96% on RA. Patient resting with NAD at this time.

## 2011-07-31 NOTE — ED Notes (Signed)
As pt dressed and was assisted to w/c; various pills fell out of her jacket and pt leaned over and picked them up stating they were hers but wouldn't identify them.

## 2011-07-31 NOTE — ED Notes (Signed)
Patient resting and states she is nauseated. Patient remain on monitor and oxygen at 2L with saturation of 100%.

## 2011-07-31 NOTE — ED Notes (Signed)
IV Team paged 

## 2011-07-31 NOTE — ED Notes (Signed)
Patient states she is on dialysis and doesn't produce urine.

## 2011-07-31 NOTE — ED Provider Notes (Signed)
History     CSN: [id number]  Arrival date & time 07/31/11  1504   First MD Initiated Contact with Patient 07/31/11 1508      Chief Complaint  Patient presents with  . Seizures    (Consider location/radiation/quality/duration/timing/severity/associated sxs/prior treatment) HPI Comments: Patient presents to the emergency department from her nursing home with the chief complaint of seizure.  Note the patient has a history of MR, kidney disease on dialysis, and epilepsy  Seizure Disorder: Episodes first started sudden, not related to any specific activity. Onset of symptoms was sudden, not related to any specific activity. She has had only one episode. She has had no loss of consciousness, initially dazed then progressing to complete LOC and with abrupt return to consiousness. Abnormal movements none present. Episode duration is <1  minute. Premonitory symptoms include dizziness and lightheadedness. After episodes she has  immediate return to normal level of consciousness. Seizures appear to be precipitated by no precipitation factors noted. Pt denies associated s/s including incontinence of urine & stool, loss of memory, strange smells, strange sensations, visual hallucinations and falling out of bed. Patient does not have a history of head trauma.  She does not have a history of CVA. She does not have a history of alcohol abuse. She does not have a history of drug abuse. She does have a history of developmental delay. She does not have a family history of seizure disorder. metabolic screen.   Pt is currently followed by Dr. Lovell Sheehan, neurology & has her next appointment March 12th. Her last visit was 06/27/11, and there were no changes in her medication. Treatment thus far has been: Topamax 200mg  BID, with good results.      Patient is a 56 y.o. female presenting with seizures. The history is provided by the patient.  Seizures  Pertinent negatives include no confusion, no headaches, no speech  difficulty, no visual disturbance, no chest pain, no cough, no nausea, no vomiting and no diarrhea.    Past Medical History  Diagnosis Date  . Renal insufficiency   . Mental retardation   . Sleep apnea   . End-stage renal disease on hemodialysis   . Tuberous sclerosis   . Obesity   . GERD (gastroesophageal reflux disease)   . Depression   . Mental retardation   . Sleep apnea     on c-pap  . Hx MRSA infection   . Obesity   . Complication of anesthesia     "sometimes I don't wake up on time; depends on how much med"  . Asthma   . 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 Osteoarthritis   . Angina   . CHF (congestive heart failure)   . Shortness of breath     "all the time  . Pneumonia ? 2006  . Renal failure   . ESRD (end stage renal disease) on dialysis 05/25/11    "monday;wed;fri; last time was 05/25/11; [address]  . Blood transfusion   . Anemia   . Stomach ulcer   . Constipated   . Seizures     last seizure 05/21/11    Past Surgical History  Procedure Date  . Uterine tumor 05/25/11    pt denies this history  . Nephrectomy 01/08/1994; 01/15/2006    left; right  . Shunt in arm for dialysis     right   . Thrombectomy 05/2010    shunt in my right arm  . Cholecystectomy 07/02/1990  . Tonsillectomy 03/03/1978  . Avgg removal  06-26-11    Right arm AVG removed.    Family History  Problem Relation Age of Onset  . Diabetes Father   . Asthma Son     History  Substance Use Topics  . Smoking status: Never Smoker   . Smokeless tobacco: Never Used  . Alcohol Use: No    OB History    Grav Para Term Preterm Abortions TAB SAB Ect Mult Living                  Review of Systems  Constitutional: Negative for fever and chills.  HENT: Negative for congestion, neck pain and neck stiffness.   Eyes: Negative for photophobia, pain and visual disturbance.  Respiratory: Negative for cough, shortness of breath, wheezing and stridor.   Cardiovascular: Negative for chest pain and  palpitations.  Gastrointestinal: Negative for nausea, vomiting, abdominal pain and diarrhea.  Musculoskeletal: Negative for gait problem.  Neurological: Positive for dizziness and seizures. Negative for tremors, syncope, facial asymmetry, speech difficulty, weakness, light-headedness, numbness and headaches.  Psychiatric/Behavioral: Negative for hallucinations, confusion and sleep disturbance.    Allergies  Iodinated diagnostic agents; Methylprednisolone; and Other  Home Medications   Current Outpatient Rx  Name Route Sig Dispense Refill  . IPRATROPIUM-ALBUTEROL 18-103 MCG/ACT IN AERO Inhalation Inhale 2 puffs into the lungs every 4 (four) hours as needed. As needed for asthma     . NEPHRO-VITE 0.8 MG PO TABS Oral Take 0.8 mg by mouth at bedtime.      Marland Kitchen CALCIUM ACETATE 667 MG PO CAPS Oral Take 1,334 mg by mouth 3 (three) times daily with meals.      . CELECOXIB 200 MG PO CAPS Oral Take 200 mg by mouth 2 (two) times daily as needed. For pain    . CLONAZEPAM 0.5 MG PO TABS Oral Take 0.5 mg by mouth. One on days of dialysis MWF    . CYCLOBENZAPRINE HCL 10 MG PO TABS Oral Take 10 mg by mouth 2 (two) times daily.      . DULOXETINE HCL 30 MG PO CPEP Oral Take 30 mg by mouth 2 (two) times daily.     Marland Kitchen ESOMEPRAZOLE MAGNESIUM 40 MG PO CPDR Oral Take 40 mg by mouth daily before breakfast.      . FEXOFENADINE HCL 180 MG PO TABS Oral Take 180 mg by mouth daily as needed. For allergies    . FLUTICASONE PROPIONATE 50 MCG/ACT NA SUSP Nasal Place 2 sprays into the nose daily.      Marland Kitchen FLUTICASONE-SALMETEROL 250-50 MCG/DOSE IN AEPB Inhalation Inhale 1 puff into the lungs every 12 (twelve) hours.      Marland Kitchen HYDROCORTISONE ACE-PRAMOXINE 2.5-1 % RE CREA Rectal Place rectally 2 (two) times daily.      Marland Kitchen POLYSACCHARIDE IRON COMPLEX 150 MG PO CAPS Oral Take 150 mg by mouth. Alternates with ferri-seque every day     . KETOCONAZOLE 2 % EX CREA Topical Apply topically 3 (three) times daily as needed. For rash    .  MIDODRINE HCL 10 MG PO TABS Oral Take 10 mg by mouth 3 (three) times daily.      Marland Kitchen MUPIROCIN 2 % EX OINT Topical Apply topically 2 (two) times daily.      Marland Kitchen ONDANSETRON HCL 4 MG PO TABS Oral Take 4 mg by mouth every 8 (eight) hours as needed. For nausea    . PRIMIDONE 250 MG PO TABS Oral Take 500 mg by mouth 2 (two) times daily.     Marland Kitchen  SEVELAMER HCL 800 MG PO TABS Oral Take 1,600 mg by mouth 3 (three) times daily with meals.     . TOPIRAMATE 200 MG PO TABS Oral Take 200 mg by mouth 2 (two) times daily.        BP 153/94  Pulse 85  Temp(Src) 97.7 F (36.5 C) (Oral)  Resp 22  SpO2 99%  Physical Exam  Nursing note and vitals reviewed. Constitutional: She is oriented to person, place, and time. She appears well-developed and well-nourished. No distress.  HENT:  Head: Normocephalic and atraumatic.  Mouth/Throat: Oropharynx is clear and moist. No oropharyngeal exudate.  Eyes: Conjunctivae and EOM are normal. Pupils are equal, round, and reactive to light. No scleral icterus.  Neck: Normal range of motion. Neck supple. No tracheal deviation present. No thyromegaly present.  Cardiovascular: Normal rate, regular rhythm, normal heart sounds and intact distal pulses.   Pulmonary/Chest: Effort normal and breath sounds normal. No stridor. No respiratory distress. She has no wheezes.  Abdominal: Soft. Bowel sounds are normal. A hernia is present.       Obese. Large non tender hernia.   Musculoskeletal: Normal range of motion. She exhibits no edema and no tenderness.  Neurological: She is alert and oriented to person, place, and time. She has normal strength. No cranial nerve deficit or sensory deficit. She displays a negative Romberg sign. Coordination and gait normal.  Skin: Skin is warm and dry. She is not diaphoretic. No pallor.       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 Her behavior is normal.    ED Course  Procedures (including critical care time)   Labs Reviewed  URINALYSIS, ROUTINE W REFLEX MICROSCOPIC  URINE CULTURE  CBC  DIFFERENTIAL  BASIC METABOLIC PANEL   No results found.   No diagnosis found.  5:12 PM  Patient reevaluated and appears to be more lethargic and altered mentally than prior evaluation. There is no known reason based on pts labs or physical exam. CT head has been ordered. She states that she is tired and just wants to sleep.  Patient is unable to state her birthday correctly when she was able to do this previously.  Note the patient's oxygen saturation had dropped into the high 80s.  Patient has been placed on 2 L nasal cannula (pt not on O2 at baseline). Pt has been discussed with Dr. Ignacia Palma who will also see & evaluate.   Pt UA/ culture canceled, unable to make urine from CKD.   6:15 PM Pt states she still feels "out of it" and not normal since seizure. Her O2 saturation is now 22 with and she has been taken off [place]. Re-evaluated and she has no neurological deficits. Pt is AOx3  CT HEAD WITHOUT CONTRAST  Technique: Contiguous axial images were obtained from the base of the skull through the vertex without contrast.  Comparison: 05/25/2011  Findings: Beam hardening artifacts at skull base from shoulders. Normal ventricular morphology. No midline shift or mass effect. Subependymal calcifications at lateral ventricles consistent with tuberous sclerosis. No intracranial hemorrhage, mass lesion or evidence of acute infarction. No definite extra-axial fluid collections. Hyperostosis frontalis interna. Visualized paranasal sinuses and mastoid air cells clear.  IMPRESSION: No acute intracranial abnormalities. 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  Original Report Authenticated By: Lollie Marrow, M.D.       7:16 PM  Patient was also evaluated by Dr. Ignacia Palma who  agrees with my plan to discharge patient back to her nursing home.  Patient is alert and oriented x3, in no acute distress, and states that she feels she is back at her baseline.  Patient's CT results showed no acute intracranial abnormalities and her lab work showed no reason of lowering seizure threshold.  It is been recommended the patient followup with her neurologist in regards to her medications and dosage if seizures persist.Patient is agreeable with above.     MDM  Seizure  Patient's oxygen saturation is 97 on route air, is hemodynamically stable and appears to have returned to baseline.  Patient likely had a seizure while in nursing home and presented to the emergency Department and postictal state.  Patient is being discharged with instructions to keep taking medications as prescribed by her doctor and to followup with her neurologist if seizures continue.  Medical screening examination/treatment/procedure(s) were conducted as a shared visit with non-physician practitioner(s) and myself.  I personally evaluated the patient during the encounter Pt with known seizure disorder had a seizure.  Exam showed her to be somnolent initially, but over about 3 hours she cleared her sensorium, and was released back to her facility.  Osvaldo Human, M.D.   Jaci Carrel, PA-C 07/31/11 1920  Carleene Cooper III, MD 07/31/11 [phone]

## 2011-07-31 NOTE — ED Provider Notes (Signed)
3:40 PM  Date: 07/31/2011  Rate: 78  Rhythm: normal sinus rhythm  QRS Axis: left  Intervals: PR prolonged PQRS:  Left atrial abnormality.  Q waves in inferior leads suggest possible old inferior myocardial infarction.  ST/T Wave abnormalities: normal  Conduction Disutrbances:nonspecific intraventricular conduction delay  Narrative Interpretation:   Old EKG Reviewed: unchanged    Carleene Cooper III, MD 07/31/11 [phone]

## 2011-07-31 NOTE — ED Notes (Signed)
IV Team Nurse at bedside.  

## 2011-07-31 NOTE — ED Notes (Addendum)
Patient states she had a seizure and states she was laying down trying to sleep and her felt dizzy when the seizure came on. Staff witnessed seizure. Patient states her seizures usually last about 5 minutes. Patient denies any chest pain or general pain. Patient denies N/V.  Patient states she still fells out of it. Patient had dialysis graft in her right arm and states she takes dialysis on Monday, Wednesday and Friday. Last dialysis treatment was on Monday this week.

## 2011-07-31 NOTE — ED Notes (Signed)
IV Team nurse called and states she will be her in a little while.

## 2011-07-31 NOTE — ED Notes (Signed)
To ED from Nursing home via EMS for possible seizure, staff reported "blank stare" and unresponsiveness, EMS reports neg stroke scale, A/O to baseline, CBG 94, ?EKG changes, pt c/o feeling tired, NAD

## 2011-08-27 IMAGING — CT CT HEAD W/O CM
1 of 2 series · 15 of 30 positions shown, 19 images · non-contrast
Comparison: CT of the head performed 09/11/2008

CLINICAL DATA: Status post two falls this morning; hit back and
front of head, with headache.

CT HEAD WITHOUT CONTRAST
TECHNIQUE: Contiguous axial images were obtained from the base of
the skull through the vertex without contrast.

[Series 3: recon 2: brain · axial · 0.49mm/px · z∈[+93,+240]mm · 15 of 72 slices shown, 19 images]
[im 4/72  brain]
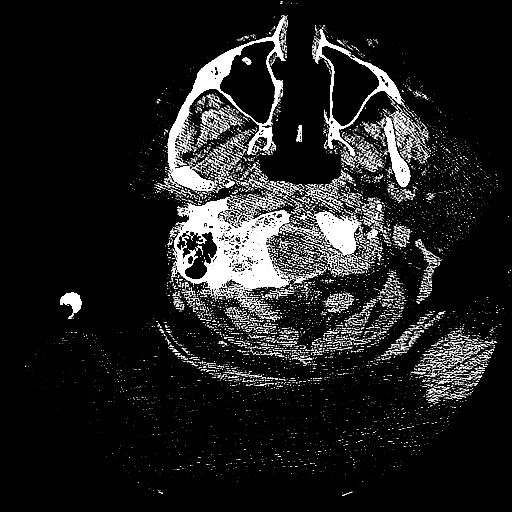
[im 4/72  bone]
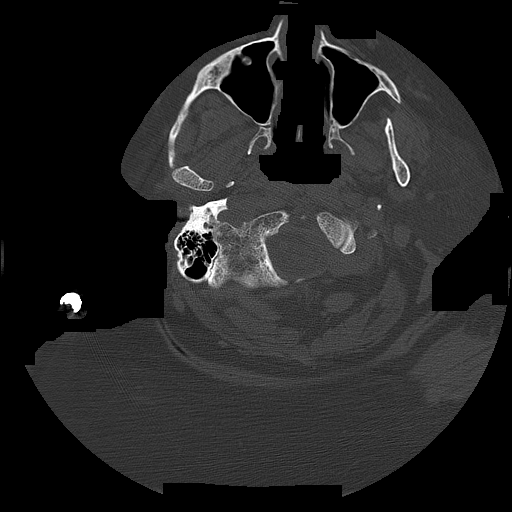
[im 8/72  brain]
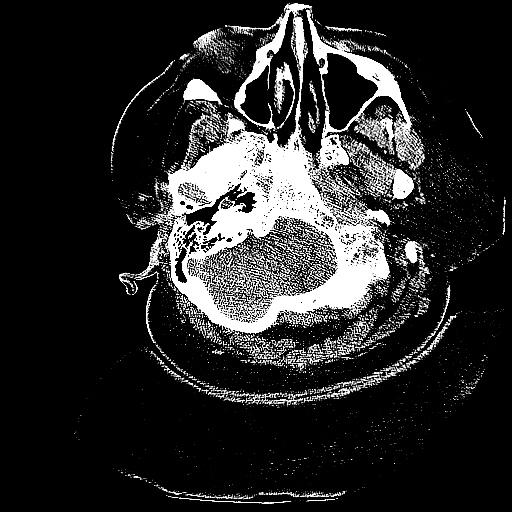
[im 15/72  brain]
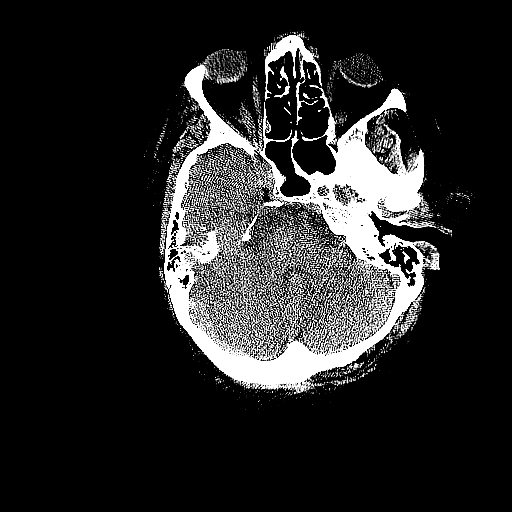
[im 19/72  brain]
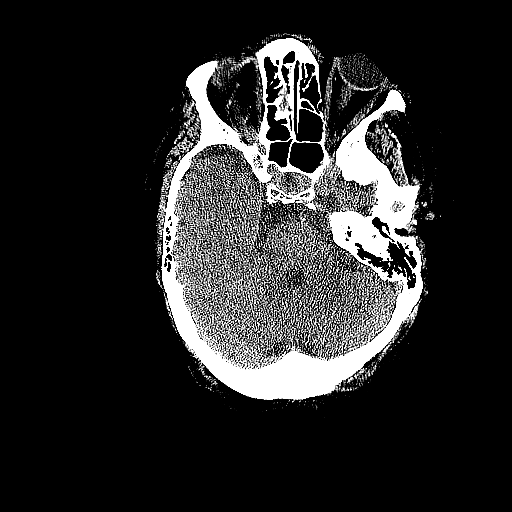
[im 23/72  brain]
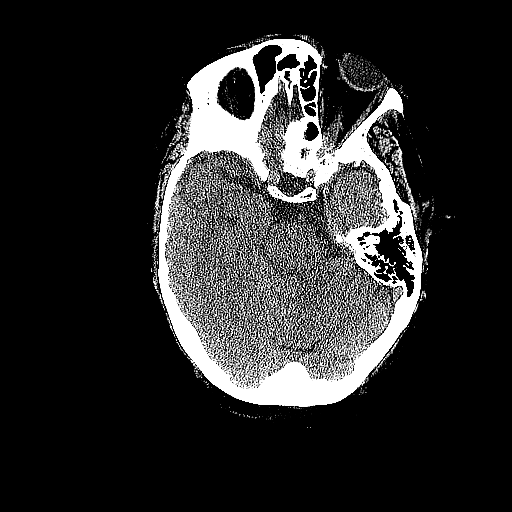
[im 23/72  bone]
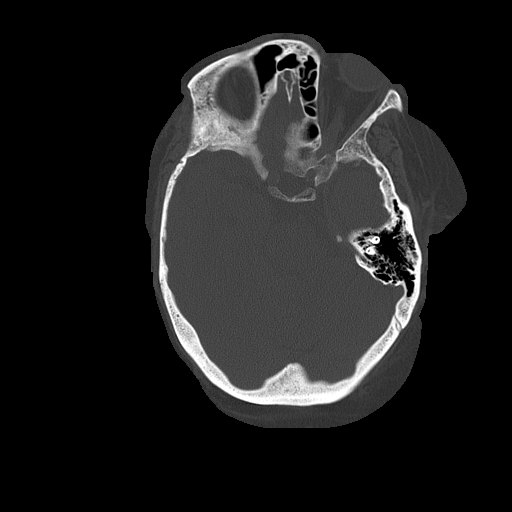
[im 27/72  brain]
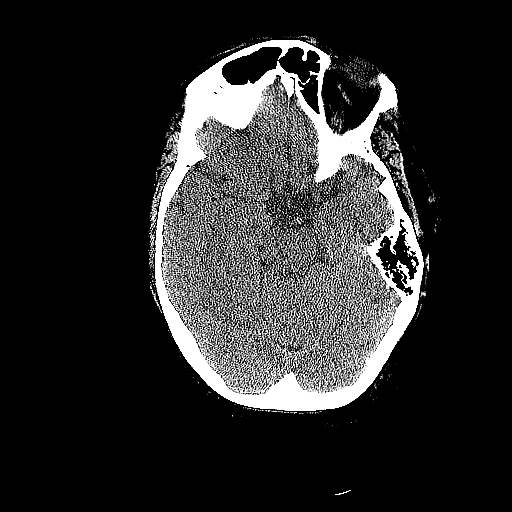
[im 30/72  brain]
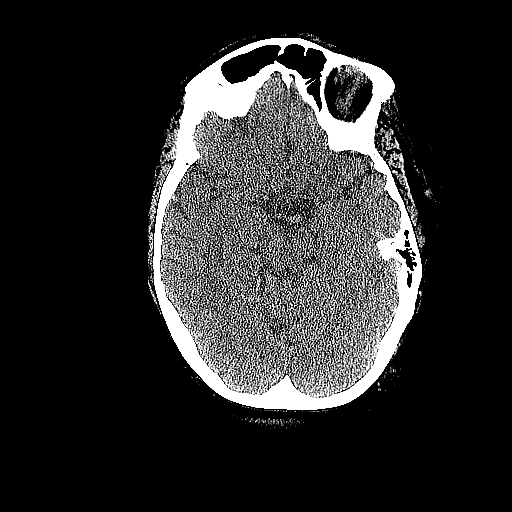
[im 38/72  brain]
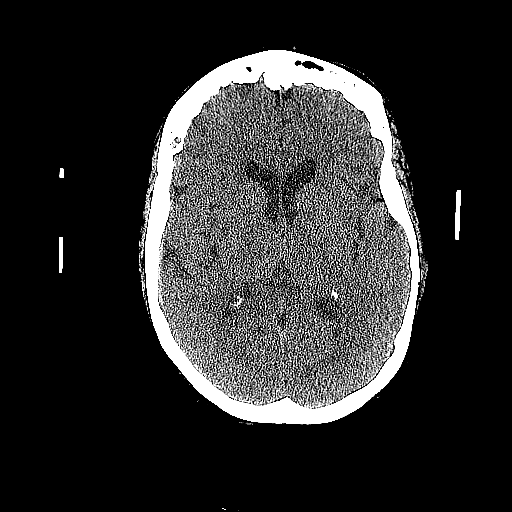
[im 42/72  brain]
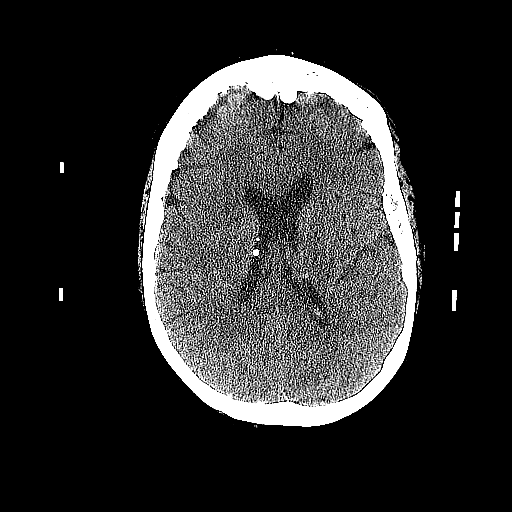
[im 42/72  bone]
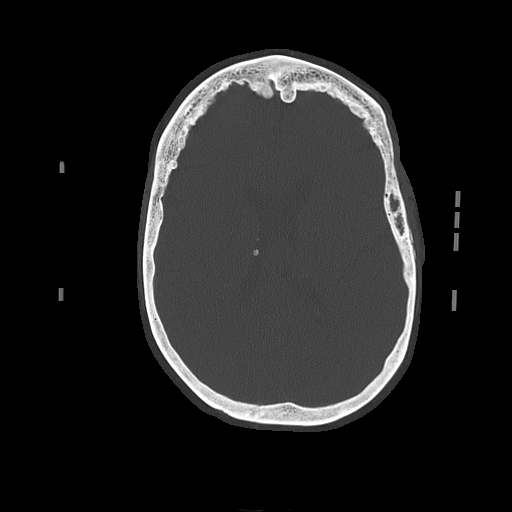
[im 45/72  brain]
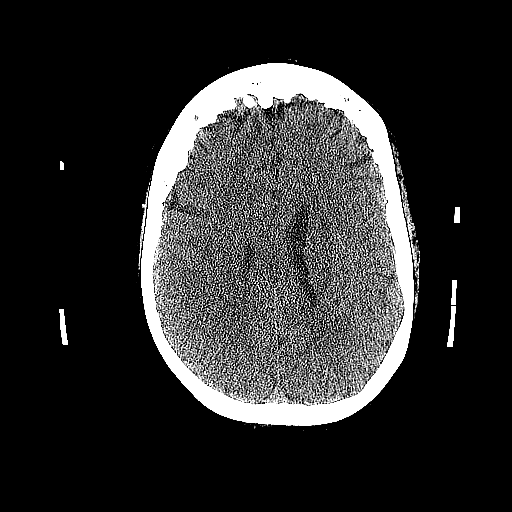
[im 49/72  brain]
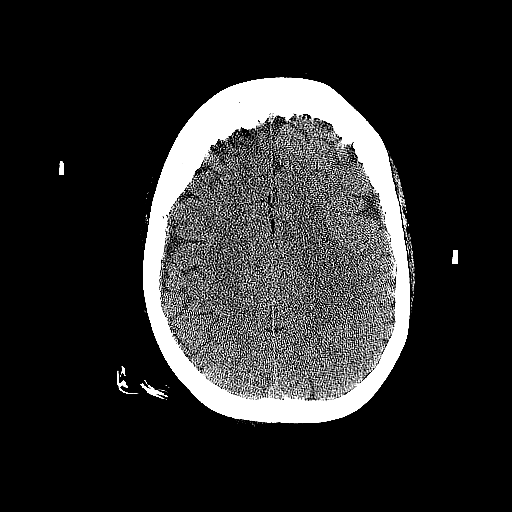
[im 53/72  brain]
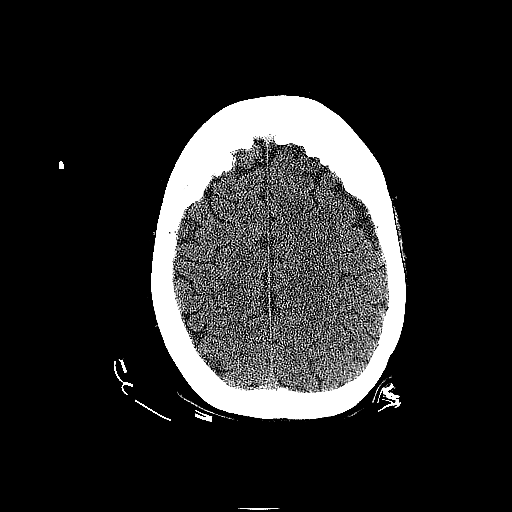
[im 60/72  brain]
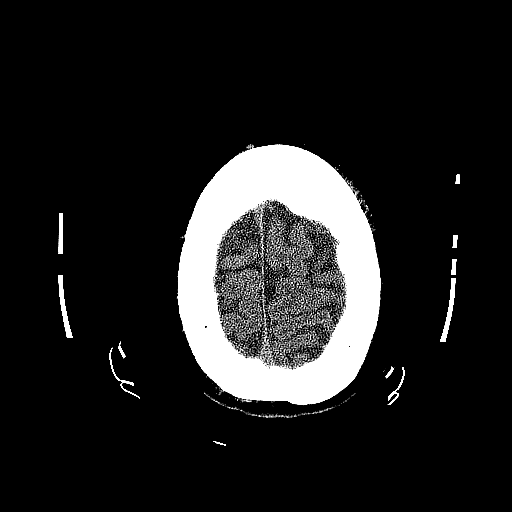
[im 60/72  bone]
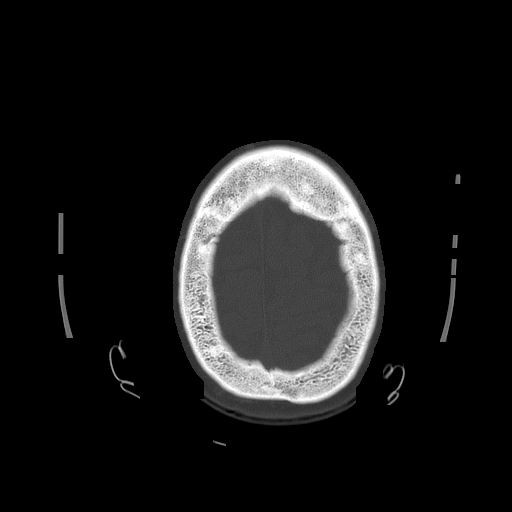
[im 64/72  brain]
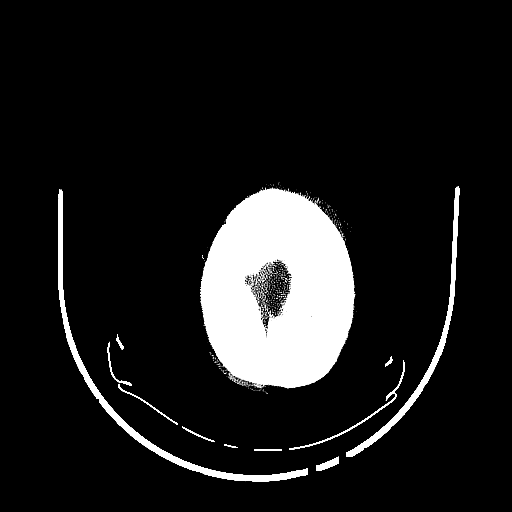
[im 68/72  brain]
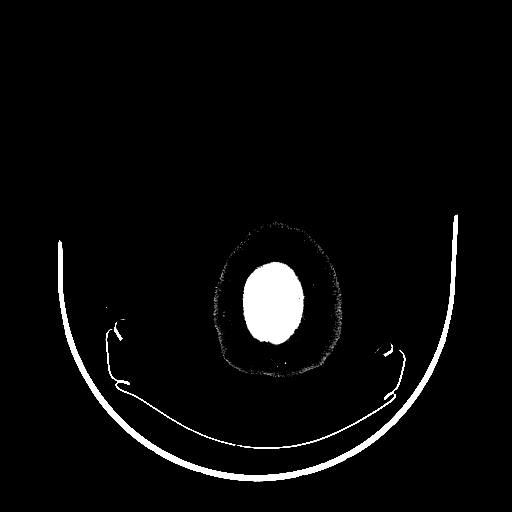

[15 of 30 positions shown; findings below may reference images not displayed]

FINDINGS: There is no evidence of acute infarction, mass lesion, or
intra- or extra-axial hemorrhage on CT.

Stable subependymal calcifications along both lateral ventricles
again are thought most likely to reflect tuberous sclerosis.
Sequelae of prior intrauterine TORCH infection remains a
consideration.

The posterior fossa, including the cerebellum, brainstem and fourth
ventricle, is within normal limits.  The third and lateral
ventricles, and basal ganglia are unremarkable in appearance.  The
cerebral hemispheres are symmetric in appearance, with normal gray-
white differentiation.  No mass effect or midline shift is seen.

There is no evidence of fracture; hyperostosis frontalis interna is
noted.  The visualized portions of the orbits are within normal
limits.  The paranasal sinuses and mastoid air cells are well-
aerated.  Mild focal soft tissue swelling is noted at the anterior
left vertex.  A flat broad 1.6 cm skin lesion overlying the right
frontal calvarium appears stable from 9110 and is likely benign.
IMPRESSION: 1.  No evidence of traumatic intracranial injury or fracture.
2.  Mild focal soft tissue swelling at the anterior left vertex.
3.  Stable subependymal calcifications again noted along both
lateral ventricles; this most likely reflects tuberous sclerosis,
though sequelae of prior intrauterine TORCH infection remains a
consideration.
4.  Stable benign-appearing 1.6 cm flat skin lesion overlying the
right frontal calvarium.

## 2011-08-30 ENCOUNTER — Emergency Department: Payer: Self-pay | Admitting: Emergency Medicine

## 2011-08-30 LAB — CBC
HCT: 30.8 % — ABNORMAL LOW (ref 35.0–47.0)
MCHC: 33.5 g/dL (ref 32.0–36.0)
MCV: 101 fL — ABNORMAL HIGH (ref 80–100)
RBC: 3.06 10*6/uL — ABNORMAL LOW (ref 3.80–5.20)
RDW: 18.1 % — ABNORMAL HIGH (ref 11.5–14.5)
WBC: 2.5 10*3/uL — ABNORMAL LOW (ref 3.6–11.0)

## 2011-08-30 LAB — BASIC METABOLIC PANEL
Anion Gap: 11 (ref 7–16)
BUN: 21 mg/dL — ABNORMAL HIGH (ref 7–18)
Chloride: 98 mmol/L (ref 98–107)
Creatinine: 3.97 mg/dL — ABNORMAL HIGH (ref 0.60–1.30)
EGFR (African American): 15 — ABNORMAL LOW
Sodium: 139 mmol/L (ref 136–145)

## 2011-09-09 IMAGING — CT CT CHEST W/O CM
2 of 3 series · 14 of 31 positions shown, 16 images · non-contrast
Comparison: CT 05/31/2008 at of the

CLINICAL DATA: Unexplained shortness of breath, follow-up lung
nodules

CT CHEST WITHOUT CONTRAST
TECHNIQUE: Multidetector CT imaging of the chest was performed
following the standard protocol without IV contrast.

[Series 3: routine chest · axial · 0.74mm/px · z∈[-235,-20]mm · 6 of 61 slices shown, 8 images]
[im 9/61  mediastinal]
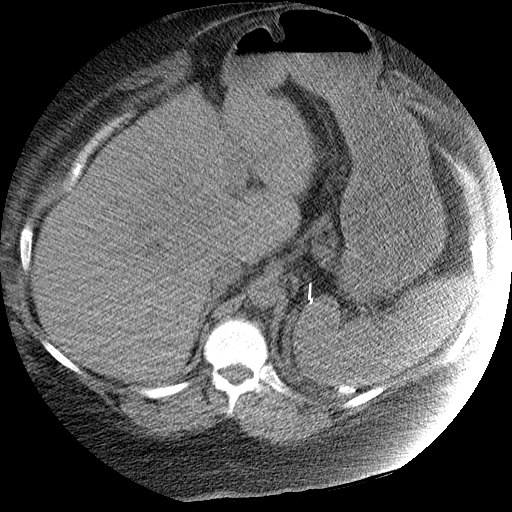
[im 9/61  lung]
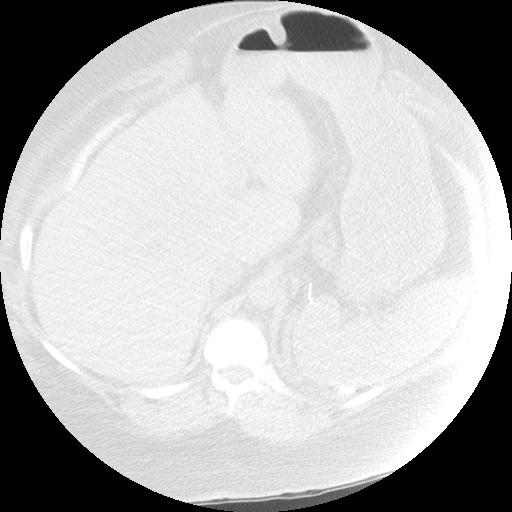
[im 18/61  lung]
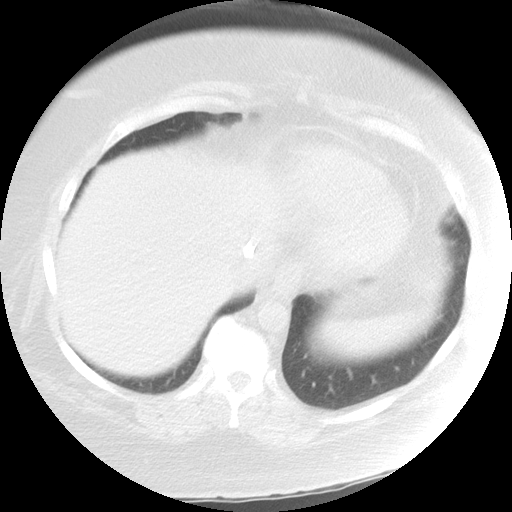
[im 26/61  lung]
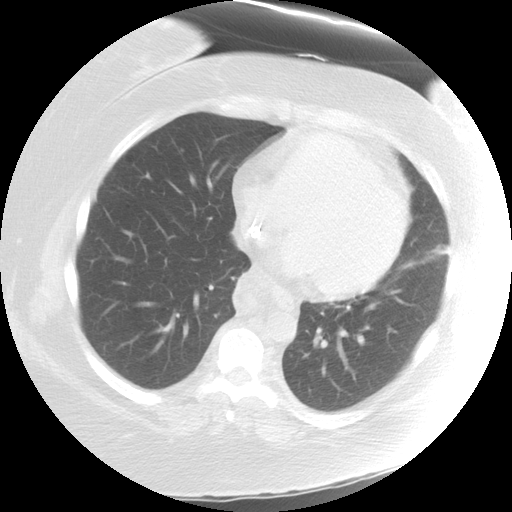
[im 35/61  lung]
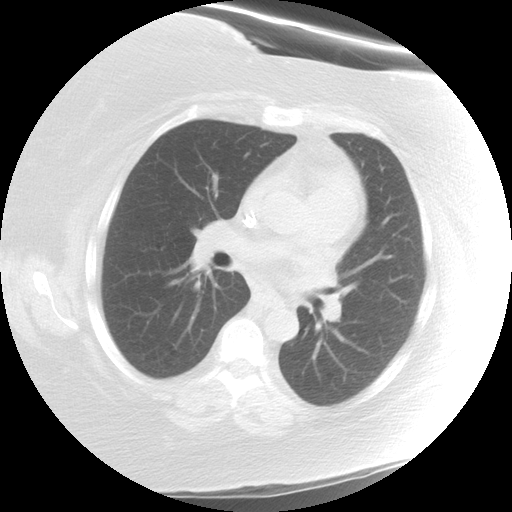
[im 43/61  mediastinal]
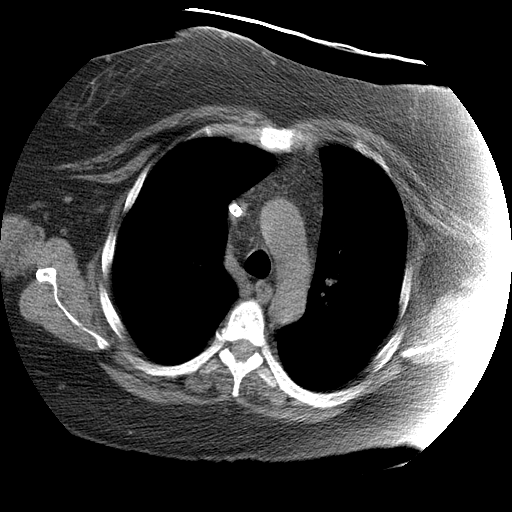
[im 43/61  lung]
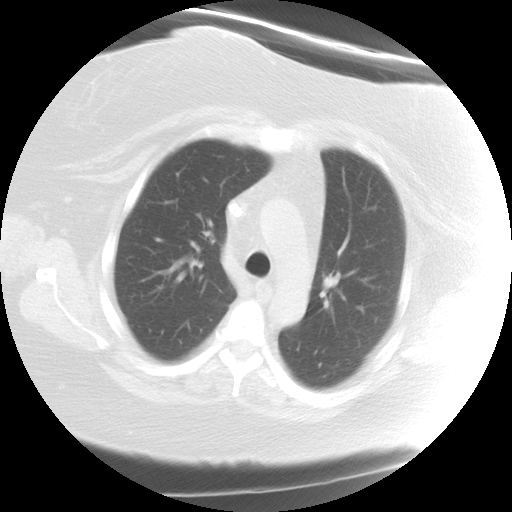
[im 52/61  lung]
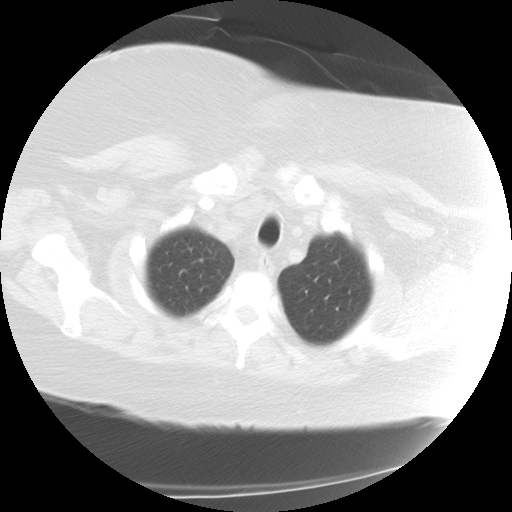

[Series 602: sagittal body · sagittal · 0.74mm/px · 8 of 152 slices shown]
[im 16/152  mediastinal]
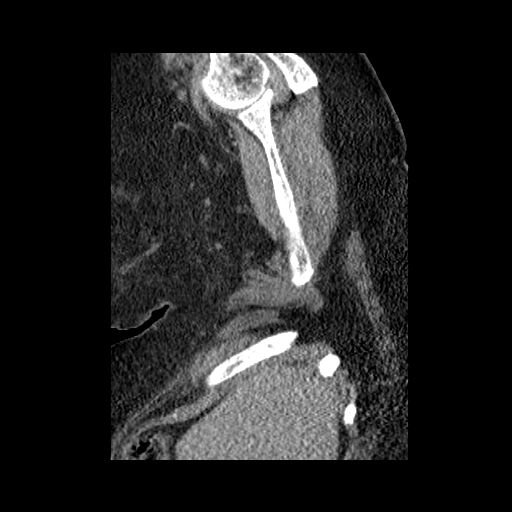
[im 32/152  mediastinal]
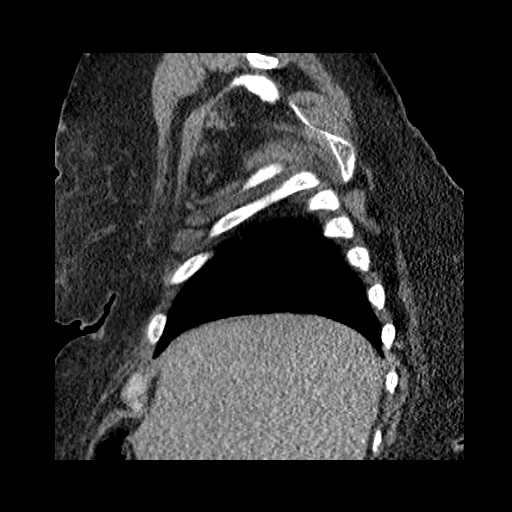
[im 48/152  mediastinal]
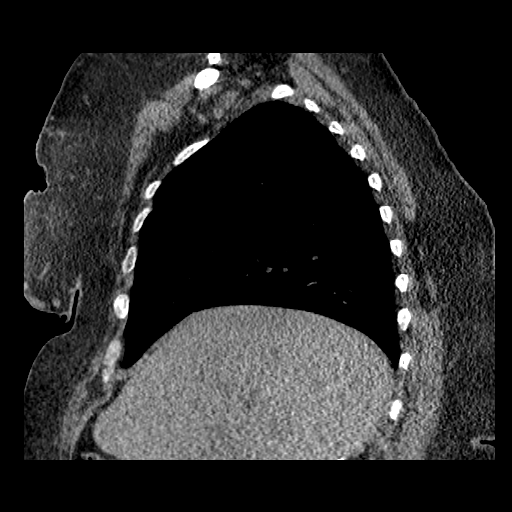
[im 72/152  mediastinal]
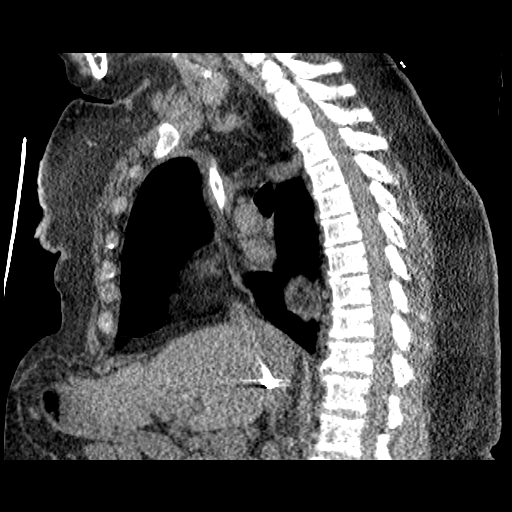
[im 80/152  mediastinal]
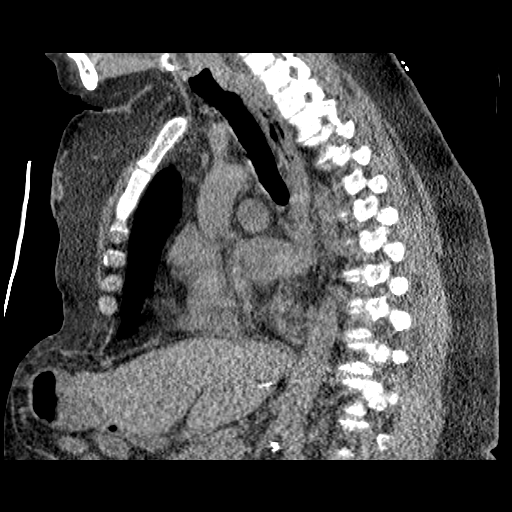
[im 104/152  mediastinal]
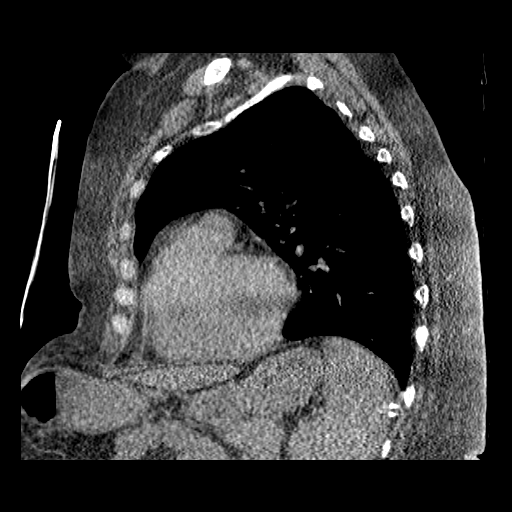
[im 120/152  mediastinal]
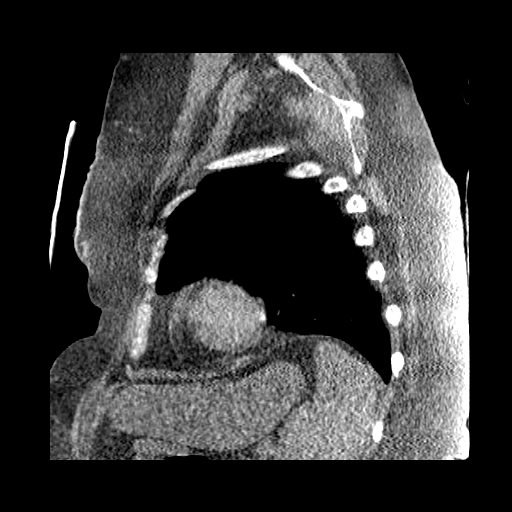
[im 136/152  mediastinal]
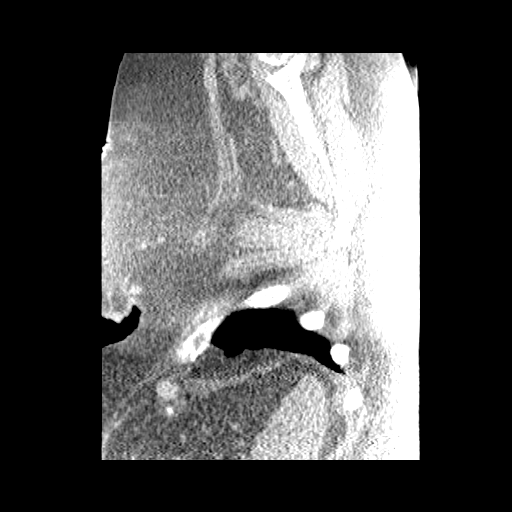

[14 of 31 positions shown; findings below may reference images not displayed]

FINDINGS: No evidence of axillary or supraclavicular
lymphadenopathy.  Interval decrease in the number of  paratracheal
lymph nodes compared to prior.  No pathologic enlarged lymph nodes.
No pericardial effusion.

Review of the lung parenchyma demonstrates interval resolution of
the subpleural pulmonary nodules described on comparison exam.
There is a single 4 mm right lower lobe pulmonary nodule (image 32)
which is not clearly seen on prior. There are several 1-2 mm ill-
defined nodules within the right middle lobe.

Limited view of the upper abdomen demonstrates a central venous
catheter with tip in the  inferior vena cava.  Surgical clips in
the upper abdomen.  There is a fatty lesion in the para esophageal
mediastinum just above GE junction which is unchanged from prior.

Limited view of the skeleton demonstrates a hemangioma in the T6
vertebral body.
IMPRESSION: 1.  Interval resolution of subpleural nodules described on prior.
2.  Single 4 mm noncalcified pulmonary nodule.  If the patient does
not have a known primary carcinoma this does not require specific
follow-up. Per Fleischner criteria.
3.  Benign  appearing and fatty lesion at the gastroesophageal
junction is unchanged from prior.

## 2011-09-10 IMAGING — CR DG FOOT COMPLETE 3+V*R*
3 series · 3 of 3 positions shown · non-contrast
Comparison: None.

CLINICAL DATA: Fall with right foot pain and bruising.

RIGHT FOOT COMPLETE - 3+ VIEW

[view not recorded (1 of 3)]
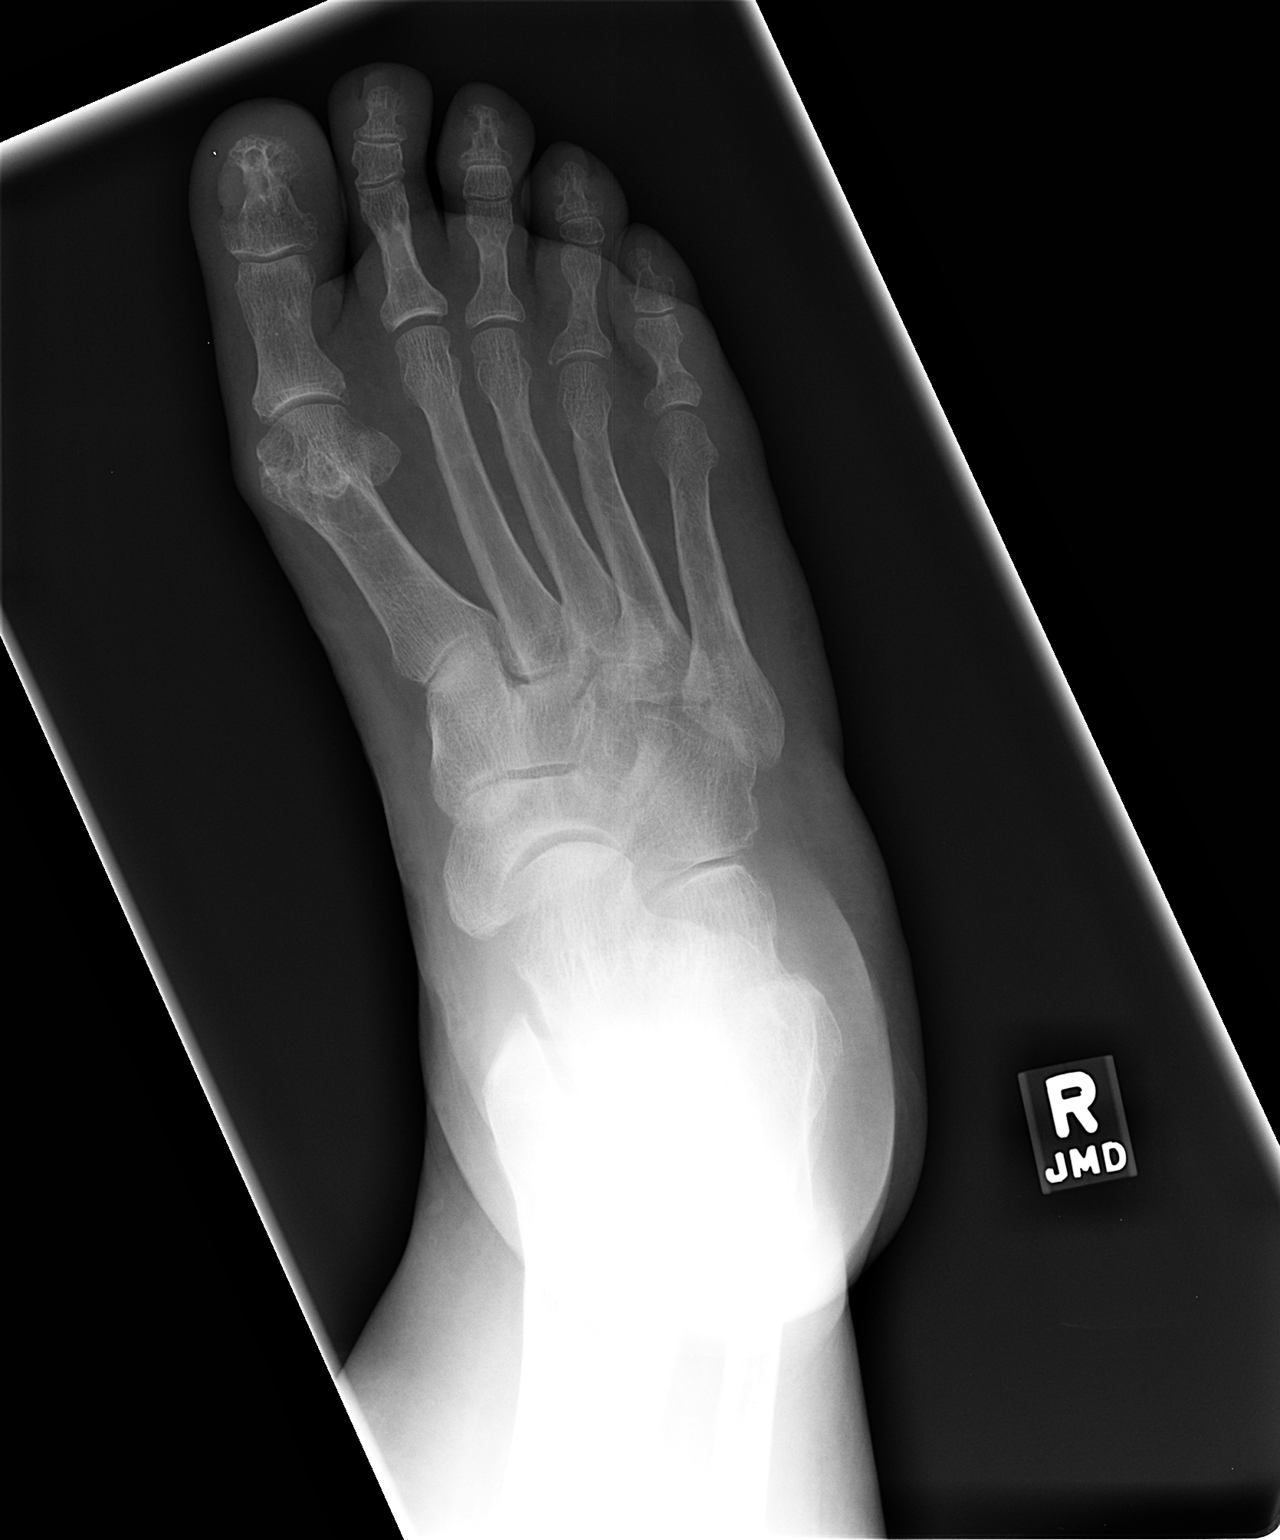

[view not recorded (2 of 3)]
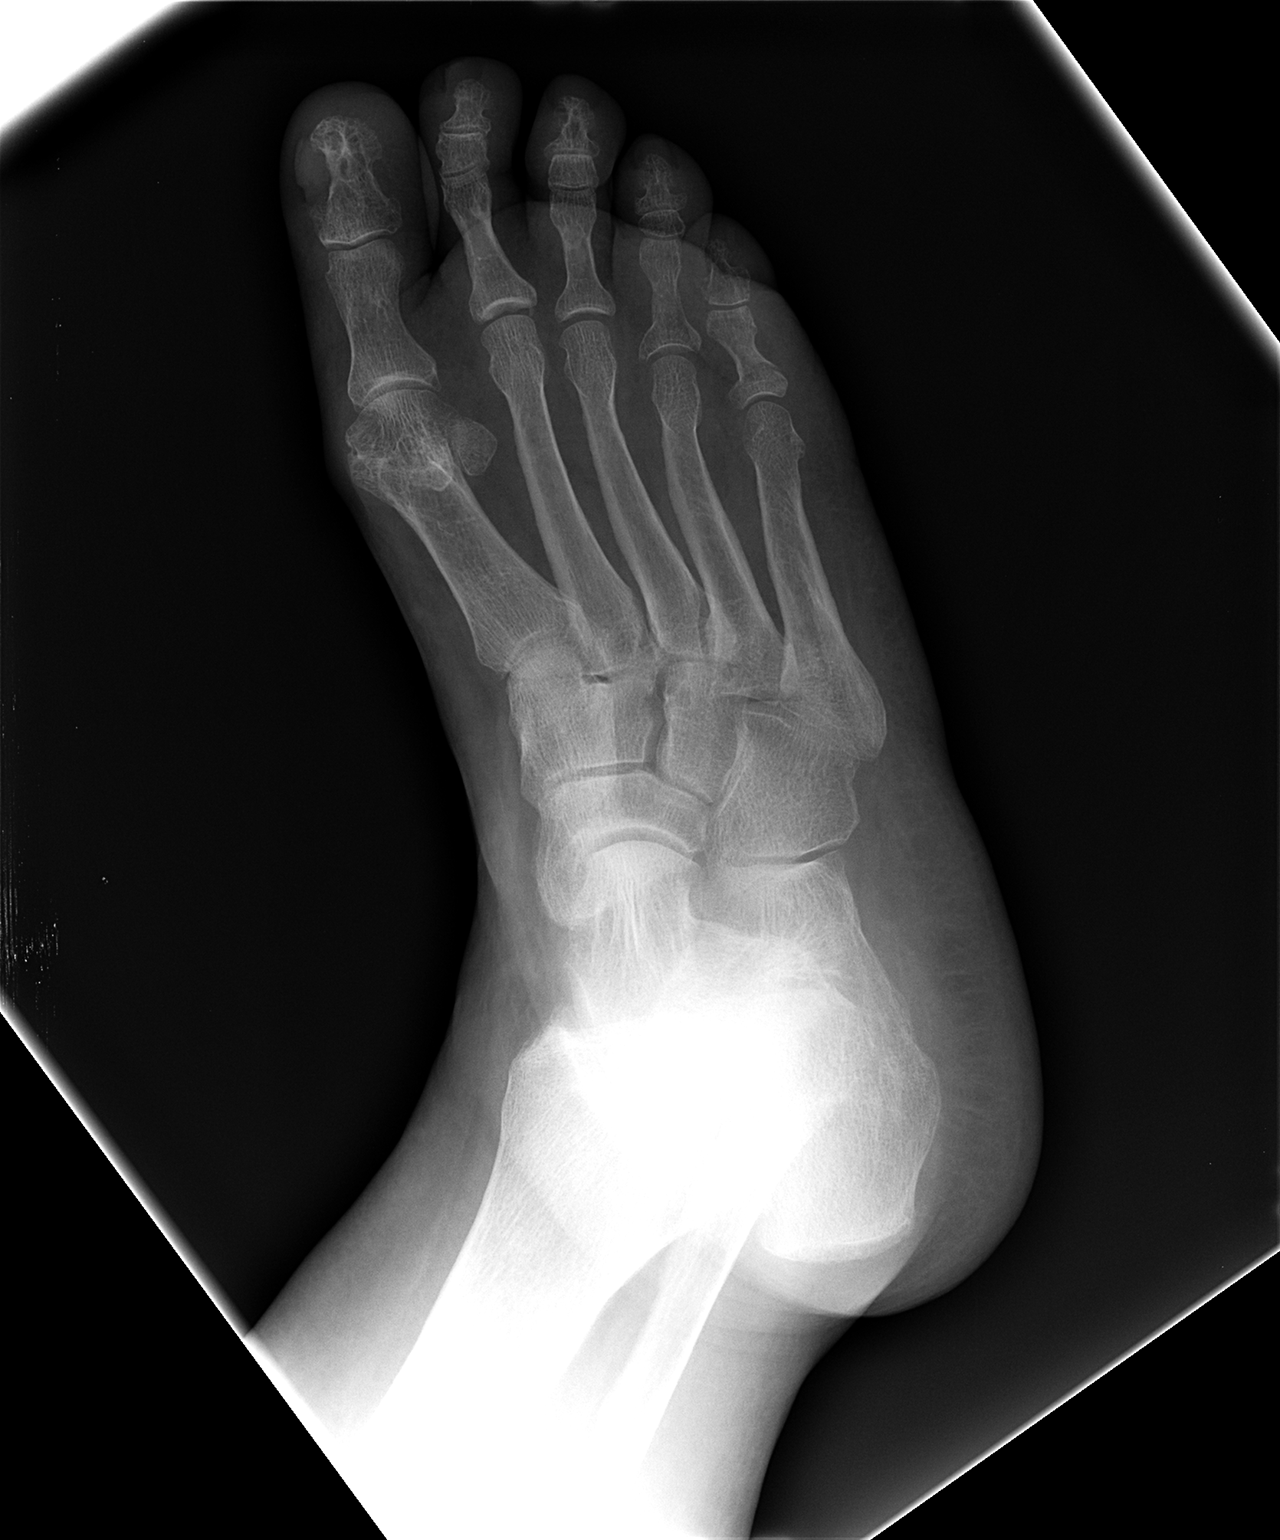

[view not recorded (3 of 3)]
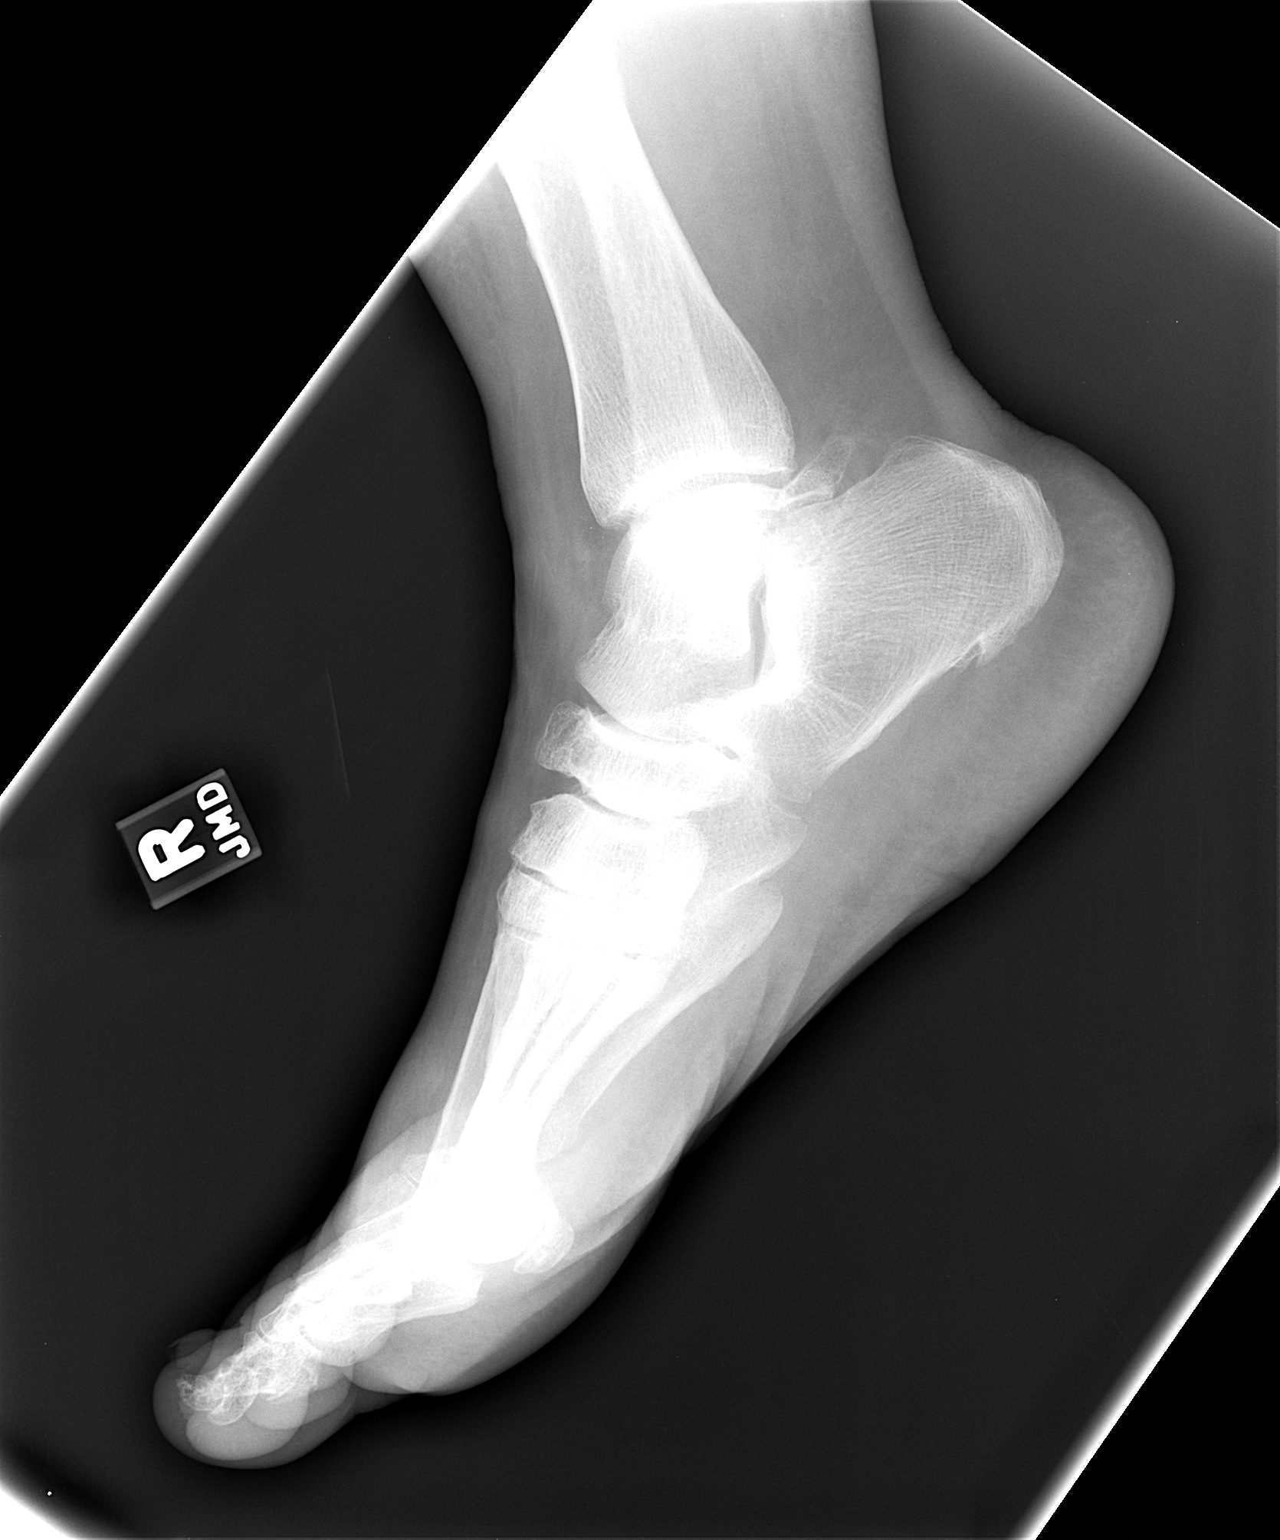

[3 of 3 positions shown; findings below may reference images not displayed]

FINDINGS: There are nondisplaced fractures involving the proximal
aspects of the fourth and fifth middle phalanges.  No dislocation.
IMPRESSION: Nondisplaced fractures of the proximal aspects of both the fourth
and fifth middle phalanges.

## 2011-09-14 ENCOUNTER — Emergency Department: Payer: Self-pay | Admitting: Emergency Medicine

## 2011-09-14 LAB — COMPREHENSIVE METABOLIC PANEL
Albumin: 3.5 g/dL (ref 3.4–5.0)
Anion Gap: 14 (ref 7–16)
BUN: 43 mg/dL — ABNORMAL HIGH (ref 7–18)
Bilirubin,Total: 0.5 mg/dL (ref 0.2–1.0)
Calcium, Total: 9.4 mg/dL (ref 8.5–10.1)
Chloride: 95 mmol/L — ABNORMAL LOW (ref 98–107)
Co2: 31 mmol/L (ref 21–32)
Creatinine: 6.66 mg/dL — ABNORMAL HIGH (ref 0.60–1.30)
Osmolality: 291 (ref 275–301)
Potassium: 4.5 mmol/L (ref 3.5–5.1)
SGOT(AST): 32 U/L (ref 15–37)
Sodium: 140 mmol/L (ref 136–145)
Total Protein: 7.6 g/dL (ref 6.4–8.2)

## 2011-09-14 LAB — CBC WITH DIFFERENTIAL/PLATELET
Basophil #: 0 10*3/uL (ref 0.0–0.1)
Basophil %: 0.4 %
Eosinophil %: 3.5 %
HCT: 32.8 % — ABNORMAL LOW (ref 35.0–47.0)
HGB: 10.6 g/dL — ABNORMAL LOW (ref 12.0–16.0)
MCH: 33.3 pg (ref 26.0–34.0)
MCHC: 32.4 g/dL (ref 32.0–36.0)
MCV: 103 fL — ABNORMAL HIGH (ref 80–100)
Monocyte #: 0.2 10*3/uL (ref 0.0–0.7)
Neutrophil #: 2.1 10*3/uL (ref 1.4–6.5)
Neutrophil %: 61.4 %

## 2011-09-15 ENCOUNTER — Inpatient Hospital Stay: Payer: Self-pay | Admitting: Internal Medicine

## 2011-09-15 LAB — COMPREHENSIVE METABOLIC PANEL
Alkaline Phosphatase: 185 U/L — ABNORMAL HIGH (ref 50–136)
BUN: 20 mg/dL — ABNORMAL HIGH (ref 7–18)
Bilirubin,Total: 0.4 mg/dL (ref 0.2–1.0)
Chloride: 97 mmol/L — ABNORMAL LOW (ref 98–107)
Co2: 34 mmol/L — ABNORMAL HIGH (ref 21–32)
Creatinine: 3.75 mg/dL — ABNORMAL HIGH (ref 0.60–1.30)
EGFR (African American): 16 — ABNORMAL LOW
EGFR (Non-African Amer.): 13 — ABNORMAL LOW
SGOT(AST): 19 U/L (ref 15–37)
SGPT (ALT): 21 U/L

## 2011-09-15 LAB — CBC WITH DIFFERENTIAL/PLATELET
Basophil #: 0 10*3/uL (ref 0.0–0.1)
Eosinophil #: 0.1 10*3/uL (ref 0.0–0.7)
HGB: 10.7 g/dL — ABNORMAL LOW (ref 12.0–16.0)
Lymphocyte #: 0.5 10*3/uL — ABNORMAL LOW (ref 1.0–3.6)
MCH: 33.7 pg (ref 26.0–34.0)
MCV: 103 fL — ABNORMAL HIGH (ref 80–100)
Monocyte #: 0.3 10*3/uL (ref 0.0–0.7)
Platelet: 126 10*3/uL — ABNORMAL LOW (ref 150–440)
RDW: 19.5 % — ABNORMAL HIGH (ref 11.5–14.5)

## 2011-09-15 LAB — TROPONIN I: Troponin-I: 0.04 ng/mL

## 2011-09-15 LAB — MAGNESIUM: Magnesium: 2 mg/dL

## 2011-09-16 LAB — BASIC METABOLIC PANEL
BUN: 28 mg/dL — ABNORMAL HIGH (ref 7–18)
Calcium, Total: 9 mg/dL (ref 8.5–10.1)
EGFR (African American): 12 — ABNORMAL LOW
EGFR (Non-African Amer.): 10 — ABNORMAL LOW
Glucose: 91 mg/dL (ref 65–99)
Osmolality: 281 (ref 275–301)
Potassium: 3.9 mmol/L (ref 3.5–5.1)
Sodium: 138 mmol/L (ref 136–145)

## 2011-09-16 LAB — CBC WITH DIFFERENTIAL/PLATELET
Basophil %: 0.2 %
Eosinophil #: 0.1 10*3/uL (ref 0.0–0.7)
Eosinophil %: 3.9 %
HGB: 10.3 g/dL — ABNORMAL LOW (ref 12.0–16.0)
Lymphocyte #: 0.9 10*3/uL — ABNORMAL LOW (ref 1.0–3.6)
MCH: 33.7 pg (ref 26.0–34.0)
MCHC: 32.8 g/dL (ref 32.0–36.0)
MCV: 103 fL — ABNORMAL HIGH (ref 80–100)
Monocyte #: 0.2 10*3/uL (ref 0.0–0.7)
Platelet: 125 10*3/uL — ABNORMAL LOW (ref 150–440)
RBC: 3.05 10*6/uL — ABNORMAL LOW (ref 3.80–5.20)
WBC: 3 10*3/uL — ABNORMAL LOW (ref 3.6–11.0)

## 2011-09-16 LAB — PHENOBARBITAL LEVEL: Phenobarbital: 3.8 ug/mL — ABNORMAL LOW (ref 15.0–40.0)

## 2011-09-17 LAB — RENAL FUNCTION PANEL
BUN: 46 mg/dL — ABNORMAL HIGH (ref 7–18)
EGFR (African American): 8 — ABNORMAL LOW
Glucose: 90 mg/dL (ref 65–99)
Osmolality: 289 (ref 275–301)
Phosphorus: 3.9 mg/dL (ref 2.5–4.9)
Potassium: 4.2 mmol/L (ref 3.5–5.1)
Sodium: 139 mmol/L (ref 136–145)

## 2011-09-17 LAB — CBC WITH DIFFERENTIAL/PLATELET
Basophil %: 0.3 %
HCT: 32.5 % — ABNORMAL LOW (ref 35.0–47.0)
HGB: 10.4 g/dL — ABNORMAL LOW (ref 12.0–16.0)
Lymphocyte #: 1 10*3/uL (ref 1.0–3.6)
Lymphocyte %: 25.3 %
MCV: 104 fL — ABNORMAL HIGH (ref 80–100)
Monocyte #: 0.2 10*3/uL (ref 0.0–0.7)
Monocyte %: 6.1 %
Neutrophil #: 2.5 10*3/uL (ref 1.4–6.5)
Platelet: 123 10*3/uL — ABNORMAL LOW (ref 150–440)
RDW: 19.4 % — ABNORMAL HIGH (ref 11.5–14.5)
WBC: 3.9 10*3/uL (ref 3.6–11.0)

## 2011-09-20 IMAGING — US IR US GUIDE VASC ACCESS LEFT
1 series · 1 of 1 positions shown · non-contrast
Comparison: none

CLINICAL DATA: History of end-stage renal disease.  Occluded HeRO
graft to the right upper extremity.  Graft declot procedure was
cancelled due to elevated potassium of greater than 7.5.  Emergent
non tunneled hemodialysis catheter placement was performed at the
bedside with ultrasound guidance during emergent resuscitation of
the patient.

[Series 1: ir us guide vasc access left · 1 of 1 slices shown]
[im 1/1]
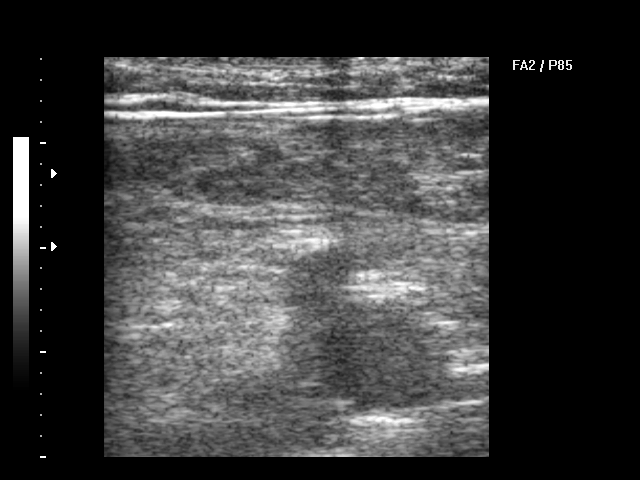

[1 of 1 positions shown; findings below may reference images not displayed]

NON-TUNNELED CENTRAL VENOUS HEMODIALYSIS CATHETER PLACEMENT WITH
ULTRASOUND GUIDANCE

Procedure:  The procedure, risks, benefits, and alternatives were
explained to the patient.  Questions regarding the procedure were
encouraged and answered.  The patient understands and consents to
the procedure.

The left groin was prepped with chlorhexidine in a sterile fashion,
and a sterile drape was applied covering the operative field.
Maximum barrier sterile technique with sterile gowns and gloves
were used for the procedure.  Local anesthesia was provided with 1%
lidocaine.

After creating a small venotomy incision, a 19 gauge needle was
advanced into the left common femoral vein under direct, real-time
ultrasound guidance.  Ultrasound image documentation was performed.
After securing guidewire access, access was dilated.

A 24 cm nontunneled hemodialysis catheter was chosen for placement.
This was placed over a guide wire.

The catheter was aspirated and flushed with saline.  The catheter
exit site was secured with 0-Prolene retention sutures.

Complications: None.  No pneumothorax.
FINDINGS: The catheter was placed without difficulty.  The
catheter aspirates normally and is ready for use.
IMPRESSION: Placement of nontunneled hemodialysis catheter via the left common
femoral vein.  Catheter placement was performed at the bedside in
an emergent fashion under only ultrasound guidance.  The catheter
is ready for immediate use.

## 2011-09-21 IMAGING — CR DG CHEST 1V PORT
1 series · 1 of 1 positions shown · non-contrast
Comparison: 09/19/2009.

CLINICAL DATA: Clotted graft.

PORTABLE CHEST - 1 VIEW

[AP]
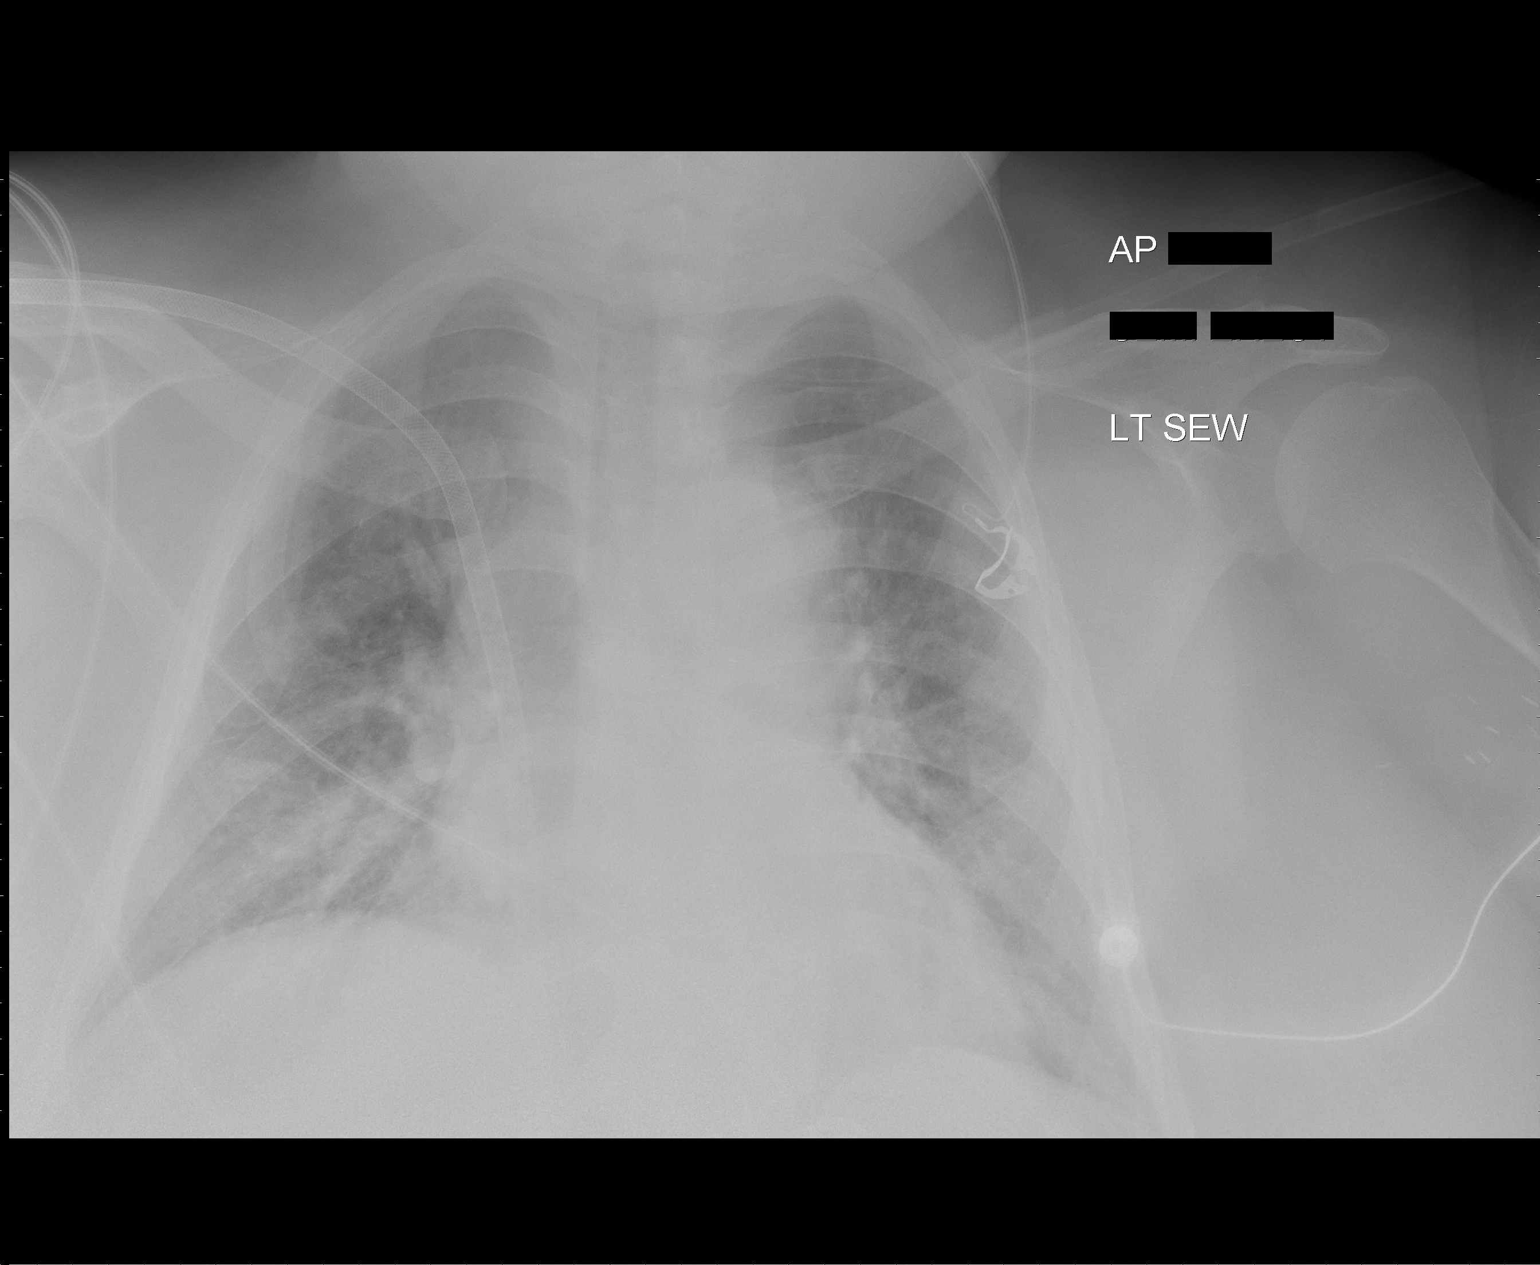

[1 of 1 positions shown; findings below may reference images not displayed]

FINDINGS: Compared yesterday, there is worsening pulmonary
aeration.  Vascular graft material overlying the right hemithorax
is unchanged.  There is increasing bilateral basilar atelectasis,
interstitial and mild alveolar pulmonary edema.  Pulmonary vascular
congestion is present.  Lung volumes are lower, accentuating the
size of the cardiopericardial silhouette.
IMPRESSION: Worsening aeration, with lower volumes and interstitial pulmonary
edema.

## 2011-09-22 IMAGING — CR DG CHEST 1V PORT
1 series · 1 of 1 positions shown · non-contrast
Comparison: 09/20/2009.

CLINICAL DATA: Clotted graft.  Respiratory distress.

PORTABLE CHEST - 1 VIEW

[AP]
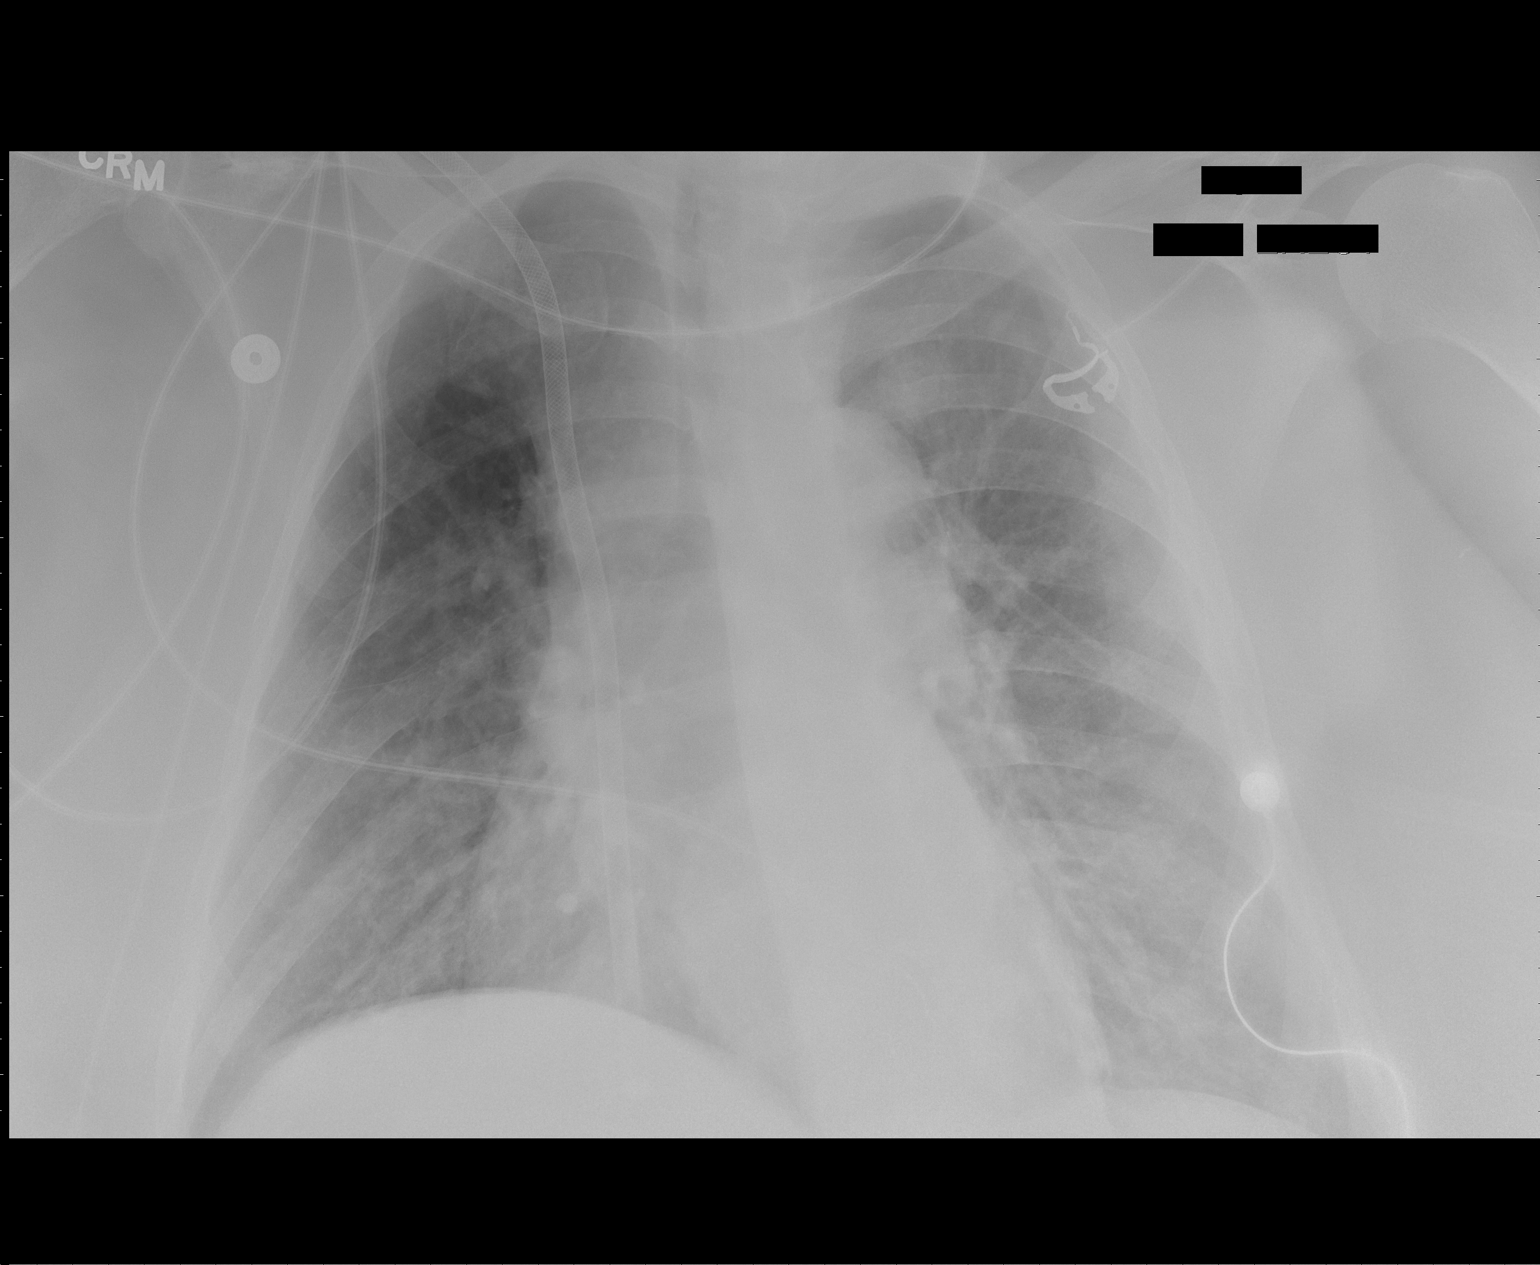

[1 of 1 positions shown; findings below may reference images not displayed]

FINDINGS: Right upper extremity dialysis graft is again noted.
Cardiopericardial silhouette remains upper limits of normal for
size.  Tortuous thoracic aorta.  The patient is again rotated.

Compared yesterday, aeration of the lungs has improved.
Interstitial edema appears to have resolved.  Left basilar
atelectasis remains present.
IMPRESSION: Improved aeration without other interval changes.

## 2011-09-23 IMAGING — CR DG SHOULDER 2+V*L*
4 series · 4 of 4 positions shown · non-contrast
Comparison: 07/08/2010

CLINICAL DATA: Left shoulder pain.  Recent fall.

LEFT SHOULDER - 2+ VIEW

[view not recorded (1 of 4)]
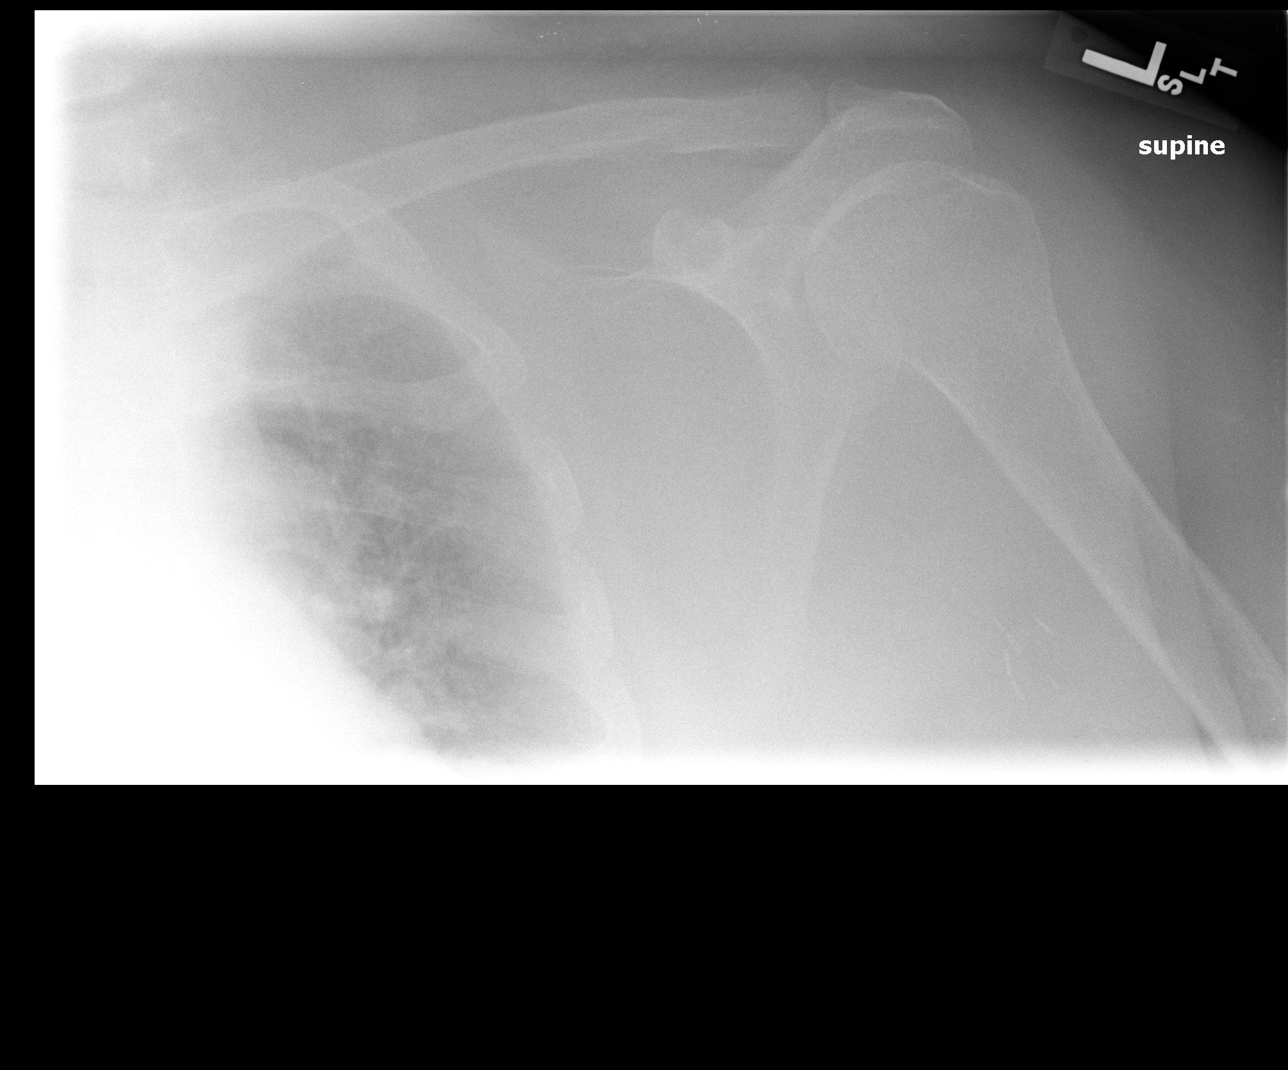

[view not recorded (2 of 4)]
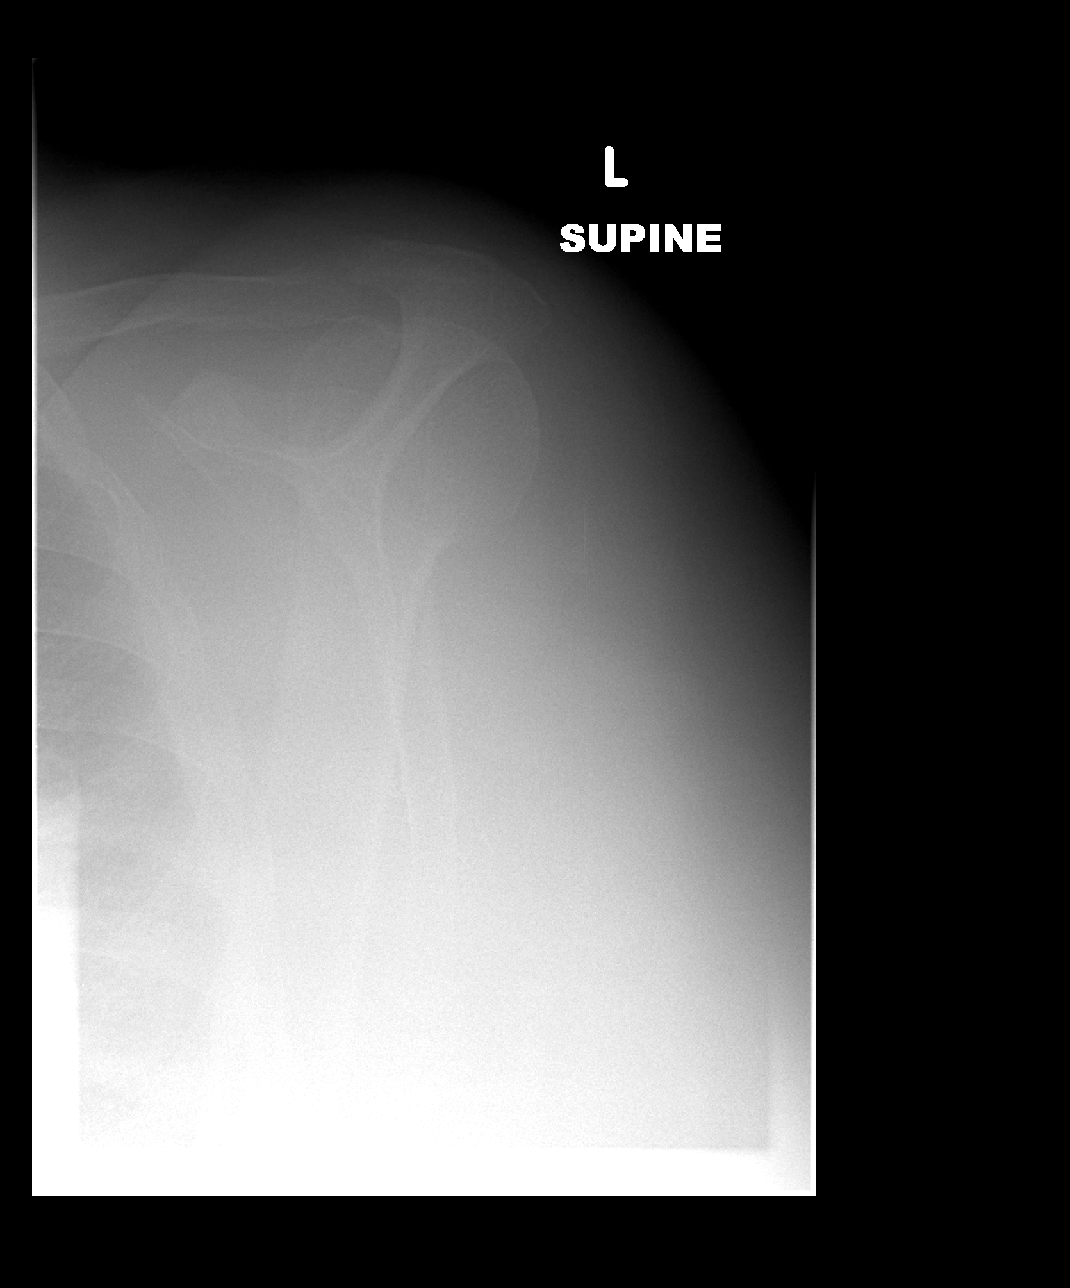

[view not recorded (3 of 4)]
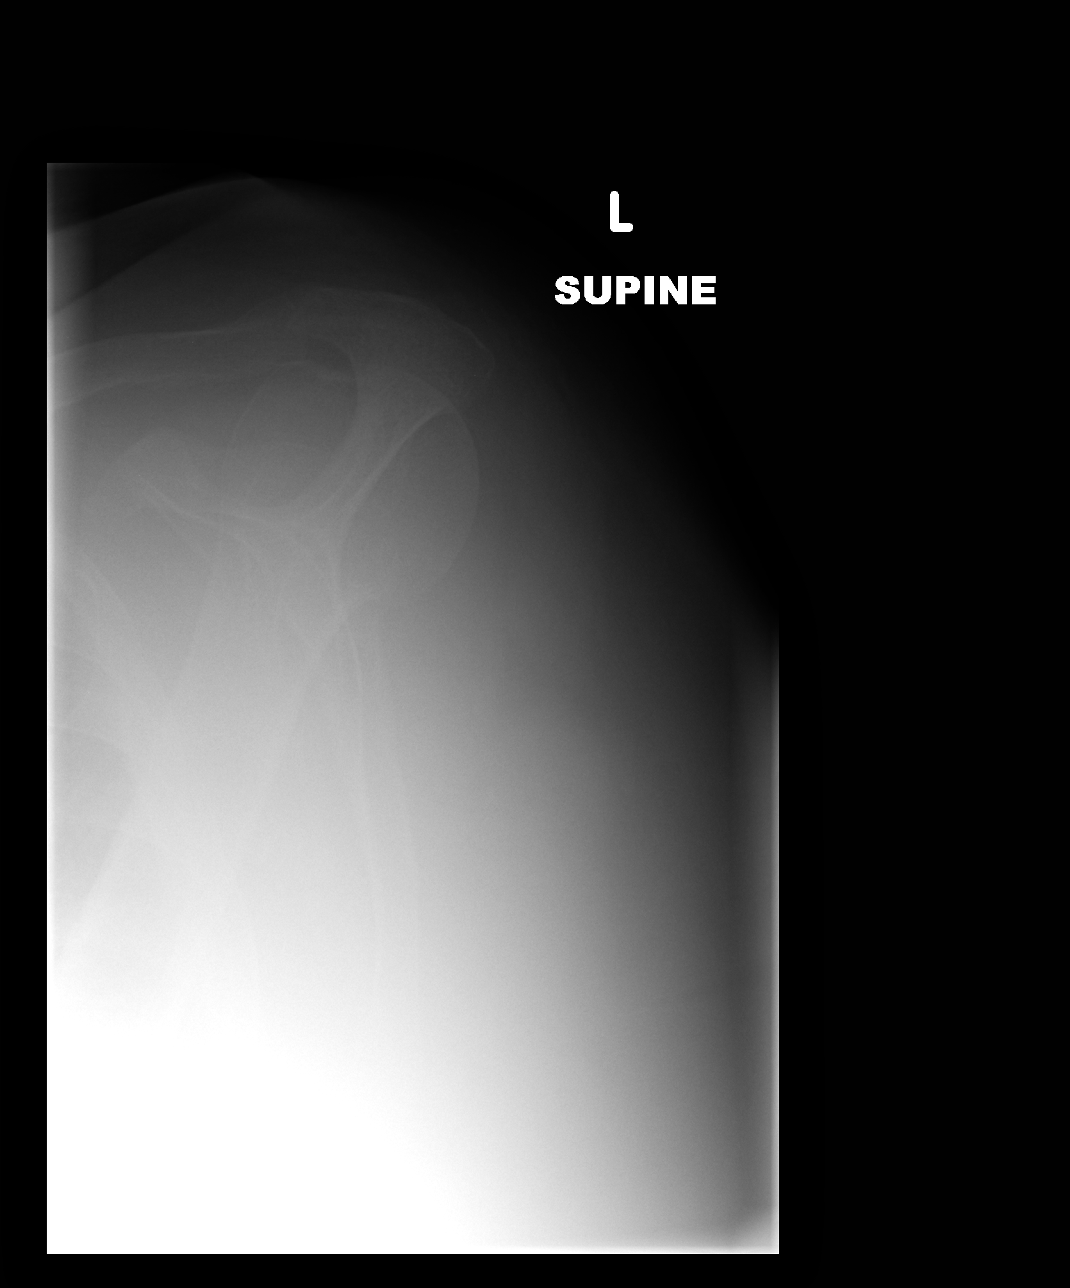

[view not recorded (4 of 4)]
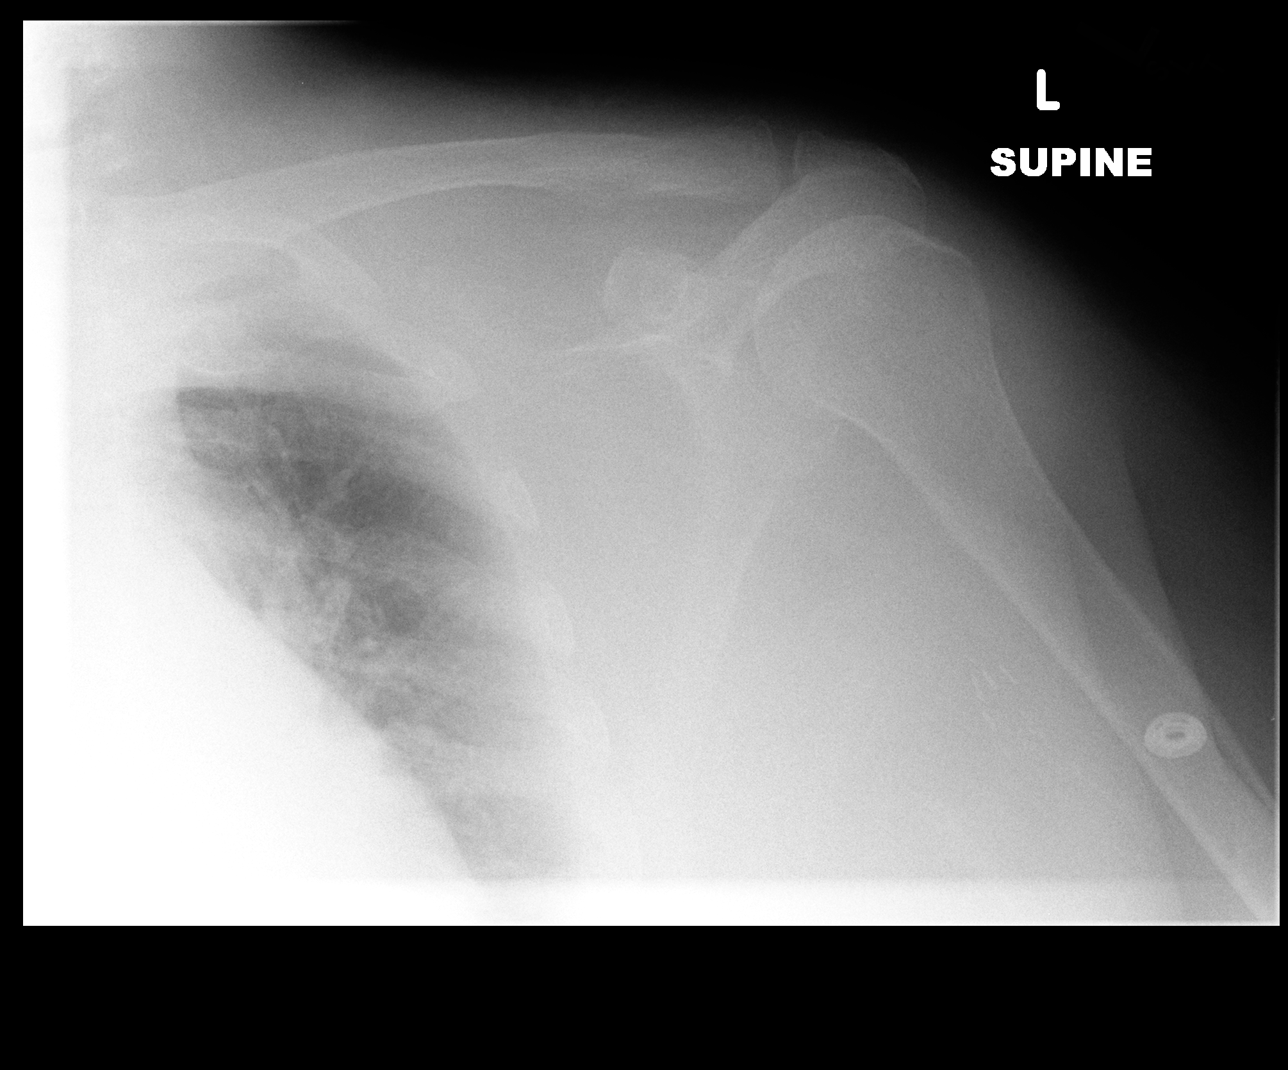

[4 of 4 positions shown; findings below may reference images not displayed]

FINDINGS: There is no dislocation or fracture.

There is no radio-opaque foreign body or soft tissue
calcifications.

No significant arthropathy.
IMPRESSION: 1.  No acute findings.

## 2011-09-23 IMAGING — US IR AV DIALYSIS GRAFT DECLOT
1 series · 1 of 1 positions shown · non-contrast
Comparison: none

CLINICAL DATA: ESRD, occluded MOTA access

[Series 1: ir av dialysis graft declot · 1 of 1 slices shown]
[im 1/1]
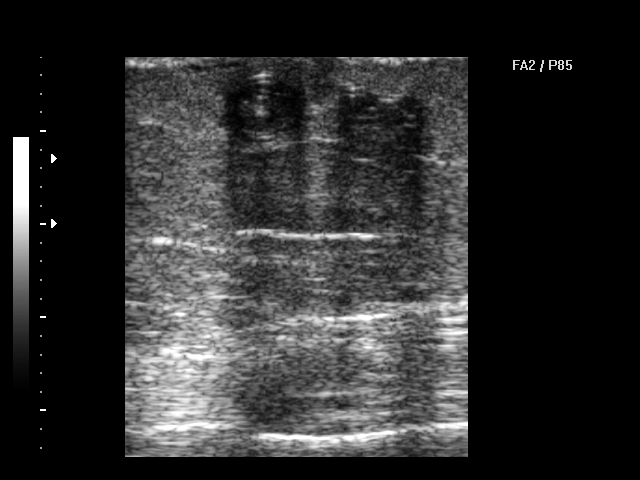

[1 of 1 positions shown; findings below may reference images not displayed]

ULTRASOUND GUIDANCE FOR VASCULAR ACCESS
RIGHT UPPER ARM AV GRAFT THROMBOLYSIS/THROMBECTOMY (MOTA ACCESS)
7 MM VENOUS ANGIOPLASTY

Date:  09/22/2009 [DATE]

Radiologist:  Torge Todorov, M.D.

Medications:  2 mg TPA, 3999 units heparin, 25 mcg Fentanyl, 0.5 mg
Versed

Guidance:  Ultrasound and fluoroscopic

Fluoroscopy time:  5.1 minutes

Sedation time:  25 minutes

Contrast volume:  40 ml [id number]

Complications:  No immediate

PROCEDURE/FINDINGS:

Informed consent was obtained from the patient following
explanation of the procedure, risks, benefits and alternatives.
The patient understands, agrees and consents for the procedure.
All questions were addressed.  A time out was performed.

Maximal barrier sterile technique utilized including caps, mask,
sterile gowns, sterile gloves, large sterile drape, hand hygiene,
and betadine

Under sterile conditions and local anesthesia, micropuncture access
was performed of the right upper arm AV graft with ultrasound.  Two
6-French sheaths were inserted.  Contrast injection confirms
thrombosis of the access.  A catheter and guide wire were advanced
into the Tai Keung access centrally.  Venous drainage extends to the
right atrial IVC junction.  Pullback venogram confirms static flow
in the venous outflow.  Thrombus present in the graft portion of
the access.  2 mg TPA was instilled for thrombolysis.  Mechanical
thrombectomy was performed with the AngioJet device, total run
volume 250 ml heparinized saline.  Repeat injection demonstrates no
significant residual large clot burden.  Overlapping 7 mm
angioplasty was performed throughout the majority of the AV graft
portion.  No significant residual stenosis identified.  Flow
remains stagnant.  To reestablish inflow, a 5.5 French Fogarty
catheter over a glide wire was advanced across the arterial
anastomosis.  Embolectomy was performed of the arterial plug twice.
Both sheaths were back bled and syringe aspirated.  Injection
demonstrates residual intragraft thrombus.  This was treated with
additional mechanical thrombectomy with the AngioJet device and
balloon angioplasty.  Majority of clot was removed.

Completion shuntogram performed.

Shuntogram:  Right upper arm Tai Keung access is widely patent.  No
residual stenosis or thrombus.  Venous outflow is widely patent
with brisk flow.

The sheaths were removed.  Hemostasis was obtained with pursestring
sutures.  No immediate complication.  The patient tolerated the
procedure well.
IMPRESSION: Right upper arm AV graft (Tai Keung access) thrombolysis, thrombectomy
and venous angioplasty to restore flow.  Access ready for use.

Access management:  This site remains amenable to intervention.

## 2011-09-25 ENCOUNTER — Ambulatory Visit: Payer: Medicare Other | Admitting: Internal Medicine

## 2011-10-15 ENCOUNTER — Ambulatory Visit: Payer: Self-pay | Admitting: Internal Medicine

## 2011-10-31 ENCOUNTER — Other Ambulatory Visit: Payer: Self-pay | Admitting: *Deleted

## 2011-10-31 MED ORDER — FLUTICASONE PROPIONATE 50 MCG/ACT NA SUSP: 2.0000 | Freq: Every day | NASAL | Status: DC

## 2011-11-02 ENCOUNTER — Inpatient Hospital Stay: Payer: Self-pay | Admitting: Internal Medicine

## 2011-11-02 LAB — CBC
HCT: 30.5 % — ABNORMAL LOW (ref 35.0–47.0)
HGB: 9.8 g/dL — ABNORMAL LOW (ref 12.0–16.0)
MCH: 32.8 pg (ref 26.0–34.0)
Platelet: 65 10*3/uL — ABNORMAL LOW (ref 150–440)
RBC: 2.99 10*6/uL — ABNORMAL LOW (ref 3.80–5.20)
RDW: 16.7 % — ABNORMAL HIGH (ref 11.5–14.5)
WBC: 5.5 10*3/uL (ref 3.6–11.0)

## 2011-11-02 LAB — BASIC METABOLIC PANEL
Anion Gap: 11 (ref 7–16)
Chloride: 94 mmol/L — ABNORMAL LOW (ref 98–107)
Co2: 28 mmol/L (ref 21–32)
Creatinine: 6.14 mg/dL — ABNORMAL HIGH (ref 0.60–1.30)
EGFR (Non-African Amer.): 7 — ABNORMAL LOW
Glucose: 94 mg/dL (ref 65–99)
Osmolality: 277 (ref 275–301)
Sodium: 133 mmol/L — ABNORMAL LOW (ref 136–145)

## 2011-11-02 LAB — CK TOTAL AND CKMB (NOT AT ARMC)
CK, Total: 453 U/L — ABNORMAL HIGH (ref 21–215)
CK-MB: 0.8 ng/mL (ref 0.5–3.6)

## 2011-11-02 LAB — PROTIME-INR: Prothrombin Time: 16.9 secs — ABNORMAL HIGH (ref 11.5–14.7)

## 2011-11-03 LAB — BASIC METABOLIC PANEL
Anion Gap: 13 (ref 7–16)
BUN: 50 mg/dL — ABNORMAL HIGH (ref 7–18)
Potassium: 5 mmol/L (ref 3.5–5.1)
Sodium: 132 mmol/L — ABNORMAL LOW (ref 136–145)

## 2011-11-03 LAB — TROPONIN I: Troponin-I: 0.25 ng/mL — ABNORMAL HIGH

## 2011-11-03 LAB — CBC WITH DIFFERENTIAL/PLATELET
Eosinophil %: 0.1 %
HCT: 30.3 % — ABNORMAL LOW (ref 35.0–47.0)
HGB: 9.7 g/dL — ABNORMAL LOW (ref 12.0–16.0)
Lymphocyte %: 4.9 %
MCHC: 31.9 g/dL — ABNORMAL LOW (ref 32.0–36.0)
MCV: 103 fL — ABNORMAL HIGH (ref 80–100)
Monocyte #: 0.3 x10 3/mm (ref 0.2–0.9)
Neutrophil #: 4.9 10*3/uL (ref 1.4–6.5)
RBC: 2.95 10*6/uL — ABNORMAL LOW (ref 3.80–5.20)
WBC: 5.5 10*3/uL (ref 3.6–11.0)

## 2011-11-03 LAB — PHOSPHORUS: Phosphorus: 2.9 mg/dL (ref 2.5–4.9)

## 2011-11-03 LAB — LIPID PANEL
HDL Cholesterol: 13 mg/dL — ABNORMAL LOW (ref 40–60)
Ldl Cholesterol, Calc: 44 mg/dL (ref 0–100)
VLDL Cholesterol, Calc: 34 mg/dL (ref 5–40)

## 2011-11-03 LAB — CK TOTAL AND CKMB (NOT AT ARMC): CK-MB: <0.5 ng/mL — ABNORMAL LOW (ref 0.5–3.6)

## 2011-11-04 LAB — CBC WITH DIFFERENTIAL/PLATELET
Basophil #: 0 10*3/uL (ref 0.0–0.1)
HGB: 9.2 g/dL — ABNORMAL LOW (ref 12.0–16.0)
MCHC: 32.1 g/dL (ref 32.0–36.0)
MCV: 102 fL — ABNORMAL HIGH (ref 80–100)
Neutrophil #: 3.6 10*3/uL (ref 1.4–6.5)
RBC: 2.8 10*6/uL — ABNORMAL LOW (ref 3.80–5.20)
WBC: 4.5 10*3/uL (ref 3.6–11.0)

## 2011-11-04 LAB — PROTIME-INR: INR: 1.1

## 2011-11-05 ENCOUNTER — Other Ambulatory Visit: Payer: Self-pay | Admitting: *Deleted

## 2011-11-05 LAB — CBC WITH DIFFERENTIAL/PLATELET
Basophil #: 0 10*3/uL (ref 0.0–0.1)
Eosinophil %: 0.7 %
Lymphocyte #: 0.5 10*3/uL — ABNORMAL LOW (ref 1.0–3.6)
Lymphocyte %: 7.2 %
Monocyte #: 0.4 x10 3/mm (ref 0.2–0.9)
Neutrophil %: 84.9 %
Platelet: 59 10*3/uL — ABNORMAL LOW (ref 150–440)
RBC: 2.92 10*6/uL — ABNORMAL LOW (ref 3.80–5.20)
RDW: 16.5 % — ABNORMAL HIGH (ref 11.5–14.5)

## 2011-11-05 LAB — CULTURE, BLOOD (SINGLE)

## 2011-11-05 LAB — BASIC METABOLIC PANEL
Anion Gap: 10 (ref 7–16)
BUN: 47 mg/dL — ABNORMAL HIGH (ref 7–18)
Calcium, Total: 9.1 mg/dL (ref 8.5–10.1)
Chloride: 93 mmol/L — ABNORMAL LOW (ref 98–107)
Co2: 29 mmol/L (ref 21–32)
Creatinine: 6.24 mg/dL — ABNORMAL HIGH (ref 0.60–1.30)
EGFR (African American): 8 — ABNORMAL LOW
EGFR (Non-African Amer.): 7 — ABNORMAL LOW
Osmolality: 277 (ref 275–301)

## 2011-11-05 LAB — PLATELET COUNT: Platelet: 65 10*3/uL — ABNORMAL LOW (ref 150–440)

## 2011-11-05 MED ORDER — PANTOPRAZOLE SODIUM 40 MG PO TBEC: 40.0000 mg | DELAYED_RELEASE_TABLET | Freq: Every day | ORAL | Status: DC

## 2011-11-06 DIAGNOSIS — R079 Chest pain, unspecified: Secondary | ICD-10-CM

## 2011-11-06 DIAGNOSIS — I428 Other cardiomyopathies: Secondary | ICD-10-CM

## 2011-11-06 LAB — CBC WITH DIFFERENTIAL/PLATELET
Eosinophil #: 0.3 10*3/uL (ref 0.0–0.7)
HCT: 32.5 % — ABNORMAL LOW (ref 35.0–47.0)
HGB: 10.5 g/dL — ABNORMAL LOW (ref 12.0–16.0)
Lymphocyte #: 0.7 10*3/uL — ABNORMAL LOW (ref 1.0–3.6)
Lymphocyte %: 9.1 %
MCH: 32.9 pg (ref 26.0–34.0)
MCV: 102 fL — ABNORMAL HIGH (ref 80–100)
Monocyte #: 0.4 x10 3/mm (ref 0.2–0.9)
Neutrophil %: 80.6 %
Platelet: 86 10*3/uL — ABNORMAL LOW (ref 150–440)
RBC: 3.18 10*6/uL — ABNORMAL LOW (ref 3.80–5.20)
RDW: 16.7 % — ABNORMAL HIGH (ref 11.5–14.5)
WBC: 7.4 10*3/uL (ref 3.6–11.0)

## 2011-11-06 LAB — BASIC METABOLIC PANEL
Anion Gap: 10 (ref 7–16)
BUN: 60 mg/dL — ABNORMAL HIGH (ref 7–18)
Chloride: 92 mmol/L — ABNORMAL LOW (ref 98–107)
Co2: 29 mmol/L (ref 21–32)
Creatinine: 7.49 mg/dL — ABNORMAL HIGH (ref 0.60–1.30)
EGFR (African American): 7 — ABNORMAL LOW
EGFR (Non-African Amer.): 6 — ABNORMAL LOW
Osmolality: 280 (ref 275–301)
Potassium: 4.8 mmol/L (ref 3.5–5.1)

## 2011-11-07 DIAGNOSIS — I5021 Acute systolic (congestive) heart failure: Secondary | ICD-10-CM

## 2011-11-08 LAB — CBC WITH DIFFERENTIAL/PLATELET
Eosinophil #: 0 10*3/uL (ref 0.0–0.7)
Eosinophil %: 0.2 %
HCT: 30.2 % — ABNORMAL LOW (ref 35.0–47.0)
Lymphocyte #: 0.5 10*3/uL — ABNORMAL LOW (ref 1.0–3.6)
Lymphocyte %: 7.1 %
MCH: 33.2 pg (ref 26.0–34.0)
MCV: 101 fL — ABNORMAL HIGH (ref 80–100)
Monocyte #: 0.3 x10 3/mm (ref 0.2–0.9)
Neutrophil #: 5.6 10*3/uL (ref 1.4–6.5)
Platelet: 134 10*3/uL — ABNORMAL LOW (ref 150–440)
RBC: 2.99 10*6/uL — ABNORMAL LOW (ref 3.80–5.20)
RDW: 16.5 % — ABNORMAL HIGH (ref 11.5–14.5)
WBC: 6.5 10*3/uL (ref 3.6–11.0)

## 2011-11-08 LAB — BASIC METABOLIC PANEL
Anion Gap: 8 (ref 7–16)
BUN: 49 mg/dL — ABNORMAL HIGH (ref 7–18)
Calcium, Total: 8.8 mg/dL (ref 8.5–10.1)
Co2: 28 mmol/L (ref 21–32)
EGFR (Non-African Amer.): 7 — ABNORMAL LOW
Glucose: 118 mg/dL — ABNORMAL HIGH (ref 65–99)
Osmolality: 275 (ref 275–301)
Potassium: 4.7 mmol/L (ref 3.5–5.1)

## 2011-11-08 IMAGING — CT CT HEAD W/O CM
1 series · 16 of 28 positions shown, 20 images · non-contrast
Comparison: 08/26/2009

CLINICAL DATA: Syncope.

CT HEAD WITHOUT CONTRAST
TECHNIQUE: Contiguous axial images were obtained from the base of
the skull through the vertex without contrast.

[Series 2: brain · axial · 0.47mm/px · z∈[+119,+254]mm · 16 of 28 slices shown, 20 images]
[im 2/28  brain]
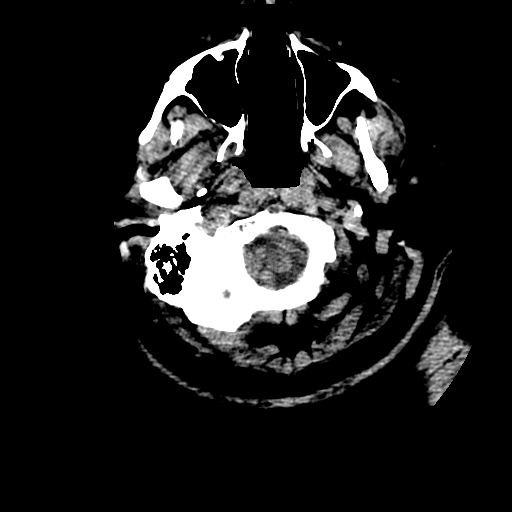
[im 2/28  bone]
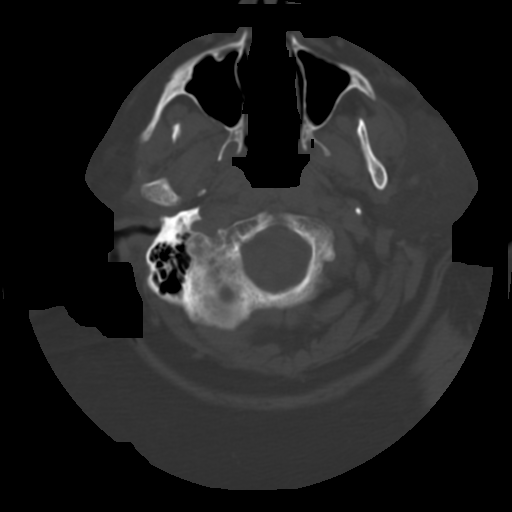
[im 4/28  brain]
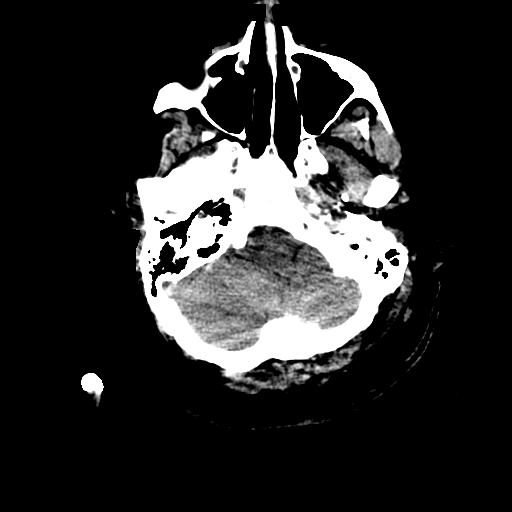
[im 6/28  brain]
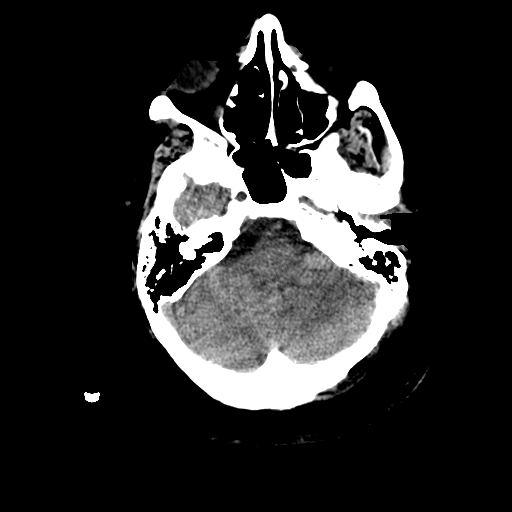
[im 7/28  brain]
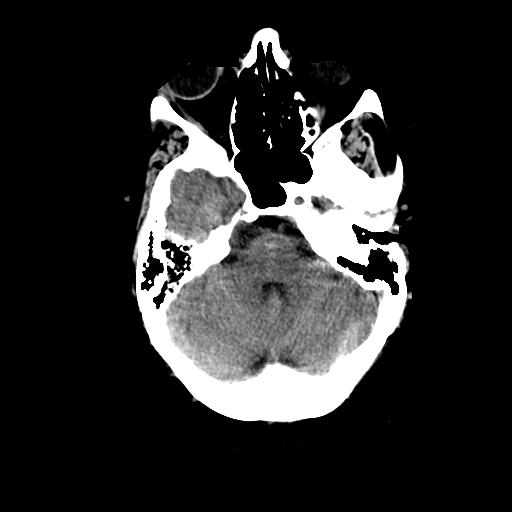
[im 9/28  brain]
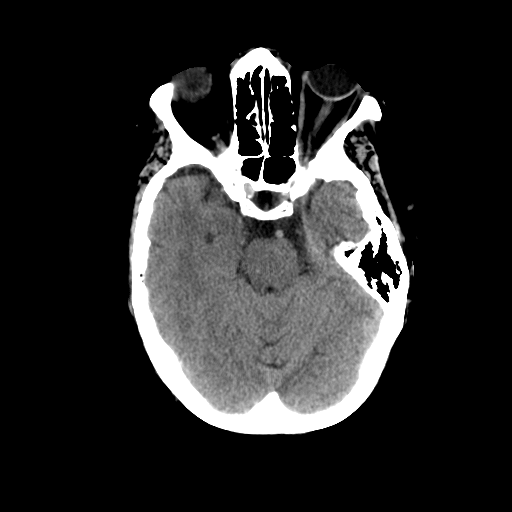
[im 9/28  bone]
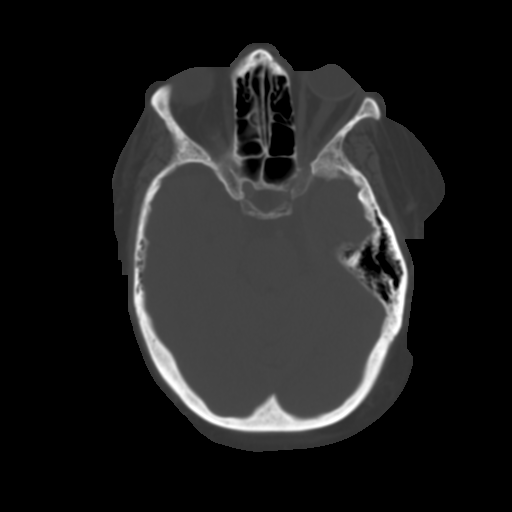
[im 10/28  brain]
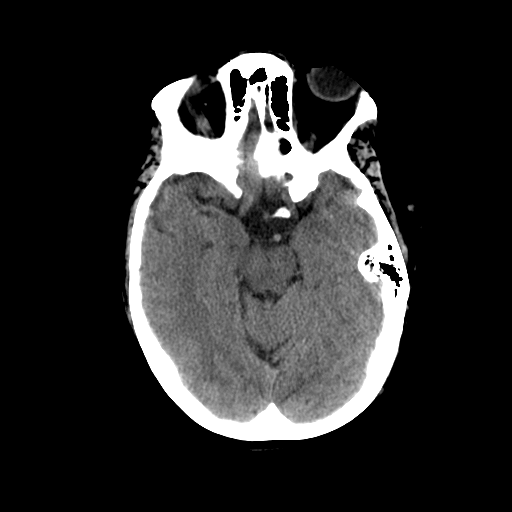
[im 12/28  brain]
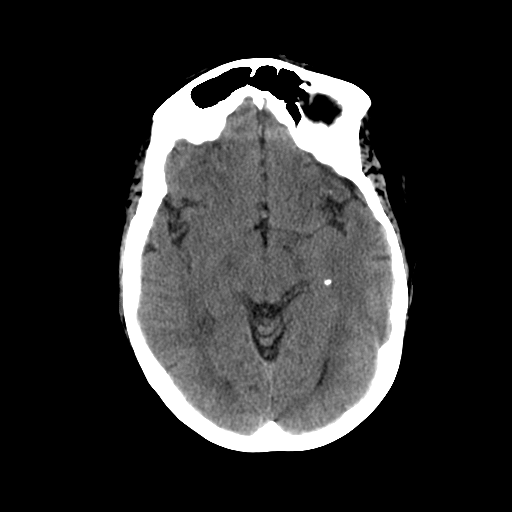
[im 14/28  brain]
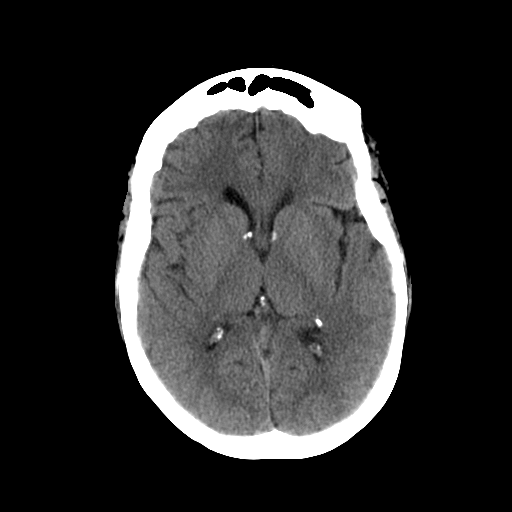
[im 15/28  brain]
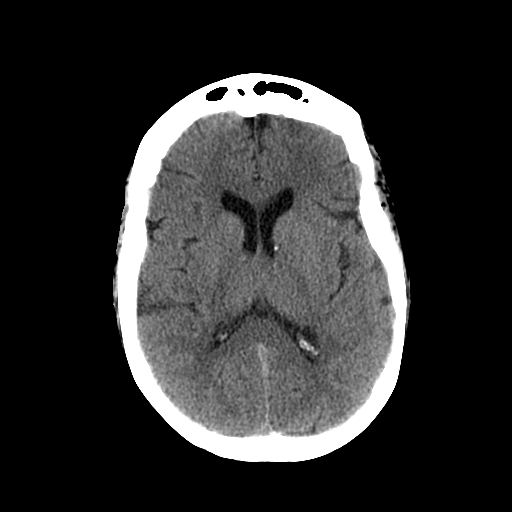
[im 15/28  bone]
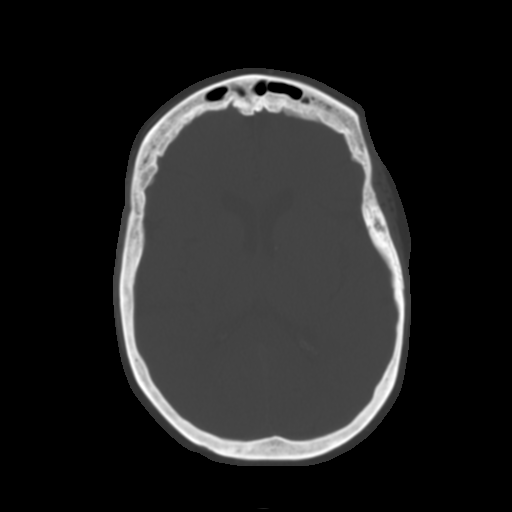
[im 17/28  brain]
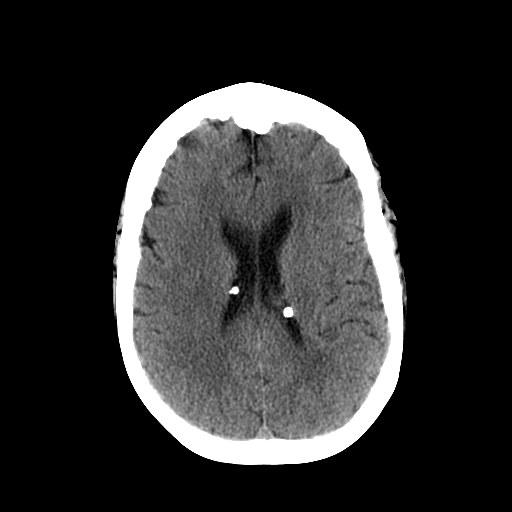
[im 19/28  brain]
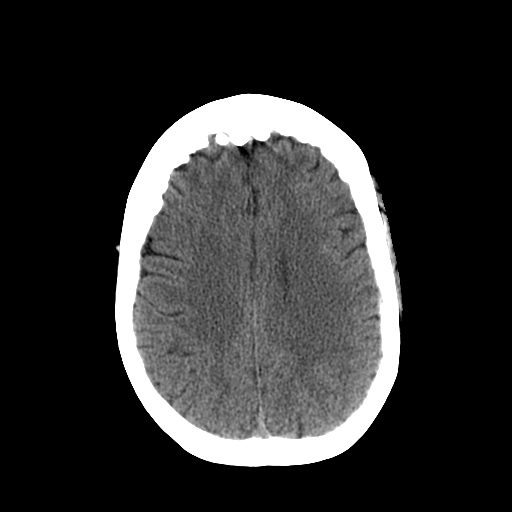
[im 20/28  brain]
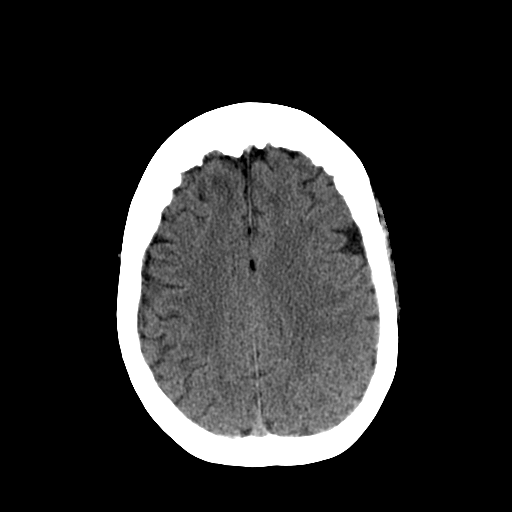
[im 22/28  brain]
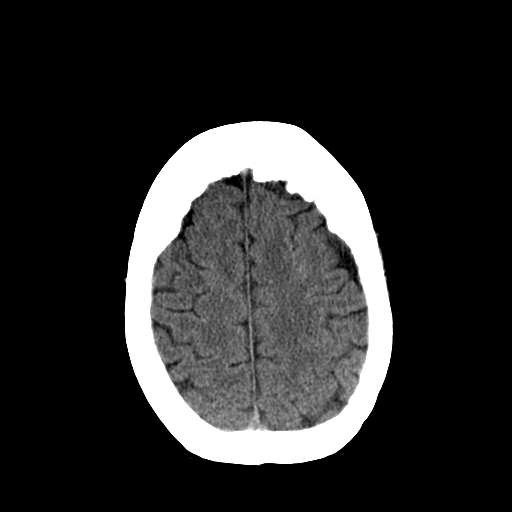
[im 22/28  bone]
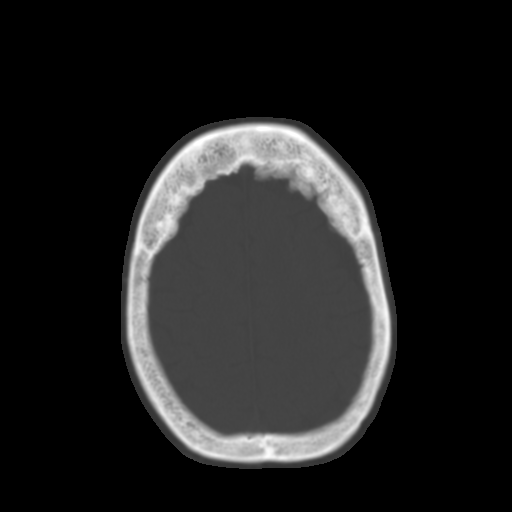
[im 23/28  brain]
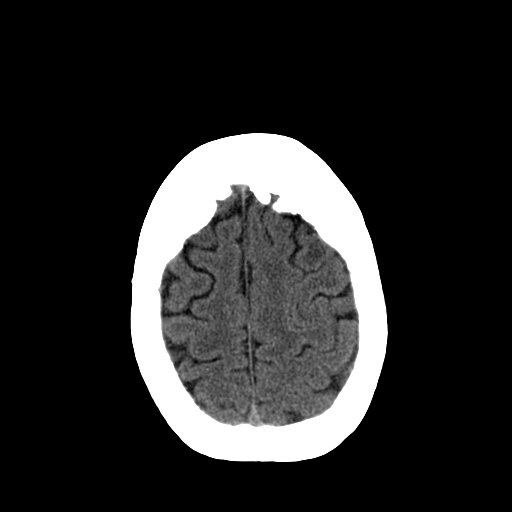
[im 25/28  brain]
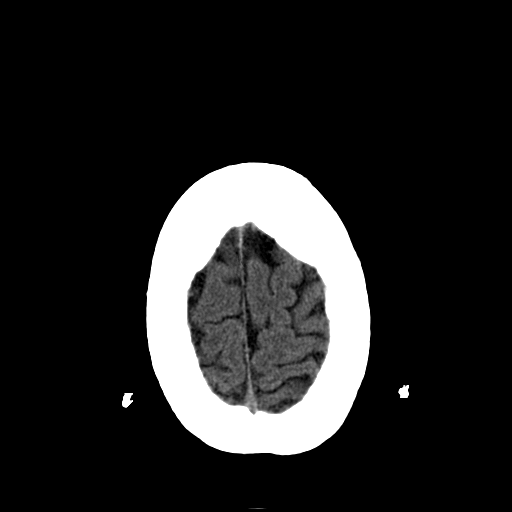
[im 27/28  brain]
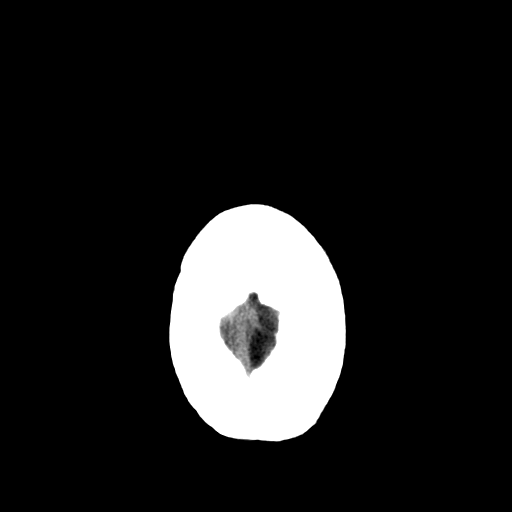

[16 of 28 positions shown; findings below may reference images not displayed]

FINDINGS: Again noted are subependymal calcifications within the
lateral ventricles, stable since prior study, likely reflective of
tuberous sclerosis or prior infection. No acute intracranial
abnormality.  Specifically, no hemorrhage, hydrocephalus, mass
lesion, acute infarction, or significant intracranial injury.  No
acute calvarial abnormality.

Visualized paranasal sinuses and mastoids clear.  Orbital soft
tissues unremarkable.
IMPRESSION: No acute intracranial abnormality.

## 2011-11-09 LAB — RENAL FUNCTION PANEL
Albumin: 2.3 g/dL — ABNORMAL LOW (ref 3.4–5.0)
Chloride: 96 mmol/L — ABNORMAL LOW (ref 98–107)
Creatinine: 4.75 mg/dL — ABNORMAL HIGH (ref 0.60–1.30)
EGFR (Non-African Amer.): 10 — ABNORMAL LOW
Glucose: 98 mg/dL (ref 65–99)
Osmolality: 277 (ref 275–301)
Phosphorus: 2.3 mg/dL — ABNORMAL LOW (ref 2.5–4.9)
Potassium: 3.9 mmol/L (ref 3.5–5.1)
Sodium: 134 mmol/L — ABNORMAL LOW (ref 136–145)

## 2011-11-11 LAB — BASIC METABOLIC PANEL
Anion Gap: 7 (ref 7–16)
Chloride: 95 mmol/L — ABNORMAL LOW (ref 98–107)
Co2: 31 mmol/L (ref 21–32)
EGFR (African American): 11 — ABNORMAL LOW
EGFR (Non-African Amer.): 9 — ABNORMAL LOW
Potassium: 4.6 mmol/L (ref 3.5–5.1)

## 2011-11-11 LAB — CULTURE, BLOOD (SINGLE)

## 2011-11-11 LAB — HEMOGLOBIN: HGB: 9.3 g/dL — ABNORMAL LOW (ref 12.0–16.0)

## 2011-11-12 LAB — RENAL FUNCTION PANEL
Albumin: 2.4 g/dL — ABNORMAL LOW (ref 3.4–5.0)
Chloride: 93 mmol/L — ABNORMAL LOW (ref 98–107)
EGFR (Non-African Amer.): 8 — ABNORMAL LOW
Glucose: 65 mg/dL (ref 65–99)
Osmolality: 272 (ref 275–301)
Phosphorus: 3.8 mg/dL (ref 2.5–4.9)

## 2011-11-12 LAB — CBC WITH DIFFERENTIAL/PLATELET
Basophil #: 0 10*3/uL (ref 0.0–0.1)
Eosinophil #: 0.1 10*3/uL (ref 0.0–0.7)
Eosinophil %: 2.6 %
HCT: 28.8 % — ABNORMAL LOW (ref 35.0–47.0)
HGB: 9.2 g/dL — ABNORMAL LOW (ref 12.0–16.0)
Lymphocyte #: 0.6 10*3/uL — ABNORMAL LOW (ref 1.0–3.6)
Lymphocyte %: 11.7 %
MCH: 32.8 pg (ref 26.0–34.0)
MCV: 103 fL — ABNORMAL HIGH (ref 80–100)
Monocyte #: 0.2 x10 3/mm (ref 0.2–0.9)
Monocyte %: 3.5 %
Neutrophil #: 4 10*3/uL (ref 1.4–6.5)
Neutrophil %: 81.4 %
RDW: 16.5 % — ABNORMAL HIGH (ref 11.5–14.5)

## 2011-11-13 LAB — CLOSTRIDIUM DIFFICILE BY PCR

## 2011-11-14 ENCOUNTER — Ambulatory Visit: Payer: Self-pay | Admitting: Internal Medicine

## 2011-11-14 LAB — CBC WITH DIFFERENTIAL/PLATELET
Basophil #: 0 10*3/uL (ref 0.0–0.1)
Eosinophil #: 0.1 10*3/uL (ref 0.0–0.7)
Eosinophil %: 1.7 %
Lymphocyte %: 11.2 %
MCH: 33 pg (ref 26.0–34.0)
MCHC: 32.1 g/dL (ref 32.0–36.0)
MCV: 103 fL — ABNORMAL HIGH (ref 80–100)
Monocyte #: 0.1 x10 3/mm — ABNORMAL LOW (ref 0.2–0.9)
Monocyte %: 3.1 %
Neutrophil #: 3.3 10*3/uL (ref 1.4–6.5)

## 2011-11-14 LAB — RENAL FUNCTION PANEL
Albumin: 2.4 g/dL — ABNORMAL LOW (ref 3.4–5.0)
Anion Gap: 5 — ABNORMAL LOW (ref 7–16)
BUN: 24 mg/dL — ABNORMAL HIGH (ref 7–18)
Calcium, Total: 8.1 mg/dL — ABNORMAL LOW (ref 8.5–10.1)
Co2: 34 mmol/L — ABNORMAL HIGH (ref 21–32)
Creatinine: 3.54 mg/dL — ABNORMAL HIGH (ref 0.60–1.30)
EGFR (African American): 16 — ABNORMAL LOW
EGFR (Non-African Amer.): 14 — ABNORMAL LOW
Glucose: 99 mg/dL (ref 65–99)
Phosphorus: 2.8 mg/dL (ref 2.5–4.9)
Potassium: 4 mmol/L (ref 3.5–5.1)
Sodium: 134 mmol/L — ABNORMAL LOW (ref 136–145)

## 2011-11-15 LAB — STOOL CULTURE

## 2011-11-16 LAB — RENAL FUNCTION PANEL
Albumin: 2.4 g/dL — ABNORMAL LOW (ref 3.4–5.0)
Anion Gap: 4 — ABNORMAL LOW (ref 7–16)
BUN: 32 mg/dL — ABNORMAL HIGH (ref 7–18)
Calcium, Total: 8.7 mg/dL (ref 8.5–10.1)
Chloride: 94 mmol/L — ABNORMAL LOW (ref 98–107)
Co2: 35 mmol/L — ABNORMAL HIGH (ref 21–32)
Creatinine: 5.41 mg/dL — ABNORMAL HIGH (ref 0.60–1.30)
EGFR (African American): 10 — ABNORMAL LOW
EGFR (Non-African Amer.): 8 — ABNORMAL LOW
Glucose: 84 mg/dL (ref 65–99)
Osmolality: 272 (ref 275–301)
Phosphorus: 3.9 mg/dL (ref 2.5–4.9)
Potassium: 4.8 mmol/L (ref 3.5–5.1)
Sodium: 133 mmol/L — ABNORMAL LOW (ref 136–145)

## 2011-11-16 LAB — CBC WITH DIFFERENTIAL/PLATELET
Basophil #: 0 10*3/uL (ref 0.0–0.1)
Basophil %: 0.9 %
Eosinophil #: 0.1 10*3/uL (ref 0.0–0.7)
Eosinophil %: 1.9 %
HCT: 29.2 % — ABNORMAL LOW (ref 35.0–47.0)
HGB: 9.2 g/dL — ABNORMAL LOW (ref 12.0–16.0)
Lymphocyte #: 0.6 10*3/uL — ABNORMAL LOW (ref 1.0–3.6)
Lymphocyte %: 12.7 %
MCH: 32.7 pg (ref 26.0–34.0)
MCHC: 31.7 g/dL — ABNORMAL LOW (ref 32.0–36.0)
MCV: 103 fL — ABNORMAL HIGH (ref 80–100)
Monocyte #: 0.3 x10 3/mm (ref 0.2–0.9)
Monocyte %: 5.9 %
Neutrophil #: 3.4 10*3/uL (ref 1.4–6.5)
Neutrophil %: 78.6 %
Platelet: 149 10*3/uL — ABNORMAL LOW (ref 150–440)
RBC: 2.82 10*6/uL — ABNORMAL LOW (ref 3.80–5.20)
RDW: 16.5 % — ABNORMAL HIGH (ref 11.5–14.5)
WBC: 4.3 10*3/uL (ref 3.6–11.0)

## 2011-11-17 LAB — RENAL FUNCTION PANEL
Albumin: 2.5 g/dL — ABNORMAL LOW (ref 3.4–5.0)
Anion Gap: 5 — ABNORMAL LOW (ref 7–16)
BUN: 20 mg/dL — ABNORMAL HIGH (ref 7–18)
Calcium, Total: 8.9 mg/dL (ref 8.5–10.1)
EGFR (African American): 13 — ABNORMAL LOW
Glucose: 80 mg/dL (ref 65–99)
Potassium: 4.5 mmol/L (ref 3.5–5.1)
Sodium: 135 mmol/L — ABNORMAL LOW (ref 136–145)

## 2011-11-17 LAB — CBC WITH DIFFERENTIAL/PLATELET
Basophil #: 0 10*3/uL (ref 0.0–0.1)
Basophil %: 0.8 %
Eosinophil #: 0.1 10*3/uL (ref 0.0–0.7)
Eosinophil %: 1.3 %
HCT: 29.4 % — ABNORMAL LOW (ref 35.0–47.0)
Lymphocyte %: 12.6 %
MCV: 104 fL — ABNORMAL HIGH (ref 80–100)
Monocyte #: 0.3 x10 3/mm (ref 0.2–0.9)
Monocyte %: 5.9 %
Neutrophil #: 3.5 10*3/uL (ref 1.4–6.5)
Neutrophil %: 79.4 %
Platelet: 163 10*3/uL (ref 150–440)
RDW: 16.4 % — ABNORMAL HIGH (ref 11.5–14.5)
WBC: 4.4 10*3/uL (ref 3.6–11.0)

## 2011-12-05 IMAGING — CR DG CHEST 1V
1 series · 1 of 1 positions shown · non-contrast
Comparison: 09/21/2009

CLINICAL DATA: Fell, chest pain

CHEST - 1 VIEW

[view not recorded]
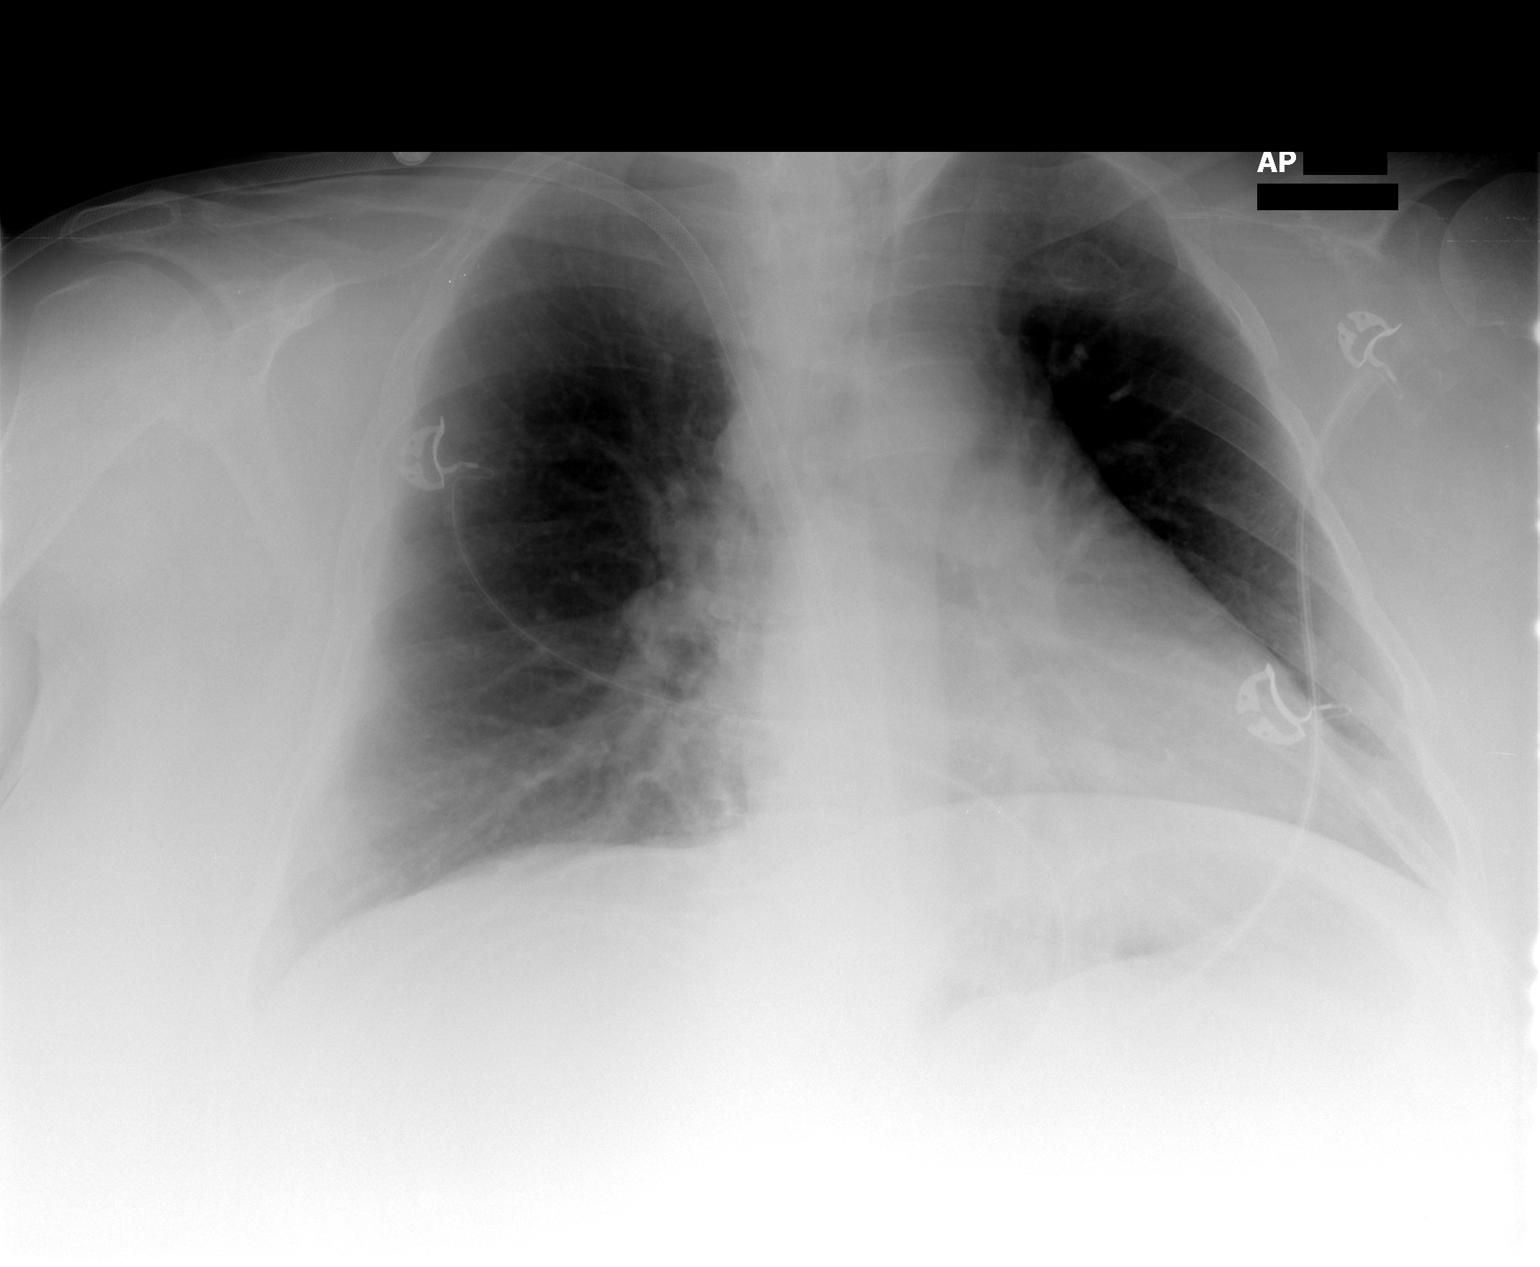

[1 of 1 positions shown; findings below may reference images not displayed]

FINDINGS: Right upper extremity year catheter stable.  Heart size
upper limits normal.  Lungs are clear, with low lung volumes.  No
effusion.
IMPRESSION: No acute disease.

## 2011-12-05 IMAGING — CR DG KNEE 1-2V*R*
2 series · 2 of 2 positions shown · non-contrast
Comparison: None.

CLINICAL DATA: Pain post fall

RIGHT KNEE - 1-2 VIEW

[view not recorded (1 of 2)]
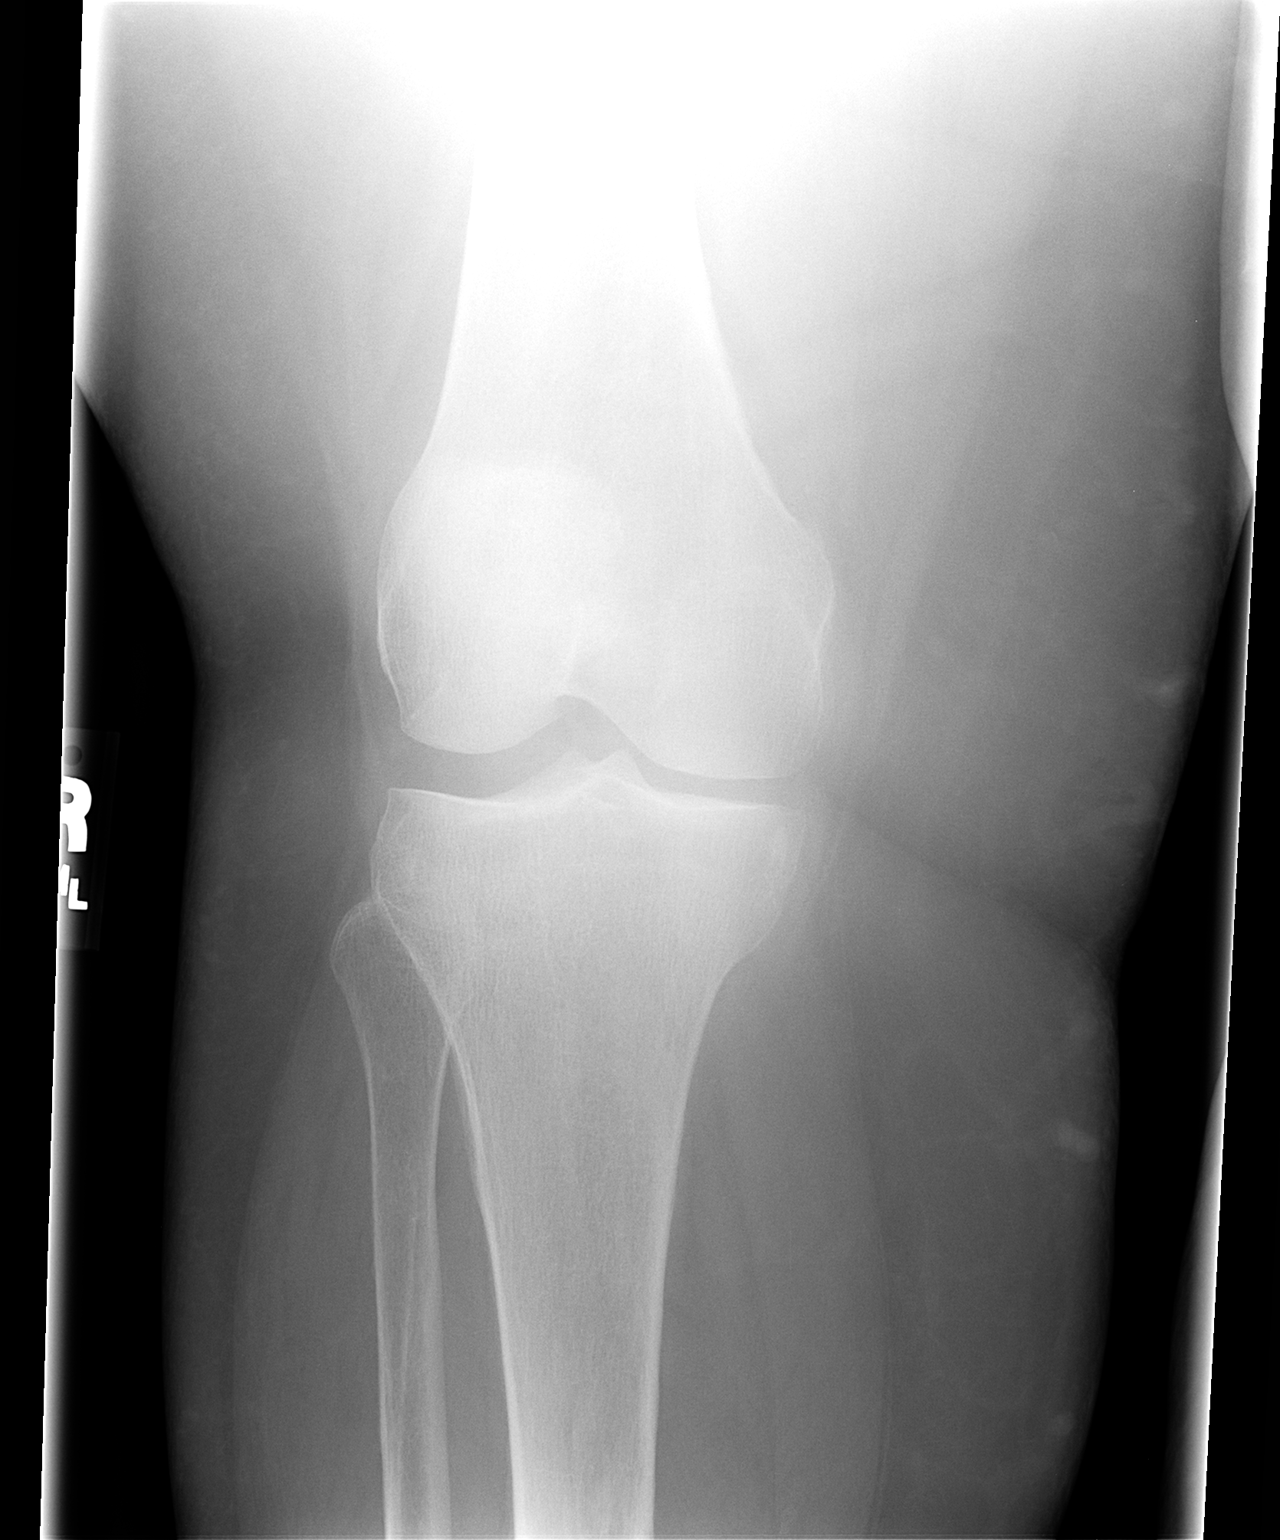

[view not recorded (2 of 2)]
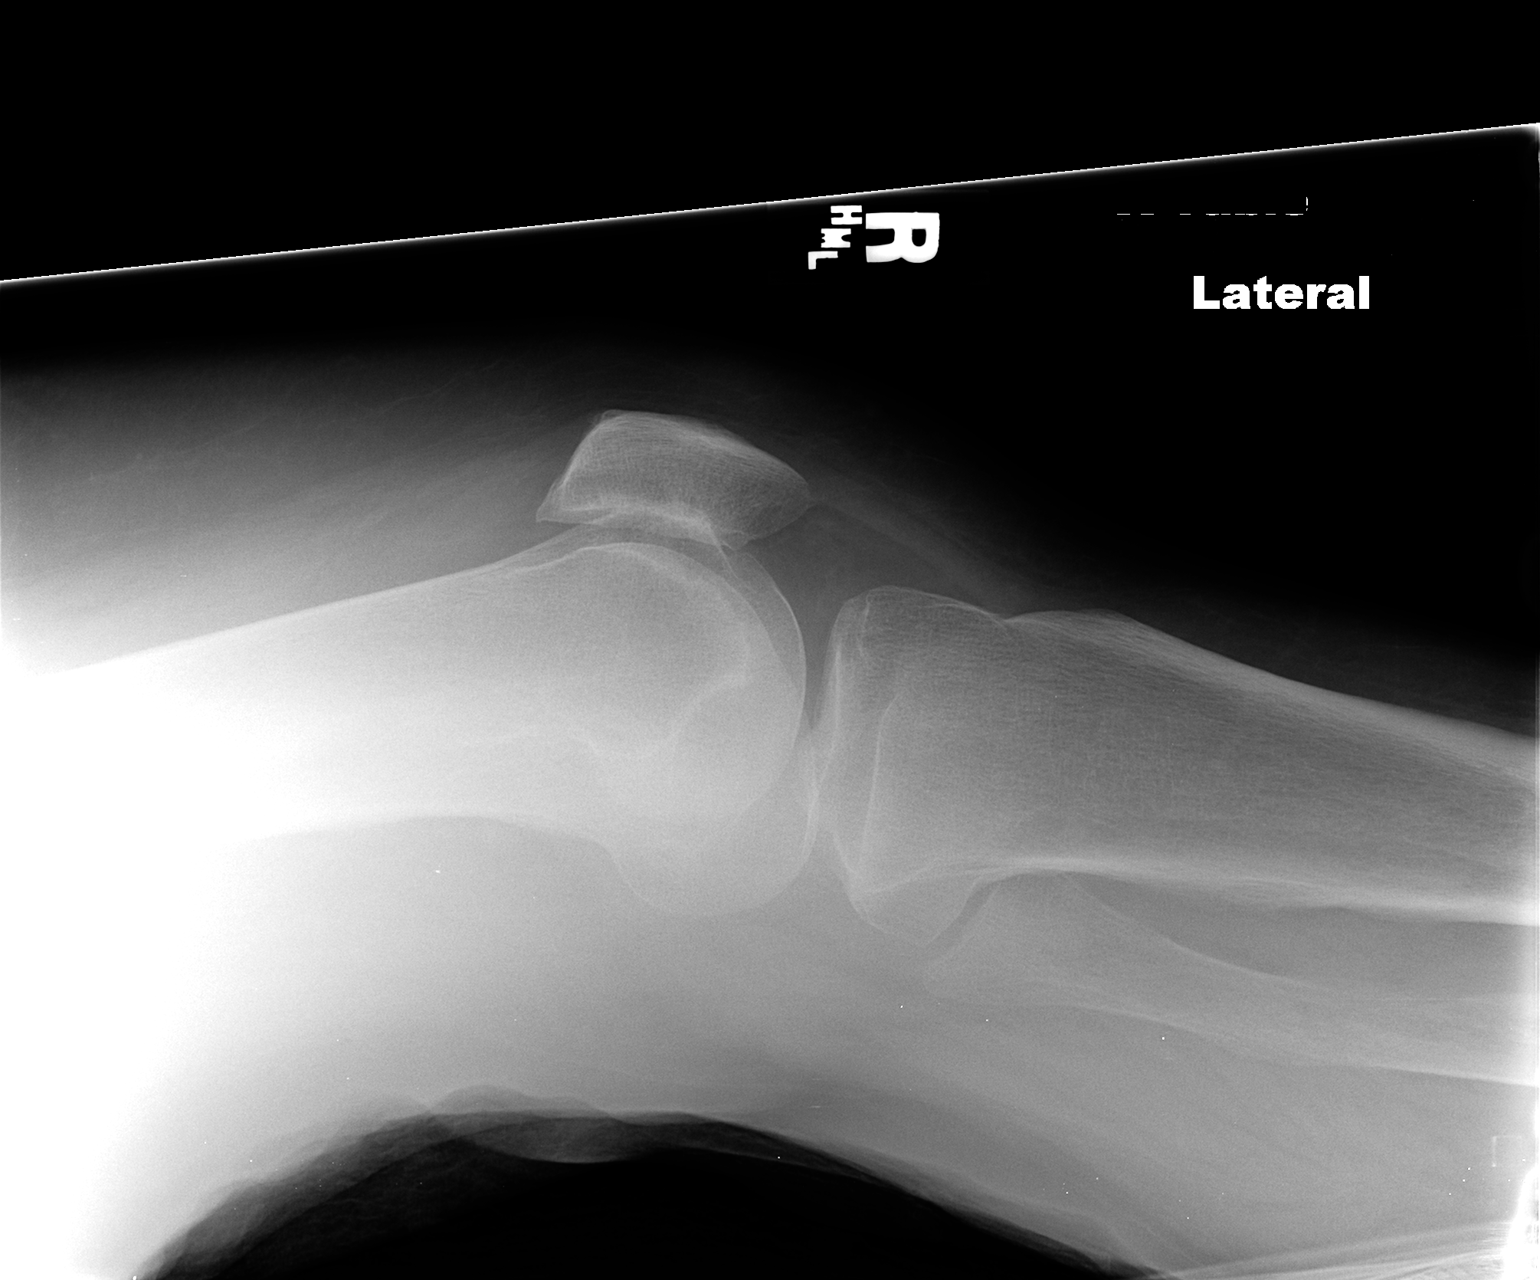

[2 of 2 positions shown; findings below may reference images not displayed]

FINDINGS: No definite effusion. Negative for fracture, dislocation,
or other acute abnormality.  Normal alignment and mineralization.
No significant degenerative change.  Regional soft tissues
unremarkable.
IMPRESSION: Negative

## 2011-12-05 IMAGING — CR DG KNEE 1-2V*L*
2 series · 2 of 2 positions shown · non-contrast
Comparison: None.

CLINICAL DATA: Pain post fall

LEFT KNEE - 1-2 VIEW

[view not recorded (1 of 2)]
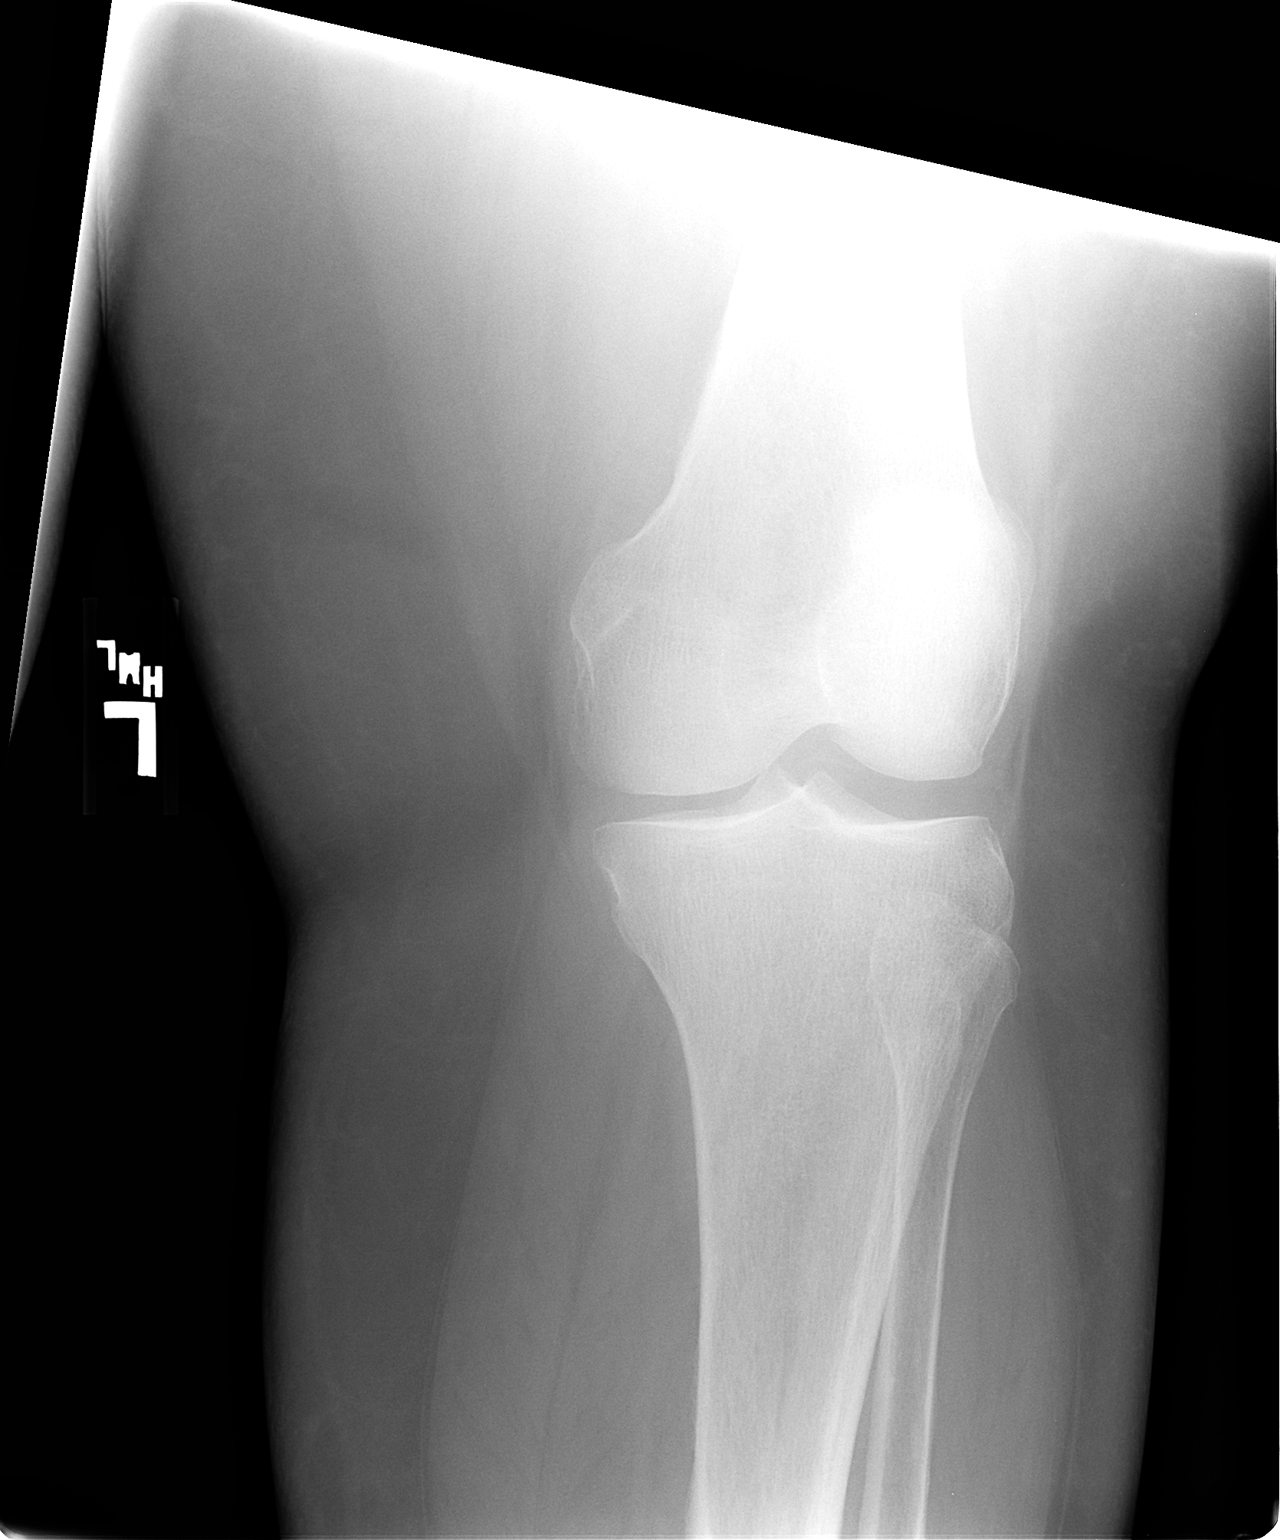

[view not recorded (2 of 2)]
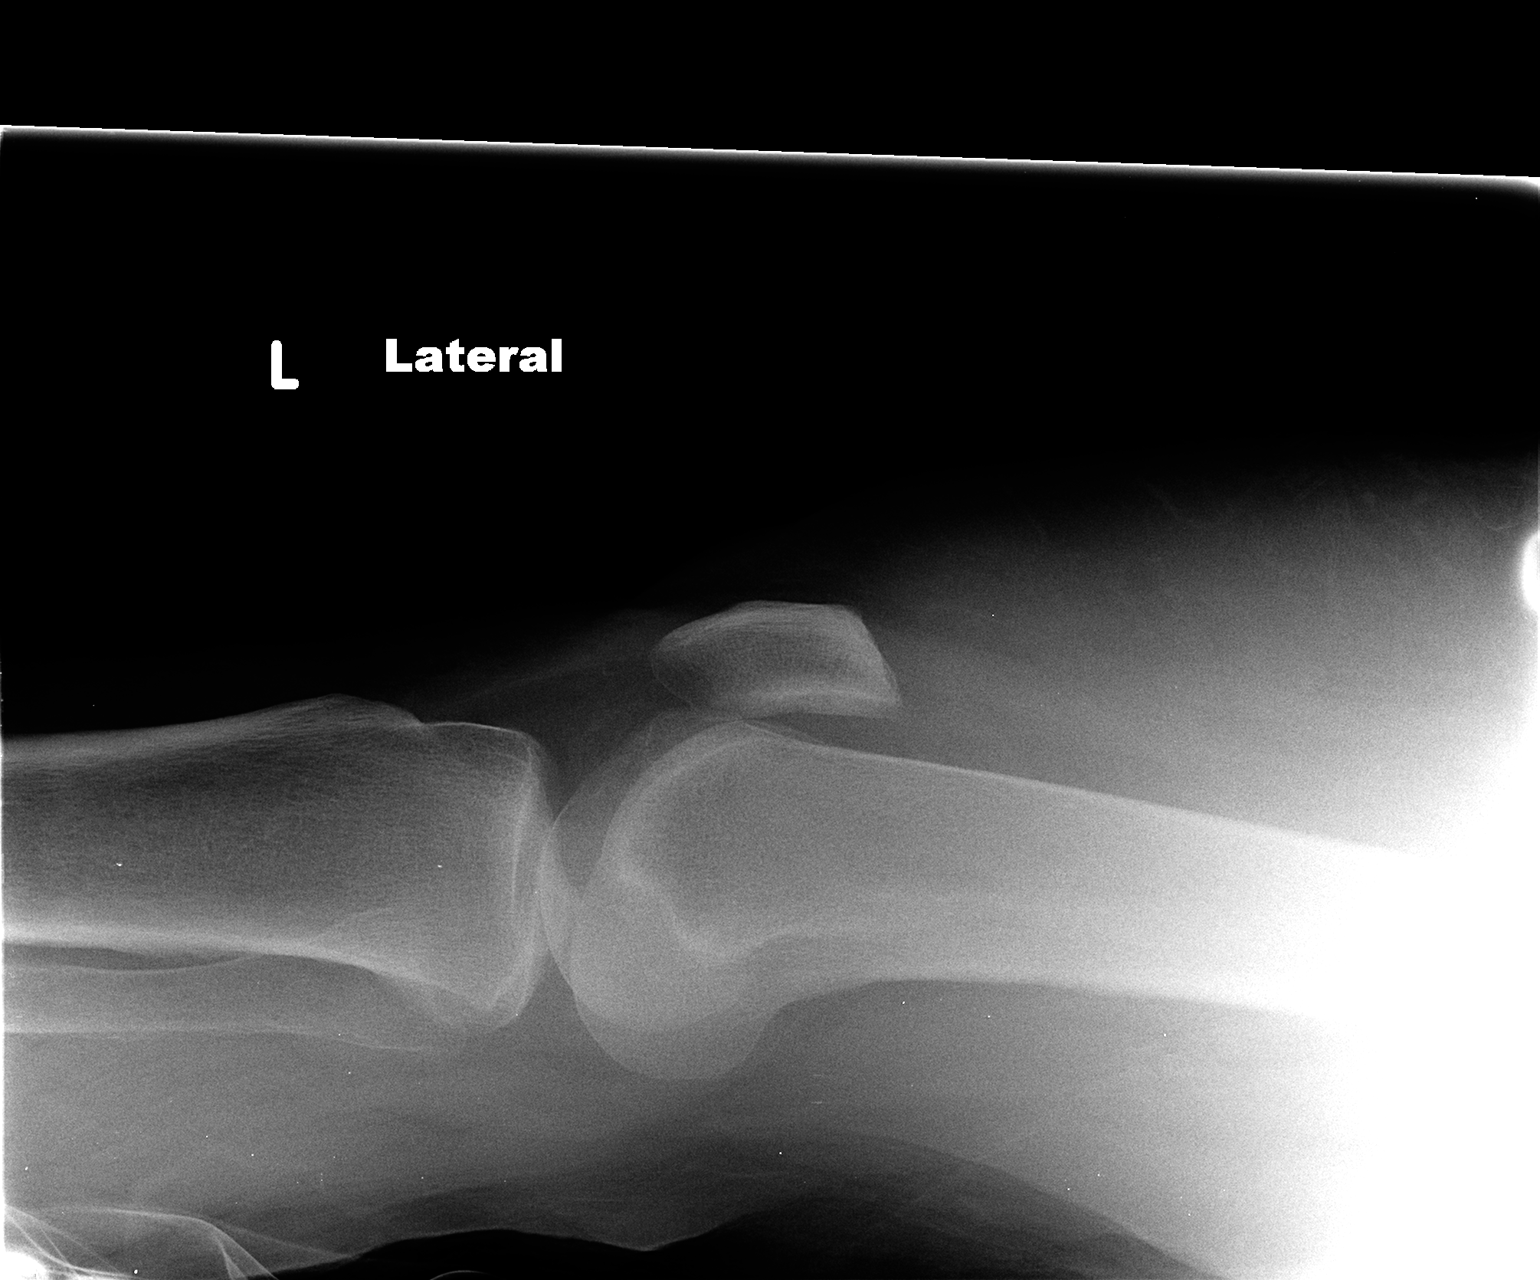

[2 of 2 positions shown; findings below may reference images not displayed]

FINDINGS: No effusion. Negative for fracture, dislocation, or other
acute abnormality.  Normal alignment and mineralization. No
significant degenerative change.  Regional soft tissues
unremarkable.
IMPRESSION: Negative

## 2011-12-06 IMAGING — CR DG ABD PORTABLE 1V
2 series · 2 of 2 positions shown · non-contrast
Comparison: 07/06/2008

CLINICAL DATA: Abdominal pain.

ABDOMEN - 1 VIEW

[view not recorded (1 of 2)]
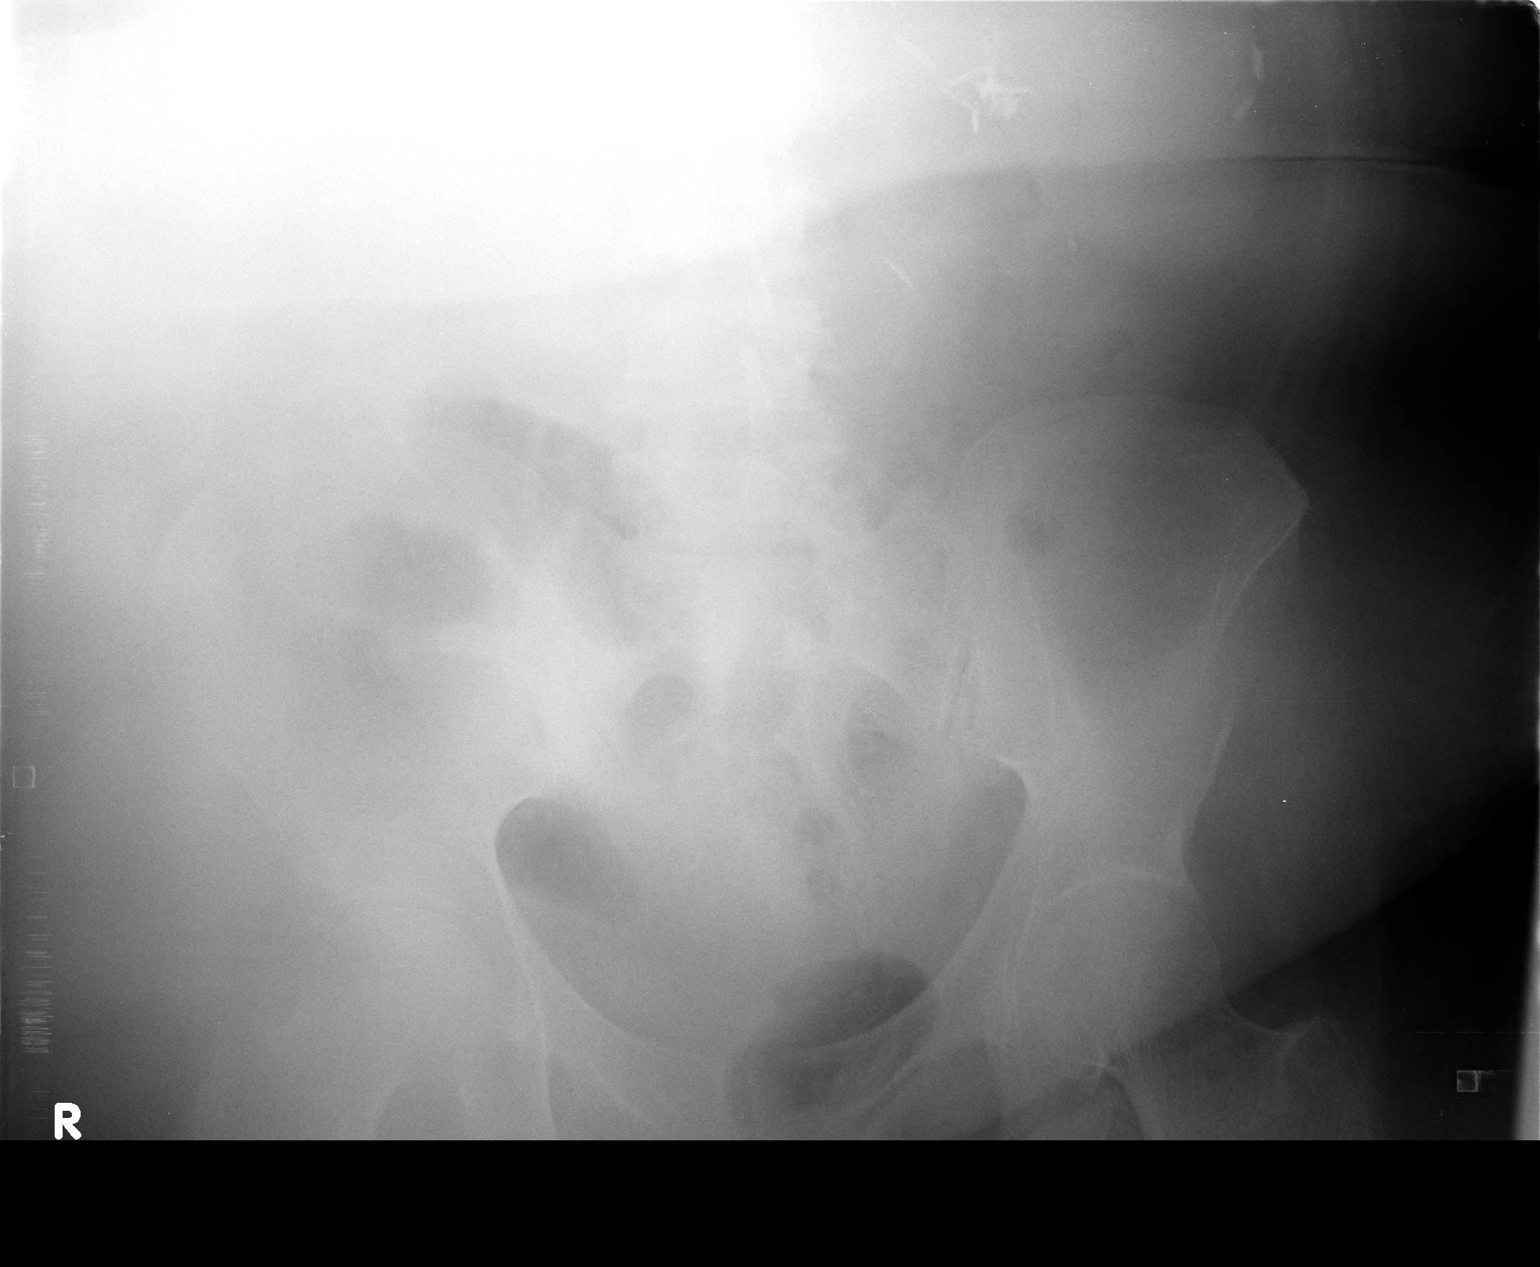

[view not recorded (2 of 2)]
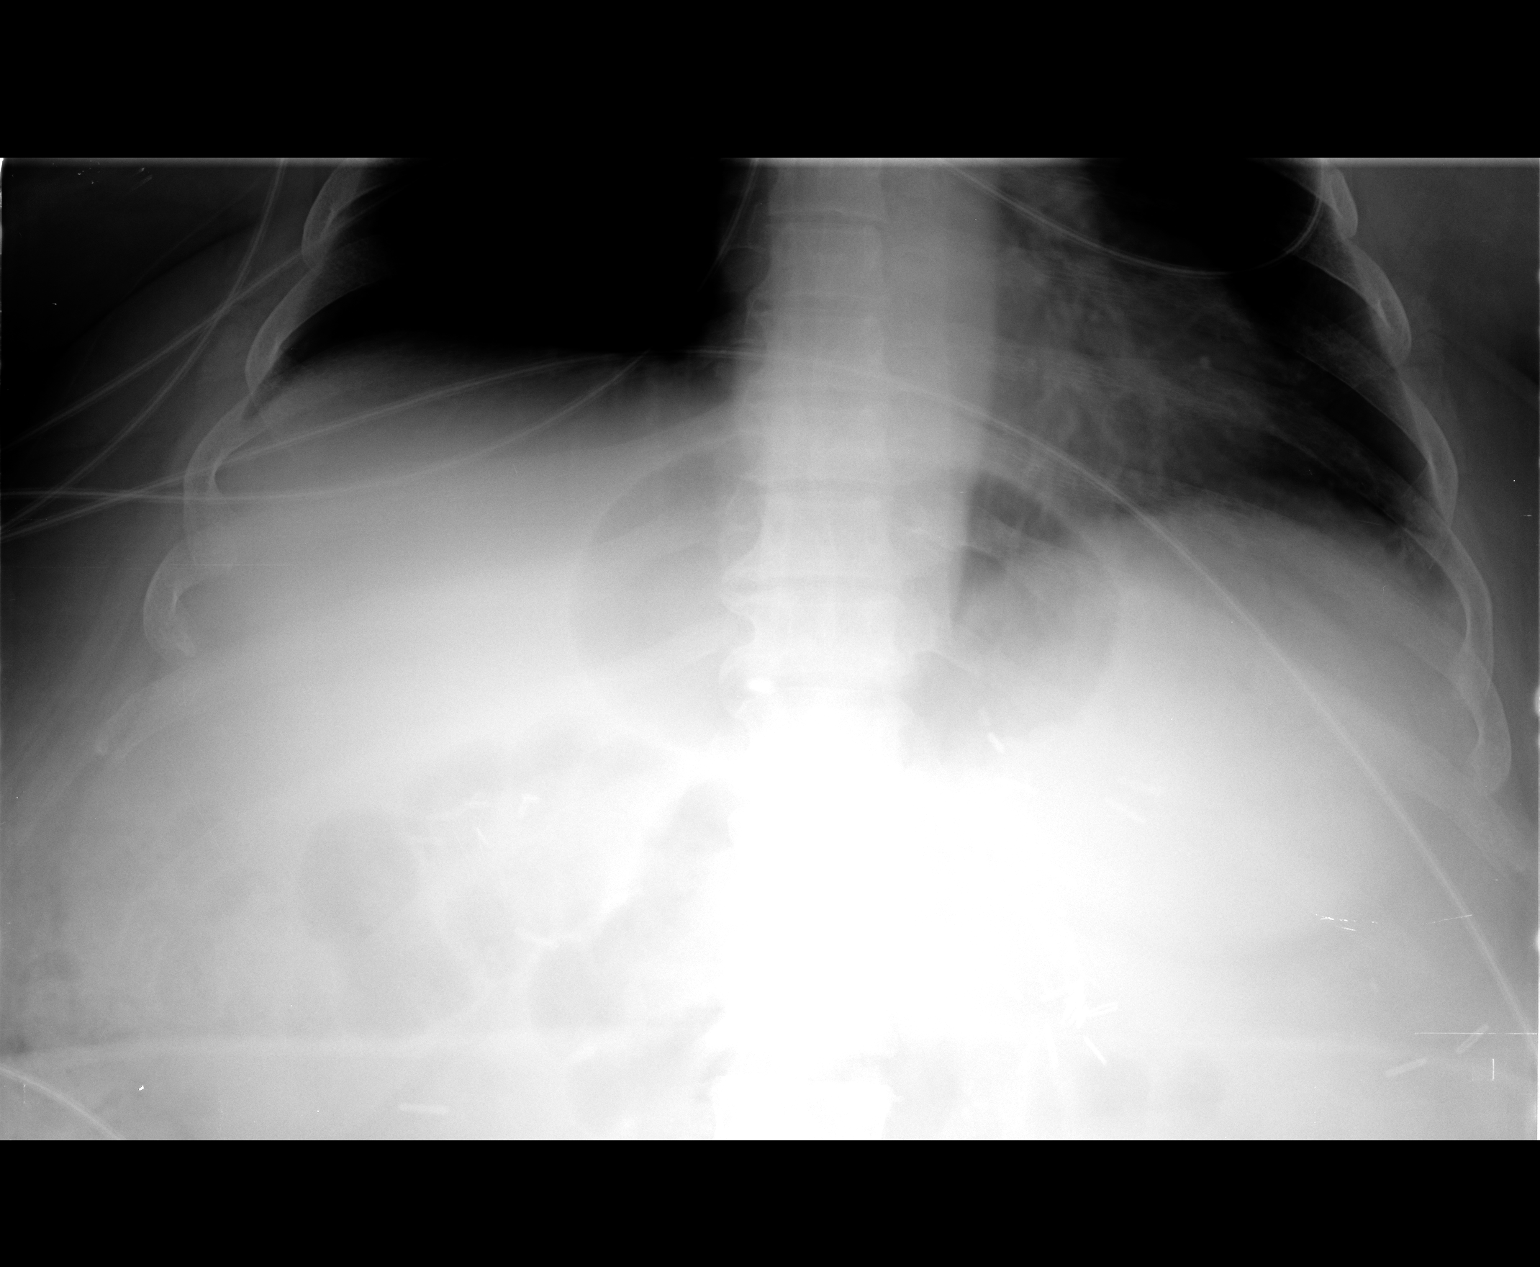

[2 of 2 positions shown; findings below may reference images not displayed]

FINDINGS: Nonspecific bowel gas pattern.  Mild gaseous distention
of stomach and transverse colon.  No definite bowel obstruction.
No free air, organomegaly.  Extensive surgical clips throughout the
abdomen.
IMPRESSION: Nonspecific, nonobstructive bowel gas pattern.

## 2011-12-14 ENCOUNTER — Ambulatory Visit: Payer: Self-pay | Admitting: Vascular Surgery

## 2011-12-19 IMAGING — CR DG ABDOMEN ACUTE W/ 1V CHEST
4 series · 4 of 4 positions shown · non-contrast
Comparison: Abdominal radiographs 12/05/2009 and chest radiographs
12/04/2009.

CLINICAL DATA: Chest abdominal pain with shortness of breath.

ACUTE ABDOMEN SERIES (ABDOMEN 2 VIEW & CHEST 1 VIEW)

[w chest pa *]
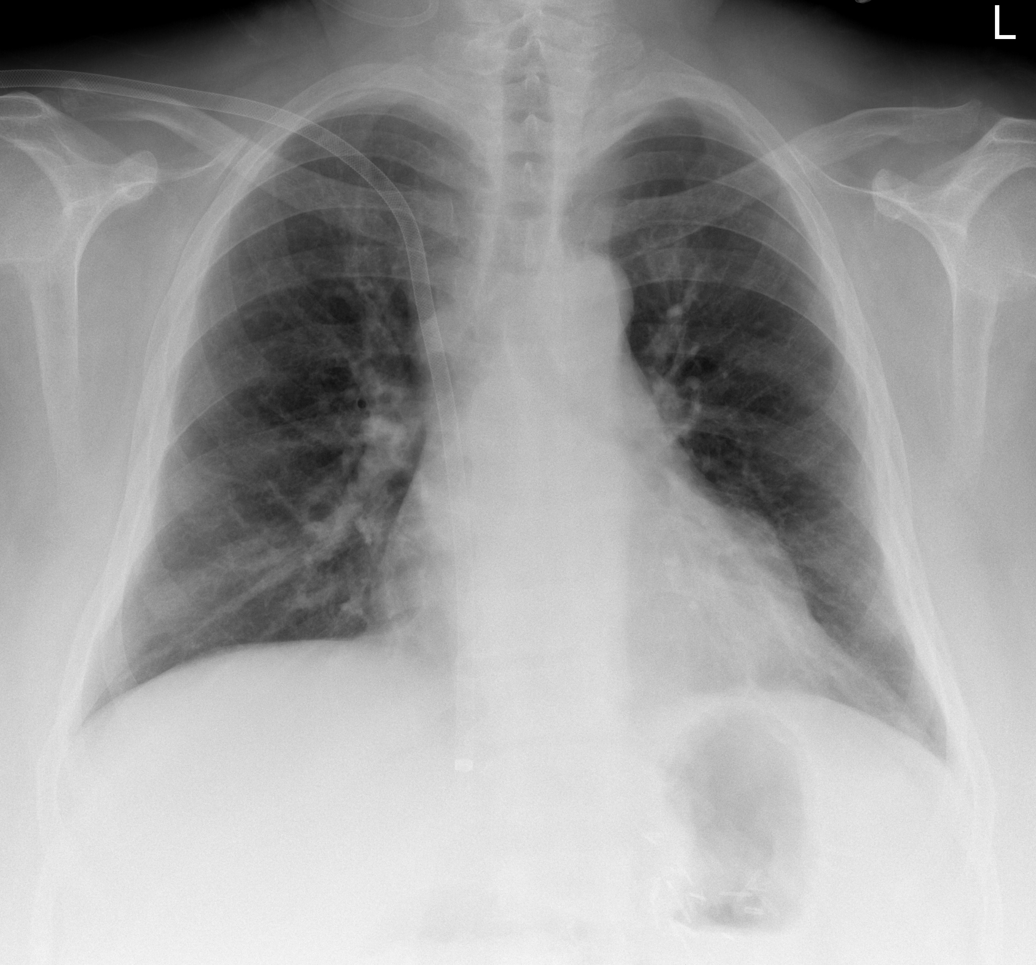

[w abdomen upright *]
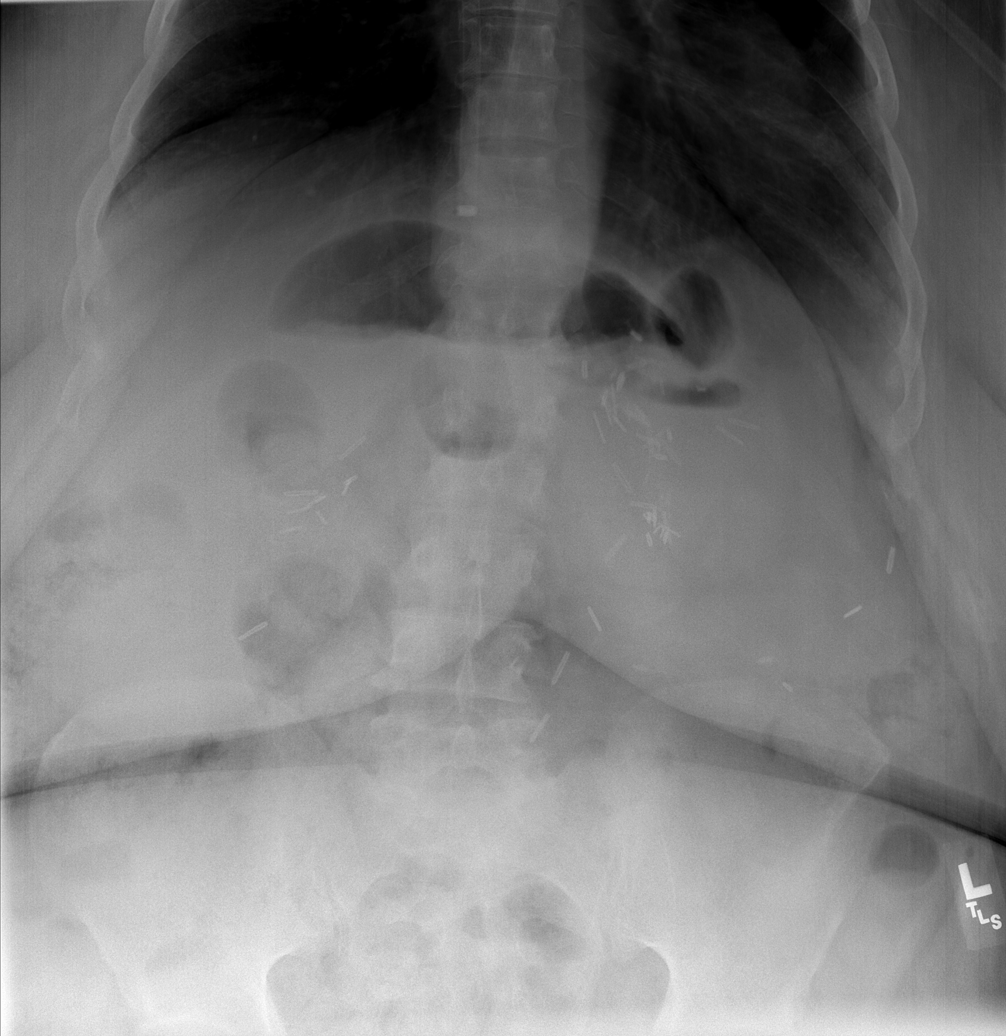

[t abdomen supine * (1 of 2)]
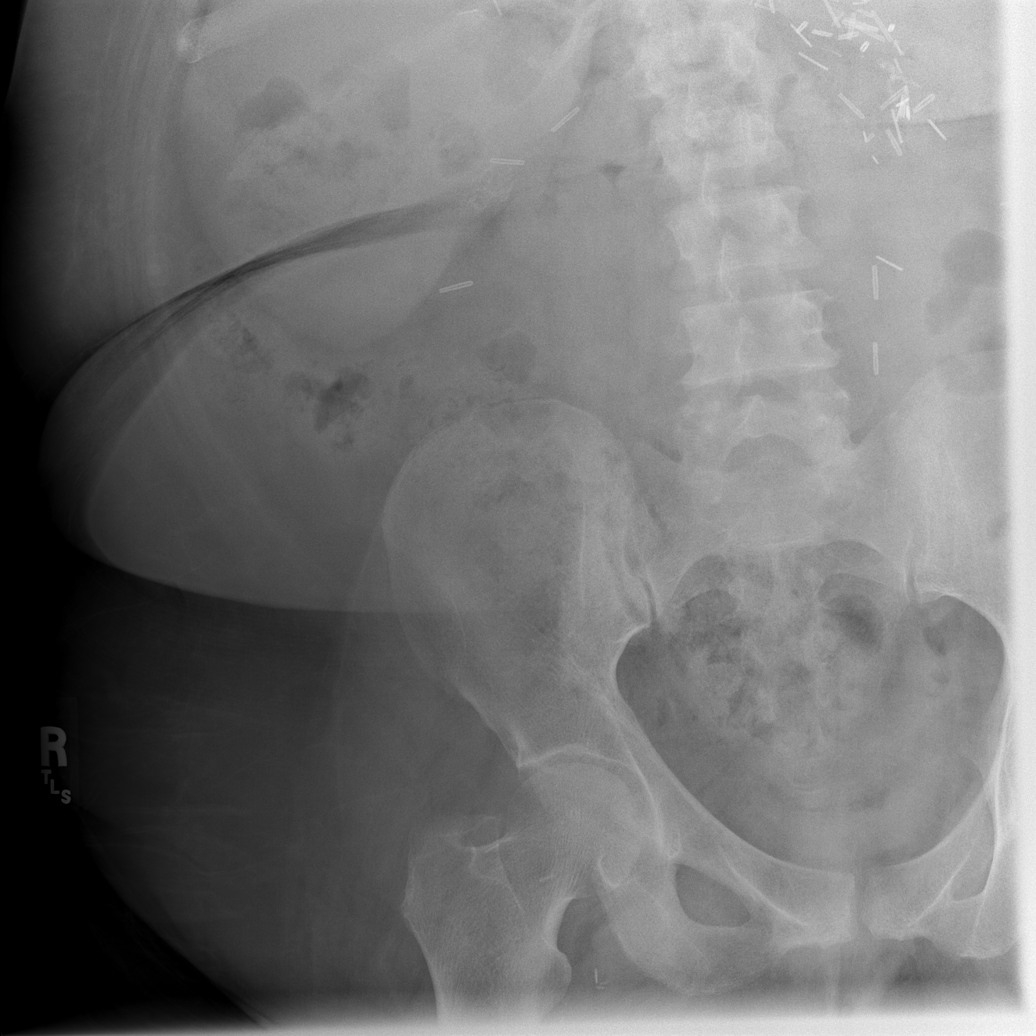

[t abdomen supine * (2 of 2)]
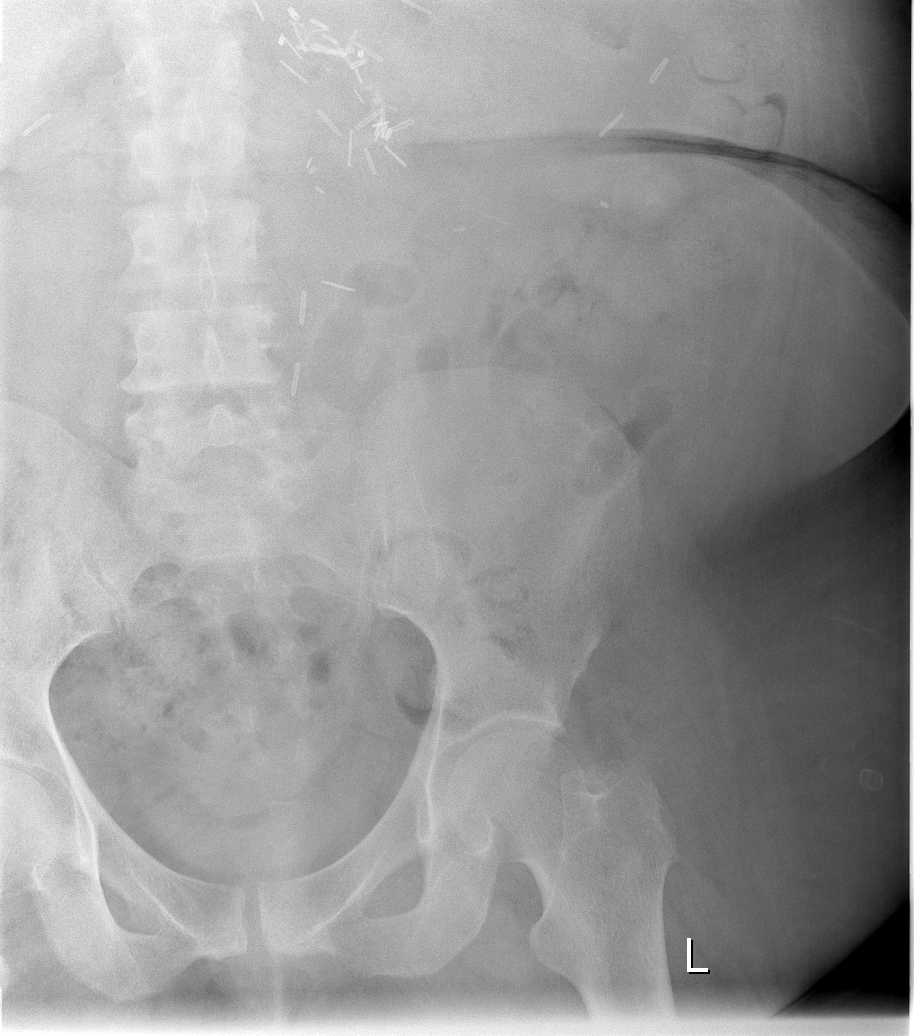

[4 of 4 positions shown; findings below may reference images not displayed]

FINDINGS: There is stable cardiac enlargement.  Right upper
extremity dialysis catheter is unchanged.  The lungs are clear.
There is no pleural effusion or pneumothorax.

The bowel gas pattern is normal.  There is stool throughout the
colon.  There are multiple surgical clips throughout the abdomen.
There is no evidence of free intraperitoneal air.
IMPRESSION: No acute cardiopulmonary or abdominal process identified.

## 2012-01-08 ENCOUNTER — Ambulatory Visit: Payer: Self-pay | Admitting: Vascular Surgery

## 2012-01-14 IMAGING — CT CT HEAD W/O CM
1 series · 16 of 30 positions shown, 20 images · non-contrast
Comparison: 11/07/2009

CLINICAL DATA: Fall and hematoma.

CT HEAD WITHOUT CONTRAST
TECHNIQUE: Contiguous axial images were obtained from the base of
the skull through the vertex without contrast.

[Series 2: head trauma 4.8 h37s · axial · 0.46mm/px · z∈[-108,+25]mm · 16 of 30 slices shown, 20 images]
[im 2/30  brain]
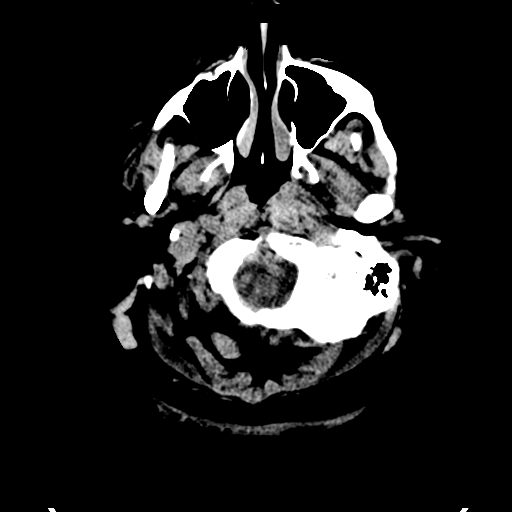
[im 2/30  bone]
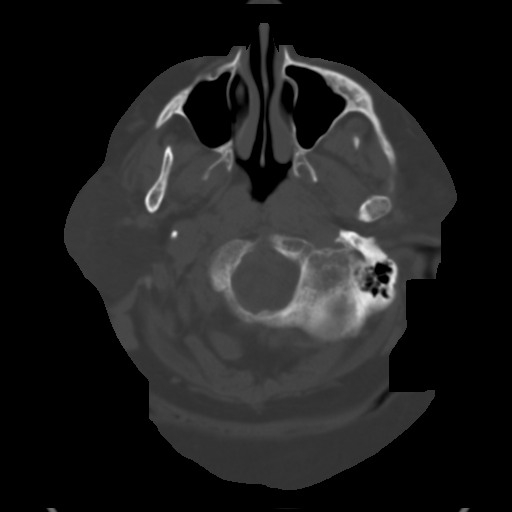
[im 4/30  brain]
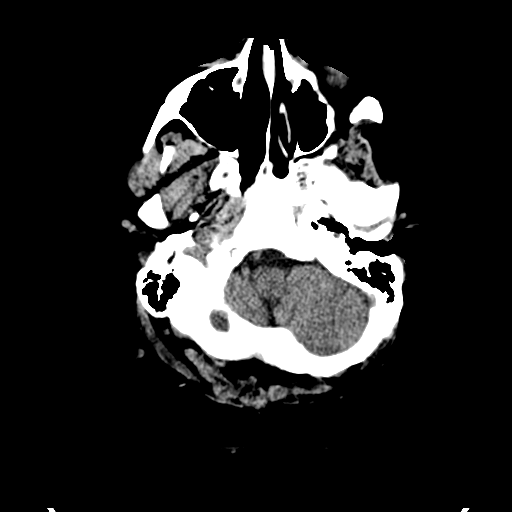
[im 6/30  brain]
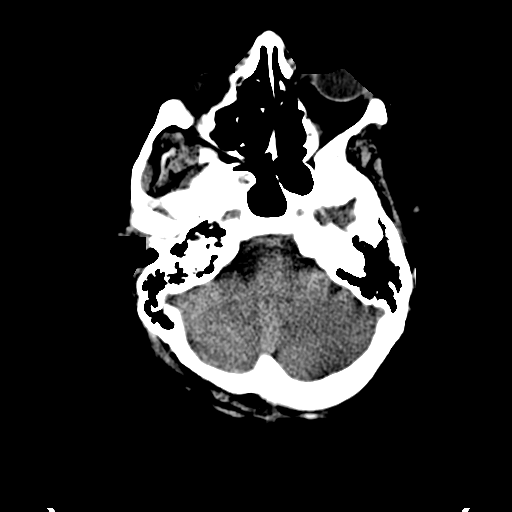
[im 8/30  brain]
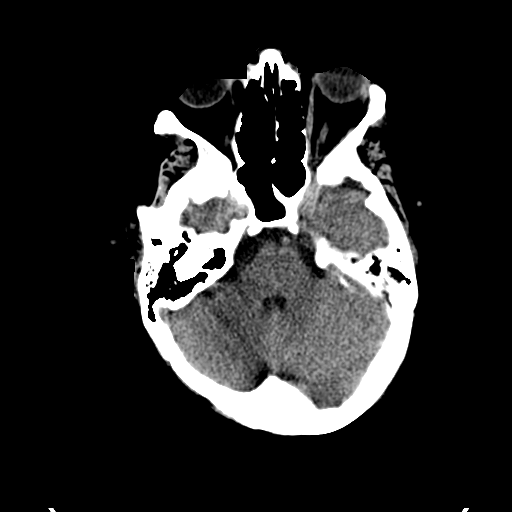
[im 9/30  brain]
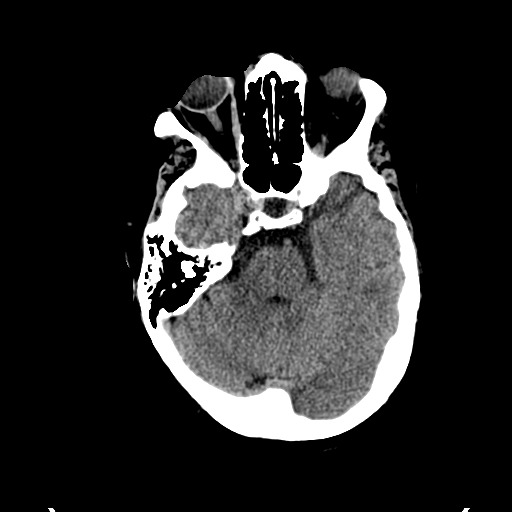
[im 9/30  bone]
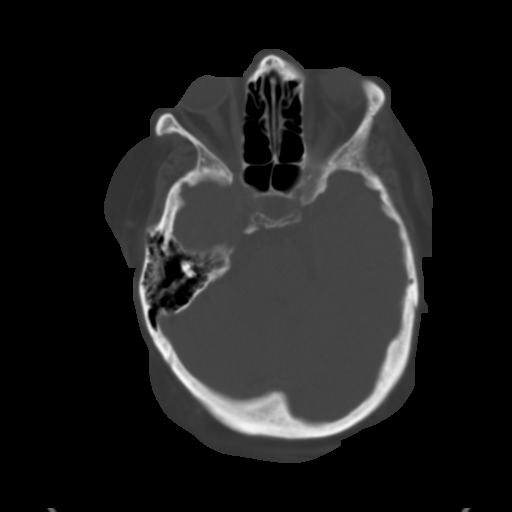
[im 11/30  brain]
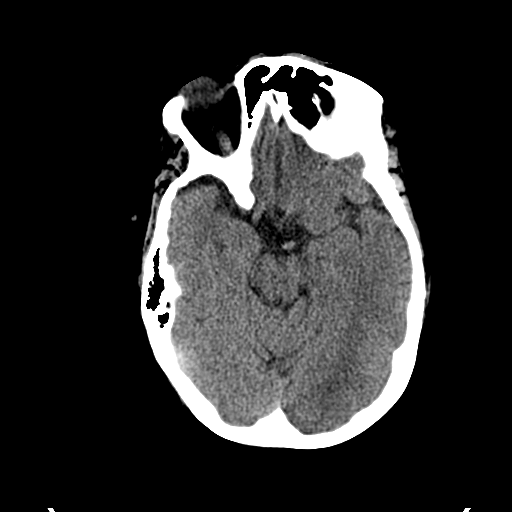
[im 13/30  brain]
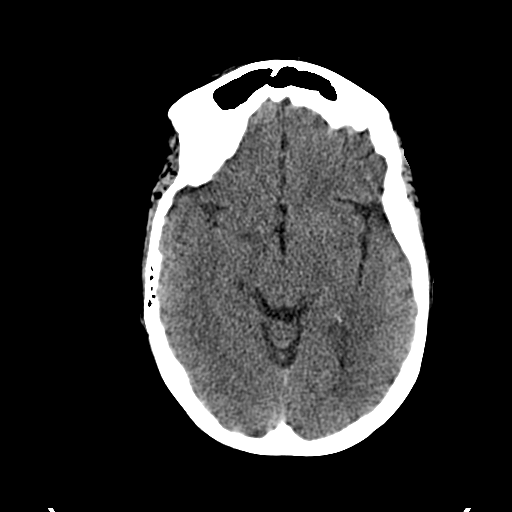
[im 15/30  brain]
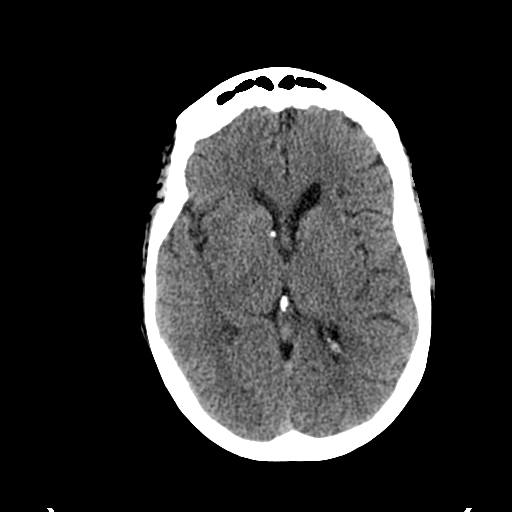
[im 16/30  brain]
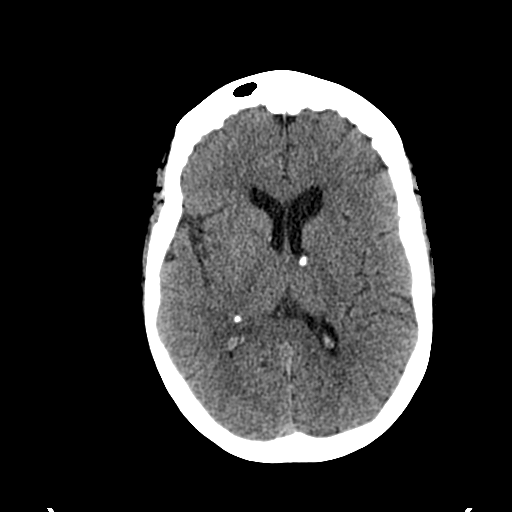
[im 16/30  bone]
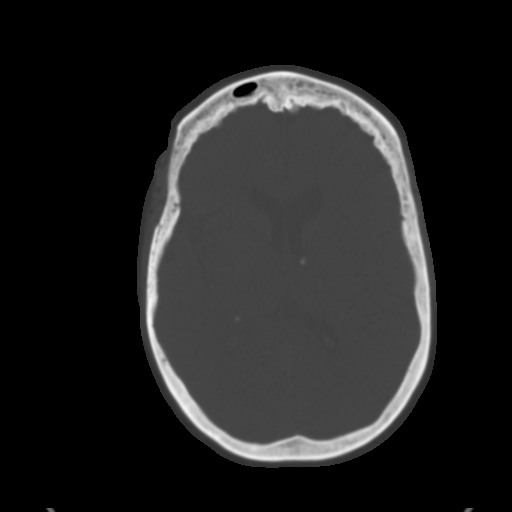
[im 18/30  brain]
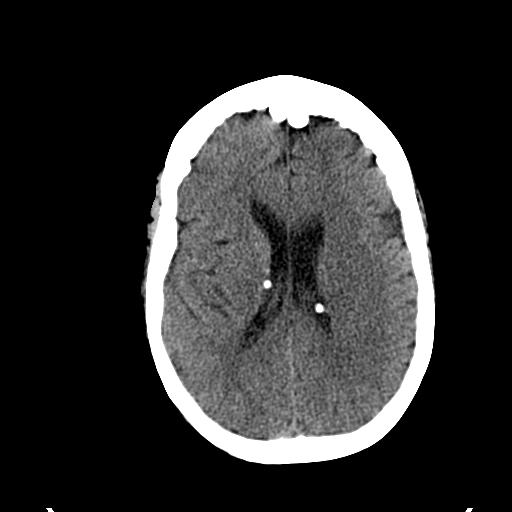
[im 20/30  brain]
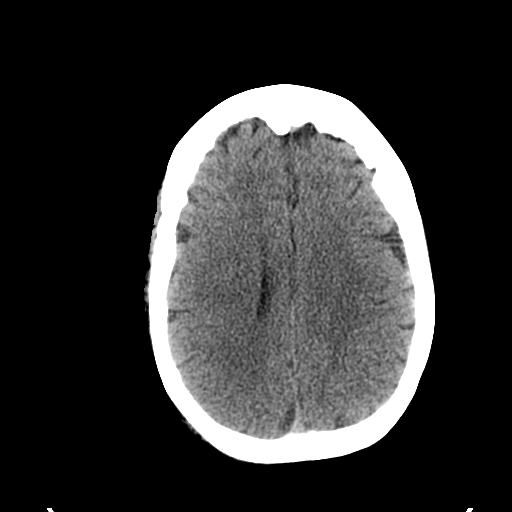
[im 22/30  brain]
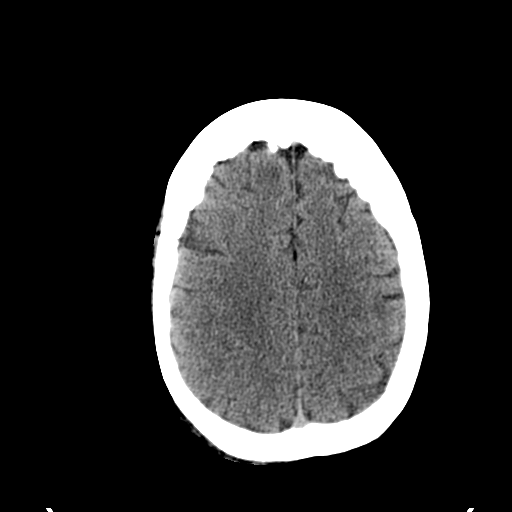
[im 23/30  brain]
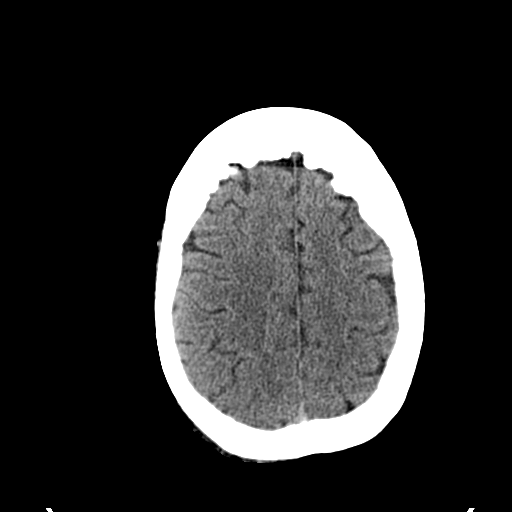
[im 23/30  bone]
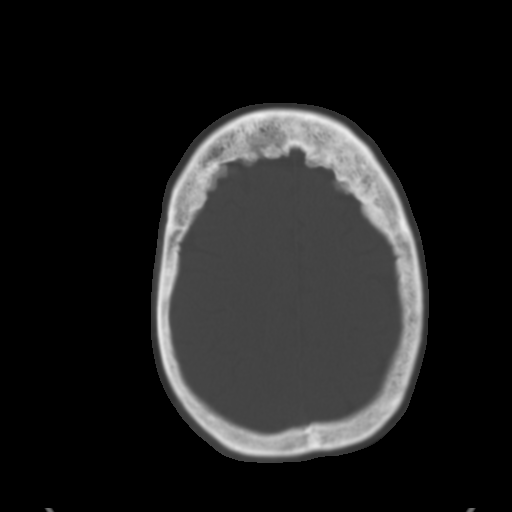
[im 25/30  brain]
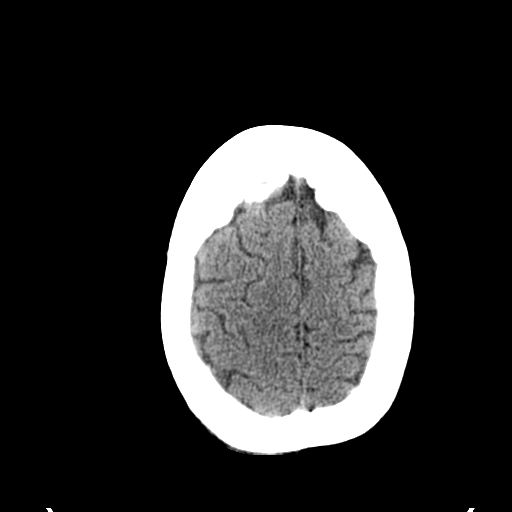
[im 27/30  brain]
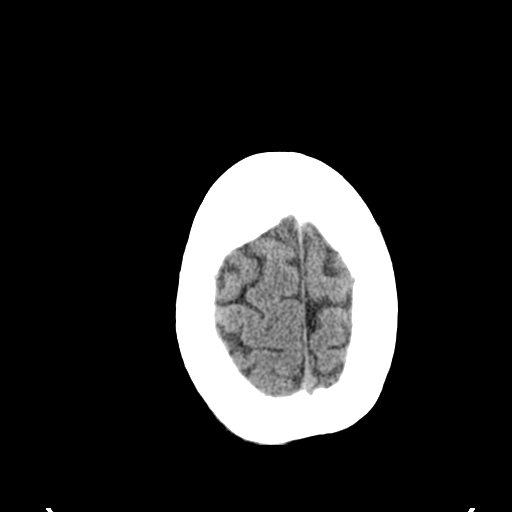
[im 29/30  brain]
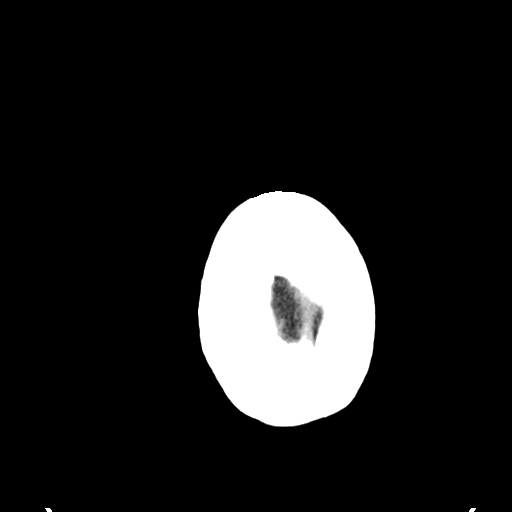

[16 of 30 positions shown; findings below may reference images not displayed]

FINDINGS: Soft tissue swelling and high density in the right
posterior scalp consistent with a hematoma.  No evidence for a
calvarial fracture.  Visualized paranasal sinuses are clear.
Stable appearance of the intracranial structures.  No evidence for
acute hemorrhage, mass lesion, midline shift or hydrocephalus.  No
evidence for a large infarct.  Again noted are scattered
calcifications in the ventricular and periventricular region.
Findings may be related to a remote infection. There is stable
focal soft tissue thickening in the right frontal scalp.
IMPRESSION: Stable head CT findings.  No acute intracranial abnormality.

## 2012-01-14 IMAGING — CR DG CERVICAL SPINE COMPLETE 4+V
6 series · 6 of 6 positions shown · non-contrast
Comparison: Cervical spine CT dated 05/30/2006

CLINICAL DATA: Fall and head hematoma.

CERVICAL SPINE - COMPLETE 4+ VIEW

[w c-spine lat]
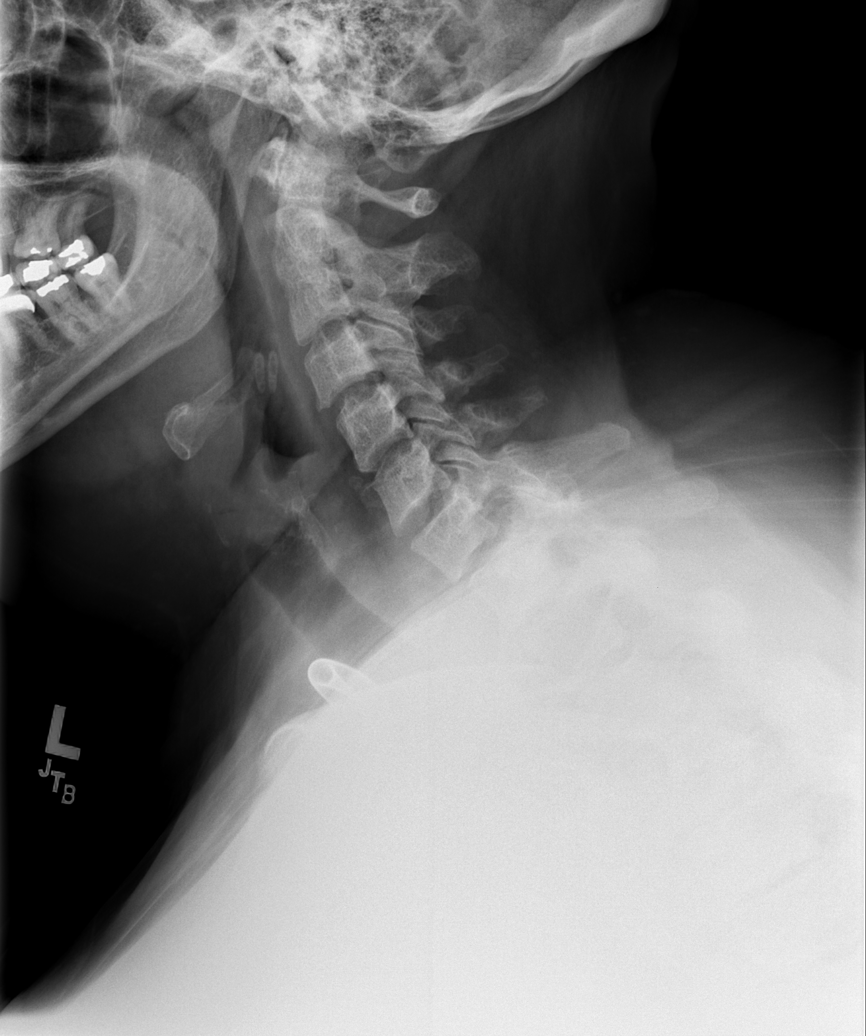

[w swimmers view *]
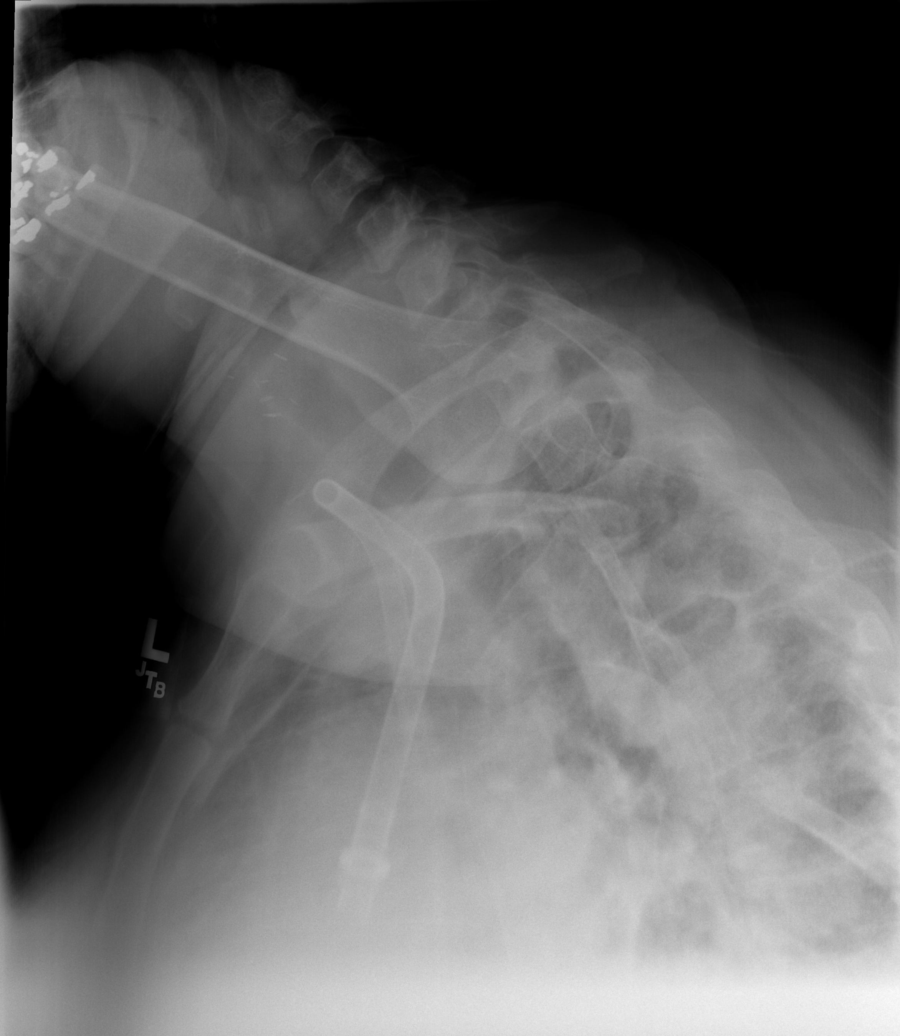

[w c-spine oblique (1 of 2)]
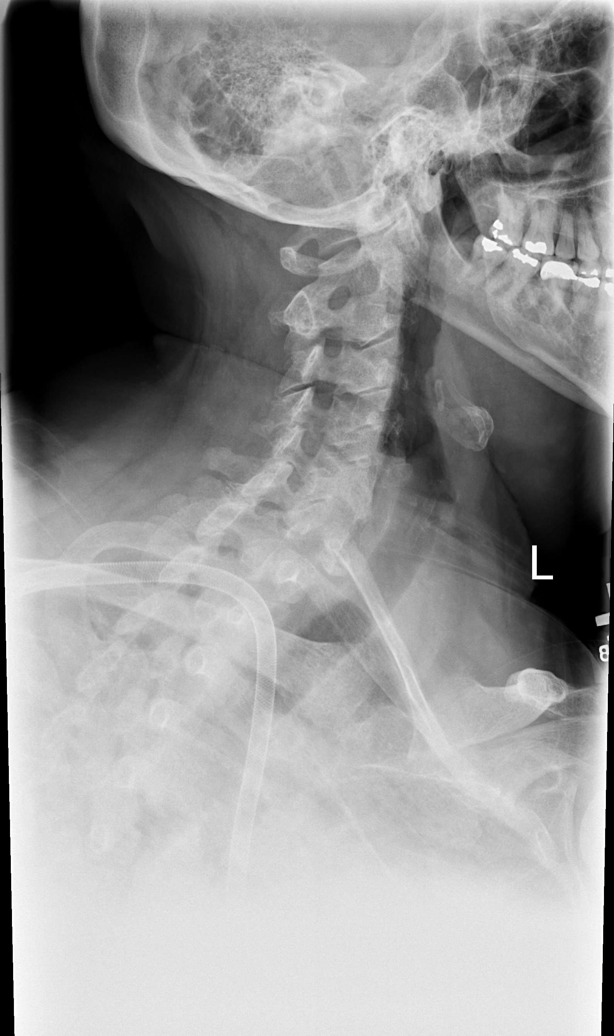

[w c-spine oblique (2 of 2)]
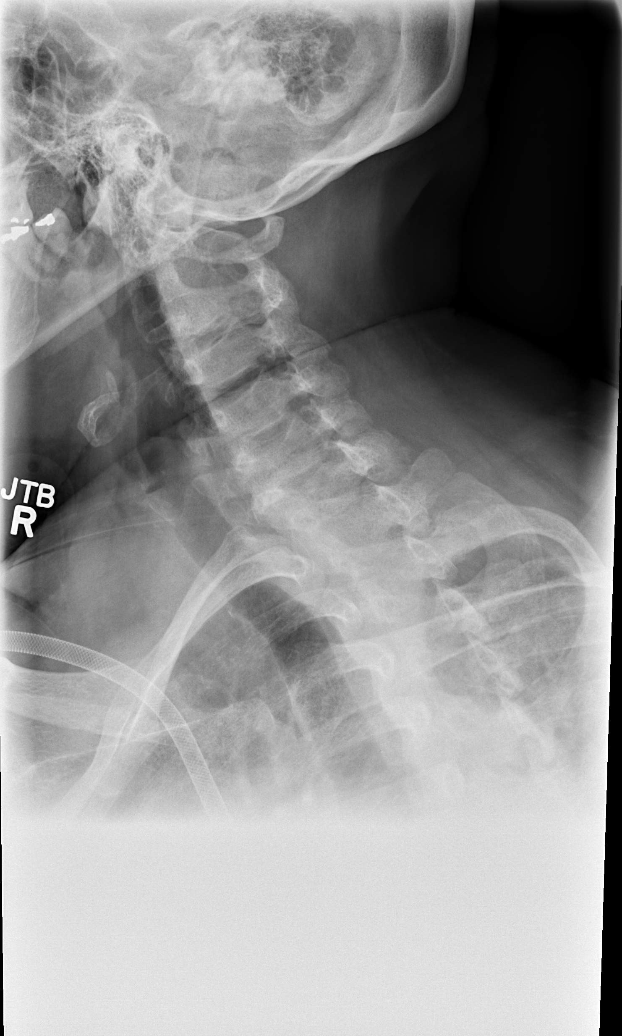

[w c-spine a.p.]
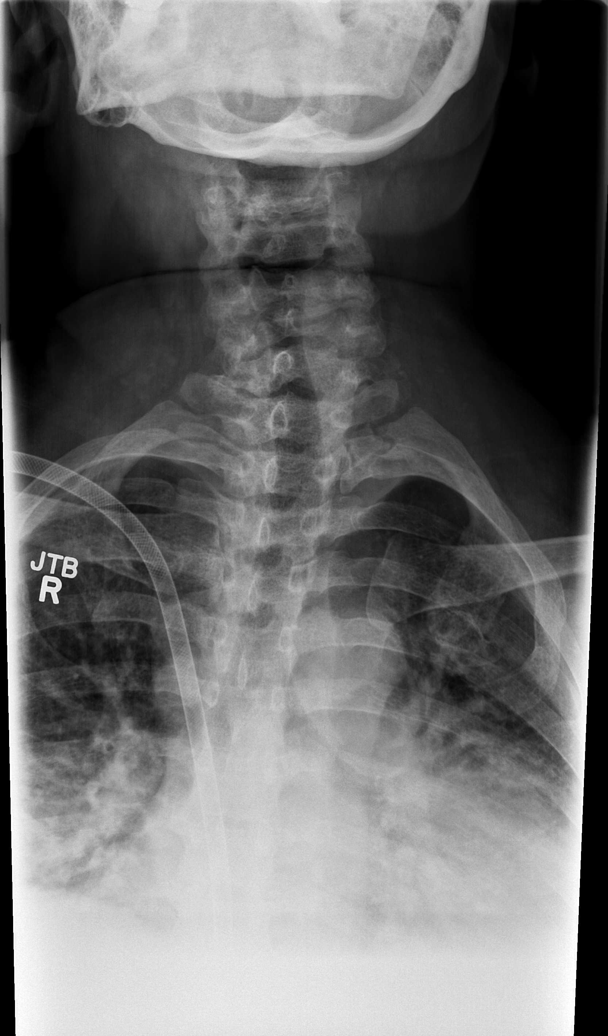

[w c-spine odontoid]
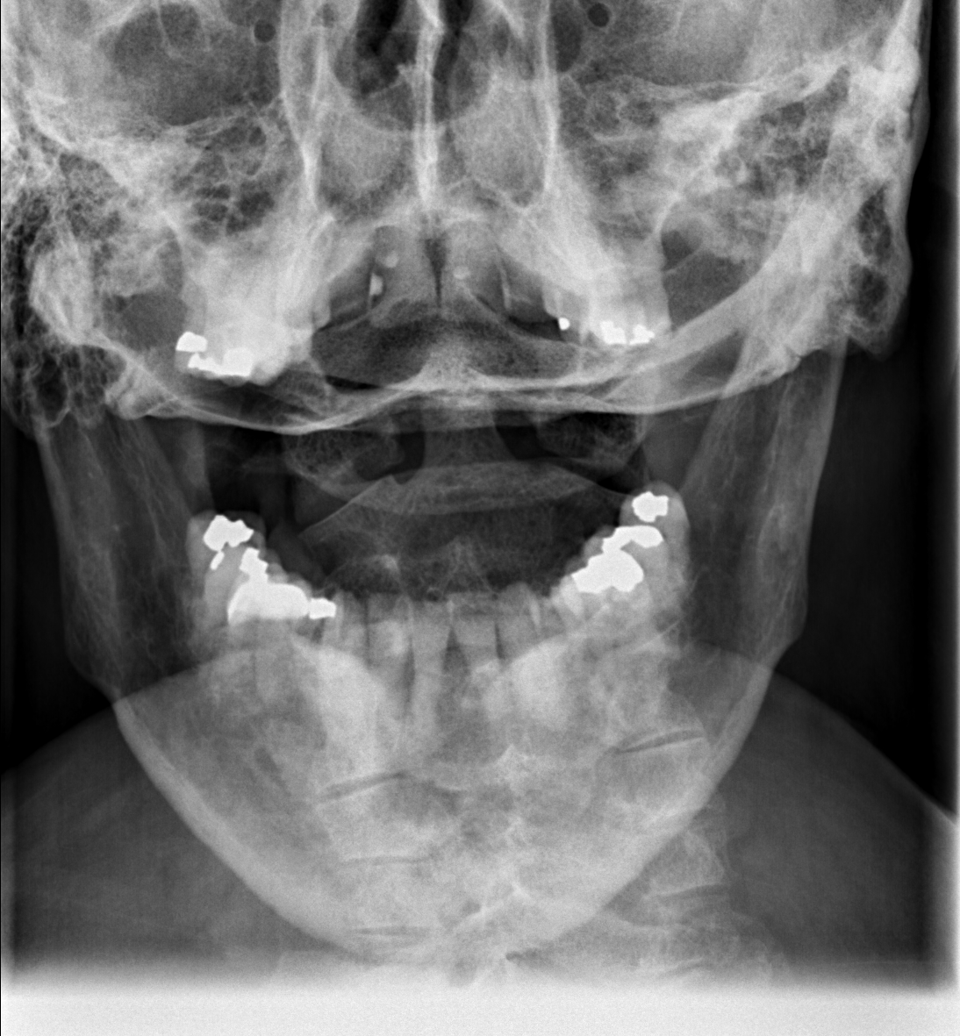

[6 of 6 positions shown; findings below may reference images not displayed]

FINDINGS: AP, lateral, swimmer's, odontoid and oblique images of
the cervical spine were obtained.  Again noted is fusion of the C2
and C3 vertebral bodies.  Alignment of the cervical spine is within
normal limits, including the cervicothoracic junction.
Prevertebral soft tissues are normal. Normal alignment of the upper
thoracic spine.  The patient has a dialysis access in the right
upper chest consistent with [employer] Har[employer] Ronlor device.  No evidence for acute
fracture or dislocation.  Densities in the lower chest region may
be related to low lung volumes.
IMPRESSION: No acute bone abnormality to the cervical spine.

## 2012-01-23 IMAGING — CR DG CHEST 2V
2 series · 2 of 2 positions shown · non-contrast
Comparison: Chest radiograph performed 12/04/2009

CLINICAL DATA: Seizure; shortness of breath.

CHEST - 2 VIEW

[w chest pa]
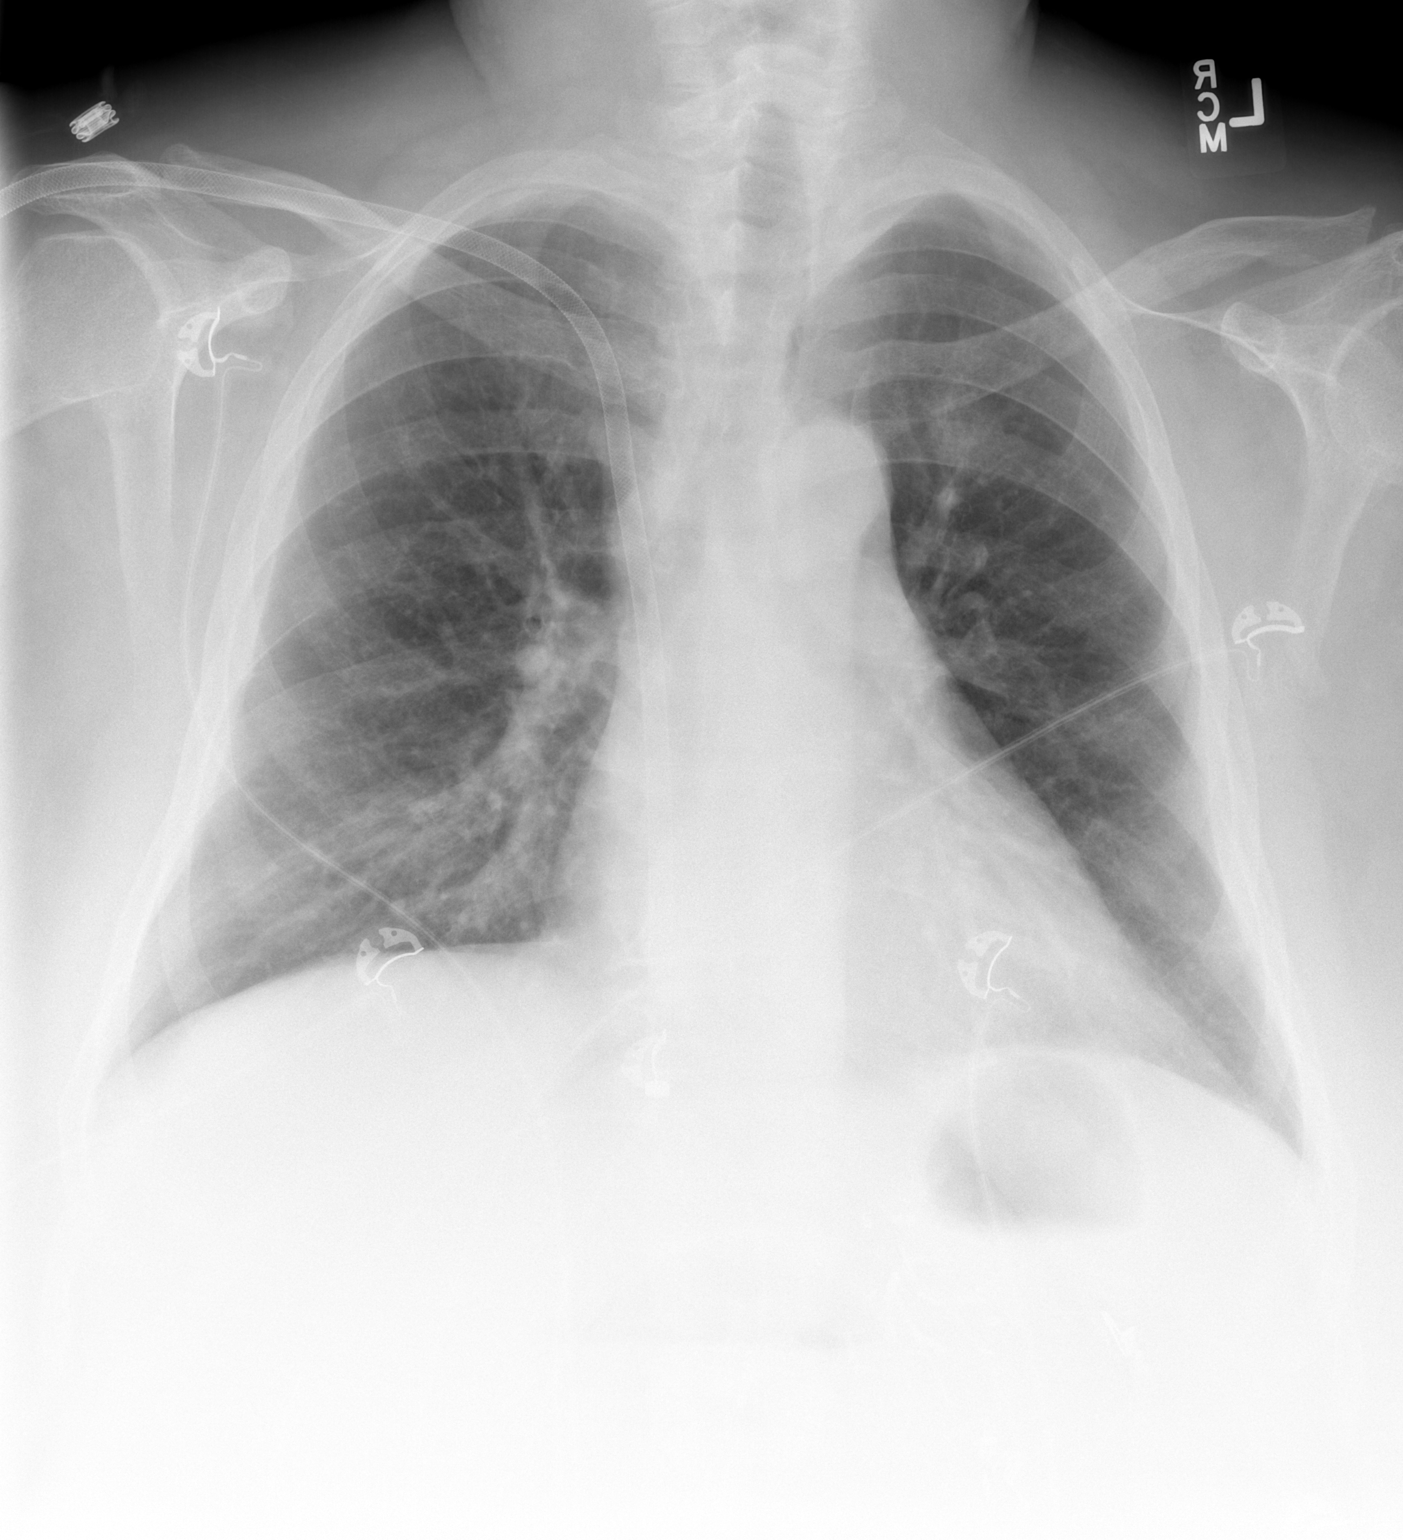

[w chest lat]
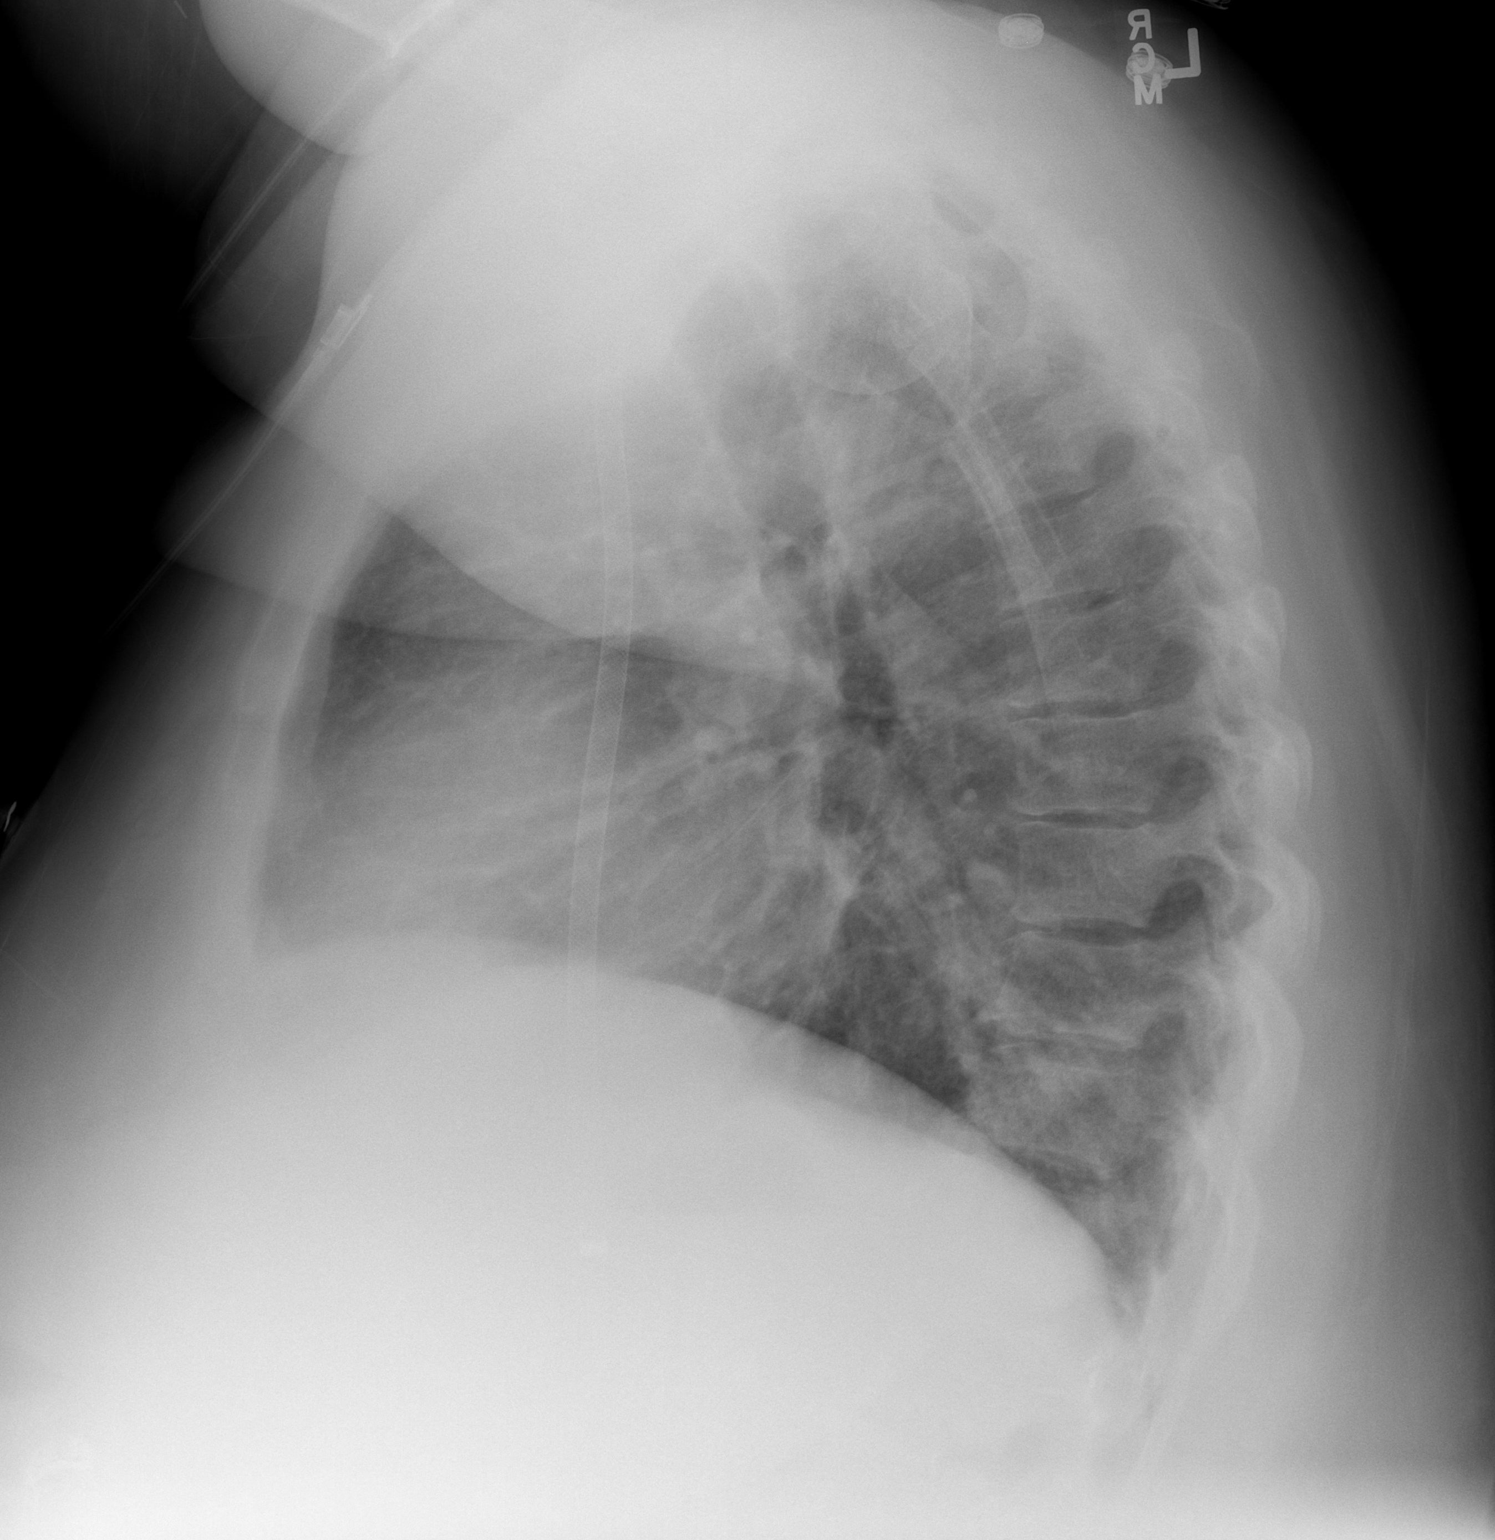

[2 of 2 positions shown; findings below may reference images not displayed]

FINDINGS: The lungs are well-aerated.  There is question of mild
basilar opacity on the lateral view, which may reflect a mild right
basilar pneumonia.  There is no evidence of pleural effusion or
pneumothorax.

The heart is normal in size; the mediastinal contour is within
normal limits.  No acute osseous abnormalities are seen. The
patient's prominent right subclavian line is again noted ending in
the right atrium, approximately 4 cm from the cavoatrial junction.
IMPRESSION: 1.  Mild right basilar opacity raises concern for mild pneumonia,
although atelectasis could have a similar appearance.
2.  Right subclavian line again noted ending in the right atrium,
approximately 4 cm from the cavoatrial junction.

## 2012-01-23 IMAGING — CT CT HEAD W/O CM
1 series · 16 of 30 positions shown, 20 images · non-contrast
Comparison: 01/13/2009

CLINICAL DATA: Seizure.  History of renal failure, obesity,
hypertension.  Seizure disorder.  Tuberous sclerosis.

CT HEAD WITHOUT CONTRAST
TECHNIQUE: Contiguous axial images were obtained from the base of
the skull through the vertex without contrast.

[Series 3: head routine 4.8 h37s · axial · 0.50mm/px · z∈[+85,+247]mm · 16 of 36 slices shown, 20 images]
[im 2/36  brain]
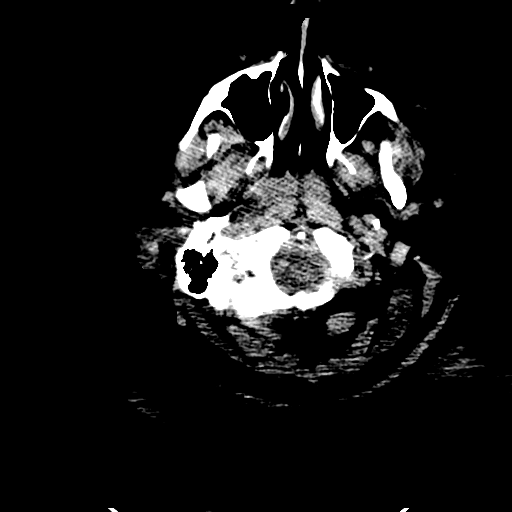
[im 2/36  bone]
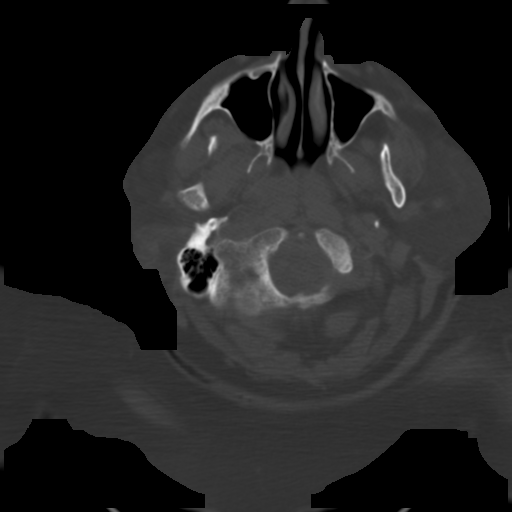
[im 4/36  brain]
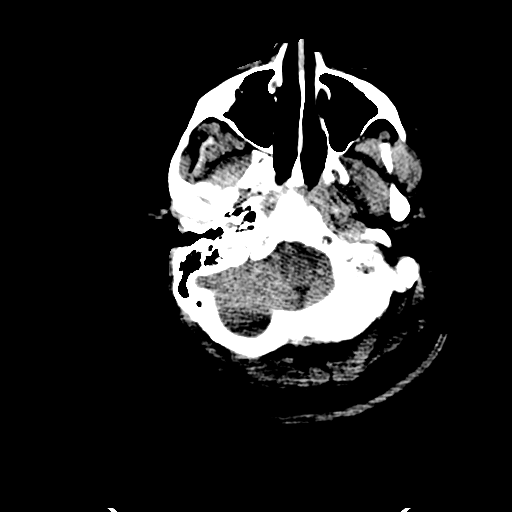
[im 7/36  brain]
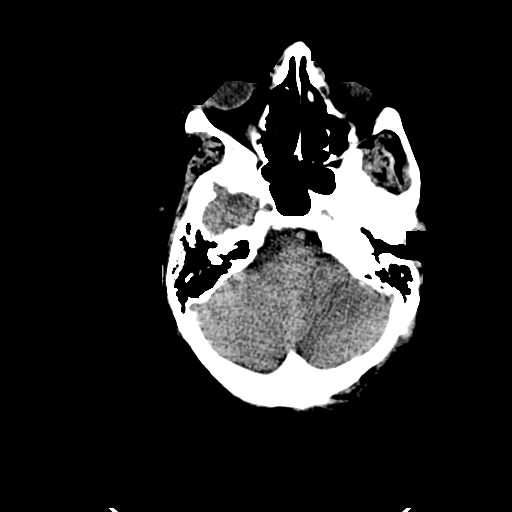
[im 9/36  brain]
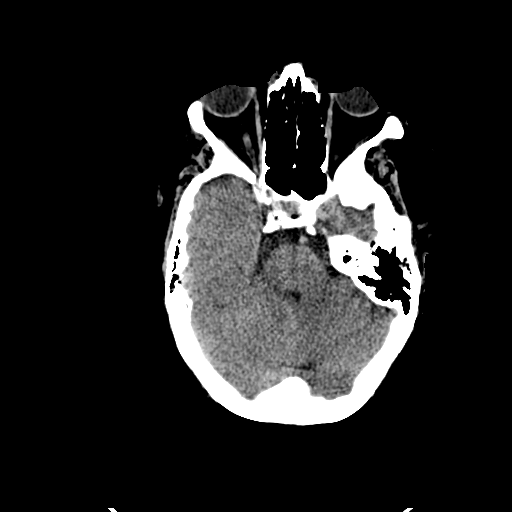
[im 10/36  brain]
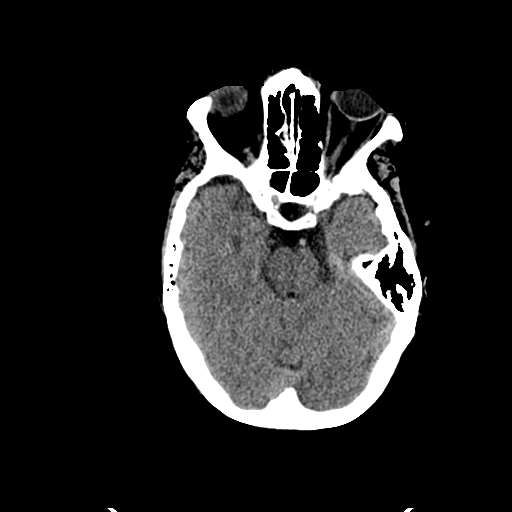
[im 10/36  bone]
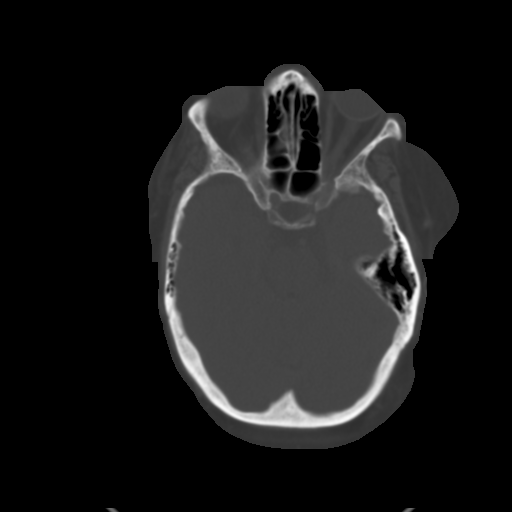
[im 13/36  brain]
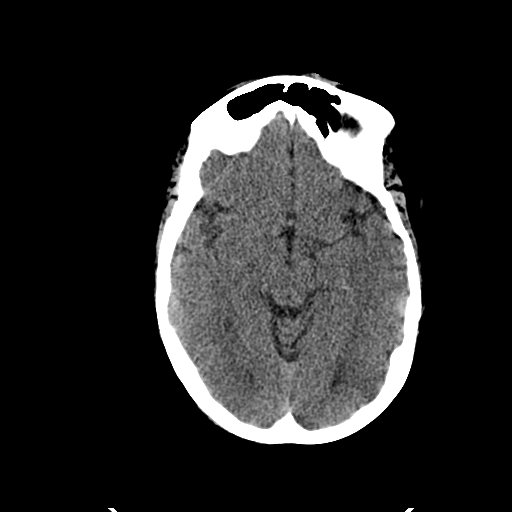
[im 15/36  brain]
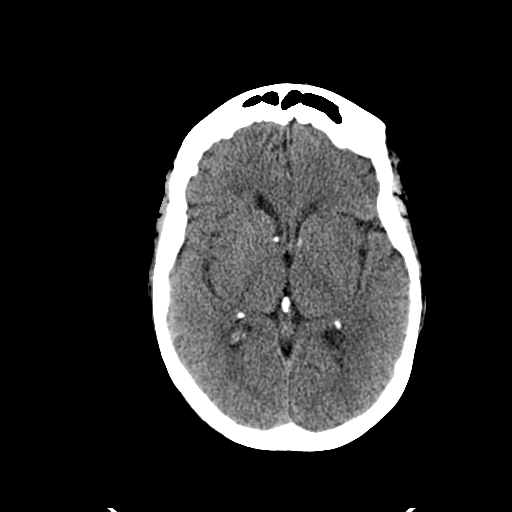
[im 17/36  brain]
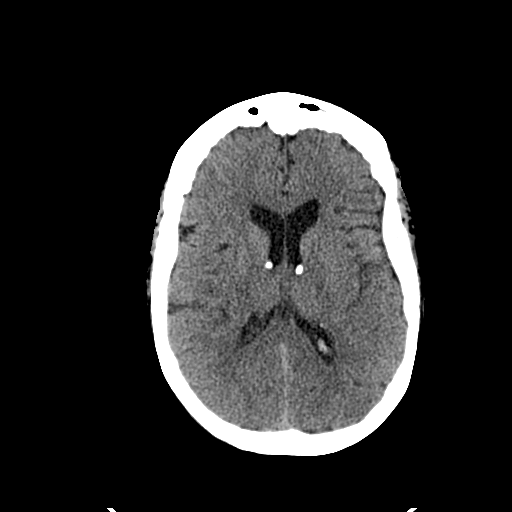
[im 19/36  brain]
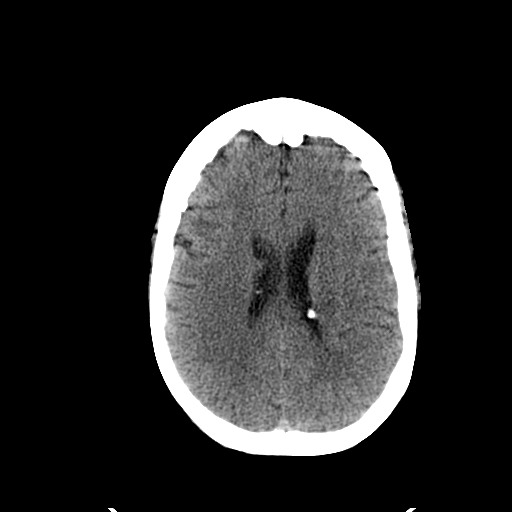
[im 19/36  bone]
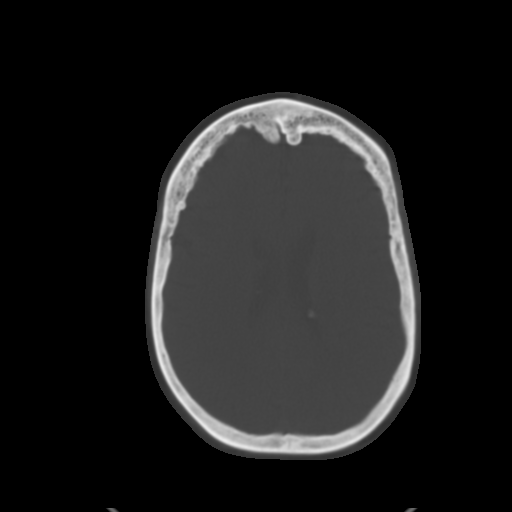
[im 21/36  brain]
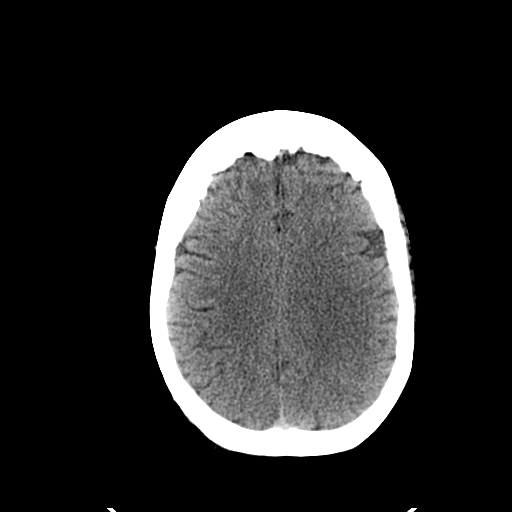
[im 23/36  brain]
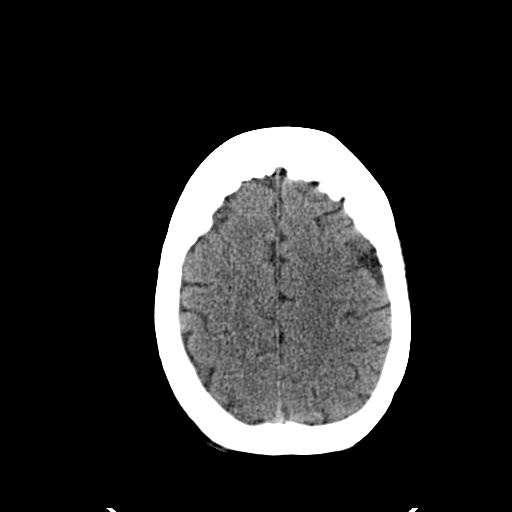
[im 26/36  brain]
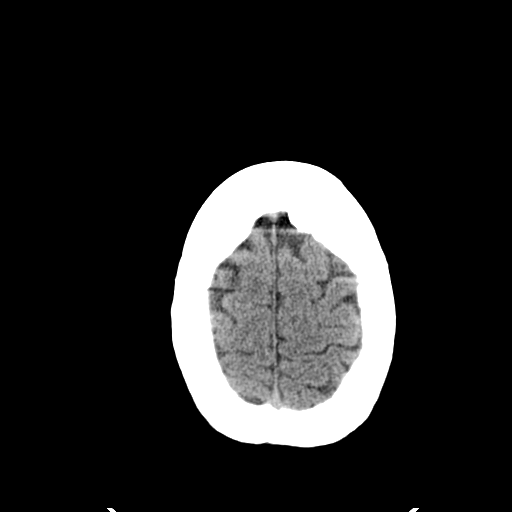
[im 27/36  brain]
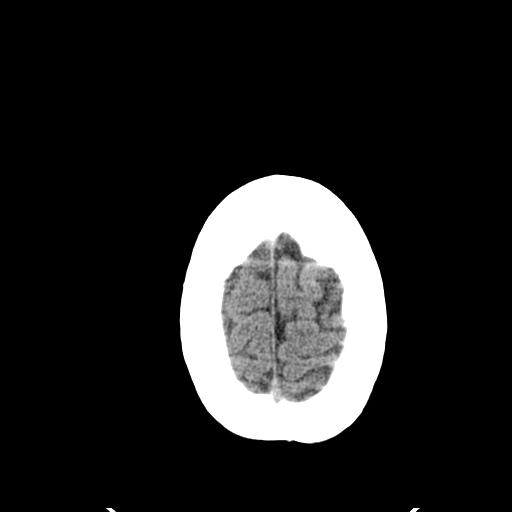
[im 27/36  bone]
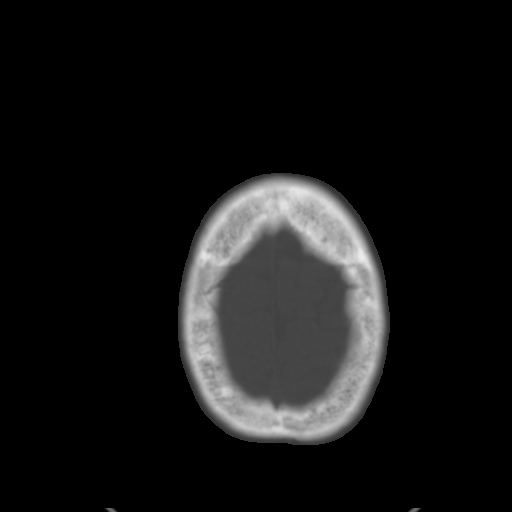
[im 29/36  brain]
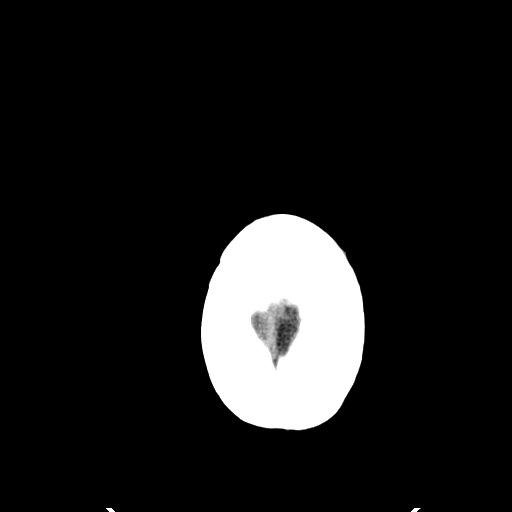
[im 32/36  brain]
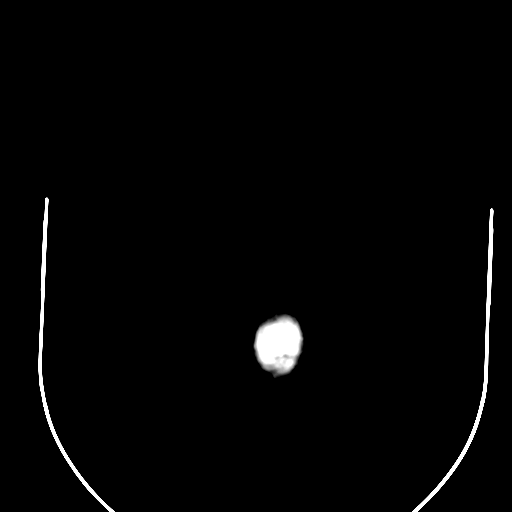
[im 34/36  brain]
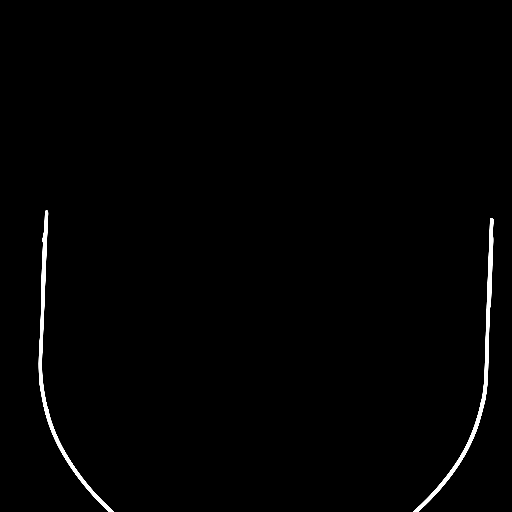

[16 of 30 positions shown; findings below may reference images not displayed]

FINDINGS: Bone windows demonstrate hyperostosis frontalis interna.
Clear paranasal sinuses and mastoid air cells.

Soft tissue windows demonstrate mild degradation, primarily
inferiorly secondary patient size.  Periventricular calcifications,
likely related to the clinical history of tuberous sclerosis.  No
acute mass, hemorrhage, infarct, intra-axial, extra-axial fluid
collection.  No hydrocephalous or mass lesion.
IMPRESSION: 1. No acute intracranial abnormality.
2.  Mildly degraded, secondary patient body habitus.
3.  Periventricular calcifications, likely related to the clinical
history of tuberous sclerosis.

## 2012-01-30 ENCOUNTER — Ambulatory Visit: Payer: Self-pay | Admitting: Vascular Surgery

## 2012-01-30 LAB — CBC
HCT: 36.7 % (ref 35.0–47.0)
HGB: 11.9 g/dL — ABNORMAL LOW (ref 12.0–16.0)
MCV: 105 fL — ABNORMAL HIGH (ref 80–100)
RDW: 16.9 % — ABNORMAL HIGH (ref 11.5–14.5)
WBC: 3.2 10*3/uL — ABNORMAL LOW (ref 3.6–11.0)

## 2012-01-30 LAB — BASIC METABOLIC PANEL
Anion Gap: 9 (ref 7–16)
BUN: 58 mg/dL — ABNORMAL HIGH (ref 7–18)
Chloride: 93 mmol/L — ABNORMAL LOW (ref 98–107)
Co2: 30 mmol/L (ref 21–32)
EGFR (African American): 11 — ABNORMAL LOW
EGFR (Non-African Amer.): 9 — ABNORMAL LOW
Glucose: 77 mg/dL (ref 65–99)
Osmolality: 280 (ref 275–301)
Potassium: 4.8 mmol/L (ref 3.5–5.1)

## 2012-02-08 ENCOUNTER — Ambulatory Visit: Payer: Self-pay | Admitting: Vascular Surgery

## 2012-02-08 LAB — POTASSIUM: Potassium: 4.7 mmol/L (ref 3.5–5.1)

## 2012-02-14 LAB — VANCOMYCIN, RANDOM: Vancomycin, Random: 11 ug/mL

## 2012-02-14 LAB — CBC
MCH: 34.8 pg — ABNORMAL HIGH (ref 26.0–34.0)
MCV: 101 fL — ABNORMAL HIGH (ref 80–100)
Platelet: 97 10*3/uL — ABNORMAL LOW (ref 150–440)
RDW: 14.5 % (ref 11.5–14.5)
WBC: 4.8 10*3/uL (ref 3.6–11.0)

## 2012-02-14 LAB — COMPREHENSIVE METABOLIC PANEL
Albumin: 3.3 g/dL — ABNORMAL LOW (ref 3.4–5.0)
Anion Gap: 10 (ref 7–16)
Calcium, Total: 8.9 mg/dL (ref 8.5–10.1)
Chloride: 97 mmol/L — ABNORMAL LOW (ref 98–107)
Co2: 31 mmol/L (ref 21–32)
EGFR (African American): 15 — ABNORMAL LOW
Glucose: 104 mg/dL — ABNORMAL HIGH (ref 65–99)
Osmolality: 287 (ref 275–301)
Potassium: 4.1 mmol/L (ref 3.5–5.1)
SGOT(AST): 35 U/L (ref 15–37)
SGPT (ALT): 19 U/L
Sodium: 138 mmol/L (ref 136–145)

## 2012-02-14 LAB — TROPONIN I: Troponin-I: 0.04 ng/mL

## 2012-02-14 LAB — TSH: Thyroid Stimulating Horm: 0.267 u[IU]/mL — ABNORMAL LOW

## 2012-02-15 ENCOUNTER — Observation Stay: Payer: Self-pay | Admitting: Internal Medicine

## 2012-02-15 LAB — T4, FREE: Free Thyroxine: 1.11 ng/dL (ref 0.76–1.46)

## 2012-02-15 LAB — TROPONIN I: Troponin-I: 0.05 ng/mL

## 2012-02-16 LAB — CBC WITH DIFFERENTIAL/PLATELET
Basophil #: 0 10*3/uL (ref 0.0–0.1)
Basophil %: 1.1 %
Eosinophil %: 2.9 %
HCT: 29.1 % — ABNORMAL LOW (ref 35.0–47.0)
HGB: 9.8 g/dL — ABNORMAL LOW (ref 12.0–16.0)
Lymphocyte #: 0.6 10*3/uL — ABNORMAL LOW (ref 1.0–3.6)
MCH: 34.3 pg — ABNORMAL HIGH (ref 26.0–34.0)
MCV: 102 fL — ABNORMAL HIGH (ref 80–100)
Monocyte %: 9.7 %
Neutrophil #: 1.2 10*3/uL — ABNORMAL LOW (ref 1.4–6.5)
Platelet: 83 10*3/uL — ABNORMAL LOW (ref 150–440)
RDW: 14.4 % (ref 11.5–14.5)
WBC: 2.1 10*3/uL — ABNORMAL LOW (ref 3.6–11.0)

## 2012-02-19 LAB — CULTURE, BLOOD (SINGLE)

## 2012-02-27 ENCOUNTER — Inpatient Hospital Stay: Payer: Self-pay | Admitting: Internal Medicine

## 2012-02-27 DIAGNOSIS — I498 Other specified cardiac arrhythmias: Secondary | ICD-10-CM

## 2012-02-27 LAB — CBC
HCT: 33.9 % — ABNORMAL LOW (ref 35.0–47.0)
MCH: 33.1 pg (ref 26.0–34.0)
MCHC: 32.8 g/dL (ref 32.0–36.0)
MCV: 101 fL — ABNORMAL HIGH (ref 80–100)
Platelet: 184 10*3/uL (ref 150–440)
RDW: 14.7 % — ABNORMAL HIGH (ref 11.5–14.5)
WBC: 6.6 10*3/uL (ref 3.6–11.0)

## 2012-02-27 LAB — COMPREHENSIVE METABOLIC PANEL
Alkaline Phosphatase: 268 U/L — ABNORMAL HIGH (ref 50–136)
Bilirubin,Total: 0.5 mg/dL (ref 0.2–1.0)
Calcium, Total: 11.9 mg/dL — ABNORMAL HIGH (ref 8.5–10.1)
Co2: 23 mmol/L (ref 21–32)
Creatinine: 7.94 mg/dL — ABNORMAL HIGH (ref 0.60–1.30)
Glucose: 166 mg/dL — ABNORMAL HIGH (ref 65–99)
SGPT (ALT): 15 U/L (ref 12–78)
Total Protein: 7.6 g/dL (ref 6.4–8.2)

## 2012-02-27 LAB — PROTIME-INR: Prothrombin Time: 13.1 secs (ref 11.5–14.7)

## 2012-02-27 LAB — MAGNESIUM: Magnesium: 3.2 mg/dL — ABNORMAL HIGH

## 2012-02-27 LAB — TROPONIN I: Troponin-I: 0.03 ng/mL

## 2012-02-27 LAB — CK TOTAL AND CKMB (NOT AT ARMC): CK-MB: 1.3 ng/mL (ref 0.5–3.6)

## 2012-02-28 DIAGNOSIS — I517 Cardiomegaly: Secondary | ICD-10-CM

## 2012-02-28 LAB — CBC WITH DIFFERENTIAL/PLATELET
Basophil #: 0 10*3/uL (ref 0.0–0.1)
Basophil %: 1.1 %
Eosinophil #: 0.1 10*3/uL (ref 0.0–0.7)
Lymphocyte #: 0.7 10*3/uL — ABNORMAL LOW (ref 1.0–3.6)
Lymphocyte %: 22.5 %
MCHC: 33.5 g/dL (ref 32.0–36.0)
Neutrophil %: 66.8 %
Platelet: 144 10*3/uL — ABNORMAL LOW (ref 150–440)
RBC: 2.86 10*6/uL — ABNORMAL LOW (ref 3.80–5.20)
RDW: 14.3 % (ref 11.5–14.5)

## 2012-02-28 LAB — COMPREHENSIVE METABOLIC PANEL
Alkaline Phosphatase: 237 U/L — ABNORMAL HIGH (ref 50–136)
BUN: 59 mg/dL — ABNORMAL HIGH (ref 7–18)
Bilirubin,Total: 0.4 mg/dL (ref 0.2–1.0)
Calcium, Total: 8.9 mg/dL (ref 8.5–10.1)
Co2: 29 mmol/L (ref 21–32)
Creatinine: 5.38 mg/dL — ABNORMAL HIGH (ref 0.60–1.30)
EGFR (Non-African Amer.): 8 — ABNORMAL LOW
Glucose: 87 mg/dL (ref 65–99)
Osmolality: 280 (ref 275–301)
Potassium: 4.7 mmol/L (ref 3.5–5.1)
SGOT(AST): 19 U/L (ref 15–37)
SGPT (ALT): 14 U/L (ref 12–78)

## 2012-02-28 LAB — CK TOTAL AND CKMB (NOT AT ARMC): CK, Total: 19 U/L — ABNORMAL LOW (ref 21–215)

## 2012-02-28 LAB — TROPONIN I: Troponin-I: 0.05 ng/mL

## 2012-03-02 LAB — CK TOTAL AND CKMB (NOT AT ARMC)
CK, Total: 32 U/L (ref 21–215)
CK-MB: 0.9 ng/mL (ref 0.5–3.6)

## 2012-03-03 LAB — CULTURE, BLOOD (SINGLE)

## 2012-03-10 ENCOUNTER — Telehealth: Payer: Self-pay | Admitting: Internal Medicine

## 2012-03-10 ENCOUNTER — Ambulatory Visit: Payer: Medicare Other | Admitting: Family Medicine

## 2012-03-10 MED ORDER — BENZONATATE 200 MG PO CAPS
200.0000 mg | ORAL_CAPSULE | Freq: Three times a day (TID) | ORAL | Status: AC | PRN
Start: 2012-03-10 — End: 2012-03-17

## 2012-03-10 NOTE — Telephone Encounter (Signed)
Pt called and said that she is living in [place] right now, but would like to get Dr Lovell Sheehan assistance is getting pt moved to the [facility] or [address] in [place].

## 2012-03-10 NOTE — Telephone Encounter (Signed)
Pt walked in and states she has hacking cough at night for the past 2 weeks.

## 2012-03-10 NOTE — Telephone Encounter (Signed)
UNABLE TO CONTACT PT- ALWAYS OUT OF ROOM-- we really cant help[ with a move- will discuwss at next ov

## 2012-03-10 NOTE — Telephone Encounter (Signed)
Per dr Lovell Sheehan-  May have tessalon perles #30= 1 tid prns cough

## 2012-03-19 IMAGING — CR DG CHEST 2V
2 series · 2 of 2 positions shown · non-contrast
Comparison: 01/22/2010

CLINICAL DATA: Dialysis patient.  Seizure.

CHEST - 2 VIEW

[w chest pa]
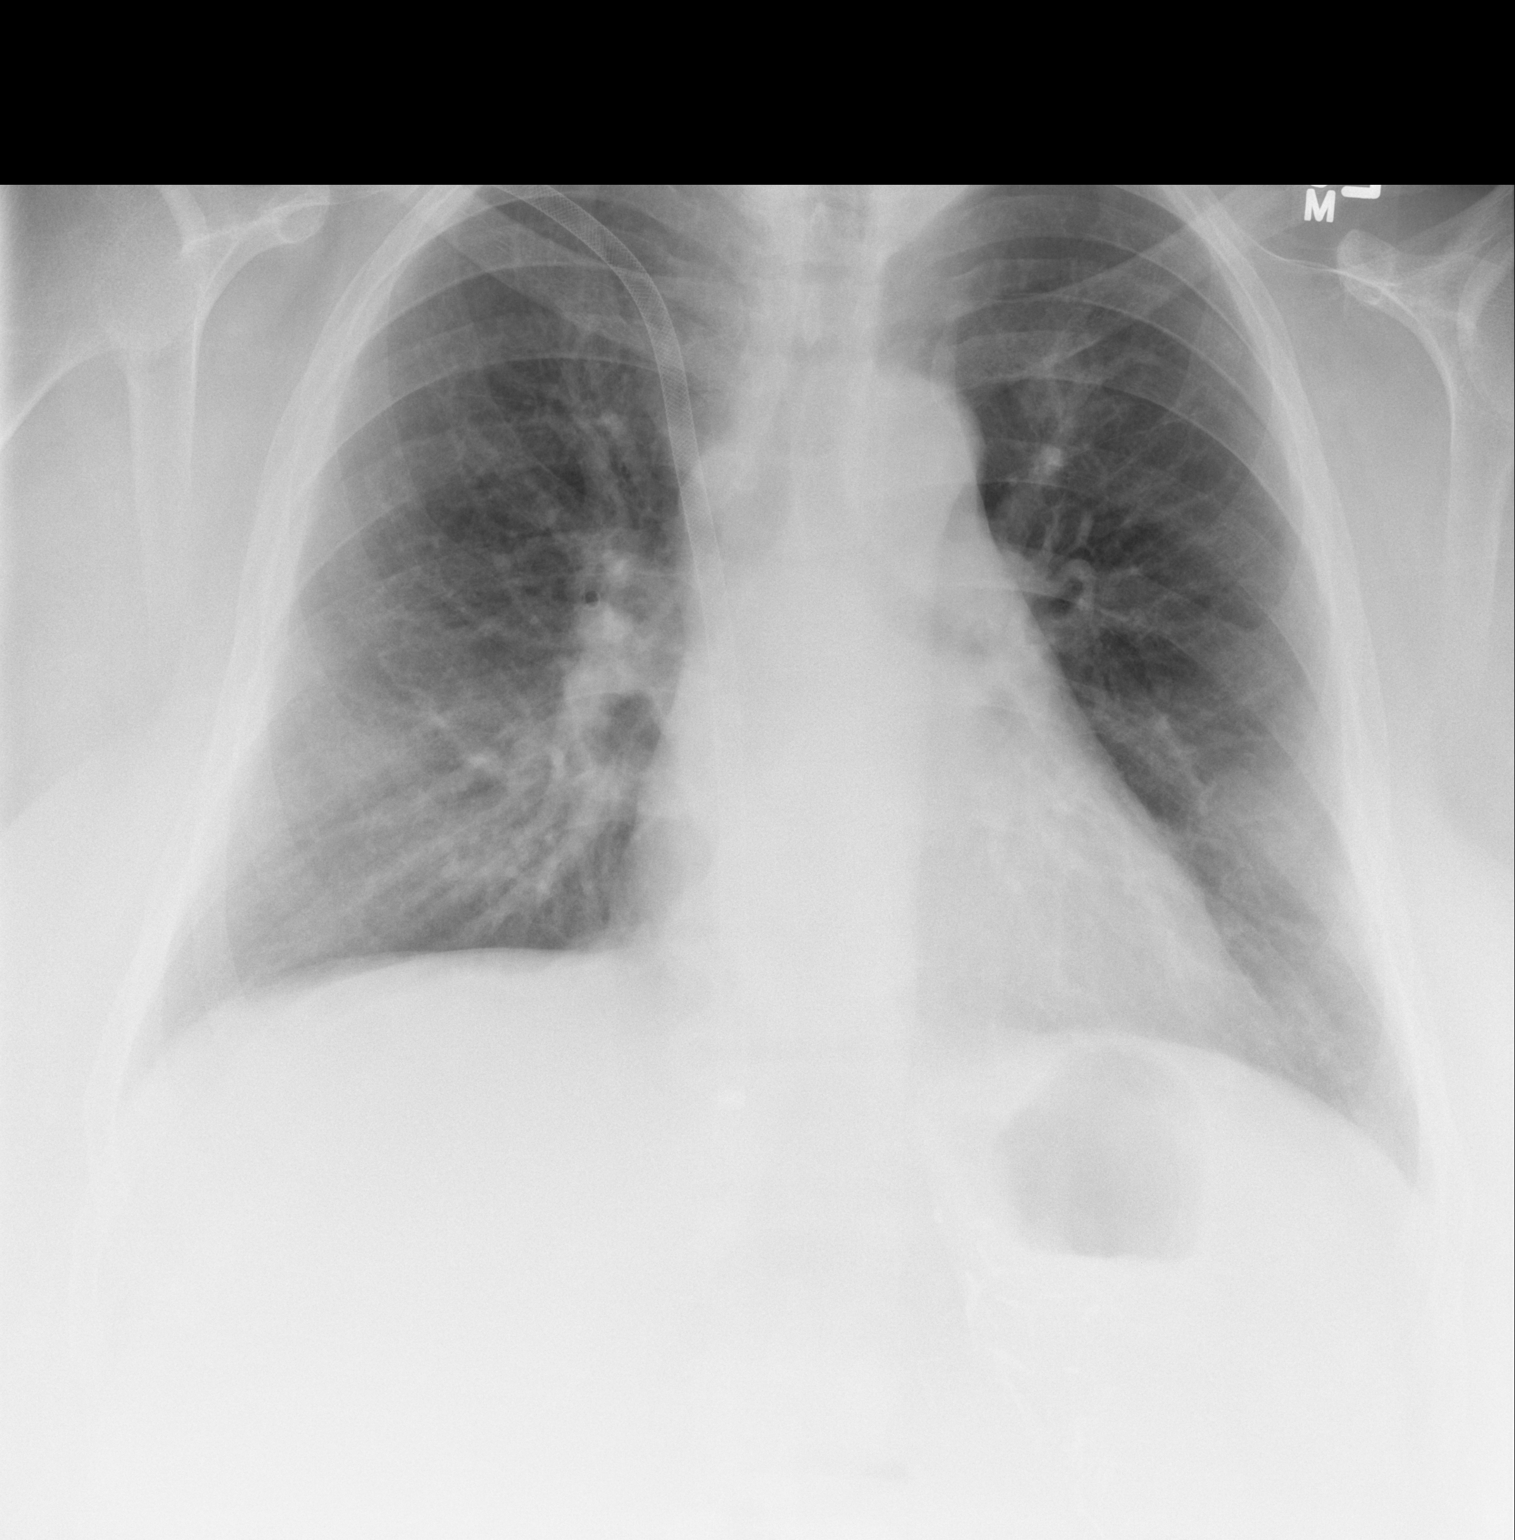

[w chest lat]
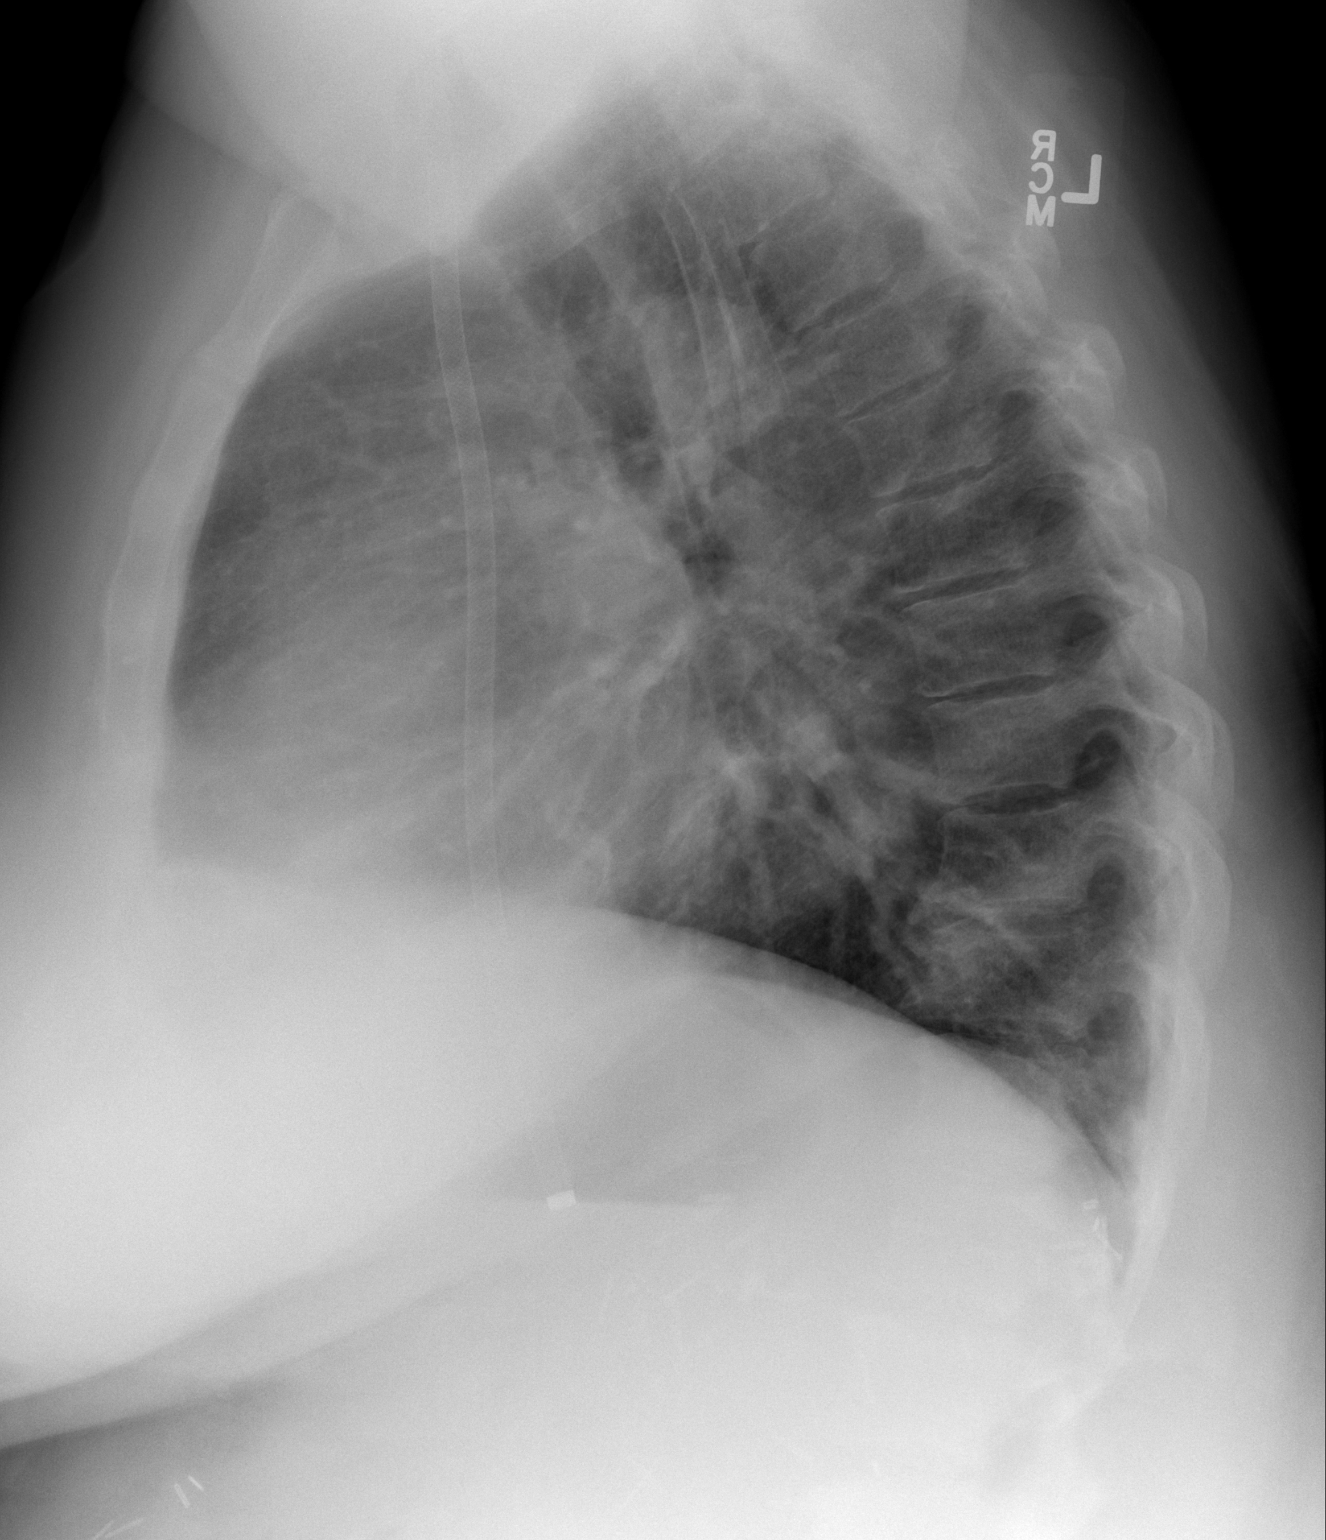

[2 of 2 positions shown; findings below may reference images not displayed]

FINDINGS: The patient has right-sided subclavian dialysis catheter.
Tip overlies the lower right atrium.  Heart size is normal.  There
is mild pulmonary vascular congestion without overt edema.  There
are no focal consolidations or pleural effusions.  Surgical clips
are identified in the left upper quadrant.  Degenerative changes
are seen in the spine.
IMPRESSION: Mild vascular congestion.  No overt pulmonary edema.

## 2012-04-01 ENCOUNTER — Inpatient Hospital Stay: Payer: Self-pay | Admitting: Internal Medicine

## 2012-04-01 ENCOUNTER — Ambulatory Visit: Payer: Self-pay | Admitting: Internal Medicine

## 2012-04-01 LAB — BASIC METABOLIC PANEL
Anion Gap: 11 (ref 7–16)
Anion Gap: 10 (ref 7–16)
BUN: 114 mg/dL — ABNORMAL HIGH (ref 7–18)
BUN: 68 mg/dL — ABNORMAL HIGH (ref 7–18)
Calcium, Total: 9.2 mg/dL (ref 8.5–10.1)
Chloride: 97 mmol/L — ABNORMAL LOW (ref 98–107)
Co2: 24 mmol/L (ref 21–32)
Creatinine: 7.75 mg/dL — ABNORMAL HIGH (ref 0.60–1.30)
EGFR (African American): 10 — ABNORMAL LOW
EGFR (Non-African Amer.): 5 — ABNORMAL LOW
EGFR (Non-African Amer.): 8 — ABNORMAL LOW
Glucose: 129 mg/dL — ABNORMAL HIGH (ref 65–99)
Glucose: 94 mg/dL (ref 65–99)
Osmolality: 302 (ref 275–301)
Osmolality: 293 (ref 275–301)
Potassium: 5.1 mmol/L (ref 3.5–5.1)

## 2012-04-01 LAB — CBC
HCT: 39.2 % (ref 35.0–47.0)
MCH: 34.2 pg — ABNORMAL HIGH (ref 26.0–34.0)
MCHC: 32.9 g/dL (ref 32.0–36.0)
MCV: 104 fL — ABNORMAL HIGH (ref 80–100)
Platelet: 182 10*3/uL (ref 150–440)
RBC: 3.78 10*6/uL — ABNORMAL LOW (ref 3.80–5.20)
RDW: 16 % — ABNORMAL HIGH (ref 11.5–14.5)

## 2012-04-01 LAB — PHOSPHORUS: Phosphorus: 7.2 mg/dL — ABNORMAL HIGH (ref 2.5–4.9)

## 2012-04-01 LAB — COMPREHENSIVE METABOLIC PANEL
Albumin: 3.8 g/dL (ref 3.4–5.0)
Alkaline Phosphatase: 271 U/L — ABNORMAL HIGH (ref 50–136)
Bilirubin,Total: 0.5 mg/dL (ref 0.2–1.0)
Co2: 21 mmol/L (ref 21–32)
Creatinine: 7.36 mg/dL — ABNORMAL HIGH (ref 0.60–1.30)
EGFR (Non-African Amer.): 6 — ABNORMAL LOW
Glucose: 168 mg/dL — ABNORMAL HIGH (ref 65–99)
SGPT (ALT): 29 U/L (ref 12–78)
Total Protein: 7.9 g/dL (ref 6.4–8.2)

## 2012-04-01 LAB — CK TOTAL AND CKMB (NOT AT ARMC): CK, Total: 55 U/L (ref 21–215)

## 2012-04-01 LAB — PROTIME-INR
INR: 1
Prothrombin Time: 13.2 secs (ref 11.5–14.7)

## 2012-04-01 LAB — APTT: Activated PTT: 31.7 secs (ref 23.6–35.9)

## 2012-04-01 LAB — TROPONIN I: Troponin-I: 0.04 ng/mL

## 2012-04-02 LAB — BASIC METABOLIC PANEL
Anion Gap: 11 (ref 7–16)
BUN: 68 mg/dL — ABNORMAL HIGH (ref 7–18)
Calcium, Total: 8.6 mg/dL (ref 8.5–10.1)
EGFR (African American): 10 — ABNORMAL LOW
EGFR (Non-African Amer.): 9 — ABNORMAL LOW
Potassium: 4.4 mmol/L (ref 3.5–5.1)
Sodium: 137 mmol/L (ref 136–145)

## 2012-04-02 LAB — LIPID PANEL
Cholesterol: 128 mg/dL (ref 0–200)
HDL Cholesterol: 69 mg/dL — ABNORMAL HIGH (ref 40–60)
Triglycerides: 76 mg/dL (ref 0–200)

## 2012-04-02 LAB — CBC WITH DIFFERENTIAL/PLATELET
Basophil #: 0 10*3/uL (ref 0.0–0.1)
Eosinophil %: 2.1 %
HCT: 28.8 % — ABNORMAL LOW (ref 35.0–47.0)
HGB: 9.4 g/dL — ABNORMAL LOW (ref 12.0–16.0)
Lymphocyte #: 0.9 10*3/uL — ABNORMAL LOW (ref 1.0–3.6)
MCH: 33.3 pg (ref 26.0–34.0)
MCV: 102 fL — ABNORMAL HIGH (ref 80–100)
Monocyte #: 0.3 x10 3/mm (ref 0.2–0.9)
Monocyte %: 7.4 %
Neutrophil #: 2.8 10*3/uL (ref 1.4–6.5)
Platelet: 135 10*3/uL — ABNORMAL LOW (ref 150–440)
RDW: 15.8 % — ABNORMAL HIGH (ref 11.5–14.5)
WBC: 4.2 10*3/uL (ref 3.6–11.0)

## 2012-04-03 ENCOUNTER — Telehealth: Payer: Self-pay | Admitting: Internal Medicine

## 2012-04-03 LAB — BASIC METABOLIC PANEL
Anion Gap: 16 (ref 7–16)
BUN: 87 mg/dL — ABNORMAL HIGH (ref 7–18)
Calcium, Total: 8.6 mg/dL (ref 8.5–10.1)
Creatinine: 6.95 mg/dL — ABNORMAL HIGH (ref 0.60–1.30)
EGFR (African American): 7 — ABNORMAL LOW
EGFR (Non-African Amer.): 6 — ABNORMAL LOW
Glucose: 77 mg/dL (ref 65–99)
Sodium: 136 mmol/L (ref 136–145)

## 2012-04-03 LAB — CBC WITH DIFFERENTIAL/PLATELET
Basophil #: 0 10*3/uL (ref 0.0–0.1)
Eosinophil #: 0.1 10*3/uL (ref 0.0–0.7)
Lymphocyte #: 1 10*3/uL (ref 1.0–3.6)
MCH: 34.3 pg — ABNORMAL HIGH (ref 26.0–34.0)
MCHC: 33.4 g/dL (ref 32.0–36.0)
MCV: 103 fL — ABNORMAL HIGH (ref 80–100)
Platelet: 107 10*3/uL — ABNORMAL LOW (ref 150–440)
RDW: 15.5 % — ABNORMAL HIGH (ref 11.5–14.5)
WBC: 2.8 10*3/uL — ABNORMAL LOW (ref 3.6–11.0)

## 2012-04-03 LAB — PHOSPHORUS: Phosphorus: 5 mg/dL — ABNORMAL HIGH (ref 2.5–4.9)

## 2012-04-03 NOTE — Telephone Encounter (Signed)
Pt called and is req to get assistance in getting moved to [facility]. Pt is currently living at [facility] in [place]. Pt is in the hosp at [facility] in [place]. Pls call Dr. Clarice Pole at [facility]. [phone].

## 2012-04-03 NOTE — Telephone Encounter (Signed)
Pt has to work that out with Child psychotherapist at [employer]

## 2012-04-04 LAB — BASIC METABOLIC PANEL
BUN: 71 mg/dL — ABNORMAL HIGH (ref 7–18)
Calcium, Total: 8.8 mg/dL (ref 8.5–10.1)
Creatinine: 5.87 mg/dL — ABNORMAL HIGH (ref 0.60–1.30)
EGFR (African American): 9 — ABNORMAL LOW
EGFR (Non-African Amer.): 8 — ABNORMAL LOW
Glucose: 83 mg/dL (ref 65–99)
Potassium: 4.7 mmol/L (ref 3.5–5.1)
Sodium: 133 mmol/L — ABNORMAL LOW (ref 136–145)

## 2012-04-04 LAB — CBC WITH DIFFERENTIAL/PLATELET
Basophil #: 0 10*3/uL (ref 0.0–0.1)
Basophil %: 0.9 %
Eosinophil %: 5.1 %
HCT: 29.2 % — ABNORMAL LOW (ref 35.0–47.0)
Lymphocyte #: 0.7 10*3/uL — ABNORMAL LOW (ref 1.0–3.6)
MCV: 103 fL — ABNORMAL HIGH (ref 80–100)
Monocyte %: 9.9 %
Neutrophil #: 1.6 10*3/uL (ref 1.4–6.5)
Platelet: 101 10*3/uL — ABNORMAL LOW (ref 150–440)
RDW: 15.4 % — ABNORMAL HIGH (ref 11.5–14.5)

## 2012-04-13 IMAGING — XA IR AV DIALYSIS GRAFT DECLOT
1 series · 12 of 24 positions shown · non-contrast
Comparison: none

CLINICAL DATA: Occluded right-sided Claudio Anibal dialysis graft.

[Series 1: run · 12 of 35 slices shown]
[im 2/35]
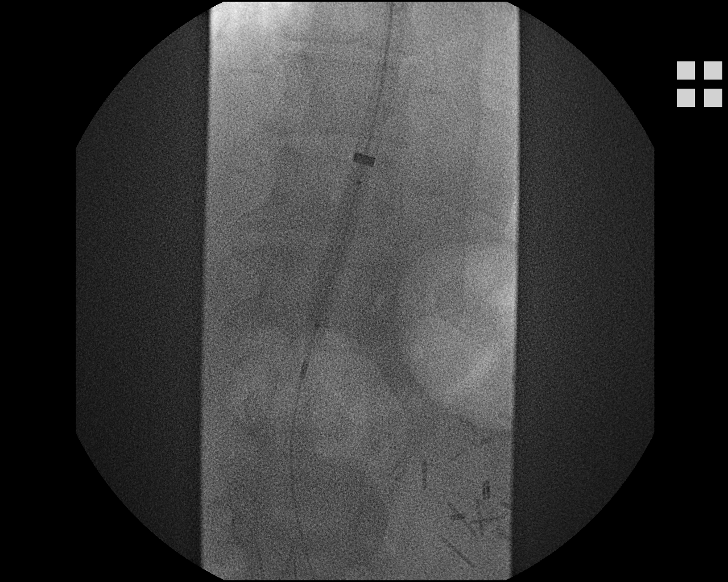
[im 5/35]
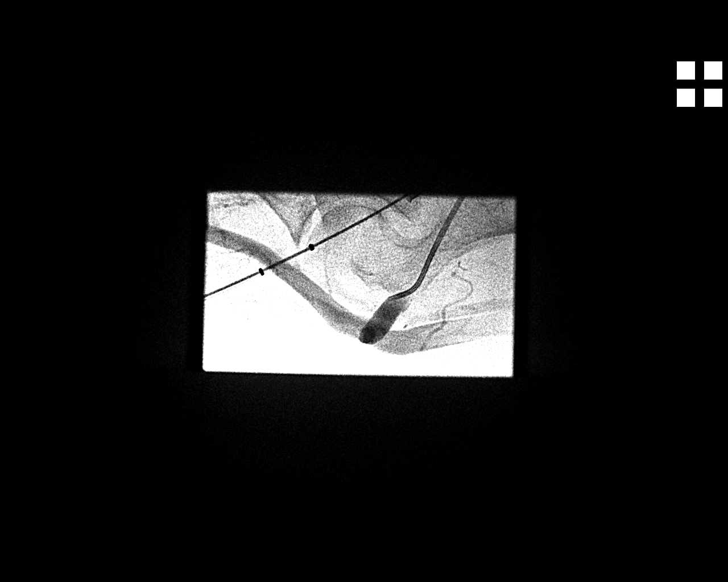
[im 8/35]
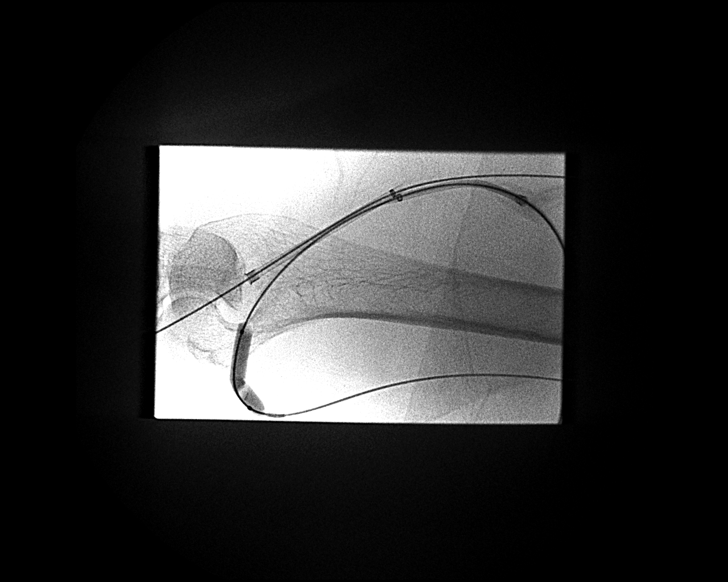
[im 11/35]
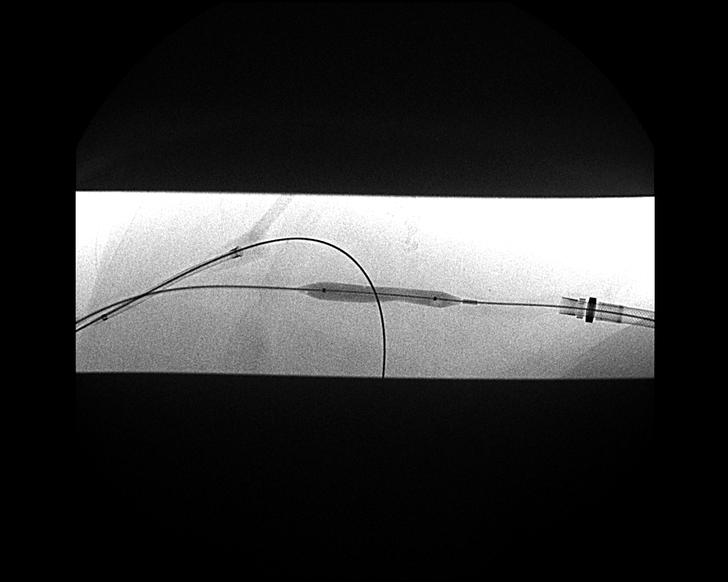
[im 14/35]
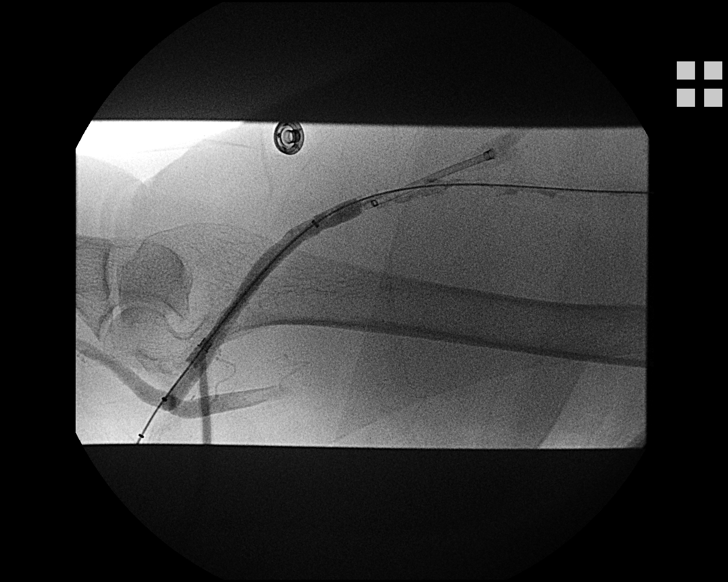
[im 17/35]
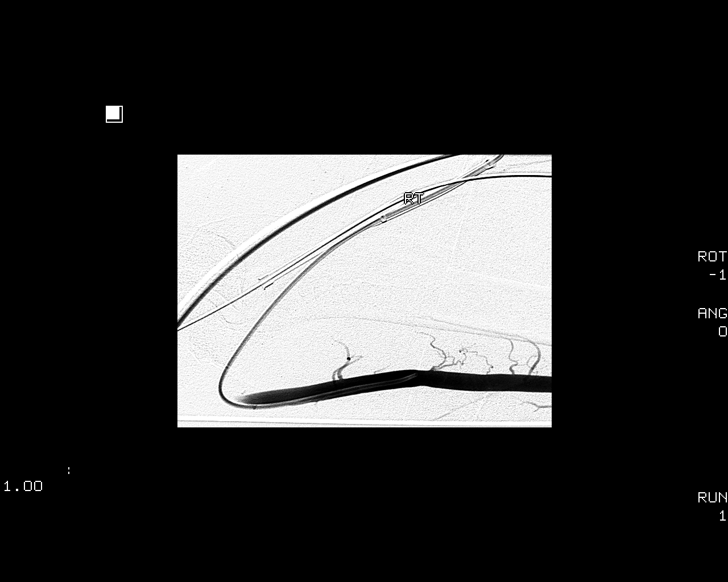
[im 20/35]
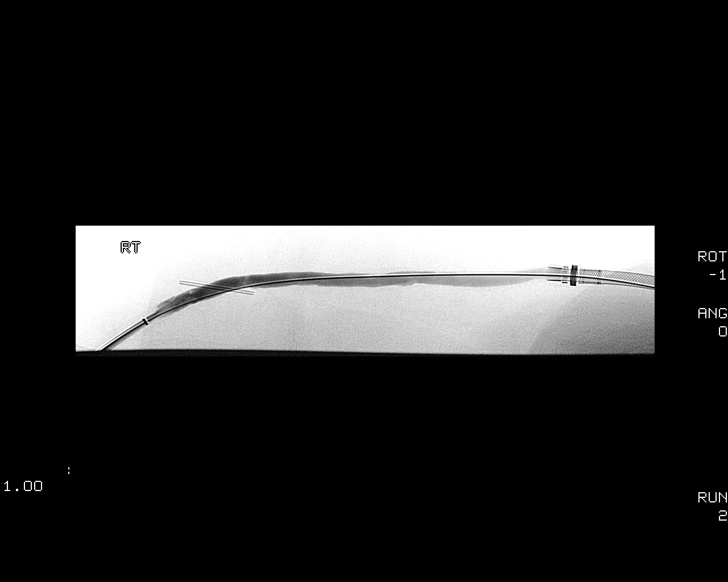
[im 23/35]
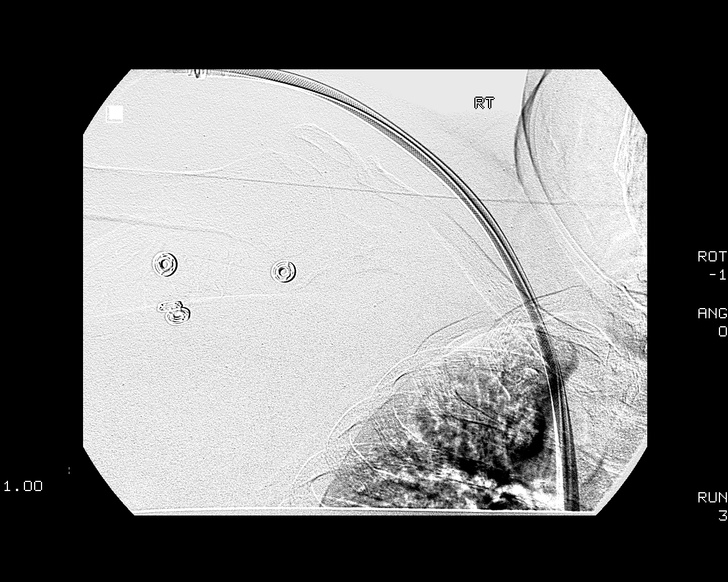
[im 26/35]
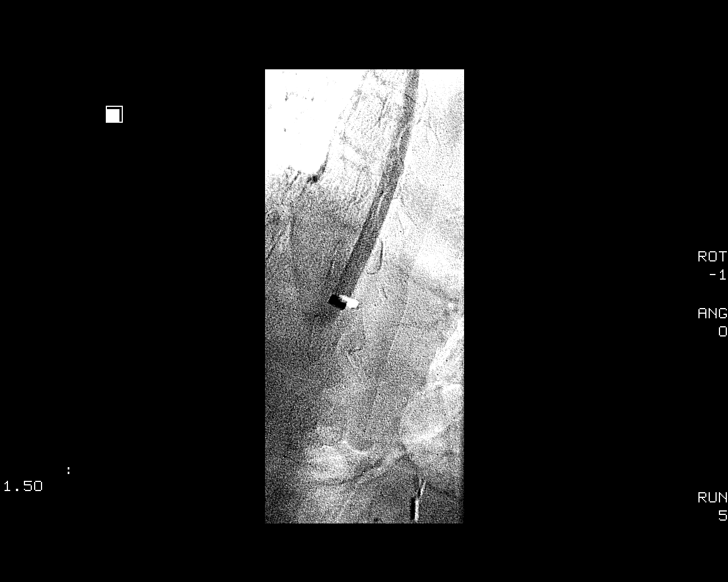
[im 29/35]
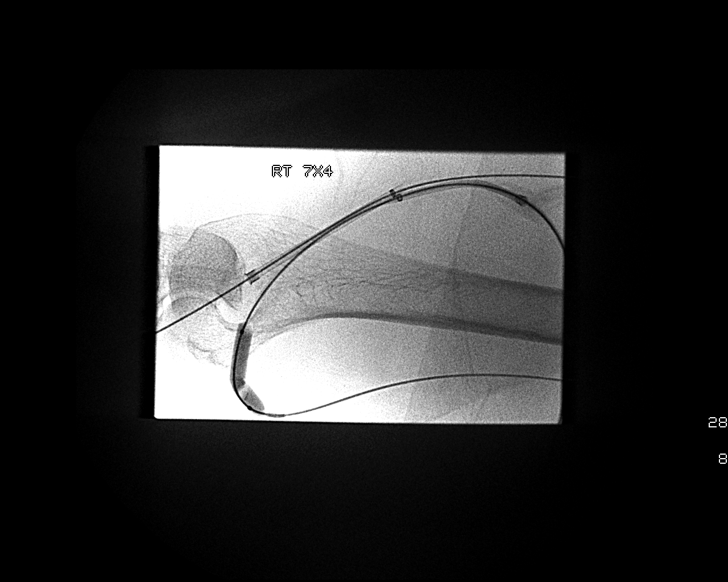
[im 32/35]
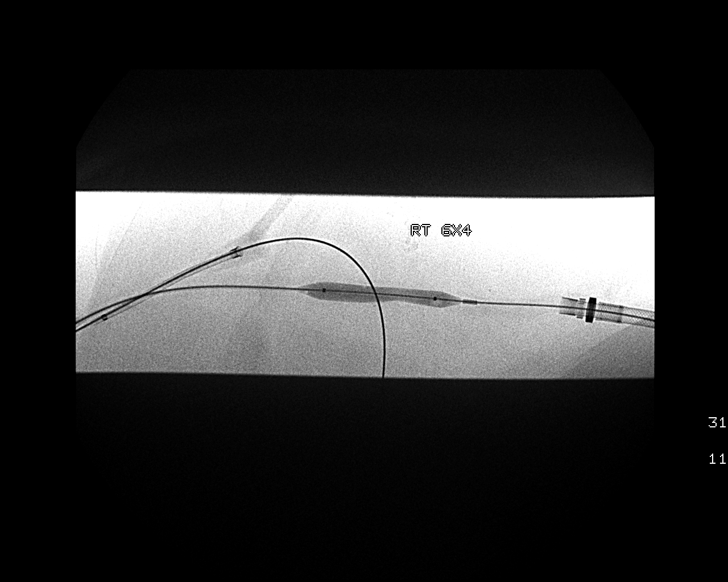
[im 35/35]
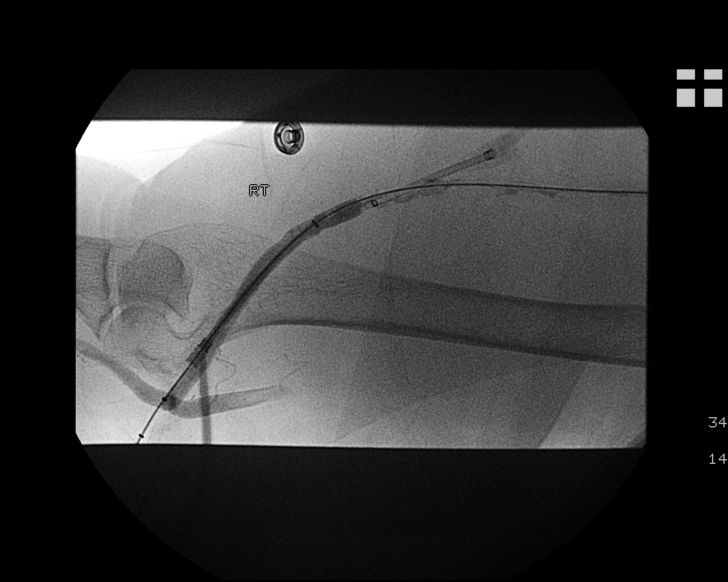

[12 of 24 positions shown; findings below may reference images not displayed]

1.  ULTRASOUND GUIDANCE FOR VASCULAR ACCESS OF DIALYSIS GRAFT.
2.  DIALYSIS GRAFT DECLOT PROCEDURE WITH TWO SEPARATE GRAFT ACCESS
SITES.
3.  VENOUS ANGIOPLASTY OF HERO GRAFT INCLUDING ARTERIAL ANASTOMOSIS

Sedation:  1.0 mg IV Versed; 25 mcg IV Fentanyl.

Total Moderate Sedation Time: 74 minutes.

Contrast:  80 ml Fmnipaque-GRR

Additional Medications:  2 mg tPA, 2777 U IV heparin

Fluoroscopy Time: 9.9 minutes.

Procedure:  The procedure, risks, benefits, and alternatives were
explained to the patient.  Questions regarding the procedure were
encouraged and answered.  The patient understands and consents to
the procedure.

The right arm Claudio Anibal dialysis graft was prepped with Betadine in a
sterile fashion, and a sterile drape was applied covering the
operative field.  A sterile gown and sterile gloves were used for
the procedure. Local anesthesia was provided with 1% Lidocaine.

Preliminary ultrasound was performed of the dialysis graft.  Both
antegrade and retrograde graft access was performed with
micropuncture sets under direct ultrasound guidance.  Ultrasound
image documentation was performed.  t-PA was instilled via each
access.  7-French antegrade and 6-French retrograde sheaths were
placed.  A diagnostic catheter was advanced and contrast injection
performed at the level of the venous anastomosis.  Outflow
venography was also performed via the catheter.

Balloon angioplasty was performed at the level of the HeRO graft
with a 7 mm x 4 cm Conquest balloon.

Mechanical thrombectomy was performed within the dialysis graft
with the AngioJet device.  Thrombectomy across the arterial
anastomosis was then performed with a 4-French Fogarty balloon
catheter.  Several passes were made with the Fogarty catheter.
Suction thrombectomy was then performed through the antegrade
sheath.

Graft patency was reassessed with angiography.  Additional
angioplasty was also performed across the arterial anastomosis of
the graft with a 6 mm x 4 cm Dorado balloon.

Upon completion of the procedure, both sheaths were removed and
hemostasis obtained with application of 2-0 Ethilon pursestring
sutures.

Complications:  None
FINDINGS: Ultrasound confirms thrombosis of the graft.  The tip of
the HeRO catheter extends just beyond the floor the right atrium
into the superior aspect of the inferior vena cava.  It was very
difficult to reestablish flow in the graft which was due to a
component of arterial anastomotic stenosis as well as apparent
deterioration of the graft material.  After thrombectomy and
balloon angioplasty, graft patency did improve significantly.  Mild
irregularity of graft material remains present.  Catheter portion
of the graft showed normal patency after completion.
IMPRESSION: Successful declot procedure to reestablish flow in an occluded Claudio Anibal
dialysis graft.  There is evidence of graft deterioration.
Component of arterial anastomotic stenosis also was present
requiring 6 mm angioplasty.  Should the graft occlude in a short
interval, consider surgical consultation to determine if graft
revision is possible.

Access Management:  The graft remains amenable to percutaneous
intervention.

## 2012-04-15 ENCOUNTER — Ambulatory Visit: Payer: Self-pay | Admitting: Internal Medicine

## 2012-04-25 ENCOUNTER — Emergency Department: Payer: Self-pay | Admitting: Internal Medicine

## 2012-04-25 LAB — COMPREHENSIVE METABOLIC PANEL
Albumin: 4 g/dL (ref 3.4–5.0)
Alkaline Phosphatase: 283 U/L — ABNORMAL HIGH (ref 50–136)
BUN: 67 mg/dL — ABNORMAL HIGH (ref 7–18)
Bilirubin,Total: 0.5 mg/dL (ref 0.2–1.0)
Co2: 29 mmol/L (ref 21–32)
Creatinine: 5.23 mg/dL — ABNORMAL HIGH (ref 0.60–1.30)
EGFR (Non-African Amer.): 9 — ABNORMAL LOW
Glucose: 96 mg/dL (ref 65–99)
SGOT(AST): 23 U/L (ref 15–37)
SGPT (ALT): 27 U/L (ref 12–78)
Sodium: 137 mmol/L (ref 136–145)

## 2012-04-25 LAB — CBC
HCT: 35.5 % (ref 35.0–47.0)
HGB: 12.1 g/dL (ref 12.0–16.0)
MCHC: 34 g/dL (ref 32.0–36.0)
MCV: 103 fL — ABNORMAL HIGH (ref 80–100)
WBC: 4.5 10*3/uL (ref 3.6–11.0)

## 2012-04-25 LAB — TROPONIN I: Troponin-I: 0.03 ng/mL

## 2012-04-25 LAB — PROTIME-INR
INR: 0.9
Prothrombin Time: 13 secs (ref 11.5–14.7)

## 2012-04-30 ENCOUNTER — Ambulatory Visit: Payer: Medicare Other | Admitting: Internal Medicine

## 2012-05-24 ENCOUNTER — Inpatient Hospital Stay: Payer: Self-pay | Admitting: Internal Medicine

## 2012-05-24 LAB — COMPREHENSIVE METABOLIC PANEL
Anion Gap: 10 (ref 7–16)
BUN: 24 mg/dL — ABNORMAL HIGH (ref 7–18)
Calcium, Total: 9.1 mg/dL (ref 8.5–10.1)
Chloride: 93 mmol/L — ABNORMAL LOW (ref 98–107)
Co2: 26 mmol/L (ref 21–32)
EGFR (Non-African Amer.): 10 — ABNORMAL LOW
Osmolality: 263 (ref 275–301)
Potassium: 4.4 mmol/L (ref 3.5–5.1)
SGPT (ALT): 13 U/L (ref 12–78)
Sodium: 129 mmol/L — ABNORMAL LOW (ref 136–145)

## 2012-05-24 LAB — TROPONIN I
Troponin-I: <0.02 ng/mL
Troponin-I: <0.02 ng/mL
Troponin-I: <0.02 ng/mL

## 2012-05-24 LAB — CK TOTAL AND CKMB (NOT AT ARMC)
CK, Total: 31 U/L (ref 21–215)
CK-MB: 2.8 ng/mL (ref 0.5–3.6)

## 2012-05-24 LAB — PROTIME-INR: INR: 1.2

## 2012-05-24 LAB — CBC
HCT: 27 % — ABNORMAL LOW (ref 35.0–47.0)
HGB: 8.8 g/dL — ABNORMAL LOW (ref 12.0–16.0)
MCH: 33.1 pg (ref 26.0–34.0)
MCV: 102 fL — ABNORMAL HIGH (ref 80–100)
RBC: 2.65 10*6/uL — ABNORMAL LOW (ref 3.80–5.20)
WBC: 6.5 10*3/uL (ref 3.6–11.0)

## 2012-05-24 LAB — PRO B NATRIURETIC PEPTIDE: B-Type Natriuretic Peptide: 17548 pg/mL — ABNORMAL HIGH (ref 0–125)

## 2012-05-24 LAB — PHOSPHORUS: Phosphorus: 2.9 mg/dL (ref 2.5–4.9)

## 2012-05-25 LAB — CBC WITH DIFFERENTIAL/PLATELET
Basophil #: 0 10*3/uL (ref 0.0–0.1)
Basophil %: 1 %
Eosinophil %: 16.4 %
HGB: 7.6 g/dL — ABNORMAL LOW (ref 12.0–16.0)
Lymphocyte #: 0.5 10*3/uL — ABNORMAL LOW (ref 1.0–3.6)
Lymphocyte %: 12 %
MCHC: 31.2 g/dL — ABNORMAL LOW (ref 32.0–36.0)
Monocyte %: 5.1 %
Neutrophil #: 2.5 10*3/uL (ref 1.4–6.5)
RBC: 2.37 10*6/uL — ABNORMAL LOW (ref 3.80–5.20)

## 2012-05-25 LAB — BASIC METABOLIC PANEL
Calcium, Total: 8.8 mg/dL (ref 8.5–10.1)
Chloride: 97 mmol/L — ABNORMAL LOW (ref 98–107)
Co2: 32 mmol/L (ref 21–32)
Osmolality: 271 (ref 275–301)
Potassium: 4 mmol/L (ref 3.5–5.1)
Sodium: 135 mmol/L — ABNORMAL LOW (ref 136–145)

## 2012-05-25 LAB — IRON AND TIBC
Iron Saturation: 26 %
Iron: 37 ug/dL — ABNORMAL LOW (ref 50–170)
Unbound Iron-Bind.Cap.: 103 ug/dL

## 2012-05-25 LAB — FOLATE: Folic Acid: 8.2 ng/mL (ref 3.1–100.0)

## 2012-05-26 LAB — RENAL FUNCTION PANEL
Anion Gap: 6 — ABNORMAL LOW (ref 7–16)
BUN: 17 mg/dL (ref 7–18)
Chloride: 96 mmol/L — ABNORMAL LOW (ref 98–107)
Co2: 32 mmol/L (ref 21–32)
Creatinine: 3.36 mg/dL — ABNORMAL HIGH (ref 0.60–1.30)
EGFR (African American): 17 — ABNORMAL LOW
EGFR (Non-African Amer.): 15 — ABNORMAL LOW
Glucose: 126 mg/dL — ABNORMAL HIGH (ref 65–99)
Phosphorus: 2 mg/dL — ABNORMAL LOW (ref 2.5–4.9)
Potassium: 3.9 mmol/L (ref 3.5–5.1)
Sodium: 134 mmol/L — ABNORMAL LOW (ref 136–145)

## 2012-05-26 LAB — CBC WITH DIFFERENTIAL/PLATELET
Basophil #: 0.1 10*3/uL (ref 0.0–0.1)
Basophil %: 1.3 %
Eosinophil #: 0.5 10*3/uL (ref 0.0–0.7)
HCT: 27.5 % — ABNORMAL LOW (ref 35.0–47.0)
HGB: 8.9 g/dL — ABNORMAL LOW (ref 12.0–16.0)
Lymphocyte #: 0.5 10*3/uL — ABNORMAL LOW (ref 1.0–3.6)
MCHC: 32.3 g/dL (ref 32.0–36.0)
MCV: 102 fL — ABNORMAL HIGH (ref 80–100)
Monocyte %: 5.4 %
Neutrophil #: 2.9 10*3/uL (ref 1.4–6.5)
Platelet: 232 10*3/uL (ref 150–440)
RDW: 14.6 % — ABNORMAL HIGH (ref 11.5–14.5)
WBC: 4.2 10*3/uL (ref 3.6–11.0)

## 2012-06-01 IMAGING — XA CT PELV - CT LOW EXTREM*L* W/ CM
1 series · 13 of 22 positions shown · non-contrast
Comparison: none

CLINICAL HISTORY: 54-year-old with prolonged bleeding and pulling
clots fromHeRo graft

[Series 1: run · 13 of 22 slices shown]
[im 1/22]
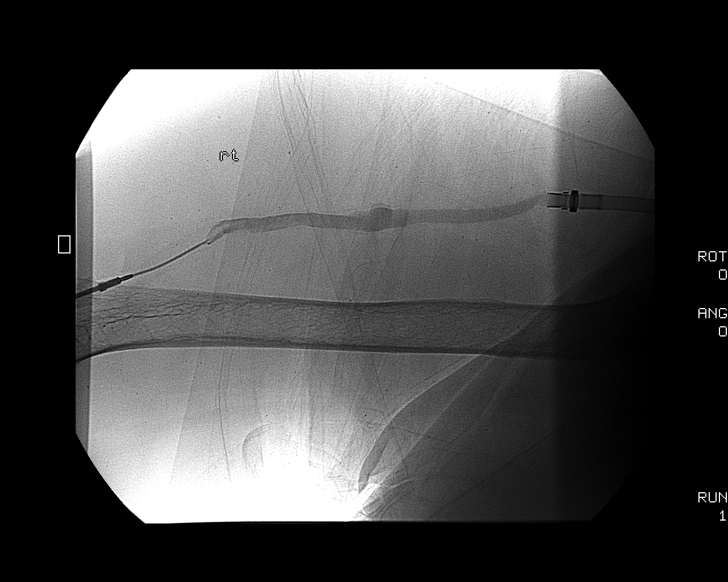
[im 3/22]
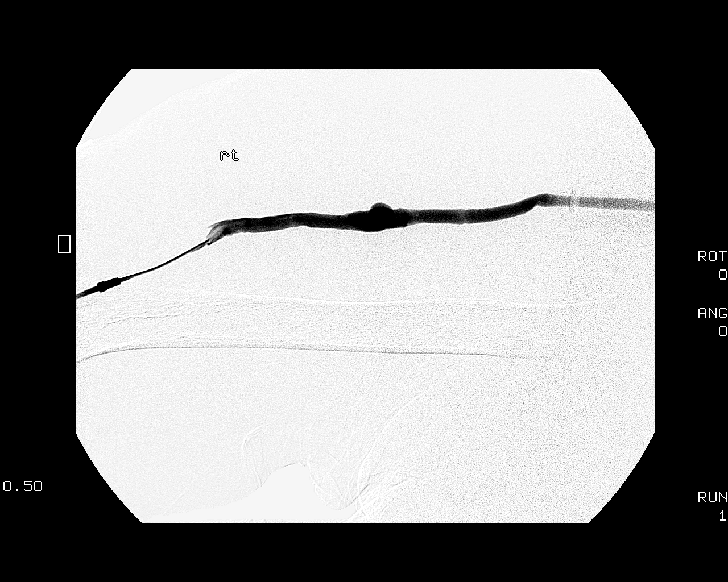
[im 5/22]
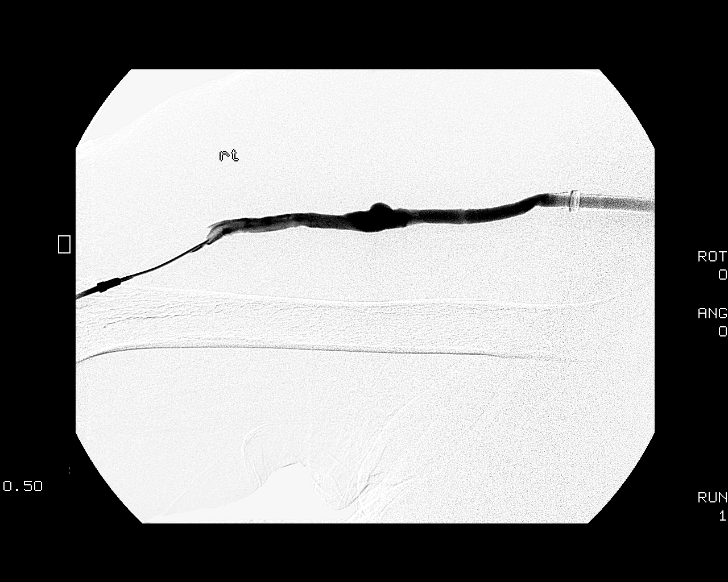
[im 6/22]
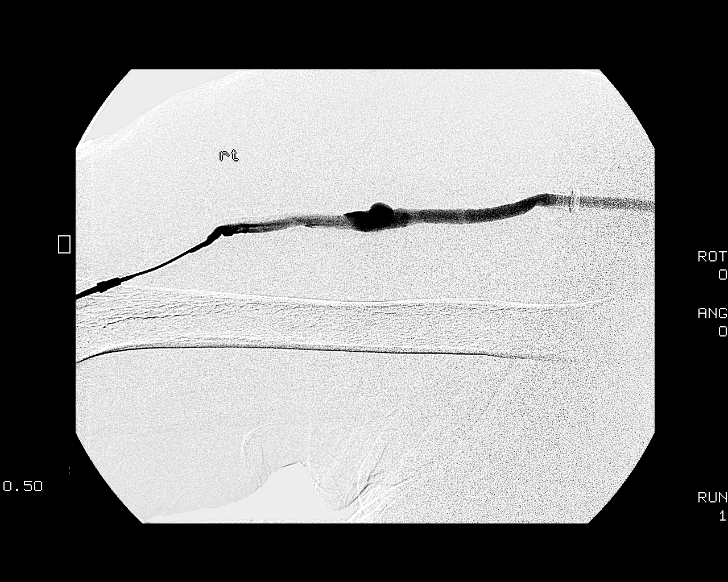
[im 8/22]
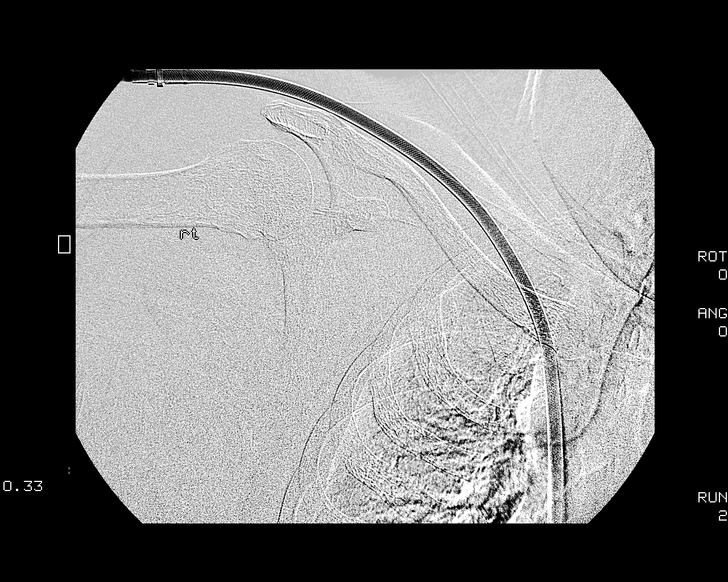
[im 10/22]
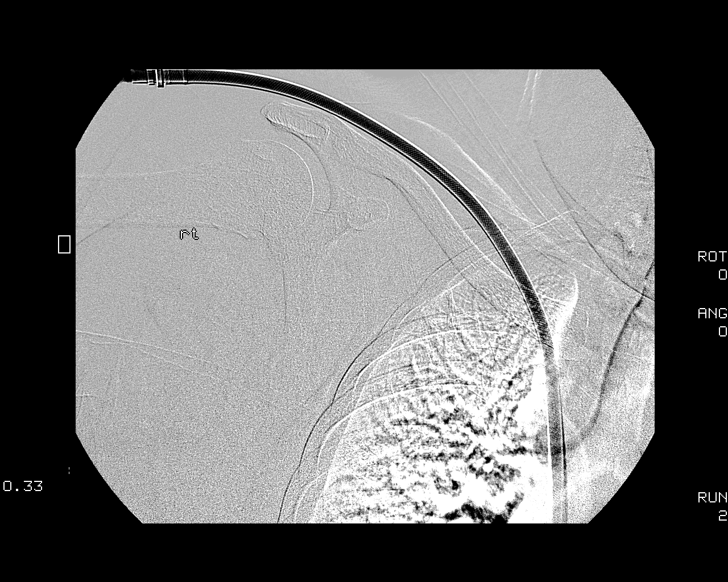
[im 12/22]
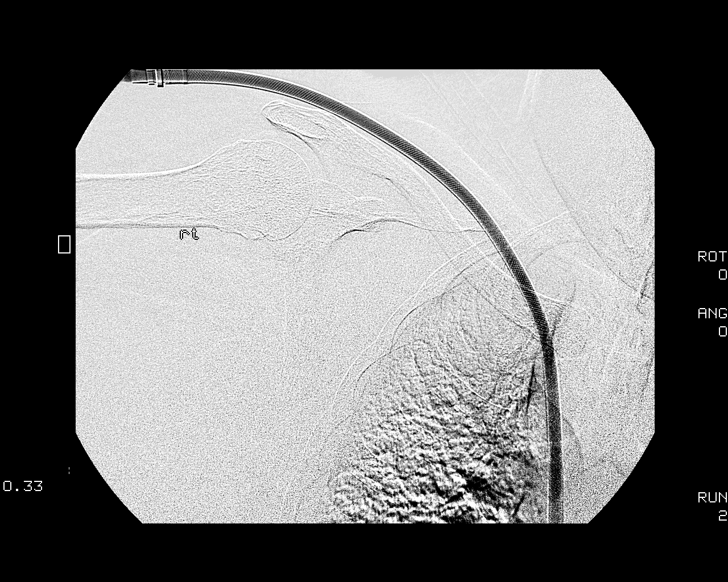
[im 13/22]
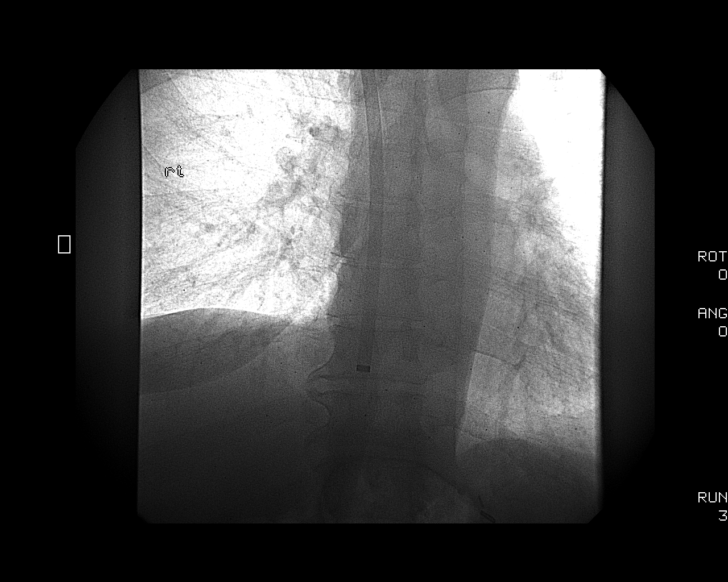
[im 15/22]
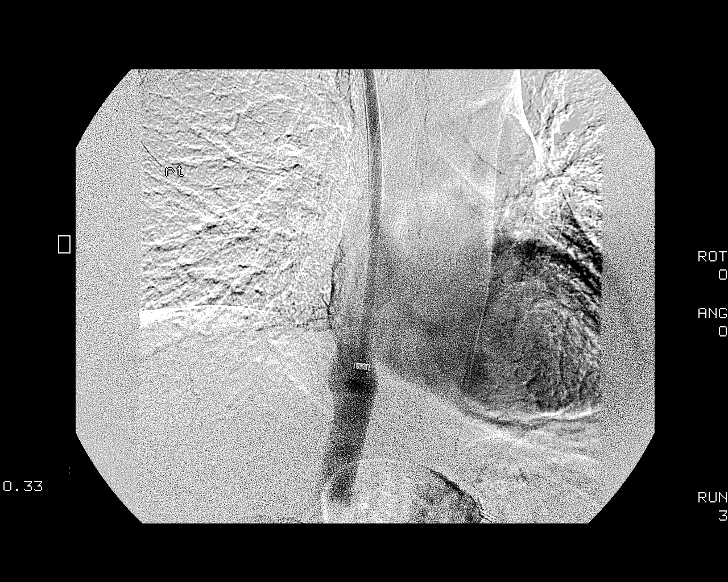
[im 17/22]
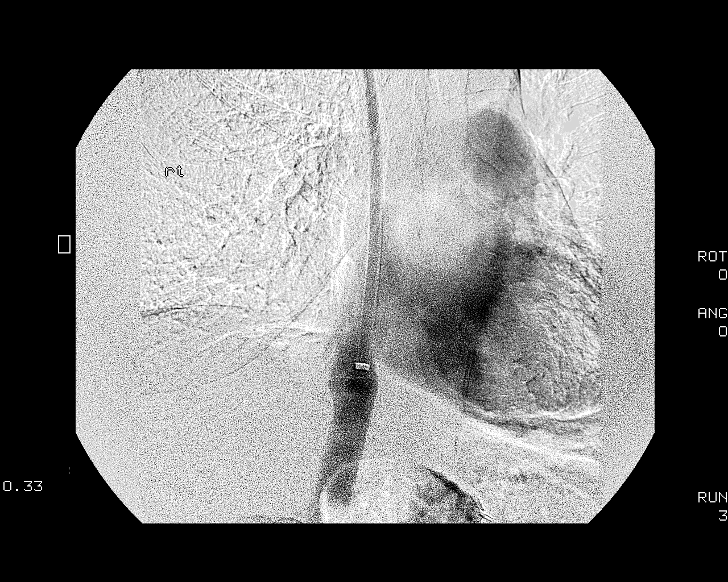
[im 18/22]
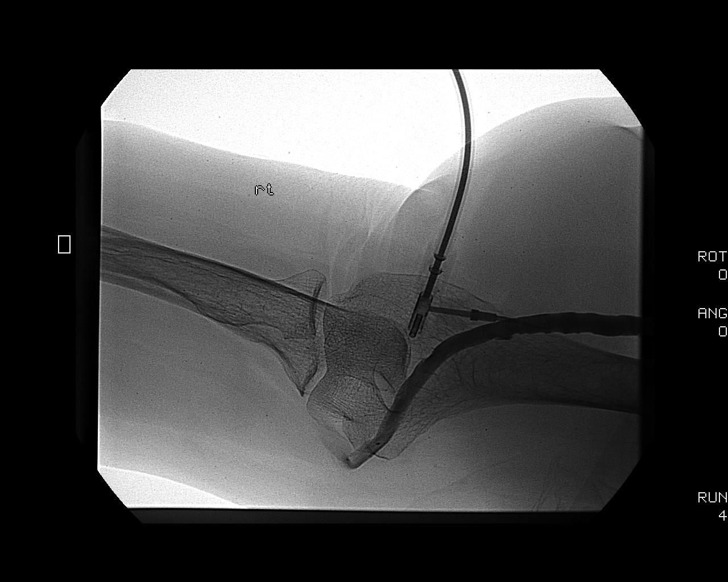
[im 20/22]
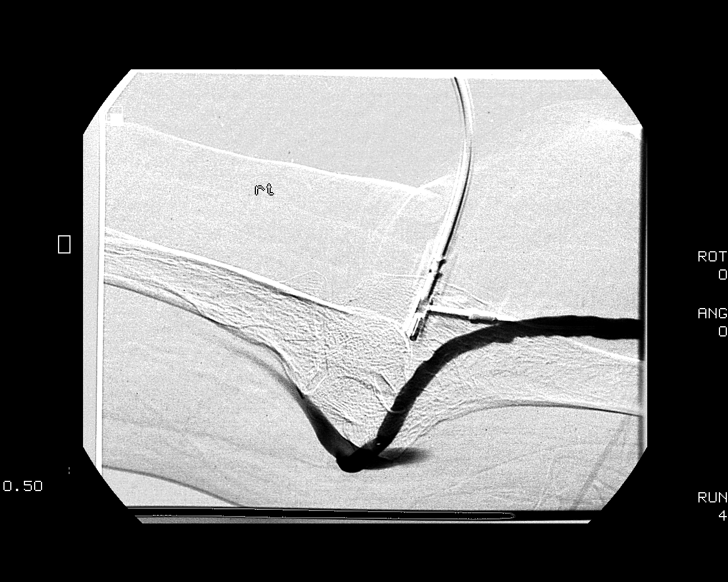
[im 22/22]
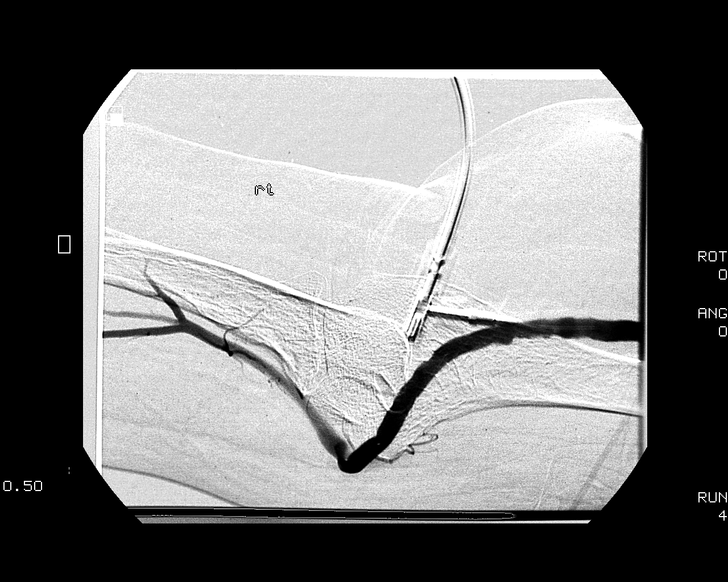

[13 of 22 positions shown; findings below may reference images not displayed]

PROCEDURE(S): RIGHT UPPER EXTREMITY SHUNTOGRAM

Medications:None

Moderate sedation time:None

Fluoroscopy time: 0.6 minutes

Contrast:  32 ml Omnipaque 300

Procedure:The procedure was explained to the patient.  The risks
and benefits of the procedure were discussed and the patient's
questions were addressed.  Informed consent was obtained from the
patient.  The patient has an IV contrast allergy and she has was
premedicated for the procedure.  The right upper arm graft was
accessed with an angiocath.  A series of shuntogram images were
obtained.  Catheter was removed with manual compression.
FINDINGS: The right upper extremity HeRo graft is patent.  Tip of
the graft/catheter is at the junction of the right atrium and IVC.
No significant stenosis.

Complications: None
IMPRESSION: Patent right upper extremity HeRo graft.  No areas of
critical stenosis.

## 2012-06-02 ENCOUNTER — Ambulatory Visit: Payer: Medicare Other | Admitting: Internal Medicine

## 2012-06-13 ENCOUNTER — Other Ambulatory Visit: Payer: Self-pay | Admitting: Geriatric Medicine

## 2012-06-13 ENCOUNTER — Inpatient Hospital Stay: Payer: Self-pay | Admitting: Internal Medicine

## 2012-06-13 LAB — CBC
HCT: 24.2 % — ABNORMAL LOW (ref 35.0–47.0)
HGB: 7.7 g/dL — ABNORMAL LOW (ref 12.0–16.0)
MCH: 32.1 pg (ref 26.0–34.0)
MCV: 100 fL (ref 80–100)
RBC: 2.41 10*6/uL — ABNORMAL LOW (ref 3.80–5.20)
RDW: 15.4 % — ABNORMAL HIGH (ref 11.5–14.5)
WBC: 5.4 10*3/uL (ref 3.6–11.0)

## 2012-06-13 LAB — TROPONIN I: Troponin-I: <0.02 ng/mL

## 2012-06-13 LAB — COMPREHENSIVE METABOLIC PANEL
Albumin: 3.1 g/dL — ABNORMAL LOW (ref 3.4–5.0)
Alkaline Phosphatase: 298 U/L — ABNORMAL HIGH (ref 50–136)
Anion Gap: 7 (ref 7–16)
BUN: 33 mg/dL — ABNORMAL HIGH (ref 7–18)
Calcium, Total: 9 mg/dL (ref 8.5–10.1)
Chloride: 97 mmol/L — ABNORMAL LOW (ref 98–107)
Creatinine: 4.55 mg/dL — ABNORMAL HIGH (ref 0.60–1.30)
EGFR (Non-African Amer.): 10 — ABNORMAL LOW
Osmolality: 276 (ref 275–301)
SGOT(AST): 19 U/L (ref 15–37)
Sodium: 135 mmol/L — ABNORMAL LOW (ref 136–145)

## 2012-06-13 LAB — HEMOGLOBIN: HGB: 8.6 g/dL — ABNORMAL LOW (ref 12.0–16.0)

## 2012-06-13 LAB — CK TOTAL AND CKMB (NOT AT ARMC): CK, Total: 25 U/L (ref 21–215)

## 2012-06-14 LAB — PHOSPHORUS: Phosphorus: 3.9 mg/dL (ref 2.5–4.9)

## 2012-06-14 LAB — CK TOTAL AND CKMB (NOT AT ARMC)
CK-MB: 0.6 ng/mL (ref 0.5–3.6)
CK-MB: 0.8 ng/mL (ref 0.5–3.6)

## 2012-06-15 LAB — RETICULOCYTES: Reticulocyte: 1.68 % (ref 0.5–2.2)

## 2012-06-15 LAB — IRON AND TIBC
Iron Bind.Cap.(Total): 168 ug/dL — ABNORMAL LOW (ref 250–450)
Unbound Iron-Bind.Cap.: 135 ug/dL

## 2012-06-15 LAB — FOLATE: Folic Acid: 6.4 ng/mL (ref 3.1–100.0)

## 2012-06-16 ENCOUNTER — Other Ambulatory Visit: Payer: Self-pay

## 2012-06-16 LAB — TROPONIN I: Troponin-I: <0.02 ng/mL

## 2012-06-19 LAB — CULTURE, BLOOD (SINGLE)

## 2012-06-21 IMAGING — CR DG CHEST 1V PORT
1 series · 1 of 1 positions shown · non-contrast
Comparison: 03/19/2010

CLINICAL DATA: Weakness.  Chest pain.  Shortness of breath.

PORTABLE CHEST - 1 VIEW

[view not recorded]
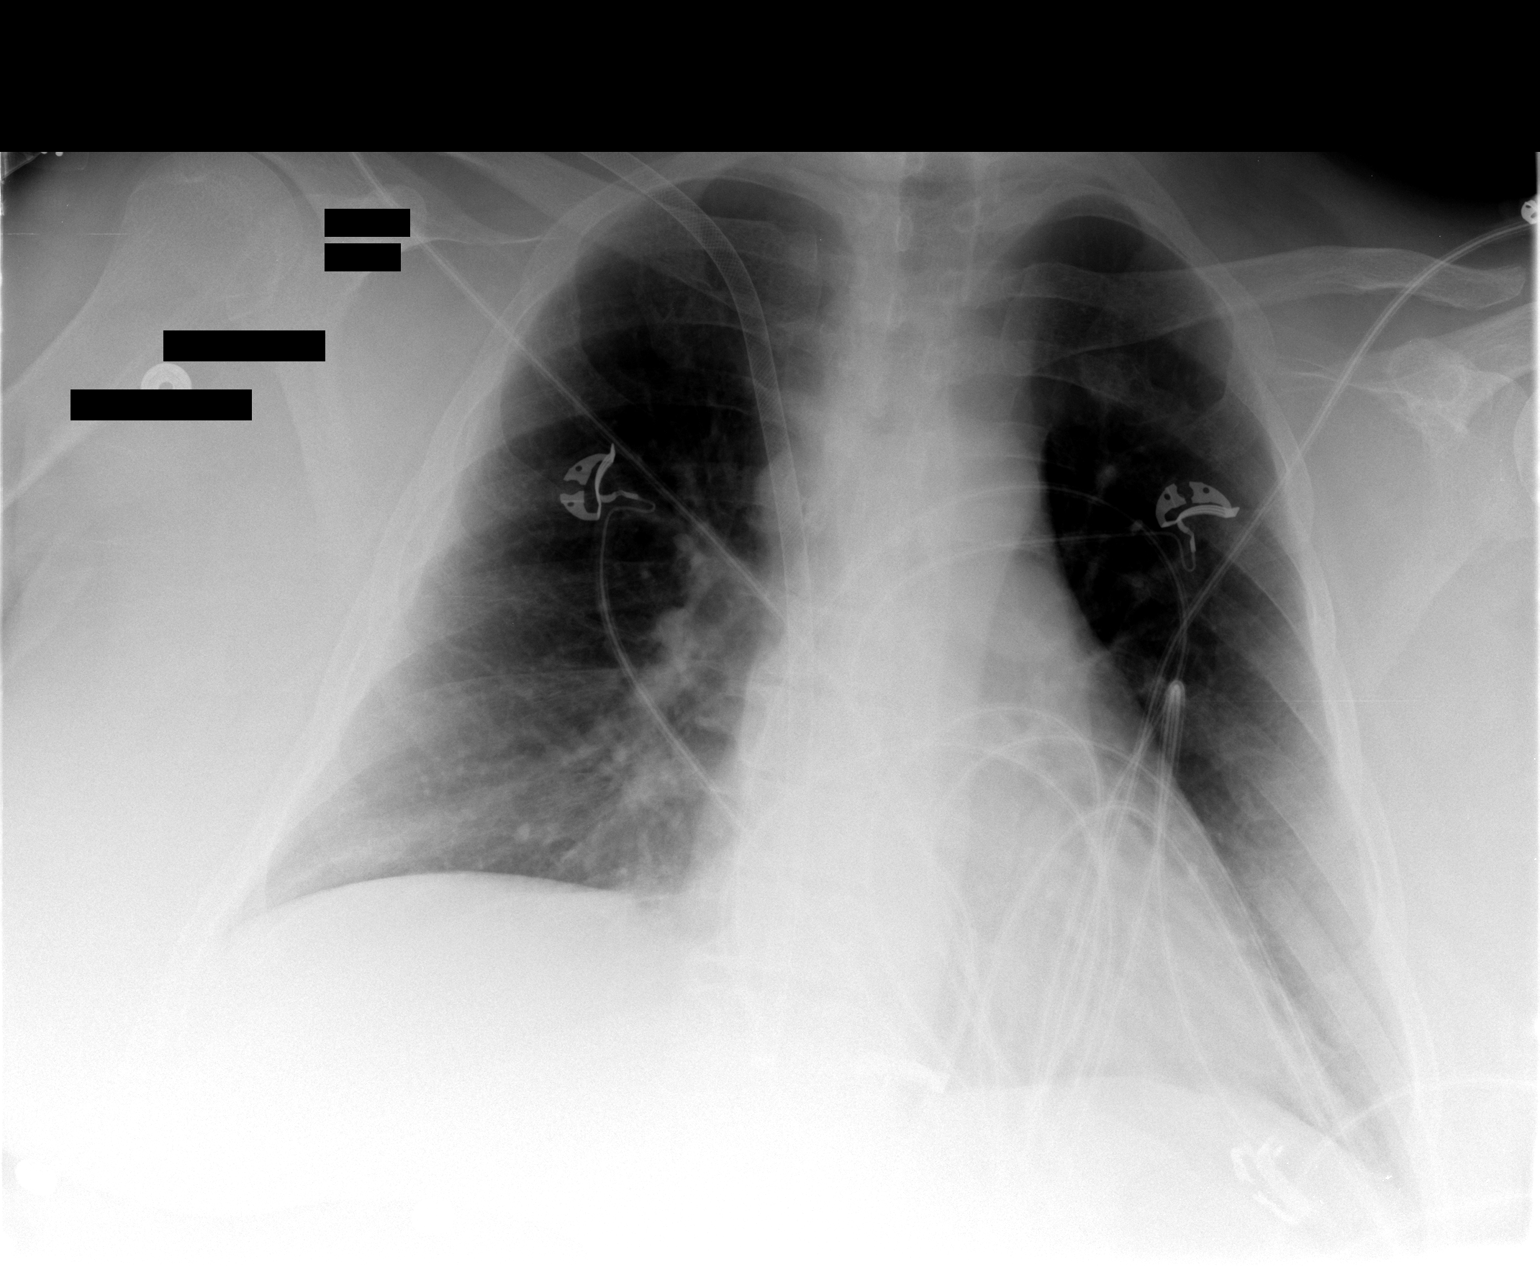

[1 of 1 positions shown; findings below may reference images not displayed]

FINDINGS: Right Traupe dialysis graft noted in the upper IVC near its
junction with the right atrium.

No cardiomegaly noted.  The lungs appear clear.  No pleural
effusion is identified.
IMPRESSION: Traupe dialysis graft position is stable, in the upper IVC region.

## 2012-06-26 IMAGING — CR DG CHEST 2V
2 series · 2 of 2 positions shown · non-contrast
Comparison: 06/21/2010

CLINICAL DATA: Chest pain, body aches and pains.

CHEST - 2 VIEW

[w chest pa]
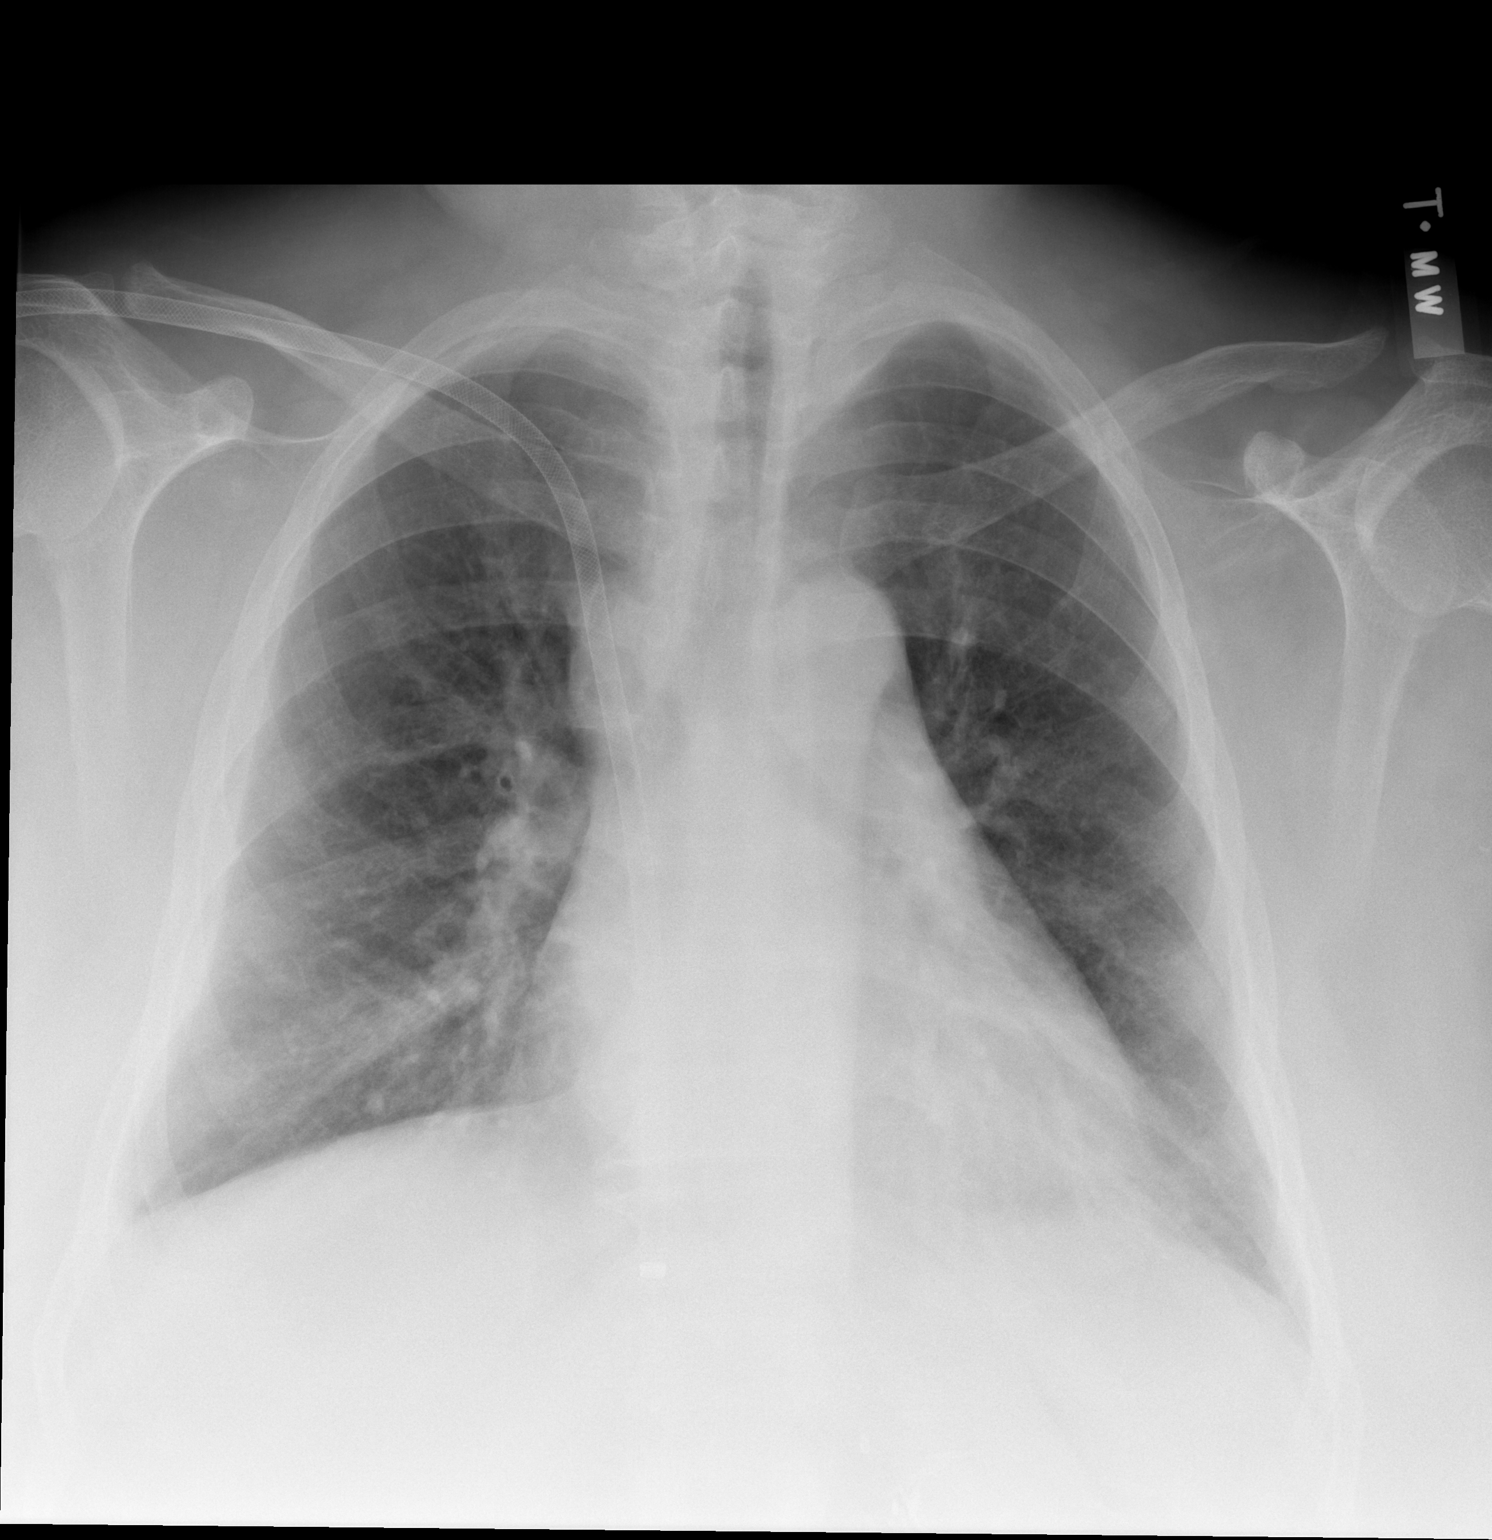

[w chest lat]
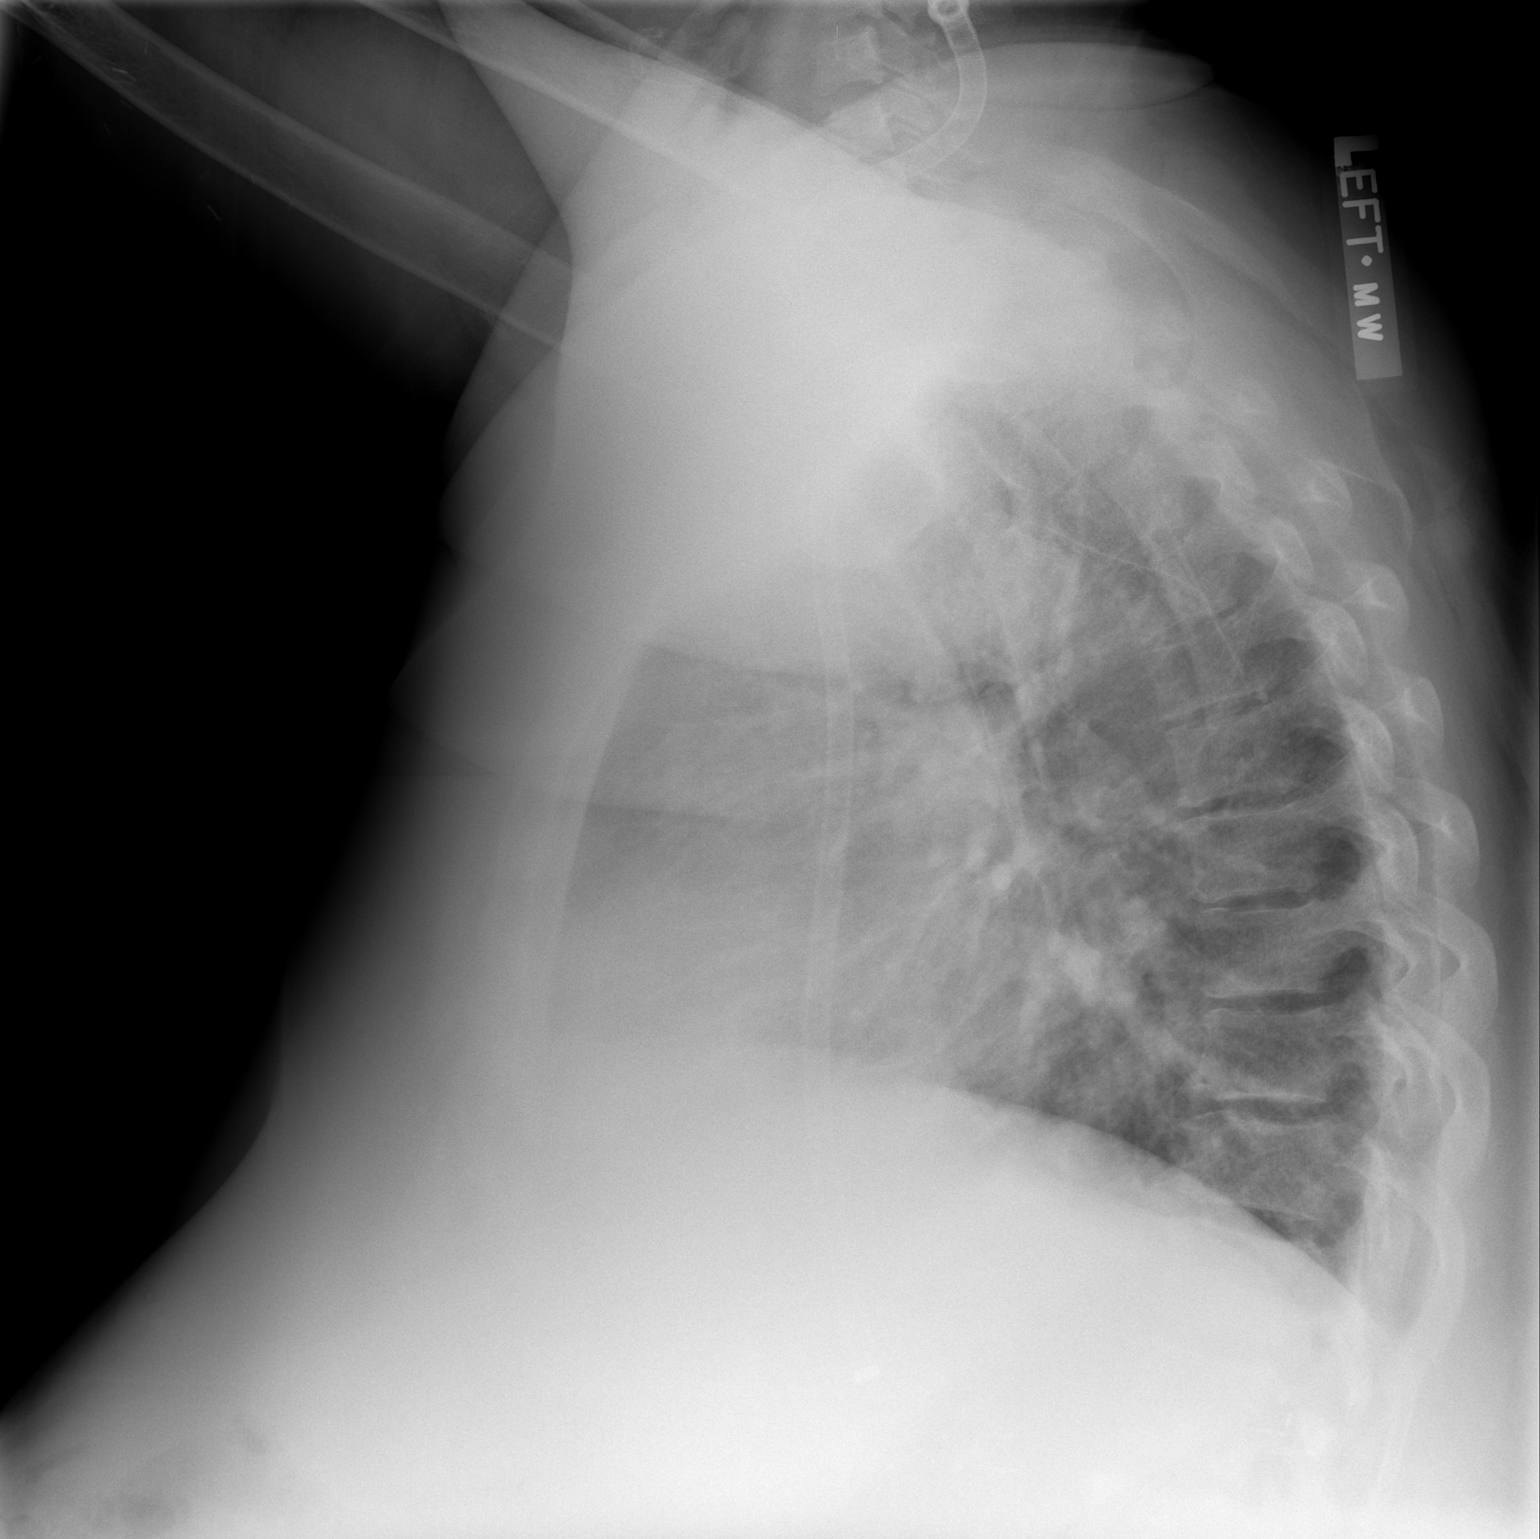

[2 of 2 positions shown; findings below may reference images not displayed]

FINDINGS: Trachea is midline.  Heart size stable.  Right-sided
dialysis graft tip projects in the region of the IVC-RA junction.
Mild biapical pleural thickening.  Mild interstitial  prominence
and indistinctness, with a basilar predominance.  No definite
pleural fluid.
IMPRESSION: Probable mild interstitial pulmonary edema.  A viral process could
also have this appearance.

## 2012-07-21 ENCOUNTER — Ambulatory Visit: Payer: Self-pay | Admitting: Vascular Surgery

## 2012-08-17 IMAGING — CT CT CERVICAL SPINE W/O CM
4 of 8 series · 9 of 33 positions shown, 10 images · non-contrast
Comparison: None.

CT HEAD

CLINICAL DATA: Syncope.  Fall.  Seizure disorder.  Head trauma.
Left facial laceration and pain.  Neck pain.  Morbid obesity.

CT HEAD WITHOUT CONTRAST
CT MAXILLOFACIAL WITHOUT CONTRAST
CT CERVICAL SPINE WITHOUT CONTRAST
TECHNIQUE: Multidetector CT imaging of the head, cervical spine,
and maxillofacial structures were performed using the standard
protocol without intravenous contrast. Multiplanar CT image
reconstructions of the cervical spine and maxillofacial structures
were also generated.

[Series 8: facial bones · axial · 0.40mm/px · z∈[+214,+274]mm · 2 of 91 slices shown, 3 images]
[im 31/91  soft-tissue]
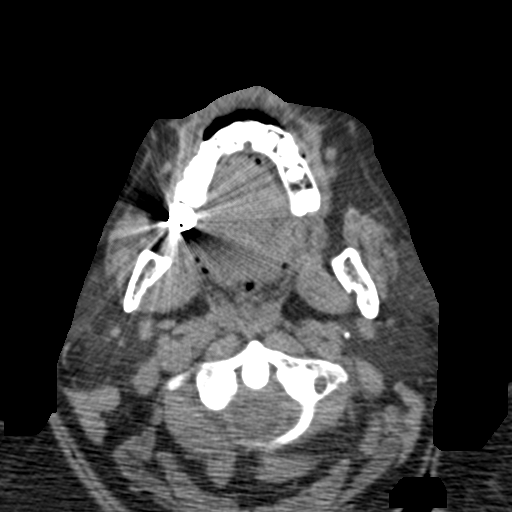
[im 31/91  bone]
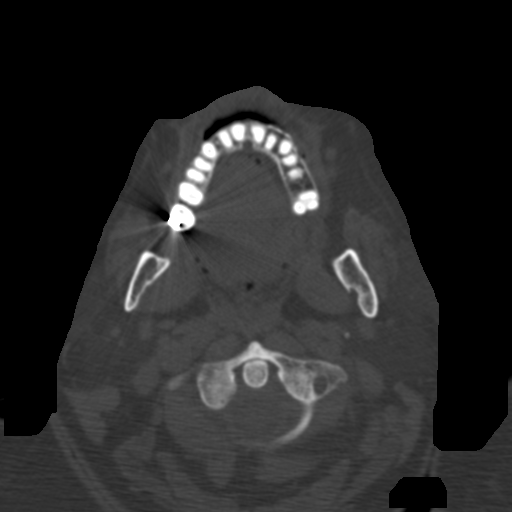
[im 61/91  bone]
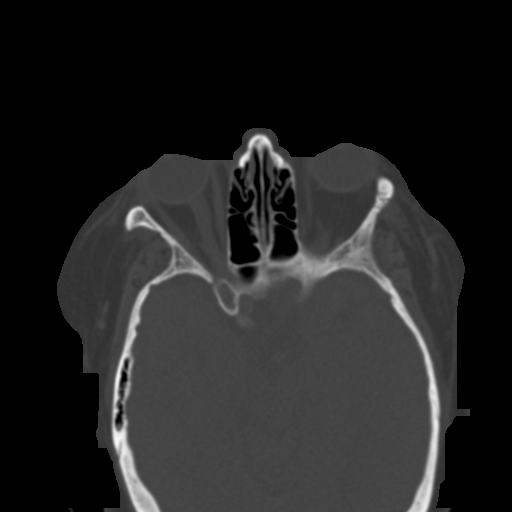

[Series 16: soft tissue · axial · 0.41mm/px · z∈[+99,+223]mm · 3 of 126 slices shown]
[im 32/126  soft-tissue]
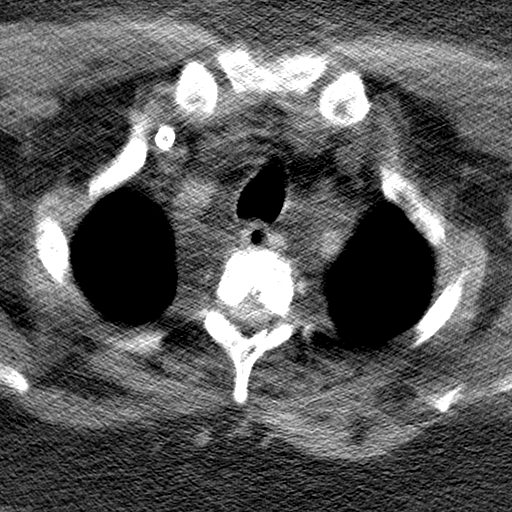
[im 63/126  soft-tissue]
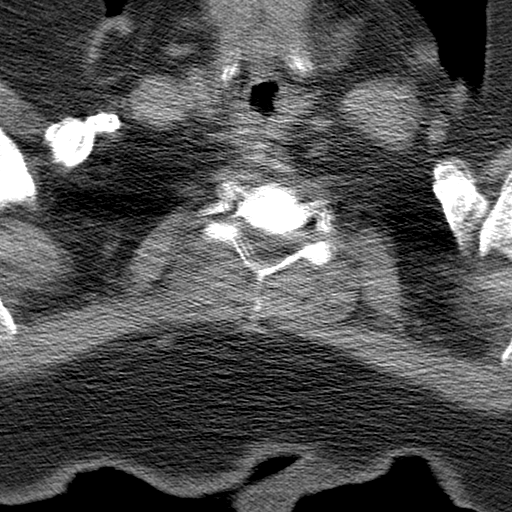
[im 94/126  soft-tissue]
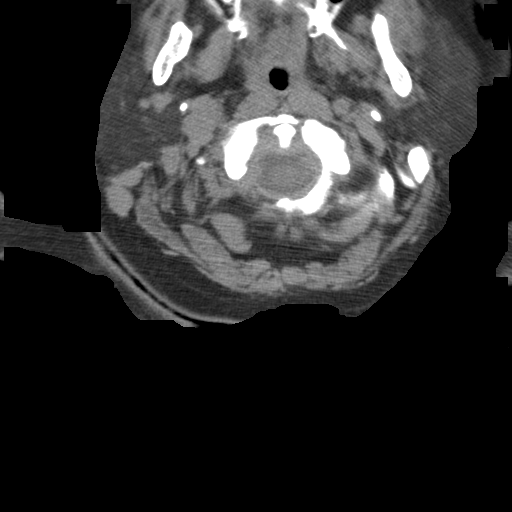

[mpr, sagittal, sagittal · sagittal · 0.49mm/px · 3 of 41 slices shown]
[im 11/41  bone]
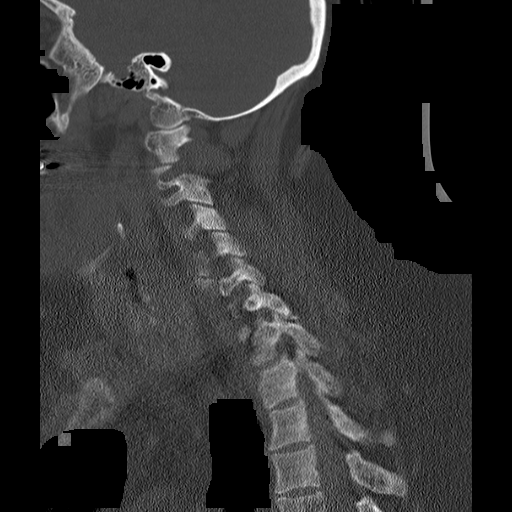
[im 21/41  bone]
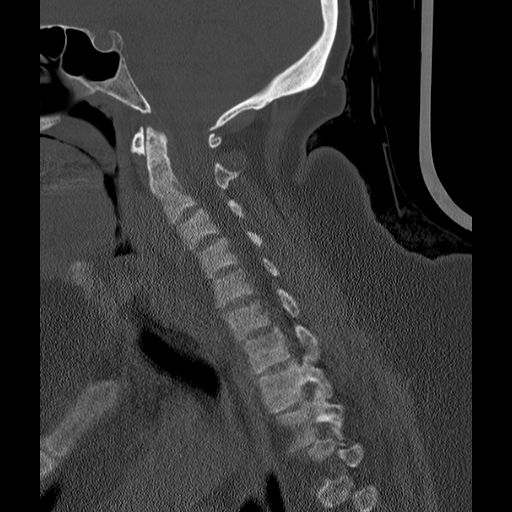
[im 31/41  bone]
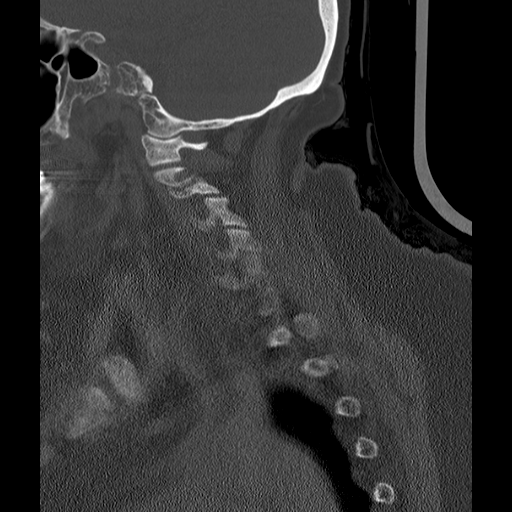

[mpr, coronal, coronal · coronal · 0.49mm/px · 1 of 49 slices shown]
[im 25/49  bone]
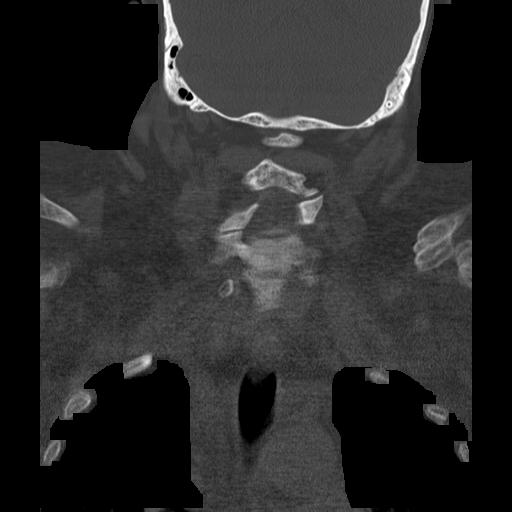

[9 of 33 positions shown; findings below may reference images not displayed]

FINDINGS: There is no evidence of intracranial hemorrhage, brain
edema or other signs of acute infarction.  There is no evidence of
intracranial mass lesion or mass effect.  No abnormal extra-axial
fluid collections are identified.

Ventricles are normal in size.  Calcified subependymal tubers are
seen along the walls of both lateral ventricles, consistent with
tuberous sclerosis.  No evidence of skull fracture.
IMPRESSION: 1.  No acute intracranial abnormality.
2.  Findings consistent with tuberous sclerosis.

CT MAXILLOFACIAL
FINDINGS: No evidence of orbital or facial bone fracture.  Globes
and intraorbital anatomy are normal appearance.  No evidence of
orbital emphysema or sinus air fluid levels.
IMPRESSION: Negative.  No evidence of orbital or facial bone fracture.

CT CERVICAL SPINE
FINDINGS: The exam was technically suboptimal due to beam
hardening artifact secondary to large patient habitus.  No cervical
spine fracture is identified.  No evidence of subluxation.
Intervertebral disc spaces are maintained.  Congenital fusion is
seen at C2-3.  No other significant bone abnormality identified.
IMPRESSION: 1.  No evidence of circle spine fracture or subluxation.
2.  Congenital fusion at C2-3.

## 2012-08-21 IMAGING — US IR US GUIDE VASC ACCESS RIGHT
1 series · 2 of 2 positions shown · non-contrast
Comparison: none

CLINICAL HISTORY: End-stage renal disease and clotted right upper
extremity HeRo graft

[Series 1: ir us guide vasc access right · 2 of 2 slices shown]
[im 1/2]
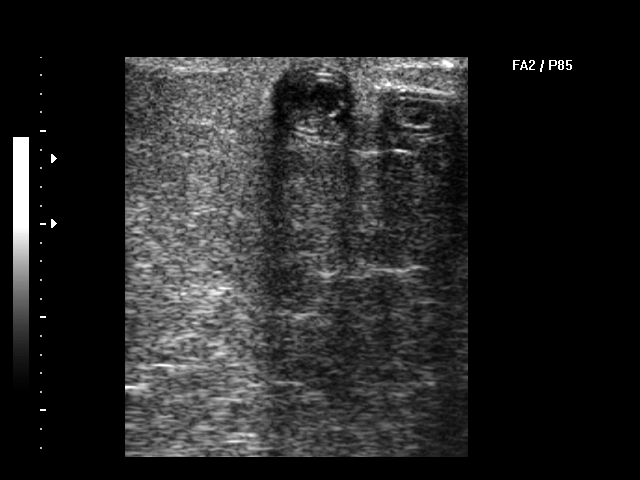
[im 2/2]
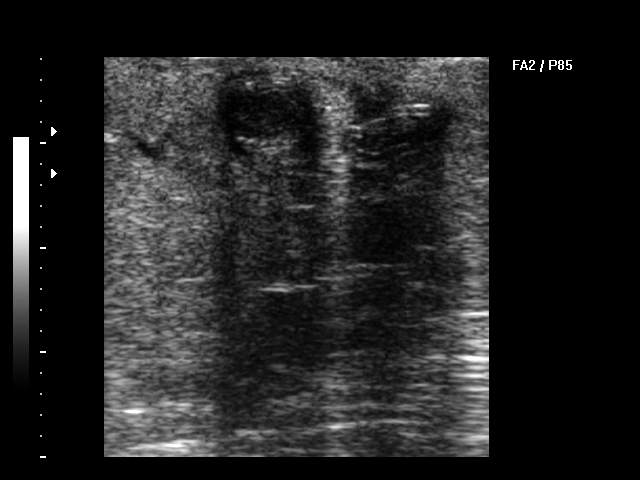

[2 of 2 positions shown; findings below may reference images not displayed]

PROCEDURE(S): DIALYSIS HERO GRAFT DECLOT; GRAFT/ARTERIAL PTA;
ULTRASOUND GUIDANCE FOR VASCULAR ACCESS

Medications:Heparin 1777 units, TPA 2 mg

Fluoroscopy time: 9.6 minutes

Contrast: 65 ml Omnipaque 300

Procedure:Informed consent was obtained for a declot procedure.
The patient understood the risks of pulmonary embolism.  The right
upper arm was prepped and draped in a sterile fashion.  Maximal
barrier sterile technique was utilized including caps, mask,
sterile gowns, sterile gloves, sterile drape, hand hygiene and skin
antiseptic.  The skin was anesthetized with 1% lidocaine.  The
graft was accessed using 21 gauge needles towards the heart and
arterial anastomoses with ultrasound guidance.  Ultrasound images
were obtained for documentation. Micropuncture catheters were
placed.  2 mg of TPA was infused through the micropuncture
catheters.  The vascular access pointing towards the central veins
was upsized to a 7-French vascular sheath.  A 5-French catheter was
advanced into the central venous structures and a central venogram
was performed.  Fluoroscopic images were saved for documentation.
A Bentson wire was placed centrally and the HeRO graft was treated
with the AngioJet thrombectomy device.  The access pointing towards
the arterial anastomosis was upsized to a 6-French sheath.  A wire
was advanced into the arterial system.  The arterial plug was
pulled using a 5.5 Fogarty balloon.  There was flow in the graft.
Follow-up venogram demonstrated residual clot within the graft.
The clot was successfully treated with a 6 mm x 40 mm Conquest
balloon.  Retrograde  venogram  demonstrated a critical stenosis at
the venous anastomosis.  This area was treated with a 5 mm x 20 mm
Mustang balloon.  Follow-up venogram performed.  The critical
stenosis at the arterial anastomosis was treated again with a 6 mm
x 20 mm Mustang balloon.  Follow-up venogram was performed.  The
vascular sheaths were removed with pursestring sutures.
FINDINGS: Critical stenosis at the arterial anastomosis was
successfully angioplastied with 5 mm and 6 mm balloons.  At the end
of the study, there was flow throughout the HeRO graft.  The tip of
the catheter is near the junction right atrium and IVC.
IMPRESSION: Successful declot of the right upper extremity HeRO
graft.

Access management:  The right upper arm graft is amendable to
future intervention.

## 2012-11-03 IMAGING — CT CT HEAD W/O CM
1 of 2 series · 16 of 30 positions shown, 20 images · non-contrast
Comparison: 08/17/2010

CLINICAL DATA: Weakness, seizures, mental retardation.

CT HEAD WITHOUT CONTRAST
TECHNIQUE: Contiguous axial images were obtained from the base of
the skull through the vertex without contrast.

[Series 3: recon 2: brain · axial · 0.47mm/px · z∈[+152,+304]mm · 16 of 64 slices shown, 20 images]
[im 4/64  brain]
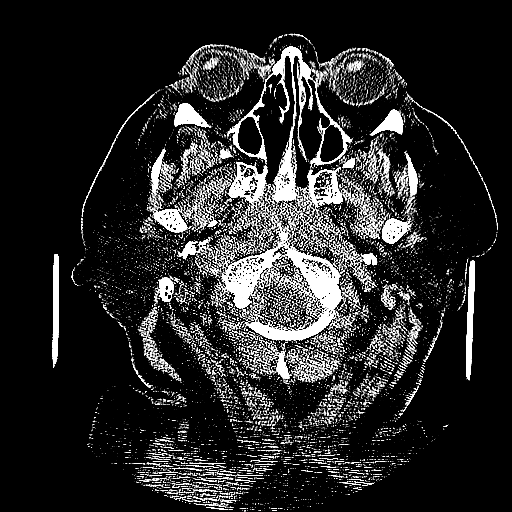
[im 4/64  bone]
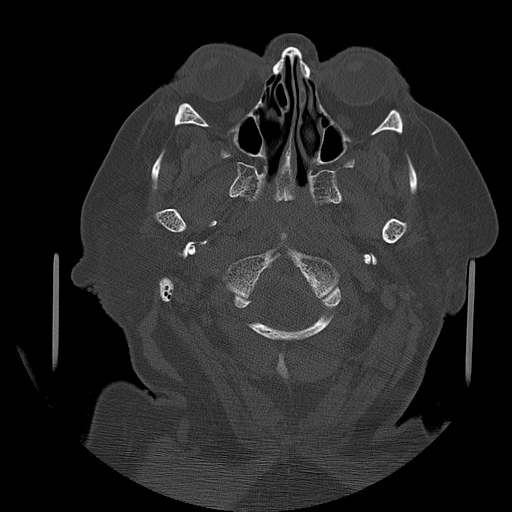
[im 7/64  brain]
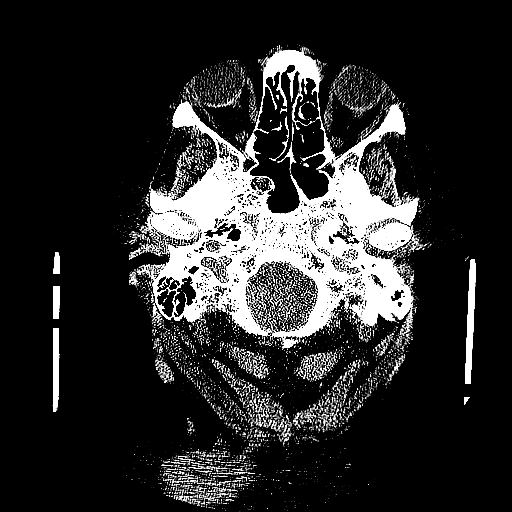
[im 10/64  brain]
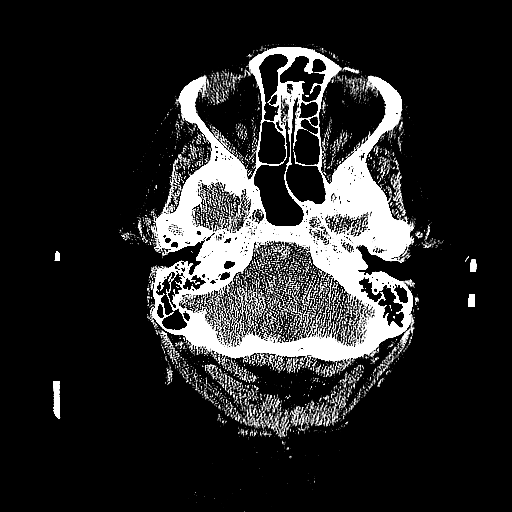
[im 14/64  brain]
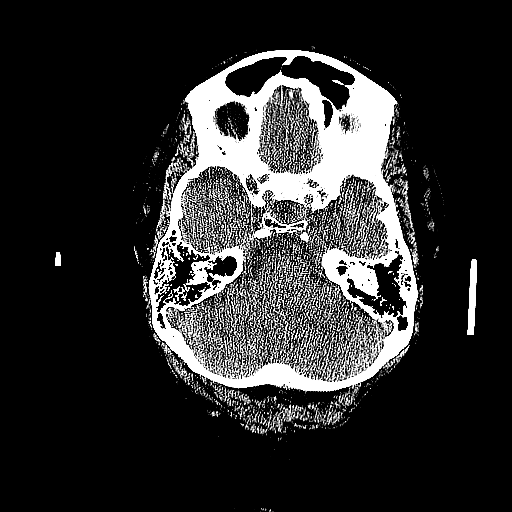
[im 20/64  brain]
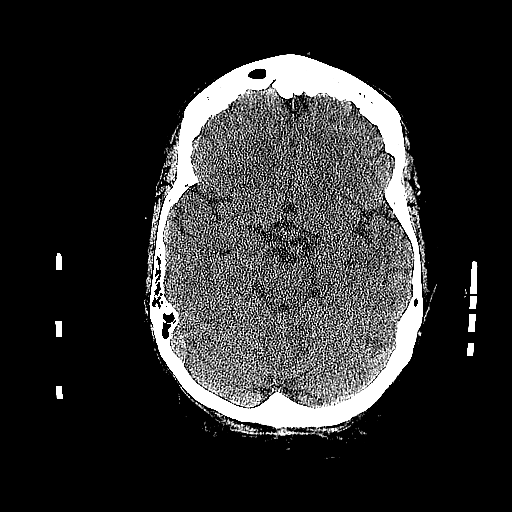
[im 20/64  bone]
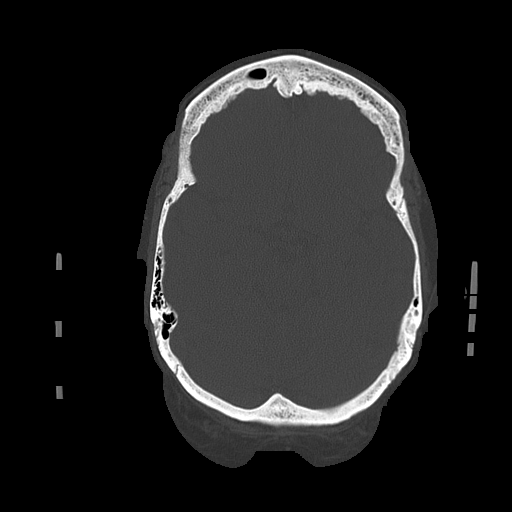
[im 24/64  brain]
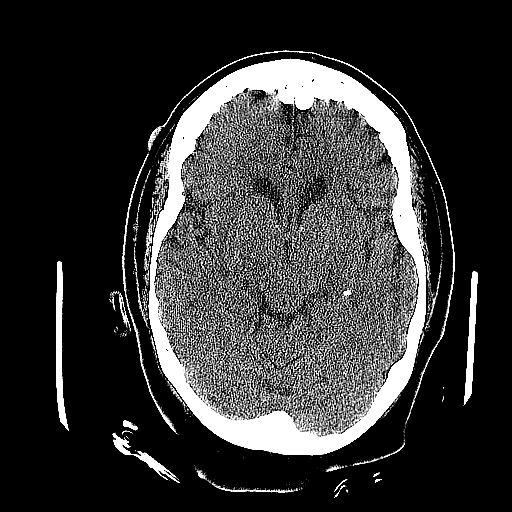
[im 27/64  brain]
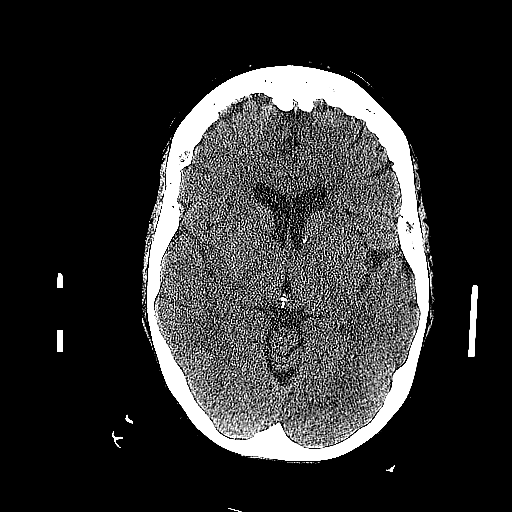
[im 30/64  brain]
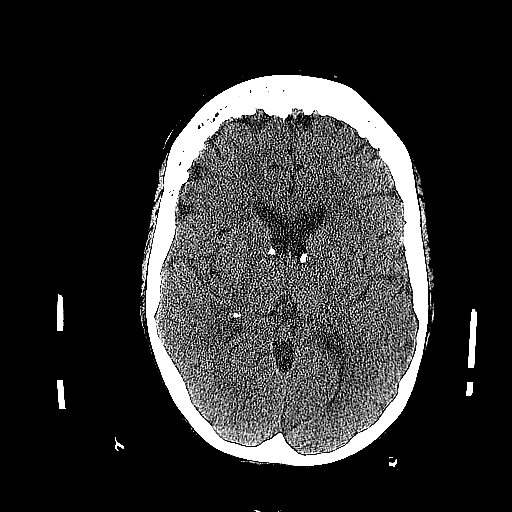
[im 34/64  brain]
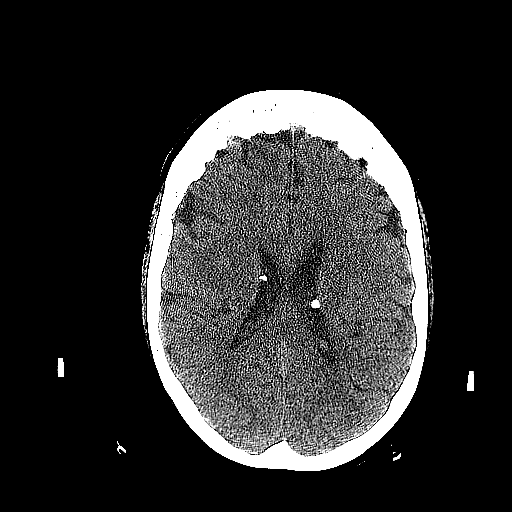
[im 34/64  bone]
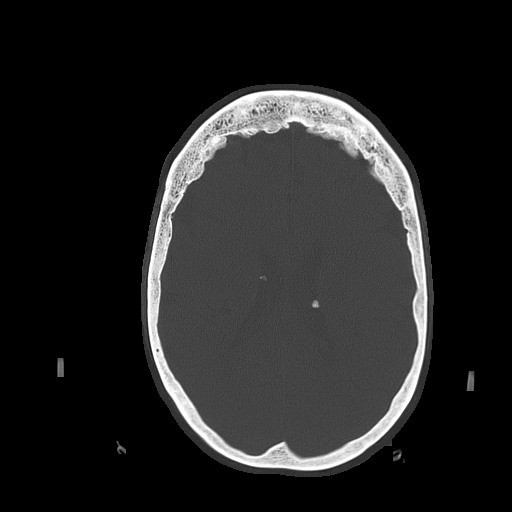
[im 37/64  brain]
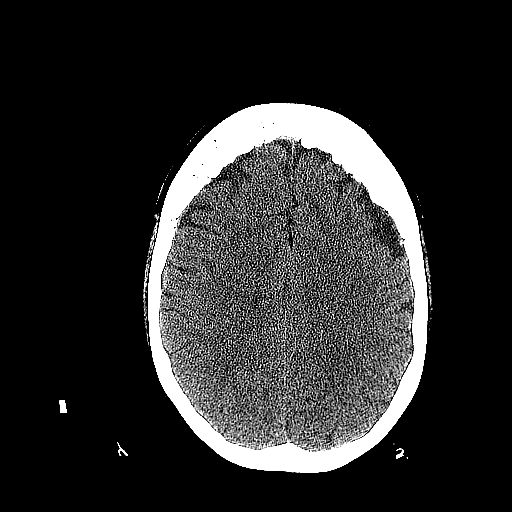
[im 40/64  brain]
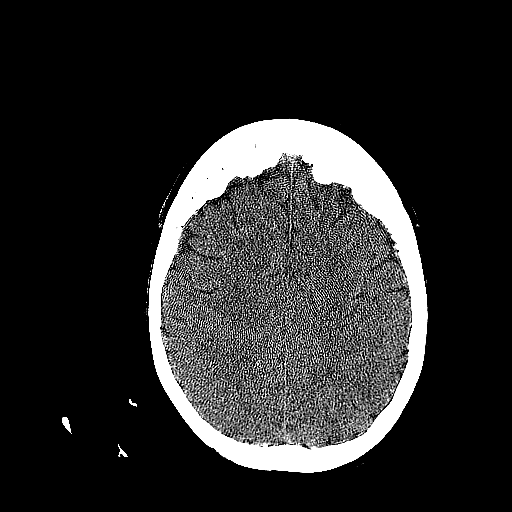
[im 44/64  brain]
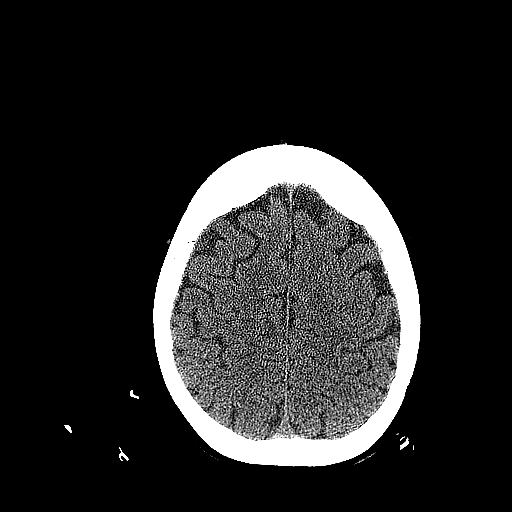
[im 50/64  brain]
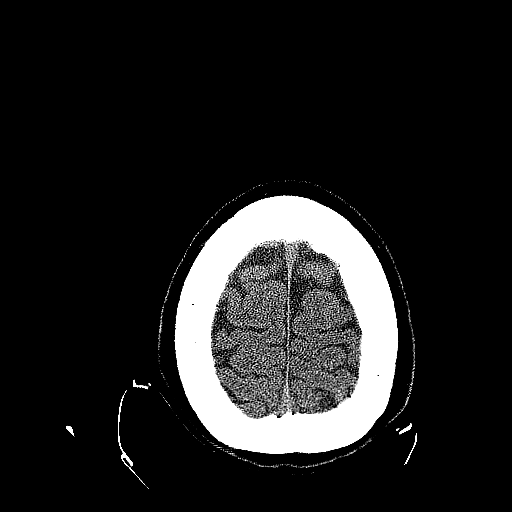
[im 50/64  bone]
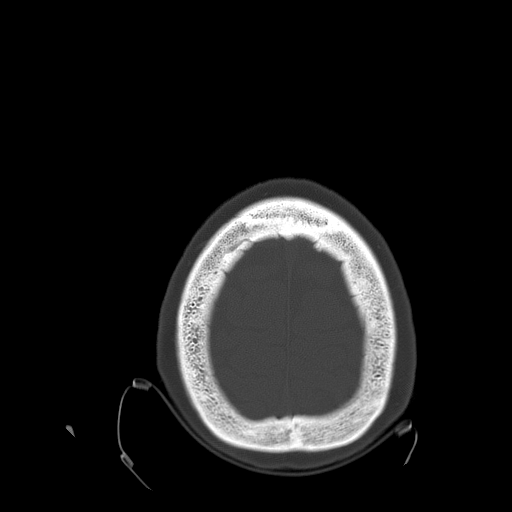
[im 54/64  brain]
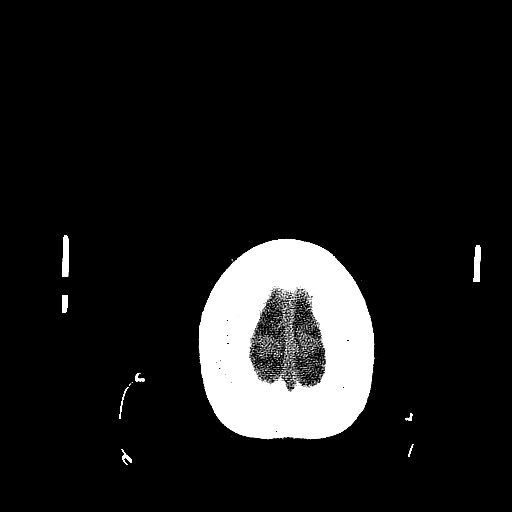
[im 57/64  brain]
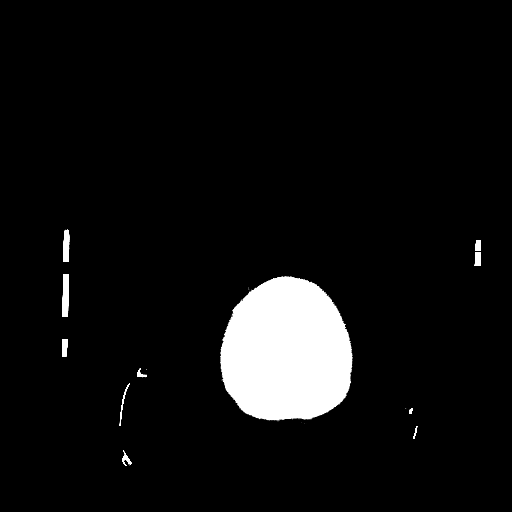
[im 60/64  brain]
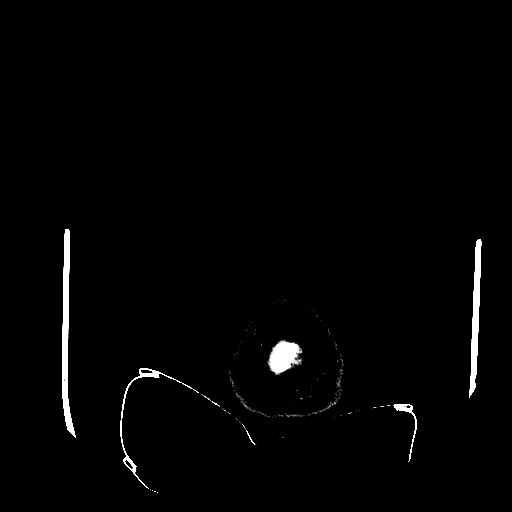

[16 of 30 positions shown; findings below may reference images not displayed]

FINDINGS: No evidence of intraparenchymal hemorrhage or extra-axial
fluid collection.  No mass lesion, mass effect, or midline shift.

No CT evidence of acute infarction.

Stable ventricular size. Again seen are calcified subependymal
tubers along the lateral ventricles, likely reflecting tuberous
sclerosis.

The visualized paranasal sinuses and mastoids are unopacified.

No evidence of calvarial fracture.
IMPRESSION: No acute intracranial abnormality.

Stable findings of tuberous sclerosis.

## 2012-11-03 IMAGING — CR DG CHEST 2V
1 series · 1 of 1 positions shown · non-contrast
Comparison: 06/26/2010

CLINICAL DATA: Weakness

CHEST - 2 VIEW

[w chest lat]
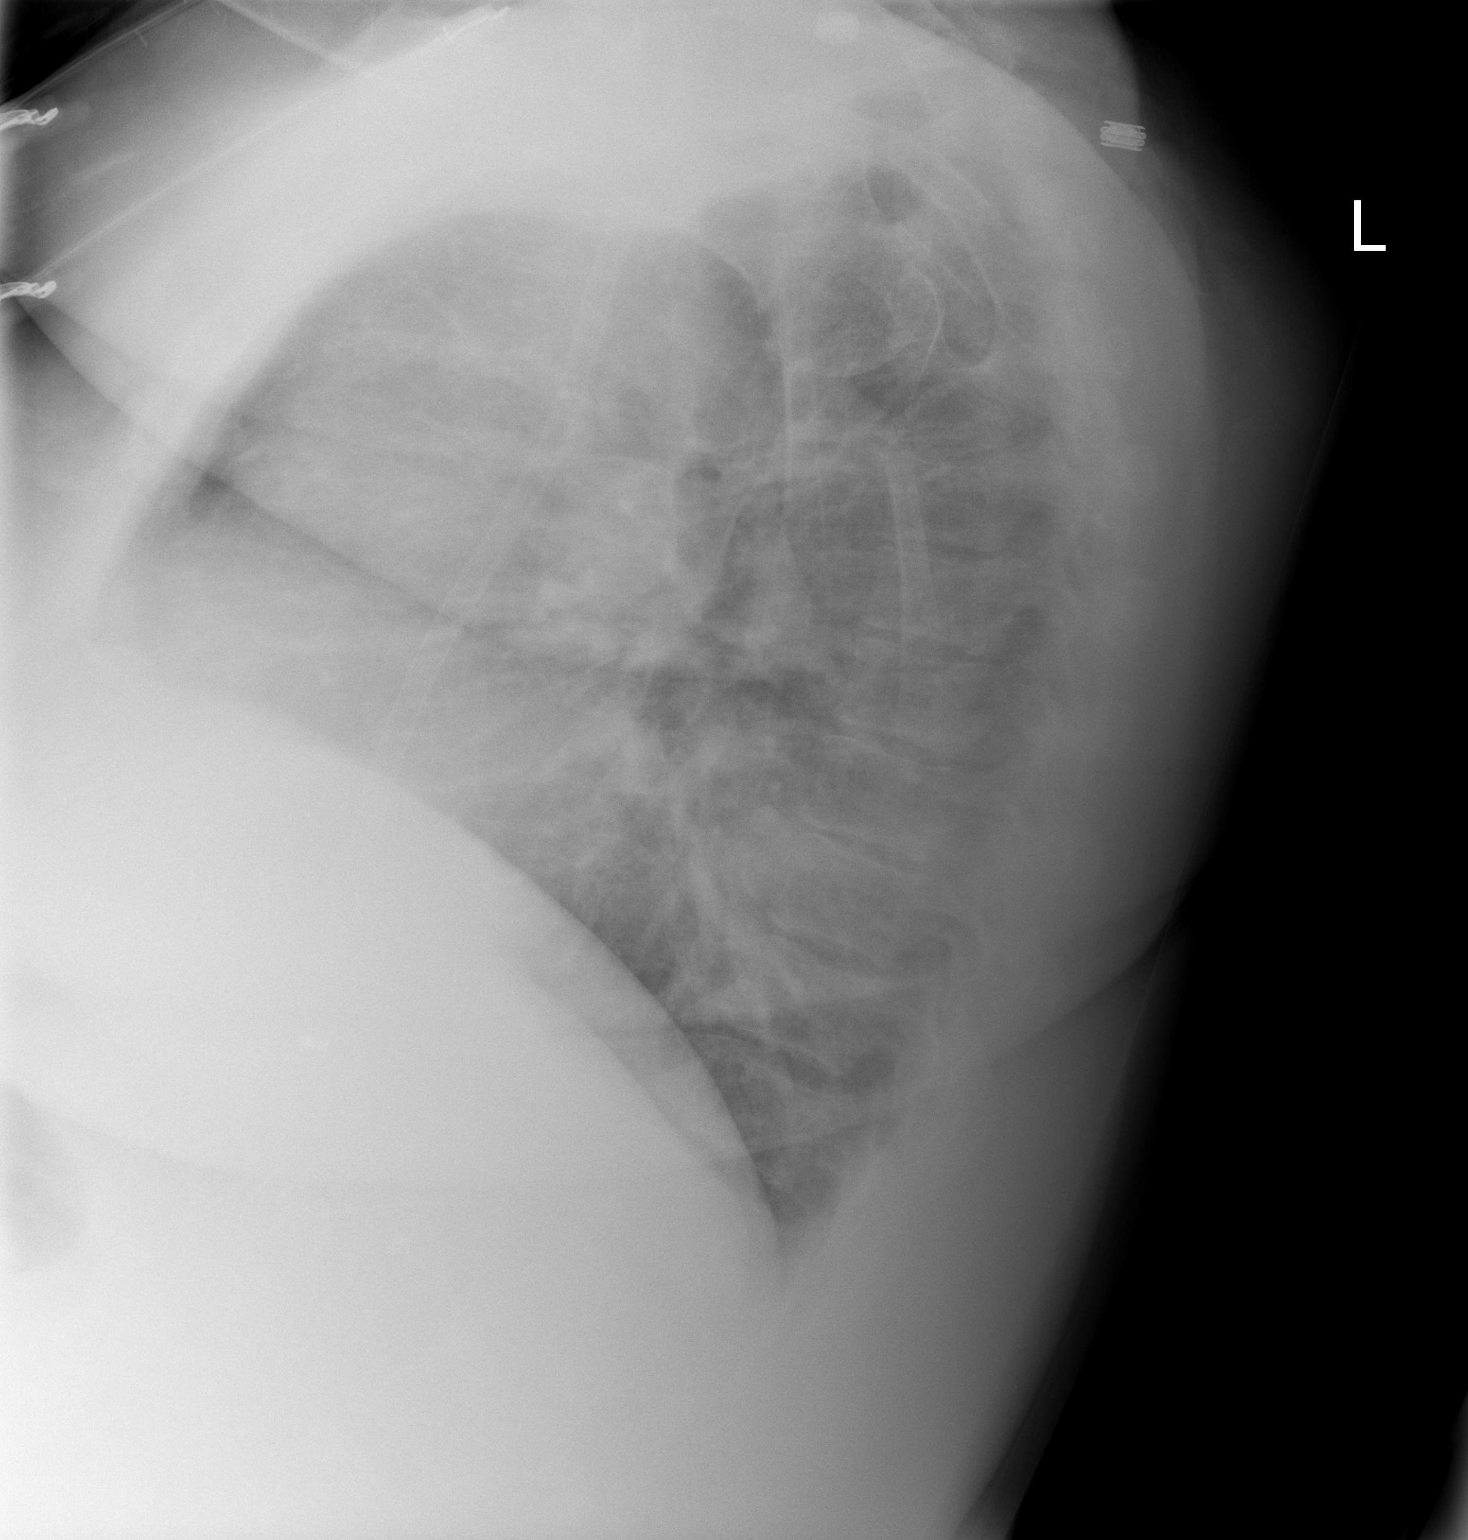

[1 of 1 positions shown; findings below may reference images not displayed]

FINDINGS: Lungs are clear. No pleural effusion or pneumothorax.

Stable cardiomediastinal silhouette.  Stable right HeRO graft.

Mild degenerative changes of the visualized thoracolumbar spine.
IMPRESSION: No evidence of acute cardiopulmonary disease.

## 2013-01-01 ENCOUNTER — Observation Stay: Payer: Self-pay | Admitting: Student

## 2013-01-01 LAB — CBC WITH DIFFERENTIAL/PLATELET
Basophil %: 1.2 %
Eosinophil #: 0.1 10*3/uL (ref 0.0–0.7)
Eosinophil %: 2.3 %
HCT: 30.5 % — ABNORMAL LOW (ref 35.0–47.0)
HGB: 10.1 g/dL — ABNORMAL LOW (ref 12.0–16.0)
Lymphocyte %: 17.9 %
MCH: 33.6 pg (ref 26.0–34.0)
MCHC: 33.1 g/dL (ref 32.0–36.0)
MCV: 102 fL — ABNORMAL HIGH (ref 80–100)
Monocyte #: 0.4 x10 3/mm (ref 0.2–0.9)
Neutrophil #: 4.2 10*3/uL (ref 1.4–6.5)
Neutrophil %: 72.2 %
Platelet: 218 10*3/uL (ref 150–440)

## 2013-01-01 LAB — BASIC METABOLIC PANEL
Anion Gap: 6 — ABNORMAL LOW (ref 7–16)
BUN: 33 mg/dL — ABNORMAL HIGH (ref 7–18)
Calcium, Total: 9.8 mg/dL (ref 8.5–10.1)
Chloride: 97 mmol/L — ABNORMAL LOW (ref 98–107)
Co2: 33 mmol/L — ABNORMAL HIGH (ref 21–32)
Creatinine: 4.18 mg/dL — ABNORMAL HIGH (ref 0.60–1.30)
Glucose: 84 mg/dL (ref 65–99)
Sodium: 136 mmol/L (ref 136–145)

## 2013-01-02 LAB — CBC WITH DIFFERENTIAL/PLATELET
Eosinophil #: 0.1 10*3/uL (ref 0.0–0.7)
MCH: 34.4 pg — ABNORMAL HIGH (ref 26.0–34.0)
MCV: 102 fL — ABNORMAL HIGH (ref 80–100)
Monocyte #: 0.3 x10 3/mm (ref 0.2–0.9)
Monocyte %: 7.7 %
Neutrophil #: 2.3 10*3/uL (ref 1.4–6.5)
Neutrophil %: 60.2 %
Platelet: 176 10*3/uL (ref 150–440)
RBC: 2.84 10*6/uL — ABNORMAL LOW (ref 3.80–5.20)
WBC: 3.8 10*3/uL (ref 3.6–11.0)

## 2013-01-02 LAB — BASIC METABOLIC PANEL
Calcium, Total: 9.3 mg/dL (ref 8.5–10.1)
Co2: 29 mmol/L (ref 21–32)
Creatinine: 5.48 mg/dL — ABNORMAL HIGH (ref 0.60–1.30)
EGFR (African American): 9 — ABNORMAL LOW
EGFR (Non-African Amer.): 8 — ABNORMAL LOW
Glucose: 80 mg/dL (ref 65–99)
Osmolality: 287 (ref 275–301)
Potassium: 5 mmol/L (ref 3.5–5.1)
Sodium: 138 mmol/L (ref 136–145)

## 2013-01-02 LAB — PROTIME-INR
INR: 1
Prothrombin Time: 13.5 secs (ref 11.5–14.7)

## 2013-01-02 LAB — PHOSPHORUS: Phosphorus: 4.9 mg/dL (ref 2.5–4.9)

## 2013-02-04 ENCOUNTER — Inpatient Hospital Stay: Payer: Self-pay | Admitting: Internal Medicine

## 2013-02-04 LAB — CBC WITH DIFFERENTIAL/PLATELET
Basophil %: 0.3 %
Eosinophil #: 0 10*3/uL (ref 0.0–0.7)
Eosinophil %: 1 %
HGB: 9.3 g/dL — ABNORMAL LOW (ref 12.0–16.0)
Lymphocyte %: 16.1 %
MCH: 33.3 pg (ref 26.0–34.0)
MCHC: 32 g/dL (ref 32.0–36.0)
Neutrophil #: 3.4 10*3/uL (ref 1.4–6.5)
Neutrophil %: 80.1 %
RBC: 2.79 10*6/uL — ABNORMAL LOW (ref 3.80–5.20)
WBC: 4.2 10*3/uL (ref 3.6–11.0)

## 2013-02-04 LAB — BASIC METABOLIC PANEL
BUN: 30 mg/dL — ABNORMAL HIGH (ref 7–18)
Chloride: 95 mmol/L — ABNORMAL LOW (ref 98–107)
Creatinine: 4.98 mg/dL — ABNORMAL HIGH (ref 0.60–1.30)
EGFR (African American): 11 — ABNORMAL LOW
EGFR (Non-African Amer.): 9 — ABNORMAL LOW
Glucose: 122 mg/dL — ABNORMAL HIGH (ref 65–99)
Sodium: 132 mmol/L — ABNORMAL LOW (ref 136–145)

## 2013-02-05 LAB — BASIC METABOLIC PANEL
Anion Gap: 5 — ABNORMAL LOW (ref 7–16)
BUN: 41 mg/dL — ABNORMAL HIGH (ref 7–18)
EGFR (African American): 8 — ABNORMAL LOW
EGFR (Non-African Amer.): 7 — ABNORMAL LOW
Glucose: 113 mg/dL — ABNORMAL HIGH (ref 65–99)
Osmolality: 268 (ref 275–301)
Sodium: 128 mmol/L — ABNORMAL LOW (ref 136–145)

## 2013-02-05 LAB — CBC WITH DIFFERENTIAL/PLATELET
Basophil #: 0 10*3/uL (ref 0.0–0.1)
Basophil %: 0.6 %
HGB: 8.9 g/dL — ABNORMAL LOW (ref 12.0–16.0)
MCH: 34.3 pg — ABNORMAL HIGH (ref 26.0–34.0)
MCV: 103 fL — ABNORMAL HIGH (ref 80–100)
Monocyte #: 0.3 x10 3/mm (ref 0.2–0.9)
Neutrophil #: 3.6 10*3/uL (ref 1.4–6.5)
WBC: 5.3 10*3/uL (ref 3.6–11.0)

## 2013-02-05 LAB — PHOSPHORUS: Phosphorus: 4.3 mg/dL (ref 2.5–4.9)

## 2013-02-06 LAB — BASIC METABOLIC PANEL
Co2: 35 mmol/L — ABNORMAL HIGH (ref 21–32)
Creatinine: 4.44 mg/dL — ABNORMAL HIGH (ref 0.60–1.30)
Glucose: 161 mg/dL — ABNORMAL HIGH (ref 65–99)
Osmolality: 268 (ref 275–301)

## 2013-02-06 LAB — CBC WITH DIFFERENTIAL/PLATELET
Basophil #: 0.1 10*3/uL (ref 0.0–0.1)
Basophil %: 1.2 %
Eosinophil %: 2.8 %
HCT: 28 % — ABNORMAL LOW (ref 35.0–47.0)
MCH: 33.3 pg (ref 26.0–34.0)
Monocyte #: 0.3 x10 3/mm (ref 0.2–0.9)
Neutrophil #: 3 10*3/uL (ref 1.4–6.5)
Neutrophil %: 72.8 %
Platelet: 219 10*3/uL (ref 150–440)
RBC: 2.77 10*6/uL — ABNORMAL LOW (ref 3.80–5.20)
WBC: 4.2 10*3/uL (ref 3.6–11.0)

## 2013-02-07 LAB — CLOSTRIDIUM DIFFICILE BY PCR

## 2013-02-07 LAB — OCCULT BLOOD X 1 CARD TO LAB, STOOL: Occult Blood, Feces: POSITIVE

## 2013-02-08 LAB — CBC WITH DIFFERENTIAL/PLATELET
Basophil #: 0.1 10*3/uL (ref 0.0–0.1)
Eosinophil %: 0.5 %
HCT: 25 % — ABNORMAL LOW (ref 35.0–47.0)
Lymphocyte #: 0.7 10*3/uL — ABNORMAL LOW (ref 1.0–3.6)
Lymphocyte %: 15.4 %
MCH: 33 pg (ref 26.0–34.0)
MCHC: 32.5 g/dL (ref 32.0–36.0)
MCV: 102 fL — ABNORMAL HIGH (ref 80–100)
Monocyte #: 0.2 x10 3/mm (ref 0.2–0.9)
Monocyte %: 4.9 %
Neutrophil #: 3.5 10*3/uL (ref 1.4–6.5)
Neutrophil %: 77.9 %
Platelet: 212 10*3/uL (ref 150–440)
RBC: 2.46 10*6/uL — ABNORMAL LOW (ref 3.80–5.20)
RDW: 16 % — ABNORMAL HIGH (ref 11.5–14.5)
WBC: 4.6 10*3/uL (ref 3.6–11.0)

## 2013-02-08 LAB — BASIC METABOLIC PANEL
Anion Gap: 4 — ABNORMAL LOW (ref 7–16)
BUN: 22 mg/dL — ABNORMAL HIGH (ref 7–18)
Calcium, Total: 9.2 mg/dL (ref 8.5–10.1)
Chloride: 97 mmol/L — ABNORMAL LOW (ref 98–107)
Co2: 32 mmol/L (ref 21–32)
EGFR (African American): 13 — ABNORMAL LOW
EGFR (Non-African Amer.): 11 — ABNORMAL LOW
Osmolality: 270 (ref 275–301)
Sodium: 133 mmol/L — ABNORMAL LOW (ref 136–145)

## 2013-02-10 LAB — STOOL CULTURE

## 2013-02-24 ENCOUNTER — Emergency Department: Payer: Self-pay | Admitting: Emergency Medicine

## 2013-02-24 LAB — CBC
HCT: 25 % — ABNORMAL LOW (ref 35.0–47.0)
HGB: 8.5 g/dL — ABNORMAL LOW (ref 12.0–16.0)
MCH: 34.3 pg — ABNORMAL HIGH (ref 26.0–34.0)
MCV: 101 fL — ABNORMAL HIGH (ref 80–100)
Platelet: 199 10*3/uL (ref 150–440)
RDW: 16.3 % — ABNORMAL HIGH (ref 11.5–14.5)

## 2013-02-24 LAB — COMPREHENSIVE METABOLIC PANEL
Alkaline Phosphatase: 119 U/L (ref 50–136)
BUN: 42 mg/dL — ABNORMAL HIGH (ref 7–18)
Bilirubin,Total: 0.4 mg/dL (ref 0.2–1.0)
Calcium, Total: 8.7 mg/dL (ref 8.5–10.1)
Chloride: 98 mmol/L (ref 98–107)
Co2: 30 mmol/L (ref 21–32)
EGFR (African American): 12 — ABNORMAL LOW
Glucose: 79 mg/dL (ref 65–99)
Osmolality: 274 (ref 275–301)
Potassium: 4.9 mmol/L (ref 3.5–5.1)
SGOT(AST): 80 U/L — ABNORMAL HIGH (ref 15–37)
Sodium: 132 mmol/L — ABNORMAL LOW (ref 136–145)
Total Protein: 7.3 g/dL (ref 6.4–8.2)

## 2013-02-24 LAB — APTT: Activated PTT: 31.5 secs (ref 23.6–35.9)

## 2013-02-24 LAB — CK TOTAL AND CKMB (NOT AT ARMC): CK, Total: 215 U/L (ref 21–215)

## 2013-02-24 LAB — TROPONIN I: Troponin-I: 0.04 ng/mL

## 2013-02-24 LAB — PROTIME-INR: Prothrombin Time: 12.5 secs (ref 11.5–14.7)

## 2013-03-26 ENCOUNTER — Ambulatory Visit: Payer: Self-pay | Admitting: Vascular Surgery

## 2013-04-11 ENCOUNTER — Other Ambulatory Visit: Payer: Self-pay | Admitting: Family Medicine

## 2013-04-11 LAB — POTASSIUM: Potassium: 4.4 mmol/L (ref 3.5–5.1)

## 2013-04-13 ENCOUNTER — Ambulatory Visit: Payer: Self-pay | Admitting: Vascular Surgery

## 2013-04-21 ENCOUNTER — Emergency Department: Payer: Self-pay | Admitting: Emergency Medicine

## 2013-04-21 LAB — COMPREHENSIVE METABOLIC PANEL
Albumin: 3.3 g/dL — ABNORMAL LOW (ref 3.4–5.0)
BUN: 65 mg/dL — ABNORMAL HIGH (ref 7–18)
Chloride: 97 mmol/L — ABNORMAL LOW (ref 98–107)
Co2: 29 mmol/L (ref 21–32)
Creatinine: 8.43 mg/dL — ABNORMAL HIGH (ref 0.60–1.30)
EGFR (Non-African Amer.): 5 — ABNORMAL LOW
Potassium: 4.6 mmol/L (ref 3.5–5.1)
SGPT (ALT): 17 U/L (ref 12–78)
Sodium: 135 mmol/L — ABNORMAL LOW (ref 136–145)

## 2013-04-21 LAB — CBC
MCH: 35.7 pg — ABNORMAL HIGH (ref 26.0–34.0)
MCHC: 32.7 g/dL (ref 32.0–36.0)
MCV: 109 fL — ABNORMAL HIGH (ref 80–100)
RDW: 18 % — ABNORMAL HIGH (ref 11.5–14.5)

## 2013-04-28 ENCOUNTER — Encounter: Payer: Medicare Other | Admitting: Internal Medicine

## 2013-05-03 IMAGING — CR DG CHEST 1V PORT
1 series · 1 of 1 positions shown · non-contrast
Comparison: 11/03/2010

CLINICAL DATA: Chest pain, shortness of breath.

PORTABLE CHEST - 1 VIEW

[AP]
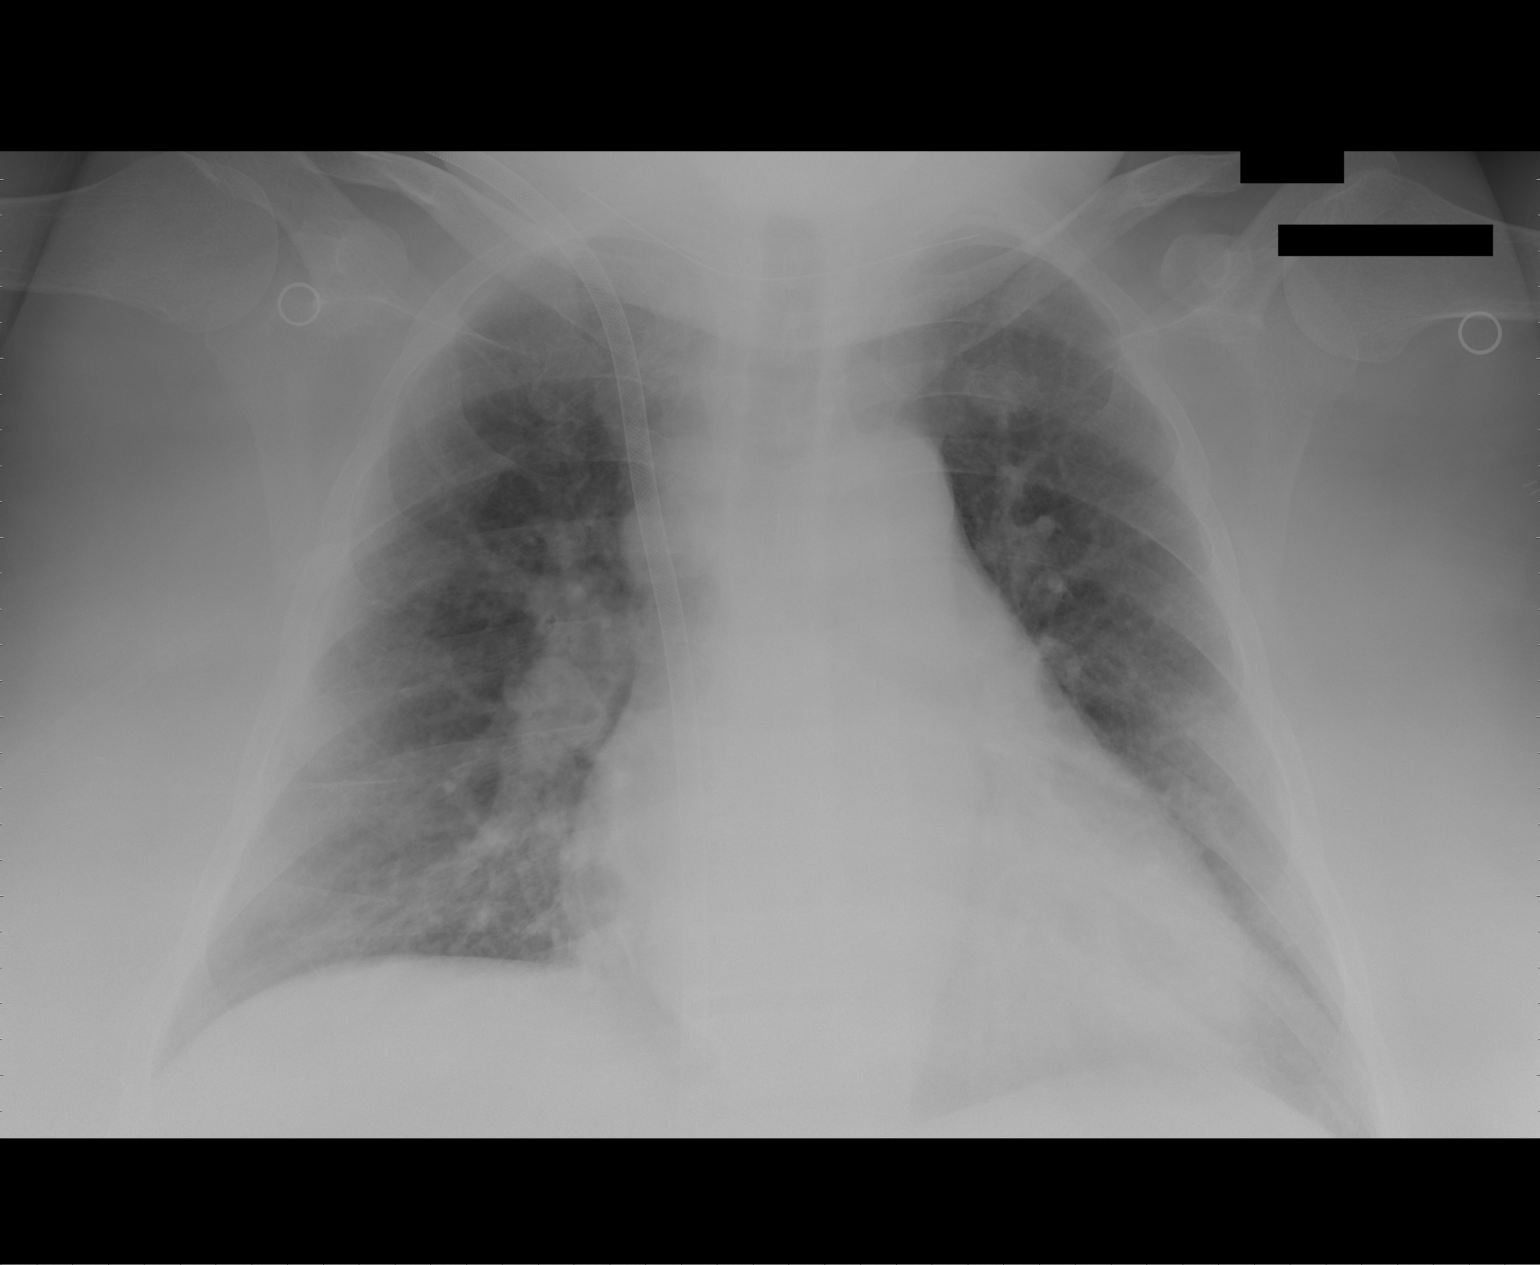

[1 of 1 positions shown; findings below may reference images not displayed]

FINDINGS: Cardiomegaly with vascular congestion, increased since
prior study.  Possible early interstitial edema.  No effusions or
acute bony abnormality.
IMPRESSION: Increasing vascular congestion and interstitial prominence,
possibly interstitial edema.

## 2013-05-08 ENCOUNTER — Other Ambulatory Visit: Payer: Self-pay | Admitting: Family Medicine

## 2013-05-08 LAB — CBC WITH DIFFERENTIAL/PLATELET
Basophil #: 0 10*3/uL (ref 0.0–0.1)
Basophil %: 0.5 %
Eosinophil #: 0.1 10*3/uL (ref 0.0–0.7)
Eosinophil %: 2.2 %
HCT: 30.7 % — ABNORMAL LOW (ref 35.0–47.0)
Lymphocyte #: 1.1 10*3/uL (ref 1.0–3.6)
Lymphocyte %: 19.4 %
MCH: 36 pg — ABNORMAL HIGH (ref 26.0–34.0)
MCV: 109 fL — ABNORMAL HIGH (ref 80–100)
Monocyte %: 6 %
Platelet: 174 10*3/uL (ref 150–440)
RDW: 18.9 % — ABNORMAL HIGH (ref 11.5–14.5)
WBC: 5.8 10*3/uL (ref 3.6–11.0)

## 2013-05-25 IMAGING — CR DG CHEST 2V
2 series · 2 of 2 positions shown · non-contrast
Comparison: 05/03/2011

CLINICAL DATA: Shortness of breath.

CHEST - 2 VIEW

[w chest pa]
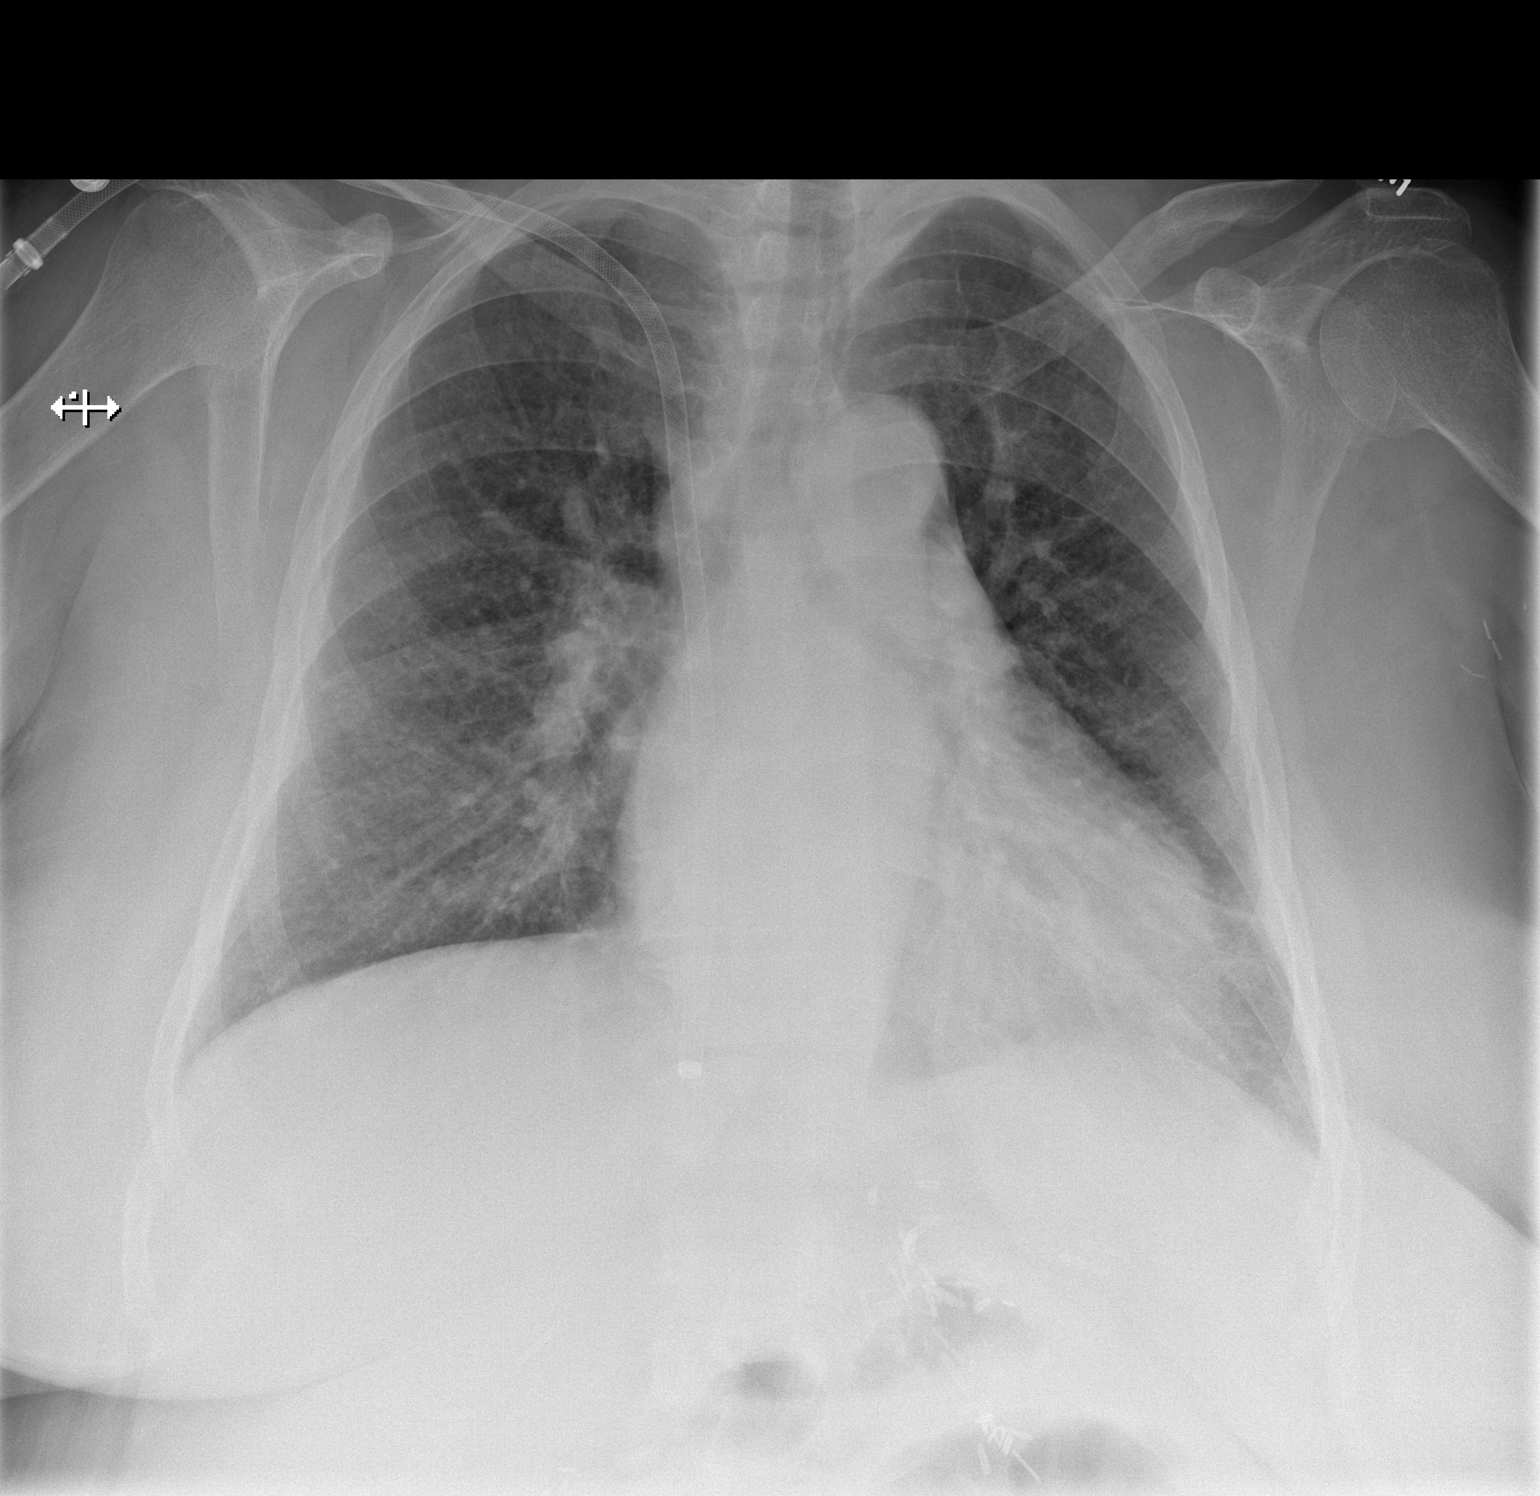

[w chest lat]
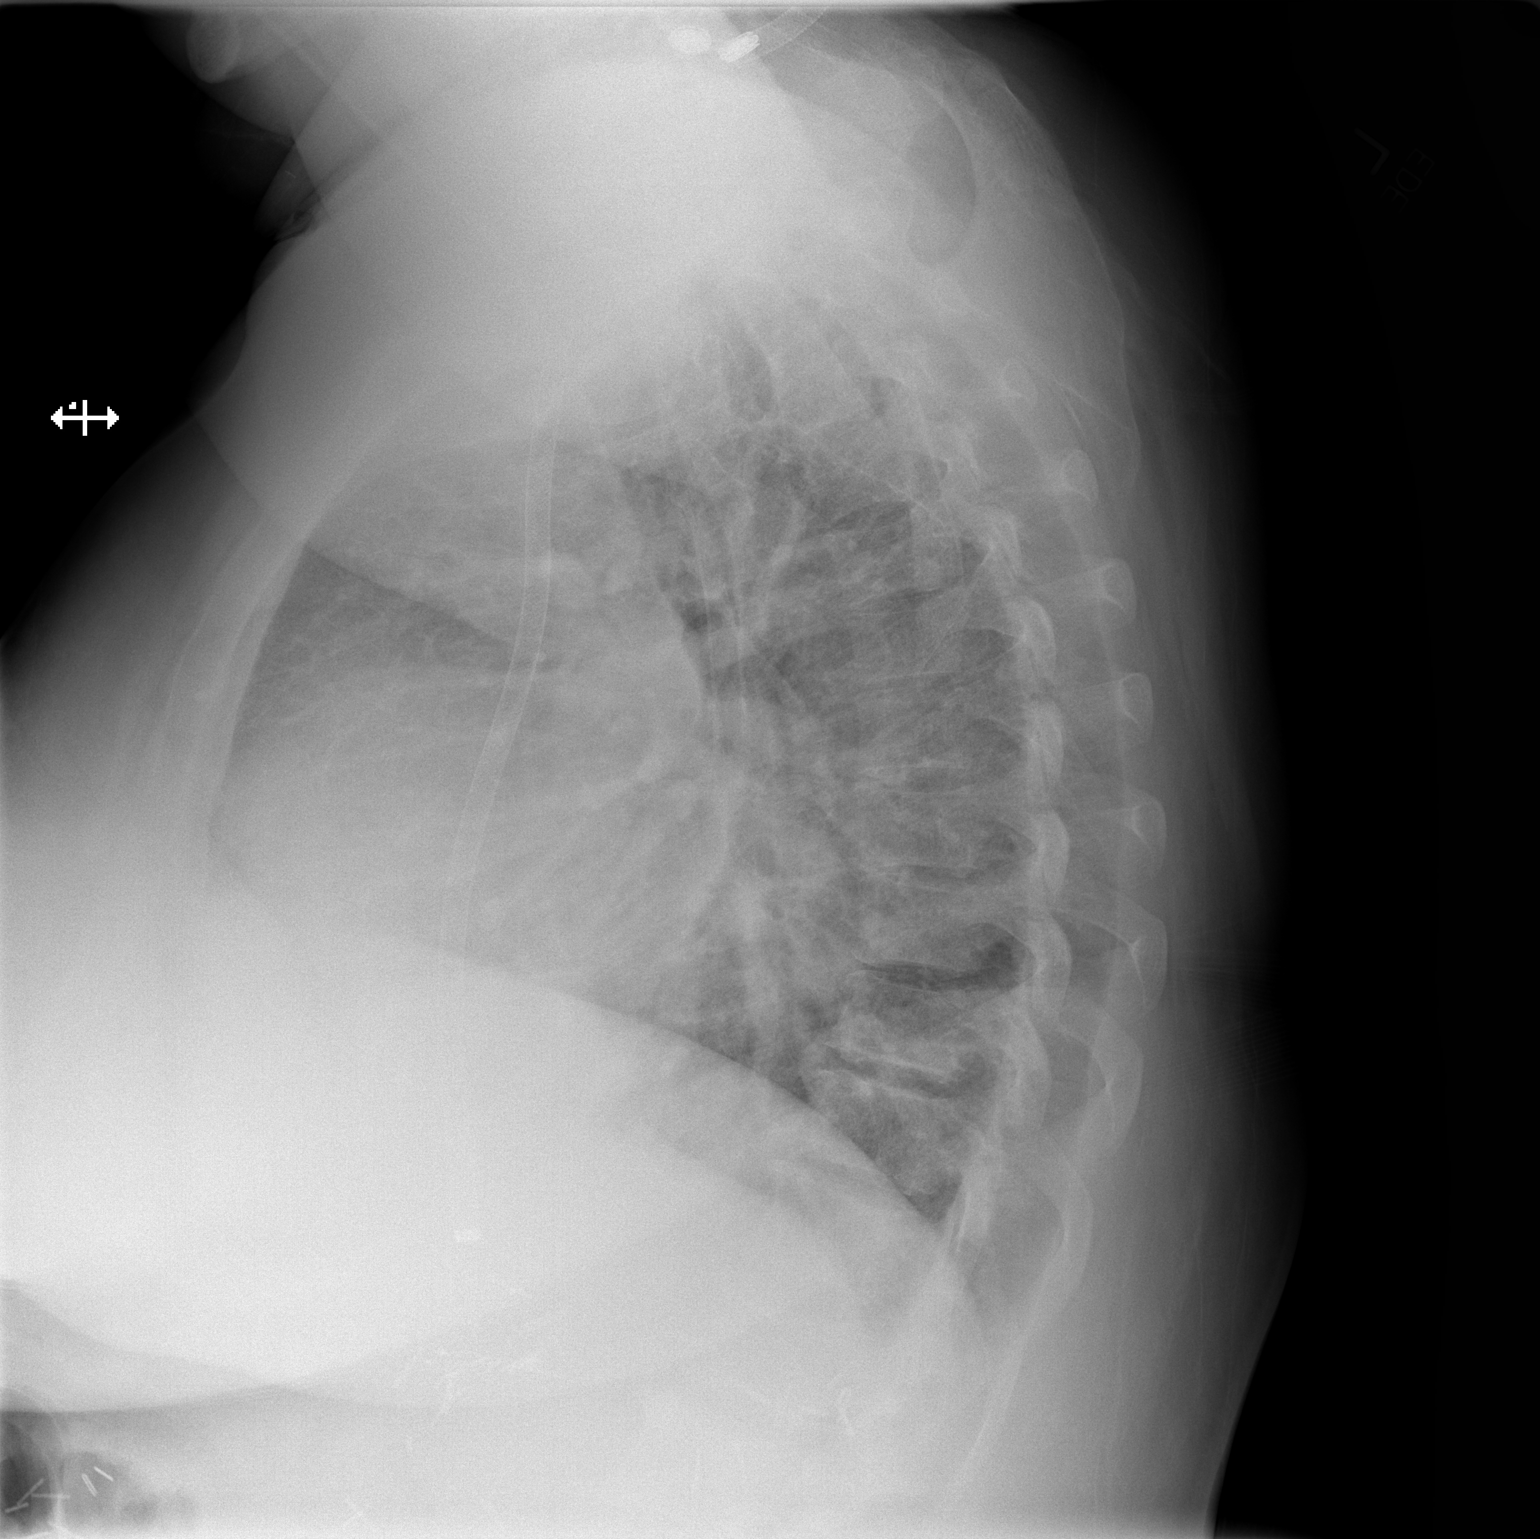

[2 of 2 positions shown; findings below may reference images not displayed]

FINDINGS: The cardiopericardial silhouette is enlarged.
Interstitial markings are diffusely coarsened with chronic
features.  Right sided Grigorian dialysis graft noted. Imaged bony
structures of the thorax are intact.
IMPRESSION: Cardiomegaly with chronic interstitial coarsening.  Vascular
congestion with possible mild interstitial pulmonary edema.

## 2013-05-25 IMAGING — CT CT MAXILLOFACIAL W/O CM
3 of 5 series · 16 of 47 positions shown, 19 images · non-contrast
Comparison: 11/03/2010 CT and 05/30/2006.

CT HEAD

CLINICAL DATA: Fall.  Denies loss of consciousness.

CT HEAD WITHOUT CONTRAST
CT MAXILLOFACIAL WITHOUT CONTRAST
TECHNIQUE: Multidetector CT imaging of the head and maxillofacial
structures were performed using the standard protocol without
intravenous contrast. Multiplanar CT image reconstructions of the
maxillofacial structures were also generated.

[Series 3: recon 2: brain · axial · 0.47mm/px · z∈[-126,+13]mm · 10 of 56 slices shown, 13 images]
[im 3/56  brain]
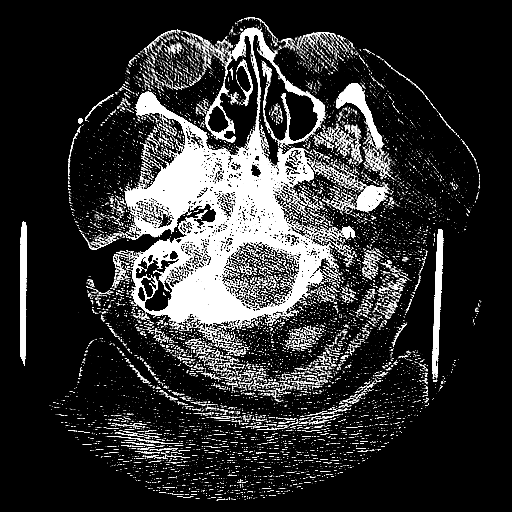
[im 3/56  bone]
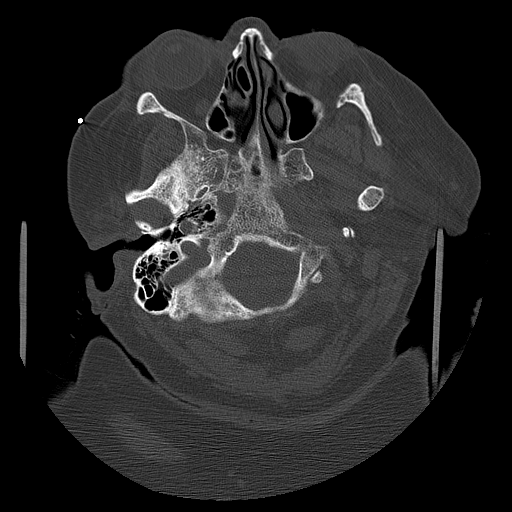
[im 9/56  bone]
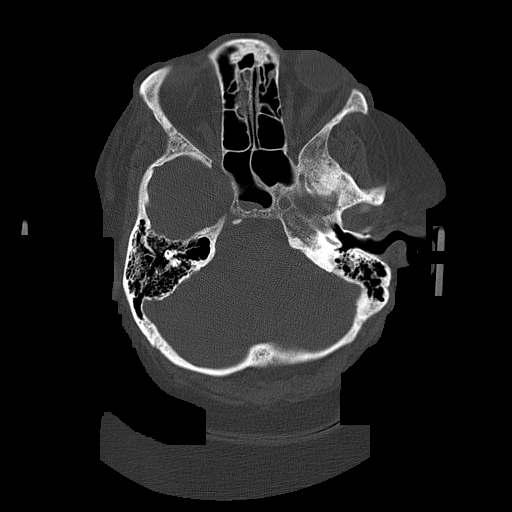
[im 15/56  bone]
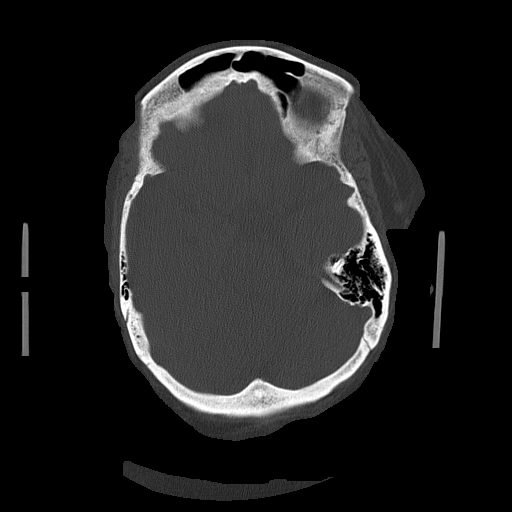
[im 21/56  bone]
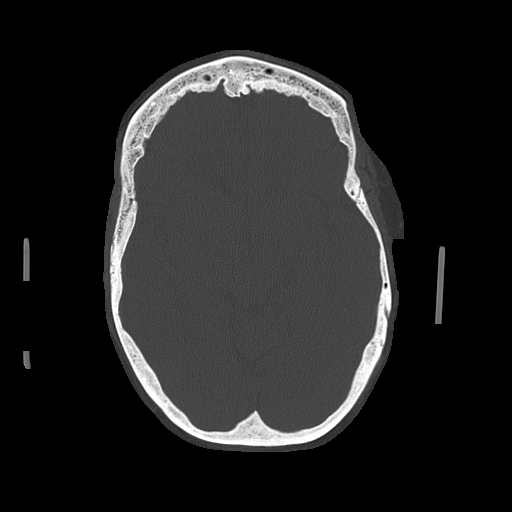
[im 27/56  brain]
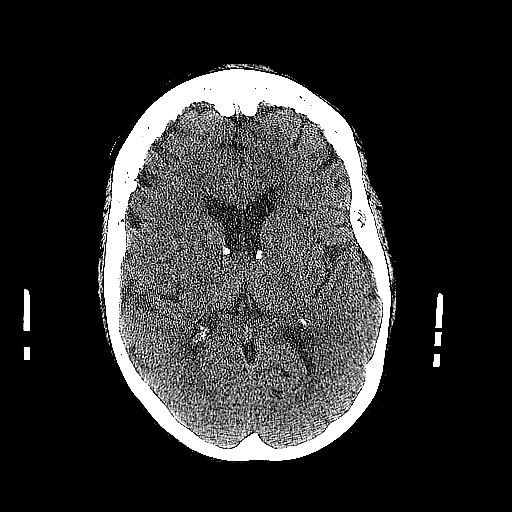
[im 27/56  bone]
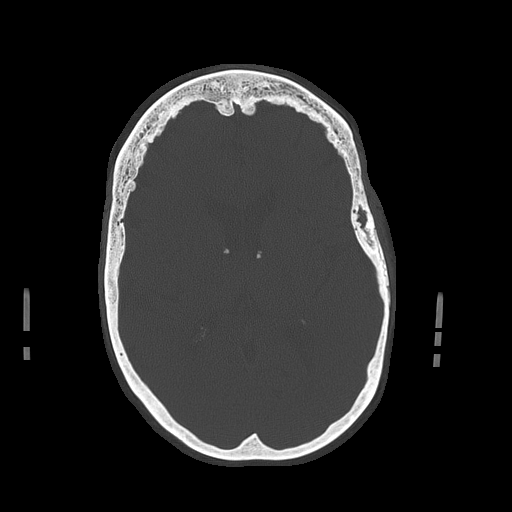
[im 29/56  bone]
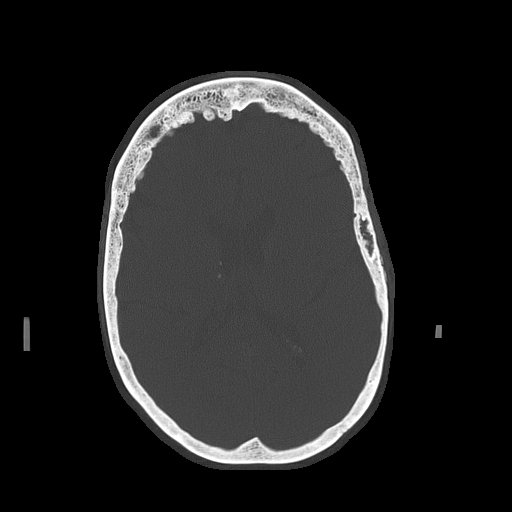
[im 35/56  bone]
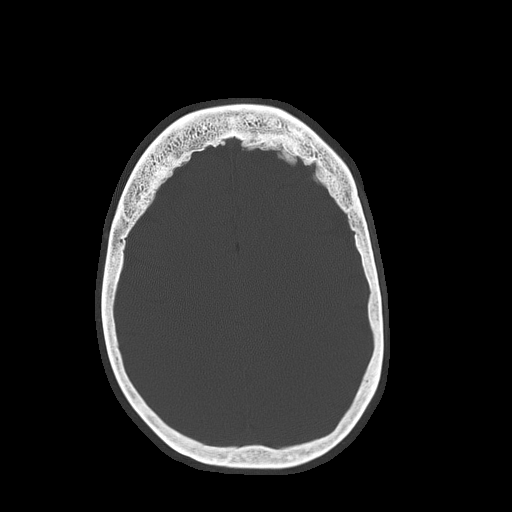
[im 41/56  bone]
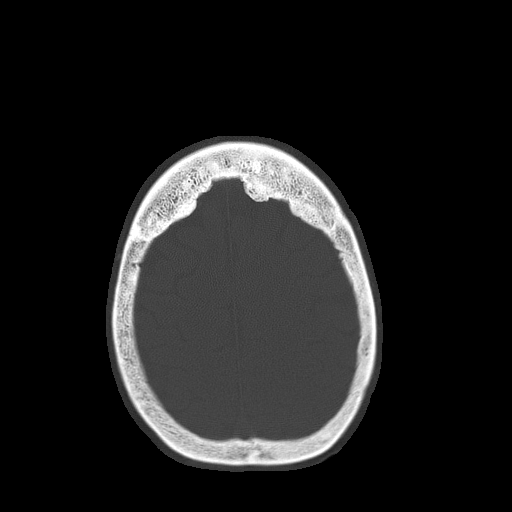
[im 47/56  brain]
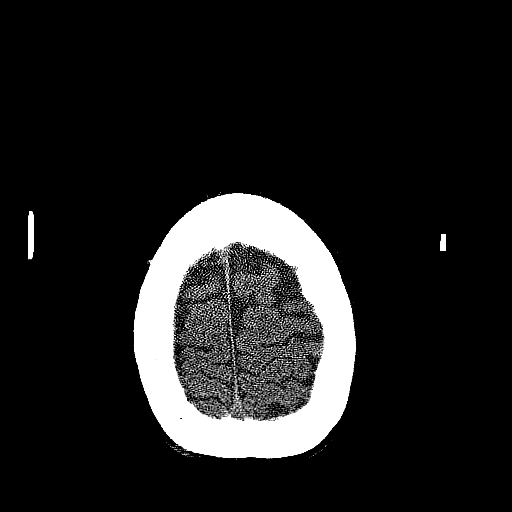
[im 47/56  bone]
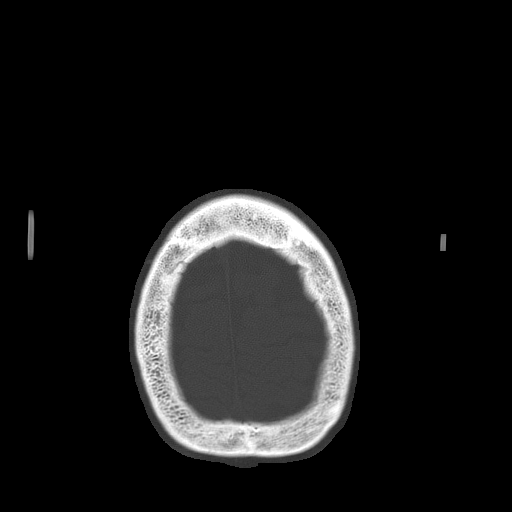
[im 53/56  bone]
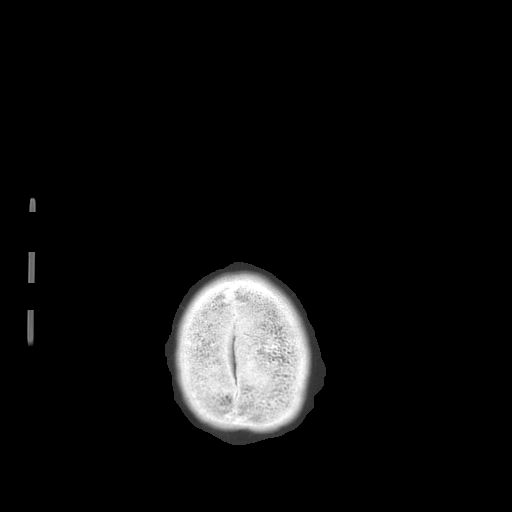

[Series 105: sta coronals · coronal · 0.45mm/px · 3 of 73 slices shown]
[im 19/73  bone]
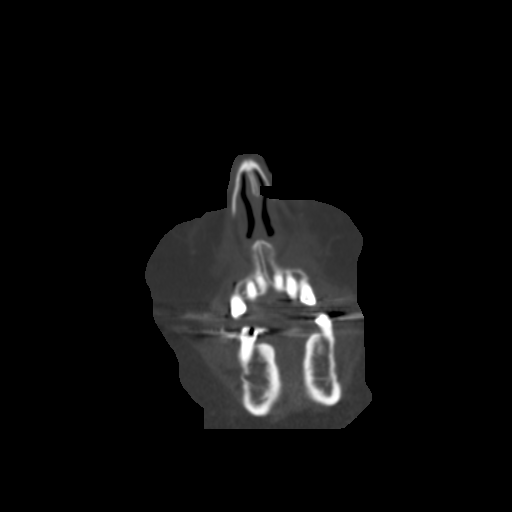
[im 37/73  bone]
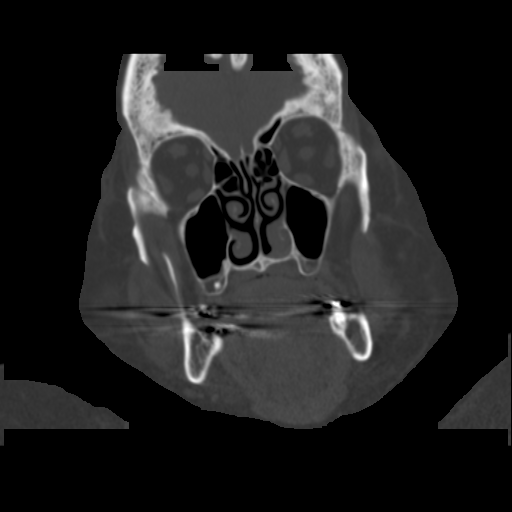
[im 55/73  bone]
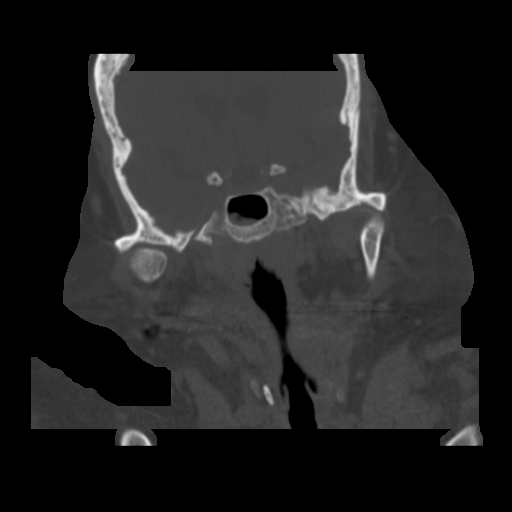

[Series 106: sta sagittals · sagittal · 0.45mm/px · 3 of 96 slices shown]
[im 35/96  bone]
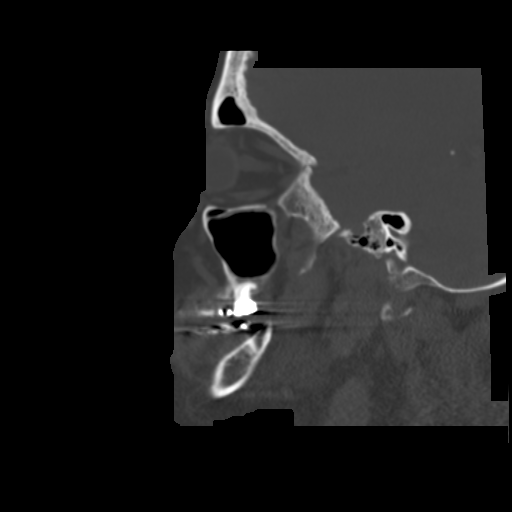
[im 48/96  bone]
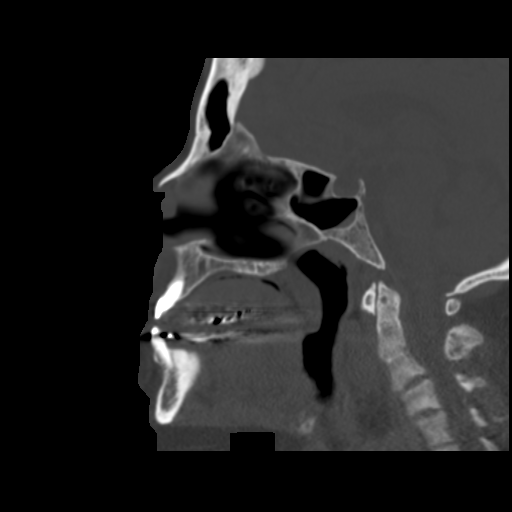
[im 61/96  bone]
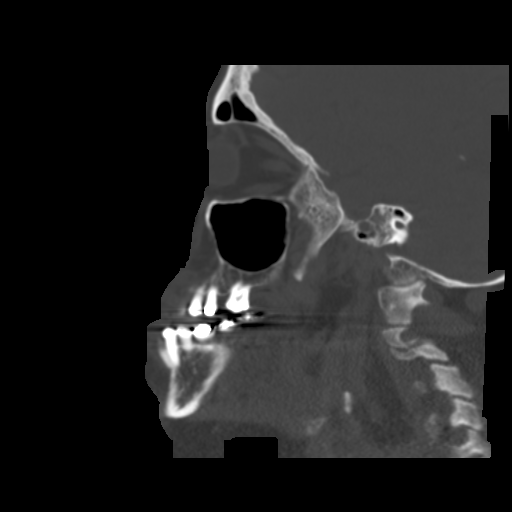

[16 of 47 positions shown; findings below may reference images not displayed]

FINDINGS: Anterior frontal subcutaneous hematoma without underlying
skull fracture or intracranial hemorrhage.

No CT evidence of large acute infarct.  Small acute infarct cannot
be excluded by CT.

No intracranial mass lesion detected on this unenhanced exam.

Hyperostosis frontalis interna.

Minimal mucosal thickening right sphenoid sinus air cell without
air fluid level to indicate underlying skull fracture.  Remainder
of visualized sinuses and mastoid air cells are clear.

Left frontal calvarial nonspecific lesion possibly representing
fibrous dysplasia unchanged from remote exam from 3556.
IMPRESSION: No skull fracture or intracranial hemorrhage.  Please see above.

CT MAXILLOFACIAL
FINDINGS: No facial fracture.

Anterior subluxation mandibular condyle may be related to the jaw
positioning with subluxation more notable on the right.  Clinical
correlation recommended.

Fusion C2-3.  Limited imaging of the upper cervical spine without
obvious fracture.
IMPRESSION: No facial fracture.

Anterior subluxation mandibular condyle may be related to the jaw
positioning with subluxation more notable on the right.  Clinical
correlation recommended.

## 2013-05-26 ENCOUNTER — Ambulatory Visit: Payer: Self-pay | Admitting: Vascular Surgery

## 2013-06-24 ENCOUNTER — Ambulatory Visit: Payer: Self-pay | Admitting: Vascular Surgery

## 2013-07-05 ENCOUNTER — Other Ambulatory Visit: Payer: Self-pay | Admitting: Family Medicine

## 2013-07-05 LAB — BASIC METABOLIC PANEL
Anion Gap: 7 (ref 7–16)
BUN: 42 mg/dL — ABNORMAL HIGH (ref 7–18)
Calcium, Total: 9.8 mg/dL (ref 8.5–10.1)
Co2: 28 mmol/L (ref 21–32)
EGFR (African American): 8 — ABNORMAL LOW
Glucose: 93 mg/dL (ref 65–99)
Osmolality: 275 (ref 275–301)
Potassium: 5 mmol/L (ref 3.5–5.1)
Sodium: 132 mmol/L — ABNORMAL LOW (ref 136–145)

## 2013-07-06 ENCOUNTER — Inpatient Hospital Stay: Payer: Self-pay | Admitting: Family Medicine

## 2013-07-06 LAB — COMPREHENSIVE METABOLIC PANEL
Albumin: 3.6 g/dL (ref 3.4–5.0)
Anion Gap: 7 (ref 7–16)
Bilirubin,Total: 0.4 mg/dL (ref 0.2–1.0)
Calcium, Total: 10.1 mg/dL (ref 8.5–10.1)
Chloride: 97 mmol/L — ABNORMAL LOW (ref 98–107)
Co2: 27 mmol/L (ref 21–32)
Creatinine: 8 mg/dL — ABNORMAL HIGH (ref 0.60–1.30)
EGFR (African American): 6 — ABNORMAL LOW
EGFR (Non-African Amer.): 5 — ABNORMAL LOW
Glucose: 100 mg/dL — ABNORMAL HIGH (ref 65–99)
Osmolality: 280 (ref 275–301)
SGOT(AST): 9 U/L — ABNORMAL LOW (ref 15–37)
SGPT (ALT): 14 U/L (ref 12–78)
Sodium: 131 mmol/L — ABNORMAL LOW (ref 136–145)

## 2013-07-06 LAB — CBC
HGB: 8.4 g/dL — ABNORMAL LOW (ref 12.0–16.0)
MCH: 32.5 pg (ref 26.0–34.0)
MCHC: 31.3 g/dL — ABNORMAL LOW (ref 32.0–36.0)
MCV: 104 fL — ABNORMAL HIGH (ref 80–100)
RBC: 2.6 10*6/uL — ABNORMAL LOW (ref 3.80–5.20)

## 2013-07-06 LAB — TROPONIN I: Troponin-I: <0.02 ng/mL

## 2013-07-07 LAB — CBC WITH DIFFERENTIAL/PLATELET
Basophil #: 0 10*3/uL (ref 0.0–0.1)
Basophil %: 0.4 %
Eosinophil #: 0.2 10*3/uL (ref 0.0–0.7)
HCT: 29.3 % — ABNORMAL LOW (ref 35.0–47.0)
HGB: 9.6 g/dL — ABNORMAL LOW (ref 12.0–16.0)
Lymphocyte #: 1.4 10*3/uL (ref 1.0–3.6)
MCHC: 32.8 g/dL (ref 32.0–36.0)
MCV: 103 fL — ABNORMAL HIGH (ref 80–100)
Monocyte %: 4.9 %
Neutrophil #: 6 10*3/uL (ref 1.4–6.5)
Neutrophil %: 74.6 %
RBC: 2.84 10*6/uL — ABNORMAL LOW (ref 3.80–5.20)
RDW: 19.5 % — ABNORMAL HIGH (ref 11.5–14.5)

## 2013-07-07 LAB — BASIC METABOLIC PANEL
Creatinine: 8.93 mg/dL — ABNORMAL HIGH (ref 0.60–1.30)
EGFR (African American): 5 — ABNORMAL LOW
EGFR (Non-African Amer.): 4 — ABNORMAL LOW
Osmolality: 278 (ref 275–301)
Potassium: 5.6 mmol/L — ABNORMAL HIGH (ref 3.5–5.1)

## 2013-07-08 LAB — CBC WITH DIFFERENTIAL/PLATELET
Basophil #: 0 10*3/uL (ref 0.0–0.1)
Basophil %: 0.2 %
Eosinophil %: 0.5 %
HCT: 26.2 % — ABNORMAL LOW (ref 35.0–47.0)
Lymphocyte #: 0.7 10*3/uL — ABNORMAL LOW (ref 1.0–3.6)
Lymphocyte %: 13.1 %
MCHC: 32.4 g/dL (ref 32.0–36.0)
MCV: 102 fL — ABNORMAL HIGH (ref 80–100)
Monocyte #: 0.1 x10 3/mm — ABNORMAL LOW (ref 0.2–0.9)
Neutrophil %: 83.3 %
RDW: 18.9 % — ABNORMAL HIGH (ref 11.5–14.5)
WBC: 5.1 10*3/uL (ref 3.6–11.0)

## 2013-07-08 LAB — BASIC METABOLIC PANEL
Anion Gap: 8 (ref 7–16)
EGFR (African American): 6 — ABNORMAL LOW
EGFR (Non-African Amer.): 5 — ABNORMAL LOW
Glucose: 128 mg/dL — ABNORMAL HIGH (ref 65–99)
Osmolality: 273 (ref 275–301)
Potassium: 5.2 mmol/L — ABNORMAL HIGH (ref 3.5–5.1)
Sodium: 128 mmol/L — ABNORMAL LOW (ref 136–145)

## 2013-07-10 LAB — CBC WITH DIFFERENTIAL/PLATELET
Basophil #: 0 10*3/uL (ref 0.0–0.1)
Basophil %: 0.3 %
Eosinophil #: 0 10*3/uL (ref 0.0–0.7)
Eosinophil %: 0.3 %
Lymphocyte #: 1.3 10*3/uL (ref 1.0–3.6)
Lymphocyte %: 16.3 %
MCH: 34.2 pg — ABNORMAL HIGH (ref 26.0–34.0)
MCHC: 33.3 g/dL (ref 32.0–36.0)
MCV: 103 fL — ABNORMAL HIGH (ref 80–100)
Monocyte #: 0.4 x10 3/mm (ref 0.2–0.9)
Neutrophil #: 6.1 10*3/uL (ref 1.4–6.5)
Neutrophil %: 78.4 %
RBC: 2.64 10*6/uL — ABNORMAL LOW (ref 3.80–5.20)
WBC: 7.7 10*3/uL (ref 3.6–11.0)

## 2013-07-10 LAB — BASIC METABOLIC PANEL
Anion Gap: 8 (ref 7–16)
Calcium, Total: 9.4 mg/dL (ref 8.5–10.1)
Chloride: 88 mmol/L — ABNORMAL LOW (ref 98–107)
Co2: 26 mmol/L (ref 21–32)
Creatinine: 9.44 mg/dL — ABNORMAL HIGH (ref 0.60–1.30)
EGFR (Non-African Amer.): 4 — ABNORMAL LOW
Osmolality: 276 (ref 275–301)

## 2013-07-10 LAB — RENAL FUNCTION PANEL
Albumin: 3.4 g/dL (ref 3.4–5.0)
Anion Gap: 10 (ref 7–16)
BUN: 95 mg/dL — ABNORMAL HIGH (ref 7–18)
Calcium, Total: 9.9 mg/dL (ref 8.5–10.1)
Co2: 24 mmol/L (ref 21–32)
Creatinine: 9.9 mg/dL — ABNORMAL HIGH (ref 0.60–1.30)
EGFR (African American): 5 — ABNORMAL LOW
Glucose: 168 mg/dL — ABNORMAL HIGH (ref 65–99)
Osmolality: 274 (ref 275–301)
Phosphorus: 3.9 mg/dL (ref 2.5–4.9)

## 2013-07-11 LAB — CULTURE, BLOOD (SINGLE)

## 2013-07-13 LAB — RENAL FUNCTION PANEL
Anion Gap: 9 (ref 7–16)
Calcium, Total: 9.6 mg/dL (ref 8.5–10.1)
Co2: 27 mmol/L (ref 21–32)
Creatinine: 7.61 mg/dL — ABNORMAL HIGH (ref 0.60–1.30)
EGFR (African American): 6 — ABNORMAL LOW
Glucose: 135 mg/dL — ABNORMAL HIGH (ref 65–99)
Potassium: 5.1 mmol/L (ref 3.5–5.1)
Sodium: 125 mmol/L — ABNORMAL LOW (ref 136–145)

## 2013-07-14 LAB — PHOSPHORUS: Phosphorus: 3.5 mg/dL (ref 2.5–4.9)

## 2013-07-15 LAB — PLATELET COUNT: Platelet: 159 10*3/uL (ref 150–440)

## 2013-07-16 LAB — PHOSPHORUS: Phosphorus: 4.3 mg/dL (ref 2.5–4.9)

## 2013-07-30 ENCOUNTER — Emergency Department: Payer: Self-pay | Admitting: Emergency Medicine

## 2013-07-30 LAB — COMPREHENSIVE METABOLIC PANEL
ALBUMIN: 3.8 g/dL (ref 3.4–5.0)
ALT: 18 U/L (ref 12–78)
ANION GAP: 7 (ref 7–16)
AST: 18 U/L (ref 15–37)
Alkaline Phosphatase: 142 U/L — ABNORMAL HIGH
BILIRUBIN TOTAL: 0.4 mg/dL (ref 0.2–1.0)
BUN: 26 mg/dL — ABNORMAL HIGH (ref 7–18)
Calcium, Total: 9.5 mg/dL (ref 8.5–10.1)
Chloride: 96 mmol/L — ABNORMAL LOW (ref 98–107)
Co2: 30 mmol/L (ref 21–32)
Creatinine: 4.18 mg/dL — ABNORMAL HIGH (ref 0.60–1.30)
EGFR (Non-African Amer.): 11 — ABNORMAL LOW
GFR CALC AF AMER: 13 — AB
Glucose: 89 mg/dL (ref 65–99)
OSMOLALITY: 271 (ref 275–301)
POTASSIUM: 3.9 mmol/L (ref 3.5–5.1)
SODIUM: 133 mmol/L — AB (ref 136–145)
Total Protein: 7.8 g/dL (ref 6.4–8.2)

## 2013-07-30 LAB — CBC
HCT: 27.4 % — ABNORMAL LOW (ref 35.0–47.0)
HGB: 9 g/dL — ABNORMAL LOW (ref 12.0–16.0)
MCH: 33.5 pg (ref 26.0–34.0)
MCHC: 32.9 g/dL (ref 32.0–36.0)
MCV: 102 fL — AB (ref 80–100)
PLATELETS: 186 10*3/uL (ref 150–440)
RBC: 2.69 10*6/uL — AB (ref 3.80–5.20)
RDW: 19.3 % — ABNORMAL HIGH (ref 11.5–14.5)
WBC: 5.3 10*3/uL (ref 3.6–11.0)

## 2013-07-30 LAB — TROPONIN I: Troponin-I: <0.02 ng/mL

## 2013-07-30 LAB — CK TOTAL AND CKMB (NOT AT ARMC): CK, TOTAL: 17 U/L — AB (ref 21–215)

## 2013-07-31 IMAGING — CT CT HEAD W/O CM
1 series · 16 of 30 positions shown, 20 images · non-contrast
Comparison: 05/25/2011

CLINICAL DATA: Seizure

CT HEAD WITHOUT CONTRAST
TECHNIQUE: Contiguous axial images were obtained from the base of
the skull through the vertex without contrast.

[Series 2: head trauma 4.8 h37s · axial · 0.43mm/px · z∈[-157,+30]mm · 16 of 42 slices shown, 20 images]
[im 2/42  brain]
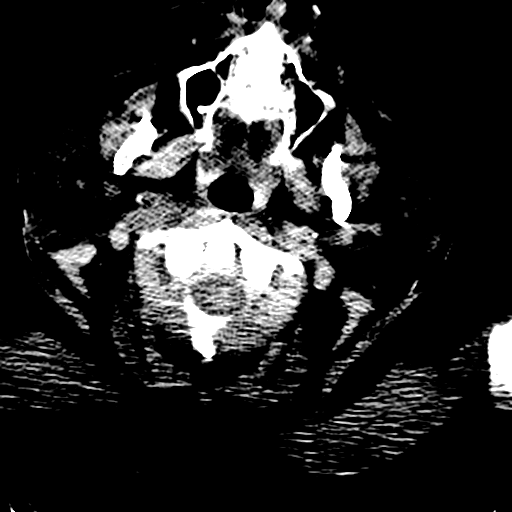
[im 2/42  bone]
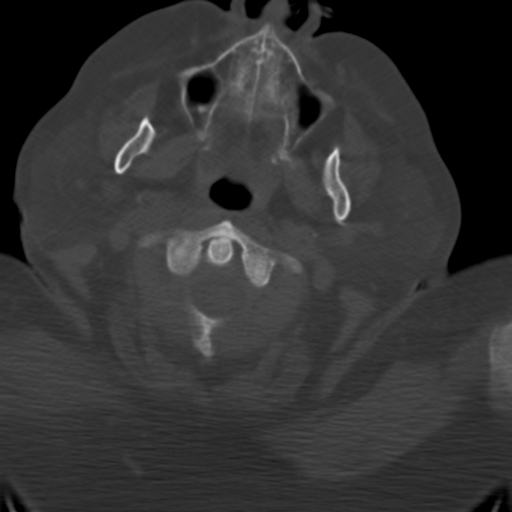
[im 5/42  brain]
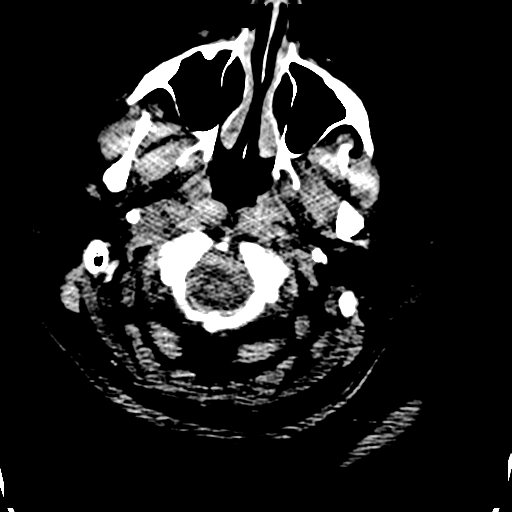
[im 8/42  brain]
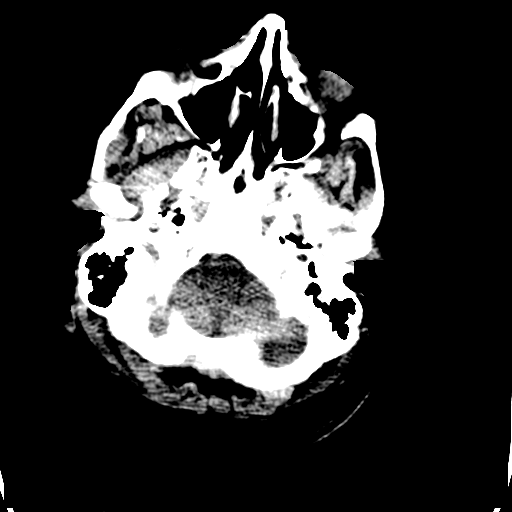
[im 10/42  brain]
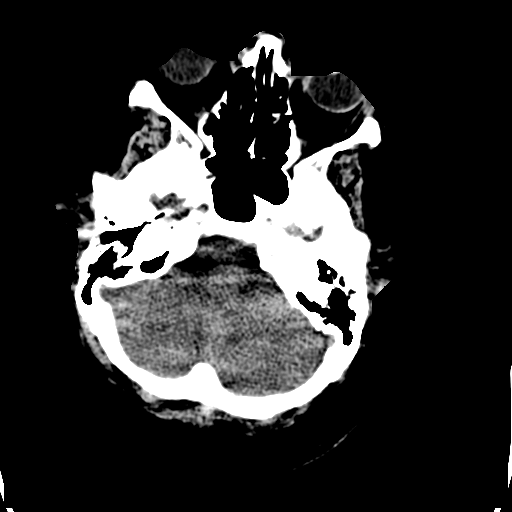
[im 12/42  brain]
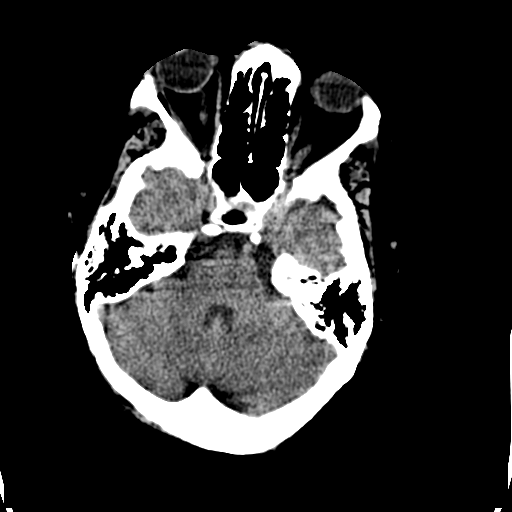
[im 12/42  bone]
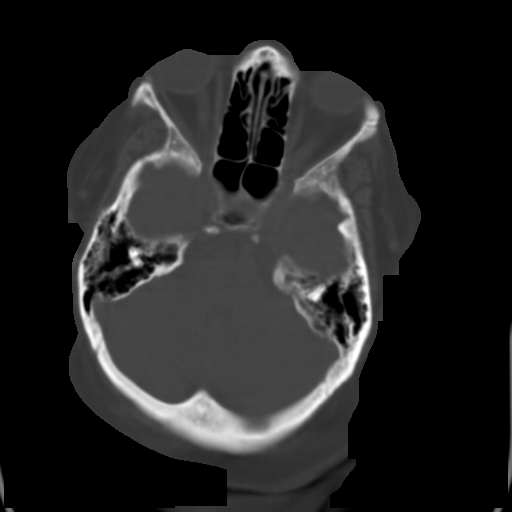
[im 15/42  brain]
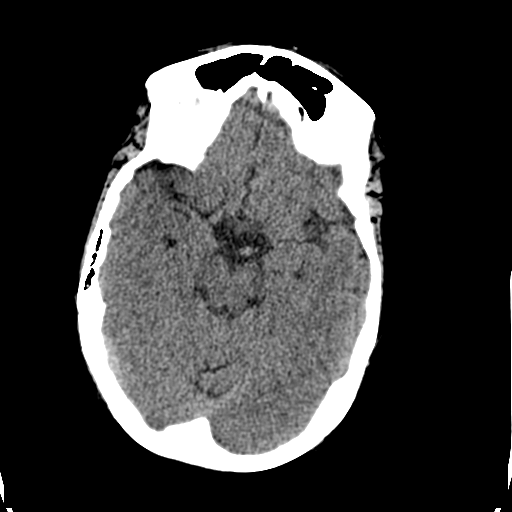
[im 17/42  brain]
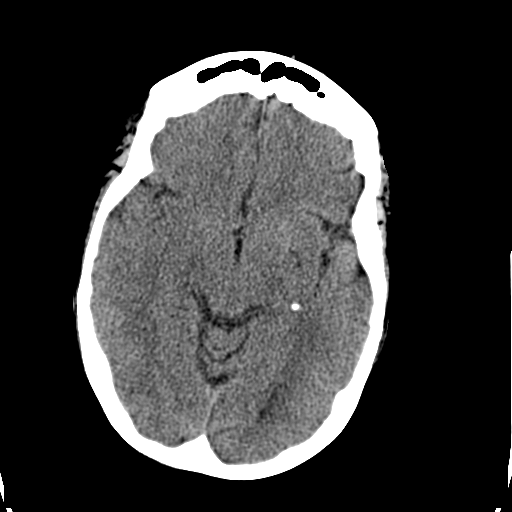
[im 20/42  brain]
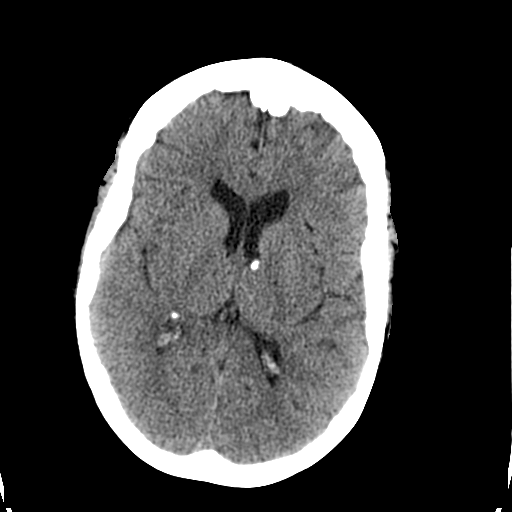
[im 22/42  brain]
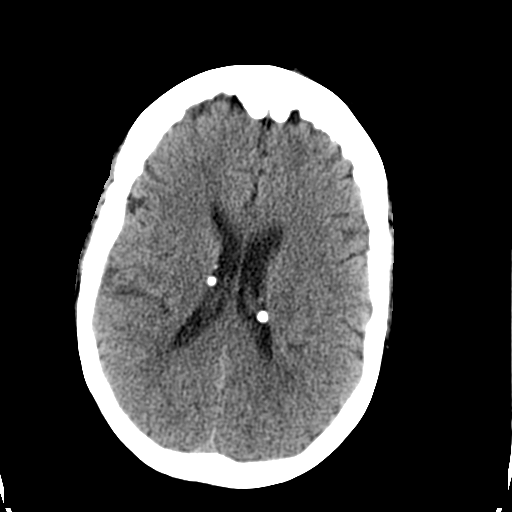
[im 22/42  bone]
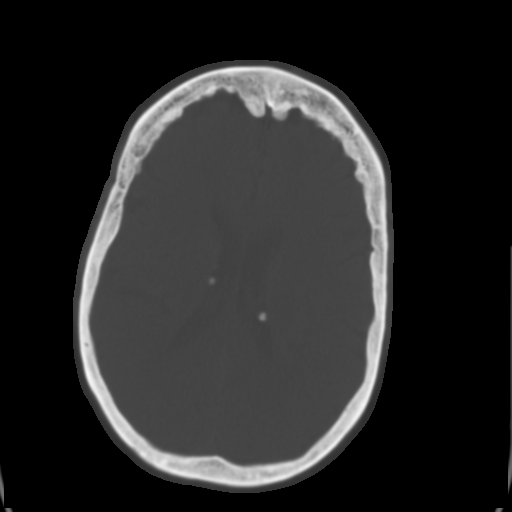
[im 25/42  brain]
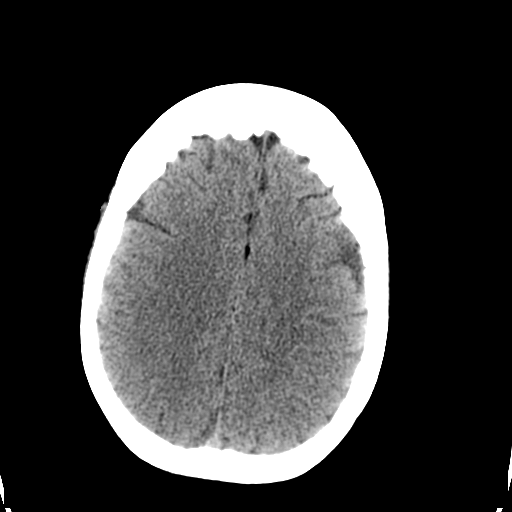
[im 27/42  brain]
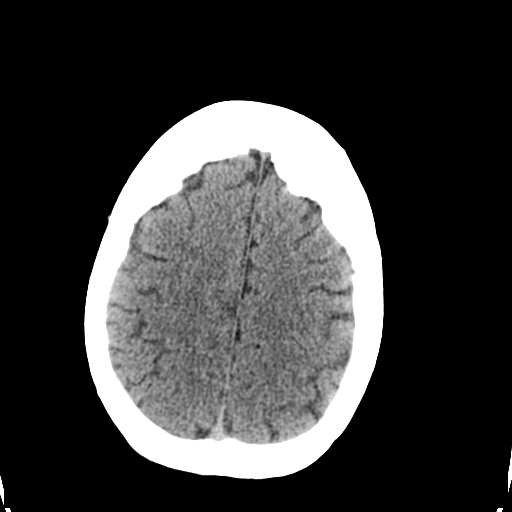
[im 30/42  brain]
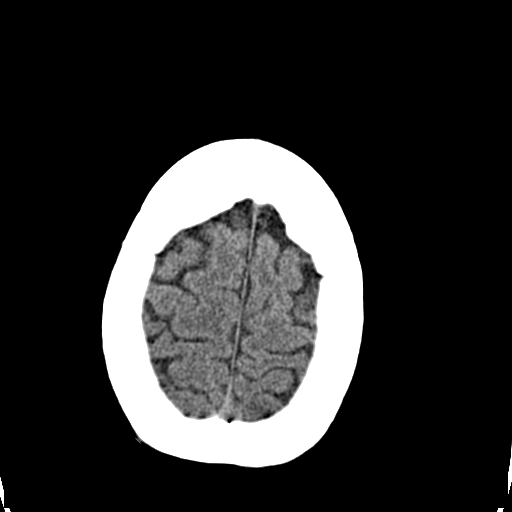
[im 32/42  brain]
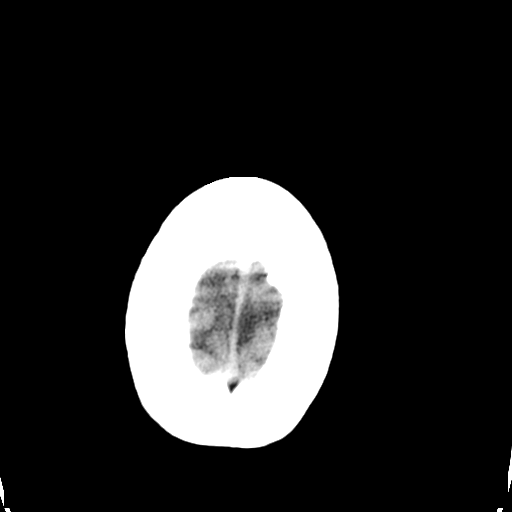
[im 32/42  bone]
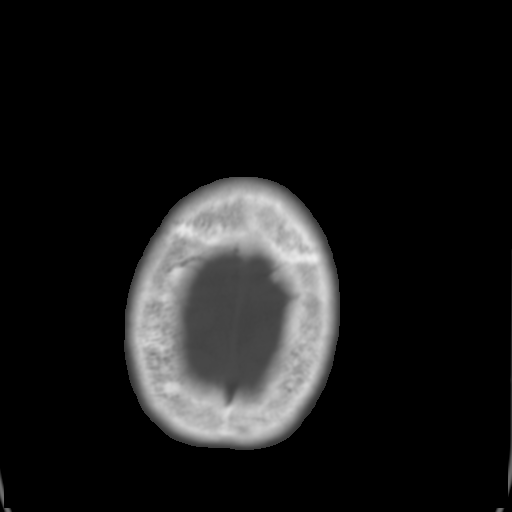
[im 34/42  brain]
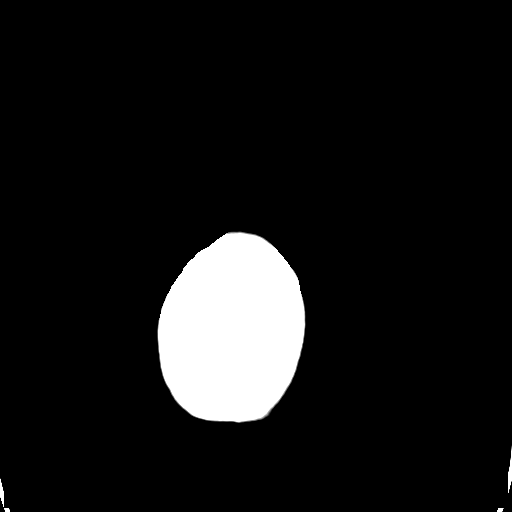
[im 37/42  brain]
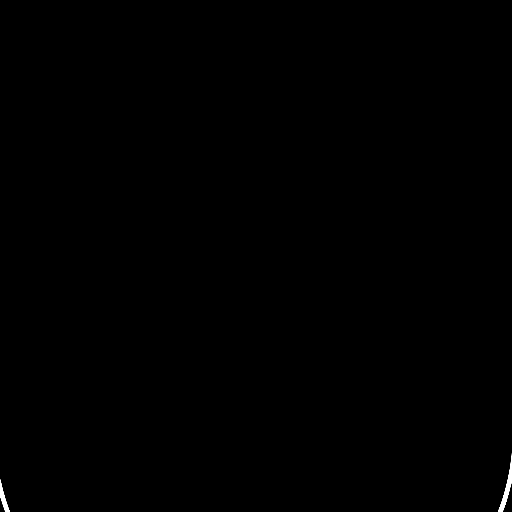
[im 40/42  brain]
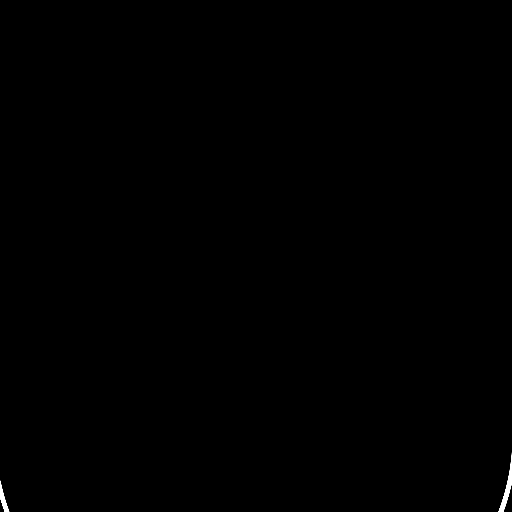

[16 of 30 positions shown; findings below may reference images not displayed]

FINDINGS: Beam hardening artifacts at skull base from shoulders.
Normal ventricular morphology.
No midline shift or mass effect.
Subependymal calcifications at lateral ventricles consistent with
tuberous sclerosis.
No intracranial hemorrhage, mass lesion or evidence of acute
infarction.
No definite extra-axial fluid collections.
Hyperostosis frontalis interna.
Visualized paranasal sinuses and mastoid air cells clear.
IMPRESSION: No acute intracranial abnormalities.
Subependymal calcifications consistent with history of tuberous
sclerosis.

## 2013-08-10 ENCOUNTER — Ambulatory Visit: Payer: Self-pay | Admitting: Vascular Surgery

## 2013-08-10 LAB — CBC
HCT: 27.7 % — ABNORMAL LOW (ref 35.0–47.0)
HGB: 8.6 g/dL — ABNORMAL LOW (ref 12.0–16.0)
MCH: 33.4 pg (ref 26.0–34.0)
MCHC: 31.1 g/dL — ABNORMAL LOW (ref 32.0–36.0)
MCV: 107 fL — AB (ref 80–100)
PLATELETS: 155 10*3/uL (ref 150–440)
RBC: 2.58 10*6/uL — ABNORMAL LOW (ref 3.80–5.20)
RDW: 19.6 % — AB (ref 11.5–14.5)
WBC: 3.8 10*3/uL (ref 3.6–11.0)

## 2013-08-10 LAB — BASIC METABOLIC PANEL
Anion Gap: 5 — ABNORMAL LOW (ref 7–16)
BUN: 63 mg/dL — ABNORMAL HIGH (ref 7–18)
CALCIUM: 10.1 mg/dL (ref 8.5–10.1)
CHLORIDE: 98 mmol/L (ref 98–107)
CO2: 32 mmol/L (ref 21–32)
Creatinine: 7.73 mg/dL — ABNORMAL HIGH (ref 0.60–1.30)
EGFR (Non-African Amer.): 5 — ABNORMAL LOW
GFR CALC AF AMER: 6 — AB
Glucose: 88 mg/dL (ref 65–99)
Osmolality: 287 (ref 275–301)
Potassium: 4 mmol/L (ref 3.5–5.1)
Sodium: 135 mmol/L — ABNORMAL LOW (ref 136–145)

## 2013-08-10 LAB — PROTIME-INR
INR: 1
Prothrombin Time: 13.2 secs (ref 11.5–14.7)

## 2013-08-10 LAB — APTT: Activated PTT: 27.7 secs (ref 23.6–35.9)

## 2013-08-14 ENCOUNTER — Inpatient Hospital Stay: Payer: Self-pay | Admitting: Vascular Surgery

## 2013-08-15 LAB — BASIC METABOLIC PANEL
Anion Gap: 5 — ABNORMAL LOW (ref 7–16)
BUN: 54 mg/dL — ABNORMAL HIGH (ref 7–18)
CALCIUM: 8.7 mg/dL (ref 8.5–10.1)
CHLORIDE: 91 mmol/L — AB (ref 98–107)
Co2: 31 mmol/L (ref 21–32)
Creatinine: 7.7 mg/dL — ABNORMAL HIGH (ref 0.60–1.30)
EGFR (Non-African Amer.): 5 — ABNORMAL LOW
GFR CALC AF AMER: 6 — AB
Glucose: 133 mg/dL — ABNORMAL HIGH (ref 65–99)
OSMOLALITY: 272 (ref 275–301)
Potassium: 4.8 mmol/L (ref 3.5–5.1)
Sodium: 127 mmol/L — ABNORMAL LOW (ref 136–145)

## 2013-08-15 LAB — CBC WITH DIFFERENTIAL/PLATELET
Basophil #: 0 10*3/uL (ref 0.0–0.1)
Basophil %: 0.3 %
EOS ABS: 0 10*3/uL (ref 0.0–0.7)
EOS PCT: 0.2 %
HCT: 25.8 % — AB (ref 35.0–47.0)
HGB: 8.4 g/dL — ABNORMAL LOW (ref 12.0–16.0)
Lymphocyte #: 0.2 10*3/uL — ABNORMAL LOW (ref 1.0–3.6)
Lymphocyte %: 4 %
MCH: 34 pg (ref 26.0–34.0)
MCHC: 32.8 g/dL (ref 32.0–36.0)
MCV: 104 fL — ABNORMAL HIGH (ref 80–100)
MONO ABS: 0.1 x10 3/mm — AB (ref 0.2–0.9)
MONOS PCT: 2.2 %
NEUTROS ABS: 5.1 10*3/uL (ref 1.4–6.5)
Neutrophil %: 93.3 %
PLATELETS: 107 10*3/uL — AB (ref 150–440)
RBC: 2.49 10*6/uL — AB (ref 3.80–5.20)
RDW: 19.1 % — ABNORMAL HIGH (ref 11.5–14.5)
WBC: 5.5 10*3/uL (ref 3.6–11.0)

## 2013-08-15 LAB — PHOSPHORUS: Phosphorus: 3.3 mg/dL (ref 2.5–4.9)

## 2013-08-18 LAB — PHOSPHORUS: PHOSPHORUS: 3.1 mg/dL (ref 2.5–4.9)

## 2013-08-20 LAB — PHOSPHORUS: Phosphorus: 3 mg/dL (ref 2.5–4.9)

## 2013-08-21 LAB — BASIC METABOLIC PANEL
Anion Gap: 3 — ABNORMAL LOW (ref 7–16)
BUN: 40 mg/dL — AB (ref 7–18)
CO2: 30 mmol/L (ref 21–32)
Calcium, Total: 9.1 mg/dL (ref 8.5–10.1)
Chloride: 93 mmol/L — ABNORMAL LOW (ref 98–107)
Creatinine: 5.61 mg/dL — ABNORMAL HIGH (ref 0.60–1.30)
EGFR (African American): 9 — ABNORMAL LOW
EGFR (Non-African Amer.): 8 — ABNORMAL LOW
Glucose: 89 mg/dL (ref 65–99)
OSMOLALITY: 263 (ref 275–301)
POTASSIUM: 5.4 mmol/L — AB (ref 3.5–5.1)
SODIUM: 126 mmol/L — AB (ref 136–145)

## 2013-08-21 LAB — CBC WITH DIFFERENTIAL/PLATELET
BASOS PCT: 0.5 %
Basophil #: 0 10*3/uL (ref 0.0–0.1)
EOS PCT: 1.1 %
Eosinophil #: 0.1 10*3/uL (ref 0.0–0.7)
HCT: 24 % — AB (ref 35.0–47.0)
HGB: 7.9 g/dL — ABNORMAL LOW (ref 12.0–16.0)
LYMPHS PCT: 15.4 %
Lymphocyte #: 0.8 10*3/uL — ABNORMAL LOW (ref 1.0–3.6)
MCH: 34 pg (ref 26.0–34.0)
MCHC: 33 g/dL (ref 32.0–36.0)
MCV: 103 fL — ABNORMAL HIGH (ref 80–100)
Monocyte #: 0.3 x10 3/mm (ref 0.2–0.9)
Monocyte %: 5.5 %
NEUTROS ABS: 3.8 10*3/uL (ref 1.4–6.5)
NEUTROS PCT: 77.5 %
PLATELETS: 120 10*3/uL — AB (ref 150–440)
RBC: 2.32 10*6/uL — ABNORMAL LOW (ref 3.80–5.20)
RDW: 17.6 % — AB (ref 11.5–14.5)
WBC: 4.9 10*3/uL (ref 3.6–11.0)

## 2013-08-21 LAB — POTASSIUM: Potassium: 5.5 mmol/L — ABNORMAL HIGH (ref 3.5–5.1)

## 2013-08-22 LAB — BASIC METABOLIC PANEL
Anion Gap: 5 — ABNORMAL LOW (ref 7–16)
BUN: 54 mg/dL — AB (ref 7–18)
CHLORIDE: 91 mmol/L — AB (ref 98–107)
CREATININE: 6.47 mg/dL — AB (ref 0.60–1.30)
Calcium, Total: 9.2 mg/dL (ref 8.5–10.1)
Co2: 30 mmol/L (ref 21–32)
EGFR (African American): 8 — ABNORMAL LOW
EGFR (Non-African Amer.): 7 — ABNORMAL LOW
Glucose: 113 mg/dL — ABNORMAL HIGH (ref 65–99)
OSMOLALITY: 269 (ref 275–301)
Potassium: 5.8 mmol/L — ABNORMAL HIGH (ref 3.5–5.1)
SODIUM: 126 mmol/L — AB (ref 136–145)

## 2013-08-22 LAB — CBC WITH DIFFERENTIAL/PLATELET
Basophil #: 0 10*3/uL (ref 0.0–0.1)
Basophil %: 0.2 %
Eosinophil #: 0 10*3/uL (ref 0.0–0.7)
Eosinophil %: 0.6 %
HCT: 24.6 % — ABNORMAL LOW (ref 35.0–47.0)
HGB: 8.1 g/dL — AB (ref 12.0–16.0)
LYMPHS PCT: 9.9 %
Lymphocyte #: 0.4 10*3/uL — ABNORMAL LOW (ref 1.0–3.6)
MCH: 33.8 pg (ref 26.0–34.0)
MCHC: 33 g/dL (ref 32.0–36.0)
MCV: 103 fL — AB (ref 80–100)
MONO ABS: 0.2 x10 3/mm (ref 0.2–0.9)
Monocyte %: 4.7 %
NEUTROS PCT: 84.6 %
Neutrophil #: 3.5 10*3/uL (ref 1.4–6.5)
Platelet: 129 10*3/uL — ABNORMAL LOW (ref 150–440)
RBC: 2.4 10*6/uL — AB (ref 3.80–5.20)
RDW: 17.7 % — ABNORMAL HIGH (ref 11.5–14.5)
WBC: 4.2 10*3/uL (ref 3.6–11.0)

## 2013-08-22 LAB — PHOSPHORUS: Phosphorus: 2.8 mg/dL (ref 2.5–4.9)

## 2013-08-24 LAB — BASIC METABOLIC PANEL
Anion Gap: 5 — ABNORMAL LOW (ref 7–16)
BUN: 50 mg/dL — ABNORMAL HIGH (ref 7–18)
CHLORIDE: 91 mmol/L — AB (ref 98–107)
Calcium, Total: 9.7 mg/dL (ref 8.5–10.1)
Co2: 30 mmol/L (ref 21–32)
Creatinine: 6.37 mg/dL — ABNORMAL HIGH (ref 0.60–1.30)
EGFR (African American): 8 — ABNORMAL LOW
EGFR (Non-African Amer.): 7 — ABNORMAL LOW
GLUCOSE: 88 mg/dL (ref 65–99)
Osmolality: 266 (ref 275–301)
Potassium: 4.4 mmol/L (ref 3.5–5.1)
SODIUM: 126 mmol/L — AB (ref 136–145)

## 2013-08-25 LAB — POTASSIUM: POTASSIUM: 5.1 mmol/L (ref 3.5–5.1)

## 2013-08-25 LAB — PHOSPHORUS: PHOSPHORUS: 3.4 mg/dL (ref 2.5–4.9)

## 2013-08-25 LAB — HEMOGLOBIN: HGB: 8.3 g/dL — ABNORMAL LOW (ref 12.0–16.0)

## 2013-09-09 ENCOUNTER — Inpatient Hospital Stay: Payer: Self-pay | Admitting: Psychiatry

## 2013-09-09 LAB — COMPREHENSIVE METABOLIC PANEL
AST: 13 U/L — AB (ref 15–37)
Albumin: 3.1 g/dL — ABNORMAL LOW (ref 3.4–5.0)
Alkaline Phosphatase: 130 U/L — ABNORMAL HIGH
Anion Gap: 4 — ABNORMAL LOW (ref 7–16)
BUN: 30 mg/dL — ABNORMAL HIGH (ref 7–18)
Bilirubin,Total: 0.4 mg/dL (ref 0.2–1.0)
CHLORIDE: 97 mmol/L — AB (ref 98–107)
CO2: 31 mmol/L (ref 21–32)
CREATININE: 5.31 mg/dL — AB (ref 0.60–1.30)
Calcium, Total: 9.1 mg/dL (ref 8.5–10.1)
EGFR (African American): 10 — ABNORMAL LOW
EGFR (Non-African Amer.): 8 — ABNORMAL LOW
Glucose: 74 mg/dL (ref 65–99)
OSMOLALITY: 269 (ref 275–301)
Potassium: 4.3 mmol/L (ref 3.5–5.1)
SGPT (ALT): 13 U/L (ref 12–78)
Sodium: 132 mmol/L — ABNORMAL LOW (ref 136–145)
TOTAL PROTEIN: 6.9 g/dL (ref 6.4–8.2)

## 2013-09-09 LAB — CBC
HCT: 20.4 % — AB (ref 35.0–47.0)
HGB: 6.5 g/dL — ABNORMAL LOW (ref 12.0–16.0)
MCH: 32.1 pg (ref 26.0–34.0)
MCHC: 31.8 g/dL — AB (ref 32.0–36.0)
MCV: 101 fL — AB (ref 80–100)
Platelet: 175 10*3/uL (ref 150–440)
RBC: 2.02 10*6/uL — ABNORMAL LOW (ref 3.80–5.20)
RDW: 17.4 % — ABNORMAL HIGH (ref 11.5–14.5)
WBC: 4.5 10*3/uL (ref 3.6–11.0)

## 2013-09-09 LAB — CK TOTAL AND CKMB (NOT AT ARMC)
CK, Total: 13 U/L — ABNORMAL LOW
CK-MB: <0.5 ng/mL — ABNORMAL LOW (ref 0.5–3.6)

## 2013-09-09 LAB — TROPONIN I

## 2013-09-09 LAB — CLOSTRIDIUM DIFFICILE(ARMC)

## 2013-09-09 LAB — OCCULT BLOOD X 1 CARD TO LAB, STOOL: Occult Blood, Feces: NEGATIVE

## 2013-09-10 LAB — CBC WITH DIFFERENTIAL/PLATELET
Basophil #: 0 10*3/uL (ref 0.0–0.1)
Basophil %: 0.6 %
Eosinophil #: 0 10*3/uL (ref 0.0–0.7)
Eosinophil %: 1 %
HCT: 22.4 % — AB (ref 35.0–47.0)
HGB: 7.4 g/dL — ABNORMAL LOW (ref 12.0–16.0)
LYMPHS ABS: 0.7 10*3/uL — AB (ref 1.0–3.6)
Lymphocyte %: 17.9 %
MCH: 32.7 pg (ref 26.0–34.0)
MCHC: 32.9 g/dL (ref 32.0–36.0)
MCV: 100 fL (ref 80–100)
MONOS PCT: 8.4 %
Monocyte #: 0.4 x10 3/mm (ref 0.2–0.9)
NEUTROS PCT: 72.1 %
Neutrophil #: 3 10*3/uL (ref 1.4–6.5)
Platelet: 161 10*3/uL (ref 150–440)
RBC: 2.25 10*6/uL — ABNORMAL LOW (ref 3.80–5.20)
RDW: 17.3 % — ABNORMAL HIGH (ref 11.5–14.5)
WBC: 4.2 10*3/uL (ref 3.6–11.0)

## 2013-09-10 LAB — COMPREHENSIVE METABOLIC PANEL
ALBUMIN: 2.7 g/dL — AB (ref 3.4–5.0)
ALK PHOS: 121 U/L — AB
ALT: 12 U/L (ref 12–78)
ANION GAP: 6 — AB (ref 7–16)
AST: 13 U/L — AB (ref 15–37)
BILIRUBIN TOTAL: 0.5 mg/dL (ref 0.2–1.0)
BUN: 33 mg/dL — ABNORMAL HIGH (ref 7–18)
CHLORIDE: 101 mmol/L (ref 98–107)
Calcium, Total: 8.4 mg/dL — ABNORMAL LOW (ref 8.5–10.1)
Co2: 27 mmol/L (ref 21–32)
Creatinine: 5.95 mg/dL — ABNORMAL HIGH (ref 0.60–1.30)
EGFR (Non-African Amer.): 7 — ABNORMAL LOW
GFR CALC AF AMER: 9 — AB
Glucose: 81 mg/dL (ref 65–99)
Osmolality: 275 (ref 275–301)
POTASSIUM: 4.4 mmol/L (ref 3.5–5.1)
Sodium: 134 mmol/L — ABNORMAL LOW (ref 136–145)
Total Protein: 6.1 g/dL — ABNORMAL LOW (ref 6.4–8.2)

## 2013-09-10 LAB — MAGNESIUM: Magnesium: 2 mg/dL

## 2013-09-11 LAB — CBC WITH DIFFERENTIAL/PLATELET
BASOS PCT: 0.8 %
BASOS PCT: 0.7 %
Basophil #: 0 10*3/uL (ref 0.0–0.1)
Basophil #: 0 10*3/uL (ref 0.0–0.1)
EOS ABS: 0.1 10*3/uL (ref 0.0–0.7)
Eosinophil #: 0.1 10*3/uL (ref 0.0–0.7)
Eosinophil %: 3 %
Eosinophil %: 3.1 %
HCT: 23 % — AB (ref 35.0–47.0)
HCT: 24.6 % — AB (ref 35.0–47.0)
HGB: 7.4 g/dL — ABNORMAL LOW (ref 12.0–16.0)
HGB: 7.4 g/dL — ABNORMAL LOW (ref 12.0–16.0)
LYMPHS PCT: 16.1 %
Lymphocyte #: 0.8 10*3/uL — ABNORMAL LOW (ref 1.0–3.6)
Lymphocyte #: 0.6 10*3/uL — ABNORMAL LOW (ref 1.0–3.6)
Lymphocyte %: 20.8 %
MCH: 32.2 pg (ref 26.0–34.0)
MCH: 30.2 pg (ref 26.0–34.0)
MCHC: 32 g/dL (ref 32.0–36.0)
MCHC: 30 g/dL — ABNORMAL LOW (ref 32.0–36.0)
MCV: 101 fL — ABNORMAL HIGH (ref 80–100)
MCV: 101 fL — ABNORMAL HIGH (ref 80–100)
MONOS PCT: 4.3 %
Monocyte #: 0.3 x10 3/mm (ref 0.2–0.9)
Monocyte #: 0.2 x10 3/mm (ref 0.2–0.9)
Monocyte %: 6.8 %
NEUTROS ABS: 2.8 10*3/uL (ref 1.4–6.5)
NEUTROS PCT: 68.6 %
Neutrophil #: 2.5 10*3/uL (ref 1.4–6.5)
Neutrophil %: 75.8 %
Platelet: 164 10*3/uL (ref 150–440)
Platelet: 169 10*3/uL (ref 150–440)
RBC: 2.29 10*6/uL — ABNORMAL LOW (ref 3.80–5.20)
RBC: 2.45 10*6/uL — ABNORMAL LOW (ref 3.80–5.20)
RDW: 17.1 % — ABNORMAL HIGH (ref 11.5–14.5)
RDW: 17.1 % — ABNORMAL HIGH (ref 11.5–14.5)
WBC: 3.7 10*3/uL (ref 3.6–11.0)
WBC: 3.7 10*3/uL (ref 3.6–11.0)

## 2013-09-11 LAB — BASIC METABOLIC PANEL
ANION GAP: 5 — AB (ref 7–16)
BUN: 43 mg/dL — AB (ref 7–18)
CHLORIDE: 102 mmol/L (ref 98–107)
CO2: 25 mmol/L (ref 21–32)
Calcium, Total: 8.7 mg/dL (ref 8.5–10.1)
Creatinine: 7.05 mg/dL — ABNORMAL HIGH (ref 0.60–1.30)
EGFR (African American): 7 — ABNORMAL LOW
EGFR (Non-African Amer.): 6 — ABNORMAL LOW
GLUCOSE: 78 mg/dL (ref 65–99)
Osmolality: 274 (ref 275–301)
Potassium: 4.5 mmol/L (ref 3.5–5.1)
SODIUM: 132 mmol/L — AB (ref 136–145)

## 2013-09-11 LAB — PHOSPHORUS: Phosphorus: 2.4 mg/dL — ABNORMAL LOW (ref 2.5–4.9)

## 2013-09-11 LAB — POTASSIUM: POTASSIUM: 4.4 mmol/L (ref 3.5–5.1)

## 2013-09-12 LAB — CBC WITH DIFFERENTIAL/PLATELET
Basophil #: 0 10*3/uL (ref 0.0–0.1)
Basophil %: 0.4 %
Eosinophil #: 0.1 10*3/uL (ref 0.0–0.7)
Eosinophil %: 0.9 %
HCT: 21.1 % — ABNORMAL LOW (ref 35.0–47.0)
HGB: 6.9 g/dL — AB (ref 12.0–16.0)
LYMPHS ABS: 0.6 10*3/uL — AB (ref 1.0–3.6)
Lymphocyte %: 8.4 %
MCH: 32.8 pg (ref 26.0–34.0)
MCHC: 32.7 g/dL (ref 32.0–36.0)
MCV: 100 fL (ref 80–100)
Monocyte #: 0.4 x10 3/mm (ref 0.2–0.9)
Monocyte %: 5.1 %
NEUTROS PCT: 85.2 %
Neutrophil #: 6.2 10*3/uL (ref 1.4–6.5)
Platelet: 139 10*3/uL — ABNORMAL LOW (ref 150–440)
RBC: 2.11 10*6/uL — ABNORMAL LOW (ref 3.80–5.20)
RDW: 16.9 % — ABNORMAL HIGH (ref 11.5–14.5)
WBC: 7.3 10*3/uL (ref 3.6–11.0)

## 2013-09-12 LAB — BASIC METABOLIC PANEL
Anion Gap: 5 — ABNORMAL LOW (ref 7–16)
BUN: 30 mg/dL — AB (ref 7–18)
CALCIUM: 8.5 mg/dL (ref 8.5–10.1)
CREATININE: 5.15 mg/dL — AB (ref 0.60–1.30)
Chloride: 99 mmol/L (ref 98–107)
Co2: 29 mmol/L (ref 21–32)
EGFR (African American): 10 — ABNORMAL LOW
EGFR (Non-African Amer.): 9 — ABNORMAL LOW
Glucose: 79 mg/dL (ref 65–99)
Osmolality: 271 (ref 275–301)
Potassium: 4.2 mmol/L (ref 3.5–5.1)
SODIUM: 133 mmol/L — AB (ref 136–145)

## 2013-09-12 LAB — OCCULT BLOOD X 1 CARD TO LAB, STOOL: OCCULT BLOOD, FECES: POSITIVE

## 2013-09-13 LAB — CBC WITH DIFFERENTIAL/PLATELET
BASOS PCT: 0.6 %
Basophil #: 0 10*3/uL (ref 0.0–0.1)
EOS ABS: 0.1 10*3/uL (ref 0.0–0.7)
EOS PCT: 2.4 %
HCT: 24 % — ABNORMAL LOW (ref 35.0–47.0)
HGB: 7.5 g/dL — ABNORMAL LOW (ref 12.0–16.0)
LYMPHS ABS: 0.8 10*3/uL — AB (ref 1.0–3.6)
Lymphocyte %: 16.9 %
MCH: 30.9 pg (ref 26.0–34.0)
MCHC: 31.4 g/dL — AB (ref 32.0–36.0)
MCV: 98 fL (ref 80–100)
MONOS PCT: 7 %
Monocyte #: 0.3 x10 3/mm (ref 0.2–0.9)
NEUTROS ABS: 3.3 10*3/uL (ref 1.4–6.5)
Neutrophil %: 73.1 %
PLATELETS: 143 10*3/uL — AB (ref 150–440)
RBC: 2.43 10*6/uL — ABNORMAL LOW (ref 3.80–5.20)
RDW: 16.7 % — ABNORMAL HIGH (ref 11.5–14.5)
WBC: 4.4 10*3/uL (ref 3.6–11.0)

## 2013-09-14 LAB — HEMOGLOBIN: HGB: 7.7 g/dL — AB (ref 12.0–16.0)

## 2013-09-15 LAB — CBC WITH DIFFERENTIAL/PLATELET
Basophil #: 0 10*3/uL (ref 0.0–0.1)
Basophil %: 0.9 %
EOS PCT: 2.9 %
Eosinophil #: 0.1 10*3/uL (ref 0.0–0.7)
HCT: 23.9 % — ABNORMAL LOW (ref 35.0–47.0)
HGB: 7.9 g/dL — AB (ref 12.0–16.0)
LYMPHS ABS: 0.5 10*3/uL — AB (ref 1.0–3.6)
Lymphocyte %: 13.5 %
MCH: 32.5 pg (ref 26.0–34.0)
MCHC: 33 g/dL (ref 32.0–36.0)
MCV: 98 fL (ref 80–100)
Monocyte #: 0.2 x10 3/mm (ref 0.2–0.9)
Monocyte %: 4.5 %
Neutrophil #: 3.1 10*3/uL (ref 1.4–6.5)
Neutrophil %: 78.2 %
PLATELETS: 177 10*3/uL (ref 150–440)
RBC: 2.43 10*6/uL — AB (ref 3.80–5.20)
RDW: 16.8 % — AB (ref 11.5–14.5)
WBC: 3.9 10*3/uL (ref 3.6–11.0)

## 2013-09-15 LAB — CULTURE, BLOOD (SINGLE)

## 2013-09-15 LAB — PHOSPHORUS: Phosphorus: 1.7 mg/dL — ABNORMAL LOW (ref 2.5–4.9)

## 2013-09-15 LAB — POTASSIUM: Potassium: 4.4 mmol/L (ref 3.5–5.1)

## 2013-09-30 ENCOUNTER — Ambulatory Visit: Payer: Medicare Other | Admitting: Licensed Clinical Social Worker

## 2013-10-05 ENCOUNTER — Ambulatory Visit: Payer: Medicare Other | Admitting: Licensed Clinical Social Worker

## 2013-10-14 ENCOUNTER — Ambulatory Visit (INDEPENDENT_AMBULATORY_CARE_PROVIDER_SITE_OTHER): Payer: Medicare Other | Admitting: Licensed Clinical Social Worker

## 2013-10-14 DIAGNOSIS — F4320 Adjustment disorder, unspecified: Secondary | ICD-10-CM

## 2013-10-15 ENCOUNTER — Emergency Department: Payer: Self-pay | Admitting: Emergency Medicine

## 2013-10-20 ENCOUNTER — Emergency Department: Payer: Self-pay | Admitting: Emergency Medicine

## 2013-10-20 LAB — CBC
HCT: 32.2 % — ABNORMAL LOW (ref 35.0–47.0)
HGB: 10.4 g/dL — ABNORMAL LOW (ref 12.0–16.0)
MCH: 32.7 pg (ref 26.0–34.0)
MCHC: 32.3 g/dL (ref 32.0–36.0)
MCV: 101 fL — ABNORMAL HIGH (ref 80–100)
Platelet: 138 10*3/uL — ABNORMAL LOW (ref 150–440)
RBC: 3.18 10*6/uL — AB (ref 3.80–5.20)
RDW: 16.8 % — AB (ref 11.5–14.5)
WBC: 4 10*3/uL (ref 3.6–11.0)

## 2013-10-20 LAB — COMPREHENSIVE METABOLIC PANEL
ALK PHOS: 134 U/L — AB
ANION GAP: 3 — AB (ref 7–16)
Albumin: 3.4 g/dL (ref 3.4–5.0)
BILIRUBIN TOTAL: 0.3 mg/dL (ref 0.2–1.0)
BUN: 33 mg/dL — ABNORMAL HIGH (ref 7–18)
CALCIUM: 8.5 mg/dL (ref 8.5–10.1)
Chloride: 102 mmol/L (ref 98–107)
Co2: 33 mmol/L — ABNORMAL HIGH (ref 21–32)
Creatinine: 4.87 mg/dL — ABNORMAL HIGH (ref 0.60–1.30)
GFR CALC AF AMER: 11 — AB
GFR CALC NON AF AMER: 9 — AB
Glucose: 85 mg/dL (ref 65–99)
OSMOLALITY: 282 (ref 275–301)
POTASSIUM: 3.8 mmol/L (ref 3.5–5.1)
SGOT(AST): 12 U/L — ABNORMAL LOW (ref 15–37)
SGPT (ALT): 15 U/L (ref 12–78)
Sodium: 138 mmol/L (ref 136–145)
Total Protein: 7 g/dL (ref 6.4–8.2)

## 2013-10-20 LAB — CK TOTAL AND CKMB (NOT AT ARMC)
CK, Total: 15 U/L — ABNORMAL LOW
CK-MB: <0.5 ng/mL — ABNORMAL LOW (ref 0.5–3.6)

## 2013-10-20 LAB — TROPONIN I: Troponin-I: <0.02 ng/mL

## 2013-10-20 LAB — MAGNESIUM: Magnesium: 2 mg/dL

## 2013-10-30 ENCOUNTER — Ambulatory Visit (INDEPENDENT_AMBULATORY_CARE_PROVIDER_SITE_OTHER): Payer: Medicare Other | Admitting: Licensed Clinical Social Worker

## 2013-10-30 DIAGNOSIS — F4320 Adjustment disorder, unspecified: Secondary | ICD-10-CM

## 2013-11-01 ENCOUNTER — Other Ambulatory Visit: Payer: Self-pay

## 2013-11-01 LAB — PROTIME-INR
INR: 1.1
Prothrombin Time: 13.8 s

## 2013-11-02 ENCOUNTER — Ambulatory Visit: Payer: Self-pay | Admitting: Vascular Surgery

## 2013-11-04 IMAGING — CR DG CHEST 1V PORT
1 series · 1 of 1 positions shown · non-contrast
Comparison: none

REASON FOR EXAM: hypoxia
COMMENTS:

[ap]
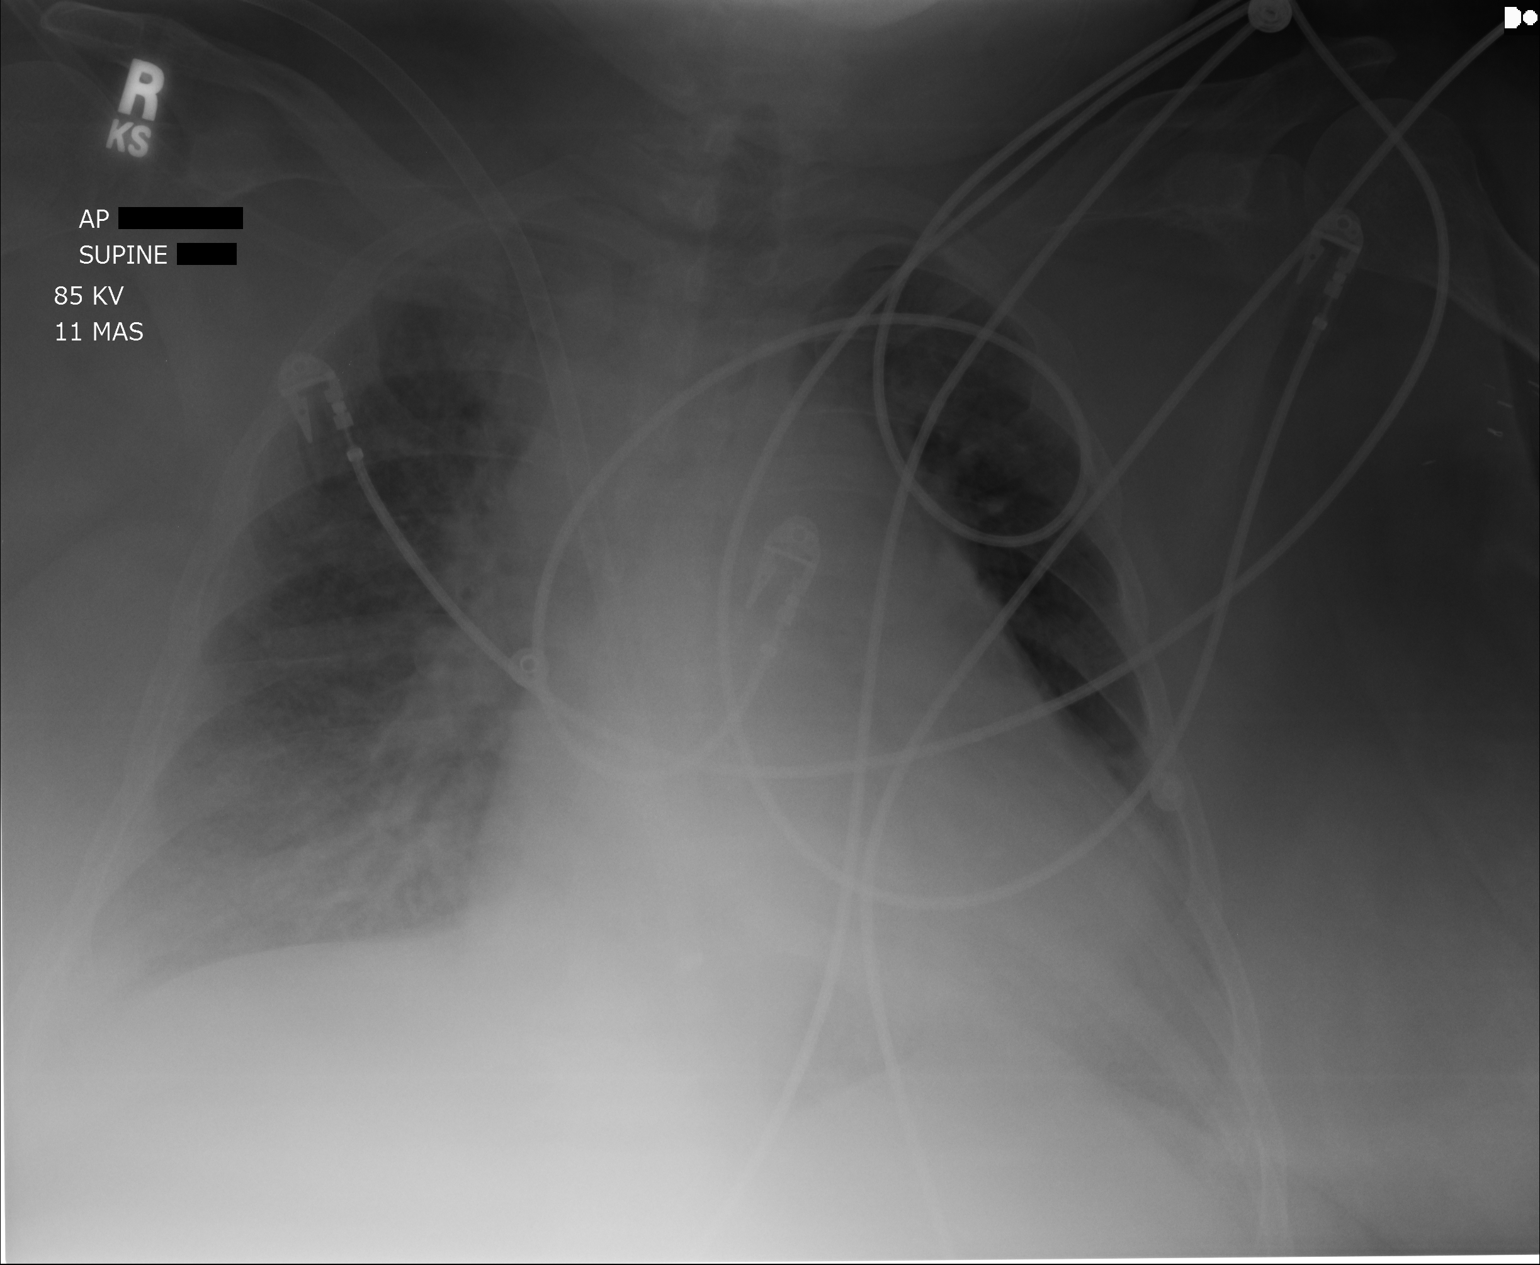

[1 of 1 positions shown; findings below may reference images not displayed]

PROCEDURE:     DXR - DXR PORTABLE CHEST SINGLE VIEW  - November 04, 2011 [DATE]

RESULT:     Comparison is made to prior study dated 11/02/2011.

A right-sided central venous catheter is appreciated with the tip projecting
region of the right atrium.

The patient has taken a shallow inspiration. With technique taken into
consideration, there is thickening of the interstitial markings and
peribronchial cuffing. There is mild blunting of the left costophrenic
angle. The cardiac silhouette is enlarged. The visualized bony skeleton is
grossly unremarkable.
IMPRESSION: Interstitial infiltrate likely representing a component of pulmonary edema.
Surveillance evaluation is recommended status post appropriate therapeutic
regimen.

## 2013-11-05 ENCOUNTER — Other Ambulatory Visit: Payer: Self-pay | Admitting: Family Medicine

## 2013-11-05 LAB — PROTIME-INR
INR: 1.1
Prothrombin Time: 14.4 secs (ref 11.5–14.7)

## 2013-11-16 ENCOUNTER — Ambulatory Visit (INDEPENDENT_AMBULATORY_CARE_PROVIDER_SITE_OTHER): Payer: Medicare Other | Admitting: Licensed Clinical Social Worker

## 2013-11-16 DIAGNOSIS — F4320 Adjustment disorder, unspecified: Secondary | ICD-10-CM

## 2013-11-18 ENCOUNTER — Ambulatory Visit: Payer: Self-pay | Admitting: Physician Assistant

## 2013-11-26 ENCOUNTER — Ambulatory Visit: Payer: Medicare Other | Admitting: Licensed Clinical Social Worker

## 2013-12-01 ENCOUNTER — Other Ambulatory Visit: Payer: Self-pay | Admitting: Family Medicine

## 2013-12-01 LAB — PROTIME-INR
INR: 1.4
PROTHROMBIN TIME: 16.7 s — AB (ref 11.5–14.7)

## 2013-12-14 IMAGING — XA IR VASCULAR PROCEDURE
6 series · 12 of 12 positions shown · IV contrast (IODINE)
Comparison: none

[Series 1: care upper arm · 2 of 2 slices shown (1 of 6)]
[im 1/2]
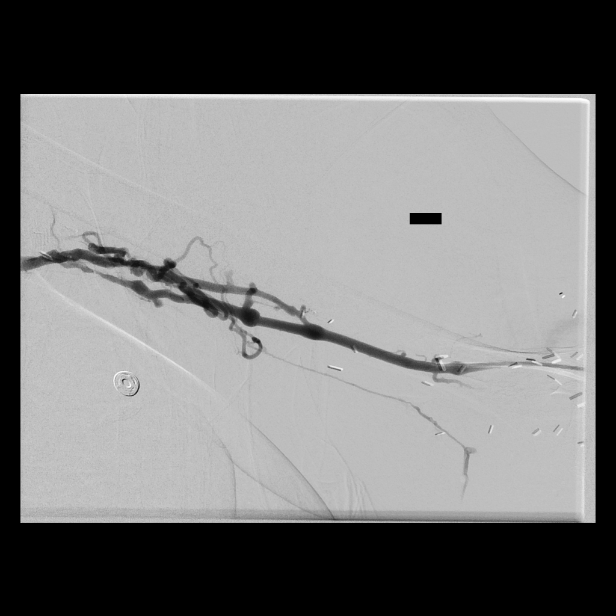
[im 2/2]
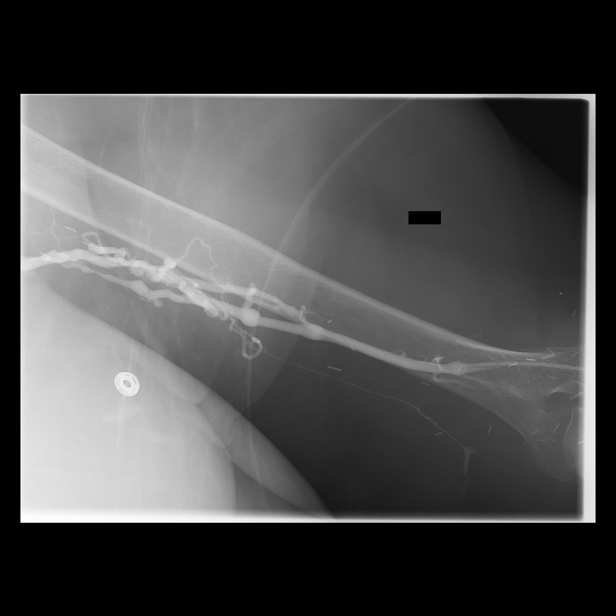

[Series 2: care upper arm · 2 of 2 slices shown (2 of 6)]
[im 1/2]
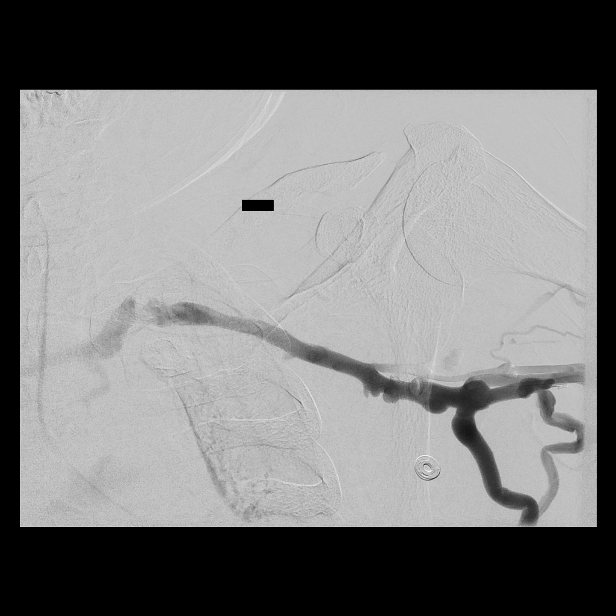
[im 2/2]
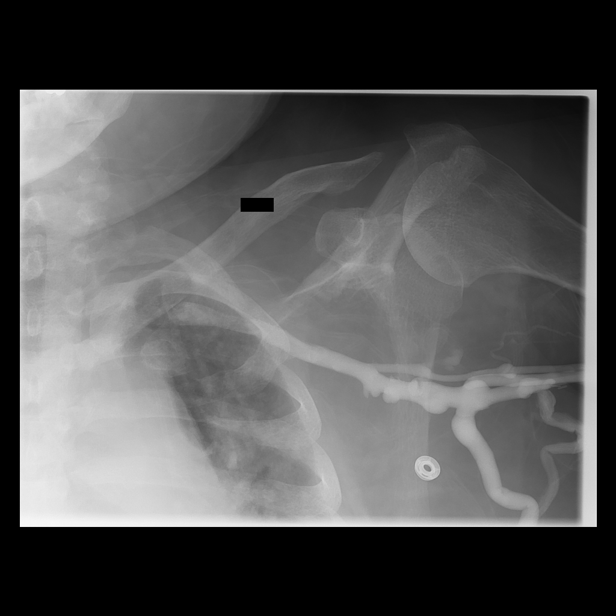

[Series 3: care upper arm · 2 of 2 slices shown (3 of 6)]
[im 1/2]
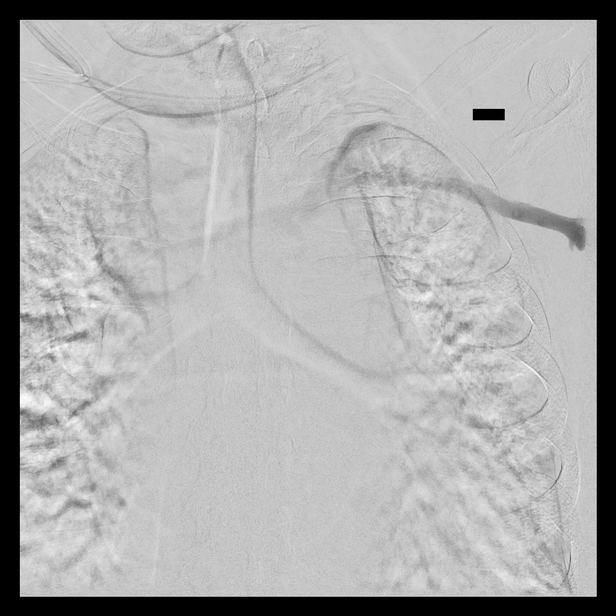
[im 2/2]
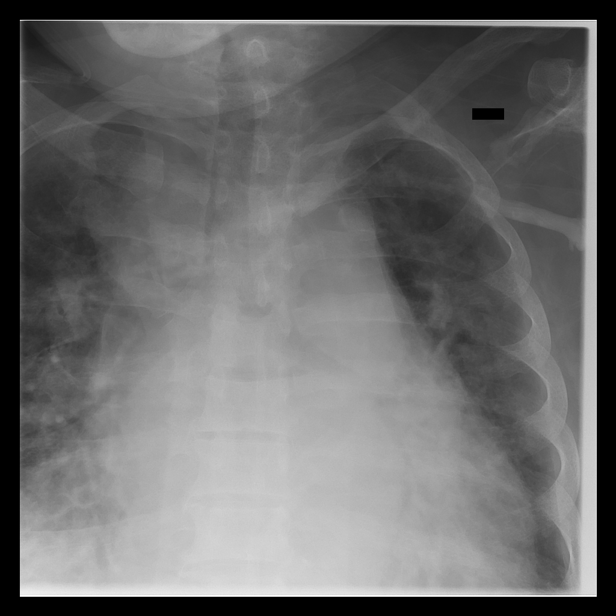

[Series 4: care upper arm · 2 of 2 slices shown (4 of 6)]
[im 1/2]
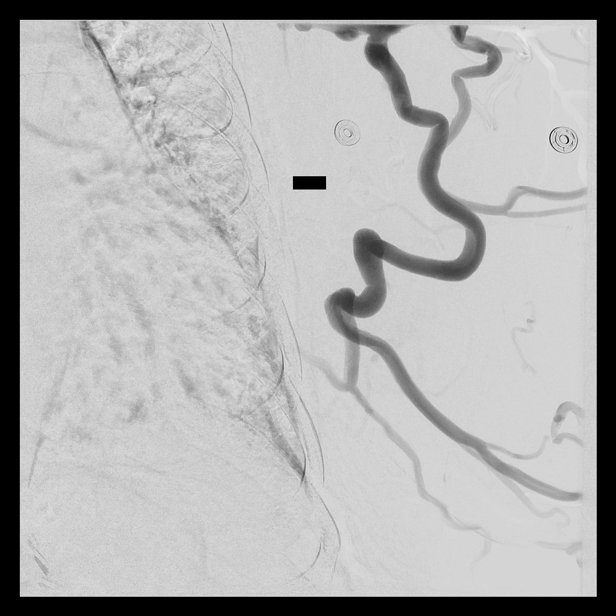
[im 2/2]
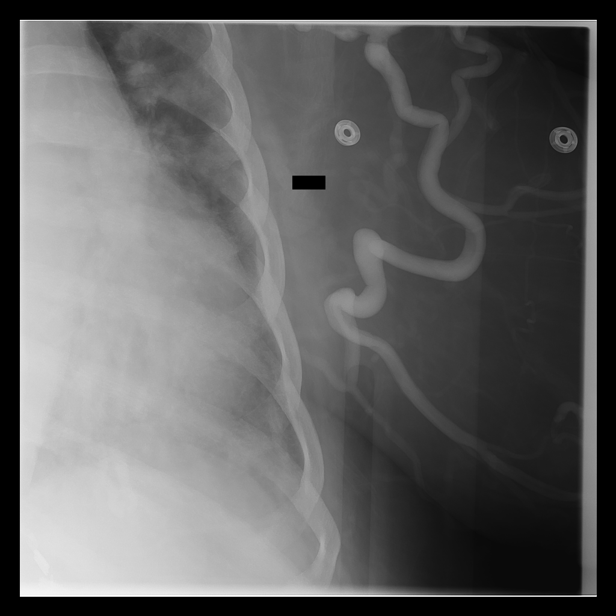

[Series 5: care upper arm · 2 of 2 slices shown (5 of 6)]
[im 1/2]
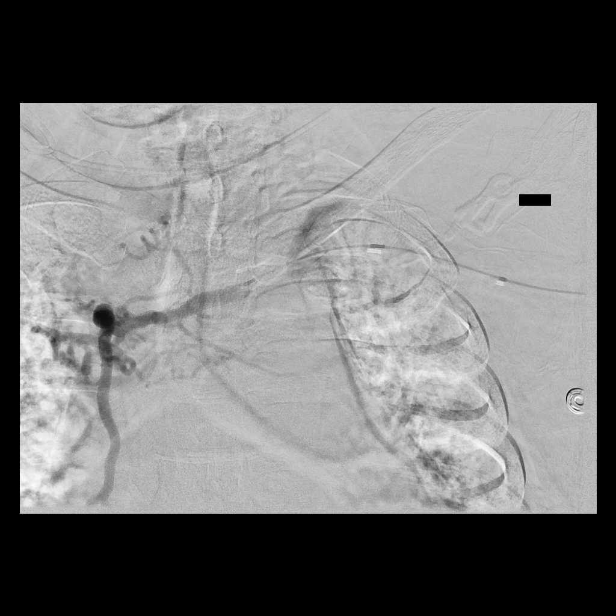
[im 2/2]
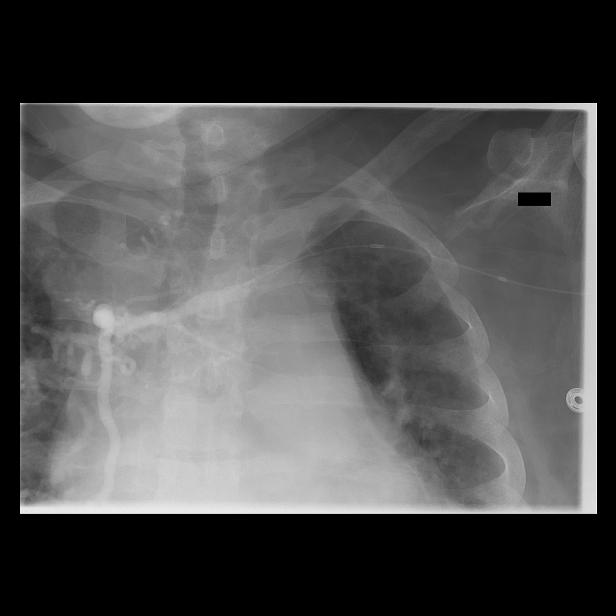

[Series 6: care upper arm · 2 of 2 slices shown (6 of 6)]
[im 1/2]
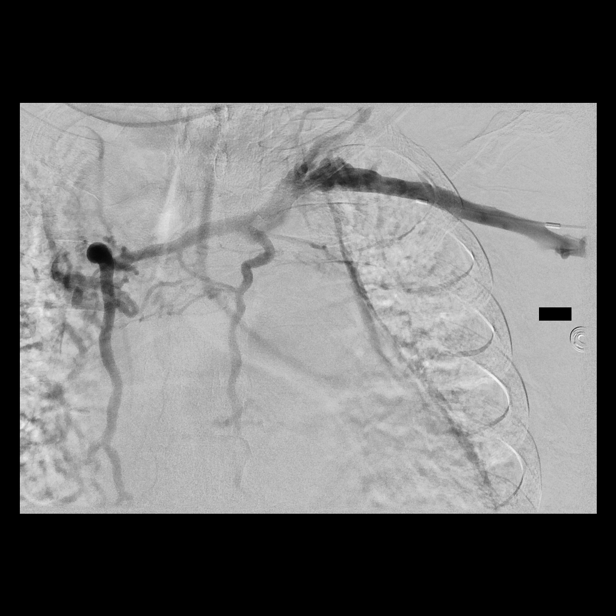
[im 2/2]
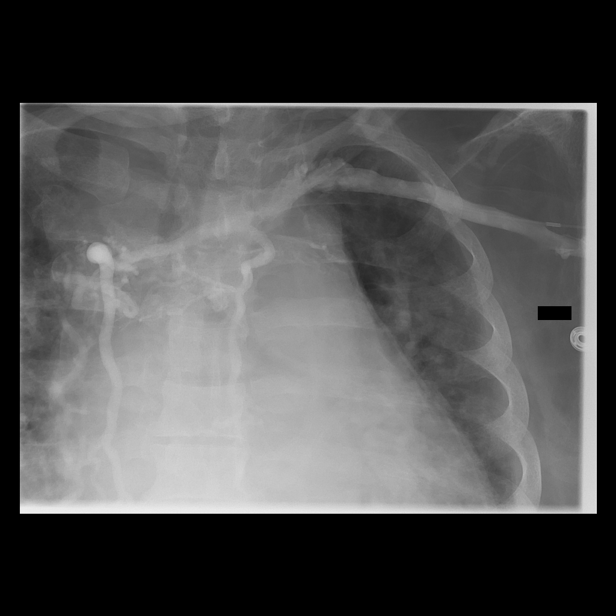

[12 of 12 positions shown; findings below may reference images not displayed]

IMAGES IMPORTED FROM THE [employer] WORKFLOW SYSTEM
NO DICTATION FOR STUDY

## 2013-12-18 ENCOUNTER — Inpatient Hospital Stay: Payer: Self-pay | Admitting: Internal Medicine

## 2013-12-18 LAB — CBC WITH DIFFERENTIAL/PLATELET
BASOS ABS: 0 10*3/uL (ref 0.0–0.1)
Basophil %: 0.4 %
Eosinophil #: 0 10*3/uL (ref 0.0–0.7)
Eosinophil %: 0.2 %
HCT: 43.3 % (ref 35.0–47.0)
HGB: 13.6 g/dL (ref 12.0–16.0)
LYMPHS ABS: 0.4 10*3/uL — AB (ref 1.0–3.6)
LYMPHS PCT: 9.6 %
MCH: 32 pg (ref 26.0–34.0)
MCHC: 31.3 g/dL — AB (ref 32.0–36.0)
MCV: 102 fL — ABNORMAL HIGH (ref 80–100)
MONOS PCT: 1.1 %
Monocyte #: 0 x10 3/mm — ABNORMAL LOW (ref 0.2–0.9)
Neutrophil #: 3.6 10*3/uL (ref 1.4–6.5)
Neutrophil %: 88.7 %
PLATELETS: 100 10*3/uL — AB (ref 150–440)
RBC: 4.23 10*6/uL (ref 3.80–5.20)
RDW: 18 % — ABNORMAL HIGH (ref 11.5–14.5)
WBC: 4.1 10*3/uL (ref 3.6–11.0)

## 2013-12-18 LAB — BASIC METABOLIC PANEL
ANION GAP: 10 (ref 7–16)
BUN: 98 mg/dL — AB (ref 7–18)
Calcium, Total: 9.2 mg/dL (ref 8.5–10.1)
Chloride: 97 mmol/L — ABNORMAL LOW (ref 98–107)
Co2: 21 mmol/L (ref 21–32)
Creatinine: 11 mg/dL — ABNORMAL HIGH (ref 0.60–1.30)
GFR CALC AF AMER: 4 — AB
GFR CALC NON AF AMER: 3 — AB
GLUCOSE: 147 mg/dL — AB (ref 65–99)
OSMOLALITY: 290 (ref 275–301)
Potassium: 6.5 mmol/L (ref 3.5–5.1)
Sodium: 128 mmol/L — ABNORMAL LOW (ref 136–145)

## 2013-12-18 LAB — PROTIME-INR
INR: 1.5
Prothrombin Time: 18.1 secs — ABNORMAL HIGH (ref 11.5–14.7)

## 2013-12-18 LAB — POTASSIUM: Potassium: 6.4 mmol/L — ABNORMAL HIGH (ref 3.5–5.1)

## 2013-12-19 LAB — CBC WITH DIFFERENTIAL/PLATELET
BASOS PCT: 0.5 %
Basophil #: 0 10*3/uL (ref 0.0–0.1)
EOS PCT: 0.6 %
Eosinophil #: 0 10*3/uL (ref 0.0–0.7)
HCT: 35.1 % (ref 35.0–47.0)
HGB: 11.2 g/dL — AB (ref 12.0–16.0)
Lymphocyte #: 0.8 10*3/uL — ABNORMAL LOW (ref 1.0–3.6)
Lymphocyte %: 27.9 %
MCH: 31.9 pg (ref 26.0–34.0)
MCHC: 31.8 g/dL — ABNORMAL LOW (ref 32.0–36.0)
MCV: 100 fL (ref 80–100)
Monocyte #: 0.2 x10 3/mm (ref 0.2–0.9)
Monocyte %: 7.4 %
NEUTROS PCT: 63.6 %
Neutrophil #: 1.9 10*3/uL (ref 1.4–6.5)
PLATELETS: 115 10*3/uL — AB (ref 150–440)
RBC: 3.5 10*6/uL — ABNORMAL LOW (ref 3.80–5.20)
RDW: 17.3 % — AB (ref 11.5–14.5)
WBC: 3 10*3/uL — ABNORMAL LOW (ref 3.6–11.0)

## 2013-12-19 LAB — BASIC METABOLIC PANEL
ANION GAP: 8 (ref 7–16)
BUN: 64 mg/dL — AB (ref 7–18)
CALCIUM: 8.5 mg/dL (ref 8.5–10.1)
CREATININE: 7.49 mg/dL — AB (ref 0.60–1.30)
Chloride: 98 mmol/L (ref 98–107)
Co2: 27 mmol/L (ref 21–32)
EGFR (African American): 6 — ABNORMAL LOW
EGFR (Non-African Amer.): 6 — ABNORMAL LOW
Glucose: 82 mg/dL (ref 65–99)
OSMOLALITY: 284 (ref 275–301)
Potassium: 4.9 mmol/L (ref 3.5–5.1)
SODIUM: 133 mmol/L — AB (ref 136–145)

## 2013-12-23 ENCOUNTER — Ambulatory Visit: Payer: Medicare Other | Admitting: Licensed Clinical Social Worker

## 2013-12-24 ENCOUNTER — Ambulatory Visit: Payer: Self-pay | Admitting: Vascular Surgery

## 2014-01-08 IMAGING — XA IR VASCULAR PROCEDURE
3 series · 6 of 6 positions shown · IV contrast (IODINE)
Comparison: none

[Series 1: care upper arm · 2 of 2 slices shown (1 of 3)]
[im 1/2]
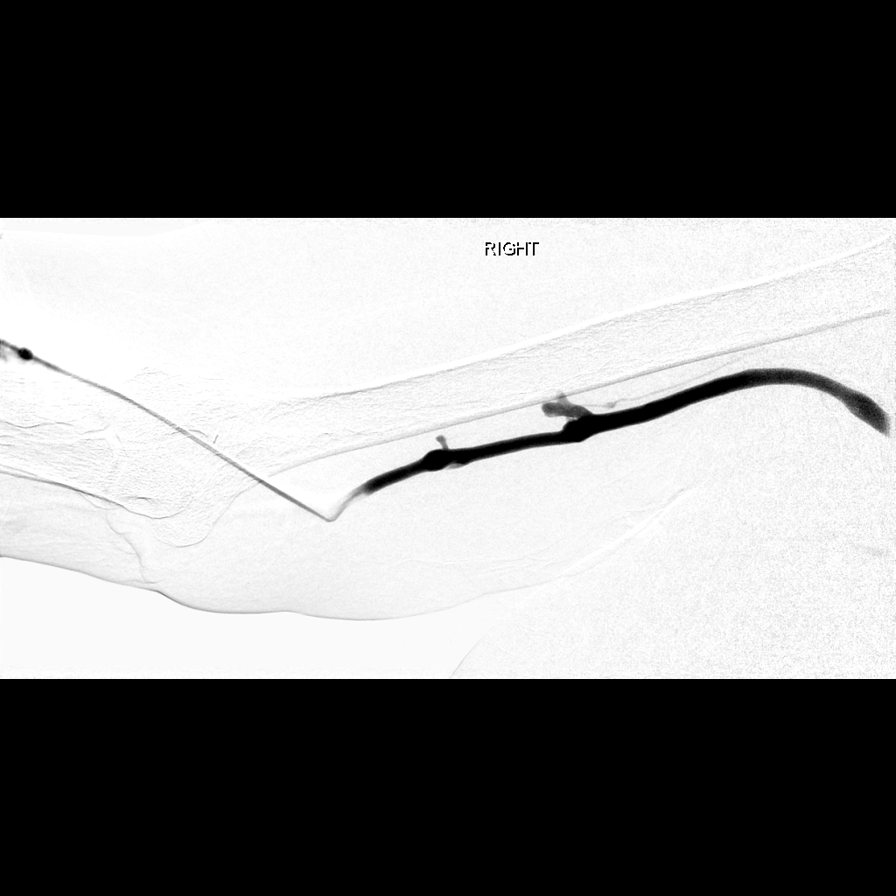
[im 2/2]
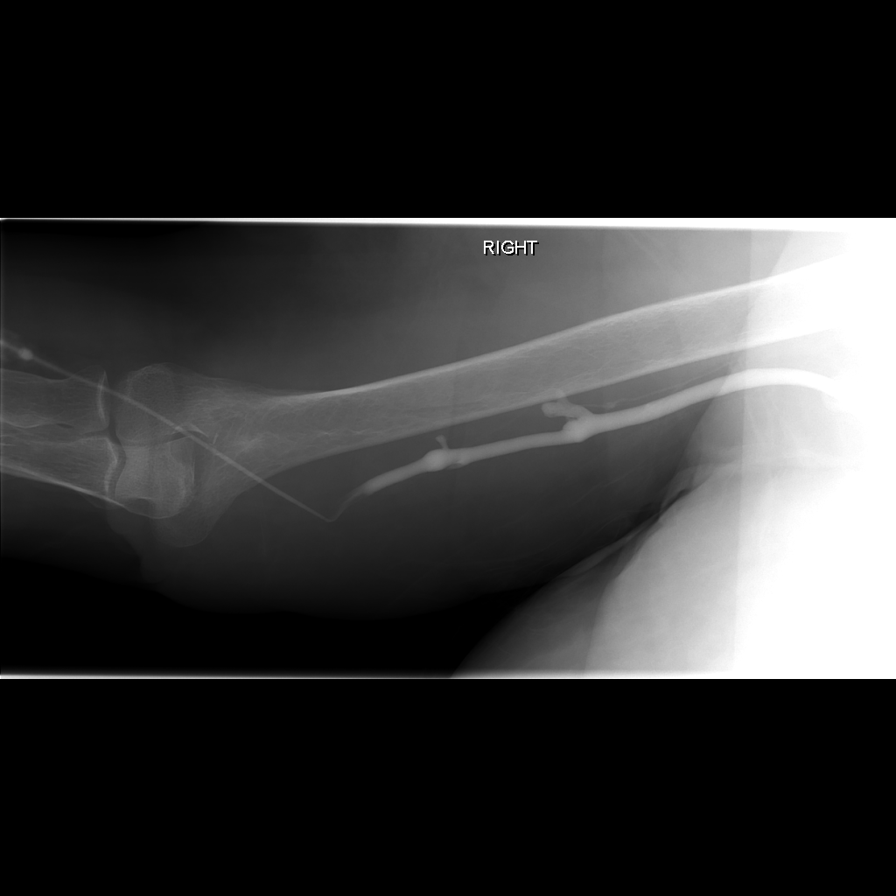

[Series 2: care upper arm · 2 of 2 slices shown (2 of 3)]
[im 1/2]
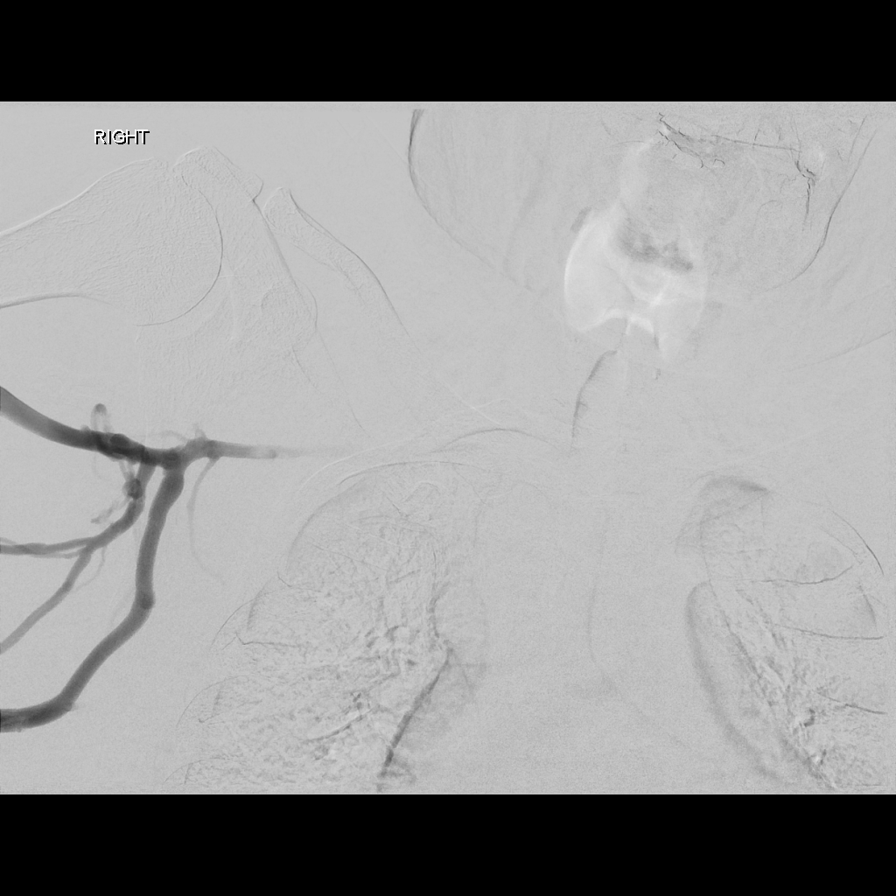
[im 2/2]
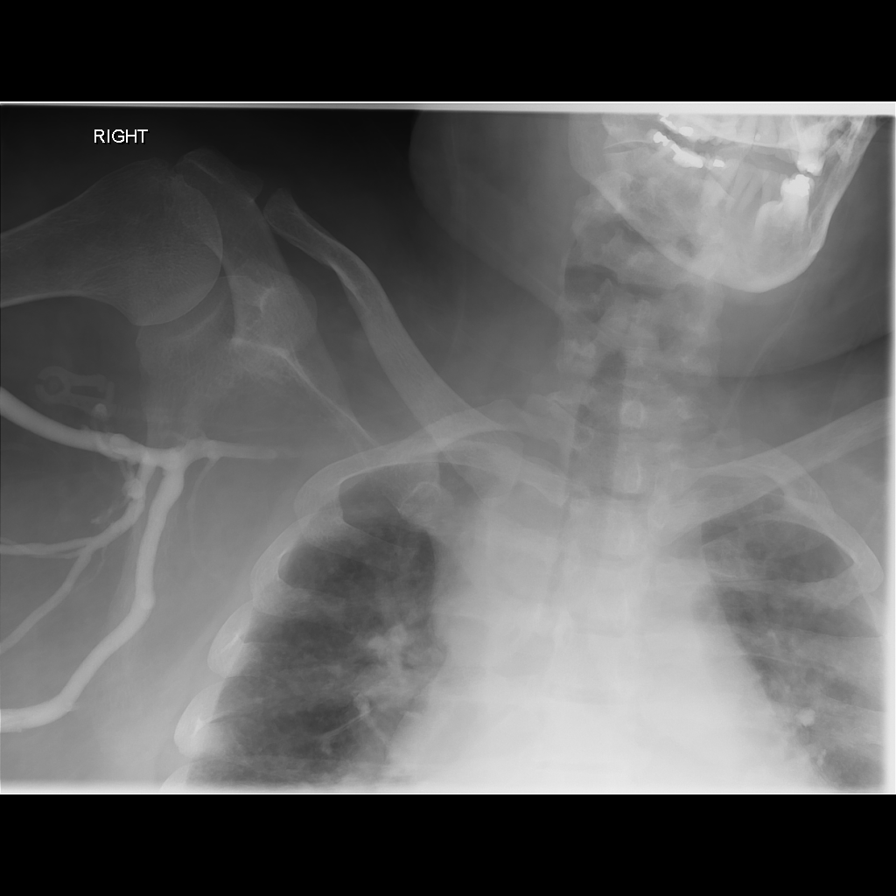

[Series 3: care upper arm · 2 of 2 slices shown (3 of 3)]
[im 1/2]
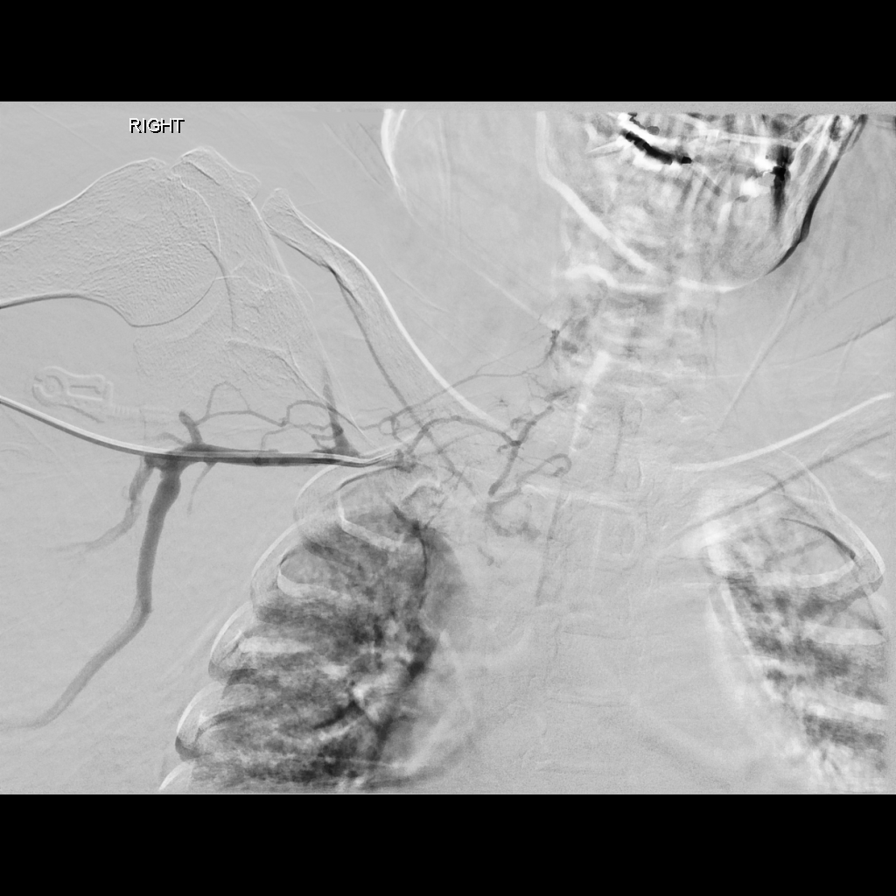
[im 2/2]
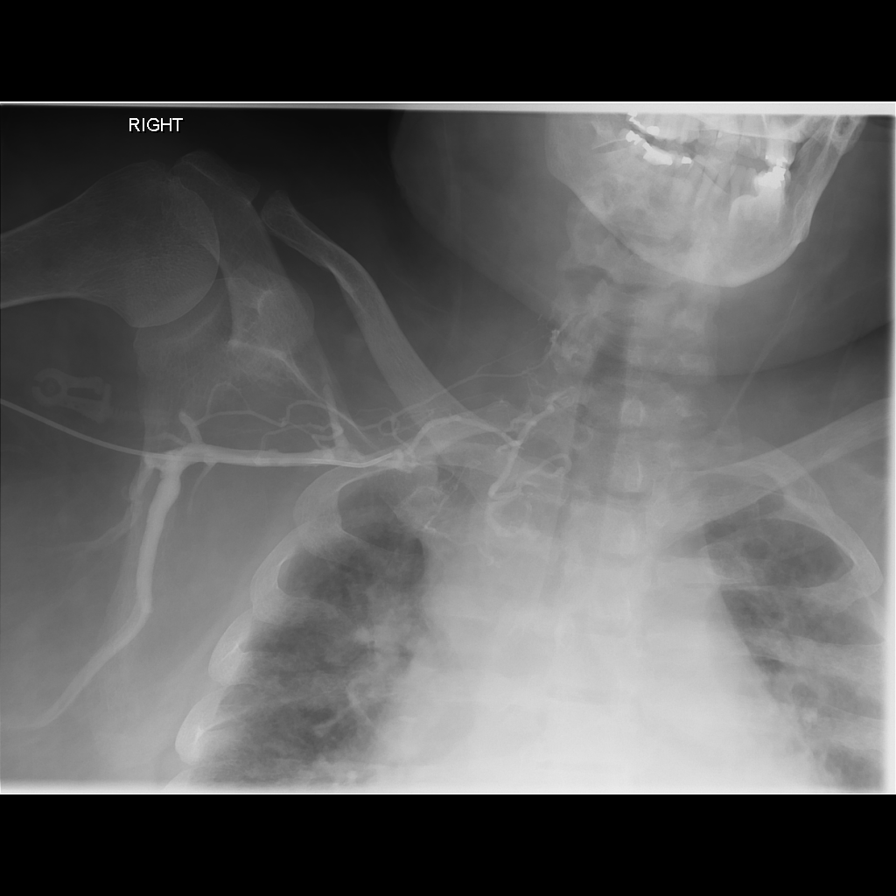

[6 of 6 positions shown; findings below may reference images not displayed]

IMAGES IMPORTED FROM THE [employer] WORKFLOW SYSTEM
NO DICTATION FOR STUDY

## 2014-02-14 IMAGING — CR DG CHEST 2V
1 series · 3 of 3 positions shown · non-contrast
Comparison: none

REASON FOR EXAM: dialysis patient, malaise, pain all over.
COMMENTS:

[Series 1: x chest ap · 0.14mm/px · 3 of 3 slices shown]
[im 1/3]
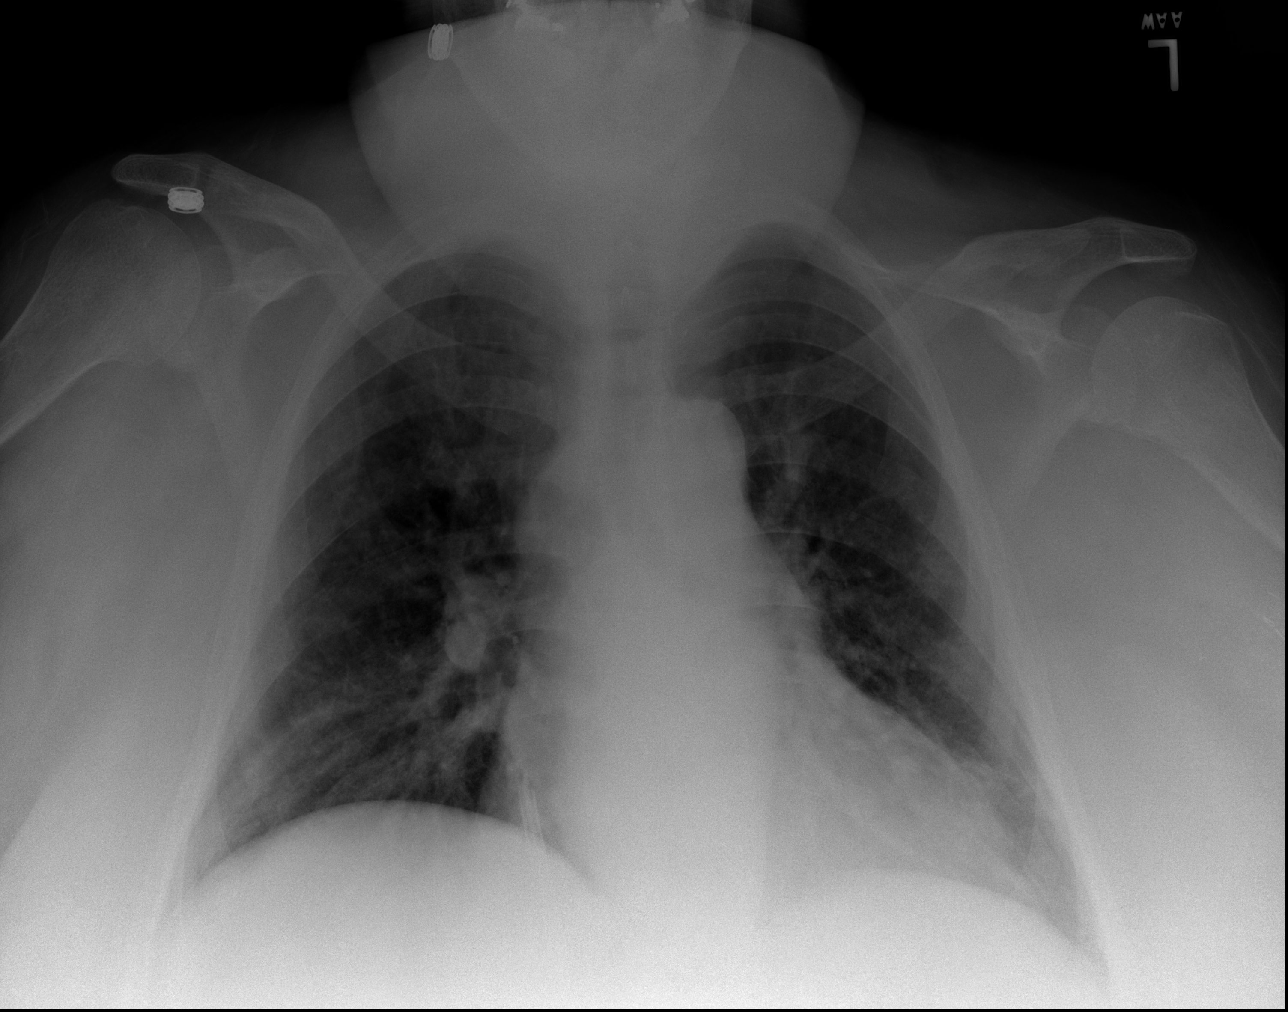
[im 2/3]
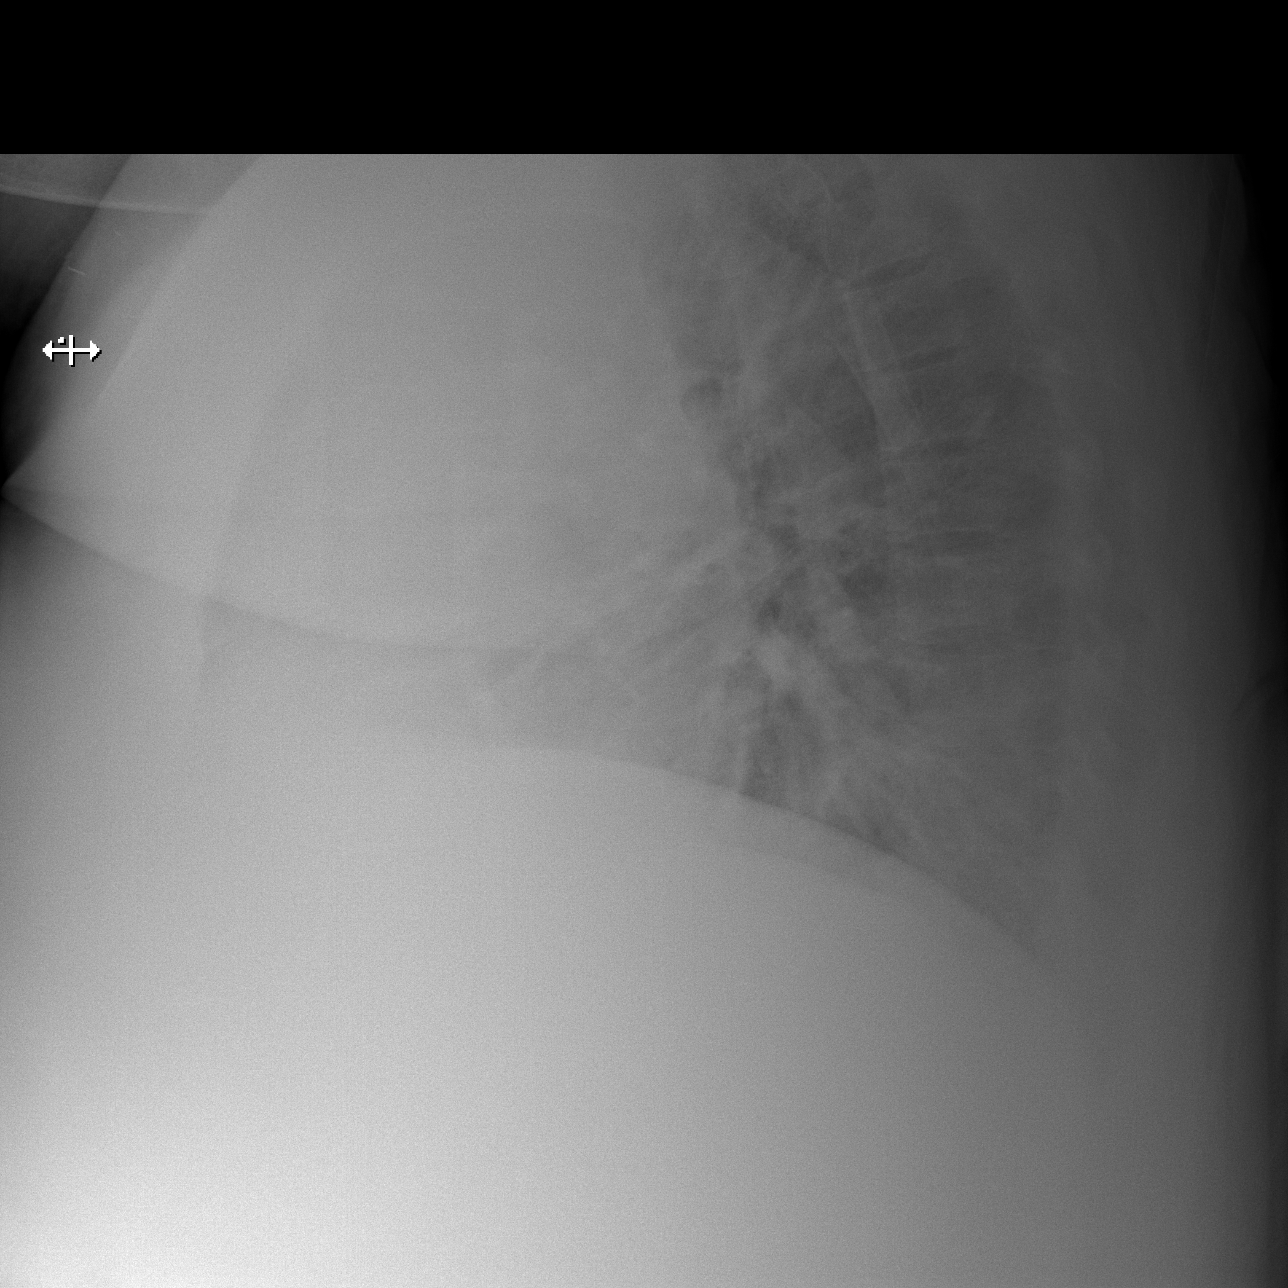
[im 3/3]
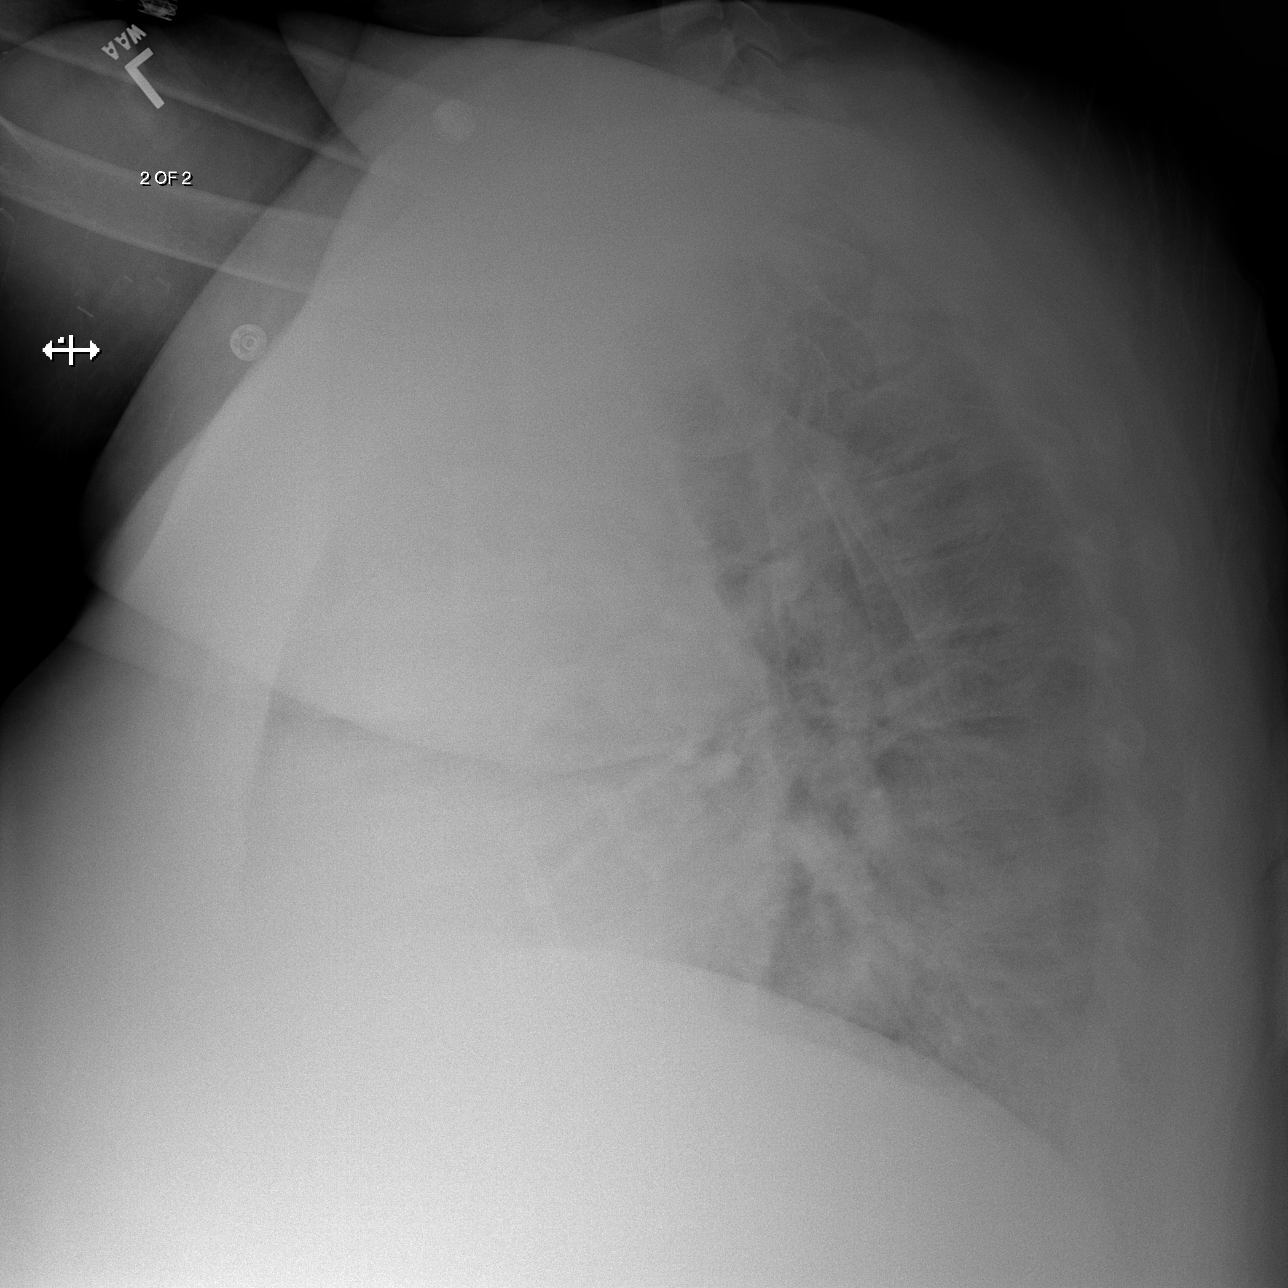

[3 of 3 positions shown; findings below may reference images not displayed]

PROCEDURE:     DXR - DXR CHEST PA (OR AP) AND LATERAL  - February 14, 2012  [DATE]

RESULT:     Comparison is made to the study 07 November, 2011.

There is lung base atelectasis. Decreased pulmonary vascular congestion and
interstitial edema is present with some continued pulmonary vascular
prominence but significant improvement has occurred since the previous
study. No significant effusion or pneumothorax is present.
IMPRESSION: 1. Pulmonary vascular congestion possibly with some basilar atelectasis or
interstitial edema. Borderline cardiomegaly.

[REDACTED]

## 2014-02-27 IMAGING — CR DG CHEST 1V PORT
1 series · 1 of 1 positions shown · non-contrast
Comparison: none

REASON FOR EXAM: hyperkalemia
COMMENTS:

[portable]
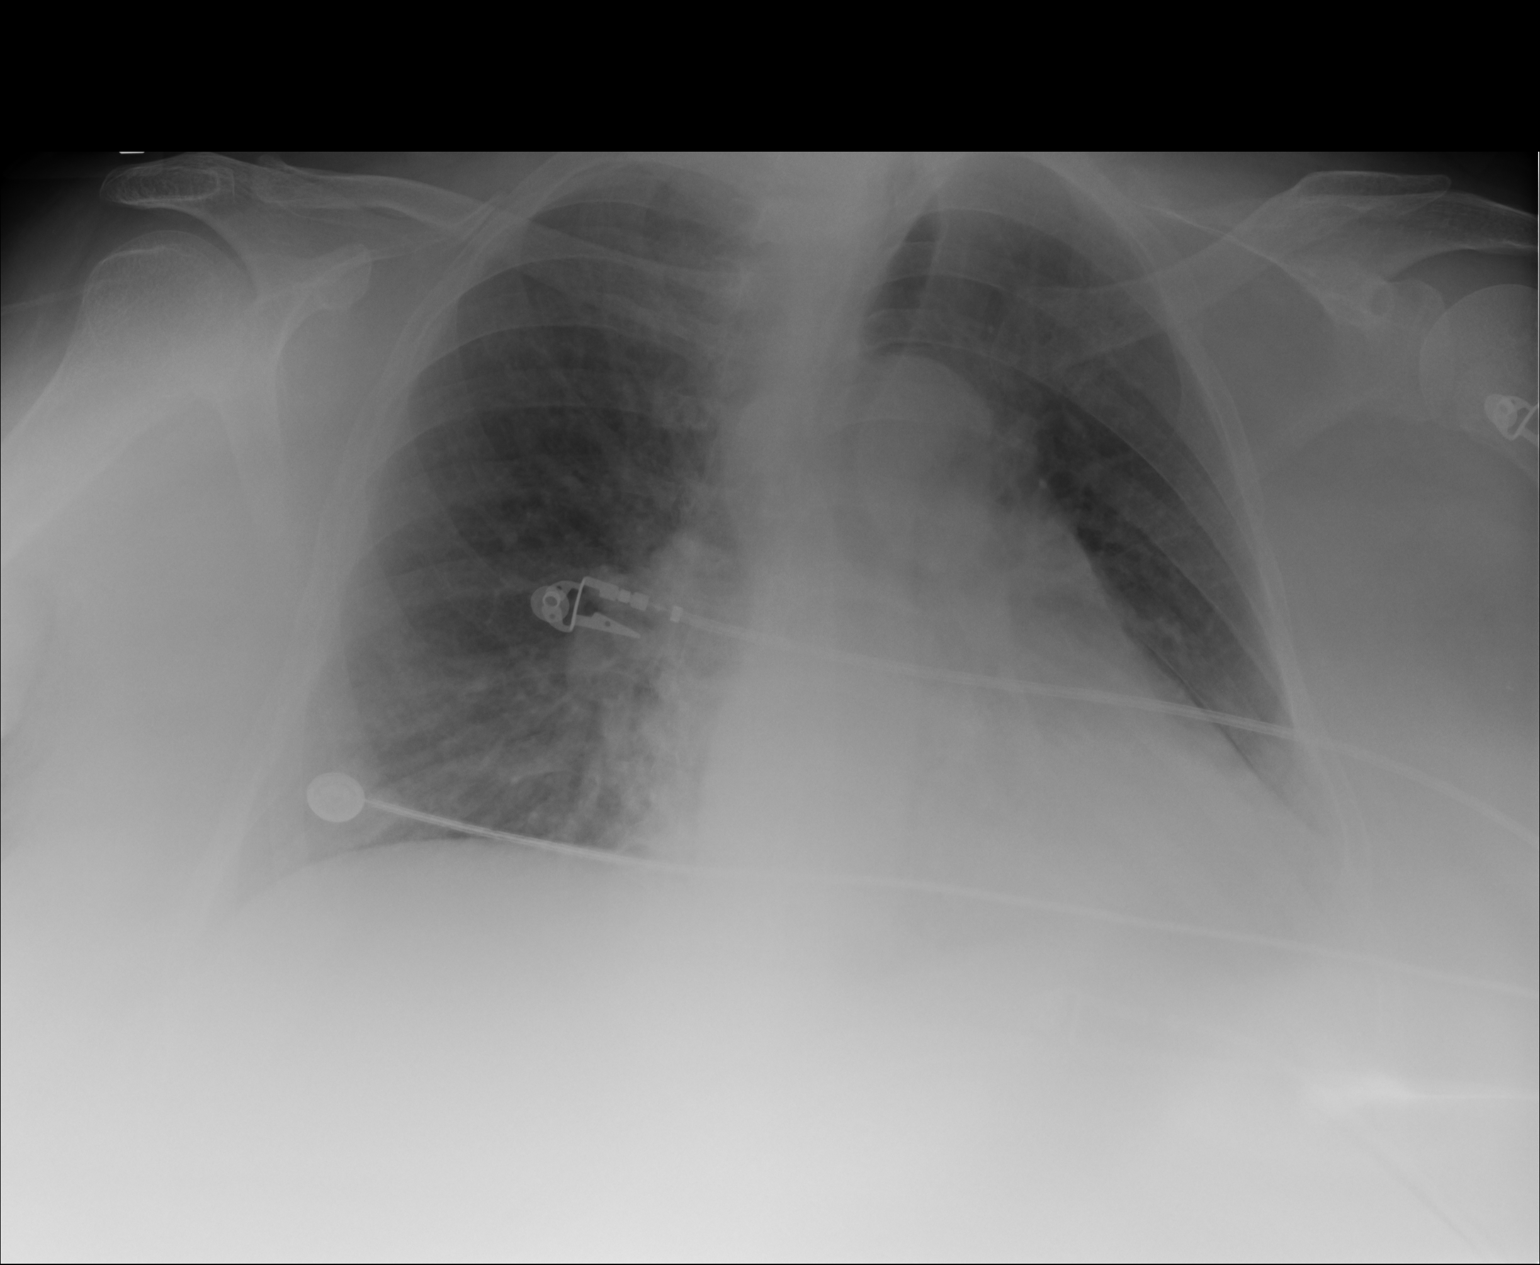

[1 of 1 positions shown; findings below may reference images not displayed]

PROCEDURE:     DXR - DXR PORTABLE CHEST SINGLE VIEW  - February 27, 2012  [DATE]

RESULT:     Comparison is made to the study dated February 14, 2012.

The cardiac silhouette is enlarged. There is no definite infiltrate, edema,
effusion or pneumothorax. The retrocardiac region is poorly seen because of
the patient's body habitus but no definite infiltrate is demonstrated.
IMPRESSION: 1.     Stable cardiomegaly.
2.     No definite acute cardiopulmonary disease.

[REDACTED]

## 2014-03-09 ENCOUNTER — Emergency Department: Payer: Self-pay | Admitting: Emergency Medicine

## 2014-03-09 LAB — COMPREHENSIVE METABOLIC PANEL
Albumin: 3.1 g/dL — ABNORMAL LOW (ref 3.4–5.0)
Alkaline Phosphatase: 226 U/L — ABNORMAL HIGH
Anion Gap: 11 (ref 7–16)
BUN: 31 mg/dL — ABNORMAL HIGH (ref 7–18)
Bilirubin,Total: 0.6 mg/dL (ref 0.2–1.0)
CALCIUM: 8.6 mg/dL (ref 8.5–10.1)
CO2: 27 mmol/L (ref 21–32)
CREATININE: 4.72 mg/dL — AB (ref 0.60–1.30)
Chloride: 102 mmol/L (ref 98–107)
EGFR (African American): 11 — ABNORMAL LOW
EGFR (Non-African Amer.): 10 — ABNORMAL LOW
GLUCOSE: 89 mg/dL (ref 65–99)
Osmolality: 285 (ref 275–301)
Potassium: 3.9 mmol/L (ref 3.5–5.1)
SGOT(AST): 21 U/L (ref 15–37)
SGPT (ALT): 18 U/L
SODIUM: 140 mmol/L (ref 136–145)
TOTAL PROTEIN: 7.2 g/dL (ref 6.4–8.2)

## 2014-03-09 LAB — PROTIME-INR
INR: 1.1
Prothrombin Time: 13.6 secs (ref 11.5–14.7)

## 2014-03-09 LAB — CBC
HCT: 27.3 % — ABNORMAL LOW (ref 35.0–47.0)
HGB: 8.6 g/dL — ABNORMAL LOW (ref 12.0–16.0)
MCH: 32 pg (ref 26.0–34.0)
MCHC: 31.6 g/dL — ABNORMAL LOW (ref 32.0–36.0)
MCV: 101 fL — ABNORMAL HIGH (ref 80–100)
Platelet: 157 10*3/uL (ref 150–440)
RBC: 2.7 10*6/uL — ABNORMAL LOW (ref 3.80–5.20)
RDW: 17.8 % — AB (ref 11.5–14.5)
WBC: 3.2 10*3/uL — ABNORMAL LOW (ref 3.6–11.0)

## 2014-03-09 LAB — APTT: Activated PTT: 32.9 secs (ref 23.6–35.9)

## 2014-03-09 LAB — TROPONIN I: Troponin-I: <0.02 ng/mL

## 2014-03-13 ENCOUNTER — Observation Stay: Payer: Self-pay | Admitting: Internal Medicine

## 2014-03-13 LAB — BASIC METABOLIC PANEL
Anion Gap: 10 (ref 7–16)
BUN: 9 mg/dL (ref 7–18)
CO2: 32 mmol/L (ref 21–32)
CREATININE: 2.07 mg/dL — AB (ref 0.60–1.30)
Calcium, Total: 8.9 mg/dL (ref 8.5–10.1)
Chloride: 99 mmol/L (ref 98–107)
EGFR (African American): 30 — ABNORMAL LOW
EGFR (Non-African Amer.): 26 — ABNORMAL LOW
Glucose: 100 mg/dL — ABNORMAL HIGH (ref 65–99)
Osmolality: 280 (ref 275–301)
Potassium: 3.5 mmol/L (ref 3.5–5.1)
Sodium: 141 mmol/L (ref 136–145)

## 2014-03-13 LAB — CBC WITH DIFFERENTIAL/PLATELET
BASOS ABS: 0.1 10*3/uL (ref 0.0–0.1)
BASOS PCT: 1.1 %
EOS ABS: 0.1 10*3/uL (ref 0.0–0.7)
Eosinophil %: 1.7 %
HCT: 31.9 % — AB (ref 35.0–47.0)
HGB: 9.9 g/dL — ABNORMAL LOW (ref 12.0–16.0)
Lymphocyte #: 1.1 10*3/uL (ref 1.0–3.6)
Lymphocyte %: 20.5 %
MCH: 32.1 pg (ref 26.0–34.0)
MCHC: 31 g/dL — AB (ref 32.0–36.0)
MCV: 104 fL — AB (ref 80–100)
MONO ABS: 0.3 x10 3/mm (ref 0.2–0.9)
MONOS PCT: 5.5 %
Neutrophil #: 3.9 10*3/uL (ref 1.4–6.5)
Neutrophil %: 71.2 %
Platelet: 177 10*3/uL (ref 150–440)
RBC: 3.09 10*6/uL — ABNORMAL LOW (ref 3.80–5.20)
RDW: 19 % — ABNORMAL HIGH (ref 11.5–14.5)
WBC: 5.5 10*3/uL (ref 3.6–11.0)

## 2014-03-13 LAB — TROPONIN I
TROPONIN-I: 0.03 ng/mL
Troponin-I: <0.02 ng/mL

## 2014-03-14 LAB — TROPONIN I
Troponin-I: 0.03 ng/mL
Troponin-I: 0.03 ng/mL

## 2014-03-19 ENCOUNTER — Ambulatory Visit: Payer: Self-pay | Admitting: Vascular Surgery

## 2014-03-30 ENCOUNTER — Emergency Department: Payer: Self-pay | Admitting: Emergency Medicine

## 2014-03-30 LAB — CBC WITH DIFFERENTIAL/PLATELET
BASOS PCT: 1.4 %
Basophil #: 0.1 10*3/uL (ref 0.0–0.1)
EOS ABS: 0.1 10*3/uL (ref 0.0–0.7)
Eosinophil %: 2.6 %
HCT: 29.2 % — AB (ref 35.0–47.0)
HGB: 9 g/dL — AB (ref 12.0–16.0)
LYMPHS ABS: 1 10*3/uL (ref 1.0–3.6)
LYMPHS PCT: 21.3 %
MCH: 32.1 pg (ref 26.0–34.0)
MCHC: 30.8 g/dL — AB (ref 32.0–36.0)
MCV: 104 fL — AB (ref 80–100)
MONO ABS: 0.2 x10 3/mm (ref 0.2–0.9)
MONOS PCT: 4.4 %
NEUTROS ABS: 3.3 10*3/uL (ref 1.4–6.5)
Neutrophil %: 70.3 %
Platelet: 157 10*3/uL (ref 150–440)
RBC: 2.8 10*6/uL — ABNORMAL LOW (ref 3.80–5.20)
RDW: 19.5 % — AB (ref 11.5–14.5)
WBC: 4.6 10*3/uL (ref 3.6–11.0)

## 2014-03-30 LAB — BASIC METABOLIC PANEL
Anion Gap: 8 (ref 7–16)
BUN: 62 mg/dL — ABNORMAL HIGH (ref 7–18)
CHLORIDE: 95 mmol/L — AB (ref 98–107)
Calcium, Total: 9.7 mg/dL (ref 8.5–10.1)
Co2: 32 mmol/L (ref 21–32)
Creatinine: 7.08 mg/dL — ABNORMAL HIGH (ref 0.60–1.30)
GFR CALC AF AMER: 7 — AB
GFR CALC NON AF AMER: 6 — AB
GLUCOSE: 96 mg/dL (ref 65–99)
Osmolality: 288 (ref 275–301)
Potassium: 4.9 mmol/L (ref 3.5–5.1)
Sodium: 135 mmol/L — ABNORMAL LOW (ref 136–145)

## 2014-03-30 LAB — TROPONIN I

## 2014-04-17 ENCOUNTER — Emergency Department: Payer: Self-pay | Admitting: Internal Medicine

## 2014-04-17 LAB — COMPREHENSIVE METABOLIC PANEL
ALK PHOS: 253 U/L — AB
ANION GAP: 7 (ref 7–16)
AST: 20 U/L (ref 15–37)
Albumin: 3.7 g/dL (ref 3.4–5.0)
BILIRUBIN TOTAL: 0.5 mg/dL (ref 0.2–1.0)
BUN: 20 mg/dL — ABNORMAL HIGH (ref 7–18)
CALCIUM: 8.7 mg/dL (ref 8.5–10.1)
Chloride: 97 mmol/L — ABNORMAL LOW (ref 98–107)
Co2: 33 mmol/L — ABNORMAL HIGH (ref 21–32)
Creatinine: 3.58 mg/dL — ABNORMAL HIGH (ref 0.60–1.30)
GFR CALC AF AMER: 17 — AB
GFR CALC NON AF AMER: 14 — AB
Glucose: 86 mg/dL (ref 65–99)
Osmolality: 276 (ref 275–301)
POTASSIUM: 3.8 mmol/L (ref 3.5–5.1)
SGPT (ALT): 22 U/L
SODIUM: 137 mmol/L (ref 136–145)
Total Protein: 8 g/dL (ref 6.4–8.2)

## 2014-04-17 LAB — CBC
HCT: 34.5 % — AB (ref 35.0–47.0)
HGB: 10.5 g/dL — ABNORMAL LOW (ref 12.0–16.0)
MCH: 32.3 pg (ref 26.0–34.0)
MCHC: 30.5 g/dL — ABNORMAL LOW (ref 32.0–36.0)
MCV: 106 fL — AB (ref 80–100)
PLATELETS: 134 10*3/uL — AB (ref 150–440)
RBC: 3.25 10*6/uL — ABNORMAL LOW (ref 3.80–5.20)
RDW: 19.3 % — ABNORMAL HIGH (ref 11.5–14.5)
WBC: 5.5 10*3/uL (ref 3.6–11.0)

## 2014-04-17 LAB — TROPONIN I: Troponin-I: <0.02 ng/mL

## 2014-04-26 ENCOUNTER — Ambulatory Visit (INDEPENDENT_AMBULATORY_CARE_PROVIDER_SITE_OTHER): Payer: Self-pay

## 2014-04-26 ENCOUNTER — Ambulatory Visit (INDEPENDENT_AMBULATORY_CARE_PROVIDER_SITE_OTHER): Payer: Medicare Other | Admitting: Neurology

## 2014-04-26 ENCOUNTER — Encounter (INDEPENDENT_AMBULATORY_CARE_PROVIDER_SITE_OTHER): Payer: Self-pay

## 2014-04-26 DIAGNOSIS — G63 Polyneuropathy in diseases classified elsewhere: Secondary | ICD-10-CM

## 2014-04-26 DIAGNOSIS — Z0289 Encounter for other administrative examinations: Secondary | ICD-10-CM

## 2014-04-26 IMAGING — CT CT CERVICAL SPINE WITHOUT CONTRAST
1 series · 12 of 14 positions shown, 15 images · non-contrast
Comparison: none

REASON FOR EXAM: neck pain
COMMENTS:

PROCEDURE:     CT  - CT CERVICAL SPINE WO  - April 25, 2012  [DATE]
RESULT:     Comparison: None.
TECHNIQUE: Multiple axial CT images were obtained of the cervical spine,
without intravenous contrast.  Sagittal and coronal reformatted images were
constructed.

[Series 6: axial · axial · 0.33mm/px · z∈[-301,-175]mm · 12 of 93 slices shown, 15 images]
[im 8/93  soft-tissue]
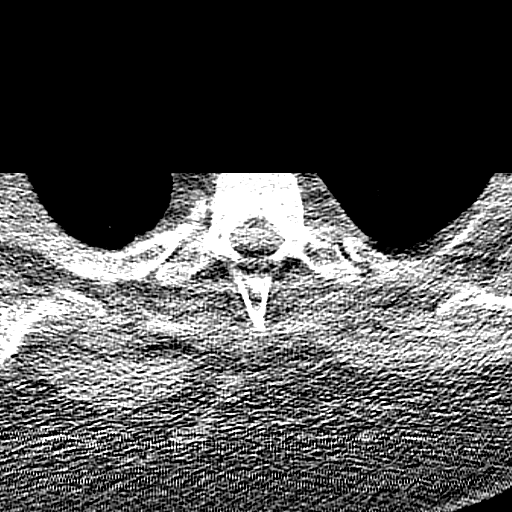
[im 8/93  bone]
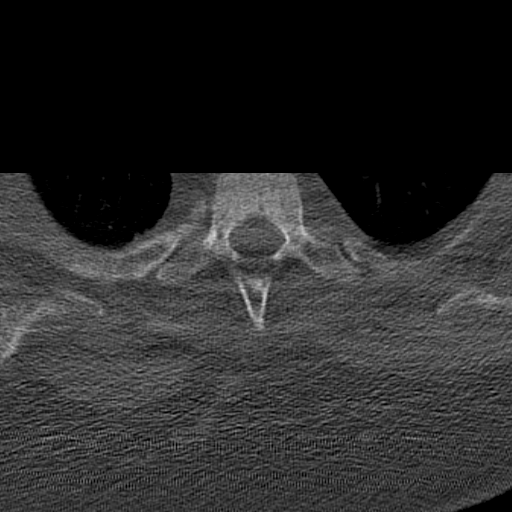
[im 15/93  bone]
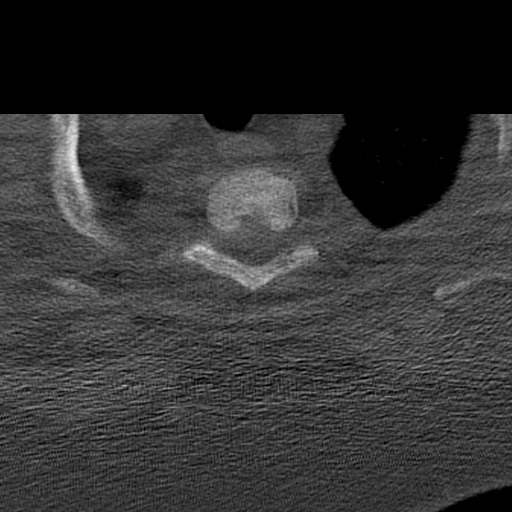
[im 22/93  bone]
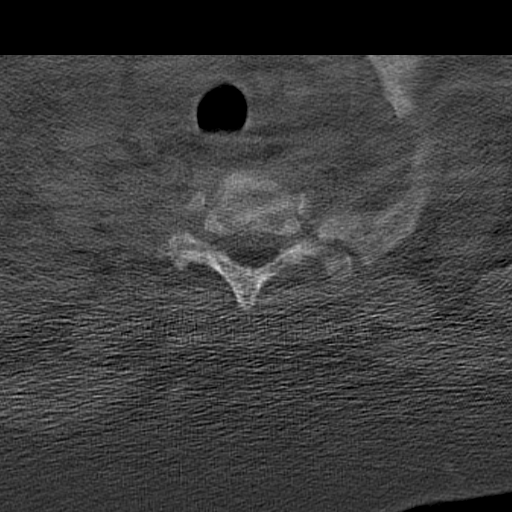
[im 29/93  bone]
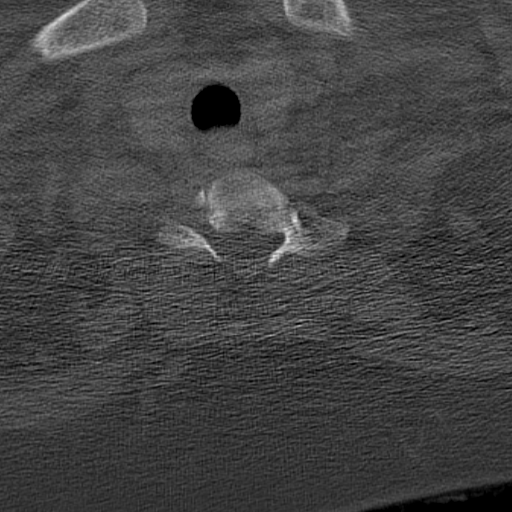
[im 36/93  soft-tissue]
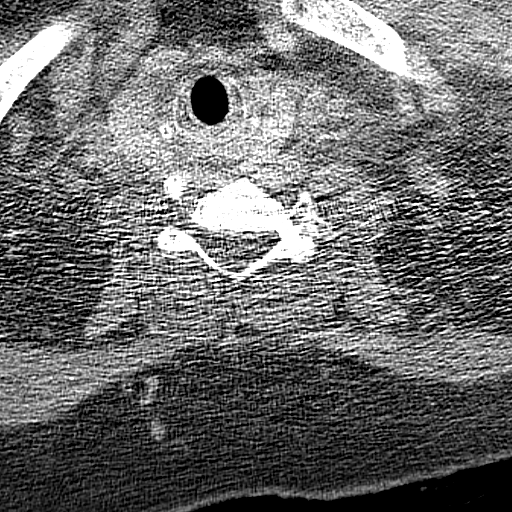
[im 36/93  bone]
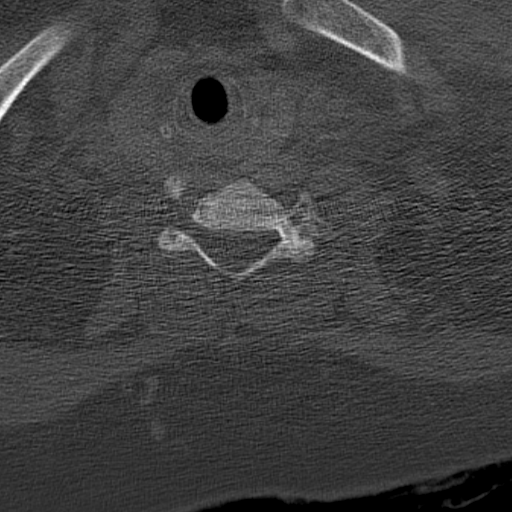
[im 43/93  bone]
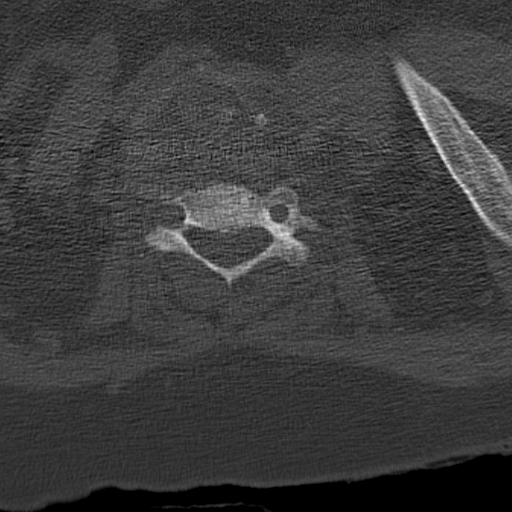
[im 50/93  bone]
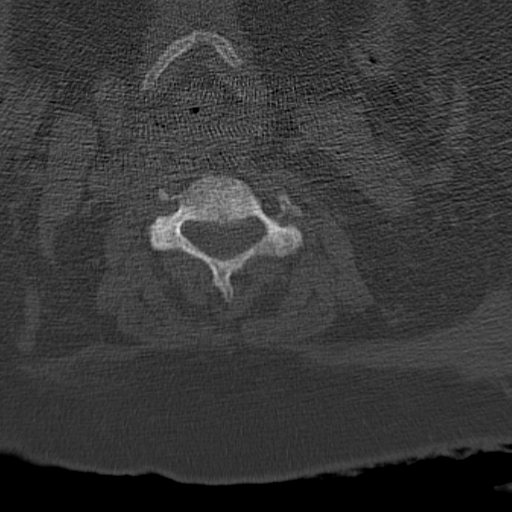
[im 57/93  bone]
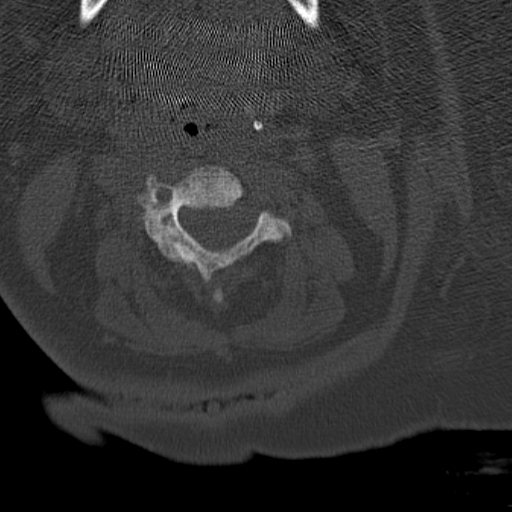
[im 64/93  soft-tissue]
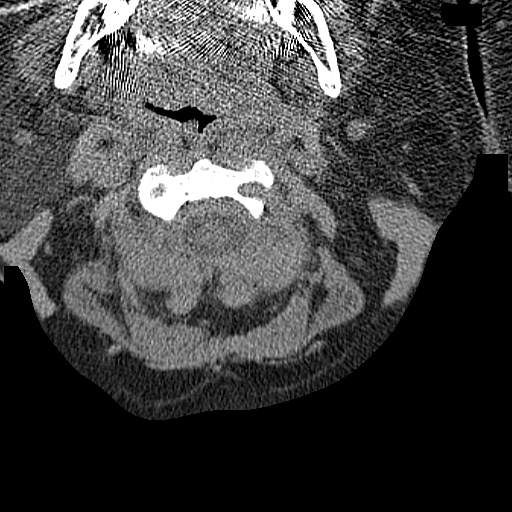
[im 64/93  bone]
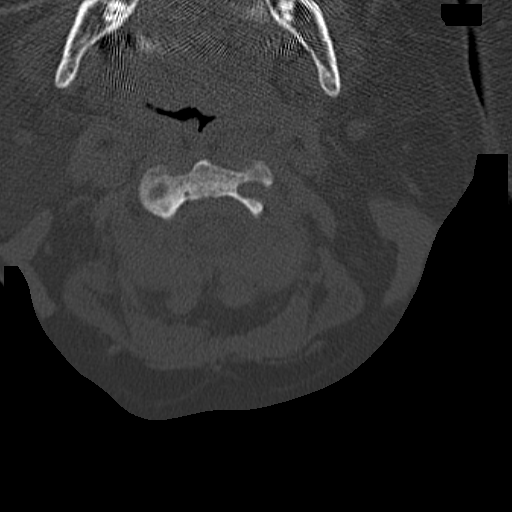
[im 71/93  bone]
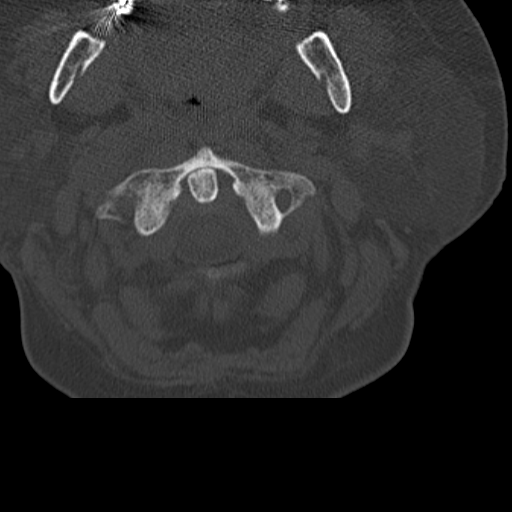
[im 78/93  bone]
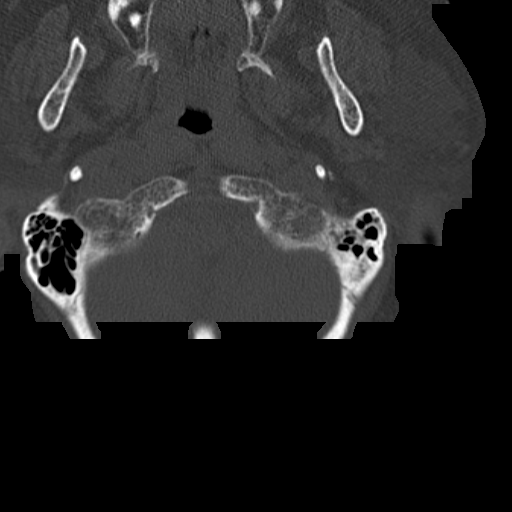
[im 85/93  bone]
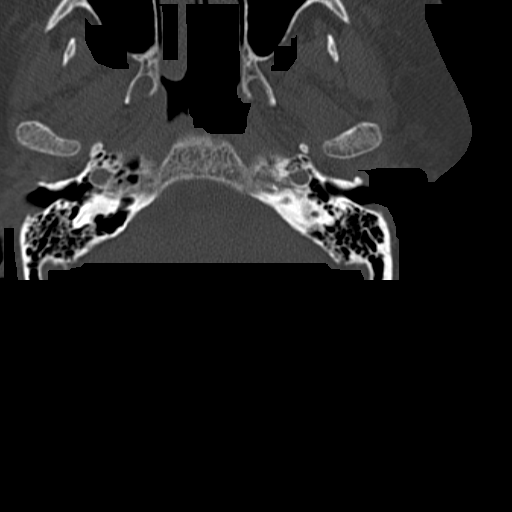

[12 of 14 positions shown; findings below may reference images not displayed]

FINDINGS: Evaluation of the lower cervical spine, inferior to C5 is limited by quantum
mottle artifact secondary to patient body habitus. There is congenital
fusion of C2-C3 and the posterior elements of C2-C3. There is approximately
2 mm of anterolisthesis of C3 on C4. No cervical spine fracture identified.
IMPRESSION: 1. No cervical spine fracture identified. However, evaluation of the lower
cervical spine is limited secondary to quantum mottle artifact secondary to
patient body habitus.
2. There is mild anterolisthesis of C3 on C4. This may be degenerative.
Ligamentous injury is not excluded.

This was discussed with Dr. Josceline Matusin at 1441 hours 04/25/2012.

[REDACTED]

## 2014-04-26 IMAGING — CT CT HEAD WITHOUT CONTRAST
2 series · 15 of 30 positions shown, 19 images · non-contrast
Comparison: none

REASON FOR EXAM: ha
COMMENTS:

PROCEDURE:     CT  - CT HEAD WITHOUT CONTRAST  - April 25, 2012  [DATE]
RESULT:     Comparison:  09/15/2011
TECHNIQUE: Multiple axial images from the foramen magnum to the vertex were
obtained without IV contrast.

[Series 2: without · axial · non-contrast · 0.46mm/px · z∈[-138,-18]mm · 13 of 29 slices shown, 17 images]
[im 3/29  brain]
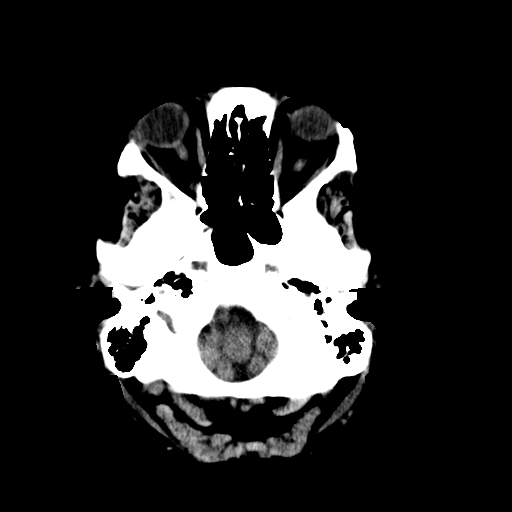
[im 3/29  bone]
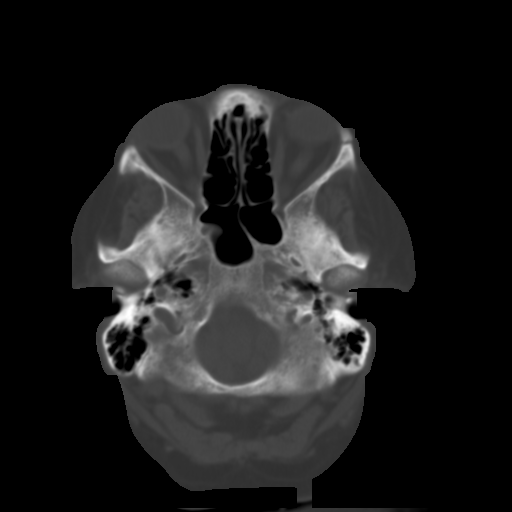
[im 5/29  brain]
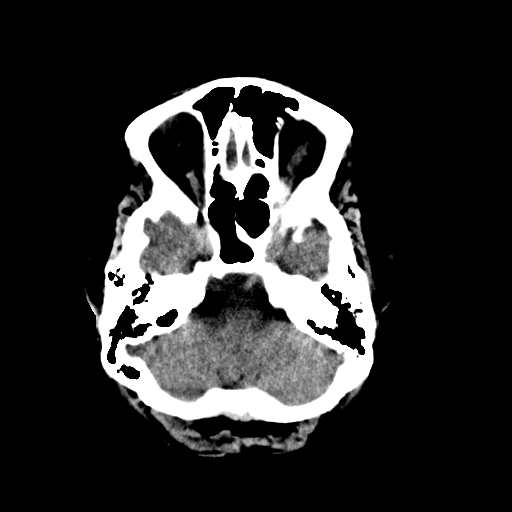
[im 7/29  brain]
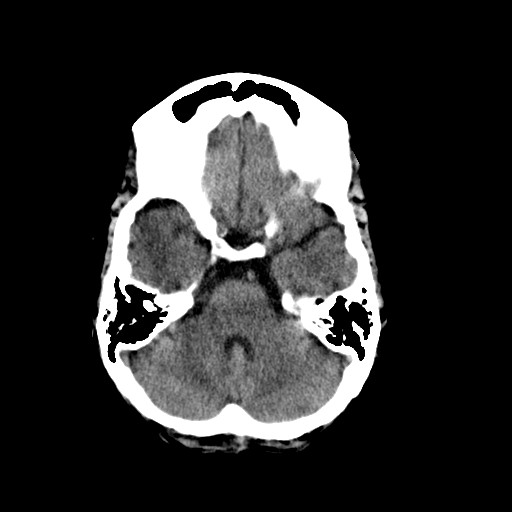
[im 9/29  brain]
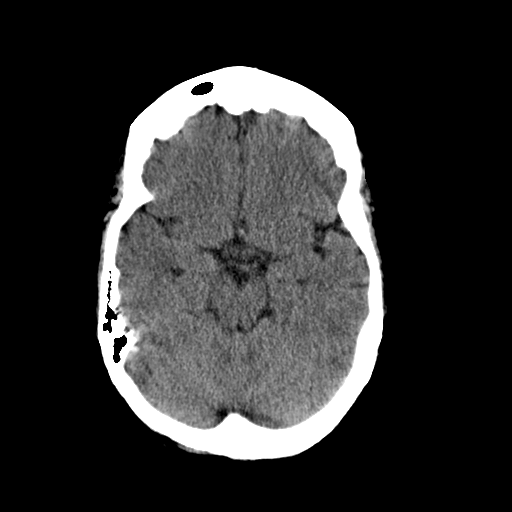
[im 11/29  brain]
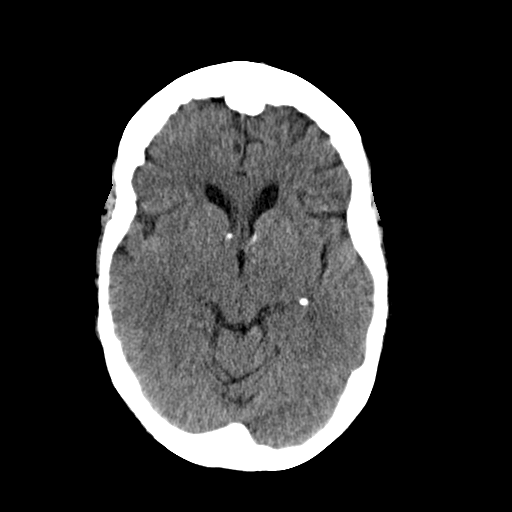
[im 11/29  bone]
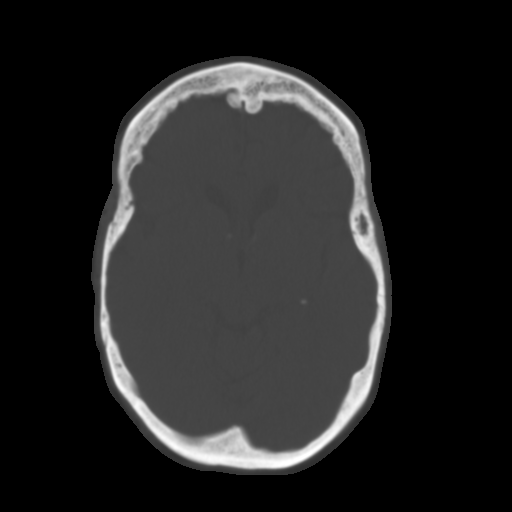
[im 13/29  brain]
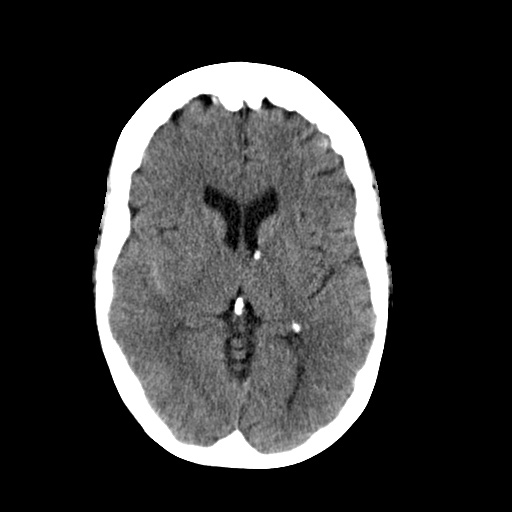
[im 15/29  brain]
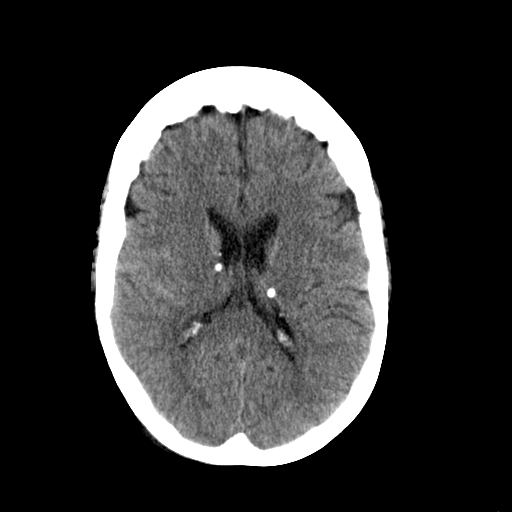
[im 17/29  brain]
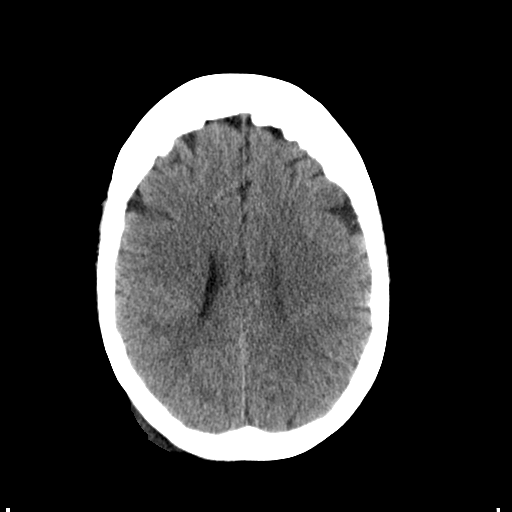
[im 19/29  brain]
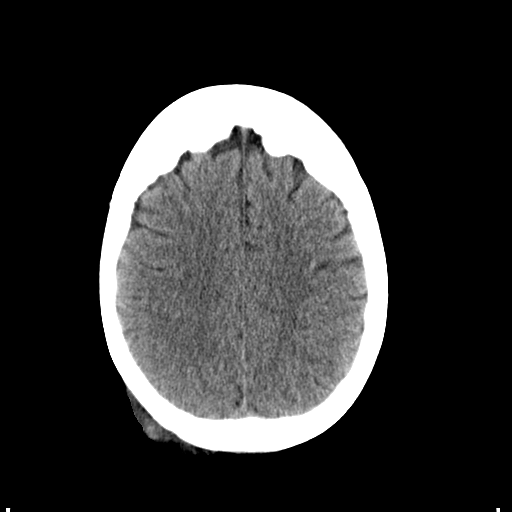
[im 19/29  bone]
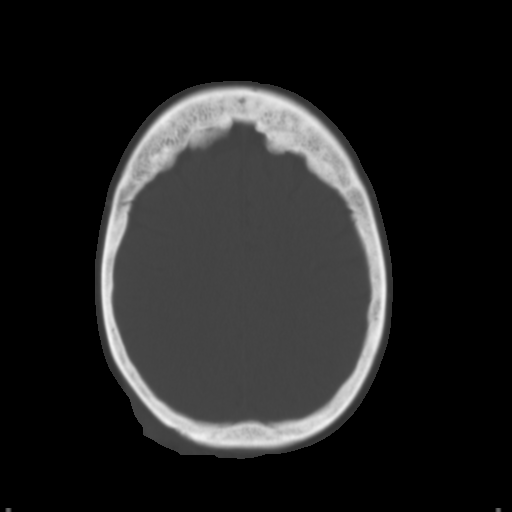
[im 21/29  brain]
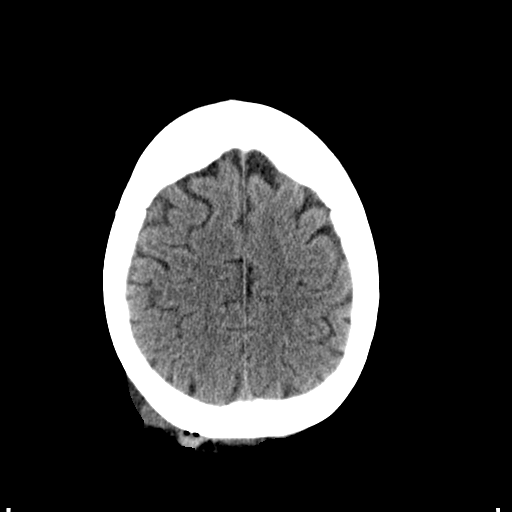
[im 23/29  brain]
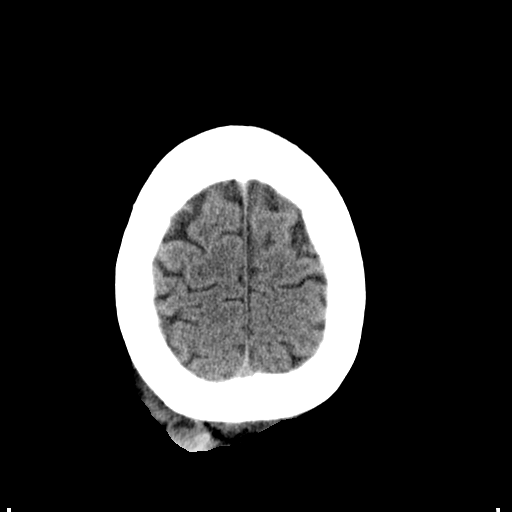
[im 25/29  brain]
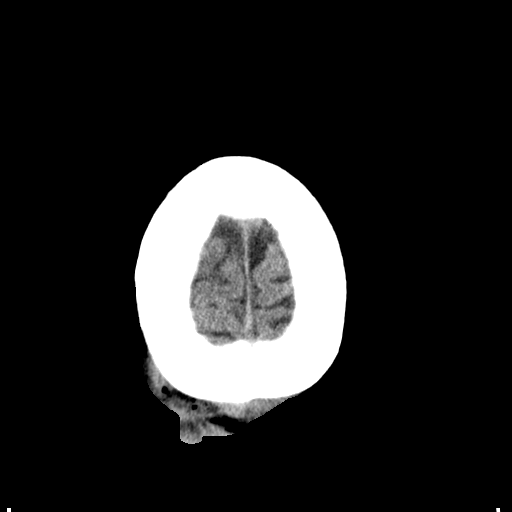
[im 27/29  brain]
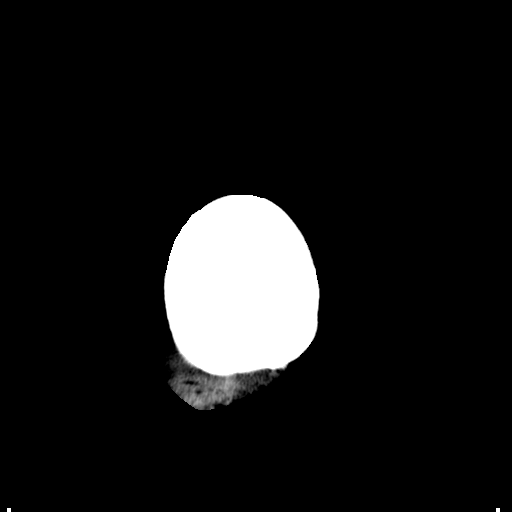
[im 27/29  bone]
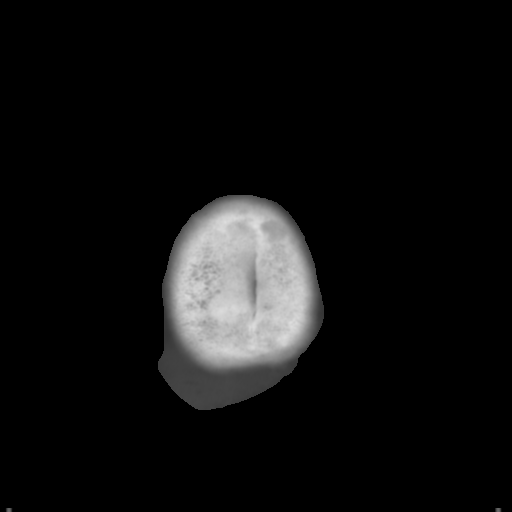

[Series 3: bone · axial · 0.46mm/px · z∈[-138,-118]mm · 2 of 29 slices shown]
[im 3/29  bone]
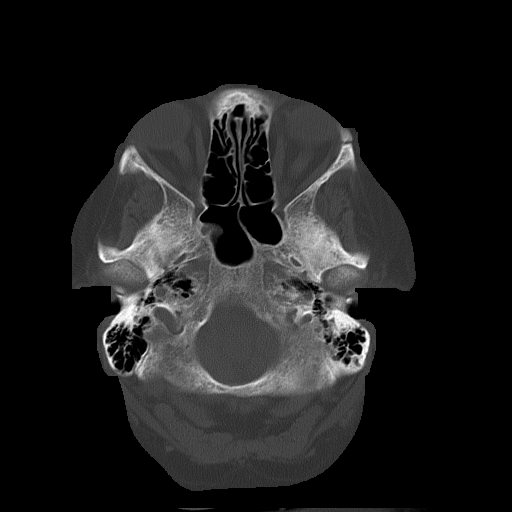
[im 7/29  bone]
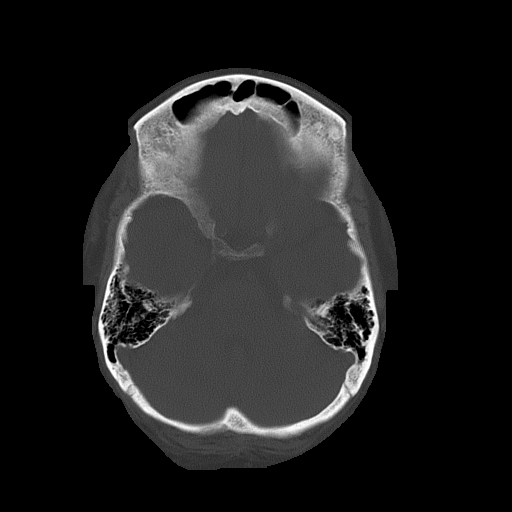

[15 of 30 positions shown; findings below may reference images not displayed]

FINDINGS: There is a small amount of subarachnoid hemorrhage in the right Jalver
figure. No extra-axial fluid collection. No midline shift. There are
multiple calcifications along the lining of the lateral ventricles. This is
similar to prior. These are nonspecific, but could represent sequela of
tuberous sclerosis or congenital TORCH infection.

There is a hematoma along the right parietal scalp. Small lesion along the
skin of the right right lobe is nonspecific, but similar to prior.

The osseous structures are unremarkable.
IMPRESSION: 1. Small amount of subarachnoid hemorrhage within the right sylvian fissure.
 This was discussed with Dr. Gloria Silva Del Sante at 5720 hours 04/25/2012.
2. Nonspecific periventricular calcifications which may represent sequela of
tuberous sclerosis or congenital TORCH infection.

[REDACTED]

## 2014-04-26 NOTE — Progress Notes (Signed)
    [facility]    Provider:  Dr Jaynee Eagles Referring Provider: Ricard Dillon, MD Primary Care Physician:  Georgetta Haber, MD  CC:  Foot pain  HPI:  [name] is a 58 y.o. female here as a referral from Dr. Arnoldo Morale for foot pain. She has a PMHx of ESRD and seizures. She describes excruciating pain in the bottom of the right foot with burning, numbness, tingling. She has similar pain in the left foot. She also endorses cramping in the feet. On Eliquis and Heparin(During HD).   Summary: Nerve conduction studies were performed on the right upper and bilateral lower extremities.   The right Median and Ulner motor nerves were within normal limits with normal F wave latencies.  The right 2nd-digit Median and 5th-digit Ulnar sensory nerves were within normal limits.   The right Peroneal motor nerve showed reduced amplitude (0.56mV, N>2) with normal F Wave latency The left Peroneal motor nerve showed reduced amplitude (0.30mV, N>2) with normal F Wave latency  The right Tibial motor nerve showed reduced amplitude (0.63mV, N>3) with normal F Wave latency The left Tibial motor nerve showed reduced amplitude (0.4 mV, N>3) with normal F Wave latency    The bilateral Peroneal and Sural  sensory nerves showed no response.  The Bilateral H Reflexes showed no response.  EMG needle study was performed on selected lower extremity muscles.The right Extensor Hallucis Longus muscle showed increased motor unit amplitude and diminished recruitment. The right Abductor Hallucis muscle showed increased spontaneous activity and absent recruitment due to pain. The following muscles were normal: right Anterior Tibialis, right Medial Gastrocnemius and right Vastus Medialis.  Assessment/Plan: There is electrophysiologic evidence of a length-dependent severe axonal sensorimotor polyneuropathy. Clinical correlation recommended.   Sarina Ill, MD  [facility]  [address] [place]  [place], [place] [address]  Phone [phone] Fax [phone]

## 2014-04-29 ENCOUNTER — Telehealth: Payer: Self-pay | Admitting: Neurology

## 2014-04-29 NOTE — Telephone Encounter (Signed)
Misty from [facility] calling to schedule a follow up for patient, patient was recently in on 04/26/14 for a NCV/EMG test. Please return call and advise.

## 2014-04-29 NOTE — Telephone Encounter (Signed)
Spoke to [place]. Advised her to have PA who wants patient to been seen, to send notes to NP coordinator to set up appt.

## 2014-05-13 ENCOUNTER — Telehealth: Payer: Self-pay | Admitting: Neurology

## 2014-05-14 ENCOUNTER — Institutional Professional Consult (permissible substitution): Payer: Medicare Other | Admitting: Neurology

## 2014-05-20 ENCOUNTER — Ambulatory Visit: Payer: Self-pay | Admitting: Vascular Surgery

## 2014-05-20 LAB — COMPREHENSIVE METABOLIC PANEL
Albumin: 3.3 g/dL — ABNORMAL LOW (ref 3.4–5.0)
Alkaline Phosphatase: 349 U/L — ABNORMAL HIGH
Anion Gap: 12 (ref 7–16)
BUN: 67 mg/dL — ABNORMAL HIGH (ref 7–18)
Bilirubin,Total: 0.9 mg/dL (ref 0.2–1.0)
CO2: 27 mmol/L (ref 21–32)
Calcium, Total: 8.6 mg/dL (ref 8.5–10.1)
Chloride: 100 mmol/L (ref 98–107)
Creatinine: 7.56 mg/dL — ABNORMAL HIGH (ref 0.60–1.30)
EGFR (African American): 7 — ABNORMAL LOW
EGFR (Non-African Amer.): 6 — ABNORMAL LOW
Glucose: 102 mg/dL — ABNORMAL HIGH (ref 65–99)
OSMOLALITY: 297 (ref 275–301)
Potassium: 5.1 mmol/L (ref 3.5–5.1)
SGOT(AST): 20 U/L (ref 15–37)
SGPT (ALT): 19 U/L
SODIUM: 139 mmol/L (ref 136–145)
Total Protein: 6.6 g/dL (ref 6.4–8.2)

## 2014-05-20 LAB — CBC
HCT: 31.1 % — ABNORMAL LOW (ref 35.0–47.0)
HGB: 9.8 g/dL — ABNORMAL LOW (ref 12.0–16.0)
MCH: 34.3 pg — ABNORMAL HIGH (ref 26.0–34.0)
MCHC: 31.5 g/dL — ABNORMAL LOW (ref 32.0–36.0)
MCV: 109 fL — ABNORMAL HIGH (ref 80–100)
Platelet: 132 10*3/uL — ABNORMAL LOW (ref 150–440)
RBC: 2.85 10*6/uL — ABNORMAL LOW (ref 3.80–5.20)
RDW: 19.7 % — ABNORMAL HIGH (ref 11.5–14.5)
WBC: 6.4 10*3/uL (ref 3.6–11.0)

## 2014-05-20 LAB — PROTIME-INR
INR: 1
PROTHROMBIN TIME: 13.3 s (ref 11.5–14.7)

## 2014-05-22 ENCOUNTER — Emergency Department: Payer: Self-pay | Admitting: Internal Medicine

## 2014-05-22 LAB — COMPREHENSIVE METABOLIC PANEL
ALT: 14 U/L
AST: 47 U/L — AB (ref 15–37)
Albumin: 3.4 g/dL (ref 3.4–5.0)
Alkaline Phosphatase: 375 U/L — ABNORMAL HIGH
Anion Gap: 6 — ABNORMAL LOW (ref 7–16)
BUN: 26 mg/dL — AB (ref 7–18)
Bilirubin,Total: 0.6 mg/dL (ref 0.2–1.0)
CALCIUM: 8.3 mg/dL — AB (ref 8.5–10.1)
CHLORIDE: 98 mmol/L (ref 98–107)
CREATININE: 4.09 mg/dL — AB (ref 0.60–1.30)
Co2: 34 mmol/L — ABNORMAL HIGH (ref 21–32)
EGFR (African American): 15 — ABNORMAL LOW
EGFR (Non-African Amer.): 12 — ABNORMAL LOW
GLUCOSE: 79 mg/dL (ref 65–99)
OSMOLALITY: 279 (ref 275–301)
Potassium: 5.5 mmol/L — ABNORMAL HIGH (ref 3.5–5.1)
Sodium: 138 mmol/L (ref 136–145)
TOTAL PROTEIN: 7.4 g/dL (ref 6.4–8.2)

## 2014-05-22 LAB — CBC
HCT: 31.1 % — ABNORMAL LOW (ref 35.0–47.0)
HGB: 9.9 g/dL — ABNORMAL LOW (ref 12.0–16.0)
MCH: 34.7 pg — AB (ref 26.0–34.0)
MCHC: 31.9 g/dL — ABNORMAL LOW (ref 32.0–36.0)
MCV: 109 fL — ABNORMAL HIGH (ref 80–100)
PLATELETS: 123 10*3/uL — AB (ref 150–440)
RBC: 2.86 10*6/uL — AB (ref 3.80–5.20)
RDW: 19.1 % — AB (ref 11.5–14.5)
WBC: 4.7 10*3/uL (ref 3.6–11.0)

## 2014-05-22 LAB — PROTIME-INR
INR: 1.1
Prothrombin Time: 13.9 secs (ref 11.5–14.7)

## 2014-05-25 IMAGING — CR DG CHEST 1V PORT
1 series · 1 of 1 positions shown · non-contrast
Comparison: none

REASON FOR EXAM: sob
COMMENTS:

PROCEDURE:     DXR - DXR PORTABLE CHEST SINGLE VIEW  - May 24, 2012  [DATE]
RESULT:     Comparison: 04/01/2012

[ap]
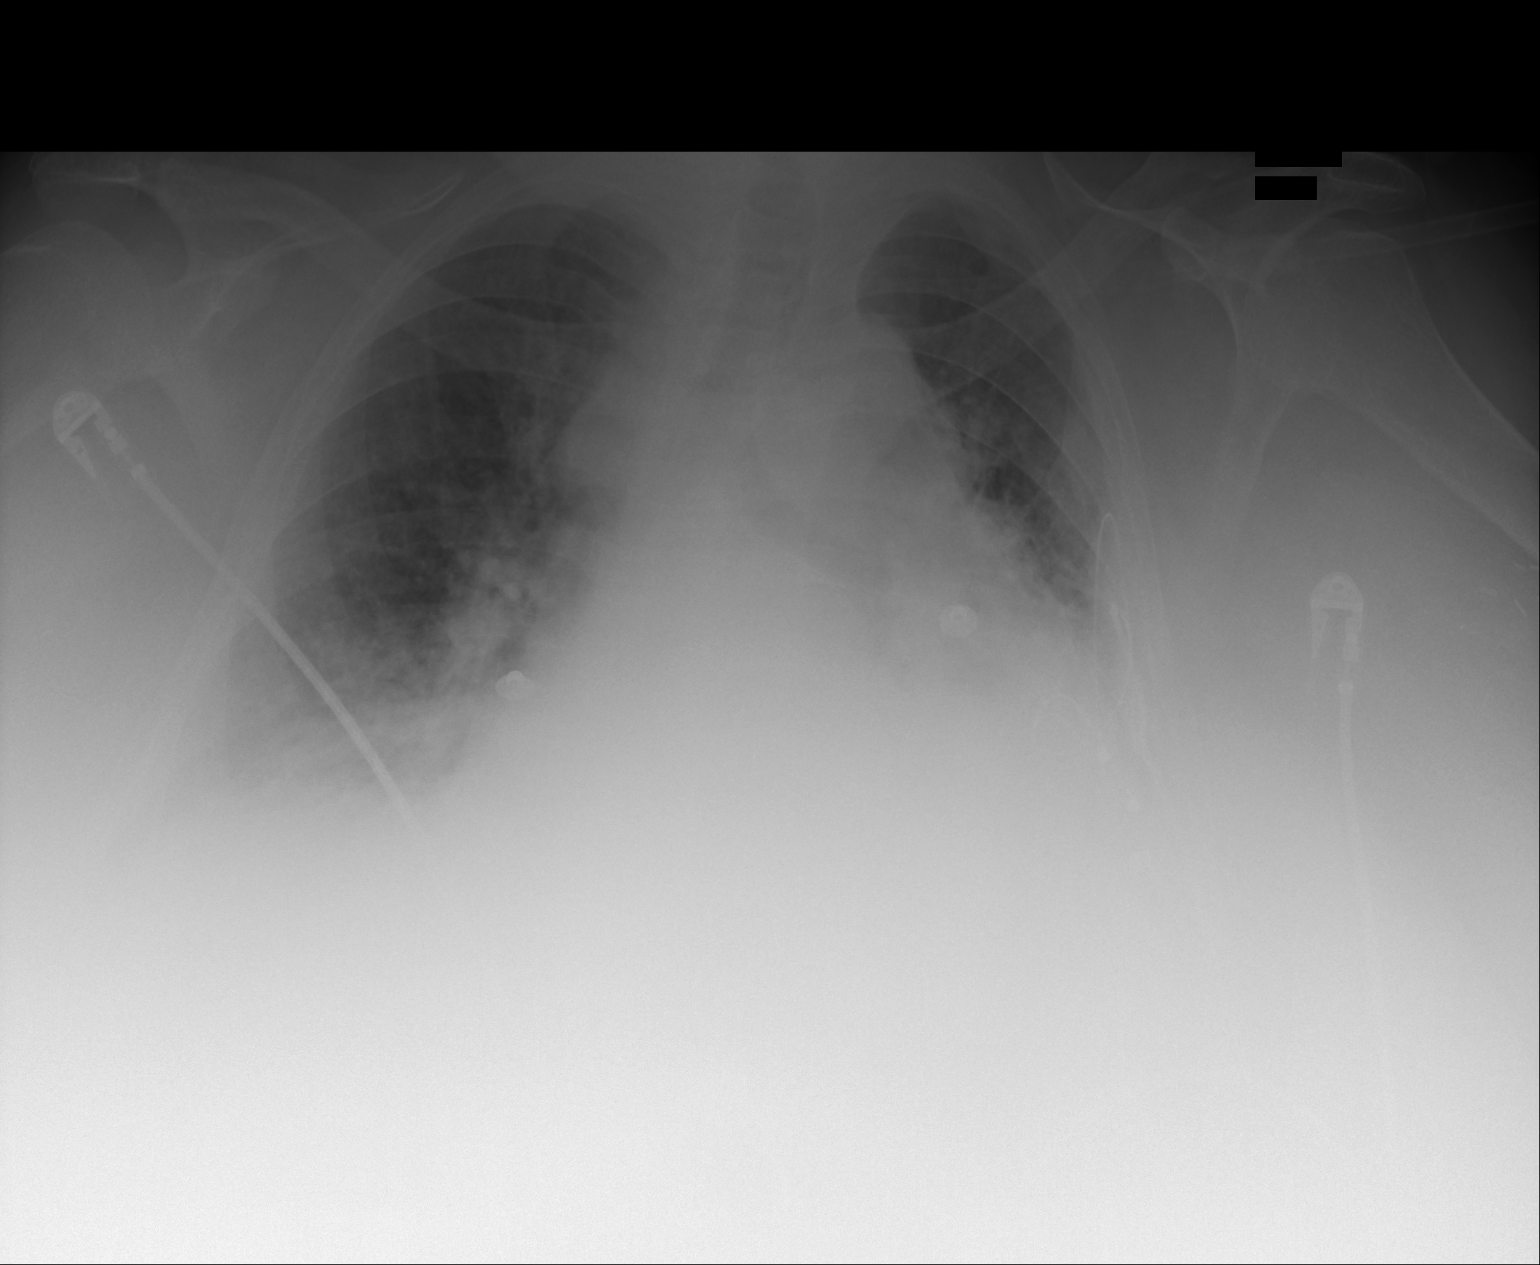

[1 of 1 positions shown; findings below may reference images not displayed]

FINDINGS: Single portable AP chest radiograph is provided. There is bilateral diffuse
interstitial thickening likely representing interstitial edema versus
interstitial pneumonitis secondary to an infectious or inflammatory
etiology. There may be a small left pleural effusion. There is no
pneumothorax. The heart size is enlarged. The osseous structures are
unremarkable.
IMPRESSION: There is bilateral diffuse interstitial thickening likely representing
interstitial edema versus interstitial pneumonitis secondary to an
infectious or inflammatory etiology.

[REDACTED]

## 2014-06-07 ENCOUNTER — Encounter: Payer: Self-pay | Admitting: Neurology

## 2014-06-07 ENCOUNTER — Ambulatory Visit (INDEPENDENT_AMBULATORY_CARE_PROVIDER_SITE_OTHER): Payer: Medicare Other | Admitting: Neurology

## 2014-06-07 VITALS — HR 68 | Ht 68.0 in | Wt 300.0 lb

## 2014-06-07 DIAGNOSIS — R569 Unspecified convulsions: Secondary | ICD-10-CM

## 2014-06-07 DIAGNOSIS — Q851 Tuberous sclerosis: Secondary | ICD-10-CM

## 2014-06-07 NOTE — Progress Notes (Signed)
[facility]    Provider:  Dr Jaynee Eagles Referring Provider: Ricard Dillon, MD Primary Care Physician:  Georgetta Haber, MD  CC:  Seizures  HPI:  [name] is a 58 y.o. female here as a referral from Dr. Arnoldo Morale for Seizures. Patient has a PMHx of Tuberous Sclerosis, MR,ESRD,anxiety disorder,schizoaffectivie disorder, depressive type, OSA on cpap, complete AV block,CHF, hypotension of HD, COPD, Gerd, pacemaker, refractory severe depression/psychosis. She is a poor historian and caretaker does not have any additional history, is just accompanying her to appointment. Patient reports she is here for seizures. They come "every now and then when people upset me or do things to me like when they don't believe me".  She goes to dialysis and has multiple seizures there and needs to get off the machine to get it straightened out. She stares at people when she has the seizure, she doesn't talk. She is on keppra for the seizures and is also on Topamax. She has had seizures since she was 58 years old. They last for 5-10 minutes. She shakes sometimes. She remembers everything that happens when she has the seizures, her eyes "give me a signal that I am having one". She can hear people talking to her but she doesn' t respond. It only happens "when people upset me about certain things". Sometimes she will have them up to 3x a week and afterwards she feels dizzy and lightheaded. Denies confusion after the seizures or feeling sleepy/tired. No urinary incontinence or tongue biting.   Reviewed notes, labs and imaging from outside physicians, which showed: Patient is on Keppra 1000mg  bid days not on dialysis, 1500mg  post HD 3x a week, lamictal 100mg  qhs, topamx 200mg  bid, xanax 0.5mg  bid, primidone 125mg  qhs,  She is also on eliquis 2.5mg bid, risperdal,sertraline 100mg , norco and tramadol for pain prn  Notes from her residence state that resident is an attention seeker, her thought process  is not always logical, she makes statements that are untrue, resident "screams and yells when things do not go her way"   CT of the head 07/31/2011:  Findings: Beam hardening artifacts at skull base from shoulders. Normal ventricular morphology. No midline shift or mass effect. Subependymal calcifications at lateral ventricles consistent with tuberous sclerosis. No intracranial hemorrhage, mass lesion or evidence of acute infarction. No definite extra-axial fluid collections. Hyperostosis frontalis interna. Visualized paranasal sinuses and mastoid air cells clear.  IMPRESSION: No acute intracranial abnormalities. 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  Review of Systems: Patient complains of symptoms per HPI as well as the following symptoms blurred vision, chest pain, SOB, numbness, weakness, passing out, blood in stool, diarrhea, constipation, note enough sleep, hallucinations. Pertinent negatives per HPI. All others negative.   History   Social History  . Marital Status: Single    Spouse Name: N/A    Number of Children: 0  . Years of Education: 10   Occupational History  . lives in group home    Social History Main Topics  . Smoking status: Never Smoker   . Smokeless tobacco: Never Used  . Alcohol Use: No  . Drug Use: No  . Sexual Activity: No   Other Topics Concern  . Not on file   Social History Narrative   Patient stays at [facility] [place].     Family History  Problem Relation Age of Onset  . Diabetes Father   . Asthma Son     Past Medical History  Diagnosis Date  .  Renal insufficiency   . Mental retardation   . Sleep apnea   . End-stage renal disease on hemodialysis   . Tuberous sclerosis   . Obesity   . GERD (gastroesophageal reflux disease)   . Depression   . Mental retardation   . Sleep apnea     on c-pap  . Hx MRSA infection   . Obesity   . Complication of anesthesia     "sometimes I don't wake  up on time; depends on how much med"  . Asthma   . 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 Osteoarthritis   . Angina   . CHF (congestive heart failure)   . Shortness of breath     "all the time  . Pneumonia ? 2006  . Renal failure   . ESRD (end stage renal disease) on dialysis 05/25/11    "monday;wed;fri; last time was 05/25/11; usually @ [facility].""  . Blood transfusion   . Anemia   . Stomach ulcer   . Constipated   . Seizures     last seizure 05/21/11    Past Surgical History  Procedure Laterality Date  . Uterine tumor  05/25/11    pt denies this history  . Nephrectomy  01/08/1994; 01/15/2006    left; right  . Shunt in arm for dialysis      right   . Thrombectomy  05/2010    shunt in my right arm  . Cholecystectomy  07/02/1990  . Tonsillectomy  03/03/1978  . Avgg removal  06-26-11    Right arm AVG removed.    Current Outpatient Prescriptions  Medication Sig Dispense Refill  . albuterol-ipratropium (COMBIVENT) 18-103 MCG/ACT inhaler Inhale 2 puffs into the lungs every 4 (four) hours as needed. As needed for asthma     . ALPRAZolam (XANAX) 0.25 MG tablet Take by mouth.    . ALPRAZolam (XANAX) 0.5 MG tablet     . ALPRAZolam (XANAX) 1 MG tablet     . b complex-vitamin c-folic acid (NEPHRO-VITE) 0.8 MG TABS Take 0.8 mg by mouth at bedtime.      . celecoxib (CELEBREX) 200 MG capsule Take 200 mg by mouth 2 (two) times daily as needed. For pain    . clonazePAM (KLONOPIN) 0.5 MG tablet Take 0.5 mg by mouth. One on days of dialysis MWF    . cyclobenzaprine (FLEXERIL) 10 MG tablet Take 10 mg by mouth 2 (two) times daily.      Marland Kitchen doxycycline (VIBRAMYCIN) 100 MG capsule     . DULoxetine (CYMBALTA) 30 MG capsule Take 30 mg by mouth 2 (two) times daily.     Marland Kitchen ELIQUIS 2.5 MG TABS tablet     . esomeprazole (NEXIUM) 40 MG capsule Take 40 mg by mouth daily before breakfast.      . fexofenadine (ALLEGRA) 180 MG tablet Take 180 mg by mouth daily as needed. For allergies    . fludrocortisone (FLORINEF) 0.1  MG tablet     . fluticasone (FLONASE) 50 MCG/ACT nasal spray Place 2 sprays into the nose daily. 16 g 11  . fluticasone (FLONASE) 50 MCG/ACT nasal spray Place 2 sprays into the nose daily. 16 g 11  . Fluticasone-Salmeterol (ADVAIR DISKUS) 250-50 MCG/DOSE AEPB Inhale 1 puff into the lungs every 12 (twelve) hours.      Marland Kitchen HYDROcodone-acetaminophen (NORCO/VICODIN) 5-325 MG per tablet Take by mouth.    . hydrocortisone-pramoxine (ANALPRAM-HC) 2.5-1 % rectal cream Place rectally 2 (two) times daily.      Marland Kitchen iron  polysaccharides (NIFEREX) 150 MG capsule Take 150 mg by mouth. Alternates with ferri-seque every day     . ketoconazole (NIZORAL) 2 % cream Apply topically 3 (three) times daily as needed. For rash    . lamoTRIgine (LAMICTAL) 100 MG tablet     . lamoTRIgine (LAMICTAL) 25 MG tablet     . levETIRAcetam (KEPPRA) 1000 MG tablet     . midodrine (PROAMATINE) 10 MG tablet Take 10 mg by mouth 3 (three) times daily.      . mupirocin (BACTROBAN) 2 % ointment Apply topically 2 (two) times daily.      . ondansetron (ZOFRAN) 4 MG tablet Take 4 mg by mouth every 8 (eight) hours as needed. For nausea    . pantoprazole (PROTONIX) 40 MG tablet     . primidone (MYSOLINE) 250 MG tablet Take 500 mg by mouth 2 (two) times daily.     . QUEtiapine (SEROQUEL) 25 MG tablet Take by mouth.    . RESTASIS 0.05 % ophthalmic emulsion     . risperiDONE (RISPERDAL) 0.5 MG tablet     . sertraline (ZOLOFT) 100 MG tablet     . sevelamer (RENAGEL) 800 MG tablet Take 1,600 mg by mouth 3 (three) times daily with meals.     . SYMBICORT 160-4.5 MCG/ACT inhaler     . topiramate (TOPAMAX) 200 MG tablet Take 200 mg by mouth 2 (two) times daily.      Marland Kitchen triamcinolone (KENALOG) 0.025 % cream     . pantoprazole (PROTONIX) 40 MG tablet Take 1 tablet (40 mg total) by mouth daily. 30 tablet 6   No current facility-administered medications for this visit.    Allergies as of 06/07/2014 - Review Complete 06/07/2014  Allergen Reaction  Noted  . Iodinated diagnostic agents Hives 05/21/2011  . Iodine  06/07/2014  . Methylprednisolone Hives   . Other Itching 06/10/2011  . Pollen extract Hives 06/07/2014  . Trimethadione  06/07/2014    Vitals: Patient declined BP, pulse BP   Pulse 68  Ht 5\' 8"  (1.727 m)  Wt 300 lb (136.079 kg)  BMI 45.63 kg/m2 Last Weight:  Wt Readings from Last 1 Encounters:  06/07/14 300 lb (136.079 kg)   Last Height:   Ht Readings from Last 1 Encounters:  06/07/14 5\' 8"  (1.727 m)    Physical exam: Exam: Gen: NAD,  Morbidly Obese, angiofibromas        CV: RRR, no carotid bruits appreciated Eyes: Conjunctivae clear without exudates or hemorrhage  Neuro: Detailed Neurologic Exam  Speech:    No aphasia os dysarthria Cognition:    The patient is oriented to person, place, and time;     Impaired recent and remote memory, impaired fund of knowledge;     language fluent;     normal attention, concentration. Cranial Nerves:    The pupils are equal, round, and reactive to light. The fundi are flat. Visual fields are full to finger confrontation. Extraocular movements are intact. Trigeminal sensation is intact and the muscles of mastication are normal. The face is symmetric. The palate elevates in the midline. Voice is normal. Shoulder shrug is normal. The tongue has normal motion without fasciculations.   Coordination:    Normal finger to nose, can't do HTS due to large body habitus   Gait:    In wheelchair  Motor Observation:    No asymmetry, no atrophy, and no involuntary movements noted. Tone:    Normal muscle tone.    Posture:  Posture is erect in the wheelchair    Strength:    Strength is intact in the upper and lower limbs with some giveway     Sensation:     Intact to LT Reflex Exam:  DTR's:    Absent lowers, 1+ uppers.   Toes:    The toes are downgoing bilaterally.   Clonus:    Clonus is absent.      Assessment/Plan:   Patient is here to establish care for  seizures. Patient has a PMHx of Tuberous Sclerosis, MR,ESRD,anxiety disorder,schizoaffectivie disorder, depressive type, OSA on cpap, complete AV block,CHF, hypotension of HD, COPD, Gerd, pacemaker, refractory severe depression/psychosis. She is a poor historian and caretaker does not have any additional history, is just accompanying her to appointment.  She is on Keppra and Topamax. Patient is a poor historian and caretaker does not have any additional history, is just accompanying her to appointment. Patient reports her seizures come "every now and then when people upset me or do things to me like when they don't believe me". Epilepsy is common in Tuberous sclerosis, however her description of the events is suspicious for a behavioral aspect. Notes from her residence state that resident is an attention seeker, her thought process is not always logical, she makes statements that are untrue, resident "screams and yells when things do not go her way". Unfortunately her caretaker has no information and does not know patient well. Would like more information about her current seizure events and can possibly perform an EEG as well. Per patient and person accompanying her, Ms. Melvyn Neth  at [phone] would be the best contact. Until EEG results and I speak with Ms. Harris, will keep patient on current dose of anti-epileptic medications.  For management of Tuberous Sclerosis: Brain imaging should be obtained every several years for patients with TSC, to monitor for the development of subependymal giant cell tumors.  neuropsychiatric disorders are common and should be managed appropriately Skin lesions are common in TS but do not require treatment. Follow up with dermatology for skin lesions that are suspicious, that rapidly change in size or number or are painful. Recommend annual dermatologic exam.  Yearly dental follow up Close follow up for patient's renal,pulmonary and cardiac disease; common in  [place] Annual dilated eye exams with ophthalmology.  Follow up every 3-4 months. Patient can be seen by nurse practitioner if stable. Will Follow up with Ms. Melvyn Neth  at [phone].  Sarina Ill, MD  [facility] [address] [place], [place] [address]  Phone [phone] Fax [phone]

## 2014-06-07 NOTE — Patient Instructions (Addendum)
Overall you are doing fairly well but I do want to suggest a few things today:   Remember to drink plenty of fluid, eat healthy meals and do not skip any meals. Try to eat protein with a every meal and eat a healthy snack such as fruit or nuts in between meals. Try to keep a regular sleep-wake schedule and try to exercise daily, particularly in the form of walking, 20-30 minutes a day, if you can. Use walking aids to avoid falls and be supervised,   As far as your medications are concerned, I would like to suggest: continue keppra and topamax as prescribed currrently  As far as diagnostic testing: EEG  I would like to see you back in 3 months for follow up, EEG in 2-4 weeks. Please call us with any interim questions, concerns, problems, updates or refill requests.   Please also call us for any test results so we can go over those with you on the phone.  My clinical assistant and will answer any of your questions and relay your messages to me and also relay most of my messages to you.   Our phone number is [phone]. We also have an after hours call service for urgent matters and there is a physician on-call for urgent questions. For any emergencies you know to call 911 or go to the nearest emergency room

## 2014-06-11 ENCOUNTER — Encounter: Payer: Self-pay | Admitting: Neurology

## 2014-06-14 IMAGING — CR DG CHEST 2V
1 series · 3 of 3 positions shown · non-contrast
Comparison: none

REASON FOR EXAM: sob
COMMENTS:

[Series 1: x chest ap · 0.14mm/px · 3 of 3 slices shown]
[im 1/3]
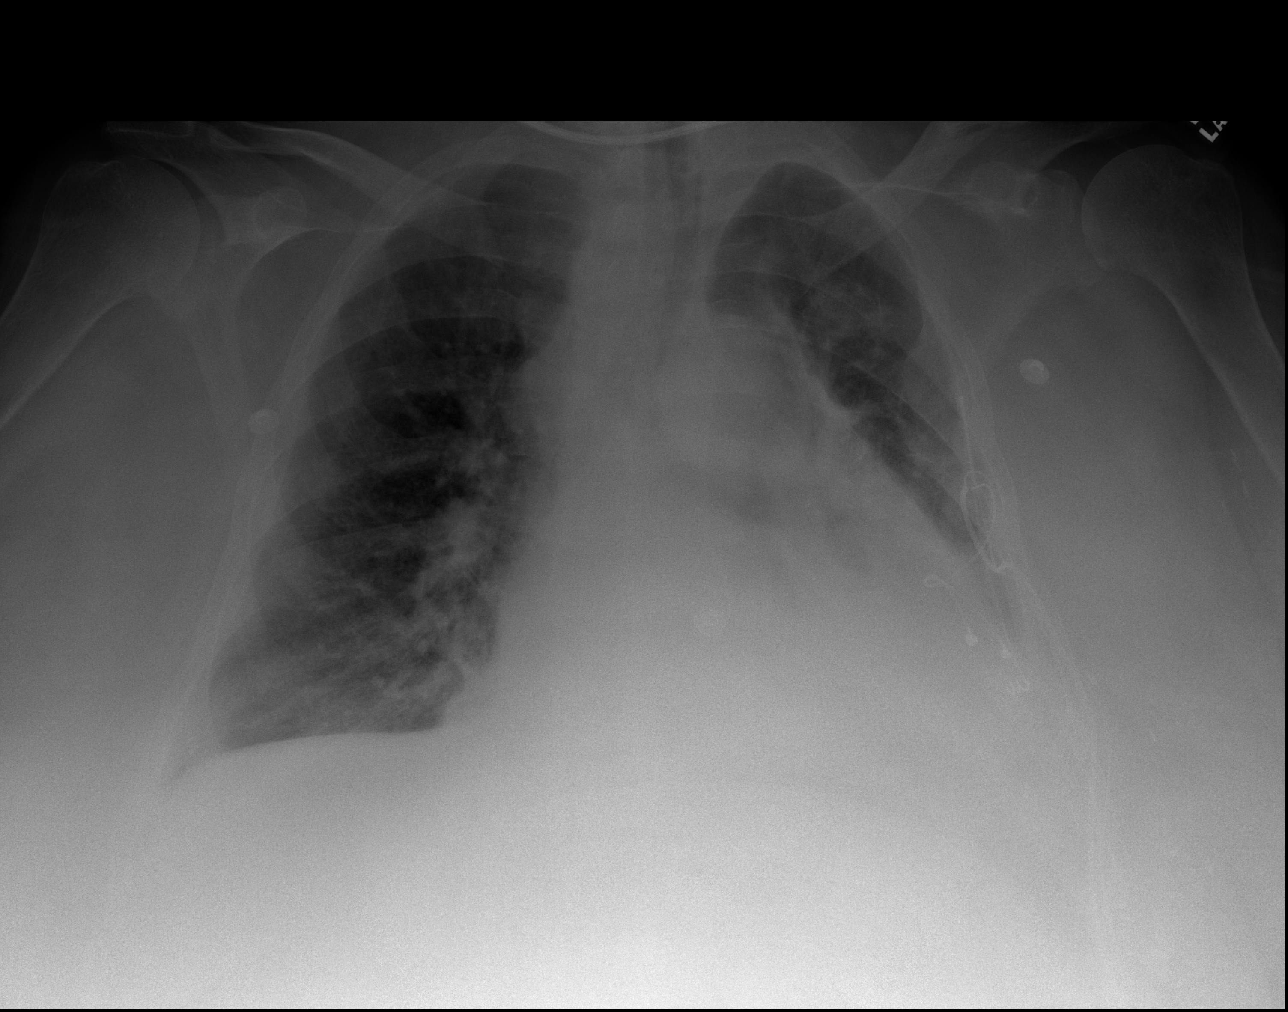
[im 2/3]
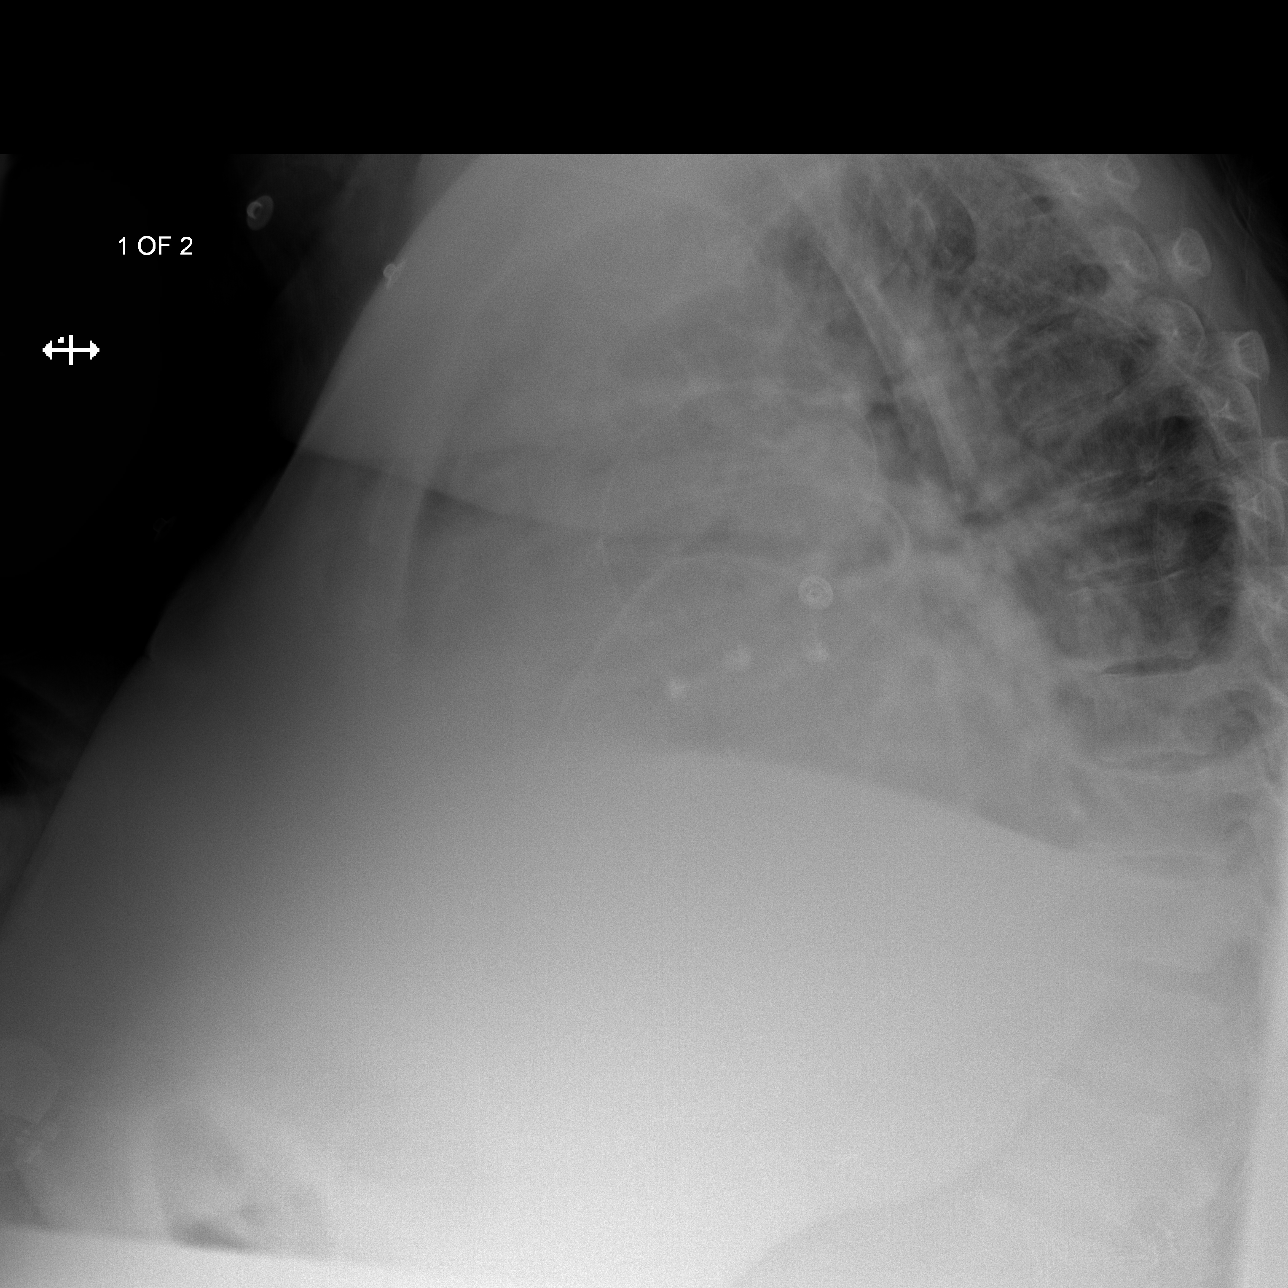
[im 3/3]
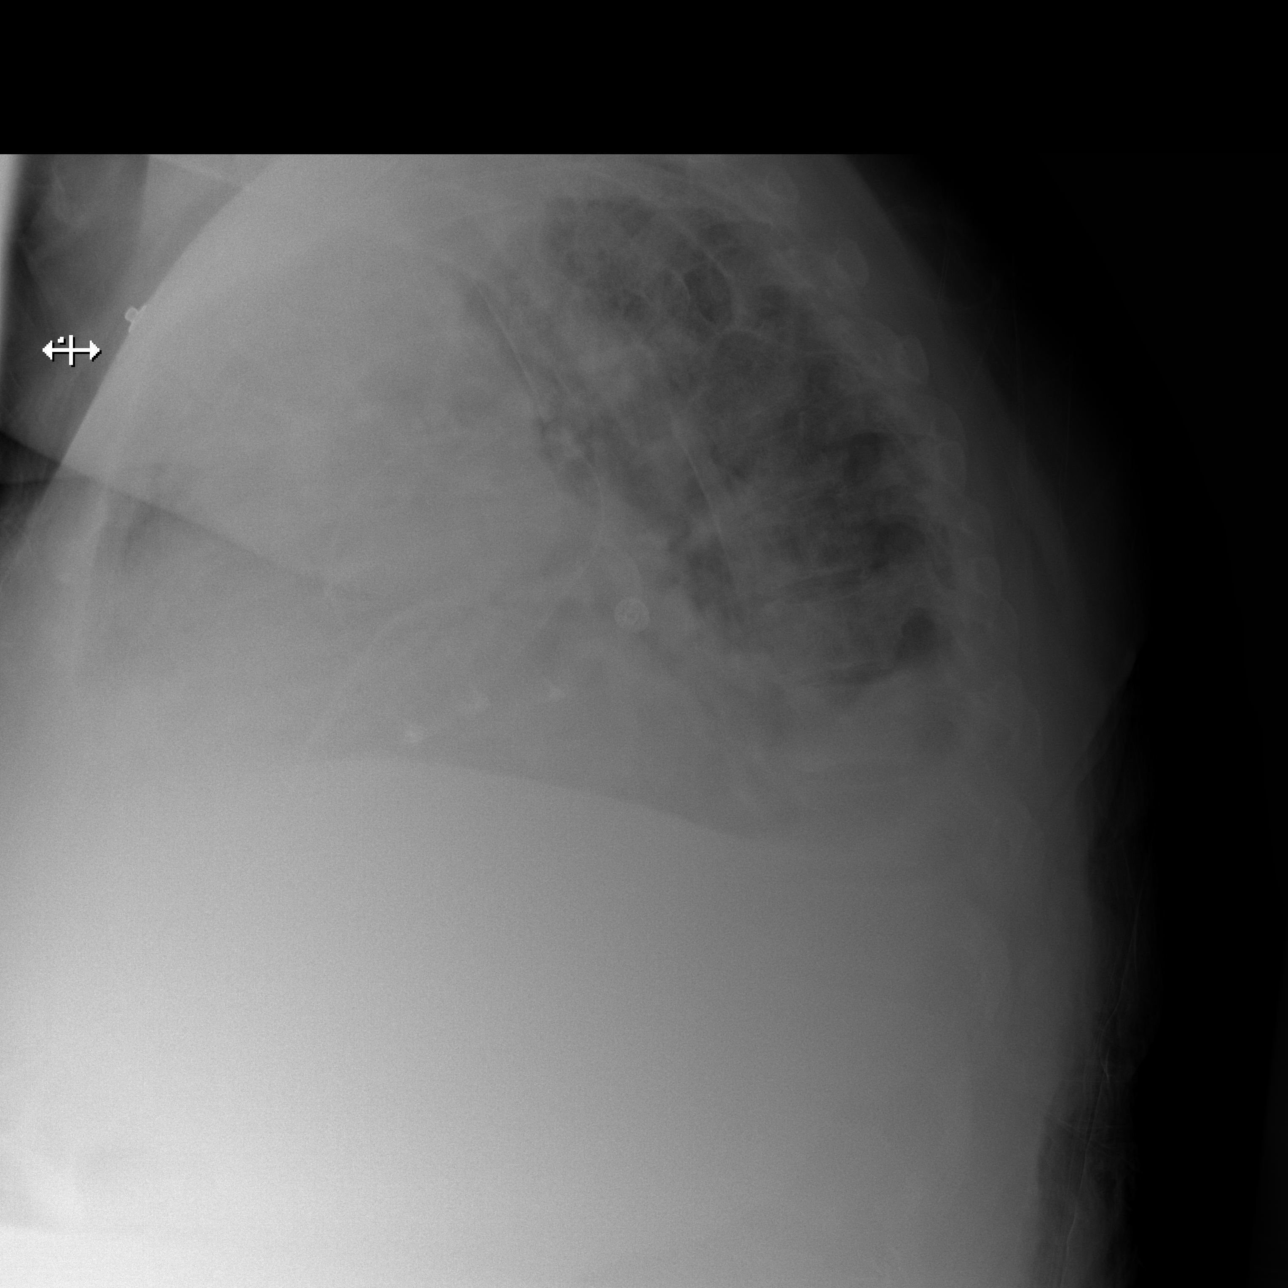

[3 of 3 positions shown; findings below may reference images not displayed]

PROCEDURE:     DXR - DXR CHEST PA (OR AP) AND LATERAL  - June 13, 2012  [DATE]

RESULT:     Comparison is made to a previous study 24 May, 2012.

The lungs are adequately inflated. The left hemidiaphragm is nearly
obscured. The cardiac silhouette remains enlarged. The pulmonary vascularity
is less prominent today but remains engorged. On the lateral film pleural
fluid layers posteriorly on the left. There are metallic wires demonstrated
over the lateral aspect of the left lower hemithorax.
IMPRESSION: The findings are consistent with congestive heart failure
with pulmonary interstitial edema and left pleural effusion. The appearance
of the lungs has improved somewhat since the previous study but significant
abnormality remains.

[REDACTED]

## 2014-06-25 ENCOUNTER — Ambulatory Visit: Payer: Self-pay | Admitting: Unknown Physician Specialty

## 2014-06-25 ENCOUNTER — Ambulatory Visit: Payer: Self-pay

## 2014-06-25 LAB — IRON AND TIBC
Iron Bind.Cap.(Total): 233 ug/dL — ABNORMAL LOW (ref 250–450)
Iron Saturation: 28 %
Iron: 65 ug/dL (ref 50–170)
Unbound Iron-Bind.Cap.: 168 ug/dL

## 2014-06-25 LAB — CBC WITH DIFFERENTIAL/PLATELET
BASOS PCT: 0.7 %
Basophil #: 0 10*3/uL (ref 0.0–0.1)
EOS PCT: 1.8 %
Eosinophil #: 0.1 10*3/uL (ref 0.0–0.7)
HCT: 33.3 % — ABNORMAL LOW (ref 35.0–47.0)
HGB: 10.3 g/dL — ABNORMAL LOW (ref 12.0–16.0)
Lymphocyte #: 1.3 10*3/uL (ref 1.0–3.6)
Lymphocyte %: 24.6 %
MCH: 34.7 pg — AB (ref 26.0–34.0)
MCHC: 31 g/dL — ABNORMAL LOW (ref 32.0–36.0)
MCV: 112 fL — ABNORMAL HIGH (ref 80–100)
Monocyte #: 0.3 x10 3/mm (ref 0.2–0.9)
Monocyte %: 6.1 %
NEUTROS ABS: 3.5 10*3/uL (ref 1.4–6.5)
Neutrophil %: 66.8 %
PLATELETS: 160 10*3/uL (ref 150–440)
RBC: 2.97 10*6/uL — AB (ref 3.80–5.20)
RDW: 19.1 % — AB (ref 11.5–14.5)
WBC: 5.2 10*3/uL (ref 3.6–11.0)

## 2014-06-29 LAB — BASIC METABOLIC PANEL
Anion Gap: 14 (ref 7–16)
BUN: 21 mg/dL — ABNORMAL HIGH (ref 7–18)
Calcium, Total: 9.1 mg/dL (ref 8.5–10.1)
Chloride: 96 mmol/L — ABNORMAL LOW (ref 98–107)
Co2: 28 mmol/L (ref 21–32)
Creatinine: 3.92 mg/dL — ABNORMAL HIGH (ref 0.60–1.30)
EGFR (Non-African Amer.): 13 — ABNORMAL LOW
GFR CALC AF AMER: 15 — AB
Glucose: 122 mg/dL — ABNORMAL HIGH (ref 65–99)
Osmolality: 280 (ref 275–301)
Potassium: 3.8 mmol/L (ref 3.5–5.1)
Sodium: 138 mmol/L (ref 136–145)

## 2014-06-29 LAB — CBC WITH DIFFERENTIAL/PLATELET
BASOS ABS: 0 10*3/uL (ref 0.0–0.1)
BASOS PCT: 0.3 %
Eosinophil #: 0 10*3/uL (ref 0.0–0.7)
Eosinophil %: 0.1 %
HCT: 35.5 % (ref 35.0–47.0)
HGB: 11 g/dL — AB (ref 12.0–16.0)
LYMPHS ABS: 0.8 10*3/uL — AB (ref 1.0–3.6)
Lymphocyte %: 9 %
MCH: 34.6 pg — AB (ref 26.0–34.0)
MCHC: 31 g/dL — ABNORMAL LOW (ref 32.0–36.0)
MCV: 112 fL — ABNORMAL HIGH (ref 80–100)
Monocyte #: 0.4 x10 3/mm (ref 0.2–0.9)
Monocyte %: 4.6 %
Neutrophil #: 7.6 10*3/uL — ABNORMAL HIGH (ref 1.4–6.5)
Neutrophil %: 86 %
PLATELETS: 177 10*3/uL (ref 150–440)
RBC: 3.19 10*6/uL — ABNORMAL LOW (ref 3.80–5.20)
RDW: 18.9 % — AB (ref 11.5–14.5)
WBC: 8.8 10*3/uL (ref 3.6–11.0)

## 2014-06-29 LAB — TROPONIN I: Troponin-I: 0.03 ng/mL

## 2014-06-30 ENCOUNTER — Inpatient Hospital Stay: Payer: Self-pay | Admitting: Internal Medicine

## 2014-06-30 LAB — CBC WITH DIFFERENTIAL/PLATELET
BASOS ABS: 0 10*3/uL (ref 0.0–0.1)
Basophil %: 0.3 %
EOS ABS: 0 10*3/uL (ref 0.0–0.7)
Eosinophil %: 0.1 %
HCT: 29.8 % — ABNORMAL LOW (ref 35.0–47.0)
HGB: 9.3 g/dL — ABNORMAL LOW (ref 12.0–16.0)
LYMPHS PCT: 7.3 %
Lymphocyte #: 0.5 10*3/uL — ABNORMAL LOW (ref 1.0–3.6)
MCH: 34.8 pg — AB (ref 26.0–34.0)
MCHC: 31.3 g/dL — ABNORMAL LOW (ref 32.0–36.0)
MCV: 111 fL — AB (ref 80–100)
MONOS PCT: 2.5 %
Monocyte #: 0.2 x10 3/mm (ref 0.2–0.9)
NEUTROS ABS: 5.8 10*3/uL (ref 1.4–6.5)
NEUTROS PCT: 89.8 %
Platelet: 135 10*3/uL — ABNORMAL LOW (ref 150–440)
RBC: 2.68 10*6/uL — AB (ref 3.80–5.20)
RDW: 18.6 % — AB (ref 11.5–14.5)
WBC: 6.4 10*3/uL (ref 3.6–11.0)

## 2014-06-30 LAB — BASIC METABOLIC PANEL
ANION GAP: 7 (ref 7–16)
BUN: 31 mg/dL — ABNORMAL HIGH (ref 7–18)
CALCIUM: 8.8 mg/dL (ref 8.5–10.1)
CHLORIDE: 103 mmol/L (ref 98–107)
CO2: 28 mmol/L (ref 21–32)
CREATININE: 4.96 mg/dL — AB (ref 0.60–1.30)
GFR CALC AF AMER: 12 — AB
GFR CALC NON AF AMER: 10 — AB
Glucose: 98 mg/dL (ref 65–99)
Osmolality: 282 (ref 275–301)
POTASSIUM: 4.5 mmol/L (ref 3.5–5.1)
Sodium: 138 mmol/L (ref 136–145)

## 2014-07-01 LAB — PHOSPHORUS: PHOSPHORUS: 3.8 mg/dL (ref 2.5–4.9)

## 2014-07-07 ENCOUNTER — Telehealth: Payer: Self-pay | Admitting: Radiology

## 2014-07-07 ENCOUNTER — Other Ambulatory Visit: Payer: Medicare Other | Admitting: Radiology

## 2014-07-07 NOTE — Telephone Encounter (Signed)
No show for EEG on 07/07/14.

## 2014-07-14 ENCOUNTER — Telehealth: Payer: Self-pay | Admitting: *Deleted

## 2014-07-15 ENCOUNTER — Encounter: Payer: Self-pay | Admitting: Neurology

## 2014-07-22 NOTE — Telephone Encounter (Signed)
done

## 2014-07-30 ENCOUNTER — Ambulatory Visit (INDEPENDENT_AMBULATORY_CARE_PROVIDER_SITE_OTHER): Payer: Medicare Other | Admitting: Neurology

## 2014-07-30 DIAGNOSIS — Q851 Tuberous sclerosis: Secondary | ICD-10-CM

## 2014-07-30 DIAGNOSIS — R569 Unspecified convulsions: Secondary | ICD-10-CM

## 2014-07-30 NOTE — Procedures (Signed)
    History:  [name] is a 59 year old patient with a history of tuberous sclerosis, schizoaffective disorder, depression, sleep apnea, end-stage renal disease, and congestive heart failure. The patient has a history of seizures that occur frequently during hemodialysis. The patient indicates that the seizure events occur when she is upset. The patient being evaluated for these episodes.  This is a routine EEG. Oh skull defects are noted. Medications include alprazolam, Celebrex, Cymbalta, Eliquis, Nexium, Florinef, Flonase, Lamictal, Keppra, midodrine, Protonix, Mysoline, Seroquel, risperidone, and Zoloft.   EEG classification: Essentially normal awake and drowsy  Description of the recording: The background rhythms of this recording consists of a slightly disorganized medium amplitude alpha rhythm of 8 Hz that is reactive to eye opening and closure. As the record progresses, the patient appears to remain in the waking state throughout the recording. Photic stimulation was performed, resulting in a bilateral and symmetric photic driving response. Hyperventilation was not performed. Toward the end of the recording, the patient enters the drowsy state with slight symmetric slowing seen. The patient never enters stage II sleep. At no time during the recording does there appear to be evidence of spike or spike wave discharges or evidence of focal slowing. EKG monitor shows no evidence of cardiac rhythm abnormalities with a heart rate of 60.  Impression: This is an essentially normal EEG recording in the waking and drowsy state. No evidence of ictal or interictal discharges are seen.

## 2014-08-04 ENCOUNTER — Telehealth: Payer: Self-pay | Admitting: Neurology

## 2014-08-04 NOTE — Telephone Encounter (Signed)
Spoke with nurse where [facility] Caul resides. Let them know that EEG is normal. Spoke to [place]. Will fax report to [phone].

## 2014-08-15 ENCOUNTER — Emergency Department: Payer: Self-pay | Admitting: Emergency Medicine

## 2014-08-15 LAB — CBC
HCT: 29 % — ABNORMAL LOW (ref 35.0–47.0)
HGB: 8.9 g/dL — ABNORMAL LOW (ref 12.0–16.0)
MCH: 33.9 pg (ref 26.0–34.0)
MCHC: 30.9 g/dL — AB (ref 32.0–36.0)
MCV: 110 fL — AB (ref 80–100)
PLATELETS: 171 10*3/uL (ref 150–440)
RBC: 2.64 10*6/uL — AB (ref 3.80–5.20)
RDW: 17.5 % — AB (ref 11.5–14.5)
WBC: 6.7 10*3/uL (ref 3.6–11.0)

## 2014-08-15 LAB — COMPREHENSIVE METABOLIC PANEL
ALT: 15 U/L (ref 14–63)
ANION GAP: 10 (ref 7–16)
Albumin: 3.3 g/dL — ABNORMAL LOW (ref 3.4–5.0)
Alkaline Phosphatase: 313 U/L — ABNORMAL HIGH (ref 46–116)
BILIRUBIN TOTAL: 0.6 mg/dL (ref 0.2–1.0)
BUN: 32 mg/dL — ABNORMAL HIGH (ref 7–18)
CHLORIDE: 101 mmol/L (ref 98–107)
CO2: 29 mmol/L (ref 21–32)
CREATININE: 4.49 mg/dL — AB (ref 0.60–1.30)
Calcium, Total: 9 mg/dL (ref 8.5–10.1)
GFR CALC AF AMER: 13 — AB
GFR CALC NON AF AMER: 11 — AB
Glucose: 90 mg/dL (ref 65–99)
OSMOLALITY: 286 (ref 275–301)
POTASSIUM: 4.1 mmol/L (ref 3.5–5.1)
SGOT(AST): 25 U/L (ref 15–37)
Sodium: 140 mmol/L (ref 136–145)
TOTAL PROTEIN: 6.9 g/dL (ref 6.4–8.2)

## 2014-09-06 ENCOUNTER — Ambulatory Visit: Payer: Self-pay | Admitting: Vascular Surgery

## 2014-09-08 ENCOUNTER — Ambulatory Visit (INDEPENDENT_AMBULATORY_CARE_PROVIDER_SITE_OTHER): Payer: Medicare Other | Admitting: Neurology

## 2014-09-08 ENCOUNTER — Inpatient Hospital Stay (HOSPITAL_COMMUNITY): Payer: Medicare Other

## 2014-09-08 ENCOUNTER — Encounter: Payer: Self-pay | Admitting: Neurology

## 2014-09-08 ENCOUNTER — Encounter (HOSPITAL_COMMUNITY): Payer: Self-pay | Admitting: Emergency Medicine

## 2014-09-08 ENCOUNTER — Emergency Department (HOSPITAL_COMMUNITY): Payer: Medicare Other

## 2014-09-08 ENCOUNTER — Inpatient Hospital Stay (HOSPITAL_COMMUNITY)
Admission: EM | Admit: 2014-09-08 | Discharge: 2014-09-14 | DRG: 312 | Disposition: A | Discharge status: Skilled Nursing Facility | Payer: Medicare Other | Attending: Internal Medicine | Admitting: Internal Medicine

## 2014-09-08 ENCOUNTER — Other Ambulatory Visit (HOSPITAL_COMMUNITY): Payer: Self-pay

## 2014-09-08 VITALS — BP 60/38 | HR 60 | Ht 67.0 in | Wt 294.0 lb

## 2014-09-08 DIAGNOSIS — Z992 Dependence on renal dialysis: Secondary | ICD-10-CM | POA: Diagnosis not present

## 2014-09-08 DIAGNOSIS — R55 Syncope and collapse: Secondary | ICD-10-CM | POA: Diagnosis present

## 2014-09-08 DIAGNOSIS — A419 Sepsis, unspecified organism: Secondary | ICD-10-CM

## 2014-09-08 DIAGNOSIS — D62 Acute posthemorrhagic anemia: Secondary | ICD-10-CM | POA: Diagnosis present

## 2014-09-08 DIAGNOSIS — F209 Schizophrenia, unspecified: Secondary | ICD-10-CM | POA: Diagnosis present

## 2014-09-08 DIAGNOSIS — I509 Heart failure, unspecified: Secondary | ICD-10-CM | POA: Diagnosis present

## 2014-09-08 DIAGNOSIS — F79 Unspecified intellectual disabilities: Secondary | ICD-10-CM | POA: Diagnosis present

## 2014-09-08 DIAGNOSIS — F419 Anxiety disorder, unspecified: Secondary | ICD-10-CM | POA: Diagnosis present

## 2014-09-08 DIAGNOSIS — G4733 Obstructive sleep apnea (adult) (pediatric): Secondary | ICD-10-CM | POA: Diagnosis present

## 2014-09-08 DIAGNOSIS — Q851 Tuberous sclerosis: Secondary | ICD-10-CM

## 2014-09-08 DIAGNOSIS — N186 End stage renal disease: Secondary | ICD-10-CM | POA: Diagnosis present

## 2014-09-08 DIAGNOSIS — Z6841 Body Mass Index (BMI) 40.0 and over, adult: Secondary | ICD-10-CM | POA: Diagnosis not present

## 2014-09-08 DIAGNOSIS — R296 Repeated falls: Secondary | ICD-10-CM | POA: Diagnosis present

## 2014-09-08 DIAGNOSIS — E039 Hypothyroidism, unspecified: Secondary | ICD-10-CM | POA: Diagnosis present

## 2014-09-08 DIAGNOSIS — F329 Major depressive disorder, single episode, unspecified: Secondary | ICD-10-CM | POA: Diagnosis present

## 2014-09-08 DIAGNOSIS — I442 Atrioventricular block, complete: Secondary | ICD-10-CM | POA: Diagnosis present

## 2014-09-08 DIAGNOSIS — R001 Bradycardia, unspecified: Secondary | ICD-10-CM | POA: Diagnosis present

## 2014-09-08 DIAGNOSIS — G40409 Other generalized epilepsy and epileptic syndromes, not intractable, without status epilepticus: Secondary | ICD-10-CM | POA: Diagnosis present

## 2014-09-08 DIAGNOSIS — K219 Gastro-esophageal reflux disease without esophagitis: Secondary | ICD-10-CM | POA: Diagnosis present

## 2014-09-08 DIAGNOSIS — K625 Hemorrhage of anus and rectum: Secondary | ICD-10-CM

## 2014-09-08 DIAGNOSIS — I959 Hypotension, unspecified: Secondary | ICD-10-CM | POA: Diagnosis present

## 2014-09-08 DIAGNOSIS — R569 Unspecified convulsions: Secondary | ICD-10-CM

## 2014-09-08 DIAGNOSIS — I953 Hypotension of hemodialysis: Secondary | ICD-10-CM | POA: Diagnosis present

## 2014-09-08 DIAGNOSIS — I120 Hypertensive chronic kidney disease with stage 5 chronic kidney disease or end stage renal disease: Secondary | ICD-10-CM | POA: Diagnosis present

## 2014-09-08 DIAGNOSIS — K649 Unspecified hemorrhoids: Secondary | ICD-10-CM | POA: Diagnosis present

## 2014-09-08 DIAGNOSIS — Z7901 Long term (current) use of anticoagulants: Secondary | ICD-10-CM | POA: Diagnosis not present

## 2014-09-08 DIAGNOSIS — E038 Other specified hypothyroidism: Secondary | ICD-10-CM | POA: Diagnosis present

## 2014-09-08 DIAGNOSIS — D649 Anemia, unspecified: Secondary | ICD-10-CM | POA: Diagnosis present

## 2014-09-08 DIAGNOSIS — K922 Gastrointestinal hemorrhage, unspecified: Secondary | ICD-10-CM | POA: Insufficient documentation

## 2014-09-08 DIAGNOSIS — R748 Abnormal levels of other serum enzymes: Secondary | ICD-10-CM

## 2014-09-08 DIAGNOSIS — K648 Other hemorrhoids: Secondary | ICD-10-CM | POA: Diagnosis present

## 2014-09-08 DIAGNOSIS — E860 Dehydration: Secondary | ICD-10-CM | POA: Diagnosis present

## 2014-09-08 DIAGNOSIS — I9589 Other hypotension: Secondary | ICD-10-CM | POA: Diagnosis present

## 2014-09-08 HISTORY — DX: Presence of cardiac pacemaker: Z95.0

## 2014-09-08 LAB — CBC WITH DIFFERENTIAL/PLATELET
Basophils Absolute: 0 10*3/uL (ref 0.0–0.1)
Basophils Absolute: 0 10*3/uL (ref 0.0–0.1)
Basophils Relative: 0 % (ref 0–1)
Basophils Relative: 1 % (ref 0–1)
EOS ABS: 0.1 10*3/uL (ref 0.0–0.7)
EOS PCT: 1 % (ref 0–5)
Eosinophils Absolute: 0.1 10*3/uL (ref 0.0–0.7)
Eosinophils Relative: 2 % (ref 0–5)
HCT: 28.2 % — ABNORMAL LOW (ref 36.0–46.0)
HCT: 28.9 % — ABNORMAL LOW (ref 36.0–46.0)
HEMOGLOBIN: 8.8 g/dL — AB (ref 12.0–15.0)
Hemoglobin: 8.7 g/dL — ABNORMAL LOW (ref 12.0–15.0)
LYMPHS ABS: 1.3 10*3/uL (ref 0.7–4.0)
Lymphocytes Relative: 22 % (ref 12–46)
Lymphocytes Relative: 31 % (ref 12–46)
Lymphs Abs: 1.1 10*3/uL (ref 0.7–4.0)
MCH: 32.8 pg (ref 26.0–34.0)
MCH: 33 pg (ref 26.0–34.0)
MCHC: 30.9 g/dL (ref 30.0–36.0)
MCHC: 30.4 g/dL (ref 30.0–36.0)
MCV: 106.4 fL — ABNORMAL HIGH (ref 78.0–100.0)
MCV: 108.2 fL — ABNORMAL HIGH (ref 78.0–100.0)
Monocytes Absolute: 0.3 10*3/uL (ref 0.1–1.0)
Monocytes Absolute: 0.1 10*3/uL (ref 0.1–1.0)
Monocytes Relative: 5 % (ref 3–12)
Monocytes Relative: 3 % (ref 3–12)
NEUTROS ABS: 3.6 10*3/uL (ref 1.7–7.7)
NEUTROS ABS: 2.7 10*3/uL (ref 1.7–7.7)
NEUTROS PCT: 63 % (ref 43–77)
Neutrophils Relative %: 72 % (ref 43–77)
PLATELETS: 146 10*3/uL — AB (ref 150–400)
Platelets: 152 10*3/uL (ref 150–400)
RBC: 2.65 MIL/uL — ABNORMAL LOW (ref 3.87–5.11)
RBC: 2.67 MIL/uL — AB (ref 3.87–5.11)
RDW: 17.4 % — AB (ref 11.5–15.5)
RDW: 17.5 % — ABNORMAL HIGH (ref 11.5–15.5)
WBC: 5.1 10*3/uL (ref 4.0–10.5)
WBC: 4.2 10*3/uL (ref 4.0–10.5)

## 2014-09-08 LAB — COMPREHENSIVE METABOLIC PANEL
ALBUMIN: 3.2 g/dL — AB (ref 3.5–5.2)
ALK PHOS: 277 U/L — AB (ref 39–117)
ALK PHOS: 251 U/L — AB (ref 39–117)
ALT: 10 U/L (ref 0–35)
ALT: 8 U/L (ref 0–35)
ANION GAP: 12 (ref 5–15)
AST: 19 U/L (ref 0–37)
AST: 13 U/L (ref 0–37)
Albumin: 3.5 g/dL (ref 3.5–5.2)
Anion gap: 13 (ref 5–15)
BUN: 34 mg/dL — AB (ref 6–23)
BUN: 36 mg/dL — ABNORMAL HIGH (ref 6–23)
CHLORIDE: 97 mmol/L (ref 96–112)
CO2: 28 mmol/L (ref 19–32)
CO2: 25 mmol/L (ref 19–32)
CREATININE: 5.2 mg/dL — AB (ref 0.50–1.10)
Calcium: 9.1 mg/dL (ref 8.4–10.5)
Calcium: 8.8 mg/dL (ref 8.4–10.5)
Chloride: 96 mmol/L (ref 96–112)
Creatinine, Ser: 5.45 mg/dL — ABNORMAL HIGH (ref 0.50–1.10)
GFR calc Af Amer: 9 mL/min — ABNORMAL LOW (ref >90)
GFR calc non Af Amer: 8 mL/min — ABNORMAL LOW (ref >90)
GFR calc non Af Amer: 8 mL/min — ABNORMAL LOW (ref >90)
GFR, EST AFRICAN AMERICAN: 10 mL/min — AB (ref >90)
Glucose, Bld: 97 mg/dL (ref 70–99)
Glucose, Bld: 85 mg/dL (ref 70–99)
POTASSIUM: 3.7 mmol/L (ref 3.5–5.1)
POTASSIUM: 3.9 mmol/L (ref 3.5–5.1)
Sodium: 136 mmol/L (ref 135–145)
Sodium: 135 mmol/L (ref 135–145)
TOTAL PROTEIN: 6.9 g/dL (ref 6.0–8.3)
Total Bilirubin: 1 mg/dL (ref 0.3–1.2)
Total Bilirubin: 1 mg/dL (ref 0.3–1.2)
Total Protein: 6.4 g/dL (ref 6.0–8.3)

## 2014-09-08 LAB — PROTIME-INR
INR: 1.11 (ref 0.00–1.49)
PROTHROMBIN TIME: 14.4 s (ref 11.6–15.2)

## 2014-09-08 LAB — TROPONIN I: TROPONIN I: 0.04 ng/mL — AB (ref <0.031)

## 2014-09-08 LAB — PROCALCITONIN: PROCALCITONIN: 0.7 ng/mL

## 2014-09-08 LAB — MRSA PCR SCREENING: MRSA BY PCR: POSITIVE — AB

## 2014-09-08 LAB — I-STAT CG4 LACTIC ACID, ED: Lactic Acid, Venous: 3.77 mmol/L (ref 0.5–2.0)

## 2014-09-08 LAB — POC OCCULT BLOOD, ED: Fecal Occult Bld: POSITIVE — AB

## 2014-09-08 LAB — LACTIC ACID, PLASMA: Lactic Acid, Venous: 1.4 mmol/L (ref 0.5–2.0)

## 2014-09-08 MED ORDER — LAMOTRIGINE 100 MG PO TABS
125.0000 mg | ORAL_TABLET | Freq: Two times a day (BID) | ORAL | Status: DC
  Administered 2014-09-09 – 2014-09-11 (×6): 125 mg via ORAL
  Filled 2014-09-08 (×3): qty 1
  Filled 2014-09-08: qty 5
  Filled 2014-09-08 (×3): qty 1

## 2014-09-08 MED ORDER — SERTRALINE HCL 100 MG PO TABS
100.0000 mg | ORAL_TABLET | Freq: Every day | ORAL | Status: DC
  Administered 2014-09-09 – 2014-09-10 (×3): 100 mg via ORAL
  Filled 2014-09-08 (×4): qty 1

## 2014-09-08 MED ORDER — SODIUM CHLORIDE 0.9 % IJ SOLN
3.0000 mL | Freq: Two times a day (BID) | INTRAMUSCULAR | Status: DC
  Administered 2014-09-09 – 2014-09-14 (×9): 3 mL via INTRAVENOUS

## 2014-09-08 MED ORDER — TOPIRAMATE 100 MG PO TABS
200.0000 mg | ORAL_TABLET | Freq: Two times a day (BID) | ORAL | Status: DC
  Administered 2014-09-09 – 2014-09-14 (×12): 200 mg via ORAL
  Filled 2014-09-08 (×13): qty 2

## 2014-09-08 MED ORDER — ALPRAZOLAM 0.5 MG PO TABS
0.5000 mg | ORAL_TABLET | Freq: Every day | ORAL | Status: DC
  Administered 2014-09-09 (×2): 0.5 mg via ORAL
  Filled 2014-09-08 (×2): qty 1

## 2014-09-08 MED ORDER — HYDROCORTISONE ACETATE 25 MG RE SUPP
25.0000 mg | Freq: Two times a day (BID) | RECTAL | Status: DC
  Administered 2014-09-09 – 2014-09-14 (×11): 25 mg via RECTAL
  Filled 2014-09-08 (×13): qty 1

## 2014-09-08 MED ORDER — DOCUSATE SODIUM 100 MG PO CAPS
100.0000 mg | ORAL_CAPSULE | Freq: Two times a day (BID) | ORAL | Status: DC
  Administered 2014-09-09 – 2014-09-14 (×7): 100 mg via ORAL
  Filled 2014-09-08 (×13): qty 1

## 2014-09-08 MED ORDER — DULOXETINE HCL 30 MG PO CPEP
30.0000 mg | ORAL_CAPSULE | Freq: Two times a day (BID) | ORAL | Status: DC
  Administered 2014-09-09 – 2014-09-11 (×6): 30 mg via ORAL
  Filled 2014-09-08 (×7): qty 1

## 2014-09-08 MED ORDER — HEPARIN SODIUM (PORCINE) 5000 UNIT/ML IJ SOLN
5000.0000 [IU] | Freq: Three times a day (TID) | INTRAMUSCULAR | Status: DC
  Administered 2014-09-09 – 2014-09-14 (×16): 5000 [IU] via SUBCUTANEOUS
  Filled 2014-09-08 (×20): qty 1

## 2014-09-08 MED ORDER — ALPRAZOLAM 0.25 MG PO TABS
0.2500 mg | ORAL_TABLET | ORAL | Status: DC
Start: 2014-09-09 — End: 2014-09-14
  Administered 2014-09-09 – 2014-09-14 (×3): 0.25 mg via ORAL
  Filled 2014-09-08 (×3): qty 1

## 2014-09-08 MED ORDER — ONDANSETRON HCL 4 MG PO TABS: 4.0000 mg | ORAL_TABLET | Freq: Three times a day (TID) | ORAL | Status: DC | PRN

## 2014-09-08 MED ORDER — CYCLOSPORINE 0.05 % OP EMUL
1.0000 [drp] | Freq: Two times a day (BID) | OPHTHALMIC | Status: DC
  Administered 2014-09-09 – 2014-09-14 (×11): 1 [drp] via OPHTHALMIC
  Filled 2014-09-08 (×14): qty 1

## 2014-09-08 MED ORDER — HYDROCODONE-ACETAMINOPHEN 5-325 MG PO TABS: 1.0000 | ORAL_TABLET | ORAL | Status: DC | PRN

## 2014-09-08 MED ORDER — PANTOPRAZOLE SODIUM 40 MG PO TBEC: 40.0000 mg | DELAYED_RELEASE_TABLET | Freq: Every day | ORAL | Status: DC

## 2014-09-08 MED ORDER — LAMOTRIGINE 100 MG PO TABS: 100.0000 mg | ORAL_TABLET | Freq: Two times a day (BID) | ORAL | Status: DC

## 2014-09-08 MED ORDER — FLUTICASONE PROPIONATE 50 MCG/ACT NA SUSP
2.0000 | Freq: Every day | NASAL | Status: DC
  Administered 2014-09-09 – 2014-09-14 (×6): 2 via NASAL
  Filled 2014-09-08: qty 16

## 2014-09-08 MED ORDER — ONDANSETRON HCL 4 MG PO TABS: 4.0000 mg | ORAL_TABLET | Freq: Four times a day (QID) | ORAL | Status: DC | PRN

## 2014-09-08 MED ORDER — RISPERIDONE 0.5 MG PO TABS
0.5000 mg | ORAL_TABLET | Freq: Every day | ORAL | Status: DC
  Administered 2014-09-09 – 2014-09-13 (×6): 0.5 mg via ORAL
  Filled 2014-09-08 (×7): qty 1

## 2014-09-08 MED ORDER — MIDODRINE HCL 5 MG PO TABS
10.0000 mg | ORAL_TABLET | Freq: Three times a day (TID) | ORAL | Status: DC
  Administered 2014-09-09 – 2014-09-14 (×15): 10 mg via ORAL
  Filled 2014-09-08 (×20): qty 2

## 2014-09-08 MED ORDER — ONDANSETRON HCL 4 MG/2ML IJ SOLN
4.0000 mg | Freq: Four times a day (QID) | INTRAMUSCULAR | Status: DC | PRN
  Administered 2014-09-09: 4 mg via INTRAVENOUS
  Filled 2014-09-08: qty 2

## 2014-09-08 MED ORDER — SODIUM CHLORIDE 0.9 % IV BOLUS (SEPSIS): 500.0000 mL | Freq: Once | INTRAVENOUS | Status: DC

## 2014-09-08 MED ORDER — VANCOMYCIN HCL IN DEXTROSE 1-5 GM/200ML-% IV SOLN
1000.0000 mg | INTRAVENOUS | Status: DC
  Filled 2014-09-08 (×2): qty 200

## 2014-09-08 MED ORDER — IPRATROPIUM-ALBUTEROL 0.5-2.5 (3) MG/3ML IN SOLN: 3.0000 mL | Freq: Four times a day (QID) | RESPIRATORY_TRACT | Status: DC | PRN

## 2014-09-08 MED ORDER — RENA-VITE PO TABS
1.0000 | ORAL_TABLET | Freq: Every day | ORAL | Status: DC
  Administered 2014-09-09 – 2014-09-13 (×6): 1 via ORAL
  Filled 2014-09-08 (×7): qty 1

## 2014-09-08 MED ORDER — IPRATROPIUM-ALBUTEROL 18-103 MCG/ACT IN AERO: 2.0000 | INHALATION_SPRAY | RESPIRATORY_TRACT | Status: DC | PRN

## 2014-09-08 MED ORDER — PIPERACILLIN-TAZOBACTAM IN DEX 2-0.25 GM/50ML IV SOLN
2.2500 g | Freq: Three times a day (TID) | INTRAVENOUS | Status: DC
  Administered 2014-09-08 – 2014-09-09 (×4): 2.25 g via INTRAVENOUS
  Filled 2014-09-08 (×6): qty 50

## 2014-09-08 MED ORDER — SODIUM CHLORIDE 0.9 % IV BOLUS (SEPSIS)
500.0000 mL | Freq: Once | INTRAVENOUS | Status: AC
  Administered 2014-09-08: 500 mL via INTRAVENOUS

## 2014-09-08 MED ORDER — VANCOMYCIN HCL 10 G IV SOLR
2500.0000 mg | Freq: Once | INTRAVENOUS | Status: AC
  Administered 2014-09-08: 2500 mg via INTRAVENOUS
  Filled 2014-09-08: qty 2500

## 2014-09-08 MED ORDER — POLYETHYLENE GLYCOL 3350 17 G PO PACK
17.0000 g | PACK | Freq: Two times a day (BID) | ORAL | Status: DC
  Administered 2014-09-09 – 2014-09-14 (×7): 17 g via ORAL
  Filled 2014-09-08 (×13): qty 1

## 2014-09-08 MED ORDER — VANCOMYCIN HCL IN DEXTROSE 1-5 GM/200ML-% IV SOLN: 1000.0000 mg | Freq: Once | INTRAVENOUS | Status: DC

## 2014-09-08 MED ORDER — BUDESONIDE-FORMOTEROL FUMARATE 160-4.5 MCG/ACT IN AERO
2.0000 | INHALATION_SPRAY | Freq: Two times a day (BID) | RESPIRATORY_TRACT | Status: DC
Start: 2014-09-08 — End: 2014-09-14
  Administered 2014-09-09 – 2014-09-13 (×10): 2 via RESPIRATORY_TRACT
  Filled 2014-09-08: qty 6

## 2014-09-08 MED ORDER — SODIUM CHLORIDE 0.9 % IV BOLUS (SEPSIS)
500.0000 mL | INTRAVENOUS | Status: DC | PRN
  Administered 2014-09-08: 500 mL via INTRAVENOUS

## 2014-09-08 MED ORDER — QUETIAPINE FUMARATE 25 MG PO TABS
25.0000 mg | ORAL_TABLET | Freq: Two times a day (BID) | ORAL | Status: DC
  Administered 2014-09-09 – 2014-09-11 (×6): 25 mg via ORAL
  Filled 2014-09-08 (×7): qty 1

## 2014-09-08 MED ORDER — PIPERACILLIN-TAZOBACTAM 3.375 G IVPB 30 MIN: 3.3750 g | Freq: Once | INTRAVENOUS | Status: DC

## 2014-09-08 MED ORDER — ALPRAZOLAM 0.5 MG PO TABS
1.0000 mg | ORAL_TABLET | Freq: Every day | ORAL | Status: DC
  Administered 2014-09-09: 1 mg via ORAL
  Filled 2014-09-08: qty 2

## 2014-09-08 MED ORDER — FLUDROCORTISONE ACETATE 0.1 MG PO TABS
0.1000 mg | ORAL_TABLET | Freq: Two times a day (BID) | ORAL | Status: DC
  Filled 2014-09-08 (×3): qty 1

## 2014-09-08 MED ORDER — TRAMADOL HCL 50 MG PO TABS: 50.0000 mg | ORAL_TABLET | Freq: Four times a day (QID) | ORAL | Status: DC | PRN

## 2014-09-08 MED ORDER — SENNOSIDES-DOCUSATE SODIUM 8.6-50 MG PO TABS
1.0000 | ORAL_TABLET | Freq: Two times a day (BID) | ORAL | Status: DC
  Administered 2014-09-09 – 2014-09-14 (×7): 1 via ORAL
  Filled 2014-09-08 (×13): qty 1

## 2014-09-08 MED ORDER — ACETAMINOPHEN 650 MG RE SUPP: 650.0000 mg | Freq: Four times a day (QID) | RECTAL | Status: DC | PRN

## 2014-09-08 MED ORDER — ACETAMINOPHEN 325 MG PO TABS: 650.0000 mg | ORAL_TABLET | Freq: Four times a day (QID) | ORAL | Status: DC | PRN

## 2014-09-08 MED ORDER — LEVETIRACETAM 500 MG PO TABS
1000.0000 mg | ORAL_TABLET | Freq: Two times a day (BID) | ORAL | Status: DC
  Administered 2014-09-09 – 2014-09-11 (×6): 1000 mg via ORAL
  Filled 2014-09-08 (×7): qty 2

## 2014-09-08 MED ORDER — CALCIUM ACETATE (PHOS BINDER) 667 MG PO CAPS
1334.0000 mg | ORAL_CAPSULE | Freq: Three times a day (TID) | ORAL | Status: DC
  Administered 2014-09-09 – 2014-09-14 (×16): 1334 mg via ORAL
  Filled 2014-09-08 (×20): qty 2

## 2014-09-08 MED ORDER — SEVELAMER CARBONATE 800 MG PO TABS
800.0000 mg | ORAL_TABLET | Freq: Three times a day (TID) | ORAL | Status: DC
  Administered 2014-09-09 – 2014-09-14 (×16): 800 mg via ORAL
  Filled 2014-09-08 (×21): qty 1

## 2014-09-08 NOTE — ED Notes (Signed)
EMS transported patient from [facility] for hypotension Systolic in the 22'E. Patient at [facility] office for scheduled appointment. States BP sometimes runs low.  Currently denies pain. States have been lightheaded and dizzy for 2 weeks. Alert answering and following commands appropriate.

## 2014-09-08 NOTE — ED Notes (Signed)
EKG completed and given to EDP.  

## 2014-09-08 NOTE — Progress Notes (Signed)
[id number]    Provider:  Dr Jaynee Eagles Referring Provider: Ricard Dillon, MD Primary Care Physician:  Alvester Morin, MD    CC: Seizures  Interval History 09/08/2014:[name] is a 59 y.o. female here as a referral from Dr. Arnoldo Morale for Seizures. Patient has a PMHx of Tuberous Sclerosis, MR,ESRD,anxiety disorder,schizoaffectivie disorder, depressive type, OSA on cpap, complete AV block,CHF, hypotension of HD, COPD, Gerd, pacemaker, refractory severe depression/psychosis. EEG in the office 07/30/2014 was normal in the waking and drowsy state. They come "every now and then when people upset me or do things to me like when they don't believe me". She goes to dialysis and has multiple seizures there and needs to get off the machine to get it straightened out. She stares at people when she has the seizure, she doesn't talk.Patient is on Keppra 1000mg  bid days not on dialysis, 1500mg  post HD 3x a week, lamictal 100mg  qhs, topamx 200mg  bid, xanax 0.5mg  bid. Seizures may be behavior and patient has been known to be attention seeking in the past. Her blood pressure is low today 60/38 with pulse of 60. She report diaphoresis and dizziness. EMS called immediately, transferred to [place]. Her nurse at her residence was informed as were her parents.   Initial visit 06/07/2014: [name] is a 59 y.o. female here as a referral from Dr. Arnoldo Morale for Seizures. Patient has a PMHx of Tuberous Sclerosis, MR,ESRD,anxiety disorder,schizoaffectivie disorder, depressive type, OSA on cpap, complete AV block,CHF, hypotension of HD, COPD, Gerd, pacemaker, refractory severe depression/psychosis. She is a poor historian and caretaker does not have any additional history, is just accompanying her to appointment. Patient reports she is here for seizures. They come "every now and then when people upset me or do things to me like when they don't believe me". She goes to dialysis and has  multiple seizures there and needs to get off the machine to get it straightened out. She stares at people when she has the seizure, she doesn't talk. She is on keppra for the seizures and is also on Topamax. She has had seizures since she was 59 years old. They last for 5-10 minutes. She shakes sometimes. She remembers everything that happens when she has the seizures, her eyes "give me a signal that I am having one". She can hear people talking to her but she doesn' t respond. It only happens "when people upset me about certain things". Sometimes she will have them up to 3x a week and afterwards she feels dizzy and lightheaded. Denies confusion after the seizures or feeling sleepy/tired. No urinary incontinence or tongue biting.   Reviewed notes, labs and imaging from outside physicians, which showed: Patient is on Keppra 1000mg  bid days not on dialysis, 1500mg  post HD 3x a week, lamictal 100mg  qhs, topamx 200mg  bid, xanax 0.5mg  bid, primidone 125mg  qhs,  She is also on eliquis 2.5mg bid, risperdal,sertraline 100mg , norco and tramadol for pain prn  Notes from her residence state that resident is an attention seeker, her thought process is not always logical, she makes statements that are untrue, resident "screams and yells when things do not go her way"   CT of the head 07/31/2011:  Findings: Beam hardening artifacts at skull base from shoulders. Normal ventricular morphology. No midline shift or mass effect. Subependymal calcifications at lateral ventricles consistent with tuberous sclerosis. No intracranial hemorrhage, mass lesion or evidence of acute infarction. No definite extra-axial fluid collections. Hyperostosis frontalis interna. Visualized paranasal sinuses and mastoid air  cells clear.  IMPRESSION: No acute intracranial abnormalities. 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  Review of Systems: Patient complains of symptoms per HPI as well as the  following symptoms: Insomnia, apnea, frequent waking, daytime sleepiness. Pertinent negatives per HPI. All others negative.   History   Social History  . Marital Status: Single    Spouse Name: N/A  . Number of Children: 0  . Years of Education: 10   Occupational History  . lives in group home    Social History Main Topics  . Smoking status: Never Smoker   . Smokeless tobacco: Never Used  . Alcohol Use: No  . Drug Use: No  . Sexual Activity: No   Other Topics Concern  . Not on file   Social History Narrative   Patient stays at [facility].     Family History  Problem Relation Age of Onset  . Diabetes Father   . Asthma Son     Past Medical History  Diagnosis Date  . Renal insufficiency   . Mental retardation   . Sleep apnea   . End-stage renal disease on hemodialysis   . Tuberous sclerosis   . Obesity   . GERD (gastroesophageal reflux disease)   . Depression   . Mental retardation   . Sleep apnea     on c-pap  . Hx MRSA infection   . Obesity   . Complication of anesthesia     "sometimes I don't wake up on time; depends on how much med"  . Asthma   . 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 Osteoarthritis   . Angina   . CHF (congestive heart failure)   . Shortness of breath     "all the time  . Pneumonia ? 2006  . Renal failure   . ESRD (end stage renal disease) on dialysis 05/25/11    "monday;wed;fri; last time was 05/25/11; usually @ [facility].""  . Blood transfusion   . Anemia   . Stomach ulcer   . Constipated   . Seizures     last seizure 05/21/11    Past Surgical History  Procedure Laterality Date  . Uterine tumor  05/25/11    pt denies this history  . Nephrectomy  01/08/1994; 01/15/2006    left; right  . Shunt in arm for dialysis      right   . Thrombectomy  05/2010    shunt in my right arm  . Cholecystectomy  07/02/1990  . Tonsillectomy  03/03/1978  . Avgg removal  06-26-11    Right arm AVG removed.    Current Outpatient Prescriptions    Medication Sig Dispense Refill  . ALPRAZolam (XANAX) 0.5 MG tablet     . albuterol-ipratropium (COMBIVENT) 18-103 MCG/ACT inhaler Inhale 2 puffs into the lungs every 4 (four) hours as needed. As needed for asthma     . ALPRAZolam (XANAX) 0.25 MG tablet Take by mouth.    . ALPRAZolam (XANAX) 1 MG tablet     . b complex-vitamin c-folic acid (NEPHRO-VITE) 0.8 MG TABS Take 0.8 mg by mouth at bedtime.      . celecoxib (CELEBREX) 200 MG capsule Take 200 mg by mouth 2 (two) times daily as needed. For pain    . clonazePAM (KLONOPIN) 0.5 MG tablet Take 0.5 mg by mouth. One on days of dialysis MWF    . cyclobenzaprine (FLEXERIL) 10 MG tablet Take 10 mg by mouth 2 (two) times daily.      Marland Kitchen  doxycycline (VIBRAMYCIN) 100 MG capsule     . DULoxetine (CYMBALTA) 30 MG capsule Take 30 mg by mouth 2 (two) times daily.     Marland Kitchen ELIQUIS 2.5 MG TABS tablet     . esomeprazole (NEXIUM) 40 MG capsule Take 40 mg by mouth daily before breakfast.      . fexofenadine (ALLEGRA) 180 MG tablet Take 180 mg by mouth daily as needed. For allergies    . fludrocortisone (FLORINEF) 0.1 MG tablet     . fluticasone (FLONASE) 50 MCG/ACT nasal spray Place 2 sprays into the nose daily. 16 g 11  . fluticasone (FLONASE) 50 MCG/ACT nasal spray Place 2 sprays into the nose daily. 16 g 11  . Fluticasone-Salmeterol (ADVAIR DISKUS) 250-50 MCG/DOSE AEPB Inhale 1 puff into the lungs every 12 (twelve) hours.      Marland Kitchen HYDROcodone-acetaminophen (NORCO/VICODIN) 5-325 MG per tablet Take by mouth.    . hydrocortisone-pramoxine (ANALPRAM-HC) 2.5-1 % rectal cream Place rectally 2 (two) times daily.      . iron polysaccharides (NIFEREX) 150 MG capsule Take 150 mg by mouth. Alternates with ferri-seque every day     . ketoconazole (NIZORAL) 2 % cream Apply topically 3 (three) times daily as needed. For rash    . lamoTRIgine (LAMICTAL) 100 MG tablet     . lamoTRIgine (LAMICTAL) 25 MG tablet     . levETIRAcetam (KEPPRA) 1000 MG tablet     . midodrine  (PROAMATINE) 10 MG tablet Take 10 mg by mouth 3 (three) times daily.      . mupirocin (BACTROBAN) 2 % ointment Apply topically 2 (two) times daily.      . ondansetron (ZOFRAN) 4 MG tablet Take 4 mg by mouth every 8 (eight) hours as needed. For nausea    . pantoprazole (PROTONIX) 40 MG tablet Take 1 tablet (40 mg total) by mouth daily. 30 tablet 6  . pantoprazole (PROTONIX) 40 MG tablet     . primidone (MYSOLINE) 250 MG tablet Take 500 mg by mouth 2 (two) times daily.     . QUEtiapine (SEROQUEL) 25 MG tablet Take by mouth.    . RESTASIS 0.05 % ophthalmic emulsion     . risperiDONE (RISPERDAL) 0.5 MG tablet     . sertraline (ZOLOFT) 100 MG tablet     . sevelamer (RENAGEL) 800 MG tablet Take 1,600 mg by mouth 3 (three) times daily with meals.     . SYMBICORT 160-4.5 MCG/ACT inhaler     . topiramate (TOPAMAX) 200 MG tablet Take 200 mg by mouth 2 (two) times daily.      Marland Kitchen triamcinolone (KENALOG) 0.025 % cream      No current facility-administered medications for this visit.    Allergies as of 09/08/2014 - Review Complete 09/08/2014  Allergen Reaction Noted  . Iodinated diagnostic agents Hives 05/21/2011  . Iodine  06/07/2014  . Methylprednisolone Hives   . Other Itching 06/10/2011  . Pollen extract Hives 06/07/2014  . Trimethadione  06/07/2014    Vitals: BP 60/38 mmHg  Pulse 60  Ht 5\' 7"  (1.702 m)  Wt 294 lb (133.358 kg)  BMI 46.04 kg/m2 Last Weight:  Wt Readings from Last 1 Encounters:  09/08/14 288 lb (130.636 kg)   Last Height:   Ht Readings from Last 1 Encounters:  09/08/14 5\' 6"  (1.676 m)   Neurologic exam not performed. Patient's Blood 60/38, patient symptomatic. EMS called immediately.  See previous note for thorough neurolgic exam.      Assessment/Plan:  Assessment/Plan:  Patient is here for f/u care for seizures. Patient has a PMHx of Tuberous Sclerosis, MR,ESRD,anxiety disorder,schizoaffectivie disorder, depressive type, OSA on cpap, complete AV block,CHF,  hypotension of HD, COPD, Gerd, pacemaker, refractory severe depression/psychosis.    Per last appt (today's appt deferred due to emergent transfer to [facility]) Patient reports her seizures come "every now and then when people upset me or do things to me like when they don't believe me". Epilepsy is common in Tuberous sclerosis, however her description of the events is suspicious for a behavioral aspect. Notes from her residence state that resident is an attention seeker, her thought process is not always logical, she makes statements that are untrue, resident "screams and yells when things do not go her way". EEG was normal. Will keep patient on current dose of anti-epileptic medications.  For management of Tuberous Sclerosis: Brain imaging should be obtained every several years for patients with TSC, to monitor for the development of subependymal giant cell tumors. CT of the head ordered 09/08/2014 by Dr. Betsey Holiday, will follow results. neuropsychiatric disorders are common and should be managed appropriately Skin lesions are common in TS but do not require treatment. Follow up with dermatology for skin lesions that are suspicious, that rapidly change in size or number or are painful. Recommend annual dermatologic exam.  Yearly dental follow up Close follow up for patient's renal,pulmonary and cardiac disease; common in [place] Annual dilated eye exams with ophthalmology.  Follow up in 6 months with Ward Givens, NP. Needs a 30 minute interview.  Sarina Ill, MD  [facility] [address] [place],  [address]  Phone [phone] Fax [phone]  A total of 15 minutes was spent face-to-face with this patient. Over half this time was spent on counseling patient on the seizure diagnosis and different diagnostic and therapeutic options available.

## 2014-09-08 NOTE — ED Notes (Signed)
NOTIFIED DR. POLINA FOR PATIENTS LAB RESULTS OF CG4+ LACTAC ACID @14 :27 PM.

## 2014-09-08 NOTE — Consult Note (Signed)
PULMONARY / CRITICAL CARE MEDICINE   Name: [name] MRN: [id number] DOB: 18-Sep-1955    ADMISSION DATE:  09/08/2014 CONSULTATION DATE:  09/08/2014  REFERRING MD : Dr Clementeen Graham  CHIEF COMPLAINT:  Hypotension  INITIAL PRESENTATION: 59 year old female, ESRD on HD, presented to [facility] ED 2/24 with dizziness one day after most recent dialysis treatment. In ED found to be hypotensive. Some improvement with gentle IVF resuscitation. PCCM to consult.   STUDIES:  CT head 2/24 > No acute abnormality noted. Stable subependymal calcifications  SIGNIFICANT EVENTS: 2/23 dialysis with seizure activity (history of same) 2/24 Admitted for hypotension  HISTORY OF PRESENT ILLNESS:  59 year old female with PMH as below, which includes ESRD on HD, MR, tuberous sclerosis, OSA, Asthma, epilepsy, complete heart block, psych, and CHF. She was at dialysis yesterday 2/23 at which time she complained of having several seizures, which is common for her. 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  She reports becoming hypotensive commonly after dialysis as well which usually resolves when they lay her flat for an extended period of time. She awoke 2/24 feeling dizzy to the point that she had to be pushed in the wheelchair to the dining hall of her residence facility, typically she can walk with cane. She had a neurology appointment today, where she was found to be hypotensive with SBP of 60. She was sent to [facility] ED where hypotension was confirmed. She was provided two 500 cc boluses with improvement in BP after each one. PCCM consulted for hypotension.   PAST MEDICAL HISTORY :   has a past medical history of Renal insufficiency; Mental retardation; Sleep apnea; End-stage renal disease on hemodialysis; Tuberous sclerosis; Obesity; GERD (gastroesophageal reflux disease); Depression; Mental retardation; Sleep apnea;  MRSA infection; Obesity; Complication of anesthesia; Asthma; Epilepsy (1961); Osteoarthritis; Angina; CHF (congestive heart failure); Shortness of breath; Pneumonia (? 2006); Renal failure; ESRD (end stage renal disease) on dialysis (05/25/11); Blood transfusion; Anemia; Stomach ulcer; Constipated; and Seizures.  has past surgical history that includes uterine tumor (05/25/11); Nephrectomy (01/08/1994; 01/15/2006); shunt in arm for dialysis; Thrombectomy (05/2010); Cholecystectomy (07/02/1990); Tonsillectomy (03/03/1978); and Arteriovenous goretex graft removal (06-26-11). Prior to Admission medications   Medication Sig Start Date End Date Taking? Authorizing Provider  albuterol-ipratropium (COMBIVENT) 18-103 MCG/ACT inhaler Inhale 2 puffs into the lungs every 4 (four) hours as needed. As needed for asthma    Yes Historical Provider, MD  ALPRAZolam (XANAX) 0.25 MG tablet Take 0.25 mg by mouth See admin instructions. Takes on dialysis days Tuesday, Thursday, and Saturday   Yes Historical Provider, MD  ALPRAZolam Duanne Moron) 0.5 MG tablet Take 0.5 mg by mouth at bedtime.  06/04/14  Yes Historical Provider, MD  ALPRAZolam Duanne Moron) 1 MG tablet Take 1 mg by mouth daily at 12 noon. Takes at noon 05/20/14  Yes Historical Provider, MD  b complex-vitamin c-folic acid (NEPHRO-VITE) 0.8 MG TABS Take 0.8 mg by mouth at bedtime.     Yes Historical Provider, MD  calcium acetate (PHOSLO) 667 MG capsule Take 1,334 mg by mouth 3 (three) times daily.   Yes Historical Provider, MD  celecoxib (CELEBREX) 200 MG capsule Take 200 mg by mouth 2 (two) times daily as needed. For pain   Yes Historical Provider, MD  clonazePAM (KLONOPIN) 0.5 MG tablet Take 0.5 mg by mouth. One on days of dialysis Tuesday, Thursday, and Saturday   Yes Historical Provider, MD  cyclobenzaprine (  FLEXERIL) 10 MG tablet Take 10 mg by mouth 2 (two) times daily.     Yes Historical Provider, MD  doxycycline (VIBRAMYCIN) 100 MG capsule Take 100 mg by mouth 2 (two)  times daily.  06/04/14  Yes Historical Provider, MD  DULoxetine (CYMBALTA) 30 MG capsule Take 30 mg by mouth 2 (two) times daily.    Yes Historical Provider, MD  ELIQUIS 2.5 MG TABS tablet Take 2.5 mg by mouth 2 (two) times daily.  06/04/14  Yes Historical Provider, MD  esomeprazole (NEXIUM) 40 MG capsule Take 40 mg by mouth daily before breakfast.     Yes Historical Provider, MD  fexofenadine (ALLEGRA) 180 MG tablet Take 180 mg by mouth 2 (two) times daily. For allergies   Yes Historical Provider, MD  fludrocortisone (FLORINEF) 0.1 MG tablet Take 0.1 mg by mouth 2 (two) times daily.  06/04/14  Yes Historical Provider, MD  fluticasone (FLONASE) 50 MCG/ACT nasal spray Place 2 sprays into the nose daily. Patient taking differently: Place 2 sprays into the nose 2 (two) times daily.  10/31/11  Yes Ricard Dillon, MD  Fluticasone-Salmeterol (ADVAIR DISKUS) 250-50 MCG/DOSE AEPB Inhale 1 puff into the lungs every 12 (twelve) hours.     Yes Historical Provider, MD  HYDROcodone-acetaminophen (NORCO/VICODIN) 5-325 MG per tablet Take 1 tablet by mouth every 4 (four) hours as needed for moderate pain or severe pain.    Yes Historical Provider, MD  hydrOXYzine (ATARAX/VISTARIL) 25 MG tablet Take 25 mg by mouth daily as needed.   Yes Historical Provider, MD  iron polysaccharides (NIFEREX) 150 MG capsule Take 150 mg by mouth. Alternates with ferri-seque every day    Yes Historical Provider, MD  ketoconazole (NIZORAL) 2 % cream Apply topically 3 (three) times daily as needed. For rash   Yes Historical Provider, MD  lamoTRIgine (LAMICTAL) 100 MG tablet Take 100 mg by mouth 2 (two) times daily.  06/04/14  Yes Historical Provider, MD  lamoTRIgine (LAMICTAL) 25 MG tablet Take 25 mg by mouth 2 (two) times daily.  05/07/14  Yes Historical Provider, MD  levETIRAcetam (KEPPRA) 1000 MG tablet Take 1,000 mg by mouth 2 (two) times daily.  06/04/14  Yes Historical Provider, MD  midodrine (PROAMATINE) 10 MG tablet Take 10 mg by  mouth 3 (three) times daily.     Yes Historical Provider, MD  mupirocin (BACTROBAN) 2 % ointment Apply topically 2 (two) times daily.     Yes Historical Provider, MD  ondansetron (ZOFRAN) 4 MG tablet Take 4 mg by mouth every 8 (eight) hours as needed. For nausea   Yes Historical Provider, MD  pantoprazole (PROTONIX) 40 MG tablet Take 40 mg by mouth daily.  06/04/14  Yes Historical Provider, MD  polyethylene glycol (MIRALAX / GLYCOLAX) packet Take 17 g by mouth 2 (two) times daily.   Yes Historical Provider, MD  QUEtiapine (SEROQUEL) 25 MG tablet Take 25 mg by mouth 2 (two) times daily.    Yes Historical Provider, MD  RESTASIS 0.05 % ophthalmic emulsion Place 1 drop into both eyes 2 (two) times daily.  06/01/14  Yes Historical Provider, MD  risperiDONE (RISPERDAL) 0.5 MG tablet Take 0.5 mg by mouth at bedtime.  06/04/14  Yes Historical Provider, MD  senna-docusate (SENOKOT-S) 8.6-50 MG per tablet Take 1 tablet by mouth 2 (two) times daily.   Yes Historical Provider, MD  sertraline (ZOLOFT) 100 MG tablet Take 100 mg by mouth at bedtime.  06/04/14  Yes Historical Provider, MD  sevelamer (RENAGEL) 800 MG  tablet Take 1,600 mg by mouth 3 (three) times daily with meals.    Yes Historical Provider, MD  SYMBICORT 160-4.5 MCG/ACT inhaler Inhale 2 puffs into the lungs 2 (two) times daily.  05/25/14  Yes Historical Provider, MD  topiramate (TOPAMAX) 200 MG tablet Take 200 mg by mouth 2 (two) times daily.     Yes Historical Provider, MD  traMADol (ULTRAM) 50 MG tablet Take 50 mg by mouth every 6 (six) hours as needed for moderate pain or severe pain.    Yes Historical Provider, MD  triamcinolone (KENALOG) 0.025 % cream Apply 1 application topically daily at 12 noon.  04/29/14  Yes Historical Provider, MD  fluticasone (FLONASE) 50 MCG/ACT nasal spray Place 2 sprays into the nose daily. 10/31/11   Ricard Dillon, MD  hydrocortisone-pramoxine [facility]) 2.5-1 % rectal cream Place rectally 2 (two) times daily.       Historical Provider, MD  pantoprazole (PROTONIX) 40 MG tablet Take 1 tablet (40 mg total) by mouth daily. 11/05/11 11/04/12  Ricard Dillon, MD   Allergies  Allergen Reactions  . Iodinated Diagnostic Agents Hives  . Iodine     Other reaction(s): Other (See Comments) Other Reaction: contrast-itching and flushing  . Methylprednisolone Hives  . Other Itching    Other reaction(s): Other (See Comments) Uncoded Allergy. Allergen: iv contrast dye, Other Reaction: flushing Arthritis medications  . Pollen Extract Hives  . Trimethadione     Other reaction(s): Other (See Comments) Other Reaction: Other reaction  . Trazodone And Nefazodone Hives and Rash    FAMILY HISTORY:  indicated that her mother is alive. She indicated that her father is alive.  SOCIAL HISTORY:  reports that she has never smoked. She has never used smokeless tobacco. She reports that she does not drink alcohol or use illicit drugs.  REVIEW OF SYSTEMS:  Bolds are positive  Constitutional: weight loss, gain, night sweats, Fevers, chills, fatigue .  HEENT: frontal headaches, Sore throat, sneezing, nasal congestion, post nasal drip, Difficulty swallowing, Tooth/dental problems, visual complaints visual changes, ear ache CV:  chest pain, radiates: ,Orthopnea, PND, swelling in lower extremities, dizziness, palpitations, syncope.  GI  heartburn, indigestion, abdominal pain, nausea, vomiting, diarrhea, change in bowel habits, loss of appetite, bloody stools.  Resp: cough, productive: , hemoptysis, dyspnea, chest pain, pleuritic.  Skin: rash or itching or icterus GU: dysuria, change in color of urine, urgency or frequency. flank pain, hematuria  MS: joint pain or swelling. decreased range of motion  Psych: change in mood or affect. depression or anxiety.  Neuro: difficulty with speech, weakness, numbness, ataxia, dizziness   SUBJECTIVE:   VITAL SIGNS: Temp:  [97.8 F (36.6 C)-98.8 F (37.1 C)] 98.8 F (37.1 C) (02/24  1350) Pulse Rate:  [57-88] 63 (02/24 1600) Resp:  [13-20] 16 (02/24 1600) BP: (58-127)/(20-108) 69/53 mmHg (02/24 1600) SpO2:  [94 %-100 %] 99 % (02/24 1600) Weight:  [130.636 kg (288 lb)-133.358 kg (294 lb)] 130.636 kg (288 lb) (02/24 1212) HEMODYNAMICS:   VENTILATOR SETTINGS:   INTAKE / OUTPUT: No intake or output data in the 24 hours ending 09/08/14 1707  PHYSICAL EXAMINATION: General:  Morbidly obese female in NAD Neuro:  Alert, oriented x 3, non-focal HEENT:  [place]/AT, PERRL, no JVD noted Cardiovascular:  RRR, no MRG Lungs:  Clear bilateral breath sounds Abdomen:  Soft, non-tender, non-distended. Obese Musculoskeletal:  No acute deformity Skin:  Old graft sites to BUE. Current graft to R groin. + thrill  LABS:  CBC  Recent Labs  Lab 09/08/14 1217  WBC 5.1  HGB 8.7*  HCT 28.2*  PLT 152   Coag's No results for input(s): APTT, INR in the last 168 hours. BMET  Recent Labs Lab 09/08/14 1217  NA 136  K 3.7  CL 96  CO2 28  BUN 34*  CREATININE 5.20*  GLUCOSE 97   Electrolytes  Recent Labs Lab 09/08/14 1217  CALCIUM 9.1   Sepsis Markers  Recent Labs Lab 09/08/14 1400  LATICACIDVEN 3.77*   ABG No results for input(s): PHART, PCO2ART, PO2ART in the last 168 hours. Liver Enzymes  Recent Labs Lab 09/08/14 1217  AST 19  ALT 10  ALKPHOS 277*  BILITOT 1.0  ALBUMIN 3.5   Cardiac Enzymes  Recent Labs Lab 09/08/14 1217  TROPONINI 0.04*   Glucose No results for input(s): GLUCAP in the last 168 hours.  Imaging No results found.   ASSESSMENT / PLAN:  Hypotension - suspect this is secondary to volume removal, she reports that this has happened before in the past and usually resolves. She has now gotten 1L of IVF resuscitation, BP cuff changed to appropriate size and location, and systolic pressure has improved to 90 mm/hg with MAP 61.   Rec: - Continue with gentle IVF resuscitation, would give up to 2 L crystalloid resuscitation.  - Close  monitoring of respiratory status - Hope she can avoid CVL and pressors - Would recheck lactic acid this evening, however, may not clear in setting ESRD  Alk Phos elevation - no complaints of abdominal sx/pain/tenderness. - Trend - Check mag/phos  Troponin elevation - appears chronic, paced rhythm, no CP complaints. - Trend troponin - Consider cardiology consultation for ECG findings and chronically elevated troponin   Agree with step-down admission, PCCM will follow  Georgann Housekeeper, AGACNP-BC [facility] Pager [phone] or [phone]   Baltazar Apo, MD, PhD 09/17/2014, 7:42 AM [facility] [phone] or if no answer [phone]

## 2014-09-08 NOTE — ED Notes (Signed)
Patient stated only able to urinate when making a bowel movement. Another hospital attempted an in and out cath and states they had a hard time and absolutely refuses in an out cath.

## 2014-09-08 NOTE — ED Notes (Signed)
Patient showed pictures of her family and talked about them. Asked when will she move rooms so she can watch TV. Patient alert answering and following commands appropriate.

## 2014-09-08 NOTE — Progress Notes (Signed)
ANTIBIOTIC CONSULT NOTE - INITIAL  Pharmacy Consult for Vancomycin and Zosyn Indication: rule out sepsis  Allergies  Allergen Reactions  . Iodinated Diagnostic Agents Hives  . Iodine     Other reaction(s): Other (See Comments) Other Reaction: contrast-itching and flushing  . Methylprednisolone Hives  . Other Itching    Other reaction(s): Other (See Comments) Uncoded Allergy. Allergen: iv contrast dye, Other Reaction: flushing Arthritis medications  . Pollen Extract Hives  . Trimethadione     Other reaction(s): Other (See Comments) Other Reaction: Other reaction  . Trazodone And Nefazodone Hives and Rash    Patient Measurements: Height: 5\' 6"  (167.6 cm) Weight: 288 lb (130.636 kg) IBW/kg (Calculated) : 59.3  Vital Signs: Temp: 98.8 F (37.1 C) (02/24 1350) Temp Source: Oral (02/24 1210) BP: 69/53 mmHg (02/24 1600) Pulse Rate: 63 (02/24 1600) Intake/Output from previous day:   Intake/Output from this shift:    Labs:  Recent Labs  09/08/14 1217  WBC 5.1  HGB 8.7*  PLT 152  CREATININE 5.20*   Estimated Creatinine Clearance: 16.7 mL/min (by C-G formula based on Cr of 5.2). No results for input(s): VANCOTROUGH, VANCOPEAK, VANCORANDOM, GENTTROUGH, GENTPEAK, GENTRANDOM, TOBRATROUGH, TOBRAPEAK, TOBRARND, AMIKACINPEAK, AMIKACINTROU, AMIKACIN in the last 72 hours.   Microbiology: No results found for this or any previous visit (from the past 720 hour(s)).  Medical History: Past Medical History  Diagnosis Date  . Renal insufficiency   . Mental retardation   . Sleep apnea   . End-stage renal disease on hemodialysis   . Tuberous sclerosis   . Obesity   . GERD (gastroesophageal reflux disease)   . Depression   . Mental retardation   . Sleep apnea     on c-pap  . Hx MRSA infection   . Obesity   . Complication of anesthesia     "sometimes I don't wake up on time; depends on how much med"  . Asthma   . 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 Osteoarthritis   . Angina   . CHF  (congestive heart failure)   . Shortness of breath     "all the time  . Pneumonia ? 2006  . Renal failure   . ESRD (end stage renal disease) on dialysis 05/25/11    "monday;wed;fri; last time was 05/25/11; usually @ [place].""  . Blood transfusion   . Anemia   . Stomach ulcer   . Constipated   . Seizures     last seizure 05/21/11    Medications:   (Not in a hospital admission) Scheduled:   Infusions:  . piperacillin-tazobactam (ZOSYN)  IV    . sodium chloride 500 mL (09/08/14 1703)  . vancomycin     Assessment: 59yo female with history of ESRD on HD (TTS) and seizures presents with hypotension. Pharmacy is consulted to dose vancomycin and zosyn for rule out sepsis. Pt did receive full HD yesterday, 2/23. Pt is afebrile, WBC 5.1, sCr 5.2, LA 3.77.  Goal of Therapy:  Vancomycin pre-HD target level 15-25 mcg/ml  Plan:  Zosyn 2.25g IV q8h Vancomycin 2500mg  IV load followed by 1000mg  qHD Measure antibiotic drug levels at steady state Follow up culture results, HD plans, and clinical course  Andrey Cota. Diona Foley, PharmD Clinical Pharmacist Pager [phone] 09/08/2014,5:39 PM

## 2014-09-08 NOTE — ED Provider Notes (Signed)
CSN: [id number]     Arrival date & time 09/08/14  1207 History   First MD Initiated Contact with Patient 09/08/14 1210     Chief Complaint  Patient presents with  . Hypotension     (Consider location/radiation/quality/duration/timing/severity/associated sxs/prior Treatment) HPI Comments: Patient sent to the ER from [facility] neurology. Patient was being seen for routine follow-up and was found to be hypotensive. Patient systolic blood pressure was in the 60s. Patient reports that she gets dizzy when she and walks. She has not been expressing any chest pain. She did endorse rectal bleeding, says she has hemorrhoids.  Patient does report that she had a syncopal episode last night. She stood up and got dizzy and fell back. She hit her head. She thinks she might have had a seizure, which she does have a history of.   Past Medical History  Diagnosis Date  . Renal insufficiency   . Mental retardation   . Sleep apnea   . End-stage renal disease on hemodialysis   . Tuberous sclerosis   . Obesity   . GERD (gastroesophageal reflux disease)   . Depression   . Mental retardation   . Sleep apnea     on c-pap  . Hx MRSA infection   . Obesity   . Complication of anesthesia     "sometimes I don't wake up on time; depends on how much med"  . Asthma   . 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 Osteoarthritis   . Angina   . CHF (congestive heart failure)   . Shortness of breath     "all the time  . Pneumonia ? 2006  . Renal failure   . ESRD (end stage renal disease) on dialysis 05/25/11    "monday;wed;fri; last time was 05/25/11; usually @ [place].""  . Blood transfusion   . Anemia   . Stomach ulcer   . Constipated   . Seizures     last seizure 05/21/11   Past Surgical History  Procedure Laterality Date  . Uterine tumor  05/25/11    pt denies this history  . Nephrectomy  01/08/1994; 01/15/2006    left; right  . Shunt in arm for dialysis      right   . Thrombectomy  05/2010    shunt in my right arm   . Cholecystectomy  07/02/1990  . Tonsillectomy  03/03/1978  . Avgg removal  06-26-11    Right arm AVG removed.   Family History  Problem Relation Age of Onset  . Diabetes Father   . Asthma Son    History  Substance Use Topics  . Smoking status: Never Smoker   . Smokeless tobacco: Never Used  . Alcohol Use: No   OB History    No data available     Review of Systems  Constitutional: Positive for fatigue.  Cardiovascular: Negative for chest pain.  Neurological: Positive for dizziness.  All other systems reviewed and are negative.     Allergies  Iodinated diagnostic agents; Iodine; Methylprednisolone; Other; Pollen extract; Trimethadione; and Trazodone and nefazodone  Home Medications   Prior to Admission medications   Medication Sig Start Date End Date Taking? Authorizing Provider  albuterol-ipratropium (COMBIVENT) 18-103 MCG/ACT inhaler Inhale 2 puffs into the lungs every 4 (four) hours as needed. As needed for asthma    Yes Historical Provider, MD  ALPRAZolam (XANAX) 0.25 MG tablet Take 0.25 mg by mouth See admin instructions. Takes on dialysis days Tuesday, Thursday, and Saturday   Yes Historical  Provider, MD  ALPRAZolam Duanne Moron) 0.5 MG tablet Take 0.5 mg by mouth at bedtime.  06/04/14  Yes Historical Provider, MD  ALPRAZolam Duanne Moron) 1 MG tablet Take 1 mg by mouth daily at 12 noon. Takes at noon 05/20/14  Yes Historical Provider, MD  b complex-vitamin c-folic acid (NEPHRO-VITE) 0.8 MG TABS Take 0.8 mg by mouth at bedtime.     Yes Historical Provider, MD  calcium acetate (PHOSLO) 667 MG capsule Take 1,334 mg by mouth 3 (three) times daily.   Yes Historical Provider, MD  celecoxib (CELEBREX) 200 MG capsule Take 200 mg by mouth 2 (two) times daily as needed. For pain   Yes Historical Provider, MD  clonazePAM (KLONOPIN) 0.5 MG tablet Take 0.5 mg by mouth. One on days of dialysis Tuesday, Thursday, and Saturday   Yes Historical Provider, MD  cyclobenzaprine (FLEXERIL) 10 MG  tablet Take 10 mg by mouth 2 (two) times daily.     Yes Historical Provider, MD  doxycycline (VIBRAMYCIN) 100 MG capsule Take 100 mg by mouth 2 (two) times daily.  06/04/14  Yes Historical Provider, MD  DULoxetine (CYMBALTA) 30 MG capsule Take 30 mg by mouth 2 (two) times daily.    Yes Historical Provider, MD  ELIQUIS 2.5 MG TABS tablet Take 2.5 mg by mouth 2 (two) times daily.  06/04/14  Yes Historical Provider, MD  esomeprazole (NEXIUM) 40 MG capsule Take 40 mg by mouth daily before breakfast.     Yes Historical Provider, MD  fexofenadine (ALLEGRA) 180 MG tablet Take 180 mg by mouth 2 (two) times daily. For allergies   Yes Historical Provider, MD  fludrocortisone (FLORINEF) 0.1 MG tablet Take 0.1 mg by mouth 2 (two) times daily.  06/04/14  Yes Historical Provider, MD  fluticasone (FLONASE) 50 MCG/ACT nasal spray Place 2 sprays into the nose daily. Patient taking differently: Place 2 sprays into the nose 2 (two) times daily.  10/31/11  Yes Ricard Dillon, MD  Fluticasone-Salmeterol (ADVAIR DISKUS) 250-50 MCG/DOSE AEPB Inhale 1 puff into the lungs every 12 (twelve) hours.     Yes Historical Provider, MD  HYDROcodone-acetaminophen (NORCO/VICODIN) 5-325 MG per tablet Take 1 tablet by mouth every 4 (four) hours as needed for moderate pain or severe pain.    Yes Historical Provider, MD  hydrOXYzine (ATARAX/VISTARIL) 25 MG tablet Take 25 mg by mouth daily as needed.   Yes Historical Provider, MD  iron polysaccharides (NIFEREX) 150 MG capsule Take 150 mg by mouth. Alternates with ferri-seque every day    Yes Historical Provider, MD  ketoconazole (NIZORAL) 2 % cream Apply topically 3 (three) times daily as needed. For rash   Yes Historical Provider, MD  lamoTRIgine (LAMICTAL) 100 MG tablet Take 100 mg by mouth 2 (two) times daily.  06/04/14  Yes Historical Provider, MD  lamoTRIgine (LAMICTAL) 25 MG tablet Take 25 mg by mouth 2 (two) times daily.  05/07/14  Yes Historical Provider, MD  levETIRAcetam (KEPPRA)  1000 MG tablet Take 1,000 mg by mouth 2 (two) times daily.  06/04/14  Yes Historical Provider, MD  midodrine (PROAMATINE) 10 MG tablet Take 10 mg by mouth 3 (three) times daily.     Yes Historical Provider, MD  mupirocin (BACTROBAN) 2 % ointment Apply topically 2 (two) times daily.     Yes Historical Provider, MD  ondansetron (ZOFRAN) 4 MG tablet Take 4 mg by mouth every 8 (eight) hours as needed. For nausea   Yes Historical Provider, MD  pantoprazole (PROTONIX) 40 MG tablet Take 40  mg by mouth daily.  06/04/14  Yes Historical Provider, MD  polyethylene glycol (MIRALAX / GLYCOLAX) packet Take 17 g by mouth 2 (two) times daily.   Yes Historical Provider, MD  QUEtiapine (SEROQUEL) 25 MG tablet Take 25 mg by mouth 2 (two) times daily.    Yes Historical Provider, MD  RESTASIS 0.05 % ophthalmic emulsion Place 1 drop into both eyes 2 (two) times daily.  06/01/14  Yes Historical Provider, MD  risperiDONE (RISPERDAL) 0.5 MG tablet Take 0.5 mg by mouth at bedtime.  06/04/14  Yes Historical Provider, MD  senna-docusate (SENOKOT-S) 8.6-50 MG per tablet Take 1 tablet by mouth 2 (two) times daily.   Yes Historical Provider, MD  sertraline (ZOLOFT) 100 MG tablet Take 100 mg by mouth at bedtime.  06/04/14  Yes Historical Provider, MD  sevelamer (RENAGEL) 800 MG tablet Take 1,600 mg by mouth 3 (three) times daily with meals.    Yes Historical Provider, MD  SYMBICORT 160-4.5 MCG/ACT inhaler Inhale 2 puffs into the lungs 2 (two) times daily.  05/25/14  Yes Historical Provider, MD  topiramate (TOPAMAX) 200 MG tablet Take 200 mg by mouth 2 (two) times daily.     Yes Historical Provider, MD  traMADol (ULTRAM) 50 MG tablet Take 50 mg by mouth every 6 (six) hours as needed for moderate pain or severe pain.    Yes Historical Provider, MD  triamcinolone (KENALOG) 0.025 % cream Apply 1 application topically daily at 12 noon.  04/29/14  Yes Historical Provider, MD  fluticasone (FLONASE) 50 MCG/ACT nasal spray Place 2 sprays  into the nose daily. 10/31/11   Ricard Dillon, MD  hydrocortisone-pramoxine [facility]) 2.5-1 % rectal cream Place rectally 2 (two) times daily.      Historical Provider, MD  pantoprazole (PROTONIX) 40 MG tablet Take 1 tablet (40 mg total) by mouth daily. 11/05/11 11/04/12  Ricard Dillon, MD   BP 69/53 mmHg  Pulse 63  Temp(Src) 98.8 F (37.1 C) (Oral)  Resp 16  Ht 5\' 6"  (1.676 m)  Wt 288 lb (130.636 kg)  BMI 46.51 kg/m2  SpO2 99% Physical Exam  Constitutional: She is oriented to person, place, and time. She appears well-developed and well-nourished. No distress.  HENT:  Head: Normocephalic and atraumatic.  Right Ear: Hearing normal.  Left Ear: Hearing normal.  Nose: Nose normal.  Mouth/Throat: Oropharynx is clear and moist and mucous membranes are normal.  Eyes: Conjunctivae and EOM are normal. Pupils are equal, round, and reactive to light.  Neck: Normal range of motion. Neck supple.  Cardiovascular: Regular rhythm, S1 normal and S2 normal.  Exam reveals no gallop and no friction rub.   No murmur heard. Pulmonary/Chest: Effort normal and breath sounds normal. No respiratory distress. She exhibits no tenderness.  Abdominal: Soft. Normal appearance and bowel sounds are normal. There is no hepatosplenomegaly. There is no tenderness. There is no rebound, no guarding, no tenderness at McBurney's point and negative Murphy's sign. No hernia.  Musculoskeletal: Normal range of motion.  Neurological: She is alert and oriented to person, place, and time. She has normal strength. No cranial nerve deficit or sensory deficit. Coordination normal. GCS eye subscore is 4. GCS verbal subscore is 5. GCS motor subscore is 6.  Skin: Skin is warm, dry and intact. No rash noted. No cyanosis.  Psychiatric: She has a normal mood and affect. Her speech is normal and behavior is normal. Thought content normal.  Nursing note and vitals reviewed.   ED Course  Procedures (  including critical care time) Labs  Review Labs Reviewed  CBC WITH DIFFERENTIAL/PLATELET - Abnormal; Notable for the following:    RBC 2.65 (*)    Hemoglobin 8.7 (*)    HCT 28.2 (*)    MCV 106.4 (*)    RDW 17.4 (*)    All other components within normal limits  COMPREHENSIVE METABOLIC PANEL - Abnormal; Notable for the following:    BUN 34 (*)    Creatinine, Ser 5.20 (*)    Alkaline Phosphatase 277 (*)    GFR calc non Af Amer 8 (*)    GFR calc Af Amer 10 (*)    All other components within normal limits  TROPONIN I - Abnormal; Notable for the following:    Troponin I 0.04 (*)    All other components within normal limits  I-STAT CG4 LACTIC ACID, ED - Abnormal; Notable for the following:    Lactic Acid, Venous 3.77 (*)    All other components within normal limits  POC OCCULT BLOOD, ED - Abnormal; Notable for the following:    Fecal Occult Bld POSITIVE (*)    All other components within normal limits  CULTURE, BLOOD (ROUTINE X 2)  CULTURE, BLOOD (ROUTINE X 2)  URINE CULTURE  URINALYSIS, ROUTINE W REFLEX MICROSCOPIC  URINALYSIS, ROUTINE W REFLEX MICROSCOPIC    Imaging Review Ct Head Wo Contrast  09/08/2014   CLINICAL DATA:  Lightheaded and dizzy for 2 weeks  EXAM: CT HEAD WITHOUT CONTRAST  TECHNIQUE: Contiguous axial images were obtained from the base of the skull through the vertex without intravenous contrast.  COMPARISON:  07/31/2011  FINDINGS: The bony calvarium is intact. No gross soft tissue abnormality is noted. Mild atrophic changes are noted. Subependymal calcifications are noted consistent with tuberous sclerosis. No acute hemorrhage, acute infarction or space-occupying mass lesion is identified.  IMPRESSION: No acute abnormality noted.  Stable subependymal calcifications.   Electronically Signed   By: Inez Catalina M.D.   On: 09/08/2014 13:38     EKG Interpretation None      MDM   Final diagnoses:  Syncope  Hypotension, unspecified hypotension type  Gastrointestinal hemorrhage, unspecified gastritis,  unspecified gastrointestinal hemorrhage type    Patient presents to the ER for evaluation of generalized weakness and hypotension. Patient reports dizziness with falls and pink she had a syncopal episode or seizure last night. She has an unremarkable neurologic exam. Patient is profoundly hypotensive. She appears to be tolerating it quite well, however. She does report seeing blood when she has a bowel movement. Rectal exam was heme positive. Patient is anemic below her baseline. She was administered IV fluids, will need transfusion. Patient to be admitted to the hospital for further management.  CRITICAL CARE Performed by: Orpah Greek   Total critical care time: 26min  Critical care time was exclusive of separately billable procedures and treating other patients.  Critical care was necessary to treat or prevent imminent or life-threatening deterioration.  Critical care was time spent personally by me on the following activities: development of treatment plan with patient and/or surrogate as well as nursing, discussions with consultants, evaluation of patient's response to treatment, examination of patient, obtaining history from patient or surrogate, ordering and performing treatments and interventions, ordering and review of laboratory studies, ordering and review of radiographic studies, pulse oximetry and re-evaluation of patient's condition.     Orpah Greek, MD 09/08/14 [phone]

## 2014-09-08 NOTE — ED Notes (Signed)
Admit Doctor at bedside.  

## 2014-09-08 NOTE — ED Notes (Signed)
Patient states history of hemorrhoids.

## 2014-09-08 NOTE — ED Notes (Signed)
Patient attempting to use hospital phone unable and is not working. Patient using nurse cell phone.

## 2014-09-08 NOTE — ED Notes (Signed)
Spoke with phlebotomy regarding blood draw.

## 2014-09-08 NOTE — H&P (Signed)
[facility] History and Physical                                                                                    [name], is a 59 y.o. female  MRN: [id number]   DOB - 1956-04-18  Admit Date - 09/08/2014  Outpatient Primary MD for the patient is Alvester Morin, MD  With History of -  Past Medical History  Diagnosis Date  . Renal insufficiency   . Mental retardation   . Sleep apnea   . End-stage renal disease on hemodialysis   . Tuberous sclerosis   . Obesity   . GERD (gastroesophageal reflux disease)   . Depression   . Mental retardation   . Sleep apnea     on c-pap  . Hx MRSA infection   . Obesity   . Complication of anesthesia     "sometimes I don't wake up on time; depends on how much med"  . Asthma   . 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 Osteoarthritis   . Angina   . CHF (congestive heart failure)   . Shortness of breath     "all the time  . Pneumonia ? 2006  . Renal failure   . ESRD (end stage renal disease) on dialysis 05/25/11    "monday;wed;fri; last time was 05/25/11; usually @ [facility]  . Blood transfusion   . Anemia   . Stomach ulcer   . Constipated   . Seizures     last seizure 05/21/11      Past Surgical History  Procedure Laterality Date  . Uterine tumor  05/25/11    pt denies this history  . Nephrectomy  01/08/1994; 01/15/2006    left; right  . Shunt in arm for dialysis      right   . Thrombectomy  05/2010    shunt in my right arm  . Cholecystectomy  07/02/1990  . Tonsillectomy  03/03/1978  . Avgg removal  06-26-11    Right arm AVG removed.    in for   Chief Complaint  Patient presents with  . Hypotension     HPI  [name]  is a 59 y.o. female, with mental retardation, end-stage renal disease (hemodialysis Tuesday/Thursday/Saturday), seizures, obstructive sleep apnea, peptic ulcer disease.  Who presents to the emergency department today after being sent from her neurologist's office for hypertension.  The patient  reports she has had frequent falls over the past year. Most recently she has been lightheaded and dizzy particularly in the morning.  She states that she frequently has petit mal seizures which go unnoticed by those around her. She complains of migraine headaches, and bright red blood in her stools secondary to hemorrhoids.  She states that she has started making urine again and is now urinating frequently.  In the ER, patient's blood pressure was 58/20. It improved to 127/108 with a 1 L bolus. The patient also had a lactic acid of 3.77.  Review of Systems   In addition to the HPI above,  No Fever-chills, No problems swallowing food or Liquids, No Chest pain, Cough or Shortness of Breath, No Abdominal pain, No Nausea or Vomiting,  Bowel movements are regular, No dysuria, she states that she has started urinating frequently recently. No new skin rashes or bruises, No new joints pains-aches,  No new weakness, tingling, numbness in any extremity, No recent weight gain or loss, A full 10 point Review of Systems was done, except as stated above, all other Review of Systems were negative.  Social History History  Substance Use Topics  . Smoking status: Never Smoker   . Smokeless tobacco: Never Used  . Alcohol Use: No    Family History Family History  Problem Relation Age of Onset  . Diabetes Father   . Asthma Son     Prior to Admission medications   Medication Sig Start Date End Date Taking? Authorizing Provider  albuterol-ipratropium (COMBIVENT) 18-103 MCG/ACT inhaler Inhale 2 puffs into the lungs every 4 (four) hours as needed. As needed for asthma    Yes Historical Provider, MD  ALPRAZolam (XANAX) 0.25 MG tablet Take 0.25 mg by mouth See admin instructions. Takes on dialysis days Tuesday, Thursday, and Saturday   Yes Historical Provider, MD  ALPRAZolam Duanne Moron) 0.5 MG tablet Take 0.5 mg by mouth at bedtime.  06/04/14  Yes Historical Provider, MD  ALPRAZolam Duanne Moron) 1 MG tablet Take 1  mg by mouth daily at 12 noon. Takes at noon 05/20/14  Yes Historical Provider, MD  b complex-vitamin c-folic acid (NEPHRO-VITE) 0.8 MG TABS Take 0.8 mg by mouth at bedtime.     Yes Historical Provider, MD  calcium acetate (PHOSLO) 667 MG capsule Take 1,334 mg by mouth 3 (three) times daily.   Yes Historical Provider, MD  celecoxib (CELEBREX) 200 MG capsule Take 200 mg by mouth 2 (two) times daily as needed. For pain   Yes Historical Provider, MD  clonazePAM (KLONOPIN) 0.5 MG tablet Take 0.5 mg by mouth. One on days of dialysis Tuesday, Thursday, and Saturday   Yes Historical Provider, MD  cyclobenzaprine (FLEXERIL) 10 MG tablet Take 10 mg by mouth 2 (two) times daily.     Yes Historical Provider, MD  doxycycline (VIBRAMYCIN) 100 MG capsule Take 100 mg by mouth 2 (two) times daily.  06/04/14  Yes Historical Provider, MD  DULoxetine (CYMBALTA) 30 MG capsule Take 30 mg by mouth 2 (two) times daily.    Yes Historical Provider, MD  ELIQUIS 2.5 MG TABS tablet Take 2.5 mg by mouth 2 (two) times daily.  06/04/14  Yes Historical Provider, MD  esomeprazole (NEXIUM) 40 MG capsule Take 40 mg by mouth daily before breakfast.     Yes Historical Provider, MD  fexofenadine (ALLEGRA) 180 MG tablet Take 180 mg by mouth 2 (two) times daily. For allergies   Yes Historical Provider, MD  fludrocortisone (FLORINEF) 0.1 MG tablet Take 0.1 mg by mouth 2 (two) times daily.  06/04/14  Yes Historical Provider, MD  fluticasone (FLONASE) 50 MCG/ACT nasal spray Place 2 sprays into the nose daily. Patient taking differently: Place 2 sprays into the nose 2 (two) times daily.  10/31/11  Yes Ricard Dillon, MD  Fluticasone-Salmeterol (ADVAIR DISKUS) 250-50 MCG/DOSE AEPB Inhale 1 puff into the lungs every 12 (twelve) hours.     Yes Historical Provider, MD  HYDROcodone-acetaminophen (NORCO/VICODIN) 5-325 MG per tablet Take 1 tablet by mouth every 4 (four) hours as needed for moderate pain or severe pain.    Yes Historical Provider, MD   hydrOXYzine (ATARAX/VISTARIL) 25 MG tablet Take 25 mg by mouth daily as needed.   Yes Historical Provider, MD  iron polysaccharides (NIFEREX) 150 MG  capsule Take 150 mg by mouth. Alternates with ferri-seque every day    Yes Historical Provider, MD  ketoconazole (NIZORAL) 2 % cream Apply topically 3 (three) times daily as needed. For rash   Yes Historical Provider, MD  lamoTRIgine (LAMICTAL) 100 MG tablet Take 100 mg by mouth 2 (two) times daily.  06/04/14  Yes Historical Provider, MD  lamoTRIgine (LAMICTAL) 25 MG tablet Take 25 mg by mouth 2 (two) times daily.  05/07/14  Yes Historical Provider, MD  levETIRAcetam (KEPPRA) 1000 MG tablet Take 1,000 mg by mouth 2 (two) times daily.  06/04/14  Yes Historical Provider, MD  midodrine (PROAMATINE) 10 MG tablet Take 10 mg by mouth 3 (three) times daily.     Yes Historical Provider, MD  mupirocin (BACTROBAN) 2 % ointment Apply topically 2 (two) times daily.     Yes Historical Provider, MD  ondansetron (ZOFRAN) 4 MG tablet Take 4 mg by mouth every 8 (eight) hours as needed. For nausea   Yes Historical Provider, MD  pantoprazole (PROTONIX) 40 MG tablet Take 40 mg by mouth daily.  06/04/14  Yes Historical Provider, MD  polyethylene glycol (MIRALAX / GLYCOLAX) packet Take 17 g by mouth 2 (two) times daily.   Yes Historical Provider, MD  QUEtiapine (SEROQUEL) 25 MG tablet Take 25 mg by mouth 2 (two) times daily.    Yes Historical Provider, MD  RESTASIS 0.05 % ophthalmic emulsion Place 1 drop into both eyes 2 (two) times daily.  06/01/14  Yes Historical Provider, MD  risperiDONE (RISPERDAL) 0.5 MG tablet Take 0.5 mg by mouth at bedtime.  06/04/14  Yes Historical Provider, MD  senna-docusate (SENOKOT-S) 8.6-50 MG per tablet Take 1 tablet by mouth 2 (two) times daily.   Yes Historical Provider, MD  sertraline (ZOLOFT) 100 MG tablet Take 100 mg by mouth at bedtime.  06/04/14  Yes Historical Provider, MD  sevelamer (RENAGEL) 800 MG tablet Take 1,600 mg by mouth 3  (three) times daily with meals.    Yes Historical Provider, MD  SYMBICORT 160-4.5 MCG/ACT inhaler Inhale 2 puffs into the lungs 2 (two) times daily.  05/25/14  Yes Historical Provider, MD  topiramate (TOPAMAX) 200 MG tablet Take 200 mg by mouth 2 (two) times daily.     Yes Historical Provider, MD  traMADol (ULTRAM) 50 MG tablet Take 50 mg by mouth every 6 (six) hours as needed for moderate pain or severe pain.    Yes Historical Provider, MD  triamcinolone (KENALOG) 0.025 % cream Apply 1 application topically daily at 12 noon.  04/29/14  Yes Historical Provider, MD  fluticasone (FLONASE) 50 MCG/ACT nasal spray Place 2 sprays into the nose daily. 10/31/11   Ricard Dillon, MD  hydrocortisone-pramoxine [facility]) 2.5-1 % rectal cream Place rectally 2 (two) times daily.      Historical Provider, MD  pantoprazole (PROTONIX) 40 MG tablet Take 1 tablet (40 mg total) by mouth daily. 11/05/11 11/04/12  Ricard Dillon, MD    Allergies  Allergen Reactions  . Iodinated Diagnostic Agents Hives  . Iodine     Other reaction(s): Other (See Comments) Other Reaction: contrast-itching and flushing  . Methylprednisolone Hives  . Other Itching    Other reaction(s): Other (See Comments) Uncoded Allergy. Allergen: iv contrast dye, Other Reaction: flushing Arthritis medications  . Pollen Extract Hives  . Trimethadione     Other reaction(s): Other (See Comments) Other Reaction: Other reaction  . Trazodone And Nefazodone Hives and Rash    Physical Exam  Vitals  Blood pressure 87/42, pulse 59, temperature 98.8 F (37.1 C), temperature source Oral, resp. rate 14, height 5\' 6"  (1.676 m), weight 130.636 kg (288 lb), SpO2 98 %.   General:  Obese, pleasant, mentally retarded female, lying in bed in NAD  Psych:  Normal affect and insight, Not Suicidal or Homicidal, Awake Alert, Oriented X 3.  Neuro:   No F.N deficits, ALL C.Nerves Intact, Strength 5/5 all 4 extremities, Sensation intact all 4  extremities.  ENT:  Ears and Eyes appear Normal, Conjunctivae clear, PER. Moist oral mucosa without erythema or exudates.  Neck:  Supple, No lymphadenopathy appreciated  Respiratory:  Symmetrical chest wall movement, Good air movement bilaterally, CTAB.  Cardiac:  RRR, No Murmurs, no LE edema noted, no JVD.    Abdomen:  Positive bowel sounds, Soft, Non tender, Non distended,  No masses appreciated  Skin:  No Cyanosis, Normal Skin Turgor, No Skin Rash or Bruise.  Extremities:  Able to move all 4. 5/5 strength in each,  no effusions.  Data Review  CBC  Recent Labs Lab 09/08/14 1217  WBC 5.1  HGB 8.7*  HCT 28.2*  PLT 152  MCV 106.4*  MCH 32.8  MCHC 30.9  RDW 17.4*  LYMPHSABS 1.1  MONOABS 0.3  EOSABS 0.1  BASOSABS 0.0    Chemistries   Recent Labs Lab 09/08/14 1217  NA 136  K 3.7  CL 96  CO2 28  GLUCOSE 97  BUN 34*  CREATININE 5.20*  CALCIUM 9.1  AST 19  ALT 10  ALKPHOS 277*  BILITOT 1.0    Cardiac Enzymes  Recent Labs Lab 09/08/14 1217  TROPONINI 0.04*     Imaging results:   Ct Head Wo Contrast  09/08/2014   CLINICAL DATA:  Lightheaded and dizzy for 2 weeks  EXAM: CT HEAD WITHOUT CONTRAST  TECHNIQUE: Contiguous axial images were obtained from the base of the skull through the vertex without intravenous contrast.  COMPARISON:  07/31/2011  FINDINGS: The bony calvarium is intact. No gross soft tissue abnormality is noted. Mild atrophic changes are noted. Subependymal calcifications are noted consistent with tuberous sclerosis. No acute hemorrhage, acute infarction or space-occupying mass lesion is identified.  IMPRESSION: No acute abnormality noted.  Stable subependymal calcifications.   Electronically Signed   By: Inez Catalina M.D.   On: 09/08/2014 13:38    My personal review of EKG: Junctional rhythm. Repeat EKG ordered   Assessment & Plan  Principal Problem:   Sepsis with hypotension Active Problems:   Bright red rectal bleeding   End stage  renal disease on dialysis   Seizures   Frequent falls   Elevated alkaline phosphatase level  Sepsis with hypotension. No obvious signs of infection. However, patient has a lactic acid of 3.77 and is significantly hypo-tensive. Will admit under sepsis order set with blood cultures, urine cultures and Vanco and Zosyn. The patient's blood pressure was so low, PCCM consulted and agreed to follow. Hypotension could be secondary to poly pharmacy. Please review medication list. Will decrease benzodiazepines. After reviewing her home medications I note that she is on both Florinef and midodrine. Perhaps she is not taking these medications appropriately Orthostatics ordered daily.    End-stage renal disease on dialysis Nephrology consulted. We'll likely see the patient tomorrow to arrange dialysis. Patient reports she is urinating more frequently on her own. Perhaps dry weight needs adjustment. Will defer to nephrology.  Bright red blood per rectal History of hemorrhoids. I believe the EDP has consulted GI. Will  place stool softeners and procto- foam.  Elevated alkaline phosphatase level Other LFTs are within normal limits. Requested alkaline phosphatase isoenzymes.  History of seizures No current seizure activity observed. Patient does not appear to be postictal. Will monitor Continue Keppra and Lamictal.  Frequent falls Likely due to hypotension and dehydration. Have requested physical therapy evaluation and orthostatics.  Social Patient lives at home with a friend. She is on a significant number of medications. It is unlikely that she is able to be compliant with these given her mental retardation. I question whether or not she may need to be in a more supervised environment. PT / OT/ Social Work consulted.   DVT Prophylaxis: Heparin  AM Labs Ordered, also please review Full Orders  Family Communication:   Patient alert and oriented.  Code Status:  Full code  Condition:   Guarded  Time spent in minutes : [address]  PA-C on 09/08/2014 at 6:27 PM  Between 7am to 7pm - Pager - [phone]  After 7pm go to [email or web address] - password [email or web address]  And look for the night coverage person covering me after hours  [facility]

## 2014-09-09 ENCOUNTER — Encounter (HOSPITAL_COMMUNITY): Payer: Self-pay | Admitting: *Deleted

## 2014-09-09 DIAGNOSIS — D62 Acute posthemorrhagic anemia: Secondary | ICD-10-CM

## 2014-09-09 DIAGNOSIS — I9589 Other hypotension: Secondary | ICD-10-CM | POA: Diagnosis present

## 2014-09-09 DIAGNOSIS — R569 Unspecified convulsions: Secondary | ICD-10-CM

## 2014-09-09 DIAGNOSIS — N186 End stage renal disease: Secondary | ICD-10-CM | POA: Diagnosis present

## 2014-09-09 DIAGNOSIS — K649 Unspecified hemorrhoids: Secondary | ICD-10-CM | POA: Diagnosis present

## 2014-09-09 DIAGNOSIS — I959 Hypotension, unspecified: Secondary | ICD-10-CM | POA: Diagnosis present

## 2014-09-09 DIAGNOSIS — Z992 Dependence on renal dialysis: Secondary | ICD-10-CM

## 2014-09-09 DIAGNOSIS — A419 Sepsis, unspecified organism: Secondary | ICD-10-CM

## 2014-09-09 DIAGNOSIS — R748 Abnormal levels of other serum enzymes: Secondary | ICD-10-CM

## 2014-09-09 DIAGNOSIS — K922 Gastrointestinal hemorrhage, unspecified: Secondary | ICD-10-CM | POA: Diagnosis present

## 2014-09-09 DIAGNOSIS — R55 Syncope and collapse: Secondary | ICD-10-CM | POA: Diagnosis present

## 2014-09-09 LAB — COMPREHENSIVE METABOLIC PANEL
ALT: 9 U/L (ref 0–35)
AST: 13 U/L (ref 0–37)
Albumin: 2.9 g/dL — ABNORMAL LOW (ref 3.5–5.2)
Alkaline Phosphatase: 230 U/L — ABNORMAL HIGH (ref 39–117)
Anion gap: 13 (ref 5–15)
BUN: 39 mg/dL — ABNORMAL HIGH (ref 6–23)
CHLORIDE: 97 mmol/L (ref 96–112)
CO2: 26 mmol/L (ref 19–32)
CREATININE: 5.91 mg/dL — AB (ref 0.50–1.10)
Calcium: 8.8 mg/dL (ref 8.4–10.5)
GFR calc Af Amer: 8 mL/min — ABNORMAL LOW (ref >90)
GFR, EST NON AFRICAN AMERICAN: 7 mL/min — AB (ref >90)
Glucose, Bld: 115 mg/dL — ABNORMAL HIGH (ref 70–99)
Potassium: 3.9 mmol/L (ref 3.5–5.1)
Sodium: 136 mmol/L (ref 135–145)
Total Bilirubin: 1 mg/dL (ref 0.3–1.2)
Total Protein: 5.8 g/dL — ABNORMAL LOW (ref 6.0–8.3)

## 2014-09-09 LAB — PREPARE RBC (CROSSMATCH)

## 2014-09-09 LAB — APTT: APTT: 34 s (ref 24–37)

## 2014-09-09 LAB — GLUCOSE, CAPILLARY
GLUCOSE-CAPILLARY: 110 mg/dL — AB (ref 70–99)
Glucose-Capillary: 174 mg/dL — ABNORMAL HIGH (ref 70–99)
Glucose-Capillary: 152 mg/dL — ABNORMAL HIGH (ref 70–99)
Glucose-Capillary: 132 mg/dL — ABNORMAL HIGH (ref 70–99)

## 2014-09-09 LAB — CBC
HCT: 25.3 % — ABNORMAL LOW (ref 36.0–46.0)
Hemoglobin: 7.6 g/dL — ABNORMAL LOW (ref 12.0–15.0)
MCH: 32.3 pg (ref 26.0–34.0)
MCHC: 30 g/dL (ref 30.0–36.0)
MCV: 107.7 fL — AB (ref 78.0–100.0)
Platelets: 143 10*3/uL — ABNORMAL LOW (ref 150–400)
RBC: 2.35 MIL/uL — ABNORMAL LOW (ref 3.87–5.11)
RDW: 17.6 % — ABNORMAL HIGH (ref 11.5–15.5)
WBC: 4.6 10*3/uL (ref 4.0–10.5)

## 2014-09-09 LAB — LACTIC ACID, PLASMA: LACTIC ACID, VENOUS: 2 mmol/L (ref 0.5–2.0)

## 2014-09-09 MED ORDER — CHLORHEXIDINE GLUCONATE CLOTH 2 % EX PADS
6.0000 | MEDICATED_PAD | Freq: Every day | CUTANEOUS | Status: AC
  Administered 2014-09-09 – 2014-09-13 (×5): 6 via TOPICAL

## 2014-09-09 MED ORDER — CALCITRIOL 0.25 MCG PO CAPS
0.2500 ug | ORAL_CAPSULE | ORAL | Status: DC
  Administered 2014-09-11 – 2014-09-14 (×2): 0.25 ug via ORAL
  Filled 2014-09-09 (×4): qty 1

## 2014-09-09 MED ORDER — LIDOCAINE HCL (PF) 1 % IJ SOLN
INTRAMUSCULAR | Status: AC
  Administered 2014-09-09: 5 mL
  Filled 2014-09-09: qty 5

## 2014-09-09 MED ORDER — ZOLPIDEM TARTRATE 5 MG PO TABS: 5.0000 mg | ORAL_TABLET | Freq: Every evening | ORAL | Status: DC | PRN

## 2014-09-09 MED ORDER — HYDROCORTISONE NA SUCCINATE PF 100 MG IJ SOLR
50.0000 mg | Freq: Four times a day (QID) | INTRAMUSCULAR | Status: DC
Start: 2014-09-09 — End: 2014-09-13
  Administered 2014-09-09 – 2014-09-13 (×16): 50 mg via INTRAVENOUS
  Filled 2014-09-09: qty 1
  Filled 2014-09-09: qty 2
  Filled 2014-09-09 (×4): qty 1
  Filled 2014-09-09 (×2): qty 2
  Filled 2014-09-09 (×7): qty 1
  Filled 2014-09-09: qty 2
  Filled 2014-09-09 (×4): qty 1

## 2014-09-09 MED ORDER — FLUDROCORTISONE ACETATE 0.1 MG PO TABS
0.2000 mg | ORAL_TABLET | Freq: Every day | ORAL | Status: DC
  Administered 2014-09-10 – 2014-09-12 (×3): 0.2 mg via ORAL
  Filled 2014-09-09 (×4): qty 2

## 2014-09-09 MED ORDER — FLUDROCORTISONE ACETATE 0.1 MG PO TABS
0.1000 mg | ORAL_TABLET | Freq: Every day | ORAL | Status: DC
  Administered 2014-09-09: 0.1 mg via ORAL

## 2014-09-09 MED ORDER — SODIUM CHLORIDE 0.9 % IV SOLN
125.0000 mg | INTRAVENOUS | Status: DC
  Administered 2014-09-14: 125 mg via INTRAVENOUS
  Filled 2014-09-09 (×2): qty 10

## 2014-09-09 MED ORDER — DARBEPOETIN ALFA 200 MCG/0.4ML IJ SOSY
200.0000 ug | PREFILLED_SYRINGE | INTRAMUSCULAR | Status: DC
  Administered 2014-09-14: 200 ug via INTRAVENOUS
  Filled 2014-09-09: qty 0.4

## 2014-09-09 MED ORDER — HYDROCORTISONE NA SUCCINATE PF 100 MG IJ SOLR
50.0000 mg | Freq: Once | INTRAMUSCULAR | Status: AC
  Administered 2014-09-09: 50 mg via INTRAVENOUS
  Filled 2014-09-09: qty 2

## 2014-09-09 MED ORDER — DEXTROSE 5 % IV SOLN
2.0000 ug/min | INTRAVENOUS | Status: DC
  Administered 2014-09-09: 5 ug/min via INTRAVENOUS
  Administered 2014-09-09: 20 ug/min via INTRAVENOUS
  Filled 2014-09-09 (×3): qty 4

## 2014-09-09 MED ORDER — SODIUM CHLORIDE 0.9 % IV SOLN: Freq: Once | INTRAVENOUS | Status: DC

## 2014-09-09 MED ORDER — CETYLPYRIDINIUM CHLORIDE 0.05 % MT LIQD
7.0000 mL | Freq: Two times a day (BID) | OROMUCOSAL | Status: DC
  Administered 2014-09-09 – 2014-09-14 (×8): 7 mL via OROMUCOSAL

## 2014-09-09 MED ORDER — VANCOMYCIN HCL IN DEXTROSE 1-5 GM/200ML-% IV SOLN: 1000.0000 mg | INTRAVENOUS | Status: DC

## 2014-09-09 MED ORDER — PANTOPRAZOLE SODIUM 40 MG PO TBEC
40.0000 mg | DELAYED_RELEASE_TABLET | Freq: Every day | ORAL | Status: DC
  Administered 2014-09-09 – 2014-09-14 (×6): 40 mg via ORAL
  Filled 2014-09-09 (×6): qty 1

## 2014-09-09 MED ORDER — DARBEPOETIN ALFA 200 MCG/0.4ML IJ SOSY: 200.0000 ug | PREFILLED_SYRINGE | INTRAMUSCULAR | Status: DC

## 2014-09-09 MED ORDER — SODIUM CHLORIDE 0.9 % IV SOLN
INTRAVENOUS | Status: DC
  Administered 2014-09-09 (×2): via INTRAVENOUS

## 2014-09-09 MED ORDER — DEXTROSE 5 % IV SOLN
2.0000 ug/min | INTRAVENOUS | Status: DC
  Administered 2014-09-09: 18 ug/min via INTRAVENOUS
  Filled 2014-09-09: qty 16

## 2014-09-09 MED ORDER — HYDROCORTISONE ACETATE 25 MG RE SUPP: 25.0000 mg | Freq: Two times a day (BID) | RECTAL | Status: DC

## 2014-09-09 MED ORDER — CHLORHEXIDINE GLUCONATE 0.12 % MT SOLN
15.0000 mL | Freq: Two times a day (BID) | OROMUCOSAL | Status: DC
  Administered 2014-09-09 – 2014-09-14 (×11): 15 mL via OROMUCOSAL
  Filled 2014-09-09 (×13): qty 15

## 2014-09-09 MED ORDER — MUPIROCIN 2 % EX OINT
1.0000 "application " | TOPICAL_OINTMENT | Freq: Two times a day (BID) | CUTANEOUS | Status: AC
  Administered 2014-09-09 – 2014-09-13 (×10): 1 via NASAL
  Filled 2014-09-09: qty 22

## 2014-09-09 MED ORDER — PANTOPRAZOLE SODIUM 40 MG IV SOLR: 40.0000 mg | INTRAVENOUS | Status: DC

## 2014-09-09 NOTE — Clinical Social Work Psychosocial (Signed)
Clinical Social Work Department BRIEF PSYCHOSOCIAL ASSESSMENT 09/09/2014  Patient:  [name], [name]     Account Number:  [id number]     Admit date:  09/08/2014  Clinical Social Worker:  Glendon Axe, CLINICAL SOCIAL WORKER  Date/Time:  09/09/2014 03:30 PM  Referred by:  Physician  Date Referred:  09/09/2014 Referred for  SNF Placement  ALF Placement   Other Referral:   Interview type:  Patient Other interview type:   CSW met with patient at bedside.    PSYCHOSOCIAL DATA Living Status:  FACILITY Admitted from facility:  [facility] Level of care:  [place] Primary support name:  [name] and [name] (Parents) Primary support relationship to patient:  PARENT Degree of support available:   [place]    CURRENT CONCERNS Current Concerns  Post-Acute Placement   Other Concerns:    SOCIAL WORK ASSESSMENT / PLAN Clinical Social Worker met with patient at bedside in reference to long-term placement. CSW introduced CSW role and requirements for SNF vs ALF. CSW also reviewed and provided SNF and ALF lists. CSW explained SNF vs ALF. Patient reported she would like to be located in the [place] area closer to family. Patient reported family is from the [place] area and are very supportive and involved in care. Patient further reported she has been a resident at [facility] [place] for about 3 years and requested long-term placement at [facility] and [facility]. CSW to submit pt's clinicals in [facility] as long-term placement and encouraged pt to consider other SNF's in area as well in the event [facility] does not have long-term availablity. CSW will continue to follow pt and pt's family for continued support and to follow pt for medical readiness.   Assessment/plan status:  Psychosocial Support/Ongoing Assessment of Needs Other assessment/ plan:   To update FL-2 and place on shadow chart for MD signature.    Pt needs PT/OT evaluations completed.    Information/referral to community resources:   SNF and ALF information provided.    PATIENT'S/FAMILY'S RESPONSE TO PLAN OF CARE: Pt alert, oriented and lying in bed. Pt reported she would like to be in the [place] area closer to family and desires placement at [facility] and [facility]. Pt is pleasant and expressed an apperciation for social work intervention. Pt gave CSW permission to contact family listed. Pt agreeable to SNF search.     Glendon Axe, MSW, LCSWA [phone] 09/09/2014 4:07 PM

## 2014-09-09 NOTE — Progress Notes (Addendum)
PULMONARY / CRITICAL CARE MEDICINE   Name: [name] MRN: [id number] DOB: 1956/02/21    ADMISSION DATE:  09/08/2014 CONSULTATION DATE:  09/08/2014  REFERRING MD : Dr Clementeen Graham  CHIEF COMPLAINT:  Hypotension  INITIAL PRESENTATION: 59 year old female, ESRD on HD, presented to [facility] ED 2/24 with dizziness one day after most recent dialysis treatment. In ED found to be hypotensive. Some improvement with gentle IVF resuscitation. PCCM to consult.   STUDIES:  CT head 2/24 > No acute abnormality noted. Stable subependymal calcifications  SIGNIFICANT EVENTS: 2/23 dialysis with seizure activity (history of same) 2/24 Admitted for hypotension  SUBJECTIVE: Remains hypotensive overnight, levophed is up to 20, history of hypotension.  VITAL SIGNS: Temp:  [97.5 F (36.4 C)-98.9 F (37.2 C)] 98.6 F (37 C) (02/25 1134) Pulse Rate:  [55-135] 59 (02/25 1000) Resp:  [0-36] 14 (02/25 1000) BP: (39-127)/(19-108) 90/48 mmHg (02/25 1000) SpO2:  [76 %-100 %] 98 % (02/25 1000) Arterial Line BP: (45-93)/(22-58) 84/37 mmHg (02/25 0700) FiO2 (%):  [31 %] 31 % (02/25 0500) Weight:  [130.636 kg (288 lb)-140.8 kg (310 lb 6.5 oz)] 140.8 kg (310 lb 6.5 oz) (02/25 0400) HEMODYNAMICS:   VENTILATOR SETTINGS: Vent Mode:  [-]  FiO2 (%):  [31 %] 31 % INTAKE / OUTPUT:  Intake/Output Summary (Last 24 hours) at 09/09/14 1135 Last data filed at 09/09/14 1000  Gross per 24 hour  Intake 470.64 ml  Output      0 ml  Net 470.64 ml   PHYSICAL EXAMINATION: General:  Morbidly obese female in NAD Neuro:  Alert, oriented x 3, non-focal HEENT:  [place]/AT, PERRL, no JVD noted Cardiovascular:  RRR, no MRG Lungs:  Clear bilateral breath sounds Abdomen:  Soft, non-tender, non-distended. Obese Musculoskeletal:  No acute deformity Skin:  Old graft sites to BUE. Current graft to R groin. + thrill  LABS:  CBC  Recent Labs Lab 09/08/14 1217 09/08/14 2310 09/09/14 0421  WBC 5.1 4.2 4.6  HGB 8.7* 8.8* 7.6*  HCT  28.2* 28.9* 25.3*  PLT 152 146* 143*   Coag's  Recent Labs Lab 09/08/14 1920 09/08/14 2153  APTT  --  34  INR 1.11  --    BMET  Recent Labs Lab 09/08/14 1217 09/08/14 1920 09/09/14 0337  NA 136 135 136  K 3.7 3.9 3.9  CL 96 97 97  CO2 _0 BUN 34* 36* 39*  CREATININE 5.20* 5.45* 5.91*  GLUCOSE 97 85 115*   Electrolytes  Recent Labs Lab 09/08/14 1217 09/08/14 1920 09/09/14 0337  CALCIUM 9.1 8.8 8.8   Sepsis Markers  Recent Labs Lab 09/08/14 1400 09/08/14 1920 09/08/14 2153  LATICACIDVEN 3.77* 1.4 2.0  PROCALCITON  --  0.70  --    ABG No results for input(s): PHART, PCO2ART, PO2ART in the last 168 hours. Liver Enzymes  Recent Labs Lab 09/08/14 1217 09/08/14 1920 09/09/14 0337  AST _1 ALT _2 ALKPHOS 277* 251* 230*  BILITOT 1.0 1.0 1.0  ALBUMIN 3.5 3.2* 2.9*   Cardiac Enzymes  Recent Labs Lab 09/08/14 1217  TROPONINI 0.04*   Glucose  Recent Labs Lab 09/09/14 0313  GLUCAP 110*    Imaging Ct Head Wo Contrast  09/08/2014   CLINICAL DATA:  Lightheaded and dizzy for 2 weeks  EXAM: CT HEAD WITHOUT CONTRAST  TECHNIQUE: Contiguous axial images were obtained from the base of the skull through the vertex without intravenous contrast.  COMPARISON:  07/31/2011  FINDINGS:  The bony calvarium is intact. No gross soft tissue abnormality is noted. Mild atrophic changes are noted. Subependymal calcifications are noted consistent with tuberous sclerosis. No acute hemorrhage, acute infarction or space-occupying mass lesion is identified.  IMPRESSION: No acute abnormality noted.  Stable subependymal calcifications.   Electronically Signed   By: Inez Catalina M.D.   On: 09/08/2014 13:38   Dg Chest Port 1 View  09/08/2014   CLINICAL DATA:  Sepsis, weakness, and shortness of breath.  EXAM: PORTABLE CHEST - 1 VIEW  COMPARISON:  05/25/2011  FINDINGS: Shallow inspiration. Cardiac enlargement without vascular congestion. No focal airspace disease in  the lungs. No blunting of costophrenic angles. No pneumothorax. Interval removal of previous central venous catheter.  IMPRESSION: Cardiac enlargement.  No evidence of active pulmonary disease.   Electronically Signed   By: Lucienne Capers M.D.   On: 09/08/2014 22:08     ASSESSMENT / PLAN:  Hypotension - suspect this is secondary to volume removal, she reports that this has happened before in the past and usually resolves. She has now gotten 1L of IVF resuscitation, BP cuff changed to appropriate size and location, and systolic pressure has improved to 90 mm/hg with MAP 61.   Rec: - KVO IVF. - Close monitoring of respiratory status. - TLC in and pressors started. - Check cortisol level. - Hydrocortisone 50 mg IV q6 hours. - Midodrine 10 mg PO TID. - Florinef 0.1 mg daily. - Will accept SBP of 70 as long as mental status remain intact.  Alk Phos elevation - no complaints of abdominal sx/pain/tenderness. - Trend. - Replace electrolytes as indicated.  Troponin elevation - appears chronic, paced rhythm, no CP complaints. - Trend troponin - Consider cardiology consultation for ECG findings and chronically elevated troponin - EKG unchanged.  The patient is critically ill with multiple organ systems failure and requires high complexity decision making for assessment and support, frequent evaluation and titration of therapies, application of advanced monitoring technologies and extensive interpretation of multiple databases.   Critical Care Time devoted to patient care services described in this note is  35  Minutes. This time reflects time of care of this signee Dr Jennet Maduro. This critical care time does not reflect procedure time, or teaching time or supervisory time of PA/NP/Med student/Med Resident etc but could involve care discussion time.  Rush Farmer, M.D. [facility] Pulmonary/Critical Care Medicine. Pager: [phone]. After hours pager: [phone].

## 2014-09-09 NOTE — Consult Note (Signed)
WOC wound consult note Reason for Consult: Consult requested for abd wound.  Appears to be a chronic full thickness wound which has not closed from a previous surgery. Measurement: 5X1.5X.2cm Wound bed: 20% yellow slough, 80% beefy red Drainage (amount, consistency, odor) Mod amt thick tan drainage, no odor Periwound: Intact skin surrounding Dressing procedure/placement/frequency: Aquacel to absorb drainage and provide antimicrobial benefits.   Please re-consult if further assistance is needed.  Thank-you,  Julien Girt MSN, [place], [place], [place], [place]

## 2014-09-09 NOTE — Progress Notes (Signed)
Apparently the nurse gave this med

## 2014-09-09 NOTE — Progress Notes (Signed)
OT Cancellation Note  Patient Details Name: [name] MRN: [id number] DOB: [birth year]   Cancelled Treatment:    Reason Eval/Treat Not Completed: Medical issues which prohibited therapy. Pt transferred to ICU this am with hypotension and on Levophed 20 too high to see at this time. Will continue to follow.  Malka So 09/09/2014, 7:55 AM

## 2014-09-09 NOTE — Progress Notes (Signed)
PT Cancellation Note  Patient Details Name: [name] MRN: [id number] DOB: [birth year]   Cancelled Treatment:    Reason Eval/Treat Not Completed: Medical issues which prohibited therapy (pt transferred to ICU this am with hypotension and on Levophed 20 too high to see at this time. will check next date)   Melford Aase 09/09/2014, 7:52 AM Elwyn Reach, [place]

## 2014-09-09 NOTE — Procedures (Signed)
Central Venous Catheter Insertion Procedure Note [name] [id number] [birth year]  Procedure: Insertion of Central Venous Catheter Indications: Assessment of intravascular volume, Drug and/or fluid administration and Frequent blood sampling  Procedure Details Consent: Risks of procedure as well as the alternatives and risks of each were explained to the (patient/caregiver).  Consent for procedure obtained. Time Out: Verified patient identification, verified procedure, site/side was marked, verified correct patient position, special equipment/implants available, medications/allergies/relevent history reviewed, required imaging and test results available.  Performed  Maximum sterile technique was used including antiseptics, cap, gloves, gown, hand hygiene, mask and sheet. Skin prep: Chlorhexidine; local anesthetic administered A antimicrobial bonded/coated triple lumen catheter was placed in the left femoral vein due to patient being a dialysis patient using the Seldinger technique.  Evaluation Blood flow good Complications: No apparent complications Patient did tolerate procedure well. Chest X-ray ordered to verify placement.  CXR: not needed.  Procedure performed under direct ultrasound guidance for real time vessel cannulation.      Montey Hora, [facility] Pager: [phone]  or [phone] 09/09/2014, 5:29 AM

## 2014-09-09 NOTE — Progress Notes (Signed)
eLink Physician-Brief Progress Note Patient Name: [name] DOB: 03/29/1956 MRN: [id number]   Date of Service  09/09/2014  HPI/Events of Note  Called by RN re: persistently low NIBP readings. Accuracy of readings is suspect due to girth of her arm. Pt is minimally symptomatic - a little light-headed upon standing. Unable to perform camera evaluation (not in camera room)  eICU Interventions  Transfer to ICU for arterial catheter insertion (as RN reports unable ot have art line on that SDU)     Intervention Category Major Interventions: Hypotension - evaluation and management  Merton Border 09/09/2014, 2:06 AM

## 2014-09-09 NOTE — Consult Note (Signed)
Renal Service Consult Note [facility]  [name] 09/09/2014 Sol Blazing Requesting Physician:  Dr Sherral Hammers  Reason for Consult:  ESRD pt with hypotension HPI: The patient is a 59 y.o. 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  She has hx of low BP's on midodrine. She had HD on 2/23 w usual low BP drops and n/v.  Reportedly 6kg were removed which is typical for her to gain large amounts of fluid due to dietary nonadherence (4-7kg is usual). That night after HD at home she stood up , got dizzy and fell back and hit her head the night before at home and may have had a light seizure.   She woke up yesterday am lightheaded and went to neurologist's office for routine f/u and was found to be hypotensive w a BP of 60.  She was sent to [facility] ED and low BP was confirmed.  She rec'd NS bolus 500 cc x 2 with some improvement but still required ICU admission and is now on pressors w levophed gtt.   She has no complaints now. BP 80/50, no CP, SOB, no n/v/d, no abd pain.  Has open wound upper abd , the wound is old from a prior nephrectomy surgery. No rash, HA, confusion. No diarrhea. No cough.    She reports petit mal seizures as well from time to time but no change recently.  Chart review: 4/07 - large angiomyolipoma L kidney, anemia, hx R nephrectomy, tuberous sclerosis, sz d/o, asthma, CKD 4 10/07 - seizure d/o, HD cath sepsis,  MRSA bacteremia, ESRD on HD, L IJ cath out and new femoral HD cath placed but pt became so upset about the femoral cath that is was removed and another IJ cath placed 6/08 - ligation LUA AVF, new left lower arm AVG and R IJ cath 1/09 - clotted access, hyperkalemia > both IJ veins occluded, failed attempt to place IJ cath, left fem cath for HD 3/09 - CP, ruled out, low prob Myoview 5/09 - missed 1 week HD, hyperkalemia w bradycardia, vol excess ; managed in IP HD 6/09  - vol excess, missed HD due to swollen legs, > admit rx with IP HD 8/09 - missed HD x 1 week, high K, EKG changes > rx w meds and HD 9/09 - confusion, missed HD x 1 wk, psych consult said pt lacked capacity for decision-making; pt's godmother agreed to be legal guardian and get pt to HD. Switched to HD at [facility] Paranoid schizophrenia rx w meds 11/09 - infected LUA AVG w E Faecalis bacteremia rx with AVG removal ? (not in dc summary) 2/10 - abd pain, resolved, no specific dx 3/10 - fever/chills , SIRS, low PB > watched in ICU, got better than left AMA  3/11 - hyperkalemia, ESRD, tub sclerosis on sz meds, R graft declot 4/11 - hypotension , better with IVF's, chronic diarrhea , ESRD on HD.  5/11 - syncope at HD unit, high K, ESRD, mental retardation and schizophrenia 6/11 - ABLA d/t hemorrhoids, HTN, constiopation, ESRD 7/11 - hyperK, bradycardia, hypotension. Lives at ALF. N/V too. K 7.1, BP 80's rec'd IVF in ED and rx for highK. Had urgent HD. Rectal bleeding Hb 9 > 6.7, rx with prbcs. Colonscopy showed int hemorrhoids 9/11 - questionable seizure at ALF Sunday night > to ER, high K, urgent HD. Low BP's on midodrine, ESRD. 12/11 - acute on chronic hypotension,  ESRD on MWF HD, hx bilat nephrectomy, MO, OSA, schizo/MR 12/11 - hemorrhagic shock from bleeding RUE HeRO access > went to OR and underwent revision of HeRO access.  2/12 - high K, bradycardia due to highK, clotted HD access > temp femoral HD cath placed for HD, then HeRO declotted and pt had further HD with improved K. DC'd back to ALF. 11/12 - mechanical fall, no seizures, ESRD. Head CT negative.      Past Medical History  Past Medical History  Diagnosis Date  . Renal insufficiency   . Mental retardation   . Sleep apnea   . End-stage renal disease on hemodialysis   . Tuberous sclerosis   . Obesity   . GERD (gastroesophageal reflux disease)   . Depression   . Mental retardation   . Sleep apnea     on c-pap  . Hx MRSA  infection   . Obesity   . Complication of anesthesia     "sometimes I don't wake up on time; depends on how much med"  . Asthma   . 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 Osteoarthritis   . Angina   . CHF (congestive heart failure)   . Shortness of breath     "all the time"  . Pneumonia ? 2006  . Renal failure   . ESRD (end stage renal disease) on dialysis 05/25/11    Tu, Th, Sa  . Blood transfusion   . Anemia   . Stomach ulcer   . Constipated   . Seizures     last seizure 05/21/11  . Pacemaker     AV paced   Past Surgical History  Past Surgical History  Procedure Laterality Date  . Uterine tumor  05/25/11    pt denies this history  . Nephrectomy  01/08/1994; 01/15/2006    left; right  . Shunt in arm for dialysis      right   . Thrombectomy  05/2010    shunt in my right arm  . Cholecystectomy  07/02/1990  . Tonsillectomy  03/03/1978  . Avgg removal  06-26-11    Right arm AVG removed.   Family History  Family History  Problem Relation Age of Onset  . Diabetes Father   . Asthma Son    Social History  reports that she has never smoked. She has never used smokeless tobacco. She reports that she does not drink alcohol or use illicit drugs. Allergies  Allergies  Allergen Reactions  . Iodinated Diagnostic Agents Hives  . Iodine     Other reaction(s): Other (See Comments) Other Reaction: contrast-itching and flushing  . Methylprednisolone Hives  . Other Itching    Other reaction(s): Other (See Comments) Uncoded Allergy. Allergen: iv contrast dye, Other Reaction: flushing Arthritis medications  . Pollen Extract Hives  . Trimethadione     Other reaction(s): Other (See Comments) Other Reaction: Other reaction  . Trazodone And Nefazodone Hives and Rash   Home medications Prior to Admission medications   Medication Sig Start Date End Date Taking? Authorizing Provider  albuterol-ipratropium (COMBIVENT) 18-103 MCG/ACT inhaler Inhale 2 puffs into the lungs every 4 (four) hours as needed. As  needed for asthma    Yes Historical Provider, MD  ALPRAZolam (XANAX) 0.25 MG tablet Take 0.25 mg by mouth See admin instructions. Takes on dialysis days Tuesday, Thursday, and Saturday   Yes Historical Provider, MD  ALPRAZolam Duanne Moron) 0.5 MG tablet Take 0.5 mg by mouth at bedtime.  06/04/14  Yes Historical Provider, MD  ALPRAZolam (  XANAX) 1 MG tablet Take 1 mg by mouth daily at 12 noon. Takes at noon 05/20/14  Yes Historical Provider, MD  b complex-vitamin c-folic acid (NEPHRO-VITE) 0.8 MG TABS Take 0.8 mg by mouth at bedtime.     Yes Historical Provider, MD  calcium acetate (PHOSLO) 667 MG capsule Take 1,334 mg by mouth 3 (three) times daily.   Yes Historical Provider, MD  celecoxib (CELEBREX) 200 MG capsule Take 200 mg by mouth 2 (two) times daily as needed. For pain   Yes Historical Provider, MD  clonazePAM (KLONOPIN) 0.5 MG tablet Take 0.5 mg by mouth. One on days of dialysis Tuesday, Thursday, and Saturday   Yes Historical Provider, MD  cyclobenzaprine (FLEXERIL) 10 MG tablet Take 10 mg by mouth 2 (two) times daily.     Yes Historical Provider, MD  doxycycline (VIBRAMYCIN) 100 MG capsule Take 100 mg by mouth 2 (two) times daily.  06/04/14  Yes Historical Provider, MD  DULoxetine (CYMBALTA) 30 MG capsule Take 30 mg by mouth 2 (two) times daily.    Yes Historical Provider, MD  ELIQUIS 2.5 MG TABS tablet Take 2.5 mg by mouth 2 (two) times daily.  06/04/14  Yes Historical Provider, MD  esomeprazole (NEXIUM) 40 MG capsule Take 40 mg by mouth daily before breakfast.     Yes Historical Provider, MD  fexofenadine (ALLEGRA) 180 MG tablet Take 180 mg by mouth 2 (two) times daily. For allergies   Yes Historical Provider, MD  fludrocortisone (FLORINEF) 0.1 MG tablet Take 0.1 mg by mouth 2 (two) times daily.  06/04/14  Yes Historical Provider, MD  fluticasone (FLONASE) 50 MCG/ACT nasal spray Place 2 sprays into the nose daily. Patient taking differently: Place 2 sprays into the nose 2 (two) times daily.   10/31/11  Yes Ricard Dillon, MD  Fluticasone-Salmeterol (ADVAIR DISKUS) 250-50 MCG/DOSE AEPB Inhale 1 puff into the lungs every 12 (twelve) hours.     Yes Historical Provider, MD  HYDROcodone-acetaminophen (NORCO/VICODIN) 5-325 MG per tablet Take 1 tablet by mouth every 4 (four) hours as needed for moderate pain or severe pain.    Yes Historical Provider, MD  hydrOXYzine (ATARAX/VISTARIL) 25 MG tablet Take 25 mg by mouth daily as needed.   Yes Historical Provider, MD  iron polysaccharides (NIFEREX) 150 MG capsule Take 150 mg by mouth. Alternates with ferri-seque every day    Yes Historical Provider, MD  ketoconazole (NIZORAL) 2 % cream Apply topically 3 (three) times daily as needed. For rash   Yes Historical Provider, MD  lamoTRIgine (LAMICTAL) 100 MG tablet Take 100 mg by mouth 2 (two) times daily.  06/04/14  Yes Historical Provider, MD  lamoTRIgine (LAMICTAL) 25 MG tablet Take 25 mg by mouth 2 (two) times daily.  05/07/14  Yes Historical Provider, MD  levETIRAcetam (KEPPRA) 1000 MG tablet Take 1,000 mg by mouth 2 (two) times daily.  06/04/14  Yes Historical Provider, MD  midodrine (PROAMATINE) 10 MG tablet Take 10 mg by mouth 3 (three) times daily.     Yes Historical Provider, MD  mupirocin (BACTROBAN) 2 % ointment Apply topically 2 (two) times daily.     Yes Historical Provider, MD  ondansetron (ZOFRAN) 4 MG tablet Take 4 mg by mouth every 8 (eight) hours as needed. For nausea   Yes Historical Provider, MD  pantoprazole (PROTONIX) 40 MG tablet Take 40 mg by mouth daily.  06/04/14  Yes Historical Provider, MD  polyethylene glycol (MIRALAX / GLYCOLAX) packet Take 17 g by mouth 2 (two)  times daily.   Yes Historical Provider, MD  QUEtiapine (SEROQUEL) 25 MG tablet Take 25 mg by mouth 2 (two) times daily.    Yes Historical Provider, MD  RESTASIS 0.05 % ophthalmic emulsion Place 1 drop into both eyes 2 (two) times daily.  06/01/14  Yes Historical Provider, MD  risperiDONE (RISPERDAL) 0.5 MG tablet Take  0.5 mg by mouth at bedtime.  06/04/14  Yes Historical Provider, MD  senna-docusate (SENOKOT-S) 8.6-50 MG per tablet Take 1 tablet by mouth 2 (two) times daily.   Yes Historical Provider, MD  sertraline (ZOLOFT) 100 MG tablet Take 100 mg by mouth at bedtime.  06/04/14  Yes Historical Provider, MD  sevelamer (RENAGEL) 800 MG tablet Take 1,600 mg by mouth 3 (three) times daily with meals.    Yes Historical Provider, MD  SYMBICORT 160-4.5 MCG/ACT inhaler Inhale 2 puffs into the lungs 2 (two) times daily.  05/25/14  Yes Historical Provider, MD  topiramate (TOPAMAX) 200 MG tablet Take 200 mg by mouth 2 (two) times daily.     Yes Historical Provider, MD  traMADol (ULTRAM) 50 MG tablet Take 50 mg by mouth every 6 (six) hours as needed for moderate pain or severe pain.    Yes Historical Provider, MD  triamcinolone (KENALOG) 0.025 % cream Apply 1 application topically daily at 12 noon.  04/29/14  Yes Historical Provider, MD  fluticasone (FLONASE) 50 MCG/ACT nasal spray Place 2 sprays into the nose daily. 10/31/11   Ricard Dillon, MD  hydrocortisone-pramoxine [facility]) 2.5-1 % rectal cream Place rectally 2 (two) times daily.      Historical Provider, MD  pantoprazole (PROTONIX) 40 MG tablet Take 1 tablet (40 mg total) by mouth daily. 11/05/11 11/04/12  Ricard Dillon, MD   Liver Function Tests  Recent Labs Lab 09/08/14 1217 09/08/14 1920 09/09/14 0337  AST 19 13 13   ALT 10 8 9   ALKPHOS 277* 251* 230*  BILITOT 1.0 1.0 1.0  PROT 6.9 6.4 5.8*  ALBUMIN 3.5 3.2* 2.9*   No results for input(s): LIPASE, AMYLASE in the last 168 hours. CBC  Recent Labs Lab 09/08/14 1217 09/08/14 2310 09/09/14 0421  WBC 5.1 4.2 4.6  NEUTROABS 3.6 2.7  --   HGB 8.7* 8.8* 7.6*  HCT 28.2* 28.9* 25.3*  MCV 106.4* 108.2* 107.7*  PLT 152 146* 010*   Basic Metabolic Panel  Recent Labs Lab 09/08/14 1217 09/08/14 1920 09/09/14 0337  NA 136 135 136  K 3.7 3.9 3.9  CL 96 97 97  CO2 28 25 26   GLUCOSE 97 85 115*   BUN 34* 36* 39*  CREATININE 5.20* 5.45* 5.91*  CALCIUM 9.1 8.8 8.8    Filed Vitals:   09/09/14 0741 09/09/14 0800 09/09/14 0900 09/09/14 1000  BP:  86/44 92/48 90/48   Pulse:  59 59 59  Temp: 98.9 F (37.2 C)     TempSrc: Axillary     Resp:  14 14 14   Height:      Weight:      SpO2:  94% 98% 98%   Exam Alert, no distress , BP 80/50 No rash, cyanosis or gangrene Sclera anicteric, throat clear No jvd Chest clear bilat RRR no RMG Abd obese, large upper mid abdomen hernia w opened wound 1x 4 cm, no drainage 1+ LE edema, no UE edema R thigh AVG +bruit Neuro is alert, appropriate, nf  HD: TTS [facility] 4h 70min    450/800    130.5kg   2/2.0 [facility]  4   Heparin 4000   L thigh AVG Mircera 225 mcg last 2/23, Venofer 100/ tues,  Calcitriol 0.25 ug tiw po   Assessment: 1. Acute on chronic hypotension - she is well over dry wt today and still requiring pressors. On IV HC and po midodrine. BP threshold 70 per primary team. Lungs clear. No fever or signs of sepsis. Blood cx's are pending and not on abx. Would consider blood transfusion.  2. Anemia Hb low 7.6, on max Mircera and IV Fe, continue Fe, next esa due 1st of March 3. ESRD on HD, due for HD today.  4. Tuberous sclerosis 5. Seizure d/o - on medication 6. Polypharmacy - pharmacy is actively investigating to see if she really is taking this much medication 7. MR/ schizophrenia - she was considered not able to make decisions for herself in 2009 and was appt'd a guardian at that time during a hospital stay here. She doesn't know of having a guardian now but says her father is her POA.    Plan- HD tomorrow, limit IVF's as much as possible. Hoyer weight if possible.  Kelly Splinter MD (pgr) [phone]    [phone] 09/09/2014, 11:00 AM

## 2014-09-09 NOTE — Progress Notes (Signed)
eLink Physician-Brief Progress Note Patient Name: [name] DOB: Mar 25, 1956 MRN: [id number]   Date of Service  09/09/2014  HPI/Events of Note  Hypotension confirmed by arterial cath  eICU Interventions  1) Stat cortisol 2) Empiric hydrocortisone after cortisol level drawn 3) Echocardiogram 4) norepinephrine to maintain MAP > 50 mmHg (note: chronic component to hypotension)     Intervention Category Major Interventions: Shock - evaluation and management  Merton Border 09/09/2014, 4:20 AM

## 2014-09-09 NOTE — Progress Notes (Signed)
Subjective: Patient admitted for hypotension. Reports of hematochezia, which patient reports was mild and self-limited. Patient denies blood in stool since last night.  Objective: Vital signs in last 24 hours: Temp:  [97.5 F (36.4 C)-98.9 F (37.2 C)] 98.9 F (37.2 C) (02/25 0741) Pulse Rate:  [55-135] 59 (02/25 0900) Resp:  [0-36] 14 (02/25 0900) BP: (39-127)/(19-108) 92/48 mmHg (02/25 0900) SpO2:  [76 %-100 %] 98 % (02/25 0900) Arterial Line BP: (45-93)/(22-58) 84/37 mmHg (02/25 0700) FiO2 (%):  [31 %] 31 % (02/25 0500) Weight:  [130.636 kg (288 lb)-140.8 kg (310 lb 6.5 oz)] 140.8 kg (310 lb 6.5 oz) (02/25 0400) Weight change:  Last BM Date: 09/08/14  PE: GEN:  Obese, older-appearing than stated age HEENT:  CPAP in place ABD:  Obese, non-tender  Lab Results: CBC    Component Value Date/Time   WBC 4.6 09/09/2014 0421   RBC 2.35* 09/09/2014 0421   RBC 2.87* 12/18/2009 2249   HGB 7.6* 09/09/2014 0421   HCT 25.3* 09/09/2014 0421   PLT 143* 09/09/2014 0421   MCV 107.7* 09/09/2014 0421   MCH 32.3 09/09/2014 0421   MCHC 30.0 09/09/2014 0421   RDW 17.6* 09/09/2014 0421   LYMPHSABS 1.3 09/08/2014 2310   MONOABS 0.1 09/08/2014 2310   EOSABS 0.1 09/08/2014 2310   BASOSABS 0.0 09/08/2014 2310   CMP     Component Value Date/Time   NA 136 09/09/2014 0337   K 3.9 09/09/2014 0337   CL 97 09/09/2014 0337   CO2 26 09/09/2014 0337   GLUCOSE 115* 09/09/2014 0337   BUN 39* 09/09/2014 0337   CREATININE 5.91* 09/09/2014 0337   CALCIUM 8.8 09/09/2014 0337   CALCIUM 10.2 02/09/2010 1028   PROT 5.8* 09/09/2014 0337   ALBUMIN 2.9* 09/09/2014 0337   AST 13 09/09/2014 0337   ALT 9 09/09/2014 0337   ALKPHOS 230* 09/09/2014 0337   BILITOT 1.0 09/09/2014 0337   GFRNONAA 7* 09/09/2014 0337   GFRAA 8* 09/09/2014 0337   Assessment:  1.  Blood in stool.  Seems very mild, and chronic.  Don't see how this is playing any significant role into patient's hypotension. 2.  Anemia,  chronic, slight interval decrease since admission, likely dilutional. 3.  Hypotension, ongoing investigation at this time, apparently has happened in past after dialysis and there is concern about polypharmacy. Again, don't see any evidence to support GI tract bleeding as a significant cause of her hypotension.  Plan:  1.  Patient had colonoscopy in 2011 by Dr. Oletta Lamas for evaluation of hematochezia, which showed internal hemorrhoids (which were felt to be cause of hematochezia) otherwise unrevealing. 2.  Ok to try topical antihemorrhoidals (e.g., Proctofoam or Anusol-HC suppositories) as needed. 3.  Do not plan on any further GI tract evaluation at the present time. 4.  Will sign-off; please call with questions.   [name] M 09/09/2014, 10:00 AM

## 2014-09-09 NOTE — Progress Notes (Signed)
Pt arrived from ED via stretcher. Pt was given CHG bath and MRSA swab sent to lab. Vital signs: BP 60/29 and HR 61. Will continue to monitor.    Hart Rochester, RN, BSN

## 2014-09-09 NOTE — Significant Event (Signed)
Rapid Response Event Note Called per floor Charge RN regarding pt with consistent hypotension tonight. Per RN pt BP checked with manual cuffs on both arms with similar results. Orders received per PCCM to transfer to 68m for a-line to authenticate BP.  Overview: Time Called: 0224 Arrival Time: 0228 Event Type: Cardiac, Hypotension, Respiratory  Initial Focused Assessment: Pt found resting in bed lethargic, arouses to touch. Able to state name location and year, drifts off to sleep easily. Follows some commands. While asleep on nasal CPAP pt has snoring respirations. P02 Sat 88-94 on CPAP 6 LNC ? Per RN.   Interventions: Metallurgist to check manual BP while en route. 56/24  yielded. RN report provided to 41m over phone, pt moved to 2 M without incident. RRT update given to [place] 2 M.   Event Summary: Name of Physician Notified: PCCM DR.Simmonds called prior to my arrival at      at    Outcome: Transferred (Comment) (57m04 at 0250)  Event End Time: [place], [place]

## 2014-09-09 NOTE — Clinical Documentation Improvement (Signed)
"  Pt is anemic below her baseline" and "bright red rectal bleeding" documented in chart.  Please document the acuity and type of anemia present this admission in your progress note and carry over to the discharge summary.     Component      RBC Hemoglobin HCT  Latest Ref Rng      3.87 - 5.11 MIL/uL 12.0 - 15.0 g/dL 36.0 - 46.0 %  09/08/2014     12:17 PM 2.65 (L) 8.7 (L) 28.2 (L)  09/08/2014     11:10 PM 2.67 (L) 8.8 (L) 28.9 (L)  09/09/2014      2.35 (L) 7.6 (L) 25.3 (L)   Thank you, Mateo Flow, RN [phone] Clinical Documentation Specialist

## 2014-09-09 NOTE — Procedures (Signed)
Arterial Catheter Insertion Procedure Note JOVI ALVIZO [id number] 1955-12-03  Procedure: Insertion of Arterial Catheter  Indications: Blood pressure monitoring  Procedure Details Consent: Unable to obtain consent because of altered level of consciousness. Time Out: Verified patient identification, verified procedure, site/side was marked, verified correct patient position, special equipment/implants available, medications/allergies/relevent history reviewed, required imaging and test results available.  Performed  Maximum sterile technique was used including antiseptics, cap, gloves, gown, hand hygiene, mask and sheet. Skin prep: Chlorhexidine; local anesthetic administered 22 gauge catheter was inserted into right radial artery using the Seldinger technique.  Evaluation Blood flow good; BP tracing good. Complications: No apparent complications.   Sharen Hint 09/09/2014

## 2014-09-09 NOTE — Progress Notes (Signed)
UR Completed.  [phone]  

## 2014-09-09 NOTE — Progress Notes (Signed)
[place] TEAM 1 - Stepdown/ICU TEAM Progress Note  [name] NWG:[id number] DOB: [birth year] DOA: 09/08/2014 PCP: Alvester Morin, MD  Admit HPI / Brief Narrative: [name] is a 59 y.o. WF PMHx depression, mental retardation, end-stage renal disease (hemodialysis Tuesday/Thursday/Saturday), seizures, obstructive sleep apnea on CPAP, peptic ulcer disease. Who presents to the emergency department today after being sent from her neurologist's office for hypertension. The patient reports she has had frequent falls over the past year. Most recently she has been lightheaded and dizzy particularly in the morning. She states that she frequently has petit mal seizures which go unnoticed by those around her. She complains of migraine headaches, and bright red blood in her stools secondary to hemorrhoids. She states that she has started making urine again and is now urinating frequently.  In the ER, patient's blood pressure was 58/20. It improved to 127/108 with a 1 L bolus. The patient also had a lactic acid of 3.77.   HPI/Subjective: 2/25 A/O 4, states lives in nursing home in [place]. States has been having multiple episodes of LOC usually precipitated when she goes from sitting to standing position. States started occurring approximately 4 weeks ago. Did not have episode of LOC prior to admission yesterday however was feeling presyncopal.     Assessment/Plan: Lower GI bleed/hemorrhoids/acute blood loss anemia (unsure of baseline hemoglobin last checked 07/31/11= 10.4)  -2/26 transfuse 2 units PRBC well on HD -Per GIs note no further GI workup required. They are well aware of patient's hemorrhoids. Recommend Anusol suppositories BID  Syncope and collapse -Most likely multifactorial to include GI bleed, ESRD, adrenal insufficiency?, Iatrogenic (multiple pain/psychotropic medication), and Infection. -See lower GI bleed -Obtain TSH/free T4 -Patient already on steroids  therefore unable to determine if adrenal insufficiency. -Increase Florinef to 0.2 mg daily -Continue Midodrine 10 TID  -Echocardiogram pending -TED hose  Hypotension. -Not responsive to fluids and positive lactic acid of 3.77.  -Unfortunately per nephrology patient is significantly above her base weight, and is ESRD on HD therefore wIll need to watch fluid load carefully.  -DC qhs Xanax; Ambien 5 mg prn  -Patient does not meet criteria for sepsis, do not believe secondary to infection will DC antibiotics.  -Orthostatics ordered daily.  -Have requested physical therapy evaluation and orthostatics.  Seizures -Continue Lamictal 125 mg bid; obtain levels in the a.m. -Continue Keppra 1000 mg bid; obtain levels in the a.m.  ESRD on HD Tuesday/Thursday/Saturday -Dialysis per nephrology -Patient reporting increased urine output, obtain hemoglobin and see  Elevated alkaline phosphatase level -Other LFTs are within normal limits. -Requested alkaline phosphatase isoenzymes.    Code Status: FULL Family Communication: no family present at time of exam Disposition Plan: Stabilization of syncope, GI bleed    Consultants: Dr.William Outlaw ([facility]) Dr.Robert D Schertz (nephrology)   Procedure/Significant Events: 2/24 CT head without contrast; nondiagnostic   Culture    Antibiotics: Zosyn 2/24>> stopped 2/25 Vancomycin M/W/F>> stopped 2/25  DVT prophylaxis: Heparin   Devices    LINES / TUBES:      Continuous Infusions: . sodium chloride 10 mL/hr at 09/09/14 0300  . norepinephrine (LEVOPHED) Adult infusion 6 mcg/min (09/09/14 1815)    Objective: VITAL SIGNS: Temp: 99 F (37.2 C) (02/25 1615) Temp Source: Oral (02/25 1615) BP: 91/46 mmHg (02/25 1700) Pulse Rate: 59 (02/25 1700) SPO2; FIO2:   Intake/Output Summary (Last 24 hours) at 09/09/14 1917 Last data filed at 09/09/14 1700  Gross per 24 hour  Intake 677.78 ml  Output  0 ml  Net 677.78 ml      Exam: General: A/O 4, NAD, No acute respiratory distress Lungs: Clear to auscultation bilaterally without wheezes or crackles Cardiovascular: Regular rate and rhythm without murmur gallop or rub normal S1 and S2 Abdomen: Morbidly obese, Nontender, nondistended, soft, bowel sounds positive, no rebound, no ascites, no appreciable mass Extremities: No significant cyanosis, clubbing, or edema bilateral lower extremities  Data Reviewed: Basic Metabolic Panel:  Recent Labs Lab 09/08/14 1217 09/08/14 1920 09/09/14 0337  NA 136 135 136  K 3.7 3.9 3.9  CL 96 97 97  CO2 28 25 26   GLUCOSE 97 85 115*  BUN 34* 36* 39*  CREATININE 5.20* 5.45* 5.91*  CALCIUM 9.1 8.8 8.8   Liver Function Tests:  Recent Labs Lab 09/08/14 1217 09/08/14 1920 09/09/14 0337  AST 19 13 13   ALT 10 8 9   ALKPHOS 277* 251* 230*  BILITOT 1.0 1.0 1.0  PROT 6.9 6.4 5.8*  ALBUMIN 3.5 3.2* 2.9*   No results for input(s): LIPASE, AMYLASE in the last 168 hours. No results for input(s): AMMONIA in the last 168 hours. CBC:  Recent Labs Lab 09/08/14 1217 09/08/14 2310 09/09/14 0421  WBC 5.1 4.2 4.6  NEUTROABS 3.6 2.7  --   HGB 8.7* 8.8* 7.6*  HCT 28.2* 28.9* 25.3*  MCV 106.4* 108.2* 107.7*  PLT 152 146* 143*   Cardiac Enzymes:  Recent Labs Lab 09/08/14 1217  TROPONINI 0.04*   BNP (last 3 results) No results for input(s): BNP in the last 8760 hours.  ProBNP (last 3 results) No results for input(s): PROBNP in the last 8760 hours.  CBG:  Recent Labs Lab 09/09/14 0313 09/09/14 1107 09/09/14 1600  GLUCAP 110* 174* 152*    Recent Results (from the past 240 hour(s))  MRSA PCR Screening     Status: Abnormal   Collection Time: 09/08/14  8:32 PM  Result Value Ref Range Status   MRSA by PCR POSITIVE (A) NEGATIVE Final    Comment:        The GeneXpert MRSA Assay (FDA approved for NASAL specimens only), is one component of a comprehensive MRSA colonization surveillance program. It is  not intended to diagnose MRSA infection nor to guide or monitor treatment for MRSA infections. RESULT CALLED TO, READ BACK BY AND VERIFIED WITH: ENNIS,M RN [phone] AT 2309 SKEEN,P      Studies:  Recent x-ray studies have been reviewed in detail by the Attending Physician  Scheduled Meds:  Scheduled Meds: . ALPRAZolam  0.25 mg Oral Q T,Th,Sa-HD  . ALPRAZolam  0.5 mg Oral QHS  . antiseptic oral rinse  7 mL Mouth Rinse q12n4p  . budesonide-formoterol  2 puff Inhalation BID  . [START ON 09/11/2014] calcitRIOL  0.25 mcg Oral Q T,Th,Sa-HD  . calcium acetate  1,334 mg Oral TID WC  . chlorhexidine  15 mL Mouth Rinse BID  . Chlorhexidine Gluconate Cloth  6 each Topical Q0600  . cycloSPORINE  1 drop Both Eyes BID  . [START ON 09/14/2014] darbepoetin (ARANESP) injection - DIALYSIS  200 mcg Intravenous Q Tue-HD  . docusate sodium  100 mg Oral BID  . DULoxetine  30 mg Oral BID  . [START ON 09/14/2014] ferric gluconate (FERRLECIT/NULECIT) IV  125 mg Intravenous Q Tue-HD  . [START ON 09/10/2014] fludrocortisone  0.2 mg Oral Daily  . fluticasone  2 spray Each Nare Daily  . heparin  5,000 Units Subcutaneous 3 times per day  . hydrocortisone  25 mg Rectal  BID  . hydrocortisone  25 mg Rectal BID  . hydrocortisone sodium succinate  50 mg Intravenous Q6H  . lamoTRIgine  125 mg Oral BID  . levETIRAcetam  1,000 mg Oral BID  . midodrine  10 mg Oral TID WC  . multivitamin  1 tablet Oral QHS  . mupirocin ointment  1 application Nasal BID  . pantoprazole  40 mg Oral Daily  . polyethylene glycol  17 g Oral BID  . QUEtiapine  25 mg Oral BID  . risperiDONE  0.5 mg Oral QHS  . senna-docusate  1 tablet Oral BID  . sertraline  100 mg Oral QHS  . sevelamer carbonate  800 mg Oral TID WC  . sodium chloride  3 mL Intravenous Q12H  . topiramate  200 mg Oral BID    Time spent on care of this patient: 40 mins   Allie Bossier [facility]  [facility] Office  [phone] Pager -  [phone]  On-Call/Text Page:      [email or web address]      password [email or web address]  If 7PM-7AM, please contact night-coverage [email or web address] Password [email or web address] 09/09/2014, 7:17 PM   LOS: 1 day   Care during the described time interval was provided by me .  I have reviewed this patient's available data, including medical history, events of note, physical examination, radiology studies and test results as part of my evaluation  Dia Crawford, MD [phone] Pager

## 2014-09-10 DIAGNOSIS — E038 Other specified hypothyroidism: Secondary | ICD-10-CM | POA: Diagnosis present

## 2014-09-10 DIAGNOSIS — I9589 Other hypotension: Secondary | ICD-10-CM

## 2014-09-10 DIAGNOSIS — R001 Bradycardia, unspecified: Secondary | ICD-10-CM | POA: Diagnosis present

## 2014-09-10 LAB — CBC WITH DIFFERENTIAL/PLATELET
Basophils Absolute: 0 10*3/uL (ref 0.0–0.1)
Basophils Relative: 0 % (ref 0–1)
EOS ABS: 0 10*3/uL (ref 0.0–0.7)
EOS PCT: 0 % (ref 0–5)
HEMATOCRIT: 26.9 % — AB (ref 36.0–46.0)
HEMOGLOBIN: 8.3 g/dL — AB (ref 12.0–15.0)
LYMPHS ABS: 0.4 10*3/uL — AB (ref 0.7–4.0)
Lymphocytes Relative: 9 % — ABNORMAL LOW (ref 12–46)
MCH: 32.2 pg (ref 26.0–34.0)
MCHC: 30.9 g/dL (ref 30.0–36.0)
MCV: 104.3 fL — AB (ref 78.0–100.0)
MONOS PCT: 2 % — AB (ref 3–12)
Monocytes Absolute: 0.1 10*3/uL (ref 0.1–1.0)
Neutro Abs: 4 10*3/uL (ref 1.7–7.7)
Neutrophils Relative %: 89 % — ABNORMAL HIGH (ref 43–77)
Platelets: 147 10*3/uL — ABNORMAL LOW (ref 150–400)
RBC: 2.58 MIL/uL — ABNORMAL LOW (ref 3.87–5.11)
RDW: 16.9 % — ABNORMAL HIGH (ref 11.5–15.5)
WBC: 4.5 10*3/uL (ref 4.0–10.5)

## 2014-09-10 LAB — PHOSPHORUS: Phosphorus: 5.2 mg/dL — ABNORMAL HIGH (ref 2.3–4.6)

## 2014-09-10 LAB — COMPREHENSIVE METABOLIC PANEL
ALT: 8 U/L (ref 0–35)
AST: 13 U/L (ref 0–37)
Albumin: 3 g/dL — ABNORMAL LOW (ref 3.5–5.2)
Alkaline Phosphatase: 252 U/L — ABNORMAL HIGH (ref 39–117)
Anion gap: 13 (ref 5–15)
BILIRUBIN TOTAL: 0.8 mg/dL (ref 0.3–1.2)
BUN: 53 mg/dL — ABNORMAL HIGH (ref 6–23)
CALCIUM: 8 mg/dL — AB (ref 8.4–10.5)
CO2: 27 mmol/L (ref 19–32)
Chloride: 92 mmol/L — ABNORMAL LOW (ref 96–112)
Creatinine, Ser: 7.58 mg/dL — ABNORMAL HIGH (ref 0.50–1.10)
GFR, EST AFRICAN AMERICAN: 6 mL/min — AB (ref >90)
GFR, EST NON AFRICAN AMERICAN: 5 mL/min — AB (ref >90)
GLUCOSE: 137 mg/dL — AB (ref 70–99)
Potassium: 4.7 mmol/L (ref 3.5–5.1)
SODIUM: 132 mmol/L — AB (ref 135–145)
Total Protein: 6.4 g/dL (ref 6.0–8.3)

## 2014-09-10 LAB — MAGNESIUM: MAGNESIUM: 2 mg/dL (ref 1.5–2.5)

## 2014-09-10 LAB — LACTIC ACID, PLASMA: Lactic Acid, Venous: 1 mmol/L (ref 0.5–2.0)

## 2014-09-10 LAB — TSH: TSH: 1.041 u[IU]/mL (ref 0.350–4.500)

## 2014-09-10 LAB — CORTISOL
Cortisol, Plasma: 16 ug/dL
Cortisol, Plasma: 91.1 ug/dL

## 2014-09-10 LAB — T4, FREE: Free T4: 0.57 ng/dL — ABNORMAL LOW (ref 0.80–1.80)

## 2014-09-10 MED ORDER — LIDOCAINE-PRILOCAINE 2.5-2.5 % EX CREA: 1.0000 "application " | TOPICAL_CREAM | CUTANEOUS | Status: DC | PRN

## 2014-09-10 MED ORDER — PENTAFLUOROPROP-TETRAFLUOROETH EX AERO: 1.0000 "application " | INHALATION_SPRAY | CUTANEOUS | Status: DC | PRN

## 2014-09-10 MED ORDER — HEPARIN SODIUM (PORCINE) 1000 UNIT/ML DIALYSIS: 4000.0000 [IU] | Freq: Once | INTRAMUSCULAR | Status: DC

## 2014-09-10 MED ORDER — SODIUM CHLORIDE 0.9 % IV SOLN: 100.0000 mL | INTRAVENOUS | Status: DC | PRN

## 2014-09-10 MED ORDER — HEPARIN SODIUM (PORCINE) 1000 UNIT/ML DIALYSIS: 1000.0000 [IU] | INTRAMUSCULAR | Status: DC | PRN

## 2014-09-10 MED ORDER — ALTEPLASE 2 MG IJ SOLR
2.0000 mg | Freq: Once | INTRAMUSCULAR | Status: DC | PRN
Start: 2014-09-10 — End: 2014-09-10

## 2014-09-10 MED ORDER — LIDOCAINE HCL (PF) 1 % IJ SOLN: 5.0000 mL | INTRAMUSCULAR | Status: DC | PRN

## 2014-09-10 MED ORDER — ALTEPLASE 2 MG IJ SOLR
2.0000 mg | Freq: Once | INTRAMUSCULAR | Status: AC | PRN
  Filled 2014-09-10: qty 2

## 2014-09-10 MED ORDER — LEVOTHYROXINE SODIUM 25 MCG PO TABS
25.0000 ug | ORAL_TABLET | Freq: Every day | ORAL | Status: DC
  Filled 2014-09-10: qty 1

## 2014-09-10 MED ORDER — NEPRO/CARBSTEADY PO LIQD: 237.0000 mL | ORAL | Status: DC | PRN

## 2014-09-10 MED ORDER — ALBUMIN HUMAN 25 % IV SOLN
INTRAVENOUS | Status: AC
  Administered 2014-09-10: 12.5 g
  Filled 2014-09-10: qty 100

## 2014-09-10 MED ORDER — PERFLUTREN LIPID MICROSPHERE
1.0000 mL | INTRAVENOUS | Status: AC | PRN
  Administered 2014-09-10: 2 mL via INTRAVENOUS
  Filled 2014-09-10: qty 10

## 2014-09-10 MED ORDER — LEVOTHYROXINE SODIUM 50 MCG PO TABS
50.0000 ug | ORAL_TABLET | Freq: Every day | ORAL | Status: DC
  Administered 2014-09-11 – 2014-09-14 (×4): 50 ug via ORAL
  Filled 2014-09-10 (×5): qty 1

## 2014-09-10 MED ORDER — ALTEPLASE 2 MG IJ SOLR
2.0000 mg | Freq: Once | INTRAMUSCULAR | Status: DC | PRN
  Filled 2014-09-10: qty 2

## 2014-09-10 MED ORDER — LEVOTHYROXINE SODIUM 25 MCG PO TABS: 25.0000 ug | ORAL_TABLET | Freq: Every day | ORAL | Status: DC

## 2014-09-10 MED ORDER — NEPRO/CARBSTEADY PO LIQD
237.0000 mL | ORAL | Status: DC | PRN
  Filled 2014-09-10: qty 237

## 2014-09-10 MED ORDER — HYDROCODONE-ACETAMINOPHEN 5-325 MG PO TABS: 1.0000 | ORAL_TABLET | Freq: Four times a day (QID) | ORAL | Status: DC | PRN

## 2014-09-10 MED ORDER — MIDODRINE HCL 5 MG PO TABS
ORAL_TABLET | ORAL | Status: AC
  Filled 2014-09-10: qty 2

## 2014-09-10 MED ORDER — HEPARIN SODIUM (PORCINE) 1000 UNIT/ML DIALYSIS
4000.0000 [IU] | Freq: Once | INTRAMUSCULAR | Status: DC
  Filled 2014-09-10: qty 4

## 2014-09-10 MED ORDER — HEPARIN SODIUM (PORCINE) 1000 UNIT/ML DIALYSIS
1000.0000 [IU] | INTRAMUSCULAR | Status: DC | PRN
  Filled 2014-09-10: qty 1

## 2014-09-10 NOTE — Progress Notes (Signed)
PT Cancellation Note  Patient Details Name: [name] MRN: [id number] DOB: 1955-09-17   Cancelled Treatment:    Reason Eval/Treat Not Completed: Medical issues which prohibited therapy (Pt remains with hyptension on pressor support with elevated Creatnine and not yet medically appropriate)   Melford Aase 09/10/2014, 7:14 AM Elwyn Reach, [place]

## 2014-09-10 NOTE — Progress Notes (Signed)
Hold blood transfusion today, draw CBC and RFP tomorrow prior to HD and transfuse if HGB low, per Andreas Blower, NP.

## 2014-09-10 NOTE — Progress Notes (Signed)
[facility] Progress Note   Subjective: no complaints, off pressors, BP 80's  Filed Vitals:   09/10/14 1000 09/10/14 1045 09/10/14 1050 09/10/14 1051  BP: 67/34 79/43  79/43  Pulse: 59 67  60  Temp:      TempSrc:      Resp: 17 15  26   Height:      Weight:      SpO2: 96% 100% 100%    Exam: Alert, no distress, calm No jvd Chest clear bilat RRR no RMG Abd obese, large upper mid abdomen hernia w opened wound 1x 4 cm, no drainage 1+ LE edema, no UE edema R thigh AVG +bruit Neuro is alert, appropriate, nf  ECHo 2011 > LVEF 60%, LVH, normal RV function/ size  HD: TTS [facility] 4h 58min 450/800 130.5kg 2/2.0 Bath Profile 4 Heparin 4000 L thigh AVG Mircera 225 mcg last 2/23, Venofer 100/ tues, Calcitriol 0.25 ug tiw po   Assessment: 1. Acute on chronic hypotension - better, off pressors, remains on midodrine and IV HC.  Blood cx's negative, no signs of sepsis. Last ECHO normal LV/RV but that was in 2011, will repeat.    2. Volume - 12 kg up today on standing scale, but no resp distress. Will try to get some fluid off w HD today 3. Anemia Hb low 8.3, on max Mircera and IV Fe, continue Fe, next esa due 1st of March 4. ESRD on HD, due for HD today.  5. Tuberous sclerosis 6. Seizure d/o - on medication 7. Polypharmacy 8. MR/ schizophrenia/ anxiety / depression - on multiple meds  Plan - HD today upstairs, UF as tolerated, echo.    Kelly Splinter MD  pager [phone]    cell [phone]  09/10/2014, 12:11 PM     Recent Labs Lab 09/08/14 1920 09/09/14 0337 09/10/14 0240  NA 135 136 132*  K 3.9 3.9 4.7  CL 97 97 92*  CO2 25 26 27   GLUCOSE 85 115* 137*  BUN 36* 39* 53*  CREATININE 5.45* 5.91* 7.58*  CALCIUM 8.8 8.8 8.0*  PHOS  --   --  5.2*    Recent Labs Lab 09/08/14 1920 09/09/14 0337 09/10/14 0240  AST 13 13 13   ALT 8 9 8   ALKPHOS 251* 230* 252*  BILITOT 1.0 1.0 0.8  PROT 6.4 5.8* 6.4  ALBUMIN 3.2* 2.9* 3.0*    Recent Labs Lab  09/08/14 1217 09/08/14 2310 09/09/14 0421 09/10/14 0240  WBC 5.1 4.2 4.6 4.5  NEUTROABS 3.6 2.7  --  4.0  HGB 8.7* 8.8* 7.6* 8.3*  HCT 28.2* 28.9* 25.3* 26.9*  MCV 106.4* 108.2* 107.7* 104.3*  PLT 152 146* 143* 147*   . sodium chloride   Intravenous Once  . ALPRAZolam  0.25 mg Oral Q T,Th,Sa-HD  . ALPRAZolam  0.5 mg Oral QHS  . antiseptic oral rinse  7 mL Mouth Rinse q12n4p  . budesonide-formoterol  2 puff Inhalation BID  . [START ON 09/11/2014] calcitRIOL  0.25 mcg Oral Q T,Th,Sa-HD  . calcium acetate  1,334 mg Oral TID WC  . chlorhexidine  15 mL Mouth Rinse BID  . Chlorhexidine Gluconate Cloth  6 each Topical Q0600  . cycloSPORINE  1 drop Both Eyes BID  . [START ON 09/14/2014] darbepoetin (ARANESP) injection - DIALYSIS  200 mcg Intravenous Q Tue-HD  . docusate sodium  100 mg Oral BID  . DULoxetine  30 mg Oral BID  . [START ON 09/14/2014] ferric gluconate (FERRLECIT/NULECIT) IV  125 mg Intravenous  Q Tue-HD  . fludrocortisone  0.2 mg Oral Daily  . fluticasone  2 spray Each Nare Daily  . heparin  5,000 Units Subcutaneous 3 times per day  . hydrocortisone  25 mg Rectal BID  . hydrocortisone sodium succinate  50 mg Intravenous Q6H  . lamoTRIgine  125 mg Oral BID  . levETIRAcetam  1,000 mg Oral BID  . midodrine  10 mg Oral TID WC  . multivitamin  1 tablet Oral QHS  . mupirocin ointment  1 application Nasal BID  . pantoprazole  40 mg Oral Daily  . polyethylene glycol  17 g Oral BID  . QUEtiapine  25 mg Oral BID  . risperiDONE  0.5 mg Oral QHS  . senna-docusate  1 tablet Oral BID  . sertraline  100 mg Oral QHS  . sevelamer carbonate  800 mg Oral TID WC  . sodium chloride  3 mL Intravenous Q12H  . topiramate  200 mg Oral BID   . sodium chloride 10 mL/hr at 09/09/14 2249  . norepinephrine (LEVOPHED) Adult infusion Stopped (09/10/14 0100)   acetaminophen **OR** acetaminophen, HYDROcodone-acetaminophen, ipratropium-albuterol, ondansetron **OR** ondansetron (ZOFRAN) IV, traMADol,  zolpidem

## 2014-09-10 NOTE — Progress Notes (Signed)
PULMONARY / CRITICAL CARE MEDICINE   Name: [name] MRN: [id number] DOB: 22-Jul-1955    ADMISSION DATE:  09/08/2014 CONSULTATION DATE:  09/08/2014  REFERRING MD : Dr Clementeen Graham  CHIEF COMPLAINT:  Hypotension  INITIAL PRESENTATION: 59 year old female, ESRD on HD, presented to [facility] ED 2/24 with dizziness one day after most recent dialysis treatment. In ED found to be hypotensive. Some improvement with gentle IVF resuscitation. PCCM to consult.   STUDIES:  CT head 2/24 > No acute abnormality noted. Stable subependymal calcifications  SIGNIFICANT EVENTS: 2/23 dialysis with seizure activity (history of same) 2/24 Admitted for hypotension  SUBJECTIVE: Remains hypotensive overnight, levophed is up to 20, history of hypotension.  VITAL SIGNS: Temp:  [97.4 F (36.3 C)-99 F (37.2 C)] 97.9 F (36.6 C) (02/26 0847) Pulse Rate:  [49-66] 60 (02/26 0800) Resp:  [9-25] 17 (02/26 0800) BP: (51-108)/(20-65) 84/47 mmHg (02/26 0800) SpO2:  [93 %-100 %] 100 % (02/26 0800) Arterial Line BP: (58-96)/(26-54) 81/39 mmHg (02/26 0800) HEMODYNAMICS:   VENTILATOR SETTINGS:   INTAKE / OUTPUT:  Intake/Output Summary (Last 24 hours) at 09/10/14 3810 Last data filed at 09/10/14 0800  Gross per 24 hour  Intake  747.3 ml  Output      0 ml  Net  747.3 ml   PHYSICAL EXAMINATION: General:  Morbidly obese female in NAD Neuro:  Alert, oriented x 3, non-focal HEENT:  [place]/AT, PERRL, no JVD noted Cardiovascular:  RRR, no MRG Lungs:  Clear bilateral breath sounds Abdomen:  Soft, non-tender, non-distended. Obese Musculoskeletal:  No acute deformity Skin:  Old graft sites to BUE. Current graft to R groin. + thrill  LABS:  CBC  Recent Labs Lab 09/08/14 2310 09/09/14 0421 09/10/14 0240  WBC 4.2 4.6 4.5  HGB 8.8* 7.6* 8.3*  HCT 28.9* 25.3* 26.9*  PLT 146* 143* 147*   Coag's  Recent Labs Lab 09/08/14 1920 09/08/14 2153  APTT  --  34  INR 1.11  --    BMET  Recent Labs Lab  09/08/14 1920 09/09/14 0337 09/10/14 0240  NA 135 136 132*  K 3.9 3.9 4.7  CL 97 97 92*  CO2 25 26 27   BUN 36* 39* 53*  CREATININE 5.45* 5.91* 7.58*  GLUCOSE 85 115* 137*   Electrolytes  Recent Labs Lab 09/08/14 1920 09/09/14 0337 09/10/14 0240  CALCIUM 8.8 8.8 8.0*  MG  --   --  2.0  PHOS  --   --  5.2*   Sepsis Markers  Recent Labs Lab 09/08/14 1920 09/08/14 2153 09/10/14 0240  LATICACIDVEN 1.4 2.0 1.0  PROCALCITON 0.70  --   --    ABG No results for input(s): PHART, PCO2ART, PO2ART in the last 168 hours. Liver Enzymes  Recent Labs Lab 09/08/14 1920 09/09/14 0337 09/10/14 0240  AST 13 13 13   ALT 8 9 8   ALKPHOS 251* 230* 252*  BILITOT 1.0 1.0 0.8  ALBUMIN 3.2* 2.9* 3.0*   Cardiac Enzymes  Recent Labs Lab 09/08/14 1217  TROPONINI 0.04*   Glucose  Recent Labs Lab 09/09/14 0313 09/09/14 1107 09/09/14 1600 09/09/14 2000  GLUCAP 110* 174* 152* 132*    Imaging No results found.   ASSESSMENT / PLAN:  Hypotension - suspect this is secondary to volume removal, she reports that this has happened before in the past and usually resolves. She has now gotten 1L of IVF resuscitation, BP cuff changed to appropriate size and location, and systolic pressure has improved to 90 mm/hg with MAP  61.   Rec: - KVO IVF. - Close monitoring of respiratory status. - Pressors off since last night with addition of midodrine, stress dose steroids and florinef. - Cortisol level 7.9, adrenally insufficient, will need conversion of PO steroids prednisone 60 mg PO daily. - Hydrocortisone 50 mg IV q6 hours. - Midodrine 10 mg PO TID. - Florinef 0.2 mg daily. - Will accept SBP of 70 as long as mental status remain intact.  Alk Phos elevation - no complaints of abdominal sx/pain/tenderness. - Trend. - Replace electrolytes as indicated.  Troponin elevation - appears chronic, paced rhythm, no CP complaints. - Trend troponin - Consider cardiology consultation for ECG  findings and chronically elevated troponin - EKG unchanged.  Will transfer to SDU and to [facility], PCCM will sign off 2/26.  Rush Farmer, M.D. [facility] Pulmonary/Critical Care Medicine. Pager: [phone]. After hours pager: [phone].

## 2014-09-10 NOTE — Procedures (Signed)
Pt hypotensive, Dr. Jonnie Finner aware, albumin and midodrine administered at beginning of tx, bradycardia, pulse fluctuating low 40"s- high 60"s. Will monitor closely and diligently.

## 2014-09-10 NOTE — Progress Notes (Signed)
Blood not given in hemo...see previous note. Dr. Sherral Hammers wants one unit of blood to be transfused. Oncoming shift made aware.

## 2014-09-10 NOTE — Progress Notes (Signed)
OT Cancellation Note  Patient Details Name: [name] MRN: [id number] DOB: 13-Feb-1956   Cancelled Treatment:    Reason Eval/Treat Not Completed: Medical issues which prohibited therapy. (Pt remains with hyptension on pressor support with elevated Creatnine and not yet medically appropriate).   Almon Register [phone] 09/10/2014, 7:24 AM

## 2014-09-10 NOTE — Progress Notes (Signed)
[name] - Stepdown/ICU TEAM Progress Note  [name]:[id number] DOB: [birth year] DOA: 09/08/2014 PCP: Alvester Morin, MD  Admit HPI / Brief Narrative: [name] is a 59 y.o. WF PMHx depression, mental retardation, end-stage renal disease (hemodialysis Tuesday/Thursday/Saturday), seizures, obstructive sleep apnea on CPAP, peptic ulcer disease. Who presents to the emergency department today after being sent from her neurologist's office for hypertension. The patient reports she has had frequent falls over the past year. Most recently she has been lightheaded and dizzy particularly in the morning. She states that she frequently has petit mal seizures which go unnoticed by those around her. She complains of migraine headaches, and bright red blood in her stools secondary to hemorrhoids. She states that she has started making urine again and is now urinating frequently.  In the ER, patient's blood pressure was 58/20. It improved to 127/108 with a Stone L bolus. The patient also had a lactic acid of 3.77.   HPI/Subjective: 2/26 A/O 4, states lives in [place] home in [place]. States continues to feel light headed. Negative CP, negative SOB.    Assessment/Plan: Lower GI bleed/hemorrhoids/acute blood loss anemia (unsure of baseline hemoglobin last checked Stone/15/13= 10.4)  -2/26 transfuse 2 units PRBC well on HD; Addendum blood was not transfused at hemodialysis.  - Will transfuse Stone unit PRBC on floor. -Per GIs note no further GI workup required. They are well aware of patient's hemorrhoids. Recommend Anusol suppositories BID  Syncope and collapse -Most likely multifactorial to include GI bleed, ESRD, adrenal insufficiency?, Iatrogenic (multiple pain/psychotropic medication), and Infection. -See lower GI bleed -Patient already on steroids therefore unable to determine if adrenal insufficiency. -Continue Florinef to 0.2 mg daily -Continue Midodrine  10 TID  -Echocardiogram see results below -TED hose -Free T4 low/TSH low normal  Hypothyroidism -Start levothyroxine 50 g daily  Hypotension. -Not responsive to fluids and positive lactic acid of 3.77.  -Unfortunately per nephrology patient is significantly above her base weight, and is ESRD on HD therefore wIll need to watch fluid load carefully.  -DC qhs Xanax; Ambien 5 mg prn  -Patient does not meet criteria for sepsis, do not believe secondary to infection will DC antibiotics.  -Orthostatics ordered daily.  -Have requested physical therapy evaluation and orthostatics if patient can tolerate.  Bradycardia (symptomatic) -Patient constantly bradycardic, not on medication which should cause bradycardia. Will consult cardiology in a.m. pacer placement?  Seizures -Continue Lamictal 125 mg bid; obtain levels in the a.m. -Continue Keppra 1000 mg bid; obtain levels in the a.m.  ESRD on HD Tuesday/Thursday/Saturday -Dialysis per nephrology -Patient reporting increased urine output,   Elevated alkaline phosphatase level -Other LFTs are within normal limits. -Requested alkaline phosphatase isoenzymes pending.    Code Status: FULL Family Communication: no family present at time of exam Disposition Plan: Stabilization of syncope, GI bleed    Consultants: Dr.William Outlaw ([facility]) Dr.Robert D Schertz (nephrology)   Procedure/Significant Events: 2/24 CT head without contrast; nondiagnostic 2/26 transfuse Stone unit PRBC 2/26 echocardiogram;- Left ventricle: mildly dilated. mild LVH. Systolic -OJJK=09% to 38%.   Culture    Antibiotics: Zosyn 2/24>> stopped 2/25 Vancomycin M/W/F>> stopped 2/25  DVT prophylaxis: Heparin   Devices    LINES / TUBES:      Continuous Infusions: . sodium chloride 10 mL/hr at 09/09/14 2249  . norepinephrine (LEVOPHED) Adult infusion Stopped (09/10/14 0100)    Objective: VITAL SIGNS: Temp: 97.9 F (36.6 C) (02/26 0847) Temp  Source: Oral (02/26 0847) BP: 84/47 mmHg (  02/26 0800) Pulse Rate: 60 (02/26 0800) SPO2; FIO2:   Intake/Output Summary (Last 24 hours) at 09/10/14 4166 Last data filed at 09/10/14 0800  Gross per 24 hour  Intake  747.3 ml  Output      0 ml  Net  747.3 ml     Exam: General: A/O 4, NAD, No acute respiratory distress Lungs: Clear to auscultation bilaterally without wheezes or crackles Cardiovascular: Regular rate and rhythm without murmur gallop or rub normal S1 and S2 Abdomen: Morbidly obese, Nontender, nondistended, soft, bowel sounds positive, no rebound, no ascites, no appreciable mass Extremities: No significant cyanosis, clubbing, or edema bilateral lower extremities  Data Reviewed: Basic Metabolic Panel:  Recent Labs Lab 09/08/14 1217 09/08/14 1920 09/09/14 0337 09/10/14 0240  NA 136 135 136 132*  K 3.7 3.9 3.9 4.7  CL 96 97 97 92*  CO2 28 25 26 27   GLUCOSE 97 85 115* 137*  BUN 34* 36* 39* 53*  CREATININE 5.20* 5.45* 5.91* 7.58*  CALCIUM 9.Stone 8.8 8.8 8.0*  MG  --   --   --  2.0  PHOS  --   --   --  5.2*   Liver Function Tests:  Recent Labs Lab 09/08/14 1217 09/08/14 1920 09/09/14 0337 09/10/14 0240  AST 19 13 13 13   ALT 10 8 9 8   ALKPHOS 277* 251* 230* 252*  BILITOT Stone.0 Stone.0 Stone.0 0.8  PROT 6.9 6.4 5.8* 6.4  ALBUMIN 3.5 3.2* 2.9* 3.0*   No results for input(s): LIPASE, AMYLASE in the last 168 hours. No results for input(s): AMMONIA in the last 168 hours. CBC:  Recent Labs Lab 09/08/14 1217 09/08/14 2310 09/09/14 0421 09/10/14 0240  WBC 5.Stone 4.2 4.6 4.5  NEUTROABS 3.6 2.7  --  4.0  HGB 8.7* 8.8* 7.6* 8.3*  HCT 28.2* 28.9* 25.3* 26.9*  MCV 106.4* 108.2* 107.7* 104.3*  PLT 152 146* 143* 147*   Cardiac Enzymes:  Recent Labs Lab 09/08/14 1217  TROPONINI 0.04*   BNP (last 3 results) No results for input(s): BNP in the last 8760 hours.  ProBNP (last 3 results) No results for input(s): PROBNP in the last 8760 hours.  CBG:  Recent  Labs Lab 09/09/14 0313 09/09/14 1107 09/09/14 1600 09/09/14 2000  GLUCAP 110* 174* 152* 132*    Recent Results (from the past 240 hour(s))  Culture, blood (routine x 2)     Status: None (Preliminary result)   Collection Time: 09/08/14  6:00 PM  Result Value Ref Range Status   Specimen Description BLOOD HAND LEFT  Final   Special Requests BOTTLES DRAWN AEROBIC AND ANAEROBIC 5CC  Final   Culture   Final           BLOOD CULTURE RECEIVED NO GROWTH TO DATE CULTURE WILL BE HELD FOR 5 DAYS BEFORE ISSUING A FINAL NEGATIVE REPORT Performed at [facility]    Report Status PENDING  Incomplete  Culture, blood (routine x 2)     Status: None (Preliminary result)   Collection Time: 09/08/14  6:15 PM  Result Value Ref Range Status   Specimen Description BLOOD ARM LEFT  Final   Special Requests BOTTLES DRAWN AEROBIC ONLY 5CC  Final   Culture   Final           BLOOD CULTURE RECEIVED NO GROWTH TO DATE CULTURE WILL BE HELD FOR 5 DAYS BEFORE ISSUING A FINAL NEGATIVE REPORT Performed at [facility]    Report Status PENDING  Incomplete  MRSA  PCR Screening     Status: Abnormal   Collection Time: 09/08/14  8:32 PM  Result Value Ref Range Status   MRSA by PCR POSITIVE (A) NEGATIVE Final    Comment:        The GeneXpert MRSA Assay (FDA approved for NASAL specimens only), is one component of a comprehensive MRSA colonization surveillance program. It is not intended to diagnose MRSA infection nor to guide or monitor treatment for MRSA infections. RESULT CALLED TO, READ BACK BY AND VERIFIED WITH: ENNIS,M RN [phone] AT 2309 SKEEN,P      Studies:  Recent x-ray studies have been reviewed in detail by the Attending Physician  Scheduled Meds:  Scheduled Meds: . sodium chloride   Intravenous Once  . ALPRAZolam  0.25 mg Oral Q T,Th,Sa-HD  . ALPRAZolam  0.5 mg Oral QHS  . antiseptic oral rinse  7 mL Mouth Rinse q12n4p  . budesonide-formoterol  2 puff Inhalation BID  . [START  ON 09/11/2014] calcitRIOL  0.25 mcg Oral Q T,Th,Sa-HD  . calcium acetate  Stone,334 mg Oral TID WC  . chlorhexidine  15 mL Mouth Rinse BID  . Chlorhexidine Gluconate Cloth  6 each Topical Q0600  . cycloSPORINE  Stone drop Both Eyes BID  . [START ON 3/Stone/2016] darbepoetin (ARANESP) injection - DIALYSIS  200 mcg Intravenous Q Tue-HD  . docusate sodium  100 mg Oral BID  . DULoxetine  30 mg Oral BID  . [START ON 3/Stone/2016] ferric gluconate (FERRLECIT/NULECIT) IV  125 mg Intravenous Q Tue-HD  . fludrocortisone  0.2 mg Oral Daily  . fluticasone  2 spray Each Nare Daily  . heparin  5,000 Units Subcutaneous 3 times per day  . hydrocortisone  25 mg Rectal BID  . hydrocortisone sodium succinate  50 mg Intravenous Q6H  . lamoTRIgine  125 mg Oral BID  . levETIRAcetam  Stone,000 mg Oral BID  . midodrine  10 mg Oral TID WC  . multivitamin  Stone tablet Oral QHS  . mupirocin ointment  Stone application Nasal BID  . pantoprazole  40 mg Oral Daily  . polyethylene glycol  17 g Oral BID  . QUEtiapine  25 mg Oral BID  . risperiDONE  0.5 mg Oral QHS  . senna-docusate  Stone tablet Oral BID  . sertraline  100 mg Oral QHS  . sevelamer carbonate  800 mg Oral TID WC  . sodium chloride  3 mL Intravenous Q12H  . topiramate  200 mg Oral BID    Time spent on care of this patient: 40 mins   Allie Bossier [facility]  [facility] Office  [phone] Pager - [phone]  On-Call/Text Page:      [email or web address]      password [email or web address]  If 7PM-7AM, please contact night-coverage [email or web address] Password [facility] 09/10/2014, 9:16 AM   LOS: 2 days   Care during the described time interval was provided by me .  I have reviewed this patient's available data, including medical history, events of note, physical examination, radiology studies and test results as part of my evaluation  Dia Crawford, MD [phone] Pager

## 2014-09-10 NOTE — Progress Notes (Signed)
  Echocardiogram 2D Echocardiogram has been performed.  Darlina Sicilian M 09/10/2014, 10:18 AM

## 2014-09-11 DIAGNOSIS — K648 Other hemorrhoids: Secondary | ICD-10-CM

## 2014-09-11 LAB — BASIC METABOLIC PANEL
ANION GAP: 8 (ref 5–15)
BUN: 33 mg/dL — ABNORMAL HIGH (ref 6–23)
CHLORIDE: 99 mmol/L (ref 96–112)
CO2: 29 mmol/L (ref 19–32)
CREATININE: 5.64 mg/dL — AB (ref 0.50–1.10)
Calcium: 8.3 mg/dL — ABNORMAL LOW (ref 8.4–10.5)
GFR calc Af Amer: 9 mL/min — ABNORMAL LOW (ref >90)
GFR, EST NON AFRICAN AMERICAN: 8 mL/min — AB (ref >90)
Glucose, Bld: 110 mg/dL — ABNORMAL HIGH (ref 70–99)
POTASSIUM: 4.2 mmol/L (ref 3.5–5.1)
Sodium: 136 mmol/L (ref 135–145)

## 2014-09-11 LAB — CBC
HEMATOCRIT: 29.2 % — AB (ref 36.0–46.0)
Hemoglobin: 9 g/dL — ABNORMAL LOW (ref 12.0–15.0)
MCH: 31.7 pg (ref 26.0–34.0)
MCHC: 30.8 g/dL (ref 30.0–36.0)
MCV: 102.8 fL — ABNORMAL HIGH (ref 78.0–100.0)
Platelets: 150 10*3/uL (ref 150–400)
RBC: 2.84 MIL/uL — ABNORMAL LOW (ref 3.87–5.11)
RDW: 18.9 % — ABNORMAL HIGH (ref 11.5–15.5)
WBC: 4.8 10*3/uL (ref 4.0–10.5)

## 2014-09-11 LAB — HEMOGLOBIN A1C
HEMOGLOBIN A1C: 5.3 % (ref 4.8–5.6)
Mean Plasma Glucose: 105 mg/dL

## 2014-09-11 LAB — MAGNESIUM: MAGNESIUM: 2.1 mg/dL (ref 1.5–2.5)

## 2014-09-11 LAB — CLOSTRIDIUM DIFFICILE BY PCR: Toxigenic C. Difficile by PCR: NEGATIVE

## 2014-09-11 MED ORDER — LEVETIRACETAM 750 MG PO TABS
1500.0000 mg | ORAL_TABLET | ORAL | Status: DC
  Administered 2014-09-14: 1500 mg via ORAL
  Filled 2014-09-11: qty 2

## 2014-09-11 MED ORDER — LAMOTRIGINE 25 MG PO TABS
50.0000 mg | ORAL_TABLET | Freq: Every day | ORAL | Status: DC
  Administered 2014-09-12 – 2014-09-13 (×2): 50 mg via ORAL
  Filled 2014-09-11 (×3): qty 2

## 2014-09-11 MED ORDER — SERTRALINE HCL 50 MG PO TABS
150.0000 mg | ORAL_TABLET | Freq: Every day | ORAL | Status: DC
  Administered 2014-09-11 – 2014-09-13 (×3): 150 mg via ORAL
  Filled 2014-09-11 (×4): qty 1

## 2014-09-11 MED ORDER — ALBUMIN HUMAN 25 % IV SOLN
INTRAVENOUS | Status: AC
  Administered 2014-09-11: 14:00:00
  Filled 2014-09-11: qty 100

## 2014-09-11 MED ORDER — LEVETIRACETAM 500 MG PO TABS
500.0000 mg | ORAL_TABLET | Freq: Once | ORAL | Status: AC
  Administered 2014-09-11: 500 mg via ORAL
  Filled 2014-09-11: qty 1

## 2014-09-11 MED ORDER — GABAPENTIN 100 MG PO CAPS
100.0000 mg | ORAL_CAPSULE | Freq: Two times a day (BID) | ORAL | Status: DC
  Administered 2014-09-11 – 2014-09-14 (×6): 100 mg via ORAL
  Filled 2014-09-11 (×8): qty 1

## 2014-09-11 MED ORDER — LEVETIRACETAM 500 MG PO TABS
1000.0000 mg | ORAL_TABLET | ORAL | Status: DC
  Administered 2014-09-12 – 2014-09-13 (×2): 1000 mg via ORAL
  Filled 2014-09-11 (×2): qty 2

## 2014-09-11 NOTE — Progress Notes (Signed)
[facility] Progress Note   Subjective: no complaints, off pressors, BP 80's  Filed Vitals:   09/11/14 0400 09/11/14 0500 09/11/14 0830 09/11/14 0922  BP: 78/48  86/43   Pulse: 59  60   Temp:   98.1 F (36.7 C)   TempSrc:   Oral   Resp: 11  18   Height:      Weight:  139.799 kg (308 lb 3.2 oz)    SpO2: 96%  97% 97%   Exam: Alert, no distress, calm No jvd Chest clear bilat RRR no RMG Abd obese, large upper mid abdomen hernia w opened wound 1x 4 cm, no drainage 1+ LE edema, no UE edema R thigh AVG +bruit Neuro is alert, appropriate, nf  ECHo 2011 > LVEF 60%, LVH, normal RV function/ size CXR clear  HD: TTS [facility] 4h 10min 450/800 130.5kg 2/2.0 Bath Profile 4 Heparin 4000 L thigh AVG Mircera 225 mcg last 2/23, Venofer 100/ tues, Calcitriol 0.25 ug tiw po   Assessment: 1. Acute on chronic hypotension - off pressors. Chronic hypotension, no sig vol excess on exam or CXR, some mild dependent edema. Given severe hypotension on max midodrine in absence of gross vol overload, have d/w primary MD we agree that will raise dry wt and not try to pull any more fluid.    2. Anemia Hb low 8.3, on max Mircera and IV Fe, continue Fe, next esa due 1st of March 3. ESRD on HD, due for HD today.  4. Tuberous sclerosis 5. Seizure d/o - on medication 6. Polypharmacy 7. MR/ schizophrenia/ anxiety / depression - on multiple meds  Plan - short HD today to keep on schedule. Raising dry wt to 139.5kg.     Kelly Splinter MD  pager [phone]    cell [phone]  09/11/2014, 12:15 PM     Recent Labs Lab 09/08/14 1920 09/09/14 0337 09/10/14 0240  NA 135 136 132*  K 3.9 3.9 4.7  CL 97 97 92*  CO2 25 26 27   GLUCOSE 85 115* 137*  BUN 36* 39* 53*  CREATININE 5.45* 5.91* 7.58*  CALCIUM 8.8 8.8 8.0*  PHOS  --   --  5.2*    Recent Labs Lab 09/08/14 1920 09/09/14 0337 09/10/14 0240  AST 13 13 13   ALT 8 9 8   ALKPHOS 251* 230* 252*  BILITOT 1.0 1.0 0.8   PROT 6.4 5.8* 6.4  ALBUMIN 3.2* 2.9* 3.0*    Recent Labs Lab 09/08/14 1217 09/08/14 2310 09/09/14 0421 09/10/14 0240  WBC 5.1 4.2 4.6 4.5  NEUTROABS 3.6 2.7  --  4.0  HGB 8.7* 8.8* 7.6* 8.3*  HCT 28.2* 28.9* 25.3* 26.9*  MCV 106.4* 108.2* 107.7* 104.3*  PLT 152 146* 143* 147*   . albumin human      . ALPRAZolam  0.25 mg Oral Q T,Th,Sa-HD  . antiseptic oral rinse  7 mL Mouth Rinse q12n4p  . budesonide-formoterol  2 puff Inhalation BID  . calcitRIOL  0.25 mcg Oral Q T,Th,Sa-HD  . calcium acetate  1,334 mg Oral TID WC  . chlorhexidine  15 mL Mouth Rinse BID  . Chlorhexidine Gluconate Cloth  6 each Topical Q0600  . cycloSPORINE  1 drop Both Eyes BID  . [START ON 09/14/2014] darbepoetin (ARANESP) injection - DIALYSIS  200 mcg Intravenous Q Tue-HD  . docusate sodium  100 mg Oral BID  . DULoxetine  30 mg Oral BID  . [START ON 09/14/2014] ferric gluconate (FERRLECIT/NULECIT) IV  125 mg Intravenous  Q Tue-HD  . fludrocortisone  0.2 mg Oral Daily  . fluticasone  2 spray Each Nare Daily  . heparin  4,000 Units Dialysis Once in dialysis  . heparin  5,000 Units Subcutaneous 3 times per day  . hydrocortisone  25 mg Rectal BID  . hydrocortisone sodium succinate  50 mg Intravenous Q6H  . lamoTRIgine  125 mg Oral BID  . levETIRAcetam  1,000 mg Oral BID  . levothyroxine  50 mcg Oral QAC breakfast  . midodrine  10 mg Oral TID WC  . multivitamin  1 tablet Oral QHS  . mupirocin ointment  1 application Nasal BID  . pantoprazole  40 mg Oral Daily  . polyethylene glycol  17 g Oral BID  . QUEtiapine  25 mg Oral BID  . risperiDONE  0.5 mg Oral QHS  . senna-docusate  1 tablet Oral BID  . sertraline  100 mg Oral QHS  . sevelamer carbonate  800 mg Oral TID WC  . sodium chloride  3 mL Intravenous Q12H  . topiramate  200 mg Oral BID   . sodium chloride Stopped (09/10/14 1000)  . norepinephrine (LEVOPHED) Adult infusion Stopped (09/10/14 0100)   sodium chloride, sodium chloride, sodium chloride,  acetaminophen **OR** acetaminophen, feeding supplement (NEPRO CARB STEADY), heparin, HYDROcodone-acetaminophen, ipratropium-albuterol, lidocaine (PF), lidocaine-prilocaine, ondansetron **OR** ondansetron (ZOFRAN) IV, pentafluoroprop-tetrafluoroeth, traMADol, zolpidem

## 2014-09-11 NOTE — Progress Notes (Signed)
DR Sherral Hammers aware of Patients BP SBP runs high 60's to 80's. On midodrine po. Asymptomatic . Alert/oriented. Ate 100 % breakfast

## 2014-09-11 NOTE — Progress Notes (Signed)
[place] TEAM 1 - Stepdown/ICU TEAM Progress Note  CLYDE UPSHAW ZOX:[id number] DOB: 1955-10-22 DOA: 09/08/2014 PCP: Alvester Morin, MD  Admit HPI / Brief Narrative: Katrenia Alkins is a 59 y.o. WF PMHx depression, mental retardation, end-stage renal disease (hemodialysis Tuesday/Thursday/Saturday), seizures, obstructive sleep apnea on CPAP, peptic ulcer disease, HTN, S/P patient placement at [facility] 2013 October. Who presents to the emergency department today after being sent from her neurologist's office for hypertension. The patient reports she has had frequent falls over the past year. Most recently she has been lightheaded and dizzy particularly in the morning. She states that she frequently has petit mal seizures which go unnoticed by those around her. She complains of migraine headaches, and bright red blood in her stools secondary to hemorrhoids. She states that she has started making urine again and is now urinating frequently.  In the ER, patient's blood pressure was 58/20. It improved to 127/108 with a 1 L bolus. The patient also had a lactic acid of 3.77.   HPI/Subjective: 2/27 A/O 4, states lives in [place] home in [place]. States continues to feel light headed. Negative CP, negative SOB. States her pacer was last checked April 2015.    Assessment/Plan: Lower GI bleed/hemorrhoids/acute blood loss anemia (unsure of baseline hemoglobin last checked 07/31/11= 10.4). Per GI no further workup (known hemorrhoids)  -2/26 transfuse 2 units PRBC well on HD; Addendum blood was not transfused at hemodialysis.  - 2/26 Will transfuse 1 unit PRBC on floor. -Continue Anusol suppositories BID  Syncope and collapse -Most likely multifactorial to include GI bleed, ESRD, adrenal insufficiency?, Iatrogenic (multiple pain/psychotropic medication), pacer HR set low and Infection. -See lower GI bleed -Patient already on steroids therefore unable to determine if adrenal  insufficiency. -Continue Florinef to 0.2 mg daily -Continue Midodrine 10 TID  -Echocardiogram see results below -TED hose -Free T4 low/TSH low normal -Spoke with Dr.Robert D Schertz (nephrology) concerning patient's poor BP, and he agreed that we should not pull any fluid and reset her base weight at higher level.  Hypothyroidism -Start levothyroxine 50 g daily  Hypotension. -Not responsive to fluids and positive lactic acid of 3.77.  -Unfortunately per nephrology patient is significantly above her base weight, and is ESRD on HD therefore wIll need to watch fluid load carefully.  -DC qhs Xanax; Ambien 5 mg prn  -Patient does not meet criteria for sepsis, do not believe secondary to infection will DC antibiotics.  -Orthostatics ordered daily.  -Have requested physical therapy evaluation and orthostatics if patient can tolerate. -Spoke with Dr. Rayann Heman ([facility]) concerning settings on pacer. Asked if increasing setting on patient would help with hypotension, Stated that her pacer is set at 60 which is the usual pacer setting, therefore does not believe this issue..   Bradycardia (symptomatic) -See hypotension   Seizures -Continue Lamictal 50 mg QHS; obtain levels in the a.m. -Continue Keppra 1000 mg S/M/W/F; obtain levels in the a.m. -Continue Keppra 1500 mg T/TH/Sat  ESRD on HD Tuesday/Thursday/Saturday -Dialysis per nephrology -Patient reporting increased urine output,   Elevated alkaline phosphatase level -Other LFTs are within normal limits. -Requested alkaline phosphatase isoenzymes pending. -Abdominal ultrasound; R/O cholelithiasis    Code Status: FULL Family Communication: no family present at time of exam Disposition Plan: Stabilization of syncope, GI bleed    Consultants: Dr.William Outlaw [facility]) Dr.Robert D Schertz (nephrology) Dr. Rayann Heman ([facility]) phone consult   Procedure/Significant Events: 2/24 CT head without contrast;  nondiagnostic 2/26 transfuse 1 unit PRBC 2/26 echocardiogram;-  Left ventricle: mildly dilated. mild LVH. Systolic -KZSW=10% to 93%.   Culture    Antibiotics: Zosyn 2/24>> stopped 2/25 Vancomycin M/W/F>> stopped 2/25  DVT prophylaxis: Heparin   Devices    LINES / TUBES:      Continuous Infusions: . sodium chloride Stopped (09/10/14 1000)  . norepinephrine (LEVOPHED) Adult infusion Stopped (09/10/14 0100)    Objective: VITAL SIGNS: Temp: 98.1 F (36.7 C) (02/27 0830) Temp Source: Oral (02/27 0830) BP: 86/43 mmHg (02/27 0830) Pulse Rate: 60 (02/27 0830) SPO2; FIO2:   Intake/Output Summary (Last 24 hours) at 09/11/14 2355 Last data filed at 09/11/14 7322  Gross per 24 hour  Intake    848 ml  Output   2675 ml  Net  -1827 ml     Exam: General: A/O 4, NAD, No acute respiratory distress Lungs: Clear to auscultation bilaterally without wheezes or crackles Cardiovascular: Regular rate and rhythm without murmur gallop or rub normal S1 and S2 Abdomen: Morbidly obese, Nontender, nondistended, soft, bowel sounds positive, no rebound, no ascites, no appreciable mass Extremities: No significant cyanosis, clubbing, or edema bilateral lower extremities  Data Reviewed: Basic Metabolic Panel:  Recent Labs Lab 09/08/14 1217 09/08/14 1920 09/09/14 0337 09/10/14 0240  NA 136 135 136 132*  K 3.7 3.9 3.9 4.7  CL 96 97 97 92*  CO2 28 25 26 27   GLUCOSE 97 85 115* 137*  BUN 34* 36* 39* 53*  CREATININE 5.20* 5.45* 5.91* 7.58*  CALCIUM 9.1 8.8 8.8 8.0*  MG  --   --   --  2.0  PHOS  --   --   --  5.2*   Liver Function Tests:  Recent Labs Lab 09/08/14 1217 09/08/14 1920 09/09/14 0337 09/10/14 0240  AST 19 13 13 13   ALT 10 8 9 8   ALKPHOS 277* 251* 230* 252*  BILITOT 1.0 1.0 1.0 0.8  PROT 6.9 6.4 5.8* 6.4  ALBUMIN 3.5 3.2* 2.9* 3.0*   No results for input(s): LIPASE, AMYLASE in the last 168 hours. No results for input(s): AMMONIA in the last 168  hours. CBC:  Recent Labs Lab 09/08/14 1217 09/08/14 2310 09/09/14 0421 09/10/14 0240  WBC 5.1 4.2 4.6 4.5  NEUTROABS 3.6 2.7  --  4.0  HGB 8.7* 8.8* 7.6* 8.3*  HCT 28.2* 28.9* 25.3* 26.9*  MCV 106.4* 108.2* 107.7* 104.3*  PLT 152 146* 143* 147*   Cardiac Enzymes:  Recent Labs Lab 09/08/14 1217  TROPONINI 0.04*   BNP (last 3 results) No results for input(s): BNP in the last 8760 hours.  ProBNP (last 3 results) No results for input(s): PROBNP in the last 8760 hours.  CBG:  Recent Labs Lab 09/09/14 0313 09/09/14 1107 09/09/14 1600 09/09/14 2000  GLUCAP 110* 174* 152* 132*    Recent Results (from the past 240 hour(s))  Culture, blood (routine x 2)     Status: None (Preliminary result)   Collection Time: 09/08/14  6:00 PM  Result Value Ref Range Status   Specimen Description BLOOD HAND LEFT  Final   Special Requests BOTTLES DRAWN AEROBIC AND ANAEROBIC 5CC  Final   Culture   Final           BLOOD CULTURE RECEIVED NO GROWTH TO DATE CULTURE WILL BE HELD FOR 5 DAYS BEFORE ISSUING A FINAL NEGATIVE REPORT Performed at [facility]    Report Status PENDING  Incomplete  Culture, blood (routine x 2)     Status: None (Preliminary result)   Collection Time: 09/08/14  6:15 PM  Result Value Ref Range Status   Specimen Description BLOOD ARM LEFT  Final   Special Requests BOTTLES DRAWN AEROBIC ONLY 5CC  Final   Culture   Final           BLOOD CULTURE RECEIVED NO GROWTH TO DATE CULTURE WILL BE HELD FOR 5 DAYS BEFORE ISSUING A FINAL NEGATIVE REPORT Performed at [facility]    Report Status PENDING  Incomplete  MRSA PCR Screening     Status: Abnormal   Collection Time: 09/08/14  8:32 PM  Result Value Ref Range Status   MRSA by PCR POSITIVE (A) NEGATIVE Final    Comment:        The GeneXpert MRSA Assay (FDA approved for NASAL specimens only), is one component of a comprehensive MRSA colonization surveillance program. It is not intended to diagnose  MRSA infection nor to guide or monitor treatment for MRSA infections. RESULT CALLED TO, READ BACK BY AND VERIFIED WITH: ENNIS,M RN [phone] AT 2309 SKEEN,P      Studies:  Recent x-ray studies have been reviewed in detail by the Attending Physician  Scheduled Meds:  Scheduled Meds: . ALPRAZolam  0.25 mg Oral Q T,Th,Sa-HD  . antiseptic oral rinse  7 mL Mouth Rinse q12n4p  . budesonide-formoterol  2 puff Inhalation BID  . calcitRIOL  0.25 mcg Oral Q T,Th,Sa-HD  . calcium acetate  1,334 mg Oral TID WC  . chlorhexidine  15 mL Mouth Rinse BID  . Chlorhexidine Gluconate Cloth  6 each Topical Q0600  . cycloSPORINE  1 drop Both Eyes BID  . [START ON 09/14/2014] darbepoetin (ARANESP) injection - DIALYSIS  200 mcg Intravenous Q Tue-HD  . docusate sodium  100 mg Oral BID  . DULoxetine  30 mg Oral BID  . [START ON 09/14/2014] ferric gluconate (FERRLECIT/NULECIT) IV  125 mg Intravenous Q Tue-HD  . fludrocortisone  0.2 mg Oral Daily  . fluticasone  2 spray Each Nare Daily  . heparin  4,000 Units Dialysis Once in dialysis  . heparin  5,000 Units Subcutaneous 3 times per day  . hydrocortisone  25 mg Rectal BID  . hydrocortisone sodium succinate  50 mg Intravenous Q6H  . lamoTRIgine  125 mg Oral BID  . levETIRAcetam  1,000 mg Oral BID  . levothyroxine  50 mcg Oral QAC breakfast  . midodrine  10 mg Oral TID WC  . multivitamin  1 tablet Oral QHS  . mupirocin ointment  1 application Nasal BID  . pantoprazole  40 mg Oral Daily  . polyethylene glycol  17 g Oral BID  . QUEtiapine  25 mg Oral BID  . risperiDONE  0.5 mg Oral QHS  . senna-docusate  1 tablet Oral BID  . sertraline  100 mg Oral QHS  . sevelamer carbonate  800 mg Oral TID WC  . sodium chloride  3 mL Intravenous Q12H  . topiramate  200 mg Oral BID    Time spent on care of this patient: 40 mins   Allie Bossier [facility]  [facility] Office  [phone] Pager - [phone]  On-Call/Text Page:      [email or web address]       password [email or web address]  If 7PM-7AM, please contact night-coverage [email or web address] Password [facility] 09/11/2014, 9:39 AM   LOS: 3 days   Care during the described time interval was provided by me .  I have reviewed this patient's available data, including medical history, events of note, physical examination, radiology studies and test results  as part of my evaluation  Dia Crawford, MD [phone] Pager

## 2014-09-11 NOTE — Progress Notes (Signed)
PT Cancellation Note  Patient Details Name: [name] MRN: [id number] DOB: [birth year]   Cancelled Treatment:    Reason Eval/Treat Not Completed: Patient at procedure or test/unavailable. At HD, will follow for evaluation.   Duncan Dull 09/11/2014, 11:58 AM Alben Deeds, PT DPT  [phone]

## 2014-09-12 ENCOUNTER — Inpatient Hospital Stay (HOSPITAL_COMMUNITY): Payer: Medicare Other

## 2014-09-12 DIAGNOSIS — R296 Repeated falls: Secondary | ICD-10-CM

## 2014-09-12 DIAGNOSIS — K922 Gastrointestinal hemorrhage, unspecified: Secondary | ICD-10-CM | POA: Diagnosis present

## 2014-09-12 LAB — CBC WITH DIFFERENTIAL/PLATELET
BASOS ABS: 0 10*3/uL (ref 0.0–0.1)
Basophils Relative: 0 % (ref 0–1)
EOS ABS: 0 10*3/uL (ref 0.0–0.7)
Eosinophils Relative: 1 % (ref 0–5)
HEMATOCRIT: 30 % — AB (ref 36.0–46.0)
HEMOGLOBIN: 9.2 g/dL — AB (ref 12.0–15.0)
Lymphocytes Relative: 16 % (ref 12–46)
Lymphs Abs: 0.7 10*3/uL (ref 0.7–4.0)
MCH: 32.9 pg (ref 26.0–34.0)
MCHC: 30.7 g/dL (ref 30.0–36.0)
MCV: 107.1 fL — AB (ref 78.0–100.0)
MONOS PCT: 4 % (ref 3–12)
Monocytes Absolute: 0.2 10*3/uL (ref 0.1–1.0)
Neutro Abs: 3.4 10*3/uL (ref 1.7–7.7)
Neutrophils Relative %: 79 % — ABNORMAL HIGH (ref 43–77)
Platelets: 136 10*3/uL — ABNORMAL LOW (ref 150–400)
RBC: 2.8 MIL/uL — ABNORMAL LOW (ref 3.87–5.11)
RDW: 18.8 % — ABNORMAL HIGH (ref 11.5–15.5)
WBC: 4.3 10*3/uL (ref 4.0–10.5)

## 2014-09-12 LAB — COMPREHENSIVE METABOLIC PANEL
ALT: 9 U/L (ref 0–35)
AST: 12 U/L (ref 0–37)
Albumin: 3.3 g/dL — ABNORMAL LOW (ref 3.5–5.2)
Alkaline Phosphatase: 214 U/L — ABNORMAL HIGH (ref 39–117)
Anion gap: 8 (ref 5–15)
BUN: 31 mg/dL — AB (ref 6–23)
CHLORIDE: 97 mmol/L (ref 96–112)
CO2: 31 mmol/L (ref 19–32)
Calcium: 8.9 mg/dL (ref 8.4–10.5)
Creatinine, Ser: 5.27 mg/dL — ABNORMAL HIGH (ref 0.50–1.10)
GFR calc Af Amer: 10 mL/min — ABNORMAL LOW (ref >90)
GFR calc non Af Amer: 8 mL/min — ABNORMAL LOW (ref >90)
GLUCOSE: 123 mg/dL — AB (ref 70–99)
Potassium: 4 mmol/L (ref 3.5–5.1)
SODIUM: 136 mmol/L (ref 135–145)
Total Bilirubin: 0.6 mg/dL (ref 0.3–1.2)
Total Protein: 6.2 g/dL (ref 6.0–8.3)

## 2014-09-12 LAB — MAGNESIUM: Magnesium: 2.1 mg/dL (ref 1.5–2.5)

## 2014-09-12 NOTE — Progress Notes (Signed)
[facility] Progress Note   Subjective: no complaints, off pressors, BP 80's  Filed Vitals:   09/12/14 0345 09/12/14 0500 09/12/14 0741 09/12/14 0955  BP: 107/69   89/53  Pulse: 60   60  Temp: 97.9 F (36.6 C)   97.5 F (36.4 C)  TempSrc: Oral   Axillary  Resp: 20   21  Height:      Weight:  138.9 kg (306 lb 3.5 oz)    SpO2: 98%  94% 100%   Exam: Alert, no distress, calm No jvd Chest clear bilat RRR no RMG Abd obese, large upper mid abdomen hernia w opened wound 1x 4 cm, no drainage 1+ LE edema, no UE edema R thigh AVG +bruit Neuro is alert, appropriate, nf  ECHo 2011 > LVEF 60%, LVH, normal RV function/ size CXR clear  HD: TTS [facility] 4h 33min 450/800 130.5kg 2/2.0 Bath Profile 4 Heparin 4000 L thigh AVG Mircera 225 mcg last 2/23, Venofer 100/ tues, Calcitriol 0.25 ug tiw po   Assessment: 1. Acute on chronic hypotension - have decided to raise dry wt significantly as patient not showing any signs of volume excess at this higher dry wt and BP 's are better. New dry wt will be 138kg. Have d/w pt who agrees to the change.  2. Anemia Hb low 8.3, on max Mircera and IV Fe, continue Fe, next esa due 1st of March 3. 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 Seizure d/o - on medication 6. Polypharmacy 7. MR/ schizophrenia/ anxiety / depression - on multiple meds  Plan - no further adjustments planned from renal standpoint, can be dc'd per primary when ready     Kelly Splinter MD  pager [phone]    cell [phone]  09/12/2014, 11:14 AM     Recent Labs Lab 09/10/14 0240 09/11/14 1247 09/12/14 0500  NA 132* 136 136  K 4.7 4.2 4.0  CL 92* 99 97  CO2 27 29 31   GLUCOSE 137* 110* 123*  BUN 53* 33* 31*  CREATININE 7.58* 5.64* 5.27*  CALCIUM 8.0* 8.3* 8.9  PHOS 5.2*  --   --     Recent Labs Lab 09/09/14 0337 09/10/14 0240 09/12/14 0500  AST 13 13 12   ALT 9 8 9   ALKPHOS 230* 252* 214*  BILITOT 1.0 0.8 0.6  PROT 5.8* 6.4 6.2  ALBUMIN  2.9* 3.0* 3.3*    Recent Labs Lab 09/08/14 2310  09/10/14 0240 09/11/14 1247 09/12/14 0500  WBC 4.2  < > 4.5 4.8 4.3  NEUTROABS 2.7  --  4.0  --  3.4  HGB 8.8*  < > 8.3* 9.0* 9.2*  HCT 28.9*  < > 26.9* 29.2* 30.0*  MCV 108.2*  < > 104.3* 102.8* 107.1*  PLT 146*  < > 147* 150 136*  < > = values in this interval not displayed. Marland Kitchen ALPRAZolam  0.25 mg Oral Q T,Th,Sa-HD  . antiseptic oral rinse  7 mL Mouth Rinse q12n4p  . budesonide-formoterol  2 puff Inhalation BID  . calcitRIOL  0.25 mcg Oral Q T,Th,Sa-HD  . calcium acetate  1,334 mg Oral TID WC  . chlorhexidine  15 mL Mouth Rinse BID  . Chlorhexidine Gluconate Cloth  6 each Topical Q0600  . cycloSPORINE  1 drop Both Eyes BID  . [START ON 09/14/2014] darbepoetin (ARANESP) injection - DIALYSIS  200 mcg Intravenous Q Tue-HD  . docusate sodium  100 mg Oral BID  . [START ON 09/14/2014] ferric gluconate (FERRLECIT/NULECIT) IV  125 mg Intravenous Q Tue-HD  . fludrocortisone  0.2 mg Oral Daily  . fluticasone  2 spray Each Nare Daily  . gabapentin  100 mg Oral BID  . heparin  5,000 Units Subcutaneous 3 times per day  . hydrocortisone  25 mg Rectal BID  . hydrocortisone sodium succinate  50 mg Intravenous Q6H  . lamoTRIgine  50 mg Oral QHS  . levETIRAcetam  1,000 mg Oral Once per day on Sun Mon Wed Fri   And  . [START ON 09/13/2014] levETIRAcetam  1,500 mg Oral Once per day on Tue Thu Sat  . levothyroxine  50 mcg Oral QAC breakfast  . midodrine  10 mg Oral TID WC  . multivitamin  1 tablet Oral QHS  . mupirocin ointment  1 application Nasal BID  . pantoprazole  40 mg Oral Daily  . polyethylene glycol  17 g Oral BID  . risperiDONE  0.5 mg Oral QHS  . senna-docusate  1 tablet Oral BID  . sertraline  150 mg Oral QHS  . sevelamer carbonate  800 mg Oral TID WC  . sodium chloride  3 mL Intravenous Q12H  . topiramate  200 mg Oral BID   . sodium chloride Stopped (09/10/14 1000)  . norepinephrine (LEVOPHED) Adult infusion Stopped (09/10/14  0100)   acetaminophen **OR** acetaminophen, HYDROcodone-acetaminophen, ipratropium-albuterol, ondansetron **OR** ondansetron (ZOFRAN) IV, traMADol, zolpidem

## 2014-09-12 NOTE — Progress Notes (Signed)
PT Cancellation Note  Patient Details Name: [name] MRN: [id number] DOB: 09-30-1955   Cancelled Treatment:    Reason Eval/Treat Not Completed: Medical issues which prohibited therapy.  Patient continues to have temporary Lt femoral line.  Will return tomorrow for PT as appropriate.   Despina Pole 09/12/2014, 1:21 PM Carita Pian Sanjuana Kava, [place] Pager [phone]

## 2014-09-12 NOTE — Progress Notes (Signed)
[place] TEAM 1 - Stepdown/ICU TEAM Progress Note  KYNDALL AMERO WIO:[id number] DOB: Nov 08, 1955 DOA: 09/08/2014 PCP: Alvester Morin, MD  Admit HPI / Brief Narrative: Bryahna Lesko is a 59 y.o. WF PMHx depression, mental retardation, end-stage renal disease (hemodialysis Tuesday/Thursday/Saturday), seizures, obstructive sleep apnea on CPAP, peptic ulcer disease, HTN, S/P patient placement at [facility] 2013 October. Who presents to the emergency department today after being sent from her neurologist's office for hypertension. The patient reports she has had frequent falls over the past year. Most recently she has been lightheaded and dizzy particularly in the morning. She states that she frequently has petit mal seizures which go unnoticed by those around her. She complains of migraine headaches, and bright red blood in her stools secondary to hemorrhoids. She states that she has started making urine again and is now urinating frequently.  In the ER, patient's blood pressure was 58/20. It improved to 127/108 with a 1 L bolus. The patient also had a lactic acid of 3.77.   HPI/Subjective: 2/28 A/O 4, states lives in [place] in [place]. States continues to feel light headed but improved. [facility] started her on Eliquis to prophylactically prevent her from having DVT, states NEVER HAD DVT. Negative CP, negative SOB. States her pacer was last checked April 2015.    Assessment/Plan: Lower GI bleed/hemorrhoids/acute blood loss anemia (unsure of baseline hemoglobin last checked 07/31/11= 10.4). Per GI no further workup (known hemorrhoids)  -2/26 transfuse 2 units PRBC well on HD; Addendum blood was not transfused at hemodialysis.  - 2/26 Will transfuse 1 unit PRBC on floor. -Continue Anusol suppositories BID  Syncope and collapse -Most likely multifactorial to include GI bleed, ESRD, adrenal insufficiency?, Iatrogenic (multiple  pain/psychotropic medication), pacer HR set low and Infection. -See lower GI bleed -Patient already on steroids therefore unable to determine if adrenal insufficiency. -Continue Florinef to 0.2 mg daily -Continue Midodrine 10 TID  -Echocardiogram see results below -TED hose -Free T4 low/TSH low normal -Spoke with Dr.Robert D Schertz (nephrology) concerning patient's poor BP, and he agreed that we should not pull any fluid and reset her base weight at higher level. New dry weight 138 kg per nephrology  Hypothyroidism -Continue  levothyroxine 50 g daily -PCP to check TSH in 6-8 weeks  Hypotension. -Not responsive to fluids and positive lactic acid of 3.77.  -See syncope and collapse.  -DC qhs Xanax; Ambien 5 mg prn  -Orthostatics ordered daily.  -Spoke with Dr. Rayann Heman ([facility]) concerning settings on pacer. Asked if increasing setting on patient would help with hypotension, Stated that her pacer is set at 60 which is the usual pacer setting, therefore does not believe this issue..  -Negative orthostatic hypotension; patient's BP slightly improved (probably at its most optimal level currently)  Bradycardia (symptomatic) -See hypotension   Seizures -Continue Lamictal 50 mg QHS; obtain levels in the a.m. -Continue Keppra 1000 mg S/M/W/F; obtain levels in the a.m. -Continue Keppra 1500 mg T/TH/Sat  ESRD on HD Tuesday/Thursday/Saturday -Dialysis per nephrology   Elevated alkaline phosphatase level -Other LFTs are within normal limits. -Requested alkaline phosphatase isoenzymes pending. -Abdominal ultrasound; nondiagnostic for cause -Obtain acute hepatitis panel    Code Status: FULL Family Communication: no family present at time of exam Disposition Plan: Stabilization of syncope, GI bleed    Consultants: Dr.William Outlaw [facility]) Dr.Robert D Schertz (nephrology) Dr. Rayann Heman ([facility]) phone consult   Procedure/Significant Events: 2/24 CT head without  contrast; nondiagnostic 2/26 transfuse 1 unit  PRBC 2/26 echocardiogram;- Left ventricle: mildly dilated. mild LVH. Systolic -QION=62% to 95%. 2/28 Ultrasound abdomen right upper quadrant;-Status post cholecystectomy.- Fatty liver   Culture    Antibiotics: Zosyn 2/24>> stopped 2/25 Vancomycin M/W/F>> stopped 2/25  DVT prophylaxis: Heparin   Devices    LINES / TUBES:      Continuous Infusions: . sodium chloride Stopped (09/10/14 1000)  . norepinephrine (LEVOPHED) Adult infusion Stopped (09/10/14 0100)    Objective: VITAL SIGNS: Temp: 97.5 F (36.4 C) (02/28 1246) Temp Source: Axillary (02/28 1246) BP: 96/58 mmHg (02/28 1711) Pulse Rate: 65 (02/28 1711) SPO2; FIO2:   Intake/Output Summary (Last 24 hours) at 09/12/14 1929 Last data filed at 09/11/14 2000  Gross per 24 hour  Intake     33 ml  Output      0 ml  Net     33 ml     Exam: General: A/O 4, NAD, still having some lightheadedness, No acute respiratory distress Lungs: Clear to auscultation bilaterally without wheezes or crackles Cardiovascular: Regular rate and rhythm without murmur gallop or rub normal S1 and S2 Abdomen: Morbidly obese, Nontender, nondistended, soft, bowel sounds positive, no rebound, no ascites, no appreciable mass Extremities: No significant cyanosis, clubbing, or edema bilateral lower extremities  Data Reviewed: Basic Metabolic Panel:  Recent Labs Lab 09/08/14 1920 09/09/14 0337 09/10/14 0240 09/11/14 1247 09/12/14 0500  NA 135 136 132* 136 136  K 3.9 3.9 4.7 4.2 4.0  CL 97 97 92* 99 97  CO2 25 26 27 29 31   GLUCOSE 85 115* 137* 110* 123*  BUN 36* 39* 53* 33* 31*  CREATININE 5.45* 5.91* 7.58* 5.64* 5.27*  CALCIUM 8.8 8.8 8.0* 8.3* 8.9  MG  --   --  2.0 2.1 2.1  PHOS  --   --  5.2*  --   --    Liver Function Tests:  Recent Labs Lab 09/08/14 1217 09/08/14 1920 09/09/14 0337 09/10/14 0240 09/12/14 0500  AST 19 13 13 13 12   ALT 10 8 9 8 9   ALKPHOS 277* 251*  230* 252* 214*  BILITOT 1.0 1.0 1.0 0.8 0.6  PROT 6.9 6.4 5.8* 6.4 6.2  ALBUMIN 3.5 3.2* 2.9* 3.0* 3.3*   No results for input(s): LIPASE, AMYLASE in the last 168 hours. No results for input(s): AMMONIA in the last 168 hours. CBC:  Recent Labs Lab 09/08/14 1217 09/08/14 2310 09/09/14 0421 09/10/14 0240 09/11/14 1247 09/12/14 0500  WBC 5.1 4.2 4.6 4.5 4.8 4.3  NEUTROABS 3.6 2.7  --  4.0  --  3.4  HGB 8.7* 8.8* 7.6* 8.3* 9.0* 9.2*  HCT 28.2* 28.9* 25.3* 26.9* 29.2* 30.0*  MCV 106.4* 108.2* 107.7* 104.3* 102.8* 107.1*  PLT 152 146* 143* 147* 150 136*   Cardiac Enzymes:  Recent Labs Lab 09/08/14 1217  TROPONINI 0.04*   BNP (last 3 results) No results for input(s): BNP in the last 8760 hours.  ProBNP (last 3 results) No results for input(s): PROBNP in the last 8760 hours.  CBG:  Recent Labs Lab 09/09/14 0313 09/09/14 1107 09/09/14 1600 09/09/14 2000  GLUCAP 110* 174* 152* 132*    Recent Results (from the past 240 hour(s))  Culture, blood (routine x 2)     Status: None (Preliminary result)   Collection Time: 09/08/14  6:00 PM  Result Value Ref Range Status   Specimen Description BLOOD HAND LEFT  Final   Special Requests BOTTLES DRAWN AEROBIC AND ANAEROBIC 5CC  Final   Culture   Final  BLOOD CULTURE RECEIVED NO GROWTH TO DATE CULTURE WILL BE HELD FOR 5 DAYS BEFORE ISSUING A FINAL NEGATIVE REPORT Performed at [facility]    Report Status PENDING  Incomplete  Culture, blood (routine x 2)     Status: None (Preliminary result)   Collection Time: 09/08/14  6:15 PM  Result Value Ref Range Status   Specimen Description BLOOD ARM LEFT  Final   Special Requests BOTTLES DRAWN AEROBIC ONLY 5CC  Final   Culture   Final           BLOOD CULTURE RECEIVED NO GROWTH TO DATE CULTURE WILL BE HELD FOR 5 DAYS BEFORE ISSUING A FINAL NEGATIVE REPORT Performed at [facility]    Report Status PENDING  Incomplete  MRSA PCR Screening     Status: Abnormal     Collection Time: 09/08/14  8:32 PM  Result Value Ref Range Status   MRSA by PCR POSITIVE (A) NEGATIVE Final    Comment:        The GeneXpert MRSA Assay (FDA approved for NASAL specimens only), is one component of a comprehensive MRSA colonization surveillance program. It is not intended to diagnose MRSA infection nor to guide or monitor treatment for MRSA infections. RESULT CALLED TO, READ BACK BY AND VERIFIED WITH: ENNIS,M RN [phone] AT 2309 SKEEN,P   Clostridium Difficile by PCR     Status: None   Collection Time: 09/11/14 12:57 AM  Result Value Ref Range Status   C difficile by pcr NEGATIVE NEGATIVE Final     Studies:  Recent x-ray studies have been reviewed in detail by the Attending Physician  Scheduled Meds:  Scheduled Meds: . ALPRAZolam  0.25 mg Oral Q T,Th,Sa-HD  . antiseptic oral rinse  7 mL Mouth Rinse q12n4p  . budesonide-formoterol  2 puff Inhalation BID  . calcitRIOL  0.25 mcg Oral Q T,Th,Sa-HD  . calcium acetate  1,334 mg Oral TID WC  . chlorhexidine  15 mL Mouth Rinse BID  . Chlorhexidine Gluconate Cloth  6 each Topical Q0600  . cycloSPORINE  1 drop Both Eyes BID  . [START ON 09/14/2014] darbepoetin (ARANESP) injection - DIALYSIS  200 mcg Intravenous Q Tue-HD  . docusate sodium  100 mg Oral BID  . [START ON 09/14/2014] ferric gluconate (FERRLECIT/NULECIT) IV  125 mg Intravenous Q Tue-HD  . fludrocortisone  0.2 mg Oral Daily  . fluticasone  2 spray Each Nare Daily  . gabapentin  100 mg Oral BID  . heparin  5,000 Units Subcutaneous 3 times per day  . hydrocortisone  25 mg Rectal BID  . hydrocortisone sodium succinate  50 mg Intravenous Q6H  . lamoTRIgine  50 mg Oral QHS  . levETIRAcetam  1,000 mg Oral Once per day on Sun Mon Wed Fri   And  . [START ON 09/13/2014] levETIRAcetam  1,500 mg Oral Once per day on Tue Thu Sat  . levothyroxine  50 mcg Oral QAC breakfast  . midodrine  10 mg Oral TID WC  . multivitamin  1 tablet Oral QHS  . mupirocin ointment  1  application Nasal BID  . pantoprazole  40 mg Oral Daily  . polyethylene glycol  17 g Oral BID  . risperiDONE  0.5 mg Oral QHS  . senna-docusate  1 tablet Oral BID  . sertraline  150 mg Oral QHS  . sevelamer carbonate  800 mg Oral TID WC  . sodium chloride  3 mL Intravenous Q12H  . topiramate  200 mg Oral  BID    Time spent on care of this patient: 40 mins   Allie Bossier [facility]  [facility] Office  [phone] Pager - [phone]  On-Call/Text Page:      [email or web address]      password [email or web address]  If 7PM-7AM, please contact night-coverage [email or web address] Password [facility] 09/12/2014, 7:29 PM   LOS: 4 days   Care during the described time interval was provided by me .  I have reviewed this patient's available data, including medical history, events of note, physical examination, radiology studies and test results as part of my evaluation  Dia Crawford, MD [phone] Pager

## 2014-09-13 LAB — COMPREHENSIVE METABOLIC PANEL
ALK PHOS: 209 U/L — AB (ref 39–117)
ALT: 12 U/L (ref 0–35)
AST: 17 U/L (ref 0–37)
Albumin: 3.2 g/dL — ABNORMAL LOW (ref 3.5–5.2)
Anion gap: 10 (ref 5–15)
BILIRUBIN TOTAL: 0.5 mg/dL (ref 0.3–1.2)
BUN: 52 mg/dL — AB (ref 6–23)
CHLORIDE: 94 mmol/L — AB (ref 96–112)
CO2: 28 mmol/L (ref 19–32)
Calcium: 8.7 mg/dL (ref 8.4–10.5)
Creatinine, Ser: 7.01 mg/dL — ABNORMAL HIGH (ref 0.50–1.10)
GFR calc Af Amer: 7 mL/min — ABNORMAL LOW (ref >90)
GFR calc non Af Amer: 6 mL/min — ABNORMAL LOW (ref >90)
Glucose, Bld: 111 mg/dL — ABNORMAL HIGH (ref 70–99)
POTASSIUM: 4.7 mmol/L (ref 3.5–5.1)
SODIUM: 132 mmol/L — AB (ref 135–145)
TOTAL PROTEIN: 6 g/dL (ref 6.0–8.3)

## 2014-09-13 LAB — TYPE AND SCREEN
ABO/RH(D): A POS
ANTIBODY SCREEN: POSITIVE
DAT, IgG: NEGATIVE
DONOR AG TYPE: NEGATIVE
Donor AG Type: NEGATIVE
Unit division: 0
Unit division: 0

## 2014-09-13 LAB — CBC WITH DIFFERENTIAL/PLATELET
Basophils Absolute: 0 10*3/uL (ref 0.0–0.1)
Basophils Relative: 0 % (ref 0–1)
Eosinophils Absolute: 0 10*3/uL (ref 0.0–0.7)
Eosinophils Relative: 0 % (ref 0–5)
HCT: 28.9 % — ABNORMAL LOW (ref 36.0–46.0)
HEMOGLOBIN: 9.2 g/dL — AB (ref 12.0–15.0)
LYMPHS ABS: 0.9 10*3/uL (ref 0.7–4.0)
Lymphocytes Relative: 20 % (ref 12–46)
MCH: 33.1 pg (ref 26.0–34.0)
MCHC: 31.8 g/dL (ref 30.0–36.0)
MCV: 104 fL — ABNORMAL HIGH (ref 78.0–100.0)
MONOS PCT: 5 % (ref 3–12)
Monocytes Absolute: 0.2 10*3/uL (ref 0.1–1.0)
NEUTROS ABS: 3.4 10*3/uL (ref 1.7–7.7)
NEUTROS PCT: 75 % (ref 43–77)
Platelets: 149 10*3/uL — ABNORMAL LOW (ref 150–400)
RBC: 2.78 MIL/uL — AB (ref 3.87–5.11)
RDW: 18.1 % — ABNORMAL HIGH (ref 11.5–15.5)
WBC: 4.5 10*3/uL (ref 4.0–10.5)

## 2014-09-13 LAB — LEVETIRACETAM LEVEL: Levetiracetam Lvl: 31.6 ug/mL (ref 10.0–40.0)

## 2014-09-13 LAB — HEPATITIS PANEL, ACUTE
HCV Ab: NEGATIVE
Hep A IgM: NONREACTIVE
Hep B C IgM: NONREACTIVE
Hepatitis B Surface Ag: NEGATIVE

## 2014-09-13 LAB — MAGNESIUM: MAGNESIUM: 2.1 mg/dL (ref 1.5–2.5)

## 2014-09-13 MED ORDER — PENTAFLUOROPROP-TETRAFLUOROETH EX AERO: 1.0000 "application " | INHALATION_SPRAY | CUTANEOUS | Status: DC | PRN

## 2014-09-13 MED ORDER — LIDOCAINE HCL (PF) 1 % IJ SOLN: 5.0000 mL | INTRAMUSCULAR | Status: DC | PRN

## 2014-09-13 MED ORDER — HEPARIN SODIUM (PORCINE) 1000 UNIT/ML DIALYSIS
100.0000 [IU] | INTRAMUSCULAR | Status: DC | PRN
  Filled 2014-09-13: qty 1

## 2014-09-13 MED ORDER — SODIUM CHLORIDE 0.9 % IV SOLN: 100.0000 mL | INTRAVENOUS | Status: DC | PRN

## 2014-09-13 MED ORDER — HEPARIN SODIUM (PORCINE) 1000 UNIT/ML DIALYSIS
1000.0000 [IU] | INTRAMUSCULAR | Status: DC | PRN
  Filled 2014-09-13: qty 1

## 2014-09-13 MED ORDER — ALTEPLASE 2 MG IJ SOLR
2.0000 mg | Freq: Once | INTRAMUSCULAR | Status: AC | PRN
  Filled 2014-09-13: qty 2

## 2014-09-13 MED ORDER — NEPRO/CARBSTEADY PO LIQD: 237.0000 mL | ORAL | Status: DC | PRN

## 2014-09-13 MED ORDER — HYDROCORTISONE NA SUCCINATE PF 100 MG IJ SOLR
50.0000 mg | Freq: Two times a day (BID) | INTRAMUSCULAR | Status: DC
  Administered 2014-09-14 (×2): 50 mg via INTRAVENOUS
  Filled 2014-09-13 (×3): qty 1

## 2014-09-13 MED ORDER — LIDOCAINE-PRILOCAINE 2.5-2.5 % EX CREA: 1.0000 "application " | TOPICAL_CREAM | CUTANEOUS | Status: DC | PRN

## 2014-09-13 NOTE — Clinical Social Work Placement (Addendum)
Clinical Social Work Department CLINICAL SOCIAL WORK PLACEMENT NOTE 09/13/2014  Patient:  [name], [name]  Account Number:  [id number] Admit date:  09/08/2014  Clinical Social Worker:  Glendon Axe, CLINICAL SOCIAL WORKER  Date/time:  09/10/2014 08:37 AM  Clinical Social Work is seeking post-discharge placement for this patient at the following level of care:   [place]   (*CSW will update this form in Epic as items are completed)   09/10/2014  Patient/family provided with [place] Department of Clinical Social Work's list of facilities offering this level of care within the geographic area requested by the patient (or if unable, by the patient's family).  09/10/2014  Patient/family informed of their freedom to choose among providers that offer the needed level of care, that participate in Medicare, Medicaid or managed care program needed by the patient, have an available bed and are willing to accept the patient.  09/10/2014  Patient/family informed of [facility]' ownership interest in [facility], as well as of the fact that they are under no obligation to receive care at this facility.  PASARR submitted to EDS on 09/10/2014 PASARR number received on 09/10/2014  FL2 transmitted to all facilities in geographic area requested by pt/family on  09/10/2014 FL2 transmitted to all facilities within larger geographic area on   Patient informed that his/her managed care company has contracts with or will negotiate with  certain facilities, including the following:     Patient/family informed of bed offers received: 09/14/14  Patient chooses to return to [facility] in [place]  Physician recommends and patient chooses bed at    Patient to be transferred to [facility] on 09/14/14   Patient to be transferred to facility by ambulance Patient and family notified of transfer on 09/14/14 Name of family member notified: [name], wife of patient's father  [name]. Mr. Carby not available per wife. She will inform him of patient's discharge.     The following physician request were entered in Epic:  Additional Comments: 09/14/14: Patient given bed offer (one) from SNF search and informed that she is ready for discharge today. Ms. Benegas advised that she could talk with staff at [facility] on her desire to move closer to family.        Domenica Reamer, [place] Social Worker [phone]

## 2014-09-13 NOTE — Evaluation (Addendum)
Occupational Therapy Evaluation Patient Details Name: [name] MRN: [id number] DOB: [birth year] Today's Date: 09/13/2014    History of Present Illness This 59 y.o. female admitted from neurologist's office for hypotension.  PMH includes:  Depression, Mental retardation, ESRD (hemodialysis); seizures, Obstructive sleep apnea on CPAP; PUD; HTN; pacemaker    Clinical Impression   Pt admitted with above.  She presents to OT with generalized weakness, impaired balance.   Currently, she requires min - mod A with BADLs.  Feel she would benefit from SNF at discharge.  All further OT needs can be deferred to SNF. Acute OT will discharge at this time.  BP supine 73/48 Sitting 84/62 Standing 90/52 Seated at end of session 73/44    Follow Up Recommendations  SNF    Equipment Recommendations  None recommended by OT    Recommendations for Other Services       Precautions / Restrictions Precautions Precautions: Fall      Mobility Bed Mobility Overal bed mobility: Needs Assistance Bed Mobility: Supine to Sit     Supine to sit: Supervision;HOB elevated        Transfers Overall transfer level: Needs assistance   Transfers: Sit to/from Stand;Stand Pivot Transfers Sit to Stand: Supervision Stand pivot transfers: Min guard       General transfer comment: min guard for balance and  walker safety     Balance Overall balance assessment: Needs assistance Sitting-balance support: Feet supported Sitting balance-Leahy Scale: Good     Standing balance support: During functional activity Standing balance-Leahy Scale: Fair                              ADL Overall ADL's : Needs assistance/impaired Eating/Feeding: Independent;Sitting   Grooming: Wash/dry hands;Wash/dry face;Oral care;Brushing hair;Set up;Sitting   Upper Body Bathing: Set up;Sitting   Lower Body Bathing: Moderate assistance;Sit to/from stand   Upper Body Dressing : Minimal  assistance;Sitting   Lower Body Dressing: Moderate assistance;Sit to/from stand Lower Body Dressing Details (indicate cue type and reason): Pt unable to don socks - she reports it is due to hospital bed being higher than the bed at SNF  Toilet Transfer: Min guard;Stand-pivot;BSC   Toileting- Clothing Manipulation and Hygiene: Minimal assistance;Sit to/from stand       Functional mobility during ADLs: Engineer, technical sales     Praxis      Pertinent Vitals/Pain Pain Assessment: No/denies pain     Hand Dominance Right   Extremity/Trunk Assessment Upper Extremity Assessment Upper Extremity Assessment: Generalized weakness   Lower Extremity Assessment Lower Extremity Assessment: Defer to PT evaluation   Cervical / Trunk Assessment Cervical / Trunk Assessment: Normal   Communication Communication Communication: No difficulties   Cognition Arousal/Alertness: Awake/alert Behavior During Therapy: WFL for tasks assessed/performed Overall Cognitive Status: History of cognitive impairments - at baseline                     General Comments       Exercises       Shoulder Instructions      Home Living Family/patient expects to be discharged to:: Skilled nursing facility                                        Prior Functioning/Environment Level of  Independence: Needs assistance  Gait / Transfers Assistance Needed: Pt reports she ambulates short distances pushing wheelchair, but reports h/o falls due to lightheadedness.  When she is fatigued.  ADL's / Homemaking Assistance Needed: Pt reports she is able to perform BADLs except washing her back with set up assist         OT Diagnosis: Generalized weakness;Cognitive deficits   OT Problem List: Decreased strength;Decreased activity tolerance;Impaired balance (sitting and/or standing);Obesity   OT Treatment/Interventions: Self-care/ADL training;DME and/or AE  instruction;Therapeutic activities;Patient/family education;Balance training    OT Goals(Current goals can be found in the care plan section) Acute Rehab OT Goals Patient Stated Goal: to walk better  OT Goal Formulation: All assessment and education complete, DC therapy  OT Frequency: Min 2X/week   Barriers to D/C: Decreased caregiver support          Co-evaluation              End of Session Equipment Utilized During Treatment: Rolling walker Nurse Communication: Mobility status  Activity Tolerance: Patient tolerated treatment well Patient left: in chair;with call bell/phone within reach   Time: 1338-1401 OT Time Calculation (min): 23 min Charges:  OT General Charges $OT Visit: 1 Procedure OT Evaluation $Initial OT Evaluation Tier I: 1 Procedure OT Treatments $Self Care/Home Management : 8-22 mins G-Codes:    Klaire Court M Sep 16, 2014, 2:24 PM

## 2014-09-13 NOTE — Progress Notes (Signed)
Admission note:   Arrival Method: [facility]red [facility]/c at 1630. Mental Status: A&OX4. Telemetry: N/A  Skin: RUQ abdominal hernia with "open scar tissue" per patient. Wound is not approximated. Foam dressing clean, dry, intact. Aquacel ordered per new orders.  Tubes: N/A IV: LUA PIV flushes well. Pain: Denies.  Family: None at bedside. Living Situation: From SNF. Safety Measures: Call bell within reach. 6E Orientation: Oriented to unit and surroundings.  Joellen Jersey, RN.

## 2014-09-13 NOTE — Progress Notes (Signed)
Subjective: Interval History: has complaints ;has not been up.  Objective: Vital signs in last 24 hours: Temp:  [97.5 F (36.4 C)-97.8 F (36.6 C)] 97.6 F (36.4 C) (02/29 0400) Pulse Rate:  [60-66] 60 (02/28 2342) Resp:  [14-21] 14 (02/28 2342) BP: (79-96)/(37-64) 79/38 mmHg (02/29 0400) SpO2:  [94 %-100 %] 97 % (02/28 2342) Weight:  [144.6 kg (318 lb 12.6 oz)] 144.6 kg (318 lb 12.6 oz) (02/29 0500) Weight change: 3.4 kg (7 lb 7.9 oz)  Intake/Output from previous day: 02/28 0701 - 02/29 0700 In: 240 [P.O.:240] Out: -  Intake/Output this shift:    General appearance: alert, cooperative and morbidly obese Resp: diminished breath sounds bilaterally Cardio: S1, S2 normal, systolic murmur: holosystolic 2/6, blowing at lower left sternal border and paced, 60 GI: obese, wound RUQ, many scars, liver down 6 cm Extremities: edema 1+, AVG R groin and many scars Skin: Tuberi on face  Lab Results:  Recent Labs  09/12/14 0500 09/13/14 0548  WBC 4.3 4.5  HGB 9.2* 9.2*  HCT 30.0* 28.9*  PLT 136* 149*   BMET:  Recent Labs  09/12/14 0500 09/13/14 0548  NA 136 132*  K 4.0 4.7  CL 97 94*  CO2 31 28  GLUCOSE 123* 111*  BUN 31* 52*  CREATININE 5.27* 7.01*  CALCIUM 8.9 8.7   No results for input(s): PTH in the last 72 hours. Iron Studies: No results for input(s): IRON, TIBC, TRANSFERRIN, FERRITIN in the last 72 hours.  Studies/Results: US Abdomen Limited Ruq  09/12/2014   CLINICAL DATA:  Elevated alkaline phosphatase. Status post cholecystectomy.  EXAM: US ABDOMEN LIMITED - RIGHT UPPER QUADRANT  COMPARISON:  None.  FINDINGS: Gallbladder:  Gallbladder surgically absent.  Common bile duct:  Diameter: 4.6 mm  Liver:  The liver is echogenic. There is attenuation of the ultrasound wave, poor visualization of the internal hepatic architecture, and loss of definition of the diaphragm. No focal hepatic abnormality identified.  Study quality is degraded by patient body habitus.   IMPRESSION: 1. Status post cholecystectomy. 2. Fatty liver.   Electronically Signed   By: Nolon Nations M.D.   On: 09/12/2014 10:23    I have reviewed the patient's current medications.  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 Vol ok 2 Tuberous Sclerosis 3 Sz secondary to #2 4 hypotension stable, No role for flourinf with no Renal function 5 Obesity 6 Anemia max epo/Fe 7 falls 8 MR P HD, sz meds, D/C flourinef, cont epo/Fe    LOS: 5 days   Kilani Joffe L 09/13/2014,7:20 AM

## 2014-09-13 NOTE — Evaluation (Signed)
Physical Therapy Evaluation Patient Details Name: [name] MRN: [id number] DOB: 1956-04-15 Today's Date: 09/13/2014   History of Present Illness  This 59 y.o. female admitted from neurologist's office for hypotension.  PMH includes:  Depression, Mental retardation, ESRD (hemodialysis); seizures, Obstructive sleep apnea on CPAP; PUD; HTN; pacemaker   Clinical Impression  Patient demonstrates deficits in functional mobility as indicated below. Will need continued skilled PT to address deficits and maximize function. Will see as indicated and progress as tolerated.  Patient highly motivated to improve functional levels. At this time, feel patient will benefit significantly from continued skilled PT at SNF.     Follow Up Recommendations SNF;Supervision/Assistance - 24 hour    Equipment Recommendations  None recommended by PT    Recommendations for Other Services       Precautions / Restrictions Precautions Precautions: Fall      Mobility  Bed Mobility               General bed mobility comments: received in chair post OT session  Transfers Overall transfer level: Needs assistance Equipment used: Rolling walker (2 wheeled) Transfers: Sit to/from [facility] Sit to Stand: Supervision Stand pivot transfers: Min guard       General transfer comment: min guard for balance and  walker safety, VCs for hand placement and positioning   Ambulation/Gait Ambulation/Gait assistance: Min guard Ambulation Distance (Feet): 70 Feet (broken into 3 trails of ambulation 25',25', 20') Assistive device: Rolling walker (2 wheeled) Gait Pattern/deviations: Step-to pattern;Decreased stride length;Trunk flexed;Wide base of support Gait velocity: decreased Gait velocity interpretation: Below normal speed for age/gender General Gait Details: increased lateral sway, increased fatigue with ambulation, tolerated 25' intervals with 3 seated rest breaks. Cues for safety and  positioning  Stairs            Wheelchair Mobility    Modified Rankin (Stroke Patients Only)       Balance Overall balance assessment: Needs assistance Sitting-balance support: Feet supported Sitting balance-Leahy Scale: Good     Standing balance support: During functional activity Standing balance-Leahy Scale: Fair                               Pertinent Vitals/Pain Pain Assessment: No/denies pain    Home Living Family/patient expects to be discharged to:: Skilled nursing facility                      Prior Function Level of Independence: Needs assistance   Gait / Transfers Assistance Needed: Pt reports she ambulates short distances pushing wheelchair, but reports h/o falls due to lightheadedness.  When she is fatigued.   ADL's / Homemaking Assistance Needed: Pt reports she is able to perform BADLs except washing her back with set up assist         Hand Dominance   Dominant Hand: Right    Extremity/Trunk Assessment   Upper Extremity Assessment: Generalized weakness           Lower Extremity Assessment: Generalized weakness (increased LE edema)      Cervical / Trunk Assessment: Normal  Communication   Communication: No difficulties  Cognition Arousal/Alertness: Awake/alert Behavior During Therapy: WFL for tasks assessed/performed Overall Cognitive Status: History of cognitive impairments - at baseline                      General Comments General comments (skin integrity, edema, etc.): BP supine  73/48; sitting 84/62; standing 90/52; seated at end of session 73/44    Exercises        Assessment/Plan    PT Assessment Patient needs continued PT services  PT Diagnosis Difficulty walking;Generalized weakness   PT Problem List Decreased strength;Decreased range of motion;Decreased activity tolerance;Decreased balance;Decreased mobility;Decreased knowledge of use of DME;Cardiopulmonary status limiting activity  PT  Treatment Interventions DME instruction;Gait training;Functional mobility training;Therapeutic activities;Therapeutic exercise;Balance training;Patient/family education   PT Goals (Current goals can be found in the Care Plan section) Acute Rehab PT Goals Patient Stated Goal: to walk better  PT Goal Formulation: With patient Time For Goal Achievement: 09/27/14 Potential to Achieve Goals: Good    Frequency Min 3X/week   Barriers to discharge        Co-evaluation               End of Session Equipment Utilized During Treatment: Gait belt Activity Tolerance: Patient tolerated treatment well;Patient limited by fatigue Patient left: in chair;with call bell/phone within reach Nurse Communication: Mobility status         Time: 3532-9924 PT Time Calculation (min) (ACUTE ONLY): 22 min   Charges:   PT Evaluation $Initial PT Evaluation Tier I: 1 Procedure     PT G CodesDuncan Dull 2014-10-12, 3:24 PM Alben Deeds, [place] DPT  [phone]

## 2014-09-13 NOTE — Progress Notes (Signed)
TEAM 1 - Stepdown/ICU TEAM Progress Note  [name] WVP:[id number] DOB: 08/07/1955 DOA: 09/08/2014 PCP: Alvester Morin, MD  Admit HPI / Brief Narrative: 59 y.o. F w/ Hx depression, mental handicap, end-stage renal disease (hemodialysis Tuesday/Thursday/Saturday), seizures, obstructive sleep apnea on CPAP, peptic ulcer disease, and HTN, who presented to the emergency department after being sent from her Neurologist's office for hypotension. The patient reported she has had frequent falls over the past year. Most recently she had been lightheaded and dizzy particularly in the morning. She stated that she frequently has petit mal seizures which go unnoticed by those around her. She complained of migraine headaches, and bright red blood in her stools secondary to hemorrhoids. She stated that she started making urine again and is now urinating frequently.  In the ER, patient's blood pressure was 58/20. It improved to 127/108 with a 1 L bolus. The patient also had a lactic acid of 3.77.  HPI/Subjective: Today the pt c/o "feeling bad because my BP is low."  When asked how she feels she says "very weak and dizzy."  She denies cp, sob, n/v, or abdom pain.    Assessment/Plan:  Hypotension -Not responsive to fluids 7.  -DC'ed qhs Xanax -Dr. Sherral Hammers spoke with Dr. Rayann Heman (EP) concerning settings on pacer. Asked if increasing setting on patient would help with hypotension, Stated that her pacer is set at 60 which is the usual pacer setting, therefore does not believe this issue. -NOT orthostatic hypotension -she appears to tolerate SBP in the 70s-80s quite well - PT/OT eval today - if not symptomatic w/ ambulation will accept this as her new baseline and follow   Syncope and collapse -multifactorial to include GI bleed, ESRD, adrenal insufficiency?, multiple pain/psychotropic medications -Patient already on steroids therefore unable to determine if adrenal  insufficiency -Continue Midodrine 10 TID  -TED hose -TSH at goal of 1.0 -Spoke with Dr.Robert D Schertz concerning patient's poor BP, and he agreed that we should not pull any fluid and reset her base weight at higher level - new dry weight 138 kg per Nephrology  Lower GI bleed / hemorrhoids / acute blood loss anemia -unsure of baseline hemoglobin - 07/31/11= 10.4 - per GI no further workup w/ known hemorrhoids -2/26 transfused 1 unit PRBC  -Continue Anusol suppositories BID -Hgb currently holding steady at ~9  Macrocytosis -check N46 and folic acid  Hypothyroidism -Continue  levothyroxine 50 g daily -PCP to check TSH in 6-8 weeks - current TSH right at goal   Tuberous Sclerosis  Seizures -Continue usual tx regimen w/o change   ESRD on HD Tuesday/Thursday/Saturday -Dialysis per Nephrology - dry weight being adjusted   Elevated alkaline phosphatase level -Other LFTs are within normal limits -Requested alkaline phosphatase isoenzymes pending. -Abdominal ultrasound nondiagnostic for cause -Obtain acute hepatitis panel  Obesity - Body mass index is 49.92 kg/(m^2).  Code Status: FULL Family Communication: no family present at time of exam Disposition Plan: transfer to renal floor - for HD 3/1 - if tolerates despite low BP will plan for d/c after HD to SNF   Consultants: Dr.William Outlaw [facility]) Dr.Robert D Schertz (Nephrology)  Procedure/Significant Events: 2/24 CT head without contrast; nondiagnostic 2/26 transfuse 1 unit PRBC 2/26 echocardiogram;- Left ventricle: mildly dilated. mild LVH. Systolic -EVOJ=50% to 09%. 2/28 Ultrasound abdomen right upper quadrant;-Status post cholecystectomy.- Fatty liver  Antibiotics: Zosyn 2/24 > 2/25 Vancomycin 2/24  DVT prophylaxis: SQ Heparin  Objective: Blood pressure 63/40, pulse 59, temperature 97.9 F (36.6 C),  temperature source Oral, resp. rate 143, height 5\' 7"  (1.702 m), weight 144.6 kg (318 lb 12.6 oz), SpO2 97  %.  Intake/Output Summary (Last 24 hours) at 09/13/14 1152 Last data filed at 09/13/14 0800  Gross per 24 hour  Intake    480 ml  Output      0 ml  Net    480 ml    Exam: General: No acute respiratory distress Lungs: Clear to auscultation bilaterally without wheezes or crackles Cardiovascular: Regular rate and rhythm without murmur gallop or rub normal S1 and S2 Abdomen: Morbidly obese, nontender, soft, bowel sounds positive, no rebound, no ascites, no appreciable mass - wound at apex of abdom wound dressed and dry - dressing removed and noted no purulent d/c or surrounding erythema Extremities: No significant cyanosis, clubbing, or edema bilateral lower extremities  Data Reviewed: Basic Metabolic Panel:  Recent Labs Lab 09/09/14 0337 09/10/14 0240 09/11/14 1247 09/12/14 0500 09/13/14 0548  NA 136 132* 136 136 132*  K 3.9 4.7 4.2 4.0 4.7  CL 97 92* 99 97 94*  CO2 26 27 29 31 28   GLUCOSE 115* 137* 110* 123* 111*  BUN 39* 53* 33* 31* 52*  CREATININE 5.91* 7.58* 5.64* 5.27* 7.01*  CALCIUM 8.8 8.0* 8.3* 8.9 8.7  MG  --  2.0 2.1 2.1 2.1  PHOS  --  5.2*  --   --   --    Liver Function Tests:  Recent Labs Lab 09/08/14 1920 09/09/14 0337 09/10/14 0240 09/12/14 0500 09/13/14 0548  AST 13 13 13 12 17   ALT 8 9 8 9 12   ALKPHOS 251* 230* 252* 214* 209*  BILITOT 1.0 1.0 0.8 0.6 0.5  PROT 6.4 5.8* 6.4 6.2 6.0  ALBUMIN 3.2* 2.9* 3.0* 3.3* 3.2*   CBC:  Recent Labs Lab 09/08/14 1217 09/08/14 2310 09/09/14 0421 09/10/14 0240 09/11/14 1247 09/12/14 0500 09/13/14 0548  WBC 5.1 4.2 4.6 4.5 4.8 4.3 4.5  NEUTROABS 3.6 2.7  --  4.0  --  3.4 3.4  HGB 8.7* 8.8* 7.6* 8.3* 9.0* 9.2* 9.2*  HCT 28.2* 28.9* 25.3* 26.9* 29.2* 30.0* 28.9*  MCV 106.4* 108.2* 107.7* 104.3* 102.8* 107.1* 104.0*  PLT 152 146* 143* 147* 150 136* 149*   Cardiac Enzymes:  Recent Labs Lab 09/08/14 1217  TROPONINI 0.04*   CBG:  Recent Labs Lab 09/09/14 0313 09/09/14 1107 09/09/14 1600  09/09/14 2000  GLUCAP 110* 174* 152* 132*    Recent Results (from the past 240 hour(s))  Culture, blood (routine x 2)     Status: None (Preliminary result)   Collection Time: 09/08/14  6:00 PM  Result Value Ref Range Status   Specimen Description BLOOD HAND LEFT  Final   Special Requests BOTTLES DRAWN AEROBIC AND ANAEROBIC 5CC  Final   Culture   Final           BLOOD CULTURE RECEIVED NO GROWTH TO DATE CULTURE WILL BE HELD FOR 5 DAYS BEFORE ISSUING A FINAL NEGATIVE REPORT Performed at [facility]    Report Status PENDING  Incomplete  Culture, blood (routine x 2)     Status: None (Preliminary result)   Collection Time: 09/08/14  6:15 PM  Result Value Ref Range Status   Specimen Description BLOOD ARM LEFT  Final   Special Requests BOTTLES DRAWN AEROBIC ONLY 5CC  Final   Culture   Final           BLOOD CULTURE RECEIVED NO GROWTH TO DATE CULTURE WILL BE  HELD FOR 5 DAYS BEFORE ISSUING A FINAL NEGATIVE REPORT Performed at [facility]    Report Status PENDING  Incomplete  MRSA PCR Screening     Status: Abnormal   Collection Time: 09/08/14  8:32 PM  Result Value Ref Range Status   MRSA by PCR POSITIVE (A) NEGATIVE Final    Comment:        The GeneXpert MRSA Assay (FDA approved for NASAL specimens only), is one component of a comprehensive MRSA colonization surveillance program. It is not intended to diagnose MRSA infection nor to guide or monitor treatment for MRSA infections. RESULT CALLED TO, READ BACK BY AND VERIFIED WITH: ENNIS,M RN [phone] AT 2309 SKEEN,P   Clostridium Difficile by PCR     Status: None   Collection Time: 09/11/14 12:57 AM  Result Value Ref Range Status   C difficile by pcr NEGATIVE NEGATIVE Final     Studies:  Recent x-ray studies have been reviewed in detail by the Attending Physician  Scheduled Meds:  Scheduled Meds: . ALPRAZolam  0.25 mg Oral Q T,Th,Sa-HD  . antiseptic oral rinse  7 mL Mouth Rinse q12n4p  .  budesonide-formoterol  2 puff Inhalation BID  . calcitRIOL  0.25 mcg Oral Q T,Th,Sa-HD  . calcium acetate  1,334 mg Oral TID WC  . chlorhexidine  15 mL Mouth Rinse BID  . cycloSPORINE  1 drop Both Eyes BID  . [START ON 09/14/2014] darbepoetin (ARANESP) injection - DIALYSIS  200 mcg Intravenous Q Tue-HD  . docusate sodium  100 mg Oral BID  . [START ON 09/14/2014] ferric gluconate (FERRLECIT/NULECIT) IV  125 mg Intravenous Q Tue-HD  . fluticasone  2 spray Each Nare Daily  . gabapentin  100 mg Oral BID  . heparin  5,000 Units Subcutaneous 3 times per day  . hydrocortisone  25 mg Rectal BID  . hydrocortisone sodium succinate  50 mg Intravenous Q6H  . lamoTRIgine  50 mg Oral QHS  . levETIRAcetam  1,000 mg Oral Once per day on Sun Mon Wed Fri   And  . levETIRAcetam  1,500 mg Oral Once per day on Tue Thu Sat  . levothyroxine  50 mcg Oral QAC breakfast  . midodrine  10 mg Oral TID WC  . multivitamin  1 tablet Oral QHS  . mupirocin ointment  1 application Nasal BID  . pantoprazole  40 mg Oral Daily  . polyethylene glycol  17 g Oral BID  . risperiDONE  0.5 mg Oral QHS  . senna-docusate  1 tablet Oral BID  . sertraline  150 mg Oral QHS  . sevelamer carbonate  800 mg Oral TID WC  . sodium chloride  3 mL Intravenous Q12H  . topiramate  200 mg Oral BID    Time spent on care of this patient: 35 mins  Cherene Altes, MD [facility] For Consults/Admissions - Flow Manager - [phone] Office  [phone]  Contact MD directly via text page:      [email or web address]      password [facility]  09/13/2014, 11:52 AM   LOS: 5 days

## 2014-09-13 NOTE — Progress Notes (Signed)
PT Cancellation Note  Patient Details Name: [name] MRN: [id number] DOB: 07/02/1956   Cancelled Treatment:     Spoke with patient, will come back for PT evaluation and ambulation after lunch today 09/13/14 at patient's request.     Duncan Dull 09/13/2014, 11:55 AM Alben Deeds, PT DPT  [phone]

## 2014-09-14 DIAGNOSIS — R001 Bradycardia, unspecified: Secondary | ICD-10-CM | POA: Diagnosis present

## 2014-09-14 DIAGNOSIS — R748 Abnormal levels of other serum enzymes: Secondary | ICD-10-CM | POA: Diagnosis present

## 2014-09-14 DIAGNOSIS — K922 Gastrointestinal hemorrhage, unspecified: Secondary | ICD-10-CM | POA: Diagnosis present

## 2014-09-14 LAB — CBC
HEMATOCRIT: 29.4 % — AB (ref 36.0–46.0)
HEMOGLOBIN: 9.3 g/dL — AB (ref 12.0–15.0)
MCH: 32.9 pg (ref 26.0–34.0)
MCHC: 31.6 g/dL (ref 30.0–36.0)
MCV: 103.9 fL — ABNORMAL HIGH (ref 78.0–100.0)
PLATELETS: 140 10*3/uL — AB (ref 150–400)
RBC: 2.83 MIL/uL — ABNORMAL LOW (ref 3.87–5.11)
RDW: 18.3 % — AB (ref 11.5–15.5)
WBC: 5.4 10*3/uL (ref 4.0–10.5)

## 2014-09-14 LAB — PHOSPHORUS: PHOSPHORUS: 5.2 mg/dL — AB (ref 2.3–4.6)

## 2014-09-14 LAB — ALKALINE PHOSPHATASE ISOENZYMES
Alkaline phosphatase (APISO): 302 U/L — ABNORMAL HIGH (ref 33–130)
Bone %: 85 % — ABNORMAL HIGH (ref 28–66)
Intestinal %: 1 % (ref 1–24)
Liver %: 14 % — ABNORMAL LOW (ref 25–69)
MACROHEPATIC ISOENZYMES: 0 %

## 2014-09-14 LAB — COMPREHENSIVE METABOLIC PANEL
ALK PHOS: 219 U/L — AB (ref 39–117)
ALT: 19 U/L (ref 0–35)
ANION GAP: 16 — AB (ref 5–15)
AST: 24 U/L (ref 0–37)
Albumin: 3.5 g/dL (ref 3.5–5.2)
BILIRUBIN TOTAL: 0.8 mg/dL (ref 0.3–1.2)
BUN: 69 mg/dL — AB (ref 6–23)
CHLORIDE: 93 mmol/L — AB (ref 96–112)
CO2: 21 mmol/L (ref 19–32)
CREATININE: 8.65 mg/dL — AB (ref 0.50–1.10)
Calcium: 8.5 mg/dL (ref 8.4–10.5)
GFR, EST AFRICAN AMERICAN: 5 mL/min — AB (ref >90)
GFR, EST NON AFRICAN AMERICAN: 5 mL/min — AB (ref >90)
GLUCOSE: 127 mg/dL — AB (ref 70–99)
POTASSIUM: 4.5 mmol/L (ref 3.5–5.1)
Sodium: 130 mmol/L — ABNORMAL LOW (ref 135–145)
Total Protein: 6.2 g/dL (ref 6.0–8.3)

## 2014-09-14 LAB — LAMOTRIGINE LEVEL: Lamotrigine Lvl: 1.9 ug/mL — ABNORMAL LOW (ref 2.0–20.0)

## 2014-09-14 LAB — FOLATE: Folate: 16.8 ng/mL

## 2014-09-14 LAB — VITAMIN B12: VITAMIN B 12: 551 pg/mL (ref 211–911)

## 2014-09-14 MED ORDER — MORPHINE SULFATE 2 MG/ML IJ SOLN
INTRAMUSCULAR | Status: AC
  Filled 2014-09-14: qty 1

## 2014-09-14 MED ORDER — MIDODRINE HCL 10 MG PO TABS: 20.0000 mg | ORAL_TABLET | Freq: Three times a day (TID) | ORAL | Status: AC

## 2014-09-14 MED ORDER — DARBEPOETIN ALFA 200 MCG/0.4ML IJ SOSY
200.0000 ug | PREFILLED_SYRINGE | INTRAMUSCULAR | Status: AC
Start: 2014-09-14 — End: ?

## 2014-09-14 MED ORDER — CALCITRIOL 0.25 MCG PO CAPS: 0.2500 ug | ORAL_CAPSULE | ORAL | Status: AC

## 2014-09-14 MED ORDER — HYDROCORTISONE ACETATE 25 MG RE SUPP: 25.0000 mg | Freq: Two times a day (BID) | RECTAL | Status: AC

## 2014-09-14 MED ORDER — BISACODYL 5 MG PO TBEC: 10.0000 mg | DELAYED_RELEASE_TABLET | Freq: Every day | ORAL | Status: AC | PRN

## 2014-09-14 MED ORDER — MIDODRINE HCL 5 MG PO TABS
20.0000 mg | ORAL_TABLET | Freq: Three times a day (TID) | ORAL | Status: DC
  Administered 2014-09-14 (×2): 20 mg via ORAL
  Filled 2014-09-14 (×3): qty 4

## 2014-09-14 MED ORDER — SODIUM CHLORIDE 0.9 % IV SOLN: 125.0000 mg | INTRAVENOUS | Status: AC

## 2014-09-14 MED ORDER — LEVOTHYROXINE SODIUM 50 MCG PO TABS: 50.0000 ug | ORAL_TABLET | Freq: Every day | ORAL | Status: AC

## 2014-09-14 MED ORDER — LEVETIRACETAM 1000 MG PO TABS: ORAL_TABLET | ORAL | Status: AC

## 2014-09-14 MED ORDER — LEVETIRACETAM 750 MG PO TABS: ORAL_TABLET | ORAL | Status: AC

## 2014-09-14 MED ORDER — PANTOPRAZOLE SODIUM 40 MG PO TBEC: 40.0000 mg | DELAYED_RELEASE_TABLET | Freq: Every day | ORAL | Status: DC

## 2014-09-14 MED ORDER — ZOLPIDEM TARTRATE 5 MG PO TABS: 5.0000 mg | ORAL_TABLET | Freq: Every evening | ORAL | Status: AC | PRN

## 2014-09-14 MED ORDER — PANTOPRAZOLE SODIUM 40 MG PO TBEC: 40.0000 mg | DELAYED_RELEASE_TABLET | Freq: Two times a day (BID) | ORAL | Status: AC

## 2014-09-14 MED ORDER — DARBEPOETIN ALFA 200 MCG/0.4ML IJ SOSY
PREFILLED_SYRINGE | INTRAMUSCULAR | Status: AC
  Administered 2014-09-14: 200 ug via INTRAVENOUS
  Filled 2014-09-14: qty 0.4

## 2014-09-14 NOTE — Progress Notes (Signed)
Patient Discharge:  Disposition: Pt discharged to [facility]  Education: [facility] and discharge summary sent to SNF. Report given to nurse at [facility].  IV: Removed  Telemetry: N/A  Transportation: [facility]  Belongings: All belongings taken with pt.

## 2014-09-14 NOTE — Discharge Summary (Addendum)
Physician Discharge Summary  JOIE HIPPS BZJ:[id number] DOB: 06/03/1956 DOA: 09/08/2014  PCP: Alvester Morin, MD  Admit date: 09/08/2014 Discharge date: 09/19/2014  Time spent: 60minutes  Recommendations for Outpatient Follow-up:  Lower GI bleed/hemorrhoids/acute blood loss anemia  - Per GI no further workup (known hemorrhoids); discharge hemoglobin 9.3 would use as baseline -2/26 transfuse 2 units PRBC well on HD; Addendum blood was not transfused at hemodialysis.  - 2/26 Will transfuse 1 unit PRBC on floor. -Continue Anusol suppositories BID -Follow-up with PCP if hemorrhoids do not totally resolve.  Syncope and collapse -Most likely multifactorial to include GI bleed, ESRD, adrenal insufficiency?, Iatrogenic (multiple pain/psychotropic medication), pacer HR set low and Infection. -See lower GI bleed -Continue Florinef to 0.2 mg daily -Continue Midodrine 10 TID  -Echocardiogram see results below -TED hose -Free T4 low/TSH low normal -Spoke with Dr.Robert D Schertz (nephrology) concerning patient's poor BP, and he agreed reset her base weight at higher level. New dry weight 138 kg  -Resume outpatient scheduled for HD  Hypothyroidism -Continue levothyroxine 50 g daily -PCP to check TSH in 6-8 weeks -Follow-up PCP  Hypotension. -See syncope and collapse.  -DC qhs Xanax; Ambien 5 mg prn  -Spoke with Dr. Rayann Heman ([facility]) concerning settings on pacer. Asked if increasing setting on patient would help with hypotension, Stated that her pacer is set at 60 which is the usual pacer setting, therefore does not believe this issue..  -Negative orthostatic hypotension; patient's BP slightly improved (probably at its most optimal level currently) -Follow-up with PCP  Bradycardia (symptomatic) -See hypotension   Seizures -Continue Lamictal 50 mg QHS; levels 1.9 which is slightly low however patient asymptomatic will allow her PCP to adjust or refer to neurologist  for adjustment.  -Continue Keppra 1000 mg S/M/W/F; levels within normal limit  -Continue Keppra 1500 mg T/TH/Sat -Follow-up PCP  ESRD on HD Tuesday/Thursday/Saturday -Dialysis per nephrology   Elevated alkaline phosphatase level -Other LFTs are within normal limits. -Alkaline phosphatase isoenzymes should be worked up as outpatient by PCP. -Abdominal ultrasound; nondiagnostic for cause -Acute hepatitis panel; negative -PCP to workup elevated I believe phosphatase level        Discharge Diagnoses:  Active Problems:   Bright red rectal bleeding   End stage renal disease on dialysis   Seizures   Frequent falls   Elevated alkaline phosphatase level   Arterial hypotension   Lower GI bleed   Hemorrhoid   Acute blood loss anemia   Syncope and collapse   Other specified hypotension   End-stage renal disease on hemodialysis   Other specified hypothyroidism   Symptomatic bradycardia   Bleeding gastrointestinal   Lower GI bleeding   Bradycardia   Abnormal serum level of alkaline phosphatase   Discharge Condition: Stable  Diet recommendation: Renal diet  Filed Weights   09/12/14 0500 09/13/14 0500 09/13/14 2317  Weight: 138.9 kg (306 lb 3.5 oz) 144.6 kg (318 lb 12.6 oz) 143.926 kg (317 lb 4.8 oz)    History of present illness:  Ajia Chadderdon is a 59 y.o. WF PMHx depression, mental retardation, end-stage renal disease (hemodialysis Tuesday/Thursday/Saturday), seizures, obstructive sleep apnea on CPAP, peptic ulcer disease, HTN, S/P patient placement at [facility] 2013 October. Who presents to the emergency department today after being sent from her neurologist's office for hypertension. The patient reports she has had frequent falls over the past year. Most recently she has been lightheaded and dizzy particularly in the morning. She states that she frequently has petit mal seizures which  go unnoticed by those around her. She complains of migraine headaches, and bright red  blood in her stools secondary to hemorrhoids. She states that she has started making urine again and is now urinating frequently.  In the ER, patient's blood pressure was 58/20. It improved to 127/108 with a 1 L bolus. The patient also had a lactic acid of 3.77. Patient's hospitalization she was worked up for syncope and collapse, which was found to be multifactorial to include need to reset end-diastolic weight upwards, hypothyroidism, and acute blood loss anemia. In addition a she was found to have elevated alkaline phosphatase levels which workup needs to be completed as an outpatient. States postdialysis does not feel as well as prior to dialysis. However significantly improved from admission.    Consultants: Dr.William Outlaw [facility]) Dr.Robert D Schertz (nephrology) Dr. Rayann Heman ([facility]) phone consult   Procedure/Significant Events: 2/24 CT head without contrast; nondiagnostic 2/26 transfuse 1 unit PRBC 2/26 echocardiogram;- Left ventricle: mildly dilated. mild LVH. Systolic -XIHW=38% to 88%. 2/28 Ultrasound abdomen right upper quadrant;-Status post cholecystectomy.- Fatty liver    Discharge Exam: Filed Vitals:   09/14/14 1200 09/14/14 1233 09/14/14 1300 09/14/14 1320  BP: 87/62 92/49 88/49  90/50  Pulse: 57 60 60 62  Temp:   97.5 F (36.4 C)   TempSrc:   Oral   Resp: 18 18 18 18   Height:      Weight:      SpO2:        General: A/O 4, NAD, negative lightheadedness, No acute respiratory distress Lungs: Clear to auscultation bilaterally without wheezes or crackles Cardiovascular: Regular rate and rhythm without murmur gallop or rub normal S1 and S2 Abdomen: Morbidly obese, Nontender, nondistended, soft, bowel sounds positive, no rebound, no ascites, no appreciable mass Extremities: No significant cyanosis, clubbing, or edema bilateral lower extremities     Discharge Instructions     Medication List    STOP taking these medications        doxycycline  100 MG capsule  Commonly known as:  VIBRAMYCIN     ELIQUIS 2.5 MG Tabs tablet  Generic drug:  apixaban     fludrocortisone 0.1 MG tablet  Commonly known as:  FLORINEF     guaifenesin 400 MG Tabs tablet  Commonly known as:  HUMIBID E     HYDROcodone-acetaminophen 5-325 MG per tablet  Commonly known as:  NORCO/VICODIN     hydrOXYzine 25 MG tablet  Commonly known as:  ATARAX/VISTARIL      TAKE these medications        acetaminophen 325 MG tablet  Commonly known as:  TYLENOL  Take 650 mg by mouth every 6 (six) hours as needed for mild pain.     ALPRAZolam 0.25 MG tablet  Commonly known as:  XANAX  Take 1 mg by mouth See admin instructions. Takes pre-dialysis on Tuesday, Thursday, and Saturday     antiseptic oral rinse Liqd  10 mLs by Mouth Rinse route as needed for dry mouth.     bisacodyl 5 MG EC tablet  Commonly known as:  DULCOLAX  Take 2 tablets (10 mg total) by mouth daily as needed for moderate constipation.     calcitRIOL 0.25 MCG capsule  Commonly known as:  ROCALTROL  Take 1 capsule (0.25 mcg total) by mouth Every Tuesday,Thursday,and Saturday with dialysis.     calcium acetate 667 MG capsule  Commonly known as:  PHOSLO  Take 2,001 mg by mouth 3 (three) times daily.  Darbepoetin Alfa 200 MCG/0.4ML Sosy injection  Commonly known as:  ARANESP  Inject 0.4 mLs (200 mcg total) into the vein every Tuesday with hemodialysis.     ferric gluconate 125 mg in sodium chloride 0.9 % 100 mL  Inject 125 mg into the vein every Tuesday with hemodialysis.     fexofenadine 180 MG tablet  Commonly known as:  ALLEGRA  Take 180 mg by mouth daily. For allergies     gabapentin 100 MG capsule  Commonly known as:  NEURONTIN  Take 100 mg by mouth 2 (two) times daily.     hydrocortisone 25 MG suppository  Commonly known as:  ANUSOL-HC  Place 1 suppository (25 mg total) rectally 2 (two) times daily.     lamoTRIgine 25 MG tablet  Commonly known as:  LAMICTAL  Take 50 mg by  mouth at bedtime.     levETIRAcetam 1000 MG tablet  Commonly known as:  KEPPRA  Sun/Mon/Wen/Fri     levETIRAcetam 750 MG tablet  Commonly known as:  KEPPRA  Tue/Th/Sat     levothyroxine 50 MCG tablet  Commonly known as:  SYNTHROID, LEVOTHROID  Take 1 tablet (50 mcg total) by mouth daily before breakfast.     midodrine 10 MG tablet  Commonly known as:  PROAMATINE  Take 2 tablets (20 mg total) by mouth 3 (three) times daily.     pantoprazole 40 MG tablet  Commonly known as:  PROTONIX  Take 1 tablet (40 mg total) by mouth 2 (two) times daily.     polyethylene glycol packet  Commonly known as:  MIRALAX / GLYCOLAX  Take 17 g by mouth 2 (two) times daily.     Propylene Glycol 0.6 % Soln  Apply 1 drop to eye 2 (two) times daily.     RESTASIS 0.05 % ophthalmic emulsion  Generic drug:  cycloSPORINE  Place 1 drop into both eyes 2 (two) times daily.     risperiDONE 0.5 MG tablet  Commonly known as:  RISPERDAL  Take 0.5 mg by mouth at bedtime.     risperiDONE microspheres 37.5 MG injection  Commonly known as:  RISPERDAL CONSTA  Inject 37.5 mg into the muscle every 14 (fourteen) days.     senna-docusate 8.6-50 MG per tablet  Commonly known as:  Senokot-S  Take 2 tablets by mouth 2 (two) times daily.     sertraline 100 MG tablet  Commonly known as:  ZOLOFT  Take 100 mg by mouth daily. Take with 50mg  tablet to equal 150mg      sertraline 50 MG tablet  Commonly known as:  ZOLOFT  Take 50 mg by mouth daily. Take with 100mg  tablet to equal 150mg      sevelamer 800 MG tablet  Commonly known as:  RENAGEL  Take 1,600 mg by mouth 3 (three) times daily with meals.     sodium chloride 0.65 % Soln nasal spray  Commonly known as:  OCEAN  Place 1 spray into both nostrils every 8 (eight) hours as needed for congestion.     SYMBICORT 160-4.5 MCG/ACT inhaler  Generic drug:  budesonide-formoterol  Inhale 2 puffs into the lungs 2 (two) times daily.     topiramate 200 MG tablet   Commonly known as:  TOPAMAX  Take 200 mg by mouth 2 (two) times daily.     traMADol 50 MG tablet  Commonly known as:  ULTRAM  Take 50 mg by mouth every 8 (eight) hours as needed (pain).     zolpidem 5  MG tablet  Commonly known as:  AMBIEN  Take 1 tablet (5 mg total) by mouth at bedtime as needed for sleep.       Allergies  Allergen Reactions  . Iodinated Diagnostic Agents Hives  . Iodine     Other reaction(s): Other (See Comments) Other Reaction: contrast-itching and flushing  . Methylprednisolone Hives  . Other Itching    Other reaction(s): Other (See Comments) Uncoded Allergy. Allergen: iv contrast dye, Other Reaction: flushing Arthritis medications  . Pollen Extract Hives  . Trimethadione     Other reaction(s): Other (See Comments) Other Reaction: Other reaction  . Trazodone And Nefazodone Hives and Rash   Follow-up Information    Follow up with Alvester Morin, MD. Schedule an appointment as soon as possible for a visit in 1 week.   Specialty:  Family Medicine   Why:  Hospital follow-up hypotension, syncope and collapse secondary to GI bleed, adrenal insufficiency, overmedication with pain/psychotropic meds, over dialysis hypothyroidism   Contact information:   [address] [place] [address] [phone]        The results of significant diagnostics from this hospitalization (including imaging, microbiology, ancillary and laboratory) are listed below for reference.    Significant Diagnostic Studies: Ct Head Wo Contrast  09/08/2014   CLINICAL DATA:  Lightheaded and dizzy for 2 weeks  EXAM: CT HEAD WITHOUT CONTRAST  TECHNIQUE: Contiguous axial images were obtained from the base of the skull through the vertex without intravenous contrast.  COMPARISON:  07/31/2011  FINDINGS: The bony calvarium is intact. No gross soft tissue abnormality is noted. Mild atrophic changes are noted. Subependymal calcifications are noted consistent with tuberous  sclerosis. No acute hemorrhage, acute infarction or space-occupying mass lesion is identified.  IMPRESSION: No acute abnormality noted.  Stable subependymal calcifications.   Electronically Signed   By: Inez Catalina M.D.   On: 09/08/2014 13:38   Dg Chest Port 1 View  09/08/2014   CLINICAL DATA:  Sepsis, weakness, and shortness of breath.  EXAM: PORTABLE CHEST - 1 VIEW  COMPARISON:  05/25/2011  FINDINGS: Shallow inspiration. Cardiac enlargement without vascular congestion. No focal airspace disease in the lungs. No blunting of costophrenic angles. No pneumothorax. Interval removal of previous central venous catheter.  IMPRESSION: Cardiac enlargement.  No evidence of active pulmonary disease.   Electronically Signed   By: Lucienne Capers M.D.   On: 09/08/2014 22:08   US Abdomen Limited Ruq  09/12/2014   CLINICAL DATA:  Elevated alkaline phosphatase. Status post cholecystectomy.  EXAM: US ABDOMEN LIMITED - RIGHT UPPER QUADRANT  COMPARISON:  None.  FINDINGS: Gallbladder:  Gallbladder surgically absent.  Common bile duct:  Diameter: 4.6 mm  Liver:  The liver is echogenic. There is attenuation of the ultrasound wave, poor visualization of the internal hepatic architecture, and loss of definition of the diaphragm. No focal hepatic abnormality identified.  Study quality is degraded by patient body habitus.  IMPRESSION: 1. Status post cholecystectomy. 2. Fatty liver.   Electronically Signed   By: Nolon Nations M.D.   On: 09/12/2014 10:23    Microbiology: Recent Results (from the past 240 hour(s))  Clostridium Difficile by PCR     Status: None   Collection Time: 09/11/14 12:57 AM  Result Value Ref Range Status   C difficile by pcr NEGATIVE NEGATIVE Final     Labs: Basic Metabolic Panel:  Recent Labs Lab 09/13/14 0548 09/14/14 0901  NA 132* 130*  K 4.7 4.5  CL 94* 93*  CO2  28 21  GLUCOSE 111* 127*  BUN 52* 69*  CREATININE 7.01* 8.65*  CALCIUM 8.7 8.5  MG 2.1  --   PHOS  --  5.2*   Liver  Function Tests:  Recent Labs Lab 09/13/14 0548 09/14/14 0901  AST 17 24  ALT 12 19  ALKPHOS 209* 219*  BILITOT 0.5 0.8  PROT 6.0 6.2  ALBUMIN 3.2* 3.5   No results for input(s): LIPASE, AMYLASE in the last 168 hours. No results for input(s): AMMONIA in the last 168 hours. CBC:  Recent Labs Lab 09/13/14 0548 09/14/14 0901  WBC 4.5 5.4  NEUTROABS 3.4  --   HGB 9.2* 9.3*  HCT 28.9* 29.4*  MCV 104.0* 103.9*  PLT 149* 140*   Cardiac Enzymes: No results for input(s): CKTOTAL, CKMB, CKMBINDEX, TROPONINI in the last 168 hours. BNP: BNP (last 3 results) No results for input(s): BNP in the last 8760 hours.  ProBNP (last 3 results) No results for input(s): PROBNP in the last 8760 hours.  CBG: No results for input(s): GLUCAP in the last 168 hours.     Signed:  Dia Crawford, MD [facility] [phone] pager

## 2014-09-14 NOTE — Progress Notes (Signed)
Subjective:   Feels good. Got a little lightheaded getting out of bed this AM.   Objective Filed Vitals:   09/13/14 1200 09/13/14 1815 09/13/14 2317 09/14/14 0524  BP: 81/49 94/47 75/36  81/37  Pulse: 60 58 60 60  Temp:  98 F (36.7 C) 97.8 F (36.6 C) 98.2 F (36.8 C)  TempSrc:  Oral Oral Oral  Resp: 10 16 19 18   Height:      Weight:   143.926 kg (317 lb 4.8 oz)   SpO2:  95% 97% 100%   Physical Exam General: alert and oriented, no acute distress. Sitting in chair Heart: RRR Lungs: CTA, unlabored Abdomen: obese. Large abdominal hernia. nontender +BS Extremities:  1+ LE edema Dialysis Access: R thigh AVG +thrill  HD: TTS [facility] 4h 78min 450/800 130.5kg 2/2.0 Bath Profile 4 Heparin 4000 L thigh AVG Mircera 225 mcg last 2/23, Venofer 100/ tues, Calcitriol 0.25 ug tiw po  Assessment: 1. Acute on chronic hypotension - ECHO EF 55-60% (2/26)edw increased. Tolerates BP in 70s- orthostasis this AM, resolved quickly. Increase midodrine. Possible effect of topamax and lamotrigrine  2. Anemia Hb 9.2, on max Mircera and IV Fe, continue Fe, next esa due 1st of March s/p 1 u RBC 2/26 3. ESRD on HD TTS- edw increased. HD pending today 4. Tuberous sclerosis 5. Seizure d/o - on medication 6. Polypharmacy 7. MR/ schizophrenia/ anxiety / depression - on multiple meds   Shelle Iron, NP [place] [phone] 09/14/2014,8:39 AM I have seen and examined this patient and agree with the plan of care seen, examined, eval, counseled.  Will try ^ mido.  May need to change sz meds.  On steroids and T4 .  Silver Parkey L 09/14/2014, 9:14 AM    LOS: 6 days    Additional Objective Labs: Basic Metabolic Panel:  Recent Labs Lab 09/10/14 0240 09/11/14 1247 09/12/14 0500 09/13/14 0548  NA 132* 136 136 132*  K 4.7 4.2 4.0 4.7  CL 92* 99 97 94*  CO2 27 29 31 28   GLUCOSE 137* 110* 123* 111*  BUN 53* 33* 31* 52*  CREATININE 7.58* 5.64* 5.27* 7.01*  CALCIUM  8.0* 8.3* 8.9 8.7  PHOS 5.2*  --   --   --    Liver Function Tests:  Recent Labs Lab 09/10/14 0240 09/12/14 0500 09/13/14 0548  AST 13 12 17   ALT 8 9 12   ALKPHOS 252* 214* 209*  BILITOT 0.8 0.6 0.5  PROT 6.4 6.2 6.0  ALBUMIN 3.0* 3.3* 3.2*   No results for input(s): LIPASE, AMYLASE in the last 168 hours. CBC:  Recent Labs Lab 09/09/14 0421 09/10/14 0240 09/11/14 1247 09/12/14 0500 09/13/14 0548  WBC 4.6 4.5 4.8 4.3 4.5  NEUTROABS  --  4.0  --  3.4 3.4  HGB 7.6* 8.3* 9.0* 9.2* 9.2*  HCT 25.3* 26.9* 29.2* 30.0* 28.9*  MCV 107.7* 104.3* 102.8* 107.1* 104.0*  PLT 143* 147* 150 136* 149*   Blood Culture    Component Value Date/Time   SDES BLOOD ARM LEFT 09/08/2014 1815   SPECREQUEST BOTTLES DRAWN AEROBIC ONLY 5CC 09/08/2014 1815   CULT  09/08/2014 1815           BLOOD CULTURE RECEIVED NO GROWTH TO DATE CULTURE WILL BE HELD FOR 5 DAYS BEFORE ISSUING A FINAL NEGATIVE REPORT Performed at [facility] PENDING 09/08/2014 1815    Cardiac Enzymes:  Recent Labs Lab 09/08/14 1217  TROPONINI 0.04*   CBG:  Recent  Labs Lab 09/09/14 0313 09/09/14 1107 09/09/14 1600 09/09/14 2000  GLUCAP 110* 174* 152* 132*   Iron Studies: No results for input(s): IRON, TIBC, TRANSFERRIN, FERRITIN in the last 72 hours. @lablastinr3 @ Studies/Results: No results found. Medications:   . ALPRAZolam  0.25 mg Oral Q T,Th,Sa-HD  . antiseptic oral rinse  7 mL Mouth Rinse q12n4p  . budesonide-formoterol  2 puff Inhalation BID  . calcitRIOL  0.25 mcg Oral Q T,Th,Sa-HD  . calcium acetate  1,334 mg Oral TID WC  . chlorhexidine  15 mL Mouth Rinse BID  . cycloSPORINE  1 drop Both Eyes BID  . darbepoetin (ARANESP) injection - DIALYSIS  200 mcg Intravenous Q Tue-HD  . docusate sodium  100 mg Oral BID  . ferric gluconate (FERRLECIT/NULECIT) IV  125 mg Intravenous Q Tue-HD  . fluticasone  2 spray Each Nare Daily  . gabapentin  100 mg Oral BID  . heparin  5,000 Units  Subcutaneous 3 times per day  . hydrocortisone  25 mg Rectal BID  . hydrocortisone sodium succinate  50 mg Intravenous Q12H  . lamoTRIgine  50 mg Oral QHS  . levETIRAcetam  1,000 mg Oral Once per day on Sun Mon Wed Fri   And  . levETIRAcetam  1,500 mg Oral Once per day on Tue Thu Sat  . levothyroxine  50 mcg Oral QAC breakfast  . midodrine  10 mg Oral TID WC  . multivitamin  1 tablet Oral QHS  . pantoprazole  40 mg Oral Daily  . polyethylene glycol  17 g Oral BID  . risperiDONE  0.5 mg Oral QHS  . senna-docusate  1 tablet Oral BID  . sertraline  150 mg Oral QHS  . sevelamer carbonate  800 mg Oral TID WC  . sodium chloride  3 mL Intravenous Q12H  . topiramate  200 mg Oral BID

## 2014-09-14 NOTE — Progress Notes (Signed)
Report called to [facility]. 

## 2014-09-14 NOTE — Procedures (Signed)
I was present at this session.  I have reviewed the session itself and made appropriate changes.  BP low to start, no sx.  Keep >70.  avg R groin  Wendolyn Raso L 3/1/20169:13 AM

## 2014-09-15 LAB — CULTURE, BLOOD (ROUTINE X 2)
Culture: NO GROWTH
Culture: NO GROWTH

## 2014-10-19 ENCOUNTER — Inpatient Hospital Stay
Admit: 2014-10-19 | Disposition: A | Discharge status: Skilled Nursing Facility | Payer: Self-pay | Attending: Internal Medicine | Admitting: Internal Medicine

## 2014-10-19 LAB — COMPREHENSIVE METABOLIC PANEL
ANION GAP: 13 (ref 7–16)
AST: 20 U/L
Albumin: 4.3 g/dL
Alkaline Phosphatase: 277 U/L — ABNORMAL HIGH
BUN: 29 mg/dL — ABNORMAL HIGH
Bilirubin,Total: 0.3 mg/dL
Calcium, Total: 9.1 mg/dL
Chloride: 95 mmol/L — ABNORMAL LOW
Co2: 34 mmol/L — ABNORMAL HIGH
Creatinine: 3.89 mg/dL — ABNORMAL HIGH
GFR CALC AF AMER: 14 — AB
GFR CALC NON AF AMER: 12 — AB
Glucose: 112 mg/dL — ABNORMAL HIGH
Potassium: 3.9 mmol/L
SGPT (ALT): 15 U/L
Sodium: 142 mmol/L
TOTAL PROTEIN: 7.9 g/dL

## 2014-10-19 LAB — TROPONIN I: Troponin-I: 0.03 ng/mL

## 2014-10-19 LAB — CBC
HCT: 33.6 % — ABNORMAL LOW (ref 35.0–47.0)
HGB: 10.7 g/dL — ABNORMAL LOW (ref 12.0–16.0)
MCH: 33 pg (ref 26.0–34.0)
MCHC: 31.8 g/dL — AB (ref 32.0–36.0)
MCV: 104 fL — AB (ref 80–100)
PLATELETS: 110 10*3/uL — AB (ref 150–440)
RBC: 3.23 10*6/uL — ABNORMAL LOW (ref 3.80–5.20)
RDW: 20.5 % — AB (ref 11.5–14.5)
WBC: 5.5 10*3/uL (ref 3.6–11.0)

## 2014-10-20 LAB — BASIC METABOLIC PANEL
Anion Gap: 13 (ref 7–16)
BUN: 42 mg/dL — AB
CHLORIDE: 96 mmol/L — AB
CO2: 31 mmol/L
CREATININE: 5.33 mg/dL — AB
Calcium, Total: 9.3 mg/dL
GFR CALC AF AMER: 10 — AB
GFR CALC NON AF AMER: 8 — AB
GLUCOSE: 87 mg/dL
Potassium: 4.3 mmol/L
Sodium: 140 mmol/L

## 2014-10-20 LAB — CBC WITH DIFFERENTIAL/PLATELET
BASOS ABS: 0 10*3/uL (ref 0.0–0.1)
Basophil %: 0.5 %
Eosinophil #: 0.1 10*3/uL (ref 0.0–0.7)
Eosinophil %: 1.7 %
HCT: 32.8 % — ABNORMAL LOW (ref 35.0–47.0)
HGB: 10.2 g/dL — ABNORMAL LOW (ref 12.0–16.0)
LYMPHS PCT: 25.7 %
Lymphocyte #: 1.1 10*3/uL (ref 1.0–3.6)
MCH: 32.5 pg (ref 26.0–34.0)
MCHC: 31.3 g/dL — ABNORMAL LOW (ref 32.0–36.0)
MCV: 104 fL — ABNORMAL HIGH (ref 80–100)
MONO ABS: 0.3 x10 3/mm (ref 0.2–0.9)
Monocyte %: 6.2 %
Neutrophil #: 2.7 10*3/uL (ref 1.4–6.5)
Neutrophil %: 65.9 %
Platelet: 110 10*3/uL — ABNORMAL LOW (ref 150–440)
RBC: 3.15 10*6/uL — ABNORMAL LOW (ref 3.80–5.20)
RDW: 20.1 % — AB (ref 11.5–14.5)
WBC: 4.2 10*3/uL (ref 3.6–11.0)

## 2014-10-20 LAB — FOLATE: Folic Acid: 11.2 ng/mL

## 2014-10-21 LAB — RENAL FUNCTION PANEL
ALBUMIN: 3.5 g/dL
Anion Gap: 13 (ref 7–16)
BUN: 61 mg/dL — ABNORMAL HIGH
CALCIUM: 8.7 mg/dL — AB
CHLORIDE: 96 mmol/L — AB
CO2: 28 mmol/L
Creatinine: 6.42 mg/dL — ABNORMAL HIGH
EGFR (African American): 8 — ABNORMAL LOW
EGFR (Non-African Amer.): 7 — ABNORMAL LOW
Glucose: 121 mg/dL — ABNORMAL HIGH
PHOSPHORUS: 4.7 mg/dL — AB
Potassium: 4 mmol/L
Sodium: 137 mmol/L

## 2014-10-21 LAB — CBC WITH DIFFERENTIAL/PLATELET
Basophil #: 0 10*3/uL (ref 0.0–0.1)
Basophil %: 0.3 %
Eosinophil #: 0.1 10*3/uL (ref 0.0–0.7)
Eosinophil %: 2.1 %
HCT: 30.1 % — AB (ref 35.0–47.0)
HGB: 9.5 g/dL — ABNORMAL LOW (ref 12.0–16.0)
Lymphocyte #: 1.2 10*3/uL (ref 1.0–3.6)
Lymphocyte %: 30.6 %
MCH: 32.5 pg (ref 26.0–34.0)
MCHC: 31.5 g/dL — AB (ref 32.0–36.0)
MCV: 103 fL — ABNORMAL HIGH (ref 80–100)
Monocyte #: 0.2 x10 3/mm (ref 0.2–0.9)
Monocyte %: 4.3 %
NEUTROS ABS: 2.4 10*3/uL (ref 1.4–6.5)
Neutrophil %: 62.7 %
Platelet: 107 10*3/uL — ABNORMAL LOW (ref 150–440)
RBC: 2.92 10*6/uL — ABNORMAL LOW (ref 3.80–5.20)
RDW: 19.6 % — ABNORMAL HIGH (ref 11.5–14.5)
WBC: 3.8 10*3/uL (ref 3.6–11.0)

## 2014-10-24 LAB — CULTURE, BLOOD (SINGLE)

## 2014-11-02 NOTE — Consult Note (Signed)
Comments   I met with pt's father, stepmother and brother. Updated them on pt's current medical status. Concerning code status, family are aware that pt has made the decision to be a DNR and feel that she understands the significance of the decision. Family respects her decision and agree with DNR status. all live in [place] and would very much like for pt to be able to live in [facility]. I discussed with CM who will start bed search.   Electronic Signatures: Christen Wardrop, Izora Gala (MD)  (Signed 17-Sep-13 16:27)  Authored: Palliative Care   Last Updated: 17-Sep-13 16:27 by Gionni Freese, Izora Gala (MD)

## 2014-11-02 NOTE — Discharge Summary (Signed)
PATIENT NAME:  [name], [name] MR#:  [id number] DATE OF BIRTH:  [birth year]  DATE OF ADMISSION:  02/27/2012 DATE OF DISCHARGE:  03/02/2012  ADDENDUM: The patient's final discharge dictation was done yesterday. The patient did not go yesterday, complained of severe chest pain. There was constipation and blood in the stool so stool guaiacs were sent which were negative. The patient has no pain today. She complained of some chest pain, but troponins were negative and EKG showed normal sinus. The patient's blood pressure is stable, heart rate is stable. She is mentating well. She has no other complaints. This morning the patient's heart rate was 70, blood pressure 120/70, and saturations 100% on room air. She is afebrile. She can go to [facility] today and she can followup for routine dialysis on Tuesday. ____________________________ Epifanio Lesches, MD sk:slb D: 03/02/2012 09:48:01 ET T: 03/03/2012 10:38:15 ET JOB#: [id number]  cc: Epifanio Lesches, MD, <Dictator> Epifanio Lesches MD ELECTRONICALLY SIGNED 03/19/2012 10:19

## 2014-11-02 NOTE — Discharge Summary (Signed)
PATIENT NAME:  [name], [name] MR#:  [id number] DATE OF BIRTH:  1956-05-12  DATE OF ADMISSION:  02/15/2012 DATE OF DISCHARGE:  02/16/2012  ADMISSION DIAGNOSES:  1. Chest pain.  2. Lone episode of atrial fibrillation.  3. Fever. 4. Hypothyroidism.   DISCHARGE DIAGNOSES:  1. Chest pain with other vague symptoms atypical in nature with negative troponin.  2. Lone episode of atrial fibrillation.  3. Fever. 4. Hypothyroidism.  5. Thrombocytopenia.  6. Endstage renal disease on hemodialysis. 7. History of seizures.  8. History of cardiomyopathy.   CONSULTS: Dr. Murlean Iba   LABORATORY, DIAGNOSTIC, AND RADIOLOGICAL DATA:  Discharge white blood cells 2.1, hemoglobin 9.8, hematocrit 29.1, platelets 83. Troponins times three are negative. FT4 is 1.11, TSH 0.267.   Chest x-ray shows no acute cardiopulmonary disease. Blood cultures negative to date.   HOSPITAL COURSE: This is a 59 year old female who presented with various complaints with chest pain, back pain, shoulder pain, and knee pain as well as fever. For further details, please refer to the History and Physical.  1. Chest pain and generalized pain. Likely from fever.  No source of fevers as they have resolved. Blood cultures are negative to date. The patient has various nonspecific complaints.  2. Endstage renal disease on hemodialysis. The patient will continue hemodialysis as scheduled.  3. Fever. The patient has a history of MSSA sepsis with HeRO graft in April 2013. She was initially empirically placed on IV vancomycin. Blood cultures are negative to date. Fever has resolved.  4. Chronic systolic heart failure, ejection fraction of less than 25% by echocardiogram 10/2011. RV severely dilated. The patient is on Coreg and lisinopril. This was stable. 5. Elevated TSH. The patient will need to be rechecked in one month. Her T4 was normal. 6. Lone atrial fibrillation, resolved. Likely due to underlying fever.  7. Seizure disorder, on  Keppra.  8. Anxiety, depression, which are stable.   DISCHARGE MEDICATIONS:  1. Topamax 200 mg b.i.d.  2. Flonase 50 mcg two sprays nasally daily.  3. Advair Diskus 250/50 b.i.d.  4. Cymbalta 60 mg daily.  5. Lisinopril 5 mg daily. 6. Coreg 3.125 b.i.d.  7. Aspirin 81 mg daily.  8. PhosLo 667 mg, 2 tablets t.i.d.  9. Claritin 10 mg daily.  10. Nepro liquid one can b.i.d.  11. Xanax 0.25 mg q. 6 hours p.r.n. anxiety.  12. Nitroglycerin 0.4 q. 5 minutes p.r.n. chest pain.  13. Keppra 1000 mg daily.  14. Keppra 500 mg on Tuesday, Thursday, Saturday.  15. Primidone 250 mg 1 tablet b.i.d.  16. Rena-Vite 1 tablet daily. 17. Delsym 12 hour cough relief q.12 hours p.r.n.  18. Renvela 800 mg b.i.d. with snacks.  19. Tylenol 325 q. 4 hours p.r.n.  20. Oxycodone 5 mg q. 4 hours p.r.n. pain.  21. Renvela 800 mg 2 tablets t.i.d.   DISCHARGE DIET: Renal diet.   DISCHARGE FOLLOWUP: The patient can follow up with Dr. Delana Meyer in one week .   REFERRAL: Physical therapy.   TIME SPENT: Approximately 35 minutes.      ____________________________ Donell Beers. Benjie Karvonen, MD [id number] D: 02/16/2012 14:26:55 ET T: 02/16/2012 14:47:25 ET JOB#: [id number]  cc: [employer] Southers P. Benjie Karvonen, MD, <Dictator> Katha Cabal, MD Donell Beers Salena Ortlieb MD ELECTRONICALLY SIGNED 02/17/2012 22:05

## 2014-11-02 NOTE — Consult Note (Signed)
PATIENT NAME:  [name], [name] MR#:  [id number] DATE OF BIRTH:  [birth year]  DATE OF CONSULTATION:  06/16/2012  REFERRING PHYSICIAN:   CONSULTING PHYSICIAN:  Isaias Cowman, MD  PRIMARY CARE PHYSICIAN: [facility]   CHIEF COMPLAINT: Chest discomfort.   REASON FOR CONSULTATION: Consultation requested for evaluation of chest discomfort.   HISTORY OF PRESENT ILLNESS: The patient was admitted on 06/13/2012 when she was noted to be anemic with a hemoglobin of 7.1. The patient was seen in consultation by Dr. Vira Agar from GI who did not recommend repeat colonoscopy. The patient underwent blood transfusion with a follow-up hemoglobin of 8.6. The patient describes chest discomfort in the substernal area. She describes it as knifelike pain which comes and goes in and occurs every day since October when she underwent defibrillator placement at [facility]. Chest discomfort is worse when she takes a breath in. Cardiac enzymes and troponin were negative throughout the hospitalization. EKG has been unchanged. EKG is atrial fibrillation with right bundle branch block at baseline.   PAST MEDICAL HISTORY:  1. 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  2. Hypertension.  3. Endstage renal disease on chronic hemodialysis. 4. Seizure disorder.  5. Asthma.  6. Anemia of chronic disease.  7. Depression.   MEDICATIONS ON ADMISSION:  1. Aspirin 81 mg daily.  2. Carvedilol 3.125 mg b.i.d.  3. Advair 1 puff b.i.d.  4. Colace 100 mg b.i.d.  5. Cymbalta 60 mg daily.  6. Allegra 180 mg daily.  7. Keppra 1000 mg Monday, Wednesday, Friday, Sunday. 500 mg Tuesday, Thursday, Saturday. Lidoderm 5% patch b.i.d.  8. Protonix 40 mg daily.  9. PhosLo 2 capsules t.i.d.  10. Primidone 125 mg daily.  11. Renvela 800 mg, 2 tablets t.i.d.  12. Topamax 200 mg b.i.d.  13. Tramadol 50 mg q. 6 p.r.n.  14. Tylenol 650 mg q.  6 p.r.n.   SOCIAL HISTORY: The patient currently resides at [facility]. She denies tobacco or EtOH abuse.   FAMILY HISTORY: No immediate family history of coronary artery disease or myocardial infarction.   REVIEW OF SYSTEMS: CONSTITUTIONAL: The patient does have fatigue. EYES: No blurry vision. EARS: No hearing loss. RESPIRATORY: The patient does have exertional dyspnea. CARDIOVASCULAR: Chest discomfort as described above. GASTROINTESTINAL: No nausea, vomiting, diarrhea, or constipation. GU: No dysuria or hematuria. ENDOCRINE: No polyuria or polydipsia. The patient is anuric.  HEMATOLOGIC: The patient is anemic.  MUSCULOSKELETAL: The patient has diffuse arthritis. NEUROLOGIC: The patient had no focal muscle weakness or numbness. PSYCHOLOGIC: No depression or anxiety.   PHYSICAL EXAMINATION:  VITAL SIGNS: Blood pressure 98/67, pulse 110 and irregularly irregular, respirations 20, temperature 97.9, pulse oximetry 97%.   HEENT: Pupils equal, reactive to light and accommodation.   NECK: Supple without thyromegaly.   LUNGS: Decreased breath sounds in both bases.   HEART: Normal jugular venous pressure. Normal point of maximal impulse. Irregularly irregular rhythm. Normal S1, S2. No appreciable gallop, murmur, or rub.   ABDOMEN: Soft and nontender.   EXTREMITIES: Trace to 1+ bilateral pedal edema.   MUSCULOSKELETAL: Normal muscle tone.   NEUROLOGIC: The patient is alert and oriented times three. Motor and sensory both grossly intact.   IMPRESSION: This is a 59 year old female who was admitted with anemia who underwent transfusion. The patient has chest pain with atypical features described as knifelike and pleuritic in nature with negative cardiac enzymes and troponin.   RECOMMENDATIONS:  1.  Would defer full dose anticoagulation. 2. Would defer further cardiac diagnostics.  3. May consider work-up for non-cardiovascular chest pain such as a GI work-up, which could be done as an  outpatient.    ____________________________ Isaias Cowman, MD ap:bjt D: 06/16/2012 21:33:16 ET T: 06/17/2012 11:02:05 ET JOB#: [id number]  cc: Isaias Cowman, MD, <Dictator> Isaias Cowman MD ELECTRONICALLY SIGNED 06/27/2012 14:28

## 2014-11-02 NOTE — H&P (Signed)
Past Med/Surgical Hx:  Dialysis:   CHF:   COPD:   Anemia:   Renal Failure:   MRSA within past 6 months:   Hernia:   seizures:   ALLERGIES:  Iodine: Hives  Medrol: Hives  Contrast - Iodinated Radiocontrast Dye: Hives  Trazodone: Unknown    Electronic Signatures: Vilinda Boehringer (MD)  (Signed [phone] 23:45)  Authored: PAST MEDICAL/SURGIAL HISTORY, ALLERGIES, HOME MEDICATIONS, ASSESSMENT AND PLAN   Last Updated: 09-Nov-13 23:45 by Vilinda Boehringer (MD)

## 2014-11-02 NOTE — Consult Note (Signed)
PATIENT NAME:  [name], [name] MR#:  [id number] DATE OF BIRTH:  [birth year]  DATE OF CONSULTATION:  04/01/2012  REFERRING PHYSICIAN:  Lorelee Market, MD / Francene Castle, MD CONSULTING PHYSICIAN:  Dwayne D. Callwood, MD  INDICATION: Bradycardia, abnormal EKG, and end-stage renal disease.   HISTORY OF PRESENT ILLNESS: [name] is a 59 year old white female who presented with bradycardia, hyperkalemia, endstage renal disease, seizure disorder, hypertension, asthma, and morbid obesity. She was at dialysis and had an episode of chest pain and near fainting. The patient was evaluated and found to have recurrent constant chest pain off and on without radiation. She was found to have a slow heart rate in the 20s with elevated potassium. She was sent to the Emergency Room for evaluation. Consideration for pacemaker was made, but it was determined that she probably had hyperkalemia and treatment for that was indicated as well as dialysis. The patient states that she felt like she may or may not have had a seizure and may or may not have had near syncope.   REVIEW OF SYSTEMS: Syncope, near blackout spell. No nausea or vomiting. No fever, no chills, and no sweats. No weight loss and no weight gain. No hemoptysis and no hematemesis. Denies bright red blood per rectum. No vision change or hearing change. Denies sputum production or cough.   PAST MEDICAL HISTORY:  1. Hypertension. 2. Endstage renal disease. 3. Congestive heart failure. 4. Seizure disorder. 5. Bradycardia. 6. Hyperkalemia.  7. Asthma.  8. Secondary hyperparathyroidism.  9. Hypertension.  10. Methicillin-resistant Staphylococcus aureus.   SOCIAL HISTORY: She lives in a nursing home, [place].   FAMILY HISTORY: Noncontributory.   PHYSICAL EXAMINATION:   VITAL SIGNS: Blood pressure 120/50, pulse 26, respiratory rate 12, and afebrile.   HEENT: Normocephalic, atraumatic. Pupils equal, round, and reactive to light.   NECK:  Supple. No significant jugular venous distention.   LUNGS: Clear to auscultation and percussion. No significant wheeze, rhonchi, or rales.   HEART: Extreme bradycardia. Soft murmur anteriorly at left sternal border. PMI is nondisplaced.   ABDOMEN: Benign.   EXTREMITIES: Mild edema.   NEUROLOGIC: Intact.   SKIN: Normal.   MEDICATIONS: The patient has not provided her medications at this point. She will try to provide them later.  LABORATORY/DIAGNOSTIC DATA: Troponin 0.04. CK 55 and MB 1.1. White count 8.4, hemoglobin 12.9, and platelet count 182. Glucose 168, BUN 11, creatinine 7.36, sodium 134, potassium 6.8, chloride 97, and CO2 21.   EKG: Severe bradycardia with rate of 26 and wide QRS, junctional.   ASSESSMENT:  1. Severe bradycardia. 2. Hyperkalemia.  3. End-stage renal disease. 4. Pulmonary edema. 5. Chest pain. 6. Seizure disorder. 7. Morbid obesity. 8. Hypertension. 9. Heart failure.  10. Cardiomyopathy.  11. Asthma.   PLAN: Agree with admit. We will hold off on temporary pacemaker. At this point we will treat her hyperkalemia likely with calcium chloride, bicarbonate, and Kayexalate. We will also consider glucose and insulin. We will ultimately treat with Kayexalate and then dialysis. We will try to arrange to have her medication list provided and treat her with her current medications medically. Echocardiogram would be helpful. In the meantime, we will treat the patient medically in the interim, transfer to the unit and provide early dialysis; this is her dialysis day and she has not started and presented with hyperkalemia. Continue current therapy for now. We will place temporary pacemaker if her symptoms do not improve.  ____________________________ Loran Senters. Clayborn Bigness, MD ddc:slb D: 04/01/2012 14:33:59  ET T: 04/01/2012 15:20:19 ET JOB#: [id number]  cc: Dwayne D. Clayborn Bigness, MD, <Dictator> Yolonda Kida MD ELECTRONICALLY SIGNED 05/14/2012 15:01

## 2014-11-02 NOTE — H&P (Signed)
PATIENT NAME:  [name], SCHRUPP MR#:  [id number] DATE OF BIRTH:  18-Jan-1956  DATE OF ADMISSION:  06/13/2012  ADMITTING PHYSICIAN: Gladstone Lighter, MD  PRIMARY CARE PHYSICIAN: From [facility].    CHIEF COMPLAINT: Sent in for low hemoglobin.     HISTORY OF PRESENT ILLNESS: [name] is a 59 year old Caucasian female with history of tuberous sclerosis status post bilateral nephrectomy, on hemodialysis in 2007, Tuesday, Thursday, Saturday schedule, hypertension, arthritis, and hypertension, arthritis, and anemia of chronic disease, was sent in from [facility] secondary to hemoglobin checked of 7.1 while at [facility]. She has not had any significant complaints other than she complained of chest pressure here and also complained of dyspnea which provoked them to check her hemoglobin.  Of note, her baseline hemoglobin seems to be around 8.9 to 9 at baseline. She does get Procrit with dialysis. She did have prior blood transfusions, but none recently. She denies any fevers, chills but she was tachycardic and also hypertensive in the ER here for which reason she is being admitted at this time.   PAST MEDICAL HISTORY:  1. Hypertension.  2. End-stage renal disease, on hemodialysis Tuesday, Thursday, Saturday schedule. 3. Congestive heart failure, ejection fraction of less than 25% based on recent echo in April 2013.  4. Seizure disorder.  5. Bradycardia, complete heart block, status post pacemaker placement.  6. Asthma. 7. Secondary hypothyroidism.  8. Anemia of chronic disease.  9. History of MRSA sepsis in April 2013. 10. Depression.   PAST SURGICAL HISTORY:  1. Pacemaker placement and automatic implantable cardiac defibrillator placement for congestive heart failure and complete heart block.  2. Bilateral nephrectomy. 3. Tonsillectomy.  4. Cholecystectomy.   ALLERGIES TO MEDICATIONS: Iodine, trazodone, and IV Medrol.   CURRENT MEDICATIONS:  1. Advair 20/50 one puff b.i.d.    2. Aspirin 81 mg p.o. daily.  3. Colace 100 mg p.o. b.i.d.  4. Coreg 3.125 mg p.o. b.i.d.  5. Cymbalta 60 mg p.o. daily.  6. Allegra 180 mg p.o. daily.  7. Flonase.   8. MVI daily.  9. Keppra 1000 mg 1 tablet Monday, Wednesday, Friday and Sunday and with 1 tablet 500 mg extra on dialysis days.  10. Lidoderm 5% patch apply to affected area b.i.d.  11. Protonix 40 mg daily.  12. PhosLo 2 capsules 3 times a day.  13. Primidone 125 mg q. daily.  14. Renvela 800 mg 1 tablet p.o. at bedtime.  15. Renvela 800 mg 2 tablets 3 times daily.  16. Topamax 200 mg p.o. b.i.d.  17. Tramadol 50 mg p.o. q.6 hours p.r.n. 18. Tylenol 650 mg q.6 hours p.r.n.  SOCIAL HISTORY: From [facility].  Does not smoke. No alcohol or drug use.   FAMILY HISTORY: Father with diabetes and history of strokes, mother with hearing loss.   REVIEW OF SYSTEMS. CONSTITUTIONAL: Positive for fatigue and weakness. No fever. EYES: No blurred vision, double vision, or glaucoma. Uses reading glasses. ENT: No tinnitus, ear pain, hearing loss, epistaxis or discharge. RESPIRATORY: Positive for dyspnea, no cough, wheeze, hemoptysis. CARDIOVASCULAR: Positive for chest pressure now relieved. No orthopnea, edema, arrhythmia, palpitations, or syncope. GI: No nausea, vomiting, diarrhea, abdominal pain, hematemesis, or melena. GENITOURINARY: Patient is anuric.  ENDOCRINE:  No polyuria, nocturia, thyroid problems, heat or cold intolerance. HEMATOLOGY: Positive for anemia. No bruising or bleeding. SKIN: No acne, rash, or lesions. MUSCULOSKELETAL: Positive for arthritis. NEUROLOGIC: No CVA, transient ischemic attack. Positive for seizure disorder. PSYCHOLOGICAL: No anxiety, insomnia,  depression currently.   PHYSICAL EXAMINATION:  VITAL SIGNS: Temperature 97.9 degrees Fahrenheit, pulse 121, respirations 32, blood pressure 78/60, pulse oximetry 100% on oxygen.     GENERAL: A well-developed, well-nourished female lying in bed, not in any acute  distress.   HEENT: Normocephalic, atraumatic. Pupils equal, round, reacting to light. Anicteric sclerae. Extraocular movements intact. Oropharynx clear without erythema, mass or exudates. Lot of papules present on the face secondary to her underlying tuberous sclerosis .   NECK: Supple.  No thyromegaly, JVD, carotid bruits.    LUNGS: Moving air bilaterally. No wheezes or crackles. Fine rales at the bottom of the lungs. No use of accessory muscles for breathing.   CARDIOVASCULAR: S1, S2 regular rate and rhythm. No murmurs, rubs, or gallops.   ABDOMEN: Obese, soft, nontender, nondistended. No hepatosplenomegaly.  Normal bowel sounds.   EXTREMITIES: No pedal edema. The patient does have poor dorsalis pedis pulses palpable bilaterally. No clubbing or cyanosis.   SKIN: Other than the tuberous sclerosis lesions mentioned on the face, no other acute changes.   NEUROLOGIC: Cranial nerves intact. No new focal motor or sensory deficits.   PSYCHOLOGICAL: The patient is awake, alert, oriented x3.   LABORATORY DATA: WBC 5.4, hemoglobin 10.7, hematocrit 24.2, platelet count 217,000.  Sodium 132, potassium 4.0, chloride 97, bicarbonate 31, BUN 33, creatinine 4.5, glucose 78, calcium of 9.0.  ALT 16, AST 19, alkaline phosphatase 98, total bilirubin 0.4, albumin of 3.1. Troponin is less than 0.02. Chest x-ray showing pulmonary interstitial findings consistent with congestive heart failure, left pleural effusion. Appearance has improved since previous study.   ASSESSMENT AND PLAN: [name] with a history of end-stage renal disease, on hemodialysis whose last dialysis was Wednesday, anemia of chronic disease, baseline hemoglobin of 9, arthritis, from [facility] was sent in for hemoglobin of 7.0 with hypertension and dyspnea.  1. Acute on chronic anemia, symptomatic at this time and tachycardia and hypertension. We will transfuse with 1 unit of packed RBC and recheck hemoglobin. Does not appear  to have any active bleeding. Not sure if this is just from her renal disease as no active bleeding. Check Hemoccult and continue Procrit with dialysis.  2. Hypertension, could be just from the anemia. With the tachycardia we have to rule out infection or sepsis and also hypovolemia.  Her dialysis was more than two days ago and less likely to be hypovolemic. Chest x-ray findings are stable. Patient is afebrile and a white count will be ordered, blood cultures. Hold off on antibiotics at this time. Continue to monitor blood pressure. Lungs still at baseline on exam so if needed will give gentle IV fluids.  3. End-stage renal disease, on hemodialysis. She is on Tuesday, Thursday, Saturday dialysis schedule.  Because of Thanksgiving yesterday her last dialysis was done on Tuesday and Wednesday so she is due for dialysis tomorrow. Continue her PhosLo and Renvela and consult Nephrology.  4. Hypertension. The patient is hypertensive at this time so we are going to hold her medications. 5. Gastrointestinal prophylaxis, on Protonix.   CODE STATUS: FULL CODE.   TIME SPENT ON ADMISSION: 50 minutes.  ____________________________ Gladstone Lighter, MD rk:vtd D: 06/13/2012 17:31:10 ET T: 06/14/2012 07:04:39 ET JOB#: [id number]  cc: Gladstone Lighter, MD, <Dictator> Jamal Maes, MD Gladstone Lighter MD ELECTRONICALLY SIGNED 06/22/2012 13:53

## 2014-11-02 NOTE — Op Note (Signed)
PATIENT NAME:  [name], [name] MR#:  [id number] DATE OF BIRTH:  11-Aug-1955  DATE OF PROCEDURE:  02/08/2012  PREOPERATIVE DIAGNOSES:  1. End-stage renal disease requiring hemodialysis.  2. Complication of dialysis device.  3. Superior vena cava syndrome.   POSTOPERATIVE DIAGNOSES: 1. End-stage renal disease requiring hemodialysis.  2. Complication of dialysis device.  3. Superior vena cava syndrome.   PROCEDURE PERFORMED: Creation of a left arm brachial axillary dialysis graft.   SURGEON: Katha Cabal, MD    ASSISTANT: Real Cons, PA   ANESTHESIA: General by LMA.    FLUIDS: Per anesthesia record.   ESTIMATED BLOOD LOSS: 75 mL.   COMPLICATIONS: None.   INDICATIONS: [name] is a 59 year old woman who has had multiple accesses in both arms. 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 She appears to have sufficient collaterals to maintain and support a left brachial axillary dialysis graft. The risks and benefits were reviewed, all questions answered. The patient agrees to proceed.   PROCEDURE: The patient is taken to the operating room and placed in the supine position. After adequate general anesthesia is induced, her left arm is positioned palm upward and extended. Left arm is prepped and draped in a sterile fashion.   Beginning with the venous incision, previous scar is re-incised with the incision extended more medially essentially onto the chest wall. The incision is then carried down through the soft tissues to expose a remnant of a previous axillary graft. This is then followed down to identify the axillary vein. Axillary vein is dissected for approximately 4 cm more proximally to the point the chest wall has been exposed. Vein is looped proximally and distally.   The antecubital crease is then identified and the previous incisional scar is re-incised and the brachial artery is subsequently localized. It is then  dissected circumferentially and looped proximally and distally.   Using a Gore tunneling device, arch is created subcutaneously and a 4 to 7 mm tapered Propatent Gore graft is pulled through the device. The brachial artery is occluded proximally and distally with vascular clamps, arteriotomy is made, extended with Potts scissors, and stay suture of 6-0 Prolene is placed. A 4 mm portion of the graft is beveled and an end graft to side brachial artery anastomosis is fashioned with running CV-6 suture. Graft is then pressurized, checked for placement. It is free of kinks. It is then flushed with heparinized saline and clamped just above the arterial anastomosis. Incision is packed with gauze. With the vein in its native bed, the graft is laid down onto the vein. It is then marked with a surgical marker. The vein is then delivered into the surgical field and occluded with Silastic vessel loops. Arteriotomy is made with an 11 blade and extended with Potts scissors. Once this has been performed and adequacy of the vein for anastomosis has been verified, the graft is trimmed to an appropriate level at the previously marked site. An end graft to side vein anastomosis is fashioned with running CV-6 suture. Flushing maneuvers are performed and flow is established through the graft. Soft continuous thrill is noted in the graft with no pulsatility. Both wounds were then irrigated and closed in multiple layers using 3-0 Vicryl followed by 4-0 Monocryl subcuticular and Dermabond. The patient tolerated the procedure well. There were no immediate complications. Sponge and needle counts were correct. She was taken to the recovery area in excellent condition.   ____________________________  Katha Cabal, MD ggs:drc D: 02/09/2012 14:56:16 ET T: 02/09/2012 15:25:04 ET JOB#: [id number]  cc: Katha Cabal, MD, <Dictator> Meindert A. Brunetta Genera, MD Munsoor Lilian Kapur, MD Murlean Iba, MD Katha Cabal  MD ELECTRONICALLY SIGNED 02/09/2012 17:20

## 2014-11-02 NOTE — H&P (Signed)
PATIENT NAME:  [name], [name] MR#:  [id number] DATE OF BIRTH:  1956/06/27  DATE OF ADMISSION:  02/15/2012  REFERRING PHYSICIAN:  Dr. Margarita Grizzle PRIMARY CARE PHYSICIAN:  Dr. Lacie Scotts  CHIEF COMPLAINT:  Ache all over.   HISTORY OF PRESENT ILLNESS: This is a 59 year old female with significant past medical history of tubular sclerosis with endstage renal disease on hemodialysis for the last six years, on hemodialysis Tuesday, Thursday, Saturday, recent diagnosis of  MSSA bacteremia status post left temporary catheter removal and reinsertion during last admission in April secondary to MSSA bacteremia. The patient had an episode of fever during her hemodialysis today. The patient had recent left upper arm AV graft inserted by Dr. Gilda Crease on July 26. The patient had blood cultures sent at the dialysis center today and was started on vancomycin. The patient reports she had pain all over at home which prompted her to come to the Emergency Department.  As well she reports chest pain and epigastric pain which has been chronic, mainly due to abdominal hernia in the epigastric area, but reports the chest pain felt a little bit odd today, which prompted her to be concerned about it. In the ED the patient had negative cardiac enzymes, but was found to be in atrial fibrillation with RVR, which is not known in her history. Her heart rhythm converted to normal sinus rhythm, rate controlled without any intervention. The patient was orthostatic with MAP in the mid 40s.  The hospitalist service was requested to admit the patient for further management of her low blood pressure and atypical chest pain. The patient had T-max in the Emergency Department of 99.6. The patient was found to have a low platelet count of 94,000. As well, her TSH level was low at 0.26.   PAST MEDICAL HISTORY: 1. Recent MSSA due to HeRO graft status post HeRO graft removal on 11/04/2011. 2. Status post temporary left femoral catheter placement on  11/05/2011, status post permanent left femoral catheter placement on 11/12/2011. 3. Endstage renal disease on hemodialysis Tuesday, Thursday, and Saturday.  4. Chronic obstructive pulmonary disease.  5. Cardiomyopathy with ejection fraction less than 25%.  6. Depression.  7. Obesity.  8. History of bilateral nephrectomies.  9. History of hemodialysis on Tuesday, Thursday, and Saturday.  10. History of tubular sclerosis.  11. Hypertension.  12. Seizure disorder.  13. Asthma.  14. History of hyperparathyroidism.  15. Obstructive sleep apnea.  16. Osteoarthritis.  17. Status post colectomy.   ALLERGIES: Iodine causes a rash.   SOCIAL HISTORY: No smoking, alcohol, or illicit drug use.   FAMILY HISTORY: Mother is deceased, died from car accident. Father has diabetes.   HOME MEDICATIONS: 1. Xanax 0.25 every 6 hours as needed.  2. Tylenol 325, 1 every 4 hours as needed.  3. Topamax 200 mg 2 times a day.  4. Sorbitol liquid 15 mL as needed.  5. Renvela 800 mg, 2? tablets 2 times a day with snacks. 6. Renvela 800 mg, 2 tablets 3 times a day with meals.  7. Rena-Vite 1 tablet daily.  8. Primidone 250 mg 2 times a day.  9. PhosLo 2 capsules 3 times a day.  10. Oxycodone 5 mg every 4 hours as needed.  11. Sublingual nitroglycerin 0.4 mg every five minutes as needed for chest pain.  12. Nepro liquid.  13. Lisinopril 5 mg oral daily.  14. Keppra 500 mg on hemodialysis days- Tuesdays, Thursdays, and Saturdays.  15. Keppra 1000 mg oral daily.  16. Flonase  nasal daily.  17. Cymbalta 60 mg daily.  18. Coreg 3.125 mg 2 times a day.  19. Colace 100 mg daily.  20. Claritin 10 mg daily.  21. Aspirin 81 mg daily.  22. Advair 250/50, 1 puff 2 times a day.   REVIEW OF SYSTEMS: The patient has complaints of fever, generalized weakness, and fatigue. EYES: Denies blurry vision, double vision, or pain. ENT: Denies tinnitus, ear pain, or hearing loss.  RESPIRATORY: Denies cough, wheezing,  hemoptysis, or dyspnea.  CARDIOVASCULAR: Denies any chest pain, edema, arrhythmia, or palpitations. GI: Denies nausea, vomiting, diarrhea, or abdominal pain. GU: The patient is anuric status post nephrectomies.  GU: Denies any sores or discharge. ENDO: Denies polydipsia, heat or cold intolerance. HEMATOLOGY: Denies any easy bruising or blood clots. MUSCULOSKELETAL: Denies any neck pain or shoulder pain. Has history of arthritis and pain all over.  NEUROLOGIC: Denies any dysarthria, tremors, vertigo, or ataxia. PSYCH: Denies any insomnia or schizophrenia.   PHYSICAL EXAMINATION:  VITAL SIGNS: Temperature 98.6, pulse 79, T-max 99.6 in the Emergency Department. Blood pressure 122/64, saturating 97% on room air.   GENERAL: Morbidly obese female looks comfortable in bed in no apparent distress.   HEENT: Head atraumatic, normocephalic. Pupils equal, reactive to light. Pink conjunctivae. Anicteric sclerae. Moist oral mucosa. Facial mucocutaneous lesions.   NECK: Supple. No thyromegaly. No JVD.   CHEST: Good air entry bilaterally. No wheezing, rales, or rhonchi.   CARDIOVASCULAR: S1, S2 heard. No rubs, murmur, or gallops.   ABDOMEN: Obese, soft, nontender, nondistended. Bowel sounds present. Midline hernia bulging upon coughing.    EXTREMITIES: No cyanosis. No clubbing. Has +1 to 2 edema. Pulses +2 bilaterally. Left lower extremity hemodialysis catheter, has bandage in the left upper extremity secondary to recent AV graft.   NEUROLOGIC: Awake, alert, oriented times three. Intact judgment and insight. Sensation is symmetric and intact.   SKIN: Moist and warm.   LYMPH:  No lymphadenopathy in the cervical or axillary areas.  PERTINENT LABORATORY DATA: BUN 44, creatinine 3.76, sodium 138, potassium 4.1, chloride 97, troponin 0.04. TSH 0.267. White blood cells 4.8, hemoglobin 11.3, hematocrit 32.8, platelets 97.   ASSESSMENT: This is a 59 year old female with history of tubular sclerosis, endstage  renal disease on dialysis Tuesdays, Thursdays, and Saturdays, presents with generalized body achiness and atypical chest pain, had fever at hemodialysis today, as well as a lone episode of atrial fibrillation with RVR that resolved spontaneously without any intervention.   PLAN:  1. Chest pain. This appears to be atypical, most likely related to her chronic abdominal hernia as well as related to her generalized body ache most likely from fever. We will cycle troponin and continue with aspirin.  2. Lone episode of atrial fibrillation, resolved spontaneously. Most likely related to her low-grade temperature. We will monitor on telemetry.  3. Fever. The patient has a low-grade temperature in the ED and had a fever at hemodialysis. With recent history of MSSA bacteremia, blood cultures were sent.  Peripherals were sent in the Emergency Department and from the dialysis catheter were sent at the hemodialysis center.  The patient was started on IV vancomycin. We will follow blood cultures.  4. Low TSH level. This is most likely sick euthyroid syndrome, but we will check FT4 level and if elevated may need to be started on treatment for hyperthyroidism given the fact she presented with new onset atrial fibrillation.  5. Thrombocytopenia. We will hold on chemical anticoagulation. We will continue with sequential compression devices for deep  vein thrombosis prophylaxis. We will check HIT antibody.  6. Endstage renal disease. We will consult nephrology service for hemodialysis Tuesday, Thursday, and Saturday.  7. History of seizures. We will continue home medication.  8. History of cardiomyopathy. The patient does not appear to be in acute congestive heart failure. We will continue on Coreg and lisinopril.  9. CODE STATUS: FULL CODE.       TOTAL TIME SPENT ON PATIENT CARE: 55 minutes.   ____________________________ Starleen Arms, MD dse:bjt D: 02/15/2012 01:44:14 ET T: 02/15/2012 08:43:02  ET JOB#: [id number]  cc: Starleen Arms, MD, <Dictator> Meindert A. Lacie Scotts, MD Karilynn Carranza Teena Irani MD ELECTRONICALLY SIGNED 02/15/2012 22:35

## 2014-11-02 NOTE — Discharge Summary (Signed)
PATIENT NAME:  [name], [name] MR#:  [id number] DATE OF BIRTH:  [birth year]  DATE OF ADMISSION:  05/24/2012 DATE OF DISCHARGE:  05/26/2012  ADMITTING DIAGNOSES: Chest pressure, shortness of breath.   DISCHARGE DIAGNOSES:  1. Chest pressure felt to be noncardiac status post evaluation by Dr. Lady Gary.  2. Recent diagnosis of complete heart block status post [employer] pacemaker at [facility].  3. Left-sided breast pain which has been chronic.  4. Recent diagnosis of subarachnoid hemorrhage.  5. Shortness of breath due to acute systolic congestive heart failure as well as volume overload, status post hemodialysis, will be getting one more treatment.  6. Hypertension.  7. End-stage renal disease.  8. Seizure disorder.  9. Bradycardia.  10. Hyperkalemia.  11. Asthma.  12. Secondary hyperparathyroidism.  13. Depression.  14. History of methicillin-resistant Staphylococcus aureus sepsis in April.   LABORATORY, DIAGNOSTIC AND RADIOLOGICAL DATA: Pertinent laboratory and evaluations: Admitting glucose 104, BUN 24, creatinine 4.50, sodium 129, potassium 4.4, chloride 93, CO2 26. LFTs: Total protein 8.0, albumin 2.8, bilirubin total 0.5, alkaline phosphatase 398, AST 28, ALT 13, CPK 90, CK-MB 2.8. Troponin less than 0.2. WBC 6.5, hemoglobin 8.8, INR 1.2. Chest x-ray showed cardiomegaly with bilateral infiltrates consistent with possible congestive heart failure. Troponin was less than 0.02 x3. TSH 3.26, vitamin B12 level was 1780.   HOSPITAL COURSE: Please refer to history and physical done by me on admission. Patient is a 59 year old Caucasian female who has had recurrent admissions to the hospital for multiple complaints who on presentation did not tell me that she was admitted at [facility], was transferred to [facility] for subarachnoid hemorrhage and was just discharged day prior. Apparently patient had fallen and was diagnosed with subarachnoid hemorrhage. She was kept at [facility] for a month and was sent to  rehab. Patient presented here with chest pain and shortness of breath. Chest x-ray suggested possible congestive heart failure, volume overload. However, she likely has some chronic fluid overload. She has been hemodialyzed regularly. She was admitted for further evaluation. She was seen in consultation by Dr. Lady Gary who was able to obtain her records from [facility] from recent hospitalization. At that time she had continued to complain of this left-sided chest pain and was seen by cardiology as well as a thoracic surgeon. Patient was felt to have chest pain felt to be secondary to her large panniculus breasts as well as healing right anterior thoracotomy. She was seen by Dr. Lady Gary who did not feel that this was cardiac. She had serial cardiac enzymes which were negative. In terms of her shortness of breath, she was seen by nephrology and has been dialyzed. Plan is for one more dialysis at this time.   DISCHARGE INSTRUCTIONS: Patient is to be weighed every day at the same time, preferably first thing in the morning after urinating and before eating. Call physician if greater than 2 pounds in one day or 5 pounds in one week, for increased weight gain, tiredness, swelling in legs or feet, shortness of breath, urinating less call 911.   DISCHARGE MEDICATIONS:  1. Keppra 1000 mg daily, 500 mg extra on hemodialysis days. 2. Advair 250/50, 1 puff b.i.d.  3. Protonix 40 daily.  4. Cymbalta 60 daily.  5. Flonase 2 sprays nasally daily.  6. Fexofenadine 180 daily.  7. Topamax 200, 1 tab p.o. b.i.d.  8. Primidone 250, 0.5 tab daily.  9. Aspirin 81, 1 tab p.o. daily.  10. Colace 100, 1 tab p.o. b.i.d.  11. Coreg  3.125, 1 tab p.o. b.i.d.  12. PhosLo 2 caps t.i.d. 13. Renvela 800 mg 2 tabs 3 times a day. 14. Lidoderm apply two patches topically to affected area every 12 hours.  15. Tramadol 50 q.6 p.r.n. pain. 16. Tylenol 650 q.6 p.r.n. pain.  17. Renvela 800, 1 tab p.o. at bedtime. 18. Lisinopril 2.5  daily.  19. Ammonium lactate 5% topical lotion apply b.i.d.  20. Bactroban 2% nasally daily.  21. Nystatin apply topically 2 times a day as needed.   HOME OXYGEN: Yes, 4 liters.   DIET: Low fat, low cholesterol renal diet. Diet consistency regular consistency.   ACTIVITY: As tolerated. She needs PT, OT.   FOLLOW UP: Follow up with Dr. Alphonsus Sias in 1 to 2 weeks. Hemodialysis as per nephrology.   TIME SPENT: 45 minutes spent.   ____________________________ Lacie Scotts. Allena Katz, MD shp:cms D: 05/26/2012 10:19:00 ET T: 05/26/2012 11:07:15 ET JOB#: [id number]  cc: Uriah Trueba H. Allena Katz, MD, <Dictator> Karie Schwalbe, MD  Charise Carwin MD ELECTRONICALLY SIGNED 05/29/2012 13:31

## 2014-11-02 NOTE — Discharge Summary (Signed)
PATIENT NAME:  [name], [name] MR#:  [id number] DATE OF BIRTH:  [birth year]  DATE OF ADMISSION:  06/13/2012 DATE OF DISCHARGE:  06/16/2012  PRIMARY CARE PHYSICIAN: Lorelee Market, MD   HISTORY OF PRESENT ILLNESS: Ms. Kobayashi is a 59 year old Caucasian female with history of tubular sclerosis status post bilateral nephrectomy, on hemodialysis, hypertension, arthritis, hemoglobin of 7.1, with complaint of chest pressure and weakness. Her baseline hemoglobin was 8.9 to 9, and so they decided to send her to the Emergency Room. In the ER, they also found her hypotensive and so they finally decided to admit the patient to the Medical Service.   HOSPITAL COURSE: Initially the patient on presentation had no chest x-ray findings suggestive of pneumonia. The patient was afebrile and white count was also normal, so it was decided not to start on any antibiotics and just monitor her for her borderline low blood pressure.   1. End-stage renal disease: Her end-stage renal disease was managed by hemodialysis in the hospital. She received one dialysis by removing 4.5 liters of fluid, and she felt a little better after that.  2. Anemia:  Anemia was acute on chronic onset secondary to her end-stage renal disease. One PRBC transfusion was done with having sufficient response to that, and she felt better and stronger after receiving the blood transfusion. GI consult was called in for anemia. Dr. Vira Agar saw the patient, but he decided not to do any GI workup at this point of time because of her multiple medical issues and her chronic anemia secondary to renal failure. We checked the iron studies, and the iron level was low; so we are discharging her on oral iron supplementation.   Other medical issues addressed during this hospital stay:  1. End-stage renal disease: Managed with Nephrology with hemodialysis. 2. Hypertension: She was borderline hypotensive during the hospital stay. Possibly this we can contribute to  her low ejection fraction of 25% and anemia and renal failure, multiple medical issues; so we held her medications for her congestive heart failure like ACE inhibitor and beta blocker, and she did well with the blood pressure maintained in low normal range.  3. Acute congestive heart failure: BNP was high on presentation, and she was feeling weak. After 4.5 liters of fluid were removed by hemodialysis, she felt better and she was saturating 95% on room air without any oxygen supplementation. We will hold ACE inhibitor and beta blocker on discharge due to the renal failure and borderline low blood pressure.  4. Atrial fibrillation: We are not giving rate-controlling drugs at this time as her heart rate was remaining in acceptable range during the hospital stay, and her blood pressure was in lower normal range so she is not a candidate for beta blocker or calcium channel blocker at this point of time. We will have to reconsider starting her on rate-controlling medication if blood pressure comes up.  DISCHARGE DIAGNOSES:  1. Fluid overload. 2. Systolic heart failure.  3. Acute on chronic anemia.  4. End stage renal disease on hemodialysis.   CODE STATUS:  FULL CODE.     DISCHARGE MEDICATIONS: Please see the discharge instructions given to the patient.   HOSPITAL CONSULTATIONS:  1. Dr. Gaylyn Cheers.  2. Dr. Murlean Iba.   IMPORTANT HOSPITAL LABORATORY, DIAGNOSTIC AND RADIOLOGICAL DATA: BNP level 24,928. Troponin level less than 0.02. Hemoglobin level came up to 8.6, iron 33, folic acid 6.4, iron binding capacity 168.0. Blood culture remained negative. Chest x-ray: Findings are consistent with congestive heart  failure with pulmonary interstitial edema and left pleural effusion. Appearance of lung improved somewhat since the previous study, but significant abnormality remains.   total time spent- 45 min. ____________________________ Ceasar Lund. Anselm Jungling, MD vgv:cbb D: 06/16/2012 16:28:53  ET T: 06/16/2012 17:08:46 ET JOB#: [id number]  cc: Ceasar Lund. Anselm Jungling, MD, <Dictator> Meindert A. Brunetta Genera, MD Vaughan Basta MD ELECTRONICALLY SIGNED 06/30/2012 22:55

## 2014-11-02 NOTE — Consult Note (Signed)
CC: anemia.  Pt on hemodialysis, complains of feeling washed out after treatent.  She is requesting a walker and PT consult to help with ambulation.  I will mention this and have her primary doctor to order this.  Pt anemia labs showed low iron and iron binding with normal folate.  These are indiciative of anemia of chronic disease.   Pt refused DVT stockings and devices and refused to abide by fluid restriction per nurses note.No plans for any GI studies.  Electronic Signatures: Manya Silvas (MD)  (Signed on 01-Dec-13 09:15)  Authored  Last Updated: 01-Dec-13 09:15 by Manya Silvas (MD)

## 2014-11-02 NOTE — H&P (Signed)
PATIENT NAME:  [name], [name] MR#:  [id number] DATE OF BIRTH:  01-22-1956  DATE OF ADMISSION:  04/01/2012  ADDENDUM:  ALLERGIES: The patient has allergy to contrast iodine and Medrol.   HOME MEDICATIONS:  1. Xanax 0.25 mg p.o. q.6 hours p.r.n.  2. Topamax 200 mg p.o. b.i.d. 3. Sorbitol 70% oral liquid 30 mL once a day p.r.n. for constipation.  4. Renvela 800 mg p.o. 2 tablets t.i.d. with meals.  5. Primidone 250 mg p.o. once daily.  6. PhosLo 667 mg 2 caps t.i.d.  7. Omeprazole 20 mg b.i.d.  8. Nepro Liquid one can b.i.d.  9. Lisinopril 5 mg p.o. daily.  10. Keppra 500 mg p.o. 1 tablet once daily.  11. Keppra 1000 mg tablets 1 tablet p.o. daily.  12. Flonase 50 mcg inhalation two sprays once a day.  13. EMLA 2.5% apply topically to affected area.  14. Cymbalta 60 mg p.o. once daily.  15. Coreg 3.125 mg p.o. 1 tablet b.i.d.  16. Colace 100 mg p.o. b.i.d.  17. Bactroban 2% nasal one application nasal 3 times a day.  18. Aspirin 81 mg p.o. daily.  19. Advair 250 mcg/500 mcg one puff b.i.d.   ____________________________ Demetrios Loll, MD qc:drc D: 04/01/2012 12:15:02 ET T: 04/01/2012 12:27:03 ET JOB#: [id number]  cc: Demetrios Loll, MD, <Dictator> Demetrios Loll MD ELECTRONICALLY SIGNED 04/03/2012 14:53

## 2014-11-02 NOTE — Consult Note (Signed)
Chief Complaint:   Subjective/Chief Complaint Pt states to feel much better today. She still has some mild cp but her bradycardia is improved.   VITAL SIGNS/ANCILLARY NOTES: **Vital Signs.:   18-Sep-13 15:33   Temperature Temperature (F) 98.4   Celsius 36.8   Temperature Source oral   Pulse Pulse 64   Respirations Respirations 15   Systolic BP Systolic BP 98   Diastolic BP (mmHg) Diastolic BP (mmHg) 47   Mean BP 64   Pulse Ox % Pulse Ox % 96   Oxygen Delivery 2L; Nasal Cannula   Pulse Ox Heart Rate 66  *Intake and Output.:   Daily 18-Sep-13 07:00   Grand Totals Intake:  366 Output:  4750    Net:  -4384 24 Hr.:  -4384   Oral Intake      In:  150   IV (Primary)      In:  216   Urine ml     Out:  750   Dialysis Fluid Removed (ml) ml     Out:  4000   Length of Stay Totals Intake:  366 Output:  4750    Net:  -4384   Brief Assessment:   Cardiac Regular  murmur present  + carotid bruits  + LE edema  --Gallop    Respiratory normal resp effort  clear BS    Gastrointestinal Normal    Gastrointestinal details normal Soft  Nontender  Nondistended   Lab Results: Routine Chem:  18-Sep-13 00:46    Glucose, Serum 82   BUN  68   Creatinine (comp)  5.12   Sodium, Serum 137   Potassium, Serum 4.4   Chloride, Serum  95   CO2, Serum 31   Calcium (Total), Serum 8.6   Anion Gap 11   Osmolality (calc) 293   eGFR (African American)  10   eGFR (Non-African American)  9 (eGFR values <47m/min/1.73 m2 may be an indication of chronic kidney disease (CKD). Calculated eGFR is useful in patients with stable renal function. The eGFR calculation will not be reliable in acutely ill patients when serum creatinine is changing rapidly. It is not useful in  patients on dialysis. The eGFR calculation may not be applicable to patients at the low and high extremes of body sizes, pregnant women, and vegetarians.)   Magnesium, Serum  2.5 (1.8-2.4 THERAPEUTIC RANGE: 4-7 mg/dL TOXIC: > 10  mg/dL  -----------------------)   Cholesterol, Serum 128   Triglycerides, Serum 76   HDL (INHOUSE)  69   VLDL Cholesterol Calculated 15   LDL Cholesterol Calculated 44 (Result(s) reported on 02 Apr 2012 at 01:36AM.)  Routine Hem:  18-Sep-13 00:46    WBC (CBC) 4.2   RBC (CBC)  2.82   Hemoglobin (CBC)  9.4   Hematocrit (CBC)  28.8   Platelet Count (CBC)  135   MCV  102   MCH 33.3   MCHC 32.5   RDW  15.8   Neutrophil % 68.3   Lymphocyte % 21.6   Monocyte % 7.4   Eosinophil % 2.1   Basophil % 0.6   Neutrophil # 2.8   Lymphocyte #  0.9   Monocyte # 0.3   Eosinophil # 0.1   Basophil # 0.0 (Result(s) reported on 02 Apr 2012 at 01:30AM.)   Radiology Results: XRay:    17-Sep-13 09:26, Chest Portable Single View   Chest Portable Single View    REASON FOR EXAM:    Chest Pain  COMMENTS:  PROCEDURE: DXR - DXR PORTABLE CHEST SINGLE VIEW  - Apr 01 2012  9:26AM     RESULT: Comparison: 02/27/2012    Findings:  Cardiomegaly and the mediastinum are similar to prior. Prominence of   bilateral hila are similar to prior likely secondary to enlarged   pulmonary vasculature. There bilateral heterogeneous opacities but the   secondary to pulmonary edema.    IMPRESSION:   Findings which likely represent pulmonary edema.  Dictation site: 2          Verified By: Gregor Hams, M.D., MD   Assessment/Plan:  Invasive Device Daily Assessment of Necessity:   Does the patient currently have any of the following indwelling devices? none   Assessment/Plan:   Assessment IMP Bradycardia Hyperkalemia ESRD Uremia HTN Hx Obesity Abn Ekg Hypotension Anemia Cardiomyopathy .    Plan PLAN ICU care Dialysis for HyperK/Uremia Mild fluid hydration for Hypotension Pressor as necessary for Bp I do not rec Temp or PPM ROMI by enzymes No clear indication for transfusion Rec sleep study for OSA Conservative cardiac therapy for now   Electronic Signatures: Lujean Amel D  (MD)  (Signed 18-Sep-13 18:05)  Authored: Chief Complaint, VITAL SIGNS/ANCILLARY NOTES, Brief Assessment, Lab Results, Radiology Results, Assessment/Plan   Last Updated: 18-Sep-13 18:05 by Yolonda Kida (MD)

## 2014-11-02 NOTE — Consult Note (Signed)
Consult done, pt had rectal bleeding very likely internal hemorrhoids, had previous colonoscopy 3 years ago at [facility].  Had EGD done 18-20 years ago with finding of non bleeding ulcer treated with Nexium.  No endoscopic plans at this time.  Will give Anusol HC supp every night, continue omeprazole and increase dose to 40mg  po bid.    Electronic Signatures: Manya Silvas (MD)  (Signed on 30-Nov-13 16:15)  Authored  Last Updated: [id number] 16:15 by Manya Silvas (MD)

## 2014-11-02 NOTE — Discharge Summary (Signed)
PATIENT NAME:  [name], [name] MR#:  [id number] DATE OF BIRTH:  11-10-1955  DATE OF ADMISSION:  04/01/2012 DATE OF DISCHARGE:  04/04/2012  Please review history and physical by Dr. Imogene Burn.    PRESENTING COMPLAINT: Chest pain and fainting spell.  DISCHARGE DIAGNOSES: 1. Symptomatic bradycardia secondary to hyperkalemia, resolved after hemodialysis.  2. End-stage renal disease on hemodialysis.  3. Chest pain. Ruled out for acute myocardial infarction.  4. Chronic congestive heart failure, systolic, ejection fraction 25%, stable.  5. Seizure disorder.  6. Morbid obesity.  7. Relative hypotension, could be secondary to cardiomyopathy, improved.  8. Anemia of chronic disease.  9. Thrombocytopenia, chronic.  10. History of asthma.   CODE STATUS: NO CODE, DO NOT RESUSCITATE.   CONSULTANTS:  1. Nephrology, Dr. Cherylann Ratel.  2. Palliative care, Dr. Harvie Junior.  3. Cardiology, Dr. Juliann Pares.   LABORATORY, DIAGNOSTIC AND RADIOLOGICAL DATA: Labs at discharge: White count 2.8, hemoglobin and hematocrit 9.8 and 29.2, platelet count 101, glucose 83, BUN 71, creatinine 5.87, potassium 4.7. Phosphorus 5. PTH 269. Magnesium 2.5. Lipid profile within normal limits.   Cardiac enzymes including troponin x3 is negative.   Chest x-ray consistent with pulmonary edema.   Potassium 6.8 at admission.   INSTRUCTIONS:  1. Renal diet. 2. Oxygen 2 liters per minute to keep saturations greater than 92%.  3. PT as tolerated.  4. CODE STATUS: NO CODE, DO NOT RESUSCITATE.   MEDICATIONS AT DISCHARGE:  1. Tylenol 650 q.4 p.r.n.  2. Protonix 40 mg daily.  3. Aspirin 81 mg daily.  4. Cymbalta 60 mg daily.  5. Keppra 1000 mg daily.  6. Primidone 250 mg p.o. daily.  7. Topamax 200 mg b.i.d.  8. Xanax 0.25 q.6 p.r.n.  9. Fexofenadine 180 mg p.o. daily.  10. Advair 250/50, 1 puff b.i.d.  11. Colace 100 mg b.i.d.  12. Mupirocin 2% nasal ointment one application b.i.d.  13. Flonase two sprays both nostrils daily.   14. Sorbitol 70% solution 30 mL p.o. daily p.r.n.  15. Keppra 500 mg every Tuesday, Thursday, and Saturday that is an extra dose. Give after dialysis on dialysis days.  16. Renvela 1600 mg p.o. t.i.d. with meals.  17. Calcium acetate 2 capsules oral t.i.d.  18. Lisinopril 2.5 mg p.o. at bedtime.   BRIEF SUMMARY OF HOSPITAL COURSE: [name] is a 59 year old female with multiple medical problems comes in to the Emergency Room, was admitted with:  1. Bradycardia, symptomatic with presyncopal episode suspected due to hyperkalemia. She had potassium of 6.8 at admission. She underwent urgent hemodialysis. Potassium is down to 4.7. Cardiology was on board. Cardiac enzymes were negative for acute myocardial infarction. She does not need a pacemaker. Her heart rate is in the upper 60s.  2. Hypotension, stable. Could be secondary to cardiomyopathy with severe bradycardia, improved.  3. Hypokalemia due to end-stage renal disease, resolved after hemodialysis.  4. Seizure disorder. Patient was continued on Topamax and Keppra. She remained seizure-free.  5. Hypertension, now hypotensive. Her blood pressure medications were held, however, her lisinopril low dose was resumed at bedtime.  6. Chronic congestive heart failure, systolic dysfunction, ejection fraction around 25%, remained stable. Patient was resumed back on lisinopril. Blood pressure was monitored closely. Her Coreg was discontinued at this time due to low blood pressure.  7. Anemia of chronic disease. Stable.  8. Chronic thrombocytopenia. She has had episode as low at 85,000, likely is at baseline. No active bleeding.  9. History of asthma, much better.  10. Patient  was seen by physical therapy. Patient will be discharged back to [facility] with continued hemodialysis Tuesday, Thursday, Saturday according to her schedule.  11. Patient was also seen by palliative care, Dr. Harvie Junior, in the hospital given her multiple comorbidities. She remained  a NO CODE, DO NOT RESUSCITATE while in house. Hospital stay otherwise remained stable.   TIME SPENT: 40 minutes.  ____________________________ Wylie Hail Allena Katz, MD sap:cms D: 04/04/2012 12:27:39 ET T: 04/04/2012 12:47:44 ET JOB#: [id number]  cc: Govind Furey A. Allena Katz, MD, <Dictator> Willow Ora MD ELECTRONICALLY SIGNED 04/08/2012 16:10

## 2014-11-02 NOTE — Consult Note (Signed)
Brief Consult Note: Diagnosis: CP, atypical, neg troponin, low probability for underlying CAD or ACS.   Patient was seen by consultant.   Consult note dictated.   Comments: REC  Agree with current therapy and plan, defer full dose anticoagulation, defer further cardiac diagnostics at this time, could consider further w/u of non-cardiac source of CP which could be done as out-patient.  Electronic Signatures: Isaias Cowman (MD)  (Signed 02-Dec-13 21:34)  Authored: Brief Consult Note   Last Updated: 02-Dec-13 21:34 by Isaias Cowman (MD)

## 2014-11-02 NOTE — Discharge Summary (Signed)
PATIENT NAME:  [name], Stone MR#:  [id number] DATE OF BIRTH:  Mar 16, 1956  DATE OF ADMISSION:  02/27/2012 DATE OF DISCHARGE:  03/01/2012  DISPOSITION: [facility].   CONSULTATIONS:  1. Cardiology consults with Dr. Rockey Situ and Dr. Fletcher Anon.  2. Nephrology consult with Dr. Candiss Norse.   DISCHARGE DIAGNOSES:  1. Collapse secondary to severe bradycardia.  2. Bradycardia due to hyperkalemia, hyperphosphatemia, resolved. 3. End-stage renal disease, missed hemodialysis causing hyperkalemia causing bradycardia, but the patient received emergency dialysis, now bradycardia, resolved.  4. Seizure disorder.  5. Morbid obesity.  6. Hypertension.  7. Chronic systolic heart failure.   DISCHARGE MEDICATIONS:  1. Topamax 200 mg p.o. b.i.d.  2. Flonase 50 mcg inhalation 2 sprays daily.  3. Advair Diskus 250/50, 1 puff b.i.d.  4. Cymbalta 60 mg daily. 5. Lisinopril 5 mg daily. 6. Coreg 3.125 mg p.o. b.i.d.  7. Aspirin 81 mg daily.  8. PhosLo 667 mg, 2 capsules t.i.d.  9. Renvela 800 mg, 2 tablets p.o. t.i.d. with meals.  10. Xanax 0.25 mg every 6 hours p.r.n. as needed.  11. Nepro liquid, 1 can b.i.d.  12. Colace 100 mg p.o. b.i.d.  13. Primidone 250 mg in the morning and 1/2 tablet at night.  14. Allegra-24 Hour Relief, 1 tablet daily.  15. Sorbitol 30 mL as needed for constipation.  16. Omeprazole 20 mg p.o. b.i.d.  17. Keppra 1 gram p.o. daily.  18. Keppra 500 mg after each dialysis in addition to 1 gram.  19. Bactroban ointment to the left hand impetigo.   NOTE: Stop oxycodone every 12 hours.    DIET: Renal, regular consistency.  HOSPITAL COURSE: The patient is a 59 year old female patient with multiple medical problems of hypertension, end-stage renal disease, and systolic heart failure with ejection fraction of 25%, came in because she missed dialysis the day before on 08/13, and she had a PermCath  problem, and she came to fix it by Dr. Delana Meyer. While waiting for that, she collapsed in  the [facility], was found to have a heart rate of 10; then the patient was brought to the Emergency Room for unresponsiveness and collapse, found to have severe bradycardia. She immediately received calcium gluconate per ER physician. EKG showed third degree heart block, and the patient is admitted to the Intensive Care Unit, started on a dopamine drip, and calcium gluconate was given, and she was seen by Dr. Rockey Situ from Cardiology. The patient did not have any elevation of troponins. The patient's hyperkalemia with missed hemodialysis was thought to be the secondary reason for heart bradycardia. The patient's EKG and heart rate nicely improved after calcium gluconate, and heart rate improved around 54 with RVR. The patient was monitored in the Intensive Care Unit and started on dopamine because blood pressure was also low, and dopamine was continued for one day. The patient received emergency dialysis the same day.  Dr. Candiss Norse used her left arm AV fistula, and the patient's potassium improved. Her heart rate stayed stable with the above regimen, and she was moved to telemetry, and heart rate went up to around 90s, so Dr. Fletcher Anon started her back on Coreg 3.125 b.i.d.;  and  echocardiogram showed LV function is 25 to 35%, which is slightly better than before, and she was also started on lisinopril for her chronic congestive heart failure. So her left groin PermCath needs to be more prevascular. The plan is to see if she can go to [facility] after hemodialysis today.  The patient had a Foley placed in the Emergency Room and immediately removed, but that caused some trauma; and the patient had some bleeding around that area which I examined, and it is not vaginal bleeding, and the area looks slightly erythematous.  So, I told her she can use some diaper rash cream.   For seizure disorder, she is on Keppra 1 gram IV daily and 500 mg at each dialysis. She did not have any seizures here, and she can continue the  same dose.    HOSPITAL LABORATORY, DIAGNOSTIC AND RADIOLOGICAL DATA: The patient's EKG showed on atrial fibrillation with slow ventricular response. On admission, Keppra level was 32.0.  Phosphorus was 5.4, magnesium 3.2 on admission, INR 1.0. Blood cultures no growth. The patient's WBC was 6.6, hemoglobin 11.1, hematocrit 33.9, platelets 184. Electrolytes: Sodium 130, potassium 5.8, chloride 95, bicarbonate 23, BUN is 104, creatinine 7.94.  This is on August 14th. Chest x-ray showed stable cardiomegaly. No infiltrates. Troponins were negative, 0.05, 0.03. The patient's sodium improved to 132, potassium also 4.7. Echocardiogram, as I mentioned, showed ejection fraction around 25 to 29% with systolic function moderately to severely decreased. Repeat EKG which was done on the 15th showed sinus rhythm, first-degree AV block at 74 beats per minute.   DISCHARGE VITAL SIGNS:  The patient's vitals this morning are temperature 97.7, pulse 68, respirations 18, and blood pressure 129/72, saturations 97% on room air.   I am going to see her in dialysis, and if she feels good we will discharge her to [facility].   TIME SPENT ON DISCHARGE PREPARATION: More  than 30 minutes.   ____________________________ Epifanio Lesches, MD sk:cbb D: 03/01/2012 08:35:47 ET T: 03/01/2012 13:55:18 ET JOB#: [id number]  cc: Epifanio Lesches, MD, <Dictator> Meindert A. Brunetta Genera, MD Epifanio Lesches MD ELECTRONICALLY SIGNED 03/19/2012 10:18

## 2014-11-02 NOTE — Discharge Summary (Signed)
PATIENT NAME:  [name], [name] MR#:  [id number] DATE OF BIRTH:  April 30, 1956  DATE OF ADMISSION:  06/13/2012 DATE OF DISCHARGE:  06/17/2012  ADDENDUM: This is an addendum to discharge summary of 06/16/2012. The patient was supposed to get discharged on 06/16/2012, but she complained of chest pain while in the lobby with EMS and they took her to the ER and after discussion with the ER physician and floor nurse they placed her back in the room so she never left the hospital. Her troponin level was checked, which was normal, EKG was done which was same as in the past, and Cardiology consult was called in. The cardiologist denied any further cardiac work-up and labeled it as noncardiac chest pain. We further discussed with the nephrologist, gastroenterologist, and the team there is no other work-up pending for her at this time and she can be medically stable for discharge. So today she is being discharged to a nursing home. We recommend to continue the same medication as she was instructed yesterday in discharge instructions.   TOTAL TIME SPENT IN DISCHARGE ADDENDUM: 35 minutes.  ____________________________ Ceasar Lund Anselm Jungling, MD vgv:slb D: 06/17/2012 14:06:00 ET T: 06/17/2012 14:33:47 ET JOB#: [id number]  cc: Ceasar Lund. Anselm Jungling, MD, <Dictator> Vaughan Basta MD ELECTRONICALLY SIGNED 06/30/2012 22:55

## 2014-11-02 NOTE — Consult Note (Signed)
General Aspect patient is a 59 year old female with history of end-stage renal disease on hemodialysis and history of a recent subarachnoid hemorrhage secondary to a mechanical fall who was recently admitted to [facility] for a prolonged period from October 12 through May 23, 2012. During that admission she was treated for the subarachnoid hemorrhage as well as for complete heart block. She received an epicardial pacemaker secondary to inability to place a transvenous pacemaker due to venous insufficiency. She has a [employer] pacemaker with a single lead to the epicardium. She also complains of chronic left breast pain which is been treated with lidocaine patch. She also has a history of seizure disorder. During her hospitalization at [facility] she underwent evaluation by electrophysiology as well as thoracic surgery. 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 Echocardiogram during the admission revealed an ejection fraction of 35% with mild mitral regurgitation. Her heart rate heart block was felt to be secondary to hyperkalemia. Patient also has organic sleep apnea. Patient's head CT and workup at [facility] revealed a stable subarachnoid hemorrhage. She had a sacral ulcer which was treated with protective ointment. Her breast pain was felt to be secondary to her large pendulous breasts and a healing right anterior thoracotomy. She was discharged on November 8 to rehabilitation facility and was readmitted to [facility] with complaints of breast pain. She is currently hemodynamically stable but a very poor historian. She does not appear to have had an acute ischemic event. She is not in congestive heart failure at present.   Physical Exam:   GEN obese    HEENT PERRL, hearing intact to voice    NECK No masses    RESP no use of accessory muscles  rhonchi  very distant breath sounds secondary to body  habitus    CARD Regular rate and rhythm  very distant heart sounds secondary to body habitus    ABD denies tenderness  morbidly obese    EXTR negative cyanosis/clubbing, negative edema    SKIN normal to palpation    NEURO cranial nerves intact    PSYCH alert   Review of Systems:   Subjective/Chief Complaint left breast pain    General: No Complaints    Skin: No Complaints    ENT: No Complaints    Eyes: No Complaints    Neck: No Complaints    Respiratory: No Complaints    Cardiovascular: Chest pain or discomfort    Gastrointestinal: No Complaints    Genitourinary: No Complaints    Vascular: No Complaints    Musculoskeletal: No Complaints    Neurologic: No Complaints    Hematologic: No Complaints    Endocrine: No Complaints    Psychiatric: No Complaints    Review of Systems: All other systems were reviewed and found to be negative    Medications/Allergies Reviewed Medications/Allergies reviewed     Dialysis:    CHF:    COPD:    Anemia:    Renal Failure:    MRSA within past 6 months: Apr 2013   Hernia:    seizures:   Home Medications: Medication Instructions Status  Keppra 500 mg oral tablet 1 tab(s) orally once a day post-dialysis on Tuesday, Thursday, and Saturday in addition to Keppra 1000 mg Active  Keppra 1000 mg oral tablet 1 tab(s) orally once a day on Monday, Wednesday, Friday, and Sunday, and 1 tab along with 500mg  tab to equal 1500mg  total after dialysis on  Tuesday, Thursday, and Saturday for seizures. *note dose* Active  Advair Diskus 250 mcg-50 mcg inhalation powder 1 puff(s) inhaled 2 times a day Active  pantoprazole 40 mg oral delayed release tablet 1 tab(s) orally once a day Active  Cymbalta 60 mg oral delayed release capsule 1 cap(s) orally once a day Active  Flonase 50 mcg/inh nasal spray 2 spray(s) nasal once a day Active  fexofenadine 180 mg oral tablet 1 tab(s) orally once a day Active  Topamax 200 mg oral tablet 1 tab(s)  orally 2 times a day Active  primidone 250 mg oral tablet 0.5 tab(s) orally once a day Active  Renvela 800 mg oral tablet 1 tab(s) orally once a day (at bedtime) Active  aspirin 81 mg oral tablet, chewable 1 tab(s) orally once a day Active  Colace 100 mg oral capsule 1 cap(s) orally 2 times a day Active  Coreg 3.125 mg oral tablet 1 tab(s) orally 2 times a day Active  PhosLo Gelcap 667 mg oral capsule 2 cap(s) orally 3 times a day (with meals) Active  Renvela 800 mg oral tablet 2 tab(s) orally 3 times a day (with meals) Active  Lidoderm 5% topical film Apply two topically to affected area every 12hrs Active  tramadol 50 mg oral tablet 1 tab(s) orally every 6 hours, As Needed- for Pain  Active  Tylenol 325 mg oral tablet 2 tab(s) orally every 6 hours, As Needed- for Pain  Active   EKG:   EKG NSR    Abnormal NSSTTW changes    Iodine: Hives  Medrol: Hives  Contrast - Iodinated Radiocontrast Dye: Hives  Trazodone: Unknown    Impression patient is a 59 year old female with history of seizure disorder status post bilateral 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 She also had high-grade heart block transiently which was treated with a permanent pacemaker with an epicardial lead system secondary to venous insufficiency precluding transvenous approach. She had a pleural chest tube placed and had a large amount of serosanguineous fluid. Her subarachnoid hemorrhage was felt to be stable and she was discharged. Breast pain was felt to be secondary to her thoracotomy as well as a large pendulous breasts. She is ruled out for myocardial infarction. She is hemodynamically stable at present.    Plan 1. Continue with hemodialysis as you're doing 2. Continue to treat her breast pain with Lidoderm  patch as you're doing 3. Treat sacral decubiti with air cushion and protective ointment 4. Chronic macrocytic anemia consistent with anemia of chronic disease. B12 and folate workup at [facility] were negative 5. Seizure disorder-continue to treat with Keppra as you're doing 6. Organic sleep apnea. Remain on room air with consideration for CPAP if there is evidence of significant apneic episodes 7. Continue to monitor on telemetry A. We'll follow with you   Electronic Signatures: Teodoro Spray (MD)  (Signed [phone] 15:30)  Authored: General Aspect/Present Illness, History and Physical Exam, Review of System, Past Medical History, Home Medications, EKG , Allergies, Impression/Plan   Last Updated: 10-Nov-13 15:30 by Teodoro Spray (MD)

## 2014-11-02 NOTE — H&P (Signed)
PATIENT NAME:  [name], OUK MR#:  [id number] DATE OF BIRTH:  Dec 20, 1955  DATE OF ADMISSION:  04/01/2012  PRIMARY CARE PHYSICIAN:  Dr. Brunetta Genera REFERRING PHYSICIAN:  Dr. Thomasene Lot  CHIEF COMPLAINT: Chest pain and near fainting.   HISTORY OF PRESENT ILLNESS: The patient is a 59 year old female with a history of bradycardia, hyperkalemia, ESRD, seizure disorder, hypertension, asthma, and obesity presented to the ED with chest pain and near fainting today. The patient is alert, awake, and oriented. According to her, she had one episode of seizure yesterday morning and started with chest pain last night, which is all over the chest, constant, 8 to 10/10 without radiation. In addition, she has and mild shortness of breath with cough. She also has a headache and dizziness. She was noted to have a slow heart rate at 20-60 and potassium is high at 6.8. The patient was treated with atropine 0.5 mg 3 times, treated with bicarbonate, insulin D50, and calcium but the heart rate is still in the 30s. Dr. Clayborn Bigness saw the patient and suggested that the bradycardia is due to hyperkalemia and we need to treat her hyperkalemia Dr. Holley Raring just came here to see the patient and suggested the patient needed another dose of calcium and will get emergent dialysis in the Critical Care Unit. The patient comes from a nursing home. She had similar symptoms and diagnosis in August. She was discharged on 08/17 due to hyperkalemia and bradycardia.   PAST MEDICAL HISTORY: As mentioned above:  1. Hypertension. 2. Endstage renal disease. 3. Congestive heart failure with ejection fraction 25%. 4. Seizure disorder.  5. Bradycardia.  6. Hyperkalemia.  7. Asthma. 8. Secondary hyperparathyroidism. 9. Depression.  10. Patient also has MRSA sepsis history in April.   SOCIAL HISTORY: No smoking, no alcohol drinking or illicit drugs. She lives in the nursing home [facility].  REVIEW OF SYSTEMS:  CONSTITUTIONAL: The patient  denies any fever or chills but has a headache, dizziness, and generalized weakness. EYES: No double vision or blurred vision. ENT: No epistaxis, postnasal drip, slurred speech, or dysphagia. RESPIRATORY: Positive for cough, sputum, and shortness of breath, but no hemoptysis. CARDIOVASCULAR: Positive for chest pain but no palpitations, orthopnea, or nocturnal dyspnea. No leg edema. GI:  No abdominal pain, but has nausea and vomiting. No diarrhea. No melena or bloody stool.  GU: The patient has no urine due to ESRD. ENDOCRINE: No heat or cold intolerance. HEMATOLOGY: No easy bruising or bleeding. NEUROLOGY:  Positive for seizure, loss of consciousness, and fainting.   PHYSICAL EXAMINATION:  VITAL SIGNS: Blood pressure 120/54, pulse 24, respirations 12, oxygen saturation 98% on room air.   GENERAL: The patient is alert, awake, oriented, in no acute distress.   HEENT: Pupils round, equal, reactive to light and accommodation. Moist oral mucosa. Clear oropharynx.   NECK: Supple. No JVD or carotid bruits. No lymphadenopathy. No thyromegaly.   CARDIOVASCULAR: S1, S2, severe bradycardia. No murmurs or gallops.   PULMONARY: Bilateral air entry. No wheezing or rales. No use of accessory muscles to breathe.   ABDOMEN: Obese, soft, weak bowel sounds. No obvious organomegaly. No tenderness or distention.   EXTREMITIES: No edema, clubbing, or cyanosis. No calf tenderness. Bilateral pedal pulses.    SKIN: No rash or jaundice.   NEUROLOGIC: Alert and oriented times three. No focal deficit. Power 5/5. Sensation intact.  LABORATORY, DIAGNOSTIC, AND RADIOLOGICAL DATA:  Chest x-ray showed pulmonary edema. Troponin 0.04, CK 55, CK-MB 1.1, INR 1. WBC 8.4, hemoglobin 12.9, platelets  182, glucose 168, BUN 11, creatinine 7.36, sodium 134, potassium 6.8, chloride 97, bicarbonate 21, calcium 8.9. EKG shows severe bradycardia at 23 beats per minute.   IMPRESSION:  1. Severe bradycardia possibly due to hyperkalemia.   2. Hyperkalemia due to ESRD.  3. Pulmonary edema.  4. Endstage renal disease.  5. Chest pain possibly to bradycardia and hyperkalemia. 6. Seizure disorder.  7. Morbid obesity.  8. Hypertension, controlled.  9. Congestive heart failure, systolic dysfunction, ejection fraction 25%.  10. History of asthma.   PLAN OF TREATMENT:  1. The patient will be admitted to the Critical Care Unit. We will closely monitor vital signs. We will get emergent dialysis in the Critical Care Unit and follow up BMP after dialysis. Follow up with Dr. Holley Raring and Dr. Clayborn Bigness.  2. Discontinue Coreg and lisinopril due to bradycardia and hyperkalemia.  3. Continue other home medications. 4. Discussed the patient's situation and the plan of treatment with the patient. Also discussed the patient's CODE STATUS. She wants DO NOT RESUSCITATE.   TIME SPENT: About 70 minutes.   ____________________________ Demetrios Loll, MD qc:bjt D: 04/01/2012 12:15:25 ET T: 04/01/2012 13:00:08 ET JOB#: [id number]  cc: Demetrios Loll, MD, <Dictator> Meindert A. Brunetta Genera, MD Demetrios Loll MD ELECTRONICALLY SIGNED 04/03/2012 14:55

## 2014-11-02 NOTE — Consult Note (Signed)
PATIENT NAME:  [name], [name] MR#:  [id number] DATE OF BIRTH:  1956/07/09  DATE OF CONSULTATION:  06/14/2012  CONSULTING PHYSICIAN:  Manya Silvas, MD  REASON FOR CONSULTATION: The patient is a 59 year old female with a history of anemia, congestive heart failure, seizure disorder, hypertension, end-stage renal disease on dialysis, morbid obesity and rectal bleeding. I was asked to see her in consultation.   HISTORY: The patient has been in and out of the hospital here. She was admitted 11/02/2011 for an 11-day stay with Staphylococcal sepsis and septic shock and pulmonary edema with congestive heart failure. She was admitted for a few days on 02/27/2012. She was recently admitted with acute on chronic anemia, symptomatic.  One unit of packed red cells was given. She feels a little better after that but not a great deal better. She was noted to have hypotension. Normal baseline hemoglobin is about 9.0.  She was admitted because hemoglobin was 7 with hypotension and dyspnea.   The patient has had colonoscopy. The last was in 2010 at [facility]. It was negative except she does have hemorrhoids. The hemorrhoids sometimes bleed, they have been bleeding lately. She notes blood in the stool and on the paper. She had an upper endoscopy somewhere around the mid 1990s and was told she had an ulcer and she was put on Nexium. She also tries to eat a soft diet with no acid. She has been on  dialysis for six years.   REVIEW OF SYSTEMS: She denies dysphagia. She does have constipation. She takes sorbitol, one medicine cupful twice a day, and it keeps her bowels moving. Her appetite is good. She is very obese. She does complain of lower abdominal pain that comes after eating, and the pain is relieved by bowel movements.   ALLERGIES: Contrast iodine, Medrol and trazodone.   PAST MEDICAL/SURGICAL HISTORY: Other medical problems include: 1. Seizure disorder.  2. Chronic obstructive pulmonary disease.   3. End-stage renal disease.  4. Cardiomyopathy with a low ejection fraction.  5. Depression.  6. Constipation.  7. Bilateral nephrectomies.  8. Tubular sclerosis.  9. Hypertension.  10. Asthma.  11. Hyperparathyroidism.  12. Obstructive sleep apnea.  13. Status post gallbladder removal.   PHYSICAL EXAMINATION:  GENERAL: White female who looks older than stated age, comfortably eating supper.   HEENT: Sclerae are anicteric. Conjunctivae are negative. Tongue negative. Head is atraumatic.   CHEST: Clear.   HEART: Distant sounds. No murmurs or gallops I can hear.   EXTREMITIES: No edema.   SKIN: Warm and dry.   PSYCHIATRIC: Mood and affect are appropriate.   ABDOMEN: I will examine her abdomen later. She is currently sitting up in chair.  LABORATORY, DIAGNOSTIC AND RADIOLOGICAL DATA: Beta-type natriuretic peptide is 24,900. BUN 33, glucose 78, creatinine 4.5, sodium 135, potassium 4, chloride 97, CO2 30, total protein 7.9, albumin 3.1, total bilirubin 0.4, alkaline phosphatase 298, SGOT 19 SGPT 16. Troponins are negative. Hemoglobin was 7.1; after transfusion it was 8.6. White count 5.4, platelet count 217. She does have an antibody. She has A+ blood with a positive antibody screen for K-type antibody. Blood culture showed no growth. Chest x-ray shows findings consistent with congestive heart failure with pulmonary essential edema and left pleural effusion.   ASSESSMENT/PLAN : Anemia of chronic disease possibly made worse by recurrent bleeding from probable hemorrhoids: Given her negative colonoscopy three years ago and her overall medical health, I do not plan to repeat a colonoscopy at this time except for  life-threatening bleeding. We will start her on Anusol-HC suppositories, track iron, iron binding, R83, folic acid and reticulocyte counts.  This is likely anemia of chronic disease given all her chronic diseases.    We will follow with you.    ____________________________ Manya Silvas, MD rte:cbb D: 06/14/2012 16:24:07 ET T: 06/15/2012 07:06:24 ET JOB#: [id number]  cc: Manya Silvas, MD, <Dictator> Manya Silvas MD ELECTRONICALLY SIGNED 06/28/2012 11:48

## 2014-11-02 NOTE — H&P (Signed)
PATIENT NAME:  [name], [name] MR#:  [id number] DATE OF BIRTH:  [birth year]  DATE OF ADMISSION:  02/27/2012  PRIMARY CARE PHYSICIAN: Lorelee Market, MD  ER PHYSICIAN: Lavonia Drafts, MD  NEPHROLOGIST: Murlean Iba, MD  HISTORY OF PRESENT ILLNESS: The patient is a 59 year old female with multiple medical problems of ESRD on hemodialysis, systolic heart failure with ejection fraction around 25%, history of atrial fibrillation, and seizure disorder who was found in the [facility] unresponsive. The patient was came here to have dialysis catheter access evaluation by vascular today. The patient collapsed in the lobby and was found to have heart rate around 10. The patient was given calcium gluconate. Initial EKG showed 45 beats of heart rate with third degree heart block. After receiving calcium gluconate, the patient's mental status has improved and heart rate improved to 54 with RVR. I was asked to admit the patient for collapse with severe bradycardia, hyperkalemia and regarding urgent hemodialysis. The patient was seen at bedside. . The patient is supposed to followup with Dr. Hortencia Pilar for catheter evaluation, so she came in today. The patient says that she had a seizure in the lobby, but according to the ER physician she had bradycardia and collapsed and right now she is not postictal and she feels very weak. She has no chest pain and no shortness of breath, but complains of abdominal pain, generalized, and had diarrhea about two times yesterday and vomited once last night, no cough. The patient received 1 amp of calcium gluconate and 2 amps of calcium chloride in the emergency room. I spoke to Dr. Candiss Norse for urgent hemodialysis.   PAST MEDICAL HISTORY:  1. History of end-stage renal disease on hemodialysis Tuesday, Thursday, and Saturday. 2. Bilateral nephrectomy. 3. Seizure disorder. 4. Morbid obesity.  5. History of tubular  sclerosis. 6. Hypertension. 7. Depression. 8. Asthma. 9. Secondary hyperparathyroidism. 10. History of MSSA sepsis admitted from 04/19 to 04/21. At that time, HeRO graft was removed and left temporary catheter was placed in April. She had left femoral catheter placed on 04/29. She has an AV graft also on the left arm placed by Dr. Hortencia Pilar on 07/26.  11. History of congestive heart failure with ejection fraction 25%.  ALLERGIES: She is allergic to iodine and Medrol.   MEDICATIONS: The patient is from [facility] 1. Advair 250/50 one puff twice a day. 2. Allegra 1 tablet daily.  3. Aspirin 81 mg daily.  4. Colace 100 mg p.o. twice a day. 5. Coreg 3.125 mg p.o. daily. 6. Cymbalta 60 mg daily. 7. Flonase 50 mcg daily. 8. Keppra 1 gram p.o. daily and 500 mg Tuesday, Thursday, and Saturday after dialysis.  9. Lisinopril 5 mg p.o. daily.  10. Loratadine 10 mg daily.  11. Nepro one can p.o. twice a day. 12. Omeprazole 20 mg p.o. twice a day.  13. Oxycodone 5 mg every 12 hours. 14. Oxycodone 5 mg every six hours as needed for pain. 15. PhosLo 667 two capsules p.o. three times daily. 16. Primidone 250 mg p.o. 1 in the morning and 1/2 in the evening. 17. Renvela 800 mg 2 tablets three times daily. 18. Sorbitol as needed for constipation.  19. Topamax 1 tablet p.o. twice a day. 20. Xanax 0.25 mg every six hours p.r.n. for nausea and vomiting.   SOCIAL HISTORY:  No smoking. No drinking. No drugs. Lives at [facility].   REVIEW OF SYSTEMS: CONSTITUTIONAL: The patient complains of fatigue. EYES: No blurred vision. ENT:  No tinnitus. No epistaxis. No difficulty swallowing. RESPIRATORY: No cough. No trouble breathing. CARDIOVASCULAR: No chest pain. GI: Complains of vomiting and diarrhea and abdominal pain. ENDOCRINE: No polyuria or nocturia. INTEGUMENTARY: The patient does have some facial rash, especially around nose. MUSCULOSKELETAL: The patient has no joint pains at this time.  NEUROLOGIC: History of seizures present. Speech is not slurred. No headache. PSYCH: Has history of depression.   PHYSICAL EXAMINATION:   VITALS: Initial heart rate was 10 when she was found unresponsive in the [place], but heart rate improved. Initial EKG showed 15, but heart rate right now is around 60 and she is in atrial fibrillation. The patient's documented heart rate was 23 initially and blood pressure was 32/12. Repeat blood pressure 94/59.  GENERAL: She is alert, able to communicate.   HEENT: Head atraumatic, normocephalic. Pupils are equally reacting to light. Extraocular movements are intact. No tympanic membrane congestion. No turbinate hypertrophy. The patient has erythematous rash around the face and also mainly localized around the nose and cheeks.   NECK: Supple. No JVD.   CARDIOVASCULAR: S1 and S2 regular. PMI not displaced.   LUNGS: The patient has decreased breath sounds at bases. No rales.   ABDOMEN: Obese, soft. Slight tenderness present in the epigastric area. Bowel sounds present.   EXTREMITIES: Bilateral extremity edema present up to knees and has dialysis catheter present in the left groin and has a bandage also present on the left AV graft.  NEUROLOGIC: Oriented to time, place, and person. No focal neurological deficit. Cranial nerves II through XII intact. Power 5 out of 5 in upper and lower extremities. Sensation is intact.   SKIN: Moist and warm.   LYMPH NODES: No lymphadenopathy in cervical or axillary region.   LABS/STUDIES: Electrolytes: Sodium 130, potassium 5.8, chloride 95, bicarbonate 23, BUN 104, creatinine 7.94, and glucose 166. LFTs within normal limits. Alkaline phosphatase is elevated at 268 and calcium 11.9. WBC is 6.6, hemoglobin 11.1, hematocrit 33.9, and platelets 184. Troponin 0.03. CK total 28 and CPK-MB 1.3. INR is 1. Magnesium 3.2 and phosphorus 5.4.   Initial EKG showed interventricular block rate of 45 beats.   Repeat EKG done after 20  minutes showed heart rate 54 beats with atrial fibrillation with slow ventricular response. T wave inversions in V1, V2, V3,. V4, V5, V6, and aVL.   ASSESSMENT AND PLAN:  59. [name] with multiple medical problems who came in with collapse secondary to severe bradycardia. The patient did receive calcium gluconate which helped her and bradycardia secondary to hyperkalemia. The patient is going to be admitted to Intensive Care Unit because of severe bradycardia causing the collapse. Hold the Coreg and obtain cardiology consult with Dr. Fletcher Anon. The patient right now is in atrial fibrillation and heart rate has improved to around 60s so continue to hold the Coreg and monitor in the Intensive Care Unit.  2. End-stage renal disease. She missed hemodialysis yesterday because of PermCath problems. Right now she has hyperkalemia and uremia. I spoke to Dr. Candiss Norse for urgent hemodialysis. He said he can use the left AV graft and the patient is going to have dialysis. Recheck the potassium again.  3. Hyperkalemia due to missed dialysis. She already got calcium gluconate and continue the dialysis.  4. History of cardiomyopathy with severe congestive heart failure of less than 25. Right now she is not in acute congestive heart failure. Continue to hold Coreg due to bradycardia and monitor clinically. Get enzymes. Initial set of troponin is  negative so continue to cycle cardiac markers and maybe repeat the echocardiogram.  5. History of chronic obstructive pulmonary disease, stable at this time. Continue her on home medication.  6. History of seizure disorder. The patient thinks she had a seizure, but it was not witnessed. She is not postictal as well. We will get a CT of the head, give Keppra IV 1 gram daily and also 500 mg after each dialysis.  7. History of depression. She is on Cymbalta.          8. Regarding the heart block and bradycardia, I spoke with Dr. Rockey Situ who is on cal he recommended to  stop the Coreg and monitor in the Intensive Care Unit and correct the electrolytes. He will see patient.  TIME SPENT: About 60 minutes. This is a critical history and physical.  ____________________________ Epifanio Lesches, MD sk:slb D: 02/27/2012 09:32:38 ET T: 02/27/2012 10:02:55 ET JOB#: [id number]  cc: Epifanio Lesches, MD, <Dictator> Meindert A. Brunetta Genera, MD Epifanio Lesches MD ELECTRONICALLY SIGNED 04/01/2012 16:22

## 2014-11-02 NOTE — H&P (Signed)
PATIENT NAME:  [name], [name] MR#:  [id number] DATE OF BIRTH:  Mar 10, 1956  DATE OF ADMISSION:  05/24/2012  PRIMARY CARE PROVIDER: Dr. Lacie Scotts. ED REFERRING PHYSICIAN: Dr. Mindi Junker.   CHIEF COMPLAINT: Shortness of breath, chest pressure.   HISTORY OF PRESENT ILLNESS: The patient is a 59 year old Caucasian female who has end-stage renal disease who has required recurrent admissions to the hospital for similar complaints. She was here in September, presented with chest pain and near fainting. At that time, she had to have emergent dialysis because of hyperkalemia and acute volume overload. She also was seen by Cardiology at that time and felt that some of the symptoms may be related to a fluid overload. After being dialyzed for a few days, the patient's symptoms improved; and she was discharged home. She returns back with complaint of having shortness of breath ongoing for 1 week. She reports that she is short of breath with activity and exertion. She did get dialyzed on Thursday. She otherwise also complains of dry cough. She complains of having chest pain on and off for the past 1 week. It lasts for a few minutes. She describes it as a sharp type of pain. She otherwise denies any abdominal pain, nausea, vomiting, or diarrhea. Denies any urinary symptoms.   PAST MEDICAL HISTORY:  Significant for: 1. Hypertension.  2. End-stage renal disease.  3. History of congestive heart failure with ejection fraction of 25% based on her echocardiogram done on April 21 with severe dilated left ventricle with severe global hypokinesis.  4. Seizure disorder.  5. Bradycardia.  6. Hyperkalemia.  7. Asthma.  8. Secondary hyperparathyroidism.  9. Depression.  10. History of MRSA sepsis in April.   ALLERGIES: IV contrast, iodine, Medrol, and trazodone.   CURRENT MEDICATIONS: Per her recent discharge. The patient does not have a most active list but there are no changes in her medications. 1. Advair 250/50 one  inhalations b.i.d.  2. Aspirin 81 one 1 tab p.o. daily.  3. Colace 100 one tab p.o. b.i.d.  4. Coreg 3.125 one tab p.o. b.i.d.  5. Cymbalta 60 mg daily.  6. Fexofenadine 180 daily.  7. Flonase 2 sprays daily.  8. Keppra 1000 mg 1 tab Monday, Wednesday, Friday and Sunday with 1 tab 500 mg extra on dialysis days.  9. Lidoderm 5% patch apply to affected area b.i.d.  10. Protonix 40 daily.  11. PhosLo 2 caps 3 times per day. 12. Primidone 125 daily.  13. Renvela 800 mg 1 tab p.o. at bedtime.  14. Renvela 800 two tabs 3 times per day. 15. Topamax 200 one tab p.o. b.i.d.  16. Tramadol 50 q.6 p.r.n. pain.  17. Tylenol 650 q.6 p.r.n.   SOCIAL HISTORY: Does not smoke. No alcohol or drug use.    REVIEW OF SYSTEMS. CONSTITUTIONAL: Denies any fevers, chills. No significant weight loss or weight gain. HEENT: Denies any visual difficulties. No blurred vision. No erythema. Denies any cataracts or glaucoma. ENT: Denies any tinnitus. No ringing in her ears. No difficulty swallowing. CARDIOVASCULAR: Complains of chest pain, complains of dyspnea on exertion, no palpitations. Has a history of syncope. No arrhythmias. PULMONARY: Has history of asthma. No chronic obstructive pulmonary disease. No hemoptysis. No wheezing. No wheezing. GI: Denies any nausea and vomiting. Has chronic constipation. Denies any hematemesis, hematochezia. GENITOURINARY: Does not make any urine. ENDOCRINE: Denies any polyuria, nocturia, or thyroid problems. HEME/LYMPH: Has history of chronic anemia. No easy bruisability or bleeding. SKIN: Has chronic rash involving her face. MUSCULOSKELETAL:  Has chronic pain all over her body. Denies any gout. NEUROLOGIC: No CVA. No transient ischemic attack. Has history of seizure disorder. PSYCHIATRIC: Has a history of anxiety.   PHYSICAL EXAMINATION: Patient is a morbidly obese Caucasian female not in any distress.    VITAL SIGNS: Temperature 97.6, pulse 88, respirations 24, blood pressure 134/52, O2  of 94% on 2 liters.   GENERAL: The patient is a morbidly obese Caucasian female currently not in any acute distress.   HEENT: Head atraumatic, normocephalic. Pupils equally round, reactive to light and accommodation. There is no conjunctival pallor. No scleral icterus. Nasal exam shows no drainage or ulceration. Oropharynx is clear without any exudate.   NECK: No thyromegaly. No carotid bruits.   CARDIOVASCULAR: Regular rate and rhythm. No murmurs, rubs, clicks, or gallops. PMI is not displaced.   LUNGS: Bilateral crackles without any rales, rhonchi, or wheezing.   ABDOMEN: Soft, nontender, nondistended. Positive bowel sounds x4.   EXTREMITIES: 1+ edema.   SKIN: She has some erythematous rash involving her nose and her face, her neck, which is chronic.   LYMPHATICS: No lymph nodes palpable.   VASCULAR: Good DP, PT pulses.   PSYCHIATRIC: Not anxious or depressed.   NEUROLOGICAL: Awake, alert, oriented x3. No focal deficits.   PSYCHIATRIC: Currently not anxious or depressed.   LABORATORIES: Glucose 104, BUN 24, creatinine 4.50, sodium 129, potassium 4.4, chloride 93, CO2 is 26. LFTs: Total protein 8.0, albumin of 2.8, bilirubin total 0.5, alkaline phosphatase 398, AST 28, ALT 13. CPK 90, CK-MB 2.8. Troponin less than 0.02. WBC 6.5, hemoglobin 8.8, platelet count 246,000. INR 1.2. Chest x-ray shows cardiomegaly with bilateral infiltrates consistent with likely congestive heart failure.   ASSESSMENT AND PLAN: The patient is a 59 year old white female with history of end-stage renal disease, congestive heart failure, seizure disorder, asthma who presents with chest pain and shortness of breath.  1. Chest pain, recurrent in nature. At this time we will continue her aspirin. Serial cardiac enzymes, continue nitroglycerin patch. We will have a cardiologist evaluate the patient.  2. Acute systolic congestive heart failure/volume overload. Does not make urine. However, we will try some IV  Lasix. I have spoken to Dr. Thedore Mins, who will perform hemodialysis on the patient.  3. Seizure disorder. Continue Keppra.  4. Anxiety. We will continue Xanax.  5. Asthma. We will continue her Advair as taking at home. I will place her on p.r.n. nebulizers as needed.  6. Anemia, which is chronic. We will follow her hemoglobin. Transfuse as needed.  7. Miscellaneous. We will place her on heparin for deep vein thrombosis prophylaxis.   TIME SPENT: 45 minutes spent on the history and physical.    ____________________________ Lacie Scotts. Allena Katz, MD shp:vtd D: 05/24/2012 10:25:42 ET T: 05/24/2012 10:36:37 ET JOB#: [id number]  cc: Kiersten Coss H. Allena Katz, MD, <Dictator> Meindert A. Lacie Scotts, MD Charise Carwin MD ELECTRONICALLY SIGNED 05/29/2012 13:30

## 2014-11-02 NOTE — Consult Note (Signed)
General Aspect 59 year old female with a history of end-stage renal disease on hemodialysis due to nephrectomies for renal mass, asthma, chronic obstructive pulmonary disease, seizure disorder, schizophrenia, and possible mental retardation, morbidly obese, MSSA bacteremia due to dialysis graft infection, previous  echocardiogram  which showed severely reduced LV systolic function with an ejection fraction less than 25%,seizure disorder who was found in the [place] unresponsive.Cardiology was consulted for bradycardia.   The patient was came here to have dialysis catheter access evaluation by vascular today. The patient collapsed in the lobby and was found to have heart rate around 10. The patient was given calcium gluconate. Initial EKG showed escape junction rhythm, long pauses.  After receiving calcium gluconate, the patient???s mental status has improved and heart rate improved to 54 with RVR.   She  went for dialysis yesterday.  catheter was exposed so dialysis was not done yesterday. The patient is supposed to followup with Dr. Delana Meyer for catheter evaluation, so she came in today. The patient says that she had a seizure in the lobby, but according to the ER physician she had bradycardia and collapsed. She denies  chest pain and no shortness of breath, but complains of abdominal pain, generalized, and had diarrhea about two times yesterday and vomited once last night, no cough.   The patient received 1 amp of calcium gluconate and 2 amps of calcium chloride in the emergency room.  In the CCU, rate continued to be in the high 30s. BP 100/60. Mentating ok. She was started ion dopamine 4 ug/min For herart rate and BP support, for HD    Present Illness . SOCIAL HISTORY: Lives in a group home. No tobacco, alcohol or drug use.   FAMILY HISTORY: Father with diabetes. Mother is deceased in the setting of car accident.   Physical Exam:   GEN well developed, well nourished, no acute distress,  obese    HEENT red conjunctivae    NECK supple  No masses    RESP normal resp effort  clear BS    CARD Regular rate and rhythm  Murmur    Murmur Systolic    ABD denies tenderness  soft    LYMPH negative neck    EXTR negative edema    SKIN normal to palpation    NEURO motor/sensory function intact    PSYCH alert, A+O to time, place, person   Review of Systems:   Subjective/Chief Complaint "I had a seizure" (does not remember details)    General: Fatigue  Weakness    Skin: No Complaints    ENT: No Complaints    Eyes: No Complaints    Neck: No Complaints    Respiratory: No Complaints    Cardiovascular: No Complaints    Gastrointestinal: Nausea  yesterday    Genitourinary: No Complaints    Vascular: No Complaints    Musculoskeletal: No Complaints    Neurologic: No Complaints    Hematologic: No Complaints    Endocrine: No Complaints    Psychiatric: No Complaints    Review of Systems: All other systems were reviewed and found to be negative    Medications/Allergies Reviewed Medications/Allergies reviewed     Dialysis:    CHF:    COPD:    Anemia:    Renal Failure:    MRSA within past 6 months: Apr 2013   Hernia:    seizures:        Admit Diagnosis:   BRADYCARDIA SYNCOPE: 27-Feb-2012, Active, BRADYCARDIA SYNCOPE  Admit Reason:   Syncope: (780.2) Active, ICD9, Syncope and collapse   Bradycardia: (427.89) Active, ICD9, Other specified cardiac dysrhythmias  Home Medications: Medication Instructions Status  Cymbalta 60 mg oral delayed release capsule 1 cap(s) orally once a day Active  lisinopril 5 mg oral tablet 1 tab(s) orally once a day Active  Coreg 3.125 mg oral tablet 1 tab(s) orally 2 times a day Active  aspirin 81 mg oral tablet 1 tab(s) orally once a day Active  PhosLo Gelcap 667 mg oral capsule 2 cap(s) orally 3 times a day (with meals) Active  Renvela 800 mg oral tablet 2 tab(s) orally 3 times a day (with meals) Active   Nepro Liquid 1 can orally 2 times a day Active  Xanax 0.25 mg oral tablet 1 tab(s) orally every 6 hours, As Needed- for Anxiety, Nervousness  Active  Keppra 1000 mg oral tablet 1 tab(s) orally once a day  Active  Keppra 500 mg oral tablet 1 tab(s) orally once a day on Tuesday, Thursday, and Saturday post-dialysis (along with Keppra 1000 mg) Active  Topamax 200 mg oral tablet 1 tab(s) orally 2 times a day Active  Flonase 50 mcg/inh nasal spray 2 spray(s) nasal once a day Active  Advair Diskus 250 mcg-50 mcg inhalation powder 1 puff(s) inhaled 2 times a day Active  Delsym 12 Hour Cough Relief 30 mg/5 mL oral suspension, extended release 10 milliliter(s) orally every 6 hours, As Needed Active  oxycodone 5 mg oral tablet 1 tab(s) orally every 6 hours, As Needed- for Pain  Active  oxycodone 5 mg oral tablet 1 tab(s) orally every 12 hours Active  Colace 100 mg oral capsule 2 cap(s) orally once a day Active  primidone 250 mg oral tablet 1 tab(s) orally in the morning and 0.5 tab in the evening Active  Allegra 24 Hour Allergy oral tablet 1 tab(s) orally once a day Active  sorbitol 70% oral liquid 30 milliliter(s) orally once a day, As Needed- for Constipation  Active  loratadine 10 mg oral tablet 1 tab(s) orally once a day Active  omeprazole 20 mg oral delayed release capsule 1 cap(s) orally 2 times a day Active   Lab Results:  Hepatic:  14-Aug-13 08:00    Bilirubin, Total 0.5   Alkaline Phosphatase  268   SGPT (ALT) 15   SGOT (AST) 22   Total Protein, Serum 7.6   Albumin, Serum 3.5  Routine Chem:  14-Aug-13 08:00    Magnesium, Serum  3.2 (1.8-2.4 THERAPEUTIC RANGE: 4-7 mg/dL TOXIC: > 10 mg/dL  -----------------------)   Phosphorus, Serum  5.4 (Result(s) reported on 27 Feb 2012 at 08:35AM.)   Glucose, Serum  166   BUN  104   Creatinine (comp)  7.94   Sodium, Serum  130   Potassium, Serum  5.8   Chloride, Serum  95   CO2, Serum 23   Calcium (Total), Serum  11.9   Osmolality (calc)  297   eGFR (African American)  6   eGFR (Non-African American)  5 (eGFR values <44m/min/1.73 m2 may be an indication of chronic kidney disease (CKD). Calculated eGFR is useful in patients with stable renal function. The eGFR calculation will not be reliable in acutely ill patients when serum creatinine is changing rapidly. It is not useful in  patients on dialysis. The eGFR calculation may not be applicable to patients at the low and high extremes of body sizes, pregnant women, and vegetarians.)   Anion Gap 12  Cardiac:  14-Aug-13 08:00  Troponin I 0.03 (0.00-0.05 0.05 ng/mL or less: NEGATIVE  Repeat testing in 3-6 hrs  if clinically indicated. >0.05 ng/mL: POTENTIAL  MYOCARDIAL INJURY. Repeat  testing in 3-6 hrs if  clinically indicated. NOTE: An increase or decrease  of 30% or more on serial  testing suggests a  clinically important change)   CK, Total 28   CPK-MB, Serum 1.3 (Result(s) reported on 27 Feb 2012 at 08:41AM.)  Routine Coag:  14-Aug-13 08:00    Prothrombin 13.1   INR 1.0 (INR reference interval applies to patients on anticoagulant therapy. A single INR therapeutic range for coumarins is not optimal for all indications; however, the suggested range for most indications is 2.0 - 3.0. Exceptions to the INR Reference Range may include: Prosthetic heart valves, acute myocardial infarction, prevention of myocardial infarction, and combinations of aspirin and anticoagulant. The need for a higher or lower target INR must be assessed individually. Reference: The Pharmacology and Management of the Vitamin K  antagonists: the seventh ACCP Conference on Antithrombotic and Thrombolytic Therapy. LZJQB.3419 Sept:126 (3suppl): [id number]. A HCT value >55% may artifactually increase the PT.  In one study,  the increase was an average of 25%. Reference:  "Effect on Routine and Special Coagulation Testing Values of Citrate Anticoagulant Adjustment in Patients with High HCT  Values." American Journal of Clinical Pathology 2006;126:400-405.)  Routine Hem:  14-Aug-13 08:00    WBC (CBC) 6.6   RBC (CBC)  3.35   Hemoglobin (CBC)  11.1   Hematocrit (CBC)  33.9   Platelet Count (CBC) 184 (Result(s) reported on 27 Feb 2012 at 08:44AM.)   MCV  101   MCH 33.1   MCHC 32.8   RDW  14.7   EKG:   Interpretation Initial EKG shows heart block with escape rhythm, wide comples (2 beats in 8 seconds) Follow up EKG shows NSR with RBBB, old inferior MI, wide comples rhythm rate 54 bpm, likely NSR with PVCS   Radiology Results: XRay:    14-Aug-13 08:11, Chest Portable Single View   Chest Portable Single View    PRELIMINARY REPORT    The following is a PRELIMINARY Radiology report.  A final report will follow pending radiologist verification.      REASON FOR EXAM:    hyperkalemia  COMMENTS:       PROCEDURE: DXR - DXR PORTABLECHEST SINGLE VIEW  - Feb 27 2012  8:11AM     RESULT: Comparison is made to the study dated February 14, 2012.    The cardiac silhouette is enlarged. There is no definite infiltrate,   edema, effusion or pneumothorax. The retrocardiac region is poorly seen   because of the patient's body habitus but no definite infiltrate is   demonstrated.    IMPRESSION:   1. Stable cardiomegaly.   2. No definite acute cardiopulmonary disease.  Thank you for the opportunity to contribute to the care of your patient.    Dictation Site: 2          Dictated By: Sundra Aland, M.D., MD    Iodine: Hives  Medrol: Hives  Contrast - Iodinated Radiocontrast Dye: Hives  Vital Signs/Nurse's Notes: **Vital Signs.:   14-Aug-13 13:00   Pulse Pulse 70   Respirations Respirations 22   Systolic BP Systolic BP 379   Diastolic BP (mmHg) Diastolic BP (mmHg) 60   Mean BP 83   Pulse Ox % Pulse Ox % 100   Pulse Ox Activity Level  At rest   Oxygen Delivery 2L; Nasal  Cannula   Pulse Ox Heart Rate 42     Impression [name] with multiple medical  problems who came in with collapse secondary to severe bradycardia, severe elctrolyte ABN from ESRD, missing HD yesterday (last on Saturday 4 days ago).   1) Arrhythmia Heart block, then junctional escape rhythm, now NSR wityh frequent PVCs (on dopamine) --Woulod continue dopamine for now for heart rate support. Will likely need HD tomorrow if electrolytes ABN. --Hold coreg for now. No indication for permanent pacer at this time as likely reversible condition.   2. End-stage renal disease.  She missed hemodialysis yesterday because of PermCath problems. hyperkalemia and uremia, elevated calcium. Using left AV graft for HD currently.  3. Hyperkalemia due to missed dialysis.  Received calcium gluconate with some improvement in rhythm.  Receiving dialysis.   4. History of cardiomyopathy with severe congestive heart failure, EF of less than 25%  Holding coreg until rhythm returns to baseline HD for fluid overlod.  5. History of chronic obstructive pulmonary disease,  stable at this time. Continue her on home medication.   6. History of seizure disorder.  The patient thinks she had a seizure, but it was not witnessed.  Receiving  Keppra IV 1 gram daily and also 500 mg after each dialysis.   7. History of depression.  She is on Cymbalta.   Electronic Signatures: Ida Rogue (MD)  (Signed 14-Aug-13 13:52)  Authored: General Aspect/Present Illness, History and Physical Exam, Review of System, Past Medical History, Health Issues, Home Medications, Labs, EKG , Radiology, Allergies, Vital Signs/Nurse's Notes, Impression/Plan   Last Updated: 14-Aug-13 13:52 by Ida Rogue (MD)

## 2014-11-05 NOTE — Op Note (Signed)
PATIENT NAME:  [name], [name] MR#:  [id number] DATE OF BIRTH:  May 16, 1956  DATE OF PROCEDURE:  02/04/2013  ATTENDING PHYSICIAN: Harrell Gave A. Braeden Dolinski, M.D.   PREOPERATIVE  DIAGNOSIS:  Hypotension and poor IV access.   POSTOPERATIVE DIAGNOSIS:  Hypotension and poor IV access.  PROCEDURE PERFORMED: Left ultrasound-guided femoral vein central venous catheter placement.   ESTIMATED BLOOD LOSS: 10 mL milliliters.   COMPLICATIONS: None.   INDICATION FOR SURGERY: [name] is a pleasant 59 year old female who presented with hypotension on pressors and poor IV access. She had earlier refused a central line with Dr. Lucky Cowboy.  Her primary doctor had expressed to me the need for stable IV access for vasopressor medications.  I had spoken to Dr. Lucky Cowboy and looked though the chart and it sounds like she has a history of SVC stenosis. Therefore, I opted to do a femoral vein central venous catheter.   DETAILS OF PROCEDURE: As follows: Informed consent was obtained, [name]'s left groin was prepped and draped in standard surgical fashion. A timeout was then performed correctly identifying the patient name, operative site and procedure to be performed. A sterile ultrasound was used and the left femoral vein was visualized. It was large and patent. I then used an access needle to access the left femoral vein with one stick. It was nonpulsatile blood that was removed. I then placed the wire through the needle and removed the needle. The tract was then dilated. The central venous catheter was placed into the femoral vein. The catheter was then flushed and drew easily. It was then sutured in place and a sterile dressing placed over it. The drapes were then taken down. There were no immediate complications. Needle, sponge, and instrument counts were correct at the end of the procedure.    ____________________________ Glena Norfolk. Daija Routson, MD cal:dp D: 02/04/2013 18:26:54 ET T: 02/04/2013 20:16:08  ET JOB#: [id number]  cc: Harrell Gave A. Jillayne Witte, MD, <Dictator> Floyde Parkins MD ELECTRONICALLY SIGNED 02/07/2013 11:34

## 2014-11-05 NOTE — Op Note (Signed)
PATIENT NAME:  [name], [name] MR#:  [id number] DATE OF BIRTH:  [birth year]  DATE OF PROCEDURE:  05/26/2013  PREOPERATIVE DIAGNOSES: 1.  Complication arteriovenous dialysis device.  2.  Superior vena cava syndrome.  3.  End-stage renal disease requiring hemodialysis.  4.  Morbid obesity.   POSTOPERATIVE DIAGNOSES:   1.  Complication arteriovenous dialysis device.  2.  Superior vena cava syndrome.  3.  End-stage renal disease requiring hemodialysis.  4.  Morbid obesity.   PROCEDURE PERFORMED: Insertion left femoral cuffed tunneled dialysis catheter with fluoroscopic and ultrasound guidance.   SURGEON: Hortencia Pilar, M.D.   SEDATION: Versed 3 mg plus fentanyl 100 mcg administered IV. Continuous ECG, pulse oximetry and cardiopulmonary monitoring is performed throughout the entire procedure by the interventional radiology nurse. Total sedation time was 30 minutes.   ACCESS: Left common femoral vein.   FLUOROSCOPY TIME: 0.3 minutes.   CONTRAST USED: None.   INDICATIONS: [name] is a 59 year old woman who was sent from dialysis secondary to thrombosis of her AV access. She has been declotted recently multiple times. The risks and benefits for insertion of a tunneled catheter were reviewed. All questions answered. The patient agrees to proceed.   DESCRIPTION OF PROCEDURE: The patient is taken to special procedures and placed in the supine position. After adequate sedation has been achieved, ultrasound is used to evaluate her left neck. A very tortuous anterior jugular vein appears to be present on the right. The internal jugular vein proper is nonvisualized. Common carotid is localized. Similar findings on the left, and therefore it does not appear that there is acceptable access in the jugular distribution. Attention is then turned to the femoral. The patient requests left femoral catheter be inserted. Ultrasound verifies patency of the common femoral vein.   The left groin is then  prepped and draped in a sterile fashion. Ultrasound is placed in a sterile sleeve. Ultrasound is utilized secondary to lack of appropriate landmarks to avoid vascular injury. Under direct ultrasound visualization, the common femoral vein is identified. It is echolucent and compressible indicating patency. Image is recorded for the permanent record. 1% lidocaine is infiltrated in the soft tissues, and using a Seldinger needle under direct ultrasound visualization, access is obtained to the vein without difficulty. Amplatz Super Stiff wire is then advanced under fluoroscopic guidance so that the tip is positioned in the atrium of the heart. A small counterincision is created at the wire insertion site, dilator is passed over the wire, and the dilator and peel-away sheath are inserted. Dilator is removed, and a 50 cm tip-to-cuff Cannon catheter is advanced over the wire through the peel-away sheath. After verifying tips are at the level of the atrium, the wire and subsequently peel-away sheath are removed. Exit site is then selected after adjusting the tips slightly, and the tunneling device is passed subcutaneously. The catheter is pulled through the tunnel, transected and the hub is connected without difficulty. Both lumens aspirate and flush easily and are packed with 5000 units of heparin per lumen.   0 silk is used to secure the catheter to the skin of the thigh and a 4-0 Monocryl is used to close the counterincision, followed by Dermabond. Sterile dressing is applied. The patient tolerated the procedure well, and there were no immediate complications.     ____________________________ Katha Cabal, MD ggs:dmm D: 05/26/2013 16:35:24 ET T: 05/26/2013 19:22:14 ET JOB#: [id number]  cc: Katha Cabal, MD, <Dictator> Katha Cabal MD ELECTRONICALLY SIGNED 06/22/2013 17:15

## 2014-11-05 NOTE — H&P (Signed)
PATIENT NAME:  [name], [name] MR#:  [id number] DATE OF BIRTH:  Feb 01, 1956  DATE OF ADMISSION:  07/06/2013  PRIMARY CARE PHYSICIAN:  Dr. Darrick Huntsman.   CHIEF COMPLAINT: Hypertension, lightheadedness and weakness.   HISTORY OF PRESENTING ILLNESS: A 59 year old obese Caucasian female patient with end-stage renal disease on hemodialysis, on midodrine secondary to chronic hypotension, chronic systolic CHF with EF of 25%, pacemaker in place and seizure disorder, presents to the hospital, sent in from dialysis center after she was feeling lightheaded, dizzy and hypotension with systolic of 50s. Here in the Emergency Room, the patient's blood pressure has been fluctuating from systolic of 50 to 70, still feels lightheaded in spite of being in a Trendelenburg position. She could not get her dialysis today.   She does not complain of any shortness of breath, chest pain, diarrhea, dysuria or cough or shortness of breath. No new rash. Is not on any blood pressure pills. She does take midodrine prior to dialysis. She gets her dialysis normally on Tuesday, Thursday and Saturday, but due to the holidays, her schedule has been changed to Monday and Wednesday.   The patient was last in the hospital in July 2014 for septic shock secondary to C. diff. Her blood pressure prior to discharge, looking at the progress note, was systolic of 117. She does mention that her blood pressure runs low into the 80s, but today it is worse than normal, and the patient is symptomatic.   She does have a long history of dysfunction of her AV graft. Recently had a procedure on her right upper extremity by Dr. Gilda Crease on 06/24/2013, and a lot of sclerosis on the veins with occlusion, and presently is getting dialysis through a left femoral dialysis catheter. She sees Dr. Caryn Section of nephrology in [place].   PAST MEDICAL HISTORY: 1.  End-stage renal disease on hemodialysis on Tuesday, Thursday, Saturday.  2.  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  3.  Chronic systolic CHF with EF of 25%.  4.  Seizure disorder.  5.  Sick sinus syndrome, status post pacemaker.  6.  Asthma.  7.  Anemia of chronic disease.  8.  MRSA and sepsis.  9.  Depression.  10.  Hemorrhoids.  11.  Chronic hypotension.   PAST SURGICAL HISTORY: 1.  Pacemaker placement and AICD placement.  2.  Bilateral nephrectomy.  3.  Tonsillectomy.  4.  Cholecystectomy.   SOCIAL HISTORY: The patient does not smoke. No alcohol. No illicit drugs. Resides at [facility].   FAMILY HISTORY: Father had diabetes and history of stroke. Mother died from car accident.   ALLERGIES: CONTRAST DYE, IODINE, MEDROL AND TRAZODONE.     REVIEW OF SYSTEMS:   CONSTITUTIONAL: Complains of fatigue, weakness, lightheadedness.  EYES: No blurred vision, pain or redness.  EARS, NOSE, THROAT: No tinnitus, ear pain, hearing loss.  RESPIRATORY: No cough, wheeze, hemoptysis.  CARDIOVASCULAR: No chest pain, orthopnea, edema.  GASTROINTESTINAL: No nausea, vomiting or diarrhea.  GENITOURINARY:  No dysuria, hematuria or frequency.  ENDOCRINE: No polyuria, nocturia or thyroid problems.  HEMATOLOGIC AND LYMPHATIC: Has chronic anemia. No easy bruising or bleeding.  INTEGUMENTARY: No acne, rash, lesion.  MUSCULOSKELETAL: No arthritis or back pain.  NEUROLOGIC: No focal numbness, weakness, seizure.  PSYCHIATRY:  Has anxiety.   HOME MEDICATIONS: Include:  1.  Biotene mouthwash 10 mL oral 3 times a day.  2.  Doxycycline 1 capsule oral every other day 100 mg.  3.  Dulcolax laxative as needed.  4.  Ferrous sulfate 325 mg oral 2 times a day.  5.  Fexofenadine 180 mg oral once a day.  6.  Keppra 1000 mg oral once a day on Monday, Wednesday, Friday.  7.  Lamictal 25 mg oral once a day. 9.  Midodrine 10 mg oral 3 times a day.  10.  MiraLAX 17 grams oral 2 times a day.  11.  Nexium 40 mg oral 2 times a day.  12.  Norco 325/5, 1 tablet oral every 6 hours as needed.  13.  PhosLo 667 mg 2  capsules oral 2 times a day.  14.  Renvela 2 tablets oral 2 times a day.  15.  Primidone 250 mg 1/2 tablet oral once a day at bedtime.  16.  Risperdal 0.5 mg oral once a day.  17.  Risperdal intramuscular injection extended release.  18.  Symbicort 160/4.5, 2 puffs inhaled 2 times a day.  19.  Topamax 200 mg oral 2 times a day.  20.  Tylenol 325, 2 tablets every 4 hours as needed.  21.  Xanax 0.5 mg oral once a day and 0.25 mg oral 2 times a day as needed for anxiety.  22.  Zoloft 50 mg daily.   PHYSICAL EXAMINATION: VITAL SIGNS:  Shows temperature 97.6, pulse of 60, blood pressure of 59/37 and presently at 69/38, and saturating 95% on room air.  GENERAL: Morbidly obese Caucasian female patient lying in bed, drowsy.  PSYCHIATRIC:  Oriented x 3, falls back to sleep easily.  HEENT: Atraumatic, normocephalic. Oral mucosa dry and pink. External ears and nose normal. No pallor or icterus. Pupils bilaterally equal and reactive to light. NECK: Supple.  No thyromegaly. No palpable lymph nodes. Trachea midline. No carotid bruit, JVD.  CARDIOVASCULAR: S1, S2, without any murmurs. Peripheral pulses 2+. No edema.  RESPIRATORY: Normal work of breathing. Clear to auscultation on both sides.  GASTROINTESTINAL: Soft abdomen, nontender. Bowel sounds present. No hepatosplenomegaly palpable.  SKIN: Warm and dry. No petechiae, rash, ulcers.  MUSCULOSKELETAL: No joint swelling, redness, effusion of the large joints. Normal muscle tone.  EXTREMITIES: Has prior AV graft, no thrill found, in the right upper extremity.  NEUROLOGICAL: Motor strength 5/5 in upper and lower extremities.   LABORATORY STUDIES: Show glucose of 100, BUN 62, creatinine 8, sodium 131, potassium 5.3. GFR of 5. HDL, LDL, alkaline phosphatase and bilirubin normal. Troponin less than 0.02. WBC 7.9, hemoglobin of 8.4, platelets of 131.   EKG shows a paced rhythm at 60 per minute. Chest x-ray shows cardiomegaly. No acute changes compared to  prior chest x-ray from August. Does have chronic changes.   ASSESSMENT AND PLAN: 1.  Hypotension into systolic of 60s in a patient with chronic hypotension.  She is definitely symptomatic with this low blood pressure, feeling lightheaded, dizzy, drowsy. We will admit the patient to CCU.  No clear etiology as to the cause. Initial troponin is normal. We will get an echocardiogram to rule out any worsening of her cardiomyopathy. The patient does mention that she took her midodrine earlier today. We will get blood cultures and urine cultures to rule out sepsis. The patient did have a recent vascular surgery about 12 days back. Start empirically on vancomycin and Zosyn. Will need pressors if blood pressure continues to be a low in spite of IV fluid resuscitation. We will have to watch out for any fluid overload, considering her low EF and on dialysis. Chest x-ray does not show any infiltrate.  2.  End-stage renal disease  on hemodialysis. We will consult nephrology for her dialysis needs, as she could not get her dialysis today.  3.  Anemia of chronic disease. The patient's hemoglobin ranges around 8.5 to 10 and presently is at 8.4. Needs to be monitored.  4.  Chronic systolic congestive heart failure. No signs of fluid overload, but we will have to watch her fluid overload, as she is getting IV fluids and is on dialysis.  5.  Seizure disorder. Continue medications.  6.  Sick sinus syndrome. The patient is paced at 60 per minute.  7.  Deep vein thrombosis prophylaxis on heparin.   Time spent today on this critically ill patient was 60 minutes.     ____________________________ Molinda Bailiff Lan Entsminger, MD srs:dmm D: 07/06/2013 11:59:00 ET T: 07/06/2013 12:14:21 ET JOB#: [id number]  cc: Wardell Heath R. Ledarrius Beauchaine, MD, <Dictator> Duncan Dull, MD Marina Gravel, MD Renford Dills, MD Orie Fisherman MD ELECTRONICALLY SIGNED 07/06/2013 14:34

## 2014-11-05 NOTE — Op Note (Signed)
PATIENT NAME:  [name], [name] MR#:  [id number] DATE OF BIRTH:  01-06-1956  DATE OF PROCEDURE:  04/13/2013  PREOPERATIVE DIAGNOSES:  1.  End-stage renal disease.  2.  Clotted left arm arteriovenous graft.  3.  Superior vena cava syndrome.  4.  Obesity.  5.  Epilepsy.   POSTOPERATIVE DIAGNOSES:  1.  End-stage renal disease.  2.  Clotted left arm arteriovenous graft.  3.  Superior vena cava syndrome.  4.  Obesity.  5.  Epilepsy.   PROCEDURES:  1.  Ultrasound guidance for vascular access in both an antegrade and retrograde fashion to left arm arteriovenous graft.  2.  Left upper extremity shuntogram and central venogram.  3.  Catheter-directed thrombolysis with 4 mg of tPA to the left arm arteriovenous graft.  4.  Mechanical rheolytic thrombectomy using the AngioJet AVX catheter.  5.  Fogarty embolectomy for arterial plug of arteriovenous graft.  6.  Percutaneous transluminal angioplasty of arterial anastomosis with 6 mm diameter angioplasty balloon for residual stenosis from arterial plug.  7.  Percutaneous transluminal angioplasty of midportion of graft and venous anastomosis with 7 mm diameter angioplasty balloon.    SURGEON: Algernon Huxley, M.D.   ANESTHESIA: Local with moderate conscious sedation.   ESTIMATED BLOOD LOSS: 25 mL.   INDICATION FOR PROCEDURE: This is a 59 year old white female with end-stage renal disease. She has very limited access options. She has superior vena cava occlusion. Her left arm AV graft has been declotted multiple times but does provide her access at this point. Given her size and other issues, a thigh graft or thigh PermCath have been avoided but may ultimately be necessary. We are attempting to salvage this graft. The risks and benefits were discussed. Informed consent was obtained.   DESCRIPTION OF PROCEDURE: The patient is brought to the vascular and interventional radiology suite. The left upper extremity was sterilely prepped and draped and a  sterile surgical field was created. The graft was accessed in both an antegrade and retrograde fashion crossing and 6-French sheaths were placed. The patient was given 3 doses of systemic heparin and Magic torque wires were placed to the brachial artery and into the subclavian vein. The graft was found to be thrombosed and 4 mg of tPA was delivered with the AngioJet AVX catheter in a power pulse spray fashion. This was allowed to dwell. There was a significant arterial plug, and a Fogarty embolectomy was performed to help clear this plug; however, this remained residual, and a 6 mm diameter angioplasty balloon was inflated at the arterial anastomosis with resolution of the plug. Mechanical rheolytic thrombectomy was also performed throughout the graft and into the central venous circulation. There was residual thrombus and stenosis in the midportion of the graft and at the venous anastomosis, were treated with a 7 mm diameter angioplasty balloon. At this point, the graft was patent and flowing. She continues to have central venous occlusive issues and this will likely hamper the durability of the graft, but it should provide access at this point.   I elected to terminate the procedure. The sheaths were removed around a 4-0 Monocryl pursestring sutures. Pressure was held. Sterile dressing was placed. The patient tolerated the procedure well and was taken to the recovery room in stable condition.    ____________________________ Algernon Huxley, MD jsd:np D: 04/13/2013 14:34:57 ET T: 04/13/2013 15:37:18 ET JOB#: [id number]  cc: Algernon Huxley, MD, <Dictator> Wel[place] MD ELECTRONICALLY SIGNED 04/15/2013 15:09

## 2014-11-05 NOTE — Consult Note (Signed)
Chief Complaint and History:  Referring Physician Dr. Manuella Ghazi   Chief Complaint Hypotension   Allergies:  Iodine: Hives  Medrol: Hives  Contrast - Iodinated Radiocontrast Dye: Hives  Trazodone: Unknown  Assessment/Plan:  Assessment/Plan Patient was seen in consultation. Briefly, this is a 59 yo F with renal failure on dialysis admitted on 12/22 for hypotension. Work-up for infection neg. She was started on hydrocortisone, fludrocortisone, and midodrine in addition to requiring levophed. No prior h/o adrenal insufficiency or known potential cause of adrenal insufficiency (eg malignancy, hemorrhage, iatrogenic from long-term steroids). BP has not responded to addition of these medications.  Pt examined and chart reviewed.   A/Refractory hypotension - likely due to combination of ESRD, CHF, and autonomic dysfunction. Less likely is adrenal insufficiency.  P/ 1. Taper off levophed as tolerated, both based on BP and symptoms.  1. OK to continue current dose of midodrine. 2. OK to continue current dose of fludorcortisone. There is likely no benefit from increasing the dose further. Would taper down if her hypotension resolves. 3. Taper off the hydrocortisone. Will decrease to 25 mg daily tomorrow and in 2 days then stop, if tol. Once off the hydrocortisone for 2 days, would check AM cortisol or a co-syntropin stimulated cortisol.   Full consultation has been dictated. Will follow along with you.   Electronic Signatures: Judi Cong (MD)  (Signed 29-Dec-14 17:19)  Authored: Chief Complaint and History, ALLERGIES, Assessment/Plan   Last Updated: 29-Dec-14 17:19 by Judi Cong (MD)

## 2014-11-05 NOTE — H&P (Signed)
PATIENT NAME:  [name], [name] MR#:  [id number] DATE OF BIRTH:  1956-04-07  DATE OF ADMISSION:  01/01/2013  PRIMRY CARE PHYSICIAN:  Dr. Benito Mccreedy, [facility]    REQUESTING PHYSICIAN: Dr. Meriel Flavors.    CHIEF COMPLAINT: Low blood pressure.   HISTORY OF PRESENT ILLNESS: The patient is a 59 year old female with a known history of end-stage renal disease on hemodialysis, morbid obesity and seizure disorder. She is being admitted for severe hypotension and bradycardia. Patient arrived from [facility] via EMS for bradycardia with heart rate as low as 30. She woke up around 4:15 this a.m., getting ready for her hemodialysis.  While at the dialysis center, she was found to have heart rate in the 30s and she could not get her dialysis. Her blood pressure was also low and EMS was called and she was transferred here to the Emergency Department.  As per patient, she was dialyzed on Tuesday and then she was dialyzed Wednesday also, as her kidney doctor felt she has too much fluid.  Then, she could not wake up this morning and when she woke up at 4:15, she started running for dialysis where this incident happened.  While in the ED, her heart rate was in 60s, but blood pressure was running in the low 70s and is being admitted for further evaluation and management. The patient reports not feeling well for the last two days and has been feeling lightheaded and dizzy and she almost passed out yesterday.   PAST MEDICAL HISTORY:  1.  Hypertension.  2.  End-stage renal disease, on hemodialysis Tuesday, Thursday, Saturday schedule.  3.  History of systolic congestive heart failure with ejection fraction less than 25% based on echo in 2013.  4.  Seizure disorder.  5.  Bradycardia with complete heart block, status post pacemaker placement in October 2013.   6.  History of asthma.  7.  Anemia of chronic disease.   8.  History of methicillin-resistant Staphylococcus aureus sepsis in April 2013.  9.   Depression.   PAST SURGICAL HISTORY:  1.  Pacemaker placement and automatic implantable cardiac defibrillator placement.  2.  Bilateral nephrectomy.  3.  Tonsillectomy.  4.  Cholecystectomy.   SOCIAL HISTORY: She lives at [facility]. No alcohol, smoking or IV drugs of abuse.   FAMILY HISTORY: Father with diabetes and history of strokes. Mother with hearing loss.   ALLERGIES: Contrast Dye, Iodine, MEDROL AND TRAZODONE ALONG WITH IODINE.    MEDICATIONS AT HOME:  1.  Advair 250/50 one puff b.i.d.  2.  Aspirin 81 mg p.o. daily.  3.  Aveeno topical lotion to affected area once a day.  4.  Bactroban 2% nasal ointment once a day. 5.  Cymbalta 60 mg p.o. daily.  6.  Dexilant 60 mg p.o. daily   7.  Doxycycline 100 mg p.o. every other day.  8.  Dulcolax 2 tablets p.o. at bedtime as needed.  9.  Ferrous sulfate 325 mg p.o. b.i.d.  10. Fexofenadine 180 mg p.o. daily.  11. Ibuprofen 800 mg p.o. every eight hours as needed.  12. Keppra 1 tablet on Monday, Wednesday, Friday and Sunday along with 1 tablet to equal 1500 mg total after dialysis on Tuesday, Thursday, Saturday for seizure.  13.  Lac-Hydrin 12% topical lotion twice a day to affected area.  14.  Lidoderm 5% topical film to affected area twice a day.  15.  MetroCream 0.75% to affected area twice a day.  16.  Midodrine 10 mg before each dialysis session.   REVIEW OF SYSTEMS:   CONSTITUTIONAL: No fever. Positive fatigue and weakness.  EYES: No blurred or double vision.  ENT: No tinnitus or ear pain.  RESPIRATORY: No wheezing, hemoptysis or cough. CARDIOVASCULAR: No chest pain, orthopnea, edema.  GASTROINTESTINAL:  No nausea, vomiting, diarrhea.   GENITOURINARY: No dysuria or hematuria.  Positive for anuria. ENDOCRINE:  No polyuria, nocturia.    HEMATOLOGY: Positive for anemia of chronic disease. No easy bruising or bleeding.  SKIN: No rash or lesion.  MUSCULOSKELETAL: Positive for arthritis.  NEUROLOGIC: No tingling,  numbness, weakness. Positive seizure disorder.  The patient does feel dizzy and lightheaded at times; almost passed out because of low blood pressure and possibly due to bradycardia.    PSYCHIATRIC:   No history of anxiety or depression.   PHYSICAL EXAMINATION:  VITAL SIGNS: Temperature 98, heart rate 60 per minute, respirations 18 per minute, blood pressure 73/61 mmHg.  She was saturating 97% on room air.  GENERAL: Pressure is a 59 year old morbidly obese female lying in the bed comfortably without any acute distress.  EYES: Pupils equal, round, and reactive to light and accommodation. No scleral icterus. Extraocular muscles intact.  HEENT:   Head atraumatic, normocephalic. Oropharynx and nasopharynx clear.  NECK: Supple. No jugular venous distention. No thyroid enlargement or tenderness.  LUNGS: Clear to auscultation bilaterally. No wheezes, rales, rhonchi or crepitation.  CARDIOVASCULAR: S1, S2 normal. No murmurs, rubs, or gallop.  ABDOMEN: Soft, nontender, nondistended, obese and no organomegaly appreciated.  EXTREMITIES: No pedal edema, cyanosis or clubbing.  Poor dorsalis pedis pulses palpable bilaterally.   SKIN: She has a tuberous sclerosis lesion present on the face.  These are chronic. No acute changes.  PSYCHIATRIC: The patient is awake and alert x3.   LABORATORY, DIAGNOSTIC, AND RADIOLOGICAL DATA: Sodium 136, potassium 4.6,  chloride 97, CO2 of 33, BUN 33, creatinine 4.18.  Normal first set of troponins.     Normal CBC except hemoglobin of 10.1, hematocrit 30.5.   Chest x-ray while in the ED showed no acute cardiopulmonary disease.   IMPRESSION AND PLAN:  1.  Hypotension, likely due to volume depletion with too much fluid removed during hemodialysis. She had received 2 hemodialysis sessions in a row on Tuesday and Wednesday and she was scheduled for one today also, which she could not get it due to hypotension and bradycardia.  Will monitor her in the Critical Care Unit stepdown  unit for now and start on IV hydration. We will increase her midodrine dose to 3 times a day and consult nephrology. She does not have any signs of infection at this point. We will hold off antibiotics. If She remains Hypotensive, may have to start pressors. 2.  End-stage renal disease, on hemostasis Tuesday, Thursday, Saturday.  We will consult Nephrology for dialysis need.  3.  Bradycardia, status post pacemaker and automatic implantable cardiac defibrillator in October 2013.  Her heart rate had been running low in the 30s as per patient and medical records while at dialysis. Will monitor her for now.  While in the Critical Care Unit, it has been running around  60s.  She was symptomatic with her low heart rate, with dizziness and lightheadedness. Right now, her heart rate seems stable in the 60s.  4.  History of chronic systolic heart failure with ejection fraction of less than 25%.  Will be cautious with IV hydration and will monitor very closely while in the CCU Stepdown unit. Her heart  failure seems very well compensated at this time.   CODE STATUS: Full code.   Total time taking care of this patient (Critical Care): 55 minutes.     ____________________________ Ellamae Sia. Sherryll Burger, MD vss:mw D: 01/01/2013 17:50:00 ET T: 01/01/2013 18:51:49 ET JOB#: [id number]  cc: Glenetta Borg, MD Felicidad Sugarman S. Sherryll Burger, MD, <Dictator> Ellamae Sia [facility] MD ELECTRONICALLY SIGNED 01/04/2013 15:11

## 2014-11-05 NOTE — H&P (Signed)
PATIENT NAME:  [name], [name] MR#:  [id number] DATE OF BIRTH:  Jul 07, 1956  DATE OF ADMISSION:  02/04/2013  PRIMARY CARE PHYSICIAN: Dan Humphreys, MD  CHIEF COMPLAINT: Hypotension.   HISTORY OF PRESENT ILLNESS: This is a 59 year old female who presented to the hospital today for a fistulogram on her left upper extremity due to low flow in the AV fistula. Prior to even getting the fistulogram, the patient was noted to be severely hypotensive with blood pressure in the 60s and 50s. She was alert, but lethargic. She received some IV fluids, about 500 mL, but her hypotension did not improve. Hospitalist services were contacted for further treatment and evaluation. As per the patient, she has been having increasing diarrhea for the past week, about 10 loose stools per day. She denies any fevers, any chills. She denies any abdominal pain. She denies any nausea, vomiting. Does admit to some vague chest pain and shortness of breath. Given her severe hypotension, hospitalist services were contacted for further treatment and evaluation.   REVIEW OF SYSTEMS:   CONSTITUTIONAL: No documented fever. No weight gain, no weight loss.  EYES: No blurred or double vision.  ENT: No tinnitus. No postnasal drip. No redness of the oropharynx.  RESPIRATORY: Positive cough. No wheeze. No hemoptysis. Positive dyspnea on exertion.  CARDIOVASCULAR: No chest pain, no orthopnea, no palpitations, no syncope.  GASTROINTESTINAL: No nausea, no vomiting. Positive diarrhea. No abdominal pain. No melena or hematochezia.  GENITOURINARY: No dysuria or hematuria.  ENDOCRINE: No polyuria or nocturia. No heat or cold intolerance.  HEMATOLOGIC: No anemia, no bruising, no bleeding.  INTEGUMENTARY: No rashes. No lesions.  MUSCULOSKELETAL: No arthritis. No swelling. No gout.  NEUROLOGIC: No numbness or tingling. No ataxia. No seizure-type activity.  PSYCHIATRIC: Positive anxiety. No insomnia. No ADD.   PAST MEDICAL HISTORY:  Consistent with:  1. End-stage renal disease, on hemodialysis Tuesday, Thursday, Saturday.  2. History of systolic congestive heart failure with EF of less than 25%.  3. Seizure disorder.  4. Hypertension.  5. History of sick sinus syndrome, status post pacemaker placement.  6. History of asthma.  7. Anemia of chronic disease.  8. History of MRSA sepsis.  9. Depression.   PAST SURGICAL HISTORY: Consistent with:  1. Pacemaker placement and AICD placement.  2. Bilateral nephrectomy.  3. Tonsillectomy.  4. Cholecystectomy.   SOCIAL HISTORY: No smoking. No alcohol abuse. No illicit drug abuse. Resides at [facility].   FAMILY HISTORY: Father has diabetes and history of stroke. Mother died from a car accident.   ALLERGIES: TO CONTRAST DYE, IODINE, MEDROL AND TRAZODONE.   CURRENT MEDICATIONS: As follows: 1. Iron sulfate 325 mg b.i.d.  2. Systane eyedrops b.i.d.  3. Symbicort 160/4.5 one puff b.i.d. 4. Dexilant 60 mg daily. 5. Midodrine 10 mg on the days of dialysis. 6. Cymbalta 60 mg daily.  7. Fexofenadine 180 mg daily.  8. PhosLo 667 mg 2 caps t.i.d. with meals.  9. Keppra 1000 mg daily on the days Monday, Wednesday, Friday and Sunday and 1500 mg on Tuesday, Thursday and Saturday.  10. Doxycycline 100 mg every other day.  11. Primidone 125 mg at bedtime. 12. Renvela 800 mg at bedtime.  13. Aspirin 81 mg daily. 14. MiraLax daily as needed. 15. Senokot 2 tabs b.i.d. as needed. 16. Topamax 200 mg b.i.d.  17. Norco 5/325 one tab q.6 hours as needed. 18. Tylenol 650 q.6 hours as needed. 19. Xanax 0.25 mg b.i.d. as needed. 20. Dulcolax at bedtime as needed.  21. Seroquel 12.5 mg daily.   PHYSICAL EXAMINATION: Presently, is as follows: VITAL SIGNS: Temperature: She is afebrile. Pulse in the 60s, respirations 18, blood pressure 82/43, sats is 99% on 2 liters nasal cannula.  GENERAL: She is a pleasant-appearing female, lethargic, but in no apparent distress.  HEAD, EYES, EARS,  NOSE AND THROAT: She is atraumatic, normocephalic. Her extraocular muscles are intact. Her pupils are equal and reactive to light. Her sclerae are anicteric. No conjunctival injection. No pharyngeal erythema.  NECK: Supple. There is no jugular venous distention, no bruits, no lymphadenopathy, no thyromegaly.  HEART: Regular rate and rhythm. No murmurs. No rubs. No clicks.  LUNGS: Clear to auscultation bilaterally. No rales or rhonchi. No wheezes.  ABDOMEN: Soft, flat, nontender, nondistended. Has good bowel sounds. No hepatosplenomegaly appreciated.  EXTREMITIES: No evidence of any cyanosis, clubbing or peripheral edema. Has +2 pedal and radial pulses bilaterally. She does have a left upper extremity fistula which has a good bruit and good thrill, with no drainage and no redness.  NEUROLOGIC: The patient is alert, awake and oriented x3 with no focal motor or sensory deficits appreciated bilaterally.  SKIN: Moist and warm with no rash appreciated.  LYMPHATIC: There is no cervical or axillary lymphadenopathy.   LABORATORY: Currently pending.   ASSESSMENT AND PLAN: This is a 59 year old female with history of end-stage renal disease, on hemodialysis, sick sinus syndrome, status post pacemaker, chronic hypotension, history of seizures, chronic obstructive pulmonary disease, anxiety, history of methicillin-resistant Staphylococcus aureus sepsis, who presents to the hospital for an elective fistulogram due to low flow at dialysis. Prior to the fistulogram, the patient was noted to be severely hypotensive and in shock.   1. Shock. The patient has severe hypotension with systolic blood pressures in the 60s to 70s. The exact etiology of this is unclear, whether this is hypovolemic from her ongoing diarrhea or septic shock. She has received some IV fluids here in the special procedures recovery, but her hypotension has not improved. I will start the patient on some low-dose dopamine. Also empirically start the  patient on IV vancomycin and Zosyn. Blood cultures have been obtained and will follow it up. She will be admitted to the Intensive Care Unit and will follow her hemodynamics. I will also continue her midodrine as she has chronic hypotension.  2. End-stage renal disease, on hemodialysis. Will consult nephrology. Continue dialysis on Monday, Wednesday, Friday as tolerated.  3. History of seizures. No acute seizure-type activity. Continue Topamax, Keppra and primidone.  4. Chronic obstructive pulmonary disease. No evidence of an acute COPD exacerbation. Continue Symbicort. Continue p.r.n. DuoNebs.  5. Anxiety. Continue p.r.n. Xanax.  6. Depression. Continue Cymbalta.  7. Acute diarrhea, exact etiology unclear. I will check stool for C. difficile and comprehensive culture.   CODE STATUS: The patient is a full code.   CRITICAL CARE TIME SPENT: 50 minutes.   ____________________________ Belia Heman. Verdell Carmine, MD vjs:OSi D: 02/04/2013 12:04:24 ET T: 02/04/2013 12:32:18 ET JOB#: [id number]  cc: Belia Heman. Verdell Carmine, MD, <Dictator> Henreitta Leber MD ELECTRONICALLY SIGNED 02/14/2013 15:18

## 2014-11-05 NOTE — Discharge Summary (Signed)
PATIENT NAME:  [name], [name] MR#:  [id number] DATE OF BIRTH:  1955-09-11  DATE OF ADMISSION:  01/01/2013 DATE OF DISCHARGE:    CONSULTANTS: Nephrology with Dr. Juleen China and Dr. Candiss Norse, cardiology with Dr. Ubaldo Glassing.   CHIEF COMPLAINT: Low blood pressure.   PRIMARY CARE PHYSICIAN: Dr. Inez Pilgrim at [facility].   DISCHARGE DIAGNOSES: 1.  Low blood pressure likely dehydration with excessive fluid removal at dialysis.  2.  Bradycardia with failure of pacemaker to capture and this was adjusted. The patient needs pacemaker to be revised as an outpatient.  3.  End-stage renal disease on dialysis Tuesdays, Thursdays, Saturdays.  4.  Anemia of chronic disease.  5.  Compensated systolic heart failure with ejection fraction of less than 25%.  6.  History of hypertension.  7.  History of seizure disorder.  8.  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  9.  History of asthma.  10.  Anemia of chronic disease.  11.  History of MRSA, sepsis in April 2013. 12.  Depression.   DISCHARGE MEDICATIONS: Keppra 1 tab once a day on Monday, Wednesday, Friday and Sunday and 1500 mg on dialysis days. Advair 250/50 mcg inhaled 1 puff 2 times a day, Cymbalta 60 mg daily, fexofenadine 180 mg daily, Topamax 200 mg 3 times a day, primidone 125 mg daily, Renvela  800 mg 1 tab at bedtime, aspirin 81 mg daily, Tylenol 325 mg 2 tabs every 6 hours as needed for pain, Dulcolax 5 mg 2 tabs once a day at bedtime p.r.n., Senokot-S 50/8.6 mg 2 tabs 2 times a day, PhosLo 667mg  two caps 3 times a day, Peridex 0.12 mg swish and spit 10 mL 2 times a day, MiraLAX 17 grams 2 times a day, Dexilant  60 mg a day, Systane ophthalmic solution one drop each eye 2 times a day, Seroquel 25 mg at bedtime, Preparation H every 5 hours p.r.n. rectally, Xanax 0.25 mg 2 times a day as needed for anxiety, ferrous sulfate 325 mg 2 times a day, doxycycline 100 mg every other day, triamcinolone topical apply once a day to  affected area, Bactroban 2% nasal ointment applying once a day, Lac-Hydrin 12% topical lotion apply 2 times a day, Renvela  800 mg 2 tabs 3 times a day, Norco 325/5 mg 1 tab every six hours as needed for pain.   DIET: Renal diet.   ACTIVITY: As tolerated.   FOLLOWUP: Please follow with PCP and cardiologist within 1 to 2 weeks. CPAP at night for your obstructive sleep apnea.   DISPOSITION: Back to [facility].   CODE STATUS:  The patient is full code.   HISTORY OF PRESENT ILLNESS AND HOSPITAL COURSE: For full details of H and P, please see the dictation from June 19 by Dr. Manuella Ghazi, but briefly this is a 59 year old obese Caucasian female with seizure disorder, who was admitted for bradycardia low heart rate and also hypertension was admitted to the hospitalist service in the CCU. Her blood pressure was running in the systolic 81W. She was admitted to the hospitalist service and given some gentle fluids. Hypotension is likely secondary to volume depletion, as too much fluid was removed likely during hemodialysis. She underwent dialysis successfully. She was also noted to have bradycardia and it appeared that her permanent pacemaker was not capturing every beat and she was having fib/flutter with brady episodes. This was adjusted per cardiology and she was seen by Dr. Ubaldo Glassing. She has had a pacemaker placed  last year for complete heart block. At this point, the pacemaker is capturing appropriately per cardiology. Per cardiology, the pacemaker needs to be considered for a lead revision at some point, but at this point is okay to discharge on current medications and she will be discharged. She had a pacemaker at [facility] and she is to follow up there for the revision. Her next dialysis day is on Tuesday.    TOTAL TIME SPENT: 35 minutes.   ____________________________ Vivien Presto, MD sa:cc D: 01/04/2013 12:45:52 ET T: 01/04/2013 14:17:32 ET JOB#: [id number]  cc: Vivien Presto, MD,  <Dictator> Dan Humphreys, MD Vivien Presto MD ELECTRONICALLY SIGNED 01/11/2013 22:16

## 2014-11-05 NOTE — Op Note (Signed)
PATIENT NAME:  [name], [name] MR#:  [id number] DATE OF BIRTH:  [birth year]  DATE OF PROCEDURE:  03/26/2013  PREOPERATIVE DIAGNOSES 1.  End-stage renal disease.  2.  Central venous occlusion.  3.  Clotted left upper arm arteriovenous graft.  4.  Morbid obesity.  POSTOPERATIVE DIAGNOSES 1.  End-stage renal disease.  2.  Central venous occlusion.  3.  Clotted left upper arm arteriovenous graft.  4.  Morbid obesity.  PROCEDURES 1.  Ultrasound guidance for vascular access to AV graft in both an antegrade and retrograde fashion.  2.  Left upper extremity shuntogram and central venogram.  3.  Catheter-directed thrombolysis with 4 mg of TPA delivered with the AngioJet AVX catheter to the AV graft and axillary and subclavian veins.   4.  Mechanical rheolytic thrombectomy of her same area with the AngioJet AVX catheter.  5.  Percutaneous transluminal angioplasty of venous anastomosis mid and distal portion of AV graft with 7 mm diameter angioplasty balloon.   SURGEON: Algernon Huxley, M.D.   ANESTHESIA: Local with moderate conscious sedation.   ESTIMATED BLOOD LOSS: 25 mL.   INDICATION FOR PROCEDURE: This is a patient known to our service with previous dialysis access needs, presents with clotted graft. We are trying to salvage this. She has very limited access options and has a known superior vena cava occlusion.   DESCRIPTION OF PROCEDURE: The patient's left upper extremity was sterilely prepped and draped, and a sterile surgical field was created. The graft was accessed in both an antegrade and retrograde fashion under direct ultrasound guidance without difficulty with a micropuncture needle and a permanent image was recorded. A micropuncture wire and sheath were placed and we upsized to 6-French sheaths. The patient was systemically heparinized. Catheter-directed thrombolysis was performed throughout the AV graft and into the axillary and subclavian veins where thrombus was present. Then 4 mg  of TPA was delivered with the AngioJet AVX catheter. Following delivery, there was a stenosis at the venous anastomosis and residual areas of waist with possible stenosis or thrombosis within the AV graft and residual thrombus within the axillary and subclavian veins. Mechanical rheolytic thrombectomy was performed and in the venous anastomosis, mid and distal portions of the AV graft were all treated with 7 mm diameter angioplasty balloon. Waists were taken which resolved with angioplasty. With the balloon inflated, we were able to pass by the arterial anastomosis which appeared to be patent. Completion angiography following the above interventions showed the graft not to be patent. The thrombus that was is in the axillary and subclavian veins had largely gone out collateral branches and these were now opened down to a known occlusion of the innominate vein which was then drained through large collaterals into the azygos and hemiazygos systems. This was similar to her findings several months ago. At this point, we elected to terminate the procedure. The sheaths were removed with 4-0 Monocryl pursestring sutures. Pressure was held. Sterile dressing was placed. The patient tolerated the procedure well and was taken to the recovery room in stable condition.     ____________________________ Algernon Huxley, MD jsd:cs D: 03/30/2013 16:17:53 ET T: 03/30/2013 18:54:43 ET JOB#: [id number]  cc: Algernon Huxley, MD, <Dictator> Algernon Huxley MD ELECTRONICALLY SIGNED 04/08/2013 11:42

## 2014-11-05 NOTE — Op Note (Signed)
PATIENT NAME:  [name], [name] MR#:  [id number] DATE OF BIRTH:  03/13/1956  DATE OF PROCEDURE:  07/21/2012  PREOPERATIVE DIAGNOSES: 1.  End-stage renal disease.  2.  Poorly functioning left forearm arteriovenous graft.  3.  Morbid obesity.  4.  Multiple failed previous dialysis access attempts. 5.  Superior vena cava syndrome.   POSTOPERATIVE DIAGNOSIS:  1.  End-stage renal disease.  2.  Poorly functioning left forearm arteriovenous graft.  3.  Morbid obesity.  4.  Multiple failed previous dialysis access attempts. 5.  Superior vena cava syndrome.   PROCEDURES: 1.  Ultrasound guidance for vascular access to left brachial artery axillary vein arteriovenous graft.  2.  Left upper extremity shuntogram and central venogram.   SURGEON: Algernon Huxley, M.D.   ANESTHESIA: Local with moderate conscious sedation.   ESTIMATED BLOOD LOSS: 25 mL.   INDICATION FOR PROCEDURE: A 59 year old white female with end-stage renal disease.  She has decreased flow in her left arm AV graft. We are asked to evaluate this.  She has a previous history of superior vena cava syndrome and very limited access options. It was known that there were central venous issues, but this was felt to be the best option she had as this was extremely well collateralized on the left.   DESCRIPTION OF PROCEDURE: The patient's left upper extremity was sterilely prepped and draped and a sterile surgical field was created. The graft was accessed just a few centimeters beyond the anastomosis of the artery to the graft with a micro puncture needle.  A micro puncture wire and sheath were then placed and a permanent image was recorded for the medical record.  Imaging was then performed.  The graft itself appeared patent. The venous anastomosis was patent without significant stenosis. The axillary vein was patent to the subclavian vein which then occluded and I never saw reconstitution of the superior vena cava. There were very large  well-developed collaterals present that appeared to be draining directly to the atrium. I did upsize to a 6 [place] sheath, gave a small dose of intravenous heparin and spent several minutes trying to cross the central venous occlusion with no success whatsoever and no likelihood of success. Previous attempts had also been on performed by Dr. Delana Meyer who was also unsuccessful and was clearly not going to be a lesion that was amenable to revascularization. At this point I elected to terminate the procedure. The sheath was removed around a 4-0 Monocryl pursestring suture. Pressure was held. Sterile dressing was placed. The patient tolerated the procedure well and was taken to the recovery room in stable condition.     ____________________________ Algernon Huxley, MD jsd:ct D: 07/21/2012 15:10:08 ET T: 07/22/2012 07:23:03 ET JOB#: [id number]  cc: Algernon Huxley, MD, <Dictator> Algernon Huxley MD ELECTRONICALLY SIGNED 07/25/2012 8:30

## 2014-11-05 NOTE — Consult Note (Signed)
   Present Illness 59 yo female with history of esrd on hemodialysis, history of chb with ppm placed in the left abdomen with epicardial lead placement. She was admitted after being noted to have bradycardia on ekg at telemetry. She also has morbid obesity, and aparent petit mal seizures. She was noted to have aflutter on ekg with intermittant capture on telemetry with hypotension. Inerogation of the device reveals high lead thresholds.   Physical Exam:  GEN obese   HEENT PERRL, hearing intact to voice   NECK supple   RESP normal resp effort  rhonchi   CARD Irregular rate and rhythm  Bradycardic  No murmur   ABD denies tenderness  normal BS   LYMPH negative neck, negative axillae   EXTR negative cyanosis/clubbing, positive edema   SKIN normal to palpation   NEURO cranial nerves intact, motor/sensory function intact   PSYCH alert, anxious   Review of Systems:  Subjective/Chief Complaint weakness   General: Fatigue  Weakness   Skin: No Complaints   ENT: No Complaints   Eyes: No Complaints   Neck: No Complaints   Respiratory: Short of breath   Cardiovascular: Edema   Gastrointestinal: No Complaints   Genitourinary: No Complaints   Vascular: No Complaints   Musculoskeletal: No Complaints   Neurologic: No Complaints   Hematologic: No Complaints   Endocrine: No Complaints   Psychiatric: No Complaints   Review of Systems: All other systems were reviewed and found to be negative   Medications/Allergies Reviewed Medications/Allergies reviewed     Dialysis:    CHF:    COPD:    Anemia:    Renal Failure:    MRSA within past 6 months: Apr 2013   Hernia:    seizures:   EKG:  Interpretation aflutter with pacer spikes and intermittant capture    Iodine: Hives  Medrol: Hives  Contrast - Iodinated Radiocontrast Dye: Hives  Trazodone: Unknown   Impression Pt iwth aflutter with history of complete heart block with underlying excape rhythm at  approximately30 beats per minute and hypotension. Device interogation reveals high lead impeadance. Device was reprogrammed to 7.5 V and 1.5 ms pulse width. Rate at 60 bpm. Now with 100% capture. Would proceed iwth dialysis and furhter treatment of her comorbid conditions. Will set up outpatient appt with [facility] regarding revision of pacer lead. Pt does not need urgernt revision at this point.   Plan 1. COnitnue with current meds 2. Hemodialysis 3. Follow heart rate and blood pressure   Electronic Signatures: Teodoro Spray (MD)  (Signed 20-Jun-14 09:57)  Authored: General Aspect/Present Illness, History and Physical Exam, Review of System, Past Medical History, EKG , Allergies, Impression/Plan   Last Updated: 20-Jun-14 09:57 by Teodoro Spray (MD)

## 2014-11-05 NOTE — Discharge Summary (Signed)
PATIENT NAME:  [name], [name] MR#:  [id number] DATE OF BIRTH:  1956/02/28  DATE OF ADMISSION:  02/04/2013 DATE OF DISCHARGE:  02/09/2013  DISCHARGE DIAGNOSES: 1.   Sepsis, due to Clostridium difficile, needed intravenous Solu-Cortef and dopamine drip; appears to be stable now.  2.  Malfunctioning of hemodialysis access. This was the main cause she was sent to the ER, possibly due to shock as blood pressure was low; when came in after improving blood pressure it functioned fine in hospital twice.  3.  Clostridium difficile infection finish a total 10 days of treatment.  4.  End-stage renal disease, on hemodialysis.  5.  Chronic diastolic congestive heart failure.  6.  Obesity.  7.  Obstructive sleep apnea.  8.  Depression.  9.  Chronic obstructive pulmonary disease.  10.  Anxiety.   CODE STATUS: FULL CODE.   DISCHARGE MEDICATIONS: 1.  Keppra 1000 mg oral tablet once a day.  2.  Advair 1 puff 2 times a day.  3.  Fexofenadine 180 mg oral tablet once a day.  4.  Topamax 200 mg 2 times a day.  5.  Renvela 800 mg once a day.  6.  Aspirin 81 mg once a day.  7.  Tylenol 325 mg 2 tablets every 6 hours as needed for pain.  8.  Phos-Lo  667 mg oral capsule 3 times a day.  9.  MiraLAX oral powder 2 times a day.  10.  Xanax 0.25 mg oral tablet 2 times a day as needed for anxiety.  11.  Ferrous sulfate 325 mg oral tablet 2 times a day.  12.  Norco 1 tablet every 6 hours for mild to moderate pain.  13.  Prednisone 10 mg oral tablets, start 60 mg and taper x 10 mg until complete.  14.  Metronidazole 500 mg oral tablet every 8 hours for 10 days.   DIET: Low-sodium, low-fat, renal diet.   DIET CONSISTENCY: Regular.   ACTIVITY: As tolerated.   Timeframe to follow up 1 to 2 weeks with Dr. Lucky Cowboy.   HISTORY OF PRESENTATION: [name] who presented to the hospital for a fistulogram on the left upper extremity due to low blood flow in AV fistula at hemodialysis center, but before  getting the fistulogram she was noted having hypotension. Blood pressure was running systolic 17P and 10C, and the patient was lethargic. She received IV fluid 500 mL, but hypotension did not improve and so she was admitted for further management. On questioning, she had a complaint of diarrhea and loose stool for a, week but there was no fever, chills, or abdominal pain. There was no nausea or vomiting.   Due to hypotension she was admitted to the CCU with a dopamine drip and blood pressure was maintained in range. Blood culture remained negative. Started on broad-spectrum antibiotic initially for possible infection, but all the sources remained negative for infection. After 2 days we were able to successfully get a sample of the stool, and then the patient taken to dialysis in hospital and she tolerated very well through the same access when the patient maintained with dopamine, and later on dopamine was discontinued due to her blood pressure remaining in acceptable range, and her stool samples reported positive for C. diff., so she was started on treatment. Her blood pressure remained stable after that without any dopamine or help of IV fluid. Her hemodialysis access is working fine and blood pressure is remaining more than 80 or 90 systolic, and  which remains fine, so after discussion with nephrologist and vascular surgeon we came to the conclusion that she does not need a fistulogram while in the hospital and she can just finish a course of C. diff. which might be the underlying cause of her hypotension and not working with her low flow through her access.  Her other medical issues addressed during this hospital stay:   1.  End-stage renal disease, hemodialysis on Monday, Wednesday, Friday, malfunctioning with low flow to dialysis access. This was due to hypertension and her blood pressure came under control. She was able to use the fistula without any problem, and her blood flow is sufficient for  hemodialysis as per nephrologist.  2.  History of seizure: We continued Topamax, Keppra and Primidone.  3.  Chronic obstructive pulmonary disease: Stable in the hospital; no exacerbation.  4.  Obstructive sleep apnea; CPAP at night.  5.  Anxiety: Continue Xanax as needed.  6.  Depression: Continue Cymbalta.  IMPORTANT LABORATORY RESULTS IN THE HOSPITAL: Blood cultures were negative. Hemoglobin 9.3 on presentation. Total white cell count 4.2, and platelets 153, MCV 104.   Creatinine was 4.98 and potassium was 4.4.   Chest x-ray portable, on admission: No acute disease of the chest. White cell count and  hemoglobin remained stable in the hospital:   Echocardiogram showed ejection fraction of 55% to 60%, normal global left ventricular systolic function, impaired relaxation pattern.   Stool cultures: Stool positive for C. diff.   Total time spent on this discharge: Forty-five minutes     ____________________________ [name]. [name] Jungling, MD vgv:dm D: 02/09/2013 14:24:17 ET T: 02/09/2013 15:03:50 ET JOB#: [id number]  cc: [name]. [name] Jungling, MD, <Dictator> Vaughan Basta MD ELECTRONICALLY SIGNED 02/17/2013 23:18

## 2014-11-05 NOTE — Op Note (Signed)
PATIENT NAME:  [name], [name] MR#:  [id number] DATE OF BIRTH:  06-05-1956  DATE OF PROCEDURE:  02/04/2013  PREOPERATIVE DIAGNOSIS: Poor intravenous access and hypotension.   POSTOPERATIVE DIAGNOSIS: Poor intravenous access and hypotension.  PROCEDURE PERFORMED: Ultrasound-guided left femoral vein central venous catheter placement.   ESTIMATED BLOOD LOSS: 5 mL.   COMPLICATIONS: None.   SPECIMENS: None.   ANESTHESIA: Lidocaine 1% with epinephrine.   INDICATION FOR PROCEDURE: [name] is a pleasant 59 year old female with history of end-stage renal disease and multiple vascular procedures. She also has SVC syndrome. I was consulted for hypotension and poor IV access and therefore elected to place a central venous catheter.   DETAILS OF PROCEDURE:   Informed consent was obtained. Her left groin was then prepped and standard surgical fashion. A timeout was then performed correctly identifying the patient name, operative site and procedure to be performed. Her femoral vein was visualized. With a single stick of the access needle, her vein was accessed. A wire was placed through the needle. The needle was removed. The tract was dilated out. A triple-lumen catheter was then placed over the wire. The wire was then removed. All three ports were flushed and drew easily. The catheter was sutured in place. Sterile dressing was placed over the wound. There were no immediate complications. Needle, sponge, and instrument counts were correct at the end of the procedure.   ____________________________ Glena Norfolk. Aretta Stetzel, MD cal:aw D: 02/05/2013 10:21:00 ET T: 02/05/2013 10:49:56 ET JOB#: [id number]  cc: Harrell Gave A. Emaleigh Guimond, MD, <Dictator> Floyde Parkins MD ELECTRONICALLY SIGNED 02/07/2013 11:32

## 2014-11-06 NOTE — Op Note (Signed)
PATIENT NAME:  [name], SAGRERO MR#:  [id number] DATE OF BIRTH:  March 09, 1956  DATE OF PROCEDURE:  11/18/2013  PREOPERATIVE DIAGNOSIS: End-stage renal disease with functional permanent dialysis access and no longer needing PermCath.   POSTOPERATIVE DIAGNOSIS: End-stage renal disease with functional permanent dialysis access longer needing PermCath.   PROCEDURE: Removal of left femoral PermCath.   SURGEON: Melvyn Neth, PA-C   ANESTHESIA: Local.   ESTIMATED BLOOD LOSS: Minimal.   INDICATION FOR THE PROCEDURE: The patient is a 59 year old female with end-stage renal disease. Her access is functional and she no longer needs PermCath; therefore, this will be removed.   DESCRIPTION OF THE PROCEDURE: The patient is brought to the vascular interventional radiology area and positioned supine. The left femoral, groin area and existing catheter were sterilely prepped and draped and a sterile surgical field was created. The area was locally anesthetized copiously with 1% lidocaine. Hemostats were used to help dissect out the cuff and an 11 blade was used to transect the fibrous sheath connected to the cuff. The catheter was then removed in its entirety without difficulty with gentle traction. Pressure was held. Sterile dressing was placed. The patient tolerated the procedure well.   ____________________________ Marin Shutter. Susanne Baumgarner, PA-C cnh:lf D: 11/18/2013 08:46:46 ET T: 11/18/2013 10:08:34 ET JOB#: [id number]  cc: Marin Shutter. Harpreet Pompey, PA-C, <Dictator> [place] PA ELECTRONICALLY SIGNED 11/27/2013 8:59

## 2014-11-06 NOTE — Discharge Summary (Signed)
PATIENT NAME:  [name], [name] MR#:  [id number] DATE OF BIRTH:  1955/10/19  DATE OF ADMISSION:  06/30/2014 DATE OF DISCHARGE:  07/01/2014  DISPOSITION: [facility]   PRESENTING COMPLAINT: Hypotension.   DISCHARGE DIAGNOSES: 1. Acute on chronic hypotension.   2. End-stage renal disease on hemodialysis.  3. History of seizure disorder.  4. History of tuberous sclerosis.  5. History of seizure disorder.  6. Depression.  7. Asthma.  8. Anemia of chronic kidney disease.   CONDITION ON DISCHARGE: Fair. Her blood pressure remained anywhere from systolic 58 to systolic 95. Pulse is 60. Sats are 95% on 2 liters. The patient uses oxygen 2 liters nasal cannula as needed to keep sats greater than 92%.   LABORATORY DATA: White count is 6.4, H and H is 9.3 and 29.8, platelet count is 135. Potassium is 4.5. Cardiac enzymes, 1st set negative.   VASCULAR CONSULTATION: Dr. Lucky Cowboy.   PROCEDURES PERFORMED: Ultrasound guidance, vascular access through right thigh AV graft, right thigh shuntogram, and central venogram. The patient tolerated the procedure well. No occlusion noted.   BRIEF SUMMARY OF HOSPITAL COURSE:  1. [name] is a 59 year old obese Caucasian female with history of end-stage renal disease on hemodialysis, history of tuberous sclerosis, depression, chronic hypotension, comes into the Emergency Room from hemodialysis after she completed the dialysis on the day of admission, rolled over, and they thought the patient had passed out. The patient was conscious all the time, came in with blood pressure in the systolic 89F. She was admitted, received some IV boluses of fluid. Blood pressure stayed anywhere from systolic 54 to systolic 81O. The patient remained asymptomatic. She is put back on her midodrine 10 mg t.i.d. and fludrocortisone. I am not sure if the patient has been compliant or are there issues with her taking medications at [facility]. She  was seen by physical therapy and recommended rehab, which has been continued. A front-wheeled walker has been prescribed.  2. End-stage renal disease on hemodialysis. Underwent angiogram, per Dr. Lucky Cowboy, to check patency in her right thigh, which looks okay. Tolerating  hemodialysis through her right thigh AV graft.  3. Chronic obstructive pulmonary disease/asthma. Continue home meds. Use oxygen as needed Gastroesophageal reflux disease. On PPI.  4. Depression, anxiety. Continue Zoloft and Xanax.  5. History of hemorrhoids. The patient is continued on her local bowel preps, and she will follow up with her GI as outpatient.   Hospital stay otherwise remained stable.   CODE STATUS: She remained a full code.   TIME SPENT: 40 minutes    ____________________________ Gus Height A. Posey Pronto, MD sap:mw D: 07/01/2014 12:52:00 ET T: 07/01/2014 13:10:28 ET JOB#: [id number]  cc: Anda Sobotta A. Posey Pronto, MD, <Dictator> Ilda Basset MD ELECTRONICALLY SIGNED 07/09/2014 17:03

## 2014-11-06 NOTE — H&P (Signed)
PATIENT NAME:  [name], TAPE MR#:  [id number] DATE OF BIRTH:  June 13, 1956  DATE OF ADMISSION:  09/09/2013  PRIMARY CARE PHYSICIAN:  Dr. Darryll Capers from [place].   REQUESTING PHYSICIAN:  Dr. Lenard Lance, in the ED.   CHIEF COMPLAINT:  Hypotension.  HISTORY OF PRESENT ILLNESS:  The patient is a 59 year old female who was here earlier for special procedure for vascular declotting with Dr. Gilda Crease.  She was found to be hypotensive and was requested to come to the Emergency Department.  Her pressure was reported to be in 70s.  While in the ED she reports having abdominal pain and on and off diarrhea for the last two weeks since her discharge from hospital on February 11th.  Her pain is also diffuse, but mainly in the lower area.  She denies any nausea or vomiting.  She does report having bright red blood per rectum, but at times it can be dark and she feels it could be from her hemorrhoids that she has had it.  She was scheduled to get a vascular declotting today, but because of hypotension this was canceled and she is being admitted for further evaluation and management.   PAST MEDICAL HISTORY: 1.  End-stage renal disease on hemodialysis, Tuesday, Thursday, Saturday.  2.  Frequent clotting and occlusion of the dialysis access.  3.  Chronic systolic CHF with EF of 25%.  4.  Seizure disorder.   5.  Sick sinus syndrome, status post pacemaker.  6.  Asthma.  7.  Anemia of chronic disease.  8.  Depression.  9.  Hemorrhoids.  10.  Chronic hypotension.   PAST SURGICAL HISTORY:   1.  Pacemaker placement and AICD.  2.  Bilateral nephrectomy.  3.  Tonsillectomy. 4.  Cholecystectomy.   ALLERGIES TO MEDICATIONS:   1.  IV CONTRAST DYE.  2.  IODINE.  3.  MEDROL.  4.  TRAZODONE.   SOCIAL HISTORY:  She is a resident at [facility], usually wheelchair bound.  No history of smoking or alcohol use.   FAMILY HISTORY:  Positive for diabetes and stroke.  Mother died from car accident.    MEDICATIONS AT HOME:  1.  Bactroban 2% nasal ointment twice a day.  2.  Bactroban topical ointment lips or around mouth twice a day.  3.  Biotin mouthwash 10 mL 3 times a day and as needed.  4.  Doxycycline 100 mg by mouth every other day.  5.  Dulcolax 2 tablets by mouth at bedtime as needed.  6.  Ferrous sulfate 325 mg by mouth twice daily.  7.  Fexofenadine 180 mg by mouth daily.  8.  Fludrocortisone 0.1 mg 4 tablets by mouth daily.  9.  Keppra 1000 mg by mouth every Tuesday, Thursday, Saturday along with 500 mg every Monday, Wednesday, Friday.  10.  Lamictal 25 mg by mouth at bedtime.  11.  Levetiracetam 500 mg every Tuesday, Thursday, Saturday with each hemodialysis.  12.  [place] cream topical to face twice a day.  13.  Midodrine 10 mg by mouth 3 times a day.  14.  MiraLAX once daily.  15.  Norco 325/5 mg 1 tablet by mouth every six hours as needed.  16.  PhosLo 667 mg 3 capsules by mouth twice daily and 3 capsules at 11:30 a.m. on Monday, Wednesday, Friday and Saturday.  17.  Xanax 0.25 mg by mouth twice daily as needed and 0.5 mg by mouth before going to dialysis on Tuesdays, Thursday, Saturday. 18.  Primidone 250 mg 1/2 tablet by mouth at bedtime.  19.  Renvela per nephrology.  20.  Symbicort 2 puffs inhaled twice a day.  21. Zoloft 50 mg by mouth daily.  22.  Topamax 200 mg by mouth twice daily.    REVIEW OF SYSTEMS:  CONSTITUTIONAL:  No fever.  Positive for fatigue and weakness.  EYES:  No blurred or double vision.  EARS, NOSE, THROAT:  No tinnitus or ear pain.  RESPIRATORY:  No cough, wheeze, hemoptysis.  CARDIOVASCULAR:  No chest pain, orthopnea, edema.  GASTROINTESTINAL:  Positive for nausea, abdominal pain and diarrhea along with bright red blood per rectum.  GENITOURINARY:  No dysuria or hematuria. ENDOCRINE:  No  polyuria or nocturia. HEMATOLOGY:  Anemia of chronic disease.  No easy bruising. SKIN:  No rash or lesion.  MUSCULOSKELETAL:  She has chronic back pain.   NEUROLOGIC:  No tingling, numbness, weakness.  History of seizure.  PSYCHIATRIC:  No history of anxiety or depression.   PHYSICAL EXAMINATION: VITAL SIGNS:  Temperature 97.9, heart rate 60 per minute, respirations 20 per minute, blood pressure 87/44 mmHg.  She is saturating 99% on room air.  GENERAL:  The patient is a 59 year old female lying in the bed comfortably without any acute distress.  EYES:  Pupils equal, round, reactive to light and accommodation.  No scleral icterus.  Extraocular muscles intact.  HEENT:  Head atraumatic, normocephalic.  Oropharynx and nasopharynx clear.  NECK:  Supple.  No jugular venous distention.  No thyroid enlargement or tenderness.  LUNGS:  Clear to auscultation bilaterally.  No wheezes, rales, rhonchi or crepitation.  CARDIOVASCULAR:  S1, S2 normal.  No murmurs, rubs, or gallop.  ABDOMEN:  Soft, minimally tender in the lower abdominal region.  No guarding or rigidity.  No organomegaly appreciated.  EXTREMITIES:  No pedal edema, cyanosis or clubbing.  NEUROLOGIC:  Nonfocal examination.  Cranial nerves II through XII intact.  Muscle strength 5 out of 5 in all extremities.  Sensation intact.  She has a left leg access and a right leg graft.   PSYCHIATRIC:  The patient is awake, alert, oriented x 3.   LABORATORY DATA:  Sodium 132, potassium 4.3, chloride 97, CO2 31, BUN 30, creatinine 0.31, blood sugar 74.  LFTs showed alkaline phosphatase 130.  Normal liver function tests otherwise.  Normal troponins and cardiac enzymes.  CBC showed a white count of 4.1, hemoglobin 6.5, hematocrit 20.4, platelets 175.    Chest x-ray in the ED showed no acute cardiopulmonary disease.    CT scan of the abdomen and pelvis showed no acute abnormalities.   IMPRESSION AND PLAN: 1.  Acute blood loss anemia, likely due to gastrointestinal bleed.  We will consult gastroenterology and start her on Protonix drip, status post 1 packed red blood cell transfusion.  We will recheck  hemoglobin and hematocrit in the morning.  2.  Gastrointestinal bleed, as managed above. 3.  Hypotension, likely due to gastrointestinal bleed.  Aggressive IV hydration and monitor.  May need pressors for which we will monitor on the stepdown unit.  4.  End stage renal disease, on hemodialysis.  We will consult nephrology for further dialysis needs.  5.  Diarrhea.  We will obtain stool studies.  Abdominal pain is likely from ongoing gastrointestinal etiology.  6.  CODE STATUS:  FULL CODE.  Total time taking care of this patient is 55 minutes.      ____________________________ Ellamae Sia. Sherryll Burger, MD vss:ea D: 09/09/2013 69:62:95 ET T: 09/09/2013 23:01:48  ET JOB#: [id number]  cc: Vance Belcourt S. Sherryll Burger, MD, <Dictator> Dr. Darryll Capers in [facility] with [facility] Ellamae Sia [facility] MD ELECTRONICALLY SIGNED 09/16/2013 21:36

## 2014-11-06 NOTE — Op Note (Signed)
PATIENT NAME:  [name], [name] MR#:  [id number] DATE OF BIRTH:  10-21-1955  DATE OF PROCEDURE:  06/24/2013  PREOPERATIVE DIAGNOSES: 1.  End-stage renal disease requiring hemodialysis.  2.  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  3.  Complication of arteriovenous vascular device with thrombosis of graft.   POSTOPERATIVE DIAGNOSES: 1.  End-stage renal disease requiring hemodialysis.  2.  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  3.  Complication of arteriovenous vascular device with thrombosis of graft.   PROCEDURE PERFORMED:  Introduction of catheter into the central venous system and imaging of the superior vena cava syndrome from the right.   SURGEON: Katha Cabal, M.D.   SEDATION: Versed 4 mg plus fentanyl 100 mcg administered IV. Continuous ECG and pulse oximetry was performed throughout the entire procedure by the interventional radiology nurse. Total sedation time was approximately 50 minutes.   ACCESS:  A 5-French sheath, right brachial vein.   CONTRAST USED: Isovue 20 mL.   FLUOROSCOPY TIME: 9.6 minutes.   INDICATIONS: [name] is a 59 year old woman with a complicated course and in need of dialysis access. She is undergoing venography to see if a HeRO graft would be acceptable or feasible. Risks and benefits were reviewed. All questions answered. The patient agrees to proceed.   DESCRIPTION OF PROCEDURE: The patient is taken to special procedures and placed in the supine position. After adequate sedation is achieved, she is positioned supine with her right arm extended palm upward. Right arm was prepped and draped in a sterile fashion. Appropriate timeout is called.   Ultrasound is placed in a sterile sleeve. Brachial vein is identified. It is echolucent and compressible indicating patency. Image is recorded for the permanent record. Under  real-time visualization, access is obtained using a micropuncture needle, microwire, followed by microsheath, J-wire and then a 5-French sheath. Using a combination of Glidewires and different catheters, attempts at crossing the innominate occlusion were made, but they were unsuccessful. Attempts at accessing from a jugular approach was also unsuccessful. Multiple images in multiple obliquities were obtained. Subsequently, the catheter was removed. Light pressure was held, and there were no immediate complications.   INTERPRETATION: There is diffuse sclerosis of the central veins with occlusion of the innominate. This is not able to be crossed, and the patient does not have the ability to have a HeRO graft placed.     ____________________________ Katha Cabal, MD ggs:dmm D: 06/24/2013 17:48:16 ET T: 06/24/2013 20:29:54 ET JOB#: [id number]  cc: Katha Cabal, MD, <Dictator> Katha Cabal MD ELECTRONICALLY SIGNED 07/21/2013 19:28

## 2014-11-06 NOTE — Op Note (Signed)
PATIENT NAME:  [name], [name] MR#:  [id number] DATE OF BIRTH:  04-Dec-1955  DATE OF PROCEDURE:  12/24/2013  DATE OF DICTATION: 12/24/2013   PREOPERATIVE DIAGNOSES:  1. End-stage renal disease.  2. Clotted right thigh arteriovenous graft.   POSTOPERATIVE DIAGNOSES:  1. End-stage renal disease.  2. Clotted right thigh arteriovenous graft.   PROCEDURES: 1. Ultrasound guidance for vascular access to graft in both an antegrade and retrograde fashion.  2. Catheter placement to femoral artery and to femoral vein through graft.  3. Right lower extremity shuntogram and central venogram.  4. Catheter-directed thrombolysis with 4 mg of tPA with the AngioJet AVX catheter.  5. Mechanical rheolytic thrombectomy to the graft with the AngioJet AVX catheter.  6. Fogarty embolectomy for arterial plug.  7. Percutaneous transluminal angioplasty of arterial anastomosis with 7 and 8 mm diameter angioplasty balloon for residual stenosis and arterial plug.  8. Percutaneous transluminal angioplasty of distal graft and venous anastomosis with 8 mm diameter angioplasty balloon for thrombus causing greater than 50% stenosis.   SURGEON: Algernon Huxley, MD  ANESTHESIA: Local with moderate conscious sedation.   ESTIMATED BLOOD LOSS: 25 mL.   INDICATION FOR PROCEDURE: This is a 59 year old female with long-standing end-stage renal disease and difficult dialysis access. Her graft is clotted again, and we are attempting to salvage this. She has a catheter on the other side that we will remove if we can get her graft open.   DESCRIPTION OF THE PROCEDURE: The patient is brought to the vascular suite. The right thigh was sterilely prepped and draped, and a sterile surgical field was created. Graft was accessed in both an antegrade and retrograde fashion crossing, and the patient was heparinized. Magic Torque wires were used to cross the arterial anastomosis with a retrograde sheath and the venous anastomosis with the  antegrade sheath without difficulty, and 4 mg of tPA was delivered with the AngioJet AVX catheter and allowed to dwell. A Fogarty embolectomy was performed for an arterial plug on multiple occasions at the arterial anastomosis. After the first pass, there was still residual thrombus and a plug, and I ballooned the arterial anastomosis with a 7 and an 8 mm diameter angioplasty balloon with a suboptimal result. I performed mechanical rheolytic thrombectomy and passed the embolectomy catheter one more time, and this cleared the plug and cleared the proximal portion of the graft. There was thrombus throughout the graft, but the distal portion of the venous anastomosis had residual thrombus which was hanging up and causing flow limitation. Mechanical rheolytic thrombectomy was performed on these areas as well, and these areas were treated with an 8 mm diameter high-pressure angioplasty balloon. Completion angiogram following these interventions showed the graft to now be widely patent. The iliac veins and inferior vena cava were then found to be patent as well. The sheaths were removed, 4-0 Monocryl pursestring sutures placed and pressure was held. Sterile dressing was placed. I then turned my attention to the left femoral PermCath. The area was sterilely prepped and draped and anesthetized with 1% lidocaine. I then pulled with gentle traction on the catheter and used a hemostat to help dissect out the cuff. The catheter was then removed in its entirety without difficulty with gentle traction.     ____________________________ Algernon Huxley, MD jsd:lb D: 12/24/2013 10:36:58 ET T: 12/24/2013 10:56:51 ET JOB#: [id number]  cc: Algernon Huxley, MD, <Dictator> Algernon Huxley MD ELECTRONICALLY SIGNED 12/25/2013 12:34

## 2014-11-06 NOTE — Discharge Summary (Signed)
PATIENT NAME:  [name], BURGIO MR#:  [id number] DATE OF BIRTH:  June 03, 1956  DATE OF ADMISSION:  03/13/2014 DATE OF DISCHARGE:    DISCHARGE DIAGNOSES: 1. Symptomatic hypertension. 2. Near syncope.  3. End-stage renal disease on hemodialysis. 4. Seizure disorder.  5. Chronic hypotension.  6. Chronic systolic heart failure. 7. Anemia of chronic kidney disease.  8. History of asthma.  9. Sick sinus syndrome status post pacemaker.  10. History of multiple clots per dialysis access in the past. She is on Eliquis now.   CONSULTATIONS: Nephrology consult with Dr. Candiss Norse.   HOSPITAL COURSE:  44. [name] patient with history of multiple medical problems of ESRD, on hemodialysis, chronic hypotension, history of seizure disorder, brought in because she almost passed out. The patient felt lightheaded and she fell backwards into the chair at the end of dialysis. The patient's blood pressure was 54/34 and the patient was taken off midodrine 2 months ago. The patient was admitted for near syncope and she was placed in observation. Initially, she was in the critical care unit stepdown. The patient received about 1.5 liters of fluids. The patient was monitored overnight on telemetry for positive arrhythmias or cardiac event. The patient did not have any arrhythmias and troponins were negative. The patient's hypotension thought to be secondary to being off of midodrine. She was seen by nephrology, Dr. Murlean Iba. restarted midodrine,. 2. ESRD, on hemodialysis. Her routine dialysis is tomorrow.  3. Seizure disorder. She is seizure-free, did not have any seizures.  4. Anxiety and depression. She is on Risperdal and also Xanax.  5. The patient's other diagnoses includes dry eyes.  6. History of multiple clots to the dialysis access. She is on Eliquis prophylactically at 2.5 twice daily.   TIME SPENT ON THIS DISCHARGE SUMMARY: More than 30 minutes.    ____________________________ Epifanio Lesches, MD sk:JT D: 03/15/2014 08:29:43 ET T: 03/15/2014 10:55:43 ET JOB#: [id number]  cc: Epifanio Lesches, MD, <Dictator>  Epifanio Lesches MD ELECTRONICALLY SIGNED 03/24/2014 16:51

## 2014-11-06 NOTE — H&P (Signed)
PATIENT NAME:  [name], [name] MR#:  [id number] DATE OF BIRTH:  08/04/1955  DATE OF ADMISSION:  12/18/2013  PRIMARY CARE PHYSICIAN: Dr. Mallie Mussel  REFERRING PHYSICIAN: Dr. Delana Meyer, Vascular Surgery  CHIEF COMPLAINT: High potassium.   HISTORY OF PRESENT ILLNESS: [name] with history of ESRD, CHF, chronic hypotension, comes to the hospital for declotting due to dialysis access clot yesterday. The patient missed dialysis yesterday due to dialysis access clot. Dr. Delana Meyer scheduled her to have a declotting procedure today. However, the patient's potassium is 6.5. Dr. Delana Meyer held the procedure, and put a temporary dialysis catheter on the left femoral. According to [facility], patient needs to be admitted and get emergent dialysis. The patient has no complaints. Denies any cough, sputum, shortness of breath, or hemoptysis. No orthopnea, nocturnal dyspnea, or leg edema.   PAST MEDICAL HISTORY: ESRD, on dialysis; frequent clotting and occlusion of dialysis access, chronic CHF, with ejection fraction 25%; seizure disorder, sick sinus syndrome, status post pacemaker; asthma, anemia of chronic disease, depression, hemorrhoid, chronic hypotension.   PAST SURGICAL HISTORY: Pacemaker placement, AICD, bilateral nephrectomy, tonsillectomy, cholecystectomy, recurrent declotting of dialysis access.   SOCIAL HISTORY: The patient is a resident at [facility]. Wheelchair-bound. No smoking or drinking or illicit drugs.   FAMILY HISTORY: Diabetes and stroke.  ALLERGIES: CONTRAST DYE, IODINE, MEDROL, TRAZODONE.   MEDICATIONS: Please refer to the medication reconciliation list.   REVIEW OF SYSTEMS: CONSTITUTIONAL: The patient denies any fever or chills. No headache or dizziness. No weakness.  EYES: No double vision or blurry vision.  ENT: No postnasal drip, slurred speech, or dysphagia.  CARDIOVASCULAR: No chest pain, palpitations, orthopnea, nocturnal dyspnea. No leg edema.  PULMONARY: No cough,  sputum, shortness of breath, or hemoptysis.  GASTROINTESTINAL: No abdominal pain, nausea, vomiting, diarrhea. No melena or bloody stool.  GENITOURINARY: No dysuria, hematuria, or incontinence.  SKIN: No rash or jaundice.  NEUROLOGIC: No syncope, loss of consciousness, or seizure.  HEMATOLOGY: No easy bruising or bleeding.  ENDOCRINE: No polyuria or polydipsia. No heat or cold intolerance.   VITAL SIGNS: Temperature 97.7, blood pressure 124/63, pulse 60, O2 saturation 94% in room air.   PHYSICAL EXAMINATION:  GENERAL: The patient is alert, awake, oriented, in no acute distress.  HEENT: Pupils round, equal, react to light and accommodation. Moist oral mucosa. Clear oropharynx.  NECK: Supple. No JVD or carotid bruit. No lymphadenopathy. No thyromegaly.  CARDIOVASCULAR: S1, S2. Regular rate and rhythm. No murmur or gallop.  PULMONARY: Bilateral air entry. No wheezing or rales. No use of accessory muscle to breathe.  ABDOMEN: Soft. No distention. No tenderness. No organomegaly. Bowel sounds present.  EXTREMITIES: No edema, clubbing, or cyanosis. No calf tenderness. Bilateral pedal pulses present.  SKIN: No rash or jaundice.  NEUROLOGIC: Alert and oriented x 3. No focal deficit. Power 3/5. Sensation intact.   LABORATORY DATA: INR 1.5, potassium is 6.4.   IMPRESSIONS:  1.  Hyperkalemia, possibly due to missed dialysis.  2.  Dialysis access declot. 3.  End-stage renal disease.  4.  Chronic systolic congestive heart failure, ejection fraction 25%.  5.  Chronic hypotension.  6.  Obesity.  7.  Anemia of chronic disease.  8.  Seizure disorder.   PLAN OF TREATMENT: 1.  The patient will be admitted to the hospital. We will get emergent dialysis, according to Dr. Juleen China. We will follow up with Dr. Delana Meyer for possible vascular surgery.  2.  We will continue patient's home medication, gastrointestinal and deep vein thrombosis prophylaxis.  The patient is on Coumadin. We will continue dose, per  pharmacy.   I discussed patient's condition and plan of treatment with the patient, Dr. Juleen China, and Dr. Delana Meyer.   TIME SPENT: About 58 minutes.    ____________________________ Demetrios Loll, MD qc:mr D: 12/18/2013 19:28:20 ET T: 12/18/2013 20:50:40 ET JOB#: [id number]  cc: Demetrios Loll, MD, <Dictator> Demetrios Loll MD ELECTRONICALLY SIGNED 12/19/2013 16:31

## 2014-11-06 NOTE — Op Note (Signed)
PATIENT NAME:  [name], [name] MR#:  [id number] DATE OF BIRTH:  Sep 23, 1955  DATE OF PROCEDURE:  05/20/2014  PREOPERATIVE DIAGNOSES:  1.  End-stage renal disease.  2.  Clotted right thigh arteriovenous graft.   POSTOPERATIVE DIAGNOSES:  1.  End-stage renal disease.  2.  Clotted right thigh arteriovenous graft.   PROCEDURES:   1. Ultrasound guidance for vascular access both in antegrade and retrograde fashion crossing to right lower extremity arteriovenous graft.  2. Right lower extremity shuntogram and central venogram.  3. Catheter directed thrombolysis with 4 mg of t-PA delivered throughout the graft with the AngioJet AVX catheter.  4. Mechanical rheolytic thrombectomy with the AngioJet AVX catheter.  5. Fogarty embolectomy for residual arterial plug.  6. Percutaneous transluminal angioplasty of arterial anastomosis with 6 mm diameter Lutonix drug-coated angioplasty balloon.  7. Percutaneous transluminal angioplasty of mid and distal graft for residual thrombus and stenosis with an 8 mm diameter angioplasty balloon.  8. Percutaneous transluminal angioplasty of venous anastomosis for residual thrombus and stenosis with 8 mm diameter angioplasty balloon.   SURGEON: Algernon Huxley, MD.   ANESTHESIA: Local with moderate conscious sedation.   ESTIMATED BLOOD LOSS: 25 mL.   INDICATION FOR PROCEDURE: A 59 year old white female well known to [place] for her dialysis access needs. Her graft is clotted and we are attempting to salvage this. Risks and benefits were discussed. Informed consent was obtained.   DESCRIPTION OF PROCEDURE: The patient was brought to the vascular suite. Right lower extremity was sterilely prepped and draped and a sterile surgical field was created. Access was obtained with micropuncture needles under direct ultrasound guidance in both an antegrade and retrograde fashion crossing and permanent image recorded. Micropuncture sheaths and 6 French sheaths were placed and then the  patient was given 4000 units of intravenous heparin. Magic torque wires were used to cross into the femoral artery into the femoral vein and 4 mg of t-PA were delivered with the AngioJet AVX catheter throughout the entirety of the graft and into the femoral vein and femoral artery. This was allowed to dwell. Fogarty embolectomy was performed for a large arterial plug and this resulted in improvement but not resolution of the thrombus at the arterial anastomosis. I then treated this lesion with a 6 mm diameter drug-coated Lutonix angioplasty balloon, inflated it to 12 atmospheres and this resulted in resolution of the thrombus at the arterial anastomosis and the arterial plug. There was however still a large amount of residual thrombus in the mid and distal portions of the graft. Mechanical rheolytic thrombectomy was performed with the AngioJet AVX catheter throughout the graft and into the femoral vein. Following this there was still thrombus and stenosis which was high grade in the midportion of the graft and just beyond this in the mid to distal portion of the graft. The graft then normalized over several centimeters, but there was thrombus and stenosis that was moderate to high-grade at the venous anastomosis. Both of these lesions were treated in a separate and distinct fashion with 8 mm diameter high-pressure angioplasty balloons and further mechanical rheolytic thrombectomy was performed. This resulted in resolution of the thrombus within the graft and brisk flow. We performed the remainder of the outflow through the femoral vein, iliac veins, and inferior vena cava which were all patent. At this point I elected to terminate the procedure. The retrograde sheath had already been removed and then the antegrade sheath was removed, 4-0 Monocryl pursestring suture was placed. Pressure was held. Sterile  dressings were placed. The patient tolerated the procedure well and was taken to the recovery room in stable  condition.    ____________________________ Algernon Huxley, MD jsd:bu D: 05/20/2014 17:10:33 ET T: 05/20/2014 17:30:00 ET JOB#: [id number]  cc: Algernon Huxley, MD, <Dictator> Algernon Huxley MD ELECTRONICALLY SIGNED 05/31/2014 10:11

## 2014-11-06 NOTE — Consult Note (Signed)
CHIEF COMPLAINT and HISTORY:  Subjective/Chief Complaint poorly functioning AVG   History of Present Illness Patient was admitted last night for hypotension, which is a chronic issue for her.  she is lethargic and sleepy this morning, and it is felt due to the fact that she was in ER overnight and did not sleep and her low BP.  Dialysis center called yesterday saying her AVG would not run, and fearing it was clotted.  Her BP currently around 60/30 and the graft does not have a good thrill.  She provides very little history.   PAST MEDICAL/SURGICAL HISTORY:  Past Medical History:   asthma:    epilepsy:    hypertention:    depressive disorder:    hyperkalemia:    hemodyalysis:    Dialysis:    CHF:    COPD:    Anemia:    Renal Failure:    MRSA within past 6 months: Apr 2013   Hernia:    seizures:    pacemaker:   ALLERGIES:  Allergies:  Iodine: Hives  Medrol: Hives  Contrast - Iodinated Radiocontrast Dye: Hives  Trazodone: Unknown  HOME MEDICATIONS:  Home Medications: Medication Instructions Status  fexofenadine 180 mg oral tablet 1 tab(s) orally once a day Active  Topamax 200 mg oral tablet 1 tab(s) orally 2 times a day Active  Tylenol 325 mg oral tablet 2 tab(s) orally every 6 hours, As Needed - for Pain  Active  MiraLax - oral powder for reconstitution 17 gram(s) in 4-8oz of liquid and drink orally 2 times a day Active  Senokot S 50 mg-8.6 mg oral tablet 2 tab(s) orally 2 times a day Active  Symbicort 160 mcg-4.5 mcg/inh inhalation aerosol 2 puff(s) inhaled 2 times a day Active  Keppra 1000 mg oral tablet 1 tab(s) orally once a day Active  RisperDAL 0.5 mg oral tablet 1 tab(s) orally once a day (at bedtime) Active  Xanax 1 mg oral tablet 1 tab(s) orally once a day prior to dialysis on Tuesday, Thursday, and Saturday Active  ethyl chloride topical 100% topical spray Apply topically to right thigh AV graft site for 3 secondsbefore accessing  once prior to  dialysis sessions on Tuesday, Thursday, and Saturday Active  Renvela carbonate 800 mg oral tablet 2 tab(s) orally 3 times a day (with meals) Active  sertraline 100 mg oral tablet 1 tab(s) orally once a day along with sertraline 50 mg Active  sertraline 50 mg oral tablet 1 tab(s) orally once a day along with sertraline 100 mg Active  PhosLo 667 mg oral capsule 3 caps (2,073m) orally 3 times a day Active  doxycycline hyclate 100 mg oral tablet 1 tab(s) orally every other day (after a meal) for rosacea. Active  levETIRAcetam 500 mg oral tablet 1 tab(s) orally once a day along with Keppra 1000 mg equal (15087m total after dialysis on Tuesday, Thursday, and Saturday Active  lamoTRIgine 25 mg oral tablet 2 tabs (5073morally once a day (at bedtime) Active  primidone 250 mg oral tablet 0.5 tab (125m14mrally once a day (at bedtime) Active  Eliquis 2.5 mg oral tablet 1 tab(s) orally 2 times a day Active  Biotene Mouthwash - oral solution 10 milliliter(s) orally 3 times a day Active  Biotene Mouthwash - oral solution 10 milliliter(s) orally prn, As Needed for dry mouth Active  bisacodyl 10 mg rectal suppository 1 suppository(ies) rectal once a day, As Needed - for Constipation Active  Xanax 0.5 mg oral tablet 1 tab(s) orally 2  times a day, As Needed for anxiety/agitation Active  Dulcolax Laxative 5 mg oral delayed release tablet 2 tabs (38m) orally once a day (at bedtime), As Needed - for Constipation Active  Preparation H Cream 0.25%-1% rectal cream 1 application rectal every 6 hours, As Needed for hemorrhoids Active  triamcinolone topical 0.025% topical cream Apply topically to affected area on face once a day, As Needed - for Itching Active  midodrine 10 mg oral tablet 1 tab(s) orally 3 times a day Active  MetroCream 0.75% topical cream Apply topically to face 2 times a day Active  fludrocortisone 0.1 mg oral tablet 4 tabs (0.46m orally once a day Active  RisperDAL Consta 37.5 mg/2 weeks  intramuscular powder for injection, extended release 1 injection (37.5 milligrams) intramuscular every 2 weeks Active  Restasis 0.05% ophthalmic emulsion 1 drop(s) to each eye 2 times a day Active  sodium chloride nasal 0.65% nasal spray 1 spray(s) nasal every 8 hours, As Needed for congestion Active  Systane Balance preserved ophthalmic solution 1 drop(s) to each eye 2 times a day Active  pantoprazole 40 mg oral delayed release tablet 1 tab(s) orally 2 times a day Active  Men-phor 0.5%-0.5% topical lotion Apply topically to arms, legs, trunk/back 3 times a day, As Needed - for Itching Active  traMADol 50 mg oral tablet 1 tab(s) orally every 8 hours, As Needed - for Pain Active  hydrOXYzine hydrochloride 25 mg oral tablet 1 tab(s) orally 2 times a day, As Needed - for Itching Active  guaiFENesin 400 mg oral tablet 2 tab(s) orally 2 times a day Active  Prevident 5000 Sensitive 1.1% topical paste Apply to teeth and brush mucous membrane once a day (before bedtime) Active  Norco 325 mg-5 mg oral tablet 1 tab(s) orally every 6 hours, As Needed - for mild to moderate Pain Active   Family and Social History:  Family History Non-Contributory   Social History negative tobacco, negative ETOH, negative Illicit drugs   Place of Living Nursing Home   Review of Systems:  ROS Pt not able to provide ROS   Medications/Allergies Reviewed Medications/Allergies reviewed   Physical Exam:  GEN well developed, well nourished, obese   HEENT pink conjunctivae, moist oral mucosa   NECK No masses  trachea midline   RESP normal resp effort  no use of accessory muscles   CARD regular rate  LE edema present  no JVD   VASCULAR ACCESS right thigh AVG, no thrill present.   ABD denies tenderness  soft  normal BS   GU no superpubic tenderness   LYMPH negative neck, negative axillae   EXTR negative cyanosis/clubbing, positive edema   SKIN normal to palpation, skin turgor good, neurofibromas all over    NEURO cranial nerves intact, motor/sensory function intact, difficult to assess due to lethargy   PSYCH lethargic   Additional Comments HR 70s, BP around 60/30.  Getting a fluid bolus.  AF   LABS:  Laboratory Results: Routine Chem:    15-Dec-15 1373:42Basic Metabolic Panel (w/Total Calcium)  Glucose, Serum 122  BUN 21  Creatinine (comp) 3.92  Sodium, Serum 138  Potassium, Serum 3.8  Chloride, Serum 96  CO2, Serum 28  Calcium (Total), Serum 9.1  Anion Gap 14  Osmolality (calc) 280  eGFR (African American) 15  eGFR (Non-African American) 13  eGFR values <60100min/1.73 m2 may be an indication of chronic  kidney disease (CKD).  Calculated eGFR, using the MRDR Study equation, is useful in   patients with  stable renal function.  The eGFR calculation will not be reliable in acutely ill patients  when serum creatinine is changing rapidly. It is not useful in  patients on dialysis. The eGFR calculation may not be applicable  to patients at the low and high extremes of body sizes, pregnant  women, and vegetarians.    16-Dec-15 94:85, Basic Metabolic Panel (w/Total Calcium)  Glucose, Serum 98  BUN 31  Creatinine (comp) 4.96  Sodium, Serum 138  Potassium, Serum 4.5  Chloride, Serum 103  CO2, Serum 28  Calcium (Total), Serum 8.8  Anion Gap 7  Osmolality (calc) 282  eGFR (African American) 12  eGFR (Non-African American) 10  eGFR values <60m/min/1.73 m2 may be an indication of chronic  kidney disease (CKD).  Calculated eGFR, using the MRDR Study equation, is useful in   patients with stable renal function.  The eGFR calculation will not be reliable in acutely ill patients  when serum creatinine is changing rapidly. It is not useful in  patients on dialysis. The eGFR calculation may not be applicable  to patients at the low and high extremes of body sizes, pregnant  women, and vegetarians.  Cardiac:    15-Dec-15 13:23, Troponin I  Troponin I < 0.02  0.00-0.05  0.05 ng/mL or  less: NEGATIVE   Repeat testing in 3-6 hrs   if clinically indicated.  >0.05 ng/mL: POTENTIAL   MYOCARDIAL INJURY. Repeat   testing in 3-6 hrs if   clinically indicated.  NOTE: An increase or decrease   of 30% or more on serial   testing suggests a   clinically important change    15-Dec-15 19:45, Troponin I  Troponin I 0.03  0.00-0.05  0.05 ng/mL or less: NEGATIVE   Repeat testing in 3-6 hrs   if clinically indicated.  >0.05 ng/mL: POTENTIAL   MYOCARDIAL INJURY. Repeat   testing in 3-6 hrs if   clinically indicated.  NOTE: An increase or decrease   of 30% or more on serial   testing suggests a   clinically important change  Routine Hem:    15-Dec-15 13:23, CBC Profile  WBC (CBC) 8.8  RBC (CBC) 3.19  Hemoglobin (CBC) 11.0  Hematocrit (CBC) 35.5  Platelet Count (CBC) 177  MCV 112  MCH 34.6  MCHC 31.0  RDW 18.9  Neutrophil % 86.0  Lymphocyte % 9.0  Monocyte % 4.6  Eosinophil % 0.1  Basophil % 0.3  Neutrophil # 7.6  Lymphocyte # 0.8  Monocyte # 0.4  Eosinophil # 0.0  Basophil # 0.0  Result(s) reported on 29 Jun 2014 at 01:35PM.    16-Dec-15 04:07, CBC Profile  WBC (CBC) 6.4  RBC (CBC) 2.68  Hemoglobin (CBC) 9.3  Hematocrit (CBC) 29.8  Platelet Count (CBC) 135  MCV 111  MCH 34.8  MCHC 31.3  RDW 18.6  Neutrophil % 89.8  Lymphocyte % 7.3  Monocyte % 2.5  Eosinophil % 0.1  Basophil % 0.3  Neutrophil # 5.8  Lymphocyte # 0.5  Monocyte # 0.2  Eosinophil # 0.0  Basophil # 0.0  Result(s) reported on 30 Jun 2014 at 04:57AM.   RADIOLOGY:  Radiology Results: XRay:    22-Dec-14 11:45, Chest Portable Single View  Chest Portable Single View  REASON FOR EXAM:    hypotension  COMMENTS:       PROCEDURE: DXR - DXR PORTABLE CHEST SINGLE VIEW  - Jul 06 2013 11:45AM     CLINICAL DATA:  Hypotensive    EXAM:  PORTABLE  CHEST - 1 VIEW    COMPARISON:  02/24/2013    FINDINGS:  The cardiac silhouette is mildly enlarged. There no focal regions of  consolidation  or focal infiltrates. The osseous structures  unremarkable. Epicardial pacing leads are appreciated.     IMPRESSION:  No evidence of acute cardiopulmonary disease.      Electronically Signed    By: Margaree Mackintosh M.D.    On: 07/06/2013 11:55         Verified By: Mikki Santee, M.D., MD    15-Jan-15 15:10, Chest Portable Single View  Chest Portable Single View  REASON FOR EXAM:    near syncope  COMMENTS:       PROCEDURE: DXR - DXR PORTABLE CHEST SINGLE VIEW  - Jul 30 2013  3:10PM     CLINICAL DATA:  Near syncope.  Shortness of breath.  COPD.    EXAM:  PORTABLE CHEST - 1 VIEW    COMPARISON:  07/06/2013    FINDINGS:  Mild cardiomegaly is stable. Epicardial pacemaker leads remain in  stable position. Both lungs are clear. No evidence of pleural  effusion.     IMPRESSION:  Stable cardiomegaly.  No active lung disease.      Electronically Signed    By: Marcello Fennel M.D.    On: 07/30/2013 15:18         Verified By: Marlaine Hind, M.D.,    26-Jan-15 15:13, Chest PA and Lateral  Chest PA and Lateral  REASON FOR EXAM:    HTN  COMMENTS:       PROCEDURE: DXR - DXR CHEST PA (OR AP) AND LATERAL  - Aug 10 2013  3:13PM     CLINICAL DATA:  Shortness of breath, COPD    EXAM:  CHEST  2 VIEW    COMPARISON:  05/20/2014    FINDINGS:  Low lung volumes. The cardiac silhouette is enlarged. Epicardial  pacing leads identified on the left which is stable. There no focal  regions of consolidation or focal infiltrates. There is no evidence  of pneumothorax. The osseous structures unremarkable. There is mild  flattening of the hemidiaphragms.     IMPRESSION:  No evidence of acute cardiopulmonary disease. Findings which may  reflect component of mild COPD.      Electronically Signed    By: Margaree Mackintosh M.D.    On: 08/10/2013 15:41         Verified By: Annie Main. COOPER, M.D., MD    25-Feb-15 17:02, Chest Portable Single View  Chest Portable Single View  REASON FOR  EXAM:    Hypotension, unknown cause  COMMENTS:       PROCEDURE: DXR - DXR PORTABLE CHEST SINGLE VIEW  - Sep 09 2013  5:02PM     CLINICAL DATA:  Low blood pressure, shortness of breath    EXAM:  PORTABLE CHEST - 1 VIEW    COMPARISON:  DG CHEST 2V dated 08/10/2013; DG CHEST 1V PORT dated  07/30/2013; DG CHEST 1V PORT dated 07/06/2013    FINDINGS:  The heart size and mediastinal contours are within normal limits.  Both lungs are clear. The visualized skeletal structures are  unremarkable.     IMPRESSION:  No active disease.      Electronically Signed    By: Skipper Cliche M.D.    On: 09/09/2013 17:01         Verified By: Rachael Fee, M.D.,    27-Feb-15 15:34, Abdomen AP Only  Abdomen AP Only  REASON FOR EXAM:    abdominal pain  COMMENTS:   Bedside (portable):Y    PROCEDURE: DXR - DXR ABDOMEN AP ONLY  - Sep 11 2013  3:34PM     CLINICAL DATA:  Abdominal pain.    EXAM:  ABDOMEN - 1 VIEW    COMPARISON:  CT abdomen and pelvis 09/09/2013    FINDINGS:  Oval gas collection in the midline of the upper abdomen likely  represents stomach within the previously described ventral hernia,  slightly more distended than on prior CT. There is mild gaseous  distension of splenic flexure, similar to prior CT. A small amount  of gas and stool are present the rectum. No dilated small bowel  loops are seen. Surgical clips are seen throughout the abdomen. No  gross intraperitoneal free air is identified. Left femoral approach  venous catheter is present.    IMPRESSION:  Nonspecific bowel gas pattern.      Electronically Signed    By: Logan Bores    On: 09/11/2013 15:40     Verified By: Ferol Luz, M.D.,    02-Apr-15 09:00, Abdomen AP Only  Abdomen AP Only  REASON FOR EXAM:    evaul dialysis catheter  COMMENTS:       PROCEDURE: DXR - DXR ABDOMEN AP ONLY  - Oct 15 2013  9:00AM     CLINICAL DATA:  Possible catheter movement.    EXAM:  ABDOMEN - 1  VIEW    COMPARISON:  CT 09/09/2013    FINDINGS:  Catheter tips are at T12 and L1, unchanged from the CT scout  radiograph. Gas pattern within normal limits.   IMPRESSION:  No evidence of catheter movement.      Electronically Signed    By: Nelson Chimes M.D.    On: 10/15/2013 09:02         Verified By: Jules Schick, M.D.,    02-Apr-15 09:00, Pelvis AP Only  Pelvis AP Only  REASON FOR EXAM:    dialysis catheter loose  COMMENTS:       PROCEDURE: DXR - DXR PELVIS AP ONLY  - Oct 15 2013  9:00AM     CLINICAL DATA:  Dialysis catheter feels loose.    EXAM:  PELVIS - 1-2 VIEW    COMPARISON:  CT 09/09/2013    FINDINGS:  Dual lumen catheter introduced from and left femoral or approach  shows the tips to be present at the T12 and L1 levels. This is  unchanged. No other finding of note.     IMPRESSION:  Catheter appears unchanged with the tips at T12 and L1.      Electronically Signed    By: Nelson Chimes M.D.    On: 10/15/2013 09:01         Verified By: Jules Schick, M.D.,    07-Apr-15 19:08, Chest Portable Single View  Chest Portable Single View  REASON FOR EXAM:    Chest pain  COMMENTS:       PROCEDURE: DXR - DXR PORTABLE CHEST SINGLE VIEW  - Oct 20 2013  7:08PM     CLINICAL DATA:  Chest pain with complaints of pacer spiking.    EXAM:  PORTABLE CHEST - 1 VIEW    COMPARISON:  09/09/2013    FINDINGS:  There are pacer wires over the left chest wall unchanged. Lungs are  adequately inflated as patient is slightly rotated to the right.  There is mild prominence of the perihilar markings  suggesting mild  vascular congestion. There is no focal consolidation or effusion.  There is mild cardiomegaly. Remainder the exam is unchanged.     IMPRESSION:  Suggestion minimal vascular congestion.  Mild stable cardiomegaly.      Electronically Signed    By: Marin Olp M.D.    On: 10/20/2013 19:12         Verified By: Pearletha Alfred, M.D.,    25-Aug-15 10:10,  Chest Portable Single View  Chest Portable Single View  REASON FOR EXAM:    Chest Pain  COMMENTS:       PROCEDURE: DXR - DXR PORTABLE CHEST SINGLE VIEW  - Mar 09 2014 10:10AM     CLINICAL DATA:  Chest pain.    EXAM:  PORTABLE CHEST - 1 VIEW    COMPARISON:  10/20/2013.    FINDINGS:  Mediastinum and hilar structures are normal. Prominent costochondral  calcification. No pleural effusion or pneumothorax. No acute osseous  abnormality.     IMPRESSION:  No acute abnormality .      Electronically Signed    By: Marcello Moores  Register    On: 03/09/2014 10:17         Verified By: Osa Craver, M.D., MD    29-Aug-15 14:43, Lumbar Spine AP and Lateral  Lumbar Spine AP and Lateral  REASON FOR EXAM:    fall with back pain  COMMENTS:       PROCEDURE: DXR - DXR LUMBAR SPINE AP AND LATERAL  - Mar 13 2014  2:43PM     CLINICAL DATA:  Recent traumatic injury with back pain    EXAM:  LUMBAR SPINE - 2-3 VIEW    COMPARISON:  None.    FINDINGS:  Vertebral body height is well maintained. Mild osteophytic changes  are seen. No compression deformities are seen. Postoperative changes  are identified.     IMPRESSION:  Degenerative change without acute abnormality.      Electronically Signed    By: Inez Catalina M.D.    On: 03/13/2014 14:52         Verified By: Everlene Farrier, M.D.,    29-Aug-15 14:43, Sacrum and Coccyx  Sacrum and Coccyx  REASON FOR EXAM:    fall with pain  COMMENTS:       PROCEDURE: DXR - DXR SACRUM AND COCCYX  - Mar 13 2014  2:43PM     CLINICAL DATA:  Fall, low back pain.    EXAM:  SACRUM AND COCCYX - 2+ VIEW    COMPARISON:  None.    FINDINGS:  There is no evidence of fracture or other focal bone lesions.  Diffuse osteopenia. SI joints are symmetric and unremarkable.   IMPRESSION:  No acute bony abnormality.      Electronically Signed    By: Rolm Baptise M.D.    On: 03/13/2014 14:52         Verified By: Raelyn Number, M.D.,    29-Aug-15  17:31, Chest PA and Lateral  Chest PA and Lateral  REASON FOR EXAM:    CHF  COMMENTS:       PROCEDURE: DXR - DXR CHEST PA (OR AP) AND LATERAL  - Mar 13 2014  5:31PM     CLINICAL DATA:  Congestive heart failure.    EXAM:  CHEST  2 VIEW    COMPARISON:  One-view chest 03/09/2014    FINDINGS:  The heart is enlarged. External pacing wires are stable. The lungs  are clear. No focal  effusion are edema is present to suggest  failure. The visualized soft tissues and bony thorax are  unremarkable.     IMPRESSION:  1. Stable cardiomegaly without failure.  2. External pacing wires.      Electronically Signed    By: Lawrence Santiago M.D.    On: 03/13/2014 17:33         Verified By: Resa Miner. MATTERN, M.D.,    15-Sep-15 08:12, Knee Left Complete  Knee Left Complete  REASON FOR EXAM:    fall, ant knee pain  COMMENTS:       PROCEDURE: DXR - DXR KNEE LT COMP WITH OBLIQUES  - Mar 30 2014  8:12AM     CLINICAL DATA:  Left knee pain after fall.    EXAM:  LEFT KNEE - COMPLETE 4+ VIEW    COMPARISON:  None.    FINDINGS:  There is no evidence of fracture, dislocation, or joint effusion.  There is no evidence of arthropathy or other focal bone abnormality.  Soft tissues are unremarkable.     IMPRESSION:  Negative.      Electronically Signed    By: Aletta Edouard M.D.   On: 03/30/2014 08:19         Verified By: Azzie Roup, M.D.,    15-Sep-15 08:12, Knee Right Complete  Knee Right Complete  REASON FOR EXAM:    fall, ant knee pain  COMMENTS:       PROCEDURE: DXR - DXR KNEE RT COMP WITH OBLIQUES  - Mar 30 2014  8:12AM     CLINICAL DATA:  Pain post trauma    EXAM:  RIGHT KNEE - COMPLETE 4+ VIEW    COMPARISON:  None.    FINDINGS:  Frontal, lateral, and bilateral oblique views were obtained. There  is no fracture, dislocation, or effusion. There is mild narrowing of  the patellofemoral joint. No erosive change. There is a  calcification adjacent to the medial  proximal tibial metaphysis  which may have vascular etiology.     IMPRESSION:  Narrowing patellofemoral joint.  No fracture or effusion.      Electronically Signed    By: Lowella Grip M.D.    On: 03/30/2014 08:19         Verified By: Leafy Kindle. WOODRUFF, M.D.,    03-Oct-15 12:36, Chest PA and Lateral  Chest PA and Lateral  REASON FOR EXAM:    cp  COMMENTS:       PROCEDURE: DXR - DXR CHEST PA (OR AP) AND LATERAL  - Apr 17 2014 12:36PM     CLINICAL DATA:  cp, body aches, weakness, on hemodialysis    EXAM:  CHEST - 2 VIEW    COMPARISON:  03/13/2014    FINDINGS:  Stable moderate cardiomegaly. Stable epicardial pacing leads. Mild  interstitial edema or infiltrates slightly increased.  No effusion.  Visualized skeletal structures are unremarkable.     IMPRESSION:  1. Stable cardiomegaly with mild bilateral interstitial  edema/infiltrates.      Electronically Signed    By: Arne Cleveland M.D.    On: 04/17/2014 13:22         Verified By: Kandis Cocking, M.D.,    15-Dec-15 17:01, Knee Left AP and Lateral  Knee Left AP and Lateral  REASON FOR EXAM:    fall  COMMENTS:       PROCEDURE: DXR - DXR KNEE LEFT AP AND LATERAL  - Jun 29 2014  5:01PM  CLINICAL DATA:  Fall today, bilateral knee pain    EXAM:  LEFT KNEE - 1-2 VIEW    COMPARISON:  03/30/2014    FINDINGS:  Two views of the left knee submitted. Diffuse osteopenia is noted.  No acute fracture or subluxation. Mild narrowing of medial joint  compartment. No joint effusion.     IMPRESSION:  No acute fracture or subluxation. Mild narrowing of medial joint  compartment. No joint effusion.      Electronically Signed    By: Lahoma Crocker M.D.    On: 06/29/2014 17:10         Verified By: Ephraim Hamburger, M.D.,    15-Dec-15 17:01, Knee Right AP and Lateral  Knee Right AP and Lateral  REASON FOR EXAM:    fall  COMMENTS:       PROCEDURE: DXR - DXR KNEE RIGHT AP AND LATERAL  - Jun 29 2014  5:01PM      CLINICAL DATA:  Fall today, bilateral knee pain    EXAM:  RIGHT KNEE - 1-2 VIEW    COMPARISON:  03/30/2014    FINDINGS:  Two views of the right knee submitted. No acute fracture or  subluxation. Mild narrowing of medial joint compartment. No joint  effusion. Diffuse osteopenia.     IMPRESSION:  No acute fracture or subluxation. Mild narrowing of medial joint  compartment      Electronically Signed    By: Lahoma Crocker M.D.    On: 06/29/2014 17:11         Verified By: Ephraim Hamburger, M.D.,  [place]:    22-Dec-14 11:45, Chest Portable Single View  PACS Image    15-Jan-15 15:10, Chest Portable Single View  PACS Image    26-Jan-15 15:13, Chest PA and Lateral  PACS Image    25-Feb-15 16:58, CT Abdomen Pelvis WO for [facility]  PACS Image    25-Feb-15 17:02, Chest Portable Single View  PACS Image    27-Feb-15 09:37, CBC Profile  Result Interpretation  Peripheral blood smear reveals prominent intracytoplasmic  bacteria in neutrophils. This was communicated to Dr. Manuella Ghazi   at 12:25 PM. Blood cultures x 2 are  recommended. Mildly macrocytic anemia. T.Rubinas MD.    27-Feb-15 15:34, Abdomen AP Only  PACS Image    02-Apr-15 09:00, Abdomen AP Only  PACS Image    02-Apr-15 09:00, Pelvis AP Only  PACS Image    07-Apr-15 19:08, Chest Portable Single View  PACS Image    25-Aug-15 10:10, Chest Portable Single View  PACS Image    29-Aug-15 14:43, Lumbar Spine AP and Lateral  PACS Image    29-Aug-15 14:43, Sacrum and Coccyx  PACS Image    29-Aug-15 17:31, Chest PA and Lateral  PACS Image    15-Sep-15 08:12, Knee Left Complete  PACS Image    15-Sep-15 08:12, Knee Right Complete  PACS Image    03-Oct-15 12:36, Chest PA and Lateral  PACS Image    03-Oct-15 12:51, CT Head Without Contrast  PACS Image    07-Nov-15 14:20, CT Head Without Contrast  PACS Image    15-Dec-15 17:01, Knee Left AP and Lateral  PACS Image    15-Dec-15 17:01, Knee Right AP and Lateral  PACS Image   CT:    25-Feb-15 16:58, CT Abdomen Pelvis WO for [facility]  CT Abdomen Pelvis WO for [facility]  REASON FOR EXAM:    LLQ pain, IV dye contrast allergy  COMMENTS:       PROCEDURE:  CT  - CT ABDOMEN /PELVIS WO (STONE)  - Sep 09 2013  4:58PM     CLINICAL DATA:  Left lower quadrant abdomen pain.    EXAM:  CT ABDOMEN AND PELVIS WITHOUT CONTRAST    TECHNIQUE:  Multidetector CT imaging of the abdomen and pelvis was performed  following the standard protocol without IV contrast.    COMPARISON:  None.  FINDINGS:  The liver, spleen, pancreas are normal. Patient is status post prior  cholecystectomy. Patient is status post prior left nephrectomy.  There are surgical clips in the right renal fossae with no normal  right kidney identified. There is a 1.5 x 1.5 cm densely calcified  nodule in the right renal fossa question very atrophic right kidney  vs scar from prior right nephrectomy. There is no small bowel  obstruction or diverticulitis. The appendix is not seen but no  inflammation is noted around the cecum. There are ventral hernias of  the upper abdomen and the lower pelvis.    Decompressedbladder limits evaluation. The uterus is atrophic. A  left femoral vein catheter is identified with distal tip in the  inferior vena cava. The visualized lung bases are clear.  Degenerative joint changes of the spine are noted. There is  generalized increased density of the visualized bones.     IMPRESSION:  No acute abnormality identified in the abdomen and pelvis. There is  no evidence of bowel obstruction or diverticulitis.      Electronically Signed    By: Abelardo Diesel M.D.    On: 02/25/201517:11         Verified By: Abelardo Diesel, M.D.,    03-Oct-15 12:51, CT Head Without Contrast  CT Head Without Contrast  REASON FOR EXAM:    ha weakness  COMMENTS:       PROCEDURE: CT  - CT HEAD WITHOUT CONTRAST  - Apr 17 2014 12:51PM     CLINICAL DATA:  ha weakness, pain    EXAM:  CT HEAD WITHOUT  CONTRAST    TECHNIQUE:  Contiguous axial images were obtained from the base of the skull  through the vertex without intravenous contrast.    COMPARISON:  04/21/2013  FINDINGS:  Atherosclerotic and physiologic intracranial calcifications. Coarse  calcifications at within both lateral ventricles as before. There is  no evidence of acute intracranial hemorrhage, brain edema, mass  lesion, acute infarction, mass effect, or midline shift. Acute  infarct may be inapparent on noncontrast CT. No other intra-axial  abnormalities are seen, and the ventricles and sulci are within  normal limits in size and symmetry. No abnormal extra-axial fluid  collections or masses are identified. No significant calvarial  abnormality.     IMPRESSION:  1. Negative for bleed or other acute intracranial process.    Electronically Signed  By: Arne Cleveland M.D.    On: 04/17/2014 13:28         Verified By: Kandis Cocking, M.D.,    07-Nov-15 14:20, CT Head Without Contrast  CT Head Without Contrast  REASON FOR EXAM:    ha  COMMENTS:       PROCEDURE: CT  - CT HEAD WITHOUT CONTRAST  - May 22 2014  2:20PM     CLINICAL DATA:  59 year old female with progressive headache  beginning during dialysis earlier today.    EXAM:  CT HEAD WITHOUT CONTRAST    TECHNIQUE:  Contiguous axial images were obtained from the base of the skull  through the  vertex without intravenous contrast.  COMPARISON:  Most recent prior CT scan of the head 04/17/2014    FINDINGS:  Negative for acute intracranial hemorrhage, acute infarction, mass,  mass effect, hydrocephalus or midline shift. Gray-white  differentiation is preserved throughout. Stable  subependymal/periventricular calcifications lining the bilateral  lateral ventricles over multiple prior studies dating back to 2013.  Hyperostosis frontalis interna. Normal aeration of the mastoid air  cells and paranasal sinuses. 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     IMPRESSION:  1. No acute intracranial abnormality.  2. No significant interval change in the appearance of the brain  compared including periventricular subependymal calcifications  dating back to 2013.      Electronically Signed    By: Jacqulynn Cadet M.D.    On: 05/22/2014 14:46         Verified By: Criselda Peaches, M.D.,   ASSESSMENT AND PLAN:  Assessment/Admission Diagnosis ESRD with possible thrombosis of AVG symptomatic hypotension multiple other medical issues as above, being managed by primary service   Plan Was scheduled for shuntogram/declot after request from dialysis center Discussed with Dr. Posey Pronto who feels with a fluid bolus this would be safe today. Will proceed with shuntogram     level 4 consult   Electronic Signatures: Algernon Huxley (MD)  (Signed 16-Dec-15 11:01)  Authored: Chief Complaint and History, PAST MEDICAL/SURGICAL HISTORY, ALLERGIES, HOME MEDICATIONS, Family and Social History, Review of Systems, Physical Exam, LABS, RADIOLOGY, Assessment and Plan   Last Updated: 16-Dec-15 11:01 by Algernon Huxley (MD)

## 2014-11-06 NOTE — Discharge Summary (Signed)
PATIENT NAME:  [name], [name] MR#:  [id number] DATE OF BIRTH:  Aug 26, 1955  DATE OF ADMISSION:  12/18/2013 DATE OF DISCHARGE:  12/19/2013  ADMITTING PHYSICIAN: [name] Loll, MD  DISCHARGING PHYSICIAN: [name] Lighter, MD  PRIMARY CARE PHYSICIAN: [name] Schick, MD   [place]:  1.  Vascular surgery with [name].  2.  Nephrology with [name].   DISCHARGE DIAGNOSES: 1.  Hyperkalemia.  2.  End-stage renal disease on Tuesday, Thursday, Saturday hemodialysis.  3.  Systolic congestive heart failure, ejection fraction of 25%.  4.  Chronic hypotension.  5.  Seizure disorder.  6.  Sick sinus syndrome, status post pacemaker.  7.  Asthma.  8.  Anemia of chronic disease.  9.  Depression. 10.  Frequent clotting and occlusion of dialysis access, currently on Coumadin.   DISCHARGE HOME MEDICATIONS:  1.  Fexofenadine 180 mg p.o. daily.  2.  Topamax 200 mg p.o. b.i.d.  3.  Tylenol 650 mg q.6 hours p.r.n. for pain.  4.  Dulcolax 10 mg daily at bedtime as needed for constipation.  5.  Midodrine 10 mg p.o. t.i.d.  6.  Bactroban 2% ointment nasally twice a day.  7.  Lac-Hydrin 12% topical lotion to extremities twice a day.  8.  MetroCream 0.75% topical cream topically to face twice a day.  9.  Hydrocortisone 0.1 mg tablet 4 tablets daily.  10.  Risperdal Consta 37.5 mg per 2 weeks intramuscular injection every 2 weeks.  11.  Biotin mouthwash 10 mL orally p.r.n.  12.  Ferrous sulfate 325 mg p.o. b.i.d.  13.  Keppra 1000 mg on nondialysis days: Monday, Wednesday, Friday, and Sunday.  14.  Keppra 1500 mg on dialysis days after dialysis on Tuesday, Thursday, and Saturday.  15.  Restasis 0.05% ophthalmic emulsion to both eyes twice a day for dry eyes.  16.  PhosLo 667 mg capsule 3 capsules once a day at 2:00 p.m. on Tuesday, Thursday, and Saturday. 17.  PhosLo 667 mg 3 capsules orally twice a day at 6:00 a.m. and 6:00 p.m.  18.  PhosLo 3 capsules orally once a day at 11:30  a.m. on Monday, Wednesday, Friday, and Sunday.  19.  Renvela 800 mg 2 tablets once a day at 11:30 a.m. on Monday, Wednesday, Friday, and Sunday.  20.  Renvela 800 mg 2 tablets once a day at 2:00 p.m. on Tuesday, Thursday, Saturday.  21.  Renvela 800 mg 2 tablets 3 times a day with food.  22.  Xanax 0.25 mg p.o. b.i.d. p.r.n. for anxiety.  23.  Biotin mouthwash 3 times a day (10 mL orally).  24.  Norco 5/325 mg 1 tablet q.6 hours p.r.n. for pain.  25.  Triamcinolone 0.025% topical -apply on face as needed.  26.  Men-Phor 0.5% topical lotion on face twice a day.  27.  Doxycycline 100 mg orally every other day.  28.  Bactroban 2% to upper abdomen once a day.  29.  Jantoven 7.5 mg p.o. daily.  30.  Primidone 125 mg p.o. at bedtime.  31.  Lamictal 25 mg at bedtime.  32.  Zoloft 100 mg once a day.  33.  Symbicort 160/4.5, 2 puffs twice a day.  34.  Senokot 2 tablets b.i.d.  35.  MiraLax powder p.r.n. for constipation.  36.  Renvela 800 mg 1 tablet at bedtime.  37.  Protonix 40 mg p.o. b.i.d.  38.  Systane Balance ophthalmic solution one drop each eye twice a day.  39.  Restasis 0.05% ophthalmic emulsion one drop each eye twice a day for dry eyes.  40.  Sodium chloride 0.65% nasal spray, one spray every 8 hours as needed for dry nose and congestion.   DISCHARGE DIET: Renal diet.   DISCHARGE ACTIVITY: As tolerated.    FOLLOWUP INSTRUCTIONS: 1.  Follow up for dialysis on Tuesday 12/22/2013.  2.  Follow up with [name] for declotting dialysis access on 12/23/2013.  3.  PCP followup in one week.   LABORATORY AND IMAGING STUDIES PRIOR TO DISCHARGE: WBC 3.0, hemoglobin 11.2, hematocrit 35.1, platelet count 115.   Sodium 133, potassium 4.9, chloride 98, bicarbonate 27, BUN 64, creatinine 7.49, glucose 82, and calcium of 8.5. INR 1.5.   BRIEF HOSPITAL COURSE: [name] is a 59 year old female with end-stage renal disease on hemodialysis, systolic CHF, chronic hypotension, and chronic  issues with clotting of dialysis access. Presented to the hospital yesterday for dialysis access declotting, and [name] had to put a temporary tunneled dialysis catheter. The patient's potassium was noted to be 6.5, so she was admitted for treatment of her potassium. 1.  Hyperkalemia: The patient got dialysis yesterday and per her regular schedule today. Potassium is back to baseline. Continue for dialysis as scheduled on next Tuesday.  2.  End-stage renal disease on hemodialysis Tuesday, Thursday, Saturday schedule: Multiple access clotting off issues. Has a temporary dialysis catheter placed by [name] yesterday for now, which will be used for dialysis on Tuesday as well. The patient follows with [facility] for dialysis. On Wednesday, she is already scheduled for her procedure for declotting her dialysis graft. She is on Coumadin for the same to prevent clotting of her dialysis access frequently.  3.  Seizure disorder: Continue home medications.  4.  All her other medications are being continued without any changes. Her course has been otherwise uneventful in the hospital.   DISCHARGE CONDITION: Stable.   DISCHARGE DISPOSITION: To [facility] skilled nursing facility.   TIME SPENT ON DISCHARGE: 45 minutes.    ____________________________ [name] Lighter, MD rk:jcm D: 12/19/2013 14:06:04 ET T: 12/19/2013 15:11:02 ET JOB#: [id number]  cc: [name] Lighter, MD, <Dictator> [name] Lighter MD ELECTRONICALLY SIGNED 12/26/2013 16:01

## 2014-11-06 NOTE — Discharge Summary (Signed)
PATIENT NAME:  [name], [name] MR#:  [id number] DATE OF BIRTH:  09-27-1955  DATE OF ADMISSION:  09/09/2013 DATE OF DISCHARGE:  09/16/2013   PRIMARY CARE PHYSICIAN: Dr. Daneen Schick  CONSULTING PHYSICIAN: Dr. Holley Raring,  Dr. Lucky Cowboy and Dr. Bary Leriche, Dr. Ola Spurr,  Dr. Tiffany Kocher.    DISCHARGE DIAGNOSES:  1. Acute on chronic hypotension.  2. Acute blood loss on chronic anemia.  3. End stage renal disease.. 4. Acute on chronic diarrhea.  5. Chronic systolic congestive heart failure, ejection fraction 25%.  6. Seizure disorder.  7. Anxiety, depression.  8. Thrombocytopenia.   CONDITION: Stable.   CODE STATUS: FULL CODE.   HOME MEDICATIONS: Please refer to the [facility] physician discharge instruction medication reconciliation list. The patient's MiraLAX and Senokot  will be discontinued due to diarrhea. Norco will be discontinued due to hypotension.  Doxycycline will be discontinued. We will give Cipro 500 mg every 12 hours for 10 days   DIET: Renal diet.   ACTIVITY: As tolerated.   FOLLOW-UP CARE: Follow with PCP within 1 to 2 weeks. Follow up with Dr. Holley Raring within 1 to 2 weeks. Follow up with Dr. Tiffany Kocher within 1 to 2 weeks.   REASON FOR ADMISSION:  Hypotension.   HOSPITAL COURSE: The patient is a 59 year old Caucasian female with a history of ESRD., chronic CHF, anemia, was sent from nursing home to ED due to hypotension with a systolic blood pressure in the 70s. The patient also complains of abdominal pain and diarrhea for the past two weeks prior to this admission. The patient also reported bright red blood per rectum. The patient was scheduled to get vascular declotting, but it was cancelled due to hypotension. For detailed history and physical examination, please refer to the admission note dictated by Dr. Manuella Ghazi.   LABORATORY DATA ON ADMISSION DATE: Showed sodium 132, potassium 4.3, chloride 97, bicarbonate 31, BUN 30, creatinine 0.31, hemoglobin 6.5. Chest x-ray did not show any acute  cardiopulmonary disease. CAT scan of the abdomen and pelvis did not show any abnormality.   1. Acute blood loss anemia, likely due to GI bleeding. At admission the patient was treated with Protonix drip with PRBC 1 unit transfusion, hemoglobin increased from 6.5 to 7.5; however, hemoglobin decreased to 6.5 again, so the patient got another unit of PRBC transfusion. The last hemoglobin yesterday was 7.9. The patient has no more active bleeding and Dr. Tiffany Kocher signed off.  2. The patient also has ESRD and has been treated with hemodialysis.   3. Septicemia. The patient was found to have bacteria in neutrophils.  The patient got additional 2 sites of blood culture which showed Klebsiella pneumonia.  Dr. Ola Spurr suggested vancomycin and Zosyn treatment. Since blood culture sensitivity showed sensitive to Rocephin so vancomycin and Zosyn was discontinued and Rocephin was started. According to Dr. Ola Spurr the patient may be discharged with Cipro for 10 days, according to sensitivities.   4. AV graft clotting. The patient was status post thrombectomy yesterday.   5. Acute on chronic hypotension. The patient has been treated with Florinef, midodrine and pseudoephedrine. Her baseline is about 90s and the last blood pressure is 96/56 today.   6. Anxiety and depression. The patient feels depressed, agitated and frustrated. We requested behavior medicine physician consult, Dr. Bary Leriche evaluated the patient and suggest anxiety and depression medications.  7. CHF, chronic systolic, ejection fraction 25%, has been stable. The patient cannot be treated with beta blocker or ACE inhibitor due to hypotension.   The patient is  clinically stable. Vital signs are stable, will be discharged to nursing home today. I discussed the patient's discharge plan with the patient, nurse and case manager. Also discussed with Dr. Ola Spurr and  Dr. Holley Raring.   TIME SPENT: About 50 minutes.    ____________________________ Demetrios Loll, MD qc:sg D: 09/16/2013 13:14:00 ET T: 09/16/2013 13:31:44 ET JOB#: [id number]  cc: Demetrios Loll, MD, <Dictator> Demetrios Loll MD ELECTRONICALLY SIGNED 09/16/2013 17:24

## 2014-11-06 NOTE — H&P (Signed)
PATIENT NAME:  [name], [name] MR#:  [id number] DATE OF BIRTH:  [birth year]  DATE OF ADMISSION:  03/13/2014  REFERRING PHYSICIAN: Corky Sing, PA-C  PRIMARY CARE PHYSICIAN: Melvyn Novas. Henry-Smith, MD  CARDIOLOGIST: Loran Senters. Callwood, MD   CHIEF COMPLAINT: Almost passed out.   HISTORY OF PRESENT ILLNESS: A 59 year old Caucasian female with a history of end-stage renal disease on hemodialysis Tuesdays, Thursdays, Saturdays; congestive heart failure, ejection fraction of 25%; as well as seizure disorder; presenting after hemodialysis. Had a near syncopal episode. She completed dialysis as usual, 4 hours and 15 minutes total end time, was ambulating from dialysis here to get weighed, felt lightheaded, and states that she fell backwards into a chair. No head trauma or loss of consciousness. Denies any chest pain, shortness of breath, palpitations, or further symptomatology. Denies any preceding symptoms or illnesses. She is brought to the Emergency Department for the above. Noted to be hypotensive, with blood pressure the lowest 54/34, currently 79/55. According to her, she was taken off of midodrine about 1 to 2 months ago and was somewhat unsure of this fact.  REVIEW OF SYSTEMS:  CONSTITUTIONAL: Denies fever, fatigue, weakness.  EYES: Denies blurred vision, double vision, or eye pain.  EARS, NOSE, THROAT: Denies tinnitus, ear pain, hearing loss.  RESPIRATORY: Denies cough, wheeze, shortness of breath.  CARDIOVASCULAR: Denies chest pain, palpitations, edema.  GASTROENTEROLOGY: Denies nausea, vomiting, diarrhea, abdominal pain.  GENITOURINARY: Denies dysuria or hematuria. She apparently does make urine on occasion, though it is rarely.  ENDOCRINE: Denies nocturia or thyroid problems. HEMATOLOGIC AND LYMPHATIC: Denies easy bruising, bleeding.  SKIN: Denies rashes or lesions.  MUSCULOSKELETAL: Denies pain in neck, back, shoulder, knees, hips, or arthritic symptoms.  NEUROLOGIC: Denies  paralysis, paresthesias.  PSYCHIATRIC: Denies anxiety or depressive symptoms. Otherwise, full review of systems performed by me is negative.   PAST MEDICAL HISTORY: End-stage renal disease on hemodialysis Tuesday, Thursday, Saturday; congestive heart failure, ejection fraction of 25%; history of COPD as well as seizure disorder; sick sinus syndrome status post permanent pacemaker insertion as well as AICD insertion.   SOCIAL HISTORY: Denies alcohol, tobacco, or drug usage.   FAMILY HISTORY: Positive for diabetes as well as stroke.   ALLERGIES: CONTRAST DYE, IODINE, MEDROL, AND TRAZODONE.   HOME MEDICATIONS: Include fludrocortisone 0.4 mg p.o. q. daily; Norco 325/5 mg p.o. q. 6 hours as needed for pain; Tylenol 325 mg 2 tabs q. 6 hours as needed for pain; Preparation H 0.25%-1% rectal cream q. 6 hours as needed for hemorrhoids; Eliquis 2.5 mg p.o. b.i.d.; Keppra 1000 mg p.o. q. daily; lamotrigine 25 mg 2 tabs p.o. at bedtime; levetiracetam 500 mg p.o. q. daily; primidone 250 mg 1/2 tablet p.o. at bedtime; Topamax 200 mg p.o. b.i.d.; sertraline 150 mg p.o. q. daily; fexofenadine 180 mg p.o. q. daily; Risperdal 0.5 mg p.o. at bedtime; Xanax 0.5 mg p.o. b.i.d. as needed for anxiety or agitation; Symbicort 160 mcg/4.5 mcg inhalation 2 puffs b.i.d.; Dulcolax suppository 10 mg as needed for constipation daily; Dulcolax 5 mg 2 tabs p.o. at bedtime as needed for constipation;  MiraLax 17 g p.o. b.i.d.; Senokot S 50/8.6, 2 tabs p.o. b.i.d.; PreviDent 5000 Sensitive 1.1% topical paste to affected area at bedtime; midodrine 10 mg p.o. 3 times daily; Restasis 0.05% ophthalmic emulsion 1 drop b.i.d.; PhosLo 667 mg 3 capsules 3 times daily; Renvela 800 mg 2 capsules p.o. 3 times daily; pantoprazole 40 mg p.o. b.i.d.   PHYSICAL EXAMINATION:  VITAL SIGNS: Temperature 97.6, heart rate  64, respirations 20, blood pressure 79/55, saturating 99% on room air. Weight 130.5 kg, BMI of 45.1.  GENERAL: Obese, Caucasian female,  currently in no acute distress.  HEAD: Normocephalic, atraumatic.  EYES: Pupils equal, round, reactive to light. Extraocular muscles intact. No scleral icterus.  MOUTH: Moist mucosal membrane. Dentition intact. No abscess noted. EAR, NOSE, THROAT: Clear without exudates. No external lesions.  NECK: Supple. No thyromegaly. No nodules. No JVD.  PULMONARY: Clear to auscultation bilaterally without wheezes, rales, or rhonchi. No use of accessory muscles. Good respiratory effort. CHEST: Nontender to palpation.  CARDIOVASCULAR: S1, S2, regular rate and rhythm. No murmurs, rubs, or gallops. No edema. Pedal pulses 2+ bilaterally. GASTROINTESTINAL: Soft, nontender, nondistended. No masses. Positive bowel sounds. Obese.  MUSCULOSKELETAL: No swelling, clubbing, or edema. Range of motion full in all extremities.  NEUROLOGIC: Cranial nerves II through XII intact with no gross focal deficits. Sensation intact. Reflexes intact.  SKIN: No ulceration, lesions, rash, cyanosis. Skin warm, dry. Turgor intact.  PSYCHIATRIC: Mood and affect within normal limits. Alert and oriented x 3. Insight and judgment intact.   LABORATORY DATA: Chest x-ray performed, reveals stable cardiomegaly without evidence of failure. Remainder of laboratory data: Sodium 141, potassium 3.5, chloride 99, bicarbonate 32, BUN 9, creatinine 2.07, glucose 100. Troponin less than 0.02. WBC 5.5, hemoglobin 9.9, platelets of 177,000.   ASSESSMENT AND PLAN: A 59 year old Caucasian female with a history of end-stage renal disease on hemodialysis Tuesday, Thursday, Saturday; chronic hypotension previously on midodrine and Florinef; congestive heart failure, ejection fraction of 25%; presenting after a near-syncopal episode after hemodialysis. Completed dialysis as usual, 4 hours and 15 minutes; was ambulating to get weighed; felt lightheaded; states she fell backwards into a chair. No head trauma or loss of consciousness. She was noted to be hypotensive  on arrival.  1.  Near syncope, sounds orthostatic in nature. However, orthostatic vital signs, according to the Emergency Department, are negative. Provide gentle intravenous fluid hydration, stopping after 1 L total, given congestive heart failure and end-stage renal disease. Place on telemetry. Trend cardiac enzymes x 3. If she has any telemetry events, will need to interrogate her automatic implantable cardioverter-defibrillator and permanent pacemaker.  2.  Hypotension. She does have a history of chronic hypotension, currently off midodrine. This is post hemodialysis. Her blood pressure has improved. This is most likely related to hemodialysis. Provide gentle intravenous fluid hydration as stated above. Restart her midodrine. Follow blood pressure. She may need an increase of her Florinef dosing as well.  3.  End-stage renal disease on dialysis. Just completed dialysis today. If blood pressure does not improve, we will consult nephrology, as her stay might be longer.  4.  Seizure disorder. Continue her home medications.  5.  Venous thromboembolism prophylaxis. Eliquis.   CODE STATUS: Patient is full code.   TIME SPENT: 45 minutes.    ____________________________ Aaron Mose. Hower, MD dkh:ST D: 03/13/2014 20:59:55 ET T: 03/13/2014 21:37:45 ET JOB#: [id number]  cc: Aaron Mose. Hower, MD, <Dictator> DAVID Woodfin Ganja MD ELECTRONICALLY SIGNED 03/16/2014 1:41

## 2014-11-06 NOTE — Consult Note (Signed)
Consult done.  No evidence of GI bleeding at this time.  Stool was heme neg.  Dark stools at time but on iron.  When she sees blood she wipes after a bowel movement and sees BRB on paper.  Hx of ulcers on EGD in 1992.  Pt on protonix drip at this time.  Would continue PPI and can switch to oral once or twice a day.  No need for EGD or  colonoscopy at this time.  Would give empiric PPI after discharge from hospital as erosive gastritis and ulcers are increased in patients with CRF and on dialysis. Would continue one a day indefinitely.  Electronic Signatures: Manya Silvas (MD)  (Signed on 26-Feb-15 12:49)  Authored  Last Updated: 26-Feb-15 12:49 by Manya Silvas (MD)

## 2014-11-06 NOTE — Consult Note (Signed)
Brief Consult Note: Diagnosis: Mood disorder NOS, personality disorder NOS.   Patient was seen by consultant.   Consult note dictated.   Recommend further assessment or treatment.   Orders entered.   Discussed with Attending MD.   Comments: Ms. Monda has  h/o depression and mood instability. She used to see a psychiatrist at [place] but was discharged from their practice. She is on the Zoloft and multiple seizure medications that should serve as mood stabilizers. She is super irritated today as she feels "disrespected" by hospital staff. The only example she was able to give me was fluid restriction. She considers it disrespectfulas if we did not trust her judgment.  PLAN: 1. We will increase Zoloft to 100 mg and Risperdal to 2 mg for depression.   2. She is on plenty mood stabilizers. We could increase Lamictal gradually to 200 mg. IO do not know when it was started.   3. I will follow up.  Electronic Signatures: Orson Slick (MD)  (Signed 03-Mar-15 17:12)  Authored: Brief Consult Note   Last Updated: 03-Mar-15 17:12 by Orson Slick (MD)

## 2014-11-06 NOTE — Op Note (Signed)
PATIENT NAME:  [name], [name] MR#:  [id number] DATE OF BIRTH:  1955/11/26  DATE OF PROCEDURE:  12/18/2013  PREOPERATIVE DIAGNOSES: 1.  Thrombosis right thigh loop graft.  2.  Superior vena cava syndrome.  3.  End-stage renal disease requiring hemodialysis.  4.  Hyperkalemia.   POSTOPERATIVE DIAGNOSES: 1.  Thrombosis right thigh loop graft.  2.  Superior vena cava syndrome.  3.  End-stage renal disease requiring hemodialysis.  4.  Hyperkalemia.  PROCEDURE:  Placement of left femoral cuffed tunneled dialysis catheter.   SURGEON:  Katha Cabal, M.D.   DESCRIPTION OF PROCEDURE:  The patient is taken to special procedures and placed in the supine position.  After adequate sedation is achieved, the left thigh is prepped and draped in a sterile fashion.  1% lidocaine is infiltrated in the soft tissues and ultrasound is placed in a sterile sleeve.  Common femoral vein is identified.  It is echolucent and compressible indicating patency.  Image is recorded for the permanent record and under direct visualization, Seldinger needle is inserted.  J-wire is advanced under fluoroscopy into the right atrium.  Counterincision is created and a 44 cm tip to cuff catheter is selected.  It is a Unibody model.  It is approximated to the site and then a small counterincision is made more distally and laterally on the thigh.  The catheter is then pulled subcutaneously and advanced over the wire through the peel-away sheath.  Initially, this does not allow for smooth transition.  A second wire is advanced and is ultimately a second peel-away sheath is used which allows for smooth passage of the catheter so that the tips are positioned at the level of the hepatic inferior vena cava.  Both lumens aspirate and flush easily.  The catheter has a smooth contour and they are packed with 5000 units of heparin per lumen.  The catheter is then secured to the skin of the thigh with 0 silk.  The counterincision is closed  with 4-0 Monocryl subcuticular and Dermabond.  The patient tolerated the procedure well and there were no immediate complications.  She is being admitted to the medical service given her hyperkalemia and will undergo dialysis immediately and thrombectomy will be performed over thigh graft at a later date.     ____________________________ Katha Cabal, MD ggs:ea D: 12/18/2013 19:17:58 ET T: 12/19/2013 04:22:39 ET JOB#: [id number]  cc: Katha Cabal, MD, <Dictator> Katha Cabal, MD Katha Cabal MD ELECTRONICALLY SIGNED 12/29/2013 9:19

## 2014-11-06 NOTE — Op Note (Signed)
PATIENT NAME:  [name], [name] MR#:  [id number] DATE OF BIRTH:  05-20-1956  DATE OF PROCEDURE:  08/14/2013  PREOPERATIVE DIAGNOSES: 1.  End-stage renal disease requiring hemodialysis.  2.  Superior vena cava syndrome.  3.  Multiple complications of vascular devices.  POSTOPERATIVE DIAGNOSES:  1.  End-stage renal disease requiring hemodialysis.  2.  Superior vena cava syndrome.  3.  Multiple complications of vascular devices.  PROCEDURE PERFORMED: Creation of a right thigh loop graft using 7.5 mm Artegraft.   SURGEON: Hortencia Pilar, M.D.   ANESTHESIA: General by endotracheal intubation.   FLUIDS: Per anesthesia record.   ESTIMATED BLOOD LOSS: 50 mL.   SPECIMEN: None.   INDICATIONS: [name] is a 59 year old woman who presents for creation of right thigh access. Currently she is maintained on a left femoral tunneled catheter. The risks and benefits for creation of access were reviewed. Indications for performing a thigh graft were also reviewed. All questions have been answered. The patient has agreed to proceed.   DESCRIPTION OF PROCEDURE: The patient is taken to the operating room and placed in the supine position. After adequate general anesthesia is induced and appropriate invasive monitors are placed, she is positioned supine and her right thigh is prepped and draped in a sterile fashion. Appropriate timeout is called.   A previous vertical incisional scar is re-incised from its distal two-thirds extending it slightly beyond its lower limit and the dissection is carried down to expose the femoral sheath. The femoral sheath is opened and the superficial femoral artery and vein is noted. Dissection is then carried more proximally to the point where the anastomosis of the Gore-Tex bypass is encountered. Given her history of bypass rather than anastomose the graft to the Gore-Tex, which exists already, I elected to just dissect the SFA slightly below the level of the anastomosis.  The vein is then identified just medially and dissected as well.   A counter incision is made after the Artegraft is approximated to the thigh and the dissection is carried down and a small pocket is created at the apex to allow for a smooth contour. The Artegraft is then straightened onto the field, marked with a surgical marker, and a 5 mm straight Leander Rams sound is inserted into one end, incision is made longitudinally, and then re-sewn narrowing approximately 3 cm of the graft down to a 5 mm diameter. This is done in an attempt  to prevent steal. The Leander Rams sound is removed. The tunneling device is then used to create a subcutaneous path from the artery to the apex, and the Artegraft is pulled through. It is then reversed and a path from the venous site to the apex is configured and the graft is pulled completely through in the usual horseshoe shape.  Control is obtained to the artery proximally and distally with Silastic vessel loops. Arteriotomy is made, 6-0 Prolene stay sutures are placed, and an end Artegraft to side artery anastomosis is fashioned with running 6-0 Prolene, flushing maneuvers are performed, and the graft was pressurized. Flow is re-established distally. The graft is easy to feel, strongly pulsatile throughout its course and has a smooth contour, including at the apex. It is then adjusted for length at the arterial portion and approximated to the vein which is in its native bed.   Satinsky clamp is then used to control the venous portion and a venotomy is made and 6-0 Prolene stay sutures are placed. The graft is then beveled to match the  venotomy and an end to graft to side vein anastomosis is fashioned with running 6-0 Prolene. Flushing maneuvers are performed and flow is established through the graft. An excellent thrill is noted throughout the graft. Distal pulse within the incision is maintained.   The wounds are then irrigated with sterile saline and closed in layers using  multiple layers of 3-0 Vicryl followed by surgical staples. A Prevena VAC dressing is applied. The patient tolerated the procedure well. There were no immediate complications. Sponge and needle counts were correct. She is taken to the recovery area in stable condition.   ____________________________ Katha Cabal, MD ggs:sb D: 08/14/2013 14:13:53 ET T: 08/14/2013 16:12:30 ET JOB#: [id number]  cc: Katha Cabal, MD, <Dictator> Katha Cabal MD ELECTRONICALLY SIGNED 08/27/2013 17:16

## 2014-11-06 NOTE — Op Note (Signed)
PATIENT NAME:  [name], [name] MR#:  [id number] DATE OF BIRTH:  September 25, 1955  DATE OF PROCEDURE:  11/02/2013  PREOPERATIVE DIAGNOSES:   1.  End-stage renal disease.  2.  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  3.  Morbid obesity.   POSTOPERATIVE DIAGNOSES: 1.  End-stage renal disease.  2.  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  3.  Morbid obesity.   PROCEDURES:  1.  Ultrasound guidance for vascular access in both an antegrade and retrograde fashion in the right thigh AV graft.  2.  Right lower extremity shuntogram and central venogram.  3.  Catheter-directed thrombolysis performed with 4 mg of TPA with the AngioJet AVX catheter.  4.  Mechanical rheolytic thrombectomy with the AngioJet AVX catheter.  5.  Fogarty embolectomy for arterial plug.  6.  Percutaneous transluminal angioplasty with 7 and 8 mm diameter angioplasty balloon to the arterial anastomosis for arterial plug.  7.  Percutaneous transluminal angioplasty of venous anastomosis for residual thrombus causing flow limitation.   SURGEON: Algernon Huxley, M.D.   ANESTHESIA: Local with moderate conscious sedation.   ESTIMATED BLOOD LOSS: 25 mL.   INDICATION FOR PROCEDURE: A 59 year old white female with very limited dialysis access options.  She has had a multiply recurrent clotted right thigh AV graft.  We are attempting to salvage this.    DESCRIPTION OF PROCEDURE;  The patient is brought to the vascular suite. The right thigh was sterilely prepped and draped and a sterile surgical field was created. I accessed the graft both in antegrade and retrograde fashion crossing and placed 6-French sheaths. The patient was given 3000 units of intravenous heparin. Imaging showed a clotted graft. Magic torque wire was placed into the femoral artery, into the femoral vein and at [place] catheter was used to instill 4 mg of TPA to the thrombosed  graft. Following this, it was allowed to dwell for approximately 10 to 15 minutes. I performed Fogarty embolectomy for residual arterial plug, however, this remained persistent. Mechanical rheolytic thrombectomy was performed with the AngioJet AVX catheter.  I then pulled a Fogarty embolectomy catheter, again clearing the arterial limb of the graft. The retrograde sheath could then be removed. There was still residual thrombus at the venous access site and the venous anastomosis. Mechanical rheolytic thrombectomy was then performed on the venous side with clearance of the graft with the exception of flow-limiting residual thrombus at the venous anastomosis. The AngioJet AVX catheter was run some more and 8 mm diameter angioplasty balloon was inflated at the venous anastomosis. Following this, there was excellent flow through the graft with no significant residual stenosis. The remainder of the central venous flow in the IVC was also seen to be patent. At this point, I elected to terminate the procedure. The sheath was removed, 4-0 Monocryl pursestring suture was placed and pressure was held. Sterile dressing was placed. The patient tolerated the procedure well and was taken to the recovery room in stable condition.     ____________________________ Algernon Huxley, MD jsd:cs D: 11/02/2013 16:34:25 ET T: 11/02/2013 19:49:20 ET JOB#: [id number]  cc: Algernon Huxley, MD, <Dictator> Algernon Huxley MD ELECTRONICALLY SIGNED 11/09/2013 9:36

## 2014-11-06 NOTE — Op Note (Signed)
PATIENT NAME:  [name], [name] MR#:  [id number] DATE OF BIRTH:  Sep 26, 1955  DATE OF PROCEDURE:  09/15/2013  PREOPERATIVE DIAGNOSIS: Thrombosis, right thigh arteriovenous graft.   POSTOPERATIVE DIAGNOSIS: Thrombosis, right thigh arteriovenous graft.  PROCEDURES PERFORMED: 1.  Contrast injection, right thigh arteriovenous graft.  2.  Mechanical thrombectomy with Trerotola device right thigh arteriovenous graft.  3.  Percutaneous transluminal angioplasty of the venous portion.  4.  Percutaneous transluminal angioplasty of the arterial portion.  5.  Placement of stents venous portion Viabahn 8 mm in diameter.  6.  Placement of stents arterial portion Viabahn 8 mm in diameter.   SURGEON: Hortencia Pilar, MD.   SEDATION:  Precedex drip with supplemental fentanyl.   ACCESS: Multiple sheaths, 2 antegrade, 2 retrograde initially 6-French sheaths were placed. Subsequently, 7-French sheaths were utilized.   CONTRAST USED: Isovue 150 mL.   FLUOROSCOPY TIME: 16 minutes.   INDICATIONS FOR PROCEDURE: Ms. Wickizer is a 59 year old woman who has end-stage renal disease and multiple medical problems and is having great difficulty with her AV access. She did undergo placement of a thigh graft but because of profound hypotension, it thrombosed. She is now undergoing attempted salvage. Today her blood pressure is ranging anywhere from the high 56E to 332 systolic. The risks and benefits for treatment were reviewed. The patient has agreed to proceed.   DESCRIPTION OF PROCEDURE: The patient is taken to special procedures and placed in the supine position. After adequate sedation is achieved, her right thigh is prepped and draped in sterile fashion. Ultrasound is placed in a sterile sleeve. Ultrasound is utilized due to lack of appropriate landmarks and to avoid vascular injury. Images are recorded of the permanent record. Under real-time visualization, the sheaths are placed using a micropuncture kit. Initial  sheath placement was along the outside of the graft that appeared on small injection and the sheath was repositioned. Subsequently it was proven that the catheters both in an antegrade sheath and a retrograde sheath were intraluminal. This was accomplished by advancing the catheter into the arterial system and imaging, as well as advancing the catheter into the venous system and imaging. It should also be noted that a catheter wire combination was advanced all the way to the aorta and her inflow was verified to be widely patent. Likewise, catheter was advanced into the IVC and imaging was performed with pullback to ensure that her venous system was also continuous and patent. Subsequently, 4000 units of heparin was given. A Trerotola device was used to perform thrombectomy throughout the loop graft. This was performed both antegrade and retrograde. Contrast injection demonstrated there are multiple areas of residual narrowing or stenosis, one at the apex of the curve, one associated with the sheath insertion site and one at the venous anastomosis. These are all treated either through the antegrade or the retrograde sheaths. Both sheaths were used to angioplasty the above-noted lesions. An 8 mm balloon was used. Inflations were typically to 14 to 16 atmospheres for approximately 1 minute. Followup imaging after balloon inflations still resulted in poor flow with multiple narrowing as it was elected to perform stent placement for failed angioplasty. This did necessitate repositioning both the sheaths. Initially the arterial sheath was removed and the sheath reinserted approximately 2 cm from the anastomosis. Again, arterial puncture site was performed with ultrasound. This site was selected as this did appear to be a normal portion of the graft and I was hoping to be able to place stents with just 1 sheath  in an antegrade direction. Initially, two 8 x 15 Viabahns and then an 8 x 10 were placed. These were then post  dilated with an 8 mm balloon. Followup imaging demonstrated there were still problems at the leading edge of the stent. There was no longer enough room to treat this and, therefore, a retrograde puncture was performed and an 8 x 5 Viabahn placed across this area, bringing it to within 1 cm of the arterial anastomosis. Essentially the entire graft has been realigned with Viabahn. It was postdilated with an 8 mm balloon. Final imaging via [place] catheter at the arterial anastomosis demonstrated a good flow of contrast through the AV graft with wide patency of the graft.   SUMMARY: Successful salvage of AV graft. The graft essentially is now completely line from the arterial to the venous with Viabahn stent 8 mm in diameter.    ____________________________ Katha Cabal, MD ggs:ce D: 09/16/2013 10:52:47 ET T: 09/16/2013 15:55:33 ET JOB#: [id number]  cc: Katha Cabal, MD, <Dictator> Katha Cabal MD ELECTRONICALLY SIGNED 10/06/2013 18:44

## 2014-11-06 NOTE — Consult Note (Signed)
PATIENT NAME:  [name], [name] MR#:  [id number] DATE OF BIRTH:  May 29, 1956  DATE OF CONSULTATION:  09/11/2013  REFERRING PHYSICIAN:  Dr. Sherryll Burger CONSULTING PHYSICIAN:  Stann Mainland. Sampson Goon, MD  REASON FOR CONSULTATION: Bacteremia.   HISTORY OF PRESENT ILLNESS: This is a 59 year old female who has end-stage renal disease, is on hemodialysis. She has had issues recently with vascular clotting of her dialysis catheter and has been receiving dialysis through a PermCath in her left groin area. She was admitted 02/25 with hypotension, pressures in the 70s. She was also having abdominal pain and diarrhea for the last 2 weeks. She was at dialysis today and I have seen her soon after this. She has been reporting pain all over, as well as ongoing chills. She also reports that she has had some pain at the left arm site where she has a small ulcer that she says is related to prior placement of an IV line.   Since admission, the patient has been having some chills that she says are worse right now after dialysis. She has been having bright red blood as well as per rectum. She is being admitted to manage for her lower GI bleed as well.   PAST MEDICAL HISTORY: 1.  End-stage renal disease on hemodialysis Tuesday, Thursday, Saturday.  2. Recent and recurrent clotting of vascular dialysis access site. Currently getting dialysis through left groin line.  3.  Chronic CHF, EF of 25%.  4.  Seizure disorder.  5.  Sick sinus syndrome status post pacemaker.  6.  Asthma.  7.  Anemia of chronic disease.  8.  Depression.  9.  Hemorrhoids.  10.  Chronic hypotension.   PAST SURGICAL HISTORY:  1.  Permanent pacemaker and AICD.  2.  Bilateral nephrectomy.  3.  Tonsillectomy.  4.  Cholecystectomy.   SOCIAL HISTORY: She resides at [facility]. She is usually wheelchair bound. No tobacco, alcohol or drugs.   FAMILY HISTORY: Positive for diabetes and stroke.   ALLERGIES: CONTRAST DYE, IODINE, MEDROL AND TRAZODONE.    MEDICATIONS: The patient has not been on antibiotics since admission a day and a half ago. Her other meds are reviewed. It does appear that as an outpatient she has been on doxycycline 100 mg every other day. She also uses Bactroban nasal ointment twice a day. It is unclear if she has been treated in the past for MRSA from my review. She is quite a poor historian.   PHYSICAL EXAMINATION: VITAL SIGNS: Temperature 97.9, heart rate 80, blood pressure 87/44, respirations 20, sat 99% on room air.  GENERAL: She is chronically ill-appearing. She is having a chill at this moment. She is complaining of pain all over.  HEENT: Pupils equal, round and reactive to light and accommodation. Extraocular movements are intact. Sclerae are anicteric.  Oropharynx is clear. NECK: Supple.  HEART: Tachy with some distant heart sounds.  LUNGS: Clear.  ABDOMEN: Soft, mild diffuse tenderness to palpitation.  EXTREMITIES: She has 1+ bilateral edema. Vascular access: She has a hemodialysis catheter in the left groin that appears normal with no drainage or tenderness. She has, in her left upper extremity, a failed AV graft. On her left forearm, she has approximately 1 cm necrotic-appearing ulcer that she says is from a prior IV site. There is no drainage to it. It is mildly tender.  NEUROLOGIC:  She is alert and oriented x 3.   LABORATORY DATA:  Reviewed. Blood cultures are pending, but apparently she has had gram-positive cocci  identified within white blood cells. White blood count on admission was 4.1, hemoglobin 6.5, platelets 175. LFTs: Only mildly elevated alk phos. She had a CT of her abdomen and pelvis, which showed no acute abnormalities. Chest x-ray showed no acute cardiopulmonary infiltrates.   IMPRESSION: A 59 year old chronically ill woman on dialysis with recurrent issues with vascular access, now getting dialysis through a left groin PermCath. She also has some ulceration in her left upper arm from prior IV and  access site. She was admitted with acute blood loss anemia and hypotension, as well as abdominal pain and diarrhea. Blood cultures are pending, but bacteria were noted intracellularly on white blood cells by pathology.   RECOMMENDATIONS: 1.  I have put in for another blood culture as she is having chills.  2.  I have asked the nurse to start immediately with vancomycin and Zosyn that were ordered.  3.  She likely has a source of infection either from the dialysis catheter site, the left upper extremity ulcer in her forearm or a GI source.  4.  I would recommend we continue broad-spectrum antibiotics until the organism is identified.   Thank you for the consult. I will be glad to follow with you. I am off for the weekend, but you can call me at [phone] with questions once the organism is identified.   ____________________________ Stann Mainland. Sampson Goon, MD dpf:dmm D: 09/11/2013 21:07:11 ET T: 09/11/2013 22:55:48 ET JOB#: [id number]  cc: Stann Mainland. Sampson Goon, MD, <Dictator> Michah Minton Sampson Goon MD ELECTRONICALLY SIGNED 09/21/2013 19:01

## 2014-11-06 NOTE — Consult Note (Signed)
Pt  with a sore on her left forearm from a previous infected iv, requests ointment and a bandage.  No new GI complaints, on nausea, no vomiting, no diarrhea.VSS afebrile, hgb stable at 7.4, plt 164, WBC 3.7. No new suggestions.  GI will sign off, reconsult if needed.  Electronic Signatures: Manya Silvas (MD)  (Signed on 27-Feb-15 16:40)  Authored  Last Updated: 27-Feb-15 16:40 by Manya Silvas (MD)

## 2014-11-06 NOTE — Consult Note (Signed)
PATIENT NAME:  [name], ENYEART MR#:  [id number] DATE OF BIRTH:  Mar 26, 1956  DATE OF CONSULTATION:  08/14/2013  CONSULTING PHYSICIAN:  Gladstone Lighter, MD  ADMITTING PHYSICIAN: Katha Cabal, MD  PRIMARY CARE PHYSICIAN: Deborra Medina, MD  REASON FOR CONSULTATION: Medical management for hypertension.   BRIEF HISTORY OF PRESENT ILLNESS: [name] is a 59 year old Caucasian female with known history of end-stage renal disease, on Tuesday, Thursday, Saturday hemodialysis; recurrent clotting and occlusions of her dialysis access, chronic systolic CHF with EF of 85%, seizure disorder, chronic anemia, sick sinus syndrome, status post pacemaker, depression, who has multiple admissions to the hospital for hypotension and is pressor-dependent while she is here for several days, and chronically has low blood pressure and had been extensively worked up in the past. Had an outpatient dialysis new graft placement on her right thigh today for having trouble accessing her old dialysis catheter, as it has been frequently clotted  off during dialysis recently. She had her graft put in this afternoon and postprocedural, the patient was hypotensive and was started on Neo-Synephrine drip, and is moved out to CCU and medical consult has been requested for the same. The patient denies any chest pain, dizziness. At this time, her only complaint is soreness at the surgical site.   PAST MEDICAL HISTORY: 1.  End-stage renal disease, on hemodialysis Tuesday, Thursday, Saturday.  2.  Frequent clotting and occlusions of her dialysis access.  3.  Chronic systolic CHF with EF of 02%.  4.  Seizure disorder.  5.  Sick sinus syndrome, status post pacemaker.  6.  Asthma.  7.  Anemia of chronic disease.  8.  Depression.  9.  Hemorrhoids.  10.  Chronic hypotension.   PAST SURGICAL HISTORY:  1.  Pacemaker placement and AICD placement.  2.  Bilateral nephrectomy.  3.  Tonsillectomy.  4.  Cholecystectomy.   ALLERGIES  TO MEDICATIONS: CONTRAST DYE, IODINE, MEDROL, AND TRAZODONE.   CURRENT MEDICATIONS: 1.  Fexofenadine 180 mg p.o. daily.  2.  Topamax 200 mg p.o. b.i.d.  3.  Tylenol 325 mg as needed for pain or fever.  4.  MiraLax p.r.n. for constipation.  5.  Xanax 0.25 mg b.i.d. p.r.n. for anxiety.  6.  Ferrous sulfate 325 mg p.o. daily.  7.  Norco 5/325 mg q.6 hours p.r.n. for pain.  8.  Primidone 125 mg at bedtime.  9.  Preparation-H as needed for hemorrhoids.  10.  Zoloft 1 tablet p.o. daily.  11.  Biotin 10 mL 3 times a day.  12.  Renvela 2 tablets 3 times a day.  13.  Systane Balance 1 drop twice daily.  14.  Symbicort 160/4.5, 2 puffs b.i.d.  15.  Nexium 40 mg p.o. b.i.d.  16.  Restasis 1 drop to affected eye every 12 hours. 17.  Dulcolax laxative as needed for constipation.  18.  Risperdal 0.5 mg once a day. 19.  Fludrocortisone 0.1 mg 4 tablets once a day.  20.  Keppra 1000 mg once daily and 500 mg on dialysis days. 21.  PhosLo 667 mg 3 capsules 3 times a day.  22.  Midodrine 10 mg 3 times a day.  23.  Pseudoephedrine 120 mg b.i.d.   SOCIAL HISTORY: The patient is a resident of [facility]. Usually is wheelchair-bound. No history of smoking or alcohol use.   FAMILY HISTORY: Diabetes and stroke run in the family, and mom died from car accident.   REVIEW OF SYSTEMS:    CONSTITUTIONAL: No  fatigue, weakness or lightheadedness.  EYES: No blurred vision, double vision, pain, inflammation or glaucoma.  EARS, NOSE, THROAT: No tinnitus, ear pain, hearing loss or epistaxis.  RESPIRATORY: No cough, wheeze or hemoptysis.  CARDIOVASCULAR: No chest pain, orthopnea, edema, palpitations or syncope.  GASTROINTESTINAL: No nausea, vomiting, diarrhea, abdominal pain. Positive for constipation.  GENITOURINARY: The patient is anuric, as patient is a dialysis patient.  ENDOCRINE: No polyuria, nocturia, thyroid problems.  HEMATOLOGIC: Chronic anemia. No easy bruising or bleeding.  SKIN: No acne, rash  or lesions.  MUSCULOSKELETAL: Currently having right thigh pain and chronic back pain.  NEUROLOGIC: No numbness, weakness. History of seizures present.  PSYCHOLOGICAL: No anxiety, insomnia, depression.   PHYSICAL EXAMINATION: VITAL SIGNS: Temperature 97.5 degrees Fahrenheit, pulse 64, respirations 21, blood pressure 72/39 with MAP of 50 and pulse ox 100% on 1 liter oxygen.  GENERAL: The patient is a heavily built, well-nourished female lying in bed, not in any acute distress.  HEENT: Normocephalic, atraumatic. Pupils equal, round, reacting to light. Anicteric sclerae. Extraocular movements intact. Oropharynx clear without erythema, mass or exudates.  NECK: Supple. No thyromegaly, JVD or carotid bruits. No lymphadenopathy.  LUNGS: Moving air bilaterally. Decreased bibasilar breath sounds. No wheeze or crackles. Clear to auscultation. No use of accessory muscles for breathing.  CARDIOVASCULAR: S1, S2, regular rate and rhythm. No murmurs, rubs or gallops.  ABDOMEN: Soft, nontender, nondistended. No hepatosplenomegaly. Normal bowel sounds.  EXTREMITIES: Trace pedal edema, no clubbing or cyanosis, 1+ dorsalis pedis pulses palpable bilaterally. Right thigh has a wound VAC on at the site of the new graft placement, and left groin has a dialysis catheter in place. NEUROLOGIC: Cranial nerves intact. No acute motor or sensory deficits. No ataxia.  PSYCHOLOGIC: The patient is awake, alert, oriented x 3.   LABORATORY DATA: WBC 3.8, hemoglobin 8.6, hematocrit 27.7, platelet count 155.   Sodium 135, potassium 4.0, chloride 98, bicarbonate 32, BUN 63, creatinine 7.7, glucose 88 and calcium of 10.1. INR is 1.0, PTT 27.7.   Chest x-ray showing no evidence of cardiopulmonary disease, and mild COPD faint changes seen.   IMPRESSION AND RECOMMENDATIONS: This is a 59 year old obese female with a history of end-stage renal disease on hemodialysis, seizure disorder, chronic hypotension, was from [facility], had  an outpatient procedure done for new graft placement on her right thigh for dialysis and post procedure was hypotensive and started on Neo-Synephrine drip, and medical consult requested for medical management.  1.  Hypotension: The patient has known history of chronic hypotension and pressor-dependent, multiple admissions for the same. Last one was about 3 weeks ago when she was in the hospital dependent on Levophed for almost 10 days. Started on Neo-Synephrine drip, but requiring more fluids through that, so will change to Levophed drip. Will restart her home Florinef, midodrine, and pseudoephedrine. Add stress dose steroids too. Monitor and try to wean off pressors as tolerated, as the patient has a history of being dependent on pressors.  2.  End-stage renal disease, on Tuesday, Thursday, Saturday hemodialysis: Due for dialysis tomorrow. Has a PermCath in left groin. which they will be using for dialysis tomorrow, and a new graft which was placed on the right thigh today. Follow up with vascular as recommended, and nephrology has been consulted for dialysis tomorrow. 3.  Seizure disorder: Appears to be stable and continue Keppra.  4.  Sick sinus syndrome: The patient is status post pacemaker.  5.  Chronic systolic congestive heart failure, status post automatic implantable cardiac defibrillator,  ejection fraction 25%, not on beta blockers or ACE inhibitors due to chronic hypotension at this time.  6.  Anemia of chronic disease: Hemoglobin is stable at this time.   CODE STATUS: Full code.   TIME SPENT ON CONSULTATION: 50 minutes.    ____________________________ Gladstone Lighter, MD rk:jcm D: 08/14/2013 18:50:49 ET T: 08/14/2013 21:35:18 ET JOB#: [id number]  cc: Gladstone Lighter, MD, <Dictator> Deborra Medina, MD Gladstone Lighter MD ELECTRONICALLY SIGNED 09/01/2013 15:01

## 2014-11-06 NOTE — Consult Note (Signed)
PATIENT NAME:  [name], BEEBE MR#:  [id number] DATE OF BIRTH:  1956-06-22  DATE OF CONSULTATION:  09/11/2013  REFERRING PHYSICIAN:   CONSULTING PHYSICIAN:  Jerrol Banana. Burt Knack, MD  CHIEF COMPLAINT: Hiatal hernia.  HISTORY OF PRESENT ILLNESS: We were asked to see this patient for a history of abdominal pain. She has also had some diarrhea. She was admitted with hypotension around the procedure, for which she was originally being admitted and that was declotting of a vascular graft. She now presents with no abdominal pain currently, but states that frequently after dialysis she has pain from the top of her head to the soles of her feet and relates it all to her hiatal hernia. She also states she has reflux.   PAST MEDICAL HISTORY: Extensive. She has end-stage renal disease on dialysis, CHF,  seizure disorder, morbid obesity, asthma, anemia, depression, hemorrhoids.   PAST SURGICAL HISTORY: Pacemaker with AICD, bilateral nephrectomies, cholecystectomy.   ALLERGIES: MULTIPLE, SEE CHART.   MEDICATIONS: Multiple, see chart.   FAMILY HISTORY: Noncontributory.   SOCIAL HISTORY: The patient does not smoke or drink.   REVIEW OF SYSTEMS: A 10 system review was performed and negative with the exception of that mentioned in the HPI.   PHYSICAL EXAMINATION: VITAL SIGNS: Afebrile and stable.  GENERAL: The patient is morbidly obese with a BMI of 50.  HEENT: No scleral icterus.  NECK: No palpable neck nodes.  CHEST: Clear to auscultation.  CARDIAC: Regular rate and rhythm.  ABDOMEN: A transverse, chevron-type scar and a midline scar, both with extensive ventral hernias and excoriations of the skin near the epigastrium juncture of both the midline and Chevron-type incision without purulence or erythema. These hernias are soft and nontender.  ABDOMEN: Soft and nontender.  EXTREMITIES: Moderate edema.  NEUROLOGIC: Grossly intact.  INTEGUMENT: No jaundice. She has a left femoral line in place and  multiple scars from prior lines.   LABORATORY VALUES: Thoroughly reviewed. No acute processes other than her anemia. Recent abdominal film is benign.   ASSESSMENT AND PLAN: This is a patient who is a difficult historian, but describes pain from the top of her head to the soles of her feet after dialysis. She relates most of this to her hiatal hernia and she has reflux disease. She has been told that she could not have antireflux procedure if she was expecting to be on a transplant list. She is followed at [facility] for her multiple medical problems as well. With this in mind and the lack of acute surgical needs, we will be happy to follow the patient as needed, but I see no need for acute surgical intervention in this patient with multiple chronic illnesses and no obvious acute processes.   ____________________________ Jerrol Banana Burt Knack, MD rec:sw D: 09/11/2013 22:39:39 ET T: 09/11/2013 23:13:32 ET JOB#: [id number]  cc: Jerrol Banana. Burt Knack, MD, <Dictator> Florene Glen MD ELECTRONICALLY SIGNED 09/12/2013 6:54

## 2014-11-06 NOTE — Discharge Summary (Signed)
PATIENT NAME:  [name], [name] MR#:  [id number] DATE OF BIRTH:  1955-10-04  DATE OF ADMISSION:  07/06/2013 DATE OF DISCHARGE:  07/17/2013   REASON FOR ADMISSION: Hypotension.   Please refer to interim discharge summary dictated on 07/15/2013 by Dr. Loletha Grayer, where he states that her admission date is 07/06/2013. The day of the interim discharge summary was on 07/15/2013, and the actual discharge of the patient is 07/17/2013.   PRIMARY CARE PHYSICIAN: Deborra Medina, MD  FINAL DIAGNOSES:  1. Persistent hypotension and shock, requiring pressors for several days in the Intensive Care Unit.  2. End-stage renal disease.  3. Chronic disease anemia.  4. Seizure disorder.  5. Chronic obstructive pulmonary disease.  6. Severe weakness.   REASON FOR ADMISSION: As stated on this discharge summary, was hypotension, which was persistent, associated with lightheadedness and weakness.   HOSPITAL COURSE: Please follow up details and lab work done by Dr. Leslye Peer on his interim discharge summary. Please follow up on hospital course, as it has not changed. All I have to add for the past couple of days is that the patient has been doing well. Her blood pressure has been maintained without pressor support. She has been off Levophed for several days. Her blood pressure has been 100/68. At one point today, her blood pressure was 66/40, but after recheck manually, it was in the 90s over 60s. The patient was totally asymptomatic through all of these last several days. She has not been tachycardic. She has not been dizzy or lightheaded. There have not been any signs of infection. The patient states that she wants to see Dr. Delana Meyer at some point to see what the plan is going to be with her fistula. The patient has had a procedure done recently, within the last month, that shows significant scar tissue on her fistula, for which she had a Perma-Cath on the left lower extremity. The patient is okay to go today, in  good condition. Recommendations have been given as far as keeping her blood pressure up, hydration to a certain point. With her fluid restrictions due to her dialysis, the hydration is going to be difficult, but if the patient is dizzy or lightheaded, she could drink an extra amount of fluid of 8 ounces. Keep her legs elevated if she is dizzy or lightheaded, lie down and rest.   MEDICATIONS AT DISCHARGE:  1. Fexofenadine 180 mg daily. 2. Topamax 200 mg twice daily.  3. Tylenol 325 mg as needed for pain or fever. 4. MiraLax 17 grams 2 times daily.  5. Xanax 0.25 mg twice daily as needed for anxiety and at bedtime for sleeping.  6. Ferrous sulfate 325 mg daily.  7. Norco 5/325 mg as needed for pain every 6 hours. 8. Primidone 125 mg at bedtime. 9. Preparation H as needed. 10. Zoloft 1 tablet once a day.  11. Biotin 10 mL 3 times a day. 12. Renvela 2 tablets 3 times daily. 13. Systane Balance 1 drop twice daily. 14. Symbicort 160/4.5 mg 2 puffs twice daily.  15. Nexium 40 mg twice daily. 16. Restasis 0.05 mg to the affected eye every 12 hours. 17. Dulcolax laxative 5 mg oral as needed for constipation. 18. Risperdal 0.5 mg once a day. 19. Xanax 0.5 mg daily. 20. Fludrocortisone 0.1 mg 4 tablets once a day. 21. Keppra 1000 mg once daily and 500 mg once daily on dialysis days.  22. Calcium acetate 667 mg 3 capsules 3 times daily. 23. Midodrine 10 mg  3 times daily.  24. Pseudoephedrine 120 mg every 12 hours.   DISPOSITION: The patient discharged to facility in good condition.   FOLLOWUP: Dr. Gabriel Carina, Dr. Delana Meyer and Dr. Candiss Norse in the next 2 weeks.  TIME SPENT: I spent about 45 minutes with this discharge. Please refer for details to the interim discharge summary dictated by Dr. Leslye Peer on 07/15/2013.   ____________________________ [place] Sink, MD rsg:lb D: 07/17/2013 13:35:50 ET T: 07/17/2013 14:05:28 ET JOB#: [id number]  cc: Gotha Sink, MD, <Dictator> Deborra Medina, MD Cristi Loron MD ELECTRONICALLY SIGNED 08/07/2013 1:04

## 2014-11-06 NOTE — Consult Note (Signed)
Brief Consult Note: Diagnosis: abd pain.   Patient was seen by consultant.   Recommend further assessment or treatment.   Comments: HH/GERD/ morbid obesity/CRF/VH no acute processes and no surgical plans.  Electronic Signatures: Florene Glen (MD)  (Signed 27-Feb-15 22:32)  Authored: Brief Consult Note   Last Updated: 27-Feb-15 22:32 by Florene Glen (MD)

## 2014-11-06 NOTE — H&P (Signed)
PATIENT NAME:  [name], COOMBS MR#:  [id number] DATE OF BIRTH:  05/10/1956  DATE OF ADMISSION:  06/29/2014  REFERRING PHYSICIAN:  Verdia Kuba. Paduchowski, MD  PRIMARY CARE PHYSICIAN:  Melvyn Novas. Henry-Smith, MD   CHIEF COMPLAINT:  Weakness.   HISTORY OF PRESENT ILLNESS:  A 59 year old Caucasian female with a past medical history of end-stage renal disease on hemodialysis Tuesday, Thursday, and Saturday; hypertension; and congestive heart failure with ejection fraction of 25%, presenting with weakness. She describes 3 weeks of essentially progressive weakness, however, substantially worsened. She completed hemodialysis today, and according to her, she essentially had a syncopal episode, where she passed out after completion of dialysis. According to dialysis records, however, they state that she threw herself upon the floor and was asymptomatic at that time. She was sent to the Emergency Department for further workup and evaluation. In the ER, she was noted to be fairly normal as far as physical exam and laboratory data; however, she was noted to be hypotensive. Despite blood pressures down to a systolic of 31D, she was still conversant without any further difficulty. In looking through her medical records for her medication record, she is supposed to be on midodrine 3 times daily. She states that she is taking it once daily, however, unsure of how accurate that statement actually is. She is asymptomatic at this time. Denies any chest pain, palpitations, shortness of breath, or further symptomatology.   REVIEW OF SYSTEMS: CONSTITUTIONAL:  Denies fevers or chills. Positive for fatigue and weakness.  EYES:  Denies blurred vision, double vision, or eye pain.  EARS, NOSE, AND THROAT:  Denies tinnitus, ear pain, or hearing loss. RESPIRATORY:  Denies cough or shortness of breath.  CARDIOVASCULAR:  Denies chest pain, palpitations, or edema.  GASTROINTESTINAL:  Denies nausea, vomiting, diarrhea, or abdominal  pain.  GENITOURINARY:  Denies dysuria or hematuria.  ENDOCRINE:  Denies nocturia or thyroid problems.  HEMATOLOGIC:  Denies easy bruising or bleeding.  SKIN:  Denies rash or lesions.  MUSCULOSKELETAL:  Denies pain in the neck, back, shoulder, knees, or hips or arthritic symptomology.  NEUROLOGIC:  Denies paralysis or paresthesias.  PSYCHIATRIC:  Denies anxiety or depressive symptoms.   Otherwise, full review of systems performed by me is negative.   PAST MEDICAL HISTORY:  Essential hypertension; end-stage renal disease on hemodialysis Tuesday, Thursday, and Saturday; depression not otherwise specified; congestive heart failure, systolic, with EF of 17%; COPD, non-O2 dependent,  SOCIAL HISTORY:  Currently resides at [facility] and uses a wheelchair for ambulation. Denies any alcohol, tobacco, or drug use.   FAMILY HISTORY:  Positive for diabetes as well as stroke.   ALLERGIES:  CONTRAST DYE, IODINE,  AND TRAZODONE.   HOME MEDICATIONS:  Include Renvela 800 mg p.o. on Monday, Wednesday, and Friday; Renvela 800 mg on Tuesday, Thursday, and Saturday; Restasis 0.05% ophthalmic solution to affected eye twice daily; Risperdal 0.5 mg p.o. at bedtime; saline nasal spray every 8 hours as needed; sertraline 100 mg p.o. daily; sorbitol 30 mL as needed for constipation; Symbicort 160/4.5 mcg inhalation 2 puffs b.i.d.; Topamax 200 mg p.o. b.i.d., tramadol 50 mg p.o. q. 8 hours as needed for pain; triamcinolone cream 0.025% topical daily as needed, Tylenol 325 mg 2 tabs p.o. q. 6 hours as needed for pain, Xanax 1 mg taken prior to dialysis on Tuesday, Thursday, and Saturday; Xanax 0.5 mg p.o. b.i.d. as needed for anxiety, Zofran 4 mg p.o. q. 8 hours as needed for nausea and vomiting; Zoloft 50 mg  p.o. at bedtime in addition to the above; Klonopin 0.5 mg p.o. at bedtime; Combivent 20/100 mcg inhalation 2 puffs 4 times daily; cyclobenzaprine 10 mg p.o. b.i.d. as needed; Cymbalta 30 mg p.o. daily; doxycycline  100 mg p.o. every other day; Dulcolax 5 mg 2 tablets p.o. at bedtime; Eliquis 2.5 mg p.o. b.i.d.; fexofenadine 180 mg p.o. daily; fludrocortisone 0.1 mg p.o. 4 tablets in the morning; Keppra 1000 mg 1-1/2 tablets daily; Keppra 1000 mg p.o. daily; Lamictal 100 mg p.o. at bedtime; levetiracetam 500 mg 3 times weekly after dialysis; midodrine 10 mg p.o. 3 times daily; MiraLax 17 grams daily; multivitamin daily; Norco 5/325 p.o. q. 6 hours as needed for pain; pantoprazole 40 mg p.o. b.i.d.; Preparation H as needed; primidone 250 mg 1/2 tab p.o. at bedtime.   PHYSICAL EXAMINATION: VITAL SIGNS:  Temperature 97.6, heart rate 61, respirations 16, blood pressure 99/71, lowest blood pressure recorded 56/31 saturating 97% on room air. Weight 145.2 kg, BMI 58.6.  GENERAL:  Obese Caucasian female, currently in no acute distress.  HEAD:  Normocephalic, atraumatic.  EYES:  Pupils are equal, round, and reactive to light. Extraocular muscles are intact. No scleral icterus.  MOUTH:  Moist mucous membranes. Dentition is intact. No abscess noted.  EARS, NOSE, AND THROAT:  Clear. No exudates. No external lesions. NECK:  Supple. No thyromegaly. No nodules. No JVD. PULMONARY:  Clear to auscultation bilaterally without wheezes, rales, or rhonchi. No use of accessory muscles. Good respiratory effort.  CHEST:  Nontender to palpation.  CARDIOVASCULAR:  S1 and S2, regular rate and rhythm. No murmurs, rubs, or gallops. No edema. Pedal pulses are 2+ bilaterally.  GASTROINTESTINAL:  Soft, nontender, nondistended. No masses. Positive bowel sounds. No hepatosplenomegaly.  MUSCULOSKELETAL:  No swelling, clubbing, or edema. Range of motion is full in all extremities. NEUROLOGIC:  Cranial nerves II through XII are intact. No gross focal neurologic deficits. Sensation is intact. Reflexes are intact.  SKIN:  No ulcerations, lesions, rashes, or cyanosis. Skin is warm and dry. Turgor is intact.  PSYCHIATRIC:  Mood and affect are within  normal limits. She is alert and oriented x 3. Insight and judgment are intact.   LABORATORY DATA:  Sodium 130,  chloride 96, bicarbonate 28, BUN 21, creatinine 3.92, glucose 122. Troponin is less than 0.02. WBC is 8.8, hemoglobin 11, platelets 177,000.   ASSESSMENT AND PLAN:  A 59 year old Caucasian female with history of end-stage renal disease on hemodialysis, presenting with symptomatic hypotension.   1.  Symptomatic hypotension. We will provide gentle IV fluid hydration. I am going to redose midodrine now, If blood pressure remains low, will consult endocrine. She follows with Dr. Alferd Patee. However, it seems that she may be inappropriately taking her medications.  2.  End-stage renal disease on hemodialysis, completed hemodialysis today. If she remains in the hospital by Thursday, we will need a nephrology consult for continuation of dialysis.  3.  copd, not in exacerbation: continue home medications. 4.   gerd: proton pump inhibitor 5.  Venous thromboembolism prophylaxis with Eliquis.   CODE STATUS:  The patient is a full code.   TIME SPENT:  45 minutes.     ____________________________ Aaron Mose. Hower, MD dkh:nb D: 06/29/2014 22:23:14 ET T: 06/29/2014 23:08:18 ET JOB#: [id number]  cc: Aaron Mose. Hower, MD, <Dictator> DAVID Woodfin Ganja MD ELECTRONICALLY SIGNED 06/30/2014 2:54

## 2014-11-06 NOTE — Consult Note (Signed)
PATIENT NAME:  [name], STREICHER MR#:  [id number] DATE OF BIRTH:  11/20/1955  DATE OF CONSULTATION:  09/10/2013  CONSULTING PHYSICIAN:  Manya Silvas, MD  The patient is a 59 year old female who has had chronic kidney disease. She was noted to have hypotension in the ER and gave a history of abdominal pain and diarrhea, which had been occurring since her hospital discharge on February 11.    She reported bright red blood per rectum, but sometimes she has dark stools. She is on iron, and she does have hemorrhoids. She says that she has hemorrhoidal cream ordered where she is a patient, but they do not give it to her unless she asks for it, and she does not usually ask for it.    The patient has a history of endoscopy in 1992 showing ulcer disease. She had a colonoscopy many years ago. Currently, when she has some blood in the bowel movement, she will see blood with wiping.   The patient had stool that was heme negative, un-determination after admission.   REVIEW OF SYSTEMS: No vomiting. No dysphagia. No diarrhea today. She usually moves her bowels 1 to 3 times a day, often moves her bowels after eating. Although she is obese, she says she has lost her appetite for a while because of multiple medical problems.   PAST MEDICAL HISTORY:  1.  End-stage renal disease. Dialysis Tuesday, Thursday, Saturday.  2.  Chronic systolic CHF with ejection fraction 25%. 3.  Seizure disorder.  4.  She is status post pacemaker.  The patient got letter from [facility] that batteries need to be replaced in her pacemaker.  5.  Asthma.  6.  Anemia of chronic disease.  7.  Depression.  8.  Hemorrhoids.  9.  Chronic hypotension.   PAST SURGICAL HISTORY:  1.  Gallbladder removal.  2.  Tonsillectomy.  3.  Bilateral nephrectomies.  4.  Pacemaker and AICD placement.   ALLERGIES: IV CONTRAST, IODINE, MEDROL AND TRAZODONE.   SOCIAL HISTORY: She is a resident of [facility].  MEDICATIONS AT HOME:  Are noted in  the history and physical. Pertinent medicines for my consultation are  Zoloft 50 mg a day, Xanax 0.25 twice a day, Topamax 200 mg a day, Symbicort 2 puffs a day, midodrine 10 mg 3 times a day, Lamictal 25 mg a day, Keppra 1000 mg on Tuesday, Thursday, Saturday, 1500 mg Monday, Wednesday, Friday; ferrous sulfate 325 twice daily, Dulcolax 2 tablets at bedtime as needed, doxycycline 100 mg every other day for rosacea.   PHYSICAL EXAMINATION: GENERAL: Black female in no acute distress, eating some lunch.  VITAL SIGNS:  Temp 97.5, pulse 60, respirations 14 to 30. Blood pressure varies, 77/44 this morning, 138/75 at this time. O2 sat 95% on room air.  HEENT: Sclerae anicteric. Conjunctivae negative. The head and face shows rosacea skin changes.  CHEST: Clear in the anterior fields.  HEART:  Shows a 1 to 2/6 systolic murmur.  ABDOMEN: Very obese, nontender to palpation.  EXTREMITIES: No significant edema.  SKIN: Warm and dry.  PSYCHIATRIC: Mood and affect are appropriate, somewhat depressed.   LABORATORY DATA: Glucose 80, BUN 33, creatinine 5.95, sodium 134, potassium 4.4, chloride 101, CO2 of 27, calcium 8.4, magnesium 2, total protein 6.1, albumin 2.7, total bili 0.5, alk phos 121, SGOT 13, SGPT 12. White count 4.2, hemoglobin 7.4, platelet count 161. C. diff for  stool is negative. Occult blood in feces is negative.   ASSESSMENT AND PLAN: She  appears to have systolic heart failure and she has labile blood pressure. She is sensitive when she is anemic to changes in blood pressure. I think it would be a good idea to keep her hemoglobin around 8 range, if possible.    With her stool being negative for blood on the stool Hemoccult cards, I doubt any significant bleeding going on here. Due to her history of ulcer disease and history of black stools, even though she is on iron pills, I would start her on empiric PPI twice a day for a month and then cut down to 1 a day and give it indefinitely. Given her  multiple medical problems at this time, I see no indication for an urgent upper endoscopy or colonoscopy. It is highly recommended that she be put back in contact with [facility] for replacement of her batteries with her pacemaker. I will follow with you.     ____________________________ Manya Silvas, MD rte:dmm D: 09/10/2013 12:58:00 ET T: 09/10/2013 13:22:23 ET JOB#: [id number]  cc: Katha Cabal, MD Benay Pillow, MD Vipul S. Manuella Ghazi, MD Manya Silvas, MD, <Dictator> in [place], [place] Manya Silvas MD ELECTRONICALLY SIGNED 09/24/2013 16:40

## 2014-11-06 NOTE — Op Note (Signed)
Stone NAME:  [name], [name] MR#:  [id number] DATE OF BIRTH:  10/29/1955  DATE OF PROCEDURE:  03/19/2014  PREOPERATIVE DIAGNOSES:  1. Poorly functioning dialysis access.  2. End-stage renal disease requiring hemodialysis.  3. Superior vena cava syndrome.  4. Morbid obesity.   POSTOPERATIVE DIAGNOSES: 1. Poorly functioning dialysis access.  2. End-stage renal disease requiring hemodialysis.  3. Superior vena cava syndrome.  4. Morbid obesity.   PROCEDURE PERFORMED: 1. Contrast injection of right thigh arteriovenous graft 2. Percutaneous transluminal angioplasty of [name] arterial access portion of [name] graft. 3. Percutaneous transluminal angioplasty of [name] venous access portion of [name] graft, separate and distinct lesion.  PROCEDURE PERFORMED BY: Dr. Ronalee Belts.  SEDATION: Versed 4 mg plus fentanyl 150 mcg administered IV. Continuous ECG, pulse oximetry, and cardiopulmonary monitoring is performed throughout [name] entire procedure by [name] interventional radiology nurse. Total sedation time was 1 hour, 30 minutes.   ACCESS: A 6 French sheath retrograde direction, AV graft.   CONTRAST USED: Isovue 20 mL.   FLUOROSCOPY TIME: 5.1 minutes.   INDICATIONS: [name] is a 59 year old woman who is currently maintained using a right thigh AV graft. She has had multiple problems with access and multiple episodes of thrombosis for which she has been placed on Eliquis. On examination in [name] office and evaluation with noninvasive studies, she was found to have 2 separate and distinct lesions that were hemodynamically significant and she is therefore undergoing treatment before she thromboses her graft. Risks and benefits were reviewed. All questions answered. [name] agrees to proceed.   DESCRIPTION OF PROCEDURE: [name] is taken to special procedures and placed in a supine position. After adequate sedation is achieved, [name] right thigh graft is prepped and draped in a sterile fashion. Appropriate  timeout is called.   Access is then obtained in a retrograde direction using a micropuncture needle very proximally on [name] venous limb and [name] wire is advanced. [name] 6 French sheath is inserted and hand injection of contrast is used to demonstrate [name] fistula. High-grade stenosis is noted which correlates with [name] noninvasive studies. Three thousand units of heparin are given.   Initially a 6 x 4 Lutonix balloon is advanced over [name] wire and then used to angioplasty [name] arterial portion. Angioplasty is to 14 atmospheres for 2 minutes. A new separate Lutonix balloon is then advanced over [name] wire and used to angioplasty [name] venous lesion. Again, 14 atmospheres for 2 minutes.   Followup imaging demonstrates modest improvement and subsequently, an 8 x 4 Dorado balloon was used to angioplasty both lesions, performing angioplasty to 20 atmospheres for 1-1/2 minutes on [name] arterial lesion and then repositioning [name] balloon in its entirety and performing similar angioplasty on [name] venous. Both lesions were still significantly undersized and a 10 x 4 Dorado balloon was used to treat both lesions in a similar fashion.   A KMP catheter was then advanced over [name] wire and placed at [name] arterial anastomosis and [name] hand injection of contrast was used to image [name] loop graft in its entirety from [name] catheter positioned in [name] actual anastomosis all [name] way through [name] loop. Excellent result was noted and [name] catheter is removed, pursestring suture is placed around [name] sheath and [name] sheath is removed. There are no immediate complications.   INTERPRETATION: Initial views of [name] AV loop graft demonstrate 2 separate and distinct greater than 80% lesions. Angioplasty is performed ultimately, using a Lutonix balloon initially, and then a 10-mm Dorado balloon. High pressure inflations  are utilized and this treats both lesions quite well.   SUMMARY: Successful salvage of right thigh AV graft.     ____________________________ Katha Cabal, MD ggs:lt D: 03/19/2014 17:22:56 ET T: 03/19/2014 18:59:04 ET JOB#: [id number]  cc: Katha Cabal, MD, <Dictator> Unknown CC  Katha Cabal MD ELECTRONICALLY SIGNED 03/23/2014 22:40

## 2014-11-06 NOTE — Discharge Summary (Signed)
PATIENT NAME:  [name], [name] MR#:  [id number] DATE OF BIRTH:  March 10, 1956  DATE OF ADMISSION:  08/14/2013 DATE OF DISCHARGE:  02/11//2015  DISCHARGE DIAGNOSES: 1.  Hypotension.  2.  End-stage renal disease requiring hemodialysis.  3.  Superior vena cava syndrome.  4.  Complication dialysis device with multiple failed access.  5.  End-stage renal disease requiring hemodialysis.   PROCEDURE PERFORMED: Creation of a right femoral-to-femoral arteriovenous loop graft 08/14/2013.   CONSULTATIONS: 1.  [facility] physicians medical management.  2.  Nephrology for management of dialysis.   HISTORY OF PRESENT ILLNESS:  [name] is a 59 year old woman who has been catheter dependent and is now returning to the hospital for creation of an AV access. The intended procedure was planned to be performed as an outpatient.   HOSPITAL COURSE: On the day of admission, she underwent successful creation of a femoral-to-femoral AV loop graft using an Artegraft. Postoperatively she was found to have profound hypotension and was deemed not acceptable for discharge. She is therefore admitted for treatment of her hypotension and continued dialysis therapy. Over the next 11 days, she has waxed and waned. She will go to dialysis which has been performed in an uninterrupted fashion via her left femoral Perm-A-Cath and then return to the floor with systolic pressures anywhere from 50 to 70. Unfortunately, examination on postoperative day #2 revealed that the thrill and bruit in the AV graft in the right thigh had ceased and that the graft is now clotted.   On hospital day #12, the patient is stable. She is at her baseline for blood pressure. The medical service has signed off stating this is as much as they can do and nephrology has documented that she has maximized her hypertensive medications. At this time, she is in stable condition. She is at her baseline. She is chronically debilitated. She is tolerating her renal diet.  She is receiving her dialysis therapy. It is felt that she has returned to her usual state of health and is ready for transfer to skilled nursing.   ____________________________ Katha Cabal, MD ggs:ce D: 08/25/2013 17:58:29 ET T: 08/25/2013 18:08:46 ET JOB#: [id number]  cc: Katha Cabal, MD, <Dictator> Katha Cabal MD ELECTRONICALLY SIGNED 08/27/2013 17:16

## 2014-11-06 NOTE — Consult Note (Signed)
PATIENT NAME:  [name], [name] MR#:  [id number] DATE OF BIRTH:  October 30, 1955  DATE OF CONSULTATION:  07/13/2013  REFERRING PHYSICIAN:  Max Sane, MD CONSULTING PHYSICIAN:  A. Lavone Orn, MD  CHIEF COMPLAINT: Hypotension.   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 She has had persistent hypotension during hospitalization requiring pressor support. She has had a work-up for infection which has essentially been negative. She has had difficulty tolerating dialysis in the past due to hypotension and was taking midodrine for interdialysis hypotension prior to admission. Midodrine 10 mg t.i.d. was added on the day of admission. On December 23,  hydrocortisone 100 mg q. 6 hours was added. On December 27, Florinef 0.2 mg daily was added. Since that time hydrocortisone has been tapered down, and she is now on 50 mg daily and fludrocortisone has been increased to now 0.4 mg daily. She remains on Levophed for  pressor support and her most recent blood pressure was 110/66. The patient denies having taken oral steroids prior to hospitalization. Again she has been on midodrine and for some time, it is not clear when this was started. The patient claims she was taking Midrodrine 10 mg every day. She does report dizziness and lightheadedness on standing. She reports generalized weakness.   PAST MEDICAL HISTORY: 1.  End-stage renal disease, status post bilateral nephrectomy.  2.  Tuberous sclerosis.  3.  Congestive heart failure (EF 25%).   4.  Seizure disorder.  5.  History of sick sinus syndrome status post pacemaker.  6.  Asthma.  7.  Anemia.  8.  Secondary hyperparathyroidism.  9.  History of C. difficile sepsis July  2014.  10.  History of MRSA.  11.  Depression.   PAST SURGICAL HISTORY: 1.  Pacemaker/automatic implantable cardiac defibrillator placement.  2.  Bilateral nephrectomy.  3.  Tonsillectomy.  4.  Cholecystectomy.  SOCIAL HISTORY: The patient resides [facility]. No use of tobacco, alcohol or illicit drugs.   FAMILY HISTORY: The patient is one of eight children, she has seven brothers. She is not aware of any known medical problems among her siblings. She does have a cousin with tuberous sclerosis.   ALLERGIES: IODINE, TRAZODONE   REVIEW OF SYSTEMS:  GENERAL: She denies fevers. She has weakness.  HEENT: Denies blurred vision or sore throat.  NECK: Denies neck pain or dysphagia.  CARDIAC: Denies chest pain or palpitation.  PULMONARY: Denies cough or shortness of breath.  ABDOMEN: Denies abdominal pain. Reports good appetite.  EXTREMITIES: Denies leg swelling.  SKIN: Denies recent skin changes or pruritus.  ENDOCRINE:  Denies heat or cold intolerance.  HEMATOLOGIC: She has had easy bruisability. Denies recent bleeding.   PHYSICAL EXAMINATION: VITAL SIGNS: Height 66.9 inches, weight 317 pounds, BMI 49. Temperature 97.9, pulse 62, respirations 14, blood pressure 110/66, pulse oximetry 9% on 2 liters O2 by nasal cannula.  GENERAL: Morbidly obese, disheveled, no acute distress.  HEENT:  Oropharynx is clear. MMM. SKIN: Papular lesions along the neckline and face were easily visualized, old ecchymotic lesions on the extremities.  NECK: Supple.  CARDIAC: Regular rate and rhythm. There is a left groin central catheter in place.  PULMONARY: Clear anteriorly bilaterally. Good inspiratory effort.  ABDOMEN: Diffusely soft, nontender, and nondistended.  EXTREMITIES:  No peripheral edema is detectable.  NEURO: Extraocular movements are intact. A&O x3. PSYC: CaLm and cooperative.  LABORATORY DATA: Glucose 135, BUN 63, creatinine 7.61, sodium 125, potassium 5.1, chloride 89, bicarbonate 27,  albumin 3.3.  December 26, at 2:48 p.m.: Serum cortisol 106.7. December 26, at 2:57 a.m.: Hematocrit 27.2%.   ASSESSMENT: [name] with end-stage renal disease, congestive heart failure who has had persistent refractory hypotension. Causes of hypotension in this individual may include heart failure, renal failure, adrenal insufficnecy and autonomic dysfunction. There is no evidence of infection as source of sepsis.    RECOMMENDATIONS:  1.  No known cause of adrenal insufficiency has been identified. I do recommend tapering off the hydrocortisone and after 2-3 days, assessing a morning cortisol or a cosyntropin stimulation cortisol level. We cannot do this until she has been off the hydrocortisone for several days. Adrenal insufficiency however, is felt to be unlikely.  2.  Fludrocortisone can be helpful in individuals with adrenal insufficiency and hypotension or even and those to have orthostatic hypotension off of unknown cause. There is little to be gained by increasing the dose. I would continue 0.4 mg dose as you are.  3.  Midodrine at the current dose is reasonable as well and should be continued.  4.  Agree with tapering off Levophed as you are doing.   5.  There are minimal additional supportive medications that may be offered.   I will follow along with you.   ____________________________ A. Lavone Orn, MD ams:cc D: 07/13/2013 17:09:01 ET T: 07/13/2013 19:03:40 ET JOB#: [id number]  cc: A. Lavone Orn, MD, <Dictator> Sherlon Handing MD ELECTRONICALLY SIGNED 07/18/2013 15:00
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
## 2014-11-06 NOTE — Consult Note (Signed)
PATIENT NAME:  [name], [name] MR#:  [id number] DATE OF BIRTH:  1955-12-27  DATE OF CONSULTATION:  07/10/2013  REFERRING PHYSICIAN:      Dr. Brigitte Pulse CONSULTING PHYSICIAN:  Dwayne D. Callwood, MD  Initially admitted on 07/06/2013 for hypertension, lightheadedness and weakness.   REASON FOR CONSULTATION: Was persistent hypotension, 07/10/2013.   HISTORY OF PRESENT ILLNESS: The patient is an obese white female with multiple medical problems; end-stage renal disease hemodialysis on Matrenone secondary to chronic hypotension. She has had chronic systolic heart failure with EF of 25%, pacemaker placement, seizure disorder, was sent to the hospital from dialysis center for feeling lightheaded, dizzy, hypotensive systolics in the 46K. In the Emergency Room, blood pressure was fluctuating in the 50s to 70s. Still feeling lightheaded while in Trendelenburg. Did not get dialysis. Did not have any shortness of breath, chest pain, diarrhea or bleeding. Denied any pain. Usually gets her dialysis on Tuesday, Thursday, Saturday, due to holiday schedule has been changed to Monday and Friday. She was last hospitalized in July for septic shock secondary to C. difficile. Initial blood pressure was 120 at the time, but blood pressure is 0 on flow, long history of AV graft trouble, recently had one removed by Dr. Delana Meyer because of status sepsis. There was some sclerosis around the veins with occlusion. She has been getting dialysis through a left femoral dialysis catheter. She sees Dr. Hassell Done of nephrology and because of her persistent hypotension and need for pressors, cardiology consultation was recommended.   REVIEW OF SYSTEMS: No blackout spells or syncope. No significant nausea or vomiting. No fever, no chills, no sweats. No weight loss. No weight gain. No hemoptysis or hematemesis. No bright red blood per rectum. She has had hypotension, weakness, dizziness, fatigue.  No bleeding. No fever or chills. No significant nausea  or vomiting.   PAST MEDICAL HISTORY: End-stage renal disease on hemodialysis Tuesday, Thursday, Saturday, recurrent clotting of AV valve graft, chronic systolic heart failure, cardiomyopathy, seizure disorder, sick sinus syndrome, asthma, chronic anemia, MRSA and sepsis, depression, hemorrhoids, chronic hypotension.   PAST SURGICAL HISTORY: Pacemaker placement, AICD placement,  bilateral nephrectomy, tonsillectomy, cholecystectomy.   SOCIAL HISTORY: No smoking or alcohol consumption. Resides at [facility].   FAMILY HISTORY: Diabetes, stroke.   ALLERGIES: CONTRAST DYE, IODINE, MEDROL, TRAZODONE.   MEDICATIONS: Biotene mouthwash 3 times a day, doxycycline 1 capsule every other day, Dulcolax as needed, iron twice a day, fexofenadine 180 mg once a day, Keppra 1000 mg once a day on Monday, Wednesday, Friday, Lamictal 25 once a day, Midodrine  10 mg 3 times a day, MiraLAX twice a day, Nexium 40 mg twice a day, Norco 325/5 1 tablet every six hours. If phos low, 2 capsules  twice a day. Renvela two capsules twice a day, primidone 250 mg 1/2 tablet once a day, Risperdal 0.5 once a day orally, Risperdal intramuscular injection extended release, Symbicort twice a day, Topamax 200 mg twice a day, Tylenol p.r.n., Xanax p.r.n., Zoloft 50 daily.   PHYSICAL EXAMINATION: VITAL SIGNS: Blood pressure 70/40, pulse of 60, respiratory rate of 14, afebrile.  HEENT: Normocephalic, atraumatic. Pupils equal and reactive to light.  NECK: Supple. No significant JVD, bruits or adenopathy.  LUNGS: Clear to auscultation and percussion with no rhonchi or rales.   HEART: Regular, bradycardic. Systolic ejection murmur positive at the apex. Positive S3. Soft S4. PMI displaced laterally.  ABDOMEN: Benign.  EXTREMITIES: Within normal limits, except for AV fistula graft.  NEUROLOGIC: Intact.  SKIN: Normal.  LABORATORIES: Glucose of 100, BUN of 63, creatinine of 8, sodium 131, potassium 5.3. LFTs negative. Troponin  negative. White count 7.9, hemoglobin of 8.4, platelet count of 131.   EKG: Normal sinus rhythm at 60, nonspecific ST-T changes. Chest x-ray negative.   ASSESSMENT: Chronic persistent severe hypotension unclear etiology, end-stage renal disease, cardiomyopathy, history of congestive heart failure, anemia, seizure disorder, obesity, sick sinus syndrome, history of Methicillin-resistant Staph Aureus and sepsis.   PLAN: 1.  Agree with admit. Agree with Intensive Care Unit, agree with pressors. Agree with aggressive evaluation for possible etiology of her hypotension and sepsis. I am concerned she has an underlying infection that is yet to be identified with her graft changes and clots.  Again, this may be another Staph aureus infection. I also agree with the broad spectrum antibiotics, fluid resuscitation and pressors. 2. For volume depletion, would recommend rehydration, but be careful not to put in volume overload since dialysis will be required. The patient is mentating well. She is reasonably comfortable, even though blood pressures are significantly low. .  3.  Continue pacemaker placement, automatic implantable cardiac defibrillator placement for sick sinus syndrome.  4.  Congestive heart failure. Continue heart failure therapy. Will have to hold off on afterload reducing agents with diuretics because of her relative hypotension. I do not expect acute coronary syndrome for the etiology of her hypotension.  5.  Continue medications for seizures.  6.  Consider ID consult for possible sepsis.  7.  No evidence of Clostridium difficile infection which she has had recently.  8.  Continue inhalers as necessary for asthma.  9. Follow-up anemia.  10.  Will treat the patient conservatively from a cardiac standpoint at this point. Echocardiogram may be helpful.   ____________________________ Loran Senters. Clayborn Bigness, MD ddc:NTS D: 07/10/2013 23:50:07 ET T: 07/11/2013 05:23:03 ET JOB#: [id number]  cc: Dwayne  D. Clayborn Bigness, MD, <Dictator> Yolonda Kida MD ELECTRONICALLY SIGNED 08/17/2013 9:38

## 2014-11-07 NOTE — H&P (Signed)
PATIENT NAME:  [name], DROMGOOLE MR#:  [id number] DATE OF BIRTH:  11-11-1955  DATE OF ADMISSION:  09/15/2011  PRIMARY CARE PHYSICIAN: Dr. Benay Pillow    CHIEF COMPLAINT: Recurrent seizures.   HISTORY OF PRESENT ILLNESS: This is a 59 year old female who comes into the hospital today after having recurrent seizures over the past two days. The patient apparently had a seizure yesterday and was brought to the ER. She was worked up and discharged home earlier this morning. She went home and then went to dialysis earlier today. At dialysis the patient was noted to have two episodes of seizures and then sent back to the ER. Upon calling the dialysis facility, they described the episode that shortly after dialysis when they tried to get the patient up she started to shake vigorously mostly in the upper extremities. Her eyes were closed and she was having some tardive dyskinesia movements in her mouth. It lasted for about a minute and then she was quite lethargic. They tried to do a sternal rub. She was barely arousable at which point they sent her over to the ER for further evaluation. Presently the patient is alert, awake, oriented and back to her baseline mental status. She does have a history of seizure disorder, has been compliant with her meds but has not seen her neurologist in quite a while. She currently is on primidone and Topamax for her seizures and has been compliant with her meds as mentioned. She denies any fevers, chills, cough, chest pain, shortness of breath, nausea, vomiting, abdominal pain, or any other associated symptoms. She does complain of a mild headache but no other associated symptoms presently.   REVIEW OF SYSTEMS: CONSTITUTIONAL: No documented fever. No weight gain, no weight loss. EYES: No blurry or double vision. ENT: No tinnitus or postnasal drip. No redness of the oropharynx. RESPIRATORY: No cough, no wheeze, no hemoptysis. CARDIOVASCULAR: No chest pain, no orthopnea, no  palpitations, no syncope. GI: No nausea, no vomiting, no diarrhea, no abdominal pain, no melena, no hematochezia. GU: No dysuria, no hematuria. ENDOCRINE: No polyuria or nocturia. No heat or cold intolerance. HEME: No anemia, no bruising, no bleeding. INTEGUMENTARY: No rashes, no lesions. MUSCULOSKELETAL: No arthritis, no swelling, no gout. NEUROLOGIC: No numbness, no tingling, no ataxia. Positive seizure-type activity. PSYCH: No anxiety, no insomnia, no ADD.   PAST MEDICAL HISTORY: 1. Status post bilateral nephrectomies.  2. History of tubular sclerosis.  3. Depression. 4. Hypertension. 5. Seizure disorder. 6. Asthma. 7. Secondary hyperparathyroidism. 8. Obesity. 9. Obstructive sleep apnea.  10. Osteoarthritis. 11. Status post cholecystectomy in 1991.   ALLERGIES: Iodine causes a rash.   SOCIAL HISTORY: No smoking. No alcohol abuse. No illicit drug abuse. Currently resides at a group home.   FAMILY HISTORY: Mother is deceased, died from a car accident. Father has diabetes, is currently alive.   CURRENT MEDICATIONS:  1. Protonix 40 mg daily.  2. Topamax 200 mg b.i.d.  3. Primidone 250 mg b.i.d.  4. Klonopin 0.5 mg to be taken on dialysis days. 5. Colace 100 mg b.i.d.  6. Cymbalta 30 mg b.i.d.  7. Advair 250/50 1 puff b.i.d.  8. Midodrine 10 mg predialysis. 9. Flexeril 5 mg b.i.d. as needed.  10. Oxycodone 5 mg q.4 hours p.r.n.  11. Loratadine 10 mg daily.   PHYSICAL EXAMINATION ON ADMISSION:   VITAL SIGNS: Temperature 98.8, pulse 78, respirations 18, blood pressure 131/78, sats 97% on room air.   GENERAL: She is a pleasant appearing female in no  apparent distress.   HEENT: Atraumatic, normocephalic. Her extraocular muscles are intact. Her pupils are equal and reactive to light. Sclerae anicteric. No conjunctival injection. No oropharyngeal erythema.   NECK: Supple. No jugular venous distention, no bruits, no lymphadenopathy, no thyromegaly.   HEART: Regular rate and  rhythm. No murmurs, no rubs, no clicks.   LUNGS: Clear to auscultation bilaterally. No rales, no rhonchi, no wheezes.   ABDOMEN: Soft, flat, nontender, nondistended. Has good bowel sounds. No hepatosplenomegaly appreciated.   EXTREMITIES: No evidence of any cyanosis, clubbing, or peripheral edema. +2 pedal and radial pulses bilaterally. She does have a right upper extremity HERO catheter with a good bruit and a good thrill. No evidence of any drainage or bruising.   NEUROLOGIC: She is alert, awake, and oriented x3 with no focal motor or sensory deficits appreciated bilaterally.   SKIN: Moist and warm with no rashes.   LYMPHATIC: There is no cervical or axillary adenopathy.   LABORATORY, DIAGNOSTIC, AND RADIOLOGICAL DATA: Serum glucose 94, BUN 20, creatinine 3.7, sodium 141, potassium 3.5, chloride 97, bicarb 34. The patient's LFTs are within normal limits. Troponin 0.04. White cell count 3.7, hemoglobin 10.7, hematocrit 32.8, platelet count 126.   The patient did have a chest x-ray done which showed no evidence of any acute cardiopulmonary disease.   The patient also had a CT scan of the head done without contrast showing intracranial calcifications most likely chronic, soft tissue nodule over the right frontal scalp. No acute abnormality noted.   ASSESSMENT AND PLAN: This is a 59 year old female with history of end-stage renal disease on hemodialysis, hypertension, depression, history of seizure disorder, obesity, obstructive sleep apnea, osteoarthritis, status post bilateral nephrectomies, history of tubular sclerosis and angiolipomas who presents to the hospital with recurrent seizures.  1. Recurrent seizures. The exact etiology is currently unclear. The patient apparently has had three episodes in the past two days. She had two seizures today postdialysis witnessed by the dialysis nurse. Her initial CT is negative. She does not have any acute seizure-type activity presently. For now I will  place on seizure precautions. I will get a Neurology consult. I discussed this case with Dr. Loletta Specter who will see the patient I will check a primidone level. Her medications likely need to be adjusted. For now will continue her primidone and Topamax for now. Will likely need to get her office records from Dr. Doy Mince at [facility] who she has seen in the past.  2. End-stage renal disease on hemodialysis. The patient finished her dialysis today. Will resume schedule on Tuesday, Thursday, Saturday. Consult Nephrology.  3. Asthma. No evidence of any acute exacerbation. Continue Advair and p.r.n. nebs.  4. Gastroesophageal reflux disease. Continue Protonix.   5. Depression. Continue Cymbalta.  6. Obstructive sleep apnea. Continue with CPAP.   CODE STATUS: The patient is a FULL CODE.   TIME SPENT: 50 minutes.  ____________________________ Belia Heman. Verdell Carmine, MD vjs:drc D: 09/15/2011 16:36:00 ET T: 09/16/2011 07:55:39 ET JOB#: [id number]  cc: Belia Heman. Verdell Carmine, MD, <Dictator> Dr. Benay Pillow  Henreitta Leber MD ELECTRONICALLY SIGNED 09/17/2011 13:27

## 2014-11-07 NOTE — Consult Note (Signed)
Brief Consult Note: Diagnosis: septic shock with MSAA bacremia, newly diagnosed cardiomyopathy with severely reduced LVSF.   Patient was seen by consultant.   Consult note dictated.   Comments: the patient appears critically ill and lethargic.  Continue Levophid.  Cardiomyoptathy might be due to stunned myocardiam from sepsis. Avoid aggressive IV hydration. Consider CVP monitoring.  Overall, poor prognosis.  Electronic Signatures: Kathlyn Sacramento (MD)  (Signed 23-Apr-13 09:25)  Authored: Brief Consult Note   Last Updated: 23-Apr-13 09:25 by Kathlyn Sacramento (MD)

## 2014-11-07 NOTE — Consult Note (Signed)
Chief Complaint:   Subjective/Chief Complaint Patient still complaining of CP  COnstant  Worse with breathing and moving.   VITAL SIGNS/ANCILLARY NOTES: **Vital Signs.:   27-Apr-13 01:35   Vital Signs Type Q 4hr   Temperature Temperature (F) 97.7   Celsius 36.5   Temperature Source oral   Pulse Pulse 68   Pulse source per Dinamap   Respirations Respirations 21   Systolic BP Systolic BP 629   Diastolic BP (mmHg) Diastolic BP (mmHg) 69   Mean BP 86   BP Source Dinamap   Pulse Ox % Pulse Ox % 97   Pulse Ox Activity Level  At rest   Oxygen Delivery Room Air/ 21 %    05:46   Vital Signs Type Q 4hr   Temperature Temperature (F) 98   Celsius 36.6   Temperature Source oral   Pulse Pulse 85   Pulse source per Dinamap   Respirations Respirations 20   Systolic BP Systolic BP 528   Diastolic BP (mmHg) Diastolic BP (mmHg) 75   Mean BP 91   BP Source Dinamap   Pulse Ox % Pulse Ox % 94   Pulse Ox Activity Level  At rest   Oxygen Delivery Room Air/ 21 %    09:33   Vital Signs Type Q 4hr   Temperature Temperature (F) 97.6   Celsius 36.4   Temperature Source oral   Pulse Pulse 83   Pulse source per Dinamap   Respirations Respirations 20   Systolic BP Systolic BP 93   Diastolic BP (mmHg) Diastolic BP (mmHg) 56   Mean BP 68   BP Source Dinamap   Pulse Ox % Pulse Ox % 94   Pulse Ox Activity Level  At rest   Oxygen Delivery 2L    13:58   Vital Signs Type Routine   Temperature Temperature (F) 98.2   Celsius 36.7   Temperature Source oral   Pulse Pulse 68   Pulse source per Dinamap   Respirations Respirations 20   Systolic BP Systolic BP 90   Diastolic BP (mmHg) Diastolic BP (mmHg) 57   Mean BP 68   BP Source Dinamap   Pulse Ox % Pulse Ox % 97   Pulse Ox Activity Level  At rest   Oxygen Delivery 2L   Physical Exam:   GEN no acute distress    NECK Unable to assess JVP    RESP Takes small breaths.  Rel clear anteiorly .  Crackles at L base    CARD LE edema present   RRR.  S1, S2  No S3.  Distant.  No signif murmurs  Chest is tender to palpitations    ABD Obese.  Positive BS.  Diffusely tender  No reboound    EXTR R arm with wound   Assessment/Plan:  Assessment/Plan:   Assessment 1.  CHF  Severe LV dysfunction.  I would check labs in AM Follow BP 2.  CP  I agree with Dr Fletcher Anon  Not cardiac  Chest wall tender.  Plan for medical Rx  Patient will need f/u as outpatient to determine further treatment.   Electronic Signatures: Dorris Carnes (MD)  (Signed 27-Apr-13 16:38)  Authored: Chief Complaint, VITAL SIGNS/ANCILLARY NOTES, Physical Exam, Assessment/Plan   Last Updated: 27-Apr-13 16:38 by Dorris Carnes (MD)

## 2014-11-07 NOTE — Consult Note (Signed)
PATIENT NAME:  [name], [name] MR#:  [id number] DATE OF BIRTH:  1956/06/04  DATE OF CONSULTATION:  11/06/2011  REFERRING PHYSICIAN:  Theodoro Grist, MD    CONSULTING PHYSICIAN:  Muhammad A. Fletcher Anon, MD  PRIMARY CARE PHYSICIAN: Newman Pies, MD   REASON FOR CONSULTATION: Chest pain and newly diagnosed cardiomyopathy.   HISTORY OF PRESENT ILLNESS: This is a 59 year old female with a history of end-stage renal disease on hemodialysis due to nephrectomies for renal mass, asthma, chronic obstructive pulmonary disease, seizure disorder, schizophrenia, and possible mental retardation. She is also morbidly obese. She presented with sharp left-sided chest pain associated with a fever. Her temperature on presentation was 102 and she did complain of dyspnea. She is currently not able to provide any history. The patient was found to have MSSA bacteremia due to dialysis graft infection, which was removed yesterday. She is being treated with antibiotics but continues to be hypotensive and requiring 11 mcg of Levophed. She was found to have mildly elevated cardiac enzymes with a troponin of 0.3. She had an echocardiogram done which showed severely reduced LV systolic function with an ejection fraction less than 25%. It appears that she has no previous documented history of heart failure or cardiomyopathy. She is not able to provide me with any history or details at this time as she is lethargic.   PAST MEDICAL/SURGICAL HISTORY:  1. End-stage renal disease on hemodialysis.  2. Status post bilateral nephrectomy.  3. Tubular sclerosis.  4. Depression.  5. Hypertension.  6. Seizure disorder.  7. Asthma.  8. Morbid obesity.  9. Obstructive sleep apnea.  10. Osteoarthritis.  11. Status post cholecystectomy.  12. Schizophrenia.  13. 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  ALLERGIES: Medrol and iodine.   SOCIAL HISTORY: She lives in a group home. There is no history of tobacco, alcohol or drug use.   FAMILY HISTORY:  Family history is remarkable for diabetes.   HOME MEDICATIONS:  1. Advair twice daily.  2. Allegra 180 mg once daily.  3. Ambien 5 mg daily as needed.  4. Flonase.  5. Keppra.  6. Loperamide.  7. Loratadine.  8. Oxycodone.  9. Fosamax.  10. Primidone.  11. Renvela.  12. Sorbitol.  13. Topamax.   REVIEW OF SYSTEMS: Unable to obtain at this time due to the patient's mental status.   PHYSICAL EXAMINATION:  GENERAL: The patient seems to be lethargic and critically ill.   VITAL SIGNS: Temperature 97.6, pulse 70, respiratory rate 17, blood pressure 106/59, oxygen saturation is 92% on 3 liters nasal cannula.   HEENT: Normocephalic, atraumatic.   NECK: No jugular venous distention or carotid bruits.   RESPIRATORY: Normal respiratory effort with no use of accessory muscles. Auscultation reveals normal breath sounds.   CARDIOVASCULAR: Distant heart sounds. Normal S1 and S2 with no gallops or murmurs.   ABDOMEN: Benign, nontender, and nondistended.   EXTREMITIES: Trace edema bilaterally.   PSYCHIATRIC: The patient is lethargic and does not seem to be oriented at this time.   LABORATORY, DIAGNOSTIC AND RADIOLOGICAL DATA:  ECG showed normal sinus rhythm with first degree AV block. There is right bundle branch block with a left anterior fascicular block.  Her blood culture grew methicillin-sensitive staph aureus.  Her current labs show a creatinine of 6.24. Troponin currently is 0.14. Hemoglobin is 9.6.   IMPRESSION:  1. Septic shock, likely due to hemodialysis graft infection.  2. Newly diagnosed cardiomyopathy with severely reduced LV systolic function.  3. End-stage renal disease on hemodialysis.   RECOMMENDATIONS:  The patient appears critically ill at this time with evidence of septic shock, likely due to an infected dialysis graft which was removed yesterday. She is still requiring Levophed to maintain her blood pressure. She is also lethargic, and her blood gas showed a pH of  7.21, with a pCO2 of 76, and a pO2 of 66. The patient is currently being treated with antibiotics and supportive care overall. It is not clear if her cardiomyopathy is of new onset or whether she had this in the past. It could be due to myocardial stunning from sepsis. This obviously will make managing her sepsis difficult due to difficulty in managing her fluid status. I recommend avoiding aggressive hydration. Consider CVP monitoring if possible. If her condition worsens, we might have to consider short-term inotropic use. The patient might ultimately require ischemic cardiac evaluation in the future once her acute issues resolve. Once she is not hypotensive, she will need to be treated with Carvedilol as well as an ACE inhibitor. Obviously, this is not possible at this time due to her hypotension. Her overall prognosis seems to be poor. I will continue to follow the patient with you.  ____________________________ Mertie Clause. Fletcher Anon, MD maa:cbb D: 11/06/2011 09:34:52 ET T: 11/06/2011 10:33:29 ET JOB#: [id number]  cc: Muhammad A. Fletcher Anon, MD, <Dictator> Ophelia Charter, MD Rogue Jury Ferne Reus MD ELECTRONICALLY SIGNED 11/19/2011 12:32

## 2014-11-07 NOTE — Op Note (Signed)
PATIENT NAME:  [name], [name] MR#:  [id number] DATE OF BIRTH:  01-09-1956  DATE OF PROCEDURE:  11/12/2011  PREOPERATIVE DIAGNOSES:  1. End-stage renal disease, in need of permanent dialysis access.  2. Status post removal of infected HeRo graft.  3. Morbid obesity.   POSTOPERATIVE DIAGNOSES:  1. End-stage renal disease, in need of permanent dialysis access.  2. Status post removal of infected HeRo graft.  3. Morbid obesity.   PROCEDURE PERFORMED:   1. Removal of temporary dialysis catheter and replacement in the same venous access of 55 cm tip-to-cuff  left tunneled hemodialysis catheter.  2. Fluoroscopic guidance for placement of the catheter.   SURGEON: Algernon Huxley, MD   ANESTHESIA: Local with moderate conscious sedation.   ESTIMATED BLOOD LOSS: Approximately 25 mL.  FLUOROSCOPY TIME: Approximately 1 minute.   CONTRAST USED: None.   INDICATION FOR PROCEDURE: This is a morbidly obese white female who was admitted several days ago with an infected HeRo graft. This has been removed. She has had a temporary dialysis catheter in her groin for about a week. She has very difficult vascular access for permanent dialysis access; and we will plan on using her left arm likely for a HeRo graft on the left, and so a femoral PermCath is needed. The risks and benefits were discussed. Informed consent was obtained.   DESCRIPTION OF PROCEDURE: The patient was brought to the Vascular Interventional Radiology Suite, and the left groin and existing catheter were sterilely prepped and draped, and a sterile surgical field was created. An Amplatz Superstiff Wire was placed through the existing temporary dialysis catheter, and this dialysis catheter was removed. I then dilated up and placed a peel-away sheath. The counterincision made in the left anterior thigh, and we tunneled a 55 cm tip-to-cuff tunneled hemodialysis catheter using fluoroscopic guidance to select the length, placed this over the wire,  removed the peel-away sheath and then the wire. The catheter tip ended up in good location in the retrohepatic vena cava just shy of the right atrium without kink, and the appropriate distal connectors were placed. The catheter withdrew blood well and flushed easily with heparinized saline, and a concentrated heparin solution was placed. It was secured to the skin with 2 Prolene sutures. The access incision was closed with a 4-0 Monocryl, and a 4-0 Monocryl pursestring was placed around the exit site. A sterile dressing was placed. The patient tolerated the procedure well and was taken to the recovery room in stable condition.  ____________________________ Algernon Huxley, MD jsd:cbb D: 11/12/2011 16:09:21 ET T: 11/12/2011 17:36:14 ET JOB#: [id number]  cc: Algernon Huxley, MD, <Dictator> Algernon Huxley MD ELECTRONICALLY SIGNED 11/15/2011 13:56

## 2014-11-07 NOTE — Consult Note (Signed)
PATIENT NAME:  [name], [name] MR#:  [id number] DATE OF BIRTH:  07-25-1955  DATE OF CONSULTATION:  09/16/2011  REFERRING PHYSICIAN:  Abel Presto, MD  CONSULTING PHYSICIAN:  Rudell Cobb. Loletta Specter, MD  HISTORY: Ms. Grall is a 59 year old right-handed unmarried white resident of [facility] group home, patient of Dr. Benay Pillow and of Dr. Doy Mince of [facility] with history of seizure disorder beginning at age 54, hypertension, asthma, depression, obesity, obstructive sleep apnea, osteoarthritis, and hemodialysis status post bilateral nephrectomy surgeries (the left kidney in 01/08/1994 and for the right 01/14/2006) secondary to angiolipomas. She was admitted 09/15/2011 and is referred for evaluation of recurrent seizures.   The patient was brought to the Emergency Room at 12:45 p.m. on Saturday, 09/15/2011 from the dialysis center by EMTs summoned by the dialysis staff after reported witnessed seizure from the staff who stood the patient up after completion of her dialysis treatment. EMTs reported to the ER nurse that the patient was postictal on their arrival. She had been seen in the Emergency Room the prior evening after having had two seizures, one at 1 p.m. at her group home and another at 7:45 p.m. in the back of the [facility] transporting her to the Emergency Room. She has had no further seizures in the hospital.   The patient is a fair to poor historian regarding frequency of seizures, most reliably reporting seizure every few months in recent years including while being at the [facility] assisted living residence in [place] in 2010 until move 08/10/2011 to [facility] home in [place]. She reports not missing doses of her Topamax taking 200 mg twice a day and primidone taking 250 mg twice a day, her medications coming from [facility]. She has been on primidone for many years, perhaps since first seizures. She reports starting on Topamax approximately in 2000. She reports  prior trials of Dilantin and Neurontin were not effective. She does not recognize the names Tegretol or Depakote. She reports that she last saw Dr. Doy Mince about two years ago. She has been on dialysis for 5 or 6 years.   PHYSICAL EXAMINATION: The patient is a well developed overweight white woman (5 feet 7 inches and 288 pounds on arrival) who was examined lying semisupine, in no apparent distress. Blood pressure 130/75 and heart rate 76 with no fever. She was normocephalic without evidence of trauma and her neck was supple. She was alert, was oriented to situation, person, location, and time with exception of exact date. She had normal affect. Cranial nerve examination showed symmetric facial movement and symmetric appearance at rest, normal eye movements, visual fields full to finger count for each eye; visual acuity was not tested. Motor examination showed normal tone and strength throughout; extremity coordination was normal. Her gait was not tested.   IMPRESSION:  1. Chronic seizure disorder beginning at age 32.  2. Breakthrough of seizures with three seizures in a 24-hour period, in the setting of what appears to have not been full control of seizures in the past.   RECOMMENDATIONS:  1. Continue Topamax and primidone at her regular doses.  2. Add Keppra 1000 mg twice a day for two days, and then administer Keppra 1000 mg at night every day and 500 mg dose after each dialysis treatment.  3. Arrange follow-up with Dr. Doy Mince in 2 or 3 weeks following discharge.  4. I do not see indication for lumbar puncture.   I appreciate being asked to see this pleasant and  interesting lady.  ____________________________ Rudell Cobb. Loletta Specter, MD prc:drc D: 09/16/2011 20:37:48 ET T: 09/17/2011 08:58:58 ET JOB#: [id number]  cc: Rudell Cobb. Loletta Specter, MD, <Dictator> Linton Flemings MD ELECTRONICALLY SIGNED 09/18/2011 12:00

## 2014-11-07 NOTE — Op Note (Signed)
PATIENT NAME:  [name], [name] MR#:  [id number] DATE OF BIRTH:  1956/03/13  DATE OF PROCEDURE:  11/06/2011  PREOPERATIVE DIAGNOSES:  1. Sepsis.  2. Hypotension.  3. End-stage renal disease requiring hemodialysis.  4. Complication AV dialysis device.   POSTOPERATIVE DIAGNOSES:  1. Sepsis.  2. Hypotension.  3. End-stage renal disease requiring hemodialysis.  4. Complication AV dialysis device.   PROCEDURE PERFORMED: Insertion of right femoral triple-lumen central venous line.   SURGEON: Katha Cabal, M.D.   INDICATIONS: Ms. Dedeaux is a 59 year old woman who has decompensated on the floor and has been emergently transferred to the Intensive Care Unit. We are unable to get in touch with family and she is confused and lethargic. She is requiring multiple medications to maintain her life, which must be given parenterally and does not have adequate IV access. She is therefore undergoing emergent placement of a right femoral triple-lumen catheter. Risks and benefits were reviewed, all questions answered, and the patient has stated repeatedly that she does not wish to die and to do whatever is necessary. We are therefore proceeding.   DESCRIPTION OF PROCEDURE: The patient's right groin is shaved and then prepped and draped in sterile fashion. Ultrasound is placed in a sterile sleeve. Ultrasound is utilized secondary to lack of appropriate landmarks and to avoid vascular injury. Under direct ultrasound visualization, the femoral vein is identified. It is echolucent, homogeneous, and easily compressible. Image is recorded. With continuous ultrasound guidance, a Seldinger needle is inserted into the femoral vein after 1% lidocaine is infiltrated into soft tissues. J-wire is advanced, counter incision created, dilator passed over the wire, and the triple-lumen catheter fed     easily. The wire is removed and all three lumens flush and aspirate easily. The catheter is secured to the skin of the  thigh with 2-0 silk and a sterile dressing is applied. The patient tolerated the procedure well. There are no immediate complications.  ____________________________ Katha Cabal, MD ggs:slb D: 11/06/2011 13:18:51 ET T: 11/06/2011 14:32:28 ET JOB#: [id number]  cc: Katha Cabal, MD, <Dictator> Katha Cabal MD ELECTRONICALLY SIGNED 11/23/2011 7:52

## 2014-11-07 NOTE — Op Note (Signed)
PATIENT NAME:  [name], [name] MR#:  [id number] DATE OF BIRTH:  [birth year]  DATE OF PROCEDURE:  11/04/2011  PREOPERATIVE DIAGNOSIS: Infected right arm HeRO graft.   POSTOPERATIVE DIAGNOSIS: Infected right arm HeRO graft.   PROCEDURE PERFORMED: Excision right arm HeRO graft.   SURGEON: Katha Cabal, MD  ASSISTANT: Real Cons, PA-C.   ANESTHESIA: MAC.   ESTIMATED BLOOD LOSS: 75 mL.   SPECIMEN: Segments of the HeRO graft removed and photographed for the record.   INDICATIONS: [name] is a 59 year old woman who presented to the hospital with increasing signs and symptoms consistent with sepsis. She has a HeRO graft which has a history of having pus drain from it recently. She has destabilized and is now undergoing emergent excision of her HeRO graft to prevent worsening sepsis and death. Risks and benefits were reviewed. The case is deemed an emergency. The patient is informed but she is somewhat confused and we are unable to reach next of kin.   DESCRIPTION OF PROCEDURE: Patient is brought to the Operating Room, placed in the supine position with her right arm extended palm upward. Right arm is prepped and draped in sterile fashion all the way to the shoulder and base of the neck. The patient is initiated on MAC anesthesia. Central line has been placed in the right common femoral vein.   Incision is then created after 0.25% Marcaine is infiltrated extensively around the arterial anastomosis and the previous scar is re-incised. Dissection is carried down to expose the graft. The graft is then dissected down to the Prolene suture line. It is dissected more proximally along the graft to free up several centimeters of total length. It is then ligated as proximally as possible. It is also ligated as close to the suture line as possible leaving a 1 to 2 mm rim of prosthetic. The graft at this level is very well incorporated and appears uninflamed and uninfected. Segment of graft is then  resected and the proximal is tucked as far into the tissues as possible and then this area is oversewn extensively with 3-0 Vicryl, closed with staples.   0.25% Marcaine is infiltrated overlying the metal connector between the intravascular portion and the PTFE portion. This is at the shoulder level. The dissection is carried down to expose the grommet. The grommet is then dissected circumferentially. The intravascular section is removed quite easily. There does appear to be a film upon the graft which could be consistent with purulent infected biofilm. Pressure is held at the base of the neck. The hub is then dissected circumferentially as well. The PTFE portion is dissected as far distally as possible, ligated with an 0 Ethibond and then pushed down into the tissues and this area is irrigated and oversewn and the skin is closed with staples.   Two more incisions, one proximally and one distally, are made and further segments of the graft are resected. Linear incision is then made overlying the portion of the graft that is typically used for access where the purulent drainage was noted and the remaining segments of graft are resected. The two incisions were closed with 3-0 Vicryl followed by staples. The linear incision overlying the graft was then closed with a single midportion subcutaneous stitch and packed with saline moistened 2 x 2s. Sterile dressing was applied. The patient remained hemodynamically stable throughout the procedure and is taken to the Intensive Care Unit in guarded condition.    ____________________________ Katha Cabal, MD ggs:cms D: 11/06/2011 10:33:00  ET T: 11/06/2011 11:37:32 ET JOB#: [id number]  cc: Katha Cabal, MD, <Dictator> Munsoor Lilian Kapur, MD Katha Cabal MD ELECTRONICALLY SIGNED 11/23/2011 7:52

## 2014-11-07 NOTE — Op Note (Signed)
PATIENT NAME:  [name], [name] MR#:  [id number] DATE OF BIRTH:  10-12-1955  DATE OF PROCEDURE:  12/14/2011  PREOPERATIVE DIAGNOSES:  1. Superior vena cava syndrome.  2. Complication AV dialysis device with thrombosis of her right arm access.  3. End-stage renal disease requiring hemodialysis.   POSTOPERATIVE DIAGNOSES: 1. Superior vena cava syndrome.  2. Complication AV dialysis device with thrombosis of her right arm access.  3. End-stage renal disease requiring hemodialysis.   PROCEDURES PERFORMED: 1. Superior venacavogram.  2. Left upper extremity venography.   PROCEDURE PERFORMED BY: Katha Cabal, MD   SEDATION: Versed 2 mg plus fentanyl 50 mcg administered IV. Continuous ECG, pulse oximetry, and cardiopulmonary monitoring was performed throughout the entire procedure by the interventional radiology nurse. Total sedation time was 45 minutes.   ACCESS: 5 French sheath left brachial vein.   CONTRAST USED: Isovue 40 mL.   FLUORO TIME: Approximately two minutes.   INDICATIONS: [name] is a 59 year old woman on hemodialysis with increasingly difficult access. She has had multiple grafts as well as fistulas in both arms and is now undergoing evaluation for possible placement of a graft or HERO catheter. Risks and benefits were reviewed for venography. All questions answered. The patient agrees to proceed.   PROCEDURE: The patient is taken to Special Procedures and placed in the supine position. After adequate sedation is achieved, left arm is extended and prepped and draped in a sterile fashion. Ultrasound is placed in a sterile sleeve. Ultrasound is utilized secondary to lack of appropriate landmarks and to avoid vascular injury. Under direct ultrasound visualization, brachial vein is identified. It is echolucent, homogeneous, and easily compressible indicating patency. Image is recorded for the permanent record. Under direct ultrasound visualization after 1% lidocaine has  been infiltrated in the soft tissues, micropuncture needle is inserted into the brachial vein, microwire followed by micro sheath. Hand injection of contrast is utilized to demonstrate the venous pattern of the left upper extremity. However, central images are inadequate and, therefore, floppy Glidewire and a KMP catheter are used in an attempt to cross the venous occlusion in the arm. This was not successful but the catheter and the wire did cross and, therefore, the 5 French sheath is inserted. Ultimately a combination of the floppy glide and a CXI catheter were able to cross the lesion in the arm and the CXI positioned in the mid subclavian. Hand injection of contrast then demonstrated the venous anatomy. This represents introduction of the catheter into the central venous system.   After review of the images, catheter and sheath are pulled, pressure is held, and there are no immediate complications.   INTERPRETATION: There is an extensive collateral network within the right arm. There are several occlusions within the brachial system. The axillary vein appears to be patent in its proximal portion and there is a very large thoracic collateral that extends down the side of the rib cage. The subclavian vein is patent. Innominate vein is initially visualized but then occludes. However, there are several large collaterals which then ultimately fill the superior vena cava. This is a long segment occlusion of the innominate with multiple collateral veins. It does appear that the hemiazygous vein is included in this process.   Extensive venous collaterals although the axillary vein does appear to be patent and there are several large collaterals, I do not believe a HERO graft would be feasible as crossing this venous lesion will be quite improbable. However, given the number of collaterals it is  possible that a standard break access graft may stay open. This might result in significant arm swelling. This would be  unpredictable.   ____________________________ Katha Cabal, MD ggs:drc D: 12/14/2011 09:00:45 ET T: 12/14/2011 09:20:52 ET JOB#: [id number]  cc: Katha Cabal, MD, <Dictator> Katha Cabal MD ELECTRONICALLY SIGNED 12/25/2011 8:57

## 2014-11-07 NOTE — Consult Note (Signed)
Brief Consult Note: Diagnosis: right arm HERO graft infection.   Patient was seen by consultant.   Recommend further assessment or treatment.   Comments: Difficult problem apparently the HERO graft is 57 years old (and therefore will be very well incorporated).  earlier today she was afebrile and she has a normal WBC.  However I saw her while she was on dialysis and she had a temp of 101.  Examination of the graft did not show any area of overt infection and there was no erythemia or induration of the surrounding skin.  Some section over the graft were obscured by the cannulation needles.  Cultures are + for gm+ clusters no species yet.  She is not toxic at this time.  I would like to see what the species of staph is and if aureus then will plan for graft removal.  If epi then I wonder can a HERO be treated like a catheter?.  Electronic Signatures: Hortencia Pilar (MD)  (Signed 20-Apr-13 22:53)  Authored: Brief Consult Note   Last Updated: 20-Apr-13 22:53 by Hortencia Pilar (MD)

## 2014-11-07 NOTE — Consult Note (Signed)
PATIENT NAME:  [name], [name] MR#:  [id number] DATE OF BIRTH:  [birth year]  DATE OF CONSULTATION:  11/05/2011  REFERRING PHYSICIAN:  Katharina Caper, MD   CONSULTING PHYSICIAN:  Rosalyn Gess. Chestine Belknap, MD  REASON FOR CONSULTATION: Staph sepsis.   HISTORY OF PRESENT ILLNESS: The patient is a 59 year old white female with a past history significant for status post bilateral nephrectomy-is on dialysis, seizures, asthma, and chronic obstructive pulmonary disease, who was admitted on April 19th with chest pains. She did not have any EKG changes but did have fever of up to 102 on admission. The patient is a poor historian, and the history is obtained mainly from the chart. She states that she was having some nausea and vomiting before but could not be very specific about how often. Per the History and Physical, she was complaining of some blurred vision and some dizziness as well as chest pains. Her admission blood cultures are growing methicillin-sensitive Staphylococcus aureus. She was given a dose of ceftriaxone and vancomycin in the Emergency Room. She was started on Zosyn and currently is on daptomycin, Zosyn and levofloxacin.   PAST MEDICAL/SURGICAL HISTORY:  1. Status post bilateral nephrectomy for a renal mass. She is currently on hemodialysis.  2. Tubular sclerosis.  3. Depression.  4. Hypertension.  5. Seizure disorder.  6. Asthma.  7. Obesity.  8. Sleep apnea.  9. Osteoarthritis.  10. Chronic obstructive pulmonary disease.   SOCIAL HISTORY: She lives in a group home. She does not smoke. She does not drink. No history of injecting drug use.   FAMILY HISTORY: Positive for diabetes.   REVIEW OF SYSTEMS: She is a very poor historian. She states that she had been having some nausea and vomiting but was unable to provide a reliable history. She did not appear particularly confused, however.   PHYSICAL EXAMINATION:  VITAL SIGNS: T-max of 100.8 since being admitted to the Critical Care Unit.  She had a temperature of over 102 in the Emergency Room. T-current is 97.9, pulse 66, blood pressure 93/53, 98% on CPAP.   GENERAL: Obese 59 year old white female in no acute respiratory distress.   HEENT: Normocephalic, atraumatic. Pupils are equal and reactive to light. Extraocular motion intact. Sclerae, conjunctivae, and lids are without evidence for emboli or petechiae. Oropharynx shows no erythema or exudate. Teeth and gums are in fair condition.   NECK: Midline trachea. No lymphadenopathy. No thyromegaly.   LUNGS: Clear to auscultation bilaterally with good air movement. No focal consolidation.   HEART: Regular rate and rhythm without murmur, rub, or gallop.   ABDOMEN: Soft, nontender, and nondistended. No hepatosplenomegaly. No hernia is noted.   EXTREMITIES: There is a bandage in the right chest that has bright red blood on it. There is no evidence for tenosynovitis.   SKIN: No rashes other than some papular rash over her face and nose that appeared chronic. There are no stigmata of endocarditis, specifically no Janeway lesions nor Osler nodes.   NEUROLOGICAL: The patient was awake and alert but a very poor historian. She was moving all four extremities.   PSYCHIATRIC: Mood and affect appeared somewhat flattened.   LABORATORY, DIAGNOSTIC AND RADIOLOGICAL DATA:  BUN 47, creatinine 6.24, bicarbonate 29, anion gap 10.  White count of 6.5, hemoglobin 9.6, platelet count 65, ANC of 5.5. White count on admission was 5.5.  Blood cultures from admission are growing methicillin-sensitive Staphylococcus aureus in four out of four bottles. Repeat blood cultures from today are pending.  Hepatitis B surface antigen was  negative.  Chest x-ray from admission showed interstitial infiltrate likely related to pulmonary edema. Chest x-ray from yesterday showed similar findings.   IMPRESSION: A 59 year old white female with a history of status post bilateral nephrectomy-on hemodialysis, seizures,  asthma, and chronic obstructive pulmonary disease, admitted with methicillin-sensitive Staphylococcus aureus hemodialysis catheter infection.   RECOMMENDATIONS:  1. Her blood cultures from admission are growing methicillin-sensitive Staphylococcus aureus. She states that she had methicillin-resistant Staphylococcus aureus in the past but cannot provide a good history of when this was. When pressed, she states that she was hospitalized a year ago in [place] with MRSA. Her catheter was not removed at that time, however. Her HeRo catheter has been removed yesterday.  2. Repeat blood cultures from today are pending.  3. We will change her antibiotics to cefazolin. Pharmacy will dose this for her renal function.  4. If her blood cultures are negative, we can place a new PermCath.  5. We will discontinue contact isolation.   This is a high-level Infectious Disease consult.   Thank you very much for involving me in [name]'s care.  ____________________________ Rosalyn Gess. Comer Devins, MD meb:cbb D: 11/05/2011 09:30:20 ET T: 11/05/2011 09:47:33 ET JOB#: [id number]  cc: Rosalyn Gess. Jonee Lamore, MD, <Dictator> Mariany Mackintosh E Patrece Tallie MD ELECTRONICALLY SIGNED 11/05/2011 11:05

## 2014-11-07 NOTE — Consult Note (Signed)
VITAL SIGNS/ANCILLARY NOTES: **Vital Signs.:   28-Apr-13 00:06   Vital Signs Type Q 4hr   Temperature Temperature (F) 97.8   Celsius 36.5   Temperature Source oral   Pulse Pulse 67   Pulse source per Dinamap   Respirations Respirations 20   Systolic BP Systolic BP 081   Diastolic BP (mmHg) Diastolic BP (mmHg) 63   Mean BP 76   BP Source Dinamap   Pulse Ox % Pulse Ox % 90   Pulse Ox Activity Level  At rest   Oxygen Delivery Room Air/ 21 %    00:40   Pulse Ox % Pulse Ox % 93   Pulse Ox Activity Level  At rest   Oxygen Delivery 2L    05:35   Vital Signs Type Routine   Temperature Temperature (F) 97.3   Celsius 36.2   Temperature Source oral   Pulse Pulse 98   Pulse source per Dinamap   Respirations Respirations 18   Systolic BP Systolic BP 448   Diastolic BP (mmHg) Diastolic BP (mmHg) 88   Mean BP 97   BP Source Dinamap   Pulse Ox % Pulse Ox % 99   Pulse Ox Activity Level  At rest   Oxygen Delivery 2L; Nasal Cannula    09:20   Vital Signs Type Q 4hr   Temperature Temperature (F) 97.3   Celsius 36.2   Temperature Source oral   Pulse Pulse 58   Pulse source per Dinamap   Respirations Respirations 18   Systolic BP Systolic BP 98   Diastolic BP (mmHg) Diastolic BP (mmHg) 67   Mean BP 77   BP Source Dinamap   Pulse Ox % Pulse Ox % 95   Pulse Ox Activity Level  At rest   Oxygen Delivery 2L   Routine Hem:  28-Apr-13 05:22    Hemoglobin (CBC) 9.3  Routine Chem:  28-Apr-13 05:22    Glucose, Serum 104   BUN 37   Creatinine (comp) 4.99   Sodium, Serum 133   Potassium, Serum 4.6   Chloride, Serum 95   CO2, Serum 31   Calcium (Total), Serum 8.4   Anion Gap 7   Osmolality (calc) 275   eGFR (African American) 11   eGFR (Non-African American) 9   B-Type Natriuretic Peptide [facility]) [id number]   Electronic Signatures: Dorris Carnes (MD)  (Signed 28-Apr-13 12:56)  Authored: VITAL SIGNS/ANCILLARY NOTES, Lab Results   Last Updated: 28-Apr-13 12:56 by Dorris Carnes (MD)

## 2014-11-07 NOTE — Discharge Summary (Signed)
PATIENT NAME:  [name], [name] MR#:  [id number] DATE OF BIRTH:  06/14/1956  DATE OF ADMISSION:  09/15/2011 DATE OF DISCHARGE:  09/17/2011  ADMITTING DIAGNOSIS: Recurrent seizures.   DISCHARGE DIAGNOSES:  1. Recurrent seizures with history of seizure disorder in the past possibly related to recent stressors. Patient now on treatment, now Keppra is added to her current regimen.  2. End-stage renal disease.  3. History of status post bilateral nephrectomies.  4. History of tuberculosis.  5. Depression.  6. Hypertension.  7. Seizure disorder.  8. Asthma.  9. Secondary hyperparathyroidism.  10. Morbid obesity.  11. Obstructive sleep apnea.  12. Osteoarthritis.  13. Status post cholecystectomy.   CONSULTANT: Dr. Kemper Durie.   LABORATORY, DIAGNOSTIC AND RADIOLOGICAL DATA: BMP: Glucose 94, BUN 20, creatinine 3.7, sodium 141, potassium 3.5, chloride 97, bicarbonate 34. LFTs were normal. Troponin 0.04, WBC 3.7, hemoglobin 10.7, platelet count 126. Chest x-ray was negative. CT scan of the head without contrast showed intracranial calcifications. Soft tissue nodule in the right frontal scalp. No acute abnormality noted. Phenobarbital level 3.8, which patient is not on phenobarbital. Primidone serum levels 4.4   HOSPITAL COURSE: Please see history and physical done by the admitting physician. Patient is a 59 year old white female who is a very poor historian reported that she has not had a seizure in a very long time. Recently moved from [place] due to the facility that she was staying at closed down and had to be moved down here to another assisted living facility. Patient reports that she is under a lot of stress because of the move. She was brought to the ED with seizure to the ED day prior to being admitted was worked up and discharged and then had two further seizures at dialysis therefore she was sent back. She was admitted for further evaluation. Patient as stated was a very poor historian and is  not able to give [place] too much details about her previous seizure activity but reported that she has not had a seizure in a long time. Patient was on primidone and Topamax for hemodialysis. The dose that she was on was maxed out therefore neurology consult was obtained. She had no further seizures while in the hospital. Dr. Kemper Durie saw her and recommended starting on Keppra which patient has been started. At this time she is stable. She does have a neurologist in [place] that she needs to follow up with. According to Dr. Kemper Durie at this time she is stable for discharge.   DISCHARGE MEDICATIONS:  1. Oxycodone 5 mg q.4 p.r.n.  2. Loratadine 10, 1 tab p.o. daily.  3. Flexeril 5 p.o. b.i.d.  4. Allegra 180 mg daily as needed. 5. Flonase 50 mcg one spray nasally once daily.  6. Midodrine 10 mg daily.  7. Sorbitol 70% 30 mL orally once a day as needed.  8. Triple antibiotic ointment one application topically 4 times a day. 9. Sarna lotion topically b.i.d.  10. Loperamide 2 mg 1 tab p.o. every four hours as needed. 11. Ethyl chloride topical spray as needed. 12. Biotene mouth wash 3 times a day. 13. EMLA 2.5%/2.5 topical cream daily.  14. Bactroban 2% one application nasally b.i.d.  15. Delsym children's nighttime cough and cold 3 mg orally every six hours as needed.  16. Protonix 40 daily.  17. Ambien 5 at bedtime.  18. Tylenol Extra Strength 500 mg 2 tabs every six hours as needed.  19. MiraLax 17 grams daily.  20. Rena-Vite 1 tab p.o. daily.  21. Clonazepam 0.5, 1 tab p.o. daily.  22. Colace 100, 1 tab p.o. b.i.d.  23. Cymbalta 20 b.i.d.  24. Primidone 250, 1 tab p.o. b.i.d.  25. Topamax 200, 1 tab p.o. b.i.d.  26. PhosLo 2 caps t.i.d. with each meal. 27. Renvela 2400 mg p.o. t.i.d. with each meal. 28. Keppra 100 mg p.o. daily as well as 500 mg post dialysis on Monday, Wednesday, Friday.   DISCHARGE FOLLOW UP: Follow up 1 to 2 weeks with Dr. Darryll Capers her primary M.D., primary  neurologist in [place] in 4 to 6 weeks.   ACTIVITY: As tolerated.   DIET: Renal diet.   TIME SPENT: 35 minutes.   ____________________________ Lacie Scotts Allena Katz, MD shp:cms D: 09/17/2011 09:21:34 ET T: 09/17/2011 11:42:28 ET JOB#: [id number]  cc: Navy Rothschild H. Allena Katz, MD, <Dictator> Dr. Darryll Capers  Charise Carwin MD ELECTRONICALLY SIGNED 09/21/2011 7:53

## 2014-11-07 NOTE — H&P (Signed)
PATIENT NAME:  [name], [name] MR#:  [id number] DATE OF BIRTH:  [birth year]  DATE OF ADMISSION:  11/02/2011  REFERRING PHYSICIAN: Dr. Benjaman Lobe.   PRIMARY CARE PHYSICIAN: Dr. Arnoldo Morale.    CHIEF COMPLAINT: Chest pain.   HISTORY OF PRESENT ILLNESS: The patient is a 59 year old morbidly obese Caucasian female who is here complaining of chest pain. The patient has a history of dialysis-dependent renal disease in the setting of nephrectomies for renal mass, asthma, chronic obstructive pulmonary disease, seizure disorder who was here in early March for seizures. She lives in a group home. She is here because of chest pain which is sharp, started 9:30 p.m. Pain is coming and going,  however, did not get improved with Advair. She had no fevers last night but she had had a fever here of 102. The patient has some shortness of breath. She complains of some blurry vision and some dizziness with chest pain as well. The patient also has the chest pain with respirations. She was found to have a troponin of 0.31, and the EKG looks similar to previous EKGs without any acute ST changes. Hospitalist services was contacted for further evaluation and management. X-ray of the chest had shown a right pleural effusion and possible pneumonia. She has received Rocephin, Levaquin, and vancomycin by the ER physician, and blood cultures have been sent. She has a chronic diarrhea. She has had no further seizures since the last admission.   PAST MEDICAL HISTORY:  1. Status post bilateral nephrectomy.   2. End-stage renal disease, on dialysis Tuesdays, Thursdays, and Saturdays, last dialyzed yesterday.  3. History of tubular sclerosis, depression, hypertension, seizure disorder, asthma, secondary hyperparathyroidism, obesity, obstructive sleep apnea, osteoarthritis, status post cholecystectomy in 1991.   ALLERGIES: Medrol and iodine.   SOCIAL HISTORY: Lives in a group home. No tobacco, alcohol or drug use.   FAMILY HISTORY:  Father with diabetes. Mother is deceased in the setting of car accident.   CURRENT MEDICATIONS:  1. Advair 250/50 one puff inhaled 2 times a day.  2. Allegra 180 mg daily.  3. Ambien 5 mg at bedtime p.r.n.  4. Delsym Cough and Cold 10 mL every 6 hours as needed.   5. Flonase 50 mcg 2 sprays nasally daily.  6. Keppra 1000 mg once a day and 1.5 mg on Monday, Wednesday, Friday post dialysis per records here. However, she has dialysis on Tuesday, Thursdays, Saturdays which is confusing.  7. Loperamide 2 mg every 4 hours as needed.  8. Loratadine 10 mg daily.  9. Oxycodone 5 mg every 4 hours as needed.   10. PhosLo 2 capsules 3 times a day with meals.  11. Primidone 250 mg 1 tab 2 times a day.  12. Renvela 2400 mg 3 times a day with meal and 800 milligrams with snack.   13. Sorbitol 70% oral solution as needed for constipation.   14. Topamax 200 mg 2 times a day.   REVIEW OF SYSTEMS: CONSTITUTIONAL:  No fever prior to yesterday reported here. T-max was 102.5. Last temperature 101.6. No weakness and no weight changes.  EYES: Some blurry vision with the chest pain. ENT: Obstructive sleep apnea. No tinnitus.  No postnasal drip. RESPIRATORY: Occasional cough. No wheezing. Has chronic dyspnea on exertion and shortness of breath. Has asthma. Some painful respirations. CARDIOVASCULAR: Chest pain as above. No orthopnea or edema. Chronic dyspnea on exertion. No palpitations. No syncope.  GI: Chronic abdominal pain, chronic diarrhea for multiple months. She has a hiatal hernia. No dark  stools. GENITOURINARY: Denies dysuria. ENDOCRINE: No polyuria, nocturia, or thyroid problems. HEME/LYMPH: Anemia of chronic disease. No swollen glands. SKIN: No new rashes. MUSCULOSKELETAL: Chronic right clavicular pain and arthritis. NEUROLOGIC: Denies numbness, generalized weakness, seizure disorder. PSYCHIATRIC: Depression.   PHYSICAL EXAMINATION:  VITAL SIGNS: T-max 102.5, currently 101.6, pulse rate 85, respiratory rate 20,  blood pressure on arrival 112/49, currently 93/47, O2 saturation 93% on oxygen.  It was 87% without oxygen when I was examining her.    GENERAL:  Patient is a morbidly obese, Caucasian female lying in bed, no obvious distress, talking in full sentences.   HEENT: Normocephalic, atraumatic. Pupils are equal and reactive. Anicteric sclerae. Moist mucous membranes.   NECK: Supple. No thyroid tenderness. No lymphadenopathy.   CARDIOVASCULAR: S1, S2 regular rate and rhythm. No murmurs appreciated.   LUNGS: Decreased breath sounds at the right base and mild crackles, otherwise good air entry. The patient has a right upper extremity HeRO graft with good pulsation. No tenderness to palpation. No fluctuance appearance. No drainage.   ABDOMEN: Large hiatal hernia. Positive bowel sounds in all quadrants. No rebound or guarding. No significant tenderness.   EXTREMITIES: No significant lower extremity edema.   NEUROLOGICAL: Cranial nerves II through XII appear to be grossly intact. Strength 5 out of 5 all extremities.   LABORATORIES: BUN is 42, creatinine 6.14, sodium 133, chloride 94, glucose 94. Troponin 0.31. CK-MB less than 0.5, CK total 198. WBC 5.5, hemoglobin 9.8, it was 10.4 on 09/17/2011, hematocrit 30.5, platelets are 65,000. The patient appears to have chronic thrombocytopenia. INR is 1.3.   EKG: Sinus rhythm with 1st-degree AV block, left axis deviation, left ventricular hypertrophy.  There are Q waves II, III, and aVF as well as V3, V4, V5, and V6, otherwise looks pretty much similar to previous EKG from March.   X-ray of the chest: No official read but per me and ER physician there appears to be some PVC as well as right pleural effusion, possible pneumonia.   ASSESSMENT AND PLAN: We have a 59 year old morbidly obese nursing home resident with asthma, end-stage renal disease on dialysis, seizure disorder who was hospitalized here in March, depression, obstructive sleep apnea on CPAP who  presents with chest pain, fever, hypoxia and positive troponin. At this point would admit the patient to the hospital and evaluate the chest pain. The patient does have a troponin of 0.3 although she has no chest pain now. EKG looks pretty much similar to previous EKGs. We would cycle the troponins and get an echocardiogram. If she does rule out for acute coronary syndrome we would order a stress test which could be performed tomorrow. She been given aspirin which we would continue. I would check a lipid profile as well as an echocardiogram. The patient has some typical features and also some atypical features such as the effusion, fever, and pleuritic chest pain. The patient possibly also has a pneumonia. She has a fever and some hypoxia. She would be started on vancomycin, Levaquin, and Zosyn given the recent hospitalization and the patient being a dialysis patient at high risk of having more resistant gram-negative organisms such as Pseudomonas after Pharmacy's involvement with the dosing of the medications. We would also follow up with the blood cultures which were sent in the ED and get a sputum culture. Would continue the seizure medicines and consult with Nephrology for resumption of dialysis. Would continue the CPAP as well as Advair and start the patient on p.r.n. nebulizers.   CODE STATUS:  THE PATIENT IS FULL CODE.   TOTAL TIME SPENT: 55 minutes.    ____________________________ Vivien Presto, MD sa:vtd D: 11/02/2011 19:15:08 ET T: 11/03/2011 06:43:59 ET JOB#: [id number]  cc: Vivien Presto, MD, <Dictator> Ophelia Charter, MD Vivien Presto MD ELECTRONICALLY SIGNED 11/28/2011 17:55

## 2014-11-07 NOTE — Discharge Summary (Signed)
PATIENT NAME:  [name], [name] MR#:  [id number] DATE OF BIRTH:  Jun 01, 1956  DATE OF ADMISSION:  11/02/2011 DATE OF DISCHARGE:  11/17/2011   ADDENDUM:  This is the addendum to the discharge summary dictated by Dr. Ether Griffins on April 30th. I saw the patient only on May 4th. The patient is clinically baseline at present. She has multiple medical problems including her MSSA bacteremia which is improved. She completed a 14 day course of IV cefazolin. She will be discharged to [facility] after dialysis today. She will complete a course of Flagyl. Her last dose will be 11/26/2011. Hospital stay was prolonged, however, she remained stable and is going to be discharged to [facility] today.   CODE STATUS: She is a NO CODE/DO NOT RESUSCITATE.   DISCHARGE MEDICATIONS:  1. Loratadine 10 mg p.o. daily.  2. Allegra 180 mg p.o. daily as needed.  3. Sarna Lotion topically twice a day.  4. Primidone 250 mg b.i.d.  5. Topamax 200 mg b.i.d.  6. PhosLo 2 capsules 3 times a day with each meal.  7. Delsym Cough and Cold 10 mL every six hours as needed.  8. Flonase 50 mcg/inhalation two sprays daily.  9. Oxycodone 5 mg every four hours as needed.  10. Renvela 2400 mg orally 3 times a day as needed with each meal.  11. Keppra 1000 mg one tablet once a da and 1-1/2 tablet on Monday, Wednesday, Friday post dialysis.  12. Sorbitol 70% oral liquid 15 mL once a day as needed for constipation.  13. Renvela 800 mg once a day with snack and 2400 mg 3 times daily with meal.  14. Advair 250/50 one puff b.i.d.  15. Flagyl 500 p.o. q.8 hours for 10 days. Last dose will be 11/26/2011.  16. Cymbalta 60 mg p.o. daily.  17. Lisinopril 5 mg daily.  18. Coreg 3.125 b.i.d.  19. Aspirin 81 mg daily.   DIET: Low sodium, ADA, renal diet.   OXYGEN: 2 liters nasal cannula.   ACTIVITY: Physical therapy.   DISCHARGE INSTRUCTIONS: Continue hemodialysis Monday, Wednesday, Friday at the dialysis center.   LABS ON  DISCHARGE: Glucose 84, BUN 32, creatinine 5.41, sodium 133, potassium 4.8, chloride 94, bicarb 35, calcium 8.7, hemoglobin and hematocrit 9.2 and 29.2, white count 4.3, platelet count 149. C. difficile was positive on 11/13/2011. Stool comprehensive no Salmonella, Shigella, or Campylobacter.   For remaining summary, see Dr. Keenan Bachelor interim discharge summary dictated on 11/13/2011.   ____________________________ Hart Rochester. Posey Pronto, MD sap:drc D: 11/17/2011 10:46:39 ET T: 11/17/2011 11:47:30 ET JOB#: [id number]  cc: Medford Staheli A. Posey Pronto, MD, <Dictator> Ilda Basset MD ELECTRONICALLY SIGNED 11/25/2011 13:55

## 2014-11-07 NOTE — Op Note (Signed)
PATIENT NAME:  [name], [name] MR#:  [id number] DATE OF BIRTH:  1955/10/29  DATE OF PROCEDURE:  01/08/2012  PREOPERATIVE DIAGNOSES:  1. Complication AV dialysis device with exposure of the cuff, left thigh tunneled catheter.  2. Superior vena cava syndrome.  3. Infected right arm HeRO graft.  4. End-stage renal disease requiring hemodialysis.   POSTOPERATIVE DIAGNOSES:  1. Complication AV dialysis device with exposure of the cuff, left thigh tunneled catheter.  2. Superior vena cava syndrome.  3. Infected right arm HeRO graft.  4. End-stage renal disease requiring hemodialysis.   PROCEDURES PERFORMED:  1. Replacement of left thigh cuffed tunneled dialysis catheter, same venous access.  2. Introduction catheter into superior vena cava.  3. Right arm venogram.   SURGEON: Hortencia Pilar, MD  SEDATION: Versed 3 mg plus fentanyl 100 mcg administered IV. Continuous ECG, pulse oximetry and cardiopulmonary monitoring was performed throughout the entire procedure by the interventional radiology nurse. Total sedation time was 30 minutes.   ACCESSES:  1. Existing left thigh catheter.  2. Superficial vein, right forearm.   CONTRAST USED: Isovue 15 mL.   FLUORO TIME: 3.2 minutes.   INDICATIONS: [name] is a 59 year old woman with multiple medical problems who presented referred by the dialysis center secondary to her cuff being exposed on the left thigh catheter. She has also been scheduled for right arm venography to see if right-sided access would be feasible. The risks and benefits for undergoing the procedures were reviewed, all questions are answered, and the patient agrees to proceed.   DESCRIPTION OF PROCEDURE: The patient was taken to special procedures and placed in the supine position. After adequate sedation is achieved, the right thigh and catheter are prepped and draped in a sterile fashion. The catheter is then pulled back by approximately 10 cm, transected with scissors,  and an Amplatz superstiff wire is advanced through the catheter. Peel-away sheath is then advanced over the wire. A 50 cm tip-to-cuff palindrome catheter is then advanced over the wire through the peel-away sheath. The peel-away sheath is removed. The catheter is then seated so that the cuff is approximately 2 cm from the skin incision. It aspirates easily and flushes well. It is then packed with 5000 units of heparin per lumen. It is secured to the skin of the thigh with an 0 silk and a sterile dressing is applied.   Ultrasound is then placed in a sterile sleeve. Ultrasound is utilized secondary to lack of appropriate landmarks and to avoid vascular injury. Under direct ultrasound visualization, the superficial vein is identified in the forearm. It is echolucent, homogeneous, and compressible indicating patency. Image is recorded for the permanent record and with real-time visualization a micropuncture needle is inserted into the vein, microwire followed by microsheath. Hand injection of contrast through the microsheath then demonstrates the venous anatomy of the left arm. Visualization of the central vein is inadequate and a floppy Glidewire and subsequently a KMP catheter is advanced over the wire into the central venous system. Hand injection of contrast is utilized to demonstrate the inferior vena cava. Attempts are made at crossing into the atrium with the Glidewire, however, these are not successful.   INTERPRETATION: There is occlusion of the proximal subclavian, innominate, and likely the distal superior vena cava. These lesions could not be crossed. When compared to the left-sided venogram, there are far more collaterals on the left side compared to the right suggesting access in the left arm would have a better chance of functioning properly.  Discussion with the patient regarding possible left brachial axillary graft will be undertaken.  ____________________________ Katha Cabal,  MD ggs:slb D: 01/08/2012 16:27:26 ET     T: 01/08/2012 17:17:16 ET        JOB#: [id number] cc: Katha Cabal, MD, <Dictator> Katha Cabal MD ELECTRONICALLY SIGNED 01/10/2012 10:03

## 2014-11-07 NOTE — Consult Note (Signed)
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  Her BCx from admission are growing Methacillin Sensitive Staph aureus.  She states that she had Methacillin Resistant Staph aureus, but she cannot give a good history.  When pressed she states that she was hospitalized a year ago with Methacillin Resistant Staph aureus.  Her catheter was not removed at that time.  Her HERO catheter has been removed yesterday. Repeat BCx from today are pending. Will change her antibiotics to cefazolin.  Pharmacy will dose for her renal function. If her BCx are negative, we can place a new perm cath. Will d/c contact isolation.   Electronic Signatures: Sieara Bremer, Heinz Knuckles (MD) (Signed on 22-Apr-13 09:22)  Authored   Last Updated: 22-Apr-13 09:29 by Gabrianna Fassnacht, Heinz Knuckles (MD)

## 2014-11-07 NOTE — Op Note (Signed)
PATIENT NAME:  [name], [name] MR#:  [id number] DATE OF BIRTH:  25-Mar-1956  DATE OF PROCEDURE:  11/05/2011  PREOPERATIVE DIAGNOSES:  1. End-stage renal disease.  2. Status post removal of infected HeRO graft.  3. Morbid obesity.   POSTOPERATIVE DIAGNOSES:  1. End-stage renal disease.  2. Status post removal of infected HeRO graft.  3. Morbid obesity.   PROCEDURES:    1. Ultrasound guidance for vascular access, left femoral vein.  2. Placement of 30 cm long right femoral DuoGlide temporary dialysis catheter.   SURGEON: Algernon Huxley, M.D.   ANESTHESIA: Local.   ESTIMATED BLOOD LOSS: 25 mL.   INDICATION FOR PROCEDURE: This is a 59 year old morbidly obese female on dialysis. She had her HeRO graft removed yesterday for infection. She is now in need of dialysis due to her chronic end-stage renal disease as well as some worsening respiratory insufficiency. Temporary dialysis catheter replaced while she clears her infection and a more permanent type of access will be planned later in the week.   DESCRIPTION OF PROCEDURE: The patient lies flat in the critical care bed. Her left groin was sterilely prepped and draped and a sterile surgical field was created. The left femoral vein was visualized with ultrasound and found to be patent. It was then anesthetized locally with 1% lidocaine overlying the vein and the femoral vein was accessed without difficulty with a Seldinger needle under direct ultrasound guidance. J-wire was placed. After skin nick and dilatation, a DuoGlide dialysis catheter was placed over the wire and the wire was removed. Both ports withdrew dark red nonpulsatile blood and flushed easily with sterile saline. It was secured at 29 cm at the skin with three nylon sutures. A sterile dressing was placed. The patient tolerated the procedure well.    ____________________________ Algernon Huxley, MD jsd:ap D: 11/05/2011 16:13:15 ET T: 11/05/2011 16:50:34 ET JOB#: [id number]  cc: Algernon Huxley, MD, <Dictator> Algernon Huxley MD ELECTRONICALLY SIGNED 11/07/2011 16:01

## 2014-11-10 NOTE — Op Note (Signed)
PATIENT NAME:  [name], [name] MR#:  [id number] DATE OF BIRTH:  06/07/1956  DATE OF PROCEDURE:  06/30/2014  PREOPERATIVE DIAGNOSES: 1.  End-stage renal disease.  2.  Poorly functioning right thigh arteriovenous graft. 3.  Symptomatic hypotension. 4.  Morbid obesity.  POSTOPERATIVE DIAGNOSES:  1.  End-stage renal disease.  2.  Poorly functioning right thigh arteriovenous graft. 3.  Symptomatic hypotension. 4.  Morbid obesity.  PROCEDURE:   1.  Ultrasound guidance for vascular access to right thigh arteriovenous graft. 2.  Right thigh shuntogram and central venogram.   SURGEON: Algernon Huxley, MD   ANESTHESIA: Local with moderate conscious sedation.   ESTIMATED BLOOD LOSS: Minimal.   FLUOROSCOPY TIME: Less than 1 minute; 20 mL of contrast were used.   INDICATION FOR PROCEDURE: [name] well-known to [place] for her dialysis access needs. Her dialysis center said her graft would not work and they feared it was clotted. It is not easily palpable. She is severely hypotensive and very large. She is brought in for shuntogram for further evaluation and potential treatment. Risks and benefits were discussed. Informed consent was obtained.   DESCRIPTION OF PROCEDURE: The patient is brought to the vascular suite. The right thigh was sterilely prepped and draped and a sterile surgical field was created. The graft was accessed in antegrade fashion near the arterial anastomosis under direct ultrasound guidance with a micropuncture needle, and micropuncture wire and sheath were then performed.  Imaging was performed through the micropuncture sheath to evaluate the graft, the iliac vein and the IVC and with compression of the graft, the arterial anastomosis, all were found to be widely patent with brisk flow and no significant stenoses or thromboses were identified.  The sheath was removed, 4-0 Monocryl pursestring suture was placed. Pressure was held. Sterile dressing was placed. The patient  tolerated the procedure well and was taken to the recovery room in stable condition.   ____________________________ Algernon Huxley, MD jsd:LT D: 06/30/2014 11:04:00 ET T: 06/30/2014 19:10:01 ET JOB#: [id number]  cc: Algernon Huxley, MD, <Dictator> Algernon Huxley MD ELECTRONICALLY SIGNED 07/19/2014 14:10

## 2014-11-14 NOTE — H&P (Signed)
PATIENT NAME:  [name], [name] MR#:  [id number] DATE OF BIRTH:  [birth year]  DATE OF ADMISSION:  10/19/2014  PRIMARY CARE PHYSICIAN:  At [facility]. The patient is a resident at [facility].   CHIEF COMPLAINT:  Low blood pressure.   HISTORY OF PRESENT ILLNESS:  [name] is a 59 year old obese Caucasian female with multiple medical problems and has end-stage renal disease on hemodialysis, seizure disorder, and history of tuberous sclerosis, who comes in to the Emergency Room from dialysis, where her blood pressure dropped down to 54/30 and she became symptomatic with lightheadedness and dizziness. She came to the Emergency Room and received a couple 500 mL of IV fluid with normal saline, and her blood pressure went up to 67 systolic to 90, and then during my evaluation, it dropped down in the 80s. The patient during my evaluation was asymptomatic. She is being admitted for further evaluation and management.   The patient has a history of chronic hypotension. She is on midodrine. It has been challenging to manage her hypotension despite her being on midodrine and prednisone as well.  PAST MEDICAL HISTORY: 1.  Seizure disorder.  2.  End-stage renal disease on hemodialysis.  3.  History of tuberous sclerosis.  4.  Hypertension.  5.  Depression.  6.  History of congestive heart failure, systolic, with EF of 25%.  7.  COPD, non-oxygen dependent.  8.  History of obstructive sleep apnea on CPAP.   SOCIAL HISTORY:  Resides at [facility]. Uses wheelchair for ambulation. No alcohol, tobacco, or drug use.   FAMILY HISTORY:  Positive for diabetes and stroke.   ALLERGIES:  CONTRAST, IODINE, MEDROL, AND TRAZODONE.   HOME MEDICATIONS: 1.  Zolpidem 5 mg p.o. daily at bedtime.  2.  Xanax 0.5 mg b.i.d. as needed.  3.  Tylenol 325 two tablets every 6 hourly as needed.  4.  Triamcinolone applied to affected area as needed.  5.  Tramadol 50 mg t.i.d.  6.  Topamax 200 mg b.i.d.  7.   Systane Balance 1 drop to each eye b.i.d.  8.  Symbicort 160/4.5 two puffs 2 times a day.  9.  Sodium chloride nasal spray every 8 hourly.  10.  Sertraline 150 mg p.o. daily.  11.  Senokot 50/8.5 two tablets b.i.d.  12.  Risperdal one injection 37.5 mg intramuscularly every 2 weeks.  13.  Risperdal 0.5 mg p.o. daily at bedtime.  14.  Restasis 0.05% one drop to each eye b.i.d.  15.  Renagel 800 mg 2 tablets 3 times a day.  16.  PhosLo 667 three capsules 3 times a day.  17.  Protonix 40 mg b.i.d.  18.  MiraLax daily.  19.  Midodrine 10 mg 2 tablets t.i.d.  20.  Levothyroxine 50 mcg p.o. daily.  21.  Lamotrigine 25 mg 2 tablets p.o. daily.  22.  Keppra 750 mg on Tuesday, Thursday, and Saturday and 1000 mg on remaining 4 days, which is Monday, Wednesday, Friday and Sunday.  23.  Hydrocortisone 5 mg 3 tablets in the morning and 2 tablets at bedtime.  24.  Gabapentin 100 mg b.i.d.  25.  Fexofenadine 180 mg p.o. daily.  26.  Bisacodyl 10 mg rectal suppository daily.  27.  Bactroban 2% topical applied to affected area once a day.   REVIEW OF SYSTEMS: CONSTITUTIONAL:  Positive for fatigue and weakness.  EYES:  No blurred or double vision, glaucoma, or cataracts.  EARS, NOSE, AND THROAT:  No  tinnitus, ear pain, or hearing loss.  RESPIRATORY:  No cough, wheeze, or hemoptysis.  CARDIOVASCULAR:  No chest pain, orthopnea, or edema.  GASTROINTESTINAL:  No nausea, vomiting, diarrhea, or abdominal pain.  GENITOURINARY:  No dysuria, hematuria, or frequency.  ENDOCRINE:  No polyuria, nocturia, or thyroid problems.  HEMATOLOGIC:  No anemia or easy bruising. The patient does have lesions of tuberous sclerosis on her face around the nose area/nasolabial folds. No CVA, TIA, dysarthria, or dementia.  PSYCHIATRIC:  No anxiety or depression.   All other systems were reviewed and negative.   PHYSICAL EXAMINATION: GENERAL:  The patient is awake, alert, oriented x 3, not in acute distress.  VITAL SIGNS:   Afebrile. Pulse is 70, blood pressure 76/41, saturation 99% on room air.  HEENT:  Atraumatic, normocephalic. PERRLA. EOM are intact. Oral mucosa is moist.  NECK:  Supple. No JVD. No carotid bruit.  LUNGS:  Clear to auscultation bilaterally. Decreased breath sounds at the bases. No respiratory distress or labored breathing.  CARDIOVASCULAR:  Both heart sounds are normal. Rate and rhythm are regular. PMI is not lateralized. Chest is nontender.  EXTREMITIES:  I cannot appreciate much pedal pulses or femoral pulses due to the patient's body habitus.  ABDOMEN:  Obese, soft, nontender. No organomegaly.  SKIN:  Warm and dry. The patient has a dialysis catheter in her right groin.   LABORATORY DATA:  Glucose is 112, BUN 29, creatinine 2.89, sodium 142, and potassium is 3.9. Hemoglobin and hematocrit are 10.7 and 33.6. Platelet count is 110,000.   EKG shows electronic pacemaker changes.   ASSESSMENT:  Ms. Karinne [name] is a 59 year old with history of end-stage renal disease on hemodialysis with chronic hypotension and history of cardiomyopathy with ejection fraction around 25 to 30%, who comes in with:   1.  Symptomatic hypotension. We will admit the patient as stepdown CCU and provide gentle IV hydration. I am going to slow her rate down. She got about a liter and a half of fluid. She has end-stage renal disease and history of cardiomyopathy. We will turn it down to 75 mL an hour. Resume her midodrine 20 mg t.i.d. The case was discussed with Dr. Wynelle Link.  2.  End-stage renal disease on hemodialysis. She completed her dialysis today. We will have nephrology see her in consultation for continuation of dialysis.  3.  Chronic obstructive pulmonary disease, not in exacerbation. Continue home medications.  4.  Gastroesophageal reflux disease. Continue proton pump inhibitor.  5.  History of seizure disorder. Continue Keppra.  6.  History of tuberous sclerosis.  7.  Morbidly obese, who is wheelchair bound  and a resident at [facility]. Discharge planning by care management.  8.  Deep vein thrombosis prophylaxis with subcutaneous heparin t.i.d.   The above was discussed with the patient. No family members present.   CODE STATUS:  The patient is a full code.   TIME SPENT:  50 minutes.   ____________________________ Wylie Hail Allena Katz, MD sap:nb D: 10/19/2014 20:41:14 ET T: 10/19/2014 22:17:03 ET JOB#: [id number]  cc: Mccall Lomax A. Allena Katz, MD, <Dictator> Willow Ora MD ELECTRONICALLY SIGNED 10/26/2014 16:38

## 2014-11-14 NOTE — Op Note (Signed)
PATIENT NAME:  [name], [name] MR#:  [id number] DATE OF BIRTH:  1955-07-22  DATE OF PROCEDURE:  09/06/2014  PREOPERATIVE DIAGNOSES: End-stage renal disease with poorly functioning right thigh arteriovenous graft.   POSTOPERATIVE DIAGNOSES: End-stage renal disease with poorly functioning right thigh arteriovenous graft.   PROCEDURES: 1.  Ultrasound guidance for vascular access to right thigh arteriovenous graft.  2.  Right lower extremity shuntogram and central venogram.  3.  Percutaneous transluminal angioplasty of venous access site with 8 and 9 mm diameter angioplasty balloon.   SURGEON: Algernon Huxley, M.D.   ANESTHESIA: Local with moderate conscious sedation.   ESTIMATED BLOOD LOSS: Minimal.   CONTRAST USED: Approximately 35 mL Visipaque.   FLUOROSCOPY GUIDANCE:  Approximately 2 minutes.   INDICATION FOR PROCEDURE: [name] with end-stage renal disease. Her access is not functioning properly and she was sent from the dialysis center for diminished flows. Risks and benefits of the procedure were discussed. Informed consent was obtained.   DESCRIPTION OF PROCEDURE: The patient is brought to the vascular suite. The right thigh was sterilely prepped and draped, and a sterile surgical field was created. The graft was accessed about 5-7 cm beyond the arterial anastomosis under direct ultrasound guidance without difficulty with a micropuncture needle, and micropuncture wire and sheath were then placed. We upsized to a 6 [place] sheath. 3000 units of intravenous heparin were given for systemic anticoagulation. Imaging was performed. There was an area at the venous access site with a napkin ringlike irregularity likely from multiple sticks and hyperplasia in the region. This appeared to have an approximately 60% stenosis although this a little difficult to identify. The venous anastomosis had only about a 25%-30% stenosis. The iliac vein was patent. The IVC was patent without significant  stenosis. I crossed the lesion without difficulty with a Magic torque wire. I treated initially with an 8 mm diameter angioplasty balloon with a significant waste seen, which resolved at about 10 atmospheres with the balloon inflated. Imaging was performed to opacify the arterial anastomosis which is widely patent. The balloon was deflated. There was still some residual narrowing there, so I upsized to a 9 mm balloon with good angiographic completion result and only about a 20%-25% residual stenosis. At this point, I elected to terminate the procedure. The sheath was removed, 4-0 Monocryl pursestring suture was placed. Pressure was held. Sterile dressings were placed. The patient tolerated the procedure well and was taken to the recovery room in stable condition.    ____________________________ Algernon Huxley, MD jsd:mc D: 09/06/2014 15:49:29 ET T: 09/07/2014 08:19:00 ET JOB#: [id number]  cc: Algernon Huxley, MD, <Dictator> Algernon Huxley MD ELECTRONICALLY SIGNED 09/23/2014 10:34

## 2014-11-14 NOTE — Discharge Summary (Signed)
PATIENT NAME:  [name] [name] MR#:  [id number] DATE OF BIRTH:  [birth year]  DATE OF ADMISSION:  10/19/2014 DATE OF DISCHARGE:  10/22/2014  ADMITTING DIAGNOSIS: Hypertension.   DISCHARGE DIAGNOSES:  1. Hypotension, multifactorial due to cardiomyopathy, as well as some fluid depletion, as well as idiopathic.  2. Right lower quadrant abdominal pain, likely constipation related. Negative CT scan of abdomen and pelvis.  3. Hemorrhoids.  4. End-stage renal disease on hemodialysis.  5. Anemia of chronic kidney disease. 6. Seizure disorder.  7    Tuberous sclerosis.  8. Depression.  9. History of chronic systolic congestive heart failure.  10. Ejection fraction of 25%.  11. Chronic obstructive pulmonary disease on oxygen therapy at home.  12. Obstructive sleep apnea on CPAP at home.  13. Obesity.    DISCHARGE CONDITION: Stable.   DISCHARGE MEDICATIONS:  1. The patient is to continue fexofenadine 180 mg p.o. daily.  2. Topamax 200 mg twice daily. 3. Tylenol 325 mg 2 tablets every 6 hours as needed. 4. MetroCream 0.75% topical cream twice daily.  5. Risperdal Consta 37.5 mg every 2 weeks intramuscular injection.  6. Restasis 0.05% ophthalmic emulsion 1 drop twice daily.  7. Sodium chloride nasal spray 0.65% every 8 hours as needed.  8. Systane 1 drop twice daily. 9. Pantoprazole 40 mg twice daily.  10. MiraLax 17 grams twice daily.  11. Senokot 50/8.6 two tablets twice daily.  12. Symbicort 160/4.5 2 puffs twice daily.   13. Risperidone 0.5 mg daily at bedtime.  14. Ethyl chloride topical spray 100% to right thigh AV graft for a few seconds before accessing, once per dialysis session.  15. Sertraline 150 mg p.o. daily.  16. Lamotrigine 50 mg at bedtime once daily.  17. Lidocaine mouthwash 10 mL 3 times daily as needed. 18. Bisacodyl 10 mg rectal suppository once daily as needed.  19. Triamcinolone topical cream 0.025% to affected area on the face once daily as needed.   20. Tramadol 50 mg every 8 hours as needed.  21. Gabapentin 100 mg twice daily.  22. Keppra 750 mg p.o. on Tuesdays, Thursdays Saturdays, and 1000 mg 4 times a week Mondays, Wednesdays, Fridays and Sundays.  23. Mitogen 20 mg p.o. 3 times daily.  24. PhosLo 667 mg 2 capsules 3 times daily.  25. Levothyroxine 50 mcg p.o. daily.  26. Hydrocortisone 10 mg p.o. at bedtime.  27. Renagel 800 mg 2 tablets 3 times daily.  28. Xanax 0.5 mg once daily as needed.  29. Zolpidem 5 mg p.o. at bedtime as needed.  30. Men-Phor 0.5/0.5 mg topical lotion 3 times daily as needed.  31. Bactroban 2% topical ointment 3 times daily as needed.  32. Xanax 0.5 mg twice daily as needed.  33. Hydrocortisone 15 mg p.o. in the morning.  34. Bactroban 2% topical ointment to affected area once daily.  35. Guaifenesin 600 mg twice daily.  36. Sudafed extended release 120 mg twice daily if needed if blood pressure is low.   HOME OXYGEN: With portable tank at 2 liters of oxygen through nasal cannula.   DIET: Hemodiluted diet with 1500 mL fluid restriction, regular consistency.   ACTIVITY LIMITATIONS: As tolerated.    FOLLOWUP APPOINTMENTS: With Dr. Marisa Hua in 2 days after discharge. Hemodialysis per schedule.   CONSULTANTS: Care management, social work, Dr. Juleen China.   RADIOLOGIC STUDIES: Chest x-ray, one view, 10/19/2014: Revealed cardiomegaly with pulmonary vascular congestion. Mild interstitial edema is possible.   Repeated chest x-ray, portable single  view, 10/20/2014: Showed stable cardiomegaly. No pulmonary venous congestion or focal pulmonary infiltrate. Interval clearing of pulmonary interstitial edema.   CT scan of abdomen and pelvis without IV contrast , but with  oral contrast, 10/21/2014: Showed no findings to explain patient's symptoms. Colonic stool burden is only mildly increased. Multiple small nodules in both lung bases, some of these were not previously imaged. The imaged nodules are stable  from prior CT, 14 months ago. Multiple anterior abdominal wall hernias with  the stomach and transverse colon extending into the most superior one. No evidence of bowel incarceration or obstruction.  No functional renal tissue identified. 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   HISTORY OF PRESENT ILLNESS AND HOSPITAL COURSE: The patient is a 59 year old, Caucasian female, with past medical history significant for history of end-stage renal disease, who was sent with low blood pressure to the Emergency Room. Apparently, the patient's blood pressure was found to be 50/30. She was symptomatic with lightheadedness and dizziness, and she was brought to the Emergency Room for further evaluation. In the Emergency Room, she received some IV fluids after which her blood pressure for a short period of time improved; however, then it dropped down again. She was admitted for observation. Her physical examination was unremarkable.   The patient's laboratory data on admission to the hospital showed elevated glucose level of 112, BUN and creatinine were 29 and 3.89, bicarbonate was 34. Otherwise, BMP was unremarkable. Liver enzymes revealed alkaline phosphatase 277, otherwise liver enzymes were normal. Cardiac enzymes, troponin was 0.03 on the only set. The patient's white blood cell count was normal at 5.5, hemoglobin was 10.7, platelet count was 110,000. MCV was high at 104.   Blood cultures taken on 10/20/2014 showed no growth.   EKG showed demand pacemaker, interpretation based on intrinsic rythm, rate 145 beats per minute, pulmonary disease pattern, ST-T wave abnormality, consider inferior ischemia.   Repeat EKG on 10/19/2014, did not show any significant changes except nonspecific intraventricular conduction block was noted, possible lateral infarct, as well as T-wave abnormality, consider inferior as well as anterior ischemia, but no significant change since prior EKG.   The patient was admitted to  the hospital for further evaluation. She was observed. She was initiated on Sudafed and her blood pressure slowly improved. Her blood cultures remained negative and she was asymptomatic.   She was felt to be stable to be discharged to home on 10/22/2014.   VITAL SIGNS: Her vital signs on 10/22/2014, temperature was 98.3, pulse was 60, respiration was 18, blood pressure 83/54, saturation was 94% on room air at rest.   In regards to her chronic medical problems such as cardiomyopathy, hypotension, history of tuberous sclerosis, depression, COPD, obstructive sleep apnea, on CPAP, obesity,  end-stage renal disease, she is to continue her outpatient medications. The patient was complaining of some abdominal discomfort. She had an abdominal CT scan, which showed no significant abnormalities. It was felt that the patient's abdominal discomfort could have been related to constipation, as well as possibly cough. For hemorrhoids, she also was given Anusol.   TIME SPENT: 40 minutes.   ____________________________ Theodoro Grist, MD rv:JT D: 10/22/2014 08:08:04 ET T: 10/22/2014 09:15:32 ET JOB#: [id number]  cc: Theodoro Grist, MD, <Dictator> Dr. _____ Theodoro Grist MD ELECTRONICALLY SIGNED 10/24/2014 16:39

## 2014-11-16 ENCOUNTER — Inpatient Hospital Stay
Admission: EM | Admit: 2014-11-16 | Discharge: 2014-11-20 | DRG: 252 | Disposition: A | Discharge status: Skilled Nursing Facility | Payer: Medicare Other | Attending: Internal Medicine | Admitting: Internal Medicine

## 2014-11-16 ENCOUNTER — Encounter: Payer: Self-pay | Admitting: Emergency Medicine

## 2014-11-16 ENCOUNTER — Other Ambulatory Visit: Payer: Self-pay | Admitting: Internal Medicine

## 2014-11-16 DIAGNOSIS — I959 Hypotension, unspecified: Secondary | ICD-10-CM | POA: Diagnosis present

## 2014-11-16 DIAGNOSIS — Z6841 Body Mass Index (BMI) 40.0 and over, adult: Secondary | ICD-10-CM | POA: Diagnosis not present

## 2014-11-16 DIAGNOSIS — F329 Major depressive disorder, single episode, unspecified: Secondary | ICD-10-CM | POA: Diagnosis present

## 2014-11-16 DIAGNOSIS — T82818A Embolism of vascular prosthetic devices, implants and grafts, initial encounter: Secondary | ICD-10-CM | POA: Diagnosis present

## 2014-11-16 DIAGNOSIS — N186 End stage renal disease: Secondary | ICD-10-CM | POA: Diagnosis present

## 2014-11-16 DIAGNOSIS — Z8614 Personal history of Methicillin resistant Staphylococcus aureus infection: Secondary | ICD-10-CM | POA: Diagnosis not present

## 2014-11-16 DIAGNOSIS — J449 Chronic obstructive pulmonary disease, unspecified: Secondary | ICD-10-CM | POA: Diagnosis present

## 2014-11-16 DIAGNOSIS — D649 Anemia, unspecified: Secondary | ICD-10-CM | POA: Diagnosis present

## 2014-11-16 DIAGNOSIS — F79 Unspecified intellectual disabilities: Secondary | ICD-10-CM | POA: Diagnosis present

## 2014-11-16 DIAGNOSIS — T82868A Thrombosis of vascular prosthetic devices, implants and grafts, initial encounter: Secondary | ICD-10-CM | POA: Diagnosis present

## 2014-11-16 DIAGNOSIS — Z9989 Dependence on other enabling machines and devices: Secondary | ICD-10-CM | POA: Diagnosis not present

## 2014-11-16 DIAGNOSIS — J45909 Unspecified asthma, uncomplicated: Secondary | ICD-10-CM | POA: Diagnosis present

## 2014-11-16 DIAGNOSIS — G4730 Sleep apnea, unspecified: Secondary | ICD-10-CM | POA: Diagnosis present

## 2014-11-16 DIAGNOSIS — Y832 Surgical operation with anastomosis, bypass or graft as the cause of abnormal reaction of the patient, or of later complication, without mention of misadventure at the time of the procedure: Secondary | ICD-10-CM | POA: Diagnosis present

## 2014-11-16 DIAGNOSIS — M1990 Unspecified osteoarthritis, unspecified site: Secondary | ICD-10-CM | POA: Diagnosis present

## 2014-11-16 DIAGNOSIS — K219 Gastro-esophageal reflux disease without esophagitis: Secondary | ICD-10-CM | POA: Diagnosis present

## 2014-11-16 DIAGNOSIS — E875 Hyperkalemia: Secondary | ICD-10-CM | POA: Diagnosis present

## 2014-11-16 DIAGNOSIS — E039 Hypothyroidism, unspecified: Secondary | ICD-10-CM | POA: Diagnosis present

## 2014-11-16 DIAGNOSIS — Z950 Presence of cardiac pacemaker: Secondary | ICD-10-CM | POA: Diagnosis not present

## 2014-11-16 DIAGNOSIS — R001 Bradycardia, unspecified: Secondary | ICD-10-CM | POA: Diagnosis present

## 2014-11-16 DIAGNOSIS — T82898A Other specified complication of vascular prosthetic devices, implants and grafts, initial encounter: Secondary | ICD-10-CM

## 2014-11-16 DIAGNOSIS — Z8711 Personal history of peptic ulcer disease: Secondary | ICD-10-CM

## 2014-11-16 DIAGNOSIS — Q851 Tuberous sclerosis: Secondary | ICD-10-CM | POA: Diagnosis not present

## 2014-11-16 DIAGNOSIS — N289 Disorder of kidney and ureter, unspecified: Secondary | ICD-10-CM | POA: Diagnosis present

## 2014-11-16 DIAGNOSIS — G40909 Epilepsy, unspecified, not intractable, without status epilepticus: Secondary | ICD-10-CM | POA: Diagnosis present

## 2014-11-16 DIAGNOSIS — Z992 Dependence on renal dialysis: Secondary | ICD-10-CM

## 2014-11-16 DIAGNOSIS — I509 Heart failure, unspecified: Secondary | ICD-10-CM | POA: Diagnosis present

## 2014-11-16 DIAGNOSIS — T8249XA Other complication of vascular dialysis catheter, initial encounter: Secondary | ICD-10-CM

## 2014-11-16 DIAGNOSIS — T82590A Other mechanical complication of surgically created arteriovenous fistula, initial encounter: Secondary | ICD-10-CM | POA: Diagnosis present

## 2014-11-16 LAB — CBC WITH DIFFERENTIAL/PLATELET
Basophils Absolute: 0 10*3/uL (ref 0–0.1)
Basophils Relative: 1 %
Eosinophils Absolute: 0.1 10*3/uL (ref 0–0.7)
Eosinophils Relative: 3 %
HCT: 33.2 % — ABNORMAL LOW (ref 35.0–47.0)
Hemoglobin: 10.8 g/dL — ABNORMAL LOW (ref 12.0–16.0)
Lymphocytes Relative: 29 %
Lymphs Abs: 1.1 10*3/uL (ref 1.0–3.6)
MCH: 34.2 pg — ABNORMAL HIGH (ref 26.0–34.0)
MCHC: 32.6 g/dL (ref 32.0–36.0)
MCV: 104.8 fL — ABNORMAL HIGH (ref 80.0–100.0)
Monocytes Absolute: 0.2 10*3/uL (ref 0.2–0.9)
Monocytes Relative: 6 %
Neutro Abs: 2.3 10*3/uL (ref 1.4–6.5)
Neutrophils Relative %: 63 %
Platelets: 105 10*3/uL — ABNORMAL LOW (ref 150–440)
RBC: 3.17 MIL/uL — ABNORMAL LOW (ref 3.80–5.20)
RDW: 20.3 % — ABNORMAL HIGH (ref 11.5–14.5)
WBC: 3.7 10*3/uL (ref 3.6–11.0)

## 2014-11-16 LAB — COMPREHENSIVE METABOLIC PANEL
ALT: 14 U/L (ref 14–54)
AST: 25 U/L (ref 15–41)
Albumin: 3.7 g/dL (ref 3.5–5.0)
Alkaline Phosphatase: 303 U/L — ABNORMAL HIGH (ref 38–126)
Anion gap: 14 (ref 5–15)
BUN: 77 mg/dL — ABNORMAL HIGH (ref 6–20)
CALCIUM: 8.9 mg/dL (ref 8.9–10.3)
CHLORIDE: 100 mmol/L — AB (ref 101–111)
CO2: 23 mmol/L (ref 22–32)
CREATININE: 9.12 mg/dL — AB (ref 0.44–1.00)
GFR calc Af Amer: 5 mL/min — ABNORMAL LOW (ref >60)
GFR calc non Af Amer: 4 mL/min — ABNORMAL LOW (ref >60)
GLUCOSE: 97 mg/dL (ref 65–99)
POTASSIUM: 6.8 mmol/L — AB (ref 3.5–5.1)
Sodium: 137 mmol/L (ref 135–145)
Total Bilirubin: 1.4 mg/dL — ABNORMAL HIGH (ref 0.3–1.2)
Total Protein: 7 g/dL (ref 6.5–8.1)

## 2014-11-16 MED ORDER — SODIUM POLYSTYRENE SULFONATE 15 GM/60ML PO SUSP
30.0000 g | Freq: Once | ORAL | Status: AC
  Administered 2014-11-16: 30 g via ORAL

## 2014-11-16 MED ORDER — BISACODYL 5 MG PO TBEC
10.0000 mg | DELAYED_RELEASE_TABLET | Freq: Every day | ORAL | Status: DC | PRN
  Administered 2014-11-20: 10 mg via ORAL
  Filled 2014-11-16 (×2): qty 2

## 2014-11-16 MED ORDER — CALCIUM ACETATE (PHOS BINDER) 667 MG PO CAPS
2001.0000 mg | ORAL_CAPSULE | Freq: Three times a day (TID) | ORAL | Status: DC
  Administered 2014-11-17 – 2014-11-19 (×5): 2001 mg via ORAL
  Filled 2014-11-16 (×6): qty 3

## 2014-11-16 MED ORDER — INSULIN REGULAR HUMAN 100 UNIT/ML IJ SOLN: 10.0000 [IU] | Freq: Once | INTRAMUSCULAR | Status: DC

## 2014-11-16 MED ORDER — BUDESONIDE-FORMOTEROL FUMARATE 160-4.5 MCG/ACT IN AERO
2.0000 | INHALATION_SPRAY | Freq: Two times a day (BID) | RESPIRATORY_TRACT | Status: DC
  Administered 2014-11-17 – 2014-11-20 (×8): 2 via RESPIRATORY_TRACT
  Filled 2014-11-16 (×2): qty 6

## 2014-11-16 MED ORDER — SODIUM CHLORIDE 0.9 % IV SOLN: 1.0000 g | Freq: Once | INTRAVENOUS | Status: DC

## 2014-11-16 MED ORDER — LAMOTRIGINE 25 MG PO TABS
50.0000 mg | ORAL_TABLET | Freq: Every day | ORAL | Status: DC
  Administered 2014-11-17 – 2014-11-19 (×4): 50 mg via ORAL
  Filled 2014-11-16 (×5): qty 2

## 2014-11-16 MED ORDER — TRAMADOL HCL 50 MG PO TABS: 50.0000 mg | ORAL_TABLET | Freq: Three times a day (TID) | ORAL | Status: DC | PRN

## 2014-11-16 MED ORDER — METRONIDAZOLE 0.75 % EX CREA
TOPICAL_CREAM | Freq: Two times a day (BID) | CUTANEOUS | Status: DC
  Administered 2014-11-17 – 2014-11-20 (×6): via TOPICAL
  Filled 2014-11-16: qty 45

## 2014-11-16 MED ORDER — MIDODRINE HCL 5 MG PO TABS
20.0000 mg | ORAL_TABLET | Freq: Three times a day (TID) | ORAL | Status: DC
  Administered 2014-11-17 – 2014-11-20 (×11): 20 mg via ORAL
  Filled 2014-11-16 (×11): qty 4

## 2014-11-16 MED ORDER — SEVELAMER CARBONATE 800 MG PO TABS
800.0000 mg | ORAL_TABLET | Freq: Three times a day (TID) | ORAL | Status: DC
  Administered 2014-11-17: 800 mg via ORAL
  Filled 2014-11-16: qty 1

## 2014-11-16 MED ORDER — TOPIRAMATE 100 MG PO TABS
200.0000 mg | ORAL_TABLET | Freq: Two times a day (BID) | ORAL | Status: DC
  Administered 2014-11-17 – 2014-11-20 (×8): 200 mg via ORAL
  Filled 2014-11-16 (×9): qty 2

## 2014-11-16 MED ORDER — SODIUM POLYSTYRENE SULFONATE 15 GM/60ML PO SUSP
ORAL | Status: AC
  Administered 2014-11-16: 30 g via ORAL
  Filled 2014-11-16: qty 120

## 2014-11-16 MED ORDER — POLYETHYLENE GLYCOL 3350 17 G PO PACK
17.0000 g | PACK | Freq: Two times a day (BID) | ORAL | Status: DC
  Administered 2014-11-17 – 2014-11-20 (×6): 17 g via ORAL
  Filled 2014-11-16 (×6): qty 1

## 2014-11-16 MED ORDER — GABAPENTIN 100 MG PO CAPS
100.0000 mg | ORAL_CAPSULE | Freq: Two times a day (BID) | ORAL | Status: DC
  Administered 2014-11-17 – 2014-11-20 (×8): 100 mg via ORAL
  Filled 2014-11-16 (×8): qty 1

## 2014-11-16 MED ORDER — ONDANSETRON HCL 4 MG/2ML IJ SOLN: 4.0000 mg | Freq: Four times a day (QID) | INTRAMUSCULAR | Status: DC | PRN

## 2014-11-16 MED ORDER — SERTRALINE HCL 50 MG PO TABS
100.0000 mg | ORAL_TABLET | Freq: Every day | ORAL | Status: DC
Start: 2014-11-16 — End: 2014-11-20
  Administered 2014-11-17 – 2014-11-19 (×2): 100 mg via ORAL
  Filled 2014-11-16 (×2): qty 2
  Filled 2014-11-16: qty 1

## 2014-11-16 MED ORDER — HEPARIN SODIUM (PORCINE) 5000 UNIT/ML IJ SOLN
5000.0000 [IU] | Freq: Three times a day (TID) | INTRAMUSCULAR | Status: DC
  Administered 2014-11-17 – 2014-11-20 (×10): 5000 [IU] via SUBCUTANEOUS
  Filled 2014-11-16 (×11): qty 1

## 2014-11-16 MED ORDER — LORATADINE 10 MG PO TABS
10.0000 mg | ORAL_TABLET | Freq: Every day | ORAL | Status: DC
  Administered 2014-11-17 – 2014-11-20 (×4): 10 mg via ORAL
  Filled 2014-11-16 (×4): qty 1

## 2014-11-16 MED ORDER — ACETAMINOPHEN 325 MG PO TABS
650.0000 mg | ORAL_TABLET | Freq: Four times a day (QID) | ORAL | Status: DC | PRN
  Administered 2014-11-17 – 2014-11-19 (×3): 650 mg via ORAL
  Filled 2014-11-16 (×3): qty 2

## 2014-11-16 MED ORDER — DEXTROSE 50 % IV SOLN
1.0000 | Freq: Once | INTRAVENOUS | Status: AC
  Administered 2014-11-16: 50 mL via INTRAVENOUS

## 2014-11-16 MED ORDER — LEVETIRACETAM 500 MG PO TABS: 1000.0000 mg | ORAL_TABLET | ORAL | Status: DC | PRN

## 2014-11-16 MED ORDER — CYCLOSPORINE 0.05 % OP EMUL
1.0000 [drp] | Freq: Two times a day (BID) | OPHTHALMIC | Status: DC
  Administered 2014-11-17: 01:00:00 via OPHTHALMIC
  Administered 2014-11-17 – 2014-11-20 (×7): 1 [drp] via OPHTHALMIC
  Filled 2014-11-16 (×12): qty 1

## 2014-11-16 MED ORDER — SALINE SPRAY 0.65 % NA SOLN
1.0000 | Freq: Three times a day (TID) | NASAL | Status: DC | PRN
  Filled 2014-11-16: qty 44

## 2014-11-16 MED ORDER — SERTRALINE HCL 50 MG PO TABS
50.0000 mg | ORAL_TABLET | Freq: Every day | ORAL | Status: DC
  Administered 2014-11-17 – 2014-11-20 (×4): 50 mg via ORAL
  Filled 2014-11-16 (×4): qty 1

## 2014-11-16 MED ORDER — SEVELAMER CARBONATE 800 MG PO TABS
800.0000 mg | ORAL_TABLET | Freq: Three times a day (TID) | ORAL | Status: DC
  Administered 2014-11-17 – 2014-11-19 (×5): 800 mg via ORAL
  Filled 2014-11-16 (×6): qty 1

## 2014-11-16 MED ORDER — RISPERIDONE 0.5 MG PO TABS
0.5000 mg | ORAL_TABLET | Freq: Every day | ORAL | Status: DC
Start: 2014-11-16 — End: 2014-11-20
  Administered 2014-11-17 – 2014-11-19 (×4): 0.5 mg via ORAL
  Filled 2014-11-16 (×4): qty 1

## 2014-11-16 MED ORDER — ZOLPIDEM TARTRATE 5 MG PO TABS: 5.0000 mg | ORAL_TABLET | Freq: Every evening | ORAL | Status: DC | PRN

## 2014-11-16 MED ORDER — SODIUM CHLORIDE 0.9 % IV SOLN: 125.0000 mg | INTRAVENOUS | Status: DC

## 2014-11-16 MED ORDER — CALCIUM ACETATE (PHOS BINDER) 667 MG PO CAPS: 2001.0000 mg | ORAL_CAPSULE | Freq: Three times a day (TID) | ORAL | Status: DC

## 2014-11-16 MED ORDER — ALPRAZOLAM 0.25 MG PO TABS: 0.2500 mg | ORAL_TABLET | Freq: Three times a day (TID) | ORAL | Status: DC | PRN

## 2014-11-16 MED ORDER — LEVETIRACETAM 750 MG PO TABS
750.0000 mg | ORAL_TABLET | ORAL | Status: DC | PRN
  Filled 2014-11-16: qty 1

## 2014-11-16 MED ORDER — ONDANSETRON HCL 4 MG PO TABS: 4.0000 mg | ORAL_TABLET | Freq: Four times a day (QID) | ORAL | Status: DC | PRN

## 2014-11-16 MED ORDER — DEXTROSE 50 % IV SOLN
INTRAVENOUS | Status: AC
  Administered 2014-11-16: 50 mL via INTRAVENOUS
  Filled 2014-11-16: qty 50

## 2014-11-16 MED ORDER — FLUDROCORTISONE ACETATE 0.1 MG PO TABS
0.1000 mg | ORAL_TABLET | Freq: Every day | ORAL | Status: DC
  Administered 2014-11-17 – 2014-11-20 (×5): 0.1 mg via ORAL
  Filled 2014-11-16 (×5): qty 1

## 2014-11-16 MED ORDER — LEVOTHYROXINE SODIUM 50 MCG PO TABS
50.0000 ug | ORAL_TABLET | Freq: Every day | ORAL | Status: DC
  Administered 2014-11-18 – 2014-11-20 (×3): 50 ug via ORAL
  Filled 2014-11-16 (×3): qty 1

## 2014-11-16 MED ORDER — HYDROCORTISONE 10 MG PO TABS: 10.0000 mg | ORAL_TABLET | Freq: Every day | ORAL | Status: DC

## 2014-11-16 MED ORDER — MIDODRINE HCL 5 MG PO TABS
20.0000 mg | ORAL_TABLET | Freq: Three times a day (TID) | ORAL | Status: DC
  Administered 2014-11-16: 20 mg via ORAL
  Filled 2014-11-16 (×5): qty 4

## 2014-11-16 MED ORDER — RISPERIDONE MICROSPHERES 37.5 MG IM SUSR
37.5000 mg | INTRAMUSCULAR | Status: DC
  Filled 2014-11-16: qty 2

## 2014-11-16 MED ORDER — INSULIN ASPART 100 UNIT/ML ~~LOC~~ SOLN
10.0000 [IU] | Freq: Once | SUBCUTANEOUS | Status: AC
  Administered 2014-11-16: 10 [IU] via SUBCUTANEOUS
  Filled 2014-11-16: qty 10

## 2014-11-16 MED ORDER — INSULIN ASPART 100 UNIT/ML ~~LOC~~ SOLN
SUBCUTANEOUS | Status: AC
  Administered 2014-11-16: 10 [IU] via SUBCUTANEOUS
  Filled 2014-11-16: qty 10

## 2014-11-16 MED ORDER — CEFAZOLIN SODIUM 1-5 GM-% IV SOLN: 1.0000 g | INTRAVENOUS | Status: DC

## 2014-11-16 MED ORDER — CALCIUM GLUCONATE 10 % IV SOLN
INTRAVENOUS | Status: AC
  Administered 2014-11-16: 1 g via INTRAVENOUS
  Filled 2014-11-16: qty 10

## 2014-11-16 MED ORDER — TRIAMCINOLONE ACETONIDE 0.025 % EX CREA
TOPICAL_CREAM | Freq: Two times a day (BID) | CUTANEOUS | Status: DC
  Administered 2014-11-17 – 2014-11-20 (×7): via TOPICAL
  Filled 2014-11-16: qty 15

## 2014-11-16 MED ORDER — HYDROCORTISONE ACETATE 25 MG RE SUPP
25.0000 mg | Freq: Two times a day (BID) | RECTAL | Status: DC
  Administered 2014-11-17 – 2014-11-20 (×7): 25 mg via RECTAL
  Filled 2014-11-16 (×8): qty 1

## 2014-11-16 MED ORDER — DOCUSATE SODIUM 100 MG PO CAPS
100.0000 mg | ORAL_CAPSULE | Freq: Two times a day (BID) | ORAL | Status: DC
Start: 2014-11-16 — End: 2014-11-20
  Administered 2014-11-17 – 2014-11-20 (×8): 100 mg via ORAL
  Filled 2014-11-16 (×8): qty 1

## 2014-11-16 MED ORDER — CALCITRIOL 0.25 MCG PO CAPS
0.2500 ug | ORAL_CAPSULE | ORAL | Status: DC
  Administered 2014-11-18 – 2014-11-20 (×2): 0.25 ug via ORAL
  Filled 2014-11-16 (×2): qty 1

## 2014-11-16 MED ORDER — CALCIUM GLUCONATE 10 % IV SOLN
1.0000 g | Freq: Once | INTRAVENOUS | Status: AC
  Administered 2014-11-16: 1 g via INTRAVENOUS

## 2014-11-16 NOTE — ED Notes (Signed)
Lab called and stated that lavender top tube would need a redraw. Dr. Archie Balboa declined the redraw.

## 2014-11-16 NOTE — ED Notes (Signed)
Pt was at dialysis today and her port was clotted. She was able to get her treatment on Saturday without difficulty. She denies any bleeding or anything else with her cath. Her cath is in her right leg.

## 2014-11-16 NOTE — H&P (Signed)
[facility] NAME: [name]    MR#:  [id number]  DATE OF BIRTH:  [birth year]  DATE OF ADMISSION:  11/16/2014  PRIMARY CARE PHYSICIAN: Alvester Morin, MD   REQUESTING/REFERRING PHYSICIAN: Charolett Bumpers, MD  CHIEF COMPLAINT:    HISTORY OF PRESENT ILLNESS:  [name]  is a 59 y.o. female with a known history of dialysis access failures who presents to the emergency department after being unable to undergo dialysis this morning due to a clotted AV graft. The patient denies pain or fevers. She has not had any chest pain shortness of breath nausea vomiting or diarrhea. Prior to admission the emergency department staff spoke with vascular surgery who stated the patient will be placed on the operating room schedule for tomorrow morning. Thus the emergency department staff called for admission and management of her medical issues prior to her procedure.  PAST MEDICAL HISTORY:   Past Medical History  Diagnosis Date  . Renal insufficiency   . Mental retardation   . Sleep apnea   . End-stage renal disease on hemodialysis   . Tuberous sclerosis   . Obesity   . GERD (gastroesophageal reflux disease)   . Depression   . Mental retardation   . Sleep apnea     on c-pap  . Hx MRSA infection   . Obesity   . Complication of anesthesia     "sometimes I don't wake up on time; depends on how much med"  . Asthma   . 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 Osteoarthritis   . Angina   . CHF (congestive heart failure)   . Shortness of breath     "all the time"  . Pneumonia ? 2006  . Renal failure   . ESRD (end stage renal disease) on dialysis 05/25/11    Tu, Th, Sa  . Blood transfusion   . Anemia   . Stomach ulcer   . Constipated   . Seizures     last seizure 05/21/11  . Pacemaker     AV paced    PAST SURGICAL HISTOIRY:   Past Surgical History  Procedure Laterality Date  . Uterine tumor  05/25/11    pt denies this history  .  Nephrectomy  01/08/1994; 01/15/2006    left; right  . Shunt in arm for dialysis      right   . Thrombectomy  05/2010    shunt in my right arm  . Cholecystectomy  07/02/1990  . Tonsillectomy  03/03/1978  . Avgg removal  06-26-11    Right arm AVG removed.  . Pacemaker placement    . Vas ins tunn cv cath 5 or older ([facility] hx)      both legs  . Av fistula placement      L  . Arteriovenous graft placement      R arm    SOCIAL HISTORY:   History  Substance Use Topics  . Smoking status: Never Smoker   . Smokeless tobacco: Never Used  . Alcohol Use: No    FAMILY HISTORY:   Family History  Problem Relation Age of Onset  . Diabetes Father   . Asthma Son     DRUG ALLERGIES:   Allergies  Allergen Reactions  . Iodinated Diagnostic Agents Hives  . Iodine     Other reaction(s): Other (See Comments) Other Reaction: contrast-itching and flushing  . Methylprednisolone Hives  . Other Itching    Other reaction(s): Other (See Comments)  Uncoded Allergy. Allergen: iv contrast dye, Other Reaction: flushing Arthritis medications Other reaction(s): Other (See Comments) Uncoded Allergy. Allergen: iv contrast dye, Other Reaction: flushing Arthritis medications  . Pollen Extract Hives  . Trimethadione     Other reaction(s): Other (See Comments) Other Reaction: Other reaction  . Trazodone And Nefazodone Hives and Rash    REVIEW OF SYSTEMS:  CONSTITUTIONAL: No fever, fatigue or weakness.  EYES: No blurred or double vision.  EARS, NOSE, AND THROAT: No tinnitus or ear pain.  RESPIRATORY: No cough, shortness of breath, wheezing or hemoptysis.  CARDIOVASCULAR: No chest pain, orthopnea, edema.  GASTROINTESTINAL: No nausea, vomiting, diarrhea or abdominal pain.  GENITOURINARY: No dysuria, hematuria.  ENDOCRINE: No polyuria, nocturia,  HEMATOLOGY: No anemia, easy bruising or bleeding SKIN: No rash or lesion. MUSCULOSKELETAL: No joint pain or arthritis.   NEUROLOGIC: No tingling, numbness,  weakness.  PSYCHIATRY: No anxiety or depression.   MEDICATIONS AT HOME:   Prior to Admission medications   Medication Sig Start Date End Date Taking? Authorizing Provider  acetaminophen (TYLENOL) 325 MG tablet Take 650 mg by mouth every 6 (six) hours as needed for mild pain.    Historical Provider, MD  ALPRAZolam Duanne Moron) 0.25 MG tablet Take 1 mg by mouth See admin instructions. Takes pre-dialysis on Tuesday, Thursday, and Saturday    Historical Provider, MD  ALPRAZolam Duanne Moron) 0.5 MG tablet  10/14/14   Historical Provider, MD  antiseptic oral rinse (BIOTENE) LIQD 10 mLs by Mouth Rinse route as needed for dry mouth.    Historical Provider, MD  bisacodyl (DULCOLAX) 5 MG EC tablet Take 2 tablets (10 mg total) by mouth daily as needed for moderate constipation. 09/14/14   Allie Bossier, MD  calcitRIOL (ROCALTROL) 0.25 MCG capsule Take 1 capsule (0.25 mcg total) by mouth Every Tuesday,Thursday,and Saturday with dialysis. 09/14/14   Allie Bossier, MD  calcium acetate (PHOSLO) 667 MG capsule Take 2,001 mg by mouth 3 (three) times daily.     Historical Provider, MD  Darbepoetin Alfa (ARANESP) 200 MCG/0.4ML SOSY injection Inject 0.4 mLs (200 mcg total) into the vein every Tuesday with hemodialysis. 09/14/14   Allie Bossier, MD  ferric gluconate 125 mg in sodium chloride 0.9 % 100 mL Inject 125 mg into the vein every Tuesday with hemodialysis. 09/14/14   Allie Bossier, MD  fexofenadine (ALLEGRA) 180 MG tablet Take 180 mg by mouth daily. For allergies    Historical Provider, MD  fludrocortisone (FLORINEF) 0.1 MG tablet  08/27/14   Historical Provider, MD  gabapentin (NEURONTIN) 100 MG capsule Take 100 mg by mouth 2 (two) times daily.    Historical Provider, MD  hydrocortisone (ANUSOL-HC) 25 MG suppository Place 1 suppository (25 mg total) rectally 2 (two) times daily. 09/14/14   Allie Bossier, MD  hydrocortisone (CORTEF) 10 MG tablet  09/17/14   Historical Provider, MD  hydrocortisone (CORTEF) 5 MG tablet  10/22/14    Historical Provider, MD  lamoTRIgine (LAMICTAL) 25 MG tablet Take 50 mg by mouth at bedtime.  05/07/14   Historical Provider, MD  levETIRAcetam (KEPPRA) 1000 MG tablet Sun/Mon/Wen/Fri 09/14/14   Allie Bossier, MD  levETIRAcetam (KEPPRA) 250 MG tablet  08/27/14   Historical Provider, MD  levETIRAcetam (KEPPRA) 750 MG tablet Tue/Th/Sat 09/14/14   Allie Bossier, MD  levothyroxine (SYNTHROID, LEVOTHROID) 50 MCG tablet Take 1 tablet (50 mcg total) by mouth daily before breakfast. 09/14/14   Allie Bossier, MD  metroNIDAZOLE (METROCREAM) 0.75 % cream Apply vaginally if  pt is still symptomatic 11/12/14   Historical Provider, MD  midodrine (PROAMATINE) 10 MG tablet Take 2 tablets (20 mg total) by mouth 3 (three) times daily. 09/14/14   Allie Bossier, MD  pantoprazole (PROTONIX) 40 MG tablet Take 1 tablet (40 mg total) by mouth 2 (two) times daily. 09/14/14 09/14/15  Allie Bossier, MD  polyethylene glycol [facility] / Floria Raveling) packet Take 17 g by mouth 2 (two) times daily.    Historical Provider, MD  Propylene Glycol 0.6 % SOLN Apply 1 drop to eye 2 (two) times daily.    Historical Provider, MD  RENVELA 800 MG tablet  08/27/14   Historical Provider, MD  RESTASIS 0.05 % ophthalmic emulsion Place 1 drop into both eyes 2 (two) times daily.  06/01/14   Historical Provider, MD  risperiDONE (RISPERDAL) 0.5 MG tablet Take 0.5 mg by mouth at bedtime.  06/04/14   Historical Provider, MD  risperiDONE microspheres (RISPERDAL CONSTA) 37.5 MG injection Inject 37.5 mg into the muscle every 14 (fourteen) days.    Historical Provider, MD  senna-docusate (SENOKOT-S) 8.6-50 MG per tablet Take 2 tablets by mouth 2 (two) times daily.     Historical Provider, MD  sertraline (ZOLOFT) 100 MG tablet Take 100 mg by mouth daily. Take with 50mg  tablet to equal 150mg     Historical Provider, MD  sertraline (ZOLOFT) 50 MG tablet Take 50 mg by mouth daily. Take with 100mg  tablet to equal 150mg     Historical Provider, MD  sevelamer (RENAGEL) 800 MG  tablet Take 1,600 mg by mouth 3 (three) times daily with meals.     Historical Provider, MD  sevelamer (RENAGEL) 800 MG tablet Take 1 tablet by mouth daily.    Historical Provider, MD  sodium chloride (OCEAN) 0.65 % SOLN nasal spray Place 1 spray into both nostrils every 8 (eight) hours as needed for congestion.    Historical Provider, MD  SYMBICORT 160-4.5 MCG/ACT inhaler Inhale 2 puffs into the lungs 2 (two) times daily.  05/25/14   Historical Provider, MD  topiramate (TOPAMAX) 200 MG tablet Take 200 mg by mouth 2 (two) times daily.      Historical Provider, MD  traMADol (ULTRAM) 50 MG tablet Take 50 mg by mouth every 8 (eight) hours as needed (pain).     Historical Provider, MD  triamcinolone (KENALOG) 0.025 % cream Apply to affected area 09/15/14   Historical Provider, MD  zolpidem (AMBIEN) 5 MG tablet Take 1 tablet (5 mg total) by mouth at bedtime as needed for sleep. 09/14/14   Allie Bossier, MD      VITAL SIGNS:  Blood pressure 88/54, pulse 59, temperature 97.4 F (36.3 C), temperature source Oral, resp. rate 9, height 5\' 6"  (1.676 m), weight 146.7 kg (323 lb 6.6 oz), SpO2 99 %.  PHYSICAL EXAMINATION:  GENERAL:  59 y.o.-year-old patient lying in the bed with no acute distress.  EYES: Pupils equal, round, reactive to light and accommodation. No scleral icterus. Extraocular muscles intact.  HEENT: Head atraumatic, normocephalic. Oropharynx and nasopharynx clear.  NECK:  Supple, no jugular venous distention. No thyroid enlargement, no tenderness.  LUNGS: Normal breath sounds bilaterally, no wheezing, rales,rhonchi or crepitation. No use of accessory muscles of respiration.  CARDIOVASCULAR: S1, S2 normal. No murmurs, rubs, or gallops.  ABDOMEN: Soft, nontender, nondistended. Bowel sounds present. No organomegaly or mass.  EXTREMITIES: No pedal edema, cyanosis, or clubbing.  NEUROLOGIC: Cranial nerves II through XII are intact. Muscle strength 5/5 in all extremities. Sensation intact. Gait  not  checked.  PSYCHIATRIC: The patient is alert and oriented x 3.  SKIN: No obvious rash, lesion, or ulcer.   LABORATORY PANEL:   CBC  Recent Labs Lab 11/16/14 1811  WBC 3.7  HGB 10.8*  HCT 33.2*  PLT 105*   ------------------------------------------------------------------------------------------------------------------  Chemistries   Recent Labs Lab 11/16/14 1503  NA 137  K 6.8*  CL 100*  CO2 23  GLUCOSE 97  BUN 77*  CREATININE 9.12*  CALCIUM 8.9  AST 25  ALT 14  ALKPHOS 303*  BILITOT 1.4*   ------------------------------------------------------------------------------------------------------------------  Cardiac Enzymes No results for input(s): TROPONINI in the last 168 hours. ------------------------------------------------------------------------------------------------------------------  RADIOLOGY:  No results found.  EKG:   Orders placed or performed during the hospital encounter of 11/16/14  . ED EKG  . ED EKG  . EKG 12-Lead  . EKG 12-Lead    IMPRESSION AND PLAN:   59 year old female admitted for AV graft declotting.  1. AV graft: vascular surgery to attempt thrombectomy in the morning. 2. ESRD: consult nephrology for dialysis (perhaps Wednesday after clot lysis).  Cont Renvela.  Potassium is elevated due to missed dialysis. We'll shift intracellularly potassium concentration with insulin and Kayexalate. I have also given calcium gluconate. There are no EKG changes.  3. Epilepsy: continue Keppra, Lamictal, and Topamax per home regimen 4. Hypotension: chronic problem.  Cont midodrine, Florinef, and hydrocortisone  5. Hypothyroidism: continue Synthroid 6. Depression: cont Zoloft and risperidone 7. Asthma/COPD: cont Symbicort (clarify diagnosis) 8. DVT prophylaxis: Heparin 9. GI prophylaxis: pantoprazole due to history of GERD.    All the records are reviewed and case discussed with ED provider. Management plans discussed with the patient,  family and they are in agreement.  CODE STATUS: FULL CODE  TOTAL TIME TAKING CARE OF THIS PATIENT: 45 minutes.    Lewis Shock.D on 11/16/2014 at 9:26 PM  Between 7am to 6pm - Pager - [phone]  After 6pm go to [email or web address] - password [email or web address] [facility]  Office  [phone]  CC: Primary care physician; Alvester Morin, MD

## 2014-11-16 NOTE — ED Notes (Signed)
Lab called and informed that CBC ordered by Dr. Joni Fears will be cancelled by lab. Dr. Marcille Blanco is aware.

## 2014-11-16 NOTE — ED Notes (Signed)
Pt is resting on stretcher. Easily awakened. NAD.

## 2014-11-16 NOTE — Consult Note (Signed)
PATIENT NAME: [name]   MR#: [id number]  DATE OF BIRTH: June 08, 1956  DATE OF ADMISSION: 11/16/2014  PRIMARY CARE PHYSICIAN: Alvester Morin, MD   REQUESTING/REFERRING PHYSICIAN: Dr Assunta Gambles, MD  CHIEF COMPLAINT:    HISTORY OF PRESENT ILLNESS:  [name] is a 59 y.o. female with a known history of chronic hypotension whopresents to the emergency department after being unable to undergo dialysis this morning due to a clotted AV graft. The patient denies pain or fevers. She has not had any chest pain shortness of breath nausea vomiting or diarrhea.  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  She has had several episodes of thigh graft thrombosis in the past. PAST MEDICAL HISTORY:   Past Medical History  Diagnosis Date  . Renal insufficiency   . Mental retardation   . Sleep apnea   . End-stage renal disease on hemodialysis   . Tuberous sclerosis   . Obesity   . GERD (gastroesophageal reflux disease)   . Depression   . Mental retardation   . Sleep apnea     on c-pap  . Hx MRSA infection   . Obesity   . Complication of anesthesia     "sometimes I don't wake up on time; depends on how much med"  . Asthma   . 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 Osteoarthritis   . Angina   . CHF (congestive heart failure)   . Shortness of breath     "all the time"  . Pneumonia ? 2006  . Renal failure   . ESRD (end stage renal disease) on dialysis 05/25/11    Tu, Th, Sa  . Blood transfusion   . Anemia   . Stomach ulcer   . Constipated   . Seizures     last seizure 05/21/11  . Pacemaker     AV paced    PAST SURGICAL HISTOIRY:   Past Surgical History  Procedure Laterality Date  . Uterine tumor  05/25/11    pt denies this history  . Nephrectomy  01/08/1994; 01/15/2006     left; right  . Shunt in arm for dialysis      right   . Thrombectomy  05/2010    shunt in my right arm  . Cholecystectomy  07/02/1990  . Tonsillectomy  03/03/1978  . Avgg removal  06-26-11    Right arm AVG removed.  . Pacemaker placement    . Vas ins tunn cv cath 5 or older ([facility] hx)      both legs  . Av fistula placement      L  . Arteriovenous graft placement      R arm    SOCIAL HISTORY:   History  Substance Use Topics  . Smoking status: Never Smoker   . Smokeless tobacco: Never Used  . Alcohol Use: No    FAMILY HISTORY:   Family History  Problem Relation Age of Onset  . Diabetes Father   . Asthma Son     DRUG ALLERGIES:   Allergies  Allergen Reactions  . Iodinated Diagnostic Agents Hives  . Iodine     Other reaction(s): Other (See Comments) Other Reaction: contrast-itching and flushing  . Methylprednisolone Hives  . Other Itching    Other reaction(s): Other (See Comments) Uncoded Allergy. Allergen: iv contrast dye, Other Reaction: flushing Arthritis medications Other reaction(s): Other (See Comments) Uncoded Allergy. Allergen: iv contrast dye, Other Reaction:  flushing Arthritis medications  . Pollen Extract Hives  . Trimethadione     Other reaction(s): Other (See Comments) Other Reaction: Other reaction  . Trazodone And Nefazodone Hives and Rash    REVIEW OF SYSTEMS:  CONSTITUTIONAL: No fever, fatigue or weakness.  EYES: No blurred or double vision.  EARS, NOSE, AND THROAT: No tinnitus or ear pain.  RESPIRATORY: No cough, shortness of breath, wheezing or hemoptysis.  CARDIOVASCULAR: No chest pain, orthopnea, edema.  GASTROINTESTINAL: No nausea, vomiting, diarrhea or abdominal pain.  GENITOURINARY: No dysuria, hematuria.  ENDOCRINE: No polyuria, nocturia,  HEMATOLOGY: No anemia, easy bruising or  bleeding SKIN: No rash or lesion. MUSCULOSKELETAL: No joint pain or arthritis.  NEUROLOGIC: No tingling, numbness, weakness.  PSYCHIATRY: +  anxiety denies depression  MEDICATIONS AT HOME:   Prior to Admission medications   Medication Sig Start Date End Date Taking? Authorizing Provider  acetaminophen (TYLENOL) 325 MG tablet Take 650 mg by mouth every 6 (six) hours as needed for mild pain.    Historical Provider, MD  ALPRAZolam Duanne Moron) 0.25 MG tablet Take 1 mg by mouth See admin instructions. Takes pre-dialysis on Tuesday, Thursday, and Saturday    Historical Provider, MD  ALPRAZolam Duanne Moron) 0.5 MG tablet  10/14/14   Historical Provider, MD  antiseptic oral rinse (BIOTENE) LIQD 10 mLs by Mouth Rinse route as needed for dry mouth.    Historical Provider, MD  bisacodyl (DULCOLAX) 5 MG EC tablet Take 2 tablets (10 mg total) by mouth daily as needed for moderate constipation. 09/14/14   Allie Bossier, MD  calcitRIOL (ROCALTROL) 0.25 MCG capsule Take 1 capsule (0.25 mcg total) by mouth Every Tuesday,Thursday,and Saturday with dialysis. 09/14/14   Allie Bossier, MD  calcium acetate (PHOSLO) 667 MG capsule Take 2,001 mg by mouth 3 (three) times daily.     Historical Provider, MD  Darbepoetin Alfa (ARANESP) 200 MCG/0.4ML SOSY injection Inject 0.4 mLs (200 mcg total) into the vein every Tuesday with hemodialysis. 09/14/14   Allie Bossier, MD  ferric gluconate 125 mg in sodium chloride 0.9 % 100 mL Inject 125 mg into the vein every Tuesday with hemodialysis. 09/14/14   Allie Bossier, MD  fexofenadine (ALLEGRA) 180 MG tablet Take 180 mg by mouth daily. For allergies    Historical Provider, MD  fludrocortisone (FLORINEF) 0.1 MG tablet  08/27/14   Historical Provider, MD  gabapentin (NEURONTIN) 100 MG capsule Take 100 mg by mouth 2 (two) times daily.    Historical Provider, MD  hydrocortisone (ANUSOL-HC) 25 MG suppository  Place 1 suppository (25 mg total) rectally 2 (two) times daily. 09/14/14   Allie Bossier, MD  hydrocortisone (CORTEF) 10 MG tablet  09/17/14   Historical Provider, MD  hydrocortisone (CORTEF) 5 MG tablet  10/22/14   Historical Provider, MD  lamoTRIgine (LAMICTAL) 25 MG tablet Take 50 mg by mouth at bedtime.  05/07/14   Historical Provider, MD  levETIRAcetam (KEPPRA) 1000 MG tablet Sun/Mon/Wen/Fri 09/14/14   Allie Bossier, MD  levETIRAcetam (KEPPRA) 250 MG tablet  08/27/14   Historical Provider, MD  levETIRAcetam (KEPPRA) 750 MG tablet Tue/Th/Sat 09/14/14   Allie Bossier, MD  levothyroxine (SYNTHROID, LEVOTHROID) 50 MCG tablet Take 1 tablet (50 mcg total) by mouth daily before breakfast. 09/14/14   Allie Bossier, MD  metroNIDAZOLE (METROCREAM) 0.75 % cream Apply vaginally if pt is still symptomatic 11/12/14   Historical Provider, MD  midodrine (PROAMATINE) 10 MG tablet Take 2 tablets (20 mg total) by mouth 3 (  three) times daily. 09/14/14   Allie Bossier, MD  pantoprazole (PROTONIX) 40 MG tablet Take 1 tablet (40 mg total) by mouth 2 (two) times daily. 09/14/14 09/14/15  Allie Bossier, MD  polyethylene glycol [facility] / Floria Raveling) packet Take 17 g by mouth 2 (two) times daily.    Historical Provider, MD  Propylene Glycol 0.6 % SOLN Apply 1 drop to eye 2 (two) times daily.    Historical Provider, MD  RENVELA 800 MG tablet  08/27/14   Historical Provider, MD  RESTASIS 0.05 % ophthalmic emulsion Place 1 drop into both eyes 2 (two) times daily.  06/01/14   Historical Provider, MD  risperiDONE (RISPERDAL) 0.5 MG tablet Take 0.5 mg by mouth at bedtime.  06/04/14   Historical Provider, MD  risperiDONE microspheres (RISPERDAL CONSTA) 37.5 MG injection Inject 37.5 mg into the muscle every 14 (fourteen) days.    Historical Provider, MD  senna-docusate (SENOKOT-S) 8.6-50 MG per tablet Take 2 tablets by mouth 2 (two) times  daily.     Historical Provider, MD  sertraline (ZOLOFT) 100 MG tablet Take 100 mg by mouth daily. Take with 50mg  tablet to equal 150mg     Historical Provider, MD  sertraline (ZOLOFT) 50 MG tablet Take 50 mg by mouth daily. Take with 100mg  tablet to equal 150mg     Historical Provider, MD  sevelamer (RENAGEL) 800 MG tablet Take 1,600 mg by mouth 3 (three) times daily with meals.     Historical Provider, MD  sevelamer (RENAGEL) 800 MG tablet Take 1 tablet by mouth daily.    Historical Provider, MD  sodium chloride (OCEAN) 0.65 % SOLN nasal spray Place 1 spray into both nostrils every 8 (eight) hours as needed for congestion.    Historical Provider, MD  SYMBICORT 160-4.5 MCG/ACT inhaler Inhale 2 puffs into the lungs 2 (two) times daily.  05/25/14   Historical Provider, MD  topiramate (TOPAMAX) 200 MG tablet Take 200 mg by mouth 2 (two) times daily.     Historical Provider, MD  traMADol (ULTRAM) 50 MG tablet Take 50 mg by mouth every 8 (eight) hours as needed (pain).     Historical Provider, MD  triamcinolone (KENALOG) 0.025 % cream Apply to affected area 09/15/14   Historical Provider, MD  zolpidem (AMBIEN) 5 MG tablet Take 1 tablet (5 mg total) by mouth at bedtime as needed for sleep. 09/14/14   Allie Bossier, MD     VITAL SIGNS:  Blood pressure 88/54, pulse 59, temperature 97.4 F (36.3 C), temperature source Oral, resp. rate 9, height 5\' 6"  (1.676 m), weight 146.7 kg (323 lb 6.6 oz), SpO2 99 %.  PHYSICAL EXAMINATION:  GENERAL: 59 y.o.-year-old patient lying in the bed with no acute distress.  EYES: Pupils equal, round, reactive to light and accommodation. No scleral icterus. Extraocular muscles intact.  HEENT: Head atraumatic, normocephalic. Oropharynx and nasopharynx clear.  NECK: Supple, no jugular venous distention. No thyroid enlargement, no tenderness.  LUNGS: Normal breath sounds bilaterally, no wheezing,  rales,rhonchi or crepitation. No use of accessory muscles of respiration.  CARDIOVASCULAR: S1, S2 normal. No murmurs, rubs, or gallops.  ABDOMEN: Soft, nontender, nondistended. Bowel sounds present. No organomegaly or mass.  EXTREMITIES: right thigh av graft no thrill no bruit No pedal edema, cyanosis, or clubbing. Multiple clotted access noted bilateral arms  NEUROLOGIC: Cranial nerves II through XII are intact. Muscle strength 5/5 in all extremities. Sensation intact. Gait not checked.  PSYCHIATRIC: The patient is alert and oriented x 3.  SKIN:  No obvious rash,neurofibromatosis nodules noted diffusely or ulcer.   LABORATORY PANEL:   CBC  Last Labs      Recent Labs Lab 11/16/14 1811  WBC 3.7  HGB 10.8*  HCT 33.2*  PLT 105*     ------------------------------------------------------------------------------------------------------------------  Chemistries   Last Labs      Recent Labs Lab 11/16/14 1503  NA 137  K 6.8*  CL 100*  CO2 23  GLUCOSE 97  BUN 77*  CREATININE 9.12*  CALCIUM 8.9  AST 25  ALT 14  ALKPHOS 303*  BILITOT 1.4*     ------------------------------------------------------------------------------------------------------------------  Cardiac Enzymes  Last Labs     No results for input(s): TROPONINI in the last 168 hours.   ------------------------------------------------------------------------------------------------------------------  RADIOLOGY:   Imaging Results (Last 48 hours)    No results found.    EKG:   Orders placed or performed during the hospital encounter of 11/16/14  . ED EKG  . ED EKG  . EKG 12-Lead  . EKG 12-Lead    IMPRESSION AND PLAN:   59 year old female admitted for AV graft declotting.  1. AV graft: is thrombosed will plan thrombectomy in the morning. 2. ESRD: consult nephrology for dialysis (perhaps Wednesday after clot lysis). Cont Renvela.  Potassium is elevated due to missed dialysis. We'll shift intracellularly potassium concentration with insulin and Kayexalate. I have also given calcium gluconate. There are no EKG changes.  3. Epilepsy: continue Keppra, Lamictal, and Topamax per home regimen 4. Hypotension: chronic problem. Cont midodrine, Florinef, and hydrocortisone  5. Hypothyroidism: continue Synthroid 6. Depression: cont Zoloft and risperidone 7. Asthma/COPD: cont Symbicort (clarify diagnosis)    Management plans discussed with the patient, family and they are in agreement.  CODE STATUS: FULL CODE  Katha Cabal, MD

## 2014-11-16 NOTE — Progress Notes (Signed)
meds delayed due to pharmacy

## 2014-11-16 NOTE — ED Provider Notes (Signed)
[facility] Emergency Department Provider Note    ____________________________________________  Time seen: 1510  I have reviewed the triage vital signs and the nursing notes.   HISTORY  Chief Complaint Vascular Access Problem  HPI [name] is a 59 y.o. female with history of end-stage renal disease on dialysis who presents today because of concerns that right thigh graft has clotted. Patient last received her full course of dialysis on Saturday. Additionally the patient states that she is started feeling fatigued since returning back to [facility] from dialysis this morning. Per paperwork patient received her last Benadryl dose this morning and has not had her afternoon dose. Patient denies any fevers, chest pain or shortness of breath.   Past Medical History  Diagnosis Date  . Renal insufficiency   . Mental retardation   . Sleep apnea   . End-stage renal disease on hemodialysis   . Tuberous sclerosis   . Obesity   . GERD (gastroesophageal reflux disease)   . Depression   . Mental retardation   . Sleep apnea     on c-pap  . Hx MRSA infection   . Obesity   . Complication of anesthesia     "sometimes I don't wake up on time; depends on how much med"  . Asthma   . 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 Osteoarthritis   . Angina   . CHF (congestive heart failure)   . Shortness of breath     "all the time"  . Pneumonia ? 2006  . Renal failure   . ESRD (end stage renal disease) on dialysis 05/25/11    Tu, Th, Sa  . Blood transfusion   . Anemia   . Stomach ulcer   . Constipated   . Seizures     last seizure 05/21/11  . Pacemaker     AV paced    Patient Active Problem List   Diagnosis Date Noted  . Lower GI bleeding   . Bradycardia   . Abnormal serum level of alkaline phosphatase   . Bleeding gastrointestinal   . Other specified hypothyroidism   . Symptomatic bradycardia   . Arterial hypotension   . Lower GI bleed   . Hemorrhoid   . Acute blood loss  anemia   . Syncope and collapse   . Other specified hypotension   . End-stage renal disease on hemodialysis   . GI bleed 09/08/2014  . Sepsis with hypotension 09/08/2014  . Sepsis 09/08/2014  . Bright red rectal bleeding 09/08/2014  . End stage renal disease on dialysis 09/08/2014  . Seizures 09/08/2014  . Frequent falls 09/08/2014  . Elevated alkaline phosphatase level 09/08/2014  . Follow-up examination following surgery 07/12/2011  . Displacement of vascular graft 06/22/2011  . Fall with injury 05/25/2011  . HYPERKALEMIA 01/24/2010  . LUNG NODULE 09/06/2009  . SHORTNESS OF BREATH (SOB) 06/03/2009  . CONSTIPATION, RECURRENT 03/16/2009  . SLEEP APNEA, OBSTRUCTIVE 12/16/2008  . External thrombosed hemorrhoids 12/16/2008  . DEPRESSION, CHRONIC, SEVERE 04/21/2008  . SKIN TAG 02/25/2008  . GEN OSTEOARTHROSIS INVOLVING MULTIPLE SITES 01/08/2008  . NEVUS, ATYPICAL 12/15/2007  . ALLERGIC RHINITIS, CHRONIC 10/27/2007  . CONTACT DERMATITIS&OTHER ECZEMA DUE DETERGENTS 08/22/2007  . TINEA CORPORIS 08/01/2007  . GERD 06/30/2007  . CONSTIPATION, SLOW TRANSIT 04/14/2007  . RENAL DISEASE, END STAGE 01/22/2007  . OBESITY, MORBID 01/07/2007  . MENTAL RETARDATION 01/07/2007  . EPILEPSY, W/COMPLEX SEZR W/O INTRCT EPILEPSY 01/07/2007  . ASTHMA 01/07/2007  . Tuberous sclerosis 01/07/2007  Past Surgical History  Procedure Laterality Date  . Uterine tumor  05/25/11    pt denies this history  . Nephrectomy  01/08/1994; 01/15/2006    left; right  . Shunt in arm for dialysis      right   . Thrombectomy  05/2010    shunt in my right arm  . Cholecystectomy  07/02/1990  . Tonsillectomy  03/03/1978  . Avgg removal  06-26-11    Right arm AVG removed.    Current Outpatient Rx  Name  Route  Sig  Dispense  Refill  . acetaminophen (TYLENOL) 325 MG tablet   Oral   Take 650 mg by mouth every 6 (six) hours as needed for mild pain.         Marland Kitchen ALPRAZolam (XANAX) 0.25 MG tablet   Oral   Take 1 mg  by mouth See admin instructions. Takes pre-dialysis on Tuesday, Thursday, and Saturday         . antiseptic oral rinse (BIOTENE) LIQD   Mouth Rinse   10 mLs by Mouth Rinse route as needed for dry mouth.         . bisacodyl (DULCOLAX) 5 MG EC tablet   Oral   Take 2 tablets (10 mg total) by mouth daily as needed for moderate constipation.   30 tablet   0   . calcitRIOL (ROCALTROL) 0.25 MCG capsule   Oral   Take 1 capsule (0.25 mcg total) by mouth Every Tuesday,Thursday,and Saturday with dialysis.   30 capsule   0   . calcium acetate (PHOSLO) 667 MG capsule   Oral   Take 2,001 mg by mouth 3 (three) times daily.          . Darbepoetin Alfa (ARANESP) 200 MCG/0.4ML SOSY injection   Intravenous   Inject 0.4 mLs (200 mcg total) into the vein every Tuesday with hemodialysis.   1.68 mL   0   . ferric gluconate 125 mg in sodium chloride 0.9 % 100 mL   Intravenous   Inject 125 mg into the vein every Tuesday with hemodialysis.   125 mg   0   . fexofenadine (ALLEGRA) 180 MG tablet   Oral   Take 180 mg by mouth daily. For allergies         . gabapentin (NEURONTIN) 100 MG capsule   Oral   Take 100 mg by mouth 2 (two) times daily.         . hydrocortisone (ANUSOL-HC) 25 MG suppository   Rectal   Place 1 suppository (25 mg total) rectally 2 (two) times daily.   12 suppository   0   . lamoTRIgine (LAMICTAL) 25 MG tablet   Oral   Take 50 mg by mouth at bedtime.          . levETIRAcetam (KEPPRA) 1000 MG tablet      Sun/Mon/Wen/Fri   30 tablet   0   . levETIRAcetam (KEPPRA) 750 MG tablet      Tue/Th/Sat   60 tablet   0   . levothyroxine (SYNTHROID, LEVOTHROID) 50 MCG tablet   Oral   Take 1 tablet (50 mcg total) by mouth daily before breakfast.   30 tablet   0   . midodrine (PROAMATINE) 10 MG tablet   Oral   Take 2 tablets (20 mg total) by mouth 3 (three) times daily.   180 tablet   0   . pantoprazole (PROTONIX) 40 MG tablet   Oral   Take 1 tablet  (  40 mg total) by mouth 2 (two) times daily.   30 tablet   6   . polyethylene glycol (MIRALAX / GLYCOLAX) packet   Oral   Take 17 g by mouth 2 (two) times daily.         Marland Kitchen Propylene Glycol 0.6 % SOLN   Ophthalmic   Apply 1 drop to eye 2 (two) times daily.         . RESTASIS 0.05 % ophthalmic emulsion   Both Eyes   Place 1 drop into both eyes 2 (two) times daily.          . risperiDONE (RISPERDAL) 0.5 MG tablet   Oral   Take 0.5 mg by mouth at bedtime.          . risperiDONE microspheres (RISPERDAL CONSTA) 37.5 MG injection   Intramuscular   Inject 37.5 mg into the muscle every 14 (fourteen) days.         Marland Kitchen senna-docusate (SENOKOT-S) 8.6-50 MG per tablet   Oral   Take 2 tablets by mouth 2 (two) times daily.          . sertraline (ZOLOFT) 100 MG tablet   Oral   Take 100 mg by mouth daily. Take with 50mg  tablet to equal 150mg          . sertraline (ZOLOFT) 50 MG tablet   Oral   Take 50 mg by mouth daily. Take with 100mg  tablet to equal 150mg          . sevelamer (RENAGEL) 800 MG tablet   Oral   Take 1,600 mg by mouth 3 (three) times daily with meals.          . sodium chloride (OCEAN) 0.65 % SOLN nasal spray   Each Nare   Place 1 spray into both nostrils every 8 (eight) hours as needed for congestion.         . SYMBICORT 160-4.5 MCG/ACT inhaler   Inhalation   Inhale 2 puffs into the lungs 2 (two) times daily.          Marland Kitchen topiramate (TOPAMAX) 200 MG tablet   Oral   Take 200 mg by mouth 2 (two) times daily.           . traMADol (ULTRAM) 50 MG tablet   Oral   Take 50 mg by mouth every 8 (eight) hours as needed (pain).          Marland Kitchen zolpidem (AMBIEN) 5 MG tablet   Oral   Take 1 tablet (5 mg total) by mouth at bedtime as needed for sleep.   30 tablet   0     Allergies Iodinated diagnostic agents; Iodine; Methylprednisolone; Other; Pollen extract; Trimethadione; and Trazodone and nefazodone  Family History  Problem Relation Age of Onset   . Diabetes Father   . Asthma Son     Social History History  Substance Use Topics  . Smoking status: Never Smoker   . Smokeless tobacco: Never Used  . Alcohol Use: No    Review of Systems Constitutional: Negative for fever. Cardiovascular: Negative for chest pain. Respiratory: Negative for shortness of breath. Gastrointestinal: Negative for abdominal pain, vomiting and diarrhea. Genitourinary: Negative for dysuria. Musculoskeletal: Negative for back pain. Skin: Negative for rash. Neurological: Negative for headaches, focal or numbness.   10-point ROS otherwise negative.  ____________________________________________   PHYSICAL EXAM:  VITAL SIGNS: ED Triage Vitals  Enc Vitals Group     BP 11/16/14 1032 89/54 mmHg     Pulse  Rate 11/16/14 1026 60     Resp 11/16/14 1026 20     Temp 11/16/14 1026 97.8 F (36.6 C)     Temp Source 11/16/14 1026 Oral     SpO2 11/16/14 1026 100 %     Weight 11/16/14 1026 316 lb (143.337 kg)     Height 11/16/14 1026 5\' 6"  (1.676 m)     Head Cir --      Peak Flow --      Pain Score 11/16/14 1027 0     Pain Loc --      Pain Edu? --      Excl. in [place]? --    Constitutional: Alert and oriented. Well appearing and in no distress. Eyes: Conjunctivae are normal. PERRL. Normal extraocular movements. ENT   Head: Normocephalic and atraumatic.   Nose: No congestion/rhinnorhea.   Mouth/Throat: Mucous membranes are moist.   Neck: No stridor. Hematological/Lymphatic/Immunilogical: No cervical lymphadenopathy. Cardiovascular: Normal rate, regular rhythm. No murmurs, rubs, or gallops. Respiratory: Normal respiratory effort without tachypnea nor retractions. Breath sounds are clear and equal bilaterally. No wheezes/rales/rhonchi. Gastrointestinal: Soft and nontender. No distention.   Musculoskeletal: Nontender. Right thigh fistula without apparent thrill, no bruit heard. Neurologic:  Normal speech and language. No gross focal neurologic  are appreciated. Speech is normal.  Skin:  Skin is warm, dry and intact. No rash noted. Psychiatric: Mood and affect are normal. Speech and behavior are normal. Patient exhibits appropriate insight and judgment.  ____________________________________________    LABS (pertinent positives/negatives)  Pending   ____________________________________________   EKG  ED ECG REPORT   Date: 11/16/2014  EKG Time: 1554  Rate: 93  Rhythm: Atrial paced rhytm  Axis: right axis  Intervals:paced rhythm  ST&T Change: None  ____________________________________________    RADIOLOGY  None  ____________________________________________   PROCEDURES  Procedure(s) performed: None  Critical Care performed: No  ____________________________________________   INITIAL IMPRESSION / ASSESSMENT AND PLAN / ED COURSE  Pertinent labs & imaging results that were available during my care of the patient were reviewed by me and considered in my medical decision making (see chart for details).  Patient presents to emergency department with concern that AV shunt has clotted. Patient was not able to undergo dialysis today. On exam no thrill or bruit appreciated. Plan will be to admit for fistulogram. Additionally patient is hypotensive. Appears to be chronically hypotensive and did not receive afternoon dose of midodrone. Will give midodrone.    ____________________________________________   FINAL CLINICAL IMPRESSION(S) / ED DIAGNOSES  Final diagnoses:  Hypotension, unspecified hypotension type  AV fistula occlusion, initial encounter     Nance Pear, MD 11/16/14 1607

## 2014-11-16 NOTE — ED Notes (Signed)
PT could not tell me any of the name if her medications. Even with reading off of the screen. I tried calling pharmacy but she hadn't had anything filled there since 2013.

## 2014-11-17 ENCOUNTER — Encounter
Admission: EM | Disposition: A | Discharge status: Skilled Nursing Facility | Payer: Medicare Other | Source: Home / Self Care | Attending: Internal Medicine

## 2014-11-17 HISTORY — PX: PERIPHERAL VASCULAR CATHETERIZATION: SHX172C

## 2014-11-17 LAB — BASIC METABOLIC PANEL
Anion gap: 14 (ref 5–15)
BUN: 81 mg/dL — ABNORMAL HIGH (ref 6–20)
CO2: 26 mmol/L (ref 22–32)
Calcium: 8.9 mg/dL (ref 8.9–10.3)
Chloride: 97 mmol/L — ABNORMAL LOW (ref 101–111)
Creatinine, Ser: 10.07 mg/dL — ABNORMAL HIGH (ref 0.44–1.00)
GFR calc Af Amer: 4 mL/min — ABNORMAL LOW (ref >60)
GFR calc non Af Amer: 4 mL/min — ABNORMAL LOW (ref >60)
Glucose, Bld: 93 mg/dL (ref 65–99)
Potassium: 5.8 mmol/L — ABNORMAL HIGH (ref 3.5–5.1)
Sodium: 137 mmol/L (ref 135–145)

## 2014-11-17 LAB — CBC
HCT: 33.5 % — ABNORMAL LOW (ref 35.0–47.0)
Hemoglobin: 10.7 g/dL — ABNORMAL LOW (ref 12.0–16.0)
MCH: 33.3 pg (ref 26.0–34.0)
MCHC: 31.8 g/dL — ABNORMAL LOW (ref 32.0–36.0)
MCV: 104.6 fL — ABNORMAL HIGH (ref 80.0–100.0)
Platelets: 108 10*3/uL — ABNORMAL LOW (ref 150–440)
RBC: 3.2 MIL/uL — ABNORMAL LOW (ref 3.80–5.20)
RDW: 20.1 % — ABNORMAL HIGH (ref 11.5–14.5)
WBC: 3.6 10*3/uL (ref 3.6–11.0)

## 2014-11-17 LAB — MAGNESIUM: Magnesium: 2.4 mg/dL (ref 1.7–2.4)

## 2014-11-17 LAB — PHOSPHORUS: Phosphorus: 5.6 mg/dL — ABNORMAL HIGH (ref 2.5–4.6)

## 2014-11-17 SURGERY — A/V SHUNTOGRAM/FISTULAGRAM
Anesthesia: Moderate Sedation

## 2014-11-17 SURGERY — DIALYSIS/PERMA CATHETER INSERTION
Laterality: Left

## 2014-11-17 MED ORDER — SODIUM CHLORIDE 0.9 % IV SOLN: 100.0000 mL | INTRAVENOUS | Status: DC | PRN

## 2014-11-17 MED ORDER — SODIUM CHLORIDE 0.9 % IV SOLN
INTRAVENOUS | Status: DC
  Administered 2014-11-17: 09:00:00 via INTRAVENOUS

## 2014-11-17 MED ORDER — HYDROCORTISONE NICU INJ SYRINGE 50 MG/ML: 100.0000 mg | Freq: Once | INTRAVENOUS | Status: DC

## 2014-11-17 MED ORDER — FENTANYL CITRATE (PF) 100 MCG/2ML IJ SOLN
25.0000 ug | Freq: Once | INTRAMUSCULAR | Status: AC
  Administered 2014-11-17: 25 ug via INTRAVENOUS

## 2014-11-17 MED ORDER — HYDROCORTISONE 2.5 % RE CREA
TOPICAL_CREAM | Freq: Two times a day (BID) | RECTAL | Status: DC
  Administered 2014-11-17 – 2014-11-20 (×6): via RECTAL
  Filled 2014-11-17: qty 28.35

## 2014-11-17 MED ORDER — LIDOCAINE HCL (PF) 1 % IJ SOLN
INTRAMUSCULAR | Status: AC
  Filled 2014-11-17: qty 10

## 2014-11-17 MED ORDER — OPTICHAMBER ADVANTAGE MISC
1.0000 | Status: DC
  Administered 2014-11-18 (×2): 1
  Filled 2014-11-17: qty 1

## 2014-11-17 MED ORDER — MIDAZOLAM HCL 5 MG/5ML IJ SOLN
INTRAMUSCULAR | Status: AC
  Filled 2014-11-17: qty 5

## 2014-11-17 MED ORDER — MIDAZOLAM HCL 2 MG/2ML IJ SOLN
INTRAMUSCULAR | Status: AC
  Filled 2014-11-17: qty 2

## 2014-11-17 MED ORDER — CEFAZOLIN SODIUM 1-5 GM-% IV SOLN
INTRAVENOUS | Status: AC
  Filled 2014-11-17: qty 50

## 2014-11-17 MED ORDER — MIDAZOLAM HCL 5 MG/ML IJ SOLN
0.5000 mg | Freq: Once | INTRAMUSCULAR | Status: AC
  Administered 2014-11-17: 0.5 mg via INTRAVENOUS

## 2014-11-17 MED ORDER — HEPARIN SODIUM (PORCINE) 1000 UNIT/ML DIALYSIS
50.0000 [IU]/kg | INTRAMUSCULAR | Status: DC | PRN
  Administered 2014-11-17: 7300 [IU] via INTRAVENOUS_CENTRAL
  Filled 2014-11-17 (×2): qty 8

## 2014-11-17 MED ORDER — HEPARIN SODIUM (PORCINE) 1000 UNIT/ML IJ SOLN
INTRAMUSCULAR | Status: AC
Start: 2014-11-17 — End: 2014-11-17
  Filled 2014-11-17: qty 1

## 2014-11-17 MED ORDER — HYDROCORTISONE 10 MG PO TABS: 100.0000 mg | ORAL_TABLET | Freq: Every day | ORAL | Status: DC

## 2014-11-17 MED ORDER — CEFAZOLIN SODIUM 1-5 GM-% IV SOLN
1.0000 g | INTRAVENOUS | Status: AC
  Administered 2014-11-17: 1 g via INTRAVENOUS
  Filled 2014-11-17: qty 50

## 2014-11-17 MED ORDER — FENTANYL CITRATE (PF) 100 MCG/2ML IJ SOLN
INTRAMUSCULAR | Status: AC
  Filled 2014-11-17: qty 2

## 2014-11-17 MED ORDER — HEPARIN (PORCINE) IN NACL 2-0.9 UNIT/ML-% IJ SOLN
INTRAMUSCULAR | Status: AC
  Filled 2014-11-17: qty 1000

## 2014-11-17 SURGICAL SUPPLY — 2 items
KIT DIALYSIS CATH TRI 30X13 (CATHETERS) ×2 IMPLANT
PACK ANGIOGRAPHY (CUSTOM PROCEDURE TRAY) ×2 IMPLANT

## 2014-11-17 NOTE — Progress Notes (Signed)
Patient s/p dialysis , no distress noted care ongoing endosed

## 2014-11-17 NOTE — Progress Notes (Signed)
md informed of pt. With hypotensive episodes x 2 hrs. Maps in 50's, non symptomatic, awakens easily, gets self up to bedside commode. No other signs/symptoms noted. Will continue to monitor.

## 2014-11-17 NOTE — Op Note (Signed)
  OPERATIVE NOTE   PROCEDURE: 1. Insertion of temporary dialysis catheter catheter left femoral approach.  PRE-OPERATIVE DIAGNOSIS: Malignant hyperkalemia, thrombosis right femoral AV graft, end-stage renal disease  POST-OPERATIVE DIAGNOSIS: Same  SURGEON: Katha Cabal M.D.  ANESTHESIA: 1% lidocaine local infiltration  ESTIMATED BLOOD LOSS: Minimal cc  INDICATIONS:   [name] is a 59 y.o. female who presents with thrombosis of her right thigh AV graft. She was noted to have a potassium greater than 5.5 on her a.m. labs and therefore could not undergo thrombectomy of her graft. Consequently, she is undergoing placement of a temporary dialysis catheter to allow for urgent dialysis and will have her graft declotted subsequently.  DESCRIPTION: After obtaining full informed written consent, the patient was positioned supine. The left thigh was prepped and draped in a sterile fashion. Ultrasound was placed in a sterile sleeve. Ultrasound was utilized to identify the left femoral vein which is noted to be echolucent and compressible indicating patency. Images recorded for the permanent record. Under real-time visualization a Seldinger needle is inserted into the vein and the guidewires advanced without difficulty. Small counterincision was made at the wire insertion site. Dilator is passed over the wire and the temporary dialysis catheter catheter is fed over the wire without difficulty.  All lumens aspirate and flush easily and are packed with heparin saline. Catheter secured to the skin of the thigh with 2-0 silk. A sterile dressing is applied with Biopatch.  COMPLICATIONS: None  CONDITION: Good  Katha Cabal, M.D. [facility]. Office:  [phone]

## 2014-11-17 NOTE — Progress Notes (Signed)
Pt arrived gcs 15, lungs clear to auscultation, right thigh graft no thrill/bruit, 24g cath right finger nsl, 20g cath left upper arm nsl.

## 2014-11-17 NOTE — Progress Notes (Signed)
Patient alert and oriented. Paced in monitor. Vitals stable in 39'P systolic.  Dr. Vianne Bulls made aware that Dr. Carroll Kinds unable to declot patients access for dialysis- temporary trialysis placed in left groin.  No complaints of pain.  Pt in dialysis at this time and is to transfer to room 240.

## 2014-11-17 NOTE — Progress Notes (Signed)
Pt has had several loose stools, pt up to bedside commode with minimal assistance. Pt had several episodes of hypotension, md aware and continue to monitor as pt with chronic hypotension, and non symptomatic, pt denies sob, cp, or any discomfort throughout night. Pt has been npo since midnight for fistulagram this am. No other changes noted throughout night. Pt slept other than to use commode.

## 2014-11-17 NOTE — Progress Notes (Signed)
[facility]  ROUNDING NOTE   Subjective:   Patient admitted to [facility] on 5/3.  Patient was unable to be access through AVG. She was sent to ED where she was found to have a clotted AVG. Angiogram was scheduled for this morning for declot but found to have hyperkalemia. 5.8.  Catheter placed left femoral temp HD for dialysis later today.   Objective:  Vital signs in last 24 hours:  Temp:  [97.4 F (36.3 C)-97.5 F (36.4 C)] 97.5 F (36.4 C) (05/04 0700) Pulse Rate:  [54-71] 71 (05/04 0953) Resp:  [8-27] 24 (05/04 1007) BP: (63-134)/(35-114) 86/38 mmHg (05/04 1007) SpO2:  [94 %-100 %] 97 % (05/04 1007) Weight:  [146.7 kg (323 lb 6.6 oz)] 146.7 kg (323 lb 6.6 oz) (05/03 2000)  Weight change:  Filed Weights   11/16/14 1026 11/16/14 2000  Weight: 143.337 kg (316 lb) 146.7 kg (323 lb 6.6 oz)    Intake/Output: I/O last 3 completed shifts: In: -  Out: 2 [Stool:2]   Intake/Output this shift:     Physical Exam: General: NAD,   Head: Normocephalic, atraumatic. Moist oral mucosal membranes  Eyes: Anicteric, PERRL  Neck: Supple, trachea midline  Lungs:  Clear to auscultation  Heart: Regular rate and rhythm  Abdomen:  Soft, nontender,   Extremities: no peripheral edema.  Neurologic: Nonfocal, moving all four extremities  Skin: No lesions  Access: Right femoral thigh AVG with no bruit and no thrill, left femoral temp HD catheter 5/3 Dr. Delana Meyer    Basic Metabolic Panel:  Recent Labs Lab 11/16/14 1503 11/17/14 0513  NA 137 137  K 6.8* 5.8*  CL 100* 97*  CO2 23 26  GLUCOSE 97 93  BUN 77* 81*  CREATININE 9.12* 10.07*  CALCIUM 8.9 8.9  MG  --  2.4  PHOS  --  5.6*    Liver Function Tests:  Recent Labs Lab 11/16/14 1503  AST 25  ALT 14  ALKPHOS 303*  BILITOT 1.4*  PROT 7.0  ALBUMIN 3.7   No results for input(s): LIPASE, AMYLASE in the last 168 hours. No results for input(s): AMMONIA in the last 168 hours.  CBC:  Recent Labs Lab  11/16/14 1811 11/17/14 0513  WBC 3.7 3.6  NEUTROABS 2.3  --   HGB 10.8* 10.7*  HCT 33.2* 33.5*  MCV 104.8* 104.6*  PLT 105* 108*    Cardiac Enzymes: No results for input(s): CKTOTAL, CKMB, CKMBINDEX, TROPONINI in the last 168 hours.  BNP: Invalid input(s): POCBNP  CBG: No results for input(s): GLUCAP in the last 168 hours.  Microbiology: Results for orders placed or performed during the hospital encounter of 10/19/14  Culture, blood (single)     Status: None   Collection Time: 10/19/14  5:09 PM  Result Value Ref Range Status   Micro Text Report   Final       COMMENT                   NO GROWTH AEROBICALLY/ANAEROBICALLY IN 5 DAYS   ANTIBIOTIC                                                      Culture, blood (single)     Status: None   Collection Time: 10/19/14  5:09 PM  Result Value Ref Range Status  Micro Text Report   Final       COMMENT                   NO GROWTH AEROBICALLY/ANAEROBICALLY IN 5 DAYS   ANTIBIOTIC                                                        Coagulation Studies: No results for input(s): LABPROT, INR in the last 72 hours.  Urinalysis: No results for input(s): COLORURINE, LABSPEC, PHURINE, GLUCOSEU, HGBUR, BILIRUBINUR, KETONESUR, PROTEINUR, UROBILINOGEN, NITRITE, LEUKOCYTESUR in the last 72 hours.  Invalid input(s): APPERANCEUR    Imaging: No results found.   Medications:   . sodium chloride 500 mL/hr at 11/17/14 0830   . budesonide-formoterol  2 puff Inhalation BID  . [START ON 11/18/2014] calcitRIOL  0.25 mcg Oral Q T,Th,Sa-HD  . calcium acetate  2,001 mg Oral TID WC  . cycloSPORINE  1 drop Both Eyes BID  . docusate sodium  100 mg Oral BID  . [START ON 11/23/2014] ferric gluconate (FERRLECIT/NULECIT) IV  125 mg Intravenous Q Tue-HD  . fludrocortisone  0.1 mg Oral Daily  . gabapentin  100 mg Oral BID  . heparin  5,000 Units Subcutaneous 3 times per day  . hydrocortisone  25 mg Rectal BID  . lamoTRIgine  50 mg Oral QHS   . levothyroxine  50 mcg Oral QAC breakfast  . loratadine  10 mg Oral Daily  . metroNIDAZOLE   Topical BID  . midodrine  20 mg Oral TID  . OPTICHAMBER ADVANTAGE  1 each Other UD  . polyethylene glycol  17 g Oral BID  . risperiDONE  0.5 mg Oral QHS  . risperiDONE microspheres  37.5 mg Intramuscular Q14 Days  . sertraline  100 mg Oral Daily  . sertraline  50 mg Oral Daily  . sevelamer carbonate  800 mg Oral TID WC  . sevelamer carbonate  800 mg Oral TID WC  . topiramate  200 mg Oral BID  . triamcinolone   Topical BID   sodium chloride, sodium chloride, acetaminophen, ALPRAZolam, bisacodyl, heparin, levETIRAcetam, levETIRAcetam, ondansetron **OR** ondansetron (ZOFRAN) IV, sodium chloride, traMADol, zolpidem  Assessment/ Plan:  59 y.o. female  Ms. Renfrow is a 59 y.o. white female with ESRD on hemodialysis TTS, bilateral nephrectomies, seizure disorder, morbid obesity, tubuerous sclerosis, hypotension, depression, asthma and secondary hyperparathyroidism, seizure disorder admit 4/19, OSA on CPAP  [facility]//[facility]// TTS  1.  ESRD: missed dialysis treatment yesterday due to thrombosed AVG. Temp cath placed.  Dialysis for later today.  Potassium elevated at 5.8, was shifted last night with calcium gluconate and kayexalate given from K of 6.8. - Angiogram for Friday from thrombectomy  2.  Hypotension: I95.9 chronically with low BP. asymptomatic.  - Currently on fludrcortisone, hydrocortisone, and midodrine.  - previously also on pseudophedrine Fludrocortisone is not effective in end stage renal disease and with bilateral nephrectomies Hydrocortisone is suppository??  3.  Anemia of Kidney Disease: hemoglobin 10.7.  - epogen with hemodialysis. Patient getting micera as outpatient.   - monitor chronic but stable thrombocytopenia.   4.  SHPTH: phos 5.6 - renvela with meals.  - calcitriol    LOS: 1 [name] 5/4/201612:38 PM

## 2014-11-17 NOTE — Progress Notes (Signed)
[place] [facility] [place] NAME: [name]    MR#:  [id number]  DATE OF BIRTH:  August 09, 1955  SUBJECTIVE:Admitted for clotted AV graft.Hypotensive and hyperkalemic on admission.so admitted to [place] still present.but no chest pain  Or sob.HD sccess could not be un clotted,so she has new permacth placed.will go  For HD c/o hemorrhoid pain,requesting suppository.  CHIEF COMPLAINT:   Chief Complaint  Patient presents with  . Vascular Access Problem    REVIEW OF SYSTEMS:  CONSTITUTIONAL: No fever, fatigue or weakness.  EYES: No blurred or double vision.  EARS, NOSE, AND THROAT: No tinnitus or ear pain.  RESPIRATORY: No cough, shortness of breath, wheezing or hemoptysis.  CARDIOVASCULAR: No chest pain, orthopnea, edema.  GASTROINTESTINAL: No nausea, vomiting, diarrhea or abdominal pain.  GENITOURINARY: No dysuria, hematuria.  ENDOCRINE: No polyuria, nocturia,  HEMATOLOGY: No anemia, easy bruising or bleeding SKIN: No rash or lesion. MUSCULOSKELETAL: No joint pain or arthritis.   NEUROLOGIC: No tingling, numbness, weakness.  PSYCHIATRY: No anxiety or depression.   DRUG ALLERGIES:   Allergies  Allergen Reactions  . Iodinated Diagnostic Agents Hives  . Iodine     Other reaction(s): Other (See Comments) Other Reaction: contrast-itching and flushing  . Methylprednisolone Hives  . Other Itching    Other reaction(s): Other (See Comments) Uncoded Allergy. Allergen: iv contrast dye, Other Reaction: flushing Arthritis medications Other reaction(s): Other (See Comments) Uncoded Allergy. Allergen: iv contrast dye, Other Reaction: flushing Arthritis medications  . Pollen Extract Hives  . Trimethadione     Other reaction(s): Other (See Comments) Other Reaction: Other reaction  . Trazodone And Nefazodone Hives and Rash    VITALS:  Blood pressure 86/38, pulse 71, temperature 97.5 F (36.4 C), temperature source Oral, resp.  rate 24, height 5\' 6"  (1.676 m), weight 146.7 kg (323 lb 6.6 oz), SpO2 97 %.  PHYSICAL EXAMINATION:  GENERAL:  59 y.o.-year-old patient lying in the bed with no acute distress.  EYES: Pupils equal, round, reactive to light and accommodation. No scleral icterus. Extraocular muscles intact.  HEENT: Head atraumatic, normocephalic. Oropharynx and nasopharynx clear.  NECK:  Supple, no jugular venous distention. No thyroid enlargement, no tenderness.  LUNGS: Normal breath sounds bilaterally, no wheezing, rales,rhonchi or crepitation. No use of accessory muscles of respiration.  CARDIOVASCULAR: S1, S2 normal. No murmurs, rubs, or gallops.  ABDOMEN: Soft, nontender, nondistended. Bowel sounds present. No organomegaly or mass.  EXTREMITIES: No pedal edema, cyanosis, or clubbing.  NEUROLOGIC: Cranial nerves II through XII are intact. Muscle strength 5/5 in all extremities. Sensation intact. Gait not checked.  PSYCHIATRIC: The patient is alert and oriented x 3.  SKIN: No obvious rash, lesion, or ulcer.    LABORATORY PANEL:   CBC  Recent Labs Lab 11/17/14 0513  WBC 3.6  HGB 10.7*  HCT 33.5*  PLT 108*   ------------------------------------------------------------------------------------------------------------------  Chemistries   Recent Labs Lab 11/16/14 1503 11/17/14 0513  NA 137 137  K 6.8* 5.8*  CL 100* 97*  CO2 23 26  GLUCOSE 97 93  BUN 77* 81*  CREATININE 9.12* 10.07*  CALCIUM 8.9 8.9  MG  --  2.4  AST 25  --   ALT 14  --   ALKPHOS 303*  --   BILITOT 1.4*  --    ------------------------------------------------------------------------------------------------------------------  Cardiac Enzymes No results for input(s): TROPONINI in the last 168 hours. ------------------------------------------------------------------------------------------------------------------  RADIOLOGY:  No results found.  EKG:   Orders placed or performed during the hospital  encounter of  11/16/14  . ED EKG  . ED EKG  . EKG 12-Lead  . EKG 12-Lead    ASSESSMENT AND PLAN:  92/ 59 year old female admitted for AV graft declotting.1. AV graft  Clot/malfunctioning;unable to declot ,so she got new permacath. 2. ESRD: Hyperkalemia due to missed HD;now she will go for HD,nephro consulted, Hyperkalemia improved due to kayexalate/calcium gluconate. 4. Hypotension: chronic problem. Cont midodrine, Florinef, and hydrocortisone  5. Hypothyroidism: continue Synthroid 6. Depression: cont Zoloft and risperidone 7. Asthma/COPD: cont Symbicort (clarify diagnosis) 8. DVT prophylaxis: Heparin 9. GI prophylaxis: pantoprazole due to history of GERD Hemorrhoids;start on Anusol suppository.    All the records are reviewed and case discussed with Care Management/Social Workerr. Management plans discussed with the patient, family and they are in agreement.  CODE STATUS: full code  TOTAL TIME TAKING CARE OF THIS PATIENT:  35 min  POSSIBLE D/C IN  1-2  DAYS, DEPENDING ON CLINICAL CONDITION.   Epifanio Lesches M.D on 11/17/2014 at 11:38 AM  Between 7am to 6pm - Pager - [phone]  After 6pm go to [email or web address] - password [email or web address] [facility]  Office  [phone]  CC: Primary care physician; Alvester Morin, MD

## 2014-11-18 LAB — RENAL FUNCTION PANEL
Albumin: 3.5 g/dL (ref 3.5–5.0)
Anion gap: 12 (ref 5–15)
BUN: 55 mg/dL — ABNORMAL HIGH (ref 6–20)
CO2: 28 mmol/L (ref 22–32)
Calcium: 8.7 mg/dL — ABNORMAL LOW (ref 8.9–10.3)
Chloride: 92 mmol/L — ABNORMAL LOW (ref 101–111)
Creatinine, Ser: 7.1 mg/dL — ABNORMAL HIGH (ref 0.44–1.00)
GFR calc Af Amer: 7 mL/min — ABNORMAL LOW (ref >60)
GFR calc non Af Amer: 6 mL/min — ABNORMAL LOW (ref >60)
Glucose, Bld: 77 mg/dL (ref 65–99)
Phosphorus: 5 mg/dL — ABNORMAL HIGH (ref 2.5–4.6)
Potassium: 5.5 mmol/L — ABNORMAL HIGH (ref 3.5–5.1)
Sodium: 132 mmol/L — ABNORMAL LOW (ref 135–145)

## 2014-11-18 LAB — CBC
HCT: 32.9 % — ABNORMAL LOW (ref 35.0–47.0)
Hemoglobin: 10.5 g/dL — ABNORMAL LOW (ref 12.0–16.0)
MCH: 33.1 pg (ref 26.0–34.0)
MCHC: 32 g/dL (ref 32.0–36.0)
MCV: 103.4 fL — ABNORMAL HIGH (ref 80.0–100.0)
Platelets: 117 10*3/uL — ABNORMAL LOW (ref 150–440)
RBC: 3.18 MIL/uL — ABNORMAL LOW (ref 3.80–5.20)
RDW: 19.8 % — ABNORMAL HIGH (ref 11.5–14.5)
WBC: 3.3 10*3/uL — ABNORMAL LOW (ref 3.6–11.0)

## 2014-11-18 LAB — BASIC METABOLIC PANEL

## 2014-11-18 MED ORDER — CEFAZOLIN SODIUM 1-5 GM-% IV SOLN
1.0000 g | INTRAVENOUS | Status: AC
  Administered 2014-11-19: 1 g via INTRAVENOUS
  Filled 2014-11-18: qty 50

## 2014-11-18 NOTE — Progress Notes (Signed)
POST HD 

## 2014-11-18 NOTE — Progress Notes (Signed)
[name]  Daily Progress Note   Subjective  - feels better today states the left femoral catheter does not hurt like the last one did  Objective Filed Vitals:   11/18/14 1418 11/18/14 1552 11/18/14 1626 11/18/14 2031  BP: 92/42 83/47 97/74  81/51  Pulse: 67   59  Temp:    97.9 F (36.6 C)  TempSrc:      Resp:  18 18 18   Height:      Weight:      SpO2:    95%    Intake/Output Summary (Last 24 hours) at 11/18/14 2332 Last data filed at 11/18/14 2315  Gross per 24 hour  Intake      0 ml  Output    500 ml  Net   -500 ml    PULM  Normal effort , no use of accessory muscles CV  No JVD, RRR Abd      No distended, nontender VASC  No thrill or bruit in the right thigh AV graft  Laboratory CBC    Component Value Date/Time   WBC 3.3* 11/18/2014 0751   WBC 3.8 10/21/2014 0942   HGB 10.5* 11/18/2014 0751   HGB 9.5* 10/21/2014 0942   HCT 32.9* 11/18/2014 0751   HCT 30.1* 10/21/2014 0942   PLT 117* 11/18/2014 0751   PLT 107* 10/21/2014 0942    BMET    Component Value Date/Time   NA 132* 11/18/2014 0751   NA 137 10/21/2014 0941   K 5.5* 11/18/2014 0751   K 4.0 10/21/2014 0941   CL 92* 11/18/2014 0751   CL 96* 10/21/2014 0941   CO2 28 11/18/2014 0751   CO2 28 10/21/2014 0941   GLUCOSE 77 11/18/2014 0751   GLUCOSE 121* 10/21/2014 0941   BUN 55* 11/18/2014 0751   BUN 61* 10/21/2014 0941   CREATININE 7.10* 11/18/2014 0751   CREATININE 6.42* 10/21/2014 0941   CALCIUM 8.7* 11/18/2014 0751   CALCIUM 8.7* 10/21/2014 0941   CALCIUM 10.2 02/09/2010 1028   GFRNONAA 6* 11/18/2014 0751   GFRNONAA 7* 10/21/2014 0941   GFRAA 7* 11/18/2014 0751   GFRAA 8* 10/21/2014 0941    Assessment/Planning: 1. AV graft: is thrombosed.  She has now been dialysed via the temp cath and is ready for intervention.  I will plan thrombectomy in the morning. 2. ESRD: consult nephrology for dialysis (perhaps Wednesday after clot lysis). Cont Renvela. Potassium is  elevated due to missed dialysis. We'll shift intracellularly potassium concentration with insulin and Kayexalate. I have also given calcium gluconate. There are no EKG changes.  3. Epilepsy: continue Keppra, Lamictal, and Topamax per home regimen 4. Hypotension: chronic problem. Cont midodrine, Florinef, and hydrocortisone  5. Hypothyroidism: continue Synthroid 6. Depression: cont Zoloft and risperidone 7. Asthma/COPD: Jarvis Newcomer Symbicort (clarify diagnosis)    Schnier, Dolores [name]  11/18/2014, 11:32 PM

## 2014-11-18 NOTE — Progress Notes (Signed)
HD START 

## 2014-11-18 NOTE — Progress Notes (Signed)
HD END 

## 2014-11-18 NOTE — Progress Notes (Signed)
PRE HD   

## 2014-11-18 NOTE — Progress Notes (Signed)
Pt. Paced on monitor. 1 assist with walker to bathroom. Pt C/O  hemorrhoid pain earlier on shift, PRN Tylenol given per pt request. Pt rested well throughout shift. Continue to monitor.

## 2014-11-18 NOTE — Progress Notes (Signed)
[facility]  ROUNDING NOTE   Subjective:   Seen and examined on hemodialysis. Tolerating treatment well. UF goal of 0.5 litre  Objective:  Vital signs in last 24 hours:  Temp:  [97.6 F (36.4 C)-98.3 F (36.8 C)] 97.9 F (36.6 C) (05/05 0900) Pulse Rate:  [55-69] 55 (05/05 1100) Resp:  [15-22] 15 (05/05 1100) BP: (72-95)/(35-70) 95/50 mmHg (05/05 1100) SpO2:  [96 %-100 %] 96 % (05/05 0911) Weight:  [144.244 kg (318 lb)-146.6 kg (323 lb 3.1 oz)] 144.244 kg (318 lb) (05/05 0502)  Weight change: 3.263 kg (7 lb 3.1 oz) Filed Weights   11/17/14 1330 11/17/14 1753 11/18/14 0502  Weight: 146.6 kg (323 lb 3.1 oz) 144.425 kg (318 lb 6.4 oz) 144.244 kg (318 lb)    Intake/Output: I/O last 3 completed shifts: In: 250 [I.V.:250] Out: 3 [Stool:3]   Intake/Output this shift:     Physical Exam: General: NAD,   Head: Normocephalic, atraumatic. Moist oral mucosal membranes  Eyes: Anicteric, PERRL  Neck: Supple, trachea midline  Lungs:  Clear to auscultation  Heart: Regular rate and rhythm  Abdomen:  Soft, nontender,   Extremities: no peripheral edema.  Neurologic: Nonfocal, moving all four extremities  Skin: No lesions  Access: Right femoral thigh AVG with no bruit and no thrill, left femoral temp HD catheter 5/3 Dr. Delana Meyer    Basic Metabolic Panel:  Recent Labs Lab 11/16/14 1503 11/17/14 0513 11/18/14 0751  NA 137 137 132*  K 6.8* 5.8* 5.5*  CL 100* 97* 92*  CO2 23 26 28   GLUCOSE 97 93 77  BUN 77* 81* 55*  CREATININE 9.12* 10.07* 7.10*  CALCIUM 8.9 8.9 8.7*  MG  --  2.4  --   PHOS  --  5.6* 5.0*    Liver Function Tests:  Recent Labs Lab 11/16/14 1503 11/18/14 0751  AST 25  --   ALT 14  --   ALKPHOS 303*  --   BILITOT 1.4*  --   PROT 7.0  --   ALBUMIN 3.7 3.5   No results for input(s): LIPASE, AMYLASE in the last 168 hours. No results for input(s): AMMONIA in the last 168 hours.  CBC:  Recent Labs Lab 11/16/14 1811 11/17/14 0513  11/18/14 0751  WBC 3.7 3.6 3.3*  NEUTROABS 2.3  --   --   HGB 10.8* 10.7* 10.5*  HCT 33.2* 33.5* 32.9*  MCV 104.8* 104.6* 103.4*  PLT 105* 108* 117*    Cardiac Enzymes: No results for input(s): CKTOTAL, CKMB, CKMBINDEX, TROPONINI in the last 168 hours.  BNP: Invalid input(s): POCBNP  CBG: No results for input(s): GLUCAP in the last 168 hours.  Microbiology: Results for orders placed or performed during the hospital encounter of 10/19/14  Culture, blood (single)     Status: None   Collection Time: 10/19/14  5:09 PM  Result Value Ref Range Status   Micro Text Report   Final       COMMENT                   NO GROWTH AEROBICALLY/ANAEROBICALLY IN 5 DAYS   ANTIBIOTIC                                                      Culture, blood (single)     Status: None  Collection Time: 10/19/14  5:09 PM  Result Value Ref Range Status   Micro Text Report   Final       COMMENT                   NO GROWTH AEROBICALLY/ANAEROBICALLY IN 5 DAYS   ANTIBIOTIC                                                        Coagulation Studies: No results for input(s): LABPROT, INR in the last 72 hours.  Urinalysis: No results for input(s): COLORURINE, LABSPEC, PHURINE, GLUCOSEU, HGBUR, BILIRUBINUR, KETONESUR, PROTEINUR, UROBILINOGEN, NITRITE, LEUKOCYTESUR in the last 72 hours.  Invalid input(s): APPERANCEUR    Imaging: No results found.   Medications:   . sodium chloride Stopped (11/17/14 0900)   . budesonide-formoterol  2 puff Inhalation BID  . calcitRIOL  0.25 mcg Oral Q T,Th,Sa-HD  . calcium acetate  2,001 mg Oral TID WC  . cycloSPORINE  1 drop Both Eyes BID  . docusate sodium  100 mg Oral BID  . [START ON 11/23/2014] ferric gluconate (FERRLECIT/NULECIT) IV  125 mg Intravenous Q Tue-HD  . fludrocortisone  0.1 mg Oral Daily  . gabapentin  100 mg Oral BID  . heparin  5,000 Units Subcutaneous 3 times per day  . hydrocortisone   Rectal BID  . hydrocortisone  25 mg Rectal BID  .  lamoTRIgine  50 mg Oral QHS  . levothyroxine  50 mcg Oral QAC breakfast  . loratadine  10 mg Oral Daily  . metroNIDAZOLE   Topical BID  . midodrine  20 mg Oral TID  . OPTICHAMBER ADVANTAGE  1 each Other UD  . polyethylene glycol  17 g Oral BID  . risperiDONE  0.5 mg Oral QHS  . risperiDONE microspheres  37.5 mg Intramuscular Q14 Days  . sertraline  100 mg Oral Daily  . sertraline  50 mg Oral Daily  . sevelamer carbonate  800 mg Oral TID WC  . topiramate  200 mg Oral BID  . triamcinolone   Topical BID   sodium chloride, sodium chloride, sodium chloride, sodium chloride, acetaminophen, ALPRAZolam, bisacodyl, heparin, levETIRAcetam, levETIRAcetam, ondansetron **OR** ondansetron (ZOFRAN) IV, sodium chloride, traMADol, zolpidem  Assessment/ Plan:  59 y.o. female  [name] is a 59 y.o. white female with ESRD on hemodialysis TTS, bilateral nephrectomies, seizure disorder, morbid obesity, tubuerous sclerosis, hypotension, depression, asthma and secondary hyperparathyroidism, seizure disorder admit 4/19, OSA on CPAP  [facility]//[facility]// TTS  1.  ESRD with hyperkalemia:  Potassium 5.5  Hemodialysis yesterday for hyperkalemia. Now hemodialysis today for hyperkalemia and to get back on TTS schedule - Angiogram for Friday for thrombectomy  2.  Hypotension: I95.9 chronically with low BP. asymptomatic. Improved today.  - Currently on fludrcortisone, hydrocortisone, and midodrine.  - previously also on pseudophedrine Fludrocortisone is not effective in end stage renal disease and with bilateral nephrectomies Hydrocortisone is suppository??  3.  Anemia of Kidney Disease: hemoglobin 10.7.  - epogen with hemodialysis. Patient getting micera as outpatient.   - monitor chronic but stable thrombocytopenia.  - IV iron is ordered. No indication for this as on inpatient.   4.  SHPTH: phos 5 - renvela and calcium acetate with meals.  - calcitriol on dialysis days.     LOS:  [place], [place] 5/5/201611:33 AM

## 2014-11-18 NOTE — Progress Notes (Signed)
VSS. Iso for MRSA. Room air. Paced. Went to HD today. Pt complained of pain and received tylenol. Takes meds ok. A & O. Temp HD in L groin. BP runs low. K improved. Pt has not reported any further concerns at this time.

## 2014-11-18 NOTE — Care Management (Cosign Needed)
Patient presents from [facility].  It is anticipated patient will return to facility at discharge.  She is an established HD patient.  Dialysis coorinator informed of admission .  CSW informed of admission

## 2014-11-18 NOTE — Progress Notes (Signed)
[facility] [place] NAME: [name]    MR#:  [id number]  DATE OF BIRTH:  [birth year]  [place] hd today,possib;le declotting after hd.Denies any complaints.  CHIEF COMPLAINT:   Chief Complaint  Patient presents with  . Vascular Access Problem    REVIEW OF SYSTEMS:  CONSTITUTIONAL: No fever, fatigue or weakness.  EYES: No blurred or double vision.  EARS, NOSE, AND THROAT: No tinnitus or ear pain.  RESPIRATORY: No cough, shortness of breath, wheezing or hemoptysis.  CARDIOVASCULAR: No chest pain, orthopnea, edema.  GASTROINTESTINAL: No nausea, vomiting, diarrhea or abdominal pain.  GENITOURINARY: No dysuria, hematuria.  ENDOCRINE: No polyuria, nocturia,  HEMATOLOGY: No anemia, easy bruising or bleeding SKIN: No rash or lesion. MUSCULOSKELETAL: No joint pain or arthritis.   NEUROLOGIC: No tingling, numbness, weakness.  PSYCHIATRY: No anxiety or depression.   DRUG ALLERGIES:   Allergies  Allergen Reactions  . Iodinated Diagnostic Agents Hives  . Iodine     Other reaction(s): Other (See Comments) Other Reaction: contrast-itching and flushing  . Methylprednisolone Hives  . Other Itching    Other reaction(s): Other (See Comments) Uncoded Allergy. Allergen: iv contrast dye, Other Reaction: flushing Arthritis medications Other reaction(s): Other (See Comments) Uncoded Allergy. Allergen: iv contrast dye, Other Reaction: flushing Arthritis medications  . Pollen Extract Hives  . Trimethadione     Other reaction(s): Other (See Comments) Other Reaction: Other reaction  . Trazodone And Nefazodone Hives and Rash    VITALS:  Blood pressure 61/50, pulse 61, temperature 97.9 F (36.6 C), temperature source Oral, resp. rate 13, height 5\' 6"  (1.676 m), weight 144.244 kg (318 lb), SpO2 96 %.  PHYSICAL EXAMINATION:  GENERAL:  59 y.o.-year-old patient lying in the bed with no acute distress.  EYES: Pupils equal,  round, reactive to light and accommodation. No scleral icterus. Extraocular muscles intact.  HEENT: Head atraumatic, normocephalic. Oropharynx and nasopharynx clear.  NECK:  Supple, no jugular venous distention. No thyroid enlargement, no tenderness.  LUNGS: Normal breath sounds bilaterally, no wheezing, rales,rhonchi or crepitation. No use of accessory muscles of respiration.  CARDIOVASCULAR: S1, S2 normal. No murmurs, rubs, or gallops.  ABDOMEN: Soft, nontender, nondistended. Bowel sounds present. No organomegaly or mass.  EXTREMITIES: No pedal edema, cyanosis, or clubbing.  NEUROLOGIC: Cranial nerves II through XII are intact. Muscle strength 5/5 in all extremities. Sensation intact. Gait not checked.  PSYCHIATRIC: The patient is alert and oriented x 3.  SKIN: No obvious rash, lesion, or ulcer.    LABORATORY PANEL:   CBC  Recent Labs Lab 11/18/14 0751  WBC 3.3*  HGB 10.5*  HCT 32.9*  PLT 117*   ------------------------------------------------------------------------------------------------------------------  Chemistries   Recent Labs Lab 11/16/14 1503 11/17/14 0513 11/18/14 0751  NA 137 137 132*  K 6.8* 5.8* 5.5*  CL 100* 97* 92*  CO2 23 26 28   GLUCOSE 97 93 77  BUN 77* 81* 55*  CREATININE 9.12* 10.07* 7.10*  CALCIUM 8.9 8.9 8.7*  MG  --  2.4  --   AST 25  --   --   ALT 14  --   --   ALKPHOS 303*  --   --   BILITOT 1.4*  --   --    ------------------------------------------------------------------------------------------------------------------  Cardiac Enzymes No results for input(s): TROPONINI in the last 168 hours. ------------------------------------------------------------------------------------------------------------------  RADIOLOGY:  No results found.  EKG:   Orders placed or performed during the hospital encounter of 11/16/14  . ED EKG  .  ED EKG  . EKG 12-Lead  . EKG 12-Lead    ASSESSMENT AND PLAN:   59 year old female admitted for AV  graft declotting.1. AV graft  Clot/malfunctioning;unable to declot ,so she got new permacath. 2. ESRD: Hyperkalemia due to missed HD;now improving,getting temporary HDthru  Groin cath,possible SV graft declotting  Later today or tomorrow. 4. Hypotension: chronic problem. Cont midodrine, Florinef, and hydrocortisone  5. Hypothyroidism: continue Synthroid 6. Depression: cont Zoloft and risperidone 7. Asthma/COPD: cont Symbicort (clarify diagnosis) 8. DVT prophylaxis: Heparin 9. GI prophylaxis: pantoprazole due to history of GERD Hemorrhoids;start on Anusol suppository.    All the records are reviewed and case discussed with Care Management/Social Workerr. Management plans discussed with the patient, family and they are in agreement.  CODE STATUS: full code  TOTAL TIME TAKING CARE OF THIS PATIENT:  35 min  POSSIBLE D/C IN  1-2  DAYS, DEPENDING ON CLINICAL CONDITION.   Epifanio Lesches M.D on 11/18/2014 at 12:31 PM  Between 7am to 6pm - Pager - [phone]  After 6pm go to [email or web address] - password [email or web address] [facility]  Office  [phone]  CC: Primary care physician; Alvester Morin, MD

## 2014-11-18 NOTE — Progress Notes (Addendum)
RD Assessment  Admitted with: clogged dialysis port PMHx: MR, ESRD on HD, Obesity, Renal Insufficiency  Current Diet: Renal/ Carb modified Typical Food/ Fluid Intake: Reviewed, no data at present Meal/ Snack Patterns: Pateitn reports good appetite and intake of a regular diet at home. She does eat "healthy" by eating more vegetables and lean meats like chicken  Supplements: None  Food Allergies: NKFA Food Preferences: Reviewed  Ht: 66" Current weight: 318# BMI:  52.3 Weight Changes: Patient reports a 30# weight loss over last 10 years, intentional, as she wants to be more healthy.  UOP: Reviewed Digestive: Reviewed Gastrointestinal: Reviewed Skin: Reviewed Physical Findings: Reviewed  Labs: Electrolyte and Renal Profile:    Recent Labs Lab 11/16/14 1503 11/17/14 0513 11/18/14 0751  BUN 77* 81* 55*  CREATININE 9.12* 10.07* 7.10*  NA 137 137 132*  K 6.8* 5.8* 5.5*  MG  --  2.4  --   PHOS  --  5.6* 5.0*   Protein Profile:  Recent Labs Lab 11/16/14 1503 11/18/14 0751  ALBUMIN 3.7 3.5    Meds: Colace  PES Statement: No nutrition concerns at this time.  Diagnosis:  Intervention: Meals and Snacks: Cater to patient preferences   Monitoring/ Evaluation:  Electrolyte and Renal Profile Protein Profile   LOW Care Level

## 2014-11-19 ENCOUNTER — Ambulatory Visit: Admission: RE | Admit: 2014-11-19 | Payer: Medicare Other | Source: Ambulatory Visit | Admitting: Vascular Surgery

## 2014-11-19 ENCOUNTER — Encounter
Admission: EM | Disposition: A | Discharge status: Skilled Nursing Facility | Payer: Medicare Other | Source: Home / Self Care | Attending: Internal Medicine

## 2014-11-19 HISTORY — PX: PERIPHERAL VASCULAR CATHETERIZATION: SHX172C

## 2014-11-19 LAB — GLUCOSE, CAPILLARY: Glucose-Capillary: 77 mg/dL (ref 70–99)

## 2014-11-19 SURGERY — THROMBECTOMY
Anesthesia: Moderate Sedation | Laterality: Right

## 2014-11-19 SURGERY — A/V SHUNT INTERVENTION
Anesthesia: Moderate Sedation

## 2014-11-19 MED ORDER — FENTANYL CITRATE (PF) 100 MCG/2ML IJ SOLN
INTRAMUSCULAR | Status: DC | PRN
  Administered 2014-11-19: 25 ug via INTRAVENOUS
  Administered 2014-11-19: 100 ug via INTRAVENOUS

## 2014-11-19 MED ORDER — HYDROCORTISONE NA SUCCINATE PF 100 MG IJ SOLR
100.0000 mg | Freq: Once | INTRAMUSCULAR | Status: AC
  Administered 2014-11-19: 100 mg via INTRAVENOUS
  Filled 2014-11-19: qty 2

## 2014-11-19 MED ORDER — MIDAZOLAM HCL 2 MG/2ML IJ SOLN
INTRAMUSCULAR | Status: DC | PRN
  Administered 2014-11-19: 1 mg via INTRAVENOUS
  Administered 2014-11-19: 2 mg via INTRAVENOUS

## 2014-11-19 MED ORDER — HEPARIN SODIUM (PORCINE) 1000 UNIT/ML IJ SOLN
INTRAMUSCULAR | Status: DC | PRN
  Administered 2014-11-19: 4000 [IU] via INTRAVENOUS

## 2014-11-19 MED ORDER — ENOXAPARIN SODIUM 30 MG/0.3ML ~~LOC~~ SOLN: 30.0000 mg | SUBCUTANEOUS | Status: DC

## 2014-11-19 SURGICAL SUPPLY — 22 items
BALLN DORADO 8X60X80 (BALLOONS) ×4
BALLN ULTRV 018 7X40X75 (BALLOONS) ×4
BALLN ULTRVRSE 8X100X130 (BALLOONS) ×4 IMPLANT
BALLOON DORADO 8X60X80 (BALLOONS) ×2 IMPLANT
BALLOON ULTRV 018 7X40X75 (BALLOONS) ×2 IMPLANT
CATH BEACON 5.038 65CM KMP-01 (CATHETERS) ×4 IMPLANT
DRSG TEGADERM 4X4.75 (GAUZE/BANDAGES/DRESSINGS) ×8 IMPLANT
GLIDEWIRE ADV .035X180CM (WIRE) ×4 IMPLANT
GLIDEWIRE ADV .035X260CM (WIRE) ×4 IMPLANT
GUIDEWIRE AMPLATZ SHORT (WIRE) ×4 IMPLANT
GUIDEWIRE ANGLED .035X260CM (WIRE) ×4 IMPLANT
KIT 5FR STIFF NT/TG (MISCELLANEOUS) ×8 IMPLANT
KIT THROMB PERC PTD (MISCELLANEOUS) ×4 IMPLANT
PACK ANGIOGRAPHY (CUSTOM PROCEDURE TRAY) ×4 IMPLANT
SET AVX THROMB ULT (MISCELLANEOUS) ×4 IMPLANT
SET INTRO CAPELLA COAXIAL (SET/KITS/TRAYS/PACK) ×4 IMPLANT
SHEATH BRITE TIP 6FR 35CM (SHEATH) ×4 IMPLANT
SHEATH BRITE TIP 6FRX5.5 (SHEATH) ×8 IMPLANT
SHEATH BRITE TIP 7FRX5.5 (SHEATH) ×4 IMPLANT
STENT VIABAHN 8X50X120 (Permanent Stent) ×2 IMPLANT
TOWEL OR 17X26 4PK STRL BLUE (TOWEL DISPOSABLE) ×4 IMPLANT
WIRE G V18X300CM (WIRE) ×4 IMPLANT

## 2014-11-19 NOTE — Op Note (Signed)
OPERATIVE NOTE   PROCEDURE: 1. Right femoral arteriovenous graft cannulation under ultrasound guidance 3 2. Contrast injection right thigh AV loop graft. 3. Infusion of TPA for thrombolysis right thigh AV loop graft. 4. Mechanical thrombectomy right thigh AV loop graft with a combination of the Trerotola device and the AngioJet. 5. Percutaneous transluminal angioplasty and stent placement lateral limb of AV graft. 6. Percutaneous transluminal angioplasty of the arterial anastomosis AV graft. 7. Percutaneous transluminal and plasty of the right common femoral vein   PRE-OPERATIVE DIAGNOSIS: Complication right arteriovenous graft with thrombosis                                                       End Stage Renal Disease  POST-OPERATIVE DIAGNOSIS: same as above   SURGEON: Katha Cabal, M.D.  ANESTHESIA: Conscious Sedation   ESTIMATED BLOOD LOSS: minimal  FINDING(S): 1. Thrombus within the AV loop graft right thigh associated with 3 separate and distinct lesions one at the arterial anastomosis one in the area of cannulation of the lateral limb of the graft and one in the common femoral just proximal to the venous anastomosis.  SPECIMEN(S):  None  CONTRAST: 50 cc  FLUOROSCOPY TIME: 16.7 minutes  INDICATIONS: [name] is a 59 y.o. female who  presents with malfunctioning right AV access.  The patient is scheduled for right thigh AV loop graft thrombectomy with intervention .  The patient is aware the risks include but are not limited to: bleeding, infection, thrombosis of the cannulated access, and possible anaphylactic reaction to the contrast.  The patient acknowledges if the access can not be salvaged a tunneled catheter will be needed and will be placed during this procedure.  The patient is aware of the risks of the procedure and elects to proceed with the angiogram and intervention.  DESCRIPTION: After full informed written consent was obtained, the patient was  brought back to the Special Procedure suite and placed supine position.  Appropriate cardiopulmonary monitors were placed.  The right thigh was prepped and draped in the standard fashion.  Appropriate timeout is called. The right AV loop graft  was cannulated with a micropuncture needle under ultrasound guidance.  The microwire was advanced and the needle was exchanged for  a microsheath.  The J-wire was then advanced and a 6 Fr sheath inserted. In similar fashion working in the opposite direction a second 6 [place] sheath was inserted with ultrasound guidance.  Hand injections were completed to image the access from the arterial anastomosis through the entire access.  The central venous structures were also imaged by hand injections.  Based on the images,  thrombus is identified through the graft. In the area of repetitive, cannulation in the lateral limb there appears to be deformation of the stent. With the Kumpe catheter advanced into the common femoral artery there is a stump of the graft noted but the artery itself is widely patent. With the Kumpe catheter advanced beyond the venous anastomosis there is narrowing of the common femoral vein. The external iliac and common iliac veins are patent. Inferior vena cava is patent.  The graft is then placed with 6 mg of TPA diluted and reconstituted 10 cc was performed using a Kumpe catheter first through the venous portion and then through the arterial portion. The TPA is allowed to dwell  for approximately 30 minutes. Subsequently the Trerotola device is prepped on the back table and then used to perform mechanical thrombectomy the area of stent deformation does not allow passage of the Trerotola device and therefore the 035 wire is advanced through this area into the arterial system Kumpe catheter is then advanced small hand injection, prepped contrast is used to verify the true lumen and an 018 wire is exchanged for the 035 wire. An 8 x 50 Viabahn stent is then  deployed across this area and postdilated with a 8 x 40 Dorado balloon to 20 atm.   Because of the length of stent needed the antegrade sheath that had previously been placed at the start of the case was removed as it was covered by this stent. The stent was then deployed as noted above and postdilated. Pursestring suture of Monocryl was placed in the skin. Subsequently a spot more distally in the graft was selected ultrasound was utilized and a microneedle was inserted into the graft followed by microwire marker sheath and then a J-wire and a 6 [place] sheath. This reestablished the antegrade sheath for completion of the intervention.   At this point having performed thrombectomy with the teratoma in combination with TPA there is no flow. There is a narrowing of greater than 80% at the arterial anastomosis as well as a narrowing of the common femoral vein that is well visualized now the flow is reestablished. There are some irregularities of the graft on the venous side but these do not appear to be flow limiting. The 8 mm Dorado balloon is then used to angioplasty the arterial anastomosis. Subsequently an 035 wire was advanced through the antegrade sheath and the 8 mm Dor[place] balloon is advanced through this sheath across the common femoral and inflated to 28 atm for 2 minutes. The Kumpe catheter is then advanced over the wire through the retrograde sheath and positioned in the common femoral artery and hand injection of contrast is utilized to demonstrate the entire loop graft from the arterial anastomosis to the external iliac vein. All 3 lesions appear to be well treated and there is now a rapid flow of contrast through the graft without any hemodynamically significant lesions identified.    A 4-0 Monocryl purse-string suture was sewn around all the sheaths.  The sheaths were removed and light pressure was applied.  A sterile bandage was applied to the puncture sites.  INTERPRETATION: Initial images  demonstrated thrombus within the graft with the catheter advanced into the arterial and venous system patency of both the arterial and venous systems were established. Following infusion of TPA for thrombolysis and subsequently mechanical thrombectomy there is clearing of the AV loop graft and 3 lesions which are separate and distinct on covered. These are treated as described above all successfully noting the lesion within the graft required a Viabahn stent.  Successful salvage of right thigh AV loop graft as described.    COMPLICATIONS:  None  CONDITION:  Carlynn Purl, M.D [facility] Office: [phone]  11/19/2014 8:46 PM

## 2014-11-19 NOTE — Care Management Note (Signed)
Case Management Note  Patient Details  Name: [name] MRN: [id number] Date of Birth: 26-Dec-1955  Subjective/Objective:                    Action/Plan:   Expected Discharge Date:                  Expected Discharge Plan:     In-House Referral:     Discharge planning Services     Post Acute Care Choice:    Choice offered to:     DME Arranged:    DME Agency:     HH Arranged:    [place] Agency:     Status of Service:     Medicare Important Message Given:   yes   Date Medicare IM Given:   11/19/14 Medicare IM give by:   Joni Reining Date Additional Medicare IM Given:    Additional Medicare Important Message give by:     If discussed at [place] of Stay Meetings, dates discussed:    Additional Comments:  Katrina Stack, RN 11/19/2014, 8:48 AM

## 2014-11-19 NOTE — Progress Notes (Signed)
[facility] [place] [place] NAME: [name]    MR#:  [id number]  DATE OF BIRTH:  [birth year]  [place] hd today,possib;le declotting after hd.Denies any complaints.  CHIEF COMPLAINT:   Chief Complaint  Patient presents with  . Vascular Access Problem    REVIEW OF SYSTEMS:  CONSTITUTIONAL: No fever, fatigue or weakness.  EYES: No blurred or double vision.  EARS, NOSE, AND THROAT: No tinnitus or ear pain.  RESPIRATORY: No cough, shortness of breath, wheezing or hemoptysis.  CARDIOVASCULAR: No chest pain, orthopnea, edema.  GASTROINTESTINAL: No nausea, vomiting, diarrhea or abdominal pain.  GENITOURINARY: No dysuria, hematuria.  ENDOCRINE: No polyuria, nocturia,  HEMATOLOGY: No anemia, easy bruising or bleeding SKIN: No rash or lesion. MUSCULOSKELETAL: No joint pain or arthritis.   NEUROLOGIC: No tingling, numbness, weakness.  PSYCHIATRY: No anxiety or depression.   DRUG ALLERGIES:   Allergies  Allergen Reactions  . Iodinated Diagnostic Agents Hives  . Iodine     Other reaction(s): Other (See Comments) Other Reaction: contrast-itching and flushing  . Methylprednisolone Hives  . Other Itching    Other reaction(s): Other (See Comments) Uncoded Allergy. Allergen: iv contrast dye, Other Reaction: flushing Arthritis medications Other reaction(s): Other (See Comments) Uncoded Allergy. Allergen: iv contrast dye, Other Reaction: flushing Arthritis medications  . Pollen Extract Hives  . Trimethadione     Other reaction(s): Other (See Comments) Other Reaction: Other reaction  . Trazodone And Nefazodone Hives and Rash    VITALS:  Blood pressure 97/51, pulse 59, temperature 98.3 F (36.8 C), temperature source Oral, resp. rate 20, height 5\' 6"  (1.676 m), weight 145.287 kg (320 lb 4.8 oz), SpO2 99 %.  PHYSICAL EXAMINATION:  GENERAL:  59 y.o.-year-old patient lying in the bed with no acute distress.  EYES: Pupils  equal, round, reactive to light and accommodation. No scleral icterus. Extraocular muscles intact.  HEENT: Head atraumatic, normocephalic. Oropharynx and nasopharynx clear.  NECK:  Supple, no jugular venous distention. No thyroid enlargement, no tenderness.  LUNGS: Normal breath sounds bilaterally, no wheezing, rales,rhonchi or crepitation. No use of accessory muscles of respiration.  CARDIOVASCULAR: S1, S2 normal. No murmurs, rubs, or gallops.  ABDOMEN: Soft, nontender, nondistended. Bowel sounds present. No organomegaly or mass.  EXTREMITIES: No pedal edema, cyanosis, or clubbing.  NEUROLOGIC: Cranial nerves II through XII are intact. Muscle strength 5/5 in all extremities. Sensation intact. Gait not checked.  PSYCHIATRIC: The patient is alert and oriented x 3.  SKIN: No obvious rash, lesion, or ulcer.    LABORATORY PANEL:   CBC  Recent Labs Lab 11/18/14 0751  WBC 3.3*  HGB 10.5*  HCT 32.9*  PLT 117*   ------------------------------------------------------------------------------------------------------------------  Chemistries   Recent Labs Lab 11/16/14 1503 11/17/14 0513 11/18/14 0751  NA 137 137 132*  K 6.8* 5.8* 5.5*  CL 100* 97* 92*  CO2 23 26 28   GLUCOSE 97 93 77  BUN 77* 81* 55*  CREATININE 9.12* 10.07* 7.10*  CALCIUM 8.9 8.9 8.7*  MG  --  2.4  --   AST 25  --   --   ALT 14  --   --   ALKPHOS 303*  --   --   BILITOT 1.4*  --   --    ------------------------------------------------------------------------------------------------------------------  Cardiac Enzymes No results for input(s): TROPONINI in the last 168 hours. ------------------------------------------------------------------------------------------------------------------  RADIOLOGY:  No results found.  EKG:   Orders placed or performed during the hospital encounter of 11/16/14  . ED  EKG  . ED EKG  . EKG 12-Lead  . EKG 12-Lead    ASSESSMENT AND PLAN:   59 year old female admitted  for AV graft declotting.1. AV graft  Clot/malfunctioning;unable to declot ,so she got new permacath. 2. ESRD: Hyperkalemia due to missed HD;now improving,getting temporary HDthru  Groin cath,for AV graft thrombectomy/angiogram today,possible d/chome am after HD to make sure av graft work for HD 4. Hypotension: chronic problem. Cont midodrine, Florinef, and hydrocortisone  5. Hypothyroidism: continue Synthroid 6. Depression: cont Zoloft and risperidone 7. Asthma/COPD: cont Symbicort (clarify diagnosis) 8. DVT prophylaxis: Heparin 9. GI prophylaxis: pantoprazole due to history of GERD Hemorrhoids;start on Anusol suppository. Seizure disorder;continue Lamictal,keppra Depression/anxiety;continue meds DVT prophylaxis;continue lovenox.d/c SCD as she has temporary HD in [place], rosacea of face; Possible d/cafter hd tiomorrow Bradycardia;;monitor on tele,not on any bblckers    All the records are reviewed and case discussed with Care Management/Social Workerr. Management plans discussed with the patient, family and they are in agreement.  CODE STATUS: full code  TOTAL TIME TAKING CARE OF THIS PATIENT:  35 min  POSSIBLE D/C IN  1-2  DAYS, DEPENDING ON CLINICAL CONDITION.   Epifanio Lesches M.D on 11/19/2014 at 11:44 AM  Between 7am to 6pm - Pager - [phone]  After 6pm go to [email or web address] - password [email or web address] [facility]  Office  [phone]  CC: Primary care physician; Alvester Morin, MD

## 2014-11-19 NOTE — OR Nursing (Signed)
Thrombectomy, thrombolysis and pta of right thigh graft arterial side, venous portion, and venous anastamosis by dr.schnier site sutured and sheaths pulled.  t [place], rtr managing rt thigh site and dressing.  Fentanyl 142mcg, versed 5mg , heparin 4000u, and ancef 1gm given during procedure.  To spr for recovery, report given to spr nursing staff at bedside.

## 2014-11-19 NOTE — Clinical Social Work Note (Signed)
CSW attempted to assess pt however she was not in her room.  CSW will attempt assessment again later.  CSW did confirm that she was from [facility]

## 2014-11-19 NOTE — Brief Op Note (Signed)
PREOPERATIVE: Thrombosed right thigh AV graft  POSTOPERATIVE:  Same  PROCEDURE PERFORMED: 1.  Contrast injection right thigh AV graft 2.  Infusion of TPA for venous thrombolysis 3.  Percutaneous transluminal angioplasty and stent placement lateral limb of AV graft 4.  Percutaneous transluminal and plasty of the arterial anastomosis AV graft 5.  Percutaneous transluminal and plasty of the common femoral artery AV graft 6.  Mechanical thrombectomy right thigh AV graft 7.  Ultrasound guided insertion of sheaths 3  SURGEON: Katha Cabal M.D.  FINDINGS: AV graft was found to be thrombosed, there was a high-grade stenosis at the arterial anastomosis as well as in the common femoral proximal to the venous anastomosis. There was a consultation of one of the existing via Bond stents creating a flow limiting stenosis in the lateral limb of the loop graft.  SEDATION: Conscious sedation with Versed and fentanyl  CONDITION: Patient stable  COMPLICATIONS: None  ESTIMATED BLOOD LOSS: Minimal

## 2014-11-19 NOTE — Progress Notes (Signed)
[facility]  ROUNDING NOTE   Subjective:   Hemodialysis yesterday through temp HD catheter. Tolerated treatment well. UF of 0.5  Objective:  Vital signs in last 24 hours:  Temp:  [97.9 F (36.6 C)-98.3 F (36.8 C)] 98.3 F (36.8 C) (05/06 0508) Pulse Rate:  [55-67] 59 (05/06 0508) Resp:  [11-20] 20 (05/06 0508) BP: (61-97)/(27-74) 97/51 mmHg (05/06 0508) SpO2:  [95 %-99 %] 99 % (05/06 0508) Weight:  [144.4 kg (318 lb 5.5 oz)-145.287 kg (320 lb 4.8 oz)] 145.287 kg (320 lb 4.8 oz) (05/06 0508)  Weight change: -2.2 kg (-4 lb 13.6 oz) Filed Weights   11/18/14 0502 11/18/14 1230 11/19/14 0508  Weight: 144.244 kg (318 lb) 144.4 kg (318 lb 5.5 oz) 145.287 kg (320 lb 4.8 oz)    Intake/Output: I/O last 3 completed shifts: In: -  Out: 501 [Other:500; Stool:1]   Intake/Output this shift:     Physical Exam: General: NAD,   Head: Normocephalic, atraumatic. Moist oral mucosal membranes  Eyes: Anicteric, PERRL  Neck: Supple, trachea midline  Lungs:  Clear to auscultation  Heart: Regular rate and rhythm  Abdomen:  Soft, nontender,   Extremities: no peripheral edema.  Neurologic: Nonfocal, moving all four extremities  Skin: No lesions  Access: Right femoral thigh AVG with no bruit and no thrill, left femoral temp HD catheter 5/3 Dr. Delana Meyer    Basic Metabolic Panel:  Recent Labs Lab 11/16/14 1503 11/17/14 0513 11/18/14 0751  NA 137 137 132*  K 6.8* 5.8* 5.5*  CL 100* 97* 92*  CO2 23 26 28   GLUCOSE 97 93 77  BUN 77* 81* 55*  CREATININE 9.12* 10.07* 7.10*  CALCIUM 8.9 8.9 8.7*  MG  --  2.4  --   PHOS  --  5.6* 5.0*    Liver Function Tests:  Recent Labs Lab 11/16/14 1503 11/18/14 0751  AST 25  --   ALT 14  --   ALKPHOS 303*  --   BILITOT 1.4*  --   PROT 7.0  --   ALBUMIN 3.7 3.5   No results for input(s): LIPASE, AMYLASE in the last 168 hours. No results for input(s): AMMONIA in the last 168 hours.  CBC:  Recent Labs Lab 11/16/14 1811  11/17/14 0513 11/18/14 0751  WBC 3.7 3.6 3.3*  NEUTROABS 2.3  --   --   HGB 10.8* 10.7* 10.5*  HCT 33.2* 33.5* 32.9*  MCV 104.8* 104.6* 103.4*  PLT 105* 108* 117*    Cardiac Enzymes: No results for input(s): CKTOTAL, CKMB, CKMBINDEX, TROPONINI in the last 168 hours.  BNP: Invalid input(s): POCBNP  CBG: No results for input(s): GLUCAP in the last 168 hours.  Microbiology: Results for orders placed or performed during the hospital encounter of 10/19/14  Culture, blood (single)     Status: None   Collection Time: 10/19/14  5:09 PM  Result Value Ref Range Status   Micro Text Report   Final       COMMENT                   NO GROWTH AEROBICALLY/ANAEROBICALLY IN 5 DAYS   ANTIBIOTIC                                                      Culture, blood (single)  Status: None   Collection Time: 10/19/14  5:09 PM  Result Value Ref Range Status   Micro Text Report   Final       COMMENT                   NO GROWTH AEROBICALLY/ANAEROBICALLY IN 5 DAYS   ANTIBIOTIC                                                        Coagulation Studies: No results for input(s): LABPROT, INR in the last 72 hours.  Urinalysis: No results for input(s): COLORURINE, LABSPEC, PHURINE, GLUCOSEU, HGBUR, BILIRUBINUR, KETONESUR, PROTEINUR, UROBILINOGEN, NITRITE, LEUKOCYTESUR in the last 72 hours.  Invalid input(s): APPERANCEUR    Imaging: No results found.   Medications:   . sodium chloride Stopped (11/17/14 0900)   . budesonide-formoterol  2 puff Inhalation BID  . calcitRIOL  0.25 mcg Oral Q T,Th,Sa-HD  . calcium acetate  2,001 mg Oral TID WC  .  ceFAZolin (ANCEF) IV  1 g Intravenous On Call  . cycloSPORINE  1 drop Both Eyes BID  . docusate sodium  100 mg Oral BID  . fludrocortisone  0.1 mg Oral Daily  . gabapentin  100 mg Oral BID  . heparin  5,000 Units Subcutaneous 3 times per day  . hydrocortisone   Rectal BID  . hydrocortisone  25 mg Rectal BID  . lamoTRIgine  50 mg Oral QHS   . levothyroxine  50 mcg Oral QAC breakfast  . loratadine  10 mg Oral Daily  . metroNIDAZOLE   Topical BID  . midodrine  20 mg Oral TID  . OPTICHAMBER ADVANTAGE  1 each Other UD  . polyethylene glycol  17 g Oral BID  . risperiDONE  0.5 mg Oral QHS  . risperiDONE microspheres  37.5 mg Intramuscular Q14 Days  . sertraline  100 mg Oral Daily  . sertraline  50 mg Oral Daily  . sevelamer carbonate  800 mg Oral TID WC  . topiramate  200 mg Oral BID  . triamcinolone   Topical BID   sodium chloride, sodium chloride, sodium chloride, sodium chloride, acetaminophen, ALPRAZolam, bisacodyl, heparin, levETIRAcetam, levETIRAcetam, ondansetron **OR** ondansetron (ZOFRAN) IV, sodium chloride, traMADol, zolpidem  Assessment/ Plan:  59 y.o. female  [name] is a 59 y.o. white female with ESRD on hemodialysis TTS, bilateral nephrectomies, seizure disorder, morbid obesity, tubuerous sclerosis, hypotension, depression, asthma and secondary hyperparathyroidism, seizure disorder admit 4/19, OSA on CPAP  [facility]//[facility]// TTS  1.  ESRD with hyperkalemia:  Potassium 5.5 Complication of dialysis device. Thrombosed thigh AVG. Currently with temp HD catheter.  Hemodialysis for last two days for hyperkalemia.  - Angiogram for today for thrombectomy  2.  Hypotension: I95.9 chronically with low BP. asymptomatic. Improved today.  - Currently on fludrcortisone, hydrocortisone, and midodrine.  - previously also on pseudophedrine Fludrocortisone is not effective in end stage renal disease and with bilateral nephrectomies Hydrocortisone is suppository??  3.  Anemia of Kidney Disease: hemoglobin 10.5. With iron defciency anemia - epogen with hemodialysis. Patient getting micera as outpatient.   - monitor chronic but stable thrombocytopenia.  - No indication for IV iron as on inpatient.   4.  SHPTH: phos 5 - renvela and calcium acetate with meals.  - calcitriol on dialysis days.  LOS: 3 [name] 5/6/201610:22 AM

## 2014-11-20 LAB — RENAL FUNCTION PANEL
ANION GAP: 13 (ref 5–15)
Albumin: 3.6 g/dL (ref 3.5–5.0)
BUN: 54 mg/dL — AB (ref 6–20)
CO2: 27 mmol/L (ref 22–32)
Calcium: 8.9 mg/dL (ref 8.9–10.3)
Chloride: 88 mmol/L — ABNORMAL LOW (ref 101–111)
Creatinine, Ser: 7.08 mg/dL — ABNORMAL HIGH (ref 0.44–1.00)
GFR calc non Af Amer: 6 mL/min — ABNORMAL LOW (ref >60)
GFR, EST AFRICAN AMERICAN: 7 mL/min — AB (ref >60)
Glucose, Bld: 115 mg/dL — ABNORMAL HIGH (ref 65–99)
POTASSIUM: 4.8 mmol/L (ref 3.5–5.1)
Phosphorus: 5.1 mg/dL — ABNORMAL HIGH (ref 2.5–4.6)
SODIUM: 128 mmol/L — AB (ref 135–145)

## 2014-11-20 LAB — CBC
HEMATOCRIT: 32.2 % — AB (ref 35.0–47.0)
Hemoglobin: 10.4 g/dL — ABNORMAL LOW (ref 12.0–16.0)
MCH: 33.3 pg (ref 26.0–34.0)
MCHC: 32.3 g/dL (ref 32.0–36.0)
MCV: 103.3 fL — ABNORMAL HIGH (ref 80.0–100.0)
Platelets: 112 10*3/uL — ABNORMAL LOW (ref 150–440)
RBC: 3.11 MIL/uL — AB (ref 3.80–5.20)
RDW: 18.6 % — ABNORMAL HIGH (ref 11.5–14.5)
WBC: 4 10*3/uL (ref 3.6–11.0)

## 2014-11-20 MED ORDER — SODIUM CHLORIDE 0.9 % IV SOLN: 100.0000 mL | INTRAVENOUS | Status: DC | PRN

## 2014-11-20 MED ORDER — PSEUDOEPHEDRINE HCL 30 MG PO TABS
30.0000 mg | ORAL_TABLET | Freq: Every day | ORAL | Status: DC
  Filled 2014-11-20 (×2): qty 1

## 2014-11-20 MED ORDER — PSEUDOEPHEDRINE HCL 30 MG PO TABS: 30.0000 mg | ORAL_TABLET | Freq: Every day | ORAL | Status: DC

## 2014-11-20 MED ORDER — POLYETHYLENE GLYCOL 3350 17 G PO PACK: 17.0000 g | PACK | Freq: Every day | ORAL | Status: DC

## 2014-11-20 NOTE — Progress Notes (Signed)
Pt alertx4. VSS. No complaints of pain. Stand by to bathroom. Contact precautions maintained. Pt resting in bed. Will continue to monitor.

## 2014-11-20 NOTE — Progress Notes (Signed)
[facility]  ROUNDING NOTE   Subjective:   Seen and examined on hemodialysis. Tolerating treatment well. Via AVG. UF goal of 1.5 litres.  Thrombolysis with TPA yesterday by Dr. Delana Meyer  Objective:  Vital signs in last 24 hours:  Temp:  [97.7 F (36.5 C)-97.8 F (36.6 C)] 97.7 F (36.5 C) (05/07 0955) Pulse Rate:  [55-77] 57 (05/07 1130) Resp:  [12-24] 16 (05/07 1130) BP: (61-106)/(37-90) 66/47 mmHg (05/07 1130) SpO2:  [84 %-100 %] 93 % (05/07 0955) Weight:  [147.419 kg (325 lb)-149 kg (328 lb 7.8 oz)] 149 kg (328 lb 7.8 oz) (05/07 0955)  Weight change: 3.019 kg (6 lb 10.5 oz) Filed Weights   11/19/14 0508 11/20/14 0554 11/20/14 0955  Weight: 145.287 kg (320 lb 4.8 oz) 147.419 kg (325 lb) 149 kg (328 lb 7.8 oz)    Intake/Output:     Intake/Output this shift:     Physical Exam: General: NAD,   Head: Normocephalic, atraumatic. Moist oral mucosal membranes  Eyes: Anicteric, PERRL  Neck: Supple, trachea midline  Lungs:  Clear to auscultation  Heart: Regular rate and rhythm  Abdomen:  Soft, nontender,   Extremities: no peripheral edema.  Neurologic: Nonfocal, moving all four extremities  Skin: No lesions  Access: Right femoral thigh AVG left femoral temp HD catheter 5/3 Dr. Delana Meyer    Basic Metabolic Panel:  Recent Labs Lab 11/16/14 1503 11/17/14 0513 11/18/14 0751 11/20/14 1110  NA 137 137 132* 128*  K 6.8* 5.8* 5.5* 4.8  CL 100* 97* 92* 88*  CO2 23 26 28 27   GLUCOSE 97 93 77 115*  BUN 77* 81* 55* 54*  CREATININE 9.12* 10.07* 7.10* 7.08*  CALCIUM 8.9 8.9 8.7* 8.9  MG  --  2.4  --   --   PHOS  --  5.6* 5.0* 5.1*    Liver Function Tests:  Recent Labs Lab 11/16/14 1503 11/18/14 0751 11/20/14 1110  AST 25  --   --   ALT 14  --   --   ALKPHOS 303*  --   --   BILITOT 1.4*  --   --   PROT 7.0  --   --   ALBUMIN 3.7 3.5 3.6   No results for input(s): LIPASE, AMYLASE in the last 168 hours. No results for input(s): AMMONIA in the last  168 hours.  CBC:  Recent Labs Lab 11/16/14 1811 11/17/14 0513 11/18/14 0751 11/20/14 1110  WBC 3.7 3.6 3.3* 4.0  NEUTROABS 2.3  --   --   --   HGB 10.8* 10.7* 10.5* 10.4*  HCT 33.2* 33.5* 32.9* 32.2*  MCV 104.8* 104.6* 103.4* 103.3*  PLT 105* 108* 117* 112*    Cardiac Enzymes: No results for input(s): CKTOTAL, CKMB, CKMBINDEX, TROPONINI in the last 168 hours.  BNP: Invalid input(s): POCBNP  CBG:  Recent Labs Lab 11/19/14 1155  GLUCAP 77    Microbiology: Results for orders placed or performed during the hospital encounter of 10/19/14  Culture, blood (single)     Status: None   Collection Time: 10/19/14  5:09 PM  Result Value Ref Range Status   Micro Text Report   Final       COMMENT                   NO GROWTH AEROBICALLY/ANAEROBICALLY IN 5 DAYS   ANTIBIOTIC  Culture, blood (single)     Status: None   Collection Time: 10/19/14  5:09 PM  Result Value Ref Range Status   Micro Text Report   Final       COMMENT                   NO GROWTH AEROBICALLY/ANAEROBICALLY IN 5 DAYS   ANTIBIOTIC                                                        Coagulation Studies: No results for input(s): LABPROT, INR in the last 72 hours.  Urinalysis: No results for input(s): COLORURINE, LABSPEC, PHURINE, GLUCOSEU, HGBUR, BILIRUBINUR, KETONESUR, PROTEINUR, UROBILINOGEN, NITRITE, LEUKOCYTESUR in the last 72 hours.  Invalid input(s): APPERANCEUR    Imaging: No results found.   Medications:   . sodium chloride Stopped (11/17/14 0900)   . budesonide-formoterol  2 puff Inhalation BID  . calcitRIOL  0.25 mcg Oral Q T,Th,Sa-HD  . calcium acetate  2,001 mg Oral TID WC  . cycloSPORINE  1 drop Both Eyes BID  . docusate sodium  100 mg Oral BID  . fludrocortisone  0.1 mg Oral Daily  . gabapentin  100 mg Oral BID  . heparin  5,000 Units Subcutaneous 3 times per day  . hydrocortisone   Rectal BID  . hydrocortisone  25 mg  Rectal BID  . lamoTRIgine  50 mg Oral QHS  . levothyroxine  50 mcg Oral QAC breakfast  . loratadine  10 mg Oral Daily  . metroNIDAZOLE   Topical BID  . midodrine  20 mg Oral TID  . OPTICHAMBER ADVANTAGE  1 each Other UD  . polyethylene glycol  17 g Oral BID  . risperiDONE  0.5 mg Oral QHS  . risperiDONE microspheres  37.5 mg Intramuscular Q14 Days  . sertraline  100 mg Oral Daily  . sertraline  50 mg Oral Daily  . sevelamer carbonate  800 mg Oral TID WC  . topiramate  200 mg Oral BID  . triamcinolone   Topical BID   sodium chloride, sodium chloride, sodium chloride, sodium chloride, sodium chloride, sodium chloride, acetaminophen, ALPRAZolam, bisacodyl, heparin, levETIRAcetam, levETIRAcetam, ondansetron **OR** ondansetron (ZOFRAN) IV, sodium chloride, traMADol, zolpidem  Assessment/ Plan:  59 y.o. female  Ms. Riebe is a 59 y.o. white female with ESRD on hemodialysis TTS, bilateral nephrectomies, seizure disorder, morbid obesity, tubuerous sclerosis, hypotension, depression, asthma and secondary hyperparathyroidism, seizure disorder admit 4/19, OSA on CPAP  [facility]//[facility]// TTS  1.  ESRD with hyperkalemia:  Potassium 4.8 Complication of dialysis device. Thrombosed thigh AVG status post thrombolysis on 5/6 Dr. Delana Meyer Continue TTS schedule.  Tolerating dialysis today.   2.  Hypotension: I95.9 chronically with low BP. asymptomatic. - Currently on fludrcortisone, hydrocortisone, and midodrine.  - previously also on pseudophedrine - will restart today.  Fludrocortisone is not effective in end stage renal disease and with bilateral nephrectomies Hydrocortisone is suppository??  3.  Anemia of Kidney Disease: hemoglobin 10.5. With iron defciency anemia - epogen with hemodialysis. Patient getting micera as outpatient.   - monitor chronic but stable thrombocytopenia.  - No indication for IV iron as on inpatient.   4.  SHPTH: phos 5 - renvela and calcium  acetate with meals.  - calcitriol on dialysis days.  LOS: [place], Cristiana Yochim 5/7/201612:25 PM

## 2014-11-20 NOTE — Progress Notes (Signed)
Pt for d/c back to [facility] today. CSW confirmed with Amy at [facility] that pt is able to return today. [facility] to provide transport. Packet complete and RN to call report. CSW signing off. Wandra Feinstein, MSW, LCSW [phone] (weekend coverage)

## 2014-11-20 NOTE — Progress Notes (Signed)
HD tx start 

## 2014-11-20 NOTE — Progress Notes (Signed)
Temp HD catheter was removed. No bleeding or hematoma. Iso for MRSA in urine. 2 L of oxygen. Paced. Takes meds ok. A & O. No pain. Miralax and dulolax was given. Report was called to [facility] at [facility]. Facility [facility] to take pt back. Discharge instructions in packet. Pt has no further concerns at this time.

## 2014-11-20 NOTE — Discharge Summary (Signed)
[facility] [place]   PATIENT NAME: [name]    MR#:  [id number]  DATE OF BIRTH:  29-Jul-1955  DATE OF ADMISSION:  11/16/2014 ADMITTING PHYSICIAN: Harrie Foreman, MD  DATE OF DISCHARGE: 11/20/2014  PRIMARY CARE PHYSICIAN: Alvester Morin, MD    ADMISSION DIAGNOSIS:  AV fistula occlusion, initial encounter [T82.898A] Hypotension, unspecified hypotension type [I95.9]  DISCHARGE DIAGNOSIS:  Active Problems:   AV graft malfunction   SECONDARY DIAGNOSIS:   Past Medical History  Diagnosis Date  . Renal insufficiency   . Mental retardation   . Sleep apnea   . End-stage renal disease on hemodialysis   . Tuberous sclerosis   . Obesity   . GERD (gastroesophageal reflux disease)   . Depression   . Mental retardation   . Sleep apnea     on c-pap  . Hx MRSA infection   . Obesity   . Complication of anesthesia     "sometimes I don't wake up on time; depends on how much med"  . Asthma   . 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 Osteoarthritis   . Angina   . CHF (congestive heart failure)   . Shortness of breath     "all the time"  . Pneumonia ? 2006  . Renal failure   . ESRD (end stage renal disease) on dialysis 05/25/11    Tu, Th, Sa  . Blood transfusion   . Anemia   . Stomach ulcer   . Constipated   . Seizures     last seizure 05/21/11  . Pacemaker     AV paced    HOSPITAL COURSE:   58 year old female admitted for AV graft declotting.1  1) AV graft malfunction (in right thigh HERO graft) - thrombectomy with TPA on 11/19/2014 by Dr. Delana Meyer - had HD this morning with no problems - remove temporary HD catheter in left groin prior to DC  2. ESRD:  - continue TTS schedule - followed by nephrology during hospitalization  4. Hypotension: chronic  - midodrine, hydrocortisone, sudafed - florinef discontinued by nephrology today   5. Hypothyroidism: continue Synthroid  6. Depression: stable - cont Zoloft  and risperidone  7. Asthma/COPD:  - cont Symbicort (clarify diagnosis) - no exacerbation during hospitalization  9. GERD: stable - continue pantoprazole  10 Hemmorrhoids; - started on Anusol suppository.  11. Seizure disorder;continue Lamictal,keppra  DISCHARGE CONDITIONS:   fair  CONSULTS OBTAINED:  Treatment Team:  Katha Cabal, MD Lavonia Dana, MD  DRUG ALLERGIES:   Allergies  Allergen Reactions  . Iodinated Diagnostic Agents Hives  . Iodine     Other reaction(s): Other (See Comments) Other Reaction: contrast-itching and flushing  . Methylprednisolone Hives  . Other Itching    Other reaction(s): Other (See Comments) Uncoded Allergy. Allergen: iv contrast dye, Other Reaction: flushing Arthritis medications Other reaction(s): Other (See Comments) Uncoded Allergy. Allergen: iv contrast dye, Other Reaction: flushing Arthritis medications  . Pollen Extract Hives  . Trimethadione     Other reaction(s): Other (See Comments) Other Reaction: Other reaction  . Trazodone And Nefazodone Hives and Rash    DISCHARGE MEDICATIONS:   Current Discharge Medication List    START taking these medications   Details  pseudoephedrine (SUDAFED) 30 MG tablet Take 1 tablet (30 mg total) by mouth daily. Qty: 30 tablet, Refills: 0      CONTINUE these medications which have NOT CHANGED   Details  acetaminophen (TYLENOL) 325 MG tablet Take 650  mg by mouth every 6 (six) hours as needed for mild pain.    ALPRAZolam (XANAX) 0.5 MG tablet     antiseptic oral rinse (BIOTENE) LIQD 10 mLs by Mouth Rinse route as needed for dry mouth.    bisacodyl (DULCOLAX) 5 MG EC tablet Take 2 tablets (10 mg total) by mouth daily as needed for moderate constipation. Qty: 30 tablet, Refills: 0    calcitRIOL (ROCALTROL) 0.25 MCG capsule Take 1 capsule (0.25 mcg total) by mouth Every Tuesday,Thursday,and Saturday with dialysis. Qty: 30 capsule, Refills: 0    calcium acetate (PHOSLO) 667 MG  capsule Take 2,001 mg by mouth 3 (three) times daily.     Darbepoetin Alfa (ARANESP) 200 MCG/0.4ML SOSY injection Inject 0.4 mLs (200 mcg total) into the vein every Tuesday with hemodialysis. Qty: 1.68 mL, Refills: 0    ferric gluconate 125 mg in sodium chloride 0.9 % 100 mL Inject 125 mg into the vein every Tuesday with hemodialysis. Qty: 125 mg, Refills: 0    fexofenadine (ALLEGRA) 180 MG tablet Take 180 mg by mouth daily. For allergies    gabapentin (NEURONTIN) 100 MG capsule Take 100 mg by mouth 2 (two) times daily.    hydrocortisone (ANUSOL-HC) 25 MG suppository Place 1 suppository (25 mg total) rectally 2 (two) times daily. Qty: 12 suppository, Refills: 0    !! hydrocortisone (CORTEF) 10 MG tablet Refills: 0    !! hydrocortisone (CORTEF) 5 MG tablet     lamoTRIgine (LAMICTAL) 25 MG tablet Take 50 mg by mouth at bedtime.     !! levETIRAcetam (KEPPRA) 1000 MG tablet Sun/Mon/Wen/Fri Qty: 30 tablet, Refills: 0    !! levETIRAcetam (KEPPRA) 250 MG tablet     !! levETIRAcetam (KEPPRA) 750 MG tablet Tue/Th/Sat Qty: 60 tablet, Refills: 0    levothyroxine (SYNTHROID, LEVOTHROID) 50 MCG tablet Take 1 tablet (50 mcg total) by mouth daily before breakfast. Qty: 30 tablet, Refills: 0    metroNIDAZOLE (METROCREAM) 0.75 % cream Apply vaginally if pt is still symptomatic    midodrine (PROAMATINE) 10 MG tablet Take 2 tablets (20 mg total) by mouth 3 (three) times daily. Qty: 180 tablet, Refills: 0    pantoprazole (PROTONIX) 40 MG tablet Take 1 tablet (40 mg total) by mouth 2 (two) times daily. Qty: 30 tablet, Refills: 6    polyethylene glycol (MIRALAX / GLYCOLAX) packet Take 17 g by mouth 2 (two) times daily.    Propylene Glycol 0.6 % SOLN Apply 1 drop to eye 2 (two) times daily.    RENVELA 800 MG tablet     RESTASIS 0.05 % ophthalmic emulsion Place 1 drop into both eyes 2 (two) times daily.     risperiDONE (RISPERDAL) 0.5 MG tablet Take 0.5 mg by mouth at bedtime.      risperiDONE microspheres (RISPERDAL CONSTA) 37.5 MG injection Inject 37.5 mg into the muscle every 14 (fourteen) days.    senna-docusate (SENOKOT-S) 8.6-50 MG per tablet Take 2 tablets by mouth 2 (two) times daily.     !! sertraline (ZOLOFT) 100 MG tablet Take 100 mg by mouth daily. Take with 50mg  tablet to equal 150mg     !! sertraline (ZOLOFT) 50 MG tablet Take 50 mg by mouth daily. Take with 100mg  tablet to equal 150mg     !! sevelamer (RENAGEL) 800 MG tablet Take 1,600 mg by mouth 3 (three) times daily with meals.     !! sevelamer (RENAGEL) 800 MG tablet Take 1 tablet by mouth daily.    sodium chloride (OCEAN) 0.65 %  SOLN nasal spray Place 1 spray into both nostrils every 8 (eight) hours as needed for congestion.    SYMBICORT 160-4.5 MCG/ACT inhaler Inhale 2 puffs into the lungs 2 (two) times daily.     topiramate (TOPAMAX) 200 MG tablet Take 200 mg by mouth 2 (two) times daily.      traMADol (ULTRAM) 50 MG tablet Take 50 mg by mouth every 8 (eight) hours as needed (pain).     triamcinolone (KENALOG) 0.025 % cream Apply to affected area    zolpidem (AMBIEN) 5 MG tablet Take 1 tablet (5 mg total) by mouth at bedtime as needed for sleep. Qty: 30 tablet, Refills: 0     !! - Potential duplicate medications found. Please discuss with provider.    STOP taking these medications     fludrocortisone (FLORINEF) 0.1 MG tablet          DISCHARGE INSTRUCTIONS:    Discharge to [facility].   DIET:  Renal diet  DISCHARGE CONDITION:  Stable  ACTIVITY:  Activity as tolerated  OXYGEN:  Home Oxygen: No.   Oxygen Delivery: room air  DISCHARGE LOCATION:  nursing home   If you experience worsening of your admission symptoms, develop shortness of breath, life threatening emergency, suicidal or homicidal thoughts you must seek medical attention immediately by calling 911 or calling your MD immediately  if symptoms less severe.  You Must read complete  instructions/literature along with all the possible adverse reactions/side effects for all the Medicines you take and that have been prescribed to you. Take any new Medicines after you have completely understood and accpet all the possible adverse reactions/side effects.   Please note  You were cared for by a hospitalist during your hospital stay. If you have any questions about your discharge medications or the care you received while you were in the hospital after you are discharged, you can call the unit and asked to speak with the hospitalist on call if the hospitalist that took care of you is not available. Once you are discharged, your primary care physician will handle any further medical issues. Please note that NO REFILLS for any discharge medications will be authorized once you are discharged, as it is imperative that you return to your primary care physician (or establish a relationship with a primary care physician if you do not have one) for your aftercare needs so that they can reassess your need for medications and monitor your lab values.  Today   CHIEF COMPLAINT:   Chief Complaint  Patient presents with  . Vascular Access Problem    HISTORY OF PRESENT ILLNESS:  [name] is a 59 y.o. female with a known history of dialysis access failures who presents to the emergency department after being unable to undergo dialysis this morning due to a clotted AV graft. The patient denies pain or fevers. She has not had any chest pain shortness of breath nausea vomiting or diarrhea. Prior to admission the emergency department staff spoke with vascular surgery who stated the patient will be placed on the operating room schedule for tomorrow morning. Thus the emergency department staff called for admission and management of her medical issues prior to her procedure.   VITAL SIGNS:  Blood pressure 77/34, pulse 60, temperature 97.7 F (36.5 C), temperature source Oral, resp. rate 20,  height 5\' 6"  (1.676 m), weight 147.8 kg (325 lb 13.4 oz), SpO2 93 %.  I/O:   Intake/Output Summary (Last 24 hours) at 11/20/14 1411 Last data  filed at 11/20/14 1314  Gross per 24 hour  Intake      0 ml  Output   1500 ml  Net  -1500 ml    PHYSICAL EXAMINATION:  Physical Exam  Constitutional: She is oriented to person, place, and time and well-developed, well-nourished, and in no distress.  obese  HENT:  Head: Normocephalic and atraumatic.  Eyes: Pupils are equal, round, and reactive to light. Right eye exhibits no discharge. Left eye exhibits no discharge. No scleral icterus.  Neck: Normal range of motion. Neck supple. No tracheal deviation present.  Cardiovascular: Normal rate and regular rhythm.   No murmur heard. Pulmonary/Chest: Effort normal and breath sounds normal. No respiratory distress. She exhibits no tenderness.  Abdominal: Soft. Bowel sounds are normal. She exhibits no distension. There is no tenderness.  Musculoskeletal: She exhibits no edema.  Lymphadenopathy:    She has no cervical adenopathy.  Neurological: She is oriented to person, place, and time.  Skin: Skin is warm and dry.  Clean dry bandages over right thigh graft  Psychiatric: Affect normal.    DATA REVIEW:   CBC  Recent Labs Lab 11/20/14 1110  WBC 4.0  HGB 10.4*  HCT 32.2*  PLT 112*    Chemistries   Recent Labs Lab 11/16/14 1503 11/17/14 0513  11/20/14 1110  NA 137 137  < > 128*  K 6.8* 5.8*  < > 4.8  CL 100* 97*  < > 88*  CO2 23 26  < > 27  GLUCOSE 97 93  < > 115*  BUN 77* 81*  < > 54*  CREATININE 9.12* 10.07*  < > 7.08*  CALCIUM 8.9 8.9  < > 8.9  MG  --  2.4  --   --   AST 25  --   --   --   ALT 14  --   --   --   ALKPHOS 303*  --   --   --   BILITOT 1.4*  --   --   --   < > = values in this interval not displayed.  Cardiac Enzymes No results for input(s): TROPONINI in the last 168 hours.  Microbiology Results  Results for orders placed or performed during the hospital  encounter of 10/19/14  Culture, blood (single)     Status: None   Collection Time: 10/19/14  5:09 PM  Result Value Ref Range Status   Micro Text Report   Final       COMMENT                   NO GROWTH AEROBICALLY/ANAEROBICALLY IN 5 DAYS   ANTIBIOTIC                                                      Culture, blood (single)     Status: None   Collection Time: 10/19/14  5:09 PM  Result Value Ref Range Status   Micro Text Report   Final       COMMENT                   NO GROWTH AEROBICALLY/ANAEROBICALLY IN 5 DAYS   ANTIBIOTIC  RADIOLOGY:  No results found.  EKG:   Orders placed or performed during the hospital encounter of 11/16/14  . ED EKG  . ED EKG  . EKG 12-Lead  . EKG 12-Lead      Management plans discussed with the patient, family and they are in agreement.  CODE STATUS:     Code Status Orders        Start     Ordered   11/16/14 2017  Full code   Continuous     11/16/14 2016      TOTAL TIME TAKING CARE OF THIS PATIENT: 40 minutes.    Myrtis Ser M.D on 11/20/2014 at 2:11 PM  Between 7am to 6pm - Pager - [phone]  After 6pm go to [email or web address] - password [email or web address] [facility]  Office  [phone]  CC: Primary care physician; Alvester Morin, MD

## 2014-11-20 NOTE — Progress Notes (Signed)
This note also relates to the following rows which could not be included: Resp - Cannot attach notes to unvalidated device data   Pre-hd tx

## 2014-11-20 NOTE — Discharge Instructions (Signed)
°  DIET:  °Renal diet ° °DISCHARGE CONDITION:  °Stable ° °ACTIVITY:  °Activity as tolerated ° °OXYGEN:  °Home Oxygen: No. °  °Oxygen Delivery: room air ° °DISCHARGE LOCATION:  °home  ° °If you experience worsening of your admission symptoms, develop shortness of breath, life threatening emergency, suicidal or homicidal thoughts you must seek medical attention immediately by calling 911 or calling your MD immediately  if symptoms less severe. ° °You Must read complete instructions/literature along with all the possible adverse reactions/side effects for all the Medicines you take and that have been prescribed to you. Take any new Medicines after you have completely understood and accpet all the possible adverse reactions/side effects.  ° °Please note ° °You were cared for by a hospitalist during your hospital stay. If you have any questions about your discharge medications or the care you received while you were in the hospital after you are discharged, you can call the unit and asked to speak with the hospitalist on call if the hospitalist that took care of you is not available. Once you are discharged, your primary care physician will handle any further medical issues. Please note that NO REFILLS for any discharge medications will be authorized once you are discharged, as it is imperative that you return to your primary care physician (or establish a relationship with a primary care physician if you do not have one) for your aftercare needs so that they can reassess your need for medications and monitor your lab values. ° ° ° °

## 2014-11-25 ENCOUNTER — Encounter: Payer: Self-pay | Admitting: Vascular Surgery

## 2014-11-25 LAB — POTASSIUM (ARMC VASCULAR LAB ONLY): Potassium (ARMC vascular lab): 5.2

## 2014-12-14 ENCOUNTER — Encounter: Payer: Self-pay | Admitting: Vascular Surgery

## 2014-12-16 ENCOUNTER — Emergency Department (HOSPITAL_COMMUNITY): Payer: Medicare Other

## 2014-12-16 ENCOUNTER — Other Ambulatory Visit (HOSPITAL_COMMUNITY): Payer: Self-pay

## 2014-12-16 ENCOUNTER — Inpatient Hospital Stay (HOSPITAL_COMMUNITY)
Admission: EM | Admit: 2014-12-16 | Discharge: 2014-12-23 | DRG: 871 | Disposition: A | Discharge status: Skilled Nursing Facility | Payer: Medicare Other | Attending: Internal Medicine | Admitting: Internal Medicine

## 2014-12-16 ENCOUNTER — Encounter (HOSPITAL_COMMUNITY): Payer: Self-pay

## 2014-12-16 DIAGNOSIS — R109 Unspecified abdominal pain: Secondary | ICD-10-CM

## 2014-12-16 DIAGNOSIS — Z8614 Personal history of Methicillin resistant Staphylococcus aureus infection: Secondary | ICD-10-CM

## 2014-12-16 DIAGNOSIS — I959 Hypotension, unspecified: Secondary | ICD-10-CM

## 2014-12-16 DIAGNOSIS — K219 Gastro-esophageal reflux disease without esophagitis: Secondary | ICD-10-CM | POA: Diagnosis present

## 2014-12-16 DIAGNOSIS — Q851 Tuberous sclerosis: Secondary | ICD-10-CM

## 2014-12-16 DIAGNOSIS — F329 Major depressive disorder, single episode, unspecified: Secondary | ICD-10-CM | POA: Diagnosis present

## 2014-12-16 DIAGNOSIS — Z7952 Long term (current) use of systemic steroids: Secondary | ICD-10-CM

## 2014-12-16 DIAGNOSIS — K432 Incisional hernia without obstruction or gangrene: Secondary | ICD-10-CM | POA: Diagnosis present

## 2014-12-16 DIAGNOSIS — Z950 Presence of cardiac pacemaker: Secondary | ICD-10-CM

## 2014-12-16 DIAGNOSIS — F79 Unspecified intellectual disabilities: Secondary | ICD-10-CM | POA: Diagnosis present

## 2014-12-16 DIAGNOSIS — G40909 Epilepsy, unspecified, not intractable, without status epilepticus: Secondary | ICD-10-CM | POA: Diagnosis present

## 2014-12-16 DIAGNOSIS — Z992 Dependence on renal dialysis: Secondary | ICD-10-CM

## 2014-12-16 DIAGNOSIS — R6521 Severe sepsis with septic shock: Secondary | ICD-10-CM | POA: Diagnosis present

## 2014-12-16 DIAGNOSIS — E2740 Unspecified adrenocortical insufficiency: Secondary | ICD-10-CM | POA: Diagnosis present

## 2014-12-16 DIAGNOSIS — A419 Sepsis, unspecified organism: Secondary | ICD-10-CM

## 2014-12-16 DIAGNOSIS — M898X9 Other specified disorders of bone, unspecified site: Secondary | ICD-10-CM | POA: Diagnosis present

## 2014-12-16 DIAGNOSIS — Z79899 Other long term (current) drug therapy: Secondary | ICD-10-CM

## 2014-12-16 DIAGNOSIS — A491 Streptococcal infection, unspecified site: Secondary | ICD-10-CM | POA: Insufficient documentation

## 2014-12-16 DIAGNOSIS — N2581 Secondary hyperparathyroidism of renal origin: Secondary | ICD-10-CM | POA: Diagnosis present

## 2014-12-16 DIAGNOSIS — E669 Obesity, unspecified: Secondary | ICD-10-CM | POA: Diagnosis present

## 2014-12-16 DIAGNOSIS — B954 Other streptococcus as the cause of diseases classified elsewhere: Secondary | ICD-10-CM | POA: Insufficient documentation

## 2014-12-16 DIAGNOSIS — R7881 Bacteremia: Secondary | ICD-10-CM | POA: Insufficient documentation

## 2014-12-16 DIAGNOSIS — Z7951 Long term (current) use of inhaled steroids: Secondary | ICD-10-CM

## 2014-12-16 DIAGNOSIS — L03115 Cellulitis of right lower limb: Secondary | ICD-10-CM | POA: Diagnosis present

## 2014-12-16 DIAGNOSIS — T8130XA Disruption of wound, unspecified, initial encounter: Secondary | ICD-10-CM | POA: Diagnosis present

## 2014-12-16 DIAGNOSIS — A400 Sepsis due to streptococcus, group A: Principal | ICD-10-CM | POA: Diagnosis present

## 2014-12-16 DIAGNOSIS — R079 Chest pain, unspecified: Secondary | ICD-10-CM

## 2014-12-16 DIAGNOSIS — I5032 Chronic diastolic (congestive) heart failure: Secondary | ICD-10-CM | POA: Diagnosis present

## 2014-12-16 DIAGNOSIS — D638 Anemia in other chronic diseases classified elsewhere: Secondary | ICD-10-CM | POA: Diagnosis present

## 2014-12-16 DIAGNOSIS — G4733 Obstructive sleep apnea (adult) (pediatric): Secondary | ICD-10-CM | POA: Diagnosis present

## 2014-12-16 DIAGNOSIS — L03119 Cellulitis of unspecified part of limb: Secondary | ICD-10-CM

## 2014-12-16 DIAGNOSIS — E039 Hypothyroidism, unspecified: Secondary | ICD-10-CM | POA: Diagnosis present

## 2014-12-16 DIAGNOSIS — Z6841 Body Mass Index (BMI) 40.0 and over, adult: Secondary | ICD-10-CM

## 2014-12-16 DIAGNOSIS — N186 End stage renal disease: Secondary | ICD-10-CM | POA: Diagnosis present

## 2014-12-16 HISTORY — DX: Essential (primary) hypertension: I10

## 2014-12-16 LAB — COMPREHENSIVE METABOLIC PANEL
ALT: 13 U/L — AB (ref 14–54)
AST: 24 U/L (ref 15–41)
Albumin: 3.1 g/dL — ABNORMAL LOW (ref 3.5–5.0)
Alkaline Phosphatase: 206 U/L — ABNORMAL HIGH (ref 38–126)
Anion gap: 15 (ref 5–15)
BILIRUBIN TOTAL: 0.7 mg/dL (ref 0.3–1.2)
BUN: 28 mg/dL — ABNORMAL HIGH (ref 6–20)
CHLORIDE: 97 mmol/L — AB (ref 101–111)
CO2: 27 mmol/L (ref 22–32)
Calcium: 8.8 mg/dL — ABNORMAL LOW (ref 8.9–10.3)
Creatinine, Ser: 4.61 mg/dL — ABNORMAL HIGH (ref 0.44–1.00)
GFR calc Af Amer: 11 mL/min — ABNORMAL LOW (ref >60)
GFR calc non Af Amer: 10 mL/min — ABNORMAL LOW (ref >60)
GLUCOSE: 101 mg/dL — AB (ref 65–99)
POTASSIUM: 5.1 mmol/L (ref 3.5–5.1)
Sodium: 139 mmol/L (ref 135–145)
Total Protein: 6.5 g/dL (ref 6.5–8.1)

## 2014-12-16 LAB — I-STAT ARTERIAL BLOOD GAS, ED
ACID-BASE EXCESS: 4 mmol/L — AB (ref 0.0–2.0)
BICARBONATE: 28.3 meq/L — AB (ref 20.0–24.0)
O2 SAT: 97 %
TCO2: 29 mmol/L (ref 0–100)
pCO2 arterial: 38.8 mmHg (ref 35.0–45.0)
pH, Arterial: 7.471 — ABNORMAL HIGH (ref 7.350–7.450)
pO2, Arterial: 86 mmHg (ref 80.0–100.0)

## 2014-12-16 LAB — CBC WITH DIFFERENTIAL/PLATELET
Basophils Absolute: 0 10*3/uL (ref 0.0–0.1)
Basophils Relative: 0 % (ref 0–1)
EOS ABS: 0 10*3/uL (ref 0.0–0.7)
Eosinophils Relative: 0 % (ref 0–5)
HCT: 33 % — ABNORMAL LOW (ref 36.0–46.0)
Hemoglobin: 10 g/dL — ABNORMAL LOW (ref 12.0–15.0)
LYMPHS ABS: 0.8 10*3/uL (ref 0.7–4.0)
Lymphocytes Relative: 7 % — ABNORMAL LOW (ref 12–46)
MCH: 32.6 pg (ref 26.0–34.0)
MCHC: 30.3 g/dL (ref 30.0–36.0)
MCV: 107.5 fL — ABNORMAL HIGH (ref 78.0–100.0)
Monocytes Absolute: 0.5 10*3/uL (ref 0.1–1.0)
Monocytes Relative: 5 % (ref 3–12)
NEUTROS PCT: 88 % — AB (ref 43–77)
Neutro Abs: 9.5 10*3/uL — ABNORMAL HIGH (ref 1.7–7.7)
PLATELETS: 124 10*3/uL — AB (ref 150–400)
RBC: 3.07 MIL/uL — ABNORMAL LOW (ref 3.87–5.11)
RDW: 18.8 % — AB (ref 11.5–15.5)
WBC: 10.9 10*3/uL — ABNORMAL HIGH (ref 4.0–10.5)

## 2014-12-16 LAB — LIPASE, BLOOD: Lipase: 29 U/L (ref 22–51)

## 2014-12-16 LAB — I-STAT CG4 LACTIC ACID, ED
LACTIC ACID, VENOUS: 1.2 mmol/L (ref 0.5–2.0)
Lactic Acid, Venous: 3.31 mmol/L (ref 0.5–2.0)

## 2014-12-16 MED ORDER — PIPERACILLIN-TAZOBACTAM IN DEX 2-0.25 GM/50ML IV SOLN
2.2500 g | Freq: Three times a day (TID) | INTRAVENOUS | Status: DC
  Filled 2014-12-16: qty 50

## 2014-12-16 MED ORDER — SODIUM CHLORIDE 0.9 % IV BOLUS (SEPSIS)
2000.0000 mL | Freq: Once | INTRAVENOUS | Status: AC
  Administered 2014-12-16: 2000 mL via INTRAVENOUS

## 2014-12-16 MED ORDER — PIPERACILLIN-TAZOBACTAM 3.375 G IVPB 30 MIN
3.3750 g | Freq: Once | INTRAVENOUS | Status: AC
  Administered 2014-12-16: 3.375 g via INTRAVENOUS
  Filled 2014-12-16: qty 50

## 2014-12-16 MED ORDER — NOREPINEPHRINE BITARTRATE 1 MG/ML IV SOLN
2.0000 ug/min | INTRAVENOUS | Status: DC
  Administered 2014-12-16: 2 ug/min via INTRAVENOUS
  Filled 2014-12-16: qty 4

## 2014-12-16 MED ORDER — VANCOMYCIN HCL 10 G IV SOLR
2500.0000 mg | INTRAVENOUS | Status: AC
  Administered 2014-12-16: 2500 mg via INTRAVENOUS
  Filled 2014-12-16: qty 2500

## 2014-12-16 NOTE — ED Notes (Signed)
EMS gave 324mg  of ASA, stated she probably ate 162mg  of the 324mg 

## 2014-12-16 NOTE — ED Notes (Signed)
Pt arrived via EMS from [facility].  C/o chest pain.  Pt unable to describe chest pain just states "it was bad, feeling really bad"

## 2014-12-16 NOTE — Progress Notes (Signed)
ANTIBIOTIC CONSULT NOTE - INITIAL  Pharmacy Consult for vancomycin + zosyn Indication: rule out sepsis  Allergies  Allergen Reactions  . Iodinated Diagnostic Agents Hives  . Iodine     Other reaction(s): Other (See Comments) Other Reaction: contrast-itching and flushing  . Methylprednisolone Hives  . Other Itching    Other reaction(s): Other (See Comments) Uncoded Allergy. Allergen: iv contrast dye, Other Reaction: flushing Arthritis medications Other reaction(s): Other (See Comments) Uncoded Allergy. Allergen: iv contrast dye, Other Reaction: flushing Arthritis medications  . Pollen Extract Hives  . Trimethadione     Other reaction(s): Other (See Comments) Other Reaction: Other reaction  . Trazodone And Nefazodone Hives and Rash    Patient Measurements: Height: 5\' 7"  (170.2 cm) Weight: (!) 325 lb (147.419 kg) IBW/kg (Calculated) : 61.6 Adjusted Body Weight:   Vital Signs: BP: 63/30 mmHg (06/02 1900) Pulse Rate: 60 (06/02 1900) Intake/Output from previous day:   Intake/Output from this shift:    Labs: No results for input(s): WBC, HGB, PLT, LABCREA, CREATININE in the last 72 hours. CrCl cannot be calculated (Patient has no serum creatinine result on file.). No results for input(s): VANCOTROUGH, VANCOPEAK, VANCORANDOM, GENTTROUGH, GENTPEAK, GENTRANDOM, TOBRATROUGH, TOBRAPEAK, TOBRARND, AMIKACINPEAK, AMIKACINTROU, AMIKACIN in the last 72 hours.   Microbiology: No results found for this or any previous visit (from the past 720 hour(s)).  Medical History: Past Medical History  Diagnosis Date  . Renal insufficiency   . Mental retardation   . Sleep apnea   . End-stage renal disease on hemodialysis   . Tuberous sclerosis   . Obesity   . GERD (gastroesophageal reflux disease)   . Depression   . Mental retardation   . Sleep apnea     on c-pap  . Hx MRSA infection   . Obesity   . Complication of anesthesia     "sometimes I don't wake up on time; depends on how  much med"  . Asthma   . 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 Osteoarthritis   . Angina   . CHF (congestive heart failure)   . Shortness of breath     "all the time"  . Pneumonia ? 2006  . Renal failure   . ESRD (end stage renal disease) on dialysis 05/25/11    Tu, Th, Sa  . Blood transfusion   . Anemia   . Stomach ulcer   . Constipated   . Seizures     last seizure 05/21/11  . Pacemaker     AV paced    Medications:  Anti-infectives    Start     Dose/Rate Route Frequency Ordered Stop   12/17/14 0400  piperacillin-tazobactam (ZOSYN) IVPB 2.25 g     2.25 g 100 mL/hr over 30 Minutes Intravenous Every 8 hours 12/16/14 1923     12/16/14 1915  piperacillin-tazobactam (ZOSYN) IVPB 3.375 g     3.375 g 100 mL/hr over 30 Minutes Intravenous  Once 12/16/14 1913     12/16/14 1915  vancomycin (VANCOCIN) 2,500 mg in sodium chloride 0.9 % 500 mL IVPB     2,500 mg 250 mL/hr over 120 Minutes Intravenous STAT 12/16/14 1913 12/17/14 1915     Assessment: 38 yof presented to the ED with CP and hypotension. To start empiric vancomycin + zosyn for possible sepsis. No labs or temperature available at this time. Pt is ESRD on HD.  Vanc 6/2>> Zosyn 6/2>>  Goal of Therapy:  Pre- HD Vancomycin level 15-25 mcg/ml  Plan:  - Zosyn 3.375gm IV x 1  then 2.25gm IV Q8H - Vanc 2500mg  IV x 1 - f/u renal plans for maintenance doses - F/u renal plans, C&S, clinical status and pre-HD level at [facility], Rande Lawman 12/16/2014,7:24 PM

## 2014-12-16 NOTE — ED Notes (Signed)
MD notified of Patient BP 57/23 Fluids are rull wide open Patient alert & O x4.

## 2014-12-16 NOTE — ED Notes (Signed)
Patient unable to make urine MD aware states okay not to get Cath

## 2014-12-16 NOTE — ED Provider Notes (Signed)
CSN: [id number]     Arrival date & time 12/16/14  1820 History   First MD Initiated Contact with Patient 12/16/14 1822     Chief Complaint  Patient presents with  . Chest Pain     (Consider location/radiation/quality/duration/timing/severity/associated sxs/prior Treatment) HPI Comments: EMS called out for hypotension. Per the facility her last dialysis was yesterday. Patient states her last dialysis is today. Patient states she feels that all over and just feels terrible. She said this started a week ago and is progressively worsened. Facility noted hypotension today. There is concern for a STEMI and round, so she was diverted from [facility] here to [facility].  History is difficult because of mental retardation. She is alert and knows the year and where she is.  Patient is a 59 y.o. female presenting with chest pain and general illness. The history is provided by the patient and the EMS personnel. The history is limited by the condition of the patient and a developmental delay.  Chest Pain Pain location:  Substernal area Pain quality: aching   Pain radiates to:  Does not radiate Pain severity:  Moderate Onset quality:  Gradual Associated symptoms: shortness of breath   Associated symptoms: no cough and no fever   Illness Location:  General malaise Quality:  "feeling bad" Severity:  Moderate Onset quality:  Gradual Timing:  Constant Progression:  Unchanged Chronicity:  New Associated symptoms: chest pain and shortness of breath   Associated symptoms: no cough and no fever     Past Medical History  Diagnosis Date  . Renal insufficiency   . Mental retardation   . Sleep apnea   . End-stage renal disease on hemodialysis   . Tuberous sclerosis   . Obesity   . GERD (gastroesophageal reflux disease)   . Depression   . Mental retardation   . Sleep apnea     on c-pap  . Hx MRSA infection   . Obesity   . Complication of anesthesia     "sometimes I don't  wake up on time; depends on how much med"  . Asthma   . 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 Osteoarthritis   . Angina   . CHF (congestive heart failure)   . Shortness of breath     "all the time"  . Pneumonia ? 2006  . Renal failure   . ESRD (end stage renal disease) on dialysis 05/25/11    Tu, Th, Sa  . Blood transfusion   . Anemia   . Stomach ulcer   . Constipated   . Seizures     last seizure 05/21/11  . Pacemaker     AV paced   Past Surgical History  Procedure Laterality Date  . Uterine tumor  05/25/11    pt denies this history  . Nephrectomy  01/08/1994; 01/15/2006    left; right  . Shunt in arm for dialysis      right   . Thrombectomy  05/2010    shunt in my right arm  . Cholecystectomy  07/02/1990  . Tonsillectomy  03/03/1978  . Avgg removal  06-26-11    Right arm AVG removed.  . Pacemaker placement    . Vas ins tunn cv cath 5 or older ([facility] hx)      both legs  . Av fistula placement      L  . Arteriovenous graft placement      R arm  . Peripheral vascular catheterization Left 11/19/2014    Procedure: Thrombectomy;  Surgeon: Katha Cabal, MD;  Location: [place] [facility];  Service: Cardiovascular;  Laterality: Left;  . Peripheral vascular catheterization Right 11/19/2014    Procedure: A/V Shuntogram/Fistulagram;  Surgeon: Katha Cabal, MD;  Location: [place] [facility];  Service: Cardiovascular;  Laterality: Right;  . Peripheral vascular catheterization Left 11/17/2014    Procedure: Dialysis/Perma Catheter Insertion;  Surgeon: Katha Cabal, MD;  Location: [place] [facility];  Service: Cardiovascular;  Laterality: Left;   Family History  Problem Relation Age of Onset  . Diabetes Father   . Asthma Son    History  Substance Use Topics  . Smoking status: Never Smoker   . Smokeless tobacco: Never Used  . Alcohol Use: No   OB History    No data available     Review of Systems  Unable to perform ROS: Other  Constitutional: Negative for fever and chills.   Respiratory: Positive for shortness of breath. Negative for cough.   Cardiovascular: Positive for chest pain.  All other systems reviewed and are negative.     Allergies  Iodinated diagnostic agents; Iodine; Methylprednisolone; Other; Pollen extract; Trimethadione; and Trazodone and nefazodone  Home Medications   Prior to Admission medications   Medication Sig Start Date End Date Taking? Authorizing Provider  acetaminophen (TYLENOL) 325 MG tablet Take 650 mg by mouth every 6 (six) hours as needed for mild pain.    Historical Provider, MD  ALPRAZolam Duanne Moron) 0.5 MG tablet  10/14/14   Historical Provider, MD  antiseptic oral rinse (BIOTENE) LIQD 10 mLs by Mouth Rinse route as needed for dry mouth.    Historical Provider, MD  bisacodyl (DULCOLAX) 5 MG EC tablet Take 2 tablets (10 mg total) by mouth daily as needed for moderate constipation. 09/14/14   Allie Bossier, MD  calcitRIOL (ROCALTROL) 0.25 MCG capsule Take 1 capsule (0.25 mcg total) by mouth Every Tuesday,Thursday,and Saturday with dialysis. 09/14/14   Allie Bossier, MD  calcium acetate (PHOSLO) 667 MG capsule Take 2,001 mg by mouth 3 (three) times daily.     Historical Provider, MD  Darbepoetin Alfa (ARANESP) 200 MCG/0.4ML SOSY injection Inject 0.4 mLs (200 mcg total) into the vein every Tuesday with hemodialysis. 09/14/14   Allie Bossier, MD  ferric gluconate 125 mg in sodium chloride 0.9 % 100 mL Inject 125 mg into the vein every Tuesday with hemodialysis. 09/14/14   Allie Bossier, MD  fexofenadine (ALLEGRA) 180 MG tablet Take 180 mg by mouth daily. For allergies    Historical Provider, MD  gabapentin (NEURONTIN) 100 MG capsule Take 100 mg by mouth 2 (two) times daily.    Historical Provider, MD  hydrocortisone (ANUSOL-HC) 25 MG suppository Place 1 suppository (25 mg total) rectally 2 (two) times daily. 09/14/14   Allie Bossier, MD  hydrocortisone (CORTEF) 10 MG tablet  09/17/14   Historical Provider, MD  hydrocortisone (CORTEF) 5 MG  tablet  10/22/14   Historical Provider, MD  lamoTRIgine (LAMICTAL) 25 MG tablet Take 50 mg by mouth at bedtime.  05/07/14   Historical Provider, MD  levETIRAcetam (KEPPRA) 1000 MG tablet Sun/Mon/Wen/Fri 09/14/14   Allie Bossier, MD  levETIRAcetam (KEPPRA) 250 MG tablet  08/27/14   Historical Provider, MD  levETIRAcetam (KEPPRA) 750 MG tablet Tue/Th/Sat 09/14/14   Allie Bossier, MD  levothyroxine (SYNTHROID, LEVOTHROID) 50 MCG tablet Take 1 tablet (50 mcg total) by mouth daily before breakfast. 09/14/14   Allie Bossier, MD  metroNIDAZOLE (METROCREAM) 0.75 % cream  Apply vaginally if pt is still symptomatic 11/12/14   Historical Provider, MD  midodrine (PROAMATINE) 10 MG tablet Take 2 tablets (20 mg total) by mouth 3 (three) times daily. 09/14/14   Allie Bossier, MD  pantoprazole (PROTONIX) 40 MG tablet Take 1 tablet (40 mg total) by mouth 2 (two) times daily. 09/14/14 09/14/15  Allie Bossier, MD  polyethylene glycol [facility] Floria Raveling) packet Take 17 g by mouth 2 (two) times daily.    Historical Provider, MD  Propylene Glycol 0.6 % SOLN Apply 1 drop to eye 2 (two) times daily.    Historical Provider, MD  pseudoephedrine (SUDAFED) 30 MG tablet Take 1 tablet (30 mg total) by mouth daily. 11/20/14   Aldean Jewett, MD  RENVELA 800 MG tablet  08/27/14   Historical Provider, MD  RESTASIS 0.05 % ophthalmic emulsion Place 1 drop into both eyes 2 (two) times daily.  06/01/14   Historical Provider, MD  risperiDONE (RISPERDAL) 0.5 MG tablet Take 0.5 mg by mouth at bedtime.  06/04/14   Historical Provider, MD  risperiDONE microspheres (RISPERDAL CONSTA) 37.5 MG injection Inject 37.5 mg into the muscle every 14 (fourteen) days.    Historical Provider, MD  senna-docusate (SENOKOT-S) 8.6-50 MG per tablet Take 2 tablets by mouth 2 (two) times daily.     Historical Provider, MD  sertraline (ZOLOFT) 100 MG tablet Take 100 mg by mouth daily. Take with 50mg  tablet to equal 150mg     Historical Provider, MD  sertraline (ZOLOFT) 50  MG tablet Take 50 mg by mouth daily. Take with 100mg  tablet to equal 150mg     Historical Provider, MD  sevelamer (RENAGEL) 800 MG tablet Take 1,600 mg by mouth 3 (three) times daily with meals.     Historical Provider, MD  sevelamer (RENAGEL) 800 MG tablet Take 1 tablet by mouth daily.    Historical Provider, MD  sodium chloride (OCEAN) 0.65 % SOLN nasal spray Place 1 spray into both nostrils every 8 (eight) hours as needed for congestion.    Historical Provider, MD  SYMBICORT 160-4.5 MCG/ACT inhaler Inhale 2 puffs into the lungs 2 (two) times daily.  05/25/14   Historical Provider, MD  topiramate (TOPAMAX) 200 MG tablet Take 200 mg by mouth 2 (two) times daily.      Historical Provider, MD  traMADol (ULTRAM) 50 MG tablet Take 50 mg by mouth every 8 (eight) hours as needed (pain).     Historical Provider, MD  triamcinolone (KENALOG) 0.025 % cream Apply to affected area 09/15/14   Historical Provider, MD  zolpidem (AMBIEN) 5 MG tablet Take 1 tablet (5 mg total) by mouth at bedtime as needed for sleep. 09/14/14   Allie Bossier, MD   BP 79/46 mmHg  Pulse 60  Resp 20  Ht 5\' 7"  (1.702 m)  Wt 325 lb (147.419 kg)  BMI 50.89 kg/m2  SpO2 98% Physical Exam  Constitutional: She is oriented to person, place, and time. She appears well-developed and well-nourished. No distress.  HENT:  Head: Normocephalic and atraumatic.  Mouth/Throat: Oropharynx is clear and moist.  Eyes: EOM are normal. Pupils are equal, round, and reactive to light.  Neck: Normal range of motion. Neck supple.  Cardiovascular: Normal rate and regular rhythm.  Exam reveals no friction rub.   No murmur heard. Pulmonary/Chest: Effort normal and breath sounds normal. No respiratory distress. She has no wheezes. She has no rales.  Abdominal: Soft. She exhibits no distension. There is no tenderness. There is no rebound.  Musculoskeletal: Normal range of motion. She exhibits no edema.  Neurological: She is alert and oriented to person,  place, and time.  Skin: She is not diaphoretic.  Nursing note and vitals reviewed.   ED Course  Procedures (including critical care time) Labs Review Labs Reviewed  CULTURE, BLOOD (ROUTINE X 2)  CULTURE, BLOOD (ROUTINE X 2)  URINE CULTURE  CULTURE, BLOOD (ROUTINE X 2)  CULTURE, BLOOD (ROUTINE X 2)  COMPREHENSIVE METABOLIC PANEL  CBC WITH DIFFERENTIAL/PLATELET  URINALYSIS, ROUTINE W REFLEX MICROSCOPIC (NOT AT [facility])  LIPASE, BLOOD  AMMONIA  BLOOD GAS, ARTERIAL  I-STAT CG4 LACTIC ACID, ED    Imaging Review Dg Chest Port 1 View  12/16/2014   CLINICAL DATA:  Shortness of breath.  Chest pain.  EXAM: PORTABLE CHEST - 1 VIEW  COMPARISON:  10/20/2014  FINDINGS: Mild enlargement of the cardiopericardial silhouette with pulmonary venous hypertension and indistinct pulmonary vasculature. Mild airway thickening centrally. No airspace opacity identified. Faint peripheral interstitial edema is suggested.  IMPRESSION: 1. Cardiomegaly with indistinct pulmonary vasculature and faint Kerley B-lines suggesting interstitial edema.   Electronically Signed   By: Van Clines M.D.   On: 12/16/2014 19:58     EKG Interpretation   Date/Time:  Thursday December 16 2014 18:27:29 EDT Ventricular Rate:  76 PR Interval:  163 QRS Duration: 177 QT Interval:  490 QTC Calculation: 551 R Axis:   141 Text Interpretation:  Ventricular-paced rhythm No further analysis  attempted due to paced rhythm No significant change since last tracing  Confirmed by Mingo Amber  MD, [place] ([id number]) on 12/16/2014 6:54:35 PM      CRITICAL CARE Performed by: Osvaldo Shipper   Total critical care time: 60 minutes  Critical care time was exclusive of separately billable procedures and treating other patients.  Critical care was necessary to treat or prevent imminent or life-threatening deterioration.  Critical care was time spent personally by me on the following activities: development of treatment plan with patient and/or  surrogate as well as nursing, discussions with consultants, evaluation of patient's response to treatment, examination of patient, obtaining history from patient or surrogate, ordering and performing treatments and interventions, ordering and review of laboratory studies, ordering and review of radiographic studies, pulse oximetry and re-evaluation of patient's condition.  MDM   Final diagnoses:  Chest pain  Hypotension, unspecified hypotension type  Abdominal pain    5 rolled female with history of end-stage renal disease, mental retardation, CHF is make her presents with general malaise, hypotension. Here she is hypotensive in the 70s. She did have dialysis either yesterday or today. 2 lines were established upon arrival and fluids were initiated. Initial lactate is 3.31. Oral temp was 99 3. Her mental retardation makes history difficult. Patient does state some lower abdominal pain. She does not make urine. She has not had any falls. She does endorse some shortness of breath, chest pain, and pain all over. We'll continue with fluids, check labs, and initiate broad-spectrum antibiotics to cover for possible sepsis. 34 - I spoke with Critical Care initially concerning patient's status and decreased reserve. They asked she receive more fluids and for a call back. 2000 - Patient's BP improving with fluids, in the 70s. Patient then had a period of BPs that were elevated and normal, both when checked in arms and legs. Will continue to trend and watch and plan on hopsitalist admission if pressures remain normal. 2100 -BP dropped again. Despite changing cuff and placement, BP still low. Patient mentating, not comatose,  I find it difficult to believe her pressure is truly in the 50s. I feel her prior AV grafts and size make it difficult to obtain a true BP. Lactate cleared. Critical Care consulted to see patient. Levophed started to help with her BP.   Evelina Bucy, MD 12/17/14 [phone]

## 2014-12-17 DIAGNOSIS — F79 Unspecified intellectual disabilities: Secondary | ICD-10-CM | POA: Diagnosis present

## 2014-12-17 DIAGNOSIS — R7881 Bacteremia: Secondary | ICD-10-CM | POA: Diagnosis not present

## 2014-12-17 DIAGNOSIS — I959 Hypotension, unspecified: Secondary | ICD-10-CM | POA: Diagnosis present

## 2014-12-17 DIAGNOSIS — D638 Anemia in other chronic diseases classified elsewhere: Secondary | ICD-10-CM | POA: Diagnosis present

## 2014-12-17 DIAGNOSIS — R6521 Severe sepsis with septic shock: Secondary | ICD-10-CM | POA: Diagnosis not present

## 2014-12-17 DIAGNOSIS — M898X9 Other specified disorders of bone, unspecified site: Secondary | ICD-10-CM | POA: Diagnosis present

## 2014-12-17 DIAGNOSIS — N186 End stage renal disease: Secondary | ICD-10-CM | POA: Diagnosis present

## 2014-12-17 DIAGNOSIS — I5032 Chronic diastolic (congestive) heart failure: Secondary | ICD-10-CM | POA: Diagnosis present

## 2014-12-17 DIAGNOSIS — E669 Obesity, unspecified: Secondary | ICD-10-CM | POA: Diagnosis present

## 2014-12-17 DIAGNOSIS — K432 Incisional hernia without obstruction or gangrene: Secondary | ICD-10-CM | POA: Diagnosis present

## 2014-12-17 DIAGNOSIS — F329 Major depressive disorder, single episode, unspecified: Secondary | ICD-10-CM | POA: Diagnosis present

## 2014-12-17 DIAGNOSIS — Z6841 Body Mass Index (BMI) 40.0 and over, adult: Secondary | ICD-10-CM | POA: Diagnosis not present

## 2014-12-17 DIAGNOSIS — G40909 Epilepsy, unspecified, not intractable, without status epilepticus: Secondary | ICD-10-CM | POA: Diagnosis present

## 2014-12-17 DIAGNOSIS — E039 Hypothyroidism, unspecified: Secondary | ICD-10-CM | POA: Diagnosis present

## 2014-12-17 DIAGNOSIS — T8130XA Disruption of wound, unspecified, initial encounter: Secondary | ICD-10-CM | POA: Diagnosis present

## 2014-12-17 DIAGNOSIS — N179 Acute kidney failure, unspecified: Secondary | ICD-10-CM

## 2014-12-17 DIAGNOSIS — Q851 Tuberous sclerosis: Secondary | ICD-10-CM | POA: Diagnosis not present

## 2014-12-17 DIAGNOSIS — Z8614 Personal history of Methicillin resistant Staphylococcus aureus infection: Secondary | ICD-10-CM | POA: Diagnosis not present

## 2014-12-17 DIAGNOSIS — I890 Lymphedema, not elsewhere classified: Secondary | ICD-10-CM | POA: Diagnosis not present

## 2014-12-17 DIAGNOSIS — Z950 Presence of cardiac pacemaker: Secondary | ICD-10-CM | POA: Diagnosis not present

## 2014-12-17 DIAGNOSIS — E2740 Unspecified adrenocortical insufficiency: Secondary | ICD-10-CM | POA: Diagnosis present

## 2014-12-17 DIAGNOSIS — L03115 Cellulitis of right lower limb: Secondary | ICD-10-CM | POA: Diagnosis not present

## 2014-12-17 DIAGNOSIS — N2581 Secondary hyperparathyroidism of renal origin: Secondary | ICD-10-CM | POA: Diagnosis present

## 2014-12-17 DIAGNOSIS — Z7952 Long term (current) use of systemic steroids: Secondary | ICD-10-CM | POA: Diagnosis not present

## 2014-12-17 DIAGNOSIS — K219 Gastro-esophageal reflux disease without esophagitis: Secondary | ICD-10-CM | POA: Diagnosis present

## 2014-12-17 DIAGNOSIS — Z7951 Long term (current) use of inhaled steroids: Secondary | ICD-10-CM | POA: Diagnosis not present

## 2014-12-17 DIAGNOSIS — G4733 Obstructive sleep apnea (adult) (pediatric): Secondary | ICD-10-CM | POA: Diagnosis present

## 2014-12-17 DIAGNOSIS — A400 Sepsis due to streptococcus, group A: Secondary | ICD-10-CM | POA: Diagnosis present

## 2014-12-17 DIAGNOSIS — Z79899 Other long term (current) drug therapy: Secondary | ICD-10-CM | POA: Diagnosis not present

## 2014-12-17 DIAGNOSIS — A419 Sepsis, unspecified organism: Secondary | ICD-10-CM | POA: Diagnosis not present

## 2014-12-17 DIAGNOSIS — L03119 Cellulitis of unspecified part of limb: Secondary | ICD-10-CM | POA: Diagnosis not present

## 2014-12-17 DIAGNOSIS — Z992 Dependence on renal dialysis: Secondary | ICD-10-CM | POA: Diagnosis not present

## 2014-12-17 LAB — BASIC METABOLIC PANEL
Anion gap: 11 (ref 5–15)
BUN: 32 mg/dL — AB (ref 6–20)
CO2: 28 mmol/L (ref 22–32)
CREATININE: 4.91 mg/dL — AB (ref 0.44–1.00)
Calcium: 9 mg/dL (ref 8.9–10.3)
Chloride: 98 mmol/L — ABNORMAL LOW (ref 101–111)
GFR calc Af Amer: 10 mL/min — ABNORMAL LOW (ref >60)
GFR, EST NON AFRICAN AMERICAN: 9 mL/min — AB (ref >60)
GLUCOSE: 93 mg/dL (ref 65–99)
Potassium: 4.6 mmol/L (ref 3.5–5.1)
Sodium: 137 mmol/L (ref 135–145)

## 2014-12-17 LAB — HEPATIC FUNCTION PANEL
ALBUMIN: 3 g/dL — AB (ref 3.5–5.0)
ALT: 13 U/L — ABNORMAL LOW (ref 14–54)
AST: 22 U/L (ref 15–41)
Alkaline Phosphatase: 208 U/L — ABNORMAL HIGH (ref 38–126)
Bilirubin, Direct: 0.2 mg/dL (ref 0.1–0.5)
Indirect Bilirubin: 0.4 mg/dL (ref 0.3–0.9)
Total Bilirubin: 0.6 mg/dL (ref 0.3–1.2)
Total Protein: 6.6 g/dL (ref 6.5–8.1)

## 2014-12-17 LAB — CBC
HEMATOCRIT: 25.3 % — AB (ref 36.0–46.0)
HEMOGLOBIN: 7.7 g/dL — AB (ref 12.0–15.0)
MCH: 32.6 pg (ref 26.0–34.0)
MCHC: 30.4 g/dL (ref 30.0–36.0)
MCV: 107.2 fL — AB (ref 78.0–100.0)
Platelets: 139 10*3/uL — ABNORMAL LOW (ref 150–400)
RBC: 2.36 MIL/uL — AB (ref 3.87–5.11)
RDW: 18.9 % — ABNORMAL HIGH (ref 11.5–15.5)
WBC: 9.1 10*3/uL (ref 4.0–10.5)

## 2014-12-17 LAB — MRSA PCR SCREENING: MRSA by PCR: POSITIVE — AB

## 2014-12-17 LAB — PROCALCITONIN: Procalcitonin: 12.89 ng/mL

## 2014-12-17 LAB — PHOSPHORUS: Phosphorus: 4.3 mg/dL (ref 2.5–4.6)

## 2014-12-17 LAB — TROPONIN I
TROPONIN I: 0.1 ng/mL — AB (ref <0.031)
Troponin I: 0.09 ng/mL — ABNORMAL HIGH (ref <0.031)

## 2014-12-17 LAB — MAGNESIUM: Magnesium: 2.1 mg/dL (ref 1.7–2.4)

## 2014-12-17 LAB — CORTISOL: Cortisol, Plasma: 15.7 ug/dL

## 2014-12-17 MED ORDER — SERTRALINE HCL 100 MG PO TABS
100.0000 mg | ORAL_TABLET | Freq: Every day | ORAL | Status: DC
  Administered 2014-12-17 – 2014-12-23 (×7): 100 mg via ORAL
  Filled 2014-12-17 (×7): qty 1

## 2014-12-17 MED ORDER — SODIUM CHLORIDE 0.9 % IV SOLN: 250.0000 mL | INTRAVENOUS | Status: DC | PRN

## 2014-12-17 MED ORDER — BUDESONIDE-FORMOTEROL FUMARATE 160-4.5 MCG/ACT IN AERO
2.0000 | INHALATION_SPRAY | Freq: Two times a day (BID) | RESPIRATORY_TRACT | Status: DC
Start: 2014-12-17 — End: 2014-12-17
  Filled 2014-12-17: qty 6

## 2014-12-17 MED ORDER — HEPARIN SODIUM (PORCINE) 5000 UNIT/ML IJ SOLN
5000.0000 [IU] | Freq: Three times a day (TID) | INTRAMUSCULAR | Status: DC
  Administered 2014-12-17 – 2014-12-22 (×13): 5000 [IU] via SUBCUTANEOUS
  Filled 2014-12-17 (×23): qty 1

## 2014-12-17 MED ORDER — CHLORHEXIDINE GLUCONATE CLOTH 2 % EX PADS
6.0000 | MEDICATED_PAD | Freq: Every day | CUTANEOUS | Status: AC
  Administered 2014-12-17 – 2014-12-21 (×5): 6 via TOPICAL

## 2014-12-17 MED ORDER — BUDESONIDE-FORMOTEROL FUMARATE 160-4.5 MCG/ACT IN AERO
2.0000 | INHALATION_SPRAY | Freq: Two times a day (BID) | RESPIRATORY_TRACT | Status: DC
  Administered 2014-12-17 – 2014-12-23 (×13): 2 via RESPIRATORY_TRACT
  Filled 2014-12-17: qty 6

## 2014-12-17 MED ORDER — LEVETIRACETAM 750 MG PO TABS
750.0000 mg | ORAL_TABLET | ORAL | Status: DC
  Administered 2014-12-18 – 2014-12-23 (×3): 750 mg via ORAL
  Filled 2014-12-17 (×3): qty 1

## 2014-12-17 MED ORDER — PANTOPRAZOLE SODIUM 40 MG PO TBEC
40.0000 mg | DELAYED_RELEASE_TABLET | Freq: Two times a day (BID) | ORAL | Status: DC
  Administered 2014-12-17 – 2014-12-23 (×13): 40 mg via ORAL
  Filled 2014-12-17 (×9): qty 1

## 2014-12-17 MED ORDER — SERTRALINE HCL 50 MG PO TABS: 50.0000 mg | ORAL_TABLET | Freq: Every day | ORAL | Status: DC

## 2014-12-17 MED ORDER — MIDODRINE HCL 5 MG PO TABS
20.0000 mg | ORAL_TABLET | Freq: Three times a day (TID) | ORAL | Status: DC
  Administered 2014-12-17 – 2014-12-23 (×20): 20 mg via ORAL
  Filled 2014-12-17 (×22): qty 4

## 2014-12-17 MED ORDER — MUPIROCIN 2 % EX OINT
1.0000 "application " | TOPICAL_OINTMENT | Freq: Two times a day (BID) | CUTANEOUS | Status: AC
  Administered 2014-12-17 – 2014-12-21 (×10): 1 via NASAL
  Filled 2014-12-17 (×2): qty 22

## 2014-12-17 MED ORDER — LAMOTRIGINE 25 MG PO TABS
50.0000 mg | ORAL_TABLET | Freq: Every day | ORAL | Status: DC
  Administered 2014-12-17 – 2014-12-23 (×8): 50 mg via ORAL
  Filled 2014-12-17 (×8): qty 2

## 2014-12-17 MED ORDER — SODIUM CHLORIDE 0.9 % IJ SOLN
10.0000 mL | Freq: Two times a day (BID) | INTRAMUSCULAR | Status: DC
  Administered 2014-12-17: 20 mL
  Administered 2014-12-18: 30 mL

## 2014-12-17 MED ORDER — LEVETIRACETAM 500 MG PO TABS
1000.0000 mg | ORAL_TABLET | ORAL | Status: DC
  Administered 2014-12-17 – 2014-12-22 (×4): 1000 mg via ORAL
  Filled 2014-12-17 (×4): qty 2

## 2014-12-17 MED ORDER — TOPIRAMATE 100 MG PO TABS
200.0000 mg | ORAL_TABLET | Freq: Two times a day (BID) | ORAL | Status: DC
  Administered 2014-12-17 – 2014-12-23 (×14): 200 mg via ORAL
  Filled 2014-12-17 (×15): qty 2

## 2014-12-17 MED ORDER — SERTRALINE HCL 100 MG PO TABS: 100.0000 mg | ORAL_TABLET | Freq: Every day | ORAL | Status: DC

## 2014-12-17 MED ORDER — HYDROCORTISONE NA SUCCINATE PF 100 MG IJ SOLR
100.0000 mg | Freq: Three times a day (TID) | INTRAMUSCULAR | Status: DC
  Administered 2014-12-17 – 2014-12-19 (×8): 100 mg via INTRAVENOUS
  Filled 2014-12-17 (×11): qty 2

## 2014-12-17 MED ORDER — CETYLPYRIDINIUM CHLORIDE 0.05 % MT LIQD
7.0000 mL | Freq: Two times a day (BID) | OROMUCOSAL | Status: DC
  Administered 2014-12-17 – 2014-12-23 (×8): 7 mL via OROMUCOSAL

## 2014-12-17 MED ORDER — SODIUM CHLORIDE 0.9 % IJ SOLN
10.0000 mL | INTRAMUSCULAR | Status: DC | PRN
  Administered 2014-12-20: 10 mL
  Administered 2014-12-23: 30 mL
  Filled 2014-12-17 (×2): qty 40

## 2014-12-17 MED ORDER — SODIUM CHLORIDE 0.9 % IV SOLN
INTRAVENOUS | Status: DC
Start: 2014-12-17 — End: 2014-12-17
  Administered 2014-12-17: 75 mL/h via INTRAVENOUS

## 2014-12-17 MED ORDER — NOREPINEPHRINE BITARTRATE 1 MG/ML IV SOLN
2.0000 ug/min | INTRAVENOUS | Status: DC
  Administered 2014-12-17: 10 ug/min via INTRAVENOUS
  Filled 2014-12-17: qty 16

## 2014-12-17 MED ORDER — LEVOTHYROXINE SODIUM 50 MCG PO TABS
50.0000 ug | ORAL_TABLET | Freq: Every day | ORAL | Status: DC
  Administered 2014-12-17 – 2014-12-22 (×6): 50 ug via ORAL
  Filled 2014-12-17 (×9): qty 1

## 2014-12-17 NOTE — Progress Notes (Signed)
Nutrition Brief Note  Patient identified on the Malnutrition Screening Tool (MST) Report.  Pt asleep at time of visit, unable to arouse. Per RN pt had a long night, stating pt could use some rest. Pt's breakfast tray at bedside, 100% eaten. Per RN pt been complaining about being hungry, has good appetite. Wt history shows 15 Lb wt loss in a month (5%, may be significant for time frame), however pt is on dialysis and was dialyzed right before admission, uncertain what the dry weight is.      Wt Readings from Last 15 Encounters:  12/17/14 310 lb 13.6 oz (141 kg)  11/20/14 325 lb 13.4 oz (147.8 kg)  09/13/14 317 lb 4.8 oz (143.926 kg)  09/08/14 294 lb (133.358 kg)  06/07/14 300 lb (136.079 kg)  07/25/11 291 lb 10.7 oz (132.3 kg)  07/12/11 296 lb (134.265 kg)  07/02/11 293 lb (132.904 kg)  06/26/11 292 lb (132.45 kg)  06/22/11 292 lb (132.45 kg)  05/30/11 290 lb (131.543 kg)  05/25/11 300 lb 0.7 oz (136.1 kg)  05/04/11 296 lb (134.265 kg)  02/27/11 302 lb (136.986 kg)  11/28/10 308 lb (139.708 kg)    Body mass index is 48.67 kg/(m^2). Patient meets criteria for morbid obese based on current BMI.   Current diet order is Renal/CHO Mod, patient is consuming approximately 100% of meals at this time, receiving snacks. Labs and medications reviewed: BUN 32, Cr 4.91   No nutrition interventions warranted at this time. If nutrition issues arise, please consult RD.   [name] A. [place] Dietetic Intern Pager: [phone] 12/17/2014 12:52 PM

## 2014-12-17 NOTE — Progress Notes (Signed)
CRITICAL VALUE ALERT  Critical value received:  Troponin=0.1  Date of notification:  12/17/14  Time of notification:  0712  Critical value read back:yes  Nurse who received alert:  Purcell Nails  MD notified (1st page):  Somner  Time of first page:  [phone]  MD notified (2nd page):  Time of second page:  Responding MD:  Somner  Time MD responded:  [phone]

## 2014-12-17 NOTE — Procedures (Signed)
Central Venous Catheter Insertion Procedure Note [name] [id number] [birth year]  Procedure: Insertion of Central Venous Catheter Indications: Assessment of intravascular volume, Drug and/or fluid administration and Frequent blood sampling  Procedure Details Consent: Risks of procedure as well as the alternatives and risks of each were explained to the (patient/caregiver).  Consent for procedure obtained. Time Out: Verified patient identification, verified procedure, site/side was marked, verified correct patient position, special equipment/implants available, medications/allergies/relevent history reviewed, required imaging and test results available.  Performed  Maximum sterile technique was used including antiseptics, cap, gloves, gown, hand hygiene, mask and sheet. Skin prep: Chlorhexidine; local anesthetic administered A antimicrobial bonded/coated triple lumen catheter was placed in the left femoral vein due to patient being a dialysis patient using the Seldinger technique.  Evaluation Blood flow good Complications: No apparent complications Patient did tolerate procedure well. Chest X-ray ordered to verify placement.  CXR: Not needed.  Procedure performed under direct ultrasound guidance for real time vessel cannulation.      Montey Hora, [place] [facility] Pager: [phone]  or [phone] 12/17/2014, 4:29 AM   Baltazar Apo, MD, PhD 12/17/2014, 10:08 AM [place] [facility] [phone] or if no answer [phone]

## 2014-12-17 NOTE — Procedures (Signed)
Arterial Catheter Insertion Procedure Note [name] [id number] 11-22-1955  Procedure: Insertion of Arterial Catheter  Indications: Blood pressure monitoring and Frequent blood sampling  Procedure Details Consent: Risks of procedure as well as the alternatives and risks of each were explained to the (patient/caregiver).  Consent for procedure obtained. Time Out: Verified patient identification, verified procedure, site/side was marked, verified correct patient position, special equipment/implants available, medications/allergies/relevent history reviewed, required imaging and test results available.  Performed  Maximum sterile technique was used including antiseptics, cap, gloves, gown, hand hygiene, mask and sheet. Skin prep: Chlorhexidine; local anesthetic administered 20 gauge catheter was inserted into left femoral artery using the Seldinger technique.  Evaluation Blood flow good; BP tracing good. Complications: No apparent complications.  Procedure performed under direct ultrasound guidance for real time vessel cannulation.      Montey Hora, [place] [facility] Pager: [phone]  or [phone] 12/17/2014, 4:31 AM  Baltazar Apo, MD, PhD 12/17/2014, 10:08 AM [place] [facility] [phone] or if no answer [phone]

## 2014-12-17 NOTE — ED Notes (Signed)
Critical Care at bedside. MD stated to leave Levophed at 74mcg/min.

## 2014-12-17 NOTE — Consult Note (Signed)
Renal Service Consult Note [facility]  [name] 12/17/2014 Sol Blazing Requesting Physician:  Dr Nelda Marseille  Reason for Consult:  eSRD pt with hypotension HPI: The patient is a 59 y.o. year-old with hx of MR, tuberous sclerosis, ESRD on HD, MRSA, and chronic hypotension. Pt sent from [facility] to ED for CP and "feeling bad".  She had red area on R thigh below AVG noted at HD yest and rec'd IV vanc/ fortaz.  She had been feeling bad for "about a week".  Admitted to ICU. Tmax was 101.4. This am is lethargic but arouses easily. Denies any AVG drainage. Blood cx's pending. On vanc/ zosyn, IV steroids and midodrine.   On admission here Jan '2016 had chronic hypotension and was felt to be ok is MS intact and SBP over 70.   Denies any CP, sob, n/v/d, abd pain right now.  No confusion or HA. No chills or fever.   Past Medical History  Past Medical History  Diagnosis Date  . Renal insufficiency   . Mental retardation   . Sleep apnea   . End-stage renal disease on hemodialysis   . Tuberous sclerosis   . Obesity   . GERD (gastroesophageal reflux disease)   . Depression   . Mental retardation   . Sleep apnea     on c-pap  . Hx MRSA infection   . Obesity   . Complication of anesthesia     "sometimes I don't wake up on time; depends on how much med"  . Asthma   . 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 Osteoarthritis   . Angina   . CHF (congestive heart failure)   . Shortness of breath     "all the time"  . Pneumonia ? 2006  . Renal failure   . ESRD (end stage renal disease) on dialysis 05/25/11    Tu, Th, Sa  . Blood transfusion   . Anemia   . Stomach ulcer   . Constipated   . Seizures     last seizure 05/21/11  . Pacemaker     AV paced   Past Surgical History  Past Surgical History  Procedure Laterality Date  . Uterine tumor  05/25/11    pt denies this history  . Nephrectomy  01/08/1994; 01/15/2006    left; right  . Shunt in arm for dialysis      right   . Thrombectomy   05/2010    shunt in my right arm  . Cholecystectomy  07/02/1990  . Tonsillectomy  03/03/1978  . Avgg removal  06-26-11    Right arm AVG removed.  . Pacemaker placement    . Vas ins tunn cv cath 5 or older ([facility] hx)      both legs  . Av fistula placement      L  . Arteriovenous graft placement      R arm  . Peripheral vascular catheterization Left 11/19/2014    Procedure: Thrombectomy;  Surgeon: Katha Cabal, MD;  Location: [place] [facility];  Service: Cardiovascular;  Laterality: Left;  . Peripheral vascular catheterization Right 11/19/2014    Procedure: A/V Shuntogram/Fistulagram;  Surgeon: Katha Cabal, MD;  Location: [place] [facility];  Service: Cardiovascular;  Laterality: Right;  . Peripheral vascular catheterization Left 11/17/2014    Procedure: Dialysis/Perma Catheter Insertion;  Surgeon: Katha Cabal, MD;  Location: [place] [facility];  Service: Cardiovascular;  Laterality: Left;   Family History  Family History  Problem Relation Age  of Onset  . Diabetes Father   . Asthma Son    Social History  reports that she has never smoked. She has never used smokeless tobacco. She reports that she does not drink alcohol or use illicit drugs. Allergies  Allergies  Allergen Reactions  . Iodinated Diagnostic Agents Hives  . Iodine     Other reaction(s): Other (See Comments) Other Reaction: contrast-itching and flushing  . Methylprednisolone Hives  . Other Itching    Other reaction(s): Other (See Comments) Uncoded Allergy. Allergen: iv contrast dye, Other Reaction: flushing Arthritis medications Other reaction(s): Other (See Comments) Uncoded Allergy. Allergen: iv contrast dye, Other Reaction: flushing Arthritis medications  . Pollen Extract Hives  . Trimethadione     Other reaction(s): Other (See Comments) Other Reaction: Other reaction  . Trazodone And Nefazodone Hives and Rash   Home medications Prior to Admission medications   Medication Sig Start Date  End Date Taking? Authorizing Provider  acetaminophen (TYLENOL) 325 MG tablet Take 650 mg by mouth every 6 (six) hours as needed for mild pain.   Yes Historical Provider, MD  ALPRAZolam Duanne Moron) 0.5 MG tablet Take 0.5 mg by mouth 2 (two) times daily as needed for anxiety.  10/14/14  Yes Historical Provider, MD  antiseptic oral rinse (BIOTENE) LIQD 10 mLs by Mouth Rinse route 3 (three) times daily as needed for dry mouth.    Yes Historical Provider, MD  bisacodyl (DULCOLAX) 5 MG EC tablet Take 2 tablets (10 mg total) by mouth daily as needed for moderate constipation. 09/14/14  Yes Allie Bossier, MD  calcitRIOL (ROCALTROL) 0.25 MCG capsule Take 1 capsule (0.25 mcg total) by mouth Every Tuesday,Thursday,and Saturday with dialysis. 09/14/14  Yes Allie Bossier, MD  calcium acetate (PHOSLO) 667 MG capsule Take 2,001 mg by mouth 3 (three) times daily.    Yes Historical Provider, MD  camphor-menthol Timoteo Ace) lotion Apply 1 application topically 3 (three) times daily as needed for itching.   Yes Historical Provider, MD  Darbepoetin Alfa (ARANESP) 200 MCG/0.4ML SOSY injection Inject 0.4 mLs (200 mcg total) into the vein every Tuesday with hemodialysis. 09/14/14  Yes Allie Bossier, MD  ethyl chloride spray Apply 1 application topically as needed (hemodialysis).   Yes Historical Provider, MD  ferric gluconate 125 mg in sodium chloride 0.9 % 100 mL Inject 125 mg into the vein every Tuesday with hemodialysis. 09/14/14  Yes Allie Bossier, MD  fexofenadine (ALLEGRA) 180 MG tablet Take 180 mg by mouth daily. For allergies   Yes Historical Provider, MD  gabapentin (NEURONTIN) 100 MG capsule Take 100 mg by mouth 2 (two) times daily.   Yes Historical Provider, MD  guaiFENesin (MUCINEX) 600 MG 12 hr tablet Take 600 mg by mouth 2 (two) times daily.   Yes Historical Provider, MD  hydrocortisone (CORTEF) 10 MG tablet Take 10 mg by mouth 2 (two) times daily.  09/17/14  Yes Historical Provider, MD  hydrocortisone (CORTEF) 5 MG tablet  Take 5 mg by mouth daily.  10/22/14  Yes Historical Provider, MD  lamoTRIgine (LAMICTAL) 25 MG tablet Take 50 mg by mouth at bedtime.  05/07/14  Yes Historical Provider, MD  levETIRAcetam (KEPPRA) 1000 MG tablet Sun/Mon/Wen/Fri Patient taking differently: Take 1,000 mg by mouth See admin instructions. 1 tablet daily on Sun/Mon/Wen/Fri 09/14/14  Yes Allie Bossier, MD  levETIRAcetam (KEPPRA) 750 MG tablet Tue/Th/Sat Patient taking differently: Take 750 mg by mouth See admin instructions. Take 1 tablet daily on Tue/Th/Sat 09/14/14  Yes Geraldo Docker  Sherral Hammers, MD  levothyroxine (SYNTHROID, LEVOTHROID) 50 MCG tablet Take 1 tablet (50 mcg total) by mouth daily before breakfast. 09/14/14  Yes Allie Bossier, MD  metroNIDAZOLE (METROCREAM) 0.75 % cream Apply 1 application topically 2 (two) times daily.  11/12/14  Yes Historical Provider, MD  midodrine (PROAMATINE) 10 MG tablet Take 2 tablets (20 mg total) by mouth 3 (three) times daily. 09/14/14  Yes Allie Bossier, MD  mupirocin ointment (BACTROBAN) 2 % Apply 1 application topically 3 (three) times daily as needed (dressing change).   Yes Historical Provider, MD  pantoprazole (PROTONIX) 40 MG tablet Take 1 tablet (40 mg total) by mouth 2 (two) times daily. 09/14/14 09/14/15 Yes Allie Bossier, MD  phenylephrine-shark liver oil-mineral oil-petrolatum (PREPARATION H) 0.25-3-14-71.9 % rectal ointment Place 1 application rectally 3 (three) times daily as needed for hemorrhoids.   Yes Historical Provider, MD  polyethylene glycol (MIRALAX / GLYCOLAX) packet Take 17 g by mouth 2 (two) times daily.   Yes Historical Provider, MD  Propylene Glycol 0.6 % SOLN Apply 1 drop to eye 2 (two) times daily.   Yes Historical Provider, MD  pseudoephedrine (SUDAFED) 30 MG tablet Take 1 tablet (30 mg total) by mouth daily. 11/20/14  Yes Aldean Jewett, MD  RESTASIS 0.05 % ophthalmic emulsion Place 1 drop into both eyes 2 (two) times daily.  06/01/14  Yes Historical Provider, MD  risperiDONE  (RISPERDAL) 0.5 MG tablet Take 0.5 mg by mouth at bedtime.  06/04/14  Yes Historical Provider, MD  risperiDONE microspheres (RISPERDAL CONSTA) 37.5 MG injection Inject 37.5 mg into the muscle every 14 (fourteen) days.   Yes Historical Provider, MD  senna-docusate (SENOKOT-S) 8.6-50 MG per tablet Take 2 tablets by mouth 2 (two) times daily.    Yes Historical Provider, MD  sertraline (ZOLOFT) 100 MG tablet Take 100 mg by mouth daily. Take with 50mg  tablet to equal 150mg    Yes Historical Provider, MD  sertraline (ZOLOFT) 50 MG tablet Take 50 mg by mouth daily. Take with 100mg  tablet to equal 150mg    Yes Historical Provider, MD  sevelamer (RENAGEL) 800 MG tablet Take 1,600 mg by mouth 3 (three) times daily with meals.   Yes Historical Provider, MD  Sod Fluoride-Potassium Nitrate 1.1-5 % PSTE Place 1 application onto teeth at bedtime.   Yes Historical Provider, MD  sodium chloride (OCEAN) 0.65 % SOLN nasal spray Place 1 spray into both nostrils every 8 (eight) hours as needed for congestion.   Yes Historical Provider, MD  SYMBICORT 160-4.5 MCG/ACT inhaler Inhale 2 puffs into the lungs 2 (two) times daily.  05/25/14  Yes Historical Provider, MD  topiramate (TOPAMAX) 200 MG tablet Take 200 mg by mouth 2 (two) times daily.     Yes Historical Provider, MD  traMADol (ULTRAM) 50 MG tablet Take 50 mg by mouth every 8 (eight) hours as needed (pain).    Yes Historical Provider, MD  triamcinolone (KENALOG) 0.025 % cream Apply 1 application topically daily as needed (rosacea). Apply to affected area 09/15/14  Yes Historical Provider, MD  zolpidem (AMBIEN) 5 MG tablet Take 1 tablet (5 mg total) by mouth at bedtime as needed for sleep. 09/14/14  Yes Allie Bossier, MD  hydrocortisone (ANUSOL-HC) 25 MG suppository Place 1 suppository (25 mg total) rectally 2 (two) times daily. 09/14/14   Allie Bossier, MD   Liver Function Tests  Recent Labs Lab 12/16/14 1859 12/17/14 0230  AST 24 22  ALT 13* 13*  ALKPHOS 206* 208*  BILITOT 0.7 0.6  PROT 6.5 6.6  ALBUMIN 3.1* 3.0*    Recent Labs Lab 12/16/14 1859  LIPASE 29   CBC  Recent Labs Lab 12/16/14 1859 12/17/14 0230  WBC 10.9* 9.1  NEUTROABS 9.5*  --   HGB 10.0* 7.7*  HCT 33.0* 25.3*  MCV 107.5* 107.2*  PLT 124* 333*   Basic Metabolic Panel  Recent Labs Lab 12/16/14 1859 12/17/14 0230  NA 139 137  K 5.1 4.6  CL 97* 98*  CO2 27 28  GLUCOSE 101* 93  BUN 28* 32*  CREATININE 4.61* 4.91*  CALCIUM 8.8* 9.0  PHOS  --  4.3    Filed Vitals:   12/17/14 1000 12/17/14 1023 12/17/14 1100 12/17/14 1151  BP:    80/44  Pulse: 60  60 59  Temp:    98.9 F (37.2 C)  TempSrc:    Oral  Resp: 13  18 19   Height:      Weight:      SpO2: 99% 99% 99% 97%   Exam Alert, no distress No rash, cyanosis or gangrene Sclera anicteric, throat clear Chest clear RRR no MRG Abd obese soft ntnd no ascites R thigh avg +bruit Faint blothcy areas of erythema below the R thigh avg, nontender, no drainage or abcess Neuro is alert, ox 3  HD: [facility] 4h 92min F200  141kg  2/2 bath Hep 4000 Calcitriol 0.75 tiw, mircera 225 last 5/31 Rec'd IV vanc and fortaz yest at HD for redness around R thigh AVG   Assessment: 1. Hypotension/ fever - r/o sepsis. Red area below AVG, will ask [facility] for opinion. Blood cx's pending on empiric Abx.  2. Chronic hypotension - prob AI, on iv steroids. Takes midodrine at home and getting it here. On levo gtt. 3. ESRD on HD 4. Tuberous sclerosis 5. MR 6. Seizure d/o 7. 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 Volume is at dry wt   Plan- plan HD Saturday, UF to dry wt. Stop IVF"s.  Consult [facility]  Kelly Splinter MD (pgr) [phone]    [phone] 12/17/2014, 12:01 PM

## 2014-12-17 NOTE — Progress Notes (Signed)
CSW confirmed that patient is from [facility] in [place]- able to return at time of DC.  CSW will continue to follow.  Domenica Reamer, [place] Social Worker [phone]

## 2014-12-17 NOTE — Care Management Note (Signed)
Case Management Note  Patient Details  Name: [name] MRN: [id number] Date of Birth: Dec 06, 1955  Subjective/Objective:     Adm w hypotension              Action/Plan: from nsg facility   Expected Discharge Date:                  Expected Discharge Plan:  [place]  In-House Referral:  Clinical Social Work  Discharge planning Services     Post Acute Care Choice:    Choice offered to:     DME Arranged:    DME Agency:     HH Arranged:    [place] Agency:     Status of Service:     Medicare Important Message Given:    Date Medicare IM Given:    Medicare IM give by:    Date Additional Medicare IM Given:    Additional Medicare Important Message give by:     If discussed at [place] of Stay Meetings, dates discussed:    Additional Comments: ur review done  Lacretia Leigh, RN 12/17/2014, 8:11 AM

## 2014-12-17 NOTE — H&P (Signed)
PULMONARY / CRITICAL CARE MEDICINE   Name: [name] MRN: [id number] DOB: 1955-08-27    ADMISSION DATE:  12/16/2014 CONSULTATION DATE:  12/17/2014  REFERRING MD :  EDP  CHIEF COMPLAINT:  Hypotension  INITIAL PRESENTATION:  59 y.o. F with ESRD (T/Th/Sat  - last dialysis Thurs 6/2) brought to [facility] ED 6/2 with hypotension.  Has hx of chronically low BP.  ? Whether BP cuff accurate due to body habitus + AV grafts.  PCCM consulted.  STUDIES:  CXR 6/2 >>> cardiomegaly, ? interstitial edema. CT A/P 6/2 >>> no acute process.  SIGNIFICANT EVENTS: 6/3 - admitted with hypotension  HISTORY OF PRESENT ILLNESS:  [name] is a 59 y.o. F with multiple co-morbidities including ESRD (T/Th/Sat - last dialysis Thurs 6/2).  She was sent to [facility] ED 6/2 for hypotension and apparently when she got into the ambulance, she informed EMS that she was having chest pain that she described as "it just hurts all over". In ED, initial troponin and EKG were negative.  She was found to be hypotensive with BP of 63/30 (though EDP states BP was taken on pt's thigh and forearm due to body habitus and AV grafts).  She was mentating normally and able to answer all questions appropriately.  She received 2L IVF and BP transiently improved with SBP > 100, but shortly fell back to 70's. PCCM was called for admission.  Of note, she was hospitalized back in February 2016.  At the time, she was seen by PCCM and was noted to have chronically low BP.  She was started on stress steroids for AI as well as midodrine.  SBP > 70 with clear mental status was accepted during that admission.   PAST MEDICAL HISTORY :   has a past medical history of Renal insufficiency; Mental retardation; Sleep apnea; End-stage renal disease on hemodialysis; Tuberous sclerosis; Obesity; GERD (gastroesophageal reflux disease); Depression; Mental retardation; Sleep apnea; MRSA infection; Obesity; Complication of anesthesia; Asthma; Epilepsy (1961);  Osteoarthritis; Angina; CHF (congestive heart failure); Shortness of breath; Pneumonia (? 2006); Renal failure; ESRD (end stage renal disease) on dialysis (05/25/11); Blood transfusion; Anemia; Stomach ulcer; Constipated; Seizures; and Pacemaker.  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 Prior to Admission medications   Medication Sig Start Date End Date Taking? Authorizing Provider  acetaminophen (TYLENOL) 325 MG tablet Take 650 mg by mouth every 6 (six) hours as needed for mild pain.    Historical Provider, MD  ALPRAZolam Duanne Moron) 0.5 MG tablet  10/14/14   Historical Provider, MD  antiseptic oral rinse (BIOTENE) LIQD 10 mLs by Mouth Rinse route as needed for dry mouth.    Historical Provider, MD  bisacodyl (DULCOLAX) 5 MG EC tablet Take 2 tablets (10 mg total) by mouth daily as needed for moderate constipation. 09/14/14   Allie Bossier, MD  calcitRIOL (ROCALTROL) 0.25 MCG capsule Take 1 capsule (0.25 mcg total) by mouth Every Tuesday,Thursday,and Saturday with dialysis. 09/14/14   Allie Bossier, MD  calcium acetate (PHOSLO) 667 MG capsule Take 2,001 mg by mouth 3 (three) times daily.     Historical Provider, MD  Darbepoetin Alfa (ARANESP) 200 MCG/0.4ML SOSY injection Inject 0.4 mLs (200 mcg  total) into the vein every Tuesday with hemodialysis. 09/14/14   Allie Bossier, MD  ferric gluconate 125 mg in sodium chloride 0.9 % 100 mL Inject 125 mg into the vein every Tuesday with hemodialysis. 09/14/14   Allie Bossier, MD  fexofenadine (ALLEGRA) 180 MG tablet  Take 180 mg by mouth daily. For allergies    Historical Provider, MD  gabapentin (NEURONTIN) 100 MG capsule Take 100 mg by mouth 2 (two) times daily.    Historical Provider, MD  hydrocortisone (ANUSOL-HC) 25 MG suppository Place 1 suppository (25 mg total) rectally 2 (two) times daily. 09/14/14   Allie Bossier, MD  hydrocortisone (CORTEF) 10 MG tablet  09/17/14   Historical Provider, MD  hydrocortisone (CORTEF) 5 MG tablet  10/22/14   Historical Provider, MD  lamoTRIgine (LAMICTAL) 25 MG tablet Take 50 mg by mouth at bedtime.  05/07/14   Historical Provider, MD  levETIRAcetam (KEPPRA) 1000 MG tablet Sun/Mon/Wen/Fri 09/14/14   Allie Bossier, MD  levETIRAcetam (KEPPRA) 250 MG tablet  08/27/14   Historical Provider, MD  levETIRAcetam (KEPPRA) 750 MG tablet Tue/Th/Sat 09/14/14   Allie Bossier, MD  levothyroxine (SYNTHROID, LEVOTHROID) 50 MCG tablet Take 1 tablet (50 mcg total) by mouth daily before breakfast. 09/14/14   Allie Bossier, MD  metroNIDAZOLE (METROCREAM) 0.75 % cream Apply vaginally if pt is still symptomatic 11/12/14   Historical Provider, MD  midodrine (PROAMATINE) 10 MG tablet Take 2 tablets (20 mg total) by mouth 3 (three) times daily. 09/14/14   Allie Bossier, MD  pantoprazole (PROTONIX) 40 MG tablet Take 1 tablet (40 mg total) by mouth 2 (two) times daily. 09/14/14 09/14/15  Allie Bossier, MD  polyethylene glycol [facility] / Floria Raveling) packet Take 17 g by mouth 2 (two) times daily.    Historical Provider, MD  Propylene Glycol 0.6 % SOLN Apply 1 drop to eye 2 (two) times daily.    Historical Provider, MD  pseudoephedrine (SUDAFED) 30 MG tablet Take 1 tablet (30 mg total) by mouth daily. 11/20/14   Aldean Jewett, MD  RENVELA 800 MG tablet  08/27/14   Historical Provider, MD  RESTASIS 0.05 % ophthalmic emulsion Place 1 drop into both eyes 2 (two) times daily.  06/01/14   Historical Provider, MD  risperiDONE (RISPERDAL) 0.5 MG tablet Take 0.5 mg by mouth at bedtime.  06/04/14   Historical Provider, MD   risperiDONE microspheres (RISPERDAL CONSTA) 37.5 MG injection Inject 37.5 mg into the muscle every 14 (fourteen) days.    Historical Provider, MD  senna-docusate (SENOKOT-S) 8.6-50 MG per tablet Take 2 tablets by mouth 2 (two) times daily.     Historical Provider, MD  sertraline (ZOLOFT) 100 MG tablet Take 100 mg by mouth daily. Take with 70m tablet to equal 1540m   Historical Provider, MD  sertraline (ZOLOFT) 50 MG tablet Take 50 mg by mouth daily. Take with 10038mablet to equal 150m88m Historical Provider, MD  sevelamer (RENAGEL) 800 MG tablet Take 1,600 mg by mouth 3 (three) times daily with meals.     Historical Provider, MD  sevelamer (RENAGEL) 800 MG tablet Take 1 tablet by mouth daily.    Historical Provider, MD  sodium chloride (OCEAN) 0.65 % SOLN nasal spray Place 1 spray into both nostrils every 8 (eight) hours as needed for congestion.    Historical Provider, MD  SYMBICORT 160-4.5 MCG/ACT inhaler Inhale 2 puffs into the lungs 2 (two) times daily.  05/25/14   Historical  Provider, MD  topiramate (TOPAMAX) 200 MG tablet Take 200 mg by mouth 2 (two) times daily.      Historical Provider, MD  traMADol (ULTRAM) 50 MG tablet Take 50 mg by mouth every 8 (eight) hours as needed (pain).     Historical Provider, MD  triamcinolone (KENALOG) 0.025 % cream Apply to affected area 09/15/14   Historical Provider, MD  zolpidem (AMBIEN) 5 MG tablet Take 1 tablet (5 mg total) by mouth at bedtime as needed for sleep. 09/14/14   Allie Bossier, MD   Allergies  Allergen Reactions  . Iodinated Diagnostic Agents Hives  . Iodine     Other reaction(s): Other (See Comments) Other Reaction: contrast-itching and flushing  . Methylprednisolone Hives  . Other Itching    Other reaction(s): Other (See Comments) Uncoded Allergy. Allergen: iv contrast dye, Other Reaction: flushing Arthritis medications Other reaction(s): Other (See Comments) Uncoded Allergy. Allergen: iv contrast dye, Other Reaction:  flushing Arthritis medications  . Pollen Extract Hives  . Trimethadione     Other reaction(s): Other (See Comments) Other Reaction: Other reaction  . Trazodone And Nefazodone Hives and Rash    FAMILY HISTORY:  Family History  Problem Relation Age of Onset  . Diabetes Father   . Asthma Son     SOCIAL HISTORY:  reports that she has never smoked. She has never used smokeless tobacco. She reports that she does not drink alcohol or use illicit drugs.  REVIEW OF SYSTEMS:  All negative; except for those that are bolded, which indicate positives.  Constitutional: weight loss, weight gain, night sweats, fevers, chills, fatigue, weakness, sleepy. HEENT: headaches, sore throat, sneezing, nasal congestion, post nasal drip, difficulty swallowing, tooth/dental problems, visual complaints, visual changes, ear aches. Neuro: difficulty with speech, weakness, numbness, ataxia. CV:  chest pain, orthopnea, PND, swelling in lower extremities, dizziness, palpitations, syncope.  Resp: cough, hemoptysis, dyspnea, wheezing. GI  heartburn, indigestion, abdominal pain, nausea, vomiting, diarrhea, constipation, change in bowel habits, loss of appetite, hematemesis, melena, hematochezia.  GU: dysuria, change in color of urine, urgency or frequency, flank pain, hematuria. MSK: joint pain or swelling, decreased range of motion. Psych: change in mood or affect, depression, anxiety, suicidal ideations, homicidal ideations. Skin: rash, itching, bruising.   SUBJECTIVE: Denies chest pain, SOB.  VITAL SIGNS: Temp:  [99.6 F (37.6 C)-101.4 F (38.6 C)] 101.4 F (38.6 C) (06/02 2121) Pulse Rate:  [58-76] 58 (06/03 0010) Resp:  [12-28] 23 (06/03 0010) BP: (54-139)/(20-121) 78/28 mmHg (06/03 0010) SpO2:  [87 %-100 %] 98 % (06/03 0000) Weight:  [147.419 kg (325 lb)] 147.419 kg (325 lb) (06/02 1850) HEMODYNAMICS:   VENTILATOR SETTINGS:   INTAKE / OUTPUT: Intake/Output    None     PHYSICAL  EXAMINATION: General: Morbidly obese female, in NAD. Neuro: Awake, A&O x 3, non-focal.  HEENT: [place]/AT. PERRL, sclerae anicteric. Cardiovascular: RRR, SEM. Lungs: Respirations even and unlabored.  CTA bilaterally, No W/R/R.  Abdomen: Obese, prior midline scar noted.  BS x 4, soft, NT/ND.  Musculoskeletal: No gross deformities, no edema.  Skin: Intact, warm, no rashes.  LABS:  CBC  Recent Labs Lab 12/16/14 1859  WBC 10.9*  HGB 10.0*  HCT 33.0*  PLT 124*   Coag's No results for input(s): APTT, INR in the last 168 hours. BMET  Recent Labs Lab 12/16/14 1859  NA 139  K 5.1  CL 97*  CO2 27  BUN 28*  CREATININE 4.61*  GLUCOSE 101*   Electrolytes  Recent Labs Lab 12/16/14 1859  CALCIUM 8.8*   Sepsis Markers  Recent Labs Lab 12/16/14 1852 12/16/14 2158  LATICACIDVEN 3.31* 1.20   ABG  Recent Labs Lab 12/16/14 1952  PHART 7.471*  PCO2ART 38.8  PO2ART 86.0   Liver Enzymes  Recent Labs Lab 12/16/14 1859  AST 24  ALT 13*  ALKPHOS 206*  BILITOT 0.7  ALBUMIN 3.1*   Cardiac Enzymes No results for input(s): TROPONINI, PROBNP in the last 168 hours. Glucose No results for input(s): GLUCAP in the last 168 hours.  Imaging Ct Abdomen Pelvis Wo Contrast  12/16/2014   CLINICAL DATA:  Abdominal pain.  Chest pain.  Sepsis.  EXAM: CT ABDOMEN AND PELVIS WITHOUT CONTRAST  TECHNIQUE: Multidetector CT imaging of the abdomen and pelvis was performed following the standard protocol without IV contrast.  COMPARISON:  CT 10/21/2014  FINDINGS: Unchanged 7 mm subpleural nodule in the right lower lobe. Additional tiny nodules are either unchanged or obscured by breathing motion. Minimal linear atelectasis in the left lower lobe. Cardiac pacemaker with battery pack in the lower abdominal wall. The heart remains enlarged, there is stable widening of the esophageal diaphragmatic hiatus with herniation of intra-abdominal fat.  Clips in the gallbladder fossa from cholecystectomy. No  biliary dilatation. Hypo attenuating lesion in the subcapsular right hepatic lobe and left lobe are unchanged from prior. No new hepatic lesions.  Spleen appears prominent measuring 14.6 cm in craniocaudal dimension, unchanged from prior. No peripancreatic inflammatory change or ductal dilatation. Adrenal glands are unchanged.  Normal kidneys are not identified. Unchanged 1.7 cm calcification in the right renal bed. Surgical clips post left nephrectomy, left nephrectomy bed unchanged in appearance with fat containing minimal striations.  Limited bowel assessment given lack of contrast. The stomach is decompressed. There are no dilated or thickened small bowel loops. There is herniation of colon and stomach within an upper abdominal ventral hernia. No signs of incarceration. Moderate stool throughout the colon without colonic wall thickening. There is gaseous distension of tortuous sigmoid colon, no obstruction. The appendix is not definitively identified.  Within the pelvis the bladder is completely decompressed. Uterus is atrophic, normal for age. No adnexal mass. No pelvic adenopathy.  Unchanged soft tissue density measuring 9 mm to the left of the aorta, possible retroperitoneal node. Abdominal aorta is normal in caliber.  Multiple fat containing ventral abdominal wall hernias containing only fat, in addition to the upper abdominal hernia containing transverse colon and stomach.  Unchanged appearance of the osseous structures with diffusely increased density consistent with renal osteodystrophy. No acute osseous abnormalities seen.  IMPRESSION: 1. No acute abnormality in the abdomen/pelvis. There is no significant change from prior exam. 2. Multiple chronic findings as described above, stable from prior.   Electronically Signed   By: Jeb Levering M.D.   On: 12/16/2014 23:25   Dg Chest Port 1 View  12/16/2014   CLINICAL DATA:  Shortness of breath.  Chest pain.  EXAM: PORTABLE CHEST - 1 VIEW  COMPARISON:   10/20/2014  FINDINGS: Mild enlargement of the cardiopericardial silhouette with pulmonary venous hypertension and indistinct pulmonary vasculature. Mild airway thickening centrally. No airspace opacity identified. Faint peripheral interstitial edema is suggested.  IMPRESSION: 1. Cardiomegaly with indistinct pulmonary vasculature and faint Kerley B-lines suggesting interstitial edema.   Electronically Signed   By: Van Clines M.D.   On: 12/16/2014 19:58      ASSESSMENT / PLAN:  CARDIOVASCULAR A line 6/3 >>> A:  Chest pain - initial troponin and EKG negative Hypotension - ? Chronic  due to ESRD.  Last admission in February noted to be hypotensive and started on stress steroids for AI as well as midodrine. Must consider infxn although no clear source identified CHF Hx PPM P:  Trend troponins. EKG reassuring Continue stress steroids, midodrine. Goal SBP > 70 if mental status remains clear. Wean norepi to off for SBP > 70  RENAL A:   ESRD - T/Thu/Sat Pseudohypocalcemia - corrects to 9.52 P:   Day team to please notify nephrology of admission. Follow ionized calcium. NS @ 75. BMP in AM.  PULMONARY A: OSA - on CPAP Hx asthma P:   Continue nocturnal CPAP. Continue symbicort. Pulmonary hygiene.  GASTROINTESTINAL A:   Obesity Elevated alk phos GERD Nutrition P:   Follow LFT Continue outpatient PPI. Renal diet.  HEMATOLOGIC A:   Anemia - chronic VTE Prophylaxis P:  Transfuse per usual ICU guidelines. SCD's / Heparin. Follow CBC  INFECTIOUS A:   Concern for sepsis on admission due to hypotension.  Lactate cleared and temp 22F with only mild leukocytosis. Pct 12.9 on 6/3 P:   BCx2 6/3 >  Vanco (empiric) 6/3 >>  Cefepime (empiric) 6/3 >>>   Will continue abx for now given elevated Pct, narrow or d/c if no source identified and cx's negative  ENDOCRINE A:   Hypothyroidism AI - on cortef as outpatient P:   Continue levothyroxine, stress  steroids.  NEUROLOGIC A:   Seizure disorder - on keppra, lamotrigine, and topiramate Hx depression, mental retardation P:   Continue outpatient keppra, lamotrigine, and topiramate, sertraline.  Family updated: None.  Interdisciplinary Family Meeting v Palliative Care Meeting:  Due by: 12/23/14.   Montey Hora, [facility] Pager: [phone]  or [phone] 12/17/2014, 12:31 AM  Attending Note:  I have examined patient, reviewed labs, studies and notes. I have discussed the case with [place], and I agree with the data and plans as amended above. Pt with chronic hypotension, ESRD presented with shock, elevated Pct and lactate concerning for septic shock. Responded temporarily to volume but then started on norepi. On my exam she has no clear focus of infxn. Her MS is at baseline despite hypotension. Reportedly she tolerates a SBP in 70's and has done so chronically. I will continue empiric abx pending weaning of norepi, cx data. Continue midodrine and hydrocort. Independent critical care time is 50 minutes.   Baltazar Apo, MD, PhD 12/17/2014, 10:00 AM [facility] [phone] or if no answer [phone]

## 2014-12-17 NOTE — Progress Notes (Signed)
Dr. Lamonte Sakai at bedside. Orders to titrate pressors to maintain SBP > 80. Will continue to monitor.

## 2014-12-18 DIAGNOSIS — L03119 Cellulitis of unspecified part of limb: Secondary | ICD-10-CM | POA: Insufficient documentation

## 2014-12-18 DIAGNOSIS — A419 Sepsis, unspecified organism: Secondary | ICD-10-CM

## 2014-12-18 DIAGNOSIS — R6521 Severe sepsis with septic shock: Secondary | ICD-10-CM

## 2014-12-18 LAB — CBC
HCT: 22.2 % — ABNORMAL LOW (ref 36.0–46.0)
HEMOGLOBIN: 7 g/dL — AB (ref 12.0–15.0)
MCH: 32.9 pg (ref 26.0–34.0)
MCHC: 31.5 g/dL (ref 30.0–36.0)
MCV: 104.2 fL — AB (ref 78.0–100.0)
Platelets: 127 10*3/uL — ABNORMAL LOW (ref 150–400)
RBC: 2.13 MIL/uL — AB (ref 3.87–5.11)
RDW: 18.3 % — ABNORMAL HIGH (ref 11.5–15.5)
WBC: 6.7 10*3/uL (ref 4.0–10.5)

## 2014-12-18 LAB — BASIC METABOLIC PANEL
Anion gap: 12 (ref 5–15)
BUN: 51 mg/dL — ABNORMAL HIGH (ref 6–20)
CHLORIDE: 97 mmol/L — AB (ref 101–111)
CO2: 26 mmol/L (ref 22–32)
CREATININE: 6.59 mg/dL — AB (ref 0.44–1.00)
Calcium: 8.8 mg/dL — ABNORMAL LOW (ref 8.9–10.3)
GFR calc Af Amer: 7 mL/min — ABNORMAL LOW (ref >60)
GFR calc non Af Amer: 6 mL/min — ABNORMAL LOW (ref >60)
Glucose, Bld: 108 mg/dL — ABNORMAL HIGH (ref 65–99)
Potassium: 5.1 mmol/L (ref 3.5–5.1)
SODIUM: 135 mmol/L (ref 135–145)

## 2014-12-18 LAB — MAGNESIUM: Magnesium: 2 mg/dL (ref 1.7–2.4)

## 2014-12-18 LAB — CALCIUM, IONIZED: CALCIUM, IONIZED, SERUM: 4.9 mg/dL (ref 4.5–5.6)

## 2014-12-18 LAB — PROCALCITONIN: Procalcitonin: 14.41 ng/mL

## 2014-12-18 LAB — PHOSPHORUS: Phosphorus: 5.3 mg/dL — ABNORMAL HIGH (ref 2.5–4.6)

## 2014-12-18 LAB — VANCOMYCIN, RANDOM: Vancomycin Rm: 12 ug/mL

## 2014-12-18 MED ORDER — PENTAFLUOROPROP-TETRAFLUOROETH EX AERO: 1.0000 "application " | INHALATION_SPRAY | CUTANEOUS | Status: DC | PRN

## 2014-12-18 MED ORDER — ALTEPLASE 2 MG IJ SOLR
2.0000 mg | Freq: Once | INTRAMUSCULAR | Status: DC | PRN
  Filled 2014-12-18: qty 2

## 2014-12-18 MED ORDER — LIDOCAINE-PRILOCAINE 2.5-2.5 % EX CREA
1.0000 "application " | TOPICAL_CREAM | CUTANEOUS | Status: DC | PRN
  Filled 2014-12-18: qty 5

## 2014-12-18 MED ORDER — NEPRO/CARBSTEADY PO LIQD
237.0000 mL | ORAL | Status: DC | PRN
  Filled 2014-12-18: qty 237

## 2014-12-18 MED ORDER — METHYLPREDNISOLONE SODIUM SUCC 125 MG IJ SOLR
40.0000 mg | Freq: Once | INTRAMUSCULAR | Status: AC
  Administered 2014-12-18: 40 mg via INTRAVENOUS
  Filled 2014-12-18: qty 2

## 2014-12-18 MED ORDER — SODIUM CHLORIDE 0.9 % IV SOLN: 100.0000 mL | INTRAVENOUS | Status: DC | PRN

## 2014-12-18 MED ORDER — PIPERACILLIN-TAZOBACTAM IN DEX 2-0.25 GM/50ML IV SOLN
2.2500 g | Freq: Three times a day (TID) | INTRAVENOUS | Status: DC
  Administered 2014-12-18 – 2014-12-21 (×9): 2.25 g via INTRAVENOUS
  Filled 2014-12-18 (×14): qty 50

## 2014-12-18 MED ORDER — HEPARIN SODIUM (PORCINE) 1000 UNIT/ML DIALYSIS: 4000.0000 [IU] | Freq: Once | INTRAMUSCULAR | Status: DC

## 2014-12-18 MED ORDER — CALCIUM ACETATE (PHOS BINDER) 667 MG PO CAPS
667.0000 mg | ORAL_CAPSULE | Freq: Three times a day (TID) | ORAL | Status: DC
  Administered 2014-12-19: 667 mg via ORAL
  Filled 2014-12-18 (×4): qty 1

## 2014-12-18 MED ORDER — DIPHENHYDRAMINE HCL 50 MG/ML IJ SOLN
50.0000 mg | Freq: Once | INTRAMUSCULAR | Status: AC
  Administered 2014-12-18: 50 mg via INTRAVENOUS
  Filled 2014-12-18: qty 1

## 2014-12-18 MED ORDER — PIPERACILLIN-TAZOBACTAM 3.375 G IVPB 30 MIN
3.3750 g | Freq: Once | INTRAVENOUS | Status: DC
  Filled 2014-12-18: qty 50

## 2014-12-18 MED ORDER — SEVELAMER CARBONATE 800 MG PO TABS
1600.0000 mg | ORAL_TABLET | Freq: Three times a day (TID) | ORAL | Status: DC
  Administered 2014-12-19 – 2014-12-23 (×13): 1600 mg via ORAL
  Filled 2014-12-18 (×16): qty 2

## 2014-12-18 MED ORDER — VANCOMYCIN HCL IN DEXTROSE 1-5 GM/200ML-% IV SOLN
1000.0000 mg | INTRAVENOUS | Status: DC
  Administered 2014-12-18 – 2014-12-21 (×2): 1000 mg via INTRAVENOUS
  Filled 2014-12-18 (×4): qty 200

## 2014-12-18 MED ORDER — HEPARIN SODIUM (PORCINE) 1000 UNIT/ML DIALYSIS: 1000.0000 [IU] | INTRAMUSCULAR | Status: DC | PRN

## 2014-12-18 MED ORDER — VANCOMYCIN HCL IN DEXTROSE 1-5 GM/200ML-% IV SOLN
1000.0000 mg | Freq: Once | INTRAVENOUS | Status: DC
  Filled 2014-12-18: qty 200

## 2014-12-18 MED ORDER — LIDOCAINE HCL (PF) 1 % IJ SOLN: 5.0000 mL | INTRAMUSCULAR | Status: DC | PRN

## 2014-12-18 NOTE — H&P (Addendum)
PULMONARY / CRITICAL CARE MEDICINE   Name: [name] MRN: [id number] DOB: 06/25/1956    ADMISSION DATE:  12/16/2014 CONSULTATION DATE:  12/18/2014  REFERRING MD :  EDP  CHIEF COMPLAINT:  Hypotension  INITIAL PRESENTATION:   59 y.o. F with multiple co-morbidities including ESRD (T/Th/Sat - last dialysis Thurs 6/2).  She was sent to [facility] ED 6/2 for hypotension and apparently when she got into the ambulance, she informed EMS that she was having chest pain that she described as "it just hurts all over". In ED, initial troponin and EKG were negative.  She was found to be hypotensive with BP of 63/30 (though EDP states BP was taken on pt's thigh and forearm due to body habitus and AV grafts).  She was mentating normally and able to answer all questions appropriately.  She received 2L IVF and BP transiently improved with SBP > 100, but shortly fell back to 70's. PCCM was called for admission.  Of note, she was hospitalized back in February 2016.  At the time, she was seen by PCCM and was noted to have chronically low BP.  She was started on stress steroids for AI as well as midodrine.  SBP > 70 with clear mental status was accepted during that admission.     SIGNIFICANT EVENTS: 12/17/14: PCCM MD summar: Pt with chronic hypotension, ESRD presented with shock, elevated Pct and lactate concerning for septic shock. Responded temporarily to volume but then started on norepi. On my exam she has no clear focus of infxn. Her MS is at baseline despite hypotension. Reportedly she tolerates a SBP in 70's and has done so chronically. I will continue empiric abx pending weaning of norepi, cx data. Continue midodrine and hydrocort   SUBJECTIVE/OVERNIGHT/INTERVAL HX 12/18/14: Hx from RN: came off pressors. SBP 87 in cuff with 95 in aline (baseline is 70s per RN). S/p hd today. Doing well. RN th inks patient can be transferred out. Patient not on vent or pressors. Denies complaints, remains on broad abx  VVS  notes indicatd 48h of Rt thigh graft redness   VITAL SIGNS: Temp:  [97.3 F (36.3 C)-99 F (37.2 C)] 98.7 F (37.1 C) (06/04 1600) Pulse Rate:  [36-72] 65 (06/04 1359) Resp:  [8-23] 15 (06/04 1359) BP: (70-97)/(26-68) 87/48 mmHg (06/04 1359) SpO2:  [89 %-100 %] 97 % (06/04 1359) Arterial Line BP: (66-96)/(40-54) 89/50 mmHg (06/04 0900) Weight:  [147.3 kg (324 lb 11.8 oz)-149 kg (328 lb 7.8 oz)] 149 kg (328 lb 7.8 oz) (06/04 1359) HEMODYNAMICS:   VENTILATOR SETTINGS:   INTAKE / OUTPUT: Intake/Output      06/03 0701 - 06/04 0700 06/04 0701 - 06/05 0700   P.O. 480 120   I.V. (mL/kg) 1255.3 (8.5)    Total Intake(mL/kg) 1735.3 (11.8) 120 (0.8)   Other  0   Total Output   0   Net +1735.3 +120          PHYSICAL EXAMINATION: General: Morbidly obese female, in NAD. Looks very deconditioned Neuro: Awake, A&O x 2, non-focal.  HEENT: [place]/AT. PERRL, sclerae anicteric. Cardiovascular: RRR, SEM. Lungs: Respirations even and unlabored.  CTA bilaterally, No W/R/R.  Abdomen: Obese, prior midline scar noted.  BS x 4, soft, NT/ND. Large ventral  Hernia + , reducibe and no tenderness. ABd is soft  Musculoskeletal: No gross deformities, no edema.  Skin: Intact, warm, no rashes.  LABS:  PULMONARY  Recent Labs Lab 12/16/14 1952  PHART 7.471*  PCO2ART 38.8  PO2ART 86.0  HCO3 28.3*  TCO2 29  O2SAT 97.0    CBC  Recent Labs Lab 12/16/14 1859 12/17/14 0230 12/18/14 0505  HGB 10.0* 7.7* 7.0*  HCT 33.0* 25.3* 22.2*  WBC 10.9* 9.1 6.7  PLT 124* 139* 127*    COAGULATION No results for input(s): INR in the last 168 hours.  CARDIAC   Recent Labs Lab 12/17/14 0230 12/17/14 0641  TROPONINI 0.10* 0.09*   No results for input(s): PROBNP in the last 168 hours.   CHEMISTRY  Recent Labs Lab 12/16/14 1859 12/17/14 0230 12/18/14 0505  NA 139 137 135  K 5.1 4.6 5.1  CL 97* 98* 97*  CO2 27 28 26   GLUCOSE 101* 93 108*  BUN 28* 32* 51*  CREATININE 4.61* 4.91* 6.59*   CALCIUM 8.8* 9.0 8.8*  MG  --  2.1 2.0  PHOS  --  4.3 5.3*   Estimated Creatinine Clearance: 14.5 mL/min (by C-G formula based on Cr of 6.59).   LIVER  Recent Labs Lab 12/16/14 1859 12/17/14 0230  AST 24 22  ALT 13* 13*  ALKPHOS 206* 208*  BILITOT 0.7 0.6  PROT 6.5 6.6  ALBUMIN 3.1* 3.0*     INFECTIOUS  Recent Labs Lab 12/16/14 1852 12/16/14 2158 12/17/14 0230 12/18/14 0505  LATICACIDVEN 3.31* 1.20  --   --   PROCALCITON  --   --  12.89 14.41     ENDOCRINE CBG (last 3)  No results for input(s): GLUCAP in the last 72 hours.       IMAGING x48h  - image(s) personally visualized  -   highlighted in bold Ct Abdomen Pelvis Wo Contrast  12/16/2014   CLINICAL DATA:  Abdominal pain.  Chest pain.  Sepsis.  EXAM: CT ABDOMEN AND PELVIS WITHOUT CONTRAST  TECHNIQUE: Multidetector CT imaging of the abdomen and pelvis was performed following the standard protocol without IV contrast.  COMPARISON:  CT 10/21/2014  FINDINGS: Unchanged 7 mm subpleural nodule in the right lower lobe. Additional tiny nodules are either unchanged or obscured by breathing motion. Minimal linear atelectasis in the left lower lobe. Cardiac pacemaker with battery pack in the lower abdominal wall. The heart remains enlarged, there is stable widening of the esophageal diaphragmatic hiatus with herniation of intra-abdominal fat.  Clips in the gallbladder fossa from cholecystectomy. No biliary dilatation. Hypo attenuating lesion in the subcapsular right hepatic lobe and left lobe are unchanged from prior. No new hepatic lesions.  Spleen appears prominent measuring 14.6 cm in craniocaudal dimension, unchanged from prior. No peripancreatic inflammatory change or ductal dilatation. Adrenal glands are unchanged.  Normal kidneys are not identified. Unchanged 1.7 cm calcification in the right renal bed. Surgical clips post left nephrectomy, left nephrectomy bed unchanged in appearance with fat containing minimal  striations.  Limited bowel assessment given lack of contrast. The stomach is decompressed. There are no dilated or thickened small bowel loops. There is herniation of colon and stomach within an upper abdominal ventral hernia. No signs of incarceration. Moderate stool throughout the colon without colonic wall thickening. There is gaseous distension of tortuous sigmoid colon, no obstruction. The appendix is not definitively identified.  Within the pelvis the bladder is completely decompressed. Uterus is atrophic, normal for age. No adnexal mass. No pelvic adenopathy.  Unchanged soft tissue density measuring 9 mm to the left of the aorta, possible retroperitoneal node. Abdominal aorta is normal in caliber.  Multiple fat containing ventral abdominal wall hernias containing only fat, in addition to the upper abdominal hernia containing  transverse colon and stomach.  Unchanged appearance of the osseous structures with diffusely increased density consistent with renal osteodystrophy. No acute osseous abnormalities seen.  IMPRESSION: 1. No acute abnormality in the abdomen/pelvis. There is no significant change from prior exam. 2. Multiple chronic findings as described above, stable from prior.   Electronically Signed   By: Jeb Levering M.D.   On: 12/16/2014 23:25   Dg Chest Port 1 View  12/16/2014   CLINICAL DATA:  Shortness of breath.  Chest pain.  EXAM: PORTABLE CHEST - 1 VIEW  COMPARISON:  10/20/2014  FINDINGS: Mild enlargement of the cardiopericardial silhouette with pulmonary venous hypertension and indistinct pulmonary vasculature. Mild airway thickening centrally. No airspace opacity identified. Faint peripheral interstitial edema is suggested.  IMPRESSION: 1. Cardiomegaly with indistinct pulmonary vasculature and faint Kerley B-lines suggesting interstitial edema.   Electronically Signed   By: Van Clines M.D.   On: 12/16/2014 19:58        ASSESSMENT / PLAN:  CARDIAC A line 6/3 >>>64/16 A:   #BAseine   - chronic diast chf - chronic hypotension -  baseline sbp high 70s -d ue to ESRD and rai -on midodrine and steroids at home  #Admit  #Chest pain - EKG negative. Troo mild bump and flat - likely renal related   # Hypotension - PEr RN   - admitted to ICU with sbp 60s.  - possible sepsis overlay (PCT high but can be in accurate in esrd). Now off levophed   P:  Get echo Continue stress steroids, midodrine. Goal BP : SBP > 75 +  mental status remains clear.   VASCULAR A  #BAseline  = Hx of multiple prior access procedures and graft infections. Right thigh graft bovine carotid placed at [facility] about 18 mo ago. Since that time multiple thrombectomies percutaneously, multiple viabahn stents placed. Most recent thrombectomy 6 weeks ago  #Current 48h of redness of right thigh graft prior to admit  P  - CT angio LE per VVS   RENAL A:   ESRD - T/Thu/Sat Pseudohypocalcemia - corrects to 9.52 P:   Per renal   PULMONARY A: OSA - on CPAP Hx asthma  - nil acute P:   Continue nocturnal CPAP. Continue symbicort. Pulmonary hygiene.  GASTROINTESTINAL A:   #Baseline  - Morbid Obesity - GERD  - LArge reducible ventral abdominal hernia ? Incisional - chronic elevation alk phos - baseline 200-300  #current Elevated alk phos @ baseline high No issues with hernia  P:   Continue outpatient PPI. Renal diet.  HEMATOLOGIC A:   Anemia - chronic VTE Prophylaxis   hgb 7gm% 12/18/2014   P:  Transfuse per usual ICU guidelines. - if Hgb is </= 6.9gm% - 1 unit only at a time.  No PRBC 12/18/2014 SCD's / Heparin. Follow CBC  INFECTIOUS A:   #Hx of several prior access procedures and graft infections  #Current: Concern for severe sepsis on admission due to right thigh graft infection   -  off pressors. Unclear if PCT elevation is due to renal failure v sepsis. 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  NEUROLOGIC A:   Seizure disorder - on keppra, lamotrigine, and topiramate Hx depression, mental retardation   - appears deconditioned and oriented x 2. ? baseline  P:   Continue outpatient keppra, lamotrigine, and topiramate, sertraline.  Family updated: None at bedside  Interdisciplinary Family Meeting v Palliative Care Meeting:  Due by: 12/23/14.   GLOBAL 12/18/2014: move to renal floor and PCCM off from 12/19/14 with [facility] primary - d/w Dr Sherral Hammers   Dr. Brand Males, M.D., [employer].C.P Pulmonary and Critical Care Medicine Staff Physician [facility] Pager: [phone], If no answer or between  15:00h - 7:00h: call [phone]  12/18/2014 5:08 PM

## 2014-12-18 NOTE — Progress Notes (Signed)
Patient going to C/T, will placed patient on CPAP when she returns

## 2014-12-18 NOTE — Consult Note (Signed)
[facility] [place] Consult Reason for consult: infected right thigh graft Requesting: Dr Jonnie Finner History of Present Illness:  Patient is a 59 y.o. female who presents for evaluation of right thigh graft.  Pt was admitted with hypotension and septic picture a few days ago.  Over last 48 hr has developed progressive erythema over inner aspect of right thigh graft.  Pt with multiple prior access procedures and graft infections.  Right thigh graft bovine carotid placed at [facility] about 18 mo ago.  Since that time multiple thrombectomies percutaneously, multiple viabahn stents placed.  Most recent thrombectomy 6 weeks ago.  Pt states she feels tired about the same as when she came into hospital. Currently off pressors.  She is not currently not on antibiotics.   Other medical problems include sleep apnea, obesity, MRSA history, seizures, AV pacer, CHF all currently stable.  Past Medical History  Diagnosis Date  . Renal insufficiency   . Mental retardation   . Sleep apnea   . End-stage renal disease on hemodialysis   . Tuberous sclerosis   . Obesity   . GERD (gastroesophageal reflux disease)   . Depression   . Mental retardation   . Sleep apnea     on c-pap  . Hx MRSA infection   . Obesity   . Complication of anesthesia     "sometimes I don't wake up on time; depends on how much med"  . Asthma   . 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 Osteoarthritis   . Angina   . CHF (congestive heart failure)   . Shortness of breath     "all the time"  . Pneumonia ? 2006  . Renal failure   . ESRD (end stage renal disease) on dialysis 05/25/11    Tu, Th, Sa  . Blood transfusion   . Anemia   . Stomach ulcer   . Constipated   . Seizures     last seizure 05/21/11  . Pacemaker     AV paced    Past Surgical History  Procedure Laterality Date  . Uterine tumor  05/25/11    pt denies this history  . Nephrectomy  01/08/1994; 01/15/2006    left; right  . Shunt in arm for dialysis      right   .  Thrombectomy  05/2010    shunt in my right arm  . Cholecystectomy  07/02/1990  . Tonsillectomy  03/03/1978  . Avgg removal  06-26-11    Right arm AVG removed.  . Pacemaker placement    . Vas ins tunn cv cath 5 or older ([facility] hx)      both legs  . Av fistula placement      L  . Arteriovenous graft placement      R arm  . Peripheral vascular catheterization Left 11/19/2014    Procedure: Thrombectomy;  Surgeon: Katha Cabal, MD;  Location: [place] [facility];  Service: Cardiovascular;  Laterality: Left;  . Peripheral vascular catheterization Right 11/19/2014    Procedure: A/V Shuntogram/Fistulagram;  Surgeon: Katha Cabal, MD;  Location: [place] [facility];  Service: Cardiovascular;  Laterality: Right;  . Peripheral vascular catheterization Left 11/17/2014    Procedure: Dialysis/Perma Catheter Insertion;  Surgeon: Katha Cabal, MD;  Location: [place] [facility];  Service: Cardiovascular;  Laterality: Left;    Social History History  Substance Use Topics  . Smoking status: Never Smoker   . Smokeless tobacco: Never Used  . Alcohol Use: No  Family History Family History  Problem Relation Age of Onset  . Diabetes Father   . Asthma Son     Allergies  Allergies  Allergen Reactions  . Iodinated Diagnostic Agents Hives  . Iodine     Other reaction(s): Other (See Comments) Other Reaction: contrast-itching and flushing  . Methylprednisolone Hives  . Other Itching    Other reaction(s): Other (See Comments) Uncoded Allergy. Allergen: iv contrast dye, Other Reaction: flushing Arthritis medications Other reaction(s): Other (See Comments) Uncoded Allergy. Allergen: iv contrast dye, Other Reaction: flushing Arthritis medications  . Pollen Extract Hives  . Trimethadione     Other reaction(s): Other (See Comments) Other Reaction: Other reaction  . Trazodone And Nefazodone Hives and Rash     Current Facility-Administered Medications  Medication Dose Route  Frequency Provider Last Rate Last Dose  . 0.9 %  sodium chloride infusion  250 mL Intravenous PRN Rahul P Desai, PA-C      . 0.9 %  sodium chloride infusion  100 mL Intravenous PRN Roney Jaffe, MD      . 0.9 %  sodium chloride infusion  100 mL Intravenous PRN Roney Jaffe, MD      . alteplase (CATHFLO ACTIVASE) injection 2 mg  2 mg Intracatheter Once PRN Roney Jaffe, MD      . antiseptic oral rinse (Wilberforce / CETYLPYRIDINIUM CHLORIDE 0.05%) solution 7 mL  7 mL Mouth Rinse BID Rush Farmer, MD   7 mL at 12/18/14 0913  . budesonide-formoterol (SYMBICORT) 160-4.5 MCG/ACT inhaler 2 puff  2 puff Inhalation BID Rush Farmer, MD   2 puff at 12/18/14 0904  . Chlorhexidine Gluconate Cloth 2 % PADS 6 each  6 each Topical Q0600 Rush Farmer, MD   6 each at 12/18/14 0516  . feeding supplement (NEPRO CARB STEADY) liquid 237 mL  237 mL Oral PRN Roney Jaffe, MD      . heparin injection 1,000 Units  1,000 Units Dialysis PRN Roney Jaffe, MD      . Derrill Memo ON 12/19/2014] heparin injection 4,000 Units  4,000 Units Dialysis Once in dialysis Roney Jaffe, MD      . heparin injection 5,000 Units  5,000 Units Subcutaneous 3 times per day Rahul Dianna Rossetti, PA-C   5,000 Units at 12/18/14 0516  . hydrocortisone sodium succinate (SOLU-CORTEF) 100 MG injection 100 mg  100 mg Intravenous Q8H Rahul P Desai, PA-C   100 mg at 12/18/14 0747  . lamoTRIgine (LAMICTAL) tablet 50 mg  50 mg Oral QHS Rahul P Desai, PA-C   50 mg at 12/17/14 2121  . levETIRAcetam (KEPPRA) tablet 1,000 mg  1,000 mg Oral Once per day on Sun Mon Wed Fri Rahul P Desai, PA-C   1,000 mg at 12/17/14 7169  . levETIRAcetam (KEPPRA) tablet 750 mg  750 mg Oral Once per day on Tue Thu Sat Rahul P Desai, PA-C   750 mg at 12/18/14 0913  . levothyroxine (SYNTHROID, LEVOTHROID) tablet 50 mcg  50 mcg Oral QAC breakfast Rahul P Desai, PA-C   50 mcg at 12/18/14 0747  . lidocaine (PF) (XYLOCAINE) 1 % injection 5 mL  5 mL Intradermal PRN Roney Jaffe, MD      .  lidocaine-prilocaine (EMLA) cream 1 application  1 application Topical PRN Roney Jaffe, MD      . midodrine (PROAMATINE) tablet 20 mg  20 mg Oral TID WC Rahul P Desai, PA-C   20 mg at 12/18/14 0747  . mupirocin ointment (BACTROBAN) 2 %  1 application  1 application Nasal BID Rush Farmer, MD   1 application at 51/02/58 0913  . norepinephrine (LEVOPHED) 16 mg in dextrose 5 % 250 mL (0.064 mg/mL) infusion  2-50 mcg/min Intravenous Continuous Rahul P Desai, PA-C   Stopped at 12/17/14 2100  . pantoprazole (PROTONIX) EC tablet 40 mg  40 mg Oral BID Rahul P Desai, PA-C   40 mg at 12/18/14 0914  . pentafluoroprop-tetrafluoroeth (GEBAUERS) aerosol 1 application  1 application Topical PRN Roney Jaffe, MD      . sertraline (ZOLOFT) tablet 100 mg  100 mg Oral Daily Rahul P Desai, PA-C   100 mg at 12/18/14 0912  . sodium chloride 0.9 % injection 10-40 mL  10-40 mL Intracatheter Q12H Rush Farmer, MD   30 mL at 12/18/14 0914  . sodium chloride 0.9 % injection 10-40 mL  10-40 mL Intracatheter PRN Rush Farmer, MD      . topiramate (TOPAMAX) tablet 200 mg  200 mg Oral BID Rahul P Desai, PA-C   200 mg at 12/18/14 0912    ROS:   General:  No weight loss, Fever, chills  HEENT: No recent headaches, no nasal bleeding, no visual changes, no sore throat  Neurologic: No dizziness, blackouts, seizures. No recent symptoms of stroke or mini- stroke. No recent episodes of slurred speech, or temporary blindness.  Cardiac: No recent episodes of chest pain/pressure, no shortness of breath at rest.  + shortness of breath with exertion.  Denies history of atrial fibrillation or irregular heartbeat  Vascular: No history of rest pain in feet.  No history of claudication.  No history of non-healing ulcer, No history of DVT   Pulmonary: No home oxygen, no productive cough, no hemoptysis,  +asthma or wheezing  Musculoskeletal:  [ ]  Arthritis, [ ]  Low back pain,  [ ]  Joint pain  Hematologic:No history of  hypercoagulable state.  No history of easy bleeding.  No history of anemia  Gastrointestinal: No hematochezia or melena,  No gastroesophageal reflux, no trouble swallowing  Urinary: [ ]  chronic Kidney disease, [x ] on HD - [ ]  MWF or [ ]  TTHS, [ ]  Burning with urination, [ ]  Frequent urination, [ ]  Difficulty urinating;   Skin: No rashes  Psychological: No history of anxiety,  No history of depression   Physical Examination  Filed Vitals:   12/18/14 0900 12/18/14 0902 12/18/14 0945 12/18/14 0950  BP:  87/48  96/44  Pulse: 60 58  56  Temp:  98.3 F (36.8 C)    TempSrc:  Oral    Resp: 18 8  16   Height:      Weight:   149 kg (328 lb 7.8 oz)   SpO2: 97% 98% 97% 97%    Body mass index is 51.44 kg/(m^2).  General:  Alert and oriented, no acute distress HEENT: Normal Neck: No JVD Pulmonary: Clear to auscultation bilaterally Cardiac: Regular Rate and Rhythm  Abdomen: Soft, non-tender, non-distended, obese Skin: No rash, erythema right inner thigh area 10 x 10 cm, no mass or fluctuance, some induration Extremity:  Right thigh graft on dialysis currently flow adequate no real skin changes over graft Musculoskeletal: No deformity or edema  Neurologic: Upper and lower extremity motor 5/5 and symmetric  DATA:  CBC    Component Value Date/Time   WBC 6.7 12/18/2014 0505   WBC 3.8 10/21/2014 0942   RBC 2.13* 12/18/2014 0505   RBC 2.92* 10/21/2014 0942   RBC 2.87* 12/18/2009 2249  HGB 7.0* 12/18/2014 0505   HGB 9.5* 10/21/2014 0942   HCT 22.2* 12/18/2014 0505   HCT 30.1* 10/21/2014 0942   PLT 127* 12/18/2014 0505   PLT 107* 10/21/2014 0942   MCV 104.2* 12/18/2014 0505   MCV 103* 10/21/2014 0942   MCH 32.9 12/18/2014 0505   MCH 32.5 10/21/2014 0942   MCHC 31.5 12/18/2014 0505   MCHC 31.5* 10/21/2014 0942   RDW 18.3* 12/18/2014 0505   RDW 19.6* 10/21/2014 0942   LYMPHSABS 0.8 12/16/2014 1859   LYMPHSABS 1.2 10/21/2014 0942   MONOABS 0.5 12/16/2014 1859   MONOABS 0.2  10/21/2014 0942   EOSABS 0.0 12/16/2014 1859   EOSABS 0.1 10/21/2014 0942   BASOSABS 0.0 12/16/2014 1859   BASOSABS 0.0 10/21/2014 0942    BMET    Component Value Date/Time   NA 135 12/18/2014 0505   NA 137 10/21/2014 0941   K 5.1 12/18/2014 0505   K 4.0 10/21/2014 0941   CL 97* 12/18/2014 0505   CL 96* 10/21/2014 0941   CO2 26 12/18/2014 0505   CO2 28 10/21/2014 0941   GLUCOSE 108* 12/18/2014 0505   GLUCOSE 121* 10/21/2014 0941   BUN 51* 12/18/2014 0505   BUN 61* 10/21/2014 0941   CREATININE 6.59* 12/18/2014 0505   CREATININE 6.42* 10/21/2014 0941   CALCIUM 8.8* 12/18/2014 0505   CALCIUM 8.7* 10/21/2014 0941   CALCIUM 10.2 02/09/2010 1028   GFRNONAA 6* 12/18/2014 0505   GFRNONAA 7* 10/21/2014 0941   GFRAA 7* 12/18/2014 0505   GFRAA 8* 10/21/2014 0941    ASSESSMENT:  Possible graft infection in a patient with limited access options   PLAN:  1.  Would start Vanc with dialysis  2. CT scan right leg with contrast will need premed due to prior rash with contrast  Ruta Hinds, MD [facility]: [phone] Pager: [phone]

## 2014-12-18 NOTE — Progress Notes (Signed)
  [facility] Progress Note   Subjective: feeling better, "my BP is coming up"  Filed Vitals:   12/18/14 0800 12/18/14 0819 12/18/14 0900 12/18/14 0902  BP:      Pulse: 60 67 60   Temp:  97.6 F (36.4 C)    TempSrc:  Oral    Resp: 13 14 18    Height:      Weight:      SpO2: 99% 98% 97% 98%   Exam: Alert, no distress No jvd or nodes Chest clear RRR no MRG Abd obese soft ntnd no ascites R thigh avg +bruit Moderate erythema below the R thigh avg, looks worse today, nontender, no drainage or abcess Neuro is alert, ox 3  HD: [place] [facility] 4h 64min F200 141kg 2/2 bath Hep 4000 Calcitriol 0.75 tiw, mircera 225 last 5/31 Rec'd IV vanc and fortaz yest at HD for redness around R thigh AVG   Assessment: 1. Hypotension/ fever - r/o sepsis. Erythema R thigh below AVG. Blood cx's negative, temps improving and BP up, on empiric Abx.  2. Acute on chronic hypotension - prob AI, on iv steroids. Takes midodrine at home and here. S/p pressors 3. ESRD on HD 4. Tuberous sclerosis 5. MR 6. Seizure d/o 7. 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 Volume is up several kg due to fluid boluses   Plan - HD today, UF as Ronnie Derby MD  pager [phone]    cell [phone]  12/18/2014, 9:41 AM     Recent Labs Lab 12/16/14 1859 12/17/14 0230 12/18/14 0505  NA 139 137 135  K 5.1 4.6 5.1  CL 97* 98* 97*  CO2 27 28 26   GLUCOSE 101* 93 108*  BUN 28* 32* 51*  CREATININE 4.61* 4.91* 6.59*  CALCIUM 8.8* 9.0 8.8*  PHOS  --  4.3 5.3*    Recent Labs Lab 12/16/14 1859 12/17/14 0230  AST 24 22  ALT 13* 13*  ALKPHOS 206* 208*  BILITOT 0.7 0.6  PROT 6.5 6.6  ALBUMIN 3.1* 3.0*    Recent Labs Lab 12/16/14 1859 12/17/14 0230 12/18/14 0505  WBC 10.9* 9.1 6.7  NEUTROABS 9.5*  --   --   HGB 10.0* 7.7* 7.0*  HCT 33.0* 25.3* 22.2*  MCV 107.5* 107.2* 104.2*  PLT 124* 139* 127*   . antiseptic oral rinse  7 mL Mouth Rinse BID  . budesonide-formoterol  2  puff Inhalation BID  . Chlorhexidine Gluconate Cloth  6 each Topical Q0600  . heparin  5,000 Units Subcutaneous 3 times per day  . hydrocortisone sod succinate (SOLU-CORTEF) inj  100 mg Intravenous Q8H  . lamoTRIgine  50 mg Oral QHS  . levETIRAcetam  1,000 mg Oral Once per day on Sun Mon Wed Fri  . levETIRAcetam  750 mg Oral Once per day on Tue Thu Sat  . levothyroxine  50 mcg Oral QAC breakfast  . midodrine  20 mg Oral TID WC  . mupirocin ointment  1 application Nasal BID  . pantoprazole  40 mg Oral BID  . sertraline  100 mg Oral Daily  . sodium chloride  10-40 mL Intracatheter Q12H  . topiramate  200 mg Oral BID   . norepinephrine (LEVOPHED) Adult infusion Stopped (12/17/14 2100)   sodium chloride, sodium chloride

## 2014-12-18 NOTE — Progress Notes (Signed)
Upon assessment, IV in right forearm was leaking and second IV in upper right arm was unusable.  Catheter was removed.  CT notified RN, and stated that central femoral line could not be used.  Order placed for new IV access.  Will notify CT when new peripheral line in place.  Will continue to monitor.

## 2014-12-18 NOTE — Progress Notes (Signed)
12/18/2014 6:22 PM  Report received. Room is ready for patient.  [employer], RN-BC, [employer] [facility] Phone [phone]

## 2014-12-18 NOTE — Progress Notes (Signed)
ANTIBIOTIC CONSULT NOTE - INITIAL  Pharmacy Consult for vancomycin + zosyn Indication: cellulitis  Allergies  Allergen Reactions  . Iodinated Diagnostic Agents Hives  . Iodine     Other reaction(s): Other (See Comments) Other Reaction: contrast-itching and flushing  . Methylprednisolone Hives  . Other Itching    Other reaction(s): Other (See Comments) Uncoded Allergy. Allergen: iv contrast dye, Other Reaction: flushing Arthritis medications Other reaction(s): Other (See Comments) Uncoded Allergy. Allergen: iv contrast dye, Other Reaction: flushing Arthritis medications  . Pollen Extract Hives  . Trimethadione     Other reaction(s): Other (See Comments) Other Reaction: Other reaction  . Trazodone And Nefazodone Hives and Rash    Patient Measurements: Height: 5\' 7"  (170.2 cm) Weight: (!) 328 lb 7.8 oz (149 kg) IBW/kg (Calculated) : 61.6  Vital Signs: Temp: 98.3 F (36.8 C) (06/04 0902) Temp Source: Oral (06/04 0902) BP: 96/44 mmHg (06/04 0950) Pulse Rate: 56 (06/04 0950) Intake/Output from previous day: 06/03 0701 - 06/04 0700 In: 1735.3 [P.O.:480; I.V.:1255.3] Out: -  Intake/Output from this shift: Total I/O In: 120 [P.O.:120] Out: -   Labs:  Recent Labs  12/16/14 1859 12/17/14 0230 12/18/14 0505  WBC 10.9* 9.1 6.7  HGB 10.0* 7.7* 7.0*  PLT 124* 139* 127*  CREATININE 4.61* 4.91* 6.59*   Estimated Creatinine Clearance: 14.5 mL/min (by C-G formula based on Cr of 6.59). No results for input(s): VANCOTROUGH, VANCOPEAK, VANCORANDOM, GENTTROUGH, GENTPEAK, GENTRANDOM, TOBRATROUGH, TOBRAPEAK, TOBRARND, AMIKACINPEAK, AMIKACINTROU, AMIKACIN in the last 72 hours.   Microbiology: Recent Results (from the past 720 hour(s))  Culture, blood (routine x 2)     Status: None (Preliminary result)   Collection Time: 12/16/14  7:08 PM  Result Value Ref Range Status   Specimen Description BLOOD RIGHT HAND  Final   Special Requests BOTTLES DRAWN AEROBIC ONLY 3CC  Final   Culture   Final           BLOOD CULTURE RECEIVED NO GROWTH TO DATE CULTURE WILL BE HELD FOR 5 DAYS BEFORE ISSUING A FINAL NEGATIVE REPORT Performed at [facility]    Report Status PENDING  Incomplete  Culture, blood (routine x 2)     Status: None (Preliminary result)   Collection Time: 12/16/14  9:36 PM  Result Value Ref Range Status   Specimen Description BLOOD RIGHT ARM  Final   Special Requests BOTTLES DRAWN AEROBIC ONLY 8CC  Final   Culture   Final           BLOOD CULTURE RECEIVED NO GROWTH TO DATE CULTURE WILL BE HELD FOR 5 DAYS BEFORE ISSUING A FINAL NEGATIVE REPORT Performed at [facility]    Report Status PENDING  Incomplete  MRSA PCR Screening     Status: Abnormal   Collection Time: 12/17/14  2:20 AM  Result Value Ref Range Status   MRSA by PCR POSITIVE (A) NEGATIVE Final    Comment:        The GeneXpert MRSA Assay (FDA approved for NASAL specimens only), is one component of a comprehensive MRSA colonization surveillance program. It is not intended to diagnose MRSA infection nor to guide or monitor treatment for MRSA infections. RESULT CALLED TO, READ BACK BY AND VERIFIED WITH: MKUFFOUR,M RN [id number] AT 0507 SKEEN,P     Medical History: Past Medical History  Diagnosis Date  . Renal insufficiency   . Mental retardation   . Sleep apnea   . End-stage renal disease on hemodialysis   . Tuberous sclerosis   . Obesity   .  GERD (gastroesophageal reflux disease)   . Depression   . Mental retardation   . Sleep apnea     on c-pap  . Hx MRSA infection   . Obesity   . Complication of anesthesia     "sometimes I don't wake up on time; depends on how much med"  . Asthma   . 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 Osteoarthritis   . Angina   . CHF (congestive heart failure)   . Shortness of breath     "all the time"  . Pneumonia ? 2006  . Renal failure   . ESRD (end stage renal disease) on dialysis 05/25/11    Tu, Th, Sa  . Blood transfusion   . Anemia   . Stomach  ulcer   . Constipated   . Seizures     last seizure 05/21/11  . Pacemaker     AV paced    Medications:  Scheduled:  . antiseptic oral rinse  7 mL Mouth Rinse BID  . budesonide-formoterol  2 puff Inhalation BID  . Chlorhexidine Gluconate Cloth  6 each Topical Q0600  . diphenhydrAMINE  50 mg Intravenous Once  . [START ON 12/19/2014] heparin  4,000 Units Dialysis Once in dialysis  . heparin  5,000 Units Subcutaneous 3 times per day  . hydrocortisone sod succinate (SOLU-CORTEF) inj  100 mg Intravenous Q8H  . lamoTRIgine  50 mg Oral QHS  . levETIRAcetam  1,000 mg Oral Once per day on Sun Mon Wed Fri  . levETIRAcetam  750 mg Oral Once per day on Tue Thu Sat  . levothyroxine  50 mcg Oral QAC breakfast  . methylPREDNISolone (SOLU-MEDROL) injection  40 mg Intravenous Once  . midodrine  20 mg Oral TID WC  . mupirocin ointment  1 application Nasal BID  . pantoprazole  40 mg Oral BID  . piperacillin-tazobactam  3.375 g Intravenous Once  . sertraline  100 mg Oral Daily  . sodium chloride  10-40 mL Intracatheter Q12H  . topiramate  200 mg Oral BID  . vancomycin  1,000 mg Intravenous Once   Assessment: 59 yo f admitted on 6/2 for what looked like a sepsis picture.  Vancomycin and zosyn were initiated empirically but was discontinued by CCM after 1 dose.  Pharmacy is re-consulted to dose vancomycin and zosyn for cellulitis. Patient is ESRD on HD (not sure what schedule is going to be, PTA schedule appears to be TTS per renal note). Also per renal note, patient possibly received a dose of vancomycin and ceftazidime in HD on 6/2 prior to admission and also a vancomycin 2,500 mg load in the ED on 6/2. Will check a post-HD level to determine if patient needs a dose today. Wbc wnl, afebrile.   Goal of Therapy:  Pre-HD vancomycin level 15-25 mcg/mL  Plan:  VR post-HD to determine dosing of vancomcyin Zosyn 2.25 gm IV q8h F/u HD schedule to determine further vancomycin dosing Monitor cx, CBC,  temperature curve, HD schedule, clinical course  Cassie L. Nicole Kindred, PharmD Clinical Pharmacy Resident Pager: [phone] 12/18/2014 10:57 AM

## 2014-12-18 NOTE — Progress Notes (Signed)
CRITICAL VALUE ALERT  Critical value received:  hgb=7  Date of notification:  12/18/14  Time of notification:  3016  Critical value read back:yes  Nurse who received alert:  Purcell Nails  MD notified (1st page): Dr Lamonte Sakai  Time of first page:  0555  MD notified (2nd page):  Time of second page:  Responding MD: Dr Lamonte Sakai  Time MD responded:  [phone]

## 2014-12-18 NOTE — Progress Notes (Signed)
ANTIBIOTIC CONSULT NOTE - INITIAL  Pharmacy Consult for vancomycin + zosyn Indication: cellulitis  Allergies  Allergen Reactions  . Iodinated Diagnostic Agents Hives  . Iodine     Other reaction(s): Other (See Comments) Other Reaction: contrast-itching and flushing  . Methylprednisolone Hives  . Other Itching    Other reaction(s): Other (See Comments) Uncoded Allergy. Allergen: iv contrast dye, Other Reaction: flushing Arthritis medications Other reaction(s): Other (See Comments) Uncoded Allergy. Allergen: iv contrast dye, Other Reaction: flushing Arthritis medications  . Pollen Extract Hives  . Trimethadione     Other reaction(s): Other (See Comments) Other Reaction: Other reaction  . Trazodone And Nefazodone Hives and Rash    Patient Measurements: Height: 5\' 7"  (170.2 cm) Weight: (!) 328 lb 7.8 oz (149 kg) IBW/kg (Calculated) : 61.6  Vital Signs: Temp: 98.7 F (37.1 C) (06/04 1600) Temp Source: Oral (06/04 1600) BP: 87/48 mmHg (06/04 1359) Pulse Rate: 59 (06/04 1800) Intake/Output from previous day: 06/03 0701 - 06/04 0700 In: 1735.3 [P.O.:480; I.V.:1255.3] Out: -  Intake/Output from this shift: Total I/O In: 270 [P.O.:120; Other:100; IV Piggyback:50] Out: 0   Labs:  Recent Labs  12/16/14 1859 12/17/14 0230 12/18/14 0505  WBC 10.9* 9.1 6.7  HGB 10.0* 7.7* 7.0*  PLT 124* 139* 127*  CREATININE 4.61* 4.91* 6.59*   Estimated Creatinine Clearance: 14.5 mL/min (by C-G formula based on Cr of 6.59).  Recent Labs  12/18/14 1700  VANCORANDOM 12     Microbiology: Recent Results (from the past 720 hour(s))  Culture, blood (routine x 2)     Status: None (Preliminary result)   Collection Time: 12/16/14  7:08 PM  Result Value Ref Range Status   Specimen Description BLOOD RIGHT HAND  Final   Special Requests BOTTLES DRAWN AEROBIC ONLY 3CC  Final   Culture   Final           BLOOD CULTURE RECEIVED NO GROWTH TO DATE CULTURE WILL BE HELD FOR 5 DAYS BEFORE  ISSUING A FINAL NEGATIVE REPORT Performed at [facility]    Report Status PENDING  Incomplete  Culture, blood (routine x 2)     Status: None (Preliminary result)   Collection Time: 12/16/14  9:36 PM  Result Value Ref Range Status   Specimen Description BLOOD RIGHT ARM  Final   Special Requests BOTTLES DRAWN AEROBIC ONLY 8CC  Final   Culture   Final           BLOOD CULTURE RECEIVED NO GROWTH TO DATE CULTURE WILL BE HELD FOR 5 DAYS BEFORE ISSUING A FINAL NEGATIVE REPORT Performed at [facility]    Report Status PENDING  Incomplete  MRSA PCR Screening     Status: Abnormal   Collection Time: 12/17/14  2:20 AM  Result Value Ref Range Status   MRSA by PCR POSITIVE (A) NEGATIVE Final    Comment:        The GeneXpert MRSA Assay (FDA approved for NASAL specimens only), is one component of a comprehensive MRSA colonization surveillance program. It is not intended to diagnose MRSA infection nor to guide or monitor treatment for MRSA infections. RESULT CALLED TO, READ BACK BY AND VERIFIED WITH: MKUFFOUR,M RN [id number] AT 0507 SKEEN,P     Medical History: Past Medical History  Diagnosis Date  . Renal insufficiency   . Mental retardation   . Sleep apnea   . End-stage renal disease on hemodialysis   . Tuberous sclerosis   . Obesity   . GERD (gastroesophageal reflux  disease)   . Depression   . Mental retardation   . Sleep apnea     on c-pap  . Hx MRSA infection   . Obesity   . Complication of anesthesia     "sometimes I don't wake up on time; depends on how much med"  . Asthma   . 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 Osteoarthritis   . Angina   . CHF (congestive heart failure)   . Shortness of breath     "all the time"  . Pneumonia ? 2006  . Renal failure   . ESRD (end stage renal disease) on dialysis 05/25/11    Tu, Th, Sa  . Blood transfusion   . Anemia   . Stomach ulcer   . Constipated   . Seizures     last seizure 05/21/11  . Pacemaker     AV paced    Medications:   Scheduled:  . antiseptic oral rinse  7 mL Mouth Rinse BID  . budesonide-formoterol  2 puff Inhalation BID  . [START ON 12/19/2014] calcium acetate  667 mg Oral TID WC  . Chlorhexidine Gluconate Cloth  6 each Topical Q0600  . diphenhydrAMINE  50 mg Intravenous Once  . heparin  5,000 Units Subcutaneous 3 times per day  . hydrocortisone sod succinate (SOLU-CORTEF) inj  100 mg Intravenous Q8H  . lamoTRIgine  50 mg Oral QHS  . levETIRAcetam  1,000 mg Oral Once per day on Sun Mon Wed Fri  . levETIRAcetam  750 mg Oral Once per day on Tue Thu Sat  . levothyroxine  50 mcg Oral QAC breakfast  . methylPREDNISolone (SOLU-MEDROL) injection  40 mg Intravenous Once  . midodrine  20 mg Oral TID WC  . mupirocin ointment  1 application Nasal BID  . pantoprazole  40 mg Oral BID  . piperacillin-tazobactam (ZOSYN)  IV  2.25 g Intravenous 3 times per day  . sertraline  100 mg Oral Daily  . [START ON 12/19/2014] sevelamer carbonate  1,600 mg Oral TID WC  . sodium chloride  10-40 mL Intracatheter Q12H  . topiramate  200 mg Oral BID  . vancomycin  1,000 mg Intravenous Q T,Th,Sa-HD   Assessment: 59 yo f admitted on 6/2 for what looked like a sepsis picture.  Vancomycin and zosyn were initiated empirically but was discontinued by CCM after 1 dose.  Pharmacy is re-consulted to dose vancomycin and zosyn for cellulitis. Patient is ESRD on HD (not sure what schedule is going to be, PTA schedule appears to be TTS per renal note). Also per renal note, patient possibly received a dose of vancomycin and ceftazidime in HD on 6/2 prior to admission and also a vancomycin 2,500 mg load in the ED on 6/2. However it appears this amount may have been necessary as her post-HD level is only 12 which indicates she requires a post-HD dose today.  Goal of Therapy:  Pre-HD vancomycin level 15-25 mcg/mL  Plan:  Vancomycin 1gm IV after each HD session, 1st dose tonight.  Legrand Como, Pharm.D., BCPS, AAHIVP Clinical  Pharmacist Phone: [phone] or [phone] 12/18/2014, 7:02 PM

## 2014-12-19 ENCOUNTER — Encounter (HOSPITAL_COMMUNITY): Payer: Self-pay | Admitting: Radiology

## 2014-12-19 ENCOUNTER — Inpatient Hospital Stay (HOSPITAL_COMMUNITY): Payer: Medicare Other

## 2014-12-19 DIAGNOSIS — I959 Hypotension, unspecified: Secondary | ICD-10-CM

## 2014-12-19 LAB — PROCALCITONIN: Procalcitonin: 11.67 ng/mL

## 2014-12-19 MED ORDER — CALCIUM ACETATE (PHOS BINDER) 667 MG PO CAPS
1334.0000 mg | ORAL_CAPSULE | Freq: Three times a day (TID) | ORAL | Status: DC
  Administered 2014-12-19 – 2014-12-23 (×12): 1334 mg via ORAL
  Filled 2014-12-19 (×14): qty 2

## 2014-12-19 MED ORDER — HYDROCORTISONE NA SUCCINATE PF 100 MG IJ SOLR
50.0000 mg | Freq: Three times a day (TID) | INTRAMUSCULAR | Status: DC
  Administered 2014-12-19 – 2014-12-20 (×3): 50 mg via INTRAVENOUS
  Filled 2014-12-19 (×6): qty 1

## 2014-12-19 MED ORDER — SODIUM CHLORIDE 0.9 % IJ SOLN
10.0000 mL | INTRAMUSCULAR | Status: DC | PRN
  Administered 2014-12-19: 30 mL
  Administered 2014-12-22 (×3): 10 mL
  Filled 2014-12-19 (×4): qty 40

## 2014-12-19 MED ORDER — IOHEXOL 350 MG/ML SOLN
100.0000 mL | Freq: Once | INTRAVENOUS | Status: AC | PRN
  Administered 2014-12-19: 100 mL via INTRAVENOUS

## 2014-12-19 NOTE — Progress Notes (Signed)
Subjective:  Sitting in bedside chair/ tolerated HD yest/ no cos  Objective Vital signs in last 24 hours: Filed Vitals:   12/18/14 2301 12/19/14 0100 12/19/14 0439 12/19/14 0900  BP: 101/49  92/47 99/48  Pulse: 59  60 72  Temp:   98.3 F (36.8 C) 98.6 F (37 C)  TempSrc:   Other (Comment) Oral  Resp:   18 18  Height:      Weight:  149.2 kg (328 lb 14.8 oz)    SpO2:   100% 100%   Weight change: 1.7 kg (3 lb 12 oz)  Physical Exam:  General=  Alert, obese, WF chronically ill appearing but Baseline  NAD/ Cooperative Lungs-  CTA   Card-RRR no MRG Abd -  obese soft nt/nd/ mid line upper wound dressing not removed/ no ascites Dialysis access-  R thigh avg +bruit and erythema  Lower Extrem - chronic bipedal edema  HD: [facility] TTS 4h 18min F200 141kg 2/2 bath Hep 4000 Calcitriol 0.75 tiw, mircera 225 last 5/31 Rec'd IV vanc and fortaz yest at HD for redness around R thigh AVG  Problem/Plan: 1. Hypotension/ fever - r/o sepsis. Erythema R thigh below AVG. Blood cx's negative, temps improving and BP up, on empiric Abx. Erythema better R thigh below AVG. Not involving AVG, blood cx's negative and CT thigh negative for abcess. Appreciate vasc surg input. Fevers/ BP improving, would cont empiric abx. Access not directly implicated as source of infection.   2. Acute on chronic hypotension - prob AI, on iv steroids. Takes midodrine 20mg  tid at home and here. S/p pressor 3. Volume is up several kg due to iV  fluid boluses  4. ESRD  Chronic HD- [facility]  4 x  MTTS  - hd yest no uf  HD Monday as outpt able to tolerate  5 to 6 l uf but with recent hypotension attempt 4 to 4.5 tomor. 5. Tuberous sclerosis 6. MR 7. Seizure d/o 8. Anemia Hb 10 mircera  5/31 given  9. MBD ca and phos . Uses phoslo binder/ po vit d on hd   Ernest Haber, PA-C [facility] Beeper [phone] 12/19/2014,11:13 AM  LOS: 2 days   Pt seen, examined and agree w A/P as above.  Kelly Splinter MD pager  [phone]    cell [phone] 12/19/2014, 4:48 PM    Labs: Basic Metabolic Panel:  Recent Labs Lab 12/16/14 1859 12/17/14 0230 12/18/14 0505  NA 139 137 135  K 5.1 4.6 5.1  CL 97* 98* 97*  CO2 27 28 26   GLUCOSE 101* 93 108*  BUN 28* 32* 51*  CREATININE 4.61* 4.91* 6.59*  CALCIUM 8.8* 9.0 8.8*  PHOS  --  4.3 5.3*   Liver Function Tests:  Recent Labs Lab 12/16/14 1859 12/17/14 0230  AST 24 22  ALT 13* 13*  ALKPHOS 206* 208*  BILITOT 0.7 0.6  PROT 6.5 6.6  ALBUMIN 3.1* 3.0*    Recent Labs Lab 12/16/14 1859  LIPASE 29   No results for input(s): AMMONIA in the last 168 hours. CBC:  Recent Labs Lab 12/16/14 1859 12/17/14 0230 12/18/14 0505  WBC 10.9* 9.1 6.7  NEUTROABS 9.5*  --   --   HGB 10.0* 7.7* 7.0*  HCT 33.0* 25.3* 22.2*  MCV 107.5* 107.2* 104.2*  PLT 124* 139* 127*   Cardiac Enzymes:  Recent Labs Lab 12/17/14 0230 12/17/14 0641  TROPONINI 0.10* 0.09*   CBG: No results for input(s): GLUCAP in the last 168 hours.  Studies/Results:  Ct Angio Low Extrem Right W/cm &/or Wo/cm  12/19/2014   CLINICAL DATA:  End-stage renal disease, right femoral thigh AV graft, cellulitis over the venous limb of the graft.  EXAM: CT ANGIOGRAPHY OF THE RIGHT LOWEREXTREMITY  TECHNIQUE: Multidetector CT imaging of the RIGHT LOWER EXTREMITYwas performed using the standard protocol during bolus administration of intravenous contrast. Multiplanar CT image reconstructions and MIPs were obtained to evaluate the vascular anatomy.  CONTRAST:  126mL OMNIPAQUE IOHEXOL 350 MG/ML SOLN  COMPARISON:  09/06/2014  FINDINGS: CTA RIGHT LOWER EXTREMITY: Beginning in the pelvis, the right external iliac artery and vein remain patent.  The right common femoral, profunda femoral, and superficial femoral arteries are patent. Right popliteal artery is patent across the knee. Below-knee popliteal artery remains patent with small caliber but three-vessel runoff preserved to the right lower extremity.   Right femoral AV loop graft containing several stents remains patent. Arterial anastomosis to the proximal superficial femoral artery is patent. Loop graft remains patent with some intraluminal irregularity. Stented venous anastomosis remains patent to the femoral vein. There is proximal thigh subcutaneous edema and some minor skin thickening of the right medial thigh adjacent to the venous limb of the graft but no perigraft fluid collection or soft tissue abscess appreciated. More inferiorly, there is strandy subcutaneous edema throughout the knee and lower extremity. Small knee effusion noted.  Included views of the right lower quadrant demonstrate a right lower abdominal wall ventral hernia containing only fat. Diverticulosis noted of the visualized the colon. Patient also has a left femoral central line in place.  Review of the MIP images confirms the above findings.  IMPRESSION: Patent right femoral AV loop graft containing several intragraft stents without evidence of thrombosis or occlusion. Proximal right thigh diffuse strandy edema and skin thickening medially adjacent to the venous limb of the graft compatible with cellulitis.  No perigraft or proximal thigh significant fluid collection or abscess.  Patent right lower extremity peripheral vasculature.   Electronically Signed   By: Jerilynn Mages.  Shick M.D.   On: 12/19/2014 10:17   Medications:   . antiseptic oral rinse  7 mL Mouth Rinse BID  . budesonide-formoterol  2 puff Inhalation BID  . calcium acetate  667 mg Oral TID WC  . Chlorhexidine Gluconate Cloth  6 each Topical Q0600  . heparin  5,000 Units Subcutaneous 3 times per day  . hydrocortisone sod succinate (SOLU-CORTEF) inj  50 mg Intravenous Q8H  . lamoTRIgine  50 mg Oral QHS  . levETIRAcetam  1,000 mg Oral Once per day on Sun Mon Wed Fri  . levETIRAcetam  750 mg Oral Once per day on Tue Thu Sat  . levothyroxine  50 mcg Oral QAC breakfast  . midodrine  20 mg Oral TID WC  . mupirocin ointment  1  application Nasal BID  . pantoprazole  40 mg Oral BID  . piperacillin-tazobactam (ZOSYN)  IV  2.25 g Intravenous 3 times per day  . sertraline  100 mg Oral Daily  . sevelamer carbonate  1,600 mg Oral TID WC  . sodium chloride  10-40 mL Intracatheter Q12H  . topiramate  200 mg Oral BID  . vancomycin  1,000 mg Intravenous Q T,Th,Sa-HD

## 2014-12-19 NOTE — Progress Notes (Signed)
Placed patient on CPAP for the night via auto-mode with minimum pressure set at 5cm and maximum pressure set at 20cm. Oxygen set at 2lpm.  

## 2014-12-19 NOTE — Consult Note (Signed)
WOC wound consult note Reason for Consult: midline abdominal wound, patient reports surgical procedure for kidney?  She is not sure when but was performed at [facility], she reports wound was healed and reopened about a year ago. She has wound care performed at the SNF she resides. Wound type: non healing surgical site  Pressure Ulcer POA: No Measurement: two areas with intact skin bridge between them proximal 3.5cm x 1.5cm x 0.2cm; distal 5cm x 2.0cm x 0.2cm  Wound XAJ:LUNG are dry, non granular with epibole of the wound edges, each bleeds some with removal of the dressing  Drainage (amount, consistency, odor) none Periwound: intact, with apparent abdominal hernia Dressing procedure/placement/frequency: Hydrogel to increase moisture in the wound bed, apply daily, cover with dry dressing.   Discussed POC with patient and bedside nurse.  Re consult if needed, will not follow at this time. Thanks  Cloy Cozzens [place], [place] [phone])

## 2014-12-19 NOTE — Progress Notes (Addendum)
Patient transferred from [facility].  Skin assessment performed by this RN and charge nurse, Joelene Millin.  Upon assessment, dehiscence of mid abdomen noted.  Old abdominal pad and gauze removed.  Slight bleeding noted upon removal of gauze.  Wound is pink and red.  First dehiscence site measured at 6 cm x 2 cm, and second site, measured at 3.5 cm x 2 cm, is above the first site.  Wound rinsed gently with saline.  Wet gauze, abdominal pad, and paper tape used as new dressing.  Abdominal hernia noted.  Bottom is red but blanchable; sacral foam applied.  Bilateral heels red but blanchable; heels elevated on pillow. Area around right upper thigh graft is red.  Skin tear also noted in abdominal folds on right lower side and cleansed.

## 2014-12-19 NOTE — Progress Notes (Addendum)
[facility] PROGRESS NOTE  AAISHAH INZER GEX:[id number] DOB: 24-Apr-1956 DOA: 12/16/2014 PCP: Keane Police, MD   PCCM Transfer to [facility] 6/5 Assessment/Plan: 1. Shock-likely septic -required pressors initially and stress dose steroids -concern for possible AVF related infection, VVS following, CT thigh done results pending -has very limited access options per pt -Day 2 of Vanc/Zosyn, Blood cx's negative so far, CT abd unremarkable -improving -cut down stress dose steroids  2. 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 ESRD on HD -per Renal -access issues  4. Tuberous sclerosis/Seizure d/o -on keppra/topamax/lamictal  5. Anemia of chronic disease - hb gradually trended down, no overt bleeding per pt -check anemia panel, transfuse 1 unit PRBC with HD tomorrow  6. CHronic abd wound/Wound dehiscence for 1year -wound RN to see, no s/s of infection, mostly approximated  DVT proph: Hep SQ  Code Status: Full Code Family Communication: none at bedside Disposition Plan: inpatient   Consultants:  Renal   VVS  HPI/Subjective: Worried abt access  Objective: Filed Vitals:   12/19/14 0439  BP: 92/47  Pulse: 60  Temp: 98.3 F (36.8 C)  Resp: 18    Intake/Output Summary (Last 24 hours) at 12/19/14 0933 Last data filed at 12/19/14 0600  Gross per 24 hour  Intake    760 ml  Output      1 ml  Net    759 ml   Filed Weights   12/18/14 0945 12/18/14 1359 12/19/14 0100  Weight: 149 kg (328 lb 7.8 oz) 149 kg (328 lb 7.8 oz) 149.2 kg (328 lb 14.8 oz)    Exam:   General:  AAOX3  Cardiovascular: S1S2/RRR  Respiratory: CTAB  Abdomen: soft, NT, BS present, abd wound approximated without signs of infection, dressing  Musculoskeletal: no edema c/c, L thigh HD cath  Data Reviewed: Basic Metabolic Panel:  Recent Labs Lab 12/16/14 1859 12/17/14 0230 12/18/14 0505  NA 139  137 135  K 5.1 4.6 5.1  CL 97* 98* 97*  CO2 27 28 26   GLUCOSE 101* 93 108*  BUN 28* 32* 51*  CREATININE 4.61* 4.91* 6.59*  CALCIUM 8.8* 9.0 8.8*  MG  --  2.1 2.0  PHOS  --  4.3 5.3*   Liver Function Tests:  Recent Labs Lab 12/16/14 1859 12/17/14 0230  AST 24 22  ALT 13* 13*  ALKPHOS 206* 208*  BILITOT 0.7 0.6  PROT 6.5 6.6  ALBUMIN 3.1* 3.0*    Recent Labs Lab 12/16/14 1859  LIPASE 29   No results for input(s): AMMONIA in the last 168 hours. CBC:  Recent Labs Lab 12/16/14 1859 12/17/14 0230 12/18/14 0505  WBC 10.9* 9.1 6.7  NEUTROABS 9.5*  --   --   HGB 10.0* 7.7* 7.0*  HCT 33.0* 25.3* 22.2*  MCV 107.5* 107.2* 104.2*  PLT 124* 139* 127*   Cardiac Enzymes:  Recent Labs Lab 12/17/14 0230 12/17/14 0641  TROPONINI 0.10* 0.09*   BNP (last 3 results) No results for input(s): BNP in the last 8760 hours.  ProBNP (last 3 results) No results for input(s): PROBNP in the last 8760 hours.  CBG: No results for input(s): GLUCAP in the last 168 hours.  Recent Results (from the past 240 hour(s))  Culture, blood (routine x 2)     Status: None (Preliminary result)   Collection Time: 12/16/14  7:08 PM  Result Value Ref Range Status   Specimen Description BLOOD RIGHT HAND  Final   Special Requests BOTTLES DRAWN AEROBIC ONLY 3CC  Final   Culture   Final           BLOOD CULTURE RECEIVED NO GROWTH TO DATE CULTURE WILL BE HELD FOR 5 DAYS BEFORE ISSUING A FINAL NEGATIVE REPORT Performed at [facility]    Report Status PENDING  Incomplete  Culture, blood (routine x 2)     Status: None (Preliminary result)   Collection Time: 12/16/14  9:36 PM  Result Value Ref Range Status   Specimen Description BLOOD RIGHT ARM  Final   Special Requests BOTTLES DRAWN AEROBIC ONLY 8CC  Final   Culture   Final           BLOOD CULTURE RECEIVED NO GROWTH TO DATE CULTURE WILL BE HELD FOR 5 DAYS BEFORE ISSUING A FINAL NEGATIVE REPORT Performed at [facility]    Report  Status PENDING  Incomplete  MRSA PCR Screening     Status: Abnormal   Collection Time: 12/17/14  2:20 AM  Result Value Ref Range Status   MRSA by PCR POSITIVE (A) NEGATIVE Final    Comment:        The GeneXpert MRSA Assay (FDA approved for NASAL specimens only), is one component of a comprehensive MRSA colonization surveillance program. It is not intended to diagnose MRSA infection nor to guide or monitor treatment for MRSA infections. RESULT CALLED TO, READ BACK BY AND VERIFIED WITH: MKUFFOUR,M RN [id number] AT 0507 SKEEN,P      Studies: No results found.  Scheduled Meds: . antiseptic oral rinse  7 mL Mouth Rinse BID  . budesonide-formoterol  2 puff Inhalation BID  . calcium acetate  667 mg Oral TID WC  . Chlorhexidine Gluconate Cloth  6 each Topical Q0600  . heparin  5,000 Units Subcutaneous 3 times per day  . hydrocortisone sod succinate (SOLU-CORTEF) inj  100 mg Intravenous Q8H  . lamoTRIgine  50 mg Oral QHS  . levETIRAcetam  1,000 mg Oral Once per day on Sun Mon Wed Fri  . levETIRAcetam  750 mg Oral Once per day on Tue Thu Sat  . levothyroxine  50 mcg Oral QAC breakfast  . midodrine  20 mg Oral TID WC  . mupirocin ointment  1 application Nasal BID  . pantoprazole  40 mg Oral BID  . piperacillin-tazobactam (ZOSYN)  IV  2.25 g Intravenous 3 times per day  . sertraline  100 mg Oral Daily  . sevelamer carbonate  1,600 mg Oral TID WC  . sodium chloride  10-40 mL Intracatheter Q12H  . topiramate  200 mg Oral BID  . vancomycin  1,000 mg Intravenous Q T,Th,Sa-HD   Continuous Infusions:  Antibiotics Given (last 72 hours)    Date/Time Action Medication Dose Rate   12/18/14 1633 Given   piperacillin-tazobactam (ZOSYN) IVPB 2.25 g 2.25 g 100 mL/hr   12/18/14 2003 Given   vancomycin (VANCOCIN) IVPB 1000 mg/200 mL premix 1,000 mg 200 mL/hr   12/18/14 2324 Given   piperacillin-tazobactam (ZOSYN) IVPB 2.25 g 2.25 g 100 mL/hr   12/19/14 0801 Given   piperacillin-tazobactam  (ZOSYN) IVPB 2.25 g 2.25 g 100 mL/hr      Active Problems:   Hypotension   Severe sepsis with septic shock   Cellulitis of lower extremity    Time spent:    [facility]  [facility] Pager [phone]. If 7PM-7AM, please contact night-coverage at [email or web address], password [facility] 12/19/2014, 9:33 AM  LOS: 2 days

## 2014-12-19 NOTE — Progress Notes (Addendum)
[facility] Progress Note  Subjective  No complaints. Having some chills. Says she is usually "hot-natured"  Objective Filed Vitals:   12/19/14 0900  BP: 99/48  Pulse: 72  Temp: 98.6 F (37 C)  Resp: 18  Tmax 98.7 BP sys 70s-100 02 100% RA  Intake/Output Summary (Last 24 hours) at 12/19/14 1034 Last data filed at 12/19/14 1023  Gross per 24 hour  Intake   1060 ml  Output      1 ml  Net   1059 ml   General: sitting in chair in NAD Right thigh AVG palpable. Thigh is non-tender to palpation, no fluctuance, mild erythema.   Assessment/Planning: 59 y.o. female with ESRD, chronic hypotension, shock.  Right thigh AVG does not appear to be infected. CT scan today shows no peri-graft inflammation or abscess. Unclear of etiology of shock.   Alvia Grove 12/19/2014 10:34 AM --  Laboratory CBC    Component Value Date/Time   WBC 6.7 12/18/2014 0505   WBC 3.8 10/21/2014 0942   HGB 7.0* 12/18/2014 0505   HGB 9.5* 10/21/2014 0942   HCT 22.2* 12/18/2014 0505   HCT 30.1* 10/21/2014 0942   PLT 127* 12/18/2014 0505   PLT 107* 10/21/2014 0942    BMET    Component Value Date/Time   NA 135 12/18/2014 0505   NA 137 10/21/2014 0941   K 5.1 12/18/2014 0505   K 4.0 10/21/2014 0941   CL 97* 12/18/2014 0505   CL 96* 10/21/2014 0941   CO2 26 12/18/2014 0505   CO2 28 10/21/2014 0941   GLUCOSE 108* 12/18/2014 0505   GLUCOSE 121* 10/21/2014 0941   BUN 51* 12/18/2014 0505   BUN 61* 10/21/2014 0941   CREATININE 6.59* 12/18/2014 0505   CREATININE 6.42* 10/21/2014 0941   CALCIUM 8.8* 12/18/2014 0505   CALCIUM 8.7* 10/21/2014 0941   CALCIUM 10.2 02/09/2010 1028   GFRNONAA 6* 12/18/2014 0505   GFRNONAA 7* 10/21/2014 0941   GFRAA 7* 12/18/2014 0505   GFRAA 8* 10/21/2014 0941    COAG Lab Results  Component Value Date   INR 1.11 09/08/2014   INR 1.1 05/22/2014   INR 1.0 05/20/2014   No results found for: PTT  Antibiotics Anti-infectives    Start      Dose/Rate Route Frequency Ordered Stop   12/18/14 2000  vancomycin (VANCOCIN) IVPB 1000 mg/200 mL premix     1,000 mg 200 mL/hr over 60 Minutes Intravenous Every T-Th-Sa (Hemodialysis) 12/18/14 1858     12/18/14 1400  piperacillin-tazobactam (ZOSYN) IVPB 2.25 g     2.25 g 100 mL/hr over 30 Minutes Intravenous 3 times per day 12/18/14 1111     12/18/14 1030  piperacillin-tazobactam (ZOSYN) IVPB 3.375 g  Status:  Discontinued     3.375 g 100 mL/hr over 30 Minutes Intravenous  Once 12/18/14 1024 12/18/14 1051   12/18/14 1030  vancomycin (VANCOCIN) IVPB 1000 mg/200 mL premix  Status:  Discontinued     1,000 mg 200 mL/hr over 60 Minutes Intravenous  Once 12/18/14 1024 12/18/14 1058   12/17/14 0400  piperacillin-tazobactam (ZOSYN) IVPB 2.25 g  Status:  Discontinued     2.25 g 100 mL/hr over 30 Minutes Intravenous Every 8 hours 12/16/14 1923 12/17/14 0056   12/16/14 1915  piperacillin-tazobactam (ZOSYN) IVPB 3.375 g     3.375 g 100 mL/hr over 30 Minutes Intravenous  Once 12/16/14 1913 12/16/14 2017   12/16/14 1915  vancomycin (VANCOCIN) 2,500 mg in sodium chloride 0.9 %  500 mL IVPB     2,500 mg 250 mL/hr over 120 Minutes Intravenous STAT 12/16/14 1913 12/16/14 2214       Virgina Jock, PA-C [facility] Office: [phone] Pager: [phone] 12/19/2014 10:34 AM    Erythema in thigh improved CT scan of thigh images reviewed no perigraft inflammation edema or fluid to suggest graft infection  Will sign off.  Ruta Hinds, MD [facility] [place] Office: [phone] Pager: [phone]

## 2014-12-20 ENCOUNTER — Ambulatory Visit (HOSPITAL_COMMUNITY): Payer: Medicare Other

## 2014-12-20 ENCOUNTER — Other Ambulatory Visit (HOSPITAL_COMMUNITY): Payer: Medicare Other

## 2014-12-20 DIAGNOSIS — L03119 Cellulitis of unspecified part of limb: Secondary | ICD-10-CM

## 2014-12-20 LAB — CBC
HEMATOCRIT: 28.7 % — AB (ref 36.0–46.0)
Hemoglobin: 8.7 g/dL — ABNORMAL LOW (ref 12.0–15.0)
MCH: 32 pg (ref 26.0–34.0)
MCHC: 30.3 g/dL (ref 30.0–36.0)
MCV: 105.5 fL — AB (ref 78.0–100.0)
PLATELETS: 185 10*3/uL (ref 150–400)
RBC: 2.72 MIL/uL — ABNORMAL LOW (ref 3.87–5.11)
RDW: 18.1 % — ABNORMAL HIGH (ref 11.5–15.5)
WBC: 5.6 10*3/uL (ref 4.0–10.5)

## 2014-12-20 LAB — BASIC METABOLIC PANEL
Anion gap: 14 (ref 5–15)
BUN: 45 mg/dL — ABNORMAL HIGH (ref 6–20)
CO2: 26 mmol/L (ref 22–32)
Calcium: 8.5 mg/dL — ABNORMAL LOW (ref 8.9–10.3)
Chloride: 93 mmol/L — ABNORMAL LOW (ref 101–111)
Creatinine, Ser: 6.43 mg/dL — ABNORMAL HIGH (ref 0.44–1.00)
GFR calc Af Amer: 8 mL/min — ABNORMAL LOW (ref >60)
GFR, EST NON AFRICAN AMERICAN: 7 mL/min — AB (ref >60)
Glucose, Bld: 135 mg/dL — ABNORMAL HIGH (ref 65–99)
Potassium: 3.8 mmol/L (ref 3.5–5.1)
SODIUM: 133 mmol/L — AB (ref 135–145)

## 2014-12-20 LAB — PREPARE RBC (CROSSMATCH)

## 2014-12-20 MED ORDER — DARBEPOETIN ALFA 200 MCG/0.4ML IJ SOSY
200.0000 ug | PREFILLED_SYRINGE | INTRAMUSCULAR | Status: DC
  Administered 2014-12-21: 200 ug via INTRAVENOUS
  Filled 2014-12-20: qty 0.4

## 2014-12-20 MED ORDER — HYDROCORTISONE 10 MG PO TABS
10.0000 mg | ORAL_TABLET | Freq: Every day | ORAL | Status: DC
  Administered 2014-12-20 – 2014-12-23 (×4): 10 mg via ORAL
  Filled 2014-12-20 (×4): qty 1

## 2014-12-20 MED ORDER — ALTEPLASE 2 MG IJ SOLR
2.0000 mg | Freq: Once | INTRAMUSCULAR | Status: DC | PRN
  Filled 2014-12-20: qty 2

## 2014-12-20 MED ORDER — HYDROCORTISONE 5 MG PO TABS
5.0000 mg | ORAL_TABLET | Freq: Every day | ORAL | Status: DC
  Filled 2014-12-20: qty 1

## 2014-12-20 MED ORDER — HYDROCORTISONE 5 MG PO TABS
15.0000 mg | ORAL_TABLET | Freq: Every day | ORAL | Status: DC
  Administered 2014-12-20 – 2014-12-22 (×3): 15 mg via ORAL
  Filled 2014-12-20 (×4): qty 1

## 2014-12-20 MED ORDER — NEPRO/CARBSTEADY PO LIQD: 237.0000 mL | ORAL | Status: DC | PRN

## 2014-12-20 MED ORDER — ACETAMINOPHEN 325 MG PO TABS
650.0000 mg | ORAL_TABLET | Freq: Four times a day (QID) | ORAL | Status: DC | PRN
  Administered 2014-12-20: 650 mg via ORAL
  Filled 2014-12-20: qty 2

## 2014-12-20 MED ORDER — LIDOCAINE HCL (PF) 1 % IJ SOLN: 5.0000 mL | INTRAMUSCULAR | Status: DC | PRN

## 2014-12-20 MED ORDER — VANCOMYCIN HCL IN DEXTROSE 750-5 MG/150ML-% IV SOLN
750.0000 mg | Freq: Once | INTRAVENOUS | Status: AC
  Administered 2014-12-20: 750 mg via INTRAVENOUS
  Filled 2014-12-20 (×2): qty 150

## 2014-12-20 MED ORDER — SODIUM CHLORIDE 0.9 % IV SOLN: 100.0000 mL | INTRAVENOUS | Status: DC | PRN

## 2014-12-20 MED ORDER — MIDODRINE HCL 5 MG PO TABS
ORAL_TABLET | ORAL | Status: AC
Start: 2014-12-20 — End: 2014-12-20
  Filled 2014-12-20: qty 4

## 2014-12-20 MED ORDER — LIDOCAINE-PRILOCAINE 2.5-2.5 % EX CREA: 1.0000 "application " | TOPICAL_CREAM | CUTANEOUS | Status: DC | PRN

## 2014-12-20 MED ORDER — PENTAFLUOROPROP-TETRAFLUOROETH EX AERO: 1.0000 "application " | INHALATION_SPRAY | CUTANEOUS | Status: DC | PRN

## 2014-12-20 MED ORDER — HYDROCORTISONE 10 MG PO TABS: 10.0000 mg | ORAL_TABLET | Freq: Two times a day (BID) | ORAL | Status: DC

## 2014-12-20 MED ORDER — SODIUM CHLORIDE 0.9 % IV SOLN: Freq: Once | INTRAVENOUS | Status: DC

## 2014-12-20 MED ORDER — HEPARIN SODIUM (PORCINE) 1000 UNIT/ML DIALYSIS
1000.0000 [IU] | INTRAMUSCULAR | Status: DC | PRN
  Filled 2014-12-20: qty 1

## 2014-12-20 MED ORDER — SODIUM CHLORIDE 0.9 % IV BOLUS (SEPSIS)
250.0000 mL | Freq: Once | INTRAVENOUS | Status: AC
  Administered 2014-12-21: 250 mL via INTRAVENOUS

## 2014-12-20 MED ORDER — HEPARIN SODIUM (PORCINE) 1000 UNIT/ML DIALYSIS
4000.0000 [IU] | Freq: Once | INTRAMUSCULAR | Status: DC
  Filled 2014-12-20: qty 4

## 2014-12-20 MED ORDER — RENA-VITE PO TABS
1.0000 | ORAL_TABLET | Freq: Every day | ORAL | Status: DC
  Administered 2014-12-20 – 2014-12-22 (×3): 1 via ORAL
  Filled 2014-12-20 (×4): qty 1

## 2014-12-20 NOTE — Progress Notes (Signed)
[facility] PROGRESS NOTE  KANESHA CADLE OIZ:[id number] DOB: [birth year] DOA: 12/16/2014 PCP: Alvester Morin, MD   PCCM Transfer to [facility] 6/5 Assessment/Plan: 1. Shock-likely septic -required pressors initially and stress dose steroids -CT without clear evidence of graft infection, VVS signed off also has very limited access options -Day 3 of Vanc/Zosyn, Blood cx's negative so far, CT abd unremarkable -improving -wean off stress dose steroids -would Rx for 24more days with HD  2. 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 ESRD on HD -per Renal -access issues  4. Tuberous sclerosis/Seizure d/o -on keppra/topamax/lamictal  5. Anemia of chronic disease - hb gradually trended down, no overt bleeding per pt, hb 7 yesterday, todays Hb pending,  -will transfuse 1unit with HD today -Fu anemia panel, check CBC in am   6. CHronic abd wound/Wound dehiscence for 1year -wound RN to see, no s/s of infection, mostly approximated  DVT proph: Hep SQ  Code Status: Full Code Family Communication: none at bedside Disposition Plan: inpatient   Consultants:  Renal   VVS  HPI/Subjective: Feels ok, some neck stiffness after sleeping Objective: Filed Vitals:   12/20/14 0854  BP: 90/48  Pulse: 59  Temp: 97.7 F (36.5 C)  Resp: 19    Intake/Output Summary (Last 24 hours) at 12/20/14 0945 Last data filed at 12/20/14 0855  Gross per 24 hour  Intake   1580 ml  Output      1 ml  Net   1579 ml   Filed Weights   12/18/14 1359 12/19/14 0100 12/19/14 2023  Weight: 149 kg (328 lb 7.8 oz) 149.2 kg (328 lb 14.8 oz) 148.1 kg (326 lb 8 oz)    Exam:   General:  AAOX3, no distress  Cardiovascular: S1S2/RRR  Respiratory: CTAB  Abdomen: soft, NT, BS present, abd wound approximated without signs of infection, dressing  Musculoskeletal: no edema  c/c, L thigh HD cath  Data Reviewed: Basic Metabolic Panel:  Recent Labs Lab 12/16/14 1859 12/17/14 0230 12/18/14 0505  NA 139 137 135  K 5.1 4.6 5.1  CL 97* 98* 97*  CO2 27 28 26   GLUCOSE 101* 93 108*  BUN 28* 32* 51*  CREATININE 4.61* 4.91* 6.59*  CALCIUM 8.8* 9.0 8.8*  MG  --  2.1 2.0  PHOS  --  4.3 5.3*   Liver Function Tests:  Recent Labs Lab 12/16/14 1859 12/17/14 0230  AST 24 22  ALT 13* 13*  ALKPHOS 206* 208*  BILITOT 0.7 0.6  PROT 6.5 6.6  ALBUMIN 3.1* 3.0*    Recent Labs Lab 12/16/14 1859  LIPASE 29   No results for input(s): AMMONIA in the last 168 hours. CBC:  Recent Labs Lab 12/16/14 1859 12/17/14 0230 12/18/14 0505 12/20/14 0910  WBC 10.9* 9.1 6.7 5.6  NEUTROABS 9.5*  --   --   --   HGB 10.0* 7.7* 7.0* 8.7*  HCT 33.0* 25.3* 22.2* 28.7*  MCV 107.5* 107.2* 104.2* 105.5*  PLT 124* 139* 127* 185   Cardiac Enzymes:  Recent Labs Lab 12/17/14 0230 12/17/14 0641  TROPONINI 0.10* 0.09*   BNP (last 3 results) No results for input(s): BNP in the last 8760 hours.  ProBNP (last 3 results) No results for input(s): PROBNP in the last 8760 hours.  CBG: No results for input(s): GLUCAP in the last 168 hours.  Recent Results (from the past 240 hour(s))  Culture, blood (routine x 2)     Status: None (Preliminary result)   Collection Time: 12/16/14  7:08 PM  Result Value Ref Range Status   Specimen Description BLOOD RIGHT HAND  Final   Special Requests BOTTLES DRAWN AEROBIC ONLY 3CC  Final   Culture   Final           BLOOD CULTURE RECEIVED NO GROWTH TO DATE CULTURE WILL BE HELD FOR 5 DAYS BEFORE ISSUING A FINAL NEGATIVE REPORT Performed at [facility]    Report Status PENDING  Incomplete  Culture, blood (routine x 2)     Status: None (Preliminary result)   Collection Time: 12/16/14  9:36 PM  Result Value Ref Range Status   Specimen Description BLOOD RIGHT ARM  Final   Special Requests BOTTLES DRAWN AEROBIC ONLY 8CC  Final    Culture   Final           BLOOD CULTURE RECEIVED NO GROWTH TO DATE CULTURE WILL BE HELD FOR 5 DAYS BEFORE ISSUING A FINAL NEGATIVE REPORT Performed at [facility]    Report Status PENDING  Incomplete  MRSA PCR Screening     Status: Abnormal   Collection Time: 12/17/14  2:20 AM  Result Value Ref Range Status   MRSA by PCR POSITIVE (A) NEGATIVE Final    Comment:        The GeneXpert MRSA Assay (FDA approved for NASAL specimens only), is one component of a comprehensive MRSA colonization surveillance program. It is not intended to diagnose MRSA infection nor to guide or monitor treatment for MRSA infections. RESULT CALLED TO, READ BACK BY AND VERIFIED WITH: MKUFFOUR,M RN [id number] AT 0507 SKEEN,P      Studies: Ct Angio Low Extrem Right W/cm &/or Wo/cm  12/19/2014   CLINICAL DATA:  End-stage renal disease, right femoral thigh AV graft, cellulitis over the venous limb of the graft.  EXAM: CT ANGIOGRAPHY OF THE RIGHT LOWEREXTREMITY  TECHNIQUE: Multidetector CT imaging of the RIGHT LOWER EXTREMITYwas performed using the standard protocol during bolus administration of intravenous contrast. Multiplanar CT image reconstructions and MIPs were obtained to evaluate the vascular anatomy.  CONTRAST:  161mL OMNIPAQUE IOHEXOL 350 MG/ML SOLN  COMPARISON:  09/06/2014  FINDINGS: CTA RIGHT LOWER EXTREMITY: Beginning in the pelvis, the right external iliac artery and vein remain patent.  The right common femoral, profunda femoral, and superficial femoral arteries are patent. Right popliteal artery is patent across the knee. Below-knee popliteal artery remains patent with small caliber but three-vessel runoff preserved to the right lower extremity.  Right femoral AV loop graft containing several stents remains patent. Arterial anastomosis to the proximal superficial femoral artery is patent. Loop graft remains patent with some intraluminal irregularity. Stented venous anastomosis remains patent to the  femoral vein. There is proximal thigh subcutaneous edema and some minor skin thickening of the right medial thigh adjacent to the venous limb of the graft but no perigraft fluid collection or soft tissue abscess appreciated. More inferiorly, there is strandy subcutaneous edema throughout the knee and lower extremity. Small knee effusion noted.  Included views of the right lower quadrant demonstrate a right lower abdominal wall ventral hernia containing only fat. Diverticulosis noted of the visualized the colon. Patient also has a left femoral central line in place.  Review of the MIP images confirms the above findings.  IMPRESSION: Patent right femoral AV loop graft containing several intragraft stents without evidence of thrombosis or occlusion. Proximal right thigh diffuse strandy edema and  skin thickening medially adjacent to the venous limb of the graft compatible with cellulitis.  No perigraft or proximal thigh significant fluid collection or abscess.  Patent right lower extremity peripheral vasculature.   Electronically Signed   By: Jerilynn Mages.  Shick M.D.   On: 12/19/2014 10:17    Scheduled Meds: . sodium chloride   Intravenous Once  . antiseptic oral rinse  7 mL Mouth Rinse BID  . budesonide-formoterol  2 puff Inhalation BID  . calcium acetate  1,334 mg Oral TID WC  . Chlorhexidine Gluconate Cloth  6 each Topical Q0600  . heparin  5,000 Units Subcutaneous 3 times per day  . hydrocortisone sod succinate (SOLU-CORTEF) inj  50 mg Intravenous Q8H  . lamoTRIgine  50 mg Oral QHS  . levETIRAcetam  1,000 mg Oral Once per day on Sun Mon Wed Fri  . levETIRAcetam  750 mg Oral Once per day on Tue Thu Sat  . levothyroxine  50 mcg Oral QAC breakfast  . midodrine  20 mg Oral TID WC  . mupirocin ointment  1 application Nasal BID  . pantoprazole  40 mg Oral BID  . piperacillin-tazobactam (ZOSYN)  IV  2.25 g Intravenous 3 times per day  . sertraline  100 mg Oral Daily  . sevelamer carbonate  1,600 mg Oral TID WC   . sodium chloride  10-40 mL Intracatheter Q12H  . topiramate  200 mg Oral BID  . vancomycin  1,000 mg Intravenous Q T,Th,Sa-HD   Continuous Infusions:  Antibiotics Given (last 72 hours)    Date/Time Action Medication Dose Rate   12/18/14 1633 Given   piperacillin-tazobactam (ZOSYN) IVPB 2.25 g 2.25 g 100 mL/hr   12/18/14 2003 Given   vancomycin (VANCOCIN) IVPB 1000 mg/200 mL premix 1,000 mg 200 mL/hr   12/18/14 2324 Given   piperacillin-tazobactam (ZOSYN) IVPB 2.25 g 2.25 g 100 mL/hr   12/19/14 0801 Given   piperacillin-tazobactam (ZOSYN) IVPB 2.25 g 2.25 g 100 mL/hr   12/19/14 2302 Given   piperacillin-tazobactam (ZOSYN) IVPB 2.25 g 2.25 g 100 mL/hr   12/20/14 0749 Given   piperacillin-tazobactam (ZOSYN) IVPB 2.25 g 2.25 g 100 mL/hr      Active Problems:   Hypotension   Severe sepsis with septic shock   Cellulitis of lower extremity    Time spent: 49min    [facility] Pager [phone]. If 7PM-7AM, please contact night-coverage at [email or web address], password [facility] 12/20/2014, 9:45 AM  LOS: 3 days

## 2014-12-20 NOTE — Procedures (Signed)
I was present at this session.  I have reviewed the session itself and made appropriate changes.  HD via thigh AVG. Rising BP, low 100s now.  Admiral Marcucci L 6/6/201612:24 PM

## 2014-12-20 NOTE — Progress Notes (Signed)
ANTIBIOTIC CONSULT NOTE  Pharmacy Consult for vancomycin + zosyn Indication: cellulitis  Allergies  Allergen Reactions  . Iodinated Diagnostic Agents Hives  . Iodine     Other reaction(s): Other (See Comments) Other Reaction: contrast-itching and flushing  . Methylprednisolone Hives  . Other Itching    Other reaction(s): Other (See Comments) Uncoded Allergy. Allergen: iv contrast dye, Other Reaction: flushing Arthritis medications Other reaction(s): Other (See Comments) Uncoded Allergy. Allergen: iv contrast dye, Other Reaction: flushing Arthritis medications  . Pollen Extract Hives  . Trimethadione     Other reaction(s): Other (See Comments) Other Reaction: Other reaction  . Trazodone And Nefazodone Hives and Rash    Patient Measurements: Height: 5\' 7"  (170.2 cm) Weight: (!) 326 lb 8 oz (148.1 kg) IBW/kg (Calculated) : 61.6  Vital Signs: Temp: 97.7 F (36.5 C) (06/06 0854) Temp Source: Axillary (06/06 0854) BP: 90/48 mmHg (06/06 0854) Pulse Rate: 59 (06/06 0854) Intake/Output from previous day: 06/05 0701 - 06/06 0700 In: 1340 [P.O.:1340] Out: 1 [Stool:1] Intake/Output from this shift: Total I/O In: 240 [P.O.:240] Out: 0   Labs:  Recent Labs  12/18/14 0505 12/20/14 0910  WBC 6.7 5.6  HGB 7.0* 8.7*  PLT 127* 185  CREATININE 6.59* 6.43*   Estimated Creatinine Clearance: 14.8 mL/min (by C-G formula based on Cr of 6.43).  Recent Labs  12/18/14 1700  VANCORANDOM 12     Microbiology: Recent Results (from the past 720 hour(s))  Culture, blood (routine x 2)     Status: None (Preliminary result)   Collection Time: 12/16/14  7:08 PM  Result Value Ref Range Status   Specimen Description BLOOD RIGHT HAND  Final   Special Requests BOTTLES DRAWN AEROBIC ONLY 3CC  Final   Culture   Final           BLOOD CULTURE RECEIVED NO GROWTH TO DATE CULTURE WILL BE HELD FOR 5 DAYS BEFORE ISSUING A FINAL NEGATIVE REPORT Performed at [facility]    Report  Status PENDING  Incomplete  Culture, blood (routine x 2)     Status: None (Preliminary result)   Collection Time: 12/16/14  9:36 PM  Result Value Ref Range Status   Specimen Description BLOOD RIGHT ARM  Final   Special Requests BOTTLES DRAWN AEROBIC ONLY 8CC  Final   Culture   Final           BLOOD CULTURE RECEIVED NO GROWTH TO DATE CULTURE WILL BE HELD FOR 5 DAYS BEFORE ISSUING A FINAL NEGATIVE REPORT Performed at [facility]    Report Status PENDING  Incomplete  MRSA PCR Screening     Status: Abnormal   Collection Time: 12/17/14  2:20 AM  Result Value Ref Range Status   MRSA by PCR POSITIVE (A) NEGATIVE Final    Comment:        The GeneXpert MRSA Assay (FDA approved for NASAL specimens only), is one component of a comprehensive MRSA colonization surveillance program. It is not intended to diagnose MRSA infection nor to guide or monitor treatment for MRSA infections. RESULT CALLED TO, READ BACK BY AND VERIFIED WITH: MKUFFOUR,M RN [id number] AT 0507 SKEEN,P      Assessment: 59 yo f admitted on 6/2 for what looked like a sepsis picture.  Vancomycin and zosyn were initiated empirically but was discontinued by CCM after 1 dose.  Pharmacy is re-consulted to dose vancomycin and zosyn for cellulitis. Patient is ESRD on HD (PTA schedule appears to be TTS per renal note). Also per  renal note, patient possibly received a dose of vancomycin and ceftazidime in HD on 6/2 prior to admission and also a vancomycin 2,500 mg load in the ED on 6/2. However it appears this amount may have been necessary as her post-HD level on 6/4 was only 12 which indicated she required a post-HD dose 6/4. Today is  day#3/13 (MD note says rx for 10 more days) V/Z for sepsis;  For extra HD today for 3 hours, usu TTS schedule.   6/4 Vanc random = 12  Zosyn 6/4 >> Vanc 6/4 >> 6/6 BCx2 from HD center: GPCocci in chains cellulitis of graft vs infected graft; can rx as outpt 6/2 BCx2:ngtd  Goal of Therapy:   Pre-HD vancomycin level 15-25 mcg/mL  Plan:  -extra vancomycin 750 mg IV after extra 3 hr HD session today -continue vanc 1 gm after HD on TTS -continue zosyn 2.25 gm IV q8h, could transition to ceftaz 2 gm after HD at discharge  Eudelia Bunch, Pharm.D. [phone] 12/20/2014 10:49 AM

## 2014-12-20 NOTE — Progress Notes (Addendum)
BP 84/36. Asymptomatic.  Notified Tylene Fantasia NP.  Orders received to manually recheck BP and notify if systolic BP less than 90.  Rechecked BP, was 84/36.  Notified Baltazar Najjar NP again. Ordered 250 cc bolus of NS at 250 cc/hr. Will continue to monitor.

## 2014-12-20 NOTE — Progress Notes (Signed)
Chaplain respond to nurse request.  Explored anxiety: Helped [name] gain insight on the origins and effective of her anxiety.  [name] anxiety comes from knowing she has to undergo dialysis treatments. The treatments wear her out. So, she shared with me that daily she enjoys reading a scripture, because it gives her peace.    I asked if she could meditate on her daily scriptures during her treatments. She told me that she would try this today.   12/20/14 1700  Clinical Encounter Type  Visited With Patient;Health care provider  Visit Type Initial;Spiritual support  Referral From Nurse  Consult/Referral To [facility] will follow-up.

## 2014-12-20 NOTE — Progress Notes (Signed)
[facility] Progress Note  Assessment/Plan: 1. Hypotension/fever - BC x 2 called from her HD center 6/6 gram + cocci in chains -  cellulitis of graft  Vs infected graft seems to be improving . Continues on empiric Vanc and Tressie Ellis (started prior to admission so may suppress BC growth in hospital cultures Can tx as outpatient 2. ESRD - TTS - last HD Sat kept even - Volume up today- had about 24+ oz on tray that she drank - needs strict fluid restriction!!!!  She is starting to ? continuing 3. Anemia - Hgb 7 6/4; Mircera 225 given 5/31 - not due for redose of ESA- has not been transfused - repeat CBC pre HD and transfuse prn - could have a dilutional component Will give ESA in am 4. Secondary hyperparathyroidism - P 5.3 6/4 Ca ok  5. BP/volume - chronic low BP even into 70s at outpt HD - can tolerate large volume removal when not ill; hard to know accuracy of weights 6. Nutrition - Alb 3 6/3 7. 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 Sz disorder  Myriam Jacobson, PA-C [facility] [phone] 12/20/2014,8:29 AM I have seen and examined this patient and agree with the plan of care seen,examined, eval, counseled patient about cont and outlook.  .  Anibal Quinby L 12/20/2014, 10:34 AM   LOS: 3 days   Subjective:   Tired of going to dialysis - soon to be on 9 years - agrees to go for extra tmt today but only 3 hours " that is all they do at her outpt HD center when she has an extra tmt"  Objective Filed Vitals:   12/19/14 0900 12/19/14 1822 12/19/14 1823 12/19/14 2023  BP: 99/48 85/40 92/49  101/43  Pulse: 72 62  60  Temp: 98.6 F (37 C) 97.8 F (36.6 C)  97.9 F (36.6 C)  TempSrc: Oral Oral  Oral  Resp: 18 18  20   Height:      Weight:    148.1 kg (326 lb 8 oz)  SpO2: 100% 100% 100% 100%   Physical Exam General: morbidly obese sitting in recliner keratoma on face Heart: distant  RRR Gr 3/6 M Lungs: distant due to body habitus, hard to know if crackles Abdomen: obese midline upper dressing not removed Extremities: obese, generalized edema Dialysis Access: right thigh graft + bruit, distant - due to size and poor quality stethescope, erythema improving (hard to examine while in recliner due to body habitus - evaluate later when on HD  Dialysis Orders:  [facility] 4h 60min F200 141kg 2/2 bath Hep 4000 Calcitriol 0.75 tiw, mircera 225 last 5/31 Rec'd IV vanc and fortaz PTA x 1 for redness around R thigh AVG  Additional Objective Labs: Basic Metabolic Panel:  Recent Labs Lab 12/16/14 1859 12/17/14 0230 12/18/14 0505  NA 139 137 135  K 5.1 4.6 5.1  CL 97* 98* 97*  CO2 27 28 26   GLUCOSE 101* 93 108*  BUN 28* 32* 51*  CREATININE 4.61* 4.91* 6.59*  CALCIUM 8.8* 9.0 8.8*  PHOS  --  4.3 5.3*   Liver Function Tests:  Recent Labs Lab 12/16/14 1859 12/17/14 0230  AST 24 22  ALT 13* 13*  ALKPHOS 206* 208*  BILITOT 0.7 0.6  PROT 6.5 6.6  ALBUMIN 3.1* 3.0*    Recent Labs Lab 12/16/14 1859  LIPASE 29   CBC:  Recent Labs Lab 12/16/14 1859 12/17/14  0230 12/18/14 0505  WBC 10.9* 9.1 6.7  NEUTROABS 9.5*  --   --   HGB 10.0* 7.7* 7.0*  HCT 33.0* 25.3* 22.2*  MCV 107.5* 107.2* 104.2*  PLT 124* 139* 127*   Blood Culture    Component Value Date/Time   SDES BLOOD RIGHT ARM 12/16/2014 2136   SPECREQUEST BOTTLES DRAWN AEROBIC ONLY 8CC 12/16/2014 2136   CULT  12/16/2014 2136           BLOOD CULTURE RECEIVED NO GROWTH TO DATE CULTURE WILL BE HELD FOR 5 DAYS BEFORE ISSUING A FINAL NEGATIVE REPORT Performed at [facility] PENDING 12/16/2014 2136    Cardiac Enzymes:  Recent Labs Lab 12/17/14 0230 12/17/14 0641  TROPONINI 0.10* 0.09*  Studies/Results: Ct Angio Low Extrem Right W/cm &/or Wo/cm  12/19/2014   CLINICAL DATA:  End-stage renal disease, right femoral thigh AV graft, cellulitis over the venous limb of the  graft.  EXAM: CT ANGIOGRAPHY OF THE RIGHT LOWEREXTREMITY  TECHNIQUE: Multidetector CT imaging of the RIGHT LOWER EXTREMITYwas performed using the standard protocol during bolus administration of intravenous contrast. Multiplanar CT image reconstructions and MIPs were obtained to evaluate the vascular anatomy.  CONTRAST:  159mL OMNIPAQUE IOHEXOL 350 MG/ML SOLN  COMPARISON:  09/06/2014  FINDINGS: CTA RIGHT LOWER EXTREMITY: Beginning in the pelvis, the right external iliac artery and vein remain patent.  The right common femoral, profunda femoral, and superficial femoral arteries are patent. Right popliteal artery is patent across the knee. Below-knee popliteal artery remains patent with small caliber but three-vessel runoff preserved to the right lower extremity.  Right femoral AV loop graft containing several stents remains patent. Arterial anastomosis to the proximal superficial femoral artery is patent. Loop graft remains patent with some intraluminal irregularity. Stented venous anastomosis remains patent to the femoral vein. There is proximal thigh subcutaneous edema and some minor skin thickening of the right medial thigh adjacent to the venous limb of the graft but no perigraft fluid collection or soft tissue abscess appreciated. More inferiorly, there is strandy subcutaneous edema throughout the knee and lower extremity. Small knee effusion noted.  Included views of the right lower quadrant demonstrate a right lower abdominal wall ventral hernia containing only fat. Diverticulosis noted of the visualized the colon. Patient also has a left femoral central line in place.  Review of the MIP images confirms the above findings.  IMPRESSION: Patent right femoral AV loop graft containing several intragraft stents without evidence of thrombosis or occlusion. Proximal right thigh diffuse strandy edema and skin thickening medially adjacent to the venous limb of the graft compatible with cellulitis.  No perigraft or  proximal thigh significant fluid collection or abscess.  Patent right lower extremity peripheral vasculature.   Electronically Signed   By: Jerilynn Mages.  Shick M.D.   On: 12/19/2014 10:17   Medications:   . antiseptic oral rinse  7 mL Mouth Rinse BID  . budesonide-formoterol  2 puff Inhalation BID  . calcium acetate  1,334 mg Oral TID WC  . Chlorhexidine Gluconate Cloth  6 each Topical Q0600  . heparin  5,000 Units Subcutaneous 3 times per day  . hydrocortisone sod succinate (SOLU-CORTEF) inj  50 mg Intravenous Q8H  . lamoTRIgine  50 mg Oral QHS  . levETIRAcetam  1,000 mg Oral Once per day on Sun Mon Wed Fri  . levETIRAcetam  750 mg Oral Once per day on Tue Thu Sat  . levothyroxine  50 mcg Oral QAC breakfast  . midodrine  20 mg Oral TID WC  . mupirocin ointment  1 application Nasal BID  . pantoprazole  40 mg Oral BID  . piperacillin-tazobactam (ZOSYN)  IV  2.25 g Intravenous 3 times per day  . sertraline  100 mg Oral Daily  . sevelamer carbonate  1,600 mg Oral TID WC  . sodium chloride  10-40 mL Intracatheter Q12H  . topiramate  200 mg Oral BID  . vancomycin  1,000 mg Intravenous Q T,Th,Sa-HD

## 2014-12-20 NOTE — Progress Notes (Signed)
Placed patient on CPAP for the night via auto-mode with minimum pressure set at 5cm and maximum pressure ser at 20cm. Oxygen set at 2lpm

## 2014-12-21 ENCOUNTER — Inpatient Hospital Stay (HOSPITAL_COMMUNITY): Payer: Medicare Other

## 2014-12-21 ENCOUNTER — Ambulatory Visit (HOSPITAL_COMMUNITY): Payer: Medicare Other

## 2014-12-21 DIAGNOSIS — R7881 Bacteremia: Secondary | ICD-10-CM

## 2014-12-21 LAB — BASIC METABOLIC PANEL
Anion gap: 13 (ref 5–15)
BUN: 34 mg/dL — AB (ref 6–20)
CALCIUM: 8.8 mg/dL — AB (ref 8.9–10.3)
CO2: 27 mmol/L (ref 22–32)
Chloride: 96 mmol/L — ABNORMAL LOW (ref 101–111)
Creatinine, Ser: 5.31 mg/dL — ABNORMAL HIGH (ref 0.44–1.00)
GFR calc non Af Amer: 8 mL/min — ABNORMAL LOW (ref >60)
GFR, EST AFRICAN AMERICAN: 10 mL/min — AB (ref >60)
Glucose, Bld: 99 mg/dL (ref 65–99)
POTASSIUM: 4.5 mmol/L (ref 3.5–5.1)
Sodium: 136 mmol/L (ref 135–145)

## 2014-12-21 LAB — CBC
HEMATOCRIT: 26.4 % — AB (ref 36.0–46.0)
Hemoglobin: 8.3 g/dL — ABNORMAL LOW (ref 12.0–15.0)
MCH: 33.3 pg (ref 26.0–34.0)
MCHC: 31.4 g/dL (ref 30.0–36.0)
MCV: 106 fL — AB (ref 78.0–100.0)
Platelets: 197 10*3/uL (ref 150–400)
RBC: 2.49 MIL/uL — ABNORMAL LOW (ref 3.87–5.11)
RDW: 18.3 % — AB (ref 11.5–15.5)
WBC: 4.9 10*3/uL (ref 4.0–10.5)

## 2014-12-21 MED ORDER — CEFAZOLIN SODIUM-DEXTROSE 2-3 GM-% IV SOLR: 2.0000 g | INTRAVENOUS | Status: DC

## 2014-12-21 MED ORDER — MENTHOL 3 MG MT LOZG
1.0000 | LOZENGE | OROMUCOSAL | Status: DC | PRN
  Administered 2014-12-21: 3 mg via ORAL
  Filled 2014-12-21: qty 9

## 2014-12-21 MED ORDER — CEFAZOLIN SODIUM-DEXTROSE 2-3 GM-% IV SOLR
2.0000 g | INTRAVENOUS | Status: DC
  Administered 2014-12-21: 2 g via INTRAVENOUS
  Filled 2014-12-21: qty 50

## 2014-12-21 MED ORDER — TRAMADOL HCL 50 MG PO TABS
50.0000 mg | ORAL_TABLET | Freq: Once | ORAL | Status: AC
  Administered 2014-12-21: 50 mg via ORAL
  Filled 2014-12-21: qty 1

## 2014-12-21 MED ORDER — CEFAZOLIN SODIUM-DEXTROSE 2-3 GM-% IV SOLR
2.0000 g | INTRAVENOUS | Status: DC
  Administered 2014-12-23: 2 g via INTRAVENOUS
  Filled 2014-12-21: qty 50

## 2014-12-21 MED ORDER — DARBEPOETIN ALFA 200 MCG/0.4ML IJ SOSY
PREFILLED_SYRINGE | INTRAMUSCULAR | Status: AC
  Filled 2014-12-21: qty 0.4

## 2014-12-21 MED ORDER — DEXTROSE 5 % IV SOLN: 3.0000 g | INTRAVENOUS | Status: DC

## 2014-12-21 MED ORDER — MIDODRINE HCL 5 MG PO TABS
ORAL_TABLET | ORAL | Status: AC
  Filled 2014-12-21: qty 4

## 2014-12-21 NOTE — Procedures (Signed)
I was present at this session.  I have reviewed the session itself and made appropriate changes. HD via R groin AVG.  bp 90s. ,vol xs.  tol low bps  Bodie Abernethy L 6/7/20169:41 AM

## 2014-12-21 NOTE — Progress Notes (Signed)
RT placed patient on CPAP for the night. Pt tolerating well at this time.

## 2014-12-21 NOTE — Progress Notes (Signed)
ANTIBIOTIC CONSULT NOTE - FOLLOW UP  Pharmacy Consult for Cefazolin Indication: Group A Strep Bacteremia  Allergies  Allergen Reactions  . Iodinated Diagnostic Agents Hives  . Iodine     Other reaction(s): Other (See Comments) Other Reaction: contrast-itching and flushing  . Methylprednisolone Hives  . Other Itching    Other reaction(s): Other (See Comments) Uncoded Allergy. Allergen: iv contrast dye, Other Reaction: flushing Arthritis medications Other reaction(s): Other (See Comments) Uncoded Allergy. Allergen: iv contrast dye, Other Reaction: flushing Arthritis medications  . Pollen Extract Hives  . Trimethadione     Other reaction(s): Other (See Comments) Other Reaction: Other reaction  . Trazodone And Nefazodone Hives and Rash    Patient Measurements: Height: 5\' 7"  (170.2 cm) Weight: (!) 324 lb 15.3 oz (147.4 kg) IBW/kg (Calculated) : 61.6  Vital Signs: Temp: 97.6 F (36.4 C) (06/07 1400) Temp Source: Oral (06/07 1400) BP: 103/52 mmHg (06/07 1400) Pulse Rate: 60 (06/07 1400) Intake/Output from previous day: 06/06 0701 - 06/07 0700 In: 1120 [P.O.:840; I.V.:30; IV Piggyback:250] Out: 5014  Intake/Output from this shift: Total I/O In: 300 [P.O.:300] Out: 1600 [Other:1600]  Labs:  Recent Labs  12/20/14 0910 12/21/14 0500  WBC 5.6 4.9  HGB 8.7* 8.3*  PLT 185 197  CREATININE 6.43* 5.31*   Estimated Creatinine Clearance: 17.9 mL/min (by C-G formula based on Cr of 5.31).  Recent Labs  12/18/14 1700  VANCORANDOM 12     Microbiology: Recent Results (from the past 720 hour(s))  Culture, blood (routine x 2)     Status: None (Preliminary result)   Collection Time: 12/16/14  7:08 PM  Result Value Ref Range Status   Specimen Description BLOOD RIGHT HAND  Final   Special Requests BOTTLES DRAWN AEROBIC ONLY 3CC  Final   Culture   Final           BLOOD CULTURE RECEIVED NO GROWTH TO DATE CULTURE WILL BE HELD FOR 5 DAYS BEFORE ISSUING A FINAL NEGATIVE  REPORT Performed at [facility]    Report Status PENDING  Incomplete  Culture, blood (routine x 2)     Status: None (Preliminary result)   Collection Time: 12/16/14  9:36 PM  Result Value Ref Range Status   Specimen Description BLOOD RIGHT ARM  Final   Special Requests BOTTLES DRAWN AEROBIC ONLY 8CC  Final   Culture   Final           BLOOD CULTURE RECEIVED NO GROWTH TO DATE CULTURE WILL BE HELD FOR 5 DAYS BEFORE ISSUING A FINAL NEGATIVE REPORT Performed at [facility]    Report Status PENDING  Incomplete  MRSA PCR Screening     Status: Abnormal   Collection Time: 12/17/14  2:20 AM  Result Value Ref Range Status   MRSA by PCR POSITIVE (A) NEGATIVE Final    Comment:        The GeneXpert MRSA Assay (FDA approved for NASAL specimens only), is one component of a comprehensive MRSA colonization surveillance program. It is not intended to diagnose MRSA infection nor to guide or monitor treatment for MRSA infections. RESULT CALLED TO, READ BACK BY AND VERIFIED WITH: MKUFFOUR,M RN [phone] AT 0507 SKEEN,P     Anti-infectives    Start     Dose/Rate Route Frequency Ordered Stop   12/20/14 1800  vancomycin (VANCOCIN) IVPB 750 mg/150 ml premix     750 mg 150 mL/hr over 60 Minutes Intravenous  Once 12/20/14 1053 12/20/14 1839   12/18/14 2000  vancomycin (  VANCOCIN) IVPB 1000 mg/200 mL premix  Status:  Discontinued     1,000 mg 200 mL/hr over 60 Minutes Intravenous Every T-Th-Sa (Hemodialysis) 12/18/14 1858 12/21/14 1609   12/18/14 1400  piperacillin-tazobactam (ZOSYN) IVPB 2.25 g  Status:  Discontinued     2.25 g 100 mL/hr over 30 Minutes Intravenous 3 times per day 12/18/14 1111 12/21/14 1609   12/18/14 1030  piperacillin-tazobactam (ZOSYN) IVPB 3.375 g  Status:  Discontinued     3.375 g 100 mL/hr over 30 Minutes Intravenous  Once 12/18/14 1024 12/18/14 1051   12/18/14 1030  vancomycin (VANCOCIN) IVPB 1000 mg/200 mL premix  Status:  Discontinued     1,000 mg 200  mL/hr over 60 Minutes Intravenous  Once 12/18/14 1024 12/18/14 1058   12/17/14 0400  piperacillin-tazobactam (ZOSYN) IVPB 2.25 g  Status:  Discontinued     2.25 g 100 mL/hr over 30 Minutes Intravenous Every 8 hours 12/16/14 1923 12/17/14 0056   12/16/14 1915  piperacillin-tazobactam (ZOSYN) IVPB 3.375 g     3.375 g 100 mL/hr over 30 Minutes Intravenous  Once 12/16/14 1913 12/16/14 2017   12/16/14 1915  vancomycin (VANCOCIN) 2,500 mg in sodium chloride 0.9 % 500 mL IVPB     2,500 mg 250 mL/hr over 120 Minutes Intravenous STAT 12/16/14 1913 12/16/14 2214      Assessment: 58 YOF on ESRD-TTS who presented on 6/2 with hypotension. The patient is noted to have R-thigh cellulitis and currently dialyzing via R-AVG. Outpatient blood cultures at the HD center grew 2/2 Group A Strep (strep pyogenes) - likely seeded from cellulitis or infected AVG. Pharmacy has been consulted to transition to Cefazolin.   The patient normally dialyzes on T/Th/Sat however was noted to receive an extra HD session on Monday, 6/6. It appears as if the patient will be back on a normal HD schedule but will monitor closely for need to adjust antibiotic doses.   Goal of Therapy:  Proper antibiotics for infection/cultures adjusted for renal/hepatic function   Plan:  1. Cefazolin 2g IV post HD-Tues/Thurs and 3g IV post HD on Saturdays 2. Will continue to follow HD schedule/duration, culture results, LOT, and antibiotic de-escalation plans   Alycia Rossetti, PharmD, BCPS Clinical Pharmacist Pager: [phone] 12/21/2014 4:17 PM

## 2014-12-21 NOTE — Progress Notes (Signed)
Femoral central line dressing half-way fallen off.  Notified IV team to redress dressing.  Patient refused dressing change at this time.  IV team stated they will try again later.

## 2014-12-21 NOTE — Progress Notes (Signed)
Patient complaining of 10 out of 10 pain in thigh graft.  Aching pain.  States that pain usually comes after a dialysis session.  Notified Tylene Fantasia, NP twice.  Orders received for ultram one time dose.  Will administer and continue to monitor.

## 2014-12-21 NOTE — Progress Notes (Addendum)
[facility] PROGRESS NOTE  [name] WEX:[id number] DOB: [birth year] DOA: 12/16/2014 PCP: Alvester Morin, MD   PCCM Transfer to [facility] 6/5 58 /F with ESRD, AI on chronic steroids, chronic hypotension, morbid obesity brought to [facility] ED 6/2; admitted to ICU with septic shock requiring pressors. Started on broad spectrum Abx and IV steroids , improved, transferred to [facility] 6/5 Blood Cx at [place] with [facility] in chains  Assessment/Plan: 1. Shock-likely septic -required pressors initially and stress dose steroids -CT without clear evidence of graft infection, no abscessVVS signed off also has very limited access options -Day 4 of Vanc/Zosyn, Blood cx's negative so far, CT abd unremarkable -Positive blood CX from Dialysis center per Renal, [facility] in chains, await speciation -weaned off stress dose steroids -source likely AVG or cellulitis of R thigh close to AVF  2. 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 ESRD on HD with volume over load -per Renal, getting extra HD -access issues  4. Tuberous sclerosis/Seizure d/o -on keppra/topamax/lamictal  5. Anemia of chronic disease - hb gradually trended down, no overt bleeding per pt,  - hb 8.3 this am, monitor -epo and Iron with HD   6. CHronic abd wound/Wound dehiscence for 1year -wound RN following, no s/s of infection, mostly approximated  DVT proph: Hep SQ  Code Status: Full Code Family Communication: none at bedside Disposition Plan: inpatient   Consultants:  Renal   VVS  HPI/Subjective: Feels ok, upset about extra HD Objective: Filed Vitals:   12/21/14 0454  BP: 90/53  Pulse: 59  Temp: 98.7 F (37.1 C)  Resp: 12    Intake/Output Summary (Last 24 hours) at 12/21/14 0817 Last data filed at 12/21/14 0600  Gross per 24 hour  Intake   1120 ml  Output   5014 ml  Net  -3894  ml   Filed Weights   12/20/14 1125 12/20/14 1550 12/20/14 2121  Weight: 152.4 kg (335 lb 15.7 oz) 147.4 kg (324 lb 15.3 oz) 148.326 kg (327 lb)    Exam:   General:  AAOX3, no distress  Cardiovascular: S1S2/RRR  Respiratory: CTAB  Abdomen: soft, NT, BS present, abd wound approximated without signs of infection, dressing, minimal tenderness of R thigh resolved  Musculoskeletal: no edema c/c, L thigh HD cath  Data Reviewed: Basic Metabolic Panel:  Recent Labs Lab 12/16/14 1859 12/17/14 0230 12/18/14 0505 12/20/14 0910 12/21/14 0500  NA 139 137 135 133* 136  K 5.1 4.6 5.1 3.8 4.5  CL 97* 98* 97* 93* 96*  CO2 27 28 26 26 27   GLUCOSE 101* 93 108* 135* 99  BUN 28* 32* 51* 45* 34*  CREATININE 4.61* 4.91* 6.59* 6.43* 5.31*  CALCIUM 8.8* 9.0 8.8* 8.5* 8.8*  MG  --  2.1 2.0  --   --   PHOS  --  4.3 5.3*  --   --    Liver Function Tests:  Recent Labs Lab 12/16/14 1859 12/17/14 0230  AST 24 22  ALT 13* 13*  ALKPHOS 206* 208*  BILITOT 0.7 0.6  PROT 6.5 6.6  ALBUMIN 3.1* 3.0*    Recent Labs Lab 12/16/14 1859  LIPASE 29   No results for input(s): AMMONIA in the last 168 hours. CBC:  Recent Labs Lab 12/16/14 1859 12/17/14 0230 12/18/14 0505 12/20/14 0910 12/21/14 0500  WBC 10.9* 9.1 6.7 5.6 4.9  NEUTROABS 9.5*  --   --   --   --  HGB 10.0* 7.7* 7.0* 8.7* 8.3*  HCT 33.0* 25.3* 22.2* 28.7* 26.4*  MCV 107.5* 107.2* 104.2* 105.5* 106.0*  PLT 124* 139* 127* 185 197   Cardiac Enzymes:  Recent Labs Lab 12/17/14 0230 12/17/14 0641  TROPONINI 0.10* 0.09*   BNP (last 3 results) No results for input(s): BNP in the last 8760 hours.  ProBNP (last 3 results) No results for input(s): PROBNP in the last 8760 hours.  CBG: No results for input(s): GLUCAP in the last 168 hours.  Recent Results (from the past 240 hour(s))  Culture, blood (routine x 2)     Status: None (Preliminary result)   Collection Time: 12/16/14  7:08 PM  Result Value Ref Range Status    Specimen Description BLOOD RIGHT HAND  Final   Special Requests BOTTLES DRAWN AEROBIC ONLY 3CC  Final   Culture   Final           BLOOD CULTURE RECEIVED NO GROWTH TO DATE CULTURE WILL BE HELD FOR 5 DAYS BEFORE ISSUING A FINAL NEGATIVE REPORT Performed at [facility]    Report Status PENDING  Incomplete  Culture, blood (routine x 2)     Status: None (Preliminary result)   Collection Time: 12/16/14  9:36 PM  Result Value Ref Range Status   Specimen Description BLOOD RIGHT ARM  Final   Special Requests BOTTLES DRAWN AEROBIC ONLY 8CC  Final   Culture   Final           BLOOD CULTURE RECEIVED NO GROWTH TO DATE CULTURE WILL BE HELD FOR 5 DAYS BEFORE ISSUING A FINAL NEGATIVE REPORT Performed at [facility]    Report Status PENDING  Incomplete  MRSA PCR Screening     Status: Abnormal   Collection Time: 12/17/14  2:20 AM  Result Value Ref Range Status   MRSA by PCR POSITIVE (A) NEGATIVE Final    Comment:        The GeneXpert MRSA Assay (FDA approved for NASAL specimens only), is one component of a comprehensive MRSA colonization surveillance program. It is not intended to diagnose MRSA infection nor to guide or monitor treatment for MRSA infections. RESULT CALLED TO, READ BACK BY AND VERIFIED WITH: MKUFFOUR,M RN [id number] AT 0507 SKEEN,P      Studies: No results found.  Scheduled Meds: . sodium chloride   Intravenous Once  . antiseptic oral rinse  7 mL Mouth Rinse BID  . budesonide-formoterol  2 puff Inhalation BID  . calcium acetate  1,334 mg Oral TID WC  . darbepoetin (ARANESP) injection - DIALYSIS  200 mcg Intravenous Q Tue-HD  . heparin  5,000 Units Subcutaneous 3 times per day  . hydrocortisone  10 mg Oral QPC supper  . hydrocortisone  15 mg Oral Daily  . lamoTRIgine  50 mg Oral QHS  . levETIRAcetam  1,000 mg Oral Once per day on Sun Mon Wed Fri  . levETIRAcetam  750 mg Oral Once per day on Tue Thu Sat  . levothyroxine  50 mcg Oral QAC breakfast  .  midodrine  20 mg Oral TID WC  . multivitamin  1 tablet Oral QHS  . mupirocin ointment  1 application Nasal BID  . pantoprazole  40 mg Oral BID  . piperacillin-tazobactam (ZOSYN)  IV  2.25 g Intravenous 3 times per day  . sertraline  100 mg Oral Daily  . sevelamer carbonate  1,600 mg Oral TID WC  . sodium chloride  10-40 mL Intracatheter Q12H  . topiramate  200 mg Oral BID  . vancomycin  1,000 mg Intravenous Q T,Th,Sa-HD   Continuous Infusions:  Antibiotics Given (last 72 hours)    Date/Time Action Medication Dose Rate   12/18/14 1633 Given   piperacillin-tazobactam (ZOSYN) IVPB 2.25 g 2.25 g 100 mL/hr   12/18/14 2003 Given   vancomycin (VANCOCIN) IVPB 1000 mg/200 mL premix 1,000 mg 200 mL/hr   12/18/14 2324 Given   piperacillin-tazobactam (ZOSYN) IVPB 2.25 g 2.25 g 100 mL/hr   12/19/14 0801 Given   piperacillin-tazobactam (ZOSYN) IVPB 2.25 g 2.25 g 100 mL/hr   12/19/14 2302 Given   piperacillin-tazobactam (ZOSYN) IVPB 2.25 g 2.25 g 100 mL/hr   12/20/14 0749 Given   piperacillin-tazobactam (ZOSYN) IVPB 2.25 g 2.25 g 100 mL/hr   12/20/14 1733 Given   piperacillin-tazobactam (ZOSYN) IVPB 2.25 g 2.25 g 100 mL/hr   12/20/14 1739 Given   vancomycin (VANCOCIN) IVPB 750 mg/150 ml premix 750 mg 150 mL/hr   12/21/14 0006 Given   piperacillin-tazobactam (ZOSYN) IVPB 2.25 g 2.25 g 100 mL/hr      Active Problems:   Hypotension   Severe sepsis with septic shock   Cellulitis of lower extremity    Time spent: 75min    [facility] Pager [phone]. If 7PM-7AM, please contact night-coverage at [email or web address], password [facility] 12/21/2014, 8:17 AM  LOS: 4 days

## 2014-12-21 NOTE — Progress Notes (Signed)
[name] Progress Note  Assessment/Plan: 1. Hypotension/fever -secondary to strep pyogenes bactremia 6/2 BC x 2 from outpt HD (Beta hemolytic strep group A) pansensitive (results placed in shadow chart) also- prob cellulitis of right thigh -doesn't seem to be related to graft; should be able to change to Ancef and d/c [place] Narrower spectrum and lower MICs in general 2. ESRD - TTS - last HD K 4.5 - 2 K bath today - advised pt she could run 4 hours since she had an extra 3 on Monday 3. Anemia - Hgb 7 6/4 up to 8.7 6/6 and 8.3 today- not sure what explains the variability without transfusion; Mircera 225 given 5/31 - not due for redose of ESA until 6/14, but reordered for Aranesp 200 today- has not been transfused - repeat CBC pre HD and transfuse prn -  4. Secondary hyperparathyroidism - P 5.3 6/4 Ca ok  5. BP/volume - chronic low BP even into 70s at outpt HD - can tolerate large volume removal when not ill; hard to know accuracy of weights; Net UF Monday 5 L!- post weight 147.4 - goal today 7 L - keep BP > 85 systolic 6. Nutrition - Alb 3 6/3 7. 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  Depression an issue .  [name] L 12/21/2014, 4:37 PM   LOS: 4 days   Subjective:   Complaining about being called too soon to HD, wanted to eat breakfast first  Objective Filed Vitals:   12/20/14 2121 12/20/14 2248 12/21/14 0320 12/21/14 0454  BP: 84/36 84/34 98/50  90/53  Pulse: 58   59  Temp: 98.6 F (37 C)   98.7 F (37.1 C)  TempSrc: Oral   Oral  Resp: 12   12  Height:      Weight: 148.326 kg (327 lb)     SpO2: 99%   100%   Physical Exam Wt 149.1 standing pre HD General: morbid  obesity,  Heart: RRR distant Gr 2/6 holosys M Lungs: dim BS no overt rales Abdomen: obese (noted dressing over sacrum when standing) Liver down 5 cm Extremities: morbidly obese, hard to quantify edema; right thigh medial and lateral distal erythema No induation or tenderness  Dialysis Access: right thigh AVGG + bruit/thrill - no erythema Skin angiokeratoma of face  Dialysis Orders: [facility] T[facility] 4h 8min F200 141kg 2/2 bath Hep 4000 Calcitriol 0.75 tiw, mircera 225 last 5/31 Rec'd IV vanc and fortaz PTA x 1 for redness around R thigh AVG   Additional Objective Labs: Basic Metabolic Panel:  Recent Labs Lab 12/17/14 0230 12/18/14 0505 12/20/14 0910 12/21/14 0500  NA 137 135 133* 136  K 4.6 5.1 3.8 4.5  CL 98* 97* 93* 96*  CO2 28 26 26 27   GLUCOSE 93 108* 135* 99  BUN 32* 51* 45* 34*  CREATININE 4.91* 6.59* 6.43* 5.31*  CALCIUM 9.0 8.8* 8.5* 8.8*  PHOS 4.3 5.3*  --   --    Liver Function Tests:  Recent Labs Lab 12/16/14 1859 12/17/14 0230  AST 24 22  ALT 13* 13*  ALKPHOS 206* 208*  BILITOT 0.7 0.6  PROT 6.5 6.6  ALBUMIN 3.1* 3.0*    Recent Labs Lab 12/16/14 1859  LIPASE 29  CBC:  Recent Labs Lab 12/16/14 1859 12/17/14 0230 12/18/14 0505 12/20/14 0910 12/21/14 0500  WBC 10.9* 9.1 6.7 5.6 4.9  NEUTROABS 9.5*  --   --   --   --   HGB 10.0* 7.7* 7.0* 8.7* 8.3*  HCT 33.0* 25.3* 22.2* 28.7* 26.4*  MCV 107.5* 107.2* 104.2* 105.5* 106.0*  PLT 124* 139* 127* 185 197   Blood Culture    Component Value Date/Time   SDES BLOOD RIGHT ARM 12/16/2014 2136   SPECREQUEST BOTTLES DRAWN AEROBIC ONLY 8CC 12/16/2014 2136   CULT  12/16/2014 2136           BLOOD CULTURE RECEIVED NO GROWTH TO DATE CULTURE WILL BE HELD FOR 5 DAYS BEFORE ISSUING A FINAL NEGATIVE REPORT Performed at [place] PENDING 12/16/2014 2136    Cardiac Enzymes:  Recent Labs Lab 12/17/14 0230 12/17/14 0641  TROPONINI 0.10* 0.09*  Medications:   . sodium  chloride   Intravenous Once  . antiseptic oral rinse  7 mL Mouth Rinse BID  . budesonide-formoterol  2 puff Inhalation BID  . calcium acetate  1,334 mg Oral TID WC  . darbepoetin (ARANESP) injection - DIALYSIS  200 mcg Intravenous Q Tue-HD  . heparin  5,000 Units Subcutaneous 3 times per day  . hydrocortisone  10 mg Oral QPC supper  . hydrocortisone  15 mg Oral Daily  . lamoTRIgine  50 mg Oral QHS  . levETIRAcetam  1,000 mg Oral Once per day on Sun Mon Wed Fri  . levETIRAcetam  750 mg Oral Once per day on Tue Thu Sat  . levothyroxine  50 mcg Oral QAC breakfast  . midodrine  20 mg Oral TID WC  . multivitamin  1 tablet Oral QHS  . mupirocin ointment  1 application Nasal BID  . pantoprazole  40 mg Oral BID  . piperacillin-tazobactam (ZOSYN)  IV  2.25 g Intravenous 3 times per day  . sertraline  100 mg Oral Daily  . sevelamer carbonate  1,600 mg Oral TID WC  . sodium chloride  10-40 mL Intracatheter Q12H  . topiramate  200 mg Oral BID  . vancomycin  1,000 mg Intravenous Q T,Th,Sa-HD

## 2014-12-21 NOTE — Progress Notes (Signed)
  Echocardiogram 2D Echocardiogram has been performed.  [name] 12/21/2014, 3:42 PM

## 2014-12-21 NOTE — Progress Notes (Signed)
Patient refused to go to hemodialysis this morning.  Stated that she will not go until she has eaten breakfast this morning.  HD RN present and aware.  Stated he will come back during second rounds for patient.  Will page attending physician.  Also, Echo called and spoke with this RN.  Patient restated that she will not be transported anywhere until she has eaten.  Echo made aware and requested that RN call them when she is ready for transport.  On-coming RN made aware of situation.

## 2014-12-22 DIAGNOSIS — A400 Sepsis due to streptococcus, group A: Principal | ICD-10-CM

## 2014-12-22 DIAGNOSIS — B954 Other streptococcus as the cause of diseases classified elsewhere: Secondary | ICD-10-CM

## 2014-12-22 DIAGNOSIS — R6521 Severe sepsis with septic shock: Secondary | ICD-10-CM

## 2014-12-22 DIAGNOSIS — A419 Sepsis, unspecified organism: Secondary | ICD-10-CM

## 2014-12-22 DIAGNOSIS — R7881 Bacteremia: Secondary | ICD-10-CM

## 2014-12-22 DIAGNOSIS — L03115 Cellulitis of right lower limb: Secondary | ICD-10-CM

## 2014-12-22 DIAGNOSIS — N186 End stage renal disease: Secondary | ICD-10-CM

## 2014-12-22 DIAGNOSIS — I890 Lymphedema, not elsewhere classified: Secondary | ICD-10-CM

## 2014-12-22 MED ORDER — TRAMADOL HCL 50 MG PO TABS
100.0000 mg | ORAL_TABLET | Freq: Once | ORAL | Status: AC
  Administered 2014-12-22: 100 mg via ORAL
  Filled 2014-12-22: qty 2

## 2014-12-22 NOTE — Progress Notes (Addendum)
[facility] PROGRESS NOTE  JAYLON STAPEL FAO:[id number] DOB: [birth year] DOA: 12/16/2014 PCP: Keane Police, MD   PCCM Transfer to [facility] 6/5 58 /F with ESRD, AI on chronic steroids, chronic hypotension, morbid obesity brought to [facility] ED 6/2; admitted to ICU with septic shock requiring pressors. Started on broad spectrum Abx and IV steroids , improved, transferred to [facility] 6/5 Blood Cx at Dialysis Center with [facility] in chains  Assessment/Plan: 1. Shock-likely septic -required pressors initially and stress dose steroids -CT without clear evidence of graft infection, no abscessVVS signed off also has very limited access options -Day 4 of Vanc/Zosyn, Blood cx's negative so far, CT abd unremarkable -Positive blood CX from Dialysis center on 6/2 per Renal, [facility] in chains, strep pyogene per renal, abx changed to ancef given during HD, source of infection? Avg? (recent thrombectomy in 5, thigh cellulitis? Chronic ab wound?) ID consulted on 6/8 for assistance on abx choice and duration. -weaned off stress dose steroids   2. 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 ESRD on HD with volume over load -per Renal, getting extra HD -access issues  4. Tuberous sclerosis/Seizure d/o -on keppra/topamax/lamictal  5. Anemia of chronic disease - hb gradually trended down, no overt bleeding per pt,  - hb 8.3 this am, monitor -epo and Iron with HD   6. CHronic abd wound/Wound dehiscence for 1year -wound RN following, no s/s of infection, mostly approximated  DVT proph: Hep SQ  Code Status: Full Code Family Communication: none at bedside Disposition Plan: inpatient   Consultants:  Renal   VVS  HPI/Subjective: Sitting up in chair, poor historian, does not seem in acute distress  Objective: Filed Vitals:   12/22/14 0517  BP: 104/46  Pulse: 59  Temp: 97.8  F (36.6 C)  Resp: 15    Intake/Output Summary (Last 24 hours) at 12/22/14 1019 Last data filed at 12/22/14 0600  Gross per 24 hour  Intake    870 ml  Output   1600 ml  Net   -730 ml   Filed Weights   12/21/14 0928 12/21/14 1400 12/21/14 2104  Weight: 149 kg (328 lb 7.8 oz) 147.4 kg (324 lb 15.3 oz) 147.873 kg (326 lb)    Exam:   General:  AAOX3, no distress, obese  Cardiovascular: S1S2/RRR  Respiratory: CTAB  Abdomen: soft, NT, BS present, abd wound approximated without signs of infection, dressing, minimal tenderness of R thigh resolved  Musculoskeletal: no edema c/c, L thigh HD cath  Data Reviewed: Basic Metabolic Panel:  Recent Labs Lab 12/16/14 1859 12/17/14 0230 12/18/14 0505 12/20/14 0910 12/21/14 0500  NA 139 137 135 133* 136  K 5.1 4.6 5.1 3.8 4.5  CL 97* 98* 97* 93* 96*  CO2 27 28 26 26 27   GLUCOSE 101* 93 108* 135* 99  BUN 28* 32* 51* 45* 34*  CREATININE 4.61* 4.91* 6.59* 6.43* 5.31*  CALCIUM 8.8* 9.0 8.8* 8.5* 8.8*  MG  --  2.1 2.0  --   --   PHOS  --  4.3 5.3*  --   --    Liver Function Tests:  Recent Labs Lab 12/16/14 1859 12/17/14 0230  AST 24 22  ALT 13* 13*  ALKPHOS 206* 208*  BILITOT 0.7 0.6  PROT 6.5 6.6  ALBUMIN 3.1* 3.0*    Recent Labs Lab 12/16/14 1859  LIPASE 29   No results for  input(s): AMMONIA in the last 168 hours. CBC:  Recent Labs Lab 12/16/14 1859 12/17/14 0230 12/18/14 0505 12/20/14 0910 12/21/14 0500  WBC 10.9* 9.1 6.7 5.6 4.9  NEUTROABS 9.5*  --   --   --   --   HGB 10.0* 7.7* 7.0* 8.7* 8.3*  HCT 33.0* 25.3* 22.2* 28.7* 26.4*  MCV 107.5* 107.2* 104.2* 105.5* 106.0*  PLT 124* 139* 127* 185 197   Cardiac Enzymes:  Recent Labs Lab 12/17/14 0230 12/17/14 0641  TROPONINI 0.10* 0.09*   BNP (last 3 results) No results for input(s): BNP in the last 8760 hours.  ProBNP (last 3 results) No results for input(s): PROBNP in the last 8760 hours.  CBG: No results for input(s): GLUCAP in the last  168 hours.  Recent Results (from the past 240 hour(s))  Culture, blood (routine x 2)     Status: None (Preliminary result)   Collection Time: 12/16/14  7:08 PM  Result Value Ref Range Status   Specimen Description BLOOD RIGHT HAND  Final   Special Requests BOTTLES DRAWN AEROBIC ONLY 3CC  Final   Culture   Final           BLOOD CULTURE RECEIVED NO GROWTH TO DATE CULTURE WILL BE HELD FOR 5 DAYS BEFORE ISSUING A FINAL NEGATIVE REPORT Performed at [facility]    Report Status PENDING  Incomplete  Culture, blood (routine x 2)     Status: None (Preliminary result)   Collection Time: 12/16/14  9:36 PM  Result Value Ref Range Status   Specimen Description BLOOD RIGHT ARM  Final   Special Requests BOTTLES DRAWN AEROBIC ONLY 8CC  Final   Culture   Final           BLOOD CULTURE RECEIVED NO GROWTH TO DATE CULTURE WILL BE HELD FOR 5 DAYS BEFORE ISSUING A FINAL NEGATIVE REPORT Performed at [facility]    Report Status PENDING  Incomplete  MRSA PCR Screening     Status: Abnormal   Collection Time: 12/17/14  2:20 AM  Result Value Ref Range Status   MRSA by PCR POSITIVE (A) NEGATIVE Final    Comment:        The GeneXpert MRSA Assay (FDA approved for NASAL specimens only), is one component of a comprehensive MRSA colonization surveillance program. It is not intended to diagnose MRSA infection nor to guide or monitor treatment for MRSA infections. RESULT CALLED TO, READ BACK BY AND VERIFIED WITH: MKUFFOUR,M RN [id number] AT 0507 SKEEN,P      Studies: No results found.  Scheduled Meds: . sodium chloride   Intravenous Once  . antiseptic oral rinse  7 mL Mouth Rinse BID  . budesonide-formoterol  2 puff Inhalation BID  . calcium acetate  1,334 mg Oral TID WC  . [START ON 12/25/2014]  ceFAZolin (ANCEF) IV  3 g Intravenous Q Sat-1800  .  ceFAZolin (ANCEF) IV  2 g Intravenous Q Tue-1800  . [START ON 12/23/2014]  ceFAZolin (ANCEF) IV  2 g Intravenous Q Thu-1800  . darbepoetin  (ARANESP) injection - DIALYSIS  200 mcg Intravenous Q Tue-HD  . heparin  5,000 Units Subcutaneous 3 times per day  . hydrocortisone  10 mg Oral QPC supper  . hydrocortisone  15 mg Oral Daily  . lamoTRIgine  50 mg Oral QHS  . levETIRAcetam  1,000 mg Oral Once per day on Sun Mon Wed Fri  . levETIRAcetam  750 mg Oral Once per day on Tue Thu Sat  .  levothyroxine  50 mcg Oral QAC breakfast  . midodrine  20 mg Oral TID WC  . multivitamin  1 tablet Oral QHS  . pantoprazole  40 mg Oral BID  . sertraline  100 mg Oral Daily  . sevelamer carbonate  1,600 mg Oral TID WC  . sodium chloride  10-40 mL Intracatheter Q12H  . topiramate  200 mg Oral BID   Continuous Infusions:  Antibiotics Given (last 72 hours)    Date/Time Action Medication Dose Rate   12/19/14 2302 Given   piperacillin-tazobactam (ZOSYN) IVPB 2.25 g 2.25 g 100 mL/hr   12/20/14 0749 Given   piperacillin-tazobactam (ZOSYN) IVPB 2.25 g 2.25 g 100 mL/hr   12/20/14 1733 Given   piperacillin-tazobactam (ZOSYN) IVPB 2.25 g 2.25 g 100 mL/hr   12/20/14 1739 Given   vancomycin (VANCOCIN) IVPB 750 mg/150 ml premix 750 mg 150 mL/hr   12/21/14 0006 Given   piperacillin-tazobactam (ZOSYN) IVPB 2.25 g 2.25 g 100 mL/hr   12/21/14 0843 Given   piperacillin-tazobactam (ZOSYN) IVPB 2.25 g 2.25 g 100 mL/hr   12/21/14 1300 Given  [given for HD]   vancomycin (VANCOCIN) IVPB 1000 mg/200 mL premix 1,000 mg 200 mL/hr   12/21/14 1553 Given   piperacillin-tazobactam (ZOSYN) IVPB 2.25 g 2.25 g 100 mL/hr   12/21/14 1856 Given   ceFAZolin (ANCEF) IVPB 2 g/50 mL premix 2 g 100 mL/hr      Active Problems:   Hypotension   Severe sepsis with septic shock   Cellulitis of lower extremity    Time spent:    Coco Sharpnack MD PhD  [facility] Pager [phone]. If 7PM-7AM, please contact night-coverage at [email or web address], password [facility] 12/22/2014, 10:19 AM  LOS: 5 days

## 2014-12-22 NOTE — Consult Note (Signed)
[facility] Progress Note  Assessment/Plan: 1. Hypotension/fever -secondary to strep pyogenes bactremia 6/2 BC x 2 from outpt HD (Beta hemolytic strep group A) pansensitive (results placed in shadow chart) also- prob cellulitis of right thigh -doesn't seem to be related to graft; Antibiotics changed to Ancef 2 gm on T and Th with 3 gm on Sat. Can continue at outpt HD unit after d/c 2. ESRD - TTS - last HD K 4.5 - 2 K bath - HD Thursday - if not d/c today slowly get back to dry 3. Anemia - Hgb 7 6/4 up to 8.7 6/6 and 8.3 6/7- not sure what explains the variability without transfusion; Mircera 225 given 5/31 - Aranesp 200 given 6/7 4. Secondary hyperparathyroidism - P 5.3 6/4 Ca ok  5. BP/volume - chronic low BP even into 70s at outpt HD - can tolerate large volume removal when not ill; hard to know accuracy of weights; Net UF Monday 5 L w/ post weight 147.4 - NET UF on Tuesday only 1.6 with post weight of 147.4 due to low BPs - she had been in recent weeks been able to get close to edw 6. Nutrition - Alb 3 6/3 - eating well 7. 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 Disp - hard to know end point of treatment - probably ok to d/c and titrate volume down at outpt HD center; specifiy duration of Ancef after d/c  Myriam Jacobson, PA-C [place] [phone] 12/22/2014,8:54 AM  LOS: 5 days  I have seen and examined this patient and agree with the plan of care  Seen , eval, examined, patient counseled,will cont Ancef for strep.  Can do at [facility] .  Kashauna Celmer L 12/22/2014, 9:42 AM   Subjective:   Takes CPAP off at times at night as it bothers her nose. C/o right thigh graft pain where they stick it yesterday.    Objective Filed Vitals:   12/21/14 1707 12/21/14 2104 12/21/14 2214 12/22/14 0517  BP: 101/54 91/46  104/46  Pulse: 62 59  59  Temp: 98.6 F (37 C) 98 F (36.7 C)  97.8 F (36.6 C)   TempSrc: Oral Oral  Oral  Resp: 18 12  15   Height:      Weight:  147.873 kg (326 lb)    SpO2: 100% 100% 100% 100%   Physical Exam General: morbidly obese alert sitting in chair NAD - sats ok on room air Heart: RRR distant Gr 2/6 M Lungs: no overt rales , dim BS Abdomen: obese pos bs, wound upper abdm.  Liver down6 cm Extremities: LE morbidly obese with difficulty to quantify edema; erythema on distal thigh less than yesterday Improved Dialysis Access: right thigh AVGG + bruit, nontender on palpation no overt evident of infection.  Dialysis Orders: [place] TTS 4h 67min F200 141kg 2/2 bath Hep 4000 Calcitriol 0.75 tiw, mircera 225 last 5/31 Rec'd IV vanc and fortaz PTA x 1 for redness around R thigh AVG  Additional Objective Labs: Basic Metabolic Panel:  Recent Labs Lab 12/17/14 0230 12/18/14 0505 12/20/14 0910 12/21/14 0500  NA 137 135 133* 136  K 4.6 5.1 3.8 4.5  CL 98* 97* 93* 96*  CO2 28 26 26 27   GLUCOSE 93 108* 135* 99  BUN 32* 51* 45* 34*  CREATININE 4.91* 6.59* 6.43* 5.31*  CALCIUM 9.0 8.8* 8.5* 8.8*  PHOS 4.3 5.3*  --   --    Liver Function  Tests:  Recent Labs Lab 12/16/14 1859 12/17/14 0230  AST 24 22  ALT 13* 13*  ALKPHOS 206* 208*  BILITOT 0.7 0.6  PROT 6.5 6.6  ALBUMIN 3.1* 3.0*    Recent Labs Lab 12/16/14 1859  LIPASE 29   CBC:  Recent Labs Lab 12/16/14 1859 12/17/14 0230 12/18/14 0505 12/20/14 0910 12/21/14 0500  WBC 10.9* 9.1 6.7 5.6 4.9  NEUTROABS 9.5*  --   --   --   --   HGB 10.0* 7.7* 7.0* 8.7* 8.3*  HCT 33.0* 25.3* 22.2* 28.7* 26.4*  MCV 107.5* 107.2* 104.2* 105.5* 106.0*  PLT 124* 139* 127* 185 197   Blood Culture    Component Value Date/Time   SDES BLOOD RIGHT ARM 12/16/2014 2136   SPECREQUEST BOTTLES DRAWN AEROBIC ONLY 8CC 12/16/2014 2136   CULT  12/16/2014 2136           BLOOD CULTURE RECEIVED NO GROWTH TO DATE CULTURE WILL BE HELD FOR 5 DAYS BEFORE ISSUING A FINAL NEGATIVE REPORT Performed at [place] PENDING 12/16/2014 2136    Cardiac Enzymes:  Recent Labs Lab 12/17/14 0230 12/17/14 0641  TROPONINI 0.10* 0.09*  Medications:   . sodium chloride   Intravenous Once  . antiseptic oral rinse  7 mL Mouth Rinse BID  . budesonide-formoterol  2 puff Inhalation BID  . calcium acetate  1,334 mg Oral TID WC  . [START ON 12/25/2014]  ceFAZolin (ANCEF) IV  3 g Intravenous Q Sat-1800  .  ceFAZolin (ANCEF) IV  2 g Intravenous Q Tue-1800  . [START ON 12/23/2014]  ceFAZolin (ANCEF) IV  2 g Intravenous Q Thu-1800  . darbepoetin (ARANESP) injection - DIALYSIS  200 mcg Intravenous Q Tue-HD  . heparin  5,000 Units Subcutaneous 3 times per day  . hydrocortisone  10 mg Oral QPC supper  . hydrocortisone  15 mg Oral Daily  . lamoTRIgine  50 mg Oral QHS  . levETIRAcetam  1,000 mg Oral Once per day on Sun Mon Wed Fri  . levETIRAcetam  750 mg Oral Once per day on Tue Thu Sat  . levothyroxine  50 mcg Oral QAC breakfast  . midodrine  20 mg Oral TID WC  . multivitamin  1 tablet Oral QHS  . pantoprazole  40 mg Oral BID  . sertraline  100 mg Oral Daily  . sevelamer carbonate  1,600 mg Oral TID WC  . sodium chloride  10-40 mL Intracatheter Q12H  . topiramate  200 mg Oral BID

## 2014-12-22 NOTE — Clinical Social Work Note (Signed)
Clinical Social Work Assessment  Patient Details  Name: [name] MRN: [id number] Date of Birth: 1955-08-14  Date of referral:  12/20/14               Reason for consult:  Facility Placement                Permission sought to share information with:    Permission granted to share information::  Yes, Release of Information Signed, Yes, Verbal Permission Granted  Name::        Agency::  [facility]  Relationship::     Contact Information:     Housing/Transportation Living arrangements for the past 2 months:  [place] of Information:  Patient Patient Interpreter Needed:  None Criminal Activity/Legal Involvement Pertinent to Current Situation/Hospitalization:  No - Comment as needed Significant Relationships:    Lives with:  Parents, Siblings - Patient lives at [facility] skilled nursing facility in [place]. Patient reported that her family includes her parents [name] and [name] and she also mentioned a brother. Do you feel safe going back to the place where you live?  Yes Need for family participation in patient care:  No (Coment)  Care giving concerns:  Patient expressed no concerns regarding the care she is receiving at the skilled facility.   Social Worker assessment / plan:  CSW talked with patient regarding discharge plans to confirm return to facility. [name] was sitting up in the chair at bedside and is alert, oriented x 4 and was agreeable to talking with CSW.  Patient reports that she has been at [facility] since 2013, after having to leave [place] when they closed down. Patient indicated that she could not find a facility in [place] to take her, and still wants to get back in [place]. [name] added that the SW at her facility, [name] has been working on trying to locate a facility, but so far has not found anything.  [name] reported however that she does plan to return to [facility] when ready for discharge from the hospital. When asked about family, patient explained that her parents live in [place], [place] and she sees them occasionally. Her brother does not come to see her at all and this depresses her, per [name]. She reported that her family (other than her parents ) live in [place].  Employment status:  Disabled (Comment on whether or not currently receiving Disability) Insurance information:  Medicaid [place], [place] PT Recommendations:  Not assessed at this time Information / Referral to community resources:  Other (Comment Required) (None requested or needed at this time)  Patient/Family's Response to care: Patient talked with CSW regarding what brought her to the hospital and added that she prefers to come to [facility] because they take off the right amount of fluid in the dialysis center while she is in the hospital versus [facility].  Patient/Family's Understanding of and Emotional Response to Diagnosis, Current Treatment, and Prognosis:  Patient did not report any concerns with the care she is receiving.  Emotional Assessment Appearance:  Appears stated age Attitude/Demeanor/Rapport:  Other (Appropriate) Affect (typically observed):  Appropriate, Calm Orientation:  Oriented to Self, Oriented to Place, Oriented to  Time, Oriented to Situation Alcohol / Substance use:  Never Used (Patient reports that she has never smoked and does not drink or use illiciit drugs.) Psych involvement (Current and /or in the community):  No (Comment)  Discharge Needs  Concerns to be addressed:  No discharge needs identified Readmission within the last 30 days:  Yes Current discharge risk:  None Barriers to Discharge:  No Barriers Identified   Sable Feil, LCSW 12/22/2014, 2:07 PM

## 2014-12-22 NOTE — Procedures (Signed)
Pt is not ready to be placed on CPAP at this time. RT will continue to monitor the placed and placed when asked.

## 2014-12-22 NOTE — Consult Note (Signed)
[facility]  Total days of antibiotics 7        Day 2 cefazolin               Reason for Consult: strep pyogenes sepsis/bacteremia   Referring Physician: xu  Active Problems:   Hypotension   Severe sepsis with septic shock   Cellulitis of lower extremity    HPI: [name] is a 59 y.o. 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 She did tolerate hd on day of admit at dialysis center where blood cx were taken since she felt unwell. Dialysis cx did grow s. Pyogenes. She initially required fluid rescucitation, stress dose steroids and pressors. She was placed on broad spectrum antibiotics but then narrowed to cefazolin. Her physical exam did show mild erythema to right thigh thus CT A was done of right leg to evaluate right femoral AV graft. It did not appear red or have any difficulty with HD at that time. CT report suggestive of cellulitis possibly but did not suggest AV graft infection. Vascular surgery were not inclined to remove graft per report due to limited access and no clear evidence that graft was infected, but rather having proximal infection with cellulitis/soft tissue infection. Patient is poor historian but feels better overall. She has large ventral hernia, and abdominal incision that has chronic ulcer, slow to heal, that is not appear to be infected. ID consulted to weigh in on abtx, length of tx.  TTE did not see vegatations but patient has large body habitus  She has had prior hosp in April, and February 2016 for sepsis. In feb, klebsiella bacteremia   IMPRESSION: Patent right femoral AV loop graft containing several intragraft stents without evidence of thrombosis or occlusion. Proximal right thigh diffuse strandy edema and skin thickening medially adjacent to the venous limb of the graft compatible  with cellulitis.  No perigraft or proximal thigh significant fluid collection or abscess.  Patent right lower extremity peripheral vasculature.   Past Medical History  Diagnosis Date  . Renal insufficiency   . Mental retardation   . Sleep apnea   . End-stage renal disease on hemodialysis   . Tuberous sclerosis   . Obesity   . GERD (gastroesophageal reflux disease)   . Depression   . Mental retardation   . Sleep apnea     on c-pap  . Hx MRSA infection   . Obesity   . Complication of anesthesia     "sometimes I don't wake up on time; depends on how much med"  . Asthma   . 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 Osteoarthritis   . Angina   . CHF (congestive heart failure)   . Shortness of breath     "all the time"  . Pneumonia ? 2006  . Renal failure   . ESRD (end stage renal disease) on dialysis 05/25/11    Tu, Th, Sa  . Blood transfusion   . Anemia   . Stomach ulcer   . Constipated   . Seizures     last seizure 05/21/11  . Pacemaker     AV paced  . Hypertension     Allergies:  Allergies  Allergen Reactions  . Iodinated Diagnostic Agents Hives  . Iodine     Other reaction(s): Other (See Comments) Other Reaction: contrast-itching and flushing  . Methylprednisolone  Hives  . Other Itching    Other reaction(s): Other (See Comments) Uncoded Allergy. Allergen: iv contrast dye, Other Reaction: flushing Arthritis medications Other reaction(s): Other (See Comments) Uncoded Allergy. Allergen: iv contrast dye, Other Reaction: flushing Arthritis medications  . Pollen Extract Hives  . Trimethadione     Other reaction(s): Other (See Comments) Other Reaction: Other reaction  . Trazodone And Nefazodone Hives and Rash    MEDICATIONS: . sodium chloride   Intravenous Once  . antiseptic oral rinse  7 mL Mouth Rinse BID  . budesonide-formoterol  2 puff Inhalation BID  . calcium acetate  1,334 mg Oral TID WC  . [START ON 12/25/2014]  ceFAZolin (ANCEF) IV  3 g Intravenous Q Sat-1800  .   ceFAZolin (ANCEF) IV  2 g Intravenous Q Tue-1800  . [START ON 12/23/2014]  ceFAZolin (ANCEF) IV  2 g Intravenous Q Thu-1800  . darbepoetin (ARANESP) injection - DIALYSIS  200 mcg Intravenous Q Tue-HD  . heparin  5,000 Units Subcutaneous 3 times per day  . hydrocortisone  10 mg Oral QPC supper  . hydrocortisone  15 mg Oral Daily  . lamoTRIgine  50 mg Oral QHS  . levETIRAcetam  1,000 mg Oral Once per day on Sun Mon Wed Fri  . levETIRAcetam  750 mg Oral Once per day on Tue Thu Sat  . levothyroxine  50 mcg Oral QAC breakfast  . midodrine  20 mg Oral TID WC  . multivitamin  1 tablet Oral QHS  . pantoprazole  40 mg Oral BID  . sertraline  100 mg Oral Daily  . sevelamer carbonate  1,600 mg Oral TID WC  . sodium chloride  10-40 mL Intracatheter Q12H  . topiramate  200 mg Oral BID    History  Substance Use Topics  . Smoking status: Never Smoker   . Smokeless tobacco: Never Used  . Alcohol Use: No    Family History  Problem Relation Age of Onset  . Diabetes Father   . Asthma Son     Review of Systems -  10 point ros is negative, though i question her understanding all the questions due to cognitive impairment at baseline  OBJECTIVE: Temp:  [97.2 F (36.2 C)-98.6 F (37 C)] 97.2 F (36.2 C) (06/08 0945) Pulse Rate:  [59-62] 62 (06/08 0945) Resp:  [12-18] 16 (06/08 0945) BP: (91-113)/(46-55) 113/55 mmHg (06/08 0945) SpO2:  [100 %] 100 % (06/08 1008) Weight:  [324 lb 15.3 oz (147.4 kg)-326 lb (147.873 kg)] 326 lb (147.873 kg) (06/07 2104) Physical Exam  Constitutional:  oriented to person, place. appears well-developed and well-nourished. Obese female No distress.  HENT: [place]/AT, PERRLA, no scleral icterus Mouth/Throat: Oropharynx is clear and moist. No oropharyngeal exudate.  Cardiovascular: distant heart sounds, regular rhythm and normal heart sounds. No murmur heard.  Pulmonary/Chest: Effort normal and breath sounds normal. No respiratory distress.  has no wheezes.  Neck =  supple, no nuchal rigidity Abdominal: Soft. Bowel sounds are normal.  exhibits no distension. There is no tenderness.  Lymphadenopathy: no cervical adenopathy. No axillary adenopathy Neurological: alert and oriented to person, place, and time.  Skin: Skin is warm and dry. No rash noted. No erythema.  Ext = non pitting lymphedema bilaterally Psychiatric: slow to respond to questions. Not consistently answering questions   LABS: Results for orders placed or performed during the hospital encounter of 12/16/14 (from the past 48 hour(s))  CBC     Status: Abnormal   Collection Time: 12/21/14  5:00 AM  Result Value Ref Range   WBC 4.9 4.0 - 10.5 K/uL   RBC 2.49 (L) 3.87 - 5.11 MIL/uL   Hemoglobin 8.3 (L) 12.0 - 15.0 g/dL   HCT 26.4 (L) 36.0 - 46.0 %   MCV 106.0 (H) 78.0 - 100.0 fL   MCH 33.3 26.0 - 34.0 pg   MCHC 31.4 30.0 - 36.0 g/dL   RDW 18.3 (H) 11.5 - 15.5 %   Platelets 197 150 - 400 K/uL  Basic metabolic panel     Status: Abnormal   Collection Time: 12/21/14  5:00 AM  Result Value Ref Range   Sodium 136 135 - 145 mmol/L   Potassium 4.5 3.5 - 5.1 mmol/L   Chloride 96 (L) 101 - 111 mmol/L   CO2 27 22 - 32 mmol/L   Glucose, Bld 99 65 - 99 mg/dL   BUN 34 (H) 6 - 20 mg/dL   Creatinine, Ser 5.31 (H) 0.44 - 1.00 mg/dL   Calcium 8.8 (L) 8.9 - 10.3 mg/dL   GFR calc non Af Amer 8 (L) >60 mL/min   GFR calc Af Amer 10 (L) >60 mL/min    Comment: (NOTE) The eGFR has been calculated using the CKD EPI equation. This calculation has not been validated in all clinical situations. eGFR's persistently <60 mL/min signify possible Chronic Kidney Disease.    Anion gap 13 5 - 15    MICRO: 6/2 blood cx ngtd IMAGING: IMPRESSION: Patent right femoral AV loop graft containing several intragraft stents without evidence of thrombosis or occlusion. Proximal right thigh diffuse strandy edema and skin thickening medially adjacent to the venous limb of the graft compatible with cellulitis.  No  perigraft or proximal thigh significant fluid collection or abscess.  Patent right lower extremity peripheral vasculature.  Assessment/Plan:  59yo F with ESRD HD thru right femoval AVG who presents with sepsis due to strep pyogenes bacteremia. She cleared her bacteremia quickly. Concern for right leg cellulitis and less likely femoral AVG infection - treat for 6  wks with cefazolin for presumed AVG infection. Give after HD. Use 6/6 as day 1 of 42 days - will see if need to rifampin added to the regimen - will check hiv and hep c  Dijon Kohlman B. [facility] [phone]

## 2014-12-23 DIAGNOSIS — Z992 Dependence on renal dialysis: Secondary | ICD-10-CM

## 2014-12-23 LAB — RENAL FUNCTION PANEL
Albumin: 3 g/dL — ABNORMAL LOW (ref 3.5–5.0)
Anion gap: 12 (ref 5–15)
BUN: 24 mg/dL — ABNORMAL HIGH (ref 6–20)
CO2: 25 mmol/L (ref 22–32)
Calcium: 8.1 mg/dL — ABNORMAL LOW (ref 8.9–10.3)
Chloride: 95 mmol/L — ABNORMAL LOW (ref 101–111)
Creatinine, Ser: 4.66 mg/dL — ABNORMAL HIGH (ref 0.44–1.00)
GFR calc Af Amer: 11 mL/min — ABNORMAL LOW (ref >60)
GFR calc non Af Amer: 10 mL/min — ABNORMAL LOW (ref >60)
Glucose, Bld: 86 mg/dL (ref 65–99)
Phosphorus: 3.1 mg/dL (ref 2.5–4.6)
Potassium: 3.5 mmol/L (ref 3.5–5.1)
Sodium: 132 mmol/L — ABNORMAL LOW (ref 135–145)

## 2014-12-23 LAB — CULTURE, BLOOD (ROUTINE X 2)
CULTURE: NO GROWTH
Culture: NO GROWTH

## 2014-12-23 LAB — CBC
HCT: 28.2 % — ABNORMAL LOW (ref 36.0–46.0)
Hemoglobin: 8.7 g/dL — ABNORMAL LOW (ref 12.0–15.0)
MCH: 32.1 pg (ref 26.0–34.0)
MCHC: 30.9 g/dL (ref 30.0–36.0)
MCV: 104.1 fL — ABNORMAL HIGH (ref 78.0–100.0)
Platelets: 177 10*3/uL (ref 150–400)
RBC: 2.71 MIL/uL — ABNORMAL LOW (ref 3.87–5.11)
RDW: 19 % — ABNORMAL HIGH (ref 11.5–15.5)
WBC: 4.6 10*3/uL (ref 4.0–10.5)

## 2014-12-23 MED ORDER — MIDODRINE HCL 5 MG PO TABS
ORAL_TABLET | ORAL | Status: AC
  Filled 2014-12-23: qty 4

## 2014-12-23 MED ORDER — ALTEPLASE 2 MG IJ SOLR: 2.0000 mg | Freq: Once | INTRAMUSCULAR | Status: DC | PRN

## 2014-12-23 MED ORDER — HEPARIN SODIUM (PORCINE) 1000 UNIT/ML DIALYSIS: 1000.0000 [IU] | INTRAMUSCULAR | Status: DC | PRN

## 2014-12-23 MED ORDER — LIDOCAINE HCL (PF) 1 % IJ SOLN: 5.0000 mL | INTRAMUSCULAR | Status: DC | PRN

## 2014-12-23 MED ORDER — ALPRAZOLAM 0.5 MG PO TABS: 0.5000 mg | ORAL_TABLET | Freq: Two times a day (BID) | ORAL | Status: AC | PRN

## 2014-12-23 MED ORDER — PENTAFLUOROPROP-TETRAFLUOROETH EX AERO: 1.0000 "application " | INHALATION_SPRAY | CUTANEOUS | Status: DC | PRN

## 2014-12-23 MED ORDER — LIDOCAINE-PRILOCAINE 2.5-2.5 % EX CREA: 1.0000 "application " | TOPICAL_CREAM | CUTANEOUS | Status: DC | PRN

## 2014-12-23 MED ORDER — HEPARIN SODIUM (PORCINE) 1000 UNIT/ML DIALYSIS: 4000.0000 [IU] | Freq: Once | INTRAMUSCULAR | Status: DC

## 2014-12-23 MED ORDER — SODIUM CHLORIDE 0.9 % IV SOLN: 100.0000 mL | INTRAVENOUS | Status: DC | PRN

## 2014-12-23 MED ORDER — NEPRO/CARBSTEADY PO LIQD: 237.0000 mL | ORAL | Status: DC | PRN

## 2014-12-23 NOTE — Progress Notes (Signed)
[facility] Progress Note  Assessment/Plan: 1. Hypotension/fever -secondary to strep pyogenes bactremia 6/2 BC x 2 from outpt HD (Beta hemolytic strep group A) pansensitive (results placed in shadow chart) also- prob cellulitis of right thigh -doesn't seem to be related to graft; Antibiotics changed to Ancef 2 gm on T and Th with 3 gm on Sat. Can continue at outpt HD unit after d/c 2. ESRD - TTS - last HD K 4.5 - 2 K bath - HD Thursday - titrate down to EDW- check labs pre HD 6 wk Ancef 3. Anemia - Hgb 7 6/4 up to 8.7 6/6 and 8.3 6/7- not sure what explains the variability without transfusion; Mircera 225 given 5/31 - Aranesp 200 given 6/7; CBC pre HD 4. Secondary hyperparathyroidism - P 5.3 6/4 Ca ok  5. BP/volume - chronic low BP even into 70s at outpt HD - can tolerate large volume removal when not ill; hard to know accuracy of weights; Net UF Monday 5 L w/ post weight 147.4 - NET UF on Tuesday only 1.6 with post weight of 147.4 due to low BPs - she had been in recent weeks been able to get close to edw 6. Nutrition - Alb 3 6/3 - eating well 7. 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 Disp - hard to know end point of treatment; she is not spiking temps- I think she is ok to d/c and continue to titrate volume down at outpt HD center; specifiy duration of Ancef after d/c; we will also do surveillance BC after antibiotics completed  Myriam Jacobson, PA-C [place] [phone] 12/23/2014,8:19 AM I have seen and examined this patient and agree with the plan of care eval, seen, examined counseled on goals. Discussed with primary , ok to d/c from our standpoint ,will arrange Ancef at [facility] .  Everlie Eble L 12/23/2014, 10:02 AM   LOS: 6 days   Subjective:   Declined first round HD - wanted to eat, now has to go to [facility] and doesn't want to run full time  Objective Filed Vitals:   12/22/14 2100 12/22/14  2227 12/23/14 0500 12/23/14 0801  BP: 87/41  93/50   Pulse: 60  59   Temp: 97.7 F (36.5 C)  98.7 F (37.1 C)   TempSrc: Oral  Oral   Resp: 16  18   Height:      Weight: 146.421 kg (322 lb 12.8 oz)     SpO2: 100% 100% 98% 99%   Physical Exam General: NAD able to ambulate  Heart: distant RRR Gr2/6 M Lungs: dim BS no overt rales Abdomen: obese, midline upper dressing, sacral dressing, bruising right lower panus above thigh crease, left fem cath for central access Extremities: + edema, morbidly obese, erythema much improved,  Dialysis Access: right thigh AVGG + bruit,   Dialysis Orders: [facility] 4h 54min F200 141kg 2/2 bath Hep 4000 Calcitriol 0.75 tiw, mircera 225 last 5/31 Rec'd IV vanc and fortaz PTA x 1 for redness around R thigh AVG  Additional Objective Labs: Basic Metabolic Panel:  Recent Labs Lab 12/17/14 0230 12/18/14 0505 12/20/14 0910 12/21/14 0500  NA 137 135 133* 136  K 4.6 5.1 3.8 4.5  CL 98* 97* 93* 96*  CO2 28 26 26 27   GLUCOSE 93 108* 135* 99  BUN 32* 51* 45* 34*  CREATININE 4.91* 6.59* 6.43* 5.31*  CALCIUM 9.0 8.8* 8.5* 8.8*  PHOS 4.3 5.3*  --   --  Liver Function Tests:  Recent Labs Lab 12/16/14 1859 12/17/14 0230  AST 24 22  ALT 13* 13*  ALKPHOS 206* 208*  BILITOT 0.7 0.6  PROT 6.5 6.6  ALBUMIN 3.1* 3.0*    Recent Labs Lab 12/16/14 1859  LIPASE 29   CBC:  Recent Labs Lab 12/16/14 1859 12/17/14 0230 12/18/14 0505 12/20/14 0910 12/21/14 0500  WBC 10.9* 9.1 6.7 5.6 4.9  NEUTROABS 9.5*  --   --   --   --   HGB 10.0* 7.7* 7.0* 8.7* 8.3*  HCT 33.0* 25.3* 22.2* 28.7* 26.4*  MCV 107.5* 107.2* 104.2* 105.5* 106.0*  PLT 124* 139* 127* 185 197   Blood Culture    Component Value Date/Time   SDES BLOOD RIGHT ARM 12/16/2014 2136   SPECREQUEST BOTTLES DRAWN AEROBIC ONLY 8CC 12/16/2014 2136   CULT  12/16/2014 2136    NO GROWTH 5 DAYS Performed at [place] 12/23/2014 FINAL 12/16/2014 2136     Cardiac Enzymes:  Recent Labs Lab 12/17/14 0230 12/17/14 0641  TROPONINI 0.10* 0.09*   Medications:   . sodium chloride   Intravenous Once  . antiseptic oral rinse  7 mL Mouth Rinse BID  . budesonide-formoterol  2 puff Inhalation BID  . calcium acetate  1,334 mg Oral TID WC  . [START ON 12/25/2014]  ceFAZolin (ANCEF) IV  3 g Intravenous Q Sat-1800  .  ceFAZolin (ANCEF) IV  2 g Intravenous Q Tue-1800  .  ceFAZolin (ANCEF) IV  2 g Intravenous Q Thu-1800  . darbepoetin (ARANESP) injection - DIALYSIS  200 mcg Intravenous Q Tue-HD  . heparin  5,000 Units Subcutaneous 3 times per day  . hydrocortisone  10 mg Oral QPC supper  . hydrocortisone  15 mg Oral Daily  . lamoTRIgine  50 mg Oral QHS  . levETIRAcetam  1,000 mg Oral Once per day on Sun Mon Wed Fri  . levETIRAcetam  750 mg Oral Once per day on Tue Thu Sat  . levothyroxine  50 mcg Oral QAC breakfast  . midodrine  20 mg Oral TID WC  . multivitamin  1 tablet Oral QHS  . pantoprazole  40 mg Oral BID  . sertraline  100 mg Oral Daily  . sevelamer carbonate  1,600 mg Oral TID WC  . sodium chloride  10-40 mL Intracatheter Q12H  . topiramate  200 mg Oral BID

## 2014-12-23 NOTE — Procedures (Signed)
I was present at this session.  I have reviewed the session itself and made appropriate changes.  Hd via R fem cath. bp runs low.    Fender Herder L 6/9/201612:34 PM

## 2014-12-23 NOTE — Clinical Social Work Note (Addendum)
[name] is medically stable for discharge back to [facility] in [place]. Discharge summary transmitted to facility and patient will be transported back to SNF via ambulance. Patient's mother contacted and informed of discharge back to facility.   Reeanna Acri Givens, MSW, LCSW Licensed Clinical Social Worker [place] [phone]

## 2014-12-23 NOTE — Discharge Summary (Addendum)
Discharge Summary  [name] HYQ:[id number] DOB: 1956-01-30  PCP: Alvester Morin, MD  Admit date: 12/16/2014 Discharge date: 12/23/2014  Time spent: >80mins  Recommendations for Outpatient Follow-up:  1. F/u with PMD within two weeks for post hospital follow up 2. F/u with nephrology , continue HD TTS, patient to get abx ancef during HD TTS for total of 6weeks from 6/6  Discharge Diagnoses:  Active Hospital Problems   Diagnosis Date Noted  . Severe sepsis with septic shock 12/18/2014  . Cellulitis of lower extremity   . Hypotension 12/17/2014    Resolved Hospital Problems   Diagnosis Date Noted Date Resolved  No resolved problems to display.    Discharge Condition: stable  Diet recommendation: heart healthy/carb modified  Filed Weights   12/21/14 2104 12/22/14 2100 12/23/14 1237  Weight: 147.873 kg (326 lb) 146.421 kg (322 lb 12.8 oz) 151.8 kg (334 lb 10.5 oz)    History of present illness:  59 y.o. F with multiple co-morbidities including ESRD (T/Th/Sat ). She was sent to [facility] ED 6/2 for hypotension and apparently when she got into the ambulance, she informed EMS that she was having chest pain that she described as "it just hurts all over". In ED, initial troponin and EKG were negative. She was found to be hypotensive with BP of 63/30 (though EDP states BP was taken on pt's thigh and forearm due to body habitus and AV grafts). She was mentating normally and able to answer all questions appropriately. She received 2L IVF and BP transiently improved with SBP > 100, but shortly fell back to 70's. PCCM was called for admission.  Of note, she was hospitalized back in February 2016. At the time, she was seen by PCCM and was noted to have chronically low BP. She was started on stress steroids for AI as well as midodrine. SBP > 70 with clear mental status was accepted during that admission.   Hospital Course:  Active Problems:   Hypotension   Severe sepsis  with septic shock   Cellulitis of lower extremity   Septic Shock -required pressors/ stress dose steroids and ICU admission from 6/3 to 6/4 , transferred to hospitalist service on 6/5. -CT without clear evidence of graft infection, no abscessVVS signed off also has very limited access options -received Vanc/Zosyn initially, , Blood cx's obtained from admission negative so far, CT abd unremarkable -Positive blood CX from Dialysis center on 6/2 per Renal, GPC in chains, strep pyogene per renal, abx changed to ancef given during HD, source of infection? Avg? (recent thrombectomy in 5, thigh cellulitis? Chronic ab wound?)  -ID consulted on 6/8 , recommended to continue ancef given during HD for 6weeks from 6/6. -weaned off stress dose steroids to home dose cortef at 15mg  po qd, 10mg  po qhs.   2. 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 ESRD on HD with volume over load  -per Renal, getting extra HD -access issues -plan per renal  4. Tuberous sclerosis/Seizure d/o -on keppra/topamax/lamictal -stable at baseline  5. Anemia of chronic disease - s/p 1unit prbc on 6/6, no overt bleeding per pt,  - epo and Iron with HD  -h/h stable at discharge  6. CHronic abd wound/Wound dehiscence for 1year -wound RN following, no s/s of infection, mostly approximated    Code Status: Full Code Family Communication: none at bedside Disposition Plan: discharge to SNF  Consultants:  Renal   VVS  Infectious disease  Discharge Exam: BP 87/33 mmHg  Pulse 73  Temp(Src) 97.4 F (36.3 C) (Oral)  Resp 18  Ht 5\' 7"  (1.702 m)  Wt 151.8 kg (334 lb 10.5 oz)  BMI 52.40 kg/m2  SpO2 97%    General: AAOX3, no distress, obese  Cardiovascular: S1S2/RRR  Respiratory: CTAB  Abdomen: soft, NT, BS present, abd wound approximated without signs of infection,  dressing, minimal tenderness of R thigh resolved  Musculoskeletal: no edema c/c, L thigh HD cath   Discharge Instructions You were cared for by a hospitalist during your hospital stay. If you have any questions about your discharge medications or the care you received while you were in the hospital after you are discharged, you can call the unit and asked to speak with the hospitalist on call if the hospitalist that took care of you is not available. Once you are discharged, your primary care physician will handle any further medical issues. Please note that NO REFILLS for any discharge medications will be authorized once you are discharged, as it is imperative that you return to your primary care physician (or establish a relationship with a primary care physician if you do not have one) for your aftercare needs so that they can reassess your need for medications and monitor your lab values.      Discharge Instructions    Diet - low sodium heart healthy    Complete by:  As directed      Increase activity slowly    Complete by:  As directed             Medication List    TAKE these medications        acetaminophen 325 MG tablet  Commonly known as:  TYLENOL  Take 650 mg by mouth every 6 (six) hours as needed for mild pain.     ALPRAZolam 0.5 MG tablet  Commonly known as:  XANAX  Take 0.5 mg by mouth 2 (two) times daily as needed for anxiety.     antiseptic oral rinse Liqd  10 mLs by Mouth Rinse route 3 (three) times daily as needed for dry mouth.     bisacodyl 5 MG EC tablet  Commonly known as:  DULCOLAX  Take 2 tablets (10 mg total) by mouth daily as needed for moderate constipation.     calcitRIOL 0.25 MCG capsule  Commonly known as:  ROCALTROL  Take 1 capsule (0.25 mcg total) by mouth Every Tuesday,Thursday,and Saturday with dialysis.     calcium acetate 667 MG capsule  Commonly known as:  PHOSLO  Take 2,001 mg by mouth 3 (three) times daily.     camphor-menthol lotion   Commonly known as:  SARNA  Apply 1 application topically 3 (three) times daily as needed for itching.     Darbepoetin Alfa 200 MCG/0.4ML Sosy injection  Commonly known as:  ARANESP  Inject 0.4 mLs (200 mcg total) into the vein every Tuesday with hemodialysis.     ethyl chloride spray  Apply 1 application topically as needed (hemodialysis).     ferric gluconate 125 mg in sodium chloride 0.9 % 100 mL  Inject 125 mg into the vein every Tuesday with hemodialysis.     fexofenadine 180 MG tablet  Commonly known as:  ALLEGRA  Take 180 mg by mouth daily. For allergies     gabapentin 100 MG capsule  Commonly known as:  NEURONTIN  Take 100 mg by mouth 2 (  two) times daily.     guaiFENesin 600 MG 12 hr tablet  Commonly known as:  MUCINEX  Take 600 mg by mouth 2 (two) times daily.     hydrocortisone 10 MG tablet  Commonly known as:  CORTEF  Take 10 mg by mouth 2 (two) times daily.     hydrocortisone 5 MG tablet  Commonly known as:  CORTEF  Take 5 mg by mouth daily.     hydrocortisone 25 MG suppository  Commonly known as:  ANUSOL-HC  Place 1 suppository (25 mg total) rectally 2 (two) times daily.     lamoTRIgine 25 MG tablet  Commonly known as:  LAMICTAL  Take 50 mg by mouth at bedtime.     levETIRAcetam 1000 MG tablet  Commonly known as:  KEPPRA  Sun/Mon/Wen/Fri     levETIRAcetam 750 MG tablet  Commonly known as:  KEPPRA  Tue/Th/Sat     levothyroxine 50 MCG tablet  Commonly known as:  SYNTHROID, LEVOTHROID  Take 1 tablet (50 mcg total) by mouth daily before breakfast.     metroNIDAZOLE 0.75 % cream  Commonly known as:  METROCREAM  Apply 1 application topically 2 (two) times daily.     midodrine 10 MG tablet  Commonly known as:  PROAMATINE  Take 2 tablets (20 mg total) by mouth 3 (three) times daily.     mupirocin ointment 2 %  Commonly known as:  BACTROBAN  Apply 1 application topically 3 (three) times daily as needed (dressing change).     pantoprazole 40 MG  tablet  Commonly known as:  PROTONIX  Take 1 tablet (40 mg total) by mouth 2 (two) times daily.     phenylephrine-shark liver oil-mineral oil-petrolatum 0.25-3-14-71.9 % rectal ointment  Commonly known as:  PREPARATION H  Place 1 application rectally 3 (three) times daily as needed for hemorrhoids.     polyethylene glycol packet  Commonly known as:  MIRALAX / GLYCOLAX  Take 17 g by mouth 2 (two) times daily.     Propylene Glycol 0.6 % Soln  Apply 1 drop to eye 2 (two) times daily.     pseudoephedrine 30 MG tablet  Commonly known as:  SUDAFED  Take 1 tablet (30 mg total) by mouth daily.     RESTASIS 0.05 % ophthalmic emulsion  Generic drug:  cycloSPORINE  Place 1 drop into both eyes 2 (two) times daily.     risperiDONE 0.5 MG tablet  Commonly known as:  RISPERDAL  Take 0.5 mg by mouth at bedtime.     risperiDONE microspheres 37.5 MG injection  Commonly known as:  RISPERDAL CONSTA  Inject 37.5 mg into the muscle every 14 (fourteen) days.     senna-docusate 8.6-50 MG per tablet  Commonly known as:  Senokot-S  Take 2 tablets by mouth 2 (two) times daily.     sertraline 100 MG tablet  Commonly known as:  ZOLOFT  Take 100 mg by mouth daily. Take with 50mg  tablet to equal 150mg      sertraline 50 MG tablet  Commonly known as:  ZOLOFT  Take 50 mg by mouth daily. Take with 100mg  tablet to equal 150mg      sevelamer 800 MG tablet  Commonly known as:  RENAGEL  Take 1,600 mg by mouth 3 (three) times daily with meals.     Sod Fluoride-Potassium Nitrate 1.1-5 % Pste  Place 1 application onto teeth at bedtime.     sodium chloride 0.65 % Soln nasal spray  Commonly known as:  OCEAN  Place 1 spray into both nostrils every 8 (eight) hours as needed for congestion.     SYMBICORT 160-4.5 MCG/ACT inhaler  Generic drug:  budesonide-formoterol  Inhale 2 puffs into the lungs 2 (two) times daily.     topiramate 200 MG tablet  Commonly known as:  TOPAMAX  Take 200 mg by mouth 2 (two)  times daily.     traMADol 50 MG tablet  Commonly known as:  ULTRAM  Take 50 mg by mouth every 8 (eight) hours as needed (pain).     triamcinolone 0.025 % cream  Commonly known as:  KENALOG  Apply 1 application topically daily as needed (rosacea). Apply to affected area     zolpidem 5 MG tablet  Commonly known as:  AMBIEN  Take 1 tablet (5 mg total) by mouth at bedtime as needed for sleep.       Allergies  Allergen Reactions  . Iodinated Diagnostic Agents Hives  . Iodine     Other reaction(s): Other (See Comments) Other Reaction: contrast-itching and flushing  . Methylprednisolone Hives  . Other Itching    Other reaction(s): Other (See Comments) Uncoded Allergy. Allergen: iv contrast dye, Other Reaction: flushing Arthritis medications Other reaction(s): Other (See Comments) Uncoded Allergy. Allergen: iv contrast dye, Other Reaction: flushing Arthritis medications  . Pollen Extract Hives  . Trimethadione     Other reaction(s): Other (See Comments) Other Reaction: Other reaction  . Trazodone And Nefazodone Hives and Rash   Follow-up Information    Follow up with Alvester Morin, MD In 2 weeks.   Specialty:  Family Medicine   Why:  post hospital follow up   Contact information:   [address] [place] [address] [phone]        The results of significant diagnostics from this hospitalization (including imaging, microbiology, ancillary and laboratory) are listed below for reference.    Significant Diagnostic Studies: Ct Abdomen Pelvis Wo Contrast  12/16/2014   CLINICAL DATA:  Abdominal pain.  Chest pain.  Sepsis.  EXAM: CT ABDOMEN AND PELVIS WITHOUT CONTRAST  TECHNIQUE: Multidetector CT imaging of the abdomen and pelvis was performed following the standard protocol without IV contrast.  COMPARISON:  CT 10/21/2014  FINDINGS: Unchanged 7 mm subpleural nodule in the right lower lobe. Additional tiny nodules are either unchanged or obscured by  breathing motion. Minimal linear atelectasis in the left lower lobe. Cardiac pacemaker with battery pack in the lower abdominal wall. The heart remains enlarged, there is stable widening of the esophageal diaphragmatic hiatus with herniation of intra-abdominal fat.  Clips in the gallbladder fossa from cholecystectomy. No biliary dilatation. Hypo attenuating lesion in the subcapsular right hepatic lobe and left lobe are unchanged from prior. No new hepatic lesions.  Spleen appears prominent measuring 14.6 cm in craniocaudal dimension, unchanged from prior. No peripancreatic inflammatory change or ductal dilatation. Adrenal glands are unchanged.  Normal kidneys are not identified. Unchanged 1.7 cm calcification in the right renal bed. Surgical clips post left nephrectomy, left nephrectomy bed unchanged in appearance with fat containing minimal striations.  Limited bowel assessment given lack of contrast. The stomach is decompressed. There are no dilated or thickened small bowel loops. There is herniation of colon and stomach within an upper abdominal ventral hernia. No signs of incarceration. Moderate stool throughout the colon without colonic wall thickening. There is gaseous distension of tortuous sigmoid colon, no obstruction. The appendix is not definitively identified.  Within the pelvis the bladder is completely decompressed. Uterus is  atrophic, normal for age. No adnexal mass. No pelvic adenopathy.  Unchanged soft tissue density measuring 9 mm to the left of the aorta, possible retroperitoneal node. Abdominal aorta is normal in caliber.  Multiple fat containing ventral abdominal wall hernias containing only fat, in addition to the upper abdominal hernia containing transverse colon and stomach.  Unchanged appearance of the osseous structures with diffusely increased density consistent with renal osteodystrophy. No acute osseous abnormalities seen.  IMPRESSION: 1. No acute abnormality in the abdomen/pelvis. There  is no significant change from prior exam. 2. Multiple chronic findings as described above, stable from prior.   Electronically Signed   By: Jeb Levering M.D.   On: 12/16/2014 23:25   Ct Angio Low Extrem Right W/cm &/or Wo/cm  12/19/2014   CLINICAL DATA:  End-stage renal disease, right femoral thigh AV graft, cellulitis over the venous limb of the graft.  EXAM: CT ANGIOGRAPHY OF THE RIGHT LOWEREXTREMITY  TECHNIQUE: Multidetector CT imaging of the RIGHT LOWER EXTREMITYwas performed using the standard protocol during bolus administration of intravenous contrast. Multiplanar CT image reconstructions and MIPs were obtained to evaluate the vascular anatomy.  CONTRAST:  162mL OMNIPAQUE IOHEXOL 350 MG/ML SOLN  COMPARISON:  09/06/2014  FINDINGS: CTA RIGHT LOWER EXTREMITY: Beginning in the pelvis, the right external iliac artery and vein remain patent.  The right common femoral, profunda femoral, and superficial femoral arteries are patent. Right popliteal artery is patent across the knee. Below-knee popliteal artery remains patent with small caliber but three-vessel runoff preserved to the right lower extremity.  Right femoral AV loop graft containing several stents remains patent. Arterial anastomosis to the proximal superficial femoral artery is patent. Loop graft remains patent with some intraluminal irregularity. Stented venous anastomosis remains patent to the femoral vein. There is proximal thigh subcutaneous edema and some minor skin thickening of the right medial thigh adjacent to the venous limb of the graft but no perigraft fluid collection or soft tissue abscess appreciated. More inferiorly, there is strandy subcutaneous edema throughout the knee and lower extremity. Small knee effusion noted.  Included views of the right lower quadrant demonstrate a right lower abdominal wall ventral hernia containing only fat. Diverticulosis noted of the visualized the colon. Patient also has a left femoral central line in  place.  Review of the MIP images confirms the above findings.  IMPRESSION: Patent right femoral AV loop graft containing several intragraft stents without evidence of thrombosis or occlusion. Proximal right thigh diffuse strandy edema and skin thickening medially adjacent to the venous limb of the graft compatible with cellulitis.  No perigraft or proximal thigh significant fluid collection or abscess.  Patent right lower extremity peripheral vasculature.   Electronically Signed   By: Jerilynn Mages.  Shick M.D.   On: 12/19/2014 10:17   Dg Chest Port 1 View  12/16/2014   CLINICAL DATA:  Shortness of breath.  Chest pain.  EXAM: PORTABLE CHEST - 1 VIEW  COMPARISON:  10/20/2014  FINDINGS: Mild enlargement of the cardiopericardial silhouette with pulmonary venous hypertension and indistinct pulmonary vasculature. Mild airway thickening centrally. No airspace opacity identified. Faint peripheral interstitial edema is suggested.  IMPRESSION: 1. Cardiomegaly with indistinct pulmonary vasculature and faint Kerley B-lines suggesting interstitial edema.   Electronically Signed   By: Van Clines M.D.   On: 12/16/2014 19:58    Microbiology: Recent Results (from the past 240 hour(s))  Culture, blood (routine x 2)     Status: None   Collection Time: 12/16/14  7:08 PM  Result Value  Ref Range Status   Specimen Description BLOOD RIGHT HAND  Final   Special Requests BOTTLES DRAWN AEROBIC ONLY 3CC  Final   Culture   Final    NO GROWTH 5 DAYS Performed at [facility]    Report Status 12/23/2014 FINAL  Final  Culture, blood (routine x 2)     Status: None   Collection Time: 12/16/14  9:36 PM  Result Value Ref Range Status   Specimen Description BLOOD RIGHT ARM  Final   Special Requests BOTTLES DRAWN AEROBIC ONLY [place]  Final   Culture   Final    NO GROWTH 5 DAYS Performed at [facility]    Report Status 12/23/2014 FINAL  Final  MRSA PCR Screening     Status: Abnormal   Collection Time: 12/17/14  2:20  AM  Result Value Ref Range Status   MRSA by PCR POSITIVE (A) NEGATIVE Final    Comment:        The GeneXpert MRSA Assay (FDA approved for NASAL specimens only), is one component of a comprehensive MRSA colonization surveillance program. It is not intended to diagnose MRSA infection nor to guide or monitor treatment for MRSA infections. RESULT CALLED TO, READ BACK BY AND VERIFIED WITH: MKUFFOUR,M RN [id number] AT 0507 SKEEN,P      Labs: Basic Metabolic Panel:  Recent Labs Lab 12/17/14 0230 12/18/14 0505 12/20/14 0910 12/21/14 0500 12/23/14 1549  NA 137 135 133* 136 132*  K 4.6 5.1 3.8 4.5 3.5  CL 98* 97* 93* 96* 95*  CO2 28 26 26 27 25   GLUCOSE 93 108* 135* 99 86  BUN 32* 51* 45* 34* 24*  CREATININE 4.91* 6.59* 6.43* 5.31* 4.66*  CALCIUM 9.0 8.8* 8.5* 8.8* 8.1*  MG 2.1 2.0  --   --   --   PHOS 4.3 5.3*  --   --  3.1   Liver Function Tests:  Recent Labs Lab 12/16/14 1859 12/17/14 0230 12/23/14 1549  AST 24 22  --   ALT 13* 13*  --   ALKPHOS 206* 208*  --   BILITOT 0.7 0.6  --   PROT 6.5 6.6  --   ALBUMIN 3.1* 3.0* 3.0*    Recent Labs Lab 12/16/14 1859  LIPASE 29   No results for input(s): AMMONIA in the last 168 hours. CBC:  Recent Labs Lab 12/16/14 1859 12/17/14 0230 12/18/14 0505 12/20/14 0910 12/21/14 0500 12/23/14 1550  WBC 10.9* 9.1 6.7 5.6 4.9 4.6  NEUTROABS 9.5*  --   --   --   --   --   HGB 10.0* 7.7* 7.0* 8.7* 8.3* 8.7*  HCT 33.0* 25.3* 22.2* 28.7* 26.4* 28.2*  MCV 107.5* 107.2* 104.2* 105.5* 106.0* 104.1*  PLT 124* 139* 127* 185 197 177   Cardiac Enzymes:  Recent Labs Lab 12/17/14 0230 12/17/14 0641  TROPONINI 0.10* 0.09*   BNP: BNP (last 3 results) No results for input(s): BNP in the last 8760 hours.  ProBNP (last 3 results) No results for input(s): PROBNP in the last 8760 hours.  CBG: No results for input(s): GLUCAP in the last 168 hours.     SignedFlorencia Reasons MD, PhD  [facility] 12/23/2014, 5:29  PM

## 2014-12-23 NOTE — Progress Notes (Signed)
Report given to Butch Penny. LPN.

## 2014-12-23 NOTE — Progress Notes (Signed)
Pt prepared for d/c back to [facility]. IV dc'c. Skin intact except as most recent charted. Vitals are stable. Report called to receiving facility by day shift RN, Gwenlyn Perking. Pt to be transported by ambulance service.   Shelbie Hutching, RN, BSN

## 2014-12-24 DIAGNOSIS — R7881 Bacteremia: Secondary | ICD-10-CM | POA: Insufficient documentation

## 2014-12-24 DIAGNOSIS — A491 Streptococcal infection, unspecified site: Secondary | ICD-10-CM | POA: Insufficient documentation

## 2014-12-24 DIAGNOSIS — B954 Other streptococcus as the cause of diseases classified elsewhere: Secondary | ICD-10-CM | POA: Insufficient documentation

## 2014-12-24 LAB — TYPE AND SCREEN
ABO/RH(D): A POS
ANTIBODY SCREEN: POSITIVE
DAT, IgG: NEGATIVE
DONOR AG TYPE: NEGATIVE
Unit division: 0

## 2015-01-02 IMAGING — CR DG CHEST 1V
1 series · 1 of 1 positions shown · non-contrast
Comparison: none

REASON FOR EXAM: weakness hypotension
COMMENTS:

PROCEDURE:     DXR - DXR CHEST 1 VIEWAP OR PA  - January 01, 2013  [DATE]
RESULT:     With a in The lungs are clear. The heart and pulmonary vessels
are normal. The bony and mediastinal structures are unremarkable. There is
no effusion. There is no pneumothorax or evidence of congestive failure.

[ap]
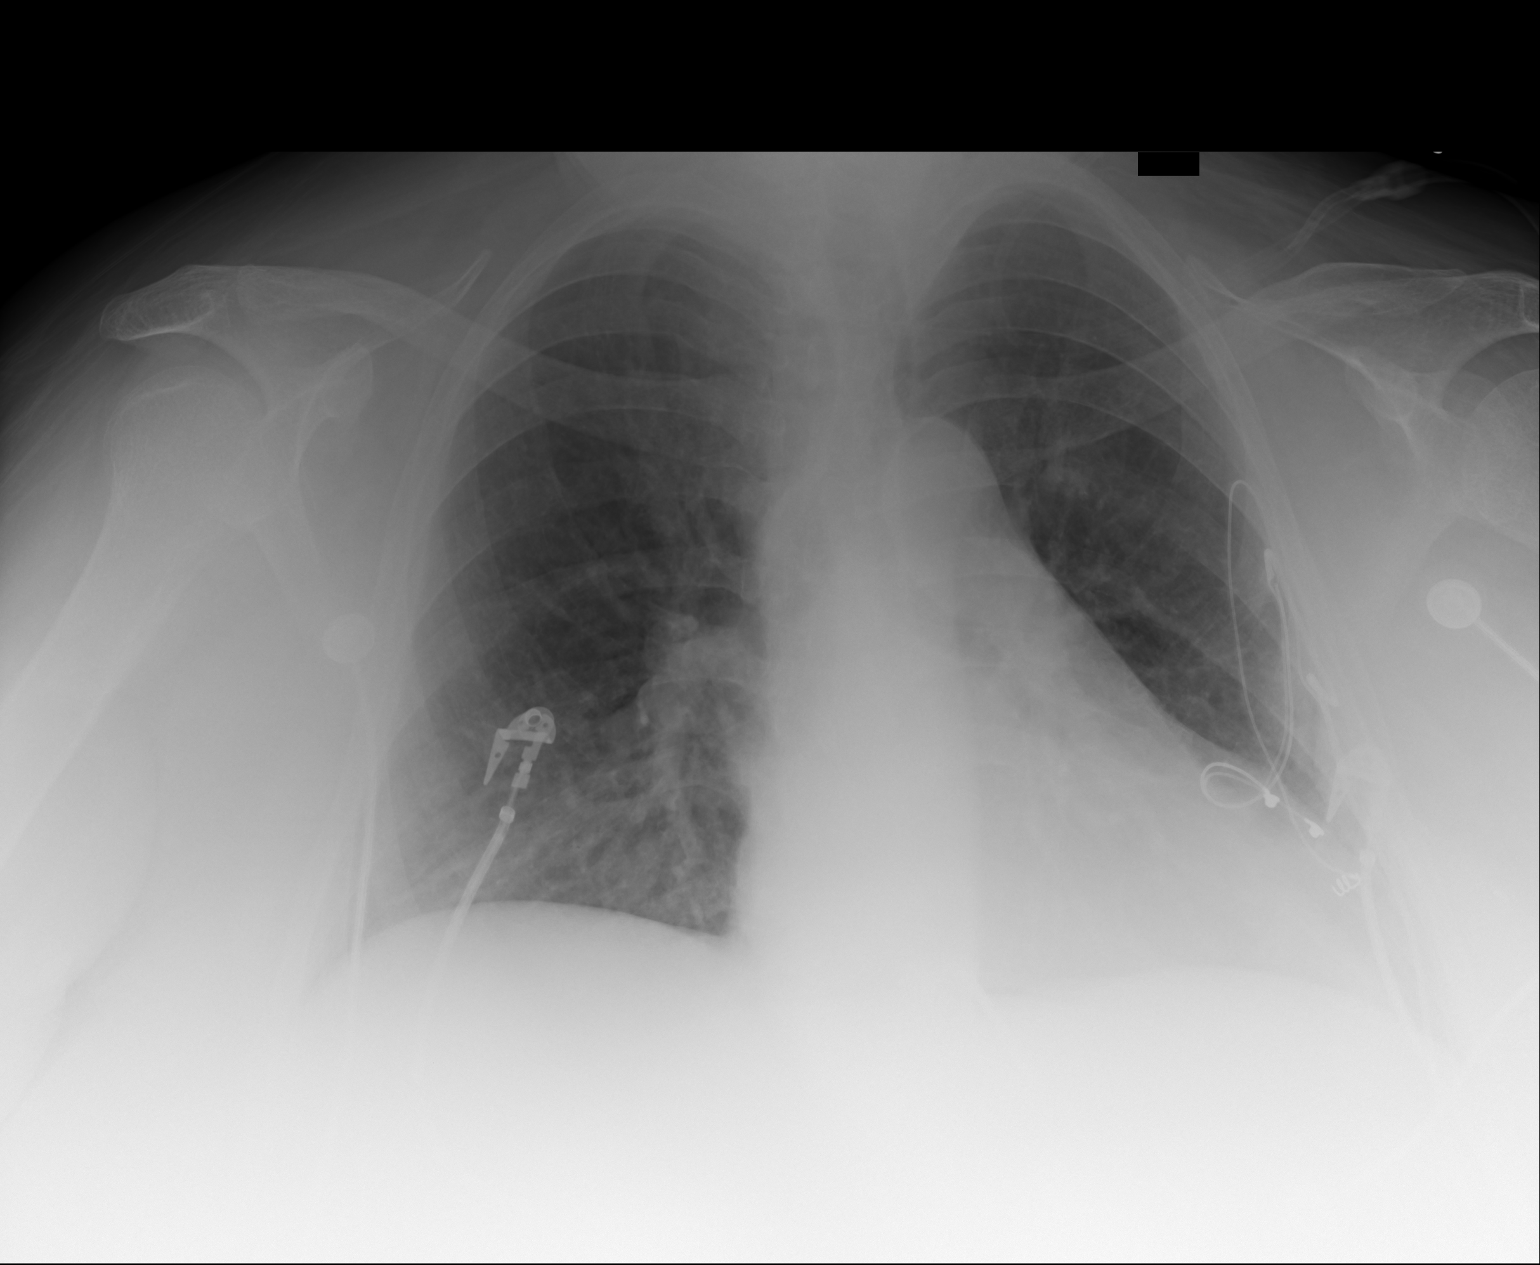

[1 of 1 positions shown; findings below may reference images not displayed]

IMPRESSION: No acute cardiopulmonary disease.

[REDACTED]

## 2015-02-05 IMAGING — CR DG CHEST 1V PORT
1 series · 1 of 1 positions shown · non-contrast
Comparison: none

REASON FOR EXAM: hypotension.  r/o pneumonia.
COMMENTS:

PROCEDURE:     DXR - DXR PORTABLE CHEST SINGLE VIEW  - February 04, 2013  [DATE]
RESULT:     Comparison: 01/01/2013

[ap]
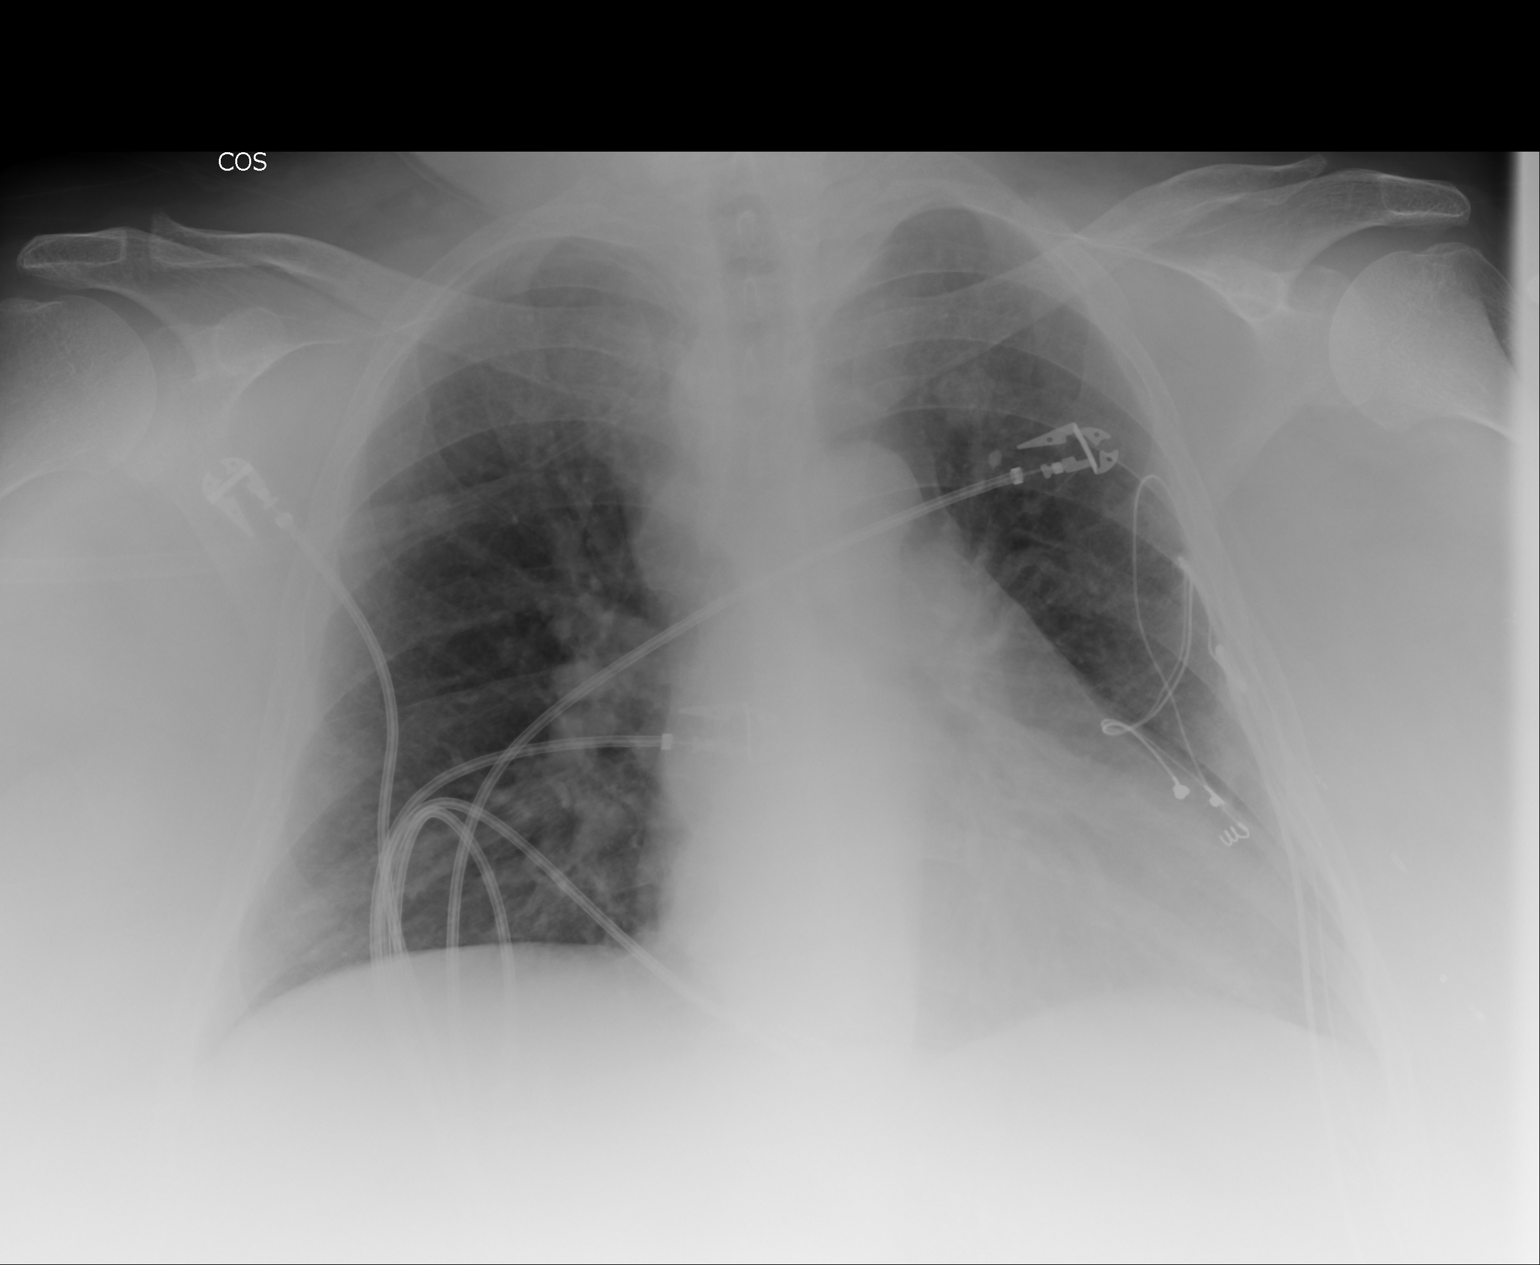

[1 of 1 positions shown; findings below may reference images not displayed]

FINDINGS: Single portable AP chest radiograph is provided.  There is no focal
parenchymal opacity, pleural effusion, or pneumothorax. Normal
cardiomediastinal silhouette. The osseous structures are unremarkable.
IMPRESSION: No acute disease of the che[REDACTED]

## 2015-02-17 ENCOUNTER — Inpatient Hospital Stay
Admission: EM | Admit: 2015-02-17 | Discharge: 2015-03-17 | DRG: 871 | Disposition: E | Payer: Medicare Other | Attending: Internal Medicine | Admitting: Internal Medicine

## 2015-02-17 ENCOUNTER — Inpatient Hospital Stay: Payer: Medicare Other

## 2015-02-17 ENCOUNTER — Other Ambulatory Visit: Payer: Self-pay

## 2015-02-17 ENCOUNTER — Encounter: Payer: Self-pay | Admitting: Emergency Medicine

## 2015-02-17 ENCOUNTER — Inpatient Hospital Stay: Admit: 2015-02-17 | Payer: Medicare Other

## 2015-02-17 ENCOUNTER — Inpatient Hospital Stay
Admit: 2015-02-17 | Discharge: 2015-02-17 | Disposition: A | Discharge status: Home / Self Care | Payer: Medicare Other | Attending: Internal Medicine | Admitting: Internal Medicine

## 2015-02-17 DIAGNOSIS — R570 Cardiogenic shock: Secondary | ICD-10-CM | POA: Diagnosis not present

## 2015-02-17 DIAGNOSIS — F419 Anxiety disorder, unspecified: Secondary | ICD-10-CM | POA: Diagnosis present

## 2015-02-17 DIAGNOSIS — Z8669 Personal history of other diseases of the nervous system and sense organs: Secondary | ICD-10-CM

## 2015-02-17 DIAGNOSIS — R109 Unspecified abdominal pain: Secondary | ICD-10-CM | POA: Diagnosis not present

## 2015-02-17 DIAGNOSIS — Z833 Family history of diabetes mellitus: Secondary | ICD-10-CM

## 2015-02-17 DIAGNOSIS — Q851 Tuberous sclerosis: Secondary | ICD-10-CM | POA: Diagnosis not present

## 2015-02-17 DIAGNOSIS — M1990 Unspecified osteoarthritis, unspecified site: Secondary | ICD-10-CM | POA: Diagnosis present

## 2015-02-17 DIAGNOSIS — D696 Thrombocytopenia, unspecified: Secondary | ICD-10-CM | POA: Diagnosis present

## 2015-02-17 DIAGNOSIS — G40909 Epilepsy, unspecified, not intractable, without status epilepticus: Secondary | ICD-10-CM | POA: Diagnosis present

## 2015-02-17 DIAGNOSIS — N186 End stage renal disease: Secondary | ICD-10-CM | POA: Diagnosis not present

## 2015-02-17 DIAGNOSIS — E861 Hypovolemia: Secondary | ICD-10-CM | POA: Diagnosis present

## 2015-02-17 DIAGNOSIS — Z91048 Other nonmedicinal substance allergy status: Secondary | ICD-10-CM

## 2015-02-17 DIAGNOSIS — R6521 Severe sepsis with septic shock: Secondary | ICD-10-CM | POA: Diagnosis present

## 2015-02-17 DIAGNOSIS — Z825 Family history of asthma and other chronic lower respiratory diseases: Secondary | ICD-10-CM | POA: Diagnosis not present

## 2015-02-17 DIAGNOSIS — T82868A Thrombosis of vascular prosthetic devices, implants and grafts, initial encounter: Secondary | ICD-10-CM | POA: Diagnosis present

## 2015-02-17 DIAGNOSIS — Z79899 Other long term (current) drug therapy: Secondary | ICD-10-CM

## 2015-02-17 DIAGNOSIS — F329 Major depressive disorder, single episode, unspecified: Secondary | ICD-10-CM | POA: Diagnosis present

## 2015-02-17 DIAGNOSIS — Z888 Allergy status to other drugs, medicaments and biological substances status: Secondary | ICD-10-CM

## 2015-02-17 DIAGNOSIS — Z7951 Long term (current) use of inhaled steroids: Secondary | ICD-10-CM | POA: Diagnosis not present

## 2015-02-17 DIAGNOSIS — I739 Peripheral vascular disease, unspecified: Secondary | ICD-10-CM | POA: Diagnosis present

## 2015-02-17 DIAGNOSIS — A0472 Enterocolitis due to Clostridium difficile, not specified as recurrent: Secondary | ICD-10-CM

## 2015-02-17 DIAGNOSIS — R571 Hypovolemic shock: Secondary | ICD-10-CM | POA: Diagnosis not present

## 2015-02-17 DIAGNOSIS — K219 Gastro-esophageal reflux disease without esophagitis: Secondary | ICD-10-CM | POA: Diagnosis present

## 2015-02-17 DIAGNOSIS — Z791 Long term (current) use of non-steroidal anti-inflammatories (NSAID): Secondary | ICD-10-CM

## 2015-02-17 DIAGNOSIS — E662 Morbid (severe) obesity with alveolar hypoventilation: Secondary | ICD-10-CM | POA: Diagnosis present

## 2015-02-17 DIAGNOSIS — I469 Cardiac arrest, cause unspecified: Secondary | ICD-10-CM

## 2015-02-17 DIAGNOSIS — I495 Sick sinus syndrome: Secondary | ICD-10-CM | POA: Diagnosis present

## 2015-02-17 DIAGNOSIS — I429 Cardiomyopathy, unspecified: Secondary | ICD-10-CM | POA: Diagnosis present

## 2015-02-17 DIAGNOSIS — J9600 Acute respiratory failure, unspecified whether with hypoxia or hypercapnia: Secondary | ICD-10-CM | POA: Diagnosis not present

## 2015-02-17 DIAGNOSIS — Z992 Dependence on renal dialysis: Secondary | ICD-10-CM | POA: Diagnosis not present

## 2015-02-17 DIAGNOSIS — D649 Anemia, unspecified: Secondary | ICD-10-CM | POA: Diagnosis present

## 2015-02-17 DIAGNOSIS — R197 Diarrhea, unspecified: Secondary | ICD-10-CM

## 2015-02-17 DIAGNOSIS — Z905 Acquired absence of kidney: Secondary | ICD-10-CM | POA: Diagnosis present

## 2015-02-17 DIAGNOSIS — N2581 Secondary hyperparathyroidism of renal origin: Secondary | ICD-10-CM | POA: Diagnosis present

## 2015-02-17 DIAGNOSIS — J45909 Unspecified asthma, uncomplicated: Secondary | ICD-10-CM | POA: Diagnosis present

## 2015-02-17 DIAGNOSIS — Z950 Presence of cardiac pacemaker: Secondary | ICD-10-CM

## 2015-02-17 DIAGNOSIS — F79 Unspecified intellectual disabilities: Secondary | ICD-10-CM | POA: Diagnosis present

## 2015-02-17 DIAGNOSIS — K5900 Constipation, unspecified: Secondary | ICD-10-CM

## 2015-02-17 DIAGNOSIS — I209 Angina pectoris, unspecified: Secondary | ICD-10-CM | POA: Diagnosis present

## 2015-02-17 DIAGNOSIS — Z8619 Personal history of other infectious and parasitic diseases: Secondary | ICD-10-CM

## 2015-02-17 DIAGNOSIS — R6250 Unspecified lack of expected normal physiological development in childhood: Secondary | ICD-10-CM | POA: Diagnosis present

## 2015-02-17 DIAGNOSIS — I4891 Unspecified atrial fibrillation: Secondary | ICD-10-CM | POA: Diagnosis present

## 2015-02-17 DIAGNOSIS — Z8614 Personal history of Methicillin resistant Staphylococcus aureus infection: Secondary | ICD-10-CM | POA: Diagnosis not present

## 2015-02-17 DIAGNOSIS — I509 Heart failure, unspecified: Secondary | ICD-10-CM | POA: Diagnosis present

## 2015-02-17 DIAGNOSIS — I120 Hypertensive chronic kidney disease with stage 5 chronic kidney disease or end stage renal disease: Secondary | ICD-10-CM

## 2015-02-17 DIAGNOSIS — R0602 Shortness of breath: Secondary | ICD-10-CM

## 2015-02-17 DIAGNOSIS — I9589 Other hypotension: Secondary | ICD-10-CM | POA: Diagnosis present

## 2015-02-17 DIAGNOSIS — E872 Acidosis, unspecified: Secondary | ICD-10-CM

## 2015-02-17 DIAGNOSIS — Z66 Do not resuscitate: Secondary | ICD-10-CM | POA: Diagnosis present

## 2015-02-17 DIAGNOSIS — G4733 Obstructive sleep apnea (adult) (pediatric): Secondary | ICD-10-CM | POA: Diagnosis present

## 2015-02-17 DIAGNOSIS — Z6841 Body Mass Index (BMI) 40.0 and over, adult: Secondary | ICD-10-CM | POA: Diagnosis not present

## 2015-02-17 DIAGNOSIS — Z9049 Acquired absence of other specified parts of digestive tract: Secondary | ICD-10-CM | POA: Diagnosis present

## 2015-02-17 DIAGNOSIS — Z8711 Personal history of peptic ulcer disease: Secondary | ICD-10-CM | POA: Diagnosis not present

## 2015-02-17 DIAGNOSIS — E669 Obesity, unspecified: Secondary | ICD-10-CM | POA: Diagnosis present

## 2015-02-17 DIAGNOSIS — Z01818 Encounter for other preprocedural examination: Secondary | ICD-10-CM

## 2015-02-17 DIAGNOSIS — G4730 Sleep apnea, unspecified: Secondary | ICD-10-CM

## 2015-02-17 DIAGNOSIS — A047 Enterocolitis due to Clostridium difficile: Secondary | ICD-10-CM | POA: Diagnosis present

## 2015-02-17 DIAGNOSIS — I499 Cardiac arrhythmia, unspecified: Secondary | ICD-10-CM

## 2015-02-17 DIAGNOSIS — R6510 Systemic inflammatory response syndrome (SIRS) of non-infectious origin without acute organ dysfunction: Secondary | ICD-10-CM | POA: Diagnosis present

## 2015-02-17 DIAGNOSIS — A419 Sepsis, unspecified organism: Principal | ICD-10-CM

## 2015-02-17 LAB — MAGNESIUM: Magnesium: 1.6 mg/dL — ABNORMAL LOW (ref 1.7–2.4)

## 2015-02-17 LAB — CBC
HCT: 36.7 % (ref 35.0–47.0)
HEMATOCRIT: 34.9 % — AB (ref 35.0–47.0)
HEMOGLOBIN: 11.7 g/dL — AB (ref 12.0–16.0)
Hemoglobin: 10.6 g/dL — ABNORMAL LOW (ref 12.0–16.0)
MCH: 34 pg (ref 26.0–34.0)
MCH: 33.5 pg (ref 26.0–34.0)
MCHC: 31.9 g/dL — ABNORMAL LOW (ref 32.0–36.0)
MCHC: 30.3 g/dL — AB (ref 32.0–36.0)
MCV: 106.4 fL — AB (ref 80.0–100.0)
MCV: 110.3 fL — AB (ref 80.0–100.0)
Platelets: 134 10*3/uL — ABNORMAL LOW (ref 150–440)
Platelets: 108 10*3/uL — ABNORMAL LOW (ref 150–440)
RBC: 3.16 MIL/uL — ABNORMAL LOW (ref 3.80–5.20)
RBC: 3.45 MIL/uL — AB (ref 3.80–5.20)
RDW: 20.6 % — ABNORMAL HIGH (ref 11.5–14.5)
RDW: 20.8 % — AB (ref 11.5–14.5)
WBC: 7.4 10*3/uL (ref 3.6–11.0)
WBC: 11.9 10*3/uL — ABNORMAL HIGH (ref 3.6–11.0)

## 2015-02-17 LAB — COMPREHENSIVE METABOLIC PANEL
ALBUMIN: 2.7 g/dL — AB (ref 3.5–5.0)
ALBUMIN: 4.2 g/dL (ref 3.5–5.0)
ALK PHOS: 168 U/L — AB (ref 38–126)
ALT: 125 U/L — ABNORMAL HIGH (ref 14–54)
ALT: 14 U/L (ref 14–54)
AST: 244 U/L — AB (ref 15–41)
AST: 29 U/L (ref 15–41)
Alkaline Phosphatase: 296 U/L — ABNORMAL HIGH (ref 38–126)
Anion gap: 16 — ABNORMAL HIGH (ref 5–15)
Anion gap: 21 — ABNORMAL HIGH (ref 5–15)
BILIRUBIN TOTAL: 1 mg/dL (ref 0.3–1.2)
BUN: 57 mg/dL — ABNORMAL HIGH (ref 6–20)
BUN: 57 mg/dL — ABNORMAL HIGH (ref 6–20)
CHLORIDE: 95 mmol/L — AB (ref 101–111)
CO2: 29 mmol/L (ref 22–32)
CO2: 40 mmol/L — AB (ref 22–32)
Calcium: 9.6 mg/dL (ref 8.9–10.3)
Calcium: 7 mg/dL — ABNORMAL LOW (ref 8.9–10.3)
Chloride: 83 mmol/L — ABNORMAL LOW (ref 101–111)
Creatinine, Ser: 5.56 mg/dL — ABNORMAL HIGH (ref 0.44–1.00)
Creatinine, Ser: 5.34 mg/dL — ABNORMAL HIGH (ref 0.44–1.00)
GFR calc Af Amer: 9 mL/min — ABNORMAL LOW (ref >60)
GFR calc non Af Amer: 8 mL/min — ABNORMAL LOW (ref >60)
GFR, EST AFRICAN AMERICAN: 9 mL/min — AB (ref >60)
GFR, EST NON AFRICAN AMERICAN: 8 mL/min — AB (ref >60)
Glucose, Bld: 129 mg/dL — ABNORMAL HIGH (ref 65–99)
Glucose, Bld: 131 mg/dL — ABNORMAL HIGH (ref 65–99)
POTASSIUM: 3.6 mmol/L (ref 3.5–5.1)
POTASSIUM: 4.6 mmol/L (ref 3.5–5.1)
Sodium: 140 mmol/L (ref 135–145)
Sodium: 144 mmol/L (ref 135–145)
Total Bilirubin: 0.8 mg/dL (ref 0.3–1.2)
Total Protein: 8.5 g/dL — ABNORMAL HIGH (ref 6.5–8.1)
Total Protein: 5.5 g/dL — ABNORMAL LOW (ref 6.5–8.1)

## 2015-02-17 LAB — ABO/RH: ABO/RH(D): A POS

## 2015-02-17 LAB — LACTIC ACID, PLASMA: LACTIC ACID, VENOUS: 2.4 mmol/L — AB (ref 0.5–2.0)

## 2015-02-17 LAB — BASIC METABOLIC PANEL
Anion gap: 18 — ABNORMAL HIGH (ref 5–15)
BUN: 56 mg/dL — ABNORMAL HIGH (ref 6–20)
CALCIUM: 8.7 mg/dL — AB (ref 8.9–10.3)
CHLORIDE: 102 mmol/L (ref 101–111)
CO2: 20 mmol/L — AB (ref 22–32)
Creatinine, Ser: 5.97 mg/dL — ABNORMAL HIGH (ref 0.44–1.00)
GFR, EST AFRICAN AMERICAN: 8 mL/min — AB (ref >60)
GFR, EST NON AFRICAN AMERICAN: 7 mL/min — AB (ref >60)
GLUCOSE: 94 mg/dL (ref 65–99)
POTASSIUM: 4.5 mmol/L (ref 3.5–5.1)
SODIUM: 140 mmol/L (ref 135–145)

## 2015-02-17 LAB — CLOSTRIDIUM DIFFICILE BY PCR: CDIFFPCR: NEGATIVE

## 2015-02-17 LAB — C DIFFICILE QUICK SCREEN W PCR REFLEX
C DIFFICLE (CDIFF) ANTIGEN: POSITIVE — AB
C Diff toxin: NEGATIVE

## 2015-02-17 LAB — PHOSPHORUS: Phosphorus: 3.9 mg/dL (ref 2.5–4.6)

## 2015-02-17 LAB — GLUCOSE, CAPILLARY: GLUCOSE-CAPILLARY: 94 mg/dL (ref 65–99)

## 2015-02-17 LAB — APTT: aPTT: 30 seconds (ref 24–36)

## 2015-02-17 LAB — MRSA PCR SCREENING: MRSA BY PCR: NEGATIVE

## 2015-02-17 MED ORDER — HEPARIN SODIUM (PORCINE) 5000 UNIT/ML IJ SOLN
5000.0000 [IU] | Freq: Three times a day (TID) | INTRAMUSCULAR | Status: DC
  Administered 2015-02-17 – 2015-02-18 (×4): 5000 [IU] via SUBCUTANEOUS
  Filled 2015-02-17 (×4): qty 1

## 2015-02-17 MED ORDER — SODIUM CHLORIDE 0.9 % IV BOLUS (SEPSIS)
1000.0000 mL | Freq: Once | INTRAVENOUS | Status: AC
  Administered 2015-02-17: 1000 mL via INTRAVENOUS

## 2015-02-17 MED ORDER — SEVELAMER CARBONATE 800 MG PO TABS: 800.0000 mg | ORAL_TABLET | Freq: Three times a day (TID) | ORAL | Status: DC

## 2015-02-17 MED ORDER — ALPRAZOLAM 0.5 MG PO TABS: 0.5000 mg | ORAL_TABLET | Freq: Two times a day (BID) | ORAL | Status: DC | PRN

## 2015-02-17 MED ORDER — METRONIDAZOLE IN NACL 5-0.79 MG/ML-% IV SOLN
500.0000 mg | Freq: Three times a day (TID) | INTRAVENOUS | Status: DC
  Administered 2015-02-17: 500 mg via INTRAVENOUS
  Filled 2015-02-17 (×5): qty 100

## 2015-02-17 MED ORDER — LEVETIRACETAM 500 MG PO TABS: 1000.0000 mg | ORAL_TABLET | ORAL | Status: DC

## 2015-02-17 MED ORDER — VASOPRESSIN 20 UNIT/ML IV SOLN
0.0300 [IU]/min | INTRAVENOUS | Status: DC
  Administered 2015-02-17 – 2015-02-18 (×2): 0.03 [IU]/min via INTRAVENOUS
  Filled 2015-02-17 (×3): qty 2

## 2015-02-17 MED ORDER — PIPERACILLIN-TAZOBACTAM 3.375 G IVPB
3.3750 g | Freq: Two times a day (BID) | INTRAVENOUS | Status: DC
  Administered 2015-02-17 – 2015-02-18 (×2): 3.375 g via INTRAVENOUS
  Filled 2015-02-17 (×5): qty 50

## 2015-02-17 MED ORDER — MORPHINE SULFATE 2 MG/ML IJ SOLN: 2.0000 mg | INTRAMUSCULAR | Status: DC | PRN

## 2015-02-17 MED ORDER — BUDESONIDE-FORMOTEROL FUMARATE 160-4.5 MCG/ACT IN AERO
2.0000 | INHALATION_SPRAY | Freq: Two times a day (BID) | RESPIRATORY_TRACT | Status: DC
  Filled 2015-02-17: qty 6

## 2015-02-17 MED ORDER — SERTRALINE HCL 50 MG PO TABS: 50.0000 mg | ORAL_TABLET | Freq: Every day | ORAL | Status: DC

## 2015-02-17 MED ORDER — LORATADINE 10 MG PO TABS: 10.0000 mg | ORAL_TABLET | Freq: Every day | ORAL | Status: DC

## 2015-02-17 MED ORDER — IPRATROPIUM-ALBUTEROL 0.5-2.5 (3) MG/3ML IN SOLN
3.0000 mL | Freq: Four times a day (QID) | RESPIRATORY_TRACT | Status: DC
  Administered 2015-02-17 – 2015-02-18 (×4): 3 mL via RESPIRATORY_TRACT
  Filled 2015-02-17 (×5): qty 3

## 2015-02-17 MED ORDER — SOD FLUORIDE-POTASSIUM NITRATE 1.1-5 % DT PSTE: 1.0000 "application " | PASTE | Freq: Every day | DENTAL | Status: DC

## 2015-02-17 MED ORDER — VANCOMYCIN HCL IN DEXTROSE 1-5 GM/200ML-% IV SOLN
1000.0000 mg | Freq: Once | INTRAVENOUS | Status: AC
Start: 2015-02-17 — End: 2015-02-17
  Administered 2015-02-17: 1000 mg via INTRAVENOUS

## 2015-02-17 MED ORDER — SALINE SPRAY 0.65 % NA SOLN
1.0000 | Freq: Three times a day (TID) | NASAL | Status: DC | PRN
  Filled 2015-02-17: qty 44

## 2015-02-17 MED ORDER — PHENYLEPH-SHARK LIV OIL-MO-PET 0.25-3-14-71.9 % RE OINT
1.0000 "application " | TOPICAL_OINTMENT | Freq: Three times a day (TID) | RECTAL | Status: DC | PRN
  Filled 2015-02-17: qty 28.4

## 2015-02-17 MED ORDER — LEVOTHYROXINE SODIUM 50 MCG PO TABS: 50.0000 ug | ORAL_TABLET | Freq: Every day | ORAL | Status: DC

## 2015-02-17 MED ORDER — HYDROCORTISONE 10 MG PO TABS: 5.0000 mg | ORAL_TABLET | Freq: Every day | ORAL | Status: DC

## 2015-02-17 MED ORDER — SODIUM CHLORIDE 0.9 % IJ SOLN
10.0000 mL | Freq: Two times a day (BID) | INTRAMUSCULAR | Status: DC
  Administered 2015-02-17 (×2): 10 mL

## 2015-02-17 MED ORDER — ACETAMINOPHEN 325 MG PO TABS: 650.0000 mg | ORAL_TABLET | Freq: Four times a day (QID) | ORAL | Status: DC | PRN

## 2015-02-17 MED ORDER — MIDODRINE HCL 5 MG PO TABS: 20.0000 mg | ORAL_TABLET | Freq: Three times a day (TID) | ORAL | Status: DC

## 2015-02-17 MED ORDER — NOREPINEPHRINE BITARTRATE 1 MG/ML IV SOLN
0.0000 ug/min | INTRAVENOUS | Status: DC
  Administered 2015-02-17: 40 ug/min via INTRAVENOUS
  Administered 2015-02-17: 60 ug/min via INTRAVENOUS
  Administered 2015-02-18: 80 ug/min via INTRAVENOUS
  Filled 2015-02-17 (×5): qty 16

## 2015-02-17 MED ORDER — ZOLPIDEM TARTRATE 5 MG PO TABS: 5.0000 mg | ORAL_TABLET | Freq: Every evening | ORAL | Status: DC | PRN

## 2015-02-17 MED ORDER — HYDROCORTISONE ACETATE 25 MG RE SUPP: 25.0000 mg | Freq: Two times a day (BID) | RECTAL | Status: DC

## 2015-02-17 MED ORDER — PSEUDOEPHEDRINE HCL 30 MG PO TABS
30.0000 mg | ORAL_TABLET | Freq: Every day | ORAL | Status: DC
  Filled 2015-02-17: qty 1

## 2015-02-17 MED ORDER — ACETAMINOPHEN 650 MG RE SUPP: 650.0000 mg | Freq: Four times a day (QID) | RECTAL | Status: DC | PRN

## 2015-02-17 MED ORDER — CALCIUM ACETATE (PHOS BINDER) 667 MG PO CAPS: 2001.0000 mg | ORAL_CAPSULE | Freq: Three times a day (TID) | ORAL | Status: DC

## 2015-02-17 MED ORDER — SODIUM CHLORIDE 0.9 % IV SOLN
INTRAVENOUS | Status: DC
  Administered 2015-02-17: 04:00:00 via INTRAVENOUS

## 2015-02-17 MED ORDER — HYDROCORTISONE NA SUCCINATE PF 100 MG IJ SOLR
50.0000 mg | Freq: Four times a day (QID) | INTRAMUSCULAR | Status: DC
  Administered 2015-02-17 – 2015-02-18 (×4): 50 mg via INTRAVENOUS
  Filled 2015-02-17 (×6): qty 2

## 2015-02-17 MED ORDER — NOREPINEPHRINE 4 MG/250ML-% IV SOLN
0.0000 ug/min | INTRAVENOUS | Status: DC
  Administered 2015-02-17: 60 ug/min via INTRAVENOUS
  Administered 2015-02-17: 5 ug/min via INTRAVENOUS
  Administered 2015-02-17: 40 ug/min via INTRAVENOUS
  Filled 2015-02-17 (×7): qty 250

## 2015-02-17 MED ORDER — SERTRALINE HCL 100 MG PO TABS: 100.0000 mg | ORAL_TABLET | Freq: Every day | ORAL | Status: DC

## 2015-02-17 MED ORDER — ONDANSETRON HCL 4 MG/2ML IJ SOLN: 4.0000 mg | Freq: Four times a day (QID) | INTRAMUSCULAR | Status: DC | PRN

## 2015-02-17 MED ORDER — GABAPENTIN 100 MG PO CAPS: 100.0000 mg | ORAL_CAPSULE | Freq: Two times a day (BID) | ORAL | Status: DC

## 2015-02-17 MED ORDER — VANCOMYCIN HCL IN DEXTROSE 1-5 GM/200ML-% IV SOLN
INTRAVENOUS | Status: AC
  Administered 2015-02-17: 1000 mg via INTRAVENOUS
  Filled 2015-02-17: qty 200

## 2015-02-17 MED ORDER — CYCLOSPORINE 0.05 % OP EMUL
1.0000 [drp] | Freq: Two times a day (BID) | OPHTHALMIC | Status: DC
  Filled 2015-02-17 (×2): qty 1

## 2015-02-17 MED ORDER — CHLORHEXIDINE GLUCONATE 0.12% ORAL RINSE (MEDLINE KIT)
15.0000 mL | Freq: Two times a day (BID) | OROMUCOSAL | Status: DC
  Administered 2015-02-17: 15 mL via OROMUCOSAL

## 2015-02-17 MED ORDER — SODIUM CHLORIDE 0.9 % IJ SOLN
3.0000 mL | Freq: Two times a day (BID) | INTRAMUSCULAR | Status: DC
  Administered 2015-02-17 – 2015-02-18 (×2): 3 mL via INTRAVENOUS

## 2015-02-17 MED ORDER — HYDROCORTISONE 10 MG PO TABS: 10.0000 mg | ORAL_TABLET | Freq: Two times a day (BID) | ORAL | Status: DC

## 2015-02-17 MED ORDER — BUDESONIDE 0.5 MG/2ML IN SUSP
0.5000 mg | Freq: Two times a day (BID) | RESPIRATORY_TRACT | Status: DC
  Administered 2015-02-17 – 2015-02-18 (×2): 0.5 mg via RESPIRATORY_TRACT
  Filled 2015-02-17 (×2): qty 2

## 2015-02-17 MED ORDER — SODIUM CHLORIDE 0.9 % IV SOLN
500.0000 mg | Freq: Two times a day (BID) | INTRAVENOUS | Status: DC
  Administered 2015-02-17 – 2015-02-18 (×2): 500 mg via INTRAVENOUS
  Filled 2015-02-17 (×5): qty 5

## 2015-02-17 MED ORDER — RISPERIDONE 0.5 MG PO TABS: 0.5000 mg | ORAL_TABLET | Freq: Every day | ORAL | Status: DC

## 2015-02-17 MED ORDER — ANTISEPTIC ORAL RINSE SOLUTION (CORINZ)
7.0000 mL | Freq: Four times a day (QID) | OROMUCOSAL | Status: DC
  Administered 2015-02-18 (×2): 7 mL via OROMUCOSAL
  Filled 2015-02-17 (×6): qty 7

## 2015-02-17 MED ORDER — PIPERACILLIN-TAZOBACTAM 3.375 G IVPB
3.3750 g | Freq: Once | INTRAVENOUS | Status: AC
  Administered 2015-02-17: 3.375 g via INTRAVENOUS
  Filled 2015-02-17: qty 50

## 2015-02-17 MED ORDER — DEXTROSE 5 % IV SOLN
0.0000 ug/min | INTRAVENOUS | Status: DC
  Filled 2015-02-17: qty 4

## 2015-02-17 MED ORDER — DOPAMINE-DEXTROSE 3.2-5 MG/ML-% IV SOLN
0.0000 ug/kg/min | INTRAVENOUS | Status: DC
  Administered 2015-02-17 – 2015-02-18 (×2): 5 ug/kg/min via INTRAVENOUS
  Filled 2015-02-17: qty 250

## 2015-02-17 MED ORDER — GUAIFENESIN ER 600 MG PO TB12: 600.0000 mg | ORAL_TABLET | Freq: Two times a day (BID) | ORAL | Status: DC

## 2015-02-17 MED ORDER — ONDANSETRON HCL 4 MG PO TABS: 4.0000 mg | ORAL_TABLET | Freq: Four times a day (QID) | ORAL | Status: DC | PRN

## 2015-02-17 MED ORDER — LEVETIRACETAM 750 MG PO TABS
750.0000 mg | ORAL_TABLET | ORAL | Status: DC
  Filled 2015-02-17: qty 1

## 2015-02-17 MED ORDER — CALCITRIOL 0.25 MCG PO CAPS: 0.2500 ug | ORAL_CAPSULE | ORAL | Status: DC

## 2015-02-17 MED ORDER — LAMOTRIGINE 25 MG PO TABS: 50.0000 mg | ORAL_TABLET | Freq: Every day | ORAL | Status: DC

## 2015-02-17 MED ORDER — TOPIRAMATE 100 MG PO TABS
200.0000 mg | ORAL_TABLET | Freq: Two times a day (BID) | ORAL | Status: DC
  Filled 2015-02-17 (×2): qty 2

## 2015-02-17 MED ORDER — STERILE WATER FOR INJECTION IV SOLN
INTRAVENOUS | Status: DC
  Administered 2015-02-17 – 2015-02-18 (×2): via INTRAVENOUS
  Filled 2015-02-17 (×8): qty 850

## 2015-02-17 MED ORDER — SODIUM CHLORIDE 0.9 % IJ SOLN
10.0000 mL | INTRAMUSCULAR | Status: DC | PRN
  Administered 2015-02-17: 10 mL
  Filled 2015-02-17: qty 40

## 2015-02-17 MED FILL — Medication: Qty: 3 | Status: AC

## 2015-02-17 NOTE — Progress Notes (Signed)
ANTIBIOTIC CONSULT NOTE - INITIAL  Pharmacy Consult for Zosyn Indication: rule out sepsis  Allergies  Allergen Reactions  . Iodinated Diagnostic Agents Hives  . Iodine     Other reaction(s): Other (See Comments) Other Reaction: contrast-itching and flushing  . Methylprednisolone Hives  . Other Itching    Other reaction(s): Other (See Comments) Uncoded Allergy. Allergen: iv contrast dye, Other Reaction: flushing Arthritis medications Other reaction(s): Other (See Comments) Uncoded Allergy. Allergen: iv contrast dye, Other Reaction: flushing Arthritis medications  . Pollen Extract Hives  . Trimethadione     Other reaction(s): Other (See Comments) Other Reaction: Other reaction  . Trazodone And Nefazodone Hives and Rash    Patient Measurements: Height: 5\' 7"  (170.2 cm) Weight: 230 lb (104.327 kg) IBW/kg (Calculated) : 61.6   Vital Signs: Temp: 98.6 F (37 C) (08/04 0021) Temp Source: Oral (08/04 0021) BP: 65/33 mmHg (08/04 0302) Pulse Rate: 60 (08/04 0302) Intake/Output from previous day:   Intake/Output from this shift:    Labs:  Recent Labs  02/14/2015 0033  WBC 7.4  HGB 11.7*  PLT 134*  CREATININE 5.56*   Estimated Creatinine Clearance: 14 mL/min (by C-G formula based on Cr of 5.56). No results for input(s): VANCOTROUGH, VANCOPEAK, VANCORANDOM, GENTTROUGH, GENTPEAK, GENTRANDOM, TOBRATROUGH, TOBRAPEAK, TOBRARND, AMIKACINPEAK, AMIKACINTROU, AMIKACIN in the last 72 hours.   Microbiology: No results found for this or any previous visit (from the past 720 hour(s)).  Medical History: Past Medical History  Diagnosis Date  . Renal insufficiency   . Mental retardation   . Sleep apnea   . End-stage renal disease on hemodialysis   . Tuberous sclerosis   . Obesity   . GERD (gastroesophageal reflux disease)   . Depression   . Mental retardation   . Sleep apnea     on c-pap  . Hx MRSA infection   . Obesity   . Complication of anesthesia     "sometimes I  don't wake up on time; depends on how much med"  . Asthma   . 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 Osteoarthritis   . Angina   . CHF (congestive heart failure)   . Shortness of breath     "all the time"  . Pneumonia ? 2006  . Renal failure   . ESRD (end stage renal disease) on dialysis 05/25/11    Tu, Th, Sa  . Blood transfusion   . Anemia   . Stomach ulcer   . Constipated   . Seizures     last seizure 05/21/11  . Pacemaker     AV paced  . Hypertension     Medications:  Scheduled:   Infusions:  . metronidazole 500 mg (03/09/2015 0312)  . piperacillin-tazobactam (ZOSYN)  IV     PRN:   Assessment: 59 y/o F with ESRD admitted with possible C. Diff and sepsis. Patient is ordered metronidazole and Zosyn.   Plan:  Zosyn 3.375 g iv once in ED then 3.375 g EI q 12 hours.   Ulice Dash D 03/04/2015,3:45 AM

## 2015-02-17 NOTE — ED Notes (Addendum)
Spoke with Beverlyn Roux, RN from [facility], RN reports patient had diarrhea episodes and pateint had reported she could not make it to the toilet, RN states stool color was "reddish." RN states patient has history of hypotension, normal blood pressure run in the 80/40. Patient is a Tuesday, Thursday, and Saturday dialysis patient. Patient had extra dialysis on Wednesday done. RN reports patient is noncompliant with diet, RN states "she does what she wants."

## 2015-02-17 NOTE — ED Provider Notes (Signed)
[place]  Department of Emergency Medicine   Code Blue CONSULT NOTE  Chief Complaint: Cardiac arrest/unresponsive   Level V Caveat: Unresponsive   ROS: Unable to obtain, Level V caveat  Scheduled Meds: . budesonide-formoterol  2 puff Inhalation BID  . calcitRIOL  0.25 mcg Oral Q T,Th,Sa-HD  . calcium acetate  2,001 mg Oral TID WC  . cycloSPORINE  1 drop Both Eyes BID  . gabapentin  100 mg Oral BID  . guaiFENesin  600 mg Oral BID  . heparin  5,000 Units Subcutaneous 3 times per day  . hydrocortisone  25 mg Rectal BID  . hydrocortisone  10 mg Oral BID  . hydrocortisone  5 mg Oral QHS  . ipratropium-albuterol  3 mL Nebulization Q6H  . lamoTRIgine  50 mg Oral QHS  . [START ON March 11, 2015] levETIRAcetam  1,000 mg Oral Q M,W,F,Su-1800  . levETIRAcetam  750 mg Oral Q T,Th,Sat-1800  . levothyroxine  50 mcg Oral QAC breakfast  . loratadine  10 mg Oral Daily  . metronidazole  500 mg Intravenous Q8H  . midodrine  20 mg Oral TID PC  . piperacillin-tazobactam (ZOSYN)  IV  3.375 g Intravenous Q12H  . pseudoephedrine  30 mg Oral Daily  . risperiDONE  0.5 mg Oral QHS  . sertraline  100 mg Oral Daily  . sertraline  50 mg Oral Daily  . sevelamer carbonate  800 mg Oral TID WC  . Sod Fluoride-Potassium Nitrate  1 application dental QHS  . sodium chloride  3 mL Intravenous Q12H  . topiramate  200 mg Oral BID   Continuous Infusions: . sodium chloride 100 mL/hr at 03/03/2015 0349  . norepinephrine 5 mcg/min (03/10/2015 0653)  .  sodium bicarbonate 150 mEq in sterile water 1000 mL infusion    . vasopressin (PITRESSIN) infusion - *FOR SHOCK*     PRN Meds:.acetaminophen **OR** acetaminophen, ALPRAZolam, morphine injection, ondansetron **OR** ondansetron (ZOFRAN) IV, phenylephrine-shark liver oil-mineral oil-petrolatum, sodium chloride, zolpidem Past Medical History  Diagnosis Date  . Renal insufficiency   . Mental retardation   . Sleep apnea   . End-stage renal disease on  hemodialysis   . Tuberous sclerosis   . Obesity   . GERD (gastroesophageal reflux disease)   . Depression   . Mental retardation   . Sleep apnea     on c-pap  . Hx MRSA infection   . Obesity   . Complication of anesthesia     "sometimes I don't wake up on time; depends on how much med"  . Asthma   . 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 Osteoarthritis   . Angina   . CHF (congestive heart failure)   . Shortness of breath     "all the time"  . Pneumonia ? 2006  . Renal failure   . ESRD (end stage renal disease) on dialysis 05/25/11    Tu, Th, Sa  . Blood transfusion   . Anemia   . Stomach ulcer   . Constipated   . Seizures     last seizure 05/21/11  . Pacemaker     AV paced  . Hypertension    Past Surgical History  Procedure Laterality Date  . Uterine tumor  05/25/11    pt denies this history  . Nephrectomy  01/08/1994; 01/15/2006    left; right  . Shunt in arm for dialysis      right   . Thrombectomy  05/2010    shunt in my right arm  .  Cholecystectomy  07/02/1990  . Tonsillectomy  03/03/1978  . Avgg removal  06-26-11    Right arm AVG removed.  . Pacemaker placement    . Vas ins tunn cv cath 5 or older ([facility] hx)      both legs  . Av fistula placement      L  . Arteriovenous graft placement      R arm  . Peripheral vascular catheterization Left 11/19/2014    Procedure: Thrombectomy;  Surgeon: Katha Cabal, MD;  Location: [facility];  Service: Cardiovascular;  Laterality: Left;  . Peripheral vascular catheterization Right 11/19/2014    Procedure: A/V Shuntogram/Fistulagram;  Surgeon: Katha Cabal, MD;  Location: [facility];  Service: Cardiovascular;  Laterality: Right;  . Peripheral vascular catheterization Left 11/17/2014    Procedure: Dialysis/Perma Catheter Insertion;  Surgeon: Katha Cabal, MD;  Location: [facility];  Service: Cardiovascular;  Laterality: Left;   History   Social History  . Marital Status: Single    Spouse Name: N/A  .  Number of Children: 0  . Years of Education: 10   Occupational History  . lives in group home    Social History Main Topics  . Smoking status: Never Smoker   . Smokeless tobacco: Never Used  . Alcohol Use: No  . Drug Use: No  . Sexual Activity: No   Other Topics Concern  . Not on file   Social History Narrative   Patient stays at [facility].    Allergies  Allergen Reactions  . Iodinated Diagnostic Agents Hives  . Iodine     Other reaction(s): Other (See Comments) Other Reaction: contrast-itching and flushing  . Methylprednisolone Hives  . Other Itching    Other reaction(s): Other (See Comments) Uncoded Allergy. Allergen: iv contrast dye, Other Reaction: flushing Arthritis medications Other reaction(s): Other (See Comments) Uncoded Allergy. Allergen: iv contrast dye, Other Reaction: flushing Arthritis medications  . Pollen Extract Hives  . Trimethadione     Other reaction(s): Other (See Comments) Other Reaction: Other reaction  . Trazodone And Nefazodone Hives and Rash    Last set of Vital Signs (not current) Filed Vitals:   02/16/2015 0620  BP: 127/88  Pulse: 64  Temp:   Resp: 31      Physical Exam  Gen: unresponsive Cardiovascular: pulseless  Resp: apneic. Breath sounds equal bilaterally with bagging  Abd: nondistended  Neuro: GCS 3, unresponsive to pain  HEENT: No blood in posterior pharynx, gag reflex absent  Neck: No crepitus  Musculoskeletal: No deformity  Skin: warm    CRITICAL CARE Performed by: Daymon Larsen Total critical care time: 12 minutes Critical care time was exclusive of separately billable procedures and treating other patients. Critical care was necessary to treat or prevent imminent or life-threatening deterioration. Critical care was time spent personally by me on the following activities: development of treatment plan with patient and/or surrogate as well as nursing, discussions with consultants, evaluation of  patient's response to treatment, examination of patient, obtaining history from patient or surrogate, ordering and performing treatments and interventions, ordering and review of laboratory studies, ordering and review of radiographic studies, pulse oximetry and re-evaluation of patient's condition.  Cardiopulmonary Resuscitation (CPR) Procedure Note  Directed/Performed by: Daymon Larsen I personally directed ancillary staff and/or performed CPR in an effort to regain return of spontaneous circulation and to maintain cardiac, neuro and systemic perfusion.    Medical Decision making  Called to bedside  for CODE BLUE patient was seen immediately and was in a paced rhythm with hypotension and was receiving compressions. Patient is a dialysis patient and had not had any morning orders for recent dialysis. Patient received a amp of epinephrine along with 1 amp of bicarbonate and 1 amp of calcium. Patient had continued CPR. Patient was intubated orotracheally single pass. The patient had good visualization of her vocal cords and has 7 a half endotracheal tube inserted with stylette without complication and positive CO2 detection. She was continued on ventilations the respirator not arrived at this point. Care of the patient was turned over to the intensive care unit staff who arrived shortly after initiating code.  INTUBATION Performed by: Daymon Larsen  Required items: required blood products, implants, devices, and special equipment available Patient identity confirmed: provided demographic data and hospital-assigned identification number Time out: Immediately prior to procedure a "time out" was called to verify the correct patient, procedure, equipment, support staff and site/side marked as required.  Indications: CODE BLUE   Intubation method: Manual   Preoxygenation: BVM       Daymon Larsen, MD 02/14/2015 0900

## 2015-02-17 NOTE — Progress Notes (Signed)
With initial assessment, patient pupils unequal: right pupil 4 and unresponsive, left pupil 2, sluggish. Called and updated Dr. Verdell Carmine of todays events, code x 2, 3 vasopressors. No new orders received.

## 2015-02-17 NOTE — Progress Notes (Signed)
Status: 82yrs female, Diarrhea and history of Asthma, unresponsive this AM Family: None present Visit Assessment: The chaplain responded to 2 codes this AM and lifted up silent prayers from the doorway for her healing and comfort and responsiveness.   Pastoral Care: [phone] pager or by submitting an online request

## 2015-02-17 NOTE — Consult Note (Signed)
[facility] Consult Note  MRN : [id number]  [name] is a 59 y.o. (Jun 08, 1956) female who presents with chief complaint of  Chief Complaint  Patient presents with  . Abdominal Pain  . GI Bleeding  .  History of Present Illness: Patient with an extensive past medical history admitted early this morning hypotensive with unresponsiveness. She is now in the critical care unit intubated and on multiple pressors with pressures in the 50s and 60s. She has chronic hypotension and has had a difficult situation with her end-stage renal disease. She has required multiple previous interventions and thrombectomies to her right thigh AV graft after multiple failed upper extremity accesses. She had a cardiac arrest and has had CPR and is now on multiple pressors intubated in the critical care unit. Her graft is occluded. She needs a venous access for dialysis access as well as likely a catheter for dialysis for continued aggressive care. She can provide no history because of her medical situation.  Current Facility-Administered Medications  Medication Dose Route Frequency Provider Last Rate Last Dose  . 0.9 %  sodium chloride infusion   Intravenous Continuous Juluis Mire, MD 100 mL/hr at 02/15/2015 0349    . acetaminophen (TYLENOL) tablet 650 mg  650 mg Oral Q6H PRN Juluis Mire, MD       Or  . acetaminophen (TYLENOL) suppository 650 mg  650 mg Rectal Q6H PRN Juluis Mire, MD      . ALPRAZolam Duanne Moron) tablet 0.5 mg  0.5 mg Oral BID PRN Juluis Mire, MD      . budesonide (PULMICORT) nebulizer solution 0.5 mg  0.5 mg Nebulization BID Flora Lipps, MD      . heparin injection 5,000 Units  5,000 Units Subcutaneous 3 times per day Juluis Mire, MD   5,000 Units at 02/21/2015 0618  . hydrocortisone (ANUSOL-HC) suppository 25 mg  25 mg Rectal BID Juluis Mire, MD      . hydrocortisone sodium succinate (SOLU-CORTEF) 100 MG injection 50 mg  50 mg  Intravenous Q6H Flora Lipps, MD      . ipratropium-albuterol (DUONEB) 0.5-2.5 (3) MG/3ML nebulizer solution 3 mL  3 mL Nebulization Q6H Juluis Mire, MD   3 mL at 02/16/2015 0848  . [START ON 03-18-15] levETIRAcetam (KEPPRA) tablet 1,000 mg  1,000 mg Oral Q M,W,F,Su-1800 Juluis Mire, MD      . levETIRAcetam (KEPPRA) tablet 750 mg  750 mg Oral Q T,Th,Sat-1800 Juluis Mire, MD      . morphine 2 MG/ML injection 2 mg  2 mg Intravenous Q4H PRN Juluis Mire, MD      . norepinephrine (LEVOPHED) 4mg  in D5W 218mL premix infusion  0-40 mcg/min Intravenous Titrated Juluis Mire, MD 225 mL/hr at 03/06/2015 0924 60 mcg/min at 02/28/2015 0924  . ondansetron (ZOFRAN) tablet 4 mg  4 mg Oral Q6H PRN Juluis Mire, MD       Or  . ondansetron [facility]) injection 4 mg  4 mg Intravenous Q6H PRN Juluis Mire, MD      . phenylephrine-shark liver oil-mineral oil-petrolatum (PREPARATION H) rectal ointment 1 application  1 application Rectal TID PRN Juluis Mire, MD      . piperacillin-tazobactam (ZOSYN) IVPB 3.375 g  3.375 g Intravenous Q12H Juluis Mire, MD      . sodium bicarbonate 150 mEq in sterile water 1,000 mL infusion   Intravenous Continuous Flora Lipps, MD 125  mL/hr at 02/19/2015 0937    . sodium chloride (OCEAN) 0.65 % nasal spray 1 spray  1 spray Each Nare Q8H PRN Juluis Mire, MD      . sodium chloride 0.9 % injection 3 mL  3 mL Intravenous Q12H Juluis Mire, MD      . vasopressin (PITRESSIN) 40 Units in sodium chloride 0.9 % 250 mL (0.16 Units/mL) infusion  0.03 Units/min Intravenous Continuous Flora Lipps, MD 11.3 mL/hr at 02/25/2015 0925 0.03 Units/min at 02/14/2015 0925  . zolpidem (AMBIEN) tablet 5 mg  5 mg Oral QHS PRN Juluis Mire, MD        Past Medical History  Diagnosis Date  . Renal insufficiency   . Mental retardation   . Sleep apnea   . End-stage renal disease on hemodialysis   . Tuberous sclerosis   . Obesity   . GERD (gastroesophageal reflux disease)    . Depression   . Mental retardation   . Sleep apnea     on c-pap  . Hx MRSA infection   . Obesity   . Complication of anesthesia     "sometimes I don't wake up on time; depends on how much med"  . Asthma   . 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 Osteoarthritis   . Angina   . CHF (congestive heart failure)   . Shortness of breath     "all the time"  . Pneumonia ? 2006  . Renal failure   . ESRD (end stage renal disease) on dialysis 05/25/11    Tu, Th, Sa  . Blood transfusion   . Anemia   . Stomach ulcer   . Constipated   . Seizures     last seizure 05/21/11  . Pacemaker     AV paced  . Hypertension     Past Surgical History  Procedure Laterality Date  . Uterine tumor  05/25/11    pt denies this history  . Nephrectomy  01/08/1994; 01/15/2006    left; right  . Shunt in arm for dialysis      right   . Thrombectomy  05/2010    shunt in my right arm  . Cholecystectomy  07/02/1990  . Tonsillectomy  03/03/1978  . Avgg removal  06-26-11    Right arm AVG removed.  . Pacemaker placement    . Vas ins tunn cv cath 5 or older ([facility] hx)      both legs  . Av fistula placement      L  . Arteriovenous graft placement      R arm  . Peripheral vascular catheterization Left 11/19/2014    Procedure: Thrombectomy;  Surgeon: Katha Cabal, MD;  Location: [facility];  Service: Cardiovascular;  Laterality: Left;  . Peripheral vascular catheterization Right 11/19/2014    Procedure: A/V Shuntogram/Fistulagram;  Surgeon: Katha Cabal, MD;  Location: [facility];  Service: Cardiovascular;  Laterality: Right;  . Peripheral vascular catheterization Left 11/17/2014    Procedure: Dialysis/Perma Catheter Insertion;  Surgeon: Katha Cabal, MD;  Location: [facility];  Service: Cardiovascular;  Laterality: Left;    Social History History  Substance Use Topics  . Smoking status: Never Smoker   . Smokeless tobacco: Never Used  . Alcohol Use: No  lives in a facility  Family  History Family History  Problem Relation Age of Onset  . Diabetes Father   . Asthma Son   no bleeding disorders, clotting disorders, or porphyrias  Allergies  Allergen Reactions  . Iodinated Diagnostic Agents Hives  . Iodine     Other reaction(s): Other (See Comments) Other Reaction: contrast-itching and flushing  . Methylprednisolone Hives  . Other Itching    Other reaction(s): Other (See Comments) Uncoded Allergy. Allergen: iv contrast dye, Other Reaction: flushing Arthritis medications Other reaction(s): Other (See Comments) Uncoded Allergy. Allergen: iv contrast dye, Other Reaction: flushing Arthritis medications  . Pollen Extract Hives  . Trimethadione     Other reaction(s): Other (See Comments) Other Reaction: Other reaction  . Trazodone And Nefazodone Hives and Rash     REVIEW OF SYSTEMS (Negative unless checked)  Unable to obtain ROS secondary to patient's mental status and being intubated with the severity of her illness.  Physical Examination  Filed Vitals:   03/11/2015 0409 02/16/2015 0431 03/05/2015 0500 03/12/2015 0620  BP: 59/30 55/42 54/29  127/88  Pulse: 72   64  Temp:   96.5 F (35.8 C)   TempSrc:   Axillary   Resp: 24   31  Height:      Weight:   320 lb 15.8 oz (145.6 kg)   SpO2: 99%  97% 95%   Body mass index is 50.26 kg/(m^2). Gen:  WD/WN, morbidly obese.  Critically ill appearing Head: [place]/AT, No temporalis wasting. Ear/Nose/Throat: Hearing grossly intact, nares w/o erythema or drainage, oropharynx w/o Erythema/Exudate Eyes: non-icteric, conjuntiva clear Neck: Supple, no nuchal rigidity.  No bruit or JVD.  Pulmonary:  Coarse BS bilaterally on the ventilator Cardiac: RRR, paced, normal S1, S2, no Murmurs, rubs or gallops. Vascular: right thigh AVG with no thrill present Vessel Right Left  Radial Palpable Palpable  Ulnar Palpable Palpable  Brachial Palpable Palpable  Carotid Palpable, without bruit Palpable, without bruit  Aorta Not palpable N/A           PT Not Palpable Not Palpable  DP Not Palpable Not Palpable   Gastrointestinal: soft, non-tender/non-distended. No guarding/reflex. No masses, surgical incisions, or scars. Musculoskeletal:  Extremities without ischemic changes.  No deformity or atrophy. Moderate LE edema Neurologic: nonresponsive, not responding to pain.  Difficult to assess Psychiatric: intubated Dermatologic: No rashes or ulcers noted. neurofibromatosis       CBC Lab Results  Component Value Date   WBC 11.9* 03/10/2015   HGB 10.6* 02/15/2015   HCT 34.9* 03/12/2015   MCV 110.3* 03/16/2015   PLT 108* 03/02/2015    BMET    Component Value Date/Time   NA 140 02/19/2015 0702   NA 137 10/21/2014 0941   K 4.5 02/21/2015 0702   K 4.0 10/21/2014 0941   CL 102 03/01/2015 0702   CL 96* 10/21/2014 0941   CO2 20* 03/04/2015 0702   CO2 28 10/21/2014 0941   GLUCOSE 94 02/16/2015 0702   GLUCOSE 121* 10/21/2014 0941   BUN 56* 02/14/2015 0702   BUN 61* 10/21/2014 0941   CREATININE 5.97* 02/15/2015 0702   CREATININE 6.42* 10/21/2014 0941   CALCIUM 8.7* 03/01/2015 0702   CALCIUM 8.7* 10/21/2014 0941   CALCIUM 10.2 02/09/2010 1028   GFRNONAA 7* 03/13/2015 0702   GFRNONAA 7* 10/21/2014 0941   GFRAA 8* 02/16/2015 0702   GFRAA 8* 10/21/2014 0941   Estimated Creatinine Clearance: 15.8 mL/min (by C-G formula based on Cr of 5.97).  COAG Lab Results  Component Value Date   INR 1.11 09/08/2014   INR 1.1 05/22/2014   INR 1.0 05/20/2014     Assessment/Plan 1. Hypotension requiring pressors with poor venous access. We'll place a line provide access  for both dialysis as well as venous access. 2. End-stage renal disease. Would likely need dialysis if she survives this episode. Graft appears occluded. 3. Dysfunction of dialysis access with apparent thrombosis. Not surprising giving hypotension. We'll place a triple-lumen dialysis catheter for both venous access and dialysis access. 4. Morbid obesity. Likely  complicating her access situation as the pulmonary service apparently tried to place a line without success.  This is a very difficult and morbid situation and I would not suspect the patient will survive. She has been very sick before, but at this point is nonresponsive and profoundly hypotensive on multiple pressors.   DEW,JASON, MD  02/24/2015 11:03 AM

## 2015-02-17 NOTE — Progress Notes (Signed)
    1540  Clinical Encounter Type  Visited With Patient and family together  Visit Type Follow-up  Consult/Referral To Chaplain  Spiritual Encounters  Spiritual Needs Emotional  Stress Factors  Patient Stress Factors None identified  Family Stress Factors Health changes  Chaplain rounded in unit and offered a compassionate presence and support to the family as applicable. Chaplain Kiwan Gadsden A. Gay Rape Ext. [phone]

## 2015-02-17 NOTE — Progress Notes (Signed)
03/06/15 1315  Clinical Encounter Type  Visited With Family  Visit Type Initial;Follow-up;Spiritual support  Consult/Referral To Chaplain  Spiritual Encounters  Spiritual Needs Emotional  Stress Factors  Family Stress Factors None identified  Chaplain rounded in unit and offered a compassionnate presence and support to family as applicable. Chaplain Bartolo Montanye A. Natosha Bou Ext. [phone]

## 2015-02-17 NOTE — Progress Notes (Signed)
[facility]  ROUNDING NOTE   Subjective:   Patient was admitted with diarrhea and abdominal pain. Did not go for dialysis on Tuesday. Started to be short of breath, low blood pressure. Lower than baseline blood pressure which is 60-80 SBP and 40s DBP. Was lethargic. Started on norepinephrine.   Patient went into cardiorespiratory arrest and required CODE BLUE x 2. Now placed on norepinephrine through peripheral IV access.  Not currently on sedation  Objective:  Vital signs in last 24 hours:  Temp:  [96.5 F (35.8 C)-98.6 F (37 C)] 96.5 F (35.8 C) (08/04 0500) Pulse Rate:  [60-105] 64 (08/04 0620) Resp:  [18-31] 31 (08/04 0620) BP: (54-127)/(25-88) 127/88 mmHg (08/04 0620) SpO2:  [93 %-100 %] 95 % (08/04 0620) Weight:  [104.327 kg (230 lb)-145.6 kg (320 lb 15.8 oz)] 145.6 kg (320 lb 15.8 oz) (08/04 0500)  Weight change:  Filed Weights   03/06/2015 0021 02/24/2015 0500  Weight: 104.327 kg (230 lb) 145.6 kg (320 lb 15.8 oz)    Intake/Output:     Intake/Output this shift:     Physical Exam: General: Critically ill  Head: +ETT  Eyes: Eyes closed  Neck: Right peripheral shoulder IV  Lungs:  PRVC FiO2 100%   Heart: Regular rate and rhythm  Abdomen:  Obese, midline wound healing  Extremities: no peripheral edema.  Neurologic: Off sedation  Skin: Midline abdominal incisional wound  Access: Left AVG with no bruit nor thrill    Basic Metabolic Panel:  Recent Labs Lab 03/01/2015 0033 03/06/2015 0702  NA 140 140  K 4.6 4.5  CL 95* 102  CO2 29 20*  GLUCOSE 129* 94  BUN 57* 56*  CREATININE 5.56* 5.97*  CALCIUM 9.6 8.7*    Liver Function Tests:  Recent Labs Lab 03/06/2015 0033  AST 29  ALT 14  ALKPHOS 296*  BILITOT 1.0  PROT 8.5*  ALBUMIN 4.2   No results for input(s): LIPASE, AMYLASE in the last 168 hours. No results for input(s): AMMONIA in the last 168 hours.  CBC:  Recent Labs Lab 02/22/2015 0033 02/14/2015 0702  WBC 7.4 11.9*  HGB  11.7* 10.6*  HCT 36.7 34.9*  MCV 106.4* 110.3*  PLT 134* 108*    Cardiac Enzymes: No results for input(s): CKTOTAL, CKMB, CKMBINDEX, TROPONINI in the last 168 hours.  BNP: Invalid input(s): POCBNP  CBG: No results for input(s): GLUCAP in the last 168 hours.  Microbiology: Results for orders placed or performed during the hospital encounter of 02/25/2015  C difficile quick scan w PCR reflex     Status: Abnormal   Collection Time: 02/16/2015  1:53 AM  Result Value Ref Range Status   C Diff antigen POSITIVE (A) NEGATIVE Final   C Diff toxin NEGATIVE NEGATIVE Final   C Diff interpretation   Final    Negative for toxigenic C. difficile. Toxin gene and active toxin production not detected. May be a nontoxigenic strain of C. difficile bacteria present, lacking the ability to produce toxin.  Clostridium Difficile by PCR     Status: None   Collection Time: 03/13/2015  1:53 AM  Result Value Ref Range Status   Toxigenic C Difficile by pcr NEGATIVE NEGATIVE Final  MRSA PCR Screening     Status: None   Collection Time: 03/04/2015  5:20 AM  Result Value Ref Range Status   MRSA by PCR NEGATIVE NEGATIVE Final    Comment:        The GeneXpert MRSA Assay (FDA approved  for NASAL specimens only), is one component of a comprehensive MRSA colonization surveillance program. It is not intended to diagnose MRSA infection nor to guide or monitor treatment for MRSA infections.     Coagulation Studies: No results for input(s): LABPROT, INR in the last 72 hours.  Urinalysis: No results for input(s): COLORURINE, LABSPEC, PHURINE, GLUCOSEU, HGBUR, BILIRUBINUR, KETONESUR, PROTEINUR, UROBILINOGEN, NITRITE, LEUKOCYTESUR in the last 72 hours.  Invalid input(s): APPERANCEUR    Imaging: Dg Chest 1 View  Mar 11, 2015   CLINICAL DATA:  Diarrhea.  Incontinence.  Dyspnea.  EXAM: CHEST  1 VIEW  COMPARISON:  12/16/2014  FINDINGS: There is moderate unchanged cardiomegaly. No large effusion. No focal airspace  consolidation. Pulmonary vasculature is normal.  IMPRESSION: Cardiomegaly.  No acute findings.   Electronically Signed   By: Ellery Plunk M.D.   On: 11-Mar-2015 02:55     Medications:   . sodium chloride 100 mL/hr at 11-Mar-2015 0349  . norepinephrine 5 mcg/min (2015/03/11 0653)  .  sodium bicarbonate 150 mEq in sterile water 1000 mL infusion     . budesonide-formoterol  2 puff Inhalation BID  . calcitRIOL  0.25 mcg Oral Q T,Th,Sa-HD  . calcium acetate  2,001 mg Oral TID WC  . cycloSPORINE  1 drop Both Eyes BID  . gabapentin  100 mg Oral BID  . guaiFENesin  600 mg Oral BID  . heparin  5,000 Units Subcutaneous 3 times per day  . hydrocortisone  25 mg Rectal BID  . hydrocortisone  10 mg Oral BID  . hydrocortisone  5 mg Oral QHS  . ipratropium-albuterol  3 mL Nebulization Q6H  . lamoTRIgine  50 mg Oral QHS  . [START ON 02/24/2015] levETIRAcetam  1,000 mg Oral Q M,W,F,Su-1800  . levETIRAcetam  750 mg Oral Q T,Th,Sat-1800  . levothyroxine  50 mcg Oral QAC breakfast  . loratadine  10 mg Oral Daily  . metronidazole  500 mg Intravenous Q8H  . midodrine  20 mg Oral TID PC  . piperacillin-tazobactam (ZOSYN)  IV  3.375 g Intravenous Q12H  . pseudoephedrine  30 mg Oral Daily  . risperiDONE  0.5 mg Oral QHS  . sertraline  100 mg Oral Daily  . sertraline  50 mg Oral Daily  . sevelamer carbonate  800 mg Oral TID WC  . Sod Fluoride-Potassium Nitrate  1 application dental QHS  . sodium chloride  3 mL Intravenous Q12H  . topiramate  200 mg Oral BID   acetaminophen **OR** acetaminophen, ALPRAZolam, morphine injection, ondansetron **OR** ondansetron (ZOFRAN) IV, phenylephrine-shark liver oil-mineral oil-petrolatum, sodium chloride, zolpidem  Assessment/ Plan:  Ms. Abram is a 59 y.o. white female with ESRD on hemodialysis TTS, bilateral nephrectomies, seizure disorder, morbid obesity, tubuerous sclerosis, hypotension, depression, asthma and secondary hyperparathyroidism, seizure disorder admit  4/19, OSA on CPAP  [facility]//[facility]// TTS  1. ESRD: missed Tuesday treatment. Potassium is at goal. Will need hemodialysis. May need another HD access.  Critically ill. Very poor prognosis.   2. Hypotension: I95.9 chronically with low BP. Now on high dose norepinephrine.  - Home PO fludrcortisone, hydrocortisone, and midodrine. - possible septic and hypovolemic shock.   3. Anemia of Kidney Disease: hemoglobin 10.6. With iron defciency anemia -  Patient getting micera as outpatient.  - monitor chronic but stable thrombocytopenia.   4. SHPTH:  - renvela and calcium acetate with meals as outpatient.  - She takes calcitriol on dialysis days.    LOS: 0 Precious Segall 08-26-168:51 AM

## 2015-02-17 NOTE — Progress Notes (Signed)
Patient b/p continues to decline, 59/49 (54), with 3 vasopressors. Increased Levophed to 60 mcg. Patient family (sister-in-law) at bedside, updated on situation.

## 2015-02-17 NOTE — Consult Note (Signed)
 Palliative Medicine Inpatient Consult Note   Name: [name] Date: 03/10/2015 MRN: [id number]  DOB: August 09, 1955  Referring Physician: Aldean Jewett, MD  Palliative Care consult requested for this 59 y.o. female for goals of medical therapy in patient who had cardiac arrest early this am in the ICU. She is a chronically ill 59 yr old woman who was admitted from the nursing home where she lives for abdominal pain and diarrhea.  She is felt to be septic.  She has ESRD on HD, Developmental Delay and mental retardation, sleep apnea, obesity, Depression, asthma, Seizures, DJD, history of CHF (type not clear), a history of arrhythmias now with a pacemaker, h/o PUD, constipation (normally), HTN, PVD, and history of numerous infections   TODAY'S CONVERSATIONS AND INTERVENTIONS AND PLAN: 1)I met family briefly and was able to obtain POA/HCPOA/ and LIVING WILL documents.  Father is HCPOA and stepmother is secondary on this list.  BUT, patient's brother actually makes the decisions and his parents readily defer to whatever his wishes may be. At first, they told me that her forms say what she wants done and not done --but we clarified that we still have to ask family.      2) Dr Mortimer Fries joined me in the meeting with the family and after much discussion, her brother agreed with DNR at this point, as it is readily obvious that she could not tolerate another round of resuscitation.  Family defers to patient's brother in these matters.    3) Patient's brother said he did not want medications stopped and did not want [place] to take her off the ventilator.  Patient's mother said that a few weeks ago, patient told her that she wanted to die and was ready to go to heaven.  Parents seem much less inclined to continue aggressive care at this point, but they defer to patient's brother who says that she has come through tough situations before and he wants to 'give her a fighting chance'.  She will remain on 3 pressors  with a 4th likely to be added (her SBP is only 47).  She will continue on the vent.  She will not have CPR.  4) It is likely that patient will die slowly over the next day or so.  She does have a pacemaker and she doesn't have good pulses at baseline, so it is going to be difficult to tell when / if her heart stops working totally.  We will have to focus on blood pressure readings and respiratory status for now.  Recovery is highly unlikely in this setting.  She is not comfort care, but there is little else we can do.  And she cannot be dyalized in this unstable state, so toxins and potassium will build up.    REVIEW OF SYSTEMS:  Patient is not able to provide ROS --unconscious post code.  SUPPORT SYSTEM: Yes.  Father, step-mother, and brother are supportive and here.   SOCIAL HISTORY:  reports that she has never smoked. She has never used smokeless tobacco. She reports that she does not drink alcohol or use illicit drugs.    LEGAL DOCUMENTS:  POA, HCPOA, and Living Will are all copied and in the chart. Pt's living will essentially states that she would not want to be maintained in futile situations.    NOTE:  Father is listed as  CODE STATUS: DNR --as of now  (changed from Full Code to DNR at this time with family's decision)  PAST MEDICAL  HISTORY: Past Medical History  Diagnosis Date  . Renal insufficiency   . Mental retardation   . Sleep apnea   . End-stage renal disease on hemodialysis   . Tuberous sclerosis   . Obesity   . GERD (gastroesophageal reflux disease)   . Depression   . Mental retardation   . Sleep apnea     on c-pap  . Hx MRSA infection   . Obesity   . Complication of anesthesia     "sometimes I don't wake up on time; depends on how much med"  . Asthma   . 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 Osteoarthritis   . Angina   . CHF (congestive heart failure)   . Shortness of breath     "all the time"  . Pneumonia ? 2006  . Renal failure   . ESRD (end stage renal disease) on  dialysis 05/25/11    Tu, Th, Sa  . Blood transfusion   . Anemia   . Stomach ulcer   . Constipated   . Seizures     last seizure 05/21/11  . Pacemaker     AV paced  . Hypertension     PAST SURGICAL HISTORY:  Past Surgical History  Procedure Laterality Date  . Uterine tumor  05/25/11    pt denies this history  . Nephrectomy  01/08/1994; 01/15/2006    left; right  . Shunt in arm for dialysis      right   . Thrombectomy  05/2010    shunt in my right arm  . Cholecystectomy  07/02/1990  . Tonsillectomy  03/03/1978  . Avgg removal  06-26-11    Right arm AVG removed.  . Pacemaker placement    . Vas ins tunn cv cath 5 or older ([facility] hx)      both legs  . Av fistula placement      L  . Arteriovenous graft placement      R arm  . Peripheral vascular catheterization Left 11/19/2014    Procedure: Thrombectomy;  Surgeon: Katha Cabal, MD;  Location: [facility];  Service: Cardiovascular;  Laterality: Left;  . Peripheral vascular catheterization Right 11/19/2014    Procedure: A/V Shuntogram/Fistulagram;  Surgeon: Katha Cabal, MD;  Location: [facility];  Service: Cardiovascular;  Laterality: Right;  . Peripheral vascular catheterization Left 11/17/2014    Procedure: Dialysis/Perma Catheter Insertion;  Surgeon: Katha Cabal, MD;  Location: [facility];  Service: Cardiovascular;  Laterality: Left;    ALLERGIES:  is allergic to iodinated diagnostic agents; iodine; methylprednisolone; other; pollen extract; trimethadione; and trazodone and nefazodone.  MEDICATIONS:  Current Facility-Administered Medications  Medication Dose Route Frequency Provider Last Rate Last Dose  . 0.9 %  sodium chloride infusion   Intravenous Continuous Juluis Mire, MD 100 mL/hr at 02/25/2015 0349    . acetaminophen (TYLENOL) tablet 650 mg  650 mg Oral Q6H PRN Juluis Mire, MD       Or  . acetaminophen (TYLENOL) suppository 650 mg  650 mg Rectal Q6H PRN Juluis Mire, MD       . ALPRAZolam Duanne Moron) tablet 0.5 mg  0.5 mg Oral BID PRN Juluis Mire, MD      . budesonide (PULMICORT) nebulizer solution 0.5 mg  0.5 mg Nebulization BID Flora Lipps, MD   0.5 mg at 03/08/2015 1109  . heparin injection 5,000 Units  5,000 Units Subcutaneous 3 times per day Juluis Mire, MD   5,000 Units  at 02/16/2015 0618  . hydrocortisone (ANUSOL-HC) suppository 25 mg  25 mg Rectal BID Juluis Mire, MD   25 mg at 03/16/2015 1000  . hydrocortisone sodium succinate (SOLU-CORTEF) 100 MG injection 50 mg  50 mg Intravenous Q6H Flora Lipps, MD      . ipratropium-albuterol (DUONEB) 0.5-2.5 (3) MG/3ML nebulizer solution 3 mL  3 mL Nebulization Q6H Juluis Mire, MD   3 mL at 02/24/2015 0848  . [START ON Mar 13, 2015] levETIRAcetam (KEPPRA) tablet 1,000 mg  1,000 mg Oral Q M,W,F,Su-1800 Juluis Mire, MD      . levETIRAcetam (KEPPRA) tablet 750 mg  750 mg Oral Q T,Th,Sat-1800 Juluis Mire, MD      . morphine 2 MG/ML injection 2 mg  2 mg Intravenous Q4H PRN Juluis Mire, MD      . norepinephrine (LEVOPHED) 6m in D5W 2549mpremix infusion  0-40 mcg/min Intravenous Titrated EdJuluis MireMD 225 mL/hr at 02/27/2015 0924 60 mcg/min at 03/01/2015 0924  . ondansetron (ZOFRAN) tablet 4 mg  4 mg Oral Q6H PRN EdJuluis MireMD       Or  . ondansetron ([facility]apt[facility] Hospital Tiptoninjection 4 mg  4 mg Intravenous Q6H PRN EdJuluis MireMD      . phenylephrine-shark liver oil-mineral oil-petrolatum (PREPARATION H) rectal ointment 1 application  1 application Rectal TID PRN EdJuluis MireMD      . piperacillin-tazobactam (ZOSYN) IVPB 3.375 g  3.375 g Intravenous Q12H EdJuluis MireMD      . sodium bicarbonate 150 mEq in sterile water 1,000 mL infusion   Intravenous Continuous KuFlora LippsMD 125 mL/hr at 03/04/2015 092119  . sodium chloride (OCEAN) 0.65 % nasal spray 1 spray  1 spray Each Nare Q8H PRN EdJuluis MireMD      . sodium chloride 0.9 % injection 10-40 mL  10-40 mL Intracatheter Q12H KuFlora Lipps MD      . sodium chloride 0.9 % injection 10-40 mL  10-40 mL Intracatheter PRN KuFlora LippsMD      . sodium chloride 0.9 % injection 3 mL  3 mL Intravenous Q12H EdJuluis MireMD   3 mL at 03/08/2015 1000  . vasopressin (PITRESSIN) 40 Units in sodium chloride 0.9 % 250 mL (0.16 Units/mL) infusion  0.03 Units/min Intravenous Continuous KuFlora LippsMD 11.3 mL/hr at 03/14/2015 0925 0.03 Units/min at 02/16/2015 0925  . zolpidem (AMBIEN) tablet 5 mg  5 mg Oral QHS PRN EdJuluis MireMD        Vital Signs: BP 127/88 mmHg  Pulse 64  Temp(Src) 96.5 F (35.8 C) (Axillary)  Resp 31  Ht 5' 7"  (1.702 m)  Wt 145.6 kg (320 lb 15.8 oz)  BMI 50.26 kg/m2  SpO2 95% Filed Weights   03/10/2015 0021 03/01/2015 0500  Weight: 104.327 kg (230 lb) 145.6 kg (320 lb 15.8 oz)    Estimated body mass index is 50.26 kg/(m^2) as calculated from the following:   Height as of this encounter: 5' 7"  (1.702 m).   Weight as of this encounter: 145.6 kg (320 lb 15.8 oz).  PERFORMANCE STATUS (ECOG) : 4 - Bedbound  PHYSICAL EXAM: Unconscious Ventilated not sedated Bruising on chest noted NO JVD seen and no TM Heart rrr --distant HS Lungs with vent sounds no wheezing Abd obese and NT Ext no mottling seen yet.  LABS: CBC:    Component Value Date/Time   WBC 11.9* 02/28/2015 074174  WBC 3.8 10/21/2014 0942   HGB 10.6* 03/13/2015 0702   HGB 9.5* 10/21/2014 0942   HCT 34.9* 03/08/2015 0702   HCT 30.1* 10/21/2014 0942   PLT 108* 03/10/2015 0702   PLT 107* 10/21/2014 0942   MCV 110.3*  0702   MCV 103* 10/21/2014 0942   NEUTROABS 9.5* 12/16/2014 1859   NEUTROABS 2.4 10/21/2014 0942   LYMPHSABS 0.8 12/16/2014 1859   LYMPHSABS 1.2 10/21/2014 0942   MONOABS 0.5 12/16/2014 1859   MONOABS 0.2 10/21/2014 0942   EOSABS 0.0 12/16/2014 1859   EOSABS 0.1 10/21/2014 0942   BASOSABS 0.0 12/16/2014 1859   BASOSABS 0.0 10/21/2014 0942   Comprehensive Metabolic Panel:    Component Value Date/Time   NA 140  03/16/2015 0702   NA 137 10/21/2014 0941   K 4.5 02/21/2015 0702   K 4.0 10/21/2014 0941   CL 102 03/05/2015 0702   CL 96* 10/21/2014 0941   CO2 20* 03/09/2015 0702   CO2 28 10/21/2014 0941   BUN 56* 02/24/2015 0702   BUN 61* 10/21/2014 0941   CREATININE 5.97* 02/15/2015 0702   CREATININE 6.42* 10/21/2014 0941   GLUCOSE 94 03/08/2015 0702   GLUCOSE 121* 10/21/2014 0941   CALCIUM 8.7* 02/25/2015 0702   CALCIUM 8.7* 10/21/2014 0941   CALCIUM 10.2 02/09/2010 1028   AST 29 03/14/2015 0033   AST 20 10/19/2014 1419   ALT 14 03/01/2015 0033   ALT 15 10/19/2014 1419   ALKPHOS 296* 03/09/2015 0033   ALKPHOS 277* 10/19/2014 1419   BILITOT 1.0 03/12/2015 0033   BILITOT 0.3 10/19/2014 1419   PROT 8.5* 02/14/2015 0033   PROT 7.9 10/19/2014 1419   ALBUMIN 4.2 02/15/2015 0033   ALBUMIN 3.5 10/21/2014 0941    IMPRESSION: 1) Sepsis and septic shock presumably from gastroenteritis with diarrhea --now with SBP of 47 and on max dose of 3 pressors 2) Pt coded this am and underwent somewhat prolonged resucitation ---she has a pacer but poor pulses at baseline so it was hard to tell if she was in PEA or not 3) ESRD --with a missed session recently 4) h.o CHF (type not known) 5) sleep apnea 6) h/o many infections and vascular access problems 7) Mental retardation 8) Diarrhea and gastroenteritis 9) Anemia 10), Seizure d/o  (last sz in 2012) 11) HTN 12) h/o PUC 13) Asthma and obesity hypoventilation 14) Anxiety and depression  See PLAN above.  REFERRALS TO BE ORDERED:  None as yet.   More than 50% of the visit was spent in counseling/coordination of care: Yes  Time Spent:70 minutes

## 2015-02-17 NOTE — ED Notes (Signed)
unsuccessful attempt to find pulses from popliteal to pedal pulses with doppler, bilateral patient feet cold to touch. Patient able to move feet. Able to find femoral pulse with doppler.

## 2015-02-17 NOTE — H&P (Signed)
 [facility] NAME: [name]    MR#:  [id number]  DATE OF BIRTH:  12-23-1955  DATE OF ADMISSION:  03/05/2015  PRIMARY CARE PHYSICIAN: Alvester Morin, MD   REQUESTING/REFERRING PHYSICIAN: Dr. Owens Shark  CHIEF COMPLAINT:   Chief Complaint  Patient presents with  . Abdominal Pain  . GI Bleeding   diarrhea with abdominal pain(foul-smelling stool)  HISTORY OF PRESENT ILLNESS:  [name]  is a 60 y.o. female with a known history of below mentioned multiple medical problems, end-stage renal disease on hemodialysis, nursing home resident was brought in with the complaints of acute onset of explosive diarrhea with foul smelling stools with associated abdominal pain which started yesterday. Denies any fever or chills. Denies any chest pain, shortness of breath, nausea. Patient on hemodialysis 3 times a week Tuesday Thursday and Saturday. She states she did not go for dialysis yesterday since he was not feeling well. In the emergency room patient was evaluated by the ED physician, found to be hypotensive with blood pressure of 58/25. Patient was placed on contact isolation, sepsis protocol was initiated and was started on broad-spectrum antibiotics-IV vancomycin and Zosyn for suspected sepsis/SIRS.Marland Kitchen Per nursing home note patient has history of chronic hypotension and usually her blood pressure runs 80-70/30-45. Workup revealed BUN/creatinine 57/5.5, WBC 7.4. Patient was given IV normal saline boluses and hospitalist service will consent for further management.  PAST MEDICAL HISTORY:   Past Medical History  Diagnosis Date  . Renal insufficiency   . Mental retardation   . Sleep apnea   . End-stage renal disease on hemodialysis   . Tuberous sclerosis   . Obesity   . GERD (gastroesophageal reflux disease)   . Depression   . Mental retardation   . Sleep apnea     on c-pap  . Hx MRSA infection   . Obesity   .  Complication of anesthesia     "sometimes I don't wake up on time; depends on how much med"  . Asthma   . 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 Osteoarthritis   . Angina   . CHF (congestive heart failure)   . Shortness of breath     "all the time"  . Pneumonia ? 2006  . Renal failure   . ESRD (end stage renal disease) on dialysis 05/25/11    Tu, Th, Sa  . Blood transfusion   . Anemia   . Stomach ulcer   . Constipated   . Seizures     last seizure 05/21/11  . Pacemaker     AV paced  . Hypertension     PAST SURGICAL HISTORY:   Past Surgical History  Procedure Laterality Date  . Uterine tumor  05/25/11    pt denies this history  . Nephrectomy  01/08/1994; 01/15/2006    left; right  . Shunt in arm for dialysis      right   . Thrombectomy  05/2010    shunt in my right arm  . Cholecystectomy  07/02/1990  . Tonsillectomy  03/03/1978  . Avgg removal  06-26-11    Right arm AVG removed.  . Pacemaker placement    . Vas ins tunn cv cath 5 or older ([facility] hx)      both legs  . Av fistula placement      L  . Arteriovenous graft placement      R arm  . Peripheral vascular catheterization Left 11/19/2014    Procedure: Thrombectomy;  Surgeon: Katha Cabal, MD;  Location: [facility];  Service: Cardiovascular;  Laterality: Left;  . Peripheral vascular catheterization Right 11/19/2014    Procedure: A/V Shuntogram/Fistulagram;  Surgeon: Katha Cabal, MD;  Location: [facility];  Service: Cardiovascular;  Laterality: Right;  . Peripheral vascular catheterization Left 11/17/2014    Procedure: Dialysis/Perma Catheter Insertion;  Surgeon: Katha Cabal, MD;  Location: [facility];  Service: Cardiovascular;  Laterality: Left;    SOCIAL HISTORY:   History  Substance Use Topics  . Smoking status: Never Smoker   . Smokeless tobacco: Never Used  . Alcohol Use: No    FAMILY HISTORY:   Family History  Problem Relation Age of Onset  . Diabetes Father   . Asthma Son      DRUG ALLERGIES:   Allergies  Allergen Reactions  . Iodinated Diagnostic Agents Hives  . Iodine     Other reaction(s): Other (See Comments) Other Reaction: contrast-itching and flushing  . Methylprednisolone Hives  . Other Itching    Other reaction(s): Other (See Comments) Uncoded Allergy. Allergen: iv contrast dye, Other Reaction: flushing Arthritis medications Other reaction(s): Other (See Comments) Uncoded Allergy. Allergen: iv contrast dye, Other Reaction: flushing Arthritis medications  . Pollen Extract Hives  . Trimethadione     Other reaction(s): Other (See Comments) Other Reaction: Other reaction  . Trazodone And Nefazodone Hives and Rash    REVIEW OF SYSTEMS:   Review of Systems  Constitutional: Positive for malaise/fatigue. Negative for fever and chills.  HENT: Negative for ear pain, hearing loss, nosebleeds, sore throat and tinnitus.   Eyes: Negative for blurred vision, double vision, pain, discharge and redness.  Respiratory: Negative for cough, hemoptysis, sputum production, shortness of breath and wheezing.   Cardiovascular: Negative for chest pain, palpitations, orthopnea and leg swelling.  Gastrointestinal: Positive for nausea, abdominal pain and diarrhea. Negative for vomiting, constipation, blood in stool and melena.  Genitourinary: Negative for dysuria, urgency, frequency and hematuria.  Musculoskeletal: Negative for back pain, joint pain and neck pain.  Skin: Negative for itching and rash.  Neurological: Negative for dizziness, tingling, sensory change, focal weakness and seizures.  Endo/Heme/Allergies: Does not bruise/bleed easily.  Psychiatric/Behavioral: Positive for depression. The patient is not nervous/anxious.     MEDICATIONS AT HOME:   Prior to Admission medications   Medication Sig Start Date End Date Taking? Authorizing Provider  gabapentin (NEURONTIN) 100 MG capsule Take 100 mg by mouth 2 (two) times daily.   Yes Historical Provider, MD   guaiFENesin (MUCINEX) 600 MG 12 hr tablet Take 600 mg by mouth 2 (two) times daily.   Yes Historical Provider, MD  polyethylene glycol (MIRALAX / GLYCOLAX) packet Take 17 g by mouth 2 (two) times daily. Mix with 4-8 oz of water.   Yes Historical Provider, MD  RESTASIS 0.05 % ophthalmic emulsion Place 1 drop into both eyes 2 (two) times daily.  06/01/14  Yes Historical Provider, MD  acetaminophen (TYLENOL) 325 MG tablet Take 650 mg by mouth every 6 (six) hours as needed for mild pain.    Historical Provider, MD  ALPRAZolam Duanne Moron) 0.5 MG tablet Take 1 tablet (0.5 mg total) by mouth 2 (two) times daily as needed for anxiety. 12/23/14   Florencia Reasons, MD  antiseptic oral rinse (BIOTENE) LIQD 10 mLs by Mouth Rinse route 3 (three) times daily as needed for dry mouth.     Historical Provider, MD  bisacodyl (DULCOLAX) 5 MG EC tablet Take 2 tablets (10  mg total) by mouth daily as needed for moderate constipation. 09/14/14   Allie Bossier, MD  calcitRIOL (ROCALTROL) 0.25 MCG capsule Take 1 capsule (0.25 mcg total) by mouth Every Tuesday,Thursday,and Saturday with dialysis. 09/14/14   Allie Bossier, MD  calcium acetate (PHOSLO) 667 MG capsule Take 2,001 mg by mouth 3 (three) times daily.     Historical Provider, MD  camphor-menthol Timoteo Ace) lotion Apply 1 application topically 3 (three) times daily as needed for itching.    Historical Provider, MD  Darbepoetin Alfa (ARANESP) 200 MCG/0.4ML SOSY injection Inject 0.4 mLs (200 mcg total) into the vein every Tuesday with hemodialysis. 09/14/14   Allie Bossier, MD  ethyl chloride spray Apply 1 application topically as needed (hemodialysis).    Historical Provider, MD  ferric gluconate 125 mg in sodium chloride 0.9 % 100 mL Inject 125 mg into the vein every Tuesday with hemodialysis. 09/14/14   Allie Bossier, MD  fexofenadine (ALLEGRA) 180 MG tablet Take 180 mg by mouth daily. For allergies    Historical Provider, MD  hydrocortisone (ANUSOL-HC) 25 MG suppository Place 1  suppository (25 mg total) rectally 2 (two) times daily. 09/14/14   Allie Bossier, MD  hydrocortisone (CORTEF) 10 MG tablet Take 10 mg by mouth 2 (two) times daily.  09/17/14   Historical Provider, MD  hydrocortisone (CORTEF) 5 MG tablet Take 5 mg by mouth daily.  10/22/14   Historical Provider, MD  lamoTRIgine (LAMICTAL) 25 MG tablet Take 50 mg by mouth at bedtime.  05/07/14   Historical Provider, MD  levETIRAcetam (KEPPRA) 1000 MG tablet Sun/Mon/Wen/Fri Patient taking differently: Take 1,000 mg by mouth See admin instructions. 1 tablet daily on Sun/Mon/Wen/Fri 09/14/14   Allie Bossier, MD  levETIRAcetam (KEPPRA) 750 MG tablet Tue/Th/Sat Patient taking differently: Take 750 mg by mouth See admin instructions. Take 1 tablet daily on Tue/Th/Sat 09/14/14   Allie Bossier, MD  levothyroxine (SYNTHROID, LEVOTHROID) 50 MCG tablet Take 1 tablet (50 mcg total) by mouth daily before breakfast. 09/14/14   Allie Bossier, MD  metroNIDAZOLE (METROCREAM) 0.75 % cream Apply 1 application topically 2 (two) times daily.  11/12/14   Historical Provider, MD  midodrine (PROAMATINE) 10 MG tablet Take 2 tablets (20 mg total) by mouth 3 (three) times daily. 09/14/14   Allie Bossier, MD  mupirocin ointment (BACTROBAN) 2 % Apply 1 application topically 3 (three) times daily as needed (dressing change).    Historical Provider, MD  pantoprazole (PROTONIX) 40 MG tablet Take 1 tablet (40 mg total) by mouth 2 (two) times daily. 09/14/14 09/14/15  Allie Bossier, MD  phenylephrine-shark liver oil-mineral oil-petrolatum (PREPARATION H) 0.25-3-14-71.9 % rectal ointment Place 1 application rectally 3 (three) times daily as needed for hemorrhoids.    Historical Provider, MD  Propylene Glycol 0.6 % SOLN Apply 1 drop to eye 2 (two) times daily.    Historical Provider, MD  pseudoephedrine (SUDAFED) 30 MG tablet Take 1 tablet (30 mg total) by mouth daily. 11/20/14   Aldean Jewett, MD  risperiDONE (RISPERDAL) 0.5 MG tablet Take 0.5 mg by mouth at  bedtime.  06/04/14   Historical Provider, MD  risperiDONE microspheres (RISPERDAL CONSTA) 37.5 MG injection Inject 37.5 mg into the muscle every 14 (fourteen) days.    Historical Provider, MD  senna-docusate (SENOKOT-S) 8.6-50 MG per tablet Take 2 tablets by mouth 2 (two) times daily.     Historical Provider, MD  sertraline (ZOLOFT) 100 MG tablet Take 100 mg by mouth  daily. Take with 50mg  tablet to equal 150mg     Historical Provider, MD  sertraline (ZOLOFT) 50 MG tablet Take 50 mg by mouth daily. Take with 100mg  tablet to equal 150mg     Historical Provider, MD  sevelamer (RENAGEL) 800 MG tablet Take 1,600 mg by mouth 3 (three) times daily with meals.    Historical Provider, MD  Sod Fluoride-Potassium Nitrate 1.1-5 % PSTE Place 1 application onto teeth at bedtime.    Historical Provider, MD  sodium chloride (OCEAN) 0.65 % SOLN nasal spray Place 1 spray into both nostrils every 8 (eight) hours as needed for congestion.    Historical Provider, MD  SYMBICORT 160-4.5 MCG/ACT inhaler Inhale 2 puffs into the lungs 2 (two) times daily.  05/25/14   Historical Provider, MD  topiramate (TOPAMAX) 200 MG tablet Take 200 mg by mouth 2 (two) times daily.      Historical Provider, MD  traMADol (ULTRAM) 50 MG tablet Take 50 mg by mouth every 8 (eight) hours as needed (pain).     Historical Provider, MD  triamcinolone (KENALOG) 0.025 % cream Apply 1 application topically daily as needed (rosacea). Apply to affected area 09/15/14   Historical Provider, MD  zolpidem (AMBIEN) 5 MG tablet Take 1 tablet (5 mg total) by mouth at bedtime as needed for sleep. 09/14/14   Allie Bossier, MD      VITAL SIGNS:  Blood pressure 59/46, pulse 80, temperature 98.6 F (37 C), temperature source Oral, resp. rate 24, height 5\' 7"  (1.702 m), weight 104.327 kg (230 lb), SpO2 99 %.  PHYSICAL EXAMINATION:  Physical Exam  Constitutional: She is oriented to person, place, and time. She appears well-developed and well-nourished.  Mild  distress + because of abdominal pain  HENT:  Head: Normocephalic and atraumatic.  Right Ear: External ear normal.  Left Ear: External ear normal.  Nose: Nose normal.  Mouth/Throat: Oropharynx is clear and moist. No oropharyngeal exudate.  Eyes: EOM are normal. Pupils are equal, round, and reactive to light. No scleral icterus.  Neck: Normal range of motion. Neck supple. No JVD present. No thyromegaly present.  Cardiovascular: Normal rate, regular rhythm, normal heart sounds and intact distal pulses.  Exam reveals no friction rub.   No murmur heard. Respiratory: Effort normal and breath sounds normal. No respiratory distress. She has no wheezes. She has no rales. She exhibits no tenderness.  GI: Soft. Bowel sounds are normal. She exhibits no distension and no mass. There is tenderness. There is no rebound and no guarding.  Musculoskeletal: Normal range of motion. She exhibits no edema.  Lymphadenopathy:    She has no cervical adenopathy.  Neurological: She is alert and oriented to person, place, and time. She has normal reflexes. She displays normal reflexes. No cranial nerve deficit. She exhibits normal muscle tone.  Skin: Skin is warm. No rash noted. No erythema.  Psychiatric: She has a normal mood and affect. Her behavior is normal. Thought content normal.   LABORATORY PANEL:   CBC  Recent Labs Lab  0033  WBC 7.4  HGB 11.7*  HCT 36.7  PLT 134*   ------------------------------------------------------------------------------------------------------------------  Chemistries   Recent Labs Lab 03/11/2015 0033  NA 140  K 4.6  CL 95*  CO2 29  GLUCOSE 129*  BUN 57*  CREATININE 5.56*  CALCIUM 9.6  AST 29  ALT 14  ALKPHOS 296*  BILITOT 1.0   ------------------------------------------------------------------------------------------------------------------  Cardiac Enzymes No results for input(s): TROPONINI in the last 168  hours. ------------------------------------------------------------------------------------------------------------------  RADIOLOGY:  No results found.  EKG:   Orders placed or performed during the hospital encounter of 12/16/14  . EKG 12-Lead  . EKG 12-Lead  . EKG 12-Lead  . EKG 12-Lead  . EKG  Pacemaker rhythm with ventricular rate of 10 2 bpm.  IMPRESSION AND PLAN:   1. Explosive foul-smelling diarrhea with associated abdominal pain, suspect C. difficile diarrhea. Plan: Admit to ICU, IV hydration, follow BP monitoring, contact isolation, follow-up stool studies, start IV metronidazole. 2. Sirs, possible sepsis. Patient received broad-spectrum IV antibiotics-IV Vanco and Zosyn in the ED. Blood cultures obtained, chest x-ray requested pending at this time. Plan: Continue Zosyn for now, follow-up CBC and blood cultures. 3. Hypertension, chronic per nursing home note. BP now further low-58/35. Plan: Continue IV hydration, follow-up BP monitoring closely. Consider pressors if he remains hypotensive. 4. End-stage renal disease on HD, BUN/creatinine 57/5.5. Plan: Monitor, nephrology consultation requested. 5. History of asthma versus reactive airway disease, stable on home medications. Continue same. 6. Depression stable on home medications. Continue same. 7. Sleep apnea on CPAP. Continue same    All the records are reviewed and case discussed with ED provider. Management plans discussed with the patient,and  in agreement.  CODE STATUS: Full code  TOTAL TIME TAKING CARE OF THIS PATIENT: 50 minutes.    Azucena Freed N M.D on 02/27/2015 at 2:30 AM  Between 7am to 6pm - Pager - [phone]  After 6pm go to [email or web address] - password [email or web address] [facility]  Office  [phone]  CC: Primary care physician; Alvester Morin, MD

## 2015-02-17 NOTE — Consult Note (Signed)
PULMONARY / CRITICAL CARE MEDICINE   Name: PASLEIGH DESIO MRN: [id number] DOB: 1956/04/17    ADMISSION DATE:  03/19/2015   CHIEF COMPLAINT:  resp failure and diarhea  HISTORY OF PRESENT ILLNESS   59 y.o. female with a known history of below mentioned multiple medical problems, end-stage renal disease on hemodialysis, nursing home resident was brought in with the complaints of acute onset of explosive diarrhea with foul smelling stools with associated abdominal pain which started yesterday. As per ER report,  Patient on hemodialysis 3 times a week Tuesday Thursday and Saturday. She states she did not go for dialysis yesterday since he was not feeling well. In the emergency room patient was evaluated by the ED physician, found to be hypotensive with blood pressure of 58/25. Patient was placed on contact isolation, sepsis protocol was initiated and was started on broad-spectrum antibiotics-IV vancomycin and Zosyn for suspected sepsis/SIRS.Marland Kitchen Per nursing home note patient has history of chronic hypotension and usually her blood pressure runs 80-70/30-45.   Patient with acute cardiac arrest this AM, PEA/Asystole for approx 30 mins    SIGNIFICANT EVENTS   Multiple admissions   PAST MEDICAL HISTORY    :  Past Medical History  Diagnosis Date  . Renal insufficiency   . Mental retardation   . Sleep apnea   . End-stage renal disease on hemodialysis   . Tuberous sclerosis   . Obesity   . GERD (gastroesophageal reflux disease)   . Depression   . Mental retardation   . Sleep apnea     on c-pap  . Hx MRSA infection   . Obesity   . Complication of anesthesia     "sometimes I don't wake up on time; depends on how much med"  . Asthma   . 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 Osteoarthritis   . Angina   . CHF (congestive heart failure)   . Shortness of breath     "all the time"  . Pneumonia ? 2006  . Renal failure   . ESRD (end stage renal disease) on dialysis 05/25/11    Tu, Th, Sa  . Blood  transfusion   . Anemia   . Stomach ulcer   . Constipated   . Seizures     last seizure 05/21/11  . Pacemaker     AV paced  . Hypertension    Past Surgical History  Procedure Laterality Date  . Uterine tumor  05/25/11    pt denies this history  . Nephrectomy  01/08/1994; 01/15/2006    left; right  . Shunt in arm for dialysis      right   . Thrombectomy  05/2010    shunt in my right arm  . Cholecystectomy  07/02/1990  . Tonsillectomy  03/03/1978  . Avgg removal  06-26-11    Right arm AVG removed.  . Pacemaker placement    . Vas ins tunn cv cath 5 or older ([facility] hx)      both legs  . Av fistula placement      L  . Arteriovenous graft placement      R arm  . Peripheral vascular catheterization Left 11/19/2014    Procedure: Thrombectomy;  Surgeon: Renford Dills, MD;  Location: [facility] INVASIVE CV LAB;  Service: Cardiovascular;  Laterality: Left;  . Peripheral vascular catheterization Right 11/19/2014    Procedure: A/V Shuntogram/Fistulagram;  Surgeon: Renford Dills, MD;  Location: [facility] INVASIVE CV LAB;  Service: Cardiovascular;  Laterality: Right;  . Peripheral vascular catheterization  Left 11/17/2014    Procedure: Dialysis/Perma Catheter Insertion;  Surgeon: Renford Dills, MD;  Location: [facility] INVASIVE CV LAB;  Service: Cardiovascular;  Laterality: Left;   Prior to Admission medications   Medication Sig Start Date End Date Taking? Authorizing Provider  acetaminophen (TYLENOL) 325 MG tablet Take 650 mg by mouth every 6 (six) hours as needed for mild pain.   Yes Historical Provider, MD  ALPRAZolam Prudy Feeler) 0.5 MG tablet Take 1 tablet (0.5 mg total) by mouth 2 (two) times daily as needed for anxiety. 12/23/14  Yes Albertine Grates, MD  antiseptic oral rinse (BIOTENE) LIQD 10 mLs by Mouth Rinse route 3 (three) times daily as needed for dry mouth.    Yes Historical Provider, MD  bisacodyl (DULCOLAX) 5 MG EC tablet Take 2 tablets (10 mg total) by mouth daily as needed for moderate constipation.  09/14/14  Yes Drema Dallas, MD  calcitRIOL (ROCALTROL) 0.25 MCG capsule Take 1 capsule (0.25 mcg total) by mouth Every Tuesday,Thursday,and Saturday with dialysis. 09/14/14  Yes Drema Dallas, MD  calcium acetate (PHOSLO) 667 MG capsule Take 2,001 mg by mouth 3 (three) times daily.    Yes Historical Provider, MD  camphor-menthol Wynelle Fanny) lotion Apply 1 application topically 3 (three) times daily as needed for itching.   Yes Historical Provider, MD  ethyl chloride spray Apply 1 application topically as needed (hemodialysis).   Yes Historical Provider, MD  fexofenadine (ALLEGRA) 180 MG tablet Take 180 mg by mouth daily. For allergies   Yes Historical Provider, MD  gabapentin (NEURONTIN) 100 MG capsule Take 100 mg by mouth 2 (two) times daily.   Yes Historical Provider, MD  guaiFENesin (MUCINEX) 600 MG 12 hr tablet Take 600 mg by mouth 2 (two) times daily.   Yes Historical Provider, MD  hydrocortisone (ANUSOL-HC) 25 MG suppository Place 1 suppository (25 mg total) rectally 2 (two) times daily. 09/14/14  Yes Drema Dallas, MD  hydrocortisone (CORTEF) 10 MG tablet Take 10 mg by mouth 2 (two) times daily.   Yes Historical Provider, MD  hydrocortisone (CORTEF) 5 MG tablet Take 5 mg by mouth daily. With 10mg  dose, to make 15mg  dose in the evening.   Yes Historical Provider, MD  lamoTRIgine (LAMICTAL) 25 MG tablet Take 50 mg by mouth at bedtime.  05/07/14  Yes Historical Provider, MD  levETIRAcetam (KEPPRA) 1000 MG tablet Sun/Mon/Wen/Fri Patient taking differently: Take 1,000 mg by mouth daily. Sun/Mon/Wen/Fri 09/14/14  Yes Drema Dallas, MD  levETIRAcetam (KEPPRA) 750 MG tablet Tue/Th/Sat Patient taking differently: Take 750 mg by mouth See admin instructions. Take 1 tablet daily on Tue/Th/Sat 09/14/14  Yes Drema Dallas, MD  levothyroxine (SYNTHROID, LEVOTHROID) 50 MCG tablet Take 1 tablet (50 mcg total) by mouth daily before breakfast. 09/14/14  Yes Drema Dallas, MD  metroNIDAZOLE (METROCREAM) 0.75 % cream  Apply 1 application topically 2 (two) times daily.  11/12/14  Yes Historical Provider, MD  midodrine (PROAMATINE) 10 MG tablet Take 2 tablets (20 mg total) by mouth 3 (three) times daily. 09/14/14  Yes Drema Dallas, MD  mupirocin cream (BACTROBAN) 2 % Apply 1 application topically 3 (three) times daily as needed (for dressing changes).   Yes Historical Provider, MD  mupirocin ointment (BACTROBAN) 2 % Apply 1 application topically 3 (three) times daily as needed (dressing change).   Yes Historical Provider, MD  pantoprazole (PROTONIX) 40 MG tablet Take 1 tablet (40 mg total) by mouth 2 (two) times daily. 09/14/14 09/14/15 Yes Drema Dallas,  MD  phenylephrine-shark liver oil-mineral oil-petrolatum (PREPARATION H) 0.25-3-14-71.9 % rectal ointment Place 1 application rectally 3 (three) times daily as needed for hemorrhoids.   Yes Historical Provider, MD  polyethylene glycol (MIRALAX / GLYCOLAX) packet Take 17 g by mouth 2 (two) times daily. Mix with 4-8 oz of water.   Yes Historical Provider, MD  Propylene Glycol 0.6 % SOLN Apply 1 drop to eye 2 (two) times daily.   Yes Historical Provider, MD  RESTASIS 0.05 % ophthalmic emulsion Place 1 drop into both eyes 2 (two) times daily.  06/01/14  Yes Historical Provider, MD  risperiDONE (RISPERDAL) 0.5 MG tablet Take 0.5 mg by mouth at bedtime.  06/04/14  Yes Historical Provider, MD  risperiDONE microspheres (RISPERDAL CONSTA) 37.5 MG injection Inject 37.5 mg into the muscle every 14 (fourteen) days.   Yes Historical Provider, MD  senna-docusate (SENOKOT-S) 8.6-50 MG per tablet Take 2 tablets by mouth 2 (two) times daily.    Yes Historical Provider, MD  sertraline (ZOLOFT) 100 MG tablet Take 100 mg by mouth daily. Take with 50mg  tablet to equal 150mg    Yes Historical Provider, MD  sertraline (ZOLOFT) 50 MG tablet Take 50 mg by mouth daily. Take with 100mg  tablet to equal 150mg    Yes Historical Provider, MD  sevelamer (RENAGEL) 800 MG tablet Take 1,600 mg by mouth 3  (three) times daily with meals.   Yes Historical Provider, MD  Sod Fluoride-Potassium Nitrate 1.1-5 % PSTE Place 1 application onto teeth at bedtime.   Yes Historical Provider, MD  sodium chloride (OCEAN) 0.65 % SOLN nasal spray Place 1 spray into both nostrils every 8 (eight) hours as needed for congestion.   Yes Historical Provider, MD  SYMBICORT 160-4.5 MCG/ACT inhaler Inhale 2 puffs into the lungs 2 (two) times daily.  05/25/14  Yes Historical Provider, MD  topiramate (TOPAMAX) 200 MG tablet Take 200 mg by mouth 2 (two) times daily.     Yes Historical Provider, MD  traMADol (ULTRAM) 50 MG tablet Take 50 mg by mouth every 8 (eight) hours as needed (pain).    Yes Historical Provider, MD  triamcinolone (KENALOG) 0.025 % cream Apply 1 application topically daily as needed (rosacea). Apply to affected area 09/15/14  Yes Historical Provider, MD  zolpidem (AMBIEN) 5 MG tablet Take 1 tablet (5 mg total) by mouth at bedtime as needed for sleep. 09/14/14  Yes Drema Dallas, MD  Darbepoetin Alfa (ARANESP) 200 MCG/0.4ML SOSY injection Inject 0.4 mLs (200 mcg total) into the vein every Tuesday with hemodialysis. 09/14/14   Drema Dallas, MD  ferric gluconate 125 mg in sodium chloride 0.9 % 100 mL Inject 125 mg into the vein every Tuesday with hemodialysis. 09/14/14   Drema Dallas, MD   Allergies  Allergen Reactions  . Iodinated Diagnostic Agents Hives  . Iodine     Other reaction(s): Other (See Comments) Other Reaction: contrast-itching and flushing  . Methylprednisolone Hives  . Other Itching    Other reaction(s): Other (See Comments) Uncoded Allergy. Allergen: iv contrast dye, Other Reaction: flushing Arthritis medications Other reaction(s): Other (See Comments) Uncoded Allergy. Allergen: iv contrast dye, Other Reaction: flushing Arthritis medications  . Pollen Extract Hives  . Trimethadione     Other reaction(s): Other (See Comments) Other Reaction: Other reaction  . Trazodone And Nefazodone Hives  and Rash     FAMILY HISTORY   Family History  Problem Relation Age of Onset  . Diabetes Father   . Asthma Son  SOCIAL HISTORY    reports that she has never smoked. She has never used smokeless tobacco. She reports that she does not drink alcohol or use illicit drugs.  Review of Systems  Unable to perform ROS: critical illness      VITAL SIGNS    Temp:  [96.5 F (35.8 C)-98.6 F (37 C)] 96.5 F (35.8 C) (08/04 0500) Pulse Rate:  [60-105] 64 (08/04 0620) Resp:  [18-31] 31 (08/04 0620) BP: (54-127)/(25-88) 127/88 mmHg (08/04 0620) SpO2:  [93 %-100 %] 95 % (08/04 0620) Weight:  [230 lb (104.327 kg)-320 lb 15.8 oz (145.6 kg)] 320 lb 15.8 oz (145.6 kg) (08/04 0500) HEMODYNAMICS:   VENTILATOR SETTINGS: Vent Mode:  [-] PRVC Set Rate:  [20 bmp] 20 bmp Vt Set:  [500 mL] 500 mL PEEP:  [5 cmH20] 5 cmH20 INTAKE / OUTPUT: No intake or output data in the 24 hours ending 03-12-2015 0840     PHYSICAL EXAM   Physical Exam  Constitutional: She appears distressed.  HENT:  Head: Normocephalic and atraumatic.  Eyes: Pupils are equal, round, and reactive to light. No scleral icterus.  Neck: Normal range of motion. Neck supple.  Cardiovascular: Normal rate and regular rhythm.   No murmur heard. Pulmonary/Chest: She is in respiratory distress. She has no wheezes. She has rales.  resp distress  Abdominal: Soft. She exhibits no distension. There is no tenderness.  Musculoskeletal: She exhibits no edema.  Neurological: She displays normal reflexes. Coordination normal.  gcs<8T  Skin: Skin is warm. No rash noted. She is diaphoretic.       LABS   LABS:  CBC  Recent Labs Lab March 12, 2015 0033 2015-03-12 0702  WBC 7.4 11.9*  HGB 11.7* 10.6*  HCT 36.7 34.9*  PLT 134* 108*   Coag's No results for input(s): APTT, INR in the last 168 hours. BMET  Recent Labs Lab 2015/03/12 0033 03/12/2015 0702  NA 140 140  K 4.6 4.5  CL 95* 102  CO2 29 20*  BUN 57* 56*   CREATININE 5.56* 5.97*  GLUCOSE 129* 94   Electrolytes  Recent Labs Lab 03-12-15 0033 2015-03-12 0702  CALCIUM 9.6 8.7*   Sepsis Markers  Recent Labs Lab 03/12/2015 0033  LATICACIDVEN 2.4*   ABG  Recent Labs Lab Mar 12, 2015 0800  PHART 7.10*  PCO2ART 81*  PO2ART 69*   Liver Enzymes  Recent Labs Lab 2015/03/12 0033  AST 29  ALT 14  ALKPHOS 296*  BILITOT 1.0  ALBUMIN 4.2   Cardiac Enzymes No results for input(s): TROPONINI, PROBNP in the last 168 hours. Glucose No results for input(s): GLUCAP in the last 168 hours.   Recent Results (from the past 240 hour(s))  C difficile quick scan w PCR reflex     Status: Abnormal   Collection Time: 2015/03/12  1:53 AM  Result Value Ref Range Status   C Diff antigen POSITIVE (A) NEGATIVE Final   C Diff toxin NEGATIVE NEGATIVE Final   C Diff interpretation   Final    Negative for toxigenic C. difficile. Toxin gene and active toxin production not detected. May be a nontoxigenic strain of C. difficile bacteria present, lacking the ability to produce toxin.  Clostridium Difficile by PCR     Status: None   Collection Time: 03-12-2015  1:53 AM  Result Value Ref Range Status   Toxigenic C Difficile by pcr NEGATIVE NEGATIVE Final  MRSA PCR Screening     Status: None   Collection Time: 03-12-2015  5:20 AM  Result  Value Ref Range Status   MRSA by PCR NEGATIVE NEGATIVE Final    Comment:        The GeneXpert MRSA Assay (FDA approved for NASAL specimens only), is one component of a comprehensive MRSA colonization surveillance program. It is not intended to diagnose MRSA infection nor to guide or monitor treatment for MRSA infections.      Current facility-administered medications:  .  0.9 %  sodium chloride infusion, , Intravenous, Continuous, Crissie Figures, MD, Last Rate: 100 mL/hr at 2015/02/22 0349 .  acetaminophen (TYLENOL) tablet 650 mg, 650 mg, Oral, Q6H PRN **OR** acetaminophen (TYLENOL) suppository 650 mg, 650 mg, Rectal,  Q6H PRN, Crissie Figures, MD .  ALPRAZolam Prudy Feeler) tablet 0.5 mg, 0.5 mg, Oral, BID PRN, Crissie Figures, MD .  budesonide-formoterol (SYMBICORT) 160-4.5 MCG/ACT inhaler 2 puff, 2 puff, Inhalation, BID, Crissie Figures, MD .  calcitRIOL (ROCALTROL) capsule 0.25 mcg, 0.25 mcg, Oral, Q T,Th,Sa-HD, Crissie Figures, MD .  calcium acetate (PHOSLO) capsule 2,001 mg, 2,001 mg, Oral, TID WC, Crissie Figures, MD .  cycloSPORINE (RESTASIS) 0.05 % ophthalmic emulsion 1 drop, 1 drop, Both Eyes, BID, Crissie Figures, MD .  gabapentin (NEURONTIN) capsule 100 mg, 100 mg, Oral, BID, Crissie Figures, MD .  guaiFENesin (MUCINEX) 12 hr tablet 600 mg, 600 mg, Oral, BID, Crissie Figures, MD .  heparin injection 5,000 Units, 5,000 Units, Subcutaneous, 3 times per day, Crissie Figures, MD, 5,000 Units at February 22, 2015 [phone] .  hydrocortisone (ANUSOL-HC) suppository 25 mg, 25 mg, Rectal, BID, Crissie Figures, MD .  hydrocortisone (CORTEF) tablet 10 mg, 10 mg, Oral, BID, Crissie Figures, MD .  hydrocortisone (CORTEF) tablet 5 mg, 5 mg, Oral, QHS, Crissie Figures, MD .  ipratropium-albuterol (DUONEB) 0.5-2.5 (3) MG/3ML nebulizer solution 3 mL, 3 mL, Nebulization, Q6H, Crissie Figures, MD .  lamoTRIgine (LAMICTAL) tablet 50 mg, 50 mg, Oral, QHS, Crissie Figures, MD .  Melene Muller ON 03/01/2015] levETIRAcetam (KEPPRA) tablet 1,000 mg, 1,000 mg, Oral, Q M,W,F,Su-1800, Crissie Figures, MD .  levETIRAcetam (KEPPRA) tablet 750 mg, 750 mg, Oral, Q T,Th,Sat-1800, Crissie Figures, MD .  levothyroxine (SYNTHROID, LEVOTHROID) tablet 50 mcg, 50 mcg, Oral, QAC breakfast, Crissie Figures, MD .  loratadine (CLARITIN) tablet 10 mg, 10 mg, Oral, Daily, Crissie Figures, MD .  metroNIDAZOLE (FLAGYL) IVPB 500 mg, 500 mg, Intravenous, Q8H, Crissie Figures, MD, Stopped at 2015-02-22 [phone] .  midodrine (PROAMATINE) tablet 20 mg, 20 mg, Oral, TID PC, Crissie Figures, MD .  morphine 2 MG/ML injection 2 mg, 2 mg, Intravenous, Q4H PRN, Crissie Figures, MD .  norepinephrine (LEVOPHED) 4mg  in D5W premix infusion, 0-40 mcg/min, Intravenous, Titrated, Crissie Figures, MD, Last Rate: 18.8 mL/hr at 02-22-2015 0653, 5 mcg/min at 02-22-15 0653 .  ondansetron (ZOFRAN) tablet 4 mg, 4 mg, Oral, Q6H PRN **OR** ondansetron (ZOFRAN) injection 4 mg, 4 mg, Intravenous, Q6H PRN, Crissie Figures, MD .  phenylephrine-shark liver oil-mineral oil-petrolatum (PREPARATION H) rectal ointment 1 application, 1 application, Rectal, TID PRN, Crissie Figures, MD .  piperacillin-tazobactam (ZOSYN) IVPB 3.375 g, 3.375 g, Intravenous, Q12H, Crissie Figures, MD .  pseudoephedrine (SUDAFED) tablet 30 mg, 30 mg, Oral, Daily, Crissie Figures, MD .  risperiDONE (RISPERDAL) tablet 0.5 mg, 0.5 mg, Oral, QHS, Crissie Figures, MD .  sertraline (ZOLOFT) tablet 100 mg, 100 mg, Oral, Daily, Crissie Figures, MD .  sertraline (ZOLOFT) tablet  50 mg, 50 mg, Oral, Daily, Crissie Figures, MD .  sevelamer carbonate (RENVELA) tablet 800 mg, 800 mg, Oral, TID WC, Crissie Figures, MD .  Sod Fluoride-Potassium Nitrate 1.1-5 % PSTE 1 application, 1 application, dental, QHS, Crissie Figures, MD .  sodium bicarbonate 150 mEq in sterile water 1,000 mL infusion, , Intravenous, Continuous, Erin Fulling, MD .  sodium chloride (OCEAN) 0.65 % nasal spray 1 spray, 1 spray, Each Nare, Q8H PRN, Crissie Figures, MD .  sodium chloride 0.9 % injection 3 mL, 3 mL, Intravenous, Q12H, Crissie Figures, MD .  topiramate (TOPAMAX) tablet 200 mg, 200 mg, Oral, BID, Crissie Figures, MD .  zolpidem (AMBIEN) tablet 5 mg, 5 mg, Oral, QHS PRN, Crissie Figures, MD  IMAGING    Dg Chest 1 View  03/04/2015   CLINICAL DATA:  Diarrhea.  Incontinence.  Dyspnea.  EXAM: CHEST  1 VIEW  COMPARISON:  12/16/2014  FINDINGS: There is moderate unchanged cardiomegaly. No large effusion. No focal airspace consolidation. Pulmonary vasculature is normal.  IMPRESSION: Cardiomegaly.  No acute findings.   Electronically  Signed   By: Ellery Plunk M.D.   On: 03/10/2015 02:55      Indwelling Urinary Catheter continued, requirement due to   Reason to continue Indwelling Urinary Catheter for strict Intake/Output monitoring for hemodynamic instability         Ventilator continued, requirement due to, resp failure    Ventilator Sedation RASS 0 to -2      ASSESSMENT/PLAN   59 yo morbidly obese white female admitted to ICU for acute septic shock and hypovolumic shock with diarrhea with acute cardiac arrest from severe acidosis  PULMONARY -Respiratory Failure -continue Full MV support -continue Bronchodilator Therapy -Wean Fio2 and PEEP as tolerated    CARDIOVASCULAR-i have attempted Left femoral c line multiple times and could not thread wire thorugh -sepsis/hypovolumic/cardiogenic shock -use vasopressors to keep MAP>65 -follow ABG and LA -follow up cultures -emperic ABX -consider stress dose steroids    RENAL ESRD on HD -follow chem 7 -follow UO -continue Foley Catheter-assess need -follow nephrology recs -start bicarb infusion   GASTROINTESTINAL NPO for now -monitor fecal output -PPI for prophylaxis  HEMATOLOGIC Follow h/h  INFECTIOUS -emperic abx -follow up cultures  ENDOCRINE - ICU hypoglycemic\Hyperglycemia protocol   NEUROLOGIC -avoid sedatives for now -assess Neuro status     I have personally obtained a history, examined the patient, evaluated laboratory and independently reviewed  imaging results, formulated the assessment and plan and placed orders.  The Patient requires high complexity decision making for assessment and support, frequent evaluation and titration of therapies, application of advanced monitoring technologies and extensive interpretation of multiple databases. Critical Care Time devoted to patient care services described in this note is 55 minutes.   Overall, patient is critically ill, prognosis is guarded. Patient at high risk for  cardiac arrest and death. Patient with multiorgan failure Recommend Palliative care consult and DNR status   Aunna Snooks Santiago Glad, M.D.  [facility]  Medical Director [facility] Medical Director [facility] Cardio-Pulmonary Department

## 2015-02-17 NOTE — ED Notes (Signed)
Dr. Reece Levy notified of elevated Lactic Acid 2.4

## 2015-02-17 NOTE — Progress Notes (Addendum)
 [facility] [place] NAME: [name]    MR#:  [id number]  DATE OF BIRTH:  1955-12-05  SUBJECTIVE:  CHIEF COMPLAINT:   Chief Complaint  Patient presents with  . Abdominal Pain  . GI Bleeding   CODE BLUE this morning due to hypotension loss of pulse/respiratory failure. She is completely paced. Now on mechanical ventilation. Approximately 20-30 minutes of CPR. No on levophed with borderline blood pressure  REVIEW OF SYSTEMS:   ROS unable to obtain  DRUG ALLERGIES:   Allergies  Allergen Reactions  . Iodinated Diagnostic Agents Hives  . Iodine     Other reaction(s): Other (See Comments) Other Reaction: contrast-itching and flushing  . Methylprednisolone Hives  . Other Itching    Other reaction(s): Other (See Comments) Uncoded Allergy. Allergen: iv contrast dye, Other Reaction: flushing Arthritis medications Other reaction(s): Other (See Comments) Uncoded Allergy. Allergen: iv contrast dye, Other Reaction: flushing Arthritis medications  . Pollen Extract Hives  . Trimethadione     Other reaction(s): Other (See Comments) Other Reaction: Other reaction  . Trazodone And Nefazodone Hives and Rash    VITALS:  Blood pressure 127/88, pulse 64, temperature 96.5 F (35.8 C), temperature source Axillary, resp. rate 31, height 5\' 7"  (1.702 m), weight 145.6 kg (320 lb 15.8 oz), SpO2 95 %.  PHYSICAL EXAMINATION:  GENERAL:  59 y.o.-year-old patient lying in the bed, obese, acutely ill LUNGS: Coarse breath sounds, rhonchi, poor air movement CARDIOVASCULAR: Tachycardic, S1, S2 normal. No murmurs, rubs, or gallops.  ABDOMEN: Soft, nontender, nondistended. Bowel sounds diminished. EXTREMITIES: Diffuse trace edema, diminished pulses, AV graft right thigh NEUROLOGIC: Nonresponsive  PSYCHIATRIC: Unresponsive  SKIN: Pale   LABORATORY PANEL:   CBC  Recent Labs Lab  0702  WBC 11.9*  HGB 10.6*  HCT 34.9*  PLT 108*    ------------------------------------------------------------------------------------------------------------------  Chemistries   Recent Labs Lab 02/24/2015 0033 03/12/2015 0702  NA 140 140  K 4.6 4.5  CL 95* 102  CO2 29 20*  GLUCOSE 129* 94  BUN 57* 56*  CREATININE 5.56* 5.97*  CALCIUM 9.6 8.7*  AST 29  --   ALT 14  --   ALKPHOS 296*  --   BILITOT 1.0  --    ------------------------------------------------------------------------------------------------------------------  Cardiac Enzymes No results for input(s): TROPONINI in the last 168 hours. ------------------------------------------------------------------------------------------------------------------  RADIOLOGY:  Dg Chest 1 View  02/16/2015   CLINICAL DATA:  Diarrhea.  Incontinence.  Dyspnea.  EXAM: CHEST  1 VIEW  COMPARISON:  12/16/2014  FINDINGS: There is moderate unchanged cardiomegaly. No large effusion. No focal airspace consolidation. Pulmonary vasculature is normal.  IMPRESSION: Cardiomegaly.  No acute findings.   Electronically Signed   By: Andreas Newport M.D.   On: 03/04/2015 02:55    EKG:   Orders placed or performed during the hospital encounter of 02/17/2015  . EKG 12-Lead  . EKG 12-Lead    ASSESSMENT AND PLAN:   #1 ACS: During resuscitation receiving epinephrine, bicarbonate, Levophed. Current heart rate in the 80s, blood pressure marginal. Acidotic with pH of 7.1, Repeat labs pending. Vascular surgery consultation for access, currently with several tenuous peripheral IVs. - Appreciate critical care following - Appreciate vascular surgery placement of central line today - Family updated and they will be here this afternoon for goals of care conversation - Prognosis is very poor with high likelihood of recurrent events today  #2 metabolic acidosis due to GI volume loss, sepsis, end-stage renal disease - 8 Amps of  bicarbonate given,  - possible HD today  #3 sepsis and septic shock due to  gastroenteritis - Clostridium difficile is negative - Stool cultures pending, blood cultures pending - recent AV graft infection   #4 end-stage renal disease on hemodialysis - missed HD this week - Possible dialysis today if BP improves, graft possibly clotted - appreciate nephrology following - note that she has had a recent AV graft infection in June  #5  Hypotension - apparent baseline BP is with systolic in 03'P - continue levophed, midodrine and stress dose steriods  #6 acute respiratory failure - intubated this morning during resuscitation, continue mechanical ventilation, pulmonology following  #7 congestive heart failure, Sick sinus syndrone - appreciate cardiology consulting - repeat ECHO, prior EF 55-50% - paced with pacer in the abdomen   CODE STATUS: Full   TOTAL Critical care TIME TAKING CARE OF THIS PATIENT: 55 minutes.  Greater than 50% of time spent in care coordination and counseling. Spoke with CCM, nephrology, family. POSSIBLE D/C IN ? DAYS, DEPENDING ON CLINICAL CONDITION.   Myrtis Ser M.D on 02/28/2015 at 8:39 AM  Between 7am to 6pm - Pager - [phone]  After 6pm go to [email or web address] - password [email or web address] [facility]  Office  [phone]  CC: Primary care physician; Alvester Morin, MD

## 2015-02-17 NOTE — Op Note (Signed)
OPERATIVE NOTE   PROCEDURE: 1. Ultrasound guidance for vascular access left femoral vein 2. Placement of a 30 cm triple lumen dialysis catheter/central line left femoral vein  PRE-OPERATIVE DIAGNOSIS: 1. ESRD 2. 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 Profound hypotension requiring pressors 4. Cardiopulmonary arrest requiring CPR  POST-OPERATIVE DIAGNOSIS: Same  SURGEON: Festus Barren, MD  ASSISTANT(S): None  ANESTHESIA: local  ESTIMATED BLOOD LOSS: Minimal   FINDING(S): 1. None  SPECIMEN(S): None  INDICATIONS:  Patient is a 59 year old female who presents with  profound hypotension and had a cardiac arrest. Her right thigh AV graft appears to be thrombosed. She needs a venous access but if she survives this she would also need a catheter for dialysis more than likely, so a triple-lumen dialysis catheter will be placed for both central venous access and a dialysis catheter.  Risks and benefits were discussed, and informed consent was obtained..  DESCRIPTION: After obtaining full informed written consent, the patient was laid flat in the bed. The left groin was sterilely prepped and draped in a sterile surgical field was created. The left femoral vein was visualized with ultrasound and found to be widely patent. It was then accessed under direct guidance without difficulty with a Seldinger needle and a permanent image was recorded. A J-wire was then placed. After skin nick and dilatation, a 30 cm triple-lumen trialysis type dialysis catheter was placed over the wire and the wire was removed. The lumens withdrew dark red nonpulsatile blood and flushed easily with sterile saline. The catheter was secured to the skin with 3 nylon sutures. Sterile dressing was placed.  COMPLICATIONS: None  CONDITION: Stable  Charna Neeb March 07, 2015 11:15 AM

## 2015-02-17 NOTE — ED Notes (Signed)
Patient present to ED via EMS from [facility] with complaint of abdominal pain and diarrhea since 2 pm today, per EMS bright red blood was seen in stool, per EMS patient has history of hemorrhoids. Patient reports "I have been feeling sick since 11 this morning." Patient appears to be falling in and out of sleep during triage, verbally stimulated. Patient is a dialysis patient. No increased work in breathing noted.

## 2015-02-17 NOTE — ED Provider Notes (Signed)
 Pain Score  0024 10     Pain Loc --      Pain Edu? --      Excl. in [place]? --      Constitutional: Alert and oriented. Well appearing and in no distress. Eyes: Conjunctivae are normal. PERRL.  Normal extraocular movements. ENT   Head: Normocephalic and atraumatic.   Nose: No congestion/rhinnorhea.   Mouth/Throat: Mucous membranes are moist.   Neck: No stridor. Hematological/Lymphatic/Immunilogical: No cervical lymphadenopathy. Cardiovascular: Normal rate, regular rhythm. Normal and symmetric distal pulses are present in all extremities. No murmurs, rubs, or gallops. Respiratory: Normal respiratory effort without tachypnea nor retractions. Breath sounds are clear and equal bilaterally. No wheezes/rales/rhonchi. Gastrointestinal: Generalized tenderness to palpation. No distention. There is no CVA tenderness. Guaiac positive Genitourinary: deferred Musculoskeletal: Nontender with normal range of motion in all extremities. No joint effusions.  No lower extremity tenderness nor edema. Neurologic:  Normal speech and language. No gross focal neurologic deficits are appreciated. Speech is normal.  Skin:  Skin is warm, dry and intact. No rash noted. Psychiatric: Mood and affect are normal. Speech and behavior are normal. Patient exhibits appropriate insight and judgment.  ____________________________________________    LABS (pertinent positives/negatives)  Labs Reviewed  CBC - Abnormal; Notable for the following:    RBC 3.45 (*)    Hemoglobin 11.7 (*)    MCV 106.4 (*)    MCHC 31.9 (*)    RDW 20.8 (*)    Platelets 134 (*)    All other components within normal limits  COMPREHENSIVE METABOLIC PANEL - Abnormal; Notable for the following:    Chloride 95 (*)    Glucose, Bld 129 (*)    BUN 57 (*)    Creatinine, Ser 5.56 (*)    Total Protein 8.5 (*)    Alkaline Phosphatase 296 (*)    GFR calc non Af Amer 8 (*)    GFR calc Af Amer 9 (*)    Anion gap 16 (*)    All other components within normal limits  CULTURE, BLOOD (ROUTINE X 2)  CULTURE, BLOOD (ROUTINE X 2)  C DIFFICILE QUICK SCAN W PCR REFLEX  LACTIC ACID, PLASMA  TYPE AND SCREEN  ABO/RH      ____________________________________________   EKG  ED ECG REPORT I, Kamare Caspers, [place], the attending physician, personally viewed and interpreted this ECG.   Date: 02/27/2015  EKG Time: 0025  Rate: 105  Rhythm: Sinus tachycardia  Axis: None  Intervals: Normal  ST&T Change: None    Critical Care performed: 60 minutes ____________________________________________   INITIAL IMPRESSION / ASSESSMENT AND PLAN / ED COURSE  Pertinent labs & imaging results that were available during my care of the patient were reviewed by me and considered in my medical decision making (see chart for details).  History of physical exam consistent with sepsis most likely secondary to infectious diarrhea concern for C. difficile. Patient received 2 L normal saline IV bolus with improvement in blood pressure to 85/64. In addition patient received IV vancomycin and Zosyn. Patient discussed with Dr. Reece Levy for hospital admission for further evaluation and treatment.  ____________________________________________   FINAL CLINICAL IMPRESSION(S) / ED DIAGNOSES  Final diagnoses:  C. difficile enteritis  SIRS (systemic inflammatory response syndrome)      Gregor Hams, MD 03/12/2015 [phone]  Pain Score  0024 10     Pain Loc --      Pain Edu? --      Excl. in [place]? --      Constitutional: Alert and oriented. Well appearing and in no distress. Eyes: Conjunctivae are normal. PERRL.  Normal extraocular movements. ENT   Head: Normocephalic and atraumatic.   Nose: No congestion/rhinnorhea.   Mouth/Throat: Mucous membranes are moist.   Neck: No stridor. Hematological/Lymphatic/Immunilogical: No cervical lymphadenopathy. Cardiovascular: Normal rate, regular rhythm. Normal and symmetric distal pulses are present in all extremities. No murmurs, rubs, or gallops. Respiratory: Normal respiratory effort without tachypnea nor retractions. Breath sounds are clear and equal bilaterally. No wheezes/rales/rhonchi. Gastrointestinal: Generalized tenderness to palpation. No distention. There is no CVA tenderness. Guaiac positive Genitourinary: deferred Musculoskeletal: Nontender with normal range of motion in all extremities. No joint effusions.  No lower extremity tenderness nor edema. Neurologic:  Normal speech and language. No gross focal neurologic deficits are appreciated. Speech is normal.  Skin:  Skin is warm, dry and intact. No rash noted. Psychiatric: Mood and affect are normal. Speech and behavior are normal. Patient exhibits appropriate insight and judgment.  ____________________________________________    LABS (pertinent positives/negatives)  Labs Reviewed  CBC - Abnormal; Notable for the following:    RBC 3.45 (*)    Hemoglobin 11.7 (*)    MCV 106.4 (*)    MCHC 31.9 (*)    RDW 20.8 (*)    Platelets 134 (*)    All other components within normal limits  COMPREHENSIVE METABOLIC PANEL - Abnormal; Notable for the following:    Chloride 95 (*)    Glucose, Bld 129 (*)    BUN 57 (*)    Creatinine, Ser 5.56 (*)    Total Protein 8.5 (*)    Alkaline Phosphatase 296 (*)    GFR calc non Af Amer 8 (*)    GFR calc Af Amer 9 (*)    Anion gap 16 (*)    All other components within normal limits  CULTURE, BLOOD (ROUTINE X 2)  CULTURE, BLOOD (ROUTINE X 2)  C DIFFICILE QUICK SCAN W PCR REFLEX  LACTIC ACID, PLASMA  TYPE AND SCREEN  ABO/RH      ____________________________________________   EKG  ED ECG REPORT I, Kamare Caspers, [place], the attending physician, personally viewed and interpreted this ECG.   Date: 02/27/2015  EKG Time: 0025  Rate: 105  Rhythm: Sinus tachycardia  Axis: None  Intervals: Normal  ST&T Change: None    Critical Care performed: 60 minutes ____________________________________________   INITIAL IMPRESSION / ASSESSMENT AND PLAN / ED COURSE  Pertinent labs & imaging results that were available during my care of the patient were reviewed by me and considered in my medical decision making (see chart for details).  History of physical exam consistent with sepsis most likely secondary to infectious diarrhea concern for C. difficile. Patient received 2 L normal saline IV bolus with improvement in blood pressure to 85/64. In addition patient received IV vancomycin and Zosyn. Patient discussed with Dr. Reece Levy for hospital admission for further evaluation and treatment.  ____________________________________________   FINAL CLINICAL IMPRESSION(S) / ED DIAGNOSES  Final diagnoses:  C. difficile enteritis  SIRS (systemic inflammatory response syndrome)      Gregor Hams, MD 03/12/2015 [phone]  Pain Score  0024 10     Pain Loc --      Pain Edu? --      Excl. in [place]? --      Constitutional: Alert and oriented. Well appearing and in no distress. Eyes: Conjunctivae are normal. PERRL.  Normal extraocular movements. ENT   Head: Normocephalic and atraumatic.   Nose: No congestion/rhinnorhea.   Mouth/Throat: Mucous membranes are moist.   Neck: No stridor. Hematological/Lymphatic/Immunilogical: No cervical lymphadenopathy. Cardiovascular: Normal rate, regular rhythm. Normal and symmetric distal pulses are present in all extremities. No murmurs, rubs, or gallops. Respiratory: Normal respiratory effort without tachypnea nor retractions. Breath sounds are clear and equal bilaterally. No wheezes/rales/rhonchi. Gastrointestinal: Generalized tenderness to palpation. No distention. There is no CVA tenderness. Guaiac positive Genitourinary: deferred Musculoskeletal: Nontender with normal range of motion in all extremities. No joint effusions.  No lower extremity tenderness nor edema. Neurologic:  Normal speech and language. No gross focal neurologic deficits are appreciated. Speech is normal.  Skin:  Skin is warm, dry and intact. No rash noted. Psychiatric: Mood and affect are normal. Speech and behavior are normal. Patient exhibits appropriate insight and judgment.  ____________________________________________    LABS (pertinent positives/negatives)  Labs Reviewed  CBC - Abnormal; Notable for the following:    RBC 3.45 (*)    Hemoglobin 11.7 (*)    MCV 106.4 (*)    MCHC 31.9 (*)    RDW 20.8 (*)    Platelets 134 (*)    All other components within normal limits  COMPREHENSIVE METABOLIC PANEL - Abnormal; Notable for the following:    Chloride 95 (*)    Glucose, Bld 129 (*)    BUN 57 (*)    Creatinine, Ser 5.56 (*)    Total Protein 8.5 (*)    Alkaline Phosphatase 296 (*)    GFR calc non Af Amer 8 (*)    GFR calc Af Amer 9 (*)    Anion gap 16 (*)    All other components within normal limits  CULTURE, BLOOD (ROUTINE X 2)  CULTURE, BLOOD (ROUTINE X 2)  C DIFFICILE QUICK SCAN W PCR REFLEX  LACTIC ACID, PLASMA  TYPE AND SCREEN  ABO/RH      ____________________________________________   EKG  ED ECG REPORT I, Kamare Caspers, [place], the attending physician, personally viewed and interpreted this ECG.   Date: 02/27/2015  EKG Time: 0025  Rate: 105  Rhythm: Sinus tachycardia  Axis: None  Intervals: Normal  ST&T Change: None    Critical Care performed: 60 minutes ____________________________________________   INITIAL IMPRESSION / ASSESSMENT AND PLAN / ED COURSE  Pertinent labs & imaging results that were available during my care of the patient were reviewed by me and considered in my medical decision making (see chart for details).  History of physical exam consistent with sepsis most likely secondary to infectious diarrhea concern for C. difficile. Patient received 2 L normal saline IV bolus with improvement in blood pressure to 85/64. In addition patient received IV vancomycin and Zosyn. Patient discussed with Dr. Reece Levy for hospital admission for further evaluation and treatment.  ____________________________________________   FINAL CLINICAL IMPRESSION(S) / ED DIAGNOSES  Final diagnoses:  C. difficile enteritis  SIRS (systemic inflammatory response syndrome)      Gregor Hams, MD 03/15/2015 [phone]  Pain Score  0024 10     Pain Loc --      Pain Edu? --      Excl. in [place]? --      Constitutional: Alert and oriented. Well appearing and in no distress. Eyes: Conjunctivae are normal. PERRL.  Normal extraocular movements. ENT   Head: Normocephalic and atraumatic.   Nose: No congestion/rhinnorhea.   Mouth/Throat: Mucous membranes are moist.   Neck: No stridor. Hematological/Lymphatic/Immunilogical: No cervical lymphadenopathy. Cardiovascular: Normal rate, regular rhythm. Normal and symmetric distal pulses are present in all extremities. No murmurs, rubs, or gallops. Respiratory: Normal respiratory effort without tachypnea nor retractions. Breath sounds are clear and equal bilaterally. No wheezes/rales/rhonchi. Gastrointestinal: Generalized tenderness to palpation. No distention. There is no CVA tenderness. Guaiac positive Genitourinary: deferred Musculoskeletal: Nontender with normal range of motion in all extremities. No joint effusions.  No lower extremity tenderness nor edema. Neurologic:  Normal speech and language. No gross focal neurologic deficits are appreciated. Speech is normal.  Skin:  Skin is warm, dry and intact. No rash noted. Psychiatric: Mood and affect are normal. Speech and behavior are normal. Patient exhibits appropriate insight and judgment.  ____________________________________________    LABS (pertinent positives/negatives)  Labs Reviewed  CBC - Abnormal; Notable for the following:    RBC 3.45 (*)    Hemoglobin 11.7 (*)    MCV 106.4 (*)    MCHC 31.9 (*)    RDW 20.8 (*)    Platelets 134 (*)    All other components within normal limits  COMPREHENSIVE METABOLIC PANEL - Abnormal; Notable for the following:    Chloride 95 (*)    Glucose, Bld 129 (*)    BUN 57 (*)    Creatinine, Ser 5.56 (*)    Total Protein 8.5 (*)    Alkaline Phosphatase 296 (*)    GFR calc non Af Amer 8 (*)    GFR calc Af Amer 9 (*)    Anion gap 16 (*)    All other components within normal limits  CULTURE, BLOOD (ROUTINE X 2)  CULTURE, BLOOD (ROUTINE X 2)  C DIFFICILE QUICK SCAN W PCR REFLEX  LACTIC ACID, PLASMA  TYPE AND SCREEN  ABO/RH      ____________________________________________   EKG  ED ECG REPORT I, Kamare Caspers, [place], the attending physician, personally viewed and interpreted this ECG.   Date: 02/27/2015  EKG Time: 0025  Rate: 105  Rhythm: Sinus tachycardia  Axis: None  Intervals: Normal  ST&T Change: None    Critical Care performed: 60 minutes ____________________________________________   INITIAL IMPRESSION / ASSESSMENT AND PLAN / ED COURSE  Pertinent labs & imaging results that were available during my care of the patient were reviewed by me and considered in my medical decision making (see chart for details).  History of physical exam consistent with sepsis most likely secondary to infectious diarrhea concern for C. difficile. Patient received 2 L normal saline IV bolus with improvement in blood pressure to 85/64. In addition patient received IV vancomycin and Zosyn. Patient discussed with Dr. Reece Levy for hospital admission for further evaluation and treatment.  ____________________________________________   FINAL CLINICAL IMPRESSION(S) / ED DIAGNOSES  Final diagnoses:  C. difficile enteritis  SIRS (systemic inflammatory response syndrome)      Gregor Hams, MD 03/15/2015 [phone]  Pain Score  0024 10     Pain Loc --      Pain Edu? --      Excl. in [place]? --      Constitutional: Alert and oriented. Well appearing and in no distress. Eyes: Conjunctivae are normal. PERRL.  Normal extraocular movements. ENT   Head: Normocephalic and atraumatic.   Nose: No congestion/rhinnorhea.   Mouth/Throat: Mucous membranes are moist.   Neck: No stridor. Hematological/Lymphatic/Immunilogical: No cervical lymphadenopathy. Cardiovascular: Normal rate, regular rhythm. Normal and symmetric distal pulses are present in all extremities. No murmurs, rubs, or gallops. Respiratory: Normal respiratory effort without tachypnea nor retractions. Breath sounds are clear and equal bilaterally. No wheezes/rales/rhonchi. Gastrointestinal: Generalized tenderness to palpation. No distention. There is no CVA tenderness. Guaiac positive Genitourinary: deferred Musculoskeletal: Nontender with normal range of motion in all extremities. No joint effusions.  No lower extremity tenderness nor edema. Neurologic:  Normal speech and language. No gross focal neurologic deficits are appreciated. Speech is normal.  Skin:  Skin is warm, dry and intact. No rash noted. Psychiatric: Mood and affect are normal. Speech and behavior are normal. Patient exhibits appropriate insight and judgment.  ____________________________________________    LABS (pertinent positives/negatives)  Labs Reviewed  CBC - Abnormal; Notable for the following:    RBC 3.45 (*)    Hemoglobin 11.7 (*)    MCV 106.4 (*)    MCHC 31.9 (*)    RDW 20.8 (*)    Platelets 134 (*)    All other components within normal limits  COMPREHENSIVE METABOLIC PANEL - Abnormal; Notable for the following:    Chloride 95 (*)    Glucose, Bld 129 (*)    BUN 57 (*)    Creatinine, Ser 5.56 (*)    Total Protein 8.5 (*)    Alkaline Phosphatase 296 (*)    GFR calc non Af Amer 8 (*)    GFR calc Af Amer 9 (*)    Anion gap 16 (*)    All other components within normal limits  CULTURE, BLOOD (ROUTINE X 2)  CULTURE, BLOOD (ROUTINE X 2)  C DIFFICILE QUICK SCAN W PCR REFLEX  LACTIC ACID, PLASMA  TYPE AND SCREEN  ABO/RH      ____________________________________________   EKG  ED ECG REPORT I, Kamare Caspers, [place], the attending physician, personally viewed and interpreted this ECG.   Date: 02/27/2015  EKG Time: 0025  Rate: 105  Rhythm: Sinus tachycardia  Axis: None  Intervals: Normal  ST&T Change: None    Critical Care performed: 60 minutes ____________________________________________   INITIAL IMPRESSION / ASSESSMENT AND PLAN / ED COURSE  Pertinent labs & imaging results that were available during my care of the patient were reviewed by me and considered in my medical decision making (see chart for details).  History of physical exam consistent with sepsis most likely secondary to infectious diarrhea concern for C. difficile. Patient received 2 L normal saline IV bolus with improvement in blood pressure to 85/64. In addition patient received IV vancomycin and Zosyn. Patient discussed with Dr. Reece Levy for hospital admission for further evaluation and treatment.  ____________________________________________   FINAL CLINICAL IMPRESSION(S) / ED DIAGNOSES  Final diagnoses:  C. difficile enteritis  SIRS (systemic inflammatory response syndrome)      Gregor Hams, MD 03/12/2015 [phone]

## 2015-02-17 NOTE — Progress Notes (Addendum)
Upon completion of report from the night shift rn, Sharyn Lull, pts bp was very low (38/24).  Levophed was increased.  Bp retaken.  Levophed increased again.  Left room to check on another pt.  Heard monitor alarming from pt's room and immediately returned to pt's bedside.  At this point, pt's respirations had ceased and code was called.  Chest compressions were started immediately as help soon arrived. See code sheet for further details.

## 2015-02-17 NOTE — ED Notes (Signed)
Bilateral lower leg +2 pitting edema noticed.

## 2015-02-17 NOTE — Progress Notes (Signed)
*  PRELIMINARY RESULTS* Echocardiogram 2D Echocardiogram has been performed.  [name] 03/14/2015, 2:44 PM

## 2015-02-17 NOTE — Progress Notes (Signed)
Pt arrived to the unit lethargic arouses to painful stimuli. Pt BP low contacted Dr. Reece Levy for  Levophed order received with the okay to start in peripheral and consult surgery in am for central line placement.

## 2015-02-18 DIAGNOSIS — N186 End stage renal disease: Secondary | ICD-10-CM

## 2015-02-18 DIAGNOSIS — R6521 Severe sepsis with septic shock: Secondary | ICD-10-CM

## 2015-02-18 DIAGNOSIS — Z515 Encounter for palliative care: Secondary | ICD-10-CM

## 2015-02-18 DIAGNOSIS — I469 Cardiac arrest, cause unspecified: Secondary | ICD-10-CM

## 2015-02-18 DIAGNOSIS — Z66 Do not resuscitate: Secondary | ICD-10-CM

## 2015-02-18 DIAGNOSIS — A419 Sepsis, unspecified organism: Secondary | ICD-10-CM

## 2015-02-18 DIAGNOSIS — I120 Hypertensive chronic kidney disease with stage 5 chronic kidney disease or end stage renal disease: Secondary | ICD-10-CM

## 2015-02-18 LAB — BLOOD GAS, ARTERIAL
Acid-base deficit: 6.4 mmol/L — ABNORMAL HIGH (ref 0.0–2.0)
Allens test (pass/fail): POSITIVE — AB
BICARBONATE: 25.1 meq/L (ref 21.0–28.0)
FIO2: 0.99
O2 Saturation: 85.5 %
PATIENT TEMPERATURE: 37
PCO2 ART: 81 mmHg — AB (ref 32.0–48.0)
pH, Arterial: 7.1 — CL (ref 7.350–7.450)
pO2, Arterial: 69 mmHg — ABNORMAL LOW (ref 83.0–108.0)

## 2015-02-22 LAB — CULTURE, BLOOD (ROUTINE X 2)
CULTURE: NO GROWTH
CULTURE: NO GROWTH

## 2015-02-25 IMAGING — CR DG CHEST 1V PORT
1 series · 1 of 1 positions shown · non-contrast
Comparison: none

REASON FOR EXAM: Chest Pain
COMMENTS:

[ap]
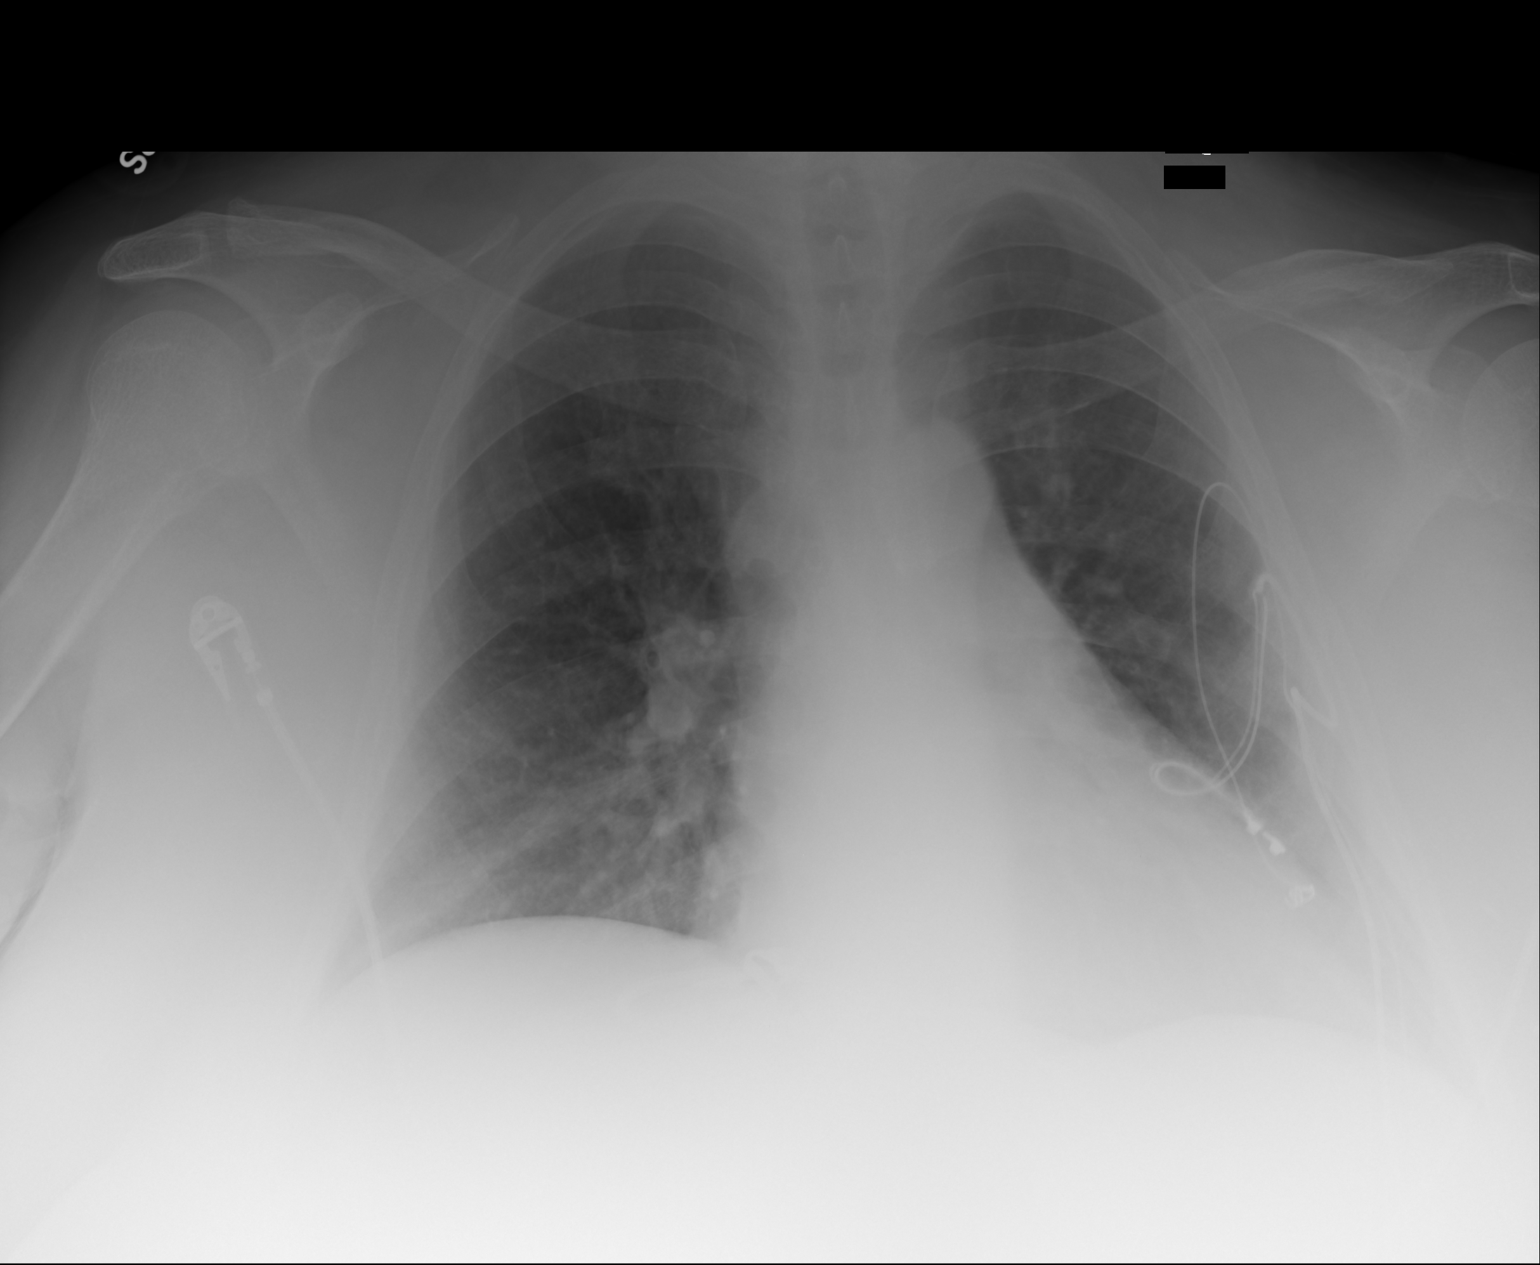

[1 of 1 positions shown; findings below may reference images not displayed]

PROCEDURE:     DXR - DXR PORTABLE CHEST SINGLE VIEW  - February 24, 2013 [DATE]

RESULT:     Comparison is mmade to the study February 04, 2013.

The cardiac silhouette is mildly enlarged. The central pulmonary vascularity
is normal in appearance. There is no pleural effusion or alveolar
infiltrate. There are epicardial pacing leads present.
IMPRESSION: There is no evidence of pneumonia. There is enlargement of
the cardiac silhouette without definite pulmonary edema. A followup PA and
lateral chest x-ray would be of value in further assessing the pulmonary
interstitium.

[REDACTED]

## 2015-03-03 LAB — TYPE AND SCREEN
ABO/RH(D): A POS
ANTIBODY SCREEN: POSITIVE
UNIT DIVISION: 0
Unit division: 0

## 2015-03-17 NOTE — Progress Notes (Signed)
Subjective:  Patient intubated sedated after cardiopulmonary arrest.  Objective:  Vital Signs in the last 24 hours: Temp:  [100 F (37.8 C)-103.2 F (39.6 C)] 101.8 F (38.8 C) (08/05 0736) Pulse Rate:  [25-60] 60 (08/05 0955) Resp:  [20-28] 20 (08/05 1001) BP: (36-67)/(21-39) 41/28 mmHg (08/05 0945) SpO2:  [78 %-100 %] 99 % (08/05 0955) FiO2 (%):  [60 %-100 %] 60 % (08/05 0757) Weight:  [153 kg (337 lb 4.9 oz)] 153 kg (337 lb 4.9 oz) (08/05 1015)  Intake/Output from previous day: 08/04 0701 - 08/05 0700 In: 7688.4 [I.V.:7688.4] Out: 0  Intake/Output from this shift: Total I/O In: 450 [I.V.:450] Out: -   Physical Exam: General appearance: morbidly obese and toxic Neck: no adenopathy, no carotid bruit, no JVD, supple, symmetrical, trachea midline and thyroid not enlarged, symmetric, no tenderness/mass/nodules Lungs: clear to auscultation bilaterally Heart: regular rate and rhythm and S3 present Abdomen: soft, non-tender; bowel sounds normal; no masses,  no organomegaly Extremities: edema Diffuse lower extremity Pulses: 2+ and symmetric Skin: Skin color, texture, turgor normal. No rashes or lesions Neurologic: Mental status: Alert, oriented, thought content appropriate, Sedated unresponsive  Lab Results:  Recent Labs  02/23/2015 0033 03/05/2015 0702  WBC 7.4 11.9*  HGB 11.7* 10.6*  PLT 134* 108*    Recent Labs  03/01/2015 0702 03/15/2015 1314  NA 140 144  K 4.5 3.6  CL 102 83*  CO2 20* 40*  GLUCOSE 94 131*  BUN 56* 57*  CREATININE 5.97* 5.34*   No results for input(s): TROPONINI in the last 72 hours.  Invalid input(s): CK, MB Hepatic Function Panel  Recent Labs  02/28/2015 1314  PROT 5.5*  ALBUMIN 2.7*  AST 244*  ALT 125*  ALKPHOS 168*  BILITOT 0.8   No results for input(s): CHOL in the last 72 hours. No results for input(s): PROTIME in the last 72 hours.  Imaging: Imaging results have been reviewed  Cardiac Studies:  Assessment/Plan:   Arrhythmia Atrial Fibrillation Cardiomyopathy CHF Edema Hypotension/Shock Shortness of Breath   asthma  diarrhea  septic shock  metabolic acidosis  respiratory  Failure . PLAN  intubated sedated continue  ICU support  continue ventilatory support  agree with broad-spectrum antibiotics for sepsis  continue pressors for hypovolemic shock  continue to treat diarrhea  the permanent pacemaker appears to be functioning well continue therapy  systemic inflammatory response syndrome continue pulmonary input and therapy  cardiopulmonary arrest continue critical care support  continue dialysis therapy for and states renal disease  continue Cardiology input do not recommend cardiac catheterization at this point  DVT prophylaxis   LOS: 1 day    [name] D. 03/03/2015, 12:00 PM

## 2015-03-17 NOTE — Consult Note (Signed)
 Palliative Medicine Inpatient Consult Follow Up Note   Name: [name] Date: Mar 16, 2015 MRN: [id number]  DOB: Mar 26, 1956  Referring Physician: Aldean Jewett, MD  Palliative Care consult requested for this 59 y.o. female for goals of medical therapy in patient with who is s/p cardiac/ resp arrest yesterday am due to presumed sepsis. She is a chronically ill 58 yr old woman who was admitted from the nursing home where she lives for abdominal pain and diarrhea. She is felt to be septic. She has ESRD on HD, Developmental Delay and mental retardation, sleep apnea, obesity, Depression, asthma, Seizures, DJD, history of CHF (type not clear), a history of arrhythmias now with a pacemaker, h/o PUD, constipation (normally), HTN, PVD, and history of numerous infections   TODAY'S EVENTS: I arrived to find that patient was still technically alive, but her SBP was still in the 40's despite 3 pressors and she had a Glasgow Coma Score that was extremely low.  Her heart was still paced and vent was providing artificial respirations.  Her ability to survive for long or recover was felt to be nil.  I spoke at length with the patient's friend (she was married to a brother of pt and they were very close). She had been here all night.  Barbaraann Rondo, patient's brother, had to go take care of some things at his workplace and she was the one who had agreed to stay bedside.  Her parents were not here yet.  I spoke about the patient's need to be given permission to go on and the medical facts that indicated to [place] that there was no chance of meaningful recovery.  The patient and family believe in God and a life after death, and they were apparently coming to terms with needing to let her go.  Just after I left the room and went to enter this note, the patient's heart rate went to zero as did her blood pressure.  She was examined by me, and it was noted that she had no ventricular beats on tele --and no blood pressure.  No  pulses and no pupillary reactions.    She was pronounced dead at 9:58 am.   Family was still present and notified.  I tried to call the parents but was only able to leave a message on the phone.  Barbaraann Rondo was being called by the family that was present here.  I let Dr. Mortimer Fries know about her demise.    Cause of death seems to have been sepsis complicated by her many chronic medical conditions and severe metabolic acidosis.   Attending will complete Death Cert, however.     REVIEW OF SYSTEMS:  Patient is not able to provide ROS  CODE STATUS: DNR   PAST MEDICAL HISTORY: Past Medical History  Diagnosis Date  . Renal insufficiency   . Mental retardation   . Sleep apnea   . End-stage renal disease on hemodialysis   . Tuberous sclerosis   . Obesity   . GERD (gastroesophageal reflux disease)   . Depression   . Mental retardation   . Sleep apnea     on c-pap  . Hx MRSA infection   . Obesity   . Complication of anesthesia     "sometimes I don't wake up on time; depends on how much med"  . Asthma   . 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 Osteoarthritis   . Angina   . CHF (congestive heart failure)   . Shortness of breath     "  all the time"  . Pneumonia ? 2006  . Renal failure   . ESRD (end stage renal disease) on dialysis 05/25/11    Tu, Th, Sa  . Blood transfusion   . Anemia   . Stomach ulcer   . Constipated   . Seizures     last seizure 05/21/11  . Pacemaker     AV paced  . Hypertension     PAST SURGICAL HISTORY:  Past Surgical History  Procedure Laterality Date  . Uterine tumor  05/25/11    pt denies this history  . Nephrectomy  01/08/1994; 01/15/2006    left; right  . Shunt in arm for dialysis      right   . Thrombectomy  05/2010    shunt in my right arm  . Cholecystectomy  07/02/1990  . Tonsillectomy  03/03/1978  . Avgg removal  06-26-11    Right arm AVG removed.  . Pacemaker placement    . Vas ins tunn cv cath 5 or older ([facility] hx)      both legs  . Av fistula placement      L  .  Arteriovenous graft placement      R arm  . Peripheral vascular catheterization Left 11/19/2014    Procedure: Thrombectomy;  Surgeon: Katha Cabal, MD;  Location: [facility];  Service: Cardiovascular;  Laterality: Left;  . Peripheral vascular catheterization Right 11/19/2014    Procedure: A/V Shuntogram/Fistulagram;  Surgeon: Katha Cabal, MD;  Location: [facility];  Service: Cardiovascular;  Laterality: Right;  . Peripheral vascular catheterization Left 11/17/2014    Procedure: Dialysis/Perma Catheter Insertion;  Surgeon: Katha Cabal, MD;  Location: [facility];  Service: Cardiovascular;  Laterality: Left;    Vital Signs: BP 43/26 mmHg  Pulse 60  Temp(Src) 101.8 F (38.8 C) (Axillary)  Resp 20  Ht 5\' 7"  (1.702 m)  Wt 153 kg (337 lb 4.9 oz)  BMI 52.82 kg/m2  SpO2 96% Filed Weights   03/04/2015 0021 03/02/2015 0500 03-11-15 0500  Weight: 104.327 kg (230 lb) 145.6 kg (320 lb 15.8 oz) 153 kg (337 lb 4.9 oz)    Estimated body mass index is 52.82 kg/(m^2) as calculated from the following:   Height as of this encounter: 5\' 7"  (1.702 m).   Weight as of this encounter: 153 kg (337 lb 4.9 oz).  PHYSICAL EXAM: Prior to her death: She was completely unresponsive, with only rare pupillary reactions and GCS <3 Intubated Not sedated Heart with very distant regular HS Lungs with only sounds of vent-induced air in lungs Abd is quiet but soft Ext are mottled and cool  LABS: CBC:    Component Value Date/Time   WBC 11.9* 03/01/2015 0702   WBC 3.8 10/21/2014 0942   HGB 10.6* 03/06/2015 0702   HGB 9.5* 10/21/2014 0942   HCT 34.9* 03/06/2015 0702   HCT 30.1* 10/21/2014 0942   PLT 108* 02/14/2015 0702   PLT 107* 10/21/2014 0942   MCV 110.3* 03/15/2015 0702   MCV 103* 10/21/2014 0942   NEUTROABS 9.5* 12/16/2014 1859   NEUTROABS 2.4 10/21/2014 0942   LYMPHSABS 0.8 12/16/2014 1859   LYMPHSABS 1.2 10/21/2014 0942   MONOABS 0.5 12/16/2014 1859   MONOABS  0.2 10/21/2014 0942   EOSABS 0.0 12/16/2014 1859   EOSABS 0.1 10/21/2014 0942   BASOSABS 0.0 12/16/2014 1859   BASOSABS 0.0 10/21/2014 0942   Comprehensive Metabolic Panel:    Component Value Date/Time   NA  144 02/19/2015 1314   NA 137 10/21/2014 0941   K 3.6 02/26/2015 1314   K 4.0 10/21/2014 0941   CL 83* 03/10/2015 1314   CL 96* 10/21/2014 0941   CO2 40*  1314   CO2 28 10/21/2014 0941   BUN 57* 03/11/2015 1314   BUN 61* 10/21/2014 0941   CREATININE 5.34* 02/16/2015 1314   CREATININE 6.42* 10/21/2014 0941   GLUCOSE 131* 03/05/2015 1314   GLUCOSE 121* 10/21/2014 0941   CALCIUM 7.0* 02/28/2015 1314   CALCIUM 8.7* 10/21/2014 0941   CALCIUM 10.2 02/09/2010 1028   AST 244* 02/28/2015 1314   AST 20 10/19/2014 1419   ALT 125* 03/08/2015 1314   ALT 15 10/19/2014 1419   ALKPHOS 168* 03/02/2015 1314   ALKPHOS 277* 10/19/2014 1419   BILITOT 0.8 02/23/2015 1314   BILITOT 0.3 10/19/2014 1419   PROT 5.5* 03/01/2015 1314   PROT 7.9 10/19/2014 1419   ALBUMIN 2.7* 03/08/2015 1314   ALBUMIN 3.5 10/21/2014 0941    IMPRESSION: Sepsis S/p cardiac arrest yesterday am. Patient died today at 9:58 --after I had seen pt and after I had talked with family member from about 9:20 am till just prior to her demise.   SEE ABOVE FOR PLAN:   REFERRALS TO BE ORDERED:  None   More than 50% of the visit was spent in counseling/coordination of care: YES  Time Spent:  45

## 2015-03-17 NOTE — Progress Notes (Signed)
Both pupils 4, non responsive, no gag. B/p 46/31, sister-in-law at bedside, updated.

## 2015-03-17 NOTE — Progress Notes (Signed)
Patient's heart  Rate dropped  to 0 and no blood pressure. Dr. Megan Salon present and her family at bedside. Dr. Megan Salon present at time of death. No heartbeat present upon auscultation by this RN and Darlyn Chamber, Warehouse manager. Pacemaker deactivated with a magnet.

## 2015-03-17 NOTE — Progress Notes (Signed)
Call to [facility] to notify of GCS 3 or <

## 2015-03-17 NOTE — Progress Notes (Signed)
02/16/2015 1136  Clinical Encounter Type  Visited With Patient and family together  Visit Type Spiritual support;Death  Referral From Nurse  Consult/Referral To Chaplain  Spiritual Encounters  Spiritual Needs Emotional;Grief support  Stress Factors  Family Stress Factors Loss  Chaplain was paged to the unit to offer spiritual support and a compassionate presence to family after the patient died. Chaplain Hazelyn Kallen A. Natascha Edmonds Ext. [phone].

## 2015-03-17 NOTE — Progress Notes (Signed)
Patient remains unresponsive on AC ventilation, no sedation. Bilateral pupils 4, no gag with ETT suctioning. Tmax 103.2  SR/St on cardiac monitor, v-paced. SBP 40-50s on vasopressor x 3 (Levophed 80 mcg, Vasopressin .03, and Dopamine 5 mcg). Na Bicarb 125 ml/hr. IVF infusing through left femoral trialysis catheter. Sister-in-law at bedside throughout night, updated on patient status.

## 2015-03-17 NOTE — Progress Notes (Signed)
 [facility]  ROUNDING NOTE   Subjective:   Sister in law at bedside.  Currently on three vasopressors: norepinephrine, vasopressin and dopamine Bicarb gtt dropped to 69ml/hr  Not on sedation  Objective:  Vital signs in last 24 hours:  Temp:  [100 F (37.8 C)-103.2 F (39.6 C)] 101.8 F (38.8 C) (08/05 0736) Pulse Rate:  [25-59] 58 (08/05 0800) Resp:  [20-28] 20 (08/05 0800) BP: (44-67)/(21-39) 44/28 mmHg (08/05 0800) SpO2:  [78 %-100 %] 97 % (08/05 0800) FiO2 (%):  [60 %-100 %] 60 % (08/05 0757) Weight:  [153 kg (337 lb 4.9 oz)] 153 kg (337 lb 4.9 oz) (08/05 0500)  Weight change: 48.673 kg (107 lb 4.9 oz) Filed Weights   02/16/2015 0021 03/09/2015 0500 03-18-2015 0500  Weight: 104.327 kg (230 lb) 145.6 kg (320 lb 15.8 oz) 153 kg (337 lb 4.9 oz)    Intake/Output: I/O last 3 completed shifts: In: 7688.4 [I.V.:7688.4] Out: 0    Intake/Output this shift:  Total I/O In: 225 [I.V.:225] Out: -   Physical Exam: General: Critically ill  Head: +ETT,   Eyes: Eyes closed, pupils not reactive to light  Neck: Right peripheral shoulder IV  Lungs:  PRVC FiO2 60%   Heart: Regular rate and rhythm  Abdomen:  Obese, midline wound healing  Extremities: 1+ edema.  Neurologic: Off sedation, not responding to painful stimuli  Skin: Midline abdominal incisional wound  Access: Left AVG with no bruit nor thrill, left trialysis catheter Dr. Lucky Cowboy 8/4    Basic Metabolic Panel:  Recent Labs Lab 03/03/2015 0033  0702 03/08/2015 1314  NA 140 140 144  K 4.6 4.5 3.6  CL 95* 102 83*  CO2 29 20* 40*  GLUCOSE 129* 94 131*  BUN 57* 56* 57*  CREATININE 5.56* 5.97* 5.34*  CALCIUM 9.6 8.7* 7.0*  MG  --   --  1.6*  PHOS  --   --  3.9    Liver Function Tests:  Recent Labs Lab 02/21/2015 0033 03/14/2015 1314  AST 29 244*  ALT 14 125*  ALKPHOS 296* 168*  BILITOT 1.0 0.8  PROT 8.5* 5.5*  ALBUMIN 4.2 2.7*   No results for input(s): LIPASE, AMYLASE in the last 168  hours. No results for input(s): AMMONIA in the last 168 hours.  CBC:  Recent Labs Lab 03/11/2015 0033 02/21/2015 0702  WBC 7.4 11.9*  HGB 11.7* 10.6*  HCT 36.7 34.9*  MCV 106.4* 110.3*  PLT 134* 108*    Cardiac Enzymes: No results for input(s): CKTOTAL, CKMB, CKMBINDEX, TROPONINI in the last 168 hours.  BNP: Invalid input(s): POCBNP  CBG:  Recent Labs Lab 02/26/2015 0522  GLUCAP 69    Microbiology: Results for orders placed or performed during the hospital encounter of 02/24/2015  Blood culture (routine x 2)     Status: None (Preliminary result)   Collection Time: 02/22/2015 12:33 AM  Result Value Ref Range Status   Specimen Description BLOOD  Final   Special Requests BOTTLES DRAWN AEROBIC AND ANAEROBIC [place]  Final   Culture NO GROWTH 1 DAY  Final   Report Status PENDING  Incomplete  Blood culture (routine x 2)     Status: None (Preliminary result)   Collection Time: 02/17/15 12:33 AM  Result Value Ref Range Status   Specimen Description BLOOD  Final   Special Requests   Final    BOTTLES DRAWN AEROBIC AND ANAEROBIC 1CCANAEROBIC, 5CCAEROBIC   Culture NO GROWTH 1 DAY  Final   Report Status  PENDING  Incomplete  C difficile quick scan w PCR reflex     Status: Abnormal   Collection Time: 03/08/2015  1:53 AM  Result Value Ref Range Status   C Diff antigen POSITIVE (A) NEGATIVE Final   C Diff toxin NEGATIVE NEGATIVE Final   C Diff interpretation   Final    Negative for toxigenic C. difficile. Toxin gene and active toxin production not detected. May be a nontoxigenic strain of C. difficile bacteria present, lacking the ability to produce toxin.  Clostridium Difficile by PCR     Status: None   Collection Time: 03/05/2015  1:53 AM  Result Value Ref Range Status   Toxigenic C Difficile by pcr NEGATIVE NEGATIVE Final  MRSA PCR Screening     Status: None   Collection Time: 03/14/2015  5:20 AM  Result Value Ref Range Status   MRSA by PCR NEGATIVE NEGATIVE Final    Comment:        The  GeneXpert MRSA Assay (FDA approved for NASAL specimens only), is one component of a comprehensive MRSA colonization surveillance program. It is not intended to diagnose MRSA infection nor to guide or monitor treatment for MRSA infections.     Coagulation Studies: No results for input(s): LABPROT, INR in the last 72 hours.  Urinalysis: No results for input(s): COLORURINE, LABSPEC, PHURINE, GLUCOSEU, HGBUR, BILIRUBINUR, KETONESUR, PROTEINUR, UROBILINOGEN, NITRITE, LEUKOCYTESUR in the last 72 hours.  Invalid input(s): APPERANCEUR    Imaging: Dg Chest 1 View  03/16/2015   CLINICAL DATA:  Diarrhea.  Incontinence.  Dyspnea.  EXAM: CHEST  1 VIEW  COMPARISON:  12/16/2014  FINDINGS: There is moderate unchanged cardiomegaly. No large effusion. No focal airspace consolidation. Pulmonary vasculature is normal.  IMPRESSION: Cardiomegaly.  No acute findings.   Electronically Signed   By: Andreas Newport M.D.   On: 03/14/2015 02:55   Dg Chest Port 1 View  02/14/2015   CLINICAL DATA:  Intubation. History of asthma, CHF, and shortness of breath.  EXAM: PORTABLE CHEST - 1 VIEW  COMPARISON:  03/12/2015  FINDINGS: The patient is rotated to the right, limiting evaluation of the mediastinal structures although the heart again appears enlarged. An endotracheal tube has been placed and terminates 2-2.5 cm above the carina. There is mild elevation of the right hemidiaphragm. Left lung is partially obscured by an external pad and wires. No gross airspace consolidation, overt edema, sizable pleural effusion, or pneumothorax is identified.  IMPRESSION: 1. Endotracheal tube 2-2.5 cm above the carina. 2. Partially limited evaluation of the mediastinum and lungs due to patient rotation and external support devices. No acute airspace disease identified.   Electronically Signed   By: Logan Bores   On: 03/13/2015 11:04     Medications:   . DOPamine 5 mcg/kg/min (03-08-2015 0734)  . norepinephrine (LEVOPHED) Adult  infusion 80 mcg/min (Mar 08, 2015 0600)  .  sodium bicarbonate 150 mEq in sterile water 1000 mL infusion 125 mL/hr at Mar 08, 2015 0600  . vasopressin (PITRESSIN) infusion - *FOR SHOCK* 0.03 Units/min (03-08-15 0733)   . antiseptic oral rinse  7 mL Mouth Rinse QID  . budesonide (PULMICORT) nebulizer solution  0.5 mg Nebulization BID  . chlorhexidine gluconate  15 mL Mouth Rinse BID  . heparin  5,000 Units Subcutaneous 3 times per day  . hydrocortisone  25 mg Rectal BID  . hydrocortisone sod succinate (SOLU-CORTEF) inj  50 mg Intravenous Q6H  . ipratropium-albuterol  3 mL Nebulization Q6H  . levETIRAcetam  500 mg Intravenous Q12H  .  piperacillin-tazobactam (ZOSYN)  IV  3.375 g Intravenous Q12H  . sodium chloride  10-40 mL Intracatheter Q12H  . sodium chloride  3 mL Intravenous Q12H   acetaminophen **OR** acetaminophen, morphine injection, ondansetron **OR** ondansetron (ZOFRAN) IV, phenylephrine-shark liver oil-mineral oil-petrolatum, sodium chloride, sodium chloride, zolpidem  Assessment/ Plan:  [name] is a 59 y.o. white female with ESRD on hemodialysis TTS, bilateral nephrectomies, seizure disorder, morbid obesity, tubuerous sclerosis, hypotension, depression, asthma and secondary hyperparathyroidism, seizure disorder admit 4/19, OSA on CPAP  [facility]//[facility]// TTS  1. ESRD: Critically ill. Very poor prognosis.  - not a candidate for CRRT at this time. Family made aware  2. Hypotension: I95.9 chronically with low BP. Now on high dose norepinephrine, vasopressin and dopamin  - Home PO fludrcortisone, hydrocortisone, and midodrine. - possible septic and hypovolemic shock.   3. Anemia of Kidney Disease: hemoglobin 10.6. With iron defciency anemia -  Patient getting micera as outpatient.  - monitor chronic but stable thrombocytopenia.   4. SHPTH:  - renvela and calcium acetate with meals as outpatient.  - She takes calcitriol on dialysis days.     LOS: [place]are, Gem 08/19/20168:46 AM

## 2015-03-17 NOTE — Progress Notes (Addendum)
Clinical Education officer, museum (CSW) received consult that patient is from [facility]. Per RN and palliative note patient has passed away this morning. CSW contacted [facility] admissions coordinator at [facility] and made her aware of above. Per Neoma Laming she received a call an hour ago about patient's demise. Please reconsult if future social work needs arise. CSW signing off.   Blima Rich, [place] [phone]

## 2015-03-17 NOTE — Progress Notes (Signed)
Patient continues on dopamine at 5 mcg/kg/min, vasopressin 0.03 units/ min, levophed 80 mcg/ min. Extremities are mottled, she is non-responsive on ventilator.  Pacing at 60 on the monitor and blood pressure is 44/28. Dr. Alva Garnet in to see patient. He does not want drips titrated. Patient's brother in for visit. Reports he is headed to work but to call him if there is a change.

## 2015-03-17 NOTE — Care Management (Signed)
Patient expired this morning. I have notified [facility] dialysis liaison of patient demise.

## 2015-03-17 NOTE — Progress Notes (Signed)
 Comatose off all sedation/analgesia Pupils 5-6 mm and minimally reactive Corneals absent Doll's eyes incomplete  Filed Vitals:   Mar 18, 2015 0700 03/18/15 0736 March 18, 2015 0757 03-18-15 0800  BP: 45/32   44/28  Pulse: 59   58  Temp:  101.8 F (38.8 C)    TempSrc:  Axillary    Resp: 20   20  Height:      Weight:      SpO2: 99%  97% 97%   Comatose NCAT Pupils equal @ 5-6 mm, minimally reactive JVP not visualized Chest clear anteriorly  Obese, soft, diminished to absent BS Ext cool without pretibial edema  BMET    Component Value Date/Time   NA 144 02/14/2015 1314   NA 137 10/21/2014 0941   K 3.6 02/19/2015 1314   K 4.0 10/21/2014 0941   CL 83* 02/16/2015 1314   CL 96* 10/21/2014 0941   CO2 40* 03/12/2015 1314   CO2 28 10/21/2014 0941   GLUCOSE 131* 03/03/2015 1314   GLUCOSE 121* 10/21/2014 0941   BUN 57* 03/13/2015 1314   BUN 61* 10/21/2014 0941   CREATININE 5.34* 03/15/2015 1314   CREATININE 6.42* 10/21/2014 0941   CALCIUM 7.0* 03/03/2015 1314   CALCIUM 8.7* 10/21/2014 0941   CALCIUM 10.2 02/09/2010 1028   GFRNONAA 8* 02/19/2015 1314   GFRNONAA 7* 10/21/2014 0941   GFRAA 9* 03/15/2015 1314   GFRAA 8* 10/21/2014 0941    CBC    Component Value Date/Time   WBC 11.9* 02/16/2015 0702   WBC 3.8 10/21/2014 0942   RBC 3.16* 03/01/2015 0702   RBC 2.92* 10/21/2014 0942   RBC 2.87* 12/18/2009 2249   HGB 10.6* 03/03/2015 0702   HGB 9.5* 10/21/2014 0942   HCT 34.9* 02/25/2015 0702   HCT 30.1* 10/21/2014 0942   PLT 108* 02/27/2015 0702   PLT 107* 10/21/2014 0942   MCV 110.3* 02/16/2015 0702   MCV 103* 10/21/2014 0942   MCH 33.5 03/04/2015 0702   MCH 32.5 10/21/2014 0942   MCHC 30.3* 03/04/2015 0702   MCHC 31.5* 10/21/2014 0942   RDW 20.6*  0702   RDW 19.6* 10/21/2014 0942   LYMPHSABS 0.8 12/16/2014 1859   LYMPHSABS 1.2 10/21/2014 0942   MONOABS 0.5 12/16/2014 1859   MONOABS 0.2 10/21/2014 0942   EOSABS 0.0 12/16/2014 1859   EOSABS 0.1 10/21/2014 0942    BASOSABS 0.0 12/16/2014 1859   BASOSABS 0.0 10/21/2014 0942    No new CXR   IMPRESSION: Multiple severe chronic medical problems inc ESRD Severe baseline debilitation ESRD Severe sepsis/refractory shock despite max dose vasopressors Vent dep resp failure Metabolic acidosis - HCO3 now 40 on chem panel on HCO3 gtt Severe diarrhea - antigen positive, toxin negative Coma - likely due to profound shock Very poor prognosis - I don't see any way that she would be able to survive this catastrophic illness   PLAN/REC: There is nothing further that can be added to her current support Suggest focusing on her comfort and dignity as primary goals DNR appropriateNot a candidate for CRRT Would be reasonable to discontinue all agressive support if/when family is ready to do so I have turned the HCO3 gtt down  Merton Border, MD ; [facility] service Mobile [phone].  After 5:30 PM or weekends, call [phone]

## 2015-03-17 NOTE — Consult Note (Signed)
 Blood culture (routine x 2)     Status: None (Preliminary result)   Collection Time: 02/15/2015 12:33 AM  Result Value Ref Range   Specimen Description BLOOD    Special Requests BOTTLES DRAWN AEROBIC AND ANAEROBIC [place]    Culture NO GROWTH 1 DAY    Report Status PENDING   Blood culture (routine x 2)     Status: None (Preliminary result)   Collection Time: 03/03/2015 12:33 AM  Result Value Ref Range   Specimen Description BLOOD    Special Requests      BOTTLES DRAWN AEROBIC AND ANAEROBIC 1CCANAEROBIC, 5CCAEROBIC   Culture NO GROWTH 1 DAY    Report Status PENDING   Lactic acid, plasma     Status: Abnormal   Collection Time: 03/03/2015 12:33 AM  Result Value Ref Range   Lactic Acid, Venous 2.4 (HH) 0.5 - 2.0 mmol/L    Comment: CRITICAL RESULT CALLED TO, READ BACK BY AND VERIFIED WITH  ABO/Rh     Status: None   Collection Time: 02/22/2015 12:37 AM  Result Value Ref Range   ABO/RH(D) A POS   C difficile quick scan w PCR reflex     Status: Abnormal   Collection Time: 03/03/2015  1:53 AM  Result Value Ref Range   C Diff antigen POSITIVE (A) NEGATIVE   C Diff toxin NEGATIVE NEGATIVE   C Diff interpretation      Negative for toxigenic C. difficile. Toxin gene and active toxin production not detected. May be a nontoxigenic strain of C. difficile bacteria present, lacking the ability to produce toxin.  Clostridium Difficile by PCR     Status: None   Collection Time: 02/27/2015  1:53 AM  Result Value Ref Range   Toxigenic C Difficile by pcr NEGATIVE NEGATIVE  MRSA PCR Screening     Status: None   Collection Time: 02/14/2015  5:20 AM  Result Value Ref Range   MRSA by PCR NEGATIVE NEGATIVE    Comment:        The GeneXpert MRSA Assay (FDA approved for NASAL specimens only), is one  component of a comprehensive MRSA colonization surveillance program. It is not intended to diagnose MRSA infection nor to guide or monitor treatment for MRSA infections.   Glucose, capillary     Status: None   Collection Time: 02/24/2015  5:22 AM  Result Value Ref Range   Glucose-Capillary 94 65 - 99 mg/dL  CBC     Status: Abnormal   Collection Time: 02/16/2015  7:02 AM  Result Value Ref Range   WBC 11.9 (H) 3.6 - 11.0 K/uL   RBC 3.16 (L) 3.80 - 5.20 MIL/uL   Hemoglobin 10.6 (L) 12.0 - 16.0 g/dL    Comment: RESULT REPEATED AND VERIFIED   HCT 34.9 (L) 35.0 - 47.0 %    Comment: RESULT REPEATED AND VERIFIED   MCV 110.3 (H) 80.0 - 100.0 fL   MCH 33.5 26.0 - 34.0 pg   MCHC 30.3 (L) 32.0 - 36.0 g/dL   RDW 20.6 (H) 11.5 - 14.5 %   Platelets 108 (L) 150 - 440 K/uL  Basic metabolic panel     Status: Abnormal   Collection Time: 03/14/2015  7:02 AM  Result Value Ref Range   Sodium 140 135 - 145 mmol/L   Potassium 4.5 3.5 - 5.1 mmol/L   Chloride 102 101 - 111 mmol/L   CO2 20 (L) 22 - 32 mmol/L   Glucose, Bld 94 65 - 99 mg/dL   BUN  Social History:  reports that she has never smoked. She has never used smokeless tobacco. She reports that she does not drink alcohol or use illicit drugs.  Allergies:  Allergies  Allergen Reactions  . Iodinated Diagnostic Agents Hives  . Iodine     Other reaction(s): Other (See Comments) Other Reaction: contrast-itching and flushing  . Methylprednisolone Hives  . Other Itching    Other reaction(s): Other (See Comments) Uncoded Allergy. Allergen: iv contrast dye, Other Reaction: flushing Arthritis medications Other reaction(s): Other (See Comments) Uncoded Allergy. Allergen: iv contrast dye, Other Reaction: flushing Arthritis medications  . Pollen Extract Hives  . Trimethadione     Other reaction(s): Other (See  Comments) Other Reaction: Other reaction  . Trazodone And Nefazodone Hives and Rash    Medications: I have reviewed the patient's current medications.  Results for orders placed or performed during the hospital encounter of 02/21/2015 (from the past 48 hour(s))  CBC     Status: Abnormal   Collection Time: 03/12/2015 12:33 AM  Result Value Ref Range   WBC 7.4 3.6 - 11.0 K/uL   RBC 3.45 (L) 3.80 - 5.20 MIL/uL   Hemoglobin 11.7 (L) 12.0 - 16.0 g/dL   HCT 36.7 35.0 - 47.0 %   MCV 106.4 (H) 80.0 - 100.0 fL   MCH 34.0 26.0 - 34.0 pg   MCHC 31.9 (L) 32.0 - 36.0 g/dL   RDW 20.8 (H) 11.5 - 14.5 %   Platelets 134 (L) 150 - 440 K/uL  Comprehensive metabolic panel     Status: Abnormal   Collection Time: 03/15/2015 12:33 AM  Result Value Ref Range   Sodium 140 135 - 145 mmol/L   Potassium 4.6 3.5 - 5.1 mmol/L   Chloride 95 (L) 101 - 111 mmol/L   CO2 29 22 - 32 mmol/L   Glucose, Bld 129 (H) 65 - 99 mg/dL   BUN 57 (H) 6 - 20 mg/dL   Creatinine, Ser 5.56 (H) 0.44 - 1.00 mg/dL   Calcium 9.6 8.9 - 10.3 mg/dL   Total Protein 8.5 (H) 6.5 - 8.1 g/dL   Albumin 4.2 3.5 - 5.0 g/dL   AST 29 15 - 41 U/L   ALT 14 14 - 54 U/L   Alkaline Phosphatase 296 (H) 38 - 126 U/L   Total Bilirubin 1.0 0.3 - 1.2 mg/dL   GFR calc non Af Amer 8 (L) >60 mL/min   GFR calc Af Amer 9 (L) >60 mL/min    Comment: (NOTE) The eGFR has been calculated using the CKD EPI equation. This calculation has not been validated in all clinical situations. eGFR's persistently <60 mL/min signify possible Chronic Kidney Disease.    Anion gap 16 (H) 5 - 15  Type and screen     Status: None (Preliminary result)   Collection Time: 02/28/2015 12:33 AM  Result Value Ref Range   ABO/RH(D) A POS    Antibody Screen POS    Sample Expiration     Antibody Identification ANTI K    Unit Number [id number]    Blood Component Type RED CELLS,LR    Unit division 00    Status of Unit ALLOCATED    Transfusion Status OK TO TRANSFUSE     Crossmatch Result COMPATIBLE    Unit Number [id number]    Blood Component Type RED CELLS,LR    Unit division 00    Status of Unit ALLOCATED    Transfusion Status OK TO TRANSFUSE    Crossmatch Result COMPATIBLE  Social History:  reports that she has never smoked. She has never used smokeless tobacco. She reports that she does not drink alcohol or use illicit drugs.  Allergies:  Allergies  Allergen Reactions  . Iodinated Diagnostic Agents Hives  . Iodine     Other reaction(s): Other (See Comments) Other Reaction: contrast-itching and flushing  . Methylprednisolone Hives  . Other Itching    Other reaction(s): Other (See Comments) Uncoded Allergy. Allergen: iv contrast dye, Other Reaction: flushing Arthritis medications Other reaction(s): Other (See Comments) Uncoded Allergy. Allergen: iv contrast dye, Other Reaction: flushing Arthritis medications  . Pollen Extract Hives  . Trimethadione     Other reaction(s): Other (See  Comments) Other Reaction: Other reaction  . Trazodone And Nefazodone Hives and Rash    Medications: I have reviewed the patient's current medications.  Results for orders placed or performed during the hospital encounter of 02/21/2015 (from the past 48 hour(s))  CBC     Status: Abnormal   Collection Time: 03/12/2015 12:33 AM  Result Value Ref Range   WBC 7.4 3.6 - 11.0 K/uL   RBC 3.45 (L) 3.80 - 5.20 MIL/uL   Hemoglobin 11.7 (L) 12.0 - 16.0 g/dL   HCT 36.7 35.0 - 47.0 %   MCV 106.4 (H) 80.0 - 100.0 fL   MCH 34.0 26.0 - 34.0 pg   MCHC 31.9 (L) 32.0 - 36.0 g/dL   RDW 20.8 (H) 11.5 - 14.5 %   Platelets 134 (L) 150 - 440 K/uL  Comprehensive metabolic panel     Status: Abnormal   Collection Time: 03/15/2015 12:33 AM  Result Value Ref Range   Sodium 140 135 - 145 mmol/L   Potassium 4.6 3.5 - 5.1 mmol/L   Chloride 95 (L) 101 - 111 mmol/L   CO2 29 22 - 32 mmol/L   Glucose, Bld 129 (H) 65 - 99 mg/dL   BUN 57 (H) 6 - 20 mg/dL   Creatinine, Ser 5.56 (H) 0.44 - 1.00 mg/dL   Calcium 9.6 8.9 - 10.3 mg/dL   Total Protein 8.5 (H) 6.5 - 8.1 g/dL   Albumin 4.2 3.5 - 5.0 g/dL   AST 29 15 - 41 U/L   ALT 14 14 - 54 U/L   Alkaline Phosphatase 296 (H) 38 - 126 U/L   Total Bilirubin 1.0 0.3 - 1.2 mg/dL   GFR calc non Af Amer 8 (L) >60 mL/min   GFR calc Af Amer 9 (L) >60 mL/min    Comment: (NOTE) The eGFR has been calculated using the CKD EPI equation. This calculation has not been validated in all clinical situations. eGFR's persistently <60 mL/min signify possible Chronic Kidney Disease.    Anion gap 16 (H) 5 - 15  Type and screen     Status: None (Preliminary result)   Collection Time: 02/28/2015 12:33 AM  Result Value Ref Range   ABO/RH(D) A POS    Antibody Screen POS    Sample Expiration     Antibody Identification ANTI K    Unit Number [id number]    Blood Component Type RED CELLS,LR    Unit division 00    Status of Unit ALLOCATED    Transfusion Status OK TO TRANSFUSE     Crossmatch Result COMPATIBLE    Unit Number [id number]    Blood Component Type RED CELLS,LR    Unit division 00    Status of Unit ALLOCATED    Transfusion Status OK TO TRANSFUSE    Crossmatch Result COMPATIBLE  Social History:  reports that she has never smoked. She has never used smokeless tobacco. She reports that she does not drink alcohol or use illicit drugs.  Allergies:  Allergies  Allergen Reactions  . Iodinated Diagnostic Agents Hives  . Iodine     Other reaction(s): Other (See Comments) Other Reaction: contrast-itching and flushing  . Methylprednisolone Hives  . Other Itching    Other reaction(s): Other (See Comments) Uncoded Allergy. Allergen: iv contrast dye, Other Reaction: flushing Arthritis medications Other reaction(s): Other (See Comments) Uncoded Allergy. Allergen: iv contrast dye, Other Reaction: flushing Arthritis medications  . Pollen Extract Hives  . Trimethadione     Other reaction(s): Other (See  Comments) Other Reaction: Other reaction  . Trazodone And Nefazodone Hives and Rash    Medications: I have reviewed the patient's current medications.  Results for orders placed or performed during the hospital encounter of 02/21/2015 (from the past 48 hour(s))  CBC     Status: Abnormal   Collection Time: 03/12/2015 12:33 AM  Result Value Ref Range   WBC 7.4 3.6 - 11.0 K/uL   RBC 3.45 (L) 3.80 - 5.20 MIL/uL   Hemoglobin 11.7 (L) 12.0 - 16.0 g/dL   HCT 36.7 35.0 - 47.0 %   MCV 106.4 (H) 80.0 - 100.0 fL   MCH 34.0 26.0 - 34.0 pg   MCHC 31.9 (L) 32.0 - 36.0 g/dL   RDW 20.8 (H) 11.5 - 14.5 %   Platelets 134 (L) 150 - 440 K/uL  Comprehensive metabolic panel     Status: Abnormal   Collection Time: 03/15/2015 12:33 AM  Result Value Ref Range   Sodium 140 135 - 145 mmol/L   Potassium 4.6 3.5 - 5.1 mmol/L   Chloride 95 (L) 101 - 111 mmol/L   CO2 29 22 - 32 mmol/L   Glucose, Bld 129 (H) 65 - 99 mg/dL   BUN 57 (H) 6 - 20 mg/dL   Creatinine, Ser 5.56 (H) 0.44 - 1.00 mg/dL   Calcium 9.6 8.9 - 10.3 mg/dL   Total Protein 8.5 (H) 6.5 - 8.1 g/dL   Albumin 4.2 3.5 - 5.0 g/dL   AST 29 15 - 41 U/L   ALT 14 14 - 54 U/L   Alkaline Phosphatase 296 (H) 38 - 126 U/L   Total Bilirubin 1.0 0.3 - 1.2 mg/dL   GFR calc non Af Amer 8 (L) >60 mL/min   GFR calc Af Amer 9 (L) >60 mL/min    Comment: (NOTE) The eGFR has been calculated using the CKD EPI equation. This calculation has not been validated in all clinical situations. eGFR's persistently <60 mL/min signify possible Chronic Kidney Disease.    Anion gap 16 (H) 5 - 15  Type and screen     Status: None (Preliminary result)   Collection Time: 02/28/2015 12:33 AM  Result Value Ref Range   ABO/RH(D) A POS    Antibody Screen POS    Sample Expiration     Antibody Identification ANTI K    Unit Number [id number]    Blood Component Type RED CELLS,LR    Unit division 00    Status of Unit ALLOCATED    Transfusion Status OK TO TRANSFUSE     Crossmatch Result COMPATIBLE    Unit Number [id number]    Blood Component Type RED CELLS,LR    Unit division 00    Status of Unit ALLOCATED    Transfusion Status OK TO TRANSFUSE    Crossmatch Result COMPATIBLE  Social History:  reports that she has never smoked. She has never used smokeless tobacco. She reports that she does not drink alcohol or use illicit drugs.  Allergies:  Allergies  Allergen Reactions  . Iodinated Diagnostic Agents Hives  . Iodine     Other reaction(s): Other (See Comments) Other Reaction: contrast-itching and flushing  . Methylprednisolone Hives  . Other Itching    Other reaction(s): Other (See Comments) Uncoded Allergy. Allergen: iv contrast dye, Other Reaction: flushing Arthritis medications Other reaction(s): Other (See Comments) Uncoded Allergy. Allergen: iv contrast dye, Other Reaction: flushing Arthritis medications  . Pollen Extract Hives  . Trimethadione     Other reaction(s): Other (See  Comments) Other Reaction: Other reaction  . Trazodone And Nefazodone Hives and Rash    Medications: I have reviewed the patient's current medications.  Results for orders placed or performed during the hospital encounter of 02/21/2015 (from the past 48 hour(s))  CBC     Status: Abnormal   Collection Time: 03/12/2015 12:33 AM  Result Value Ref Range   WBC 7.4 3.6 - 11.0 K/uL   RBC 3.45 (L) 3.80 - 5.20 MIL/uL   Hemoglobin 11.7 (L) 12.0 - 16.0 g/dL   HCT 36.7 35.0 - 47.0 %   MCV 106.4 (H) 80.0 - 100.0 fL   MCH 34.0 26.0 - 34.0 pg   MCHC 31.9 (L) 32.0 - 36.0 g/dL   RDW 20.8 (H) 11.5 - 14.5 %   Platelets 134 (L) 150 - 440 K/uL  Comprehensive metabolic panel     Status: Abnormal   Collection Time: 03/15/2015 12:33 AM  Result Value Ref Range   Sodium 140 135 - 145 mmol/L   Potassium 4.6 3.5 - 5.1 mmol/L   Chloride 95 (L) 101 - 111 mmol/L   CO2 29 22 - 32 mmol/L   Glucose, Bld 129 (H) 65 - 99 mg/dL   BUN 57 (H) 6 - 20 mg/dL   Creatinine, Ser 5.56 (H) 0.44 - 1.00 mg/dL   Calcium 9.6 8.9 - 10.3 mg/dL   Total Protein 8.5 (H) 6.5 - 8.1 g/dL   Albumin 4.2 3.5 - 5.0 g/dL   AST 29 15 - 41 U/L   ALT 14 14 - 54 U/L   Alkaline Phosphatase 296 (H) 38 - 126 U/L   Total Bilirubin 1.0 0.3 - 1.2 mg/dL   GFR calc non Af Amer 8 (L) >60 mL/min   GFR calc Af Amer 9 (L) >60 mL/min    Comment: (NOTE) The eGFR has been calculated using the CKD EPI equation. This calculation has not been validated in all clinical situations. eGFR's persistently <60 mL/min signify possible Chronic Kidney Disease.    Anion gap 16 (H) 5 - 15  Type and screen     Status: None (Preliminary result)   Collection Time: 02/28/2015 12:33 AM  Result Value Ref Range   ABO/RH(D) A POS    Antibody Screen POS    Sample Expiration     Antibody Identification ANTI K    Unit Number [id number]    Blood Component Type RED CELLS,LR    Unit division 00    Status of Unit ALLOCATED    Transfusion Status OK TO TRANSFUSE     Crossmatch Result COMPATIBLE    Unit Number [id number]    Blood Component Type RED CELLS,LR    Unit division 00    Status of Unit ALLOCATED    Transfusion Status OK TO TRANSFUSE    Crossmatch Result COMPATIBLE

## 2015-03-17 NOTE — Progress Notes (Signed)
I called [facility] and spoke with Ronna Polio regarding patient's death. She is not a candidate for donation.

## 2015-03-17 NOTE — Discharge Summary (Signed)
  [place] [facility]    Death Note  Death Note please see Last Note for all details.   In breif [name] was a 59 year old woman past medical history of end-stage renal disease on hemodialysis, sick sinus syndrome with pacemaker in place, obesity, sleep apnea, mental retardation, seizure disorder, congestive heart failure, peripheral vascular disease admitted from nursing home on 8/4 with sepsis due to diarrhea. In the morning and 8/5 she had CODE BLUE cardiac arrest and underwent resuscitation for 7 minutes, and then again for 15 minutes. On the first resuscitation attempt she was intubated and placed on mechanical ventilation. After the second event she continued to be hypotensive despite multiple pressor agents. She did not regain any signs of neurologic function, was not on any sedating medications despite mechanical ventilation. Palliative care team met with the family and she was made a DO NOT RESUSCITATE, but the decision was made not to with Care at that time. Her condition did not improve overnight and this morning at 9:58 AM her blood pressure dropped again and she became asystolic.       [name] [id number] is a 59 y.o. female, Outpatient Primary MD for the patient is Alvester Morin, MD  Pronounced dead by Dr. Rogue Jury on   03/16/2015   @                 2022-09-19: 65 AM  Cause of death sepsis, metabolic acidosis due to sepsis, end-stage renal disease   Myrtis Ser M.D on Mar 16, 2015 at 1:38 PM  [place] [facility] [place] [phone]  Total clinical and documentation time for today Under 30 minutes   Last Note

## 2015-03-17 NOTE — Progress Notes (Signed)
Return call from [facility], patient is not candidate for organ procurement, but call back if cardiac death for possible tissue.

## 2015-03-17 DEATH — deceased

## 2015-03-27 IMAGING — XA IR VASCULAR PROCEDURE
11 series · 15 of 15 positions shown · IV contrast (IODINE)
Comparison: none

[Series 1: care upper arm · 2 of 2 slices shown (1 of 9)]
[im 1/2]
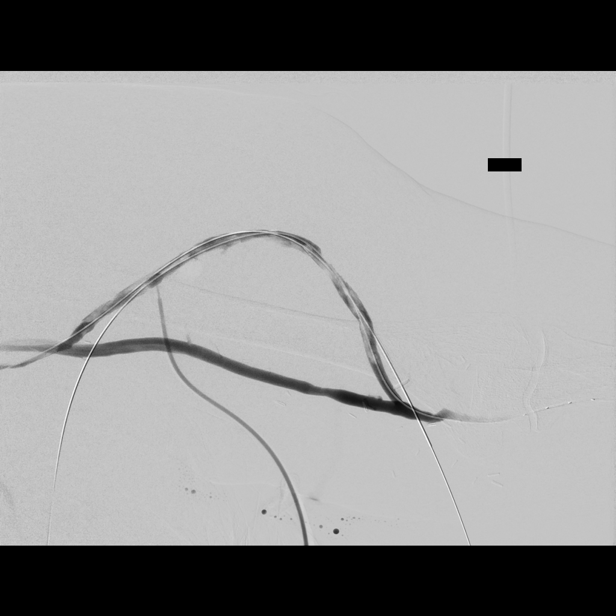
[im 2/2]
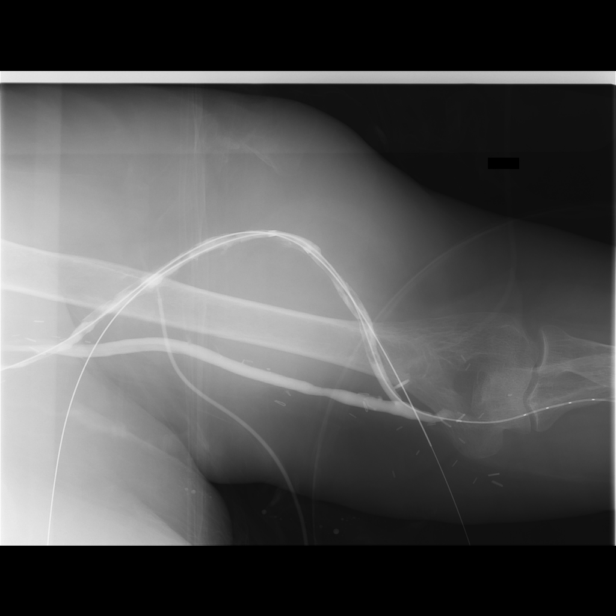

[Series 2: fl - angio · 1 of 1 slices shown (1 of 2)]
[im 1/1]
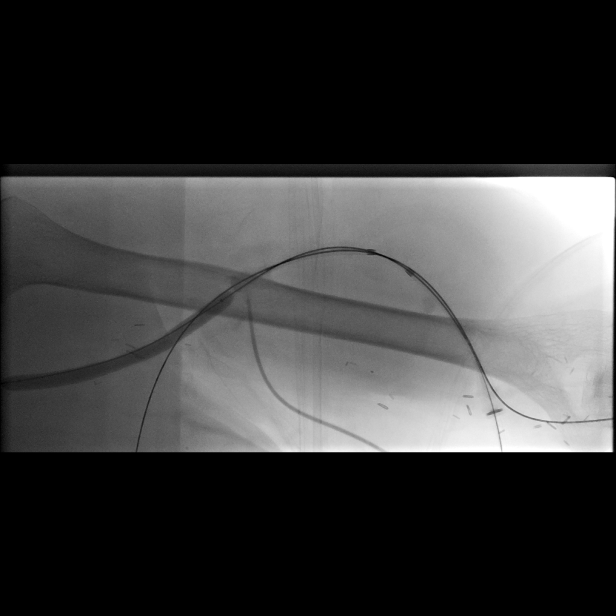

[Series 3: care upper arm · 1 of 1 slices shown (2 of 9)]
[im 1/1]
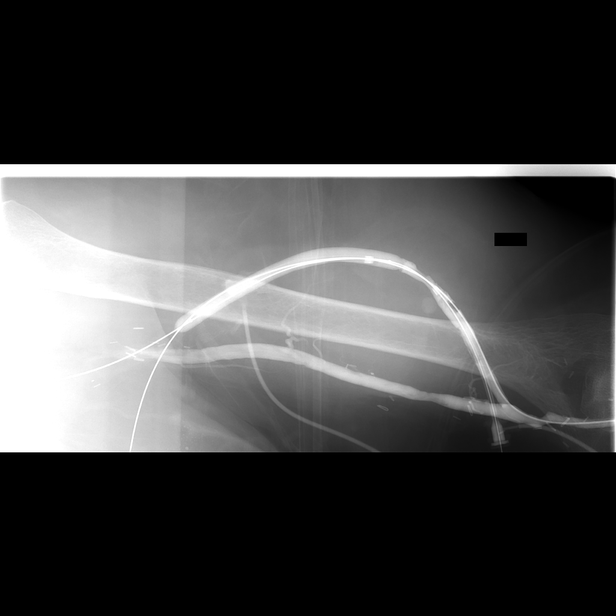

[Series 4: care upper arm · 2 of 2 slices shown (3 of 9)]
[im 1/2]
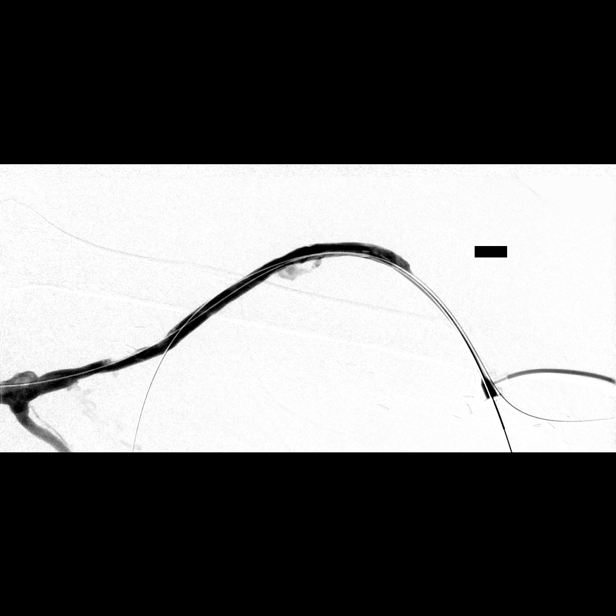
[im 2/2]
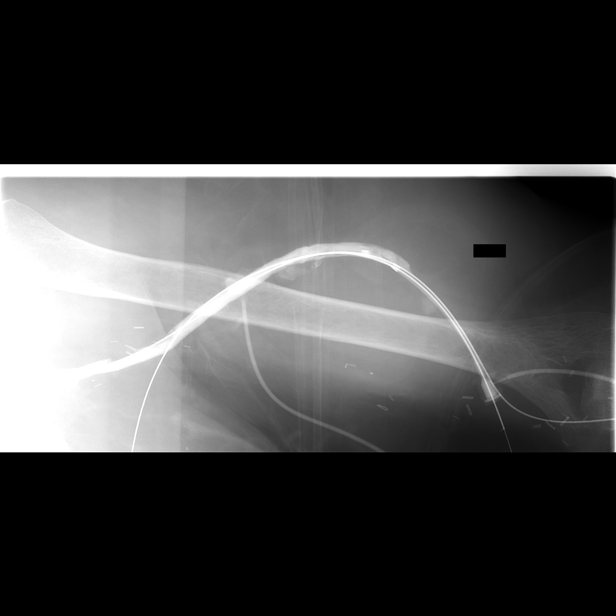

[Series 5: fl - angio · 1 of 1 slices shown (2 of 2)]
[im 1/1]
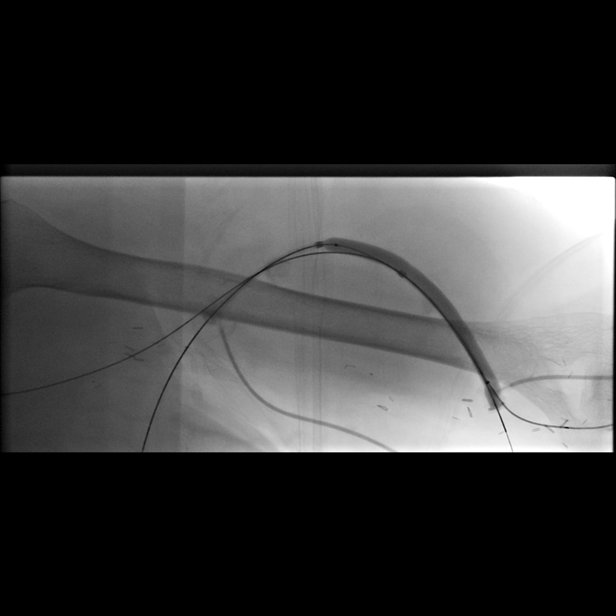

[Series 6: care upper arm · 2 of 2 slices shown (4 of 9)]
[im 1/2]
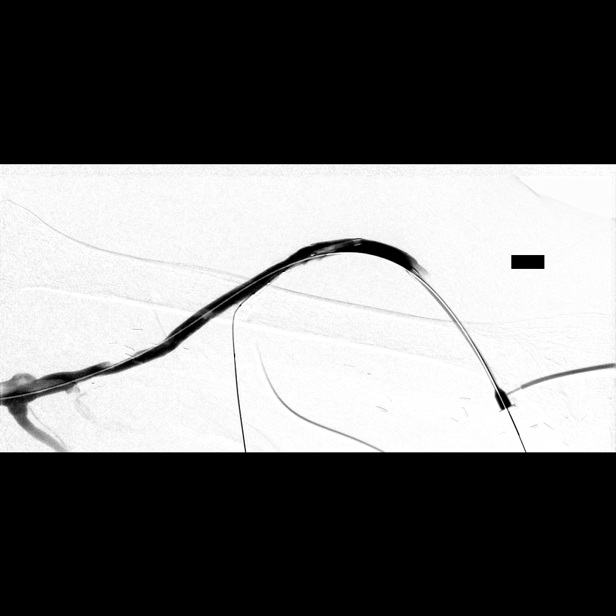
[im 2/2]
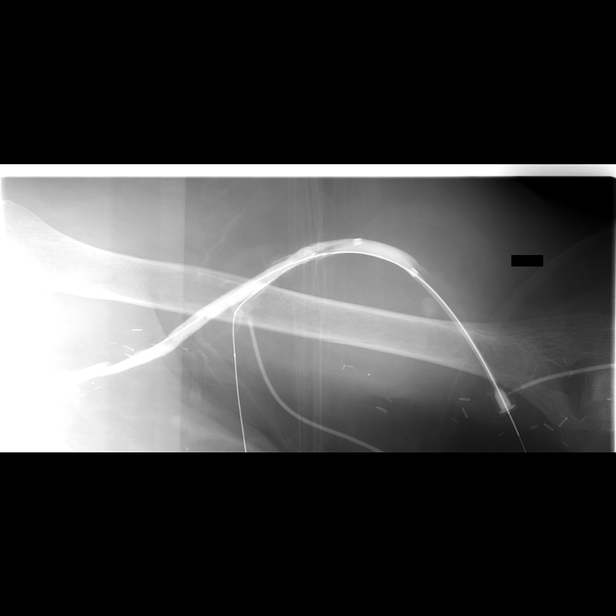

[Series 8: care upper arm · 1 of 1 slices shown (5 of 9)]
[im 1/1]
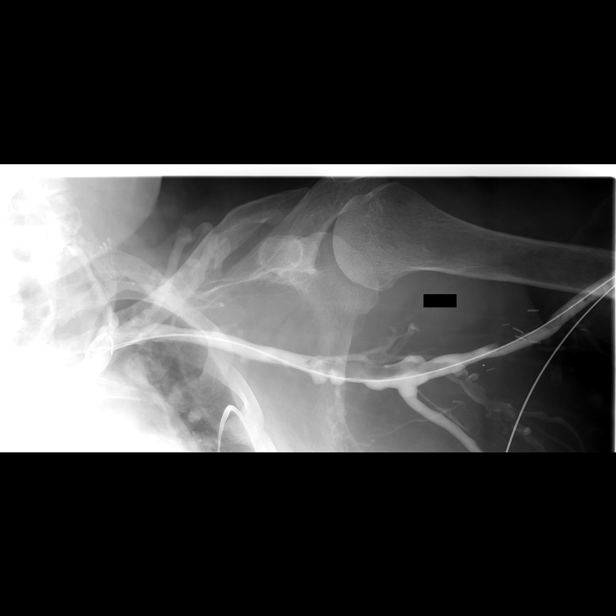

[Series 10: care upper arm · 2 of 2 slices shown (6 of 9)]
[im 1/2]
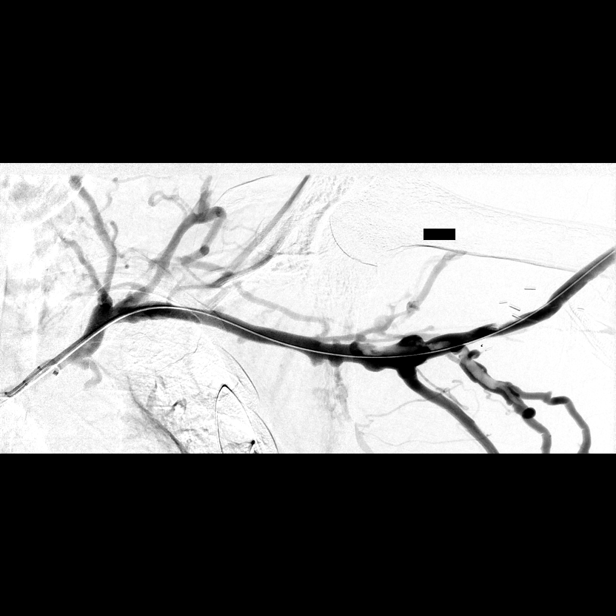
[im 2/2]
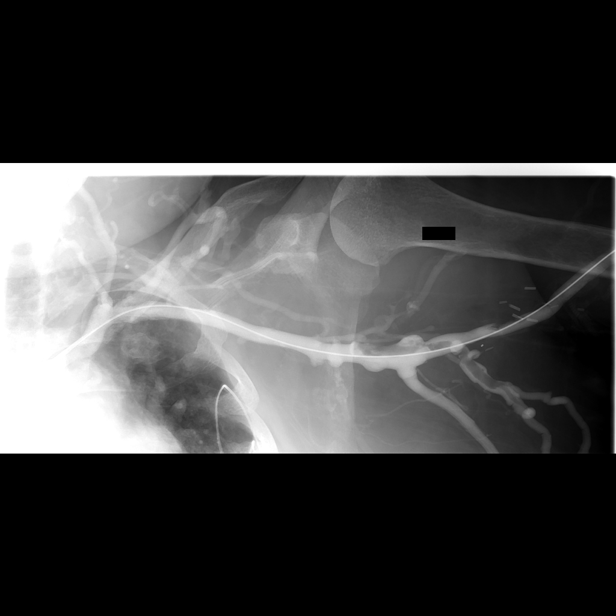

[Series 11: care upper arm · 1 of 1 slices shown (7 of 9)]
[im 1/1]
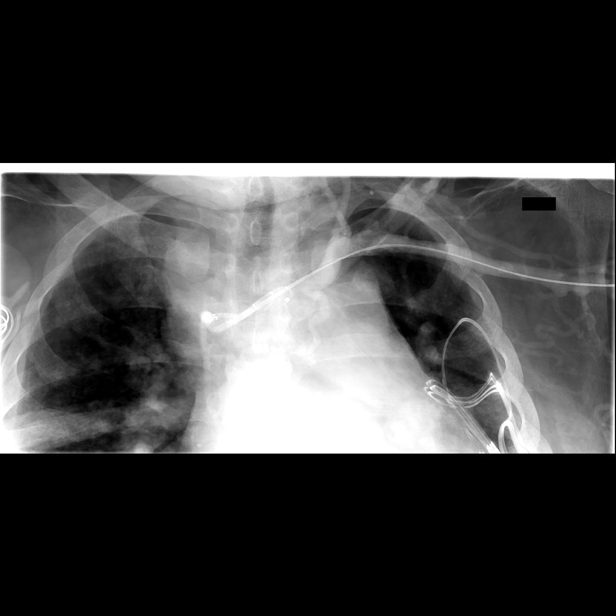

[Series 12: care upper arm · 1 of 1 slices shown (8 of 9)]
[im 1/1]
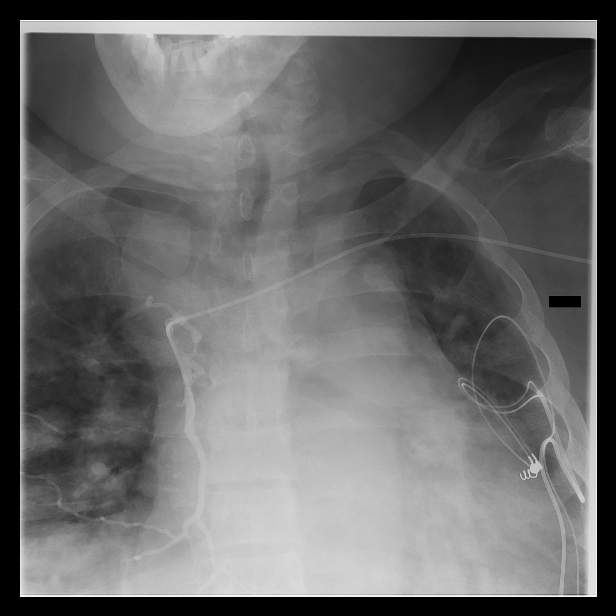

[Series 13: care upper arm · 1 of 1 slices shown (9 of 9)]
[im 1/1]
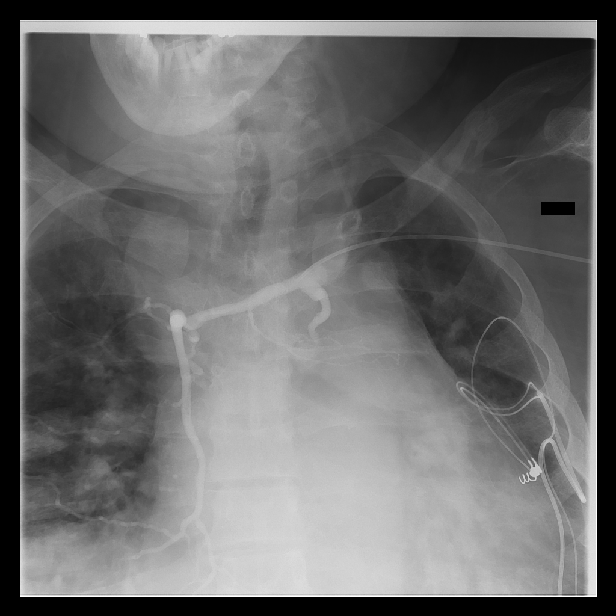

[15 of 15 positions shown; findings below may reference images not displayed]

IMAGES IMPORTED FROM THE [employer] WORKFLOW SYSTEM
NO DICTATION FOR STUDY

## 2015-04-14 IMAGING — XA IR VASCULAR PROCEDURE
12 of 14 series · 15 of 23 positions shown · IV contrast (IODINE)
Comparison: none

[Series 2: care upper arm · 1 of 2 slices shown (1 of 10)]
[im 1/2]
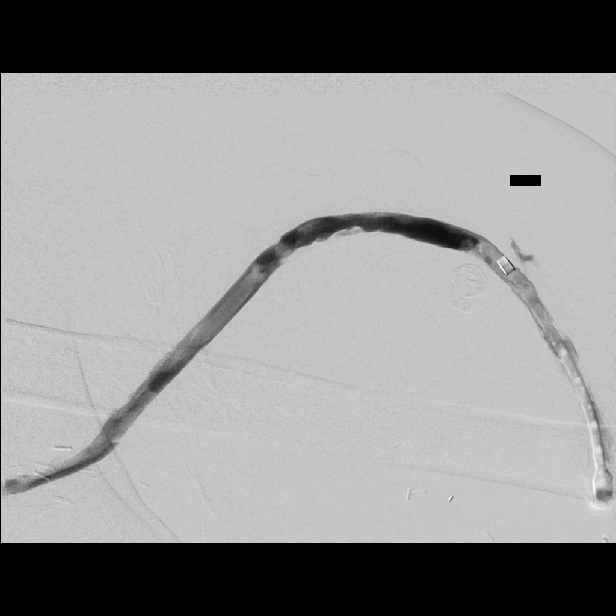

[Series 3: care upper arm · 2 of 2 slices shown (2 of 10)]
[im 1/2]
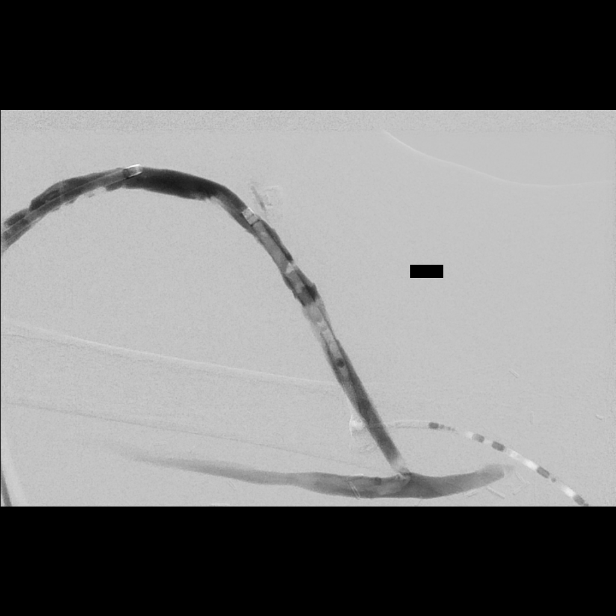
[im 2/2]
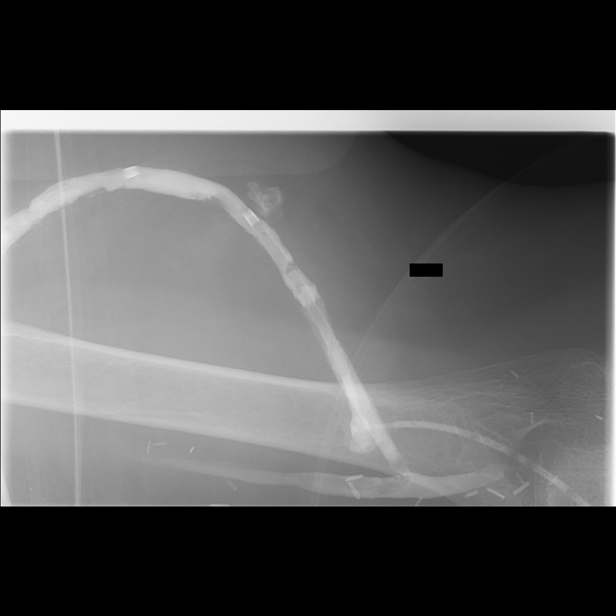

[Series 5: care upper arm · 2 of 2 slices shown (3 of 10)]
[im 1/2]
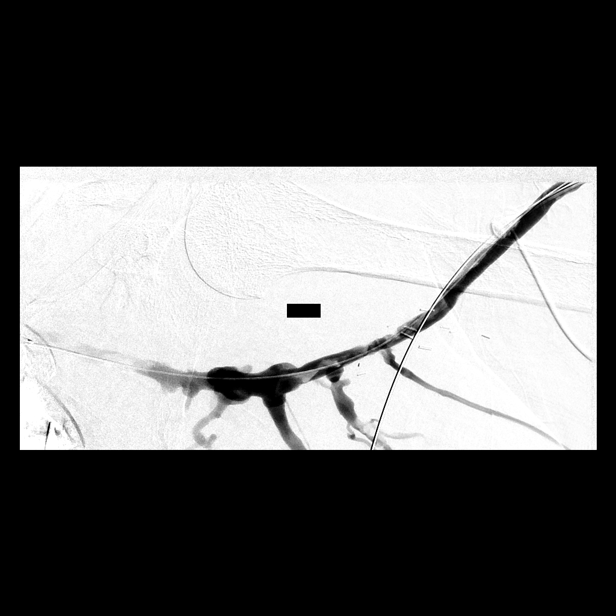
[im 2/2]
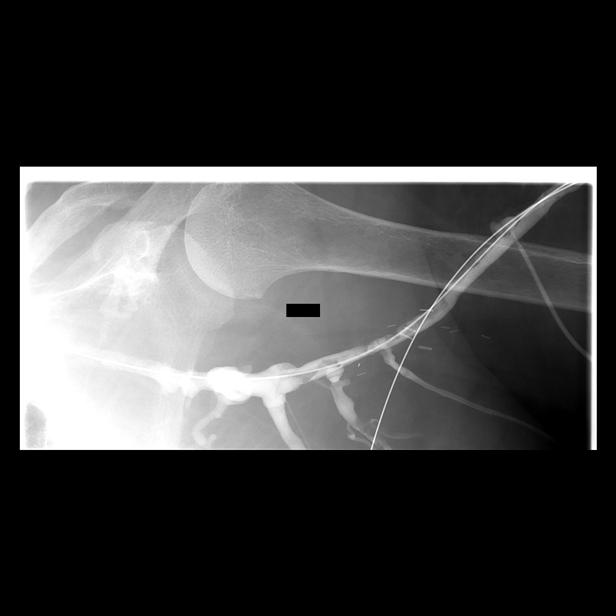

[Series 6: care upper arm · 1 of 2 slices shown (4 of 10)]
[im 2/2]
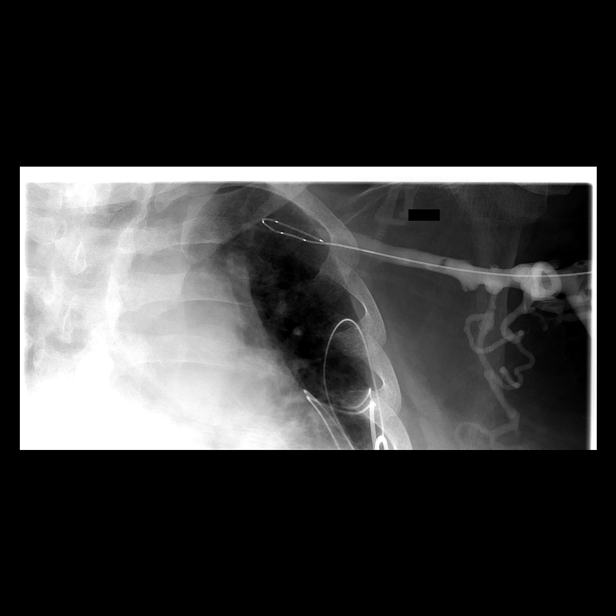

[Series 7: care upper arm · 1 of 1 slices shown (5 of 10)]
[im 1/1]
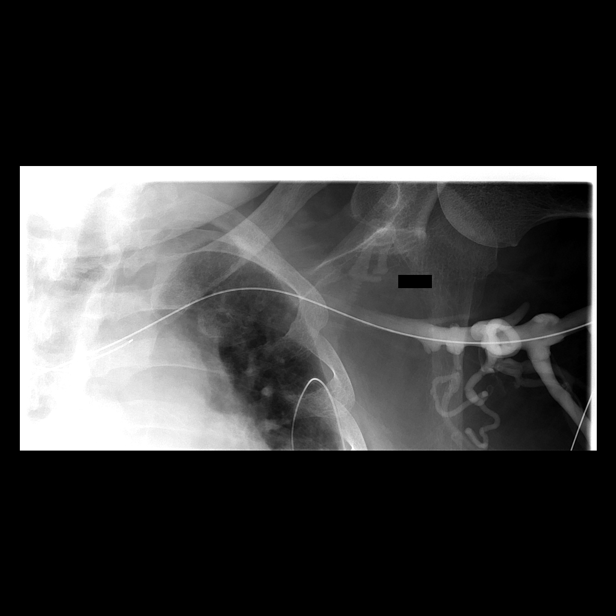

[Series 8: care upper arm · 1 of 2 slices shown (6 of 10)]
[im 2/2]
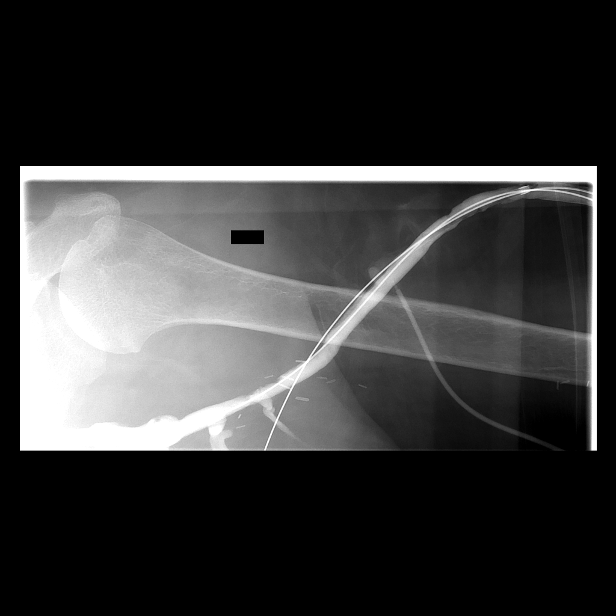

[Series 10: fl - angio · 1 of 1 slices shown (1 of 2)]
[im 1/1]
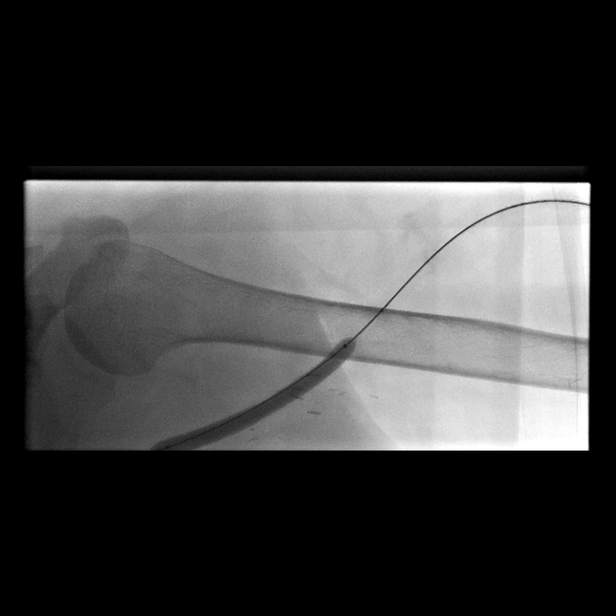

[Series 11: care upper arm · 1 of 2 slices shown (7 of 10)]
[im 1/2]
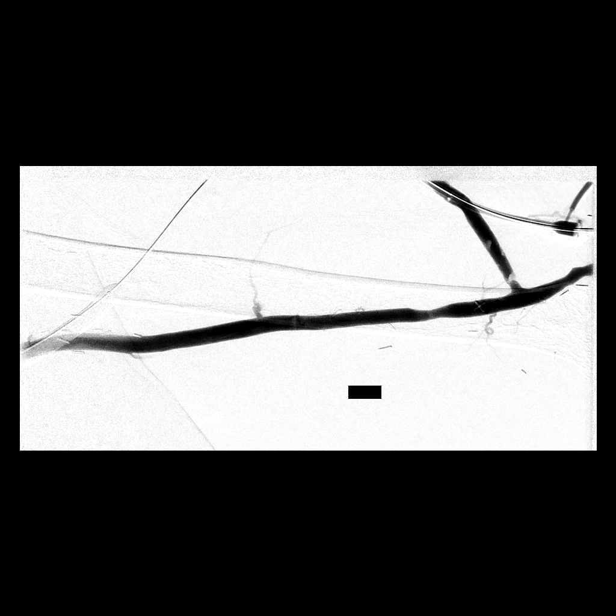

[Series 12: care upper arm · 2 of 2 slices shown (8 of 10)]
[im 1/2]
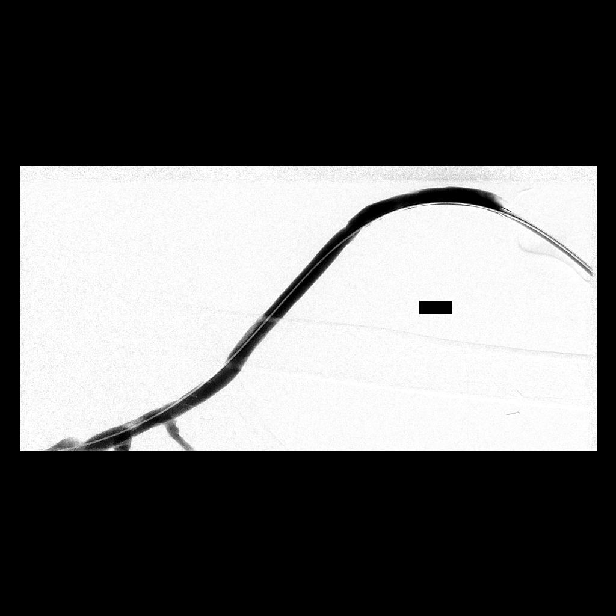
[im 2/2]
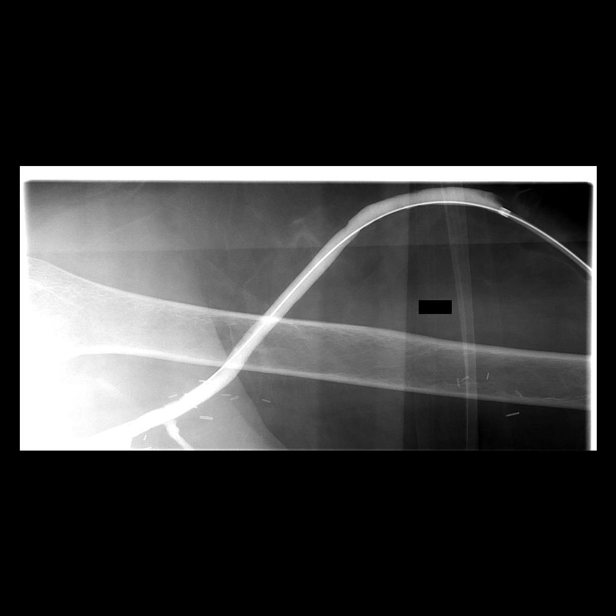

[Series 13: care upper arm · 1 of 2 slices shown (9 of 10)]
[im 2/2]
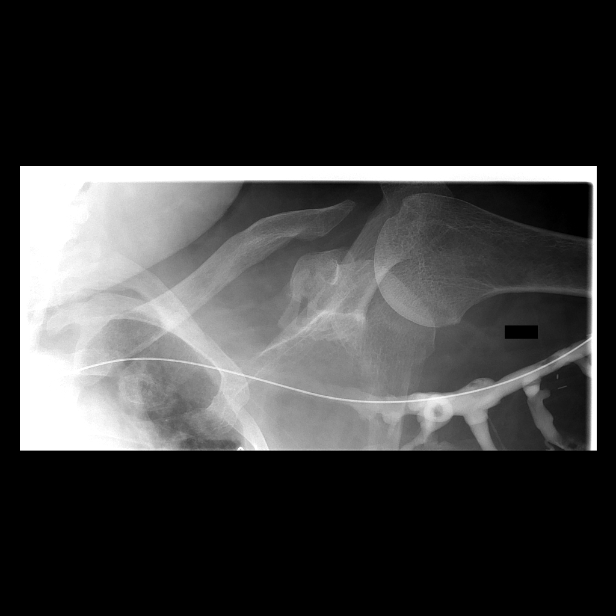

[Series 14: fl - angio · 1 of 1 slices shown (2 of 2)]
[im 1/1]
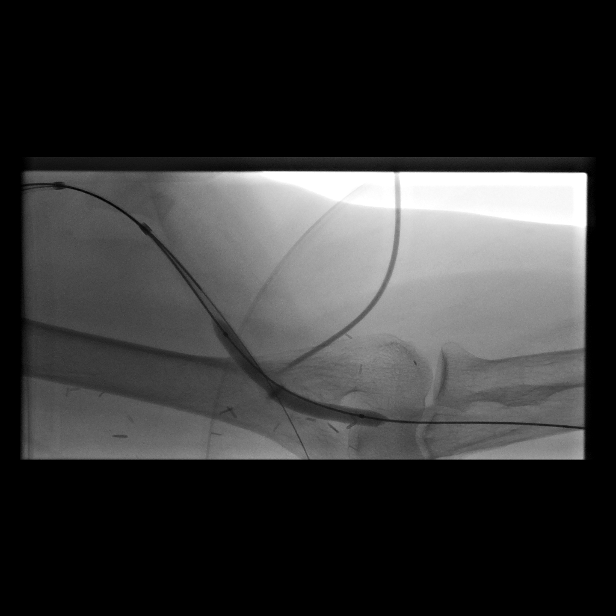

[Series 15: care upper arm · 1 of 2 slices shown (10 of 10)]
[im 2/2]
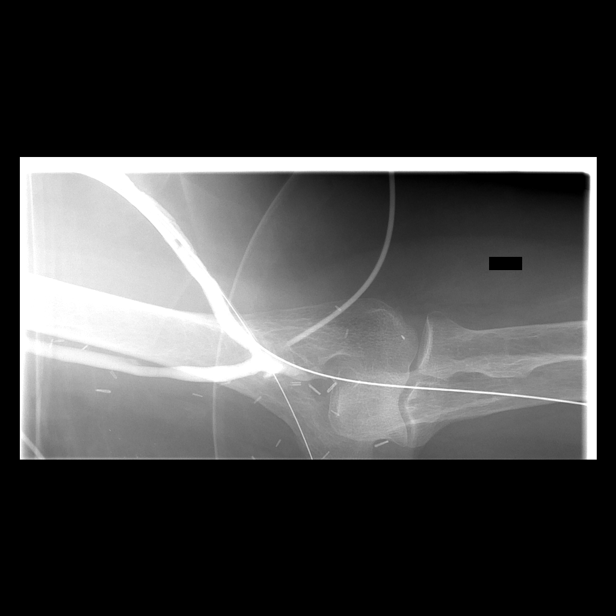

[15 of 23 positions shown; findings below may reference images not displayed]

IMAGES IMPORTED FROM THE SYNGO WORKFLOW SYSTEM
NO DICTATION FOR STUDY

## 2015-04-22 IMAGING — CT CT HEAD WITHOUT CONTRAST
1 series · 16 of 30 positions shown, 20 images · non-contrast
Comparison: none

REASON FOR EXAM: fall, head injury
COMMENTS:

PROCEDURE:     CT  - CT HEAD WITHOUT CONTRAST  - April 21, 2013  [DATE]
RESULT:     Comparison:  None
TECHNIQUE: Multiple axial images from the foramen magnum to the vertex were
obtained without IV contrast.

[Series 2: soft tissue · axial · 0.44mm/px · z∈[-258,-122]mm · 16 of 31 slices shown, 20 images]
[im 2/31  brain]
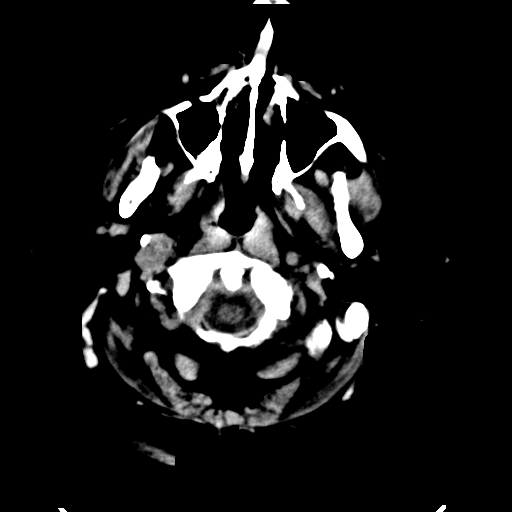
[im 2/31  bone]
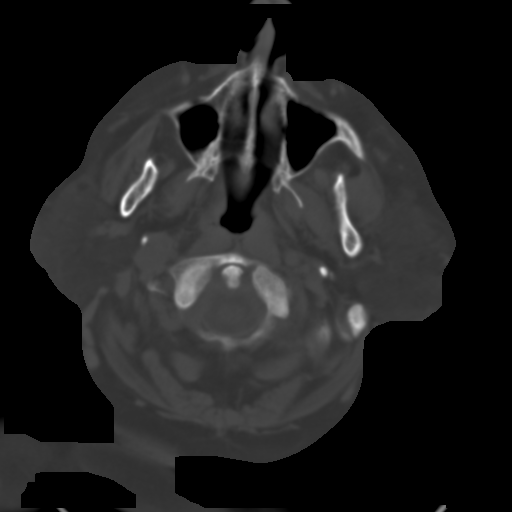
[im 4/31  brain]
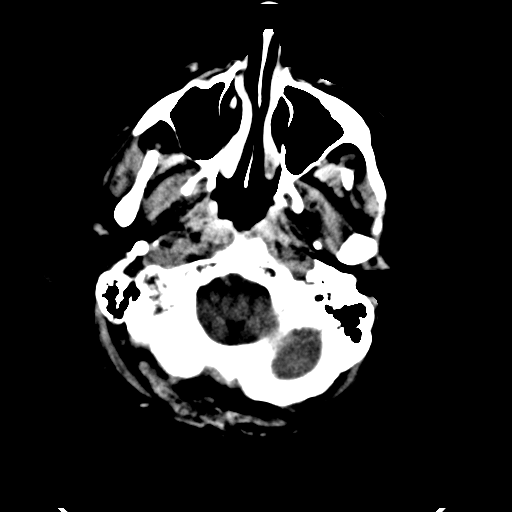
[im 6/31  brain]
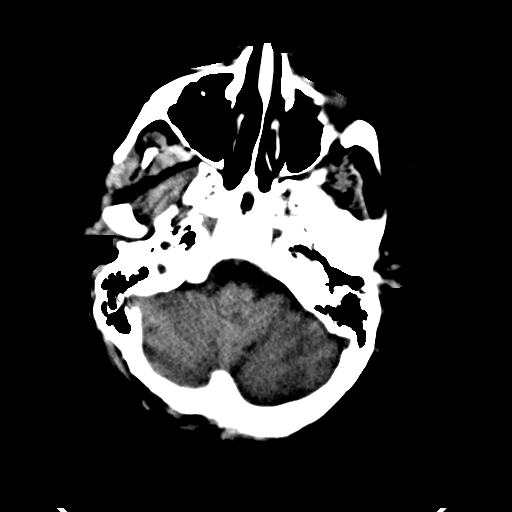
[im 8/31  brain]
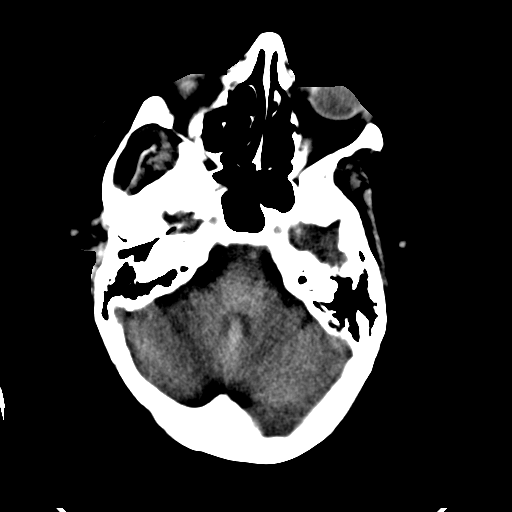
[im 9/31  brain]
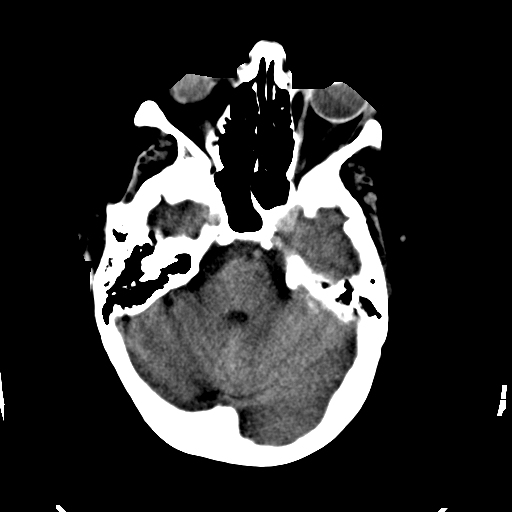
[im 9/31  bone]
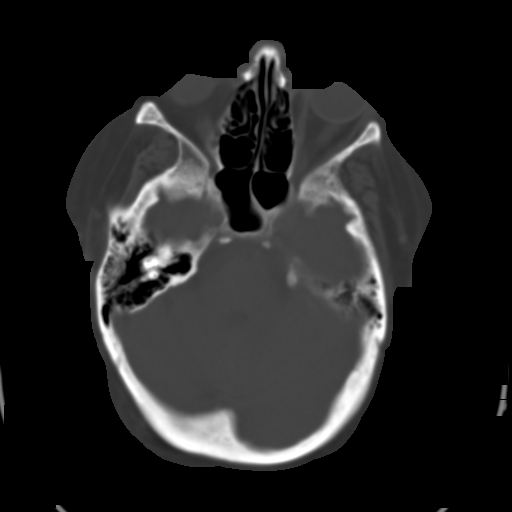
[im 11/31  brain]
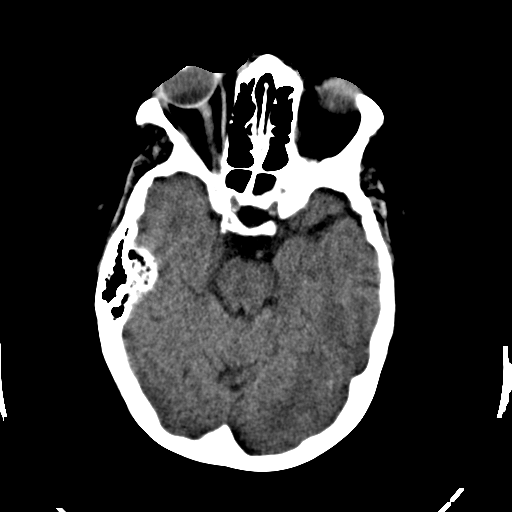
[im 13/31  brain]
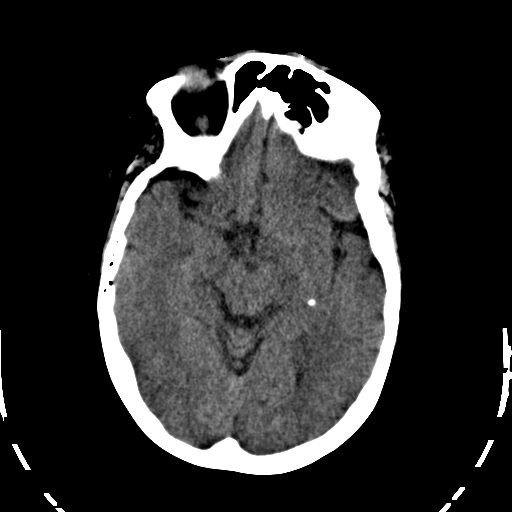
[im 15/31  brain]
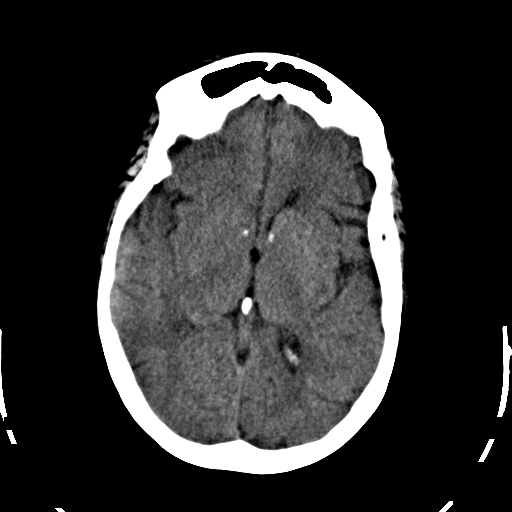
[im 16/31  brain]
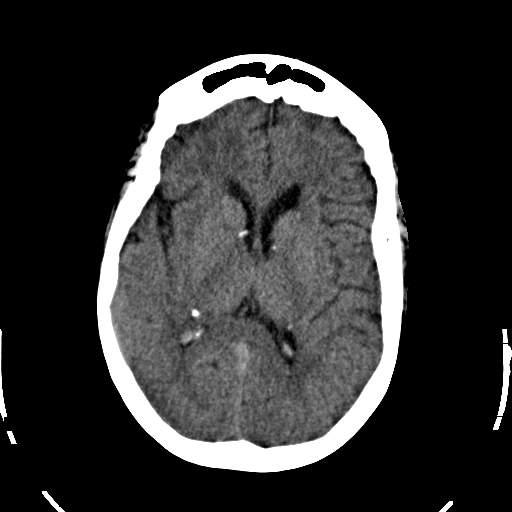
[im 16/31  bone]
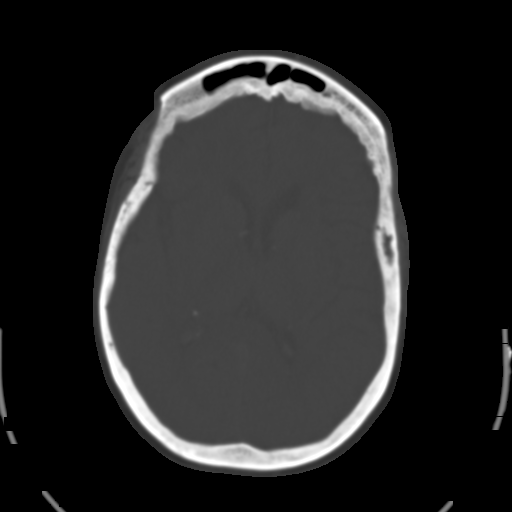
[im 18/31  brain]
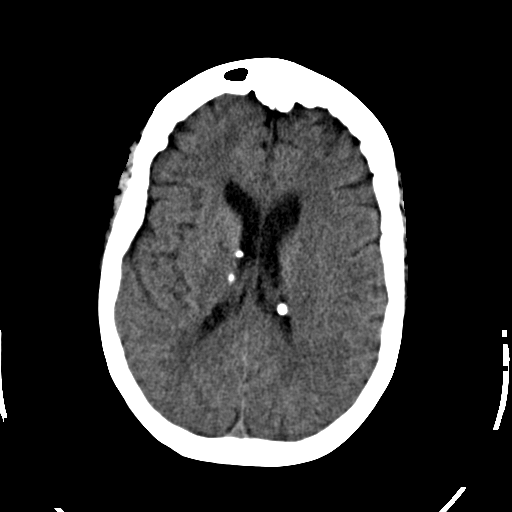
[im 20/31  brain]
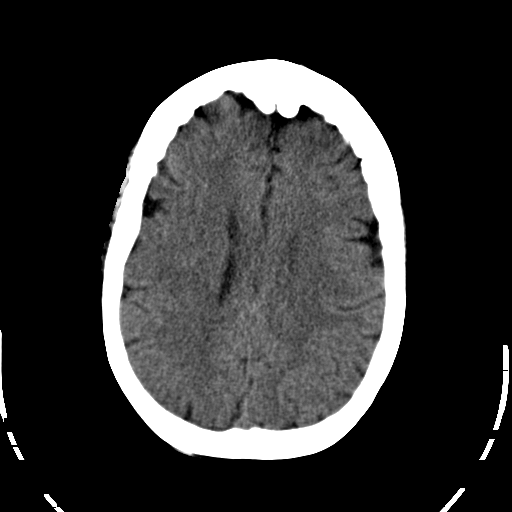
[im 22/31  brain]
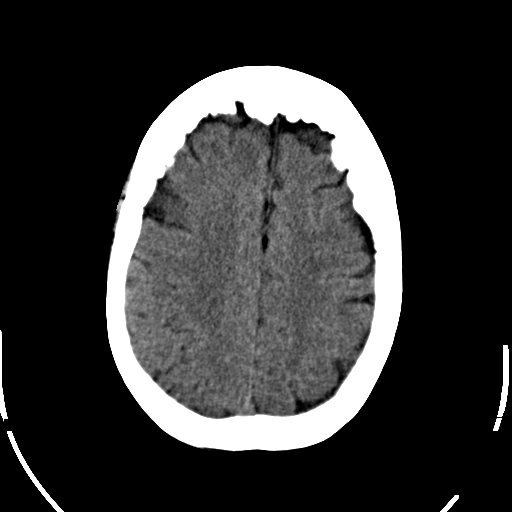
[im 23/31  brain]
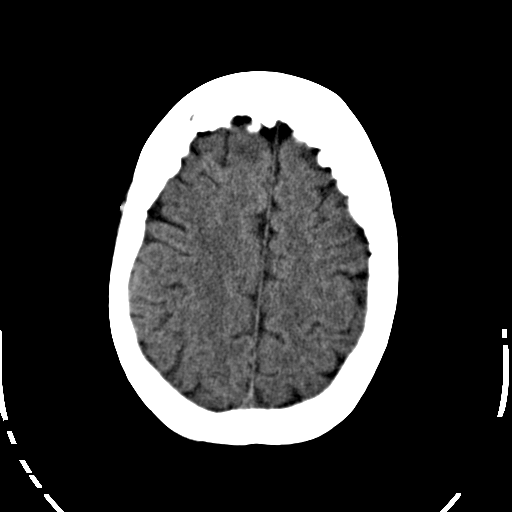
[im 23/31  bone]
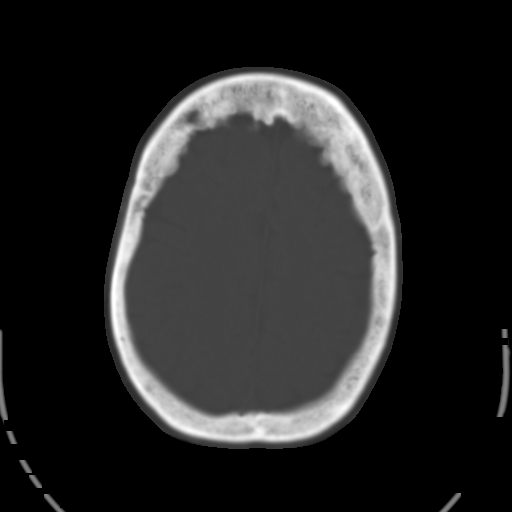
[im 25/31  brain]
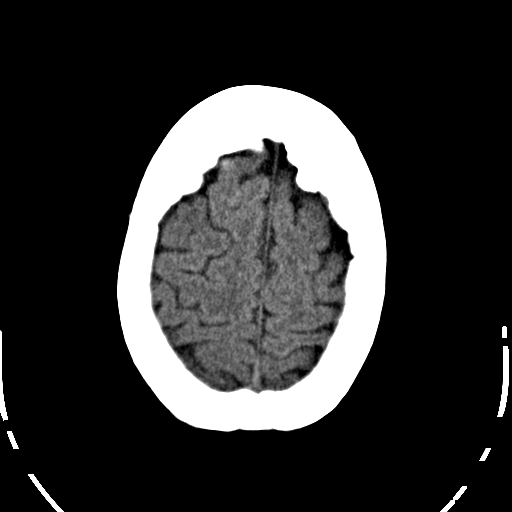
[im 27/31  brain]
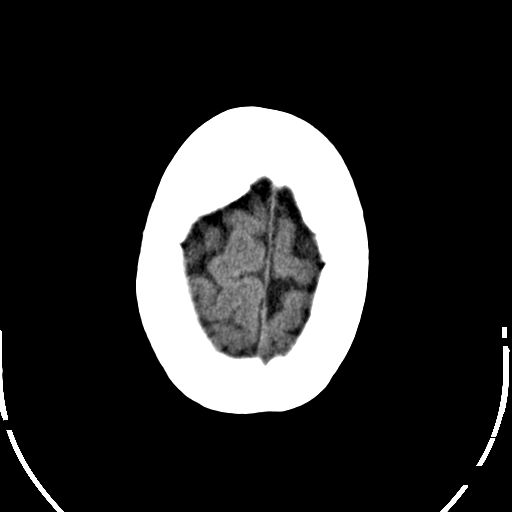
[im 29/31  brain]
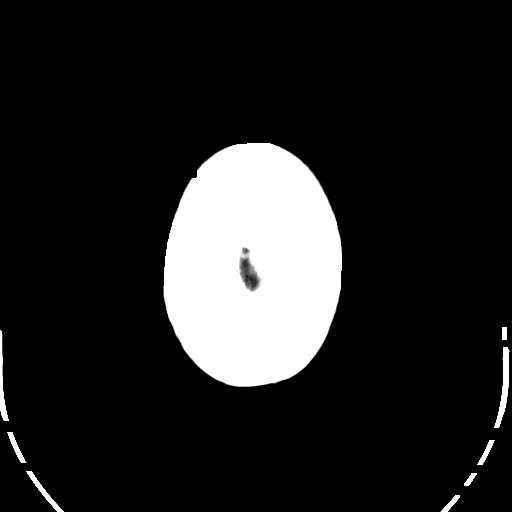

[16 of 30 positions shown; findings below may reference images not displayed]

FINDINGS: There is no evidence of mass effect, midline shift, or extra-axial fluid
collections.  There is no evidence of a space-occupying lesion or
intracranial hemorrhage. There is no evidence of a cortical-based area of
acute infarction.

The ventricles and sulci are appropriate for the patient's age. The basal
cisterns are patent.

Visualized portions of the orbits are unremarkable. The visualized portions
of the paranasal sinuses and mastoid air cells are unremarkable.

The osseous structures are unremarkable.
IMPRESSION: No acute intracranial process.

[REDACTED]

## 2015-05-17 NOTE — Progress Notes (Signed)
Entererd  into pt chart to collect QI data.

## 2015-05-27 IMAGING — XA IR VASCULAR PROCEDURE
2 series · 2 of 2 positions shown · non-contrast
Comparison: none

[Series 1: single · 1 of 1 slices shown (1 of 2)]
[im 1/1]
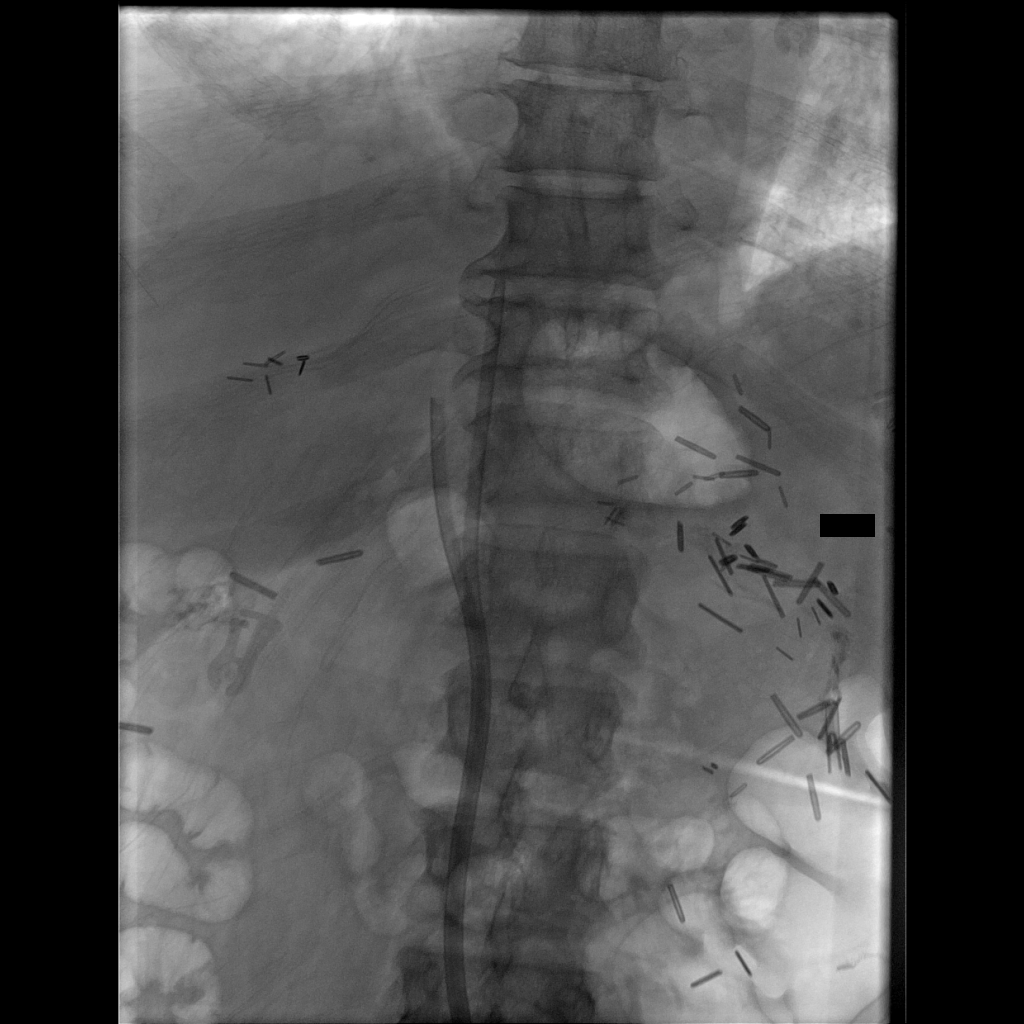

[Series 2: single · 1 of 1 slices shown (2 of 2)]
[im 1/1]
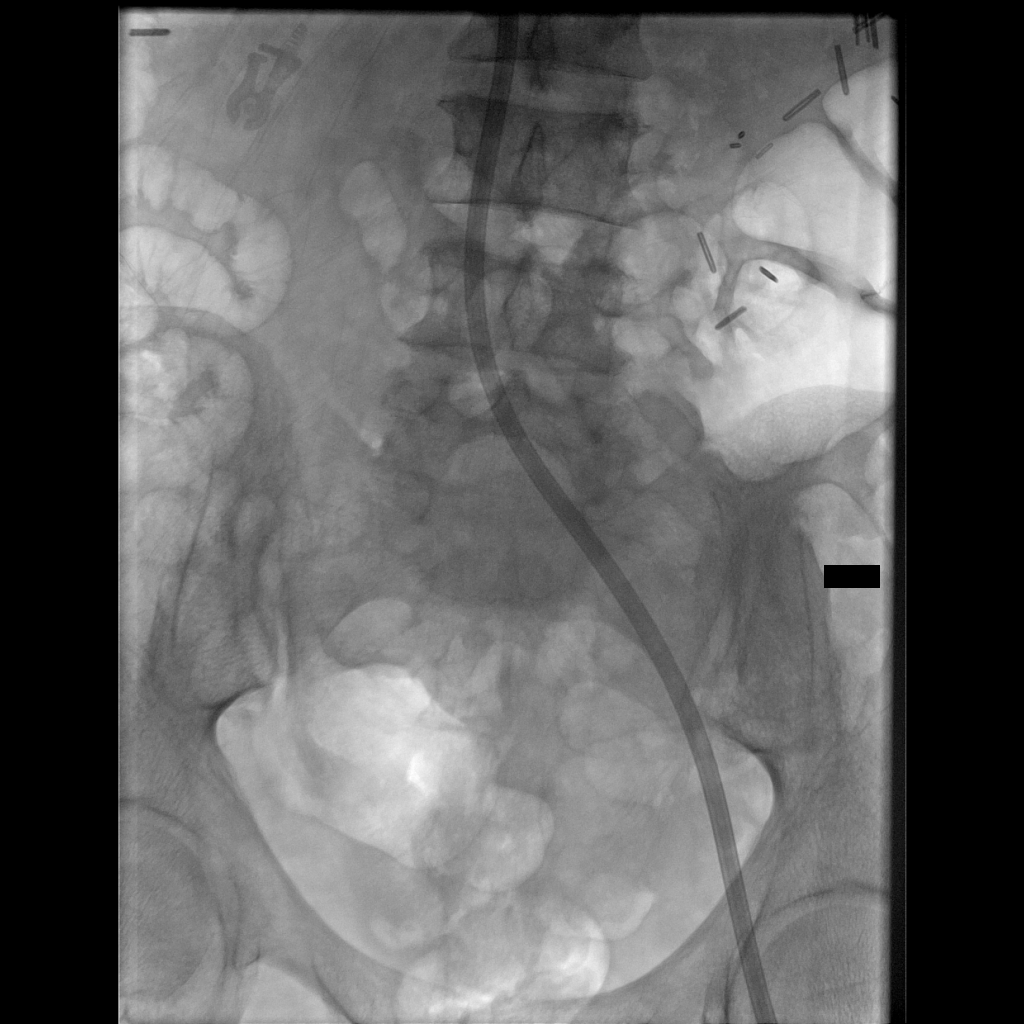

[2 of 2 positions shown; findings below may reference images not displayed]

IMAGES IMPORTED FROM THE [employer] WORKFLOW SYSTEM
NO DICTATION FOR STUDY

## 2015-06-25 IMAGING — XA IR VASCULAR PROCEDURE
4 series · 8 of 8 positions shown · IV contrast (IODINE)
Comparison: none

[Series 1: forearm · 2 of 2 slices shown (1 of 4)]
[im 1/2]
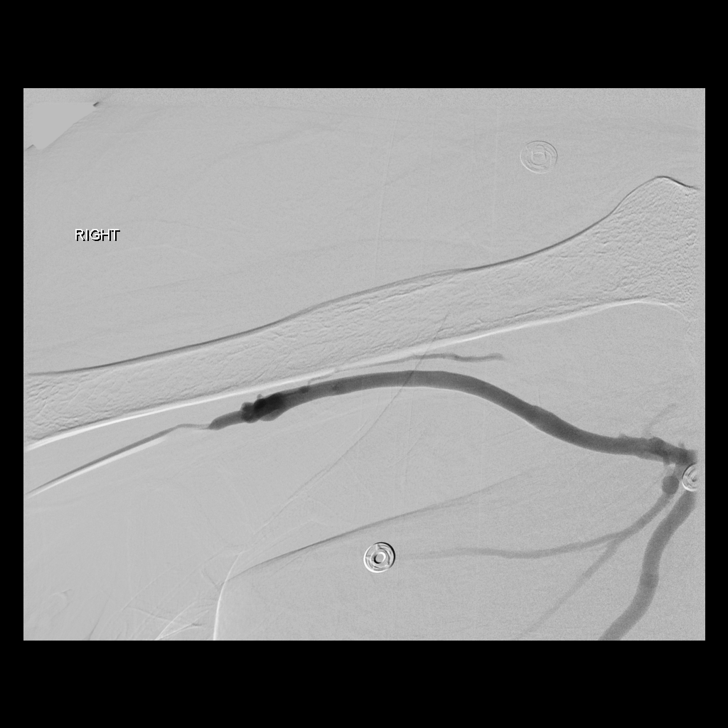
[im 2/2]
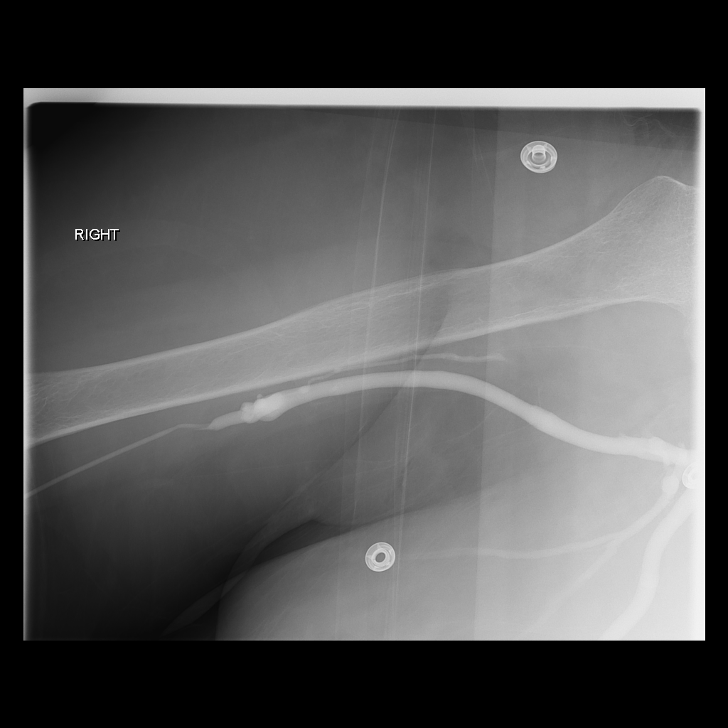

[Series 2: forearm · 2 of 2 slices shown (2 of 4)]
[im 1/2]
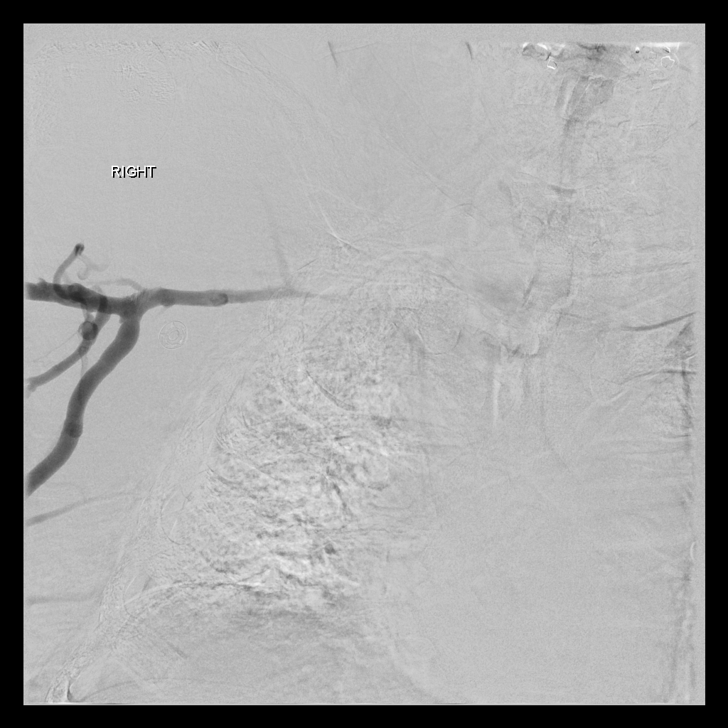
[im 2/2]
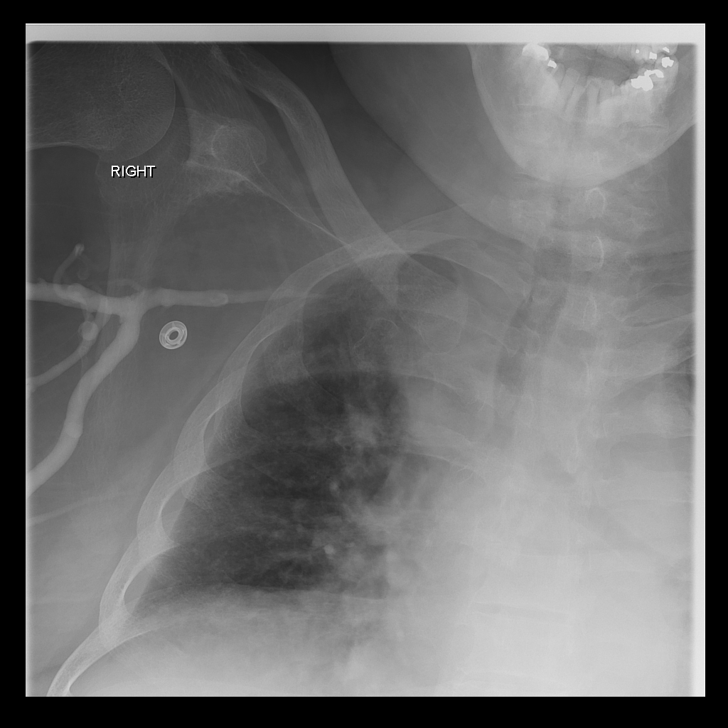

[Series 3: forearm · 2 of 2 slices shown (3 of 4)]
[im 1/2]
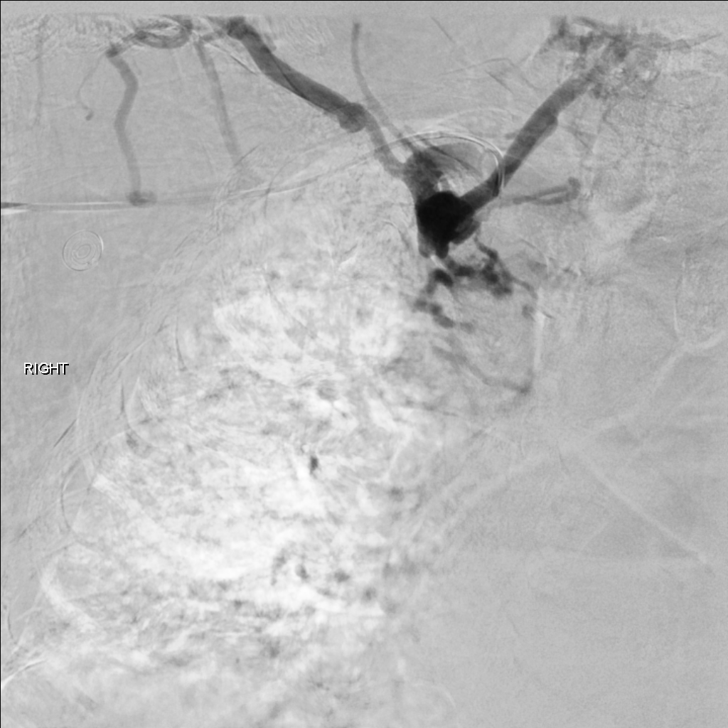
[im 2/2]
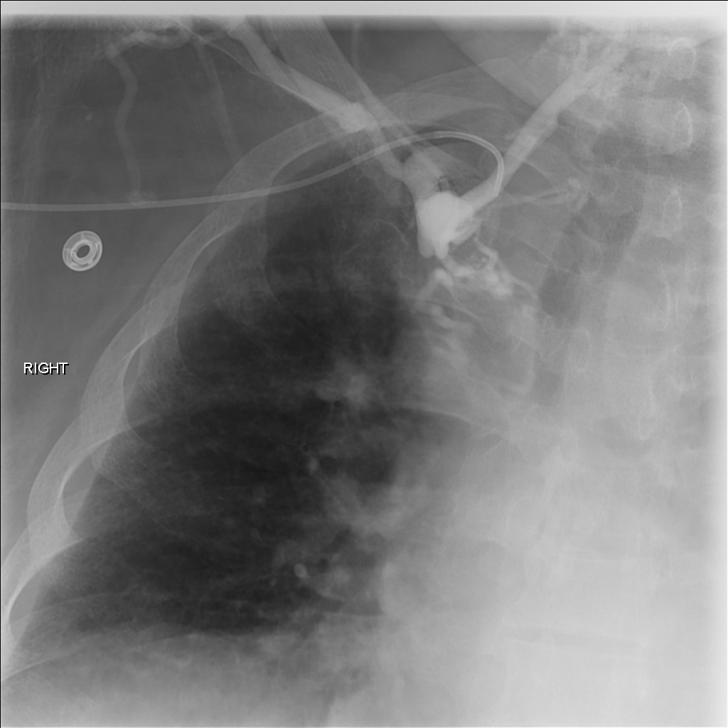

[Series 4: forearm · 2 of 2 slices shown (4 of 4)]
[im 1/2]
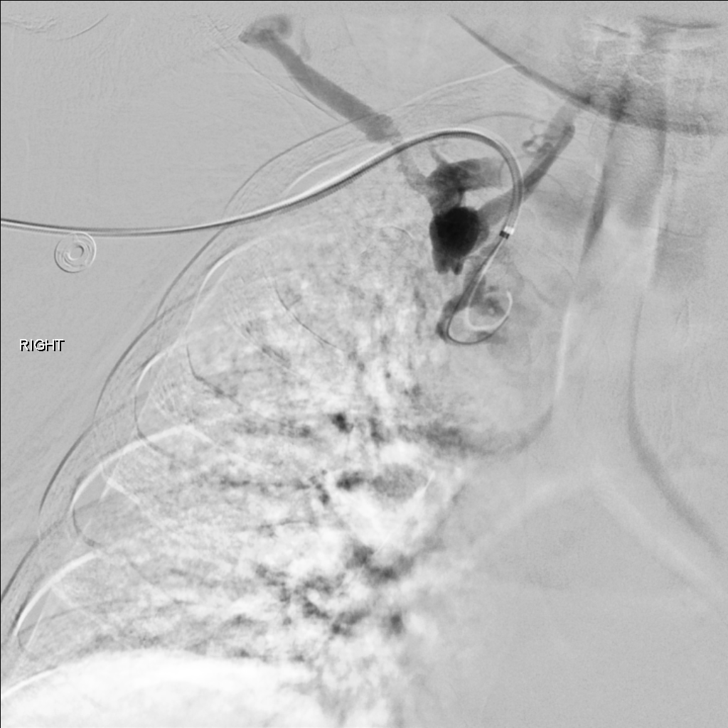
[im 2/2]
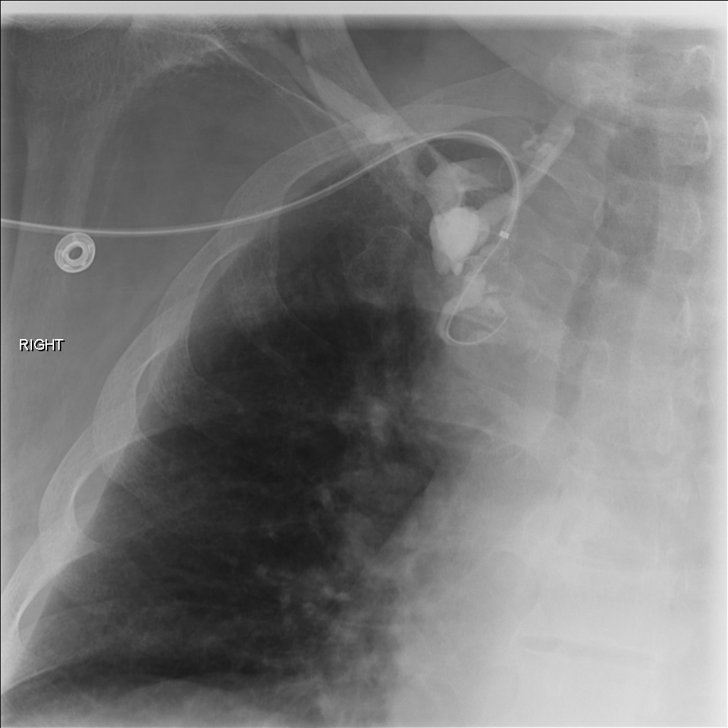

[8 of 8 positions shown; findings below may reference images not displayed]

IMAGES IMPORTED FROM THE [employer] WORKFLOW SYSTEM
NO DICTATION FOR STUDY

## 2015-07-07 IMAGING — CR DG CHEST 1V PORT
1 series · 2 of 2 positions shown · non-contrast
Comparison: 02/24/2013

CLINICAL DATA: Hypotensive

EXAM:
PORTABLE CHEST - 1 VIEW

[Series 1: ap · 0.17mm/px · 2 of 2 slices shown]
[im 1/2]
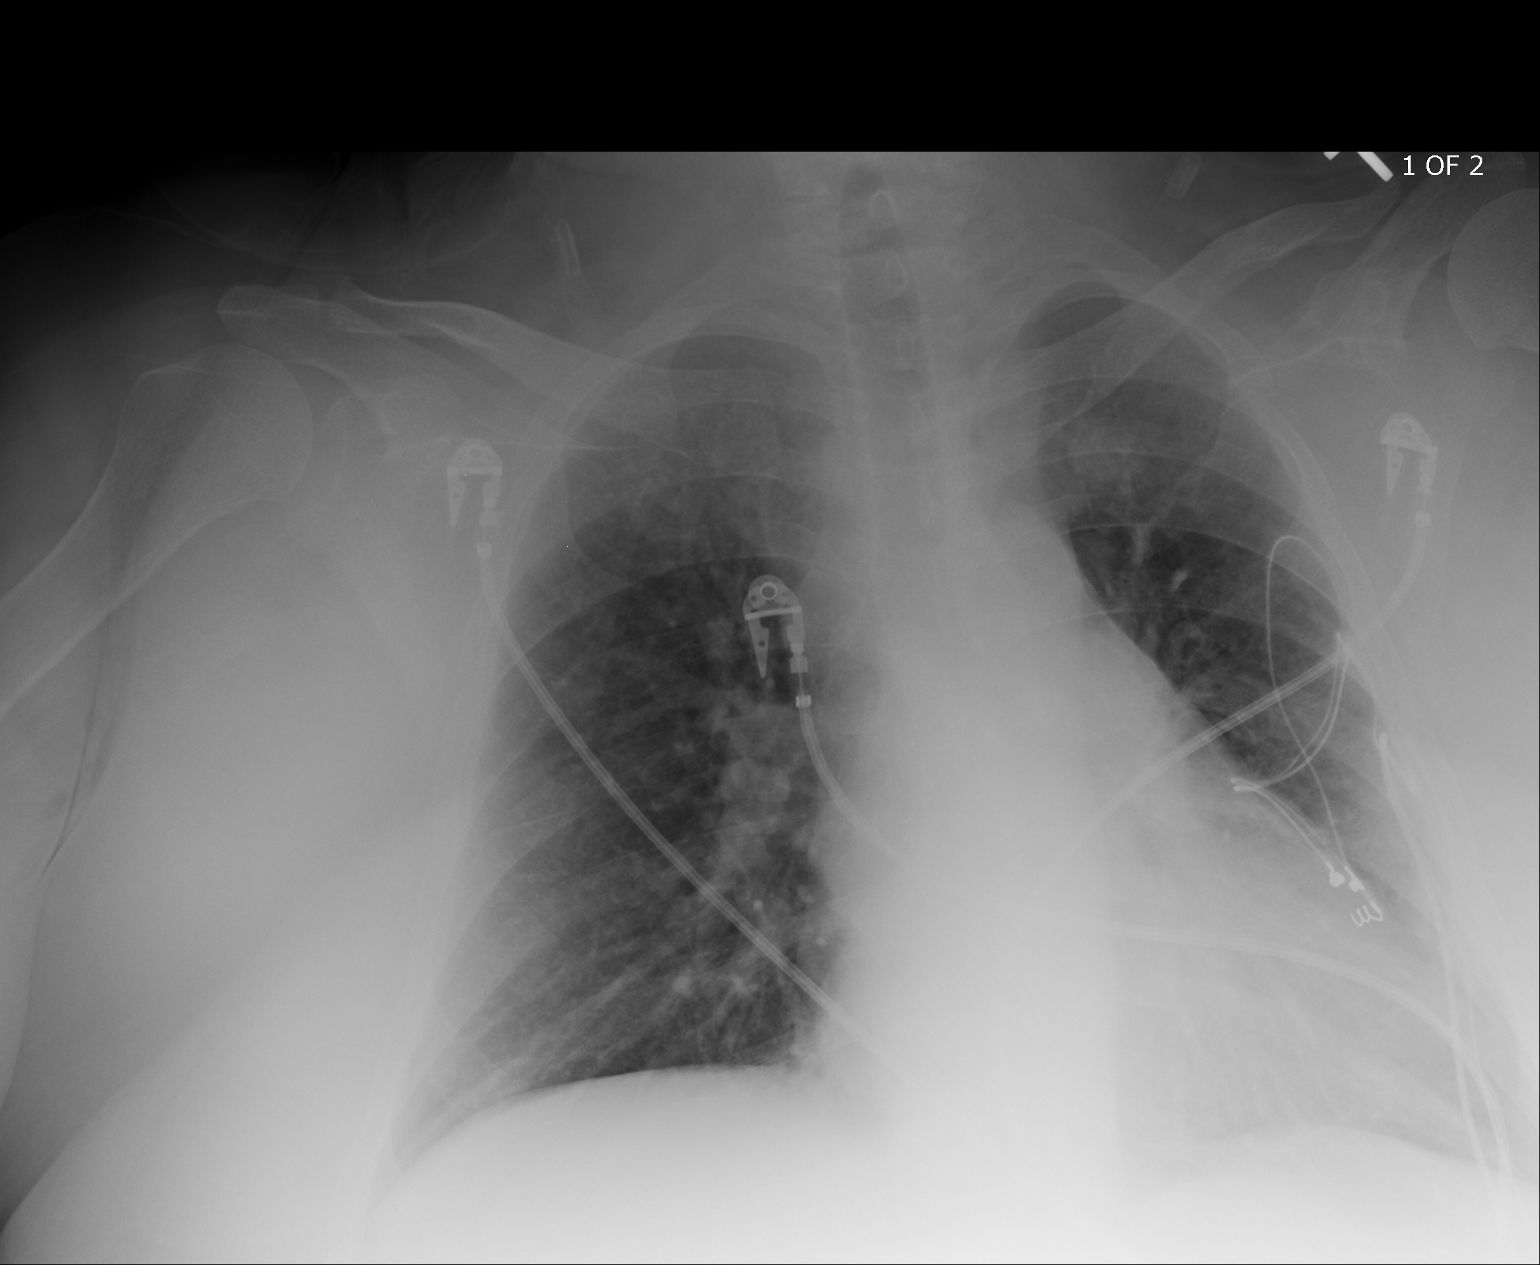
[im 2/2]
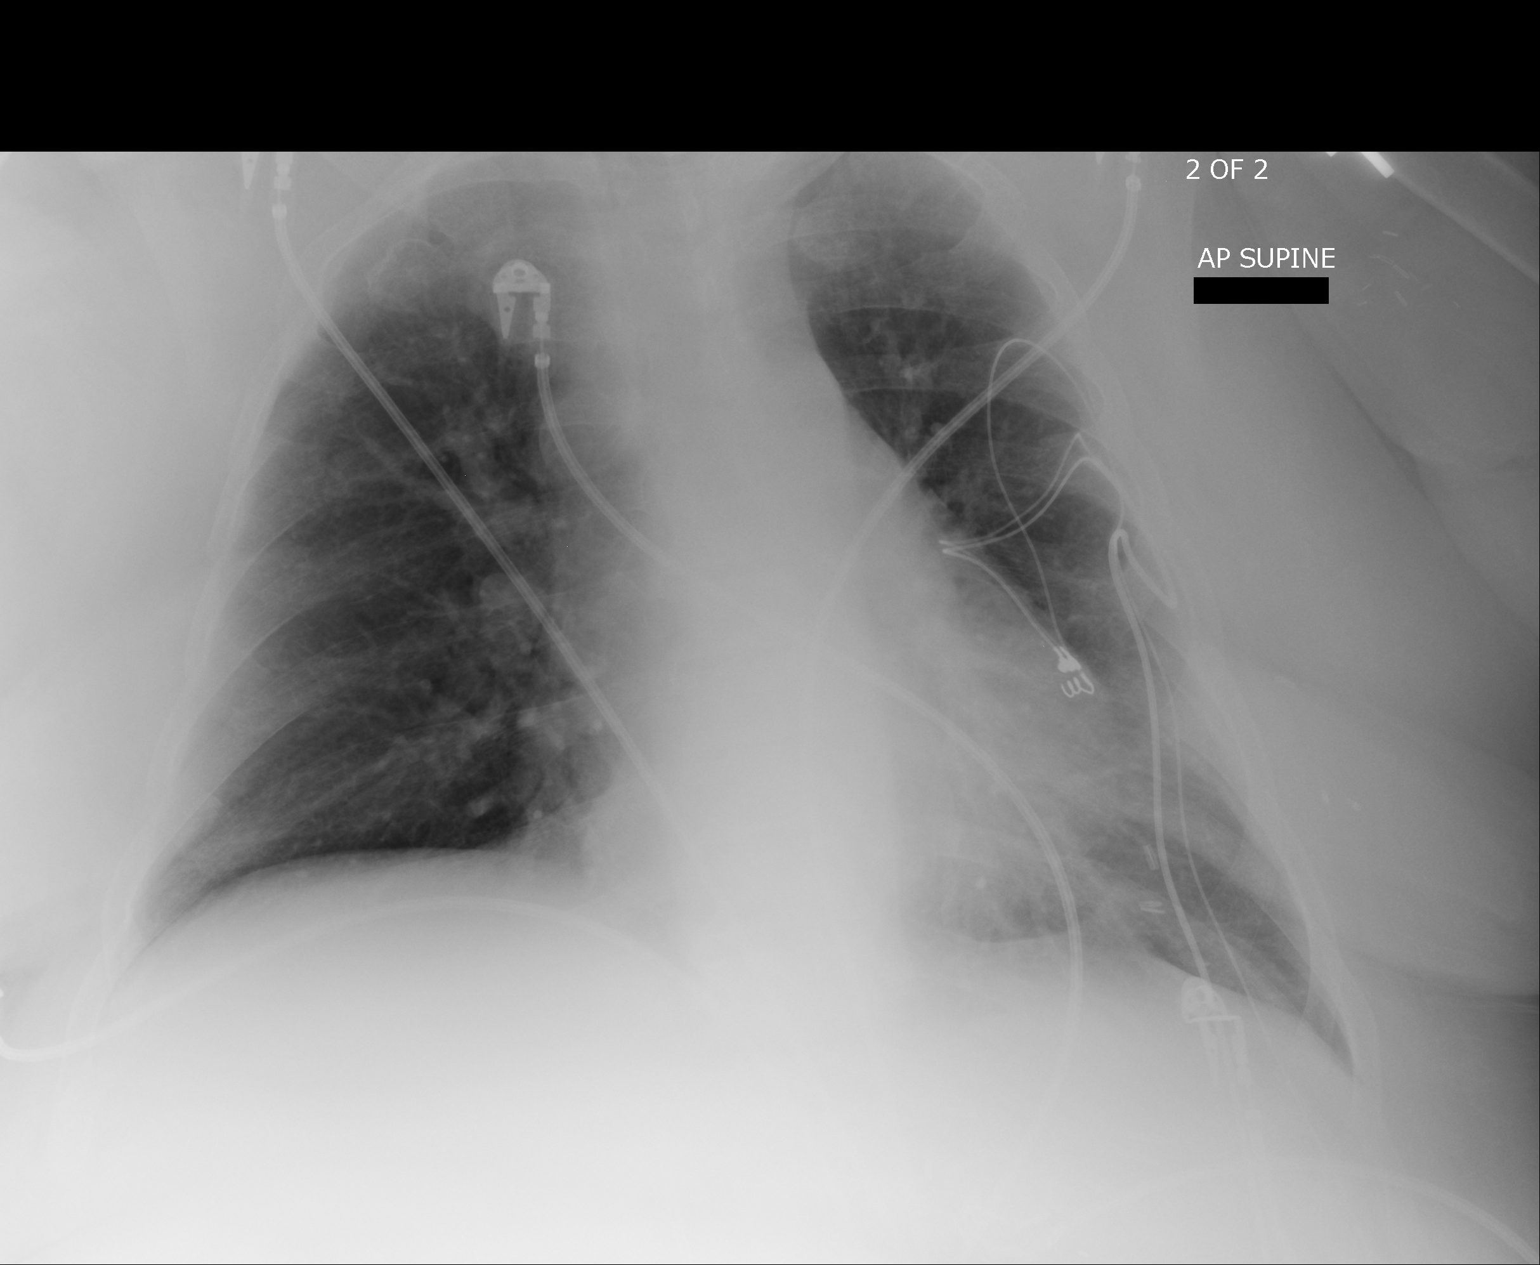

[2 of 2 positions shown; findings below may reference images not displayed]

FINDINGS: The cardiac silhouette is mildly enlarged. There no focal regions of
consolidation or focal infiltrates. The osseous structures
unremarkable. Epicardial pacing leads are appreciated.
IMPRESSION: No evidence of acute cardiopulmonary disease.

## 2015-07-31 IMAGING — CR DG CHEST 1V PORT
1 series · 1 of 1 positions shown · non-contrast
Comparison: 07/06/2013

CLINICAL DATA: Near syncope.  Shortness of breath.  COPD.

EXAM:
PORTABLE CHEST - 1 VIEW

[ap]
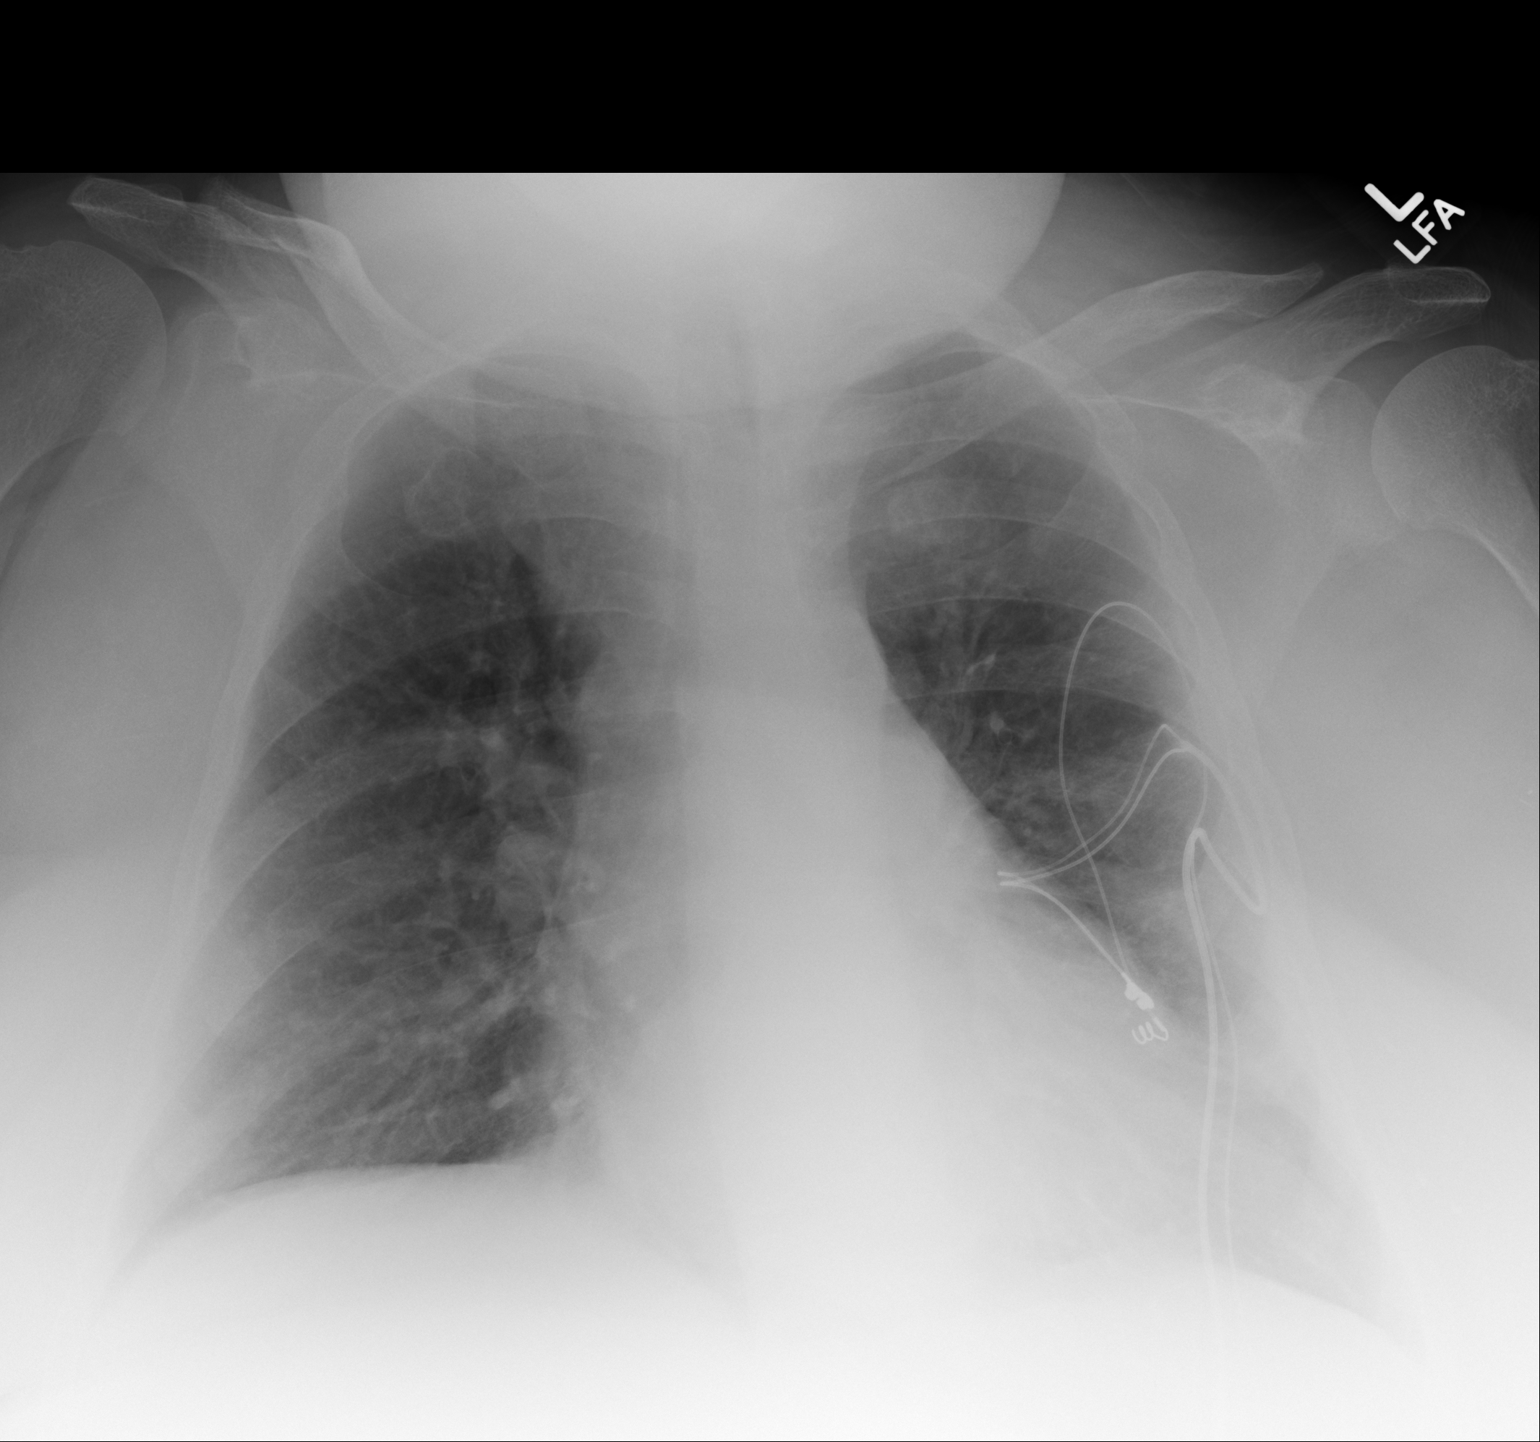

[1 of 1 positions shown; findings below may reference images not displayed]

FINDINGS: Mild cardiomegaly is stable. Epicardial pacemaker leads remain in
stable position. Both lungs are clear. No evidence of pleural
effusion.
IMPRESSION: Stable cardiomegaly.  No active lung disease.

## 2015-08-11 IMAGING — CR DG CHEST 2V
1 series · 4 of 4 positions shown · non-contrast
Comparison: 05/20/2014

CLINICAL DATA: Shortness of breath, COPD

EXAM:
CHEST  2 VIEW

[Series 1: pa · 0.17mm/px · 4 of 4 slices shown]
[im 1/4]
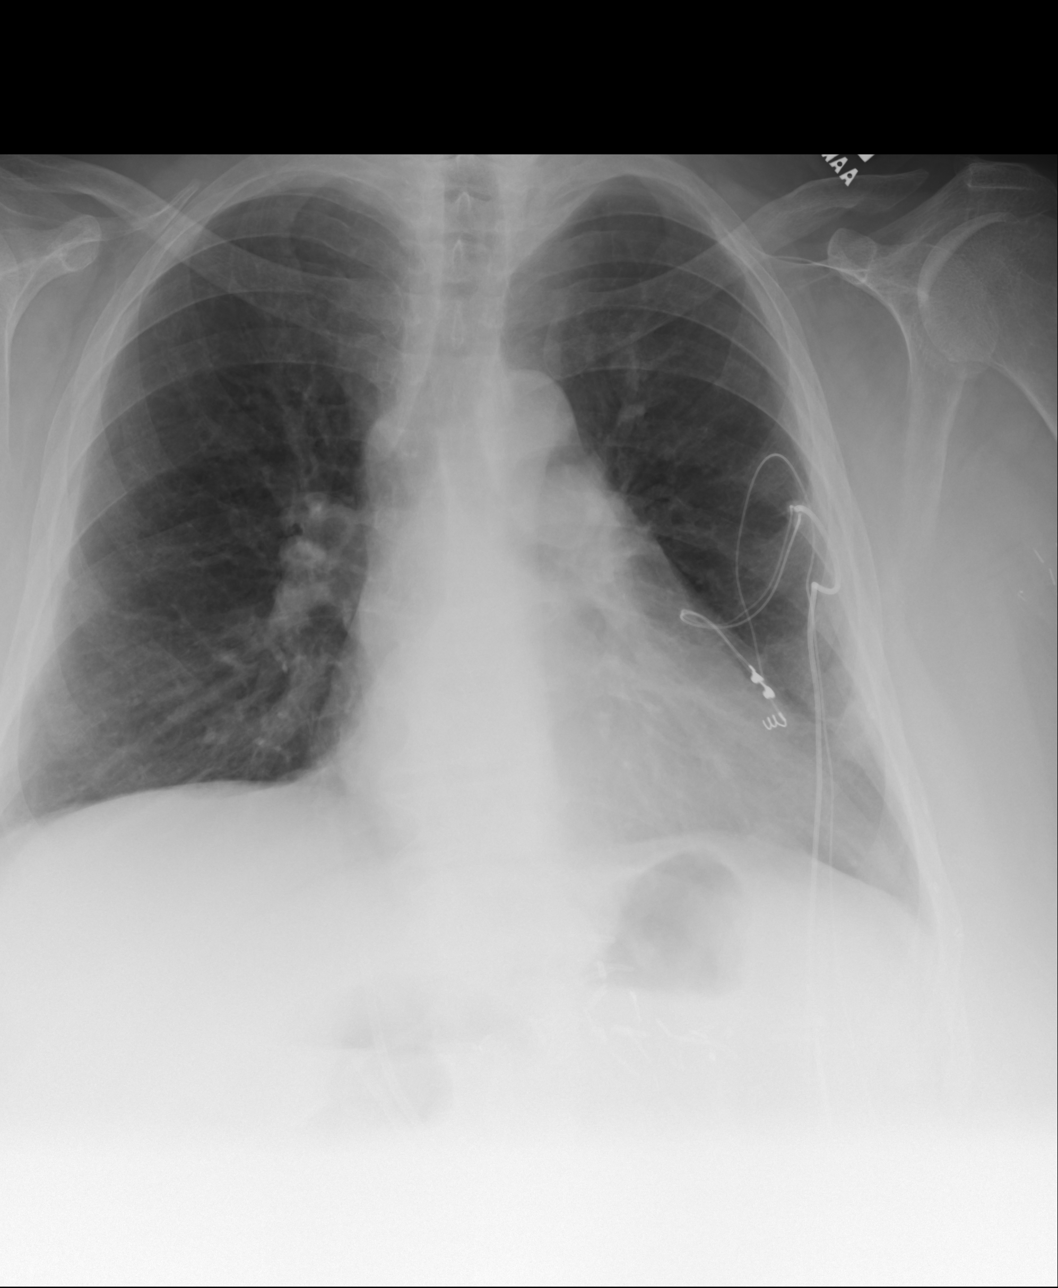
[im 2/4]
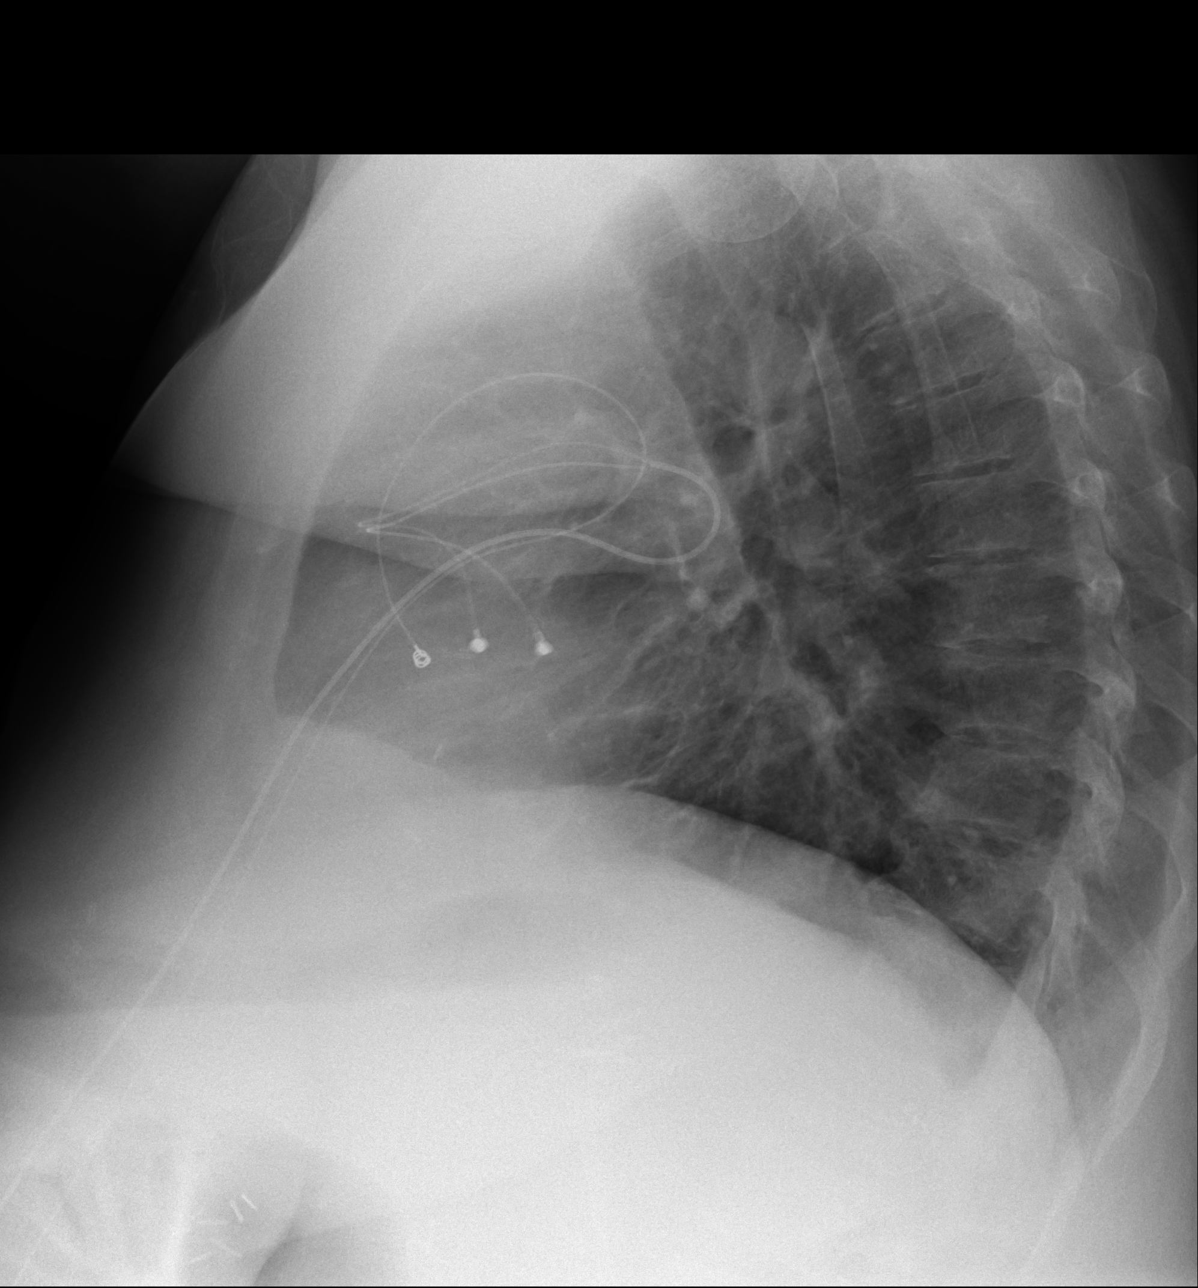
[im 3/4]
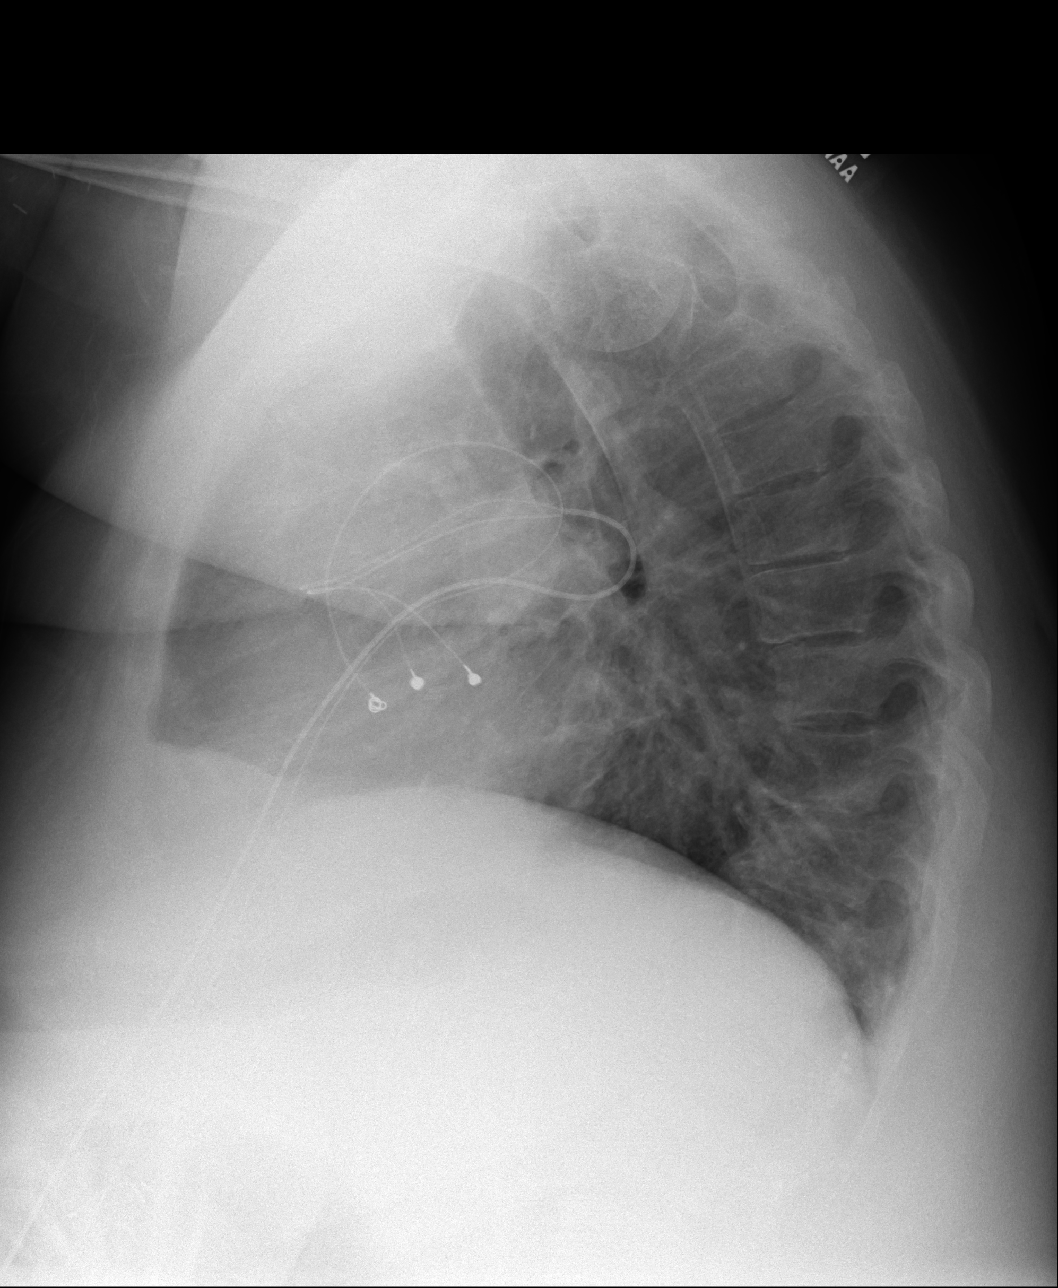
[im 4/4]
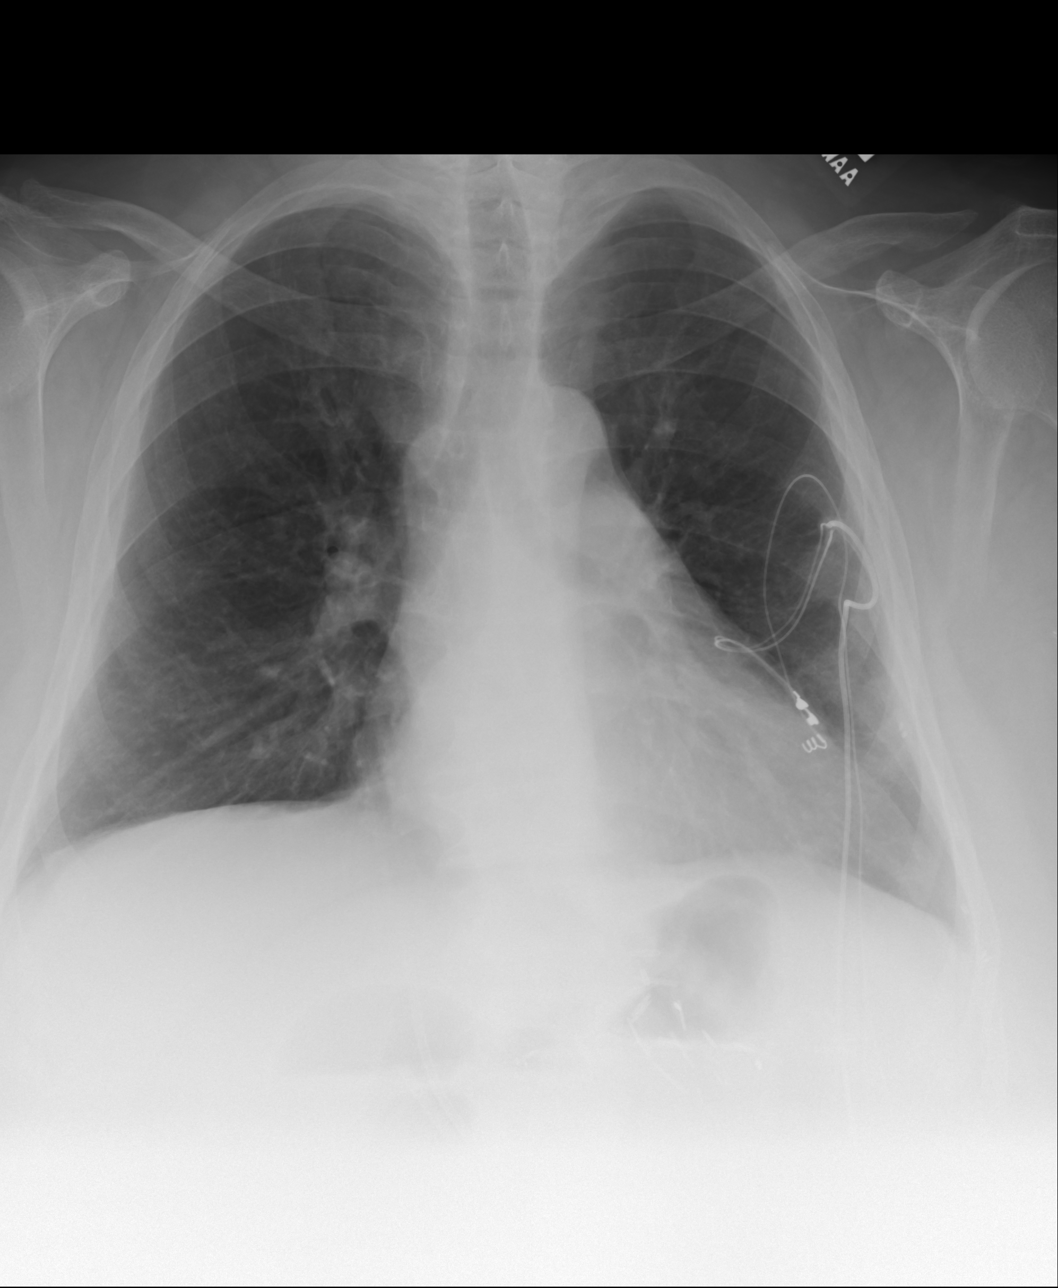

[4 of 4 positions shown; findings below may reference images not displayed]

FINDINGS: Low lung volumes. The cardiac silhouette is enlarged. Epicardial
pacing leads identified on the left which is stable. There no focal
regions of consolidation or focal infiltrates. There is no evidence
of pneumothorax. The osseous structures unremarkable. There is mild
flattening of the hemidiaphragms.
IMPRESSION: No evidence of acute cardiopulmonary disease. Findings which may
reflect component of mild COPD.

## 2015-09-10 IMAGING — CT CT STONE STUDY
3 of 5 series · 17 of 46 positions shown, 19 images · non-contrast
Comparison: None.

CLINICAL DATA: Left lower quadrant abdomen pain.

EXAM:
CT ABDOMEN AND PELVIS WITHOUT CONTRAST
TECHNIQUE: Multidetector CT imaging of the abdomen and pelvis was performed
following the standard protocol without IV contrast.

[Series 2: stone standard full · axial · 0.98mm/px · z∈[-1150,-746]mm · 12 of 97 slices shown, 14 images]
[im 8/97  soft-tissue]
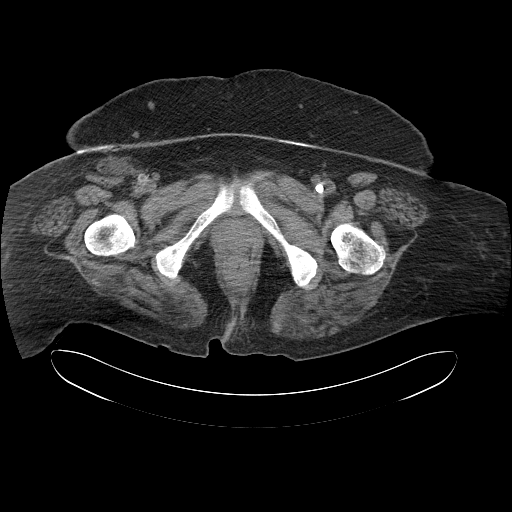
[im 8/97  bone]
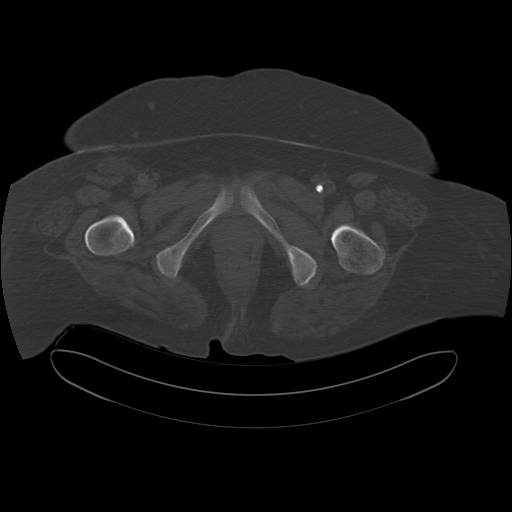
[im 15/97  soft-tissue]
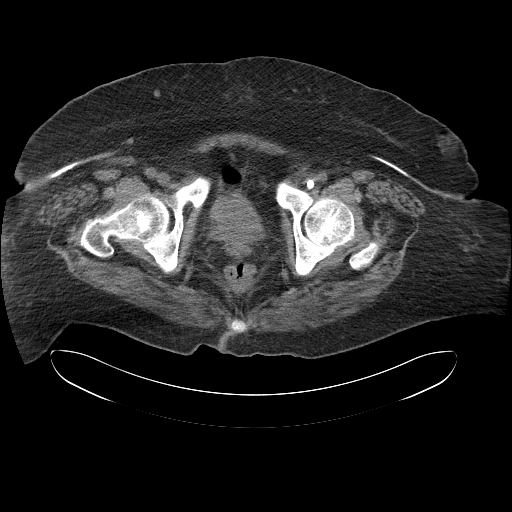
[im 23/97  soft-tissue]
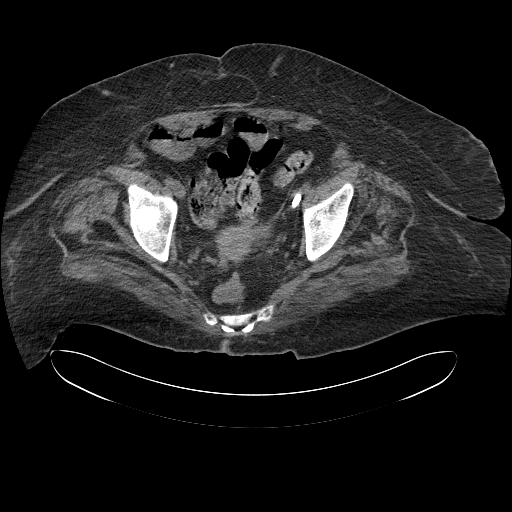
[im 30/97  soft-tissue]
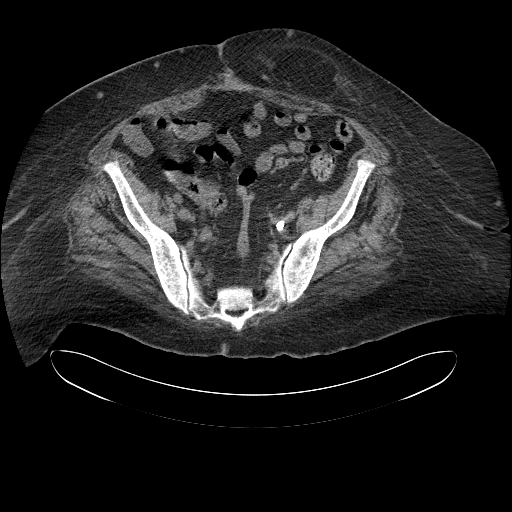
[im 37/97  soft-tissue]
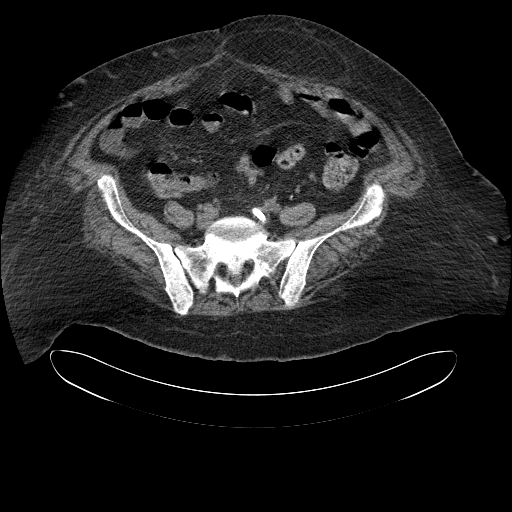
[im 45/97  soft-tissue]
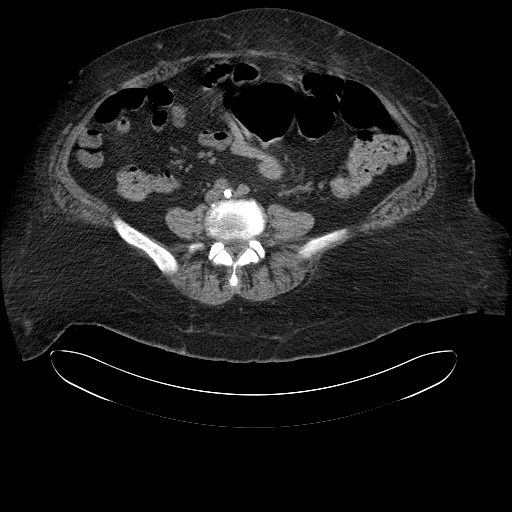
[im 52/97  soft-tissue]
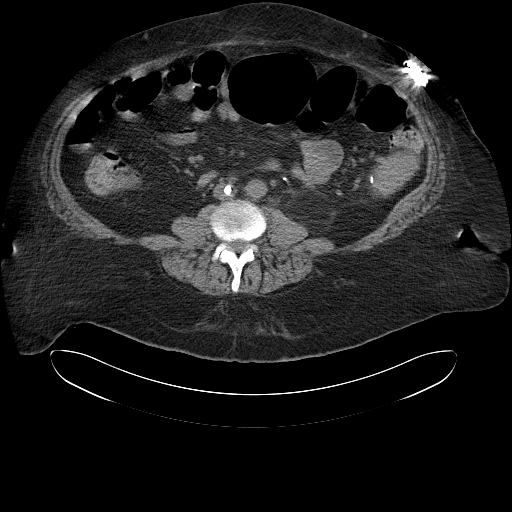
[im 60/97  soft-tissue]
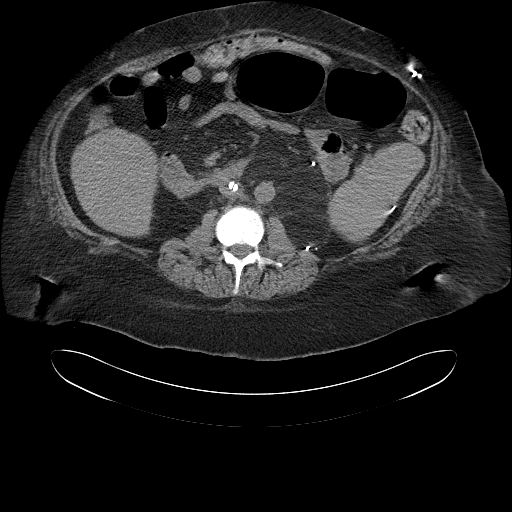
[im 67/97  soft-tissue]
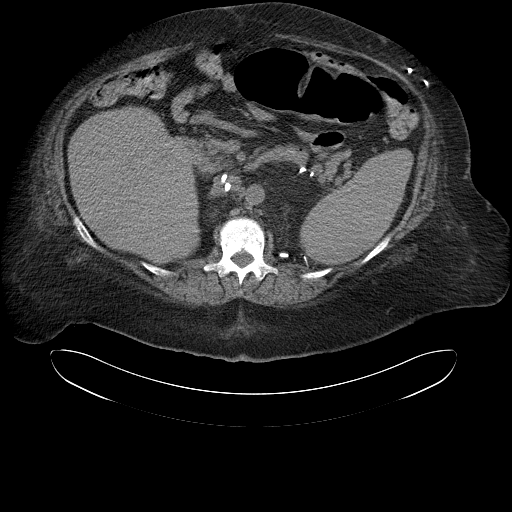
[im 67/97  bone]
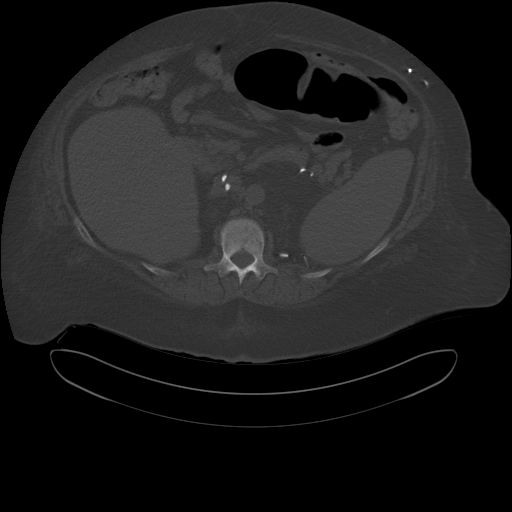
[im 74/97  soft-tissue]
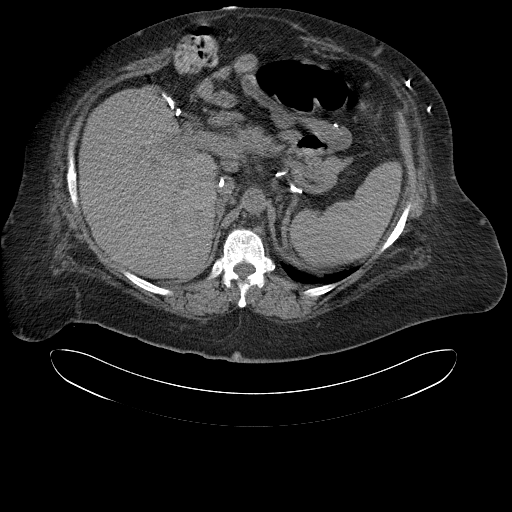
[im 82/97  soft-tissue]
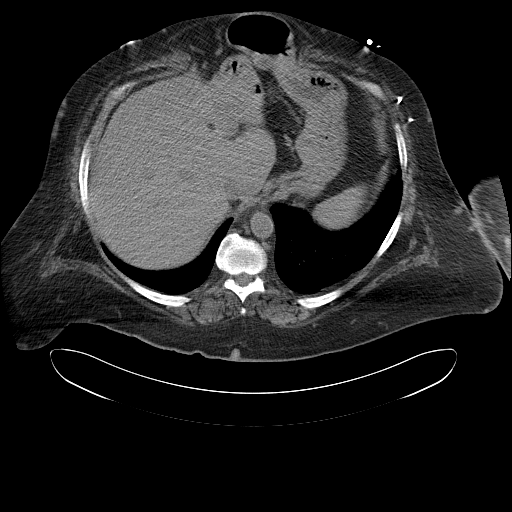
[im 89/97  soft-tissue]
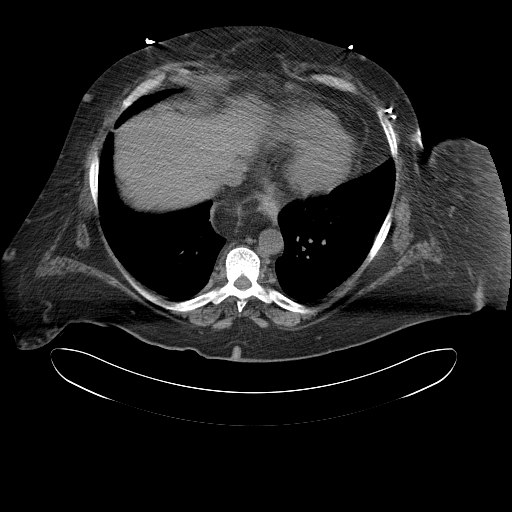

[Series 4: lung · axial · 0.98mm/px · z∈[-790,-750]mm · 2 of 26 slices shown]
[im 9/26  bone]
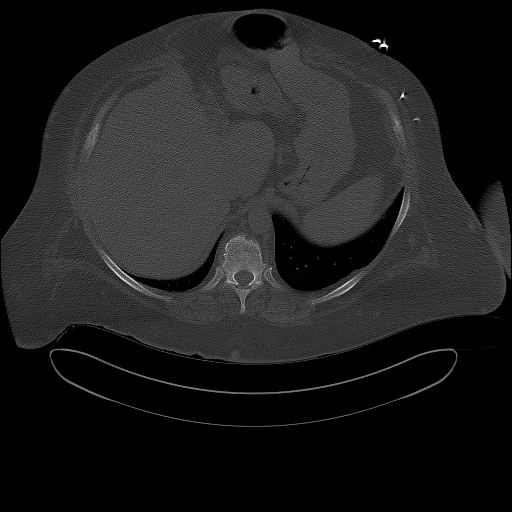
[im 17/26  bone]
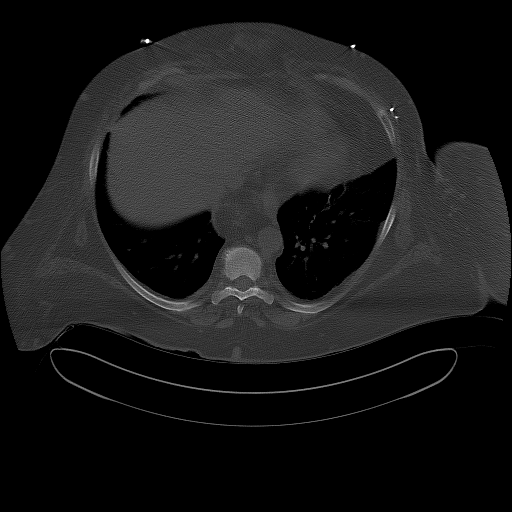

[Series 5: cor stone standard full · coronal · 0.97mm/px · 3 of 167 slices shown]
[im 56/167  soft-tissue]
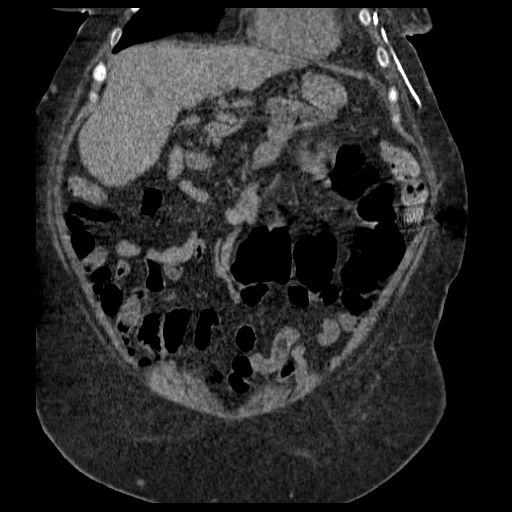
[im 74/167  soft-tissue]
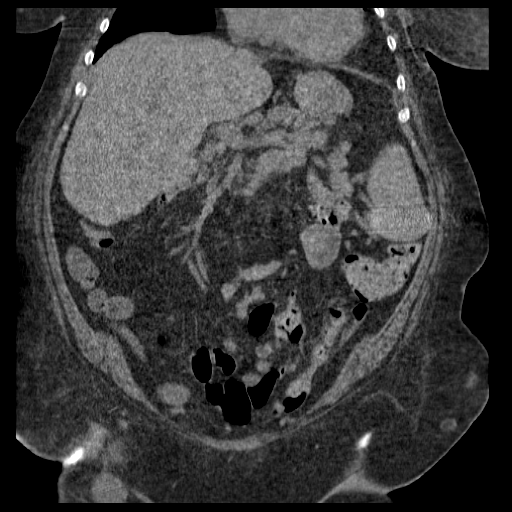
[im 93/167  soft-tissue]
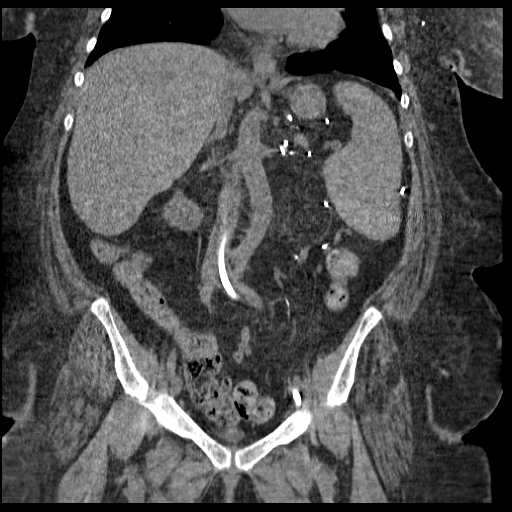

[17 of 46 positions shown; findings below may reference images not displayed]

FINDINGS: The liver, spleen, pancreas are normal. Patient is status post prior
cholecystectomy. Patient is status post prior left nephrectomy.
There are surgical clips in the right renal fossae with no normal
right kidney identified. There is a 1.5 x 1.5 cm densely calcified
nodule in the right renal fossa question very atrophic right kidney
vs scar from prior right nephrectomy. There is no small bowel
obstruction or diverticulitis. The appendix is not seen but no
inflammation is noted around the cecum. There are ventral hernias of
the upper abdomen and the lower pelvis.

Decompressed bladder limits evaluation. The uterus is atrophic. A
left femoral vein catheter is identified with distal tip in the
inferior vena cava. The visualized lung bases are clear.
Degenerative joint changes of the spine are noted. There is
generalized increased density of the visualized bones.
IMPRESSION: No acute abnormality identified in the abdomen and pelvis. There is
no evidence of bowel obstruction or diverticulitis.

## 2015-09-10 IMAGING — CR DG CHEST 1V PORT
1 series · 1 of 1 positions shown · non-contrast
Comparison: DG CHEST 2V dated 08/10/2013; DG CHEST 1V PORT dated
07/30/2013; DG CHEST 1V PORT dated 07/06/2013

CLINICAL DATA: Low blood pressure, shortness of breath

EXAM:
PORTABLE CHEST - 1 VIEW

[ap]
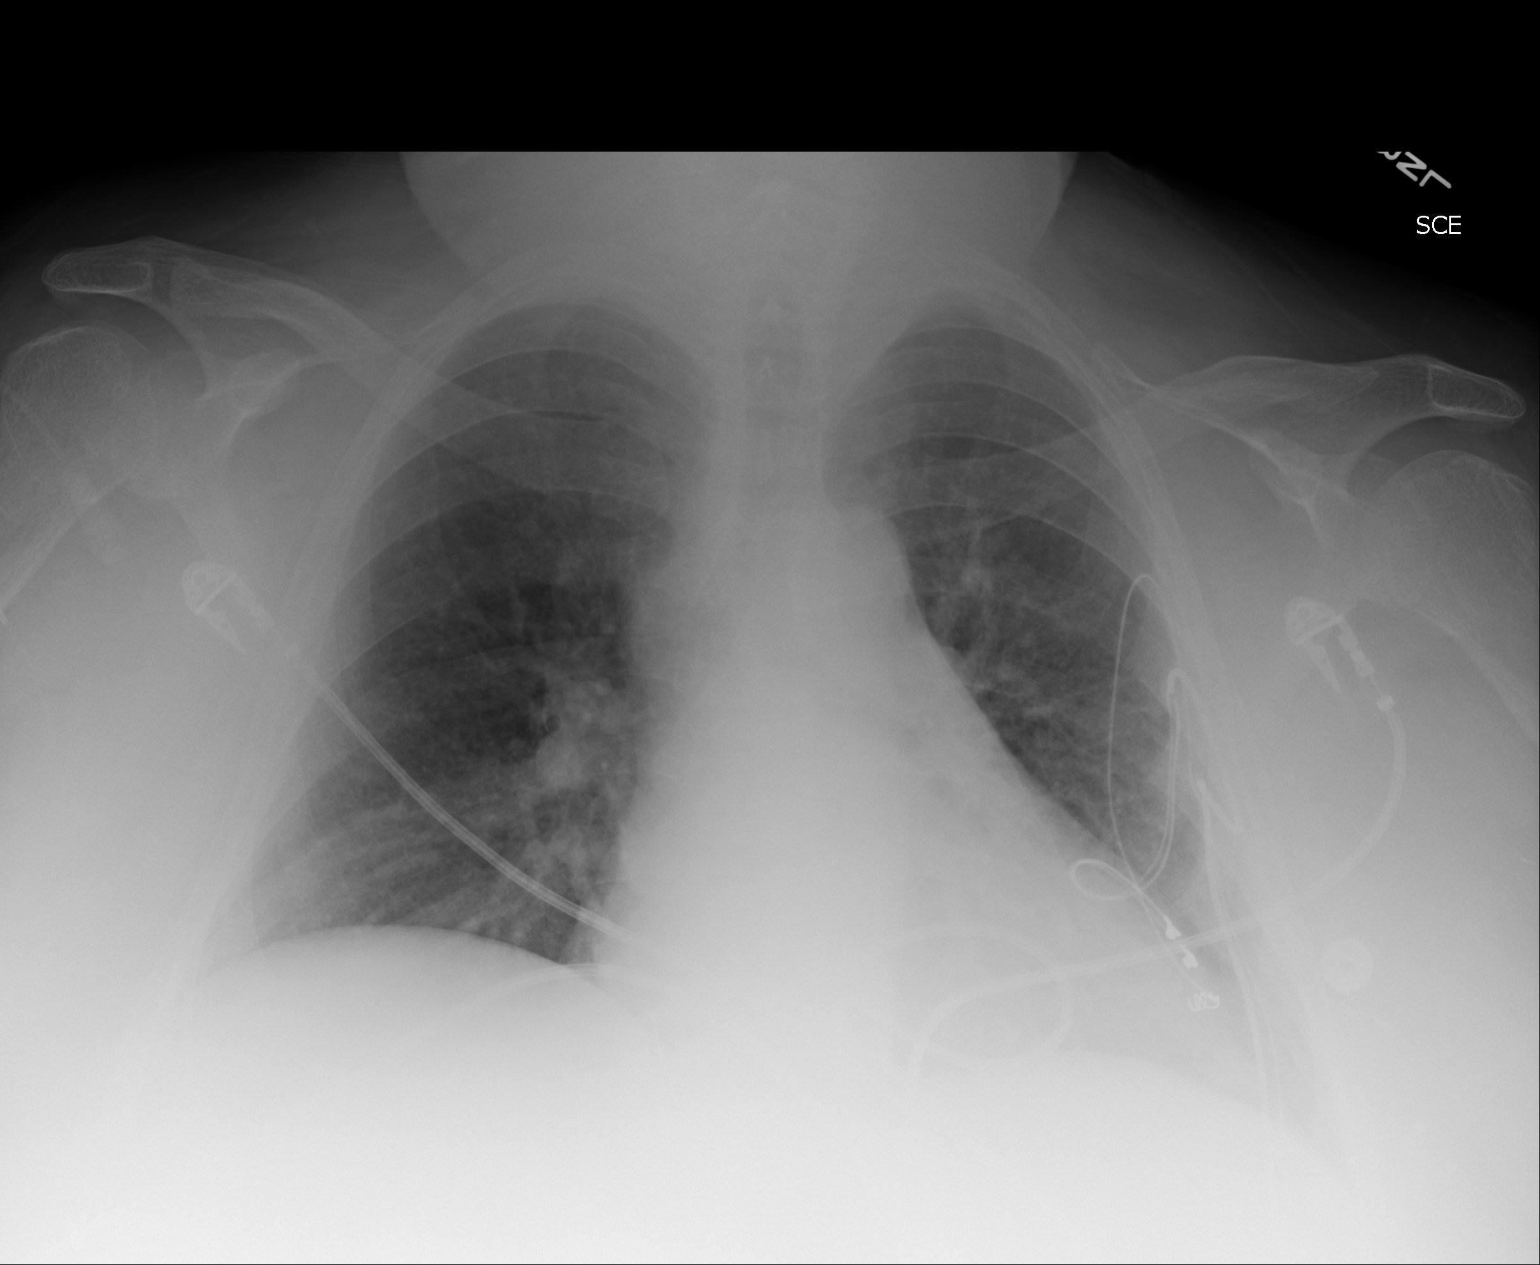

[1 of 1 positions shown; findings below may reference images not displayed]

FINDINGS: The heart size and mediastinal contours are within normal limits.
Both lungs are clear. The visualized skeletal structures are
unremarkable.
IMPRESSION: No active disease.

## 2015-09-12 IMAGING — CR DG ABDOMEN 1V
1 series · 2 of 2 positions shown · non-contrast
Comparison: CT abdomen and pelvis 09/09/2013

CLINICAL DATA: Abdominal pain.

EXAM:
ABDOMEN - 1 VIEW

[Series 1: ap · 0.17mm/px · 2 of 2 slices shown]
[im 1/2]
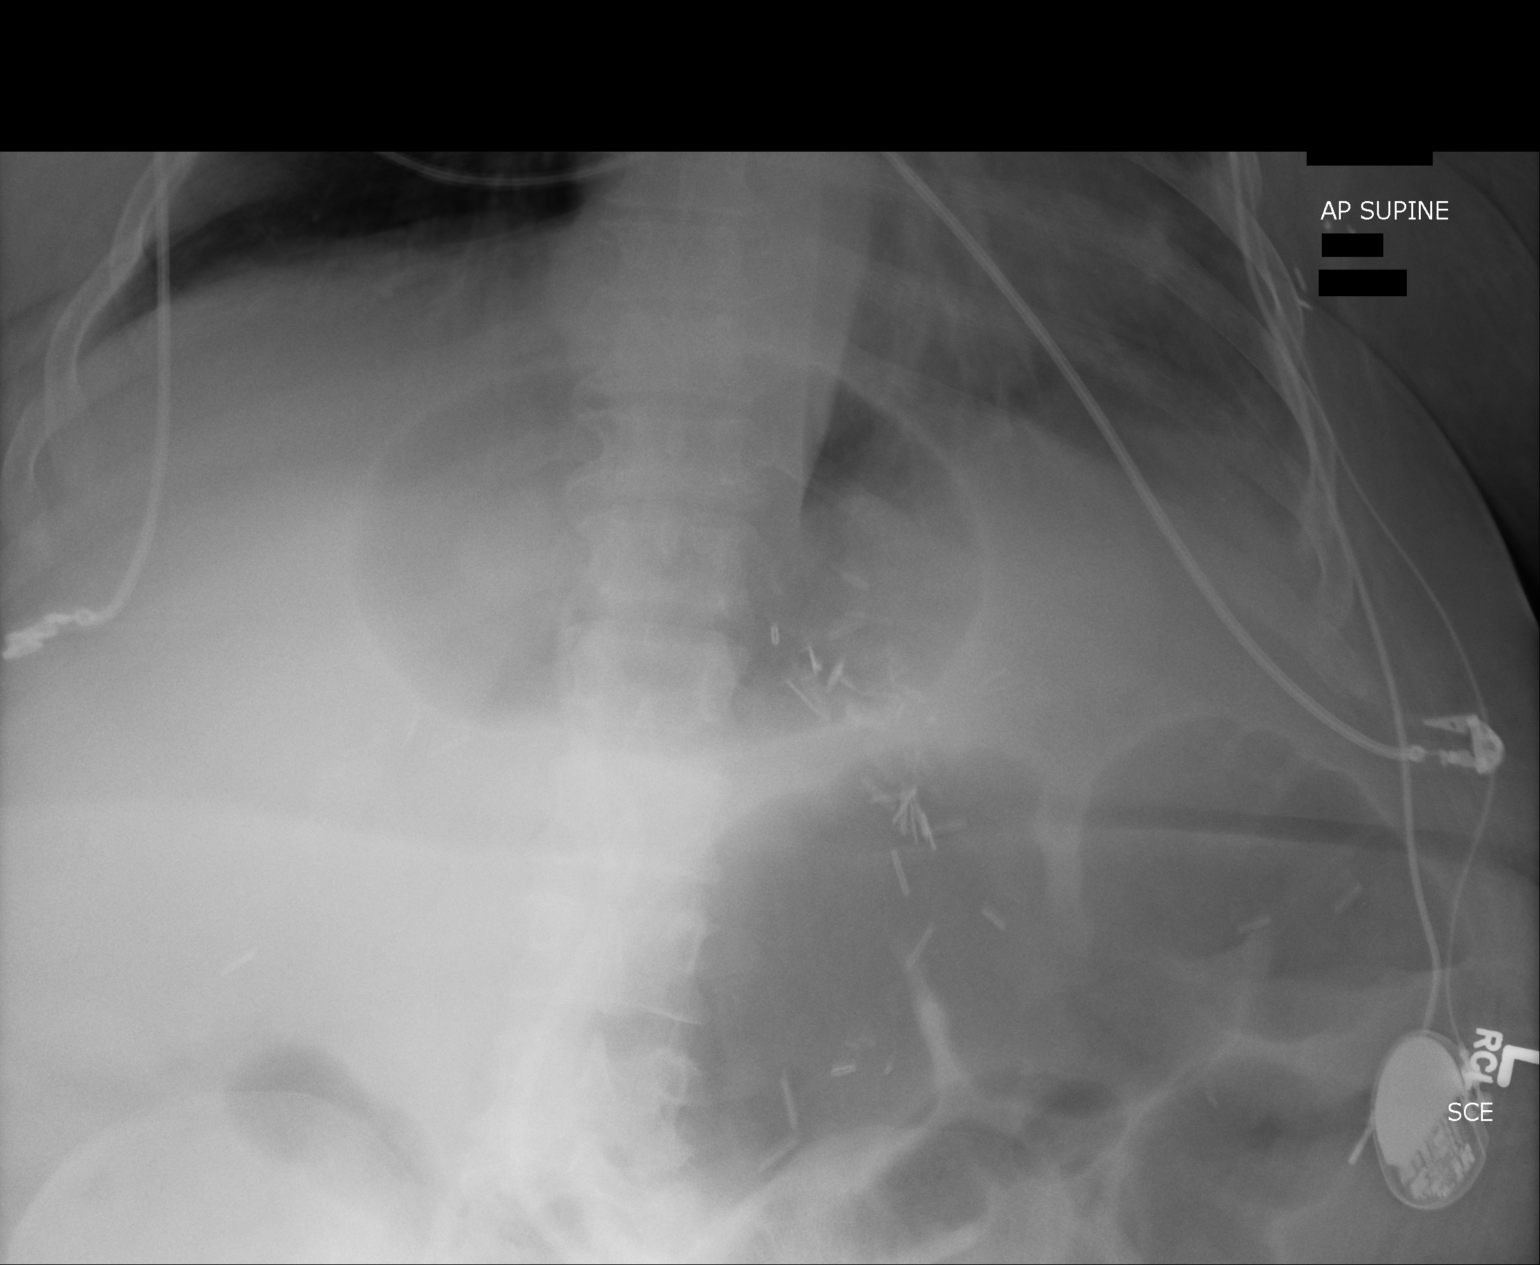
[im 2/2]
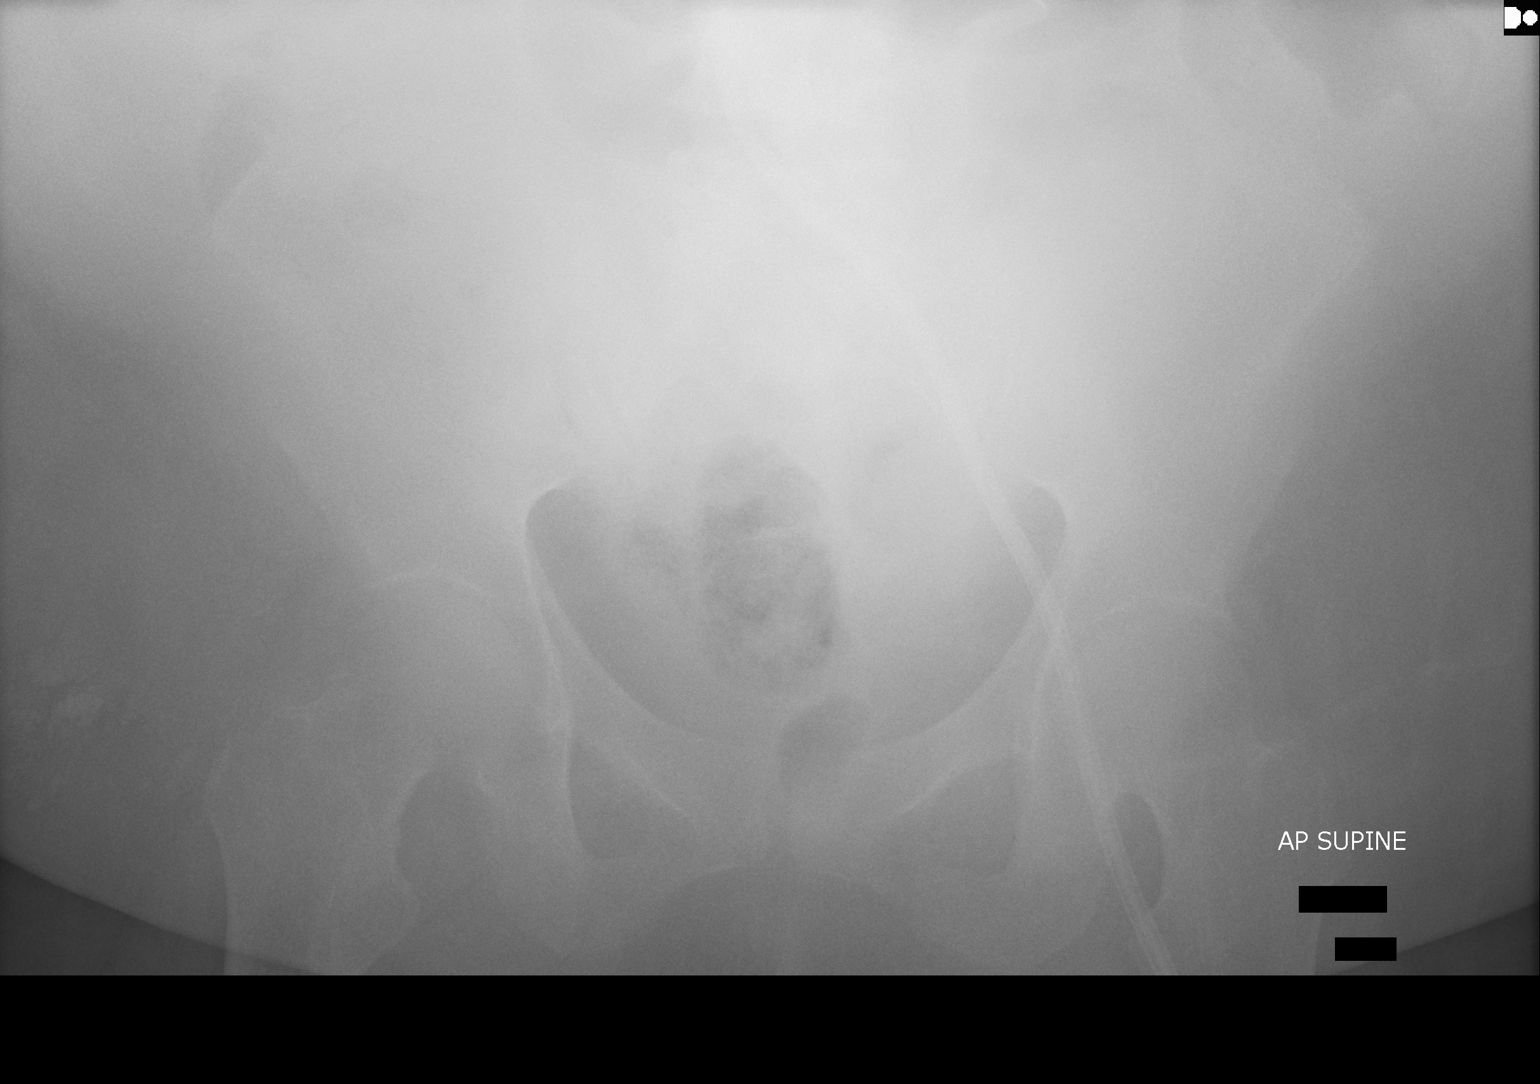

[2 of 2 positions shown; findings below may reference images not displayed]

FINDINGS: Oval gas collection in the midline of the upper abdomen likely
represents stomach within the previously described ventral hernia,
slightly more distended than on prior CT. There is mild gaseous
distension of splenic flexure, similar to prior CT. A small amount
of gas and stool are present the rectum. No dilated small bowel
loops are seen. Surgical clips are seen throughout the abdomen. No
gross intraperitoneal free air is identified. Left femoral approach
venous catheter is present.
IMPRESSION: Nonspecific bowel gas pattern.

## 2015-09-16 IMAGING — XA IR VASCULAR PROCEDURE
15 of 19 series · 15 of 24 positions shown · IV contrast (IODINE)
Comparison: none

[Series 1: care upper arm · 3 acquisitions, 1 frame shown]
[im 1/3]
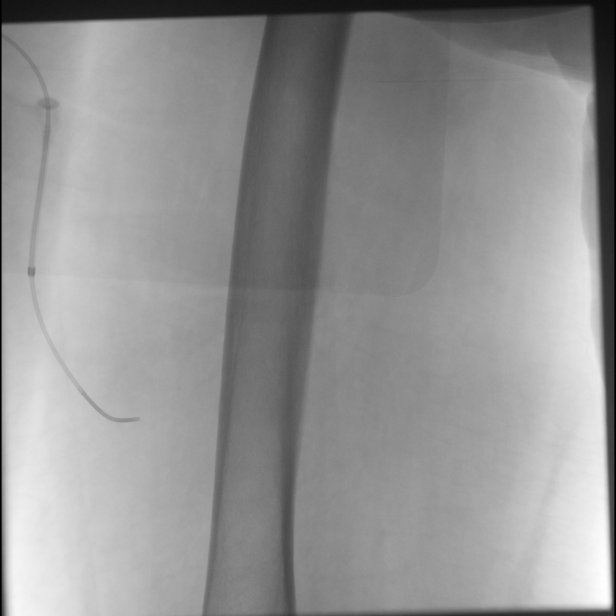

[Series 2: care aorta · 3 acquisitions, 1 frame shown]
[im 1/3]
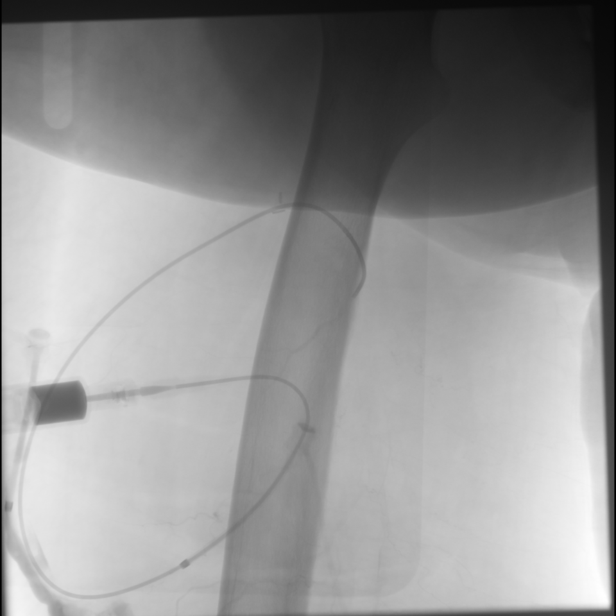

[Series 4: aorta · 3 acquisitions, 1 frame shown (1 of 11)]
[im 1/3]
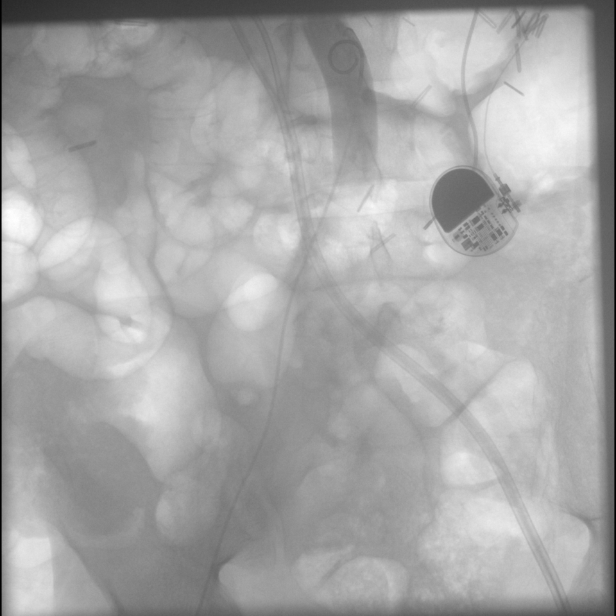

[Series 5: aorta · 3 acquisitions, 1 frame shown (2 of 11)]
[im 1/3]
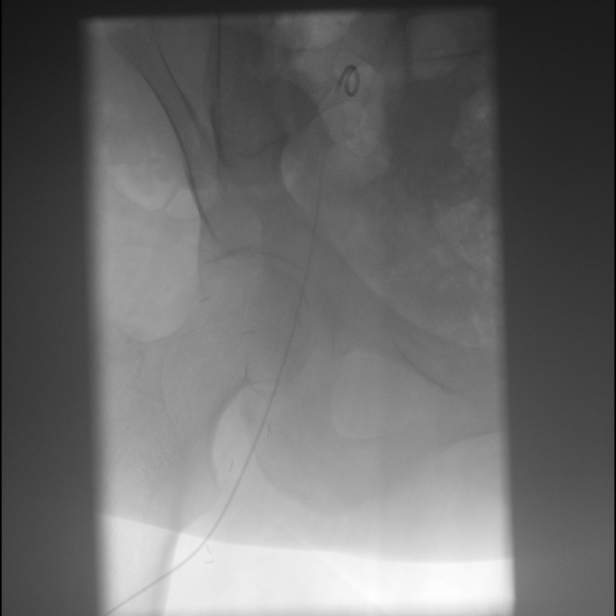

[Series 6: aorta · 2 acquisitions, 1 frame shown (3 of 11)]
[im 1/2]
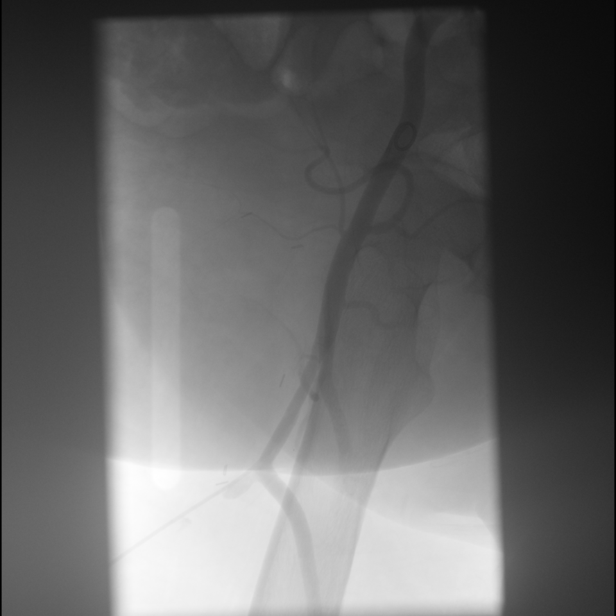

[Series 7: aorta · 3 acquisitions, 1 frame shown (4 of 11)]
[im 1/3]
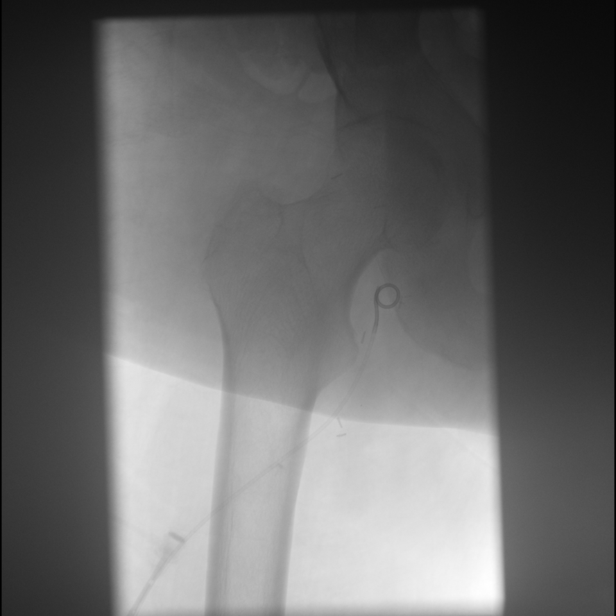

[Series 8: aorta · 3 acquisitions, 1 frame shown (5 of 11)]
[im 1/3]
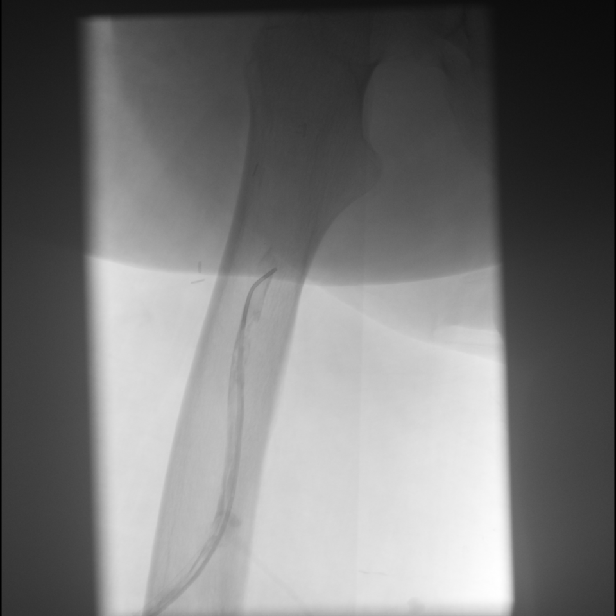

[Series 10: aorta · 3 acquisitions, 1 frame shown (6 of 11)]
[im 1/3]
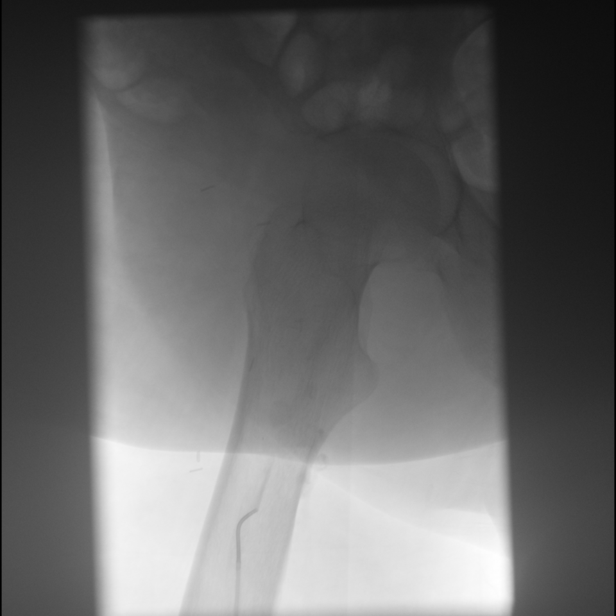

[Series 11: aorta · 3 acquisitions, 1 frame shown (7 of 11)]
[im 1/3]
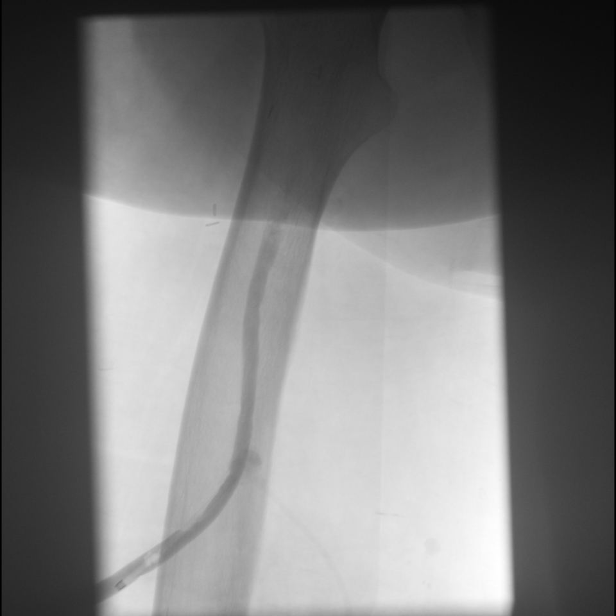

[Series 15: fl - angio · 1 of 1 slices shown (1 of 2)]
[im 1/1]
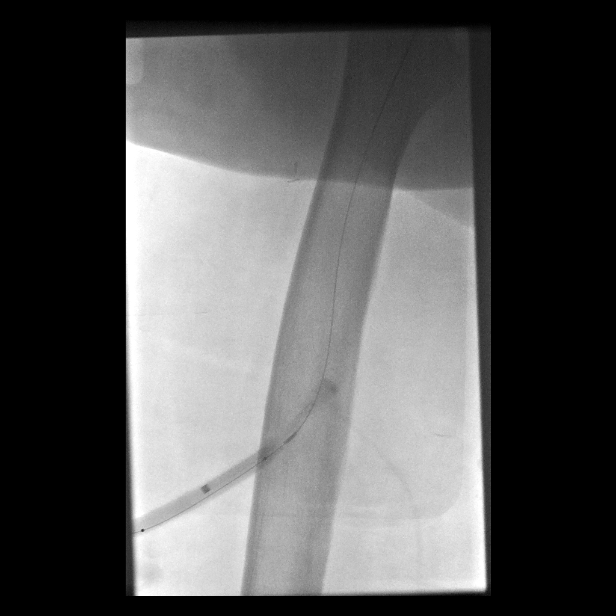

[Series 17: aorta · 3 acquisitions, 1 frame shown (8 of 11)]
[im 1/3]
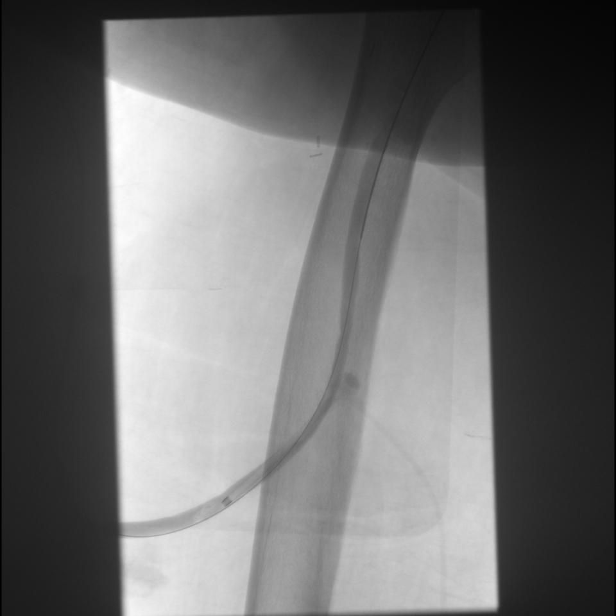

[Series 18: aorta · 3 acquisitions, 1 frame shown (9 of 11)]
[im 1/3]
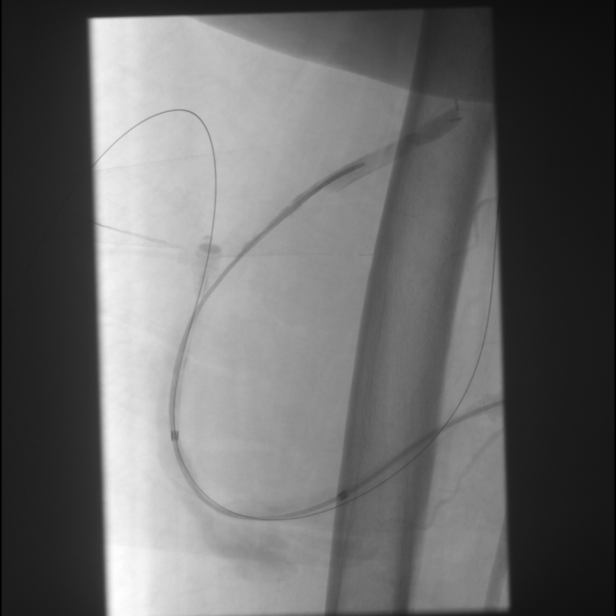

[Series 21: aorta · 3 acquisitions, 1 frame shown (10 of 11)]
[im 1/3]
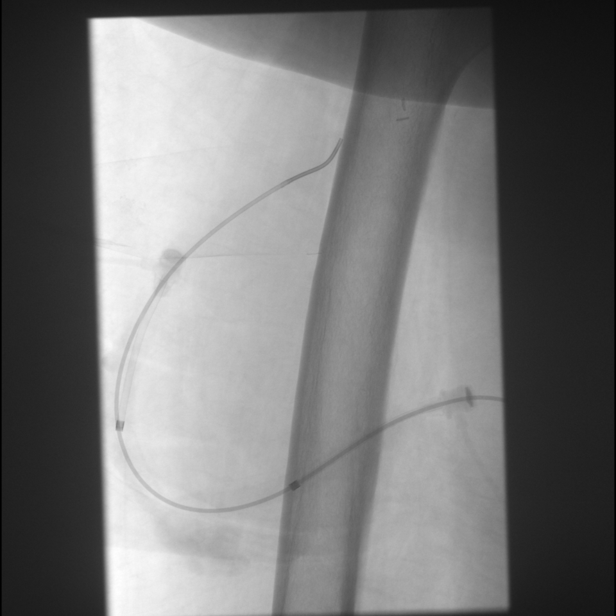

[Series 22: aorta · 3 acquisitions, 1 frame shown (11 of 11)]
[im 1/3]
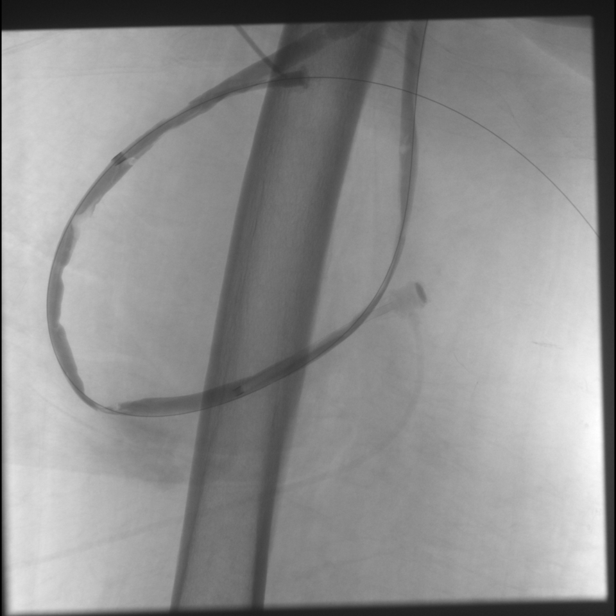

[Series 24: fl - angio · 1 of 1 slices shown (2 of 2)]
[im 1/1]
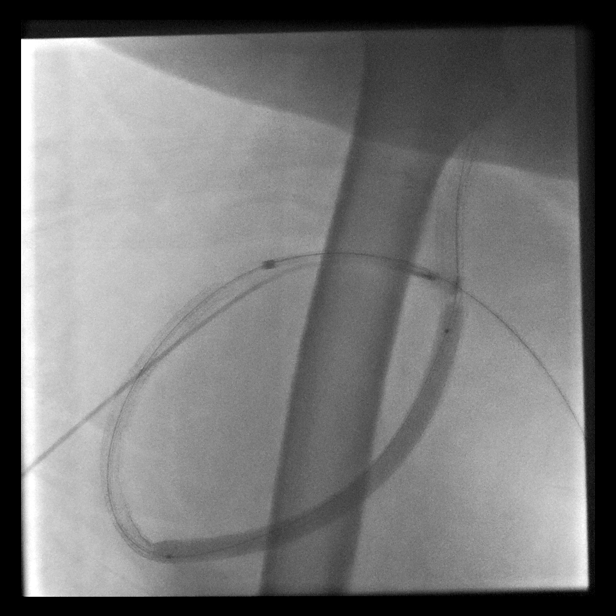

[15 of 24 positions shown; findings below may reference images not displayed]

IMAGES IMPORTED FROM THE [employer] WORK[employer]OW SYSTEM
NO DICTATION FOR STUDY

## 2015-10-21 IMAGING — CR DG CHEST 1V PORT
1 series · 1 of 1 positions shown · non-contrast
Comparison: 09/09/2013

CLINICAL DATA: Chest pain with complaints of pacer spiking.

EXAM:
PORTABLE CHEST - 1 VIEW

[ap]
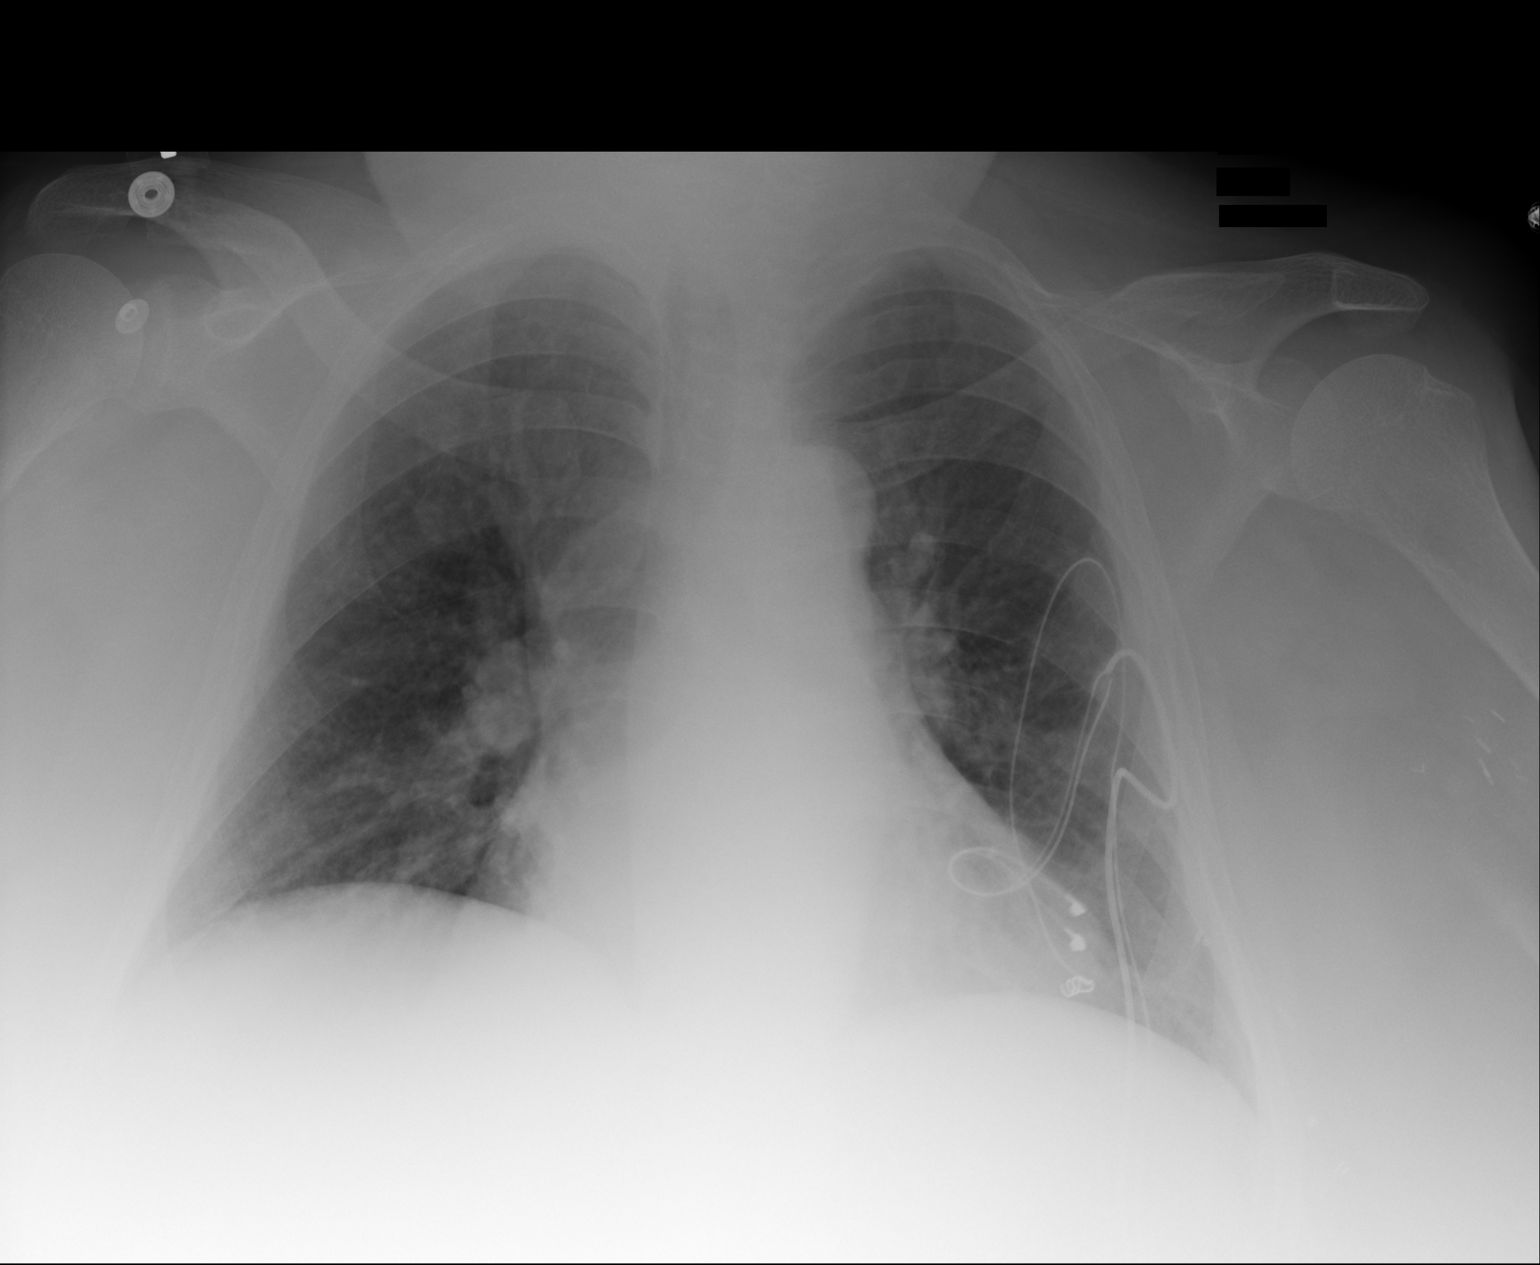

[1 of 1 positions shown; findings below may reference images not displayed]

FINDINGS: There are pacer wires over the left chest wall unchanged. Lungs are
adequately inflated as patient is slightly rotated to the right.
There is mild prominence of the perihilar markings suggesting mild
vascular congestion. There is no focal consolidation or effusion.
There is mild cardiomegaly. Remainder the exam is unchanged.
IMPRESSION: Suggestion minimal vascular congestion.  Mild stable cardiomegaly.

## 2016-03-09 IMAGING — CR DG CHEST 1V PORT
1 series · 1 of 1 positions shown · non-contrast
Comparison: 10/20/2013.

CLINICAL DATA: Chest pain.

EXAM:
PORTABLE CHEST - 1 VIEW

[ap]
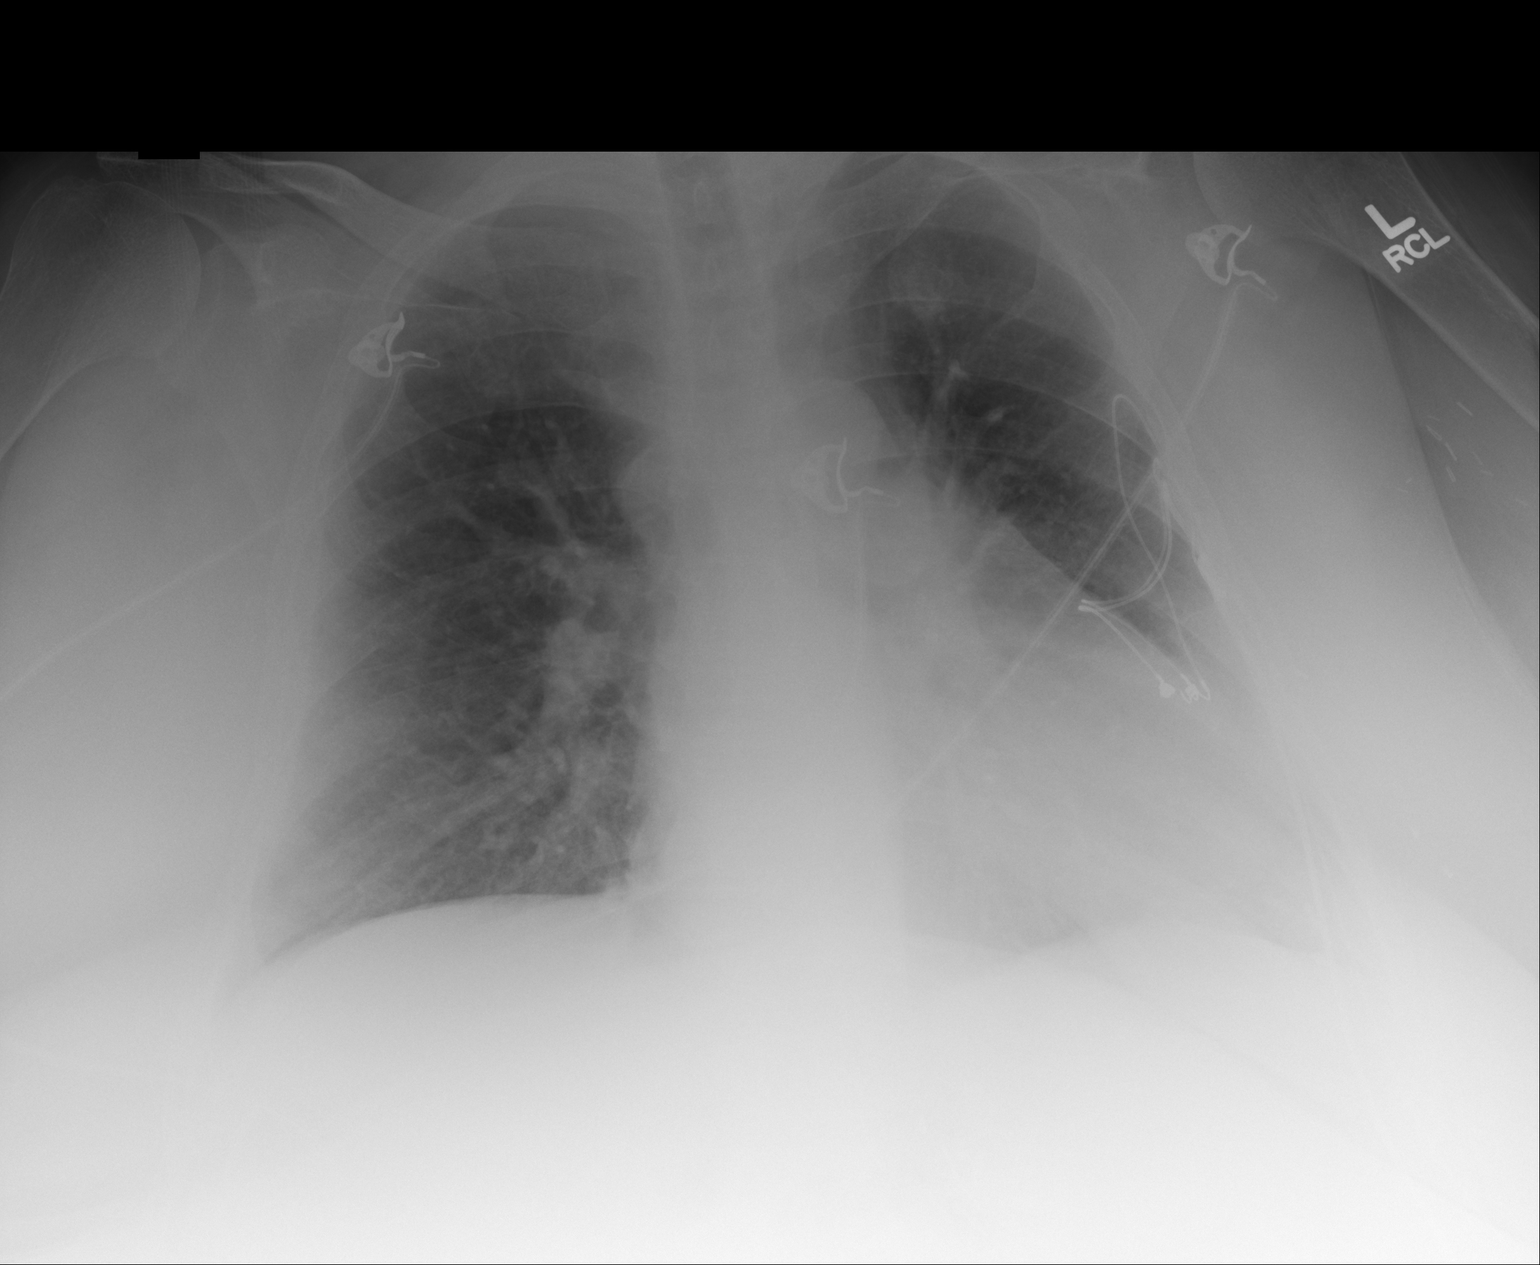

[1 of 1 positions shown; findings below may reference images not displayed]

FINDINGS: Mediastinum and hilar structures are normal. Prominent costochondral
calcification. No pleural effusion or pneumothorax. No acute osseous
abnormality.
IMPRESSION: No acute abnormality .

## 2016-03-13 IMAGING — CR SACRUM AND COCCYX - 2+ VIEW
1 series · 3 of 3 positions shown · non-contrast
Comparison: None.

CLINICAL DATA: Fall, low back pain.

EXAM:
SACRUM AND COCCYX - 2+ VIEW

[Series 1: t sacrum ap · 0.14mm/px · 3 of 3 slices shown]
[im 1/3]
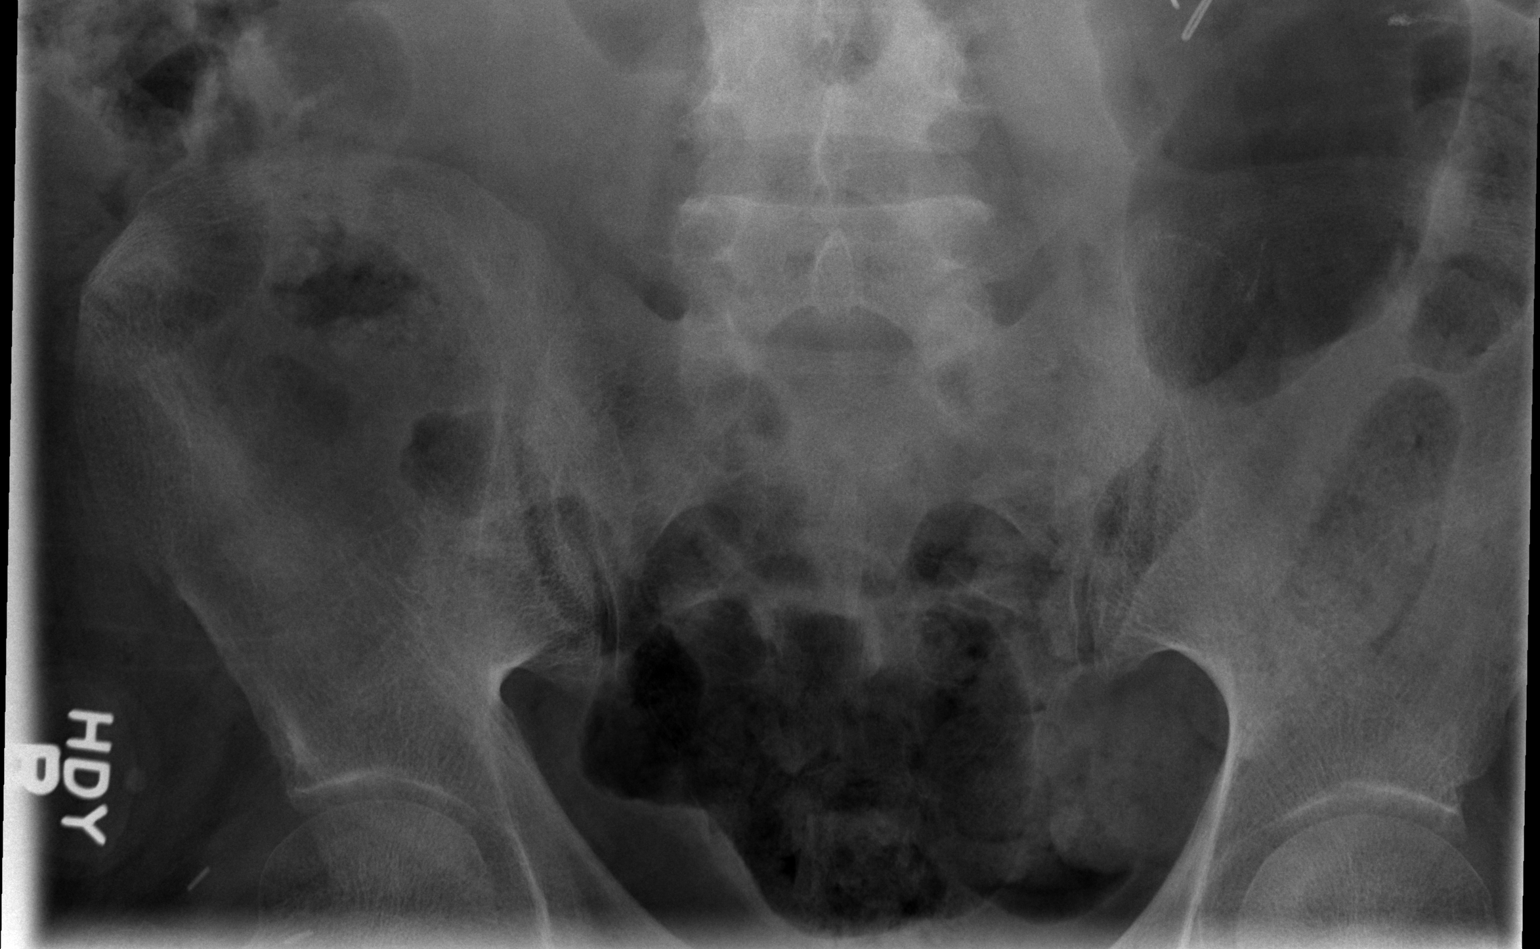
[im 2/3]
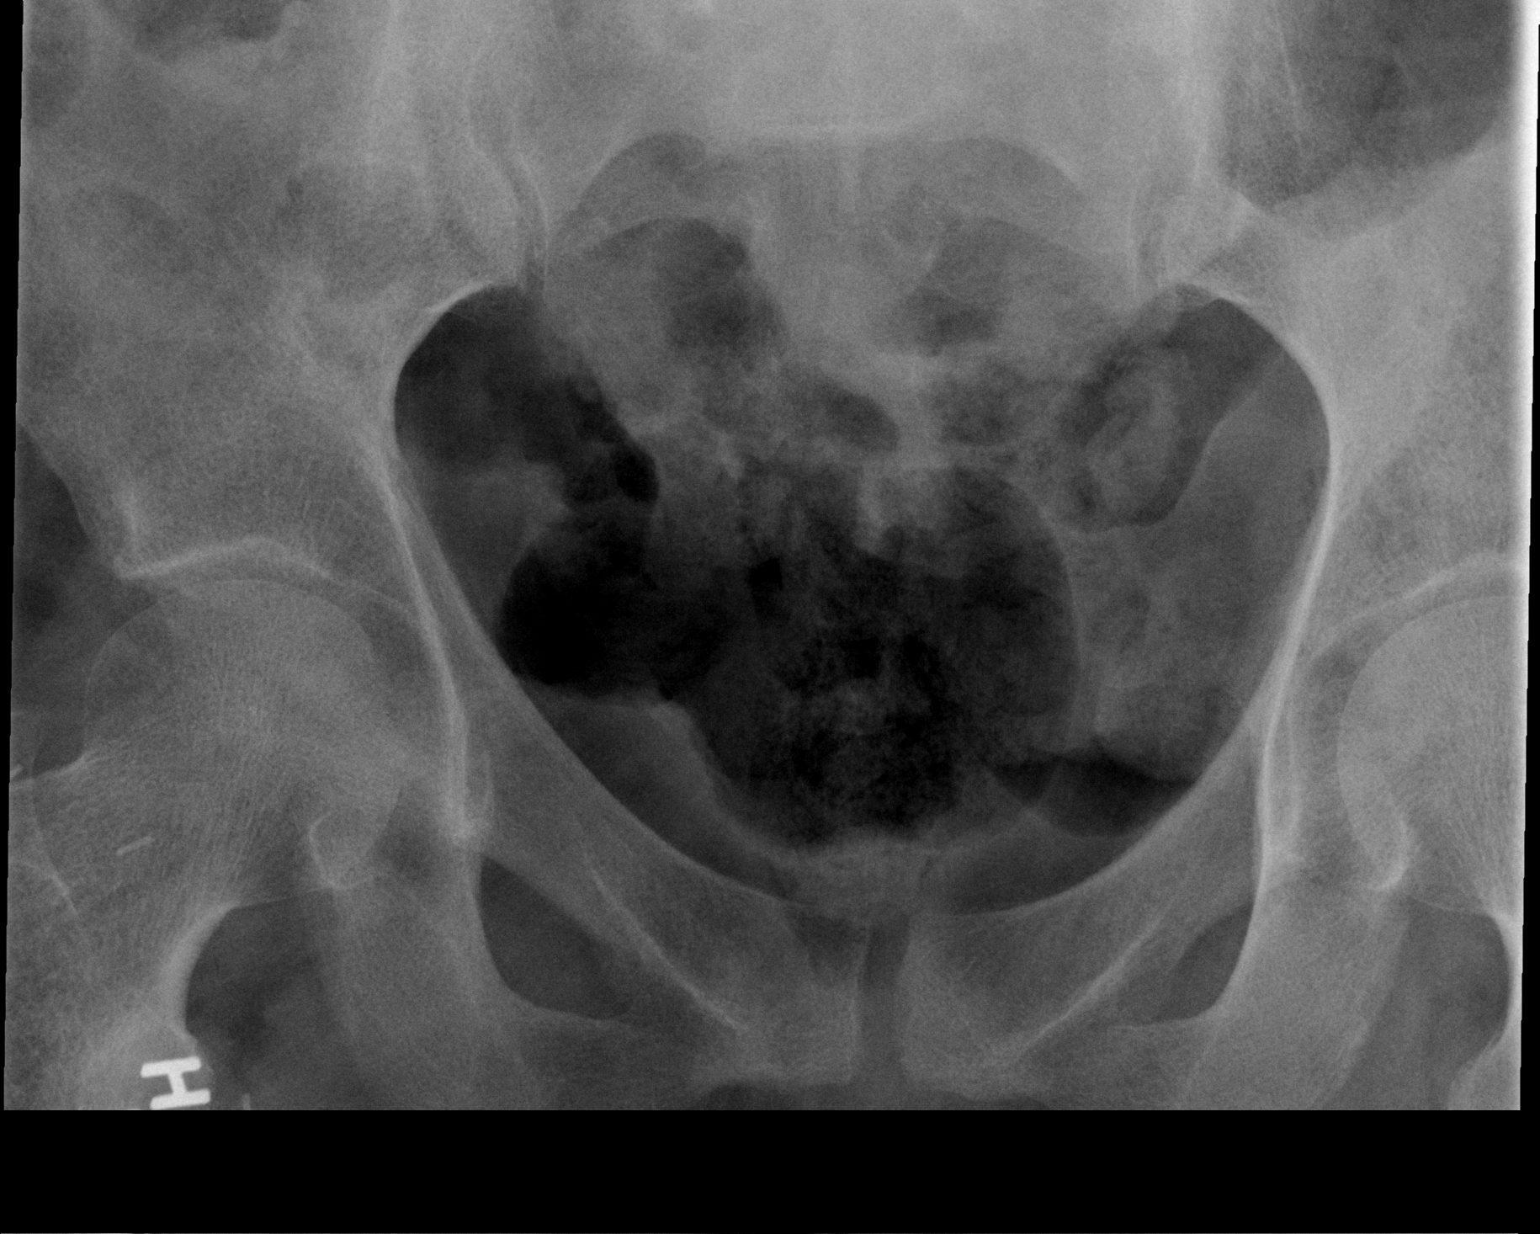
[im 3/3]
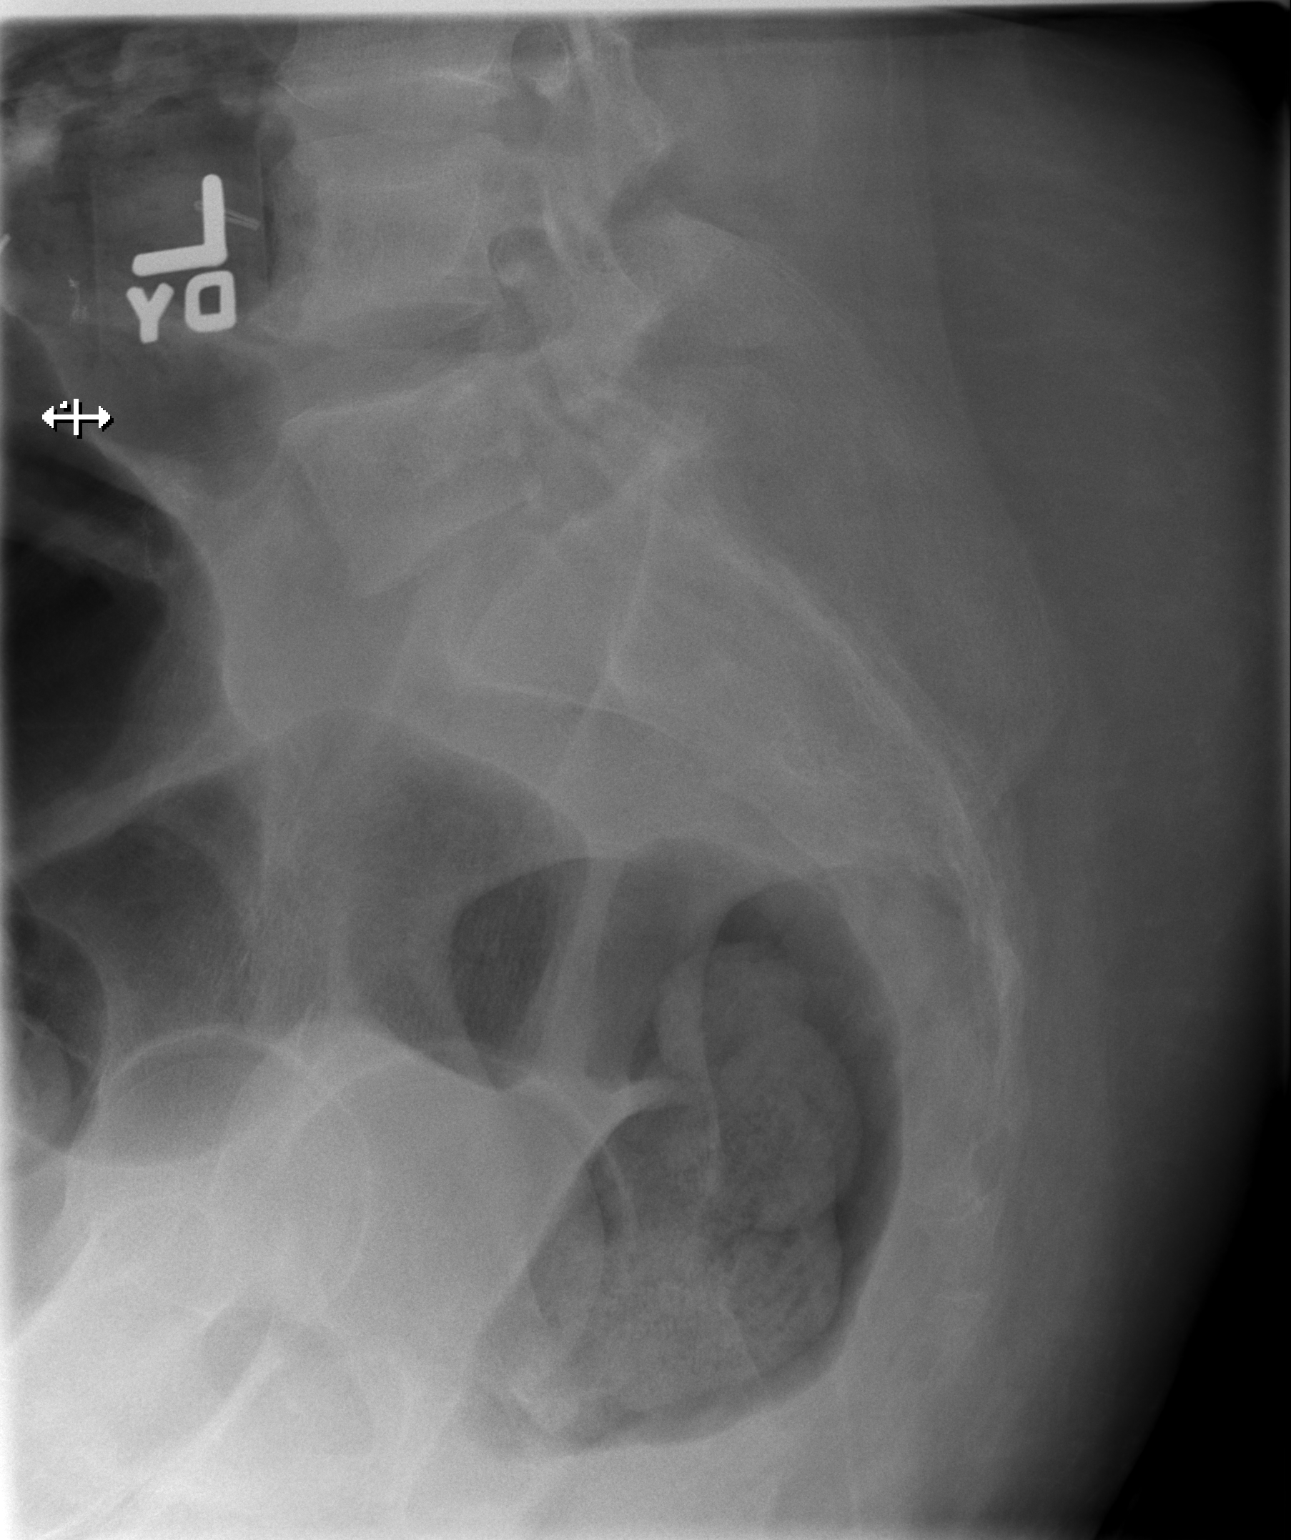

[3 of 3 positions shown; findings below may reference images not displayed]

FINDINGS: There is no evidence of fracture or other focal bone lesions.
Diffuse osteopenia. SI joints are symmetric and unremarkable.
IMPRESSION: No acute bony abnormality.

## 2016-03-30 IMAGING — CR DG KNEE COMPLETE 4+V*L*
1 series · 4 of 4 positions shown · non-contrast
Comparison: None.

CLINICAL DATA: Left knee pain after fall.

EXAM:
LEFT KNEE - COMPLETE 4+ VIEW

[Series 1: dxr knee lt comp with obliques · 0.14mm/px · 4 of 4 slices shown]
[im 1/4]
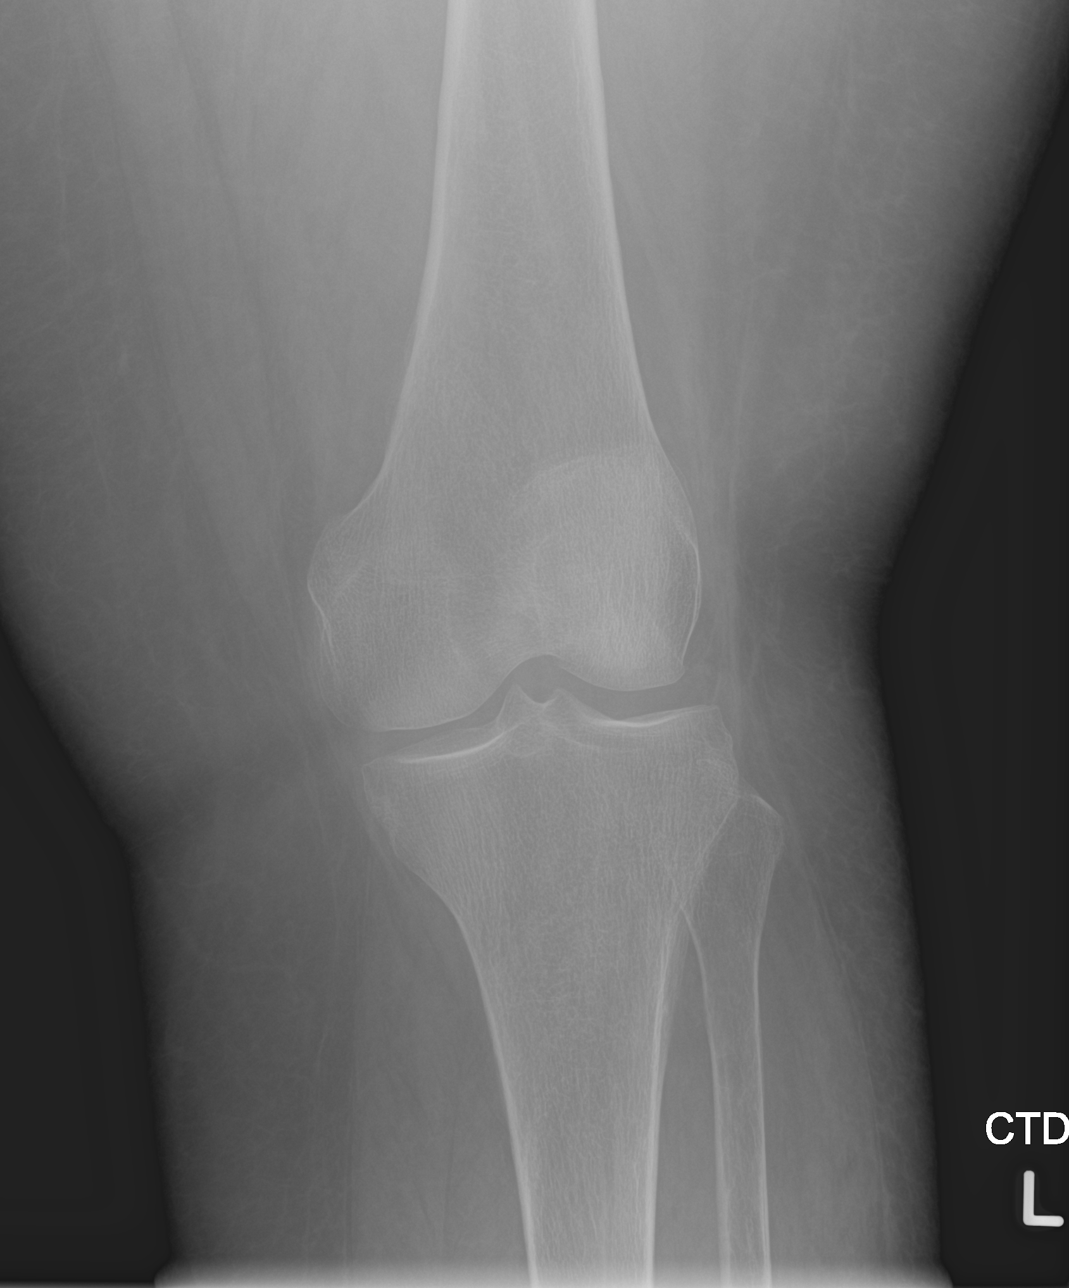
[im 2/4]
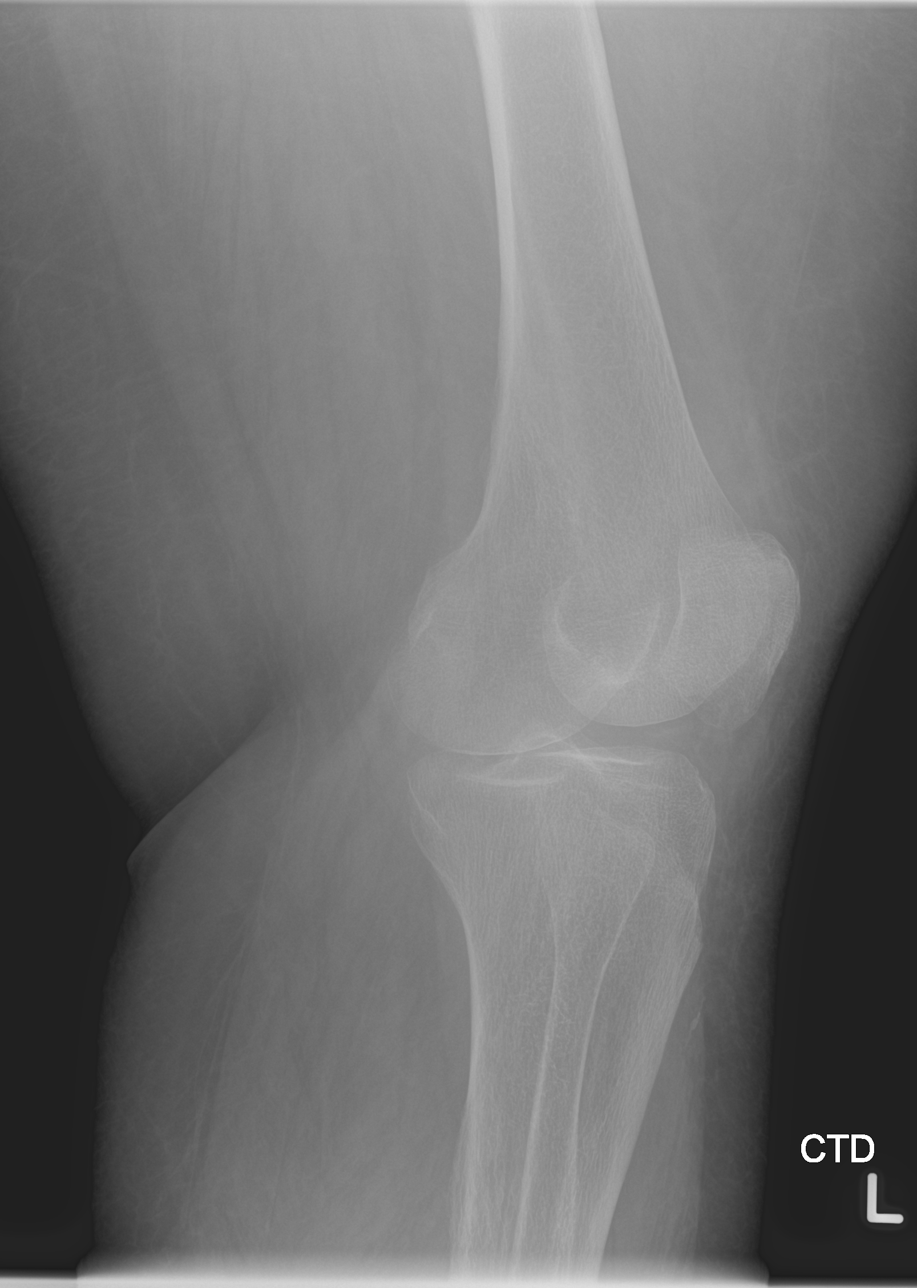
[im 3/4]
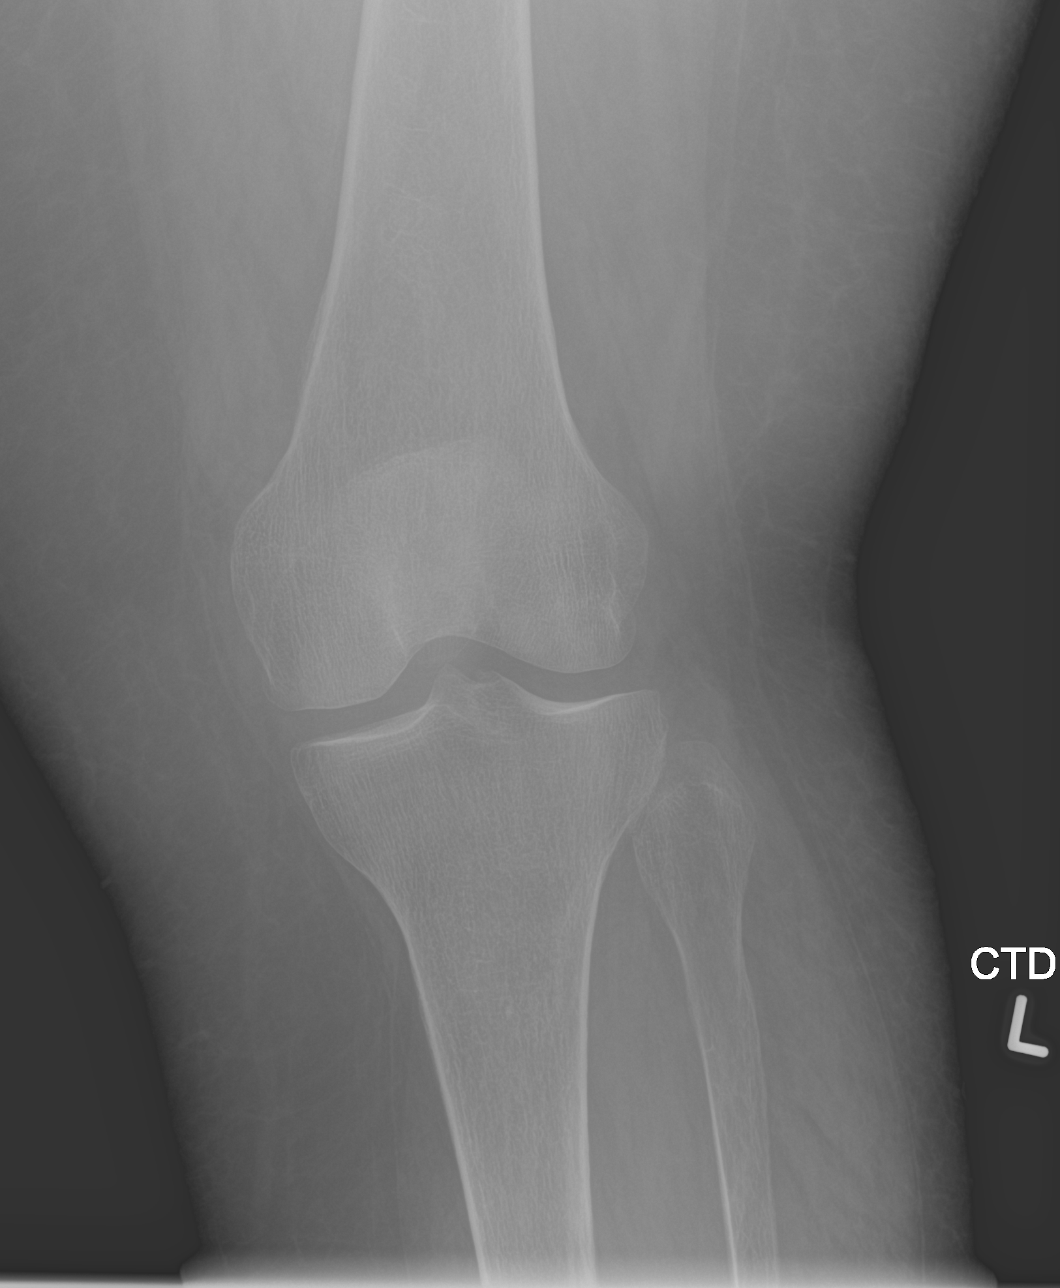
[im 4/4]
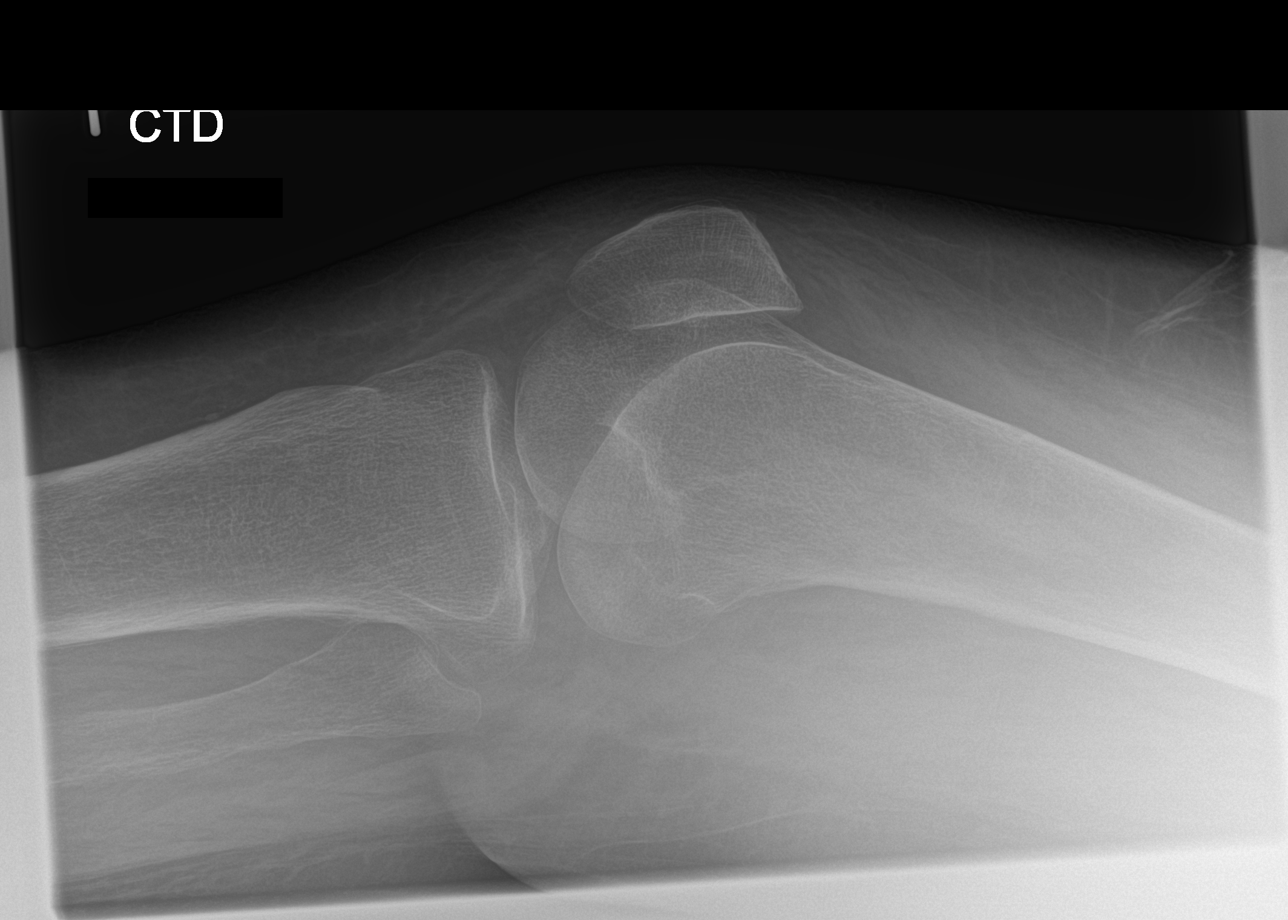

[4 of 4 positions shown; findings below may reference images not displayed]

FINDINGS: There is no evidence of fracture, dislocation, or joint effusion.
There is no evidence of arthropathy or other focal bone abnormality.
Soft tissues are unremarkable.
IMPRESSION: Negative.

## 2016-04-17 IMAGING — CT CT HEAD WITHOUT CONTRAST
1 of 2 series · 16 of 30 positions shown, 20 images · non-contrast
Comparison: 04/21/2013

CLINICAL DATA: ha weakness, pain

EXAM:
CT HEAD WITHOUT CONTRAST
TECHNIQUE: Contiguous axial images were obtained from the base of the skull
through the vertex without intravenous contrast.

[Series 2: head wo · axial · 0.47mm/px · z∈[-56,+77]mm · 16 of 32 slices shown, 20 images]
[im 2/32  brain]
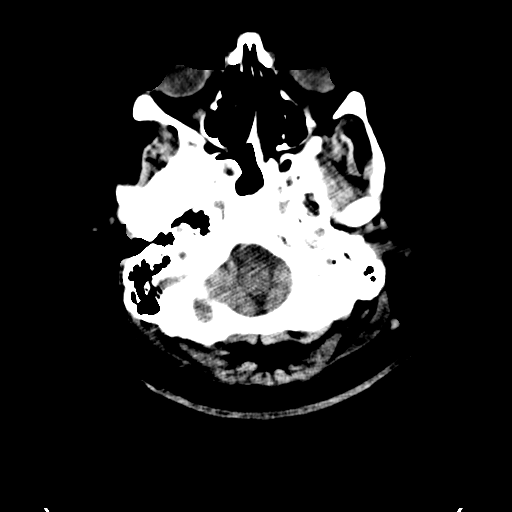
[im 2/32  bone]
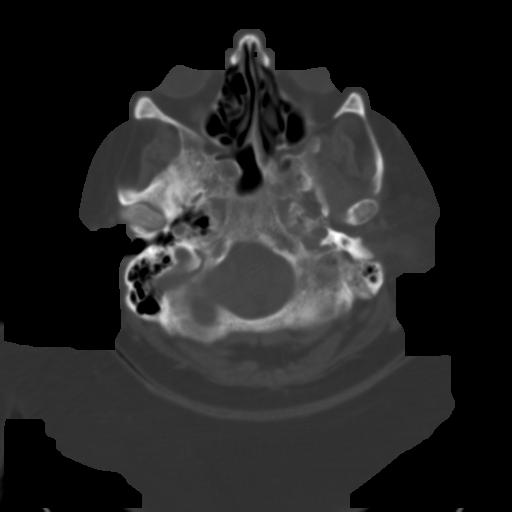
[im 5/32  brain]
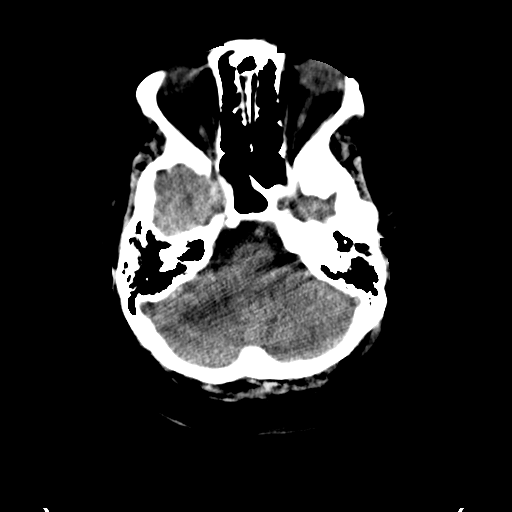
[im 6/32  brain]
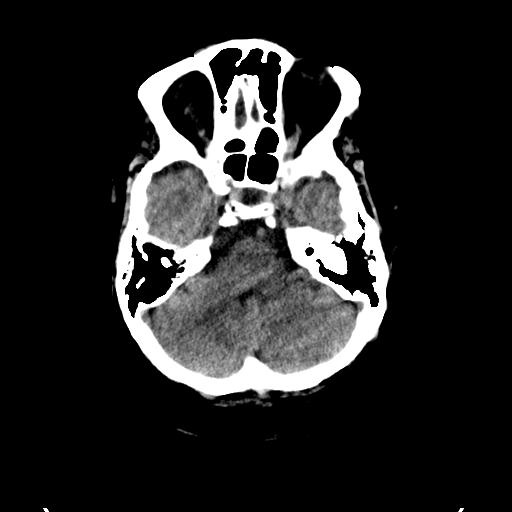
[im 7/32  brain]
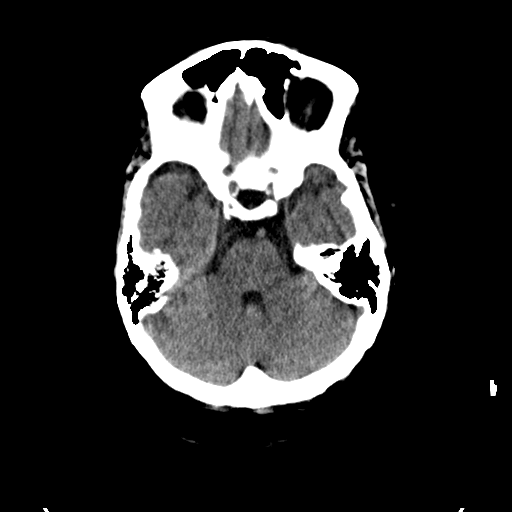
[im 10/32  brain]
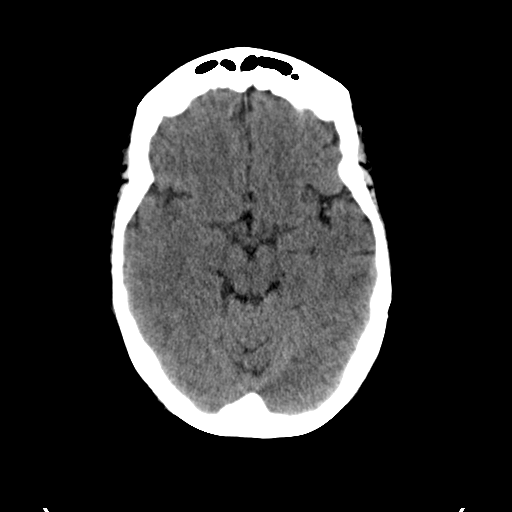
[im 10/32  bone]
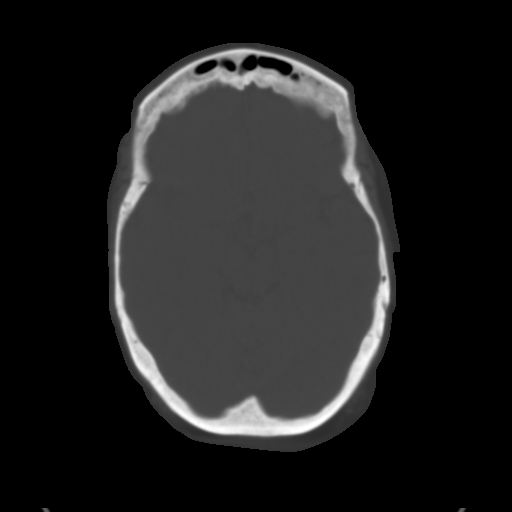
[im 11/32  brain]
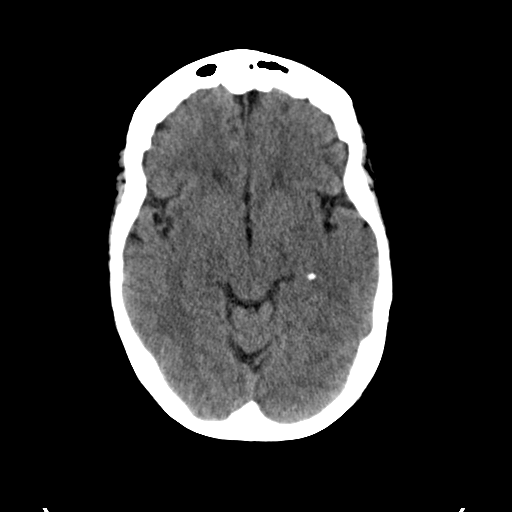
[im 13/32  brain]
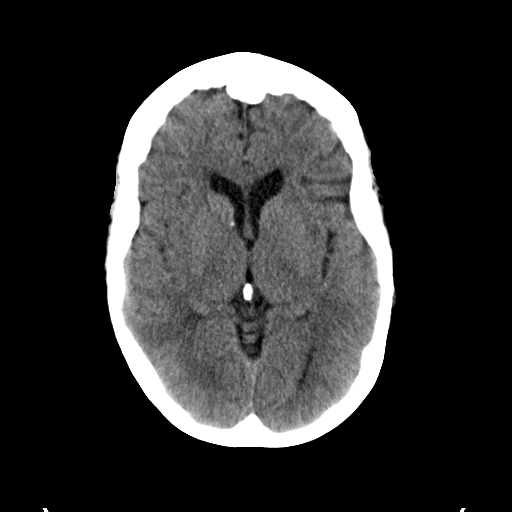
[im 15/32  brain]
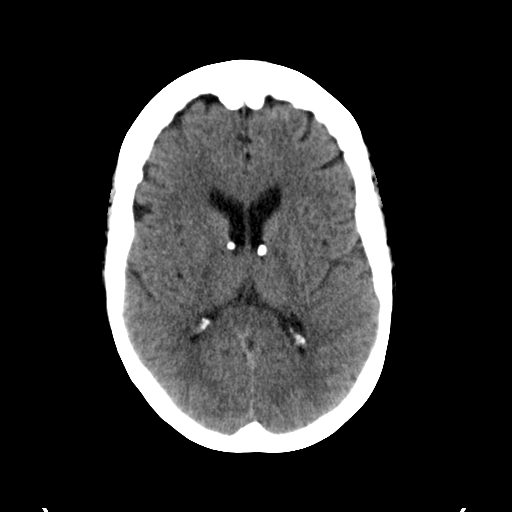
[im 17/32  brain]
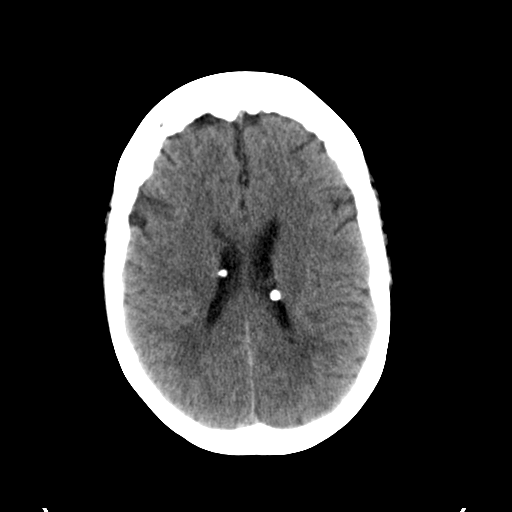
[im 17/32  bone]
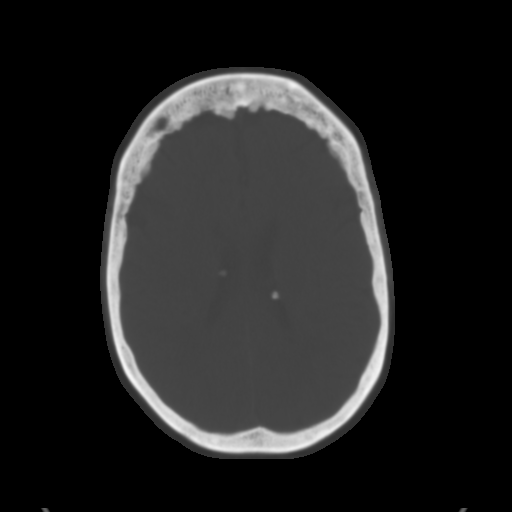
[im 19/32  brain]
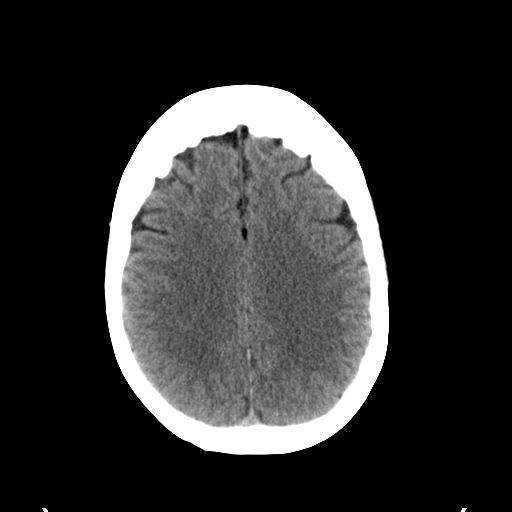
[im 21/32  brain]
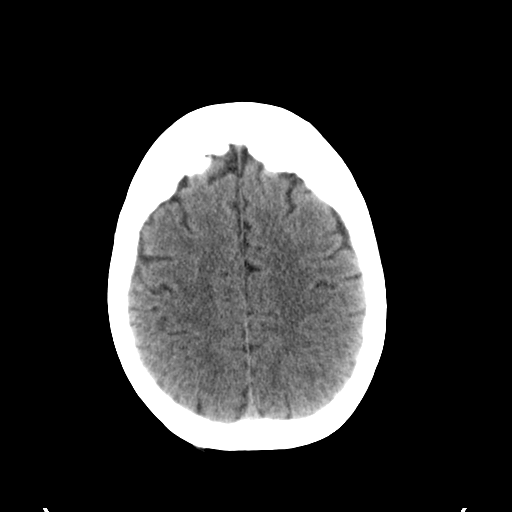
[im 22/32  brain]
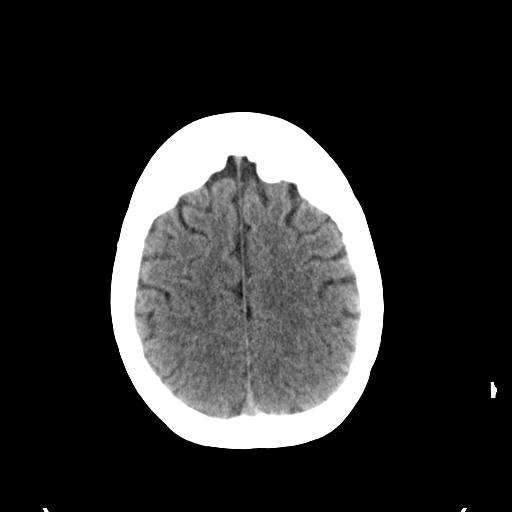
[im 25/32  brain]
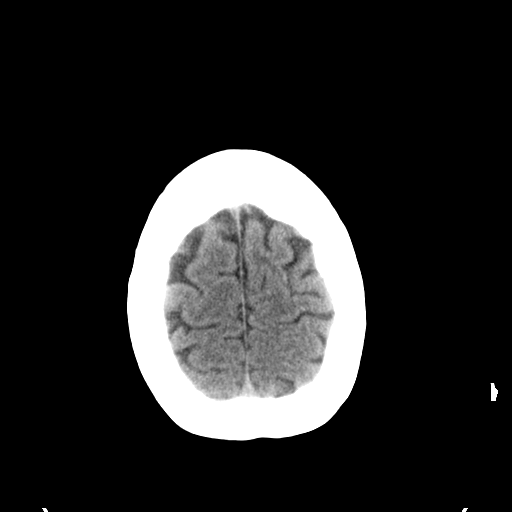
[im 25/32  bone]
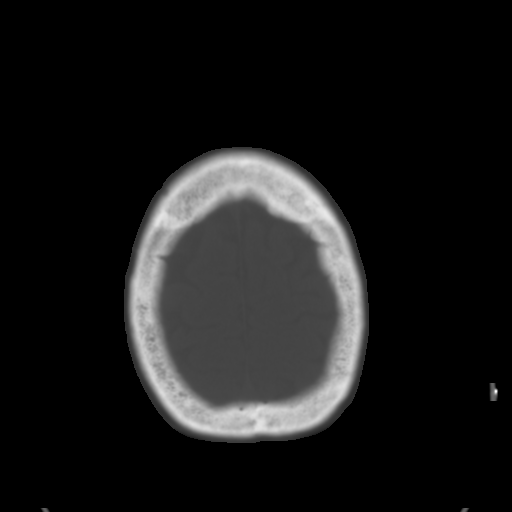
[im 26/32  brain]
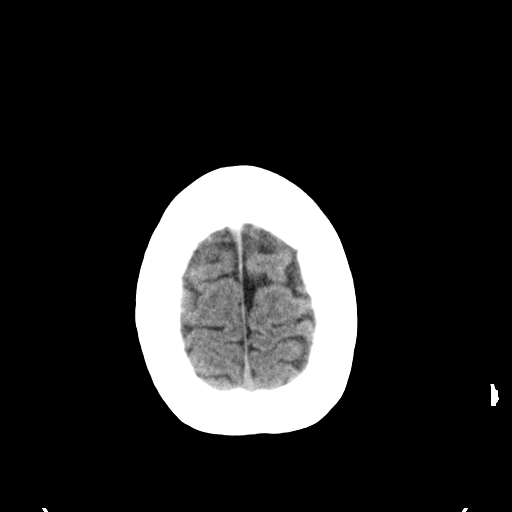
[im 27/32  brain]
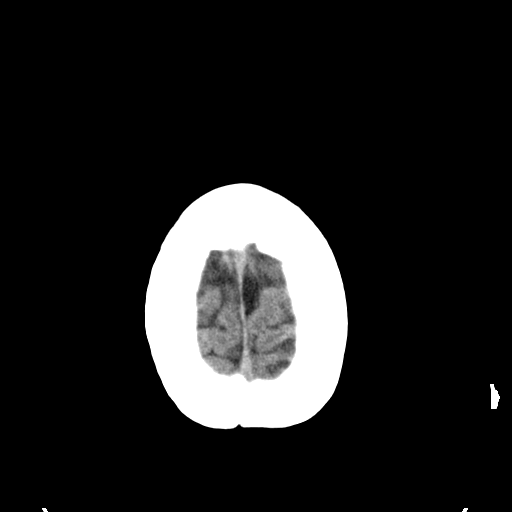
[im 30/32  brain]
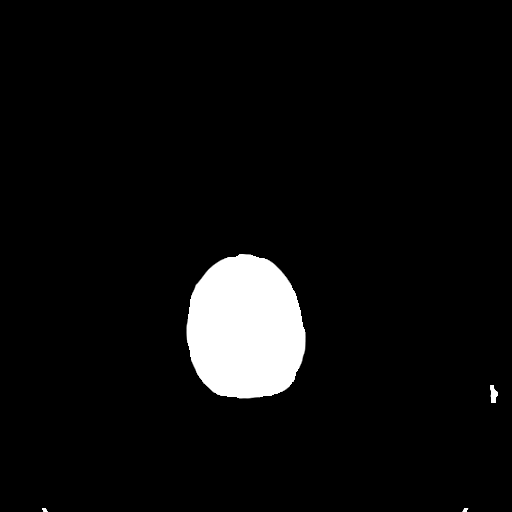

[16 of 30 positions shown; findings below may reference images not displayed]

FINDINGS: Atherosclerotic and physiologic intracranial calcifications. Coarse
calcifications at within both lateral ventricles as before. There is
no evidence of acute intracranial hemorrhage, brain edema, mass
lesion, acute infarction, mass effect, or midline shift. Acute
infarct may be inapparent on noncontrast CT. No other intra-axial
abnormalities are seen, and the ventricles and sulci are within
normal limits in size and symmetry. No abnormal extra-axial fluid
collections or masses are identified. No significant calvarial
abnormality.
IMPRESSION: 1. Negative for bleed or other acute intracranial process.

## 2016-04-17 IMAGING — CR DG CHEST 2V
1 series · 2 of 2 positions shown · non-contrast
Comparison: 03/13/2014

CLINICAL DATA: cp, body aches, weakness, on hemodialysis

EXAM:
CHEST - 2 VIEW

[Series 1: dxr chest pa (or ap) and lateral · 0.14mm/px · 2 of 2 slices shown]
[im 1/2]
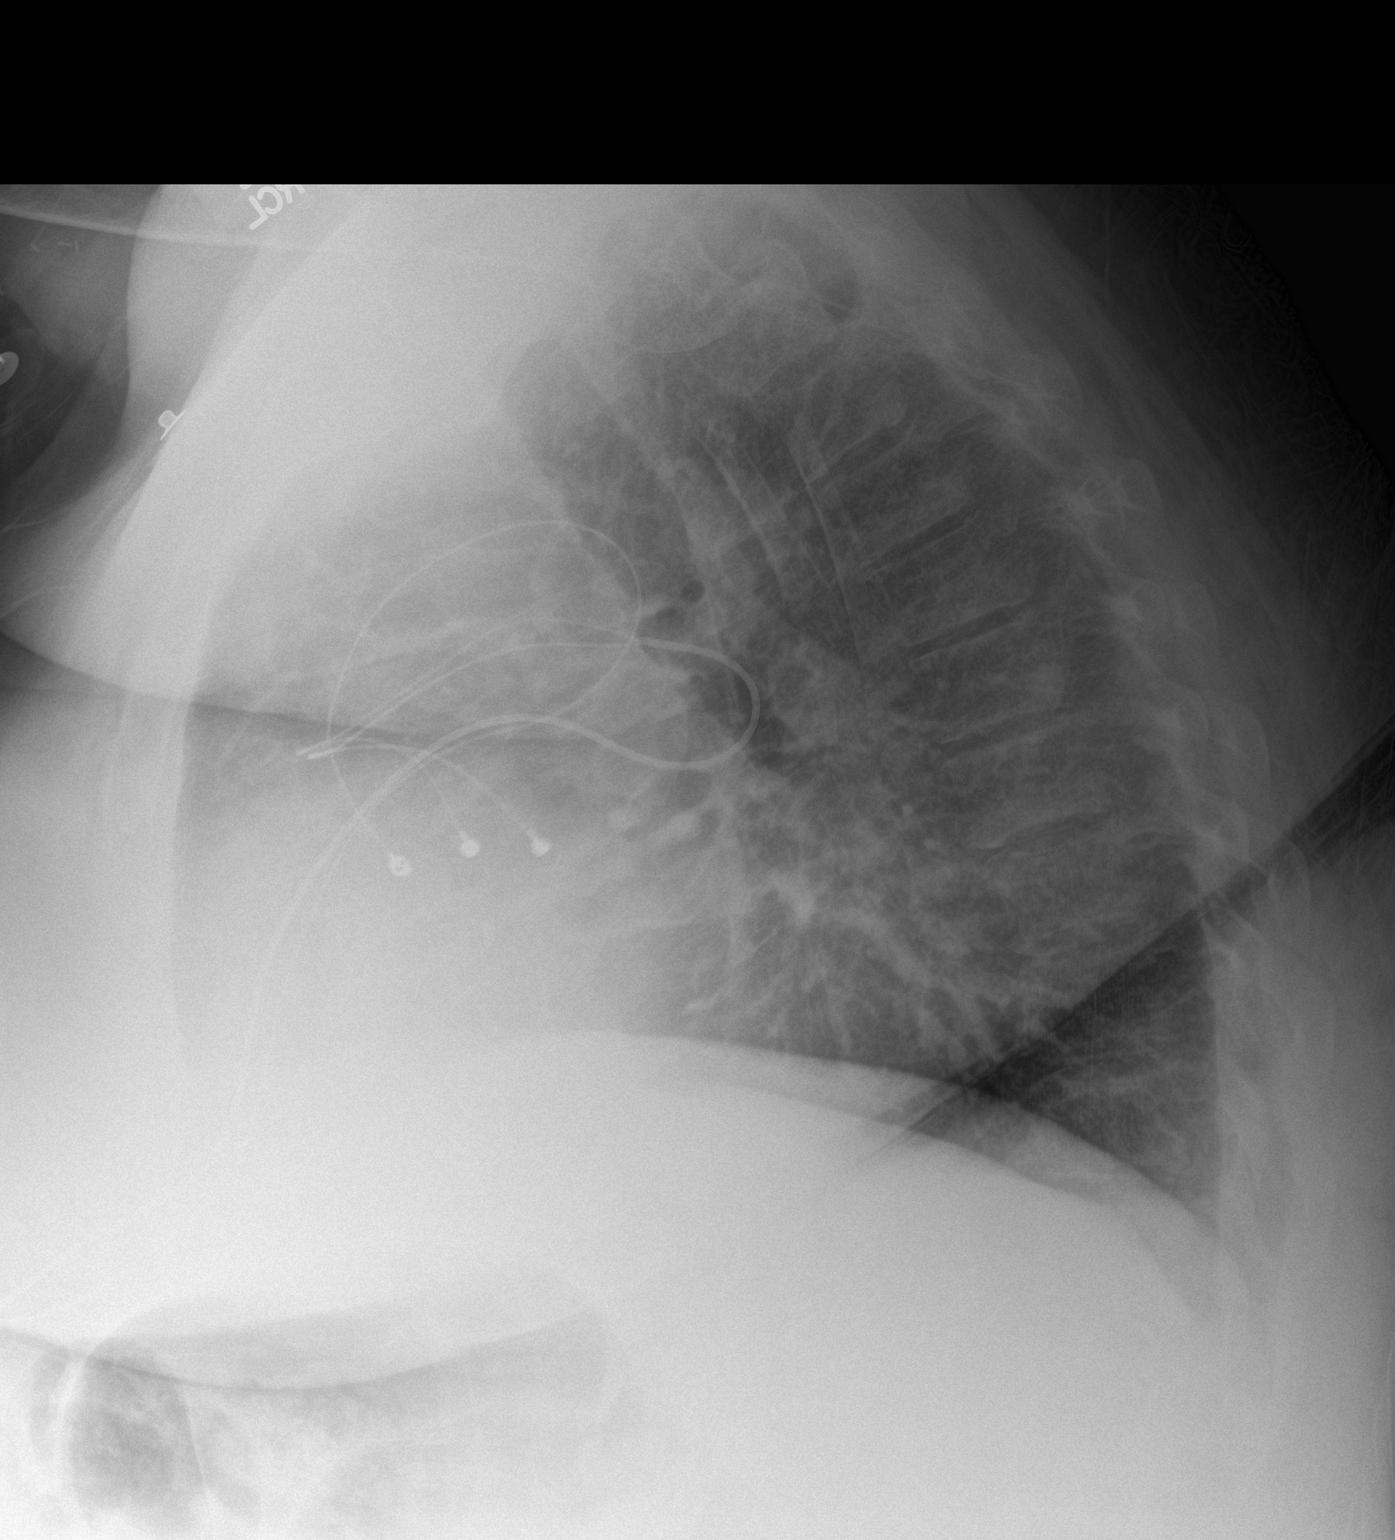
[im 2/2]
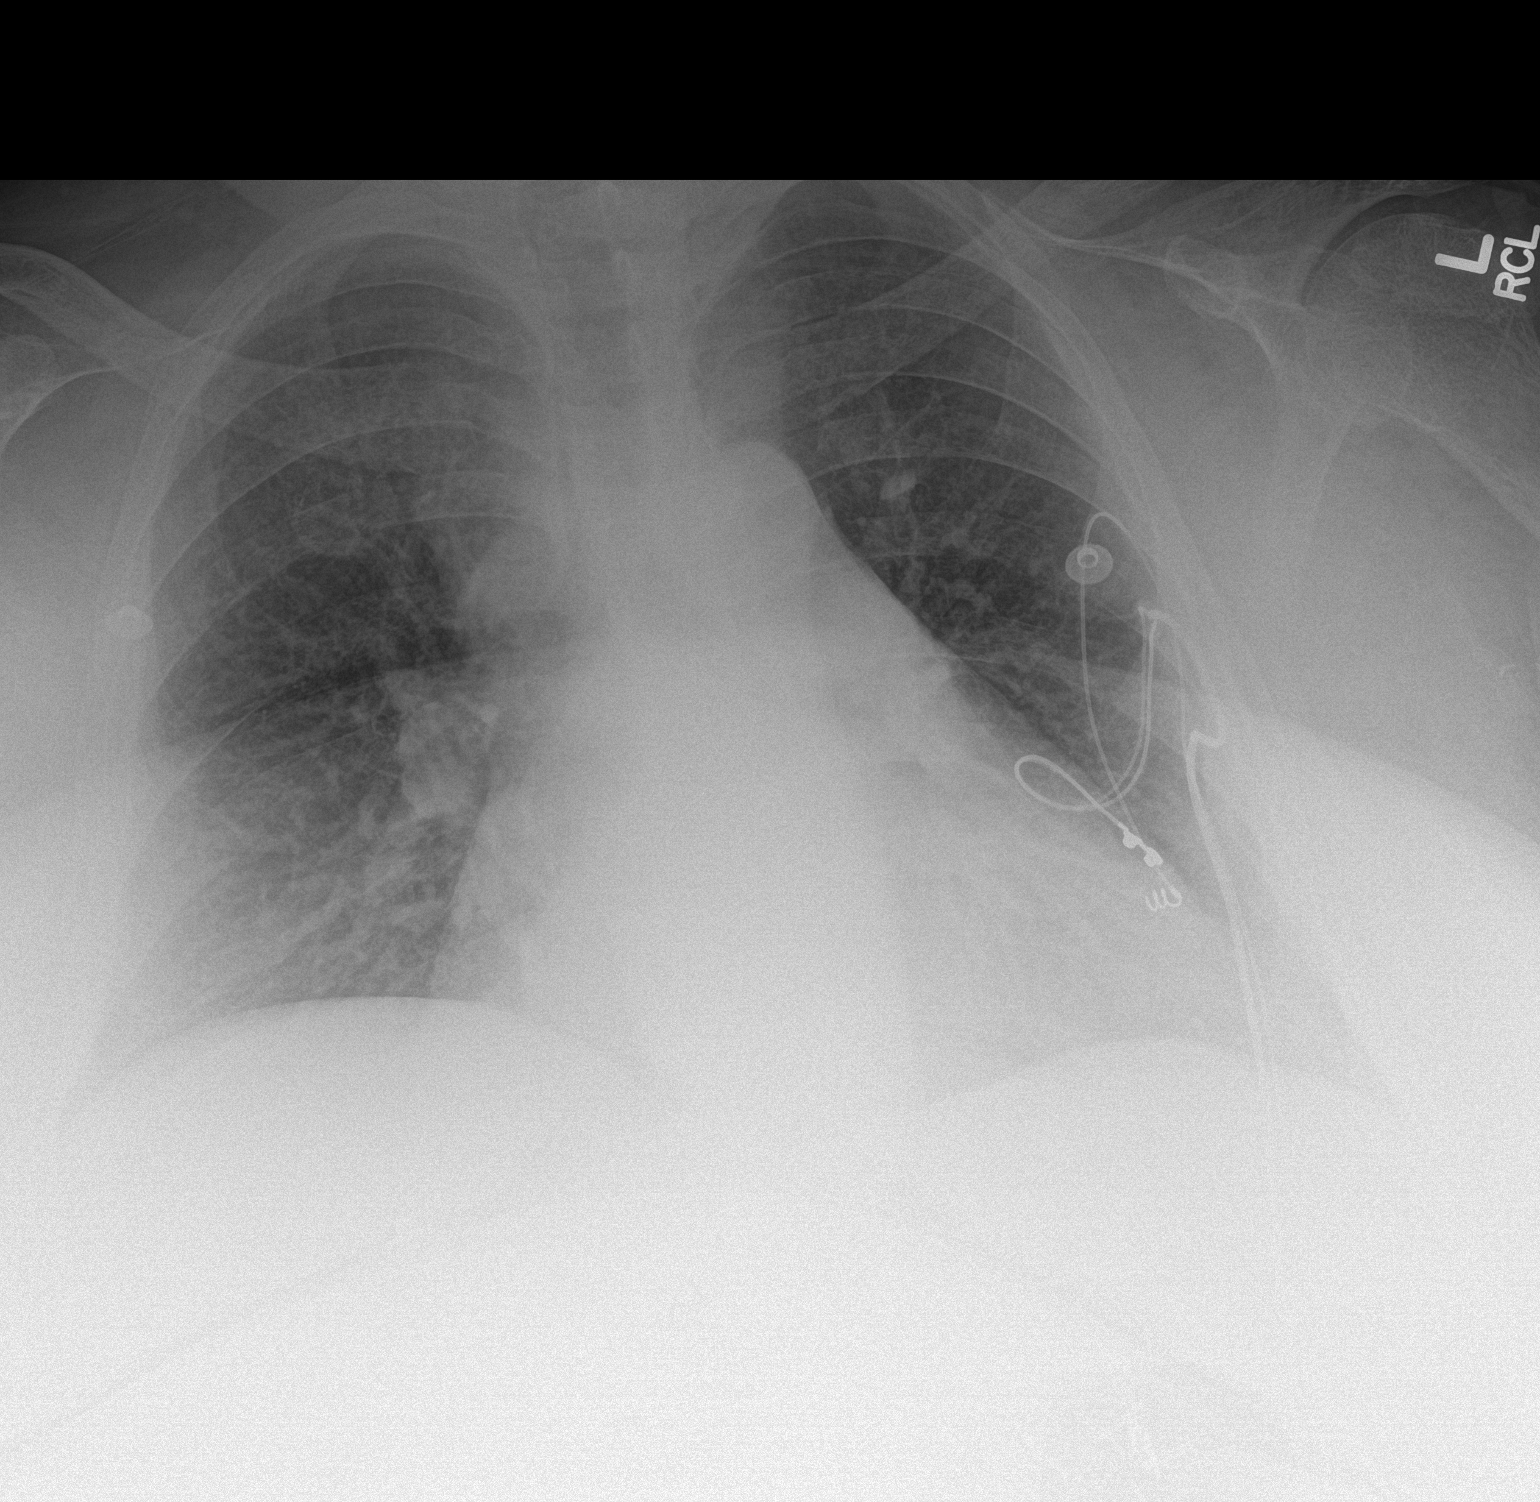

[2 of 2 positions shown; findings below may reference images not displayed]

FINDINGS: Stable moderate cardiomegaly. Stable epicardial pacing leads. Mild
interstitial edema or infiltrates slightly increased.
No effusion.
Visualized skeletal structures are unremarkable.
IMPRESSION: 1. Stable cardiomegaly with mild bilateral interstitial
edema/infiltrates.

## 2016-05-22 IMAGING — CT CT HEAD WITHOUT CONTRAST
1 of 2 series · 15 of 30 positions shown, 19 images · non-contrast
Comparison: Most recent prior CT scan of the head 04/17/2014

CLINICAL DATA: 58-year-old female with progressive headache
beginning during dialysis earlier today.

EXAM:
CT HEAD WITHOUT CONTRAST
TECHNIQUE: Contiguous axial images were obtained from the base of the skull
through the vertex without intravenous contrast.

[Series 2: head wo · axial · 0.42mm/px · z∈[-10,+118]mm · 15 of 30 slices shown, 19 images]
[im 2/30  brain]
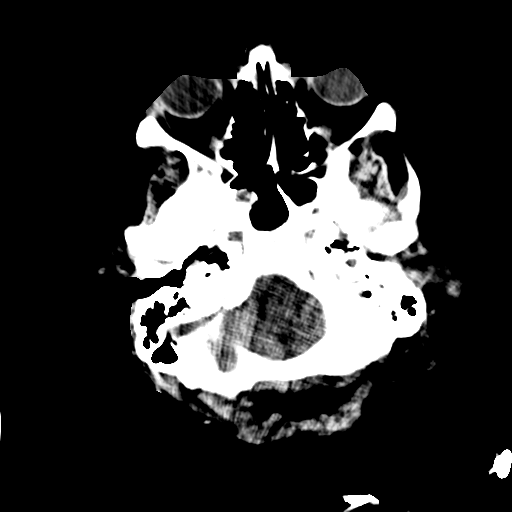
[im 2/30  bone]
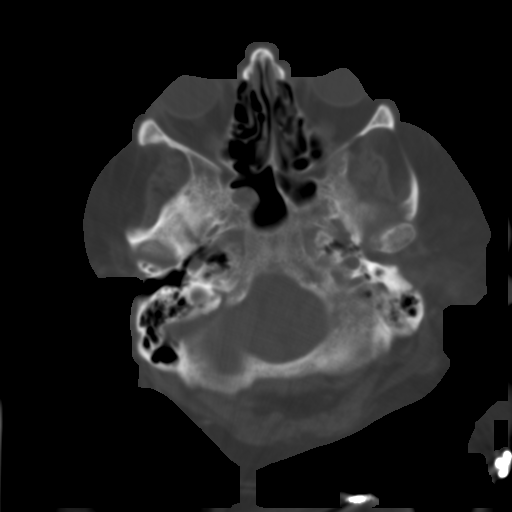
[im 3/30  brain]
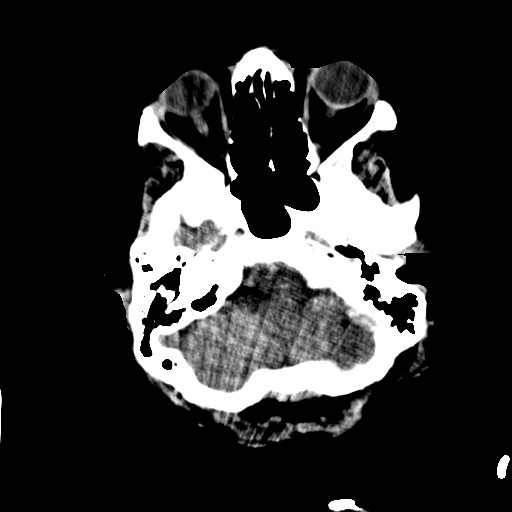
[im 6/30  brain]
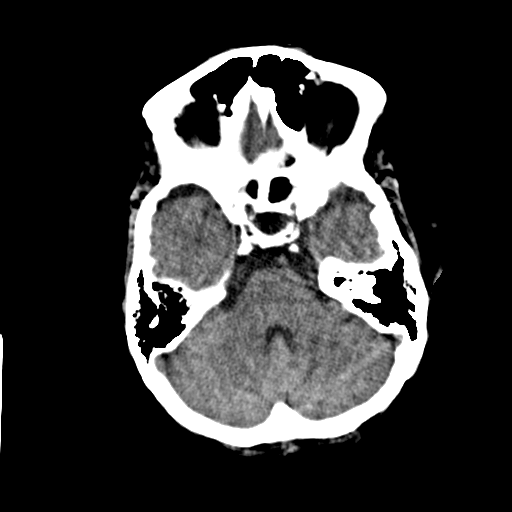
[im 8/30  brain]
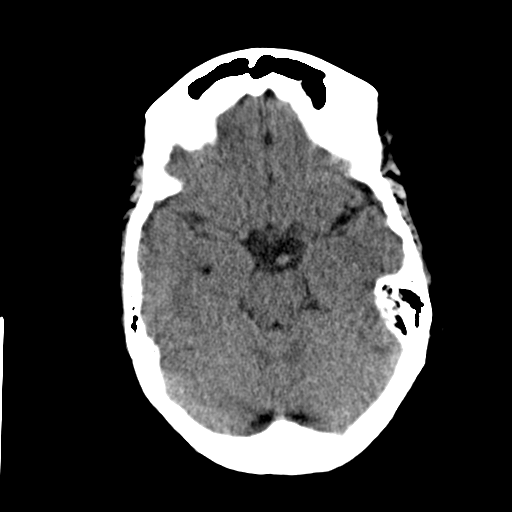
[im 9/30  brain]
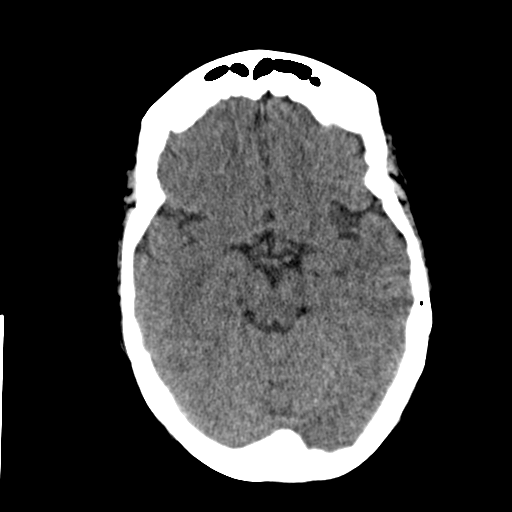
[im 9/30  bone]
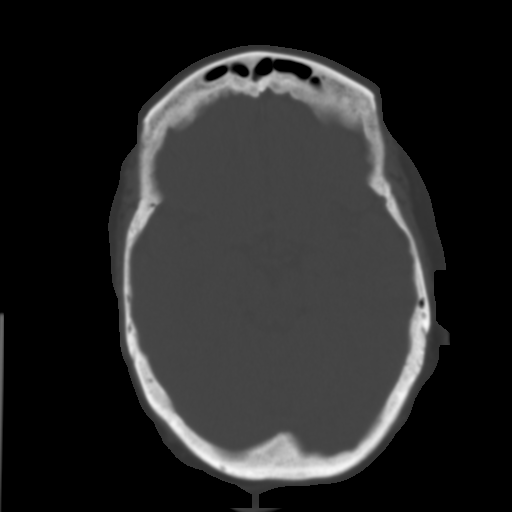
[im 11/30  brain]
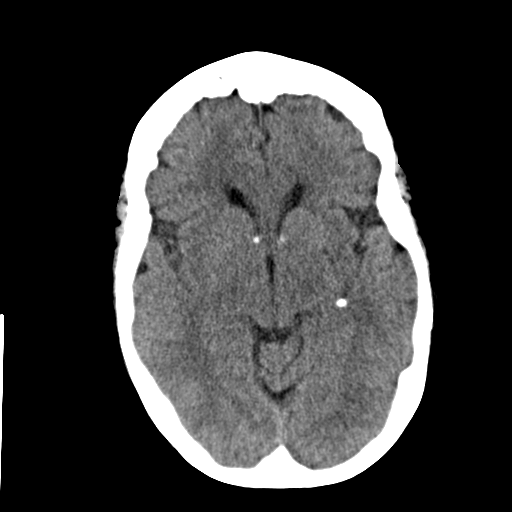
[im 14/30  brain]
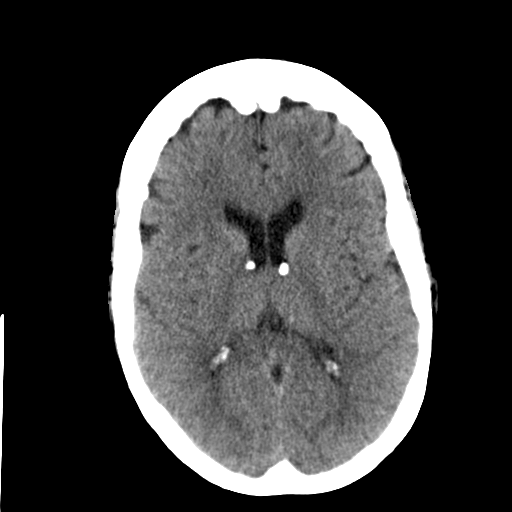
[im 15/30  brain]
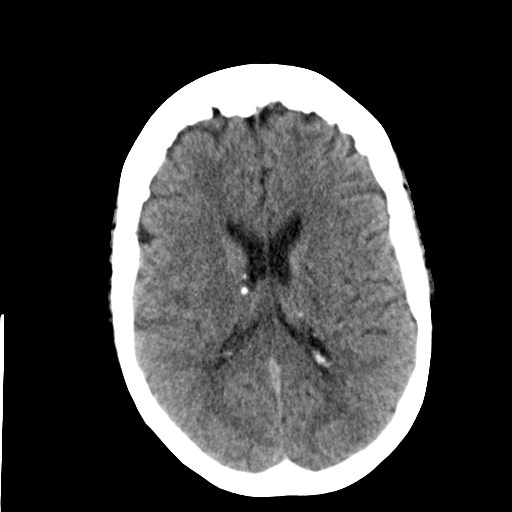
[im 16/30  brain]
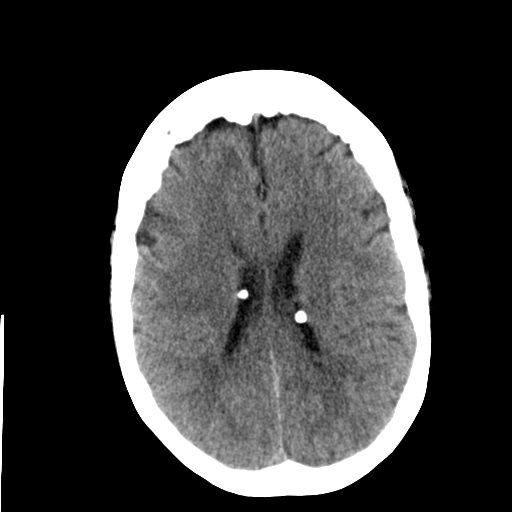
[im 16/30  bone]
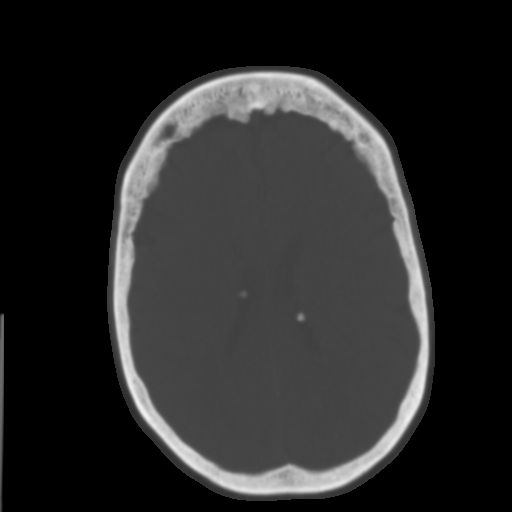
[im 19/30  brain]
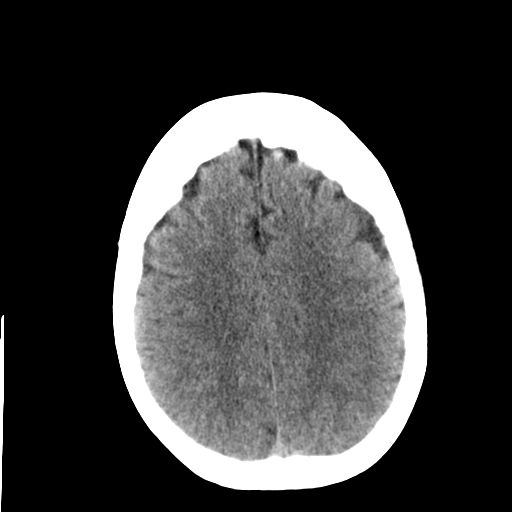
[im 21/30  brain]
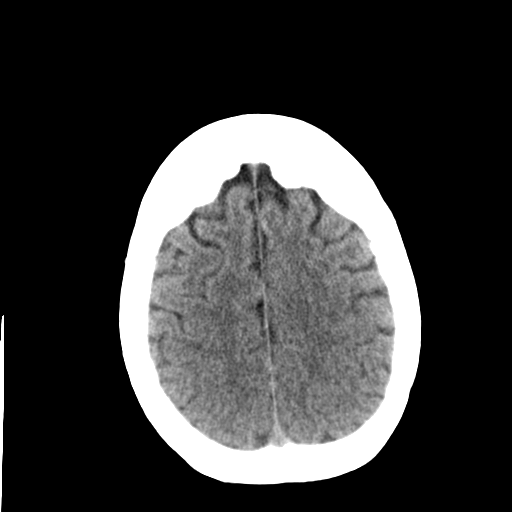
[im 22/30  brain]
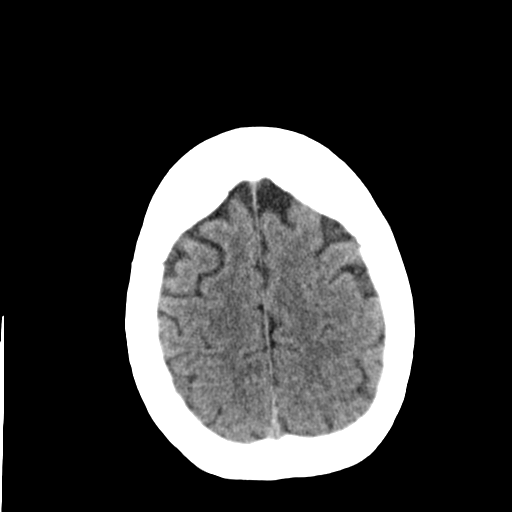
[im 24/30  brain]
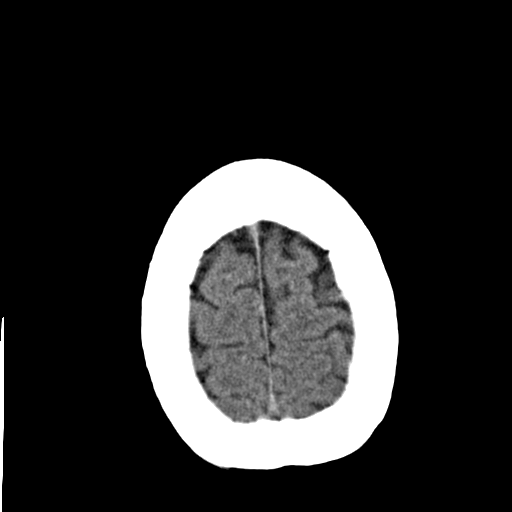
[im 24/30  bone]
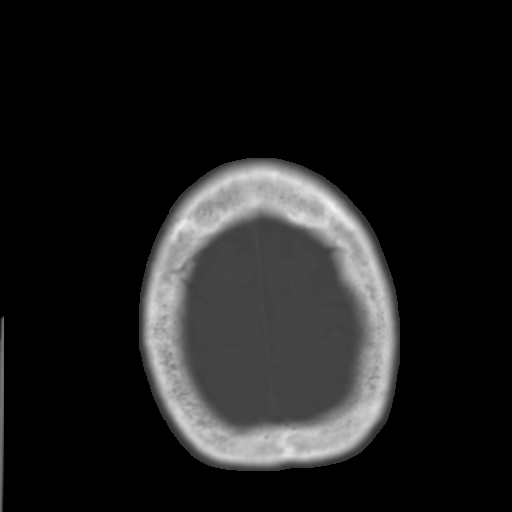
[im 27/30  brain]
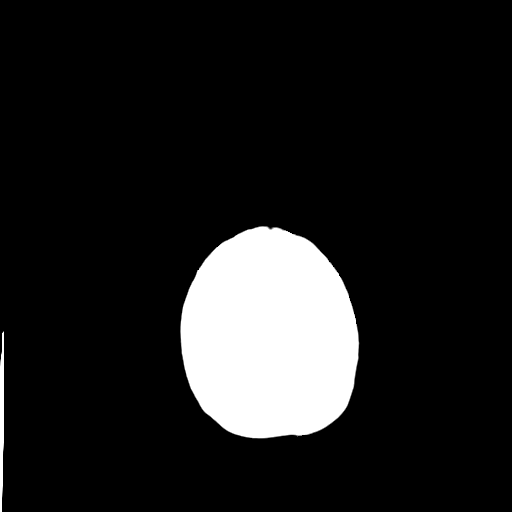
[im 28/30  brain]
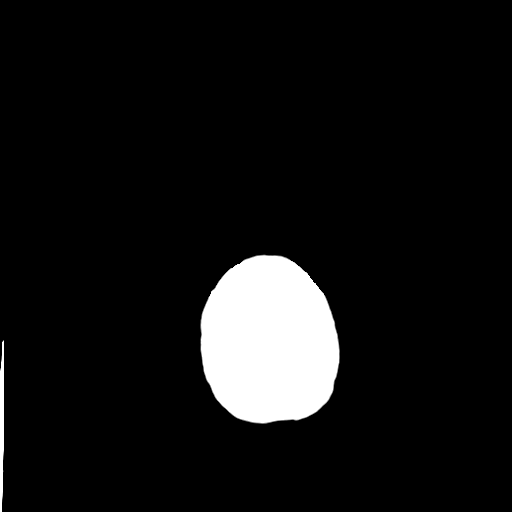

[15 of 30 positions shown; findings below may reference images not displayed]

FINDINGS: Negative for acute intracranial hemorrhage, acute infarction, mass,
mass effect, hydrocephalus or midline shift. Gray-white
differentiation is preserved throughout. Stable
subependymal/periventricular calcifications lining the bilateral
lateral ventricles over multiple prior studies dating back to 1692.
Hyperostosis frontalis interna. Normal aeration of the mastoid air
cells and paranasal sinuses. Probable hemangioma in the left
temporal bone which remains unchanged dating back to 1692.
IMPRESSION: 1. No acute intracranial abnormality.
2. No significant interval change in the appearance of the brain
compared including periventricular subependymal calcifications
dating back to 1692.

## 2016-06-29 IMAGING — CR DG KNEE 1-2V*R*
1 series · 2 of 2 positions shown · non-contrast
Comparison: 03/30/2014

CLINICAL DATA: Fall today, bilateral knee pain

EXAM:
RIGHT KNEE - 1-2 VIEW

[Series 1: ap · 0.17mm/px · 2 of 2 slices shown]
[im 1/2]
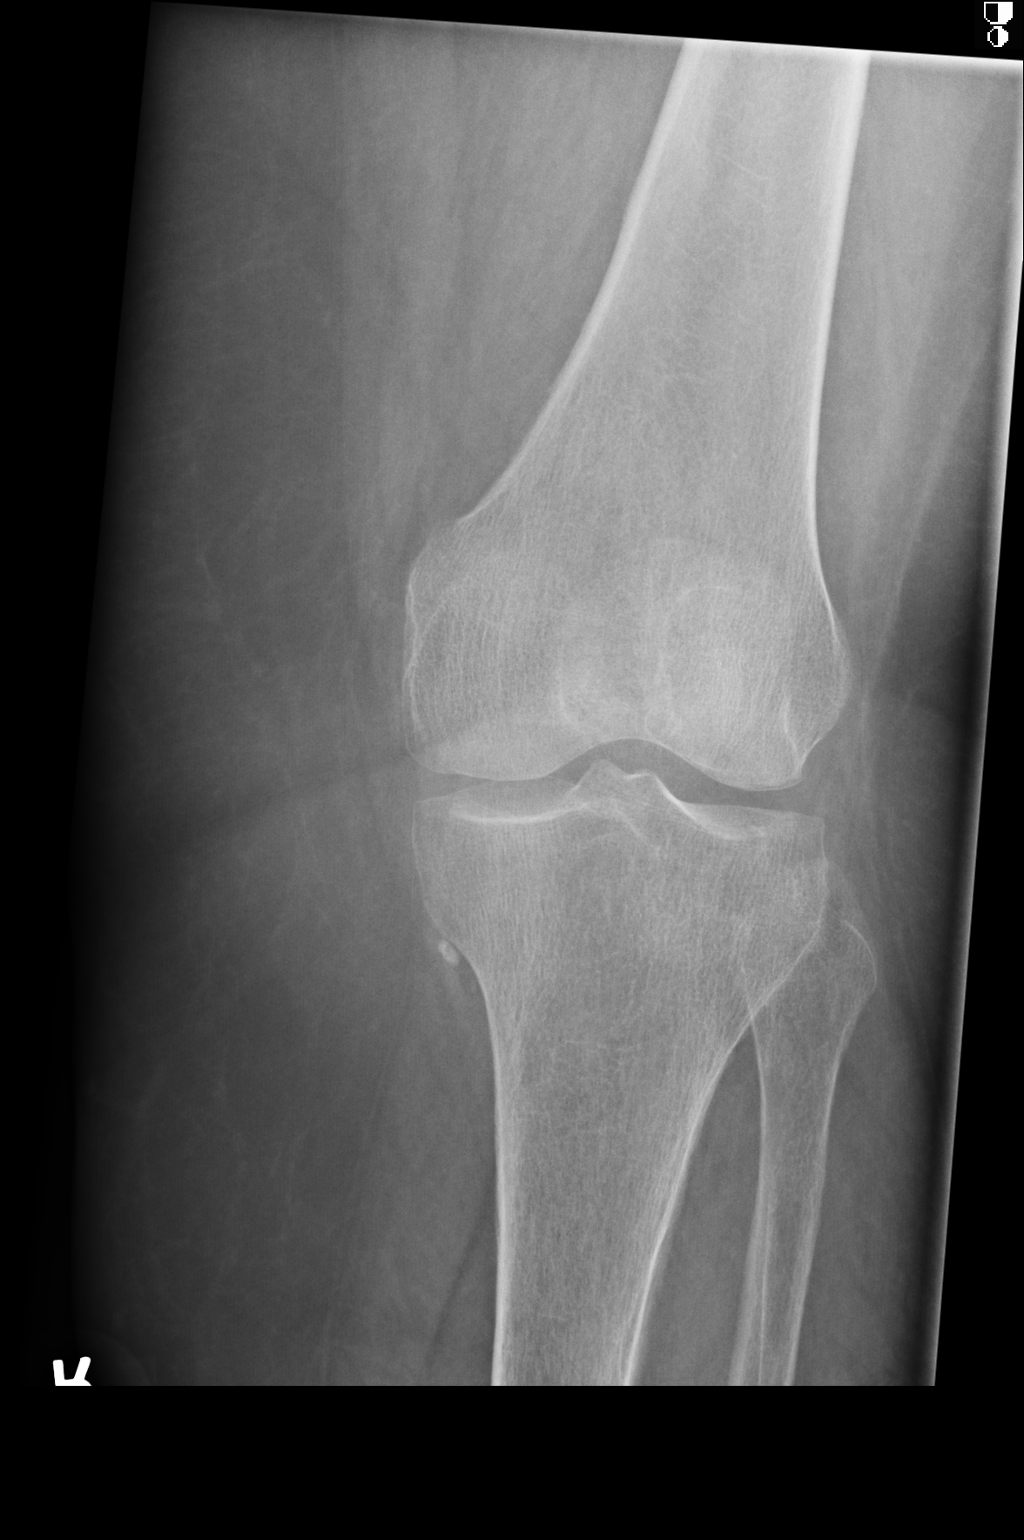
[im 2/2]
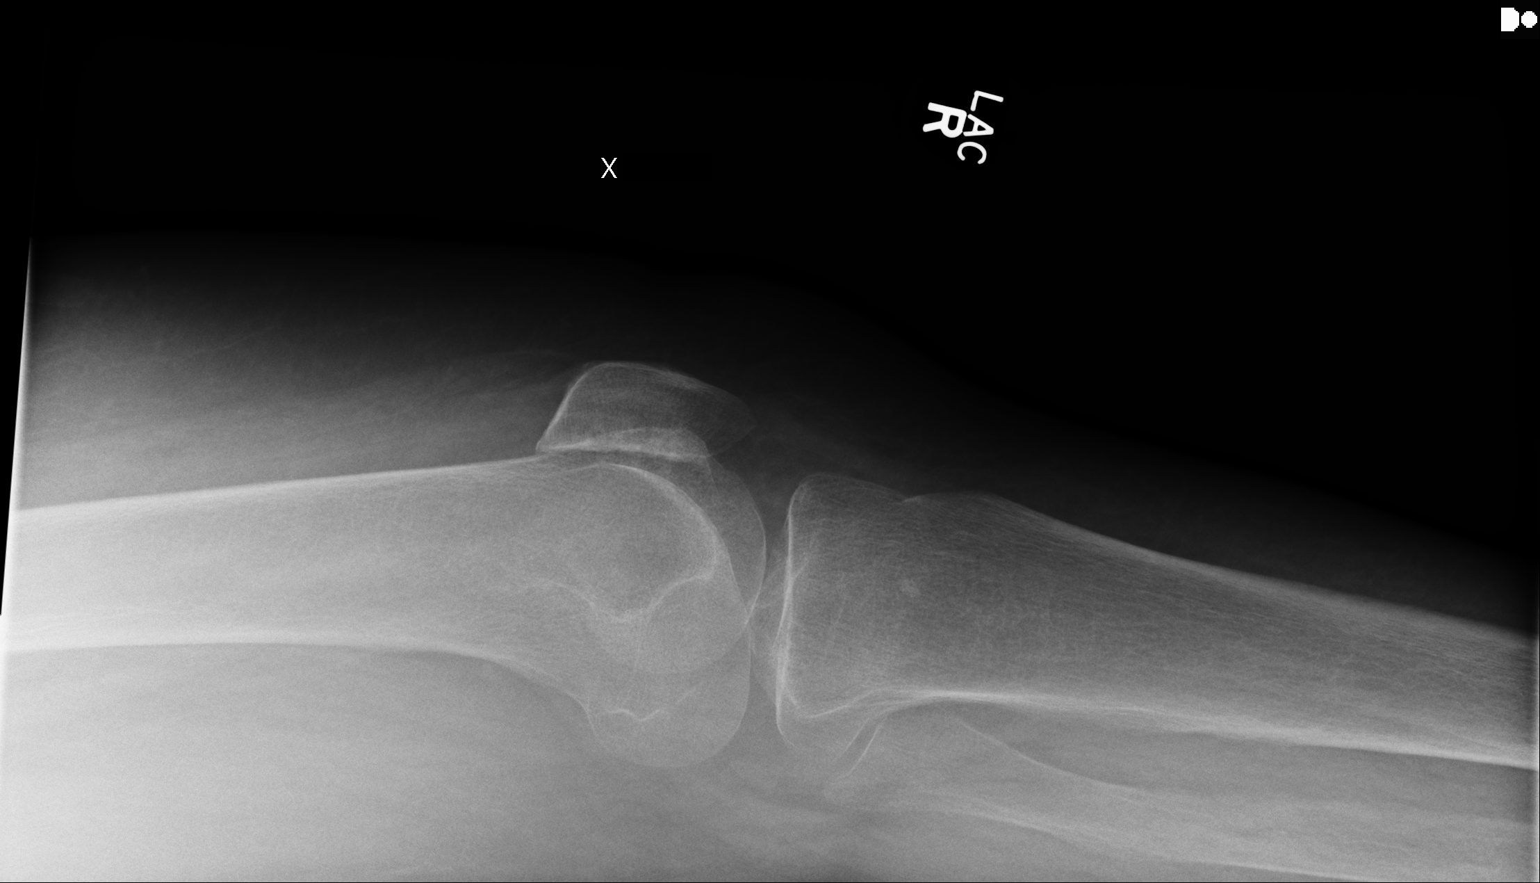

[2 of 2 positions shown; findings below may reference images not displayed]

FINDINGS: Two views of the right knee submitted. No acute fracture or
subluxation. Mild narrowing of medial joint compartment. No joint
effusion. Diffuse osteopenia.
IMPRESSION: No acute fracture or subluxation. Mild narrowing of medial joint
compartment

## 2016-06-29 IMAGING — CR DG KNEE 1-2V*L*
1 series · 2 of 2 positions shown · non-contrast
Comparison: 03/30/2014

CLINICAL DATA: Fall today, bilateral knee pain

EXAM:
LEFT KNEE - 1-2 VIEW

[Series 1: ap · 0.17mm/px · 2 of 2 slices shown]
[im 1/2]
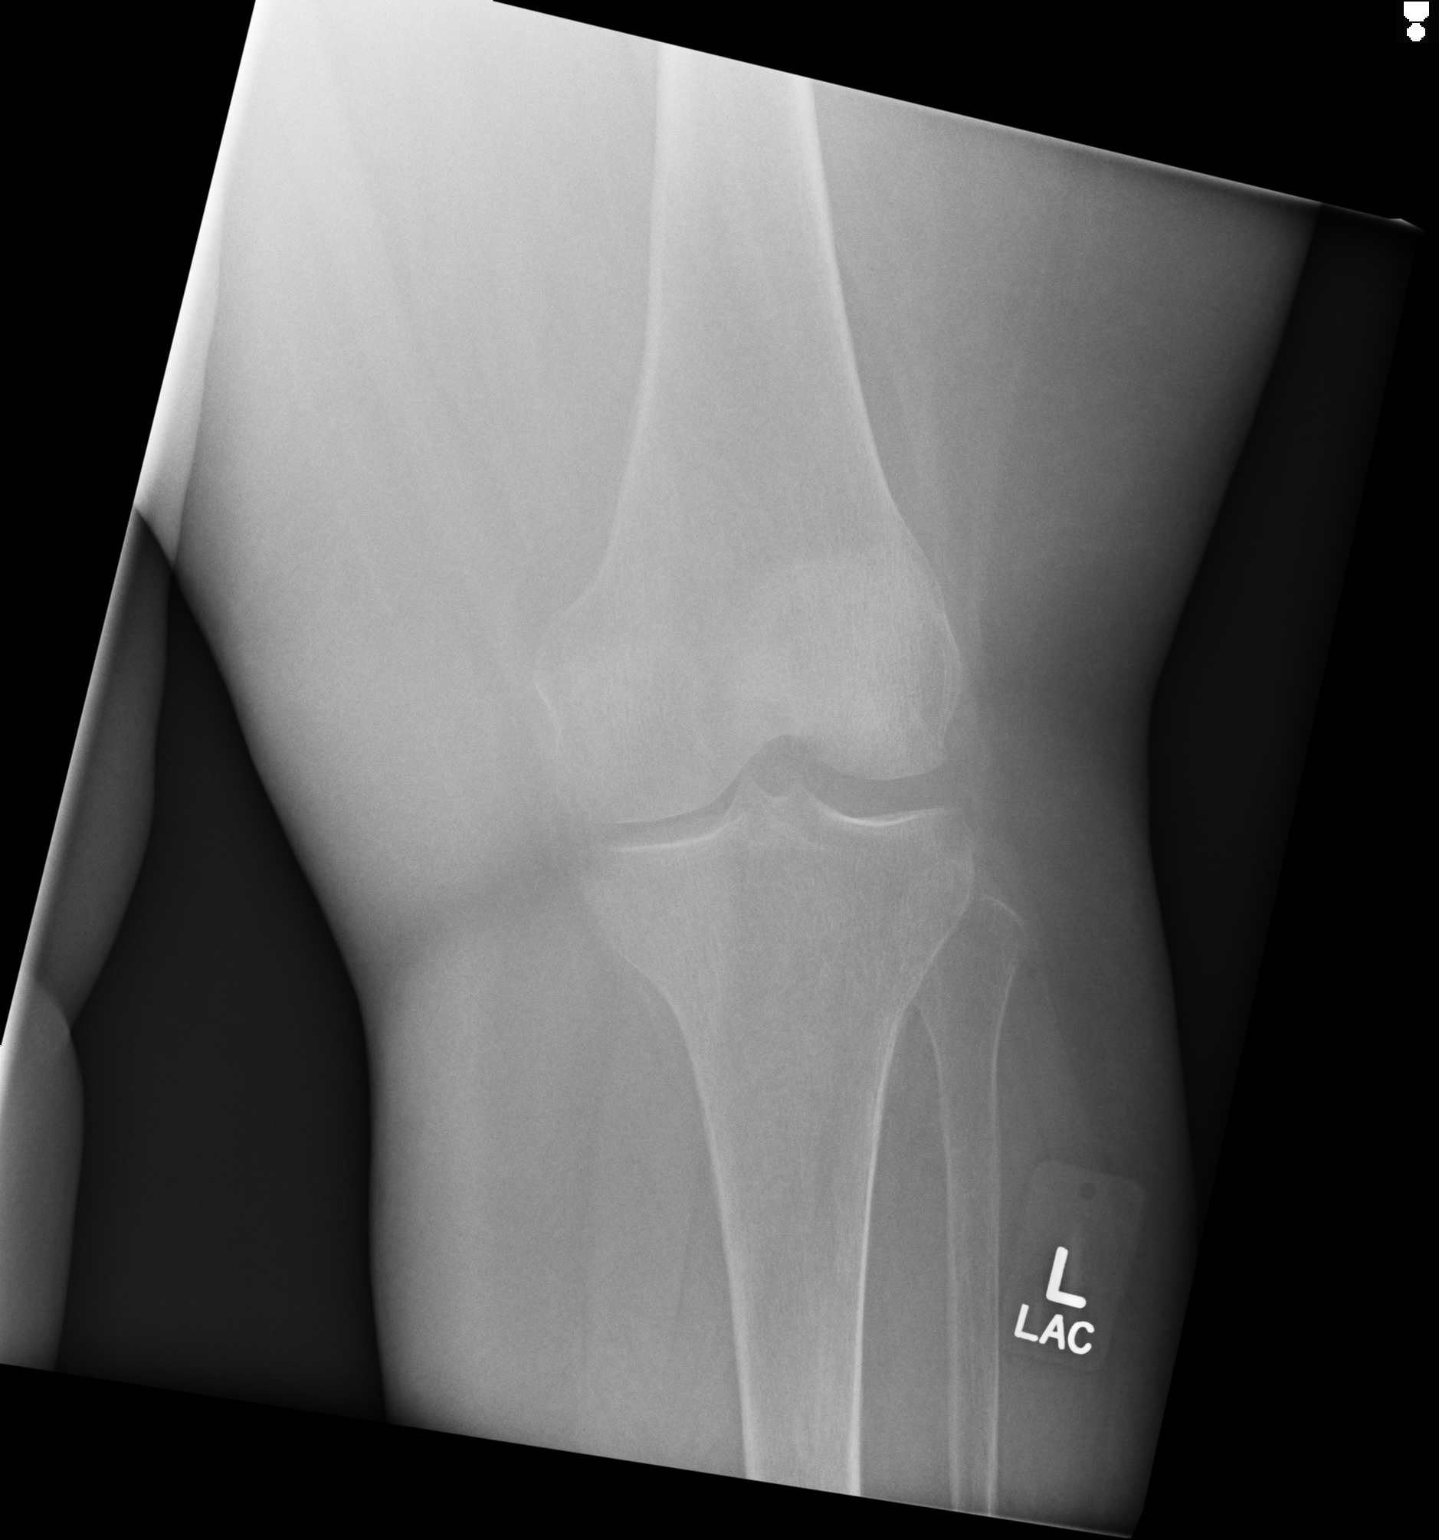
[im 2/2]
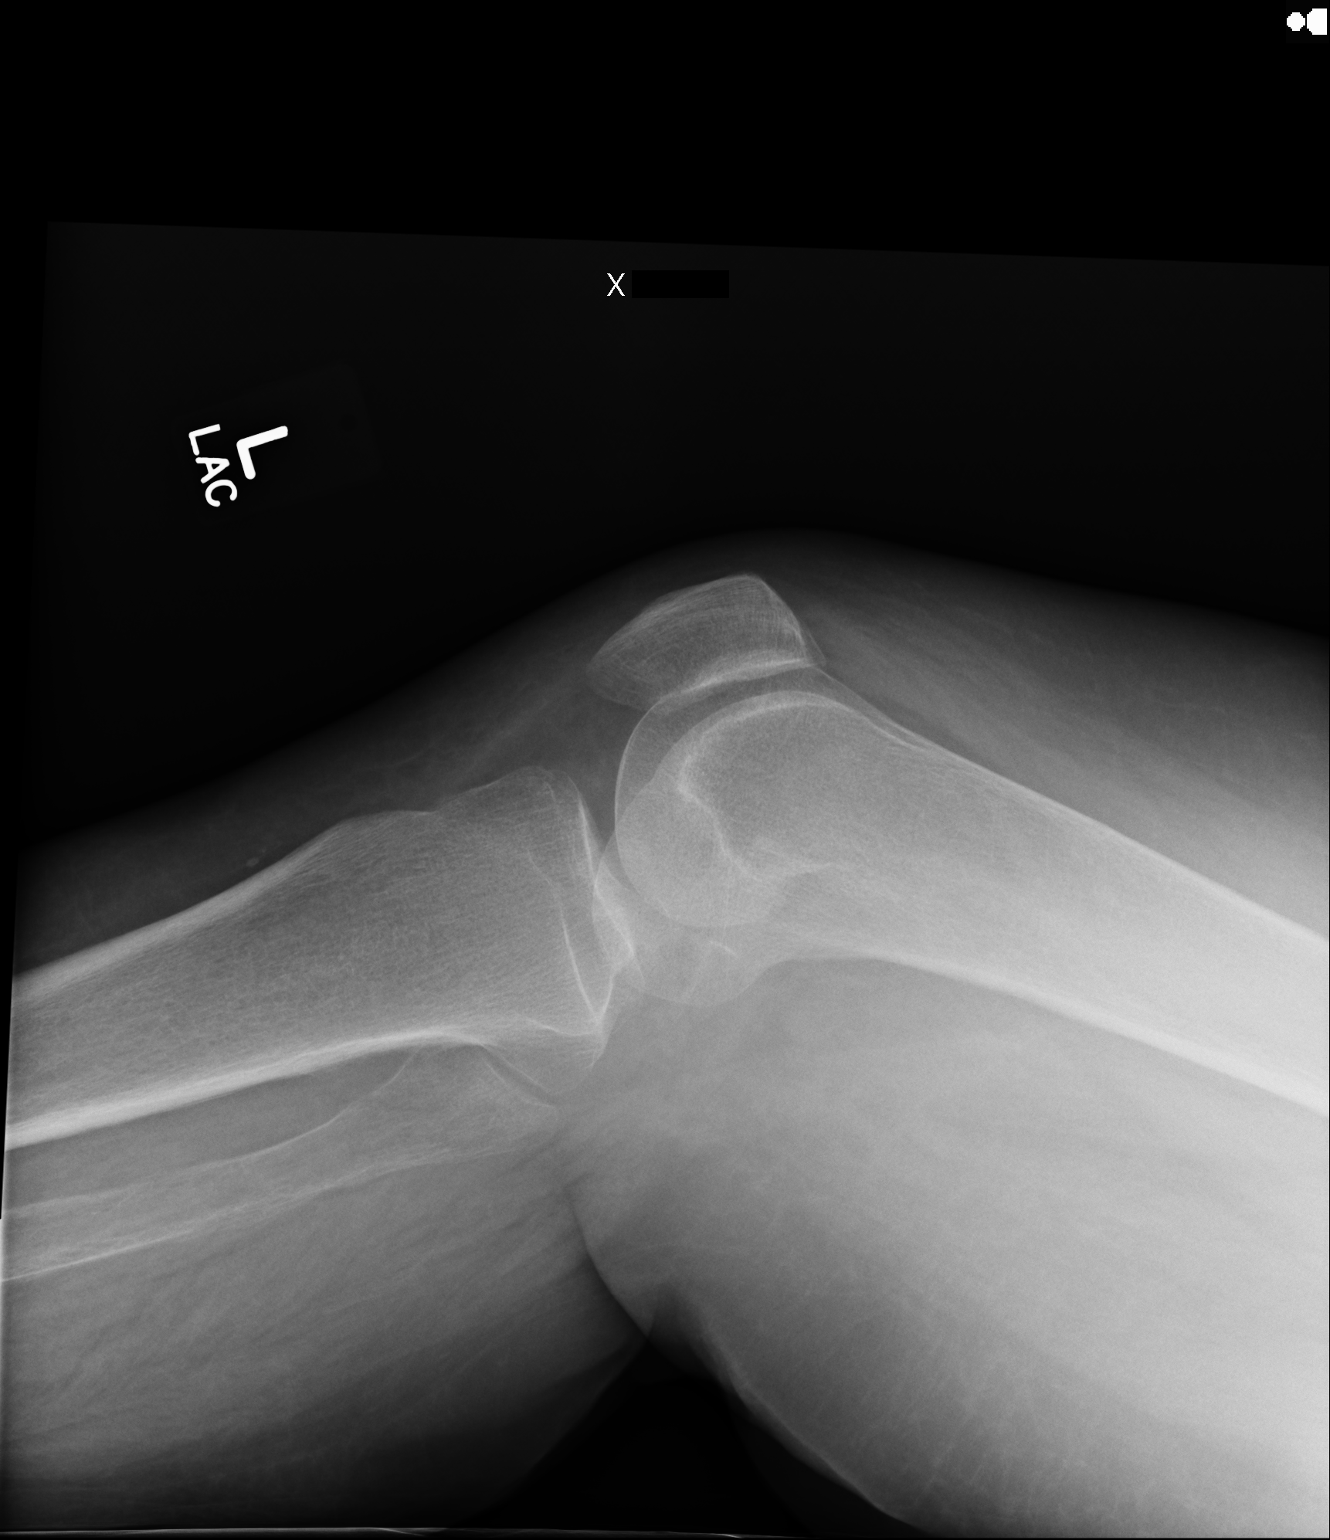

[2 of 2 positions shown; findings below may reference images not displayed]

FINDINGS: Two views of the left knee submitted. Diffuse osteopenia is noted.
No acute fracture or subluxation. Mild narrowing of medial joint
compartment. No joint effusion.
IMPRESSION: No acute fracture or subluxation. Mild narrowing of medial joint
compartment. No joint effusion.

## 2016-08-15 IMAGING — CT CT HEAD WITHOUT CONTRAST
3 of 6 series · 9 of 47 positions shown, 10 images · non-contrast
Comparison: CT head 05/22/2014.  CT cervical spine 04/25/2012.

CLINICAL DATA: Patient fell and head seizures x3 after falling.
History of seizures. Unsure loss of consciousness or whether struck
head.

EXAM:
CT HEAD WITHOUT CONTRAST
CT CERVICAL SPINE WITHOUT CONTRAST
TECHNIQUE: Multidetector CT imaging of the head and cervical spine was
performed following the standard protocol without intravenous
contrast. Multiplanar CT image reconstructions of the cervical spine
were also generated.

[Series 11: sag bone · sagittal · 0.19mm/px · 5 of 48 slices shown]
[im 8/48  bone]
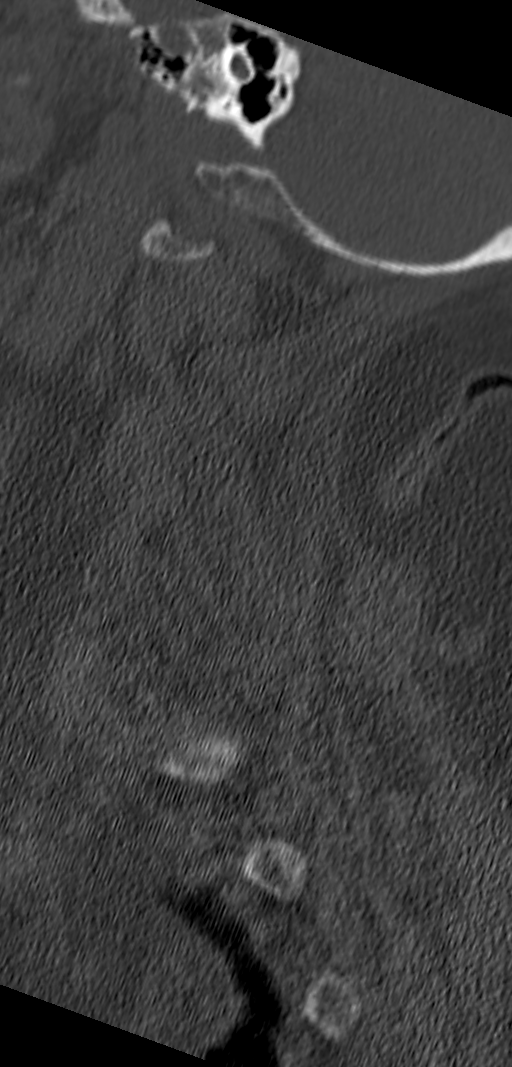
[im 16/48  bone]
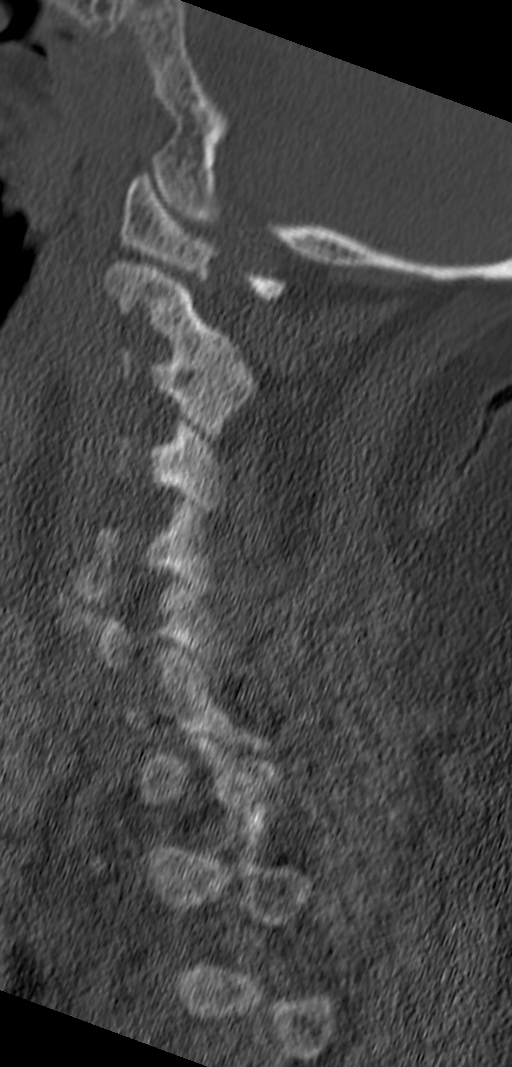
[im 24/48  bone]
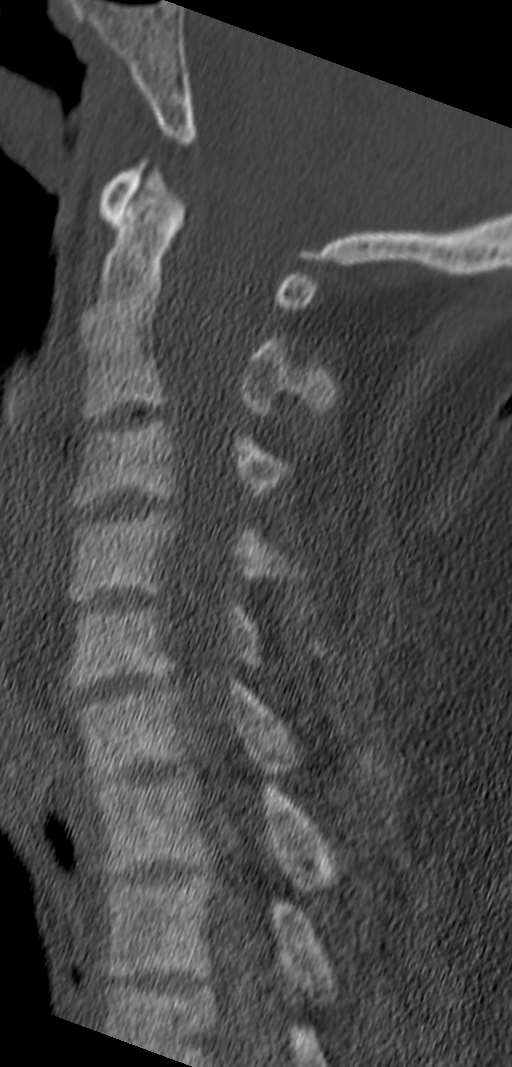
[im 32/48  bone]
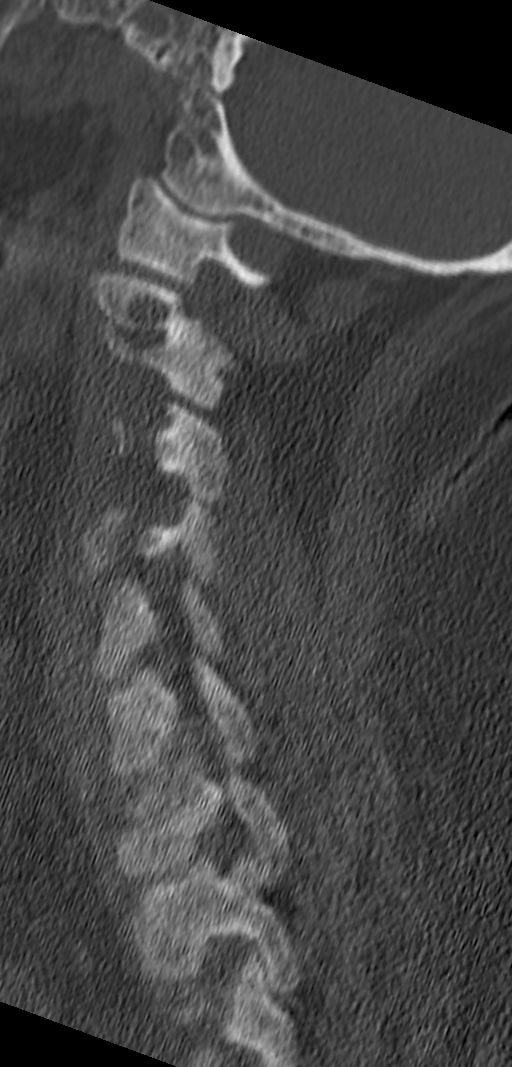
[im 40/48  bone]
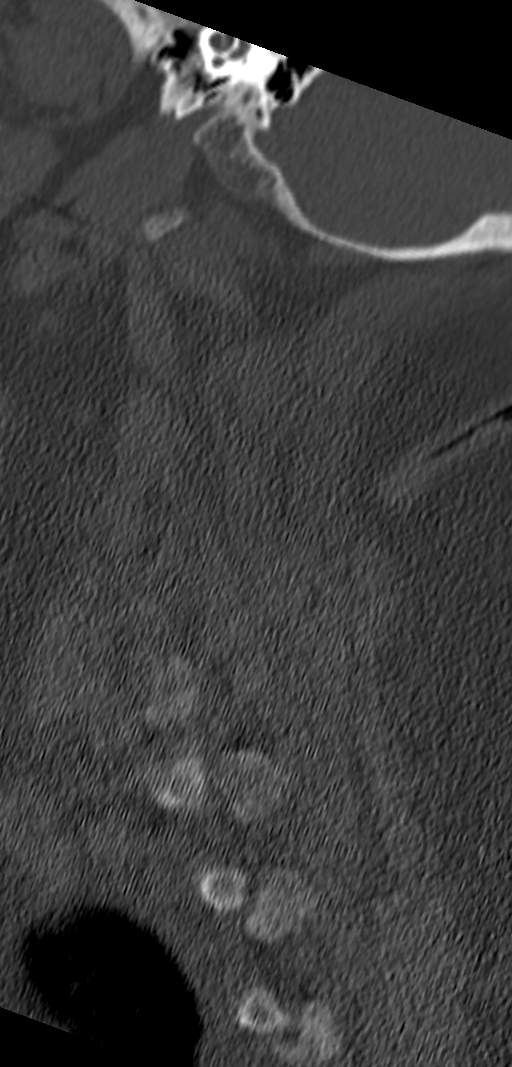

[Series 12: cor bone · coronal · 0.20mm/px · 3 of 43 slices shown]
[im 15/43  bone]
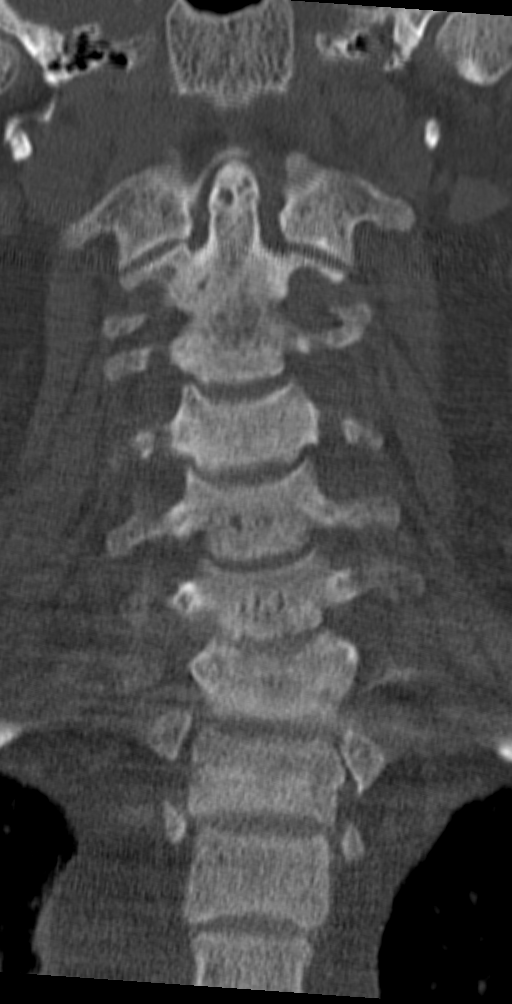
[im 19/43  bone]
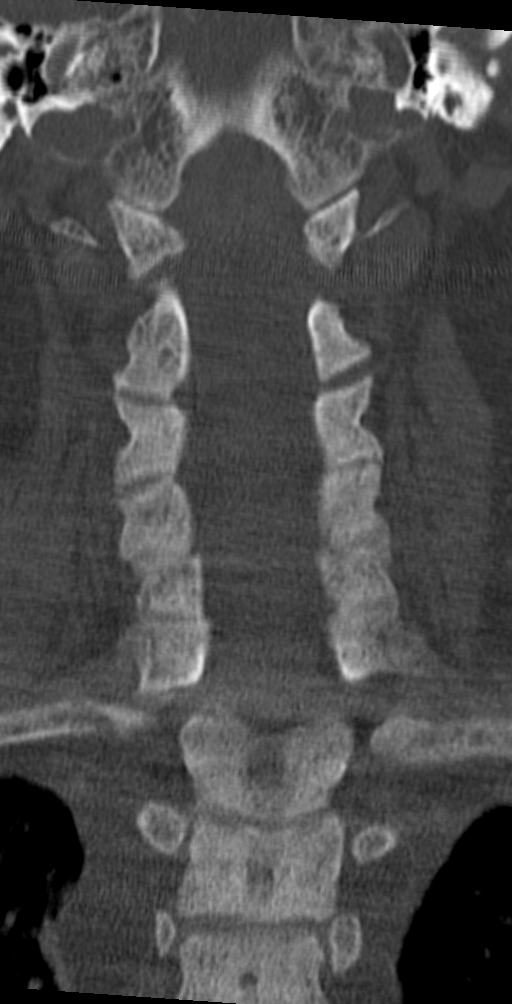
[im 24/43  bone]
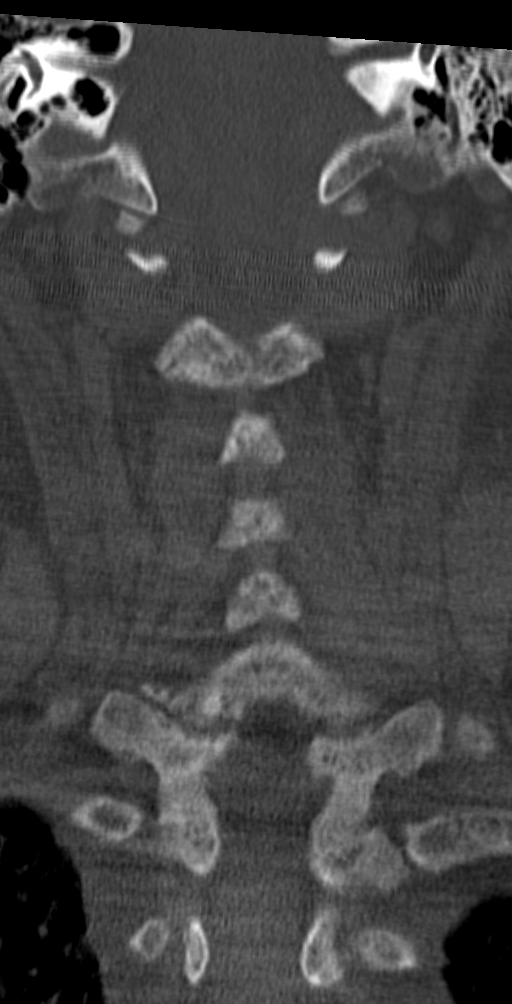

[Series 13: orthogonal axials · axial · 0.18mm/px · z∈[-221,-221]mm · 1 of 98 slices shown, 2 images]
[im 53/98  brain]
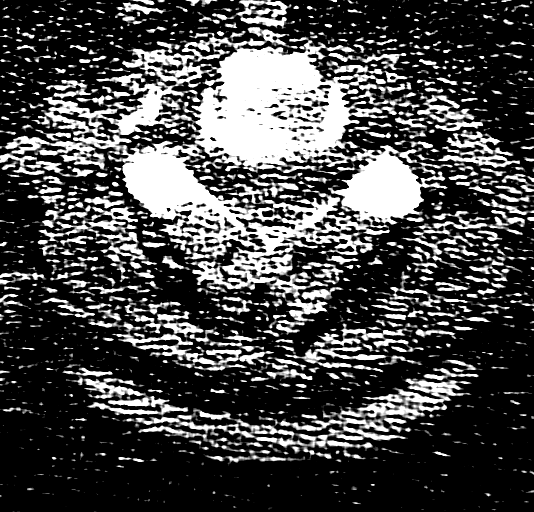
[im 53/98  bone]
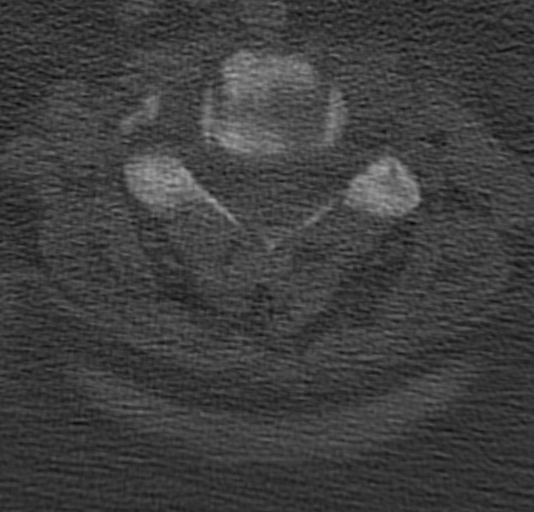

[9 of 47 positions shown; findings below may reference images not displayed]

FINDINGS: CT HEAD FINDINGS

Diffuse cerebral atrophy. Low-attenuation changes in the deep white
matter consistent with small vessel ischemia. Subependymal
calcifications in the ventricles, stable since previous study. No
mass effect or midline shift. No abnormal extra-axial fluid
collections. Gray-white matter junctions are distinct. Basal
cisterns are not effaced. No evidence of acute intracranial
hemorrhage. No depressed skull fractures. Calvaria changes including
hyperostosis from talus and lucent lesion in the left parietal
region probably representing hemangioma are unchanged. Mucosal
thickening in the ethmoid air cells. Paranasal sinuses are otherwise
clear.

CT CERVICAL SPINE FINDINGS

Examination is technically limited due to streak artifact and
patient's body habitus. There is straightening of the usual cervical
lordosis which may be due to patient positioning but ligamentous
injury or muscle spasm could also have this appearance. No anterior
subluxation. Normal alignment of facet joints. There is coalition of
C2-C3, likely congenital. Degenerative changes throughout the
cervical spine with narrowed cervical interspaces and endplate
hypertrophic changes. No prevertebral soft tissue swelling. No
vertebral compression deformities. No focal bone lesion or bone
destruction. Bone cortex and trabecular architecture appear intact.
Soft tissues are unremarkable. Emphysematous changes in the lung
apices.
IMPRESSION: No acute intracranial abnormalities. Stable chronic changes since
previous study included cerebral atrophy. Small vessel ischemic
changes, some minimal calcifications, and calvarial changes.

Nonspecific straightening of the usual cervical lordosis.
Degenerative changes. Coalition of C2-C3, likely congenital.

## 2016-09-08 IMAGING — CR DG CHEST 1V PORT
1 series · 1 of 1 positions shown · non-contrast
Comparison: 05/25/2011

CLINICAL DATA: Sepsis, weakness, and shortness of breath.

EXAM:
PORTABLE CHEST - 1 VIEW

[AP]
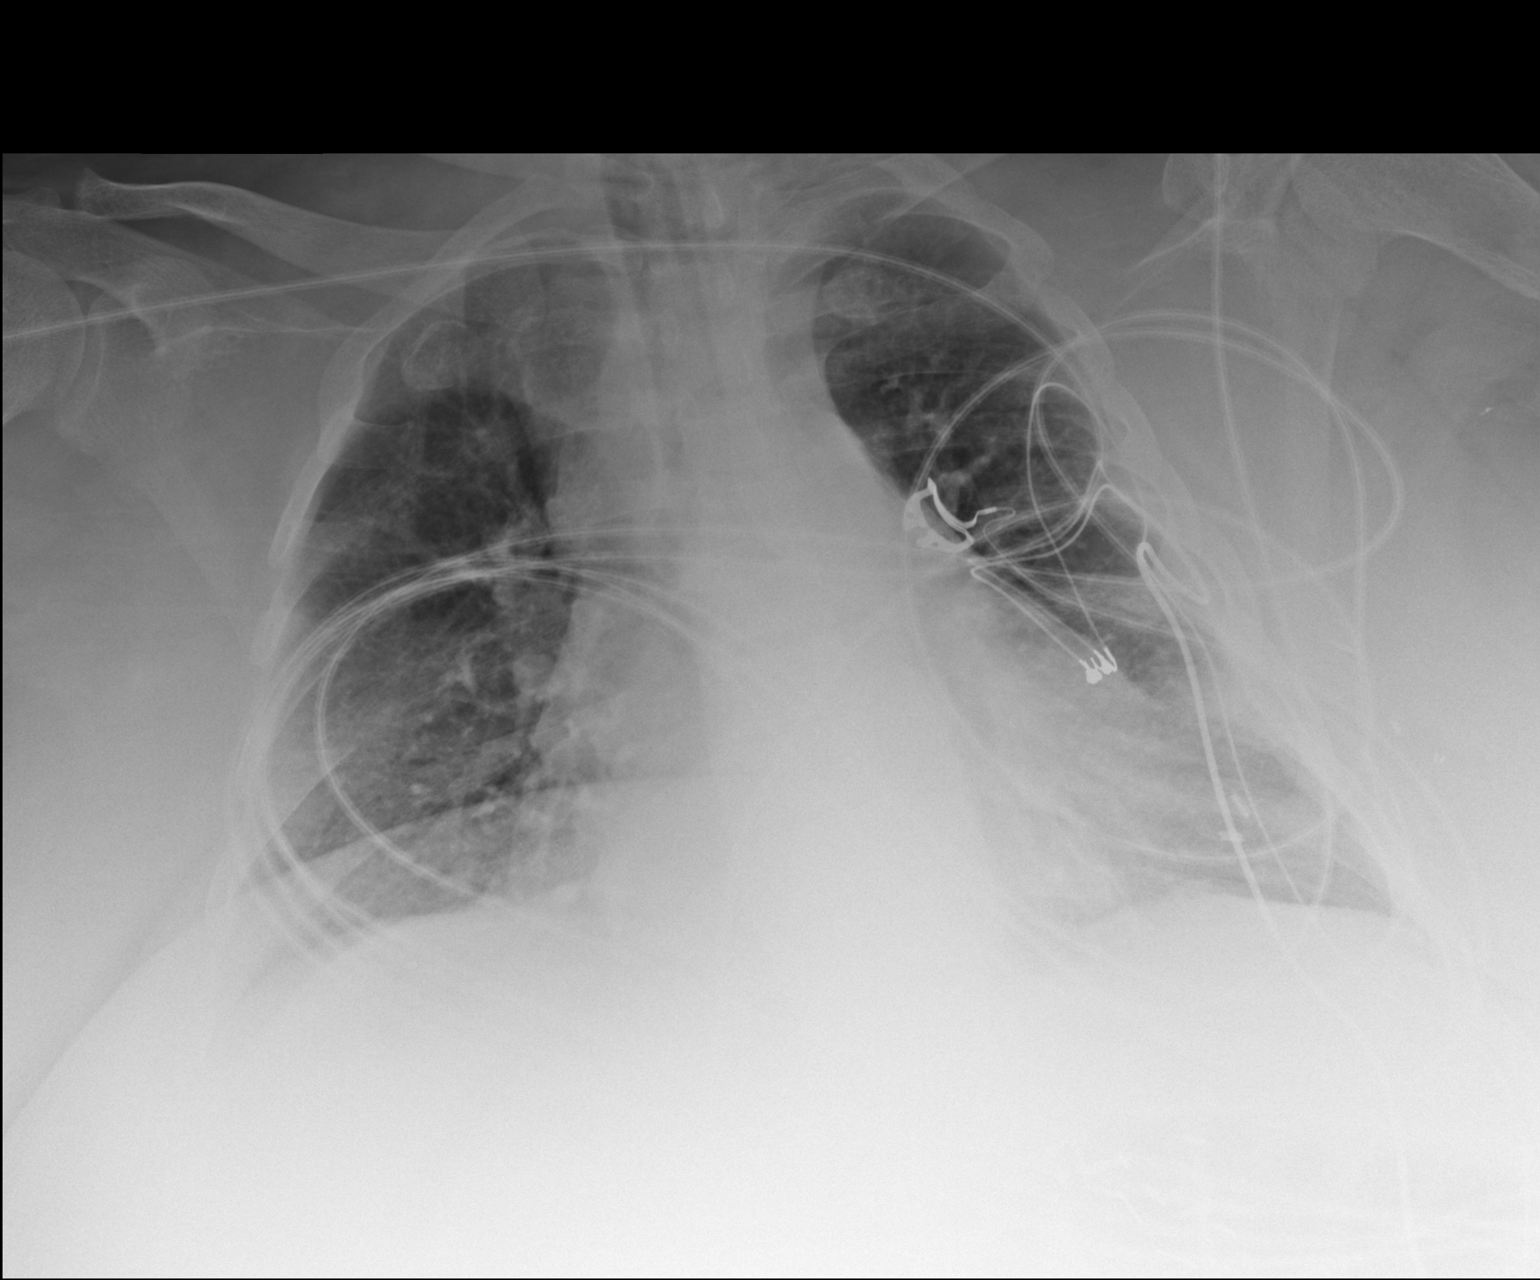

[1 of 1 positions shown; findings below may reference images not displayed]

FINDINGS: Shallow inspiration. Cardiac enlargement without vascular
congestion. No focal airspace disease in the lungs. No blunting of
costophrenic angles. No pneumothorax. Interval removal of previous
central venous catheter.
IMPRESSION: Cardiac enlargement.  No evidence of active pulmonary disease.

## 2016-09-08 IMAGING — CT CT HEAD W/O CM
1 of 2 series · 14 of 30 positions shown, 18 images · non-contrast
Comparison: 07/31/2011

CLINICAL DATA: Lightheaded and dizzy for 2 weeks

EXAM:
CT HEAD WITHOUT CONTRAST
TECHNIQUE: Contiguous axial images were obtained from the base of the skull
through the vertex without intravenous contrast.

[Series 2: head 5.0 h30s · axial · 0.49mm/px · z∈[-93,+47]mm · 14 of 34 slices shown, 18 images]
[im 3/34  brain]
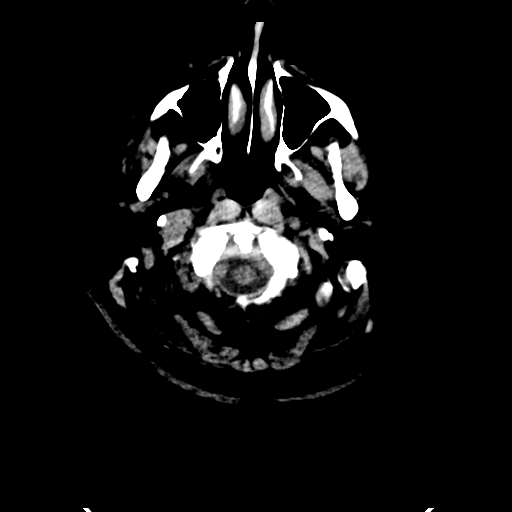
[im 3/34  bone]
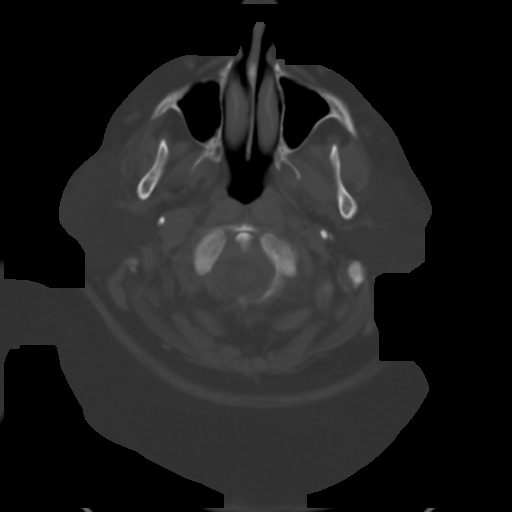
[im 5/34  brain]
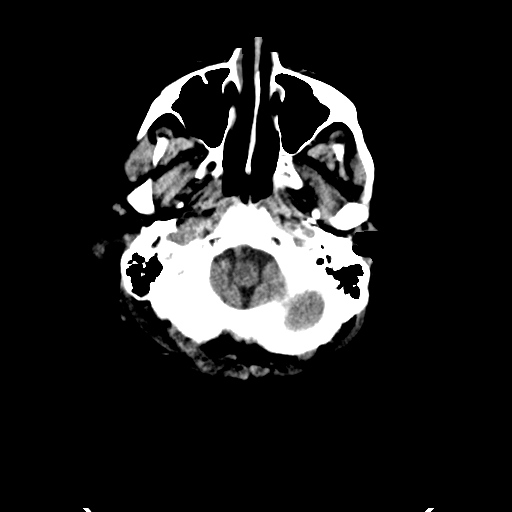
[im 7/34  brain]
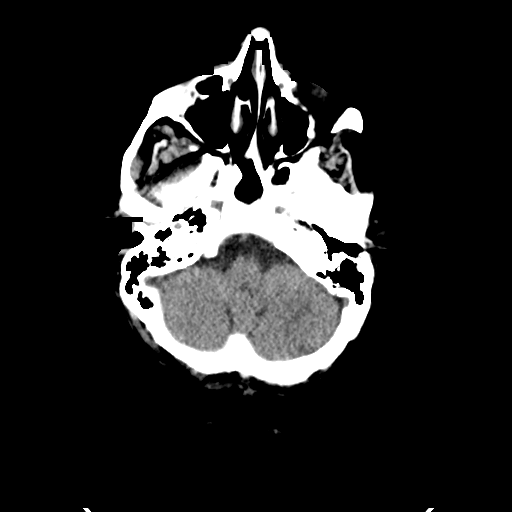
[im 9/34  brain]
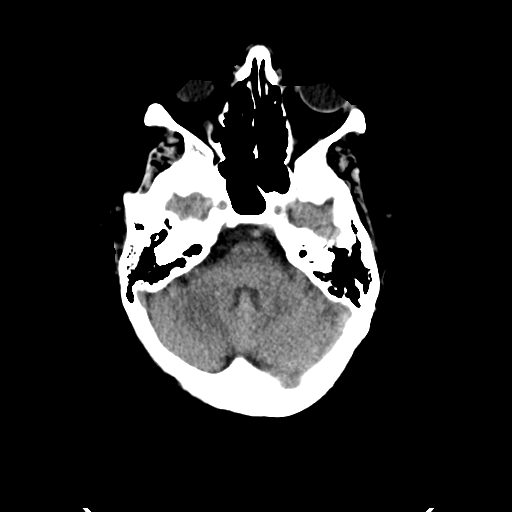
[im 12/34  brain]
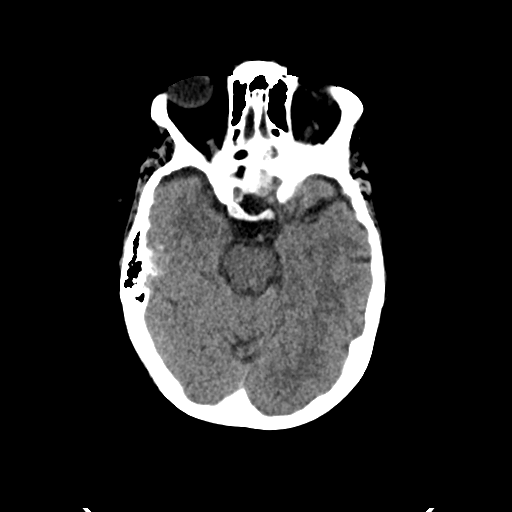
[im 12/34  bone]
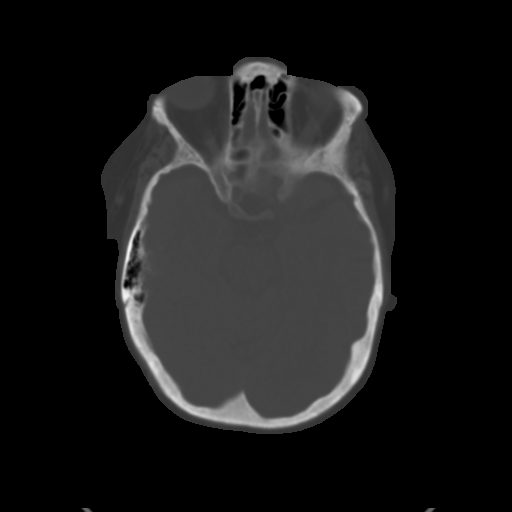
[im 14/34  brain]
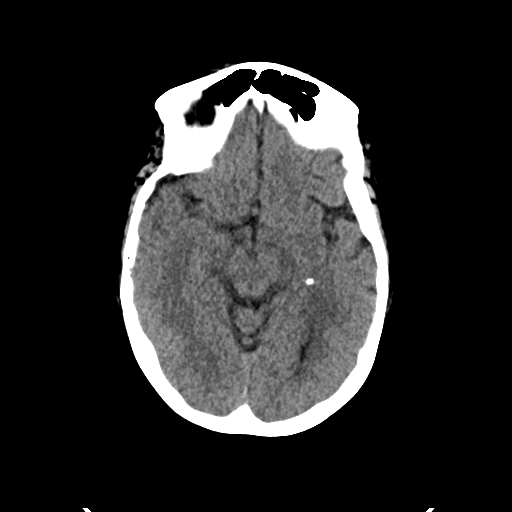
[im 16/34  brain]
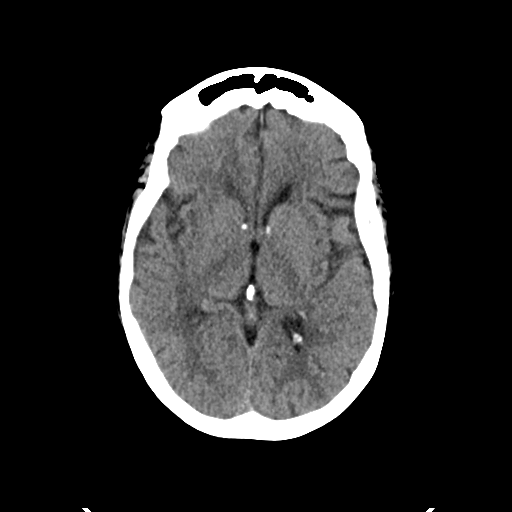
[im 18/34  brain]
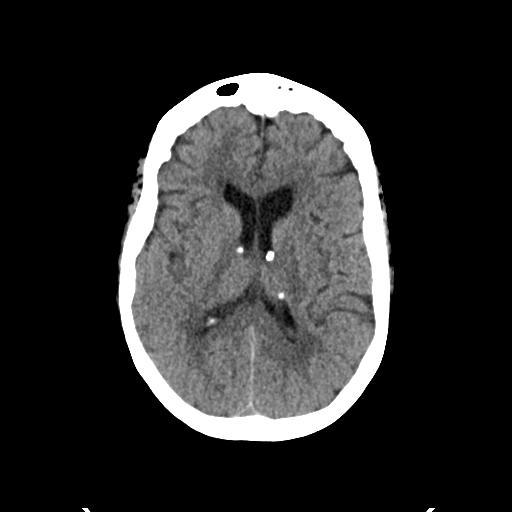
[im 20/34  brain]
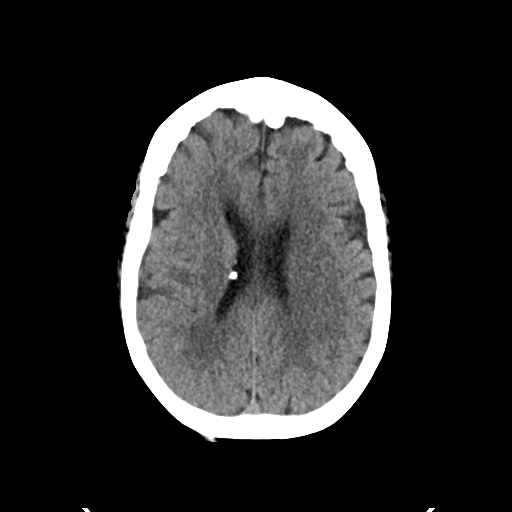
[im 20/34  bone]
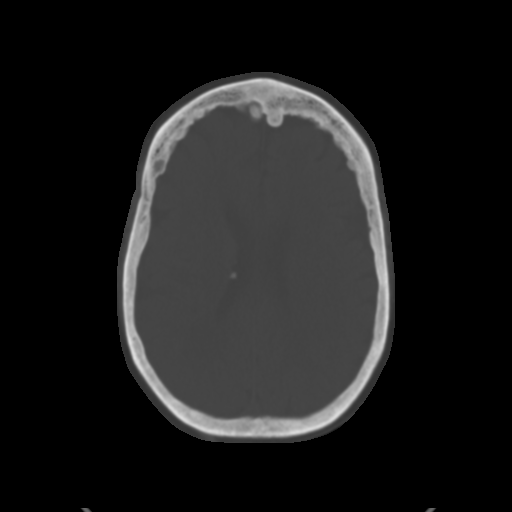
[im 23/34  brain]
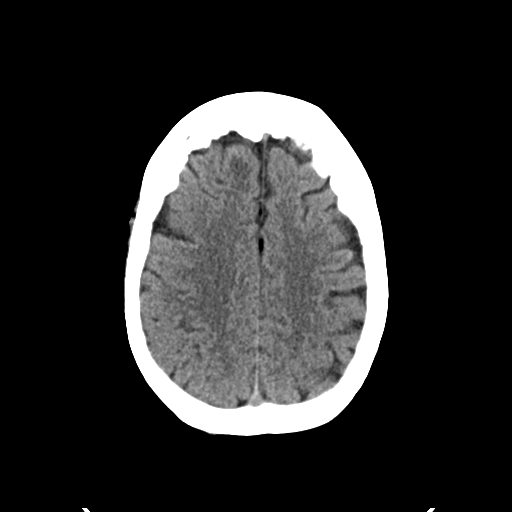
[im 25/34  brain]
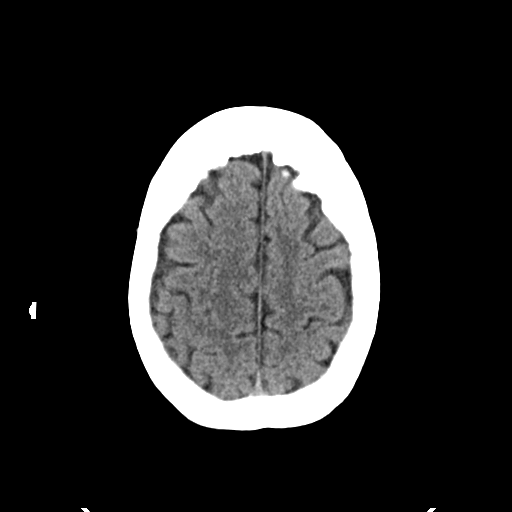
[im 27/34  brain]
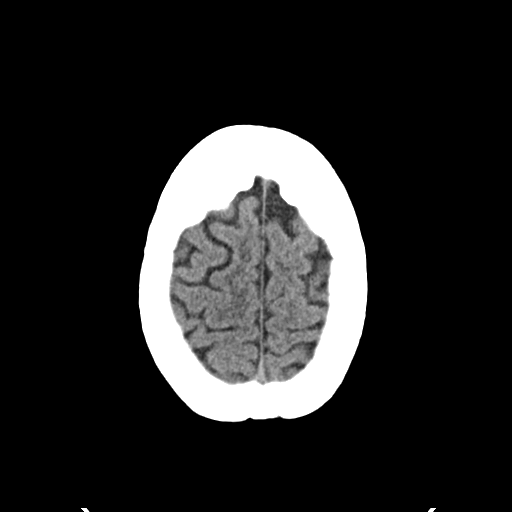
[im 29/34  brain]
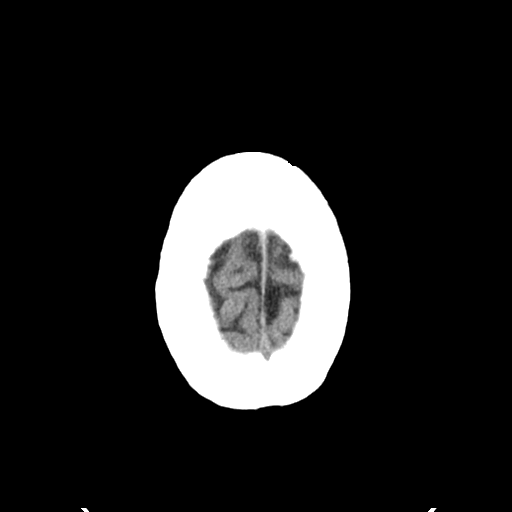
[im 29/34  bone]
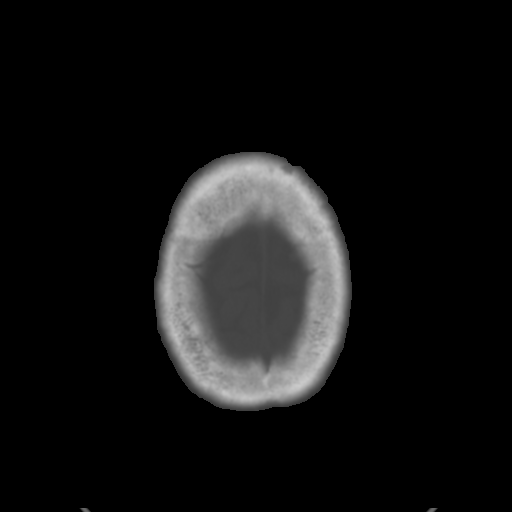
[im 31/34  brain]
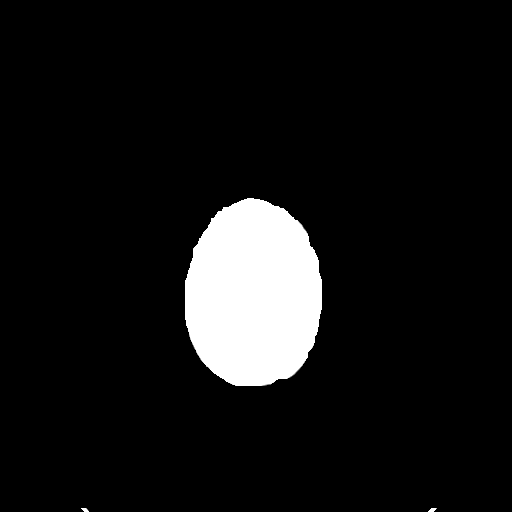

[14 of 30 positions shown; findings below may reference images not displayed]

FINDINGS: The bony calvarium is intact. No gross soft tissue abnormality is
noted. Mild atrophic changes are noted. Subependymal calcifications
are noted consistent with tuberous sclerosis. No acute hemorrhage,
acute infarction or space-occupying mass lesion is identified.
IMPRESSION: No acute abnormality noted.

Stable subependymal calcifications.

## 2016-09-12 IMAGING — US US ABDOMEN LIMITED
1 series · 14 of 20 positions shown · non-contrast
Comparison: None.

CLINICAL DATA: Elevated alkaline phosphatase. Status post
cholecystectomy.

EXAM:
US ABDOMEN LIMITED - RIGHT UPPER QUADRANT

[Series 1: us abdomen limited · 0.33mm/px · 14 of 20 slices shown]
[im 1/20]
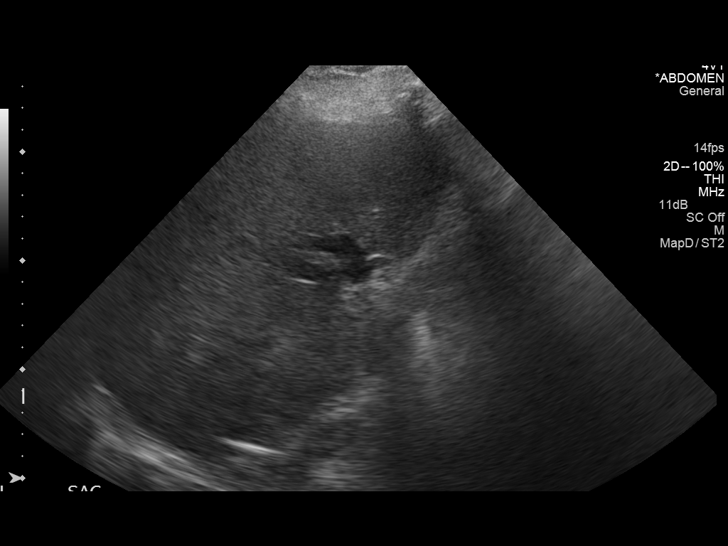
[im 3/20]
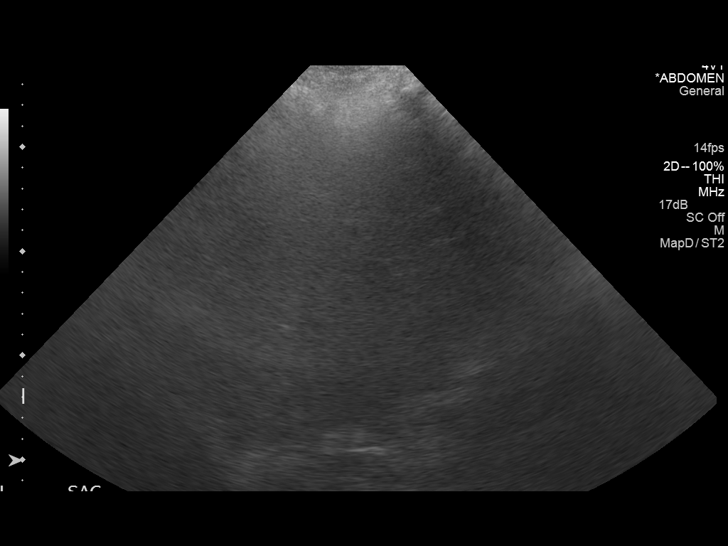
[im 4/20]
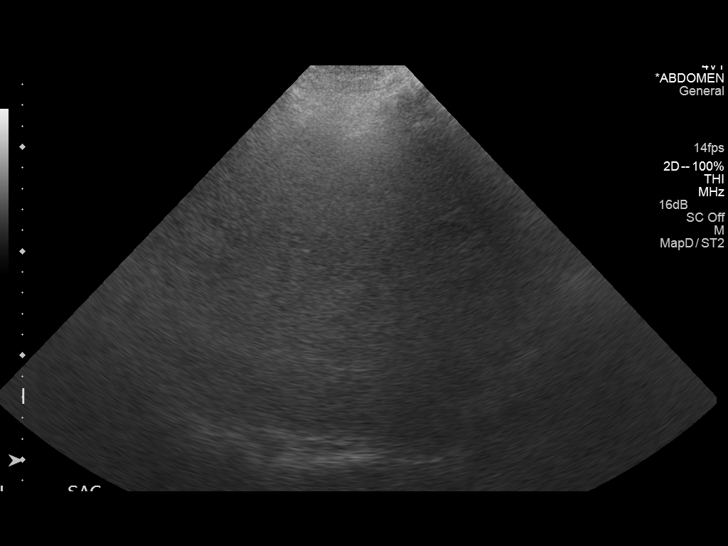
[im 6/20]
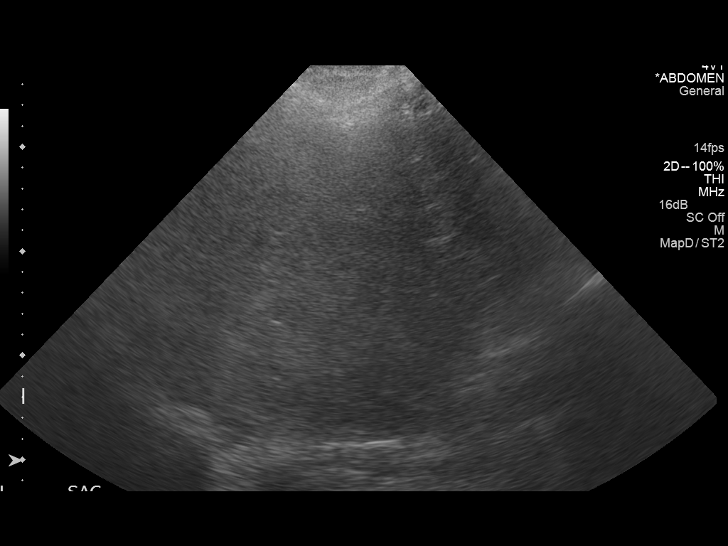
[im 7/20]
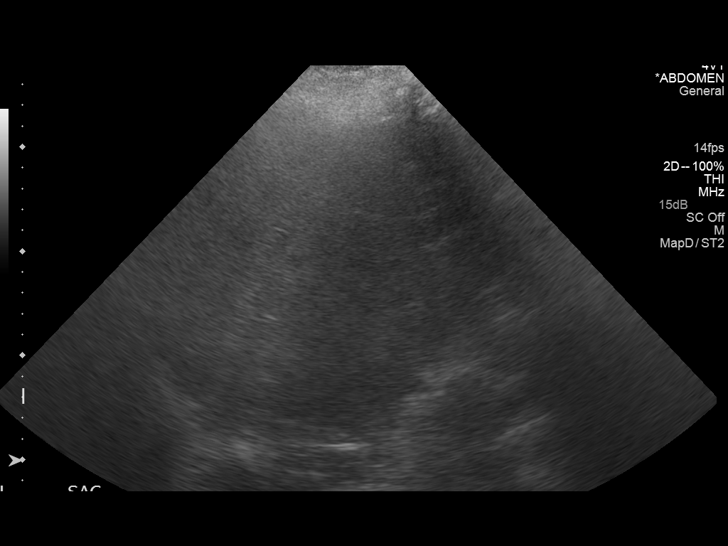
[im 8/20]
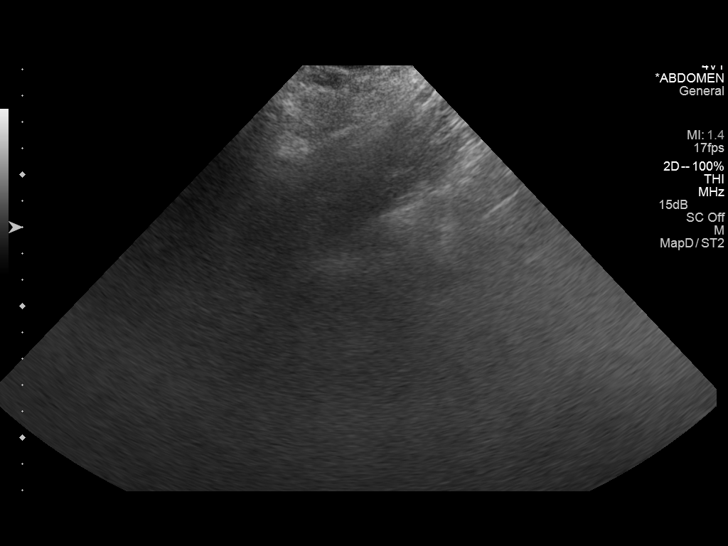
[im 10/20]
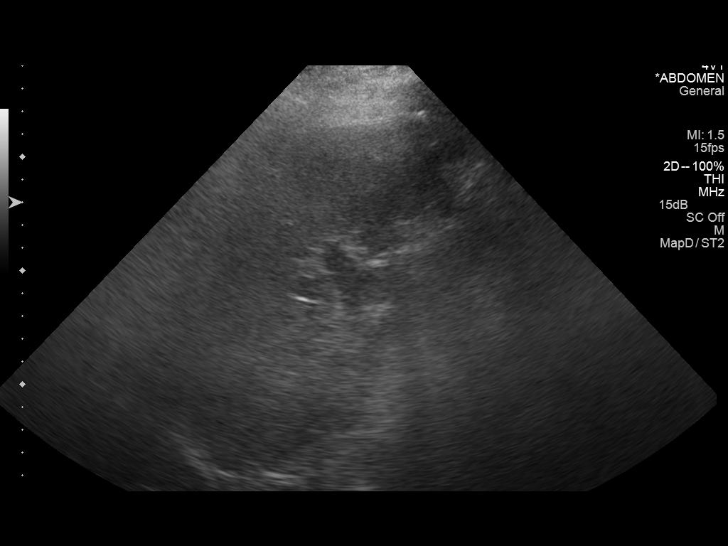
[im 11/20]
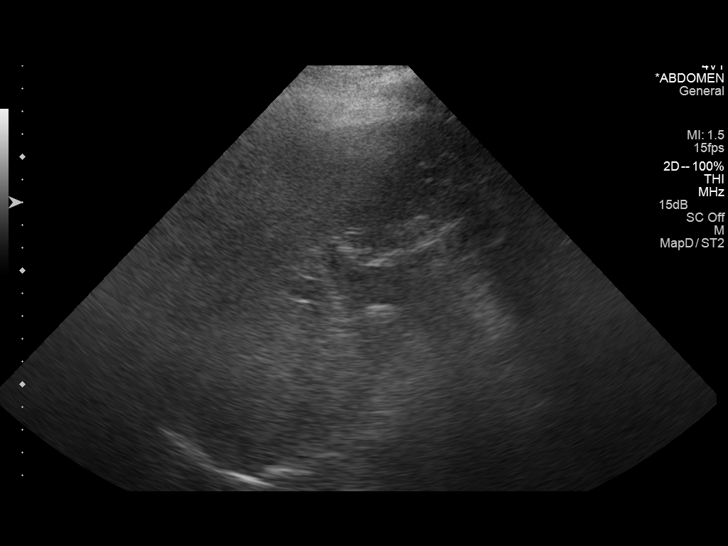
[im 13/20]
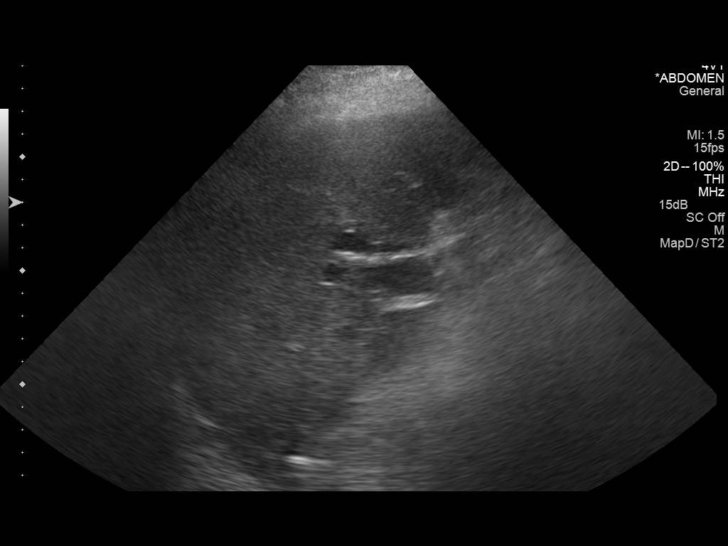
[im 14/20]
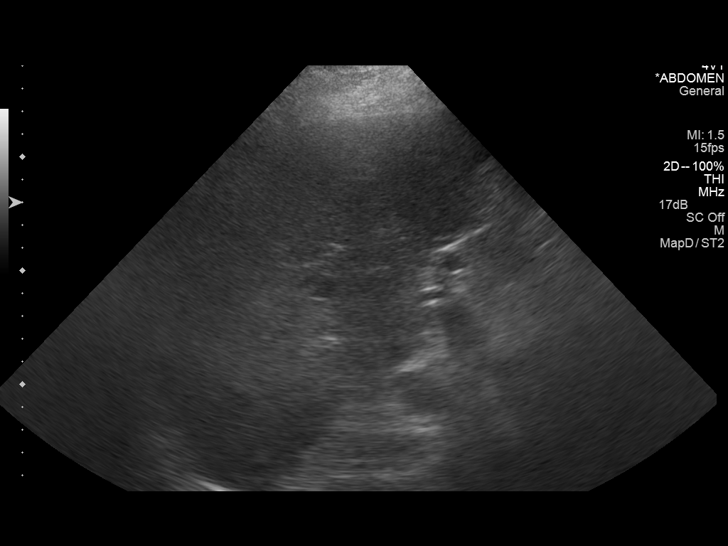
[im 16/20]
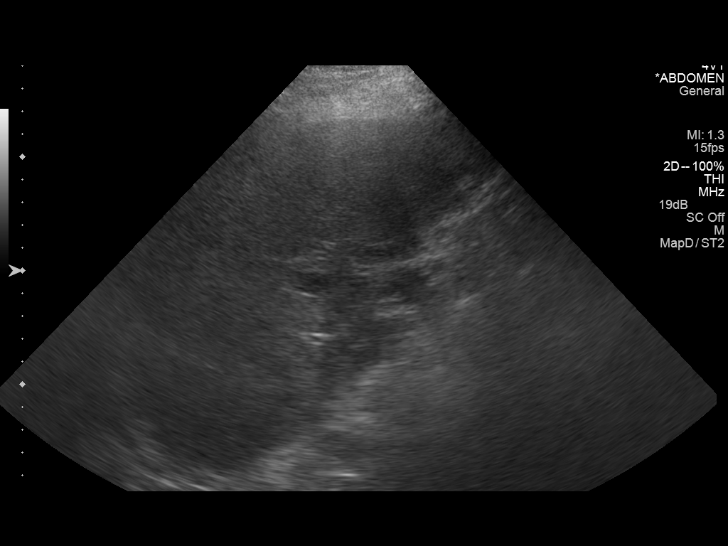
[im 17/20]
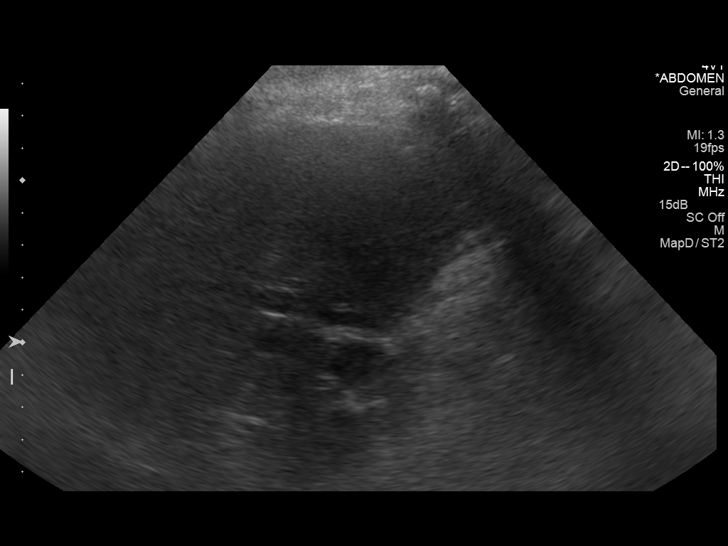
[im 18/20]
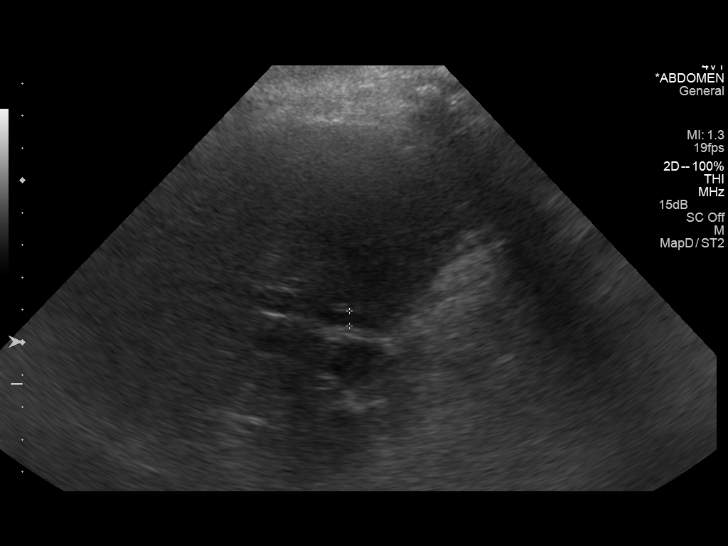
[im 20/20]
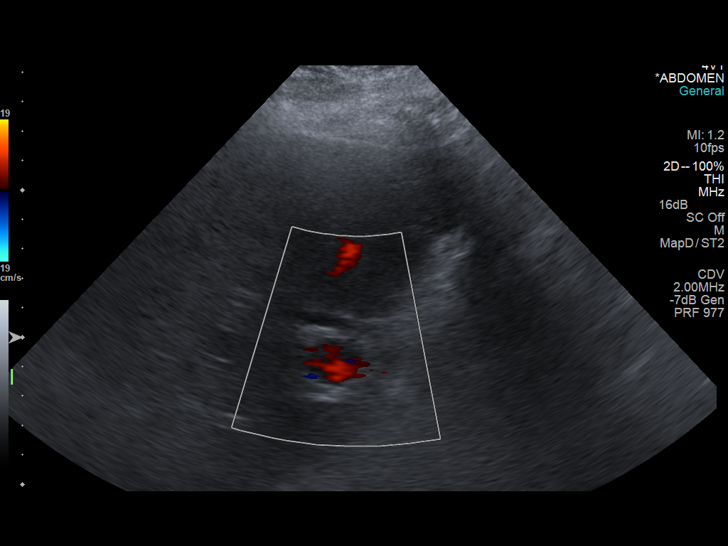

[14 of 20 positions shown; findings below may reference images not displayed]

FINDINGS: Gallbladder:

Gallbladder surgically absent.

Common bile duct:

Diameter: 4.6 mm

Liver:

The liver is echogenic. There is attenuation of the ultrasound wave,
poor visualization of the internal hepatic architecture, and loss of
definition of the diaphragm. No focal hepatic abnormality
identified.

Study quality is degraded by patient body habitus.
IMPRESSION: 1. Status post cholecystectomy.
2. Fatty liver.

## 2016-10-19 IMAGING — CR DG CHEST 1V
1 series · 2 of 2 positions shown · non-contrast
Comparison: 04/17/2014

CLINICAL DATA: Hypotension, dizziness

EXAM:
CHEST  1 VIEW

[Series 1: ap · 0.17mm/px · 2 of 2 slices shown]
[im 1/2]
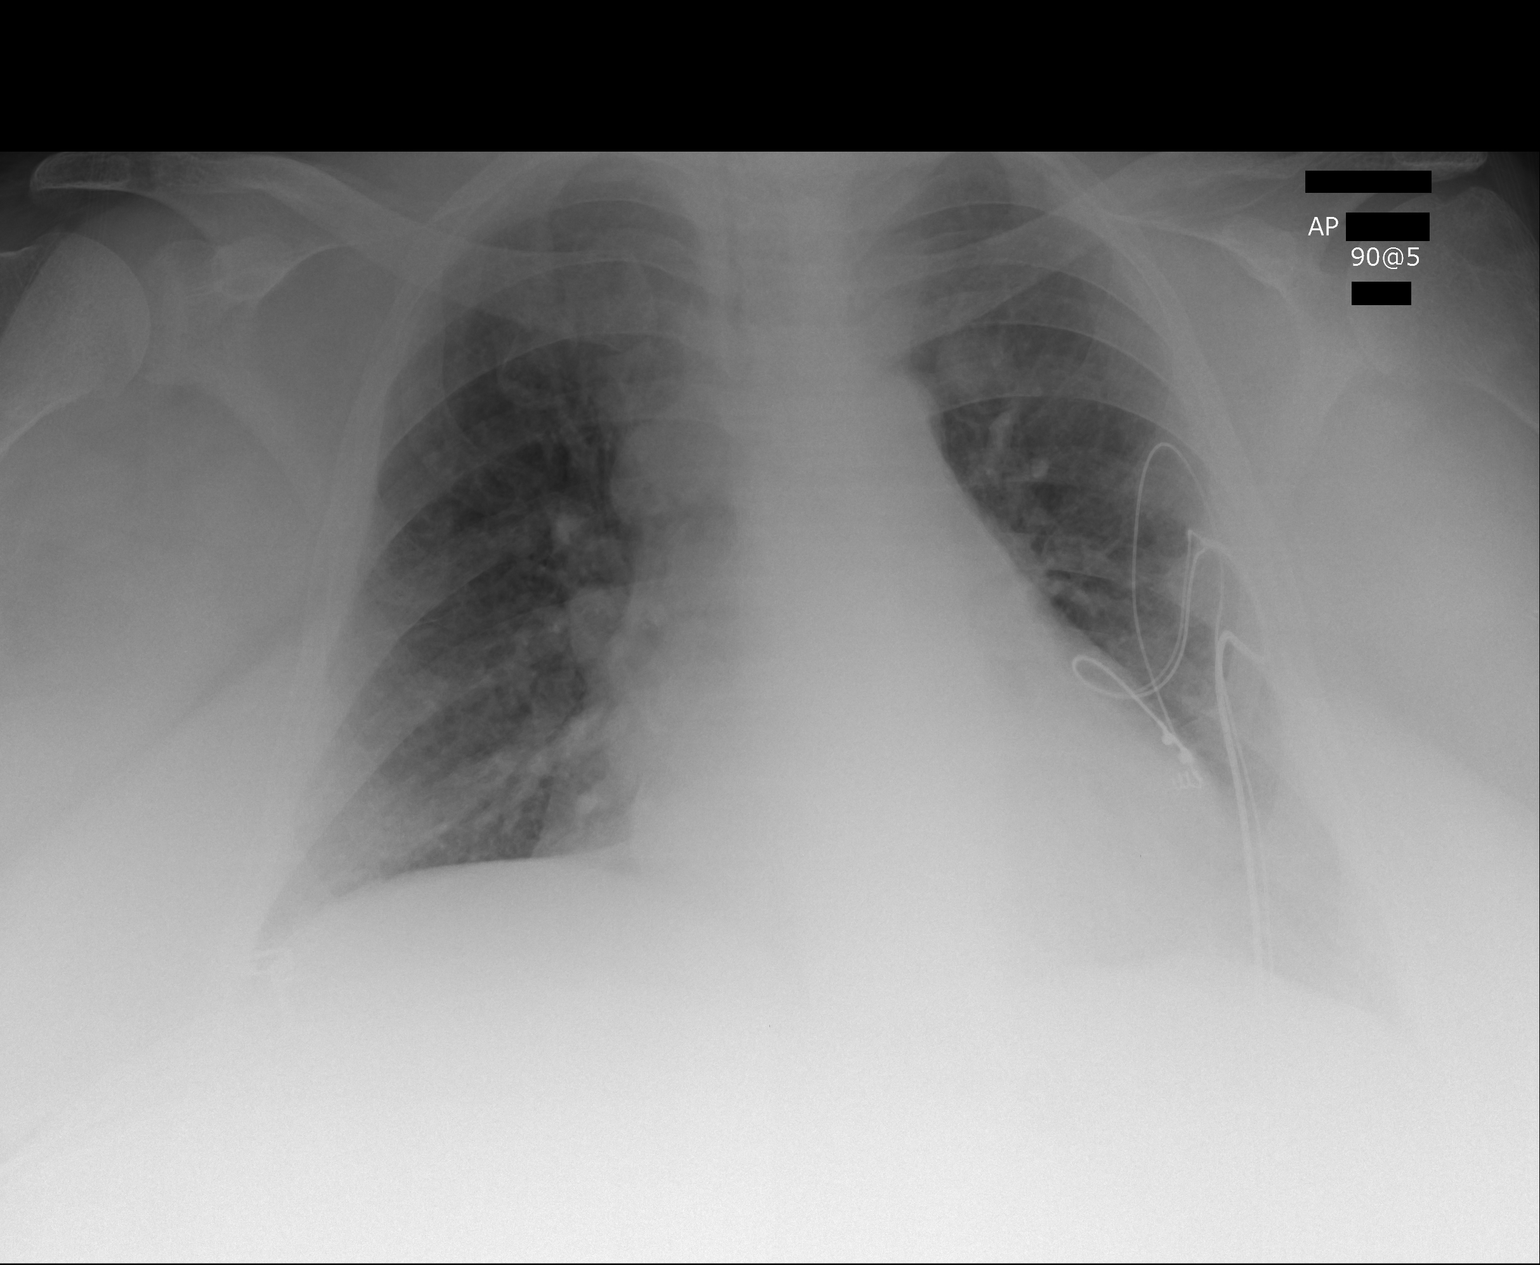
[im 2/2]
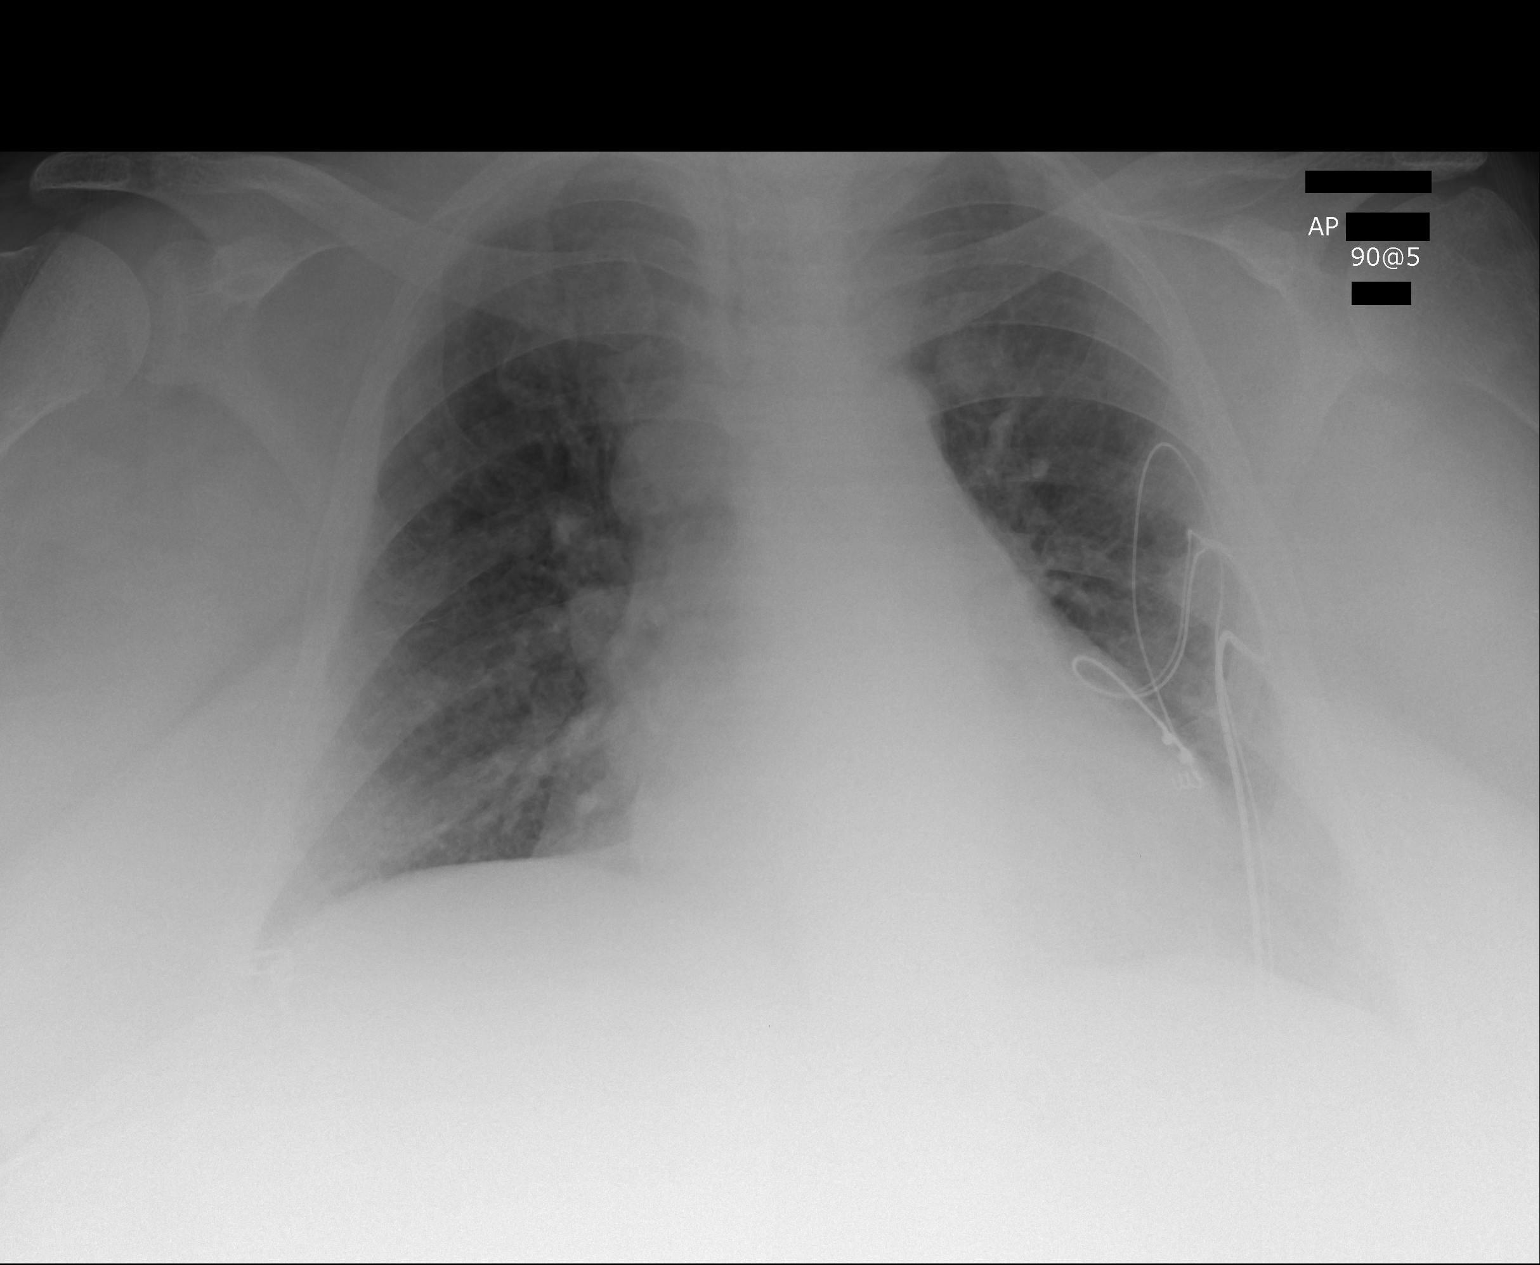

[2 of 2 positions shown; findings below may reference images not displayed]

FINDINGS: Cardiomegaly with pulmonary vascular congestion. Mild interstitial
edema is possible.

No pleural effusion or pneumothorax.
IMPRESSION: Cardiomegaly with pulmonary vascular congestion. Mild interstitial
edema is possible.

## 2016-10-20 IMAGING — CR DG CHEST 1V PORT
1 series · 3 of 3 positions shown · non-contrast
Comparison: 10/19/2014.

CLINICAL DATA: Hypoxia.

EXAM:
PORTABLE CHEST - 1 VIEW

[Series 1: portable · 0.17mm/px · 3 of 3 slices shown]
[im 1/3]
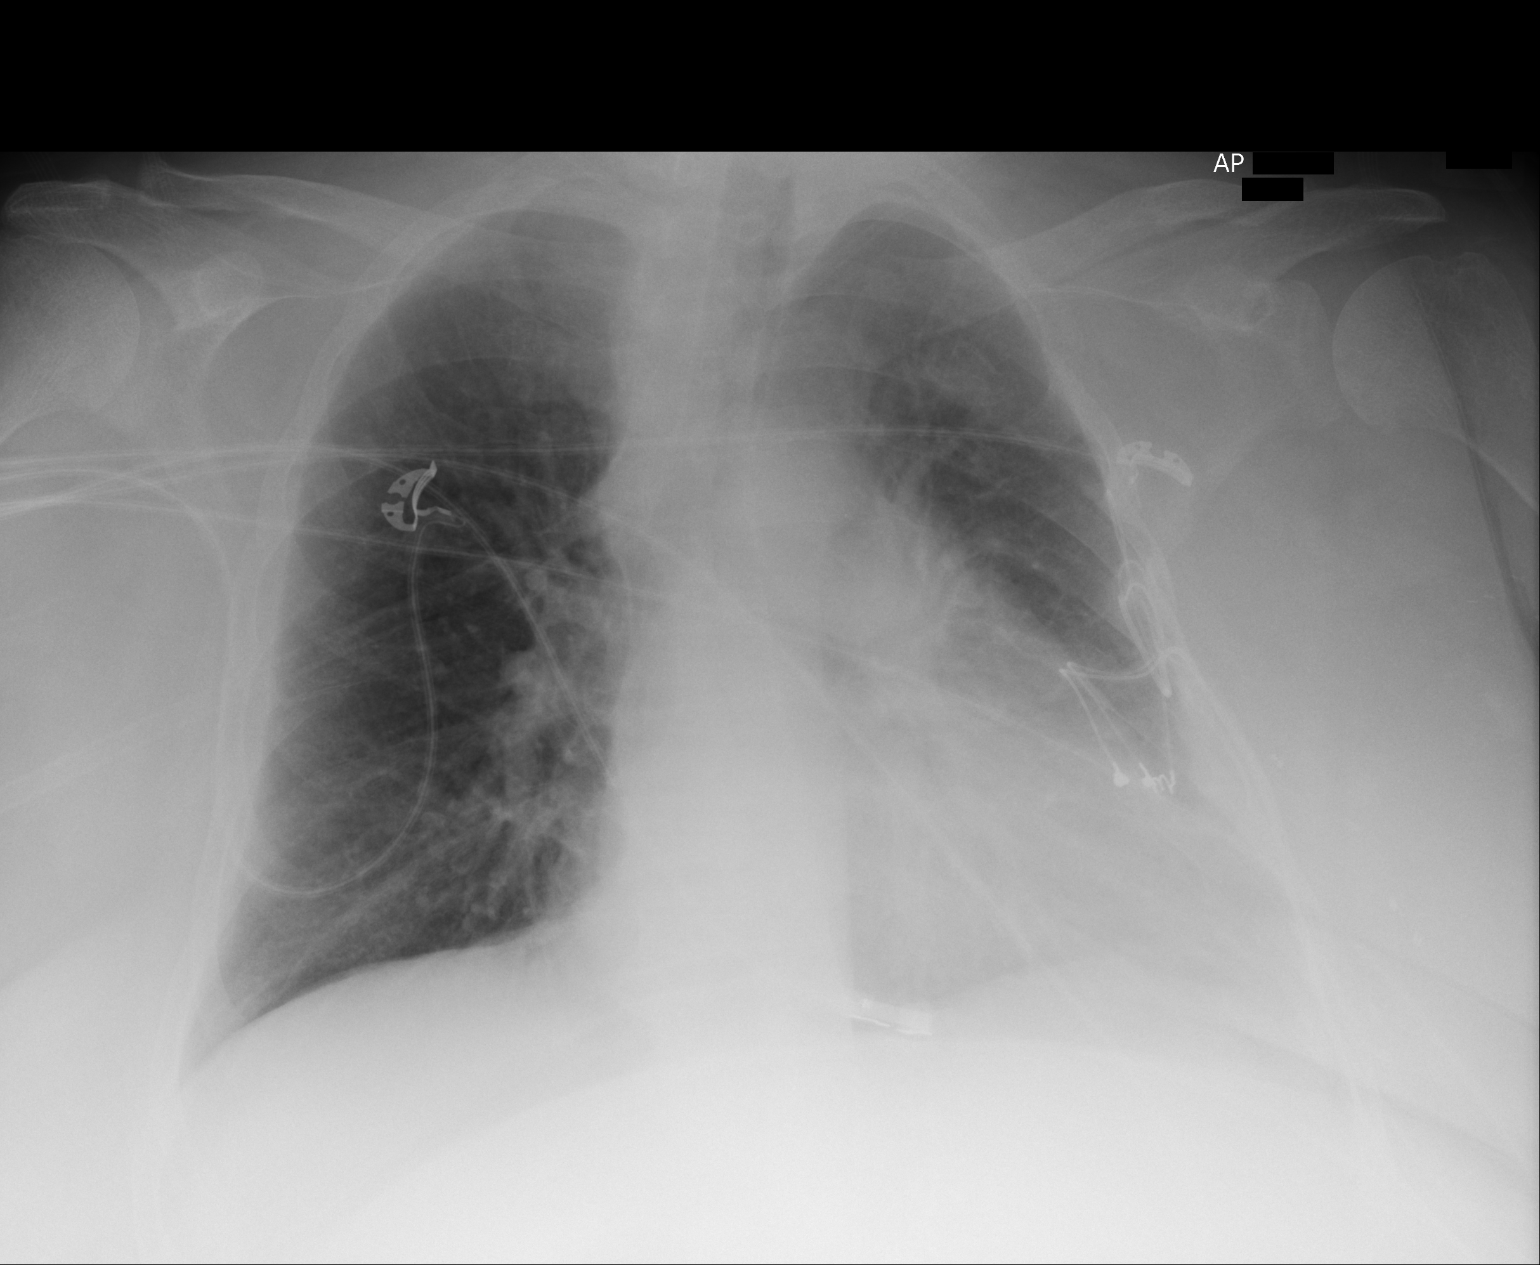
[im 2/3]
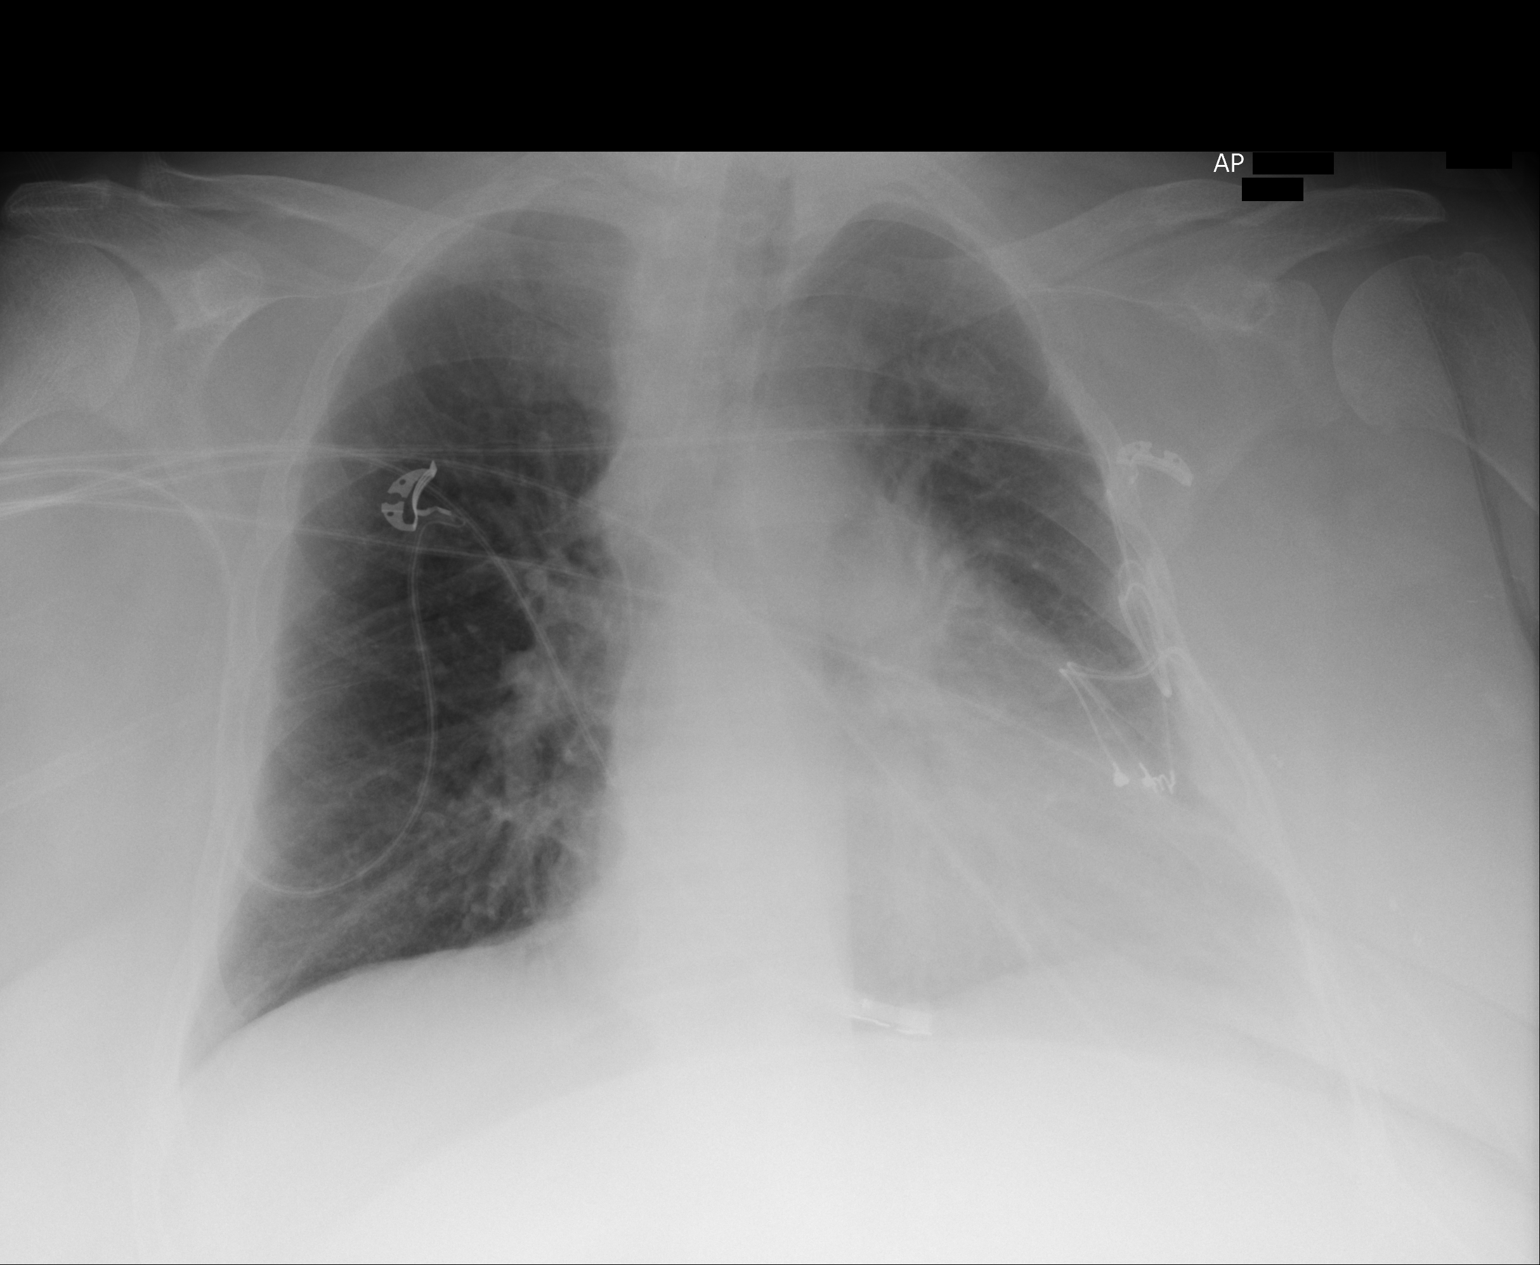
[im 3/3]
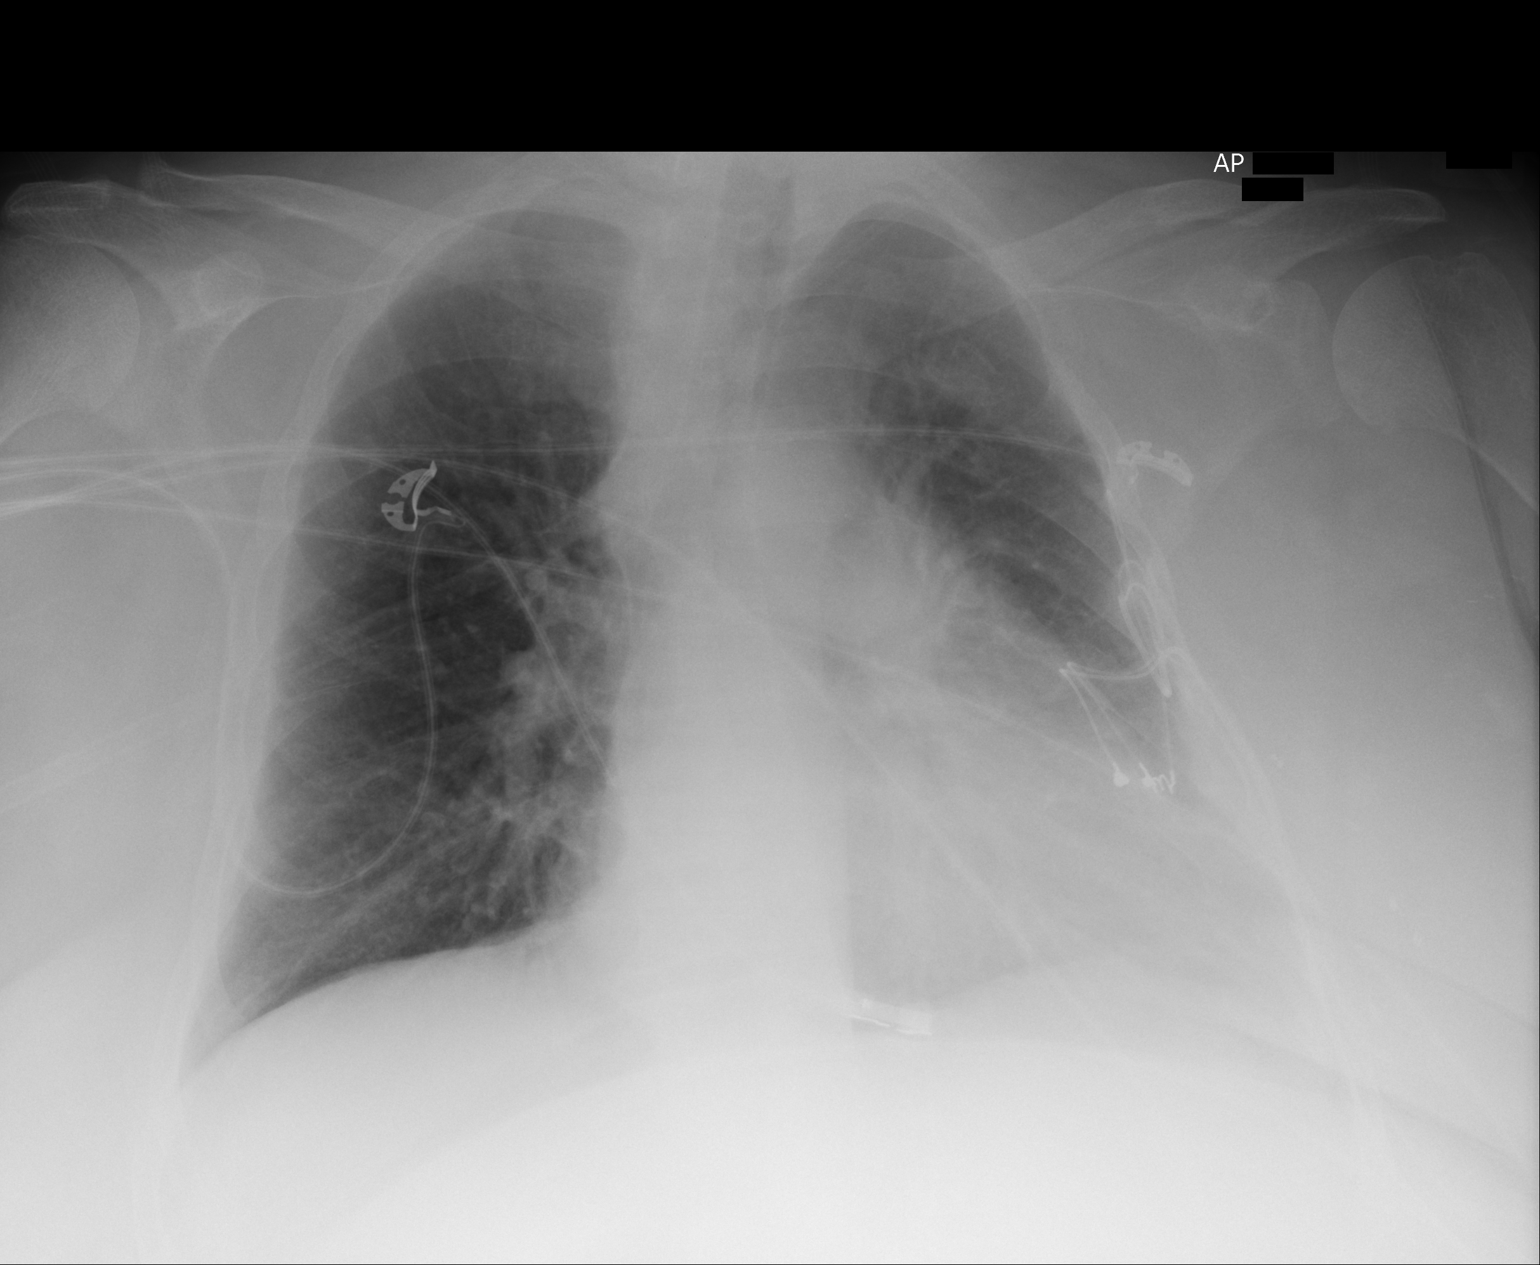

[3 of 3 positions shown; findings below may reference images not displayed]

FINDINGS: Cardiomegaly. Normal pulmonary vascularity. No focal pulmonary
infiltrate. Interval clearing of pulmonary interstitial edema .
Wiring noted over the left chest. No acute bony abnormality.
IMPRESSION: Stable cardiomegaly. No pulmonary venous congestion or focal
pulmonary infiltrate. Interval clearing of pulmonary interstitial
edema.

## 2016-10-21 IMAGING — CT CT ABD-PELV W/O CM
2 of 4 series · 15 of 46 positions shown, 17 images · non-contrast
Comparison: 09/09/2013 CT.

CLINICAL DATA: Constipation with right-sided abdominal pain.
Weakness. Initial encounter.

EXAM:
CT ABDOMEN AND PELVIS WITHOUT CONTRAST
TECHNIQUE: Multidetector CT imaging of the abdomen and pelvis was performed
following the standard protocol without IV contrast.

[Series 2: routine abd pel without · axial · non-contrast · 0.98mm/px · z∈[-21,+459]mm · 12 of 106 slices shown, 14 images]
[im 5/106  soft-tissue]
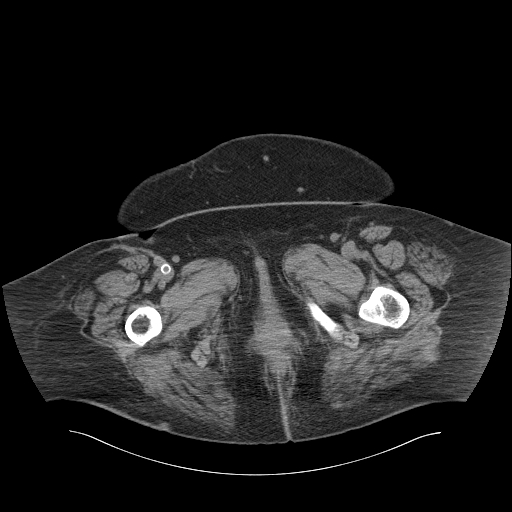
[im 5/106  bone]
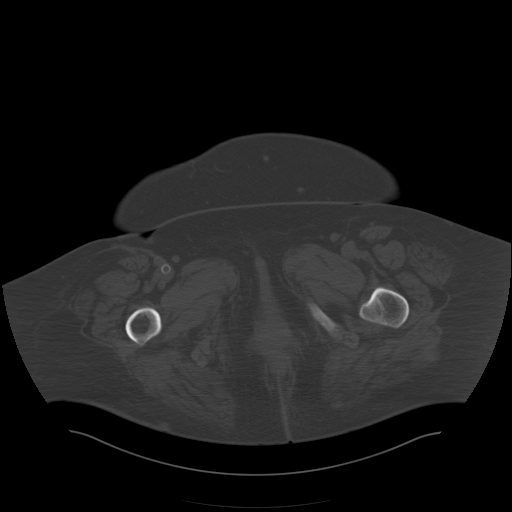
[im 14/106  soft-tissue]
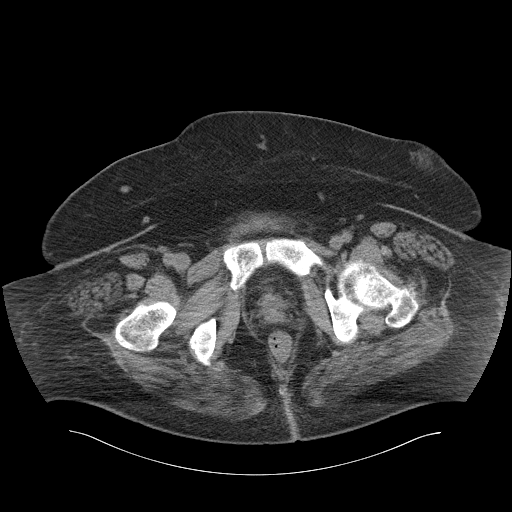
[im 23/106  soft-tissue]
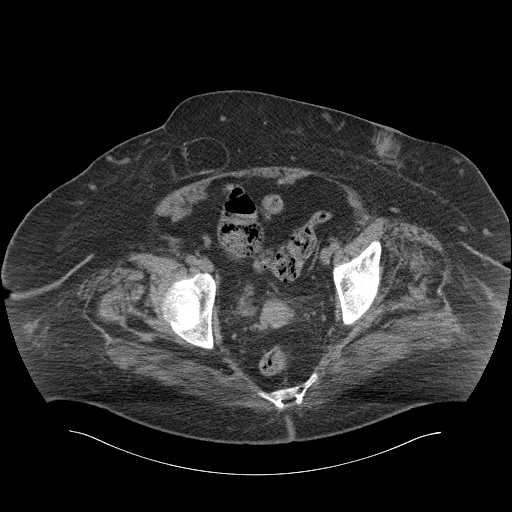
[im 32/106  soft-tissue]
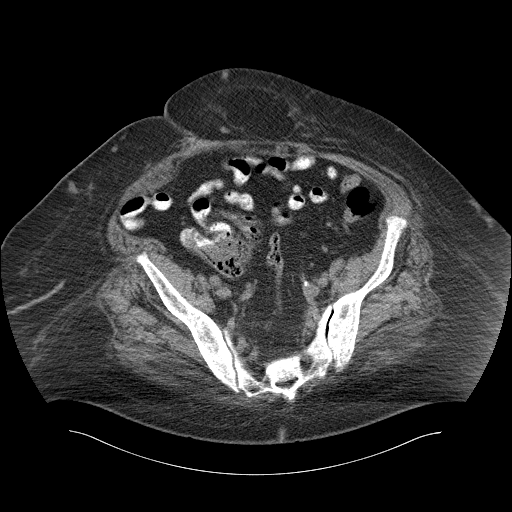
[im 42/106  soft-tissue]
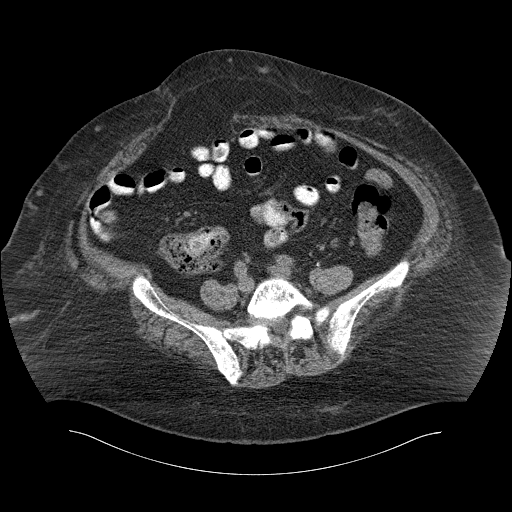
[im 51/106  soft-tissue]
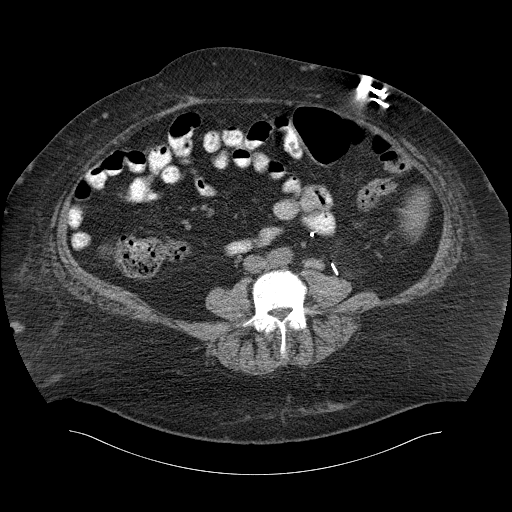
[im 55/106  soft-tissue]
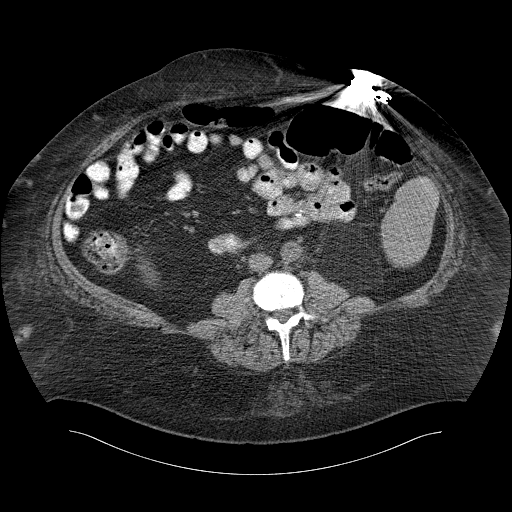
[im 64/106  soft-tissue]
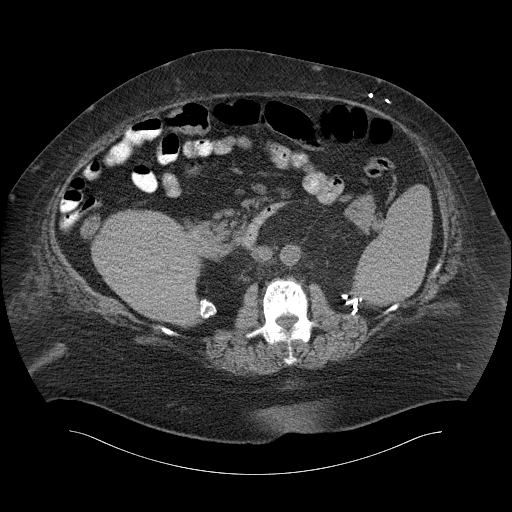
[im 74/106  soft-tissue]
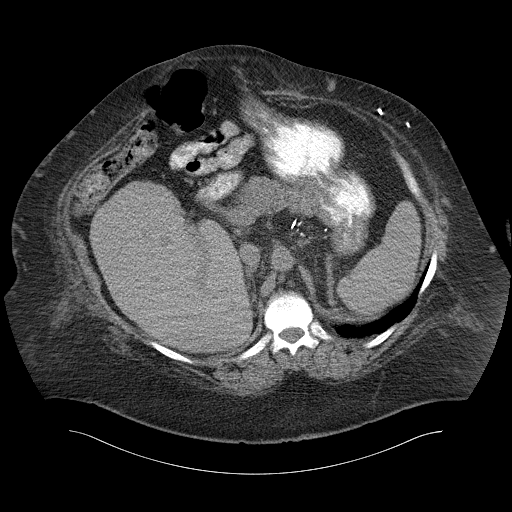
[im 74/106  bone]
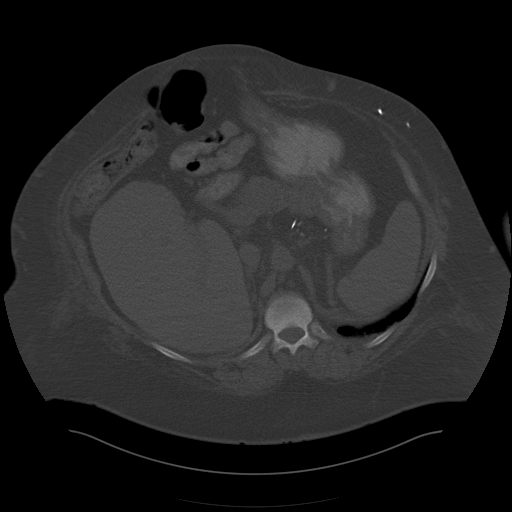
[im 83/106  soft-tissue]
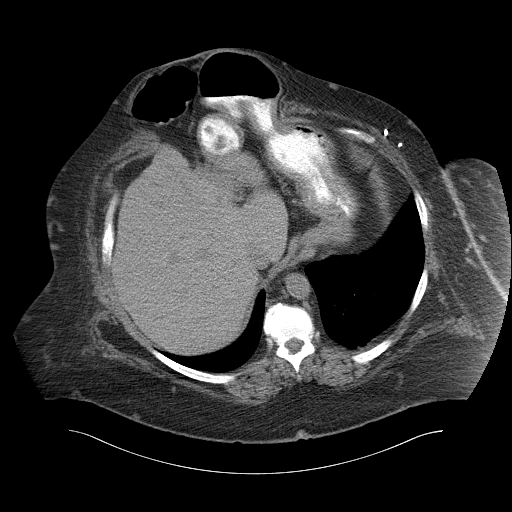
[im 92/106  soft-tissue]
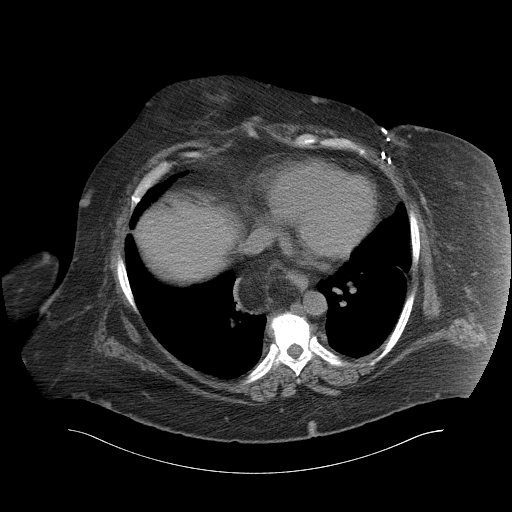
[im 101/106  soft-tissue]
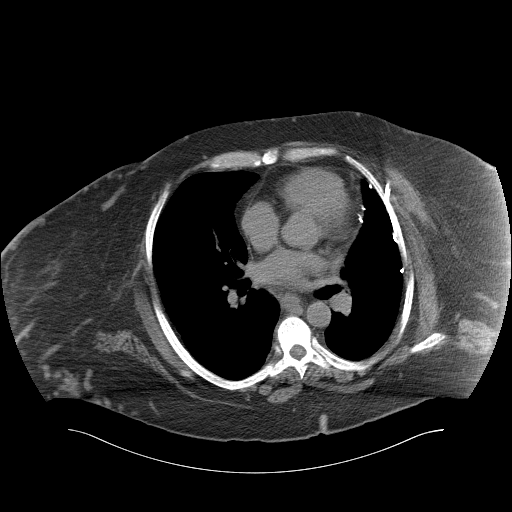

[Series 5: cor routine abd pel wo · coronal · 1.03mm/px · 3 of 189 slices shown]
[im 63/189  soft-tissue]
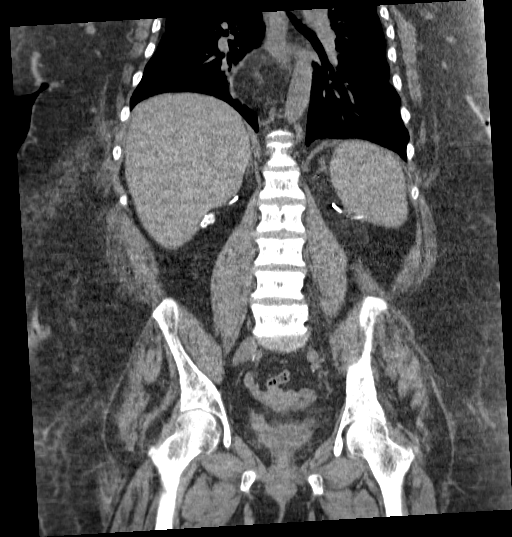
[im 84/189  soft-tissue]
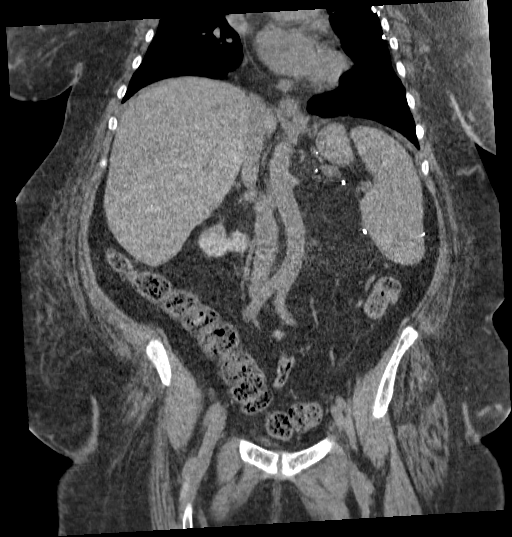
[im 105/189  soft-tissue]
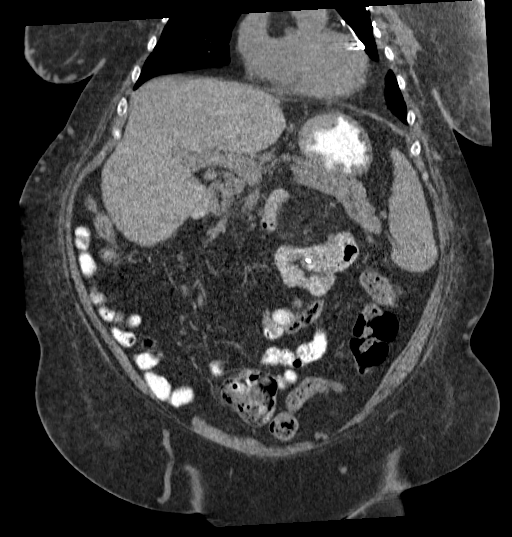

[15 of 46 positions shown; findings below may reference images not displayed]

FINDINGS: Lower chest: There are multiple small nodules at both lung bases.
Those that were imaged previously are grossly stable. However, the
current examination extends more superiorly and includes nodules
which were not previously seen. The largest is in the right lower
lobe, measuring 7 mm on image 6. The heart size is normal.
Epicardial pacing leads and fat within a hiatal hernia are noted.

Hepatobiliary: As imaged in the noncontrast state, the liver has a
stable appearance with stable low-density lesions including a small
lipoma in the left lobe on image number 23. Status post
cholecystectomy. No significant biliary dilatation.

Pancreas: Unremarkable. No pancreatic ductal dilatation or
surrounding inflammatory changes.

Spleen: Normal in size without focal abnormality.

Adrenals/Urinary Tract: Both adrenal glands appear stable. No normal
renal tissue demonstrated. There is a stable 1.7 cm calcification
within the right renal bed.There are postsurgical changes consistent
with prior left nephrectomy. The bladder is nearly empty but grossly
stable.

Stomach/Bowel: No evidence of bowel wall thickening, distention or
surrounding inflammatory change.Again demonstrated is a broad-based
hernia of the anterior abdominal wall inferior to the xiphoid
process. This contains the distal stomach and transverse colon.
There is no evidence of incarceration. Colonic stool burden appears
only mildly increased.

Vascular/Lymphatic: Possible left periaortic 9 mm node on image 56
is stable. No acute vascular findings are identified on non contrast
imaging. Vascular stent noted in the right groin.

Reproductive: Atrophied uterus.  No evidence of adnexal mass.

Other: As above, there is a hernia of the anterior abdominal wall
inferior to the xiphoid process containing the distal stomach and
transverse colon. There are numerous other anterior abdominal wall
hernias primarily containing fat. These are grossly stable. There
are apparent postsurgical changes posteriorly in the left breast.
Collateral vessels are present within the anterior abdominal wall.

Musculoskeletal: Generalized osteosclerosis is again noted,
presumably secondary to renal osteodystrophy fusion. No lytic lesion
or acute osseous finding identified.
IMPRESSION: 1. No acute findings or explanation for the patient's symptoms.
Colonic stool burden is only mildly increased.
2. Multiple small nodules at both lung bases. Some of these were not
previously imaged. The image nodules are stable from prior CT of 14
months ago.
3. Multiple anterior abdominal wall hernias. The stomach and
transverse colon extending into the most superior one. No evidence
of bowel incarceration or obstruction.
4. No functional renal tissue identified. Diffuse changes of
presumed renal osteodystrophy.

## 2016-12-16 IMAGING — CR DG CHEST 1V PORT
1 series · 1 of 1 positions shown · non-contrast
Comparison: 10/20/2014

CLINICAL DATA: Shortness of breath.  Chest pain.

EXAM:
PORTABLE CHEST - 1 VIEW

[AP]
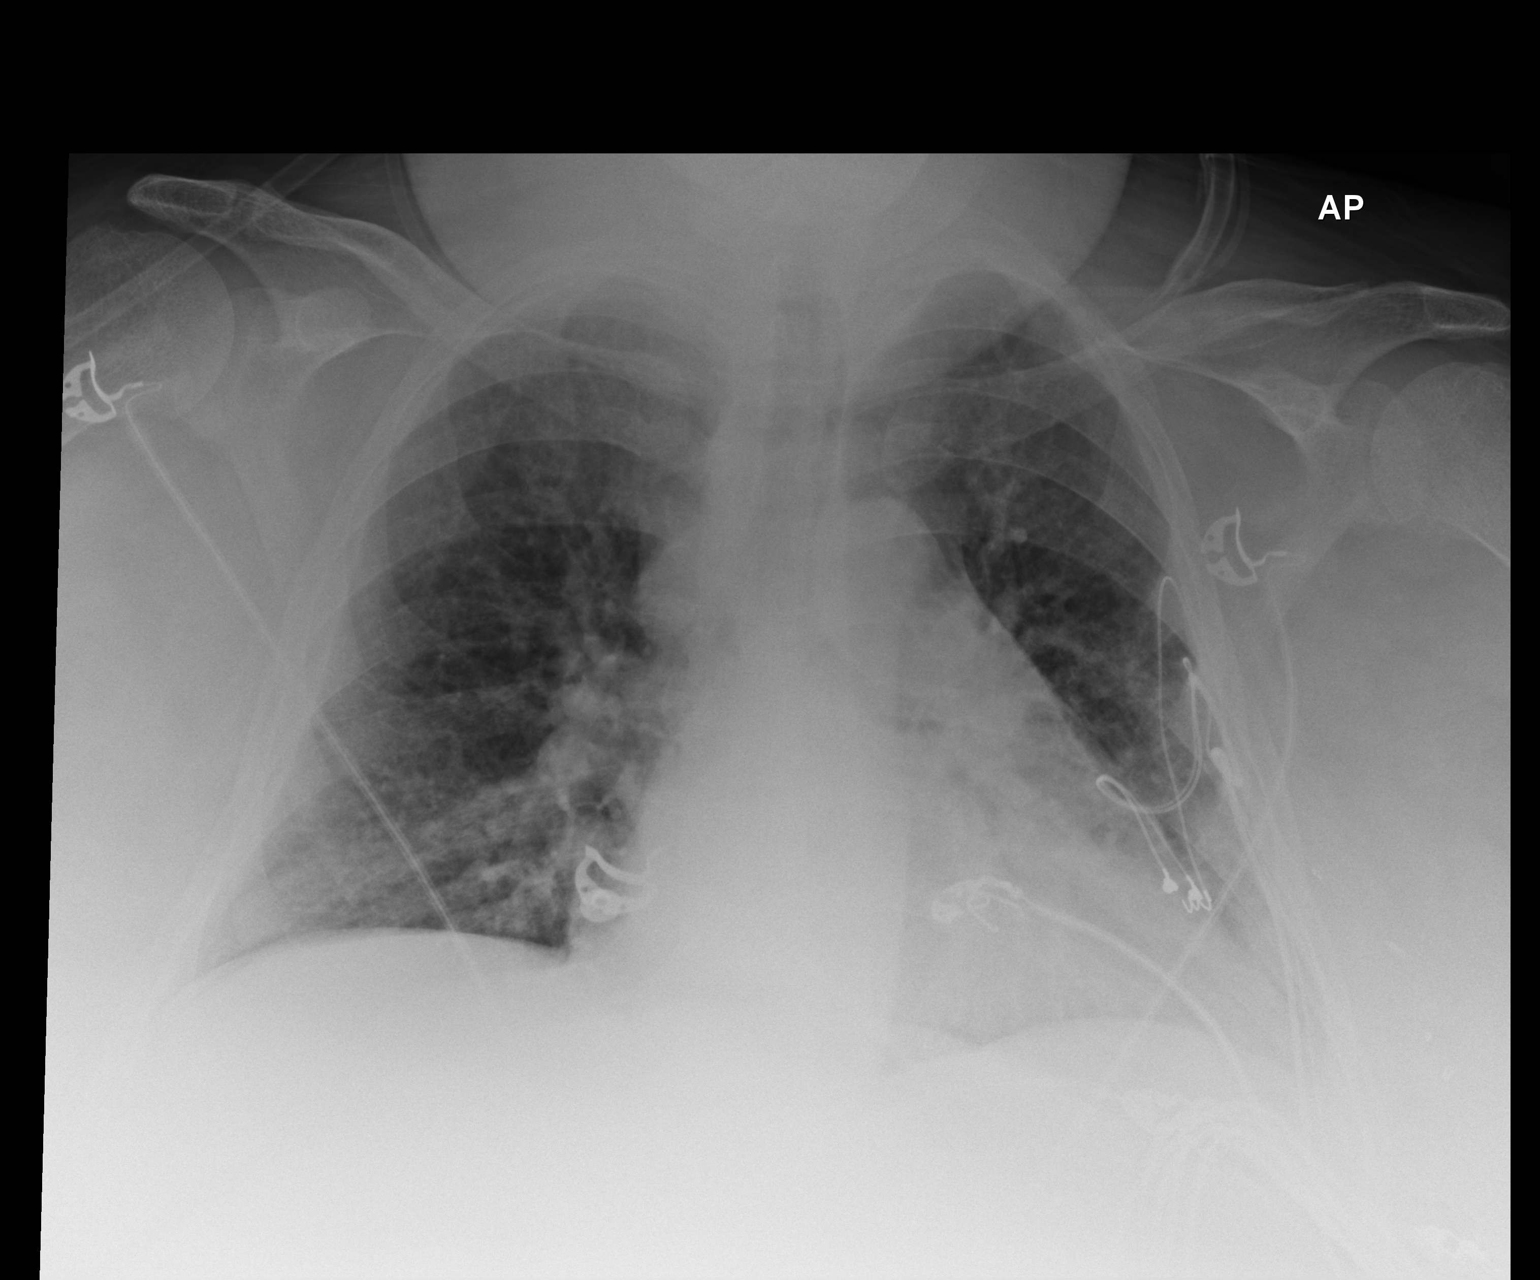

[1 of 1 positions shown; findings below may reference images not displayed]

FINDINGS: Mild enlargement of the cardiopericardial silhouette with pulmonary
venous hypertension and indistinct pulmonary vasculature. Mild
airway thickening centrally. No airspace opacity identified. Faint
peripheral interstitial edema is suggested.
IMPRESSION: 1. Cardiomegaly with indistinct pulmonary vasculature and faint
Kerley B-lines suggesting interstitial edema.

## 2016-12-16 IMAGING — CT CT ABD-PELV W/O CM
2 of 4 series · 16 of 46 positions shown, 18 images · non-contrast
Comparison: CT 10/21/2014

CLINICAL DATA: Abdominal pain.  Chest pain.  Sepsis.

EXAM:
CT ABDOMEN AND PELVIS WITHOUT CONTRAST
TECHNIQUE: Multidetector CT imaging of the abdomen and pelvis was performed
following the standard protocol without IV contrast.

[Series 2: abd/ pelvis 5.0 i30f 1 · axial · 0.84mm/px · z∈[-608,-128]mm · 13 of 106 slices shown, 15 images]
[im 5/106  soft-tissue]
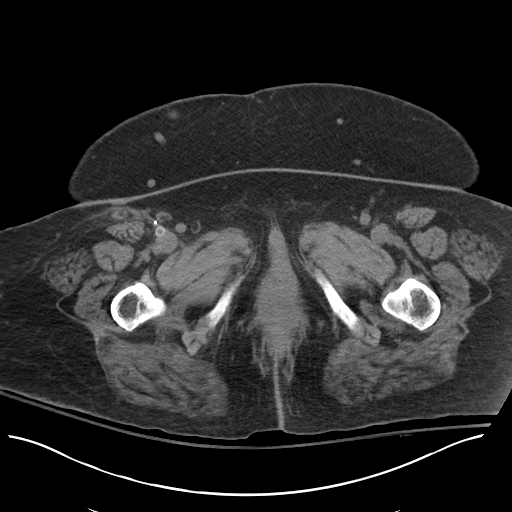
[im 5/106  bone]
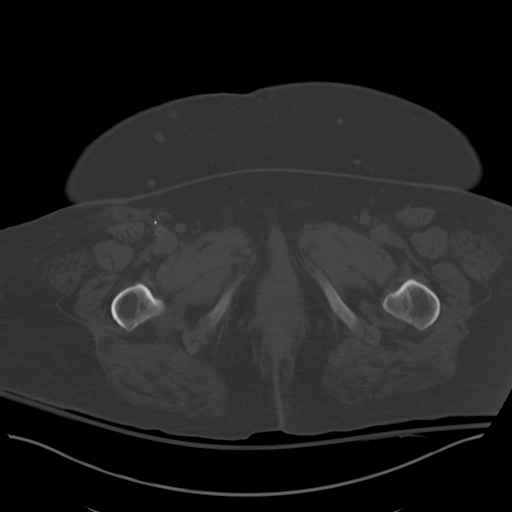
[im 14/106  soft-tissue]
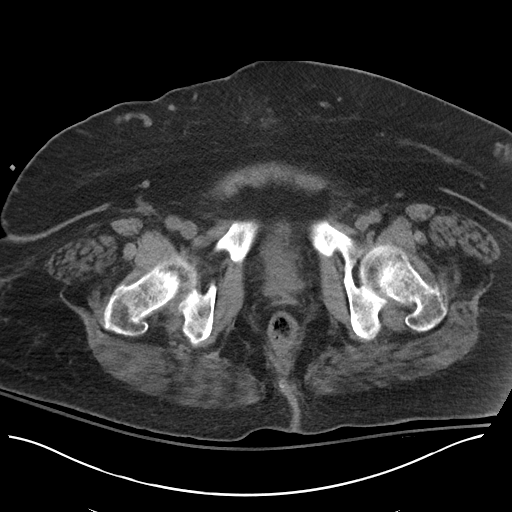
[im 22/106  soft-tissue]
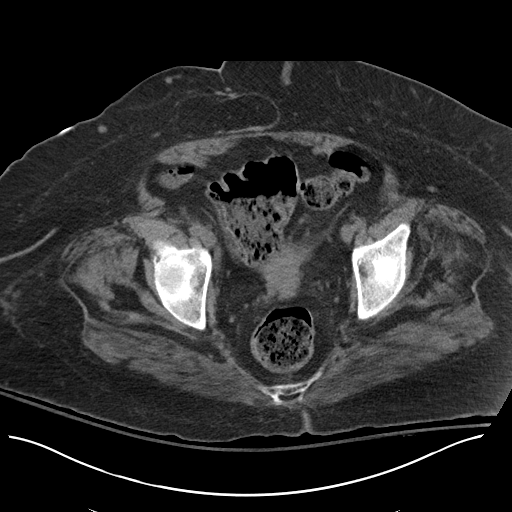
[im 31/106  soft-tissue]
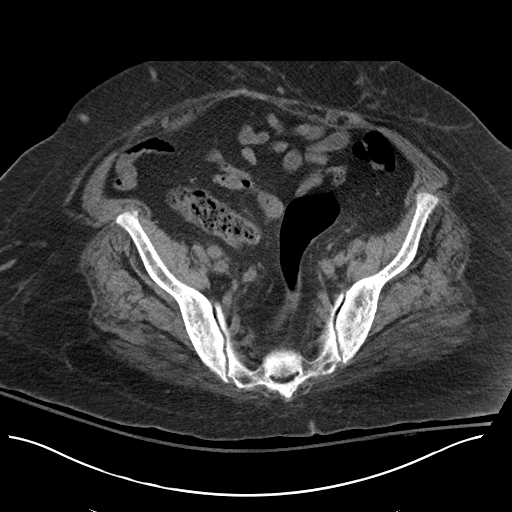
[im 36/106  soft-tissue]
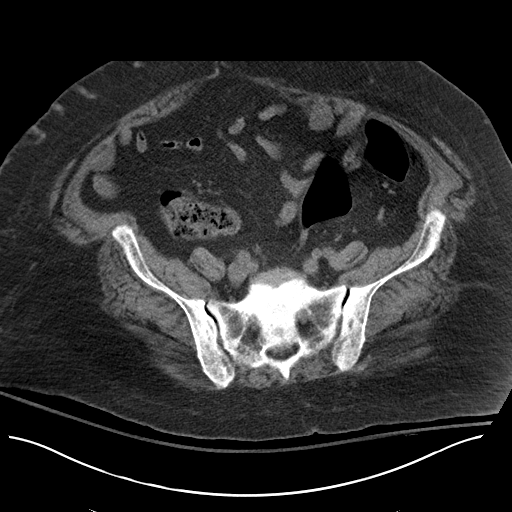
[im 44/106  soft-tissue]
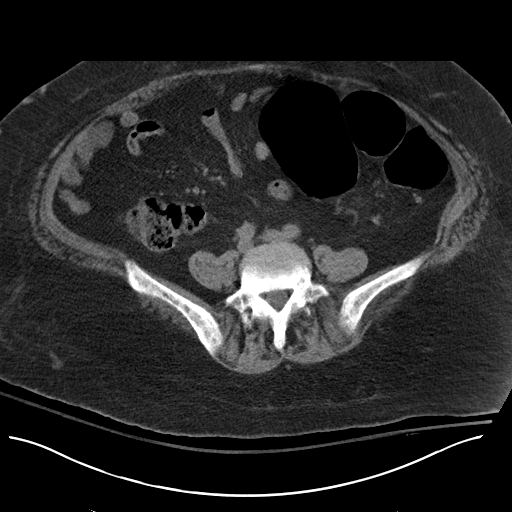
[im 53/106  soft-tissue]
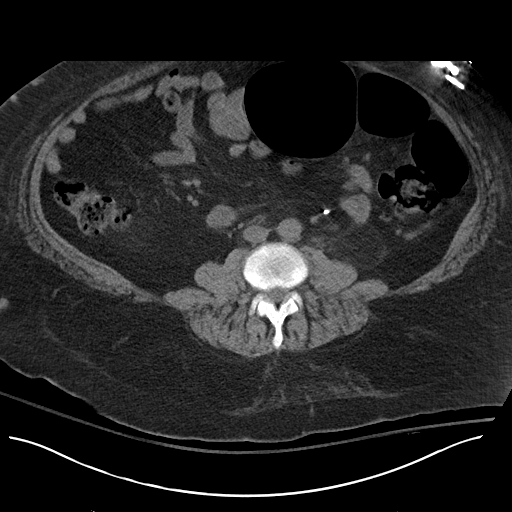
[im 62/106  soft-tissue]
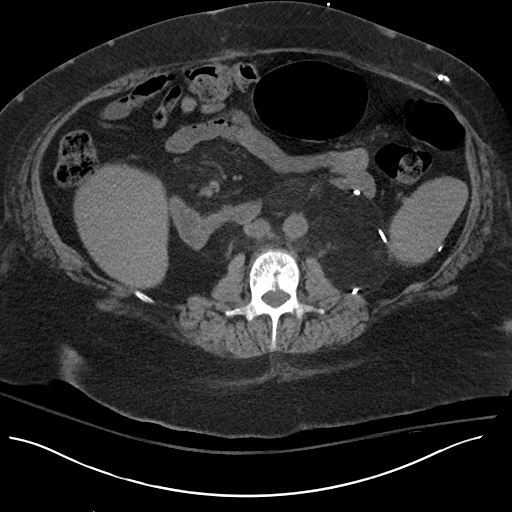
[im 71/106  soft-tissue]
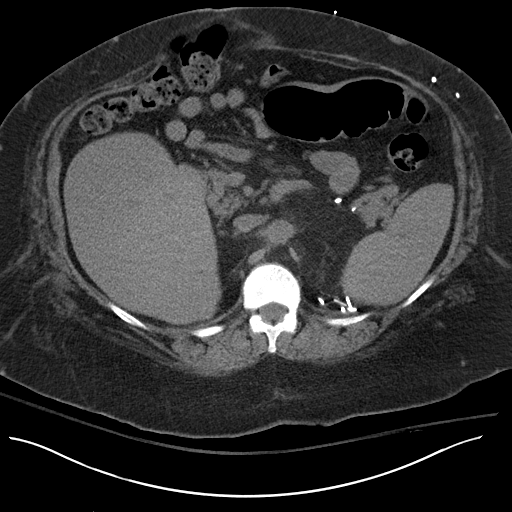
[im 71/106  bone]
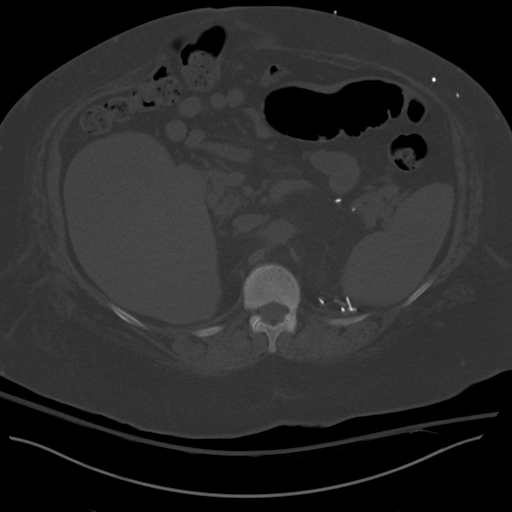
[im 75/106  soft-tissue]
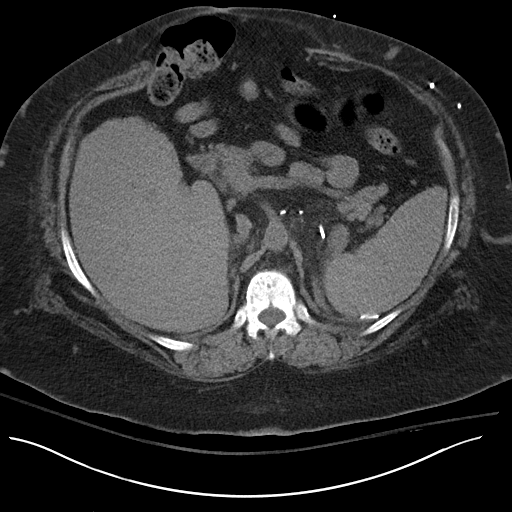
[im 84/106  soft-tissue]
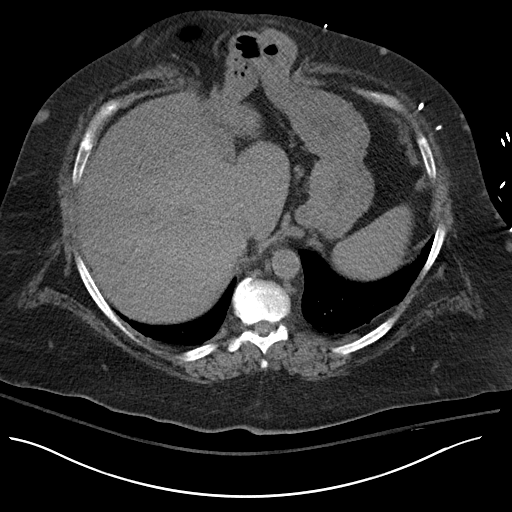
[im 92/106  soft-tissue]
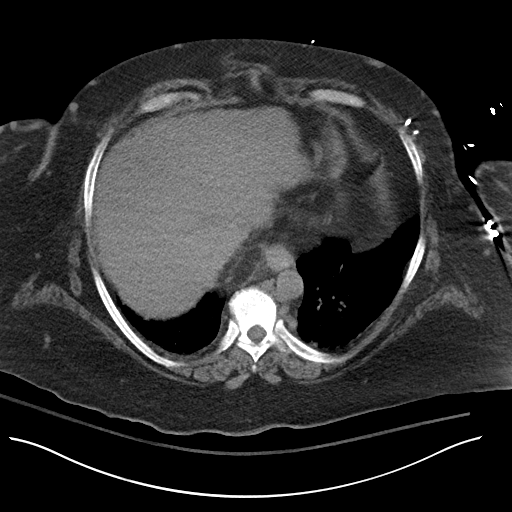
[im 101/106  soft-tissue]
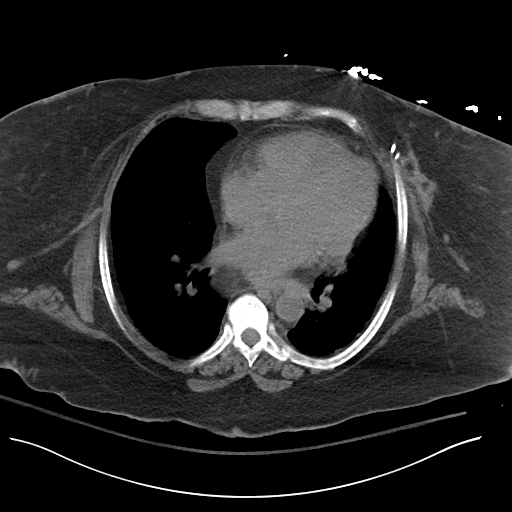

[Series 5: cor st · coronal · 1.00mm/px · 3 of 113 slices shown]
[im 38/113  soft-tissue]
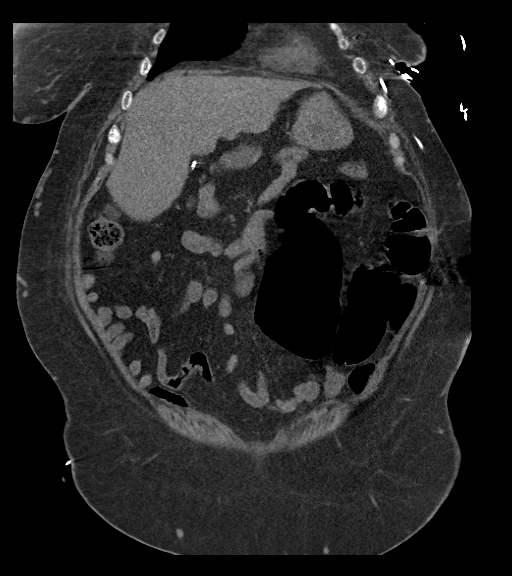
[im 50/113  soft-tissue]
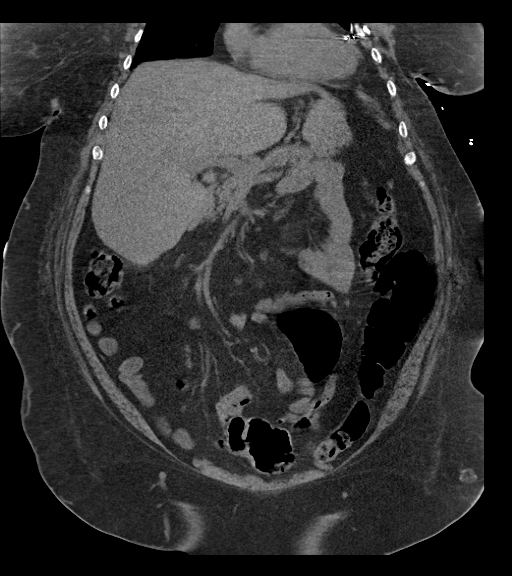
[im 63/113  soft-tissue]
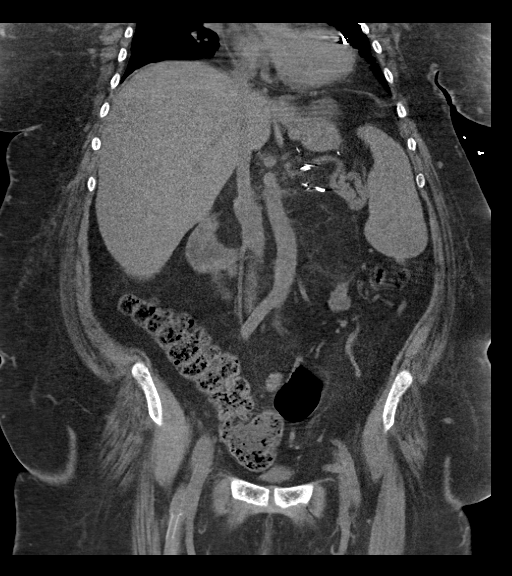

[16 of 46 positions shown; findings below may reference images not displayed]

FINDINGS: Unchanged 7 mm subpleural nodule in the right lower lobe. Additional
tiny nodules are either unchanged or obscured by breathing motion.
Minimal linear atelectasis in the left lower lobe. Cardiac pacemaker
with battery pack in the lower abdominal wall. The heart remains
enlarged, there is stable widening of the esophageal diaphragmatic
hiatus with herniation of intra-abdominal fat.

Clips in the gallbladder fossa from cholecystectomy. No biliary
dilatation. Hypo attenuating lesion in the subcapsular right hepatic
lobe and left lobe are unchanged from prior. No new hepatic lesions.

Spleen appears prominent measuring 14.6 cm in craniocaudal
dimension, unchanged from prior. No peripancreatic inflammatory
change or ductal dilatation. Adrenal glands are unchanged.

Normal kidneys are not identified. Unchanged 1.7 cm calcification in
the right renal bed. Surgical clips post left nephrectomy, left
nephrectomy bed unchanged in appearance with fat containing minimal
striations.

Limited bowel assessment given lack of contrast. The stomach is
decompressed. There are no dilated or thickened small bowel loops.
There is herniation of colon and stomach within an upper abdominal
ventral hernia. No signs of incarceration. Moderate stool throughout
the colon without colonic wall thickening. There is gaseous
distension of tortuous sigmoid colon, no obstruction. The appendix
is not definitively identified.

Within the pelvis the bladder is completely decompressed. Uterus is
atrophic, normal for age. No adnexal mass. No pelvic adenopathy.

Unchanged soft tissue density measuring 9 mm to the left of the
aorta, possible retroperitoneal node. Abdominal aorta is normal in
caliber.

Multiple fat containing ventral abdominal wall hernias containing
only fat, in addition to the upper abdominal hernia containing
transverse colon and stomach.

Unchanged appearance of the osseous structures with diffusely
increased density consistent with renal osteodystrophy. No acute
osseous abnormalities seen.
IMPRESSION: 1. No acute abnormality in the abdomen/pelvis. There is no
significant change from prior exam.
2. Multiple chronic findings as described above, stable from prior.

## 2016-12-19 IMAGING — CT CT ANGIO EXTREM LOW*R*
2 of 8 series · 2 of 35 positions shown · IV contrast (omnipaque)
Comparison: 09/06/2014

CLINICAL DATA: End-stage renal disease, right femoral thigh AV
graft, cellulitis over the venous limb of the graft.

EXAM:
CT ANGIOGRAPHY OF THE RIGHT LOWEREXTREMITY
TECHNIQUE: Multidetector CT imaging of the RIGHT LOWER EXTREMITYwas performed
using the standard protocol during bolus administration of
intravenous contrast. Multiplanar CT image reconstructions and MIPs
were obtained to evaluate the vascular anatomy.
CONTRAST:  100mL OMNIPAQUE IOHEXOL 350 MG/ML SOLN

[Series 400: locator · axial · 0.68mm/px · 1 of 1 slices shown]
[im 1/1  soft-tissue]
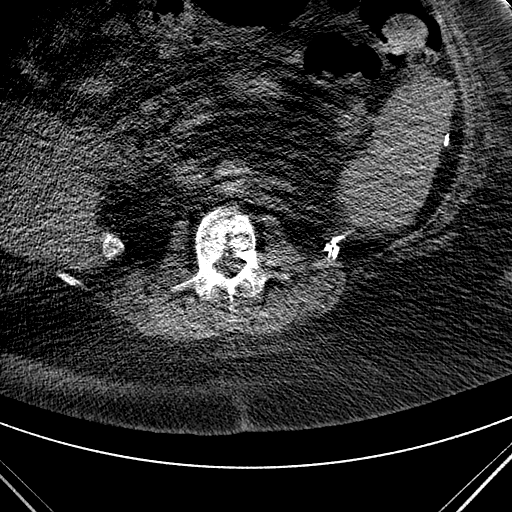

[Series 707: sag · sagittal · 0.72mm/px · 1 of 130 slices shown]
[im 88/130  soft-tissue]
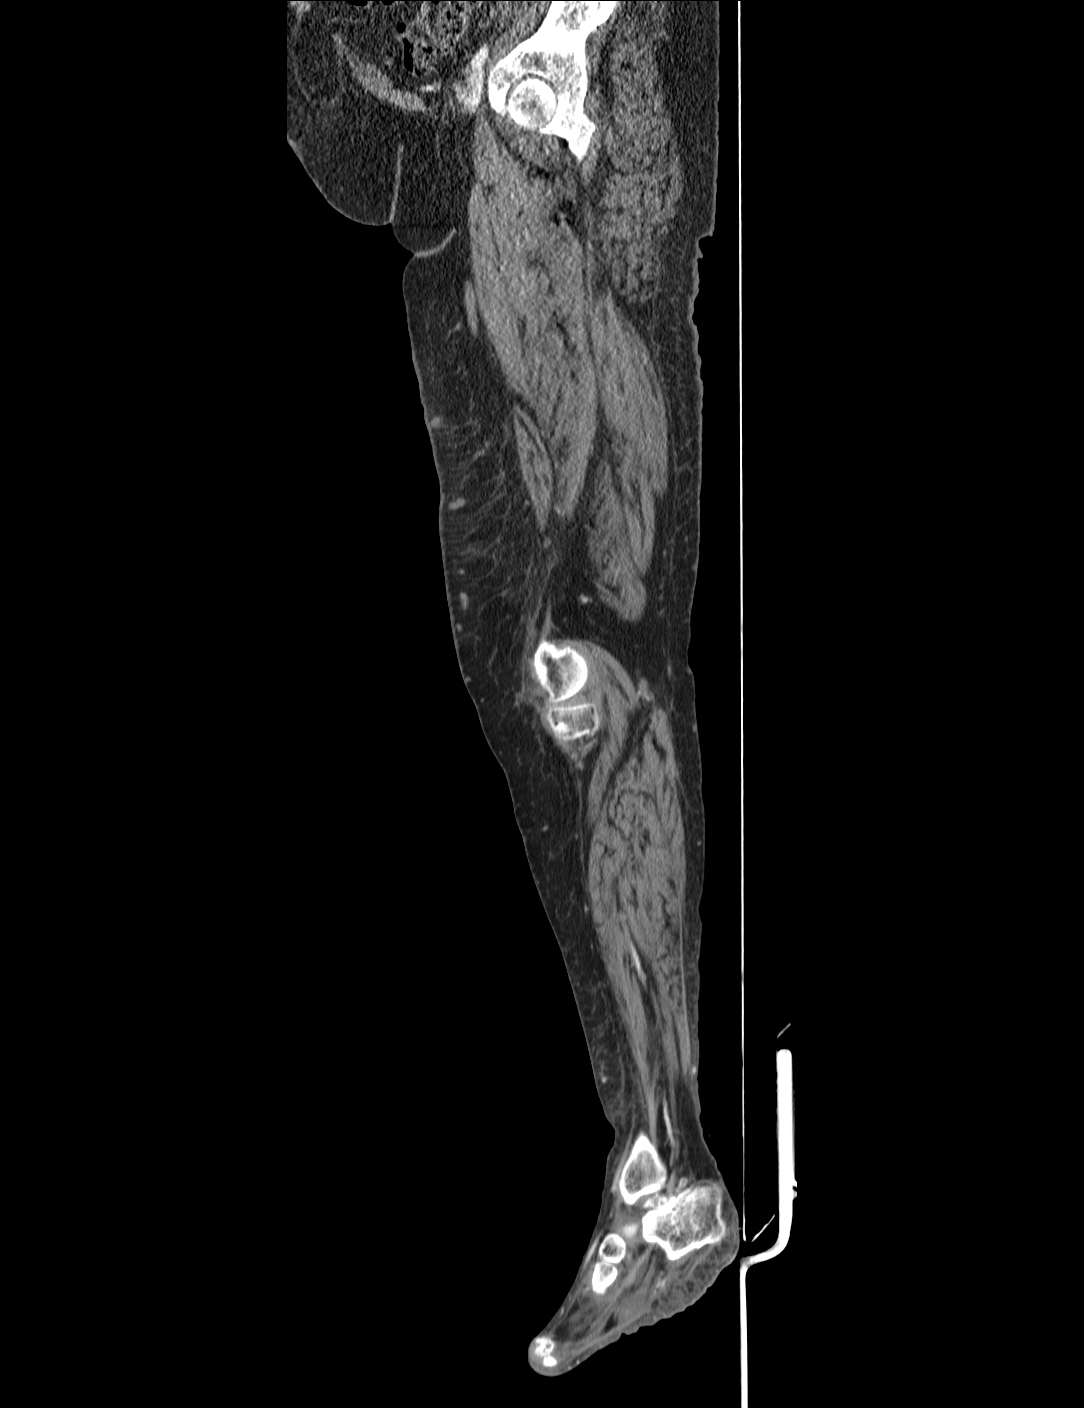

[2 of 35 positions shown; findings below may reference images not displayed]

FINDINGS: CTA RIGHT LOWER EXTREMITY: Beginning in the pelvis, the right
external iliac artery and vein remain patent.

The right common femoral, profunda femoral, and superficial femoral
arteries are patent. Right popliteal artery is patent across the
knee. Below-knee popliteal artery remains patent with small caliber
but three-vessel runoff preserved to the right lower extremity.

Right femoral AV loop graft containing several stents remains
patent. Arterial anastomosis to the proximal superficial femoral
artery is patent. Loop graft remains patent with some intraluminal
irregularity. Stented venous anastomosis remains patent to the
femoral vein. There is proximal thigh subcutaneous edema and some
minor skin thickening of the right medial thigh adjacent to the
venous limb of the graft but no perigraft fluid collection or soft
tissue abscess appreciated. More inferiorly, there is strandy
subcutaneous edema throughout the knee and lower extremity. Small
knee effusion noted.

Included views of the right lower quadrant demonstrate a right lower
abdominal wall ventral hernia containing only fat. Diverticulosis
noted of the visualized the colon. Patient also has a left femoral
central line in place.

Review of the MIP images confirms the above findings.
IMPRESSION: Patent right femoral AV loop graft containing several intragraft
stents without evidence of thrombosis or occlusion. Proximal right
thigh diffuse strandy edema and skin thickening medially adjacent to
the venous limb of the graft compatible with cellulitis.

No perigraft or proximal thigh significant fluid collection or
abscess.

Patent right lower extremity peripheral vasculature.

## 2017-02-17 IMAGING — CR DG CHEST 1V
1 series · 1 of 1 positions shown · non-contrast
Comparison: 12/16/2014

CLINICAL DATA: Diarrhea.  Incontinence.  Dyspnea.

EXAM:
CHEST  1 VIEW

[ap]
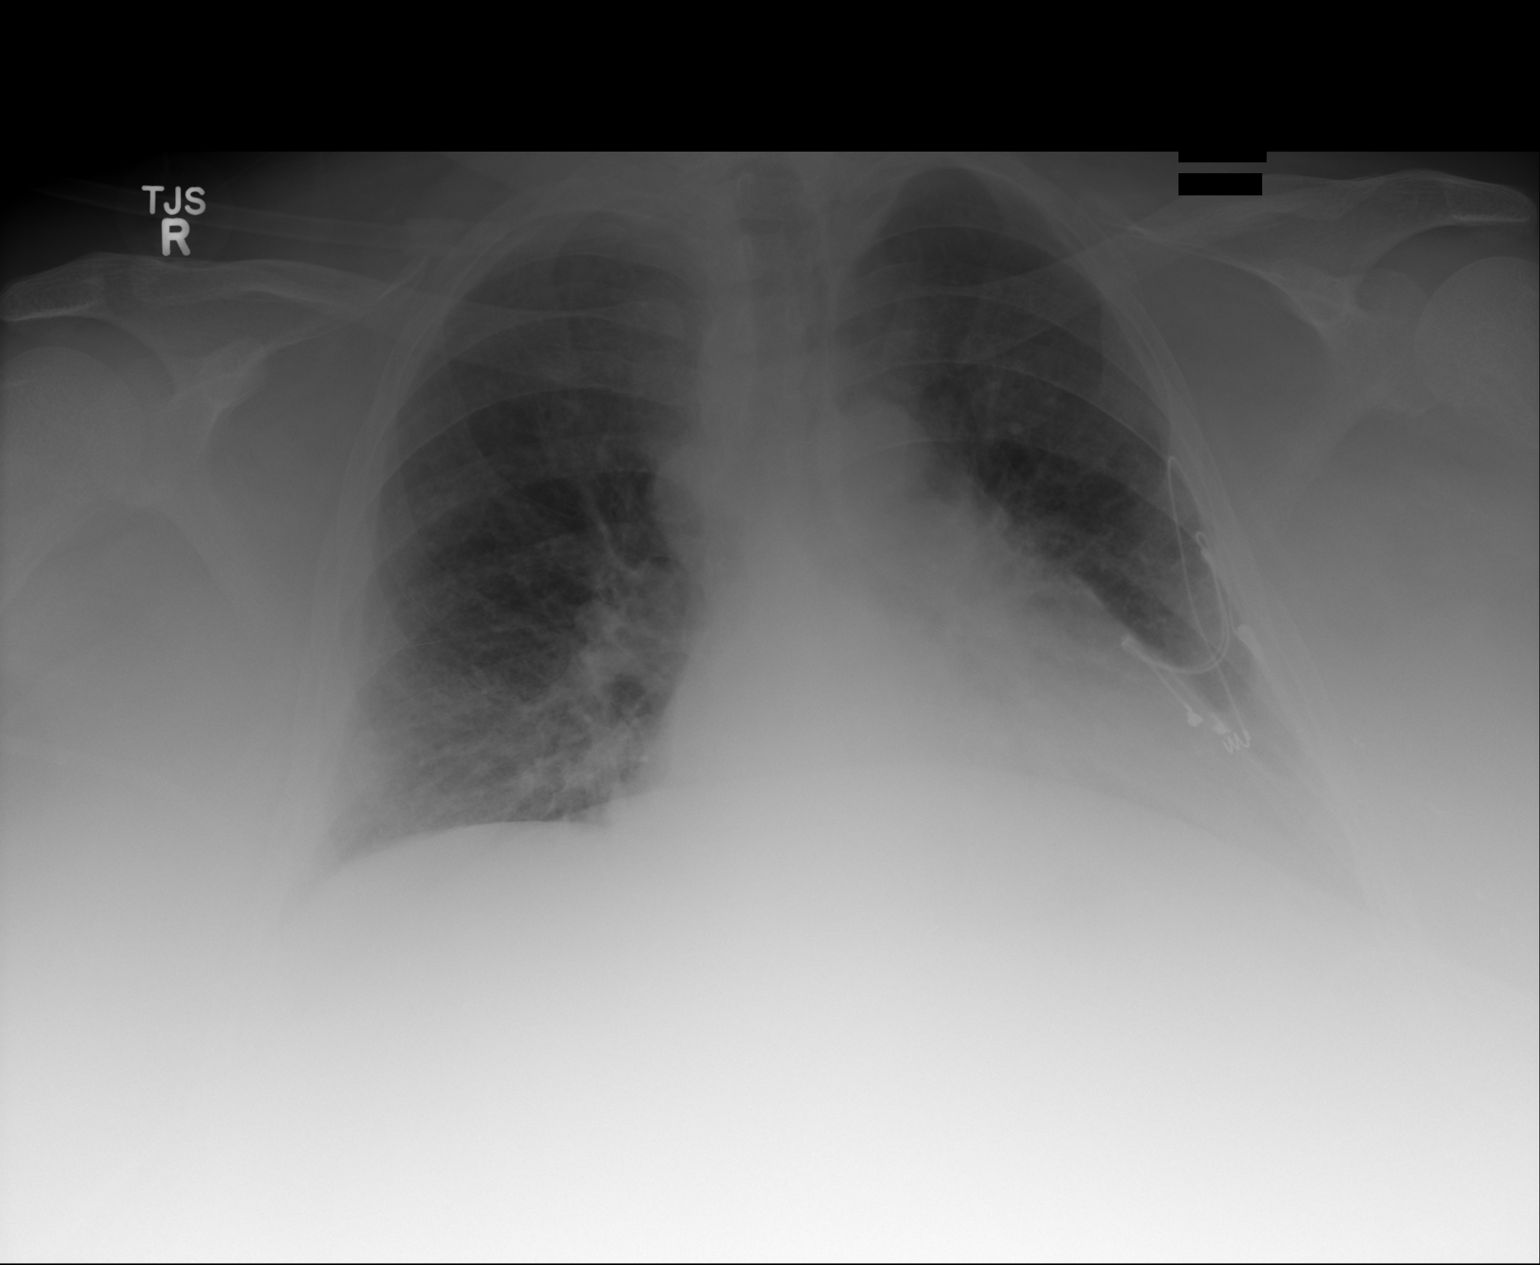

[1 of 1 positions shown; findings below may reference images not displayed]

FINDINGS: There is moderate unchanged cardiomegaly. No large effusion. No
focal airspace consolidation. Pulmonary vasculature is normal.
IMPRESSION: Cardiomegaly.  No acute findings.
